# Patient Record
Sex: Male | Born: 1950 | Race: White | Hispanic: No | Marital: Married | Smoking: Current every day smoker
Health system: Southern US, Community
[De-identification: ages and names within clinical notes are randomized; demographics above are authoritative.]

## PROBLEM LIST (undated history)

## (undated) DIAGNOSIS — F32A Depression, unspecified: Secondary | ICD-10-CM

## (undated) DIAGNOSIS — C801 Malignant (primary) neoplasm, unspecified: Secondary | ICD-10-CM

## (undated) DIAGNOSIS — R0602 Shortness of breath: Secondary | ICD-10-CM

## (undated) DIAGNOSIS — D380 Neoplasm of uncertain behavior of larynx: Secondary | ICD-10-CM

## (undated) DIAGNOSIS — E119 Type 2 diabetes mellitus without complications: Secondary | ICD-10-CM

## (undated) DIAGNOSIS — R918 Other nonspecific abnormal finding of lung field: Secondary | ICD-10-CM

## (undated) DIAGNOSIS — T7840XA Allergy, unspecified, initial encounter: Secondary | ICD-10-CM

## (undated) DIAGNOSIS — I10 Essential (primary) hypertension: Secondary | ICD-10-CM

## (undated) DIAGNOSIS — I219 Acute myocardial infarction, unspecified: Secondary | ICD-10-CM

## (undated) DIAGNOSIS — K219 Gastro-esophageal reflux disease without esophagitis: Secondary | ICD-10-CM

## (undated) DIAGNOSIS — G4730 Sleep apnea, unspecified: Secondary | ICD-10-CM

## (undated) DIAGNOSIS — M1990 Unspecified osteoarthritis, unspecified site: Secondary | ICD-10-CM

## (undated) DIAGNOSIS — Z8719 Personal history of other diseases of the digestive system: Secondary | ICD-10-CM

## (undated) DIAGNOSIS — N189 Chronic kidney disease, unspecified: Secondary | ICD-10-CM

## (undated) DIAGNOSIS — G629 Polyneuropathy, unspecified: Secondary | ICD-10-CM

## (undated) DIAGNOSIS — R55 Syncope and collapse: Secondary | ICD-10-CM

## (undated) DIAGNOSIS — Z8489 Family history of other specified conditions: Secondary | ICD-10-CM

## (undated) DIAGNOSIS — R058 Other specified cough: Secondary | ICD-10-CM

## (undated) DIAGNOSIS — E215 Disorder of parathyroid gland, unspecified: Secondary | ICD-10-CM

## (undated) DIAGNOSIS — I209 Angina pectoris, unspecified: Secondary | ICD-10-CM

## (undated) DIAGNOSIS — E669 Obesity, unspecified: Secondary | ICD-10-CM

## (undated) DIAGNOSIS — M549 Dorsalgia, unspecified: Secondary | ICD-10-CM

## (undated) DIAGNOSIS — J40 Bronchitis, not specified as acute or chronic: Secondary | ICD-10-CM

## (undated) DIAGNOSIS — F329 Major depressive disorder, single episode, unspecified: Secondary | ICD-10-CM

## (undated) DIAGNOSIS — G4761 Periodic limb movement disorder: Secondary | ICD-10-CM

## (undated) DIAGNOSIS — D649 Anemia, unspecified: Secondary | ICD-10-CM

## (undated) DIAGNOSIS — J189 Pneumonia, unspecified organism: Secondary | ICD-10-CM

## (undated) DIAGNOSIS — J449 Chronic obstructive pulmonary disease, unspecified: Secondary | ICD-10-CM

## (undated) DIAGNOSIS — G4700 Insomnia, unspecified: Secondary | ICD-10-CM

## (undated) DIAGNOSIS — Z5189 Encounter for other specified aftercare: Secondary | ICD-10-CM

## (undated) DIAGNOSIS — E079 Disorder of thyroid, unspecified: Secondary | ICD-10-CM

## (undated) DIAGNOSIS — I2510 Atherosclerotic heart disease of native coronary artery without angina pectoris: Secondary | ICD-10-CM

## (undated) DIAGNOSIS — IMO0001 Reserved for inherently not codable concepts without codable children: Secondary | ICD-10-CM

## (undated) DIAGNOSIS — R05 Cough: Secondary | ICD-10-CM

## (undated) HISTORY — DX: Sleep apnea, unspecified: G47.30

## (undated) HISTORY — PX: ESOPHAGOGASTRODUODENOSCOPY: SHX1529

## (undated) HISTORY — DX: Anemia, unspecified: D64.9

## (undated) HISTORY — PX: CARPAL TUNNEL RELEASE: SHX101

## (undated) HISTORY — DX: Neoplasm of uncertain behavior of larynx: D38.0

## (undated) HISTORY — DX: Gastro-esophageal reflux disease without esophagitis: K21.9

## (undated) HISTORY — DX: Essential (primary) hypertension: I10

## (undated) HISTORY — DX: Malignant (primary) neoplasm, unspecified: C80.1

## (undated) HISTORY — PX: CORONARY ANGIOPLASTY WITH STENT PLACEMENT: SHX49

## (undated) HISTORY — DX: Major depressive disorder, single episode, unspecified: F32.9

## (undated) HISTORY — PX: ELBOW SURGERY: SHX618

## (undated) HISTORY — DX: Allergy, unspecified, initial encounter: T78.40XA

## (undated) HISTORY — DX: Periodic limb movement disorder: G47.61

## (undated) HISTORY — DX: Obesity, unspecified: E66.9

## (undated) HISTORY — PX: ROTATOR CUFF REPAIR: SHX139

## (undated) HISTORY — DX: Syncope and collapse: R55

## (undated) HISTORY — PX: EYE SURGERY: SHX253

## (undated) HISTORY — PX: INGUINAL HERNIA REPAIR: SUR1180

## (undated) HISTORY — DX: Depression, unspecified: F32.A

## (undated) HISTORY — PX: REFRACTIVE SURGERY: SHX103

## (undated) HISTORY — PX: COLONOSCOPY: SHX174

## (undated) HISTORY — DX: Chronic obstructive pulmonary disease, unspecified: J44.9

## (undated) HISTORY — PX: CARDIAC CATHETERIZATION: SHX172

---

## 2000-12-06 ENCOUNTER — Encounter: Admission: RE | Admit: 2000-12-06 | Discharge: 2000-12-06 | Payer: Self-pay | Admitting: Family Medicine

## 2000-12-06 ENCOUNTER — Encounter: Payer: Self-pay | Admitting: Family Medicine

## 2001-11-10 ENCOUNTER — Encounter: Payer: Self-pay | Admitting: Family Medicine

## 2001-11-10 ENCOUNTER — Encounter: Admission: RE | Admit: 2001-11-10 | Discharge: 2001-11-10 | Payer: Self-pay | Admitting: Family Medicine

## 2003-11-18 ENCOUNTER — Encounter: Admission: RE | Admit: 2003-11-18 | Discharge: 2003-11-18 | Payer: Self-pay | Admitting: Family Medicine

## 2004-05-30 ENCOUNTER — Ambulatory Visit (HOSPITAL_BASED_OUTPATIENT_CLINIC_OR_DEPARTMENT_OTHER): Admission: RE | Admit: 2004-05-30 | Discharge: 2004-05-30 | Payer: Self-pay | Admitting: Orthopedic Surgery

## 2004-05-30 ENCOUNTER — Ambulatory Visit (HOSPITAL_COMMUNITY): Admission: RE | Admit: 2004-05-30 | Discharge: 2004-05-30 | Payer: Self-pay | Admitting: Orthopedic Surgery

## 2004-08-02 ENCOUNTER — Encounter: Admission: RE | Admit: 2004-08-02 | Discharge: 2004-08-02 | Payer: Self-pay | Admitting: Family Medicine

## 2004-08-18 ENCOUNTER — Encounter: Admission: RE | Admit: 2004-08-18 | Discharge: 2004-08-18 | Payer: Self-pay | Admitting: Family Medicine

## 2004-08-21 ENCOUNTER — Ambulatory Visit (HOSPITAL_BASED_OUTPATIENT_CLINIC_OR_DEPARTMENT_OTHER): Admission: RE | Admit: 2004-08-21 | Discharge: 2004-08-21 | Payer: Self-pay | Admitting: Family Medicine

## 2004-09-05 ENCOUNTER — Observation Stay (HOSPITAL_COMMUNITY): Admission: EM | Admit: 2004-09-05 | Discharge: 2004-09-07 | Payer: Self-pay | Admitting: Emergency Medicine

## 2004-12-21 ENCOUNTER — Ambulatory Visit (HOSPITAL_COMMUNITY): Admission: RE | Admit: 2004-12-21 | Discharge: 2004-12-22 | Payer: Self-pay | Admitting: Interventional Cardiology

## 2005-06-14 ENCOUNTER — Encounter (INDEPENDENT_AMBULATORY_CARE_PROVIDER_SITE_OTHER): Payer: Self-pay | Admitting: Specialist

## 2005-06-14 ENCOUNTER — Ambulatory Visit (HOSPITAL_COMMUNITY): Admission: RE | Admit: 2005-06-14 | Discharge: 2005-06-14 | Payer: Self-pay | Admitting: Gastroenterology

## 2005-07-18 ENCOUNTER — Encounter: Admission: RE | Admit: 2005-07-18 | Discharge: 2005-07-18 | Payer: Self-pay | Admitting: Gastroenterology

## 2005-11-03 ENCOUNTER — Encounter: Admission: RE | Admit: 2005-11-03 | Discharge: 2005-11-03 | Payer: Self-pay | Admitting: Orthopedic Surgery

## 2007-04-11 ENCOUNTER — Ambulatory Visit: Payer: Self-pay | Admitting: Cardiology

## 2007-04-11 ENCOUNTER — Observation Stay (HOSPITAL_COMMUNITY): Admission: EM | Admit: 2007-04-11 | Discharge: 2007-04-11 | Payer: Self-pay | Admitting: Emergency Medicine

## 2007-06-16 ENCOUNTER — Emergency Department (HOSPITAL_COMMUNITY): Admission: EM | Admit: 2007-06-16 | Discharge: 2007-06-16 | Payer: Self-pay | Admitting: Emergency Medicine

## 2007-07-02 ENCOUNTER — Ambulatory Visit: Payer: Self-pay | Admitting: Internal Medicine

## 2007-07-02 ENCOUNTER — Encounter: Payer: Self-pay | Admitting: Critical Care Medicine

## 2007-07-11 ENCOUNTER — Encounter: Admission: RE | Admit: 2007-07-11 | Discharge: 2007-07-11 | Payer: Self-pay | Admitting: Gastroenterology

## 2007-07-22 ENCOUNTER — Ambulatory Visit (HOSPITAL_COMMUNITY): Admission: RE | Admit: 2007-07-22 | Discharge: 2007-07-22 | Payer: Self-pay | Admitting: Family Medicine

## 2007-07-31 ENCOUNTER — Ambulatory Visit: Payer: Self-pay | Admitting: Internal Medicine

## 2007-08-05 ENCOUNTER — Ambulatory Visit: Payer: Self-pay | Admitting: Cardiology

## 2007-08-12 ENCOUNTER — Ambulatory Visit: Payer: Self-pay | Admitting: Internal Medicine

## 2007-08-22 DIAGNOSIS — D360 Benign neoplasm of lymph nodes: Secondary | ICD-10-CM

## 2007-08-22 DIAGNOSIS — R0602 Shortness of breath: Secondary | ICD-10-CM | POA: Insufficient documentation

## 2007-08-22 DIAGNOSIS — J449 Chronic obstructive pulmonary disease, unspecified: Secondary | ICD-10-CM

## 2007-09-25 DIAGNOSIS — R42 Dizziness and giddiness: Secondary | ICD-10-CM

## 2007-09-25 DIAGNOSIS — F17200 Nicotine dependence, unspecified, uncomplicated: Secondary | ICD-10-CM

## 2007-11-13 ENCOUNTER — Inpatient Hospital Stay (HOSPITAL_COMMUNITY): Admission: EM | Admit: 2007-11-13 | Discharge: 2007-11-15 | Payer: Self-pay | Admitting: Emergency Medicine

## 2008-06-14 ENCOUNTER — Observation Stay (HOSPITAL_COMMUNITY): Admission: EM | Admit: 2008-06-14 | Discharge: 2008-06-15 | Payer: Self-pay | Admitting: Emergency Medicine

## 2008-06-14 ENCOUNTER — Ambulatory Visit: Payer: Self-pay | Admitting: Internal Medicine

## 2008-12-19 ENCOUNTER — Inpatient Hospital Stay (HOSPITAL_COMMUNITY): Admission: EM | Admit: 2008-12-19 | Discharge: 2008-12-20 | Payer: Self-pay | Admitting: Emergency Medicine

## 2008-12-19 ENCOUNTER — Ambulatory Visit: Payer: Self-pay | Admitting: Internal Medicine

## 2008-12-20 ENCOUNTER — Encounter (INDEPENDENT_AMBULATORY_CARE_PROVIDER_SITE_OTHER): Payer: Self-pay | Admitting: Interventional Cardiology

## 2009-01-05 ENCOUNTER — Inpatient Hospital Stay (HOSPITAL_COMMUNITY): Admission: EM | Admit: 2009-01-05 | Discharge: 2009-01-08 | Payer: Self-pay | Admitting: Emergency Medicine

## 2009-01-08 ENCOUNTER — Telehealth: Payer: Self-pay | Admitting: Internal Medicine

## 2009-01-11 ENCOUNTER — Telehealth (INDEPENDENT_AMBULATORY_CARE_PROVIDER_SITE_OTHER): Payer: Self-pay | Admitting: *Deleted

## 2009-02-01 ENCOUNTER — Emergency Department (HOSPITAL_COMMUNITY): Admission: EM | Admit: 2009-02-01 | Discharge: 2009-02-01 | Payer: Self-pay | Admitting: Emergency Medicine

## 2009-02-15 ENCOUNTER — Ambulatory Visit: Payer: Self-pay | Admitting: Internal Medicine

## 2009-02-15 DIAGNOSIS — J180 Bronchopneumonia, unspecified organism: Secondary | ICD-10-CM | POA: Insufficient documentation

## 2009-03-24 ENCOUNTER — Encounter (HOSPITAL_COMMUNITY): Admission: RE | Admit: 2009-03-24 | Discharge: 2009-06-22 | Payer: Self-pay | Admitting: Internal Medicine

## 2009-05-24 ENCOUNTER — Encounter: Admission: RE | Admit: 2009-05-24 | Discharge: 2009-05-24 | Payer: Self-pay | Admitting: Orthopedic Surgery

## 2009-06-23 ENCOUNTER — Encounter (HOSPITAL_COMMUNITY): Admission: RE | Admit: 2009-06-23 | Discharge: 2009-08-05 | Payer: Self-pay | Admitting: Internal Medicine

## 2009-07-19 ENCOUNTER — Ambulatory Visit: Payer: Self-pay | Admitting: Internal Medicine

## 2009-07-19 LAB — CONVERTED CEMR LAB
BUN: 14 mg/dL (ref 6–23)
Calcium: 9.1 mg/dL (ref 8.4–10.5)
Creatinine, Ser: 0.8 mg/dL (ref 0.4–1.5)
GFR calc non Af Amer: 105.04 mL/min (ref >60)
Glucose, Bld: 146 mg/dL — ABNORMAL HIGH (ref 70–99)

## 2009-07-26 ENCOUNTER — Ambulatory Visit: Payer: Self-pay | Admitting: Internal Medicine

## 2009-08-06 ENCOUNTER — Encounter (HOSPITAL_COMMUNITY): Admission: RE | Admit: 2009-08-06 | Discharge: 2009-11-04 | Payer: Self-pay | Admitting: Interventional Cardiology

## 2009-08-09 ENCOUNTER — Ambulatory Visit: Payer: Self-pay | Admitting: Internal Medicine

## 2009-08-17 ENCOUNTER — Telehealth (INDEPENDENT_AMBULATORY_CARE_PROVIDER_SITE_OTHER): Payer: Self-pay | Admitting: *Deleted

## 2009-11-29 ENCOUNTER — Ambulatory Visit: Payer: Self-pay | Admitting: Internal Medicine

## 2009-12-29 ENCOUNTER — Ambulatory Visit: Payer: Self-pay | Admitting: Internal Medicine

## 2010-02-10 ENCOUNTER — Ambulatory Visit: Payer: Self-pay | Admitting: Internal Medicine

## 2010-02-10 DIAGNOSIS — R1013 Epigastric pain: Secondary | ICD-10-CM | POA: Insufficient documentation

## 2010-03-03 ENCOUNTER — Ambulatory Visit: Payer: Self-pay | Admitting: Internal Medicine

## 2010-04-04 ENCOUNTER — Telehealth: Payer: Self-pay | Admitting: Internal Medicine

## 2010-05-15 ENCOUNTER — Ambulatory Visit: Payer: Self-pay | Admitting: Cardiology

## 2010-06-20 ENCOUNTER — Ambulatory Visit: Payer: Self-pay | Admitting: Internal Medicine

## 2010-06-26 ENCOUNTER — Encounter: Payer: Self-pay | Admitting: Internal Medicine

## 2010-07-07 ENCOUNTER — Encounter: Payer: Self-pay | Admitting: Internal Medicine

## 2010-11-16 ENCOUNTER — Observation Stay (HOSPITAL_COMMUNITY)
Admission: EM | Admit: 2010-11-16 | Discharge: 2010-11-19 | Payer: Self-pay | Source: Home / Self Care | Attending: Internal Medicine | Admitting: Internal Medicine

## 2010-11-20 LAB — POCT CARDIAC MARKERS
CKMB, poc: 1.8 ng/mL (ref 1.0–8.0)
CKMB, poc: 3.2 ng/mL (ref 1.0–8.0)
Myoglobin, poc: 230 ng/mL (ref 12–200)
Myoglobin, poc: 124 ng/mL (ref 12–200)
Troponin i, poc: <0.05 ng/mL (ref 0.00–0.09)
Troponin i, poc: <0.05 ng/mL (ref 0.00–0.09)

## 2010-11-20 LAB — COMPREHENSIVE METABOLIC PANEL
ALT: 21 U/L (ref 0–53)
AST: 29 U/L (ref 0–37)
Albumin: 3.6 g/dL (ref 3.5–5.2)
Alkaline Phosphatase: 64 U/L (ref 39–117)
BUN: 16 mg/dL (ref 6–23)
CO2: 20 mEq/L (ref 19–32)
Calcium: 8.6 mg/dL (ref 8.4–10.5)
Chloride: 100 mEq/L (ref 96–112)
Creatinine, Ser: 1.12 mg/dL (ref 0.4–1.5)
GFR calc Af Amer: >60 mL/min (ref >60)
GFR calc non Af Amer: >60 mL/min (ref >60)
Glucose, Bld: 299 mg/dL — ABNORMAL HIGH (ref 70–99)
Potassium: 4.4 mEq/L (ref 3.5–5.1)
Sodium: 137 mEq/L (ref 135–145)
Total Bilirubin: 0.4 mg/dL (ref 0.3–1.2)
Total Protein: 6.4 g/dL (ref 6.0–8.3)

## 2010-11-20 LAB — GLUCOSE, CAPILLARY
Glucose-Capillary: 231 mg/dL — ABNORMAL HIGH (ref 70–99)
Glucose-Capillary: 291 mg/dL — ABNORMAL HIGH (ref 70–99)
Glucose-Capillary: 324 mg/dL — ABNORMAL HIGH (ref 70–99)
Glucose-Capillary: 339 mg/dL — ABNORMAL HIGH (ref 70–99)
Glucose-Capillary: 359 mg/dL — ABNORMAL HIGH (ref 70–99)
Glucose-Capillary: 399 mg/dL — ABNORMAL HIGH (ref 70–99)
Glucose-Capillary: 423 mg/dL — ABNORMAL HIGH (ref 70–99)
Glucose-Capillary: 496 mg/dL — ABNORMAL HIGH (ref 70–99)
Glucose-Capillary: 189 mg/dL — ABNORMAL HIGH (ref 70–99)
Glucose-Capillary: 284 mg/dL — ABNORMAL HIGH (ref 70–99)
Glucose-Capillary: 334 mg/dL — ABNORMAL HIGH (ref 70–99)
Glucose-Capillary: 411 mg/dL — ABNORMAL HIGH (ref 70–99)
Glucose-Capillary: 486 mg/dL — ABNORMAL HIGH (ref 70–99)

## 2010-11-20 LAB — DIFFERENTIAL
Basophils Absolute: 0 10*3/uL (ref 0.0–0.1)
Basophils Absolute: 0 10*3/uL (ref 0.0–0.1)
Basophils Relative: 1 % (ref 0–1)
Basophils Relative: 0 % (ref 0–1)
Eosinophils Absolute: 0.3 10*3/uL (ref 0.0–0.7)
Eosinophils Absolute: 0 10*3/uL (ref 0.0–0.7)
Eosinophils Relative: 5 % (ref 0–5)
Eosinophils Relative: 0 % (ref 0–5)
Lymphocytes Relative: 23 % (ref 12–46)
Lymphocytes Relative: 9 % — ABNORMAL LOW (ref 12–46)
Lymphs Abs: 1.7 10*3/uL (ref 0.7–4.0)
Lymphs Abs: 0.6 10*3/uL — ABNORMAL LOW (ref 0.7–4.0)
Monocytes Absolute: 0.6 10*3/uL (ref 0.1–1.0)
Monocytes Absolute: 0.1 10*3/uL (ref 0.1–1.0)
Monocytes Relative: 8 % (ref 3–12)
Monocytes Relative: 1 % — ABNORMAL LOW (ref 3–12)
Neutro Abs: 4.6 10*3/uL (ref 1.7–7.7)
Neutro Abs: 5.9 10*3/uL (ref 1.7–7.7)
Neutrophils Relative %: 64 % (ref 43–77)
Neutrophils Relative %: 90 % — ABNORMAL HIGH (ref 43–77)

## 2010-11-20 LAB — CARDIAC PANEL(CRET KIN+CKTOT+MB+TROPI)
CK, MB: 3.9 ng/mL (ref 0.3–4.0)
CK, MB: 4.4 ng/mL — ABNORMAL HIGH (ref 0.3–4.0)
Relative Index: 2.3 (ref 0.0–2.5)
Relative Index: 2.3 (ref 0.0–2.5)
Total CK: 171 U/L (ref 7–232)
Total CK: 195 U/L (ref 7–232)
Troponin I: 0.01 ng/mL (ref 0.00–0.06)
Troponin I: 0.02 ng/mL (ref 0.00–0.06)

## 2010-11-20 LAB — BASIC METABOLIC PANEL
BUN: 14 mg/dL (ref 6–23)
BUN: 28 mg/dL — ABNORMAL HIGH (ref 6–23)
CO2: 22 mEq/L (ref 19–32)
CO2: 25 mEq/L (ref 19–32)
Calcium: 8.6 mg/dL (ref 8.4–10.5)
Calcium: 9.1 mg/dL (ref 8.4–10.5)
Chloride: 101 mEq/L (ref 96–112)
Chloride: 96 mEq/L (ref 96–112)
Creatinine, Ser: 0.92 mg/dL (ref 0.4–1.5)
Creatinine, Ser: 1.13 mg/dL (ref 0.4–1.5)
GFR calc Af Amer: >60 mL/min (ref >60)
GFR calc Af Amer: >60 mL/min (ref >60)
GFR calc non Af Amer: >60 mL/min (ref >60)
GFR calc non Af Amer: >60 mL/min (ref >60)
Glucose, Bld: 148 mg/dL — ABNORMAL HIGH (ref 70–99)
Glucose, Bld: 398 mg/dL — ABNORMAL HIGH (ref 70–99)
Potassium: 3.4 mEq/L — ABNORMAL LOW (ref 3.5–5.1)
Potassium: 4.9 mEq/L (ref 3.5–5.1)
Sodium: 136 mEq/L (ref 135–145)
Sodium: 131 mEq/L — ABNORMAL LOW (ref 135–145)

## 2010-11-20 LAB — CBC
HCT: 39.2 % (ref 39.0–52.0)
HCT: 38.1 % — ABNORMAL LOW (ref 39.0–52.0)
Hemoglobin: 13.6 g/dL (ref 13.0–17.0)
Hemoglobin: 13 g/dL (ref 13.0–17.0)
MCH: 30.8 pg (ref 26.0–34.0)
MCH: 30.3 pg (ref 26.0–34.0)
MCHC: 34.7 g/dL (ref 30.0–36.0)
MCHC: 34.1 g/dL (ref 30.0–36.0)
MCV: 88.7 fL (ref 78.0–100.0)
MCV: 88.8 fL (ref 78.0–100.0)
Platelets: 174 10*3/uL (ref 150–400)
Platelets: 182 10*3/uL (ref 150–400)
RBC: 4.42 MIL/uL (ref 4.22–5.81)
RBC: 4.29 MIL/uL (ref 4.22–5.81)
RDW: 13.4 % (ref 11.5–15.5)
RDW: 13.9 % (ref 11.5–15.5)
WBC: 7.2 10*3/uL (ref 4.0–10.5)
WBC: 6.5 10*3/uL (ref 4.0–10.5)

## 2010-11-20 LAB — HEPATIC FUNCTION PANEL
ALT: 21 U/L (ref 0–53)
AST: 23 U/L (ref 0–37)
Albumin: 3.7 g/dL (ref 3.5–5.2)
Alkaline Phosphatase: 66 U/L (ref 39–117)
Bilirubin, Direct: 0.1 mg/dL (ref 0.0–0.3)
Indirect Bilirubin: 0.1 mg/dL — ABNORMAL LOW (ref 0.3–0.9)
Total Bilirubin: 0.2 mg/dL — ABNORMAL LOW (ref 0.3–1.2)
Total Protein: 6.4 g/dL (ref 6.0–8.3)

## 2010-11-20 LAB — HEMOGLOBIN A1C
Hgb A1c MFr Bld: 9.9 % — ABNORMAL HIGH (ref <5.7)
Hgb A1c MFr Bld: 9.8 % — ABNORMAL HIGH (ref <5.7)
Mean Plasma Glucose: 237 mg/dL — ABNORMAL HIGH (ref <117)
Mean Plasma Glucose: 235 mg/dL — ABNORMAL HIGH (ref <117)

## 2010-11-20 LAB — URINALYSIS, ROUTINE W REFLEX MICROSCOPIC
Bilirubin Urine: NEGATIVE
Hgb urine dipstick: NEGATIVE
Ketones, ur: NEGATIVE mg/dL
Nitrite: NEGATIVE
Protein, ur: NEGATIVE mg/dL
Specific Gravity, Urine: 1.01 (ref 1.005–1.030)
Urine Glucose, Fasting: NEGATIVE mg/dL
Urobilinogen, UA: 0.2 mg/dL (ref 0.0–1.0)
pH: 6 (ref 5.0–8.0)

## 2010-11-20 LAB — CK TOTAL AND CKMB (NOT AT ARMC)
CK, MB: 4 ng/mL (ref 0.3–4.0)
Relative Index: 1.9 (ref 0.0–2.5)
Total CK: 206 U/L (ref 7–232)

## 2010-11-20 LAB — LIPID PANEL
Cholesterol: 151 mg/dL (ref 0–200)
HDL: 31 mg/dL — ABNORMAL LOW (ref >39)
LDL Cholesterol: 73 mg/dL (ref 0–99)
Total CHOL/HDL Ratio: 4.9 RATIO
Triglycerides: 234 mg/dL — ABNORMAL HIGH (ref <150)
VLDL: 47 mg/dL — ABNORMAL HIGH (ref 0–40)

## 2010-11-20 LAB — RAPID URINE DRUG SCREEN, HOSP PERFORMED
Amphetamines: NOT DETECTED
Barbiturates: NOT DETECTED
Benzodiazepines: NOT DETECTED
Cocaine: NOT DETECTED
Opiates: POSITIVE — AB
Tetrahydrocannabinol: NOT DETECTED

## 2010-11-20 LAB — ETHANOL: Alcohol, Ethyl (B): 241 mg/dL — ABNORMAL HIGH (ref 0–10)

## 2010-11-20 LAB — BRAIN NATRIURETIC PEPTIDE: Pro B Natriuretic peptide (BNP): 87 pg/mL (ref 0.0–100.0)

## 2010-11-20 LAB — TROPONIN I: Troponin I: <0.01 ng/mL (ref 0.00–0.06)

## 2010-12-07 NOTE — Assessment & Plan Note (Signed)
Summary: rov 6 wks///kp   Visit Type:  Follow-up Copy to:  [facility] Primary Provider/Referring Provider:  [facility]  CC:  Pt here for follow-up. Pt states wellbutrin kept him awake at night. He also staets nasal steroid helped nasal drnaiage and but he still has a cough. .  History of Present Illness: OV 02/10/2010: Followup tobacco abuse, copd, mediastinal nodes, RUL pulmonary nodule, obesity/osa on cpap. Main reason for visit is smoking counselling and cough followup. Still smoking. intolerant to Wellbutrin due to insomnia. Still couging despite change of lisinpril to benicar last visit in FEb 2011 and starting nasal inhaler samples. States that nasal drainage is cleared but still coughing. WE realize today that he has been on ATENOLOL for over 6 months due to tachycardia at re[facility]b. New symptoms is epigastric pain. Pain is present  randomly. Insidious onset 2 weeks ago. Progresive. Occurs at rest but typically wiht exertion and relieved by rest. Moderate in intensity. Denies radiation. No associatd fever, chills, edema  Of note, he is very confused about his medication list. We also realize he is on asmanex something he has not revealed to [place] in past.   Current Medications (verified): 1)  Ranitidine Hcl 300 Mg  Tabs (Ranitidine Hcl) .... At Bedtime 2)  Vytorin 10-80 Mg Tabs (Ezetimibe-Simvastatin) .... Take 1 Tablet By Mouth Once A Day 3)  Fluoxetine Hcl 20 Mg  Tabs (Fluoxetine Hcl) .... 3 Q Am Than 1 Q Pm 4)  Metformin Hcl 1000 Mg  Tabs (Metformin Hcl) .... Take 1 Tablet By Mouth Two Times A Day 5)  Foradil Aerolizer 12 Mcg  Caps (Formoterol Fumarate) .Marland Kitchen.. 1 Cap Two Times A Day 6)  Spiriva Handihaler 18 Mcg  Caps (Tiotropium Bromide Monohydrate) .Marland Kitchen.. 1 Cap Once Daily 7)  Albuterol Sulfate (2.5 Mg/41ml) 0.083% Nebu (Albuterol Sulfate) .... Two Times A Day 8)  Lantus 100 Unit/ml Soln (Insulin Glargine) .... 70 Units Sub Q Once Daily in Am 9)  Asmanex 120 Metered Doses 220 Mcg/inh  Aepb (Mometasone Furoate) .... Once Daily 10)  Atenolol 25 Mg Tabs (Atenolol) .... Take 1 Tablet By Mouth Once A Day 11)  Isosorbide Mononitrate Cr 60 Mg Xr24h-Tab (Isosorbide Mononitrate) .... Take 1 Tablet By Mouth Once A Day 12)  Benicar Hct 20-12.5 Mg Tabs (Olmesartan Medoxomil-Hctz) .... Take 1 Tablet By Mouth Once A Day  Allergies (verified): No Known Drug Allergies  Past History:  Risk Factors: Smoking Status: quit (02/15/2009)  Past Medical History: # COPD > Isolated low DLCO. 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 #. 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 TOBACCO ABUSE - was on  Chantix. 2008.  quit smoking 01/2009, relapsed Oct 2010. Intolerant to chantix - intolerant to Wellbutrin April 2011  # Chronic dyspnea, etiology is chronic obstructive pulmonary disease, obesity, and e deconditioning. #. Long-standing dizziness, neurologist evaluation pending. #. Stable mediastinal nodes Oct 2008 -> Sept 2010.  #. Obesity/OSA -> on cpap #CAD.Marland KitchenMarland KitchenMarland KitchenDr Garnette Scheuermann (updated 02/10/2010) > Started on atenolol fall 2010 or so for tachycardia -> changed to bystolic April 2011 due to cough >s/p co. stenting > per hx last stress test 2005 negative  Family History: Reviewed history and no changes required.  Social History: Reviewed history and no changes required. smokes  Review of Systems       The patient complains of shortness of breath with activity and productive cough.  The patient denies shortness of  breath at rest, non-productive cough, coughing up blood, chest pain, irregular heartbeats, acid heartburn, indigestion, loss of appetite, weight change, abdominal pain, difficulty swallowing, sore throat, tooth/dental problems, headaches, nasal congestion/difficulty breathing through nose, sneezing, itching, ear ache, anxiety, depression, hand/feet swelling, joint stiffness or pain, rash, change in color of mucus, and  fever.    Vital Signs:  Patient profile:   60 year old male Height:      71 inches Weight:      282.38 pounds O2 Sat:      95 % on Room air Temp:     97.3 degrees F oral Pulse rate:   82 / minute BP sitting:   124 / 52  (right arm) Cuff size:   regular  Vitals Entered By: Carron Curie CMA (February 10, 2010 9:06 AM)  O2 Flow:  Room air CC: Pt here for follow-up. Pt states wellbutrin kept him awake at night. He also staets nasal steroid helped nasal drnaiage, but he still has a cough.  Comments Medications reviewed with patient Carron Curie CMA  February 10, 2010 9:11 AM Daytime phone number verified with patient.   Physical Exam  General:  well developed, well nourished, in no acute distressobese.  tobacco smell + Head:  normocephalic and atraumatic Eyes:  PERRLA/EOM intact; conjunctiva and sclera clear Ears:  TMs intact and clear with normal canals Nose:  no deformity, discharge, inflammation, or lesions Mouth:  no deformity or lesions Neck:  no masses, thyromegaly, or abnormal cervical nodes Chest Wall:  no deformities noted Lungs:  decreased BS bilateral and prolonged exhilation.   Heart:  regular rate and rhythm, S1, S2 without murmurs, rubs, gallops, or clicks Abdomen:  bowel sounds positive; abdomen soft and non-tender without masses, or organomegaly  EPIGASTRIC TENDERNESS + Msk:  no deformity or scoliosis noted with normal posture Pulses:  pulses normal Extremities:  no clubbing, cyanosis, edema, or deformity noted Neurologic:  CN II-XII grossly intact with normal reflexes, coordination, muscle strength and tone Skin:  intact without lesions or rashes Cervical Nodes:  no significant adenopathy Axillary Nodes:  no significant adenopathy Psych:  alert and cooperative; normal mood and affect; normal attention span and concentration   Impression & Recommendations:  Problem # 1:  EPIGASTRIC PAIN (ICD-789.06) Assessment New  I am not sure if this is from GERD  or angina variant. HE  has epigastric tenderness but otherwise negative abdominal exam. He has known CAD and this pain is exertional  PLAN start PPI zegerid - will help with GERD cough as well refer to [facility] Cards today - has appt at 1.30am  Orders: Est. Patient Level V (40102)  Problem # 2:  PULMONARY NODULE (ICD-518.89) Assessment: Unchanged  New RUL 5mm nodule seen Sept 2010 CT chest  plan repeat ct chest 9 months in July 2011  Orders: Radiology Referral (Radiology) Est. Patient Level V [id number])  Problem # 3:  CIGARETTE SMOKER (ICD-305.1) Assessment: Unchanged  quit 01/2009 but relaposed end 07/2009. SMoking more in Jan 2011. STarted nicotine patch JAn 2011. This Dec 29, 2009 visit smoking only 5 cig/day. WEll butrin started. THis april visit intolerant to wellbutrin due to insonnia. He will quit on his own  plan quit smoking  counselling  Orders: Est. 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 Clinically well now.  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  > repeat ct chest 12/2008 some nodes are measured at 1.4cm. > Next ct scan in 01/2009 but he had pna at this time and size is similar.  >CT 07/2009 - nodes unchnaged comapred to 2008  plan no further followup  Problem # 6:  C O P D (ICD-496) Assessment: Unchanged stable  plan continue asmanex, spiriva and foradil visit tammy for med calendar  Problem # 7:  COUGH, CHRONIC (ICD-786.2) Assessment: Unchanged No improvment despite sinus drainage control and coming off ACE inhiitors  plan continue copd Rx continue benicar change atenolol to bystolic (samples given) start PPI for possible GERD ROV 4 weeks with NP for med calendar  Medications Added to Medication  List This Visit: 1)  Lantus 100 Unit/ml Soln (Insulin glargine) .... 70 units sub q once daily in am 2)  Asmanex 120 Metered Doses 220 Mcg/inh Aepb (Mometasone furoate) .... Once daily 3)  Atenolol 25 Mg Tabs (Atenolol) .... Take 1 tablet by mouth once a day 4)  Bystolic 5 Mg Tabs (Nebivolol hcl) .... One tablet daily 5)  Isosorbide Mononitrate Cr 60 Mg Xr24h-tab (Isosorbide mononitrate) .... Take 1 tablet by mouth once a day 6)  Benicar Hct 20-12.5 Mg Tabs (Olmesartan medoxomil-hctz) .... Take 1 tablet by mouth once a day 7)  Zegerid Otc 20-1100 Mg Caps (Omeprazole-sodium bicarbonate) .Marland Kitchen.. 1 capsule early morning before breakfast  Patient Instructions: 1)  stop atenolol 2)  take 45 day sample of 5mg  bystolic instead 3)  continue benicar as before 4)  contnue asmanex, foradil and spiriva 5)  try to quit smoking 6)  star zegerid OTC daily before breakfast daily for acidity 7)  see Dr. Garnette Scheuermann soon for chest pain/acid reflux 8)  see Tammy Parret in 3 weeks to report progress and med calendar 9)  you need CT chest in July 2011 - sorry forgot to tell you that Prescriptions: ZEGERID OTC 20-1100 MG CAPS (OMEPRAZOLE-SODIUM BICARBONATE) 1 capsule early morning before breakfast  #30 x 6   Entered and Authorized by:   Kalman Shan MD   Signed by:   Kalman Shan MD on 02/10/2010   Method used:   Electronically to        [facility] [phone]* (retail)       [address]       [place], [place]  [address]       Ph: [phone]       Fax: [phone]   RxID:   [id number] BYSTOLIC 5 MG  TABS (NEBIVOLOL HCL) One tablet daily  #30 x 1   Entered and Authorized by:   Kalman Shan MD   Signed by:   Kalman Shan MD on 02/10/2010   Method used:   Electronically to        [facility] [phone]* (retail)       [address]       [place], [place]  [address]       Ph: [phone]       Fax: [phone]   RxID:   [id number]

## 2010-12-07 NOTE — Medication Information (Signed)
Summary: [facility]  [facility]   Imported By: Lester [place] 07/14/2010 08:26:59  _____________________________________________________________________  External Attachment:    Type:   Image     Comment:   External Document

## 2010-12-07 NOTE — Assessment & Plan Note (Signed)
Summary: 1 month/apc   Visit Type:  Follow-up Copy to:  [facility] Primary Provider/Referring Provider:  [facility]  CC:  Pt here for follow-up. pt states breahting is ok and but c/o increased productive cough with white phlegm.  Pt c/o mouth dryness at night. .  History of Present Illness: OV 11/29/2009: Followup tobacco abuse, copd, mediastinal nodes, RUL pulmonary nodule, obesity/osa on cpap. Main focus today's visit is smoking. He relapsed in Oct 2010 and now is smoking more. Smokes 1 pack a day. Wans to quit. Feels he does not hae will power. Wife has quit. Did not tolerate chantix in past due to bad dreams. Has failed patch. Willing to try wellbutrin. IN terms of copd, symptoms  of cough and dyspnea are stable. There are no new complaints.   OV 12/29/2009: Followup tobacco abuse, copd, mediastinal nodes, RUL pulmonary nodule, obesity/osa on cpap. Main focus is about quit smoking effforts. BUt he is c/o increased cough with white sputum x 2 weeks. Cough present all day  and is associated with white mucus and ACE inhibitor intake and sinus drainage. No associated wheeze, dyspnea, sputum, fever, chills or increased inhaler use. No clear cut aggravaiting and relieving factors. Insidious worsening. Stable since onset. In terms of smoking, has cut down to 5-6cig/day but unable to wean further. Is on patch. Wants zyban.   Current Medications (verified): 1)  Ranitidine Hcl 300 Mg  Tabs (Ranitidine Hcl) .... At Bedtime 2)  Vytorin 10-80 Mg Tabs (Ezetimibe-Simvastatin) .... Take 1 Tablet By Mouth Once A Day 3)  Fluoxetine Hcl 20 Mg  Tabs (Fluoxetine Hcl) .... 3 Q Am Than 1 Q Pm 4)  Lisinopril 10 Mg  Tabs (Lisinopril) .... Take 1 Tablet By Mouth Once A Day 5)  Metformin Hcl 1000 Mg  Tabs (Metformin Hcl) .... Take 1 Tablet By Mouth Two Times A Day 6)  Foradil Aerolizer 12 Mcg  Caps (Formoterol Fumarate) .Marland Kitchen.. 1 Cap Two Times A Day 7)  Spiriva Handihaler 18 Mcg  Caps (Tiotropium Bromide  Monohydrate) .Marland Kitchen.. 1 Cap Once Daily 8)  Albuterol Sulfate (2.5 Mg/22ml) 0.083% Nebu (Albuterol Sulfate) .... Two Times A Day  Allergies (verified): No Known Drug Allergies  Past History:  Risk Factors: Smoking Status: quit (02/15/2009)  Past Medical History: Reviewed history from 11/29/2009 and no changes required. # COPD > Isolated low DLCO. 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 #. 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 TOBACCO ABUSE - was on  Chantix. 2008 -> quit smoking 01/2009, relapsed Oct 2010 # Chronic dyspnea, etiology is chronic obstructive pulmonary disease, obesity, and e deconditioning. #. Long-standing dizziness, neurologist evaluation pending. #. Stable mediastinal nodes Oct 2008 -> Sept 2010.  #. Obesity/OSA -> on cpap  Family History: Reviewed history and no changes required.  Social History: Reviewed history and no changes required.  Review of Systems       The patient complains of productive cough.  The patient denies shortness of breath with activity, shortness of breath at rest, non-productive cough, coughing up blood, chest pain, irregular heartbeats, acid heartburn, indigestion, loss of appetite, weight change, abdominal pain, difficulty swallowing, sore throat, tooth/dental problems, headaches, nasal congestion/difficulty breathing through nose, sneezing, itching, ear ache, anxiety, depression, hand/feet swelling, joint stiffness or pain, rash, change in color of mucus, and fever.         dry mouth  Vital Signs:  Patient profile:   60  year old male Height:      71 inches Weight:      295 pounds O2 Sat:      92 % on Room air Temp:     98.0 degrees F oral Pulse rate:   89 / minute BP sitting:   128 / 82  (right arm) Cuff size:   regular  Vitals Entered By: Carron Curie CMA (December 29, 2009 1:46 PM)  O2 Flow:  Room air CC: Pt here for follow-up. pt states breahting is  ok, but c/o increased productive cough with white phlegm.  Pt c/o mouth dryness at night.  Comments Medications reviewed with patient Carron Curie CMA  December 29, 2009 1:46 PM Daytime phone number verified with patient.    Physical Exam  General:  well developed, well nourished, in no acute distressobese.  does not smell of tobacco this time Head:  normocephalic and atraumatic Eyes:  PERRLA/EOM intact; conjunctiva and sclera clear Ears:  TMs intact and clear with normal canals Nose:  no deformity, discharge, inflammation, or lesions Mouth:  no deformity or lesions Neck:  no masses, thyromegaly, or abnormal cervical nodes Chest Wall:  no deformities noted Lungs:  clear bilaterally to auscultation and percussioncoarse BS throughout.   Heart:  regular rate and rhythm, S1, S2 without murmurs, rubs, gallops, or clicks Abdomen:  bowel sounds positive; abdomen soft and non-tender without masses, or organomegaly Msk:  no deformity or scoliosis noted with normal posture Pulses:  pulses normal Extremities:  no clubbing, cyanosis, edema, or deformity noted Neurologic:  CN II-XII grossly intact with normal reflexes, coordination, muscle strength and tone Skin:  intact without lesions or rashes Cervical Nodes:  no significant adenopathy Axillary Nodes:  no significant adenopathy Psych:  alert and cooperative; normal mood and affect; normal attention span and concentration   Impression & Recommendations:  Problem # 1:  COUGH, CHRONIC (ICD-786.2) Assessment Deteriorated  I think worsening cough is most likely due to aCE inhibor intake and sinus drainage. There are no other features of AECOPD  PLAN Change lisinopril to benicar ( 6week samples of benicar given) nasal steroid samples given for sinus drainage monitor  Orders: Est. Patient Level IV (16109)  Problem # 2:  CIGARETTE SMOKER (ICD-305.1) Assessment: Improved  quit 01/2009 but relaposed end 07/2009. SMoking more in Jan  2011. STarted nicotine patch JAn 2011. This Dec 29, 2009 visit smoking only 5 cig/day  plan quit smoking continue nicotine patch add wellbutrin monitir  5 minutes counselling face tof ace  Orders: Est. 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 Clinically well now.  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  > repeat ct chest 12/2008 some nodes are measured at 1.4cm. > Next ct scan in 01/2009 but he had pna at this time and size is similar.  >CT 07/2009 - nodes unchnaged comapred to 2008  plan no further followup  Problem # 5:  C O P D (ICD-496) Assessment: Unchanged stabl disese  plan continue mdi previously ordered  Medications Added to Medication List This Visit: 1)  Vytorin 10-80 Mg Tabs (Ezetimibe-simvastatin) .... Take 1 tablet by mouth once a day 2)  Benicar Hct 20-12.5 Mg Tabs (Olmesartan medoxomil-hctz) .... One tablet by mouth daily  3)  Albuterol Sulfate (2.5 Mg/35ml) 0.083% Nebu (Albuterol sulfate) .... Two times a day 4)  Nasacort Aq 55 Mcg/act Aers (Triamcinolone acetonide(nasal)) .... Two puffs each nostril daily 5)  Wellbutrin Xl 150 Mg Xr24h-tab (Bupropion hcl) .... Take 1 tablet daily for 5 days 6)  Wellbutrin Xl 300 Mg Xr24h-tab (Bupropion hcl) .... Take one tablet daily after finishing 5 days of the lower dose  Patient Instructions: 1)  please take nasal sterpoid samples (2)  2)  use above for 1 month 3)  if this is not helping in few weeks with sinus drainge, try otc claritin  or something similar 4)  you need to stop lisinopril - this causes cough 5)  continue nicotine pacth 6)  we will start wellbutrin for quitting smoking - take low dose 1 a day for 5  days and then higher dose one daily 7)  quit smoking 1-2 weeks after starting wellbutrin 8)  return in 6 weeks to report progress on cough/smoking cessation Prescriptions: BENICAR HCT 20-12.5 MG  TABS (OLMESARTAN MEDOXOMIL-HCTZ) One tablet by mouth daily  #30 x 1   Entered and Authorized by:   Kalman Shan MD   Signed by:   Kalman Shan MD on 12/29/2009   Method used:   Electronically to        [facility] [phone]* (retail)       [address]       [place], [place]  [address]       Ph: [phone]       Fax: [phone]   RxID:   [id number] WELLBUTRIN XL 300 MG XR24H-TAB (BUPROPION HCL) take one tablet daily after finishing 5 days of the lower dose  #30 x 1   Entered and Authorized by:   Kalman Shan MD   Signed by:   Kalman Shan MD on 12/29/2009   Method used:   Electronically to        [facility] [phone]* (retail)       [address]       [place], [place]  [address]       Ph: [phone]       Fax: [phone]   RxID:   [id number] WELLBUTRIN XL 150 MG XR24H-TAB (BUPROPION HCL) take 1 tablet daily for 5 days  #5 x 0   Entered and Authorized by:   Kalman Shan MD   Signed by:   Kalman Shan MD on 12/29/2009   Method used:   Electronically to        [facility] [phone]* (retail)       [address]       [place], [place]  [address]       Ph: [phone]       Fax: [phone]   RxID:   [id number] NASACORT AQ 55 MCG/ACT  AERS (TRIAMCINOLONE ACETONIDE(NASAL)) Two puffs each nostril daily  #1 x 6   Entered and Authorized by:   Kalman Shan MD   Signed by:   Kalman Shan MD on 12/29/2009   Method used:   Electronically to        [facility] [phone]* (retail)       [address]       [place], [place]  [address]       Ph: [phone]       Fax: [phone]   RxID:   [id number]

## 2010-12-07 NOTE — Assessment & Plan Note (Signed)
Summary: 3 months/apc   Visit Type:  Follow-up Copy to:  [facility] Primary Provider/Referring Provider:  [facility]  CC:  [name] here for follow-up. [name] states he stopped smoking but has recently strated back. [name] has completed pulmonary rehab. [name] states breathing is good but c/o occs productive cough with clear phlegm. Marland Kitchen  History of Present Illness: OV 11/29/2009: Followup tobacco abuse, copd, mediastinal nodes, RUL pulmonary nodule, obesity/osa on cpap. Main focus today's visit is smoking. He relapsed in Oct 2010 and now is smoking more. Smokes 1 pack a day. Wans to quit. Feels he does not hae will power. Wife has quit. Did not tolerate chantix in past due to bad dreams. Has failed patch. Willing to try wellbutrin. IN terms of copd, symptoms  of cough and dyspnea are stable. There are no new complaints.   Current Medications (verified): 1)  Ranitidine Hcl 300 Mg  Tabs (Ranitidine Hcl) .... At Bedtime 2)  Simvastatin 80 Mg  Tabs (Simvastatin) .... Take 1 Tablet By Mouth Once A Day 3)  Fluoxetine Hcl 20 Mg  Tabs (Fluoxetine Hcl) .... 3 Q Am Than 1 Q Pm 4)  Lisinopril 10 Mg  Tabs (Lisinopril) .... Take 1 Tablet By Mouth Once A Day 5)  Metformin Hcl 1000 Mg  Tabs (Metformin Hcl) .... Take 1 Tablet By Mouth Two Times A Day 6)  Ambien Cr 12.5 Mg  Tbcr (Zolpidem Tartrate) .... At Bedtime 7)  Foradil Aerolizer 12 Mcg  Caps (Formoterol Fumarate) .Marland Kitchen.. 1 Cap Two Times A Day 8)  Spiriva Handihaler 18 Mcg  Caps (Tiotropium Bromide Monohydrate) .Marland Kitchen.. 1 Cap Once Daily  Allergies (verified): No Known Drug Allergies  Past History:  Risk Factors: Smoking Status: quit (02/15/2009)  Past Medical History: # COPD > Isolated low DLCO. 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 #. 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 TOBACCO ABUSE - was on  Chantix. 2008 -> quit smoking 01/2009, relapsed Oct 2010 # Chronic dyspnea, etiology is  chronic obstructive pulmonary disease, obesity, and e deconditioning. #. Long-standing dizziness, neurologist evaluation pending. #. Stable mediastinal nodes Oct 2008 -> Sept 2010.  #. Obesity/OSA -> on cpap  Family History: Reviewed history and no changes required.  Social History: Reviewed history and no changes required.  Review of Systems       The patient complains of productive cough.  The patient denies shortness of breath with activity, shortness of breath at rest, non-productive cough, coughing up blood, chest pain, irregular heartbeats, acid heartburn, indigestion, loss of appetite, weight change, abdominal pain, difficulty swallowing, sore throat, tooth/dental problems, headaches, nasal congestion/difficulty breathing through nose, sneezing, itching, ear ache, anxiety, depression, hand/feet swelling, joint stiffness or pain, rash, change in color of mucus, and fever.    Vital Signs:  Patient profile:   60 year old male Height:      71 inches Weight:      297.38 pounds BMI:     41.63 O2 Sat:      98 % on Room air Temp:     97.7 degrees F oral Pulse rate:   76 / minute BP sitting:   118 / 75  (left arm) Cuff size:   large  Vitals Entered By: Carron Curie CMA (November 29, 2009 8:59 AM)  O2 Flow:  Room air CC: [name] here for follow-up. [name] states he stopped smoking but has recently strated back. [name] has completed pulmonary  rehab. [name] states breathing is good but c/o occs productive cough with clear phlegm.  Comments Medications reviewed with patient Carron Curie CMA  November 29, 2009 9:00 AM    Physical Exam  General:  well developed, well nourished, in no acute distressobese.  smells of tobacco Head:  normocephalic and atraumatic Eyes:  PERRLA/EOM intact; conjunctiva and sclera clear Ears:  TMs intact and clear with normal canals Nose:  no deformity, discharge, inflammation, or lesions Mouth:  no deformity or lesions Neck:  no masses, thyromegaly, or abnormal  cervical nodes Chest Wall:  no deformities noted Lungs:  clear bilaterally to auscultation and percussioncoarse BS throughout.   Heart:  regular rate and rhythm, S1, S2 without murmurs, rubs, gallops, or clicks Abdomen:  bowel sounds positive; abdomen soft and non-tender without masses, or organomegaly Msk:  no deformity or scoliosis noted with normal posture Pulses:  pulses normal Extremities:  no clubbing, cyanosis, edema, or deformity noted Neurologic:  CN II-XII grossly intact with normal reflexes, coordination, muscle strength and tone Skin:  intact without lesions or rashes Cervical Nodes:  no significant adenopathy Axillary Nodes:  no significant adenopathy Psych:  alert and cooperative; normal mood and affect; normal attention span and concentration   Impression & Recommendations:  Problem # 1:  PULMONARY NODULE (ICD-518.89) Assessment Unchanged  New RUL 5mm nodule seen Sept 2010 CT chest  plan repeat ct chest 9 months in July 2011  Orders: Est. Patient Level IV (42595) Radiology Referral (Radiology)  Problem # 2:  C O P D (ICD-496) Assessment: Unchanged stable disease. Has completed rehab.   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  > repeat ct chest 12/2008 some nodes are measured at 1.4cm. > Next ct scan in 01/2009 but he had pna at this time and size is similar.  >CT 07/2009 - nodes unchnaged comapred to 2008  plan no further followup  Problem # 4:  CIGARETTE SMOKER (ICD-305.1) Assessment: Deteriorated  quit 01/2009 but relaposed end 07/2009. SMoking more in Jan 2011. Options of Rx discussed extensively for 11 minutes.   plan Start wellbutrin rov 1 month to report progess side effects warned quit 10 days after starting smoking  Orders: Est. 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 Stable on CT 07/2009. No change from 2008  plan expectant folloowup  Medications Added to Medication List This Visit: 1)  Wellbutrin Xl 150 Mg Xr24h-tab (Bupropion hcl) .... Take 1 tablet a day for 3 days 2)  Wellbutrin Xl 300 Mg Xr24h-tab (Bupropion hcl) .... Take 1 tablet a day in the morning  Patient Instructions: 1)  start wellbutrin 150mg  per day in the morning x 3 days 2)  then increase to 300mg  per day in the morning x daily 3)  caution of side effects like nausea, headaches, palpitations, confusion, tremors etc., 4)  quit smoking 10 days after starting medicines 5)  return in 1 month to report progress 6)  continue your other inhalers Prescriptions: WELLBUTRIN XL 300 MG XR24H-TAB (BUPROPION HCL) take 1 tablet a day in the morning  #30 x 2   Entered and Authorized by:   Kalman Shan MD   Signed by:   Kalman Shan MD on 11/29/2009   Method used:   Print  then Give to Patient   RxID:   [id number] [facility] XL 150 MG XR24H-TAB (BUPROPION HCL) take 1 tablet a day for 3 days  #3 x 0   Entered and Authorized by:   Kalman Shan MD   Signed by:   Kalman Shan MD on 11/29/2009   Method used:   Print then Give to Patient   RxID:   [id number]    Immunization History:  Influenza Immunization History:    Influenza:  fluvax 3+ (06/06/2009)  Pneumovax Immunization History:    Pneumovax:  pneumovax (11/11/2008)

## 2010-12-07 NOTE — Assessment & Plan Note (Signed)
Summary: NP follow up - med calendar   Copy to:  [facility] Primary Provider/Referring Provider:  [facility]  CC:  [facility].  Pt has all meds today.  No complaints. .  History of Present Illness: OV 02/10/2010: Followup tobacco abuse, copd, mediastinal nodes, RUL pulmonary nodule, obesity/osa on cpap. Main reason for visit is smoking counselling and cough followup. Still smoking. intolerant to Wellbutrin due to insomnia. Still couging despite change of lisinpril to benicar last visit in FEb 2011 and starting nasal inhaler samples. States that nasal drainage is cleared but still coughing. WE realize today that he has been on ATENOLOL for over 6 months due to tachycardia at reahb. New symptoms is epigastric pain. Pain is present  randomly. Insidious onset 2 weeks ago. Progresive. Occurs at rest but typically wiht exertion and relieved by rest. Moderate in intensity. Denies radiation. No associatd fever, chills, edema  Of note, he is very confused about his medication list. We also realize he is on asmanex something he has not revealed to [place] in past.   March 03, 2010--Presents for follow up and med review. He is doing well. Breathing is back to baseline. Cough and wheezing are decreased. Epigastric pain stopped with PPI rx. We reviwed his meds today and organized them into a computerized med calendar. He has cut back on smoking. Denies chest pain, orthopnea, hemoptysis, fever, n/v/d, edema, headache.   Preventive Screening-Counseling & Management  Alcohol-Tobacco     Smoking Status: current  Medications Prior to Update: 1)  Ranitidine Hcl 300 Mg  Tabs (Ranitidine Hcl) .... At Bedtime 2)  Vytorin 10-80 Mg Tabs (Ezetimibe-Simvastatin) .... Take 1 Tablet By Mouth Once A Day 3)  Fluoxetine Hcl 20 Mg  Tabs (Fluoxetine Hcl) .... 3 Q Am Than 1 Q Pm 4)  Metformin Hcl 1000 Mg  Tabs (Metformin Hcl) .... Take 1 Tablet By Mouth Two Times A Day 5)  Foradil Aerolizer 12 Mcg  Caps  (Formoterol Fumarate) .Marland Kitchen.. 1 Cap Two Times A Day 6)  Spiriva Handihaler 18 Mcg  Caps (Tiotropium Bromide Monohydrate) .Marland Kitchen.. 1 Cap Once Daily 7)  Albuterol Sulfate (2.5 Mg/17ml) 0.083% Nebu (Albuterol Sulfate) .... Two Times A Day 8)  Lantus 100 Unit/ml Soln (Insulin Glargine) .... 70 Units Sub Q Once Daily in Am 9)  Asmanex 120 Metered Doses 220 Mcg/inh Aepb (Mometasone Furoate) .... Once Daily 10)  Bystolic 5 Mg  Tabs (Nebivolol Hcl) .... One Tablet Daily 11)  Isosorbide Mononitrate Cr 60 Mg Xr24h-Tab (Isosorbide Mononitrate) .... Take 1 Tablet By Mouth Once A Day 12)  Benicar Hct 20-12.5 Mg Tabs (Olmesartan Medoxomil-Hctz) .... Take 1 Tablet By Mouth Once A Day 13)  Zegerid Otc 20-1100 Mg Caps (Omeprazole-Sodium Bicarbonate) .Marland Kitchen.. 1 Capsule Early Morning Before Breakfast  Allergies (verified): No Known Drug Allergies  Past History:  Family History: Last updated: 03/03/2010 mother-emphysema, asthma Father-DM, heart disease  Social History: Last updated: 03/03/2010 smokes Patient is a current smoker. 1 ppd x 40 yrs no alcohol 3 children Married  Risk Factors: Smoking Status: current (03/03/2010)  Past Medical History: # COPD > Isolated low DLCO. 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 #. 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 TOBACCO ABUSE - was on  Chantix. 2008.  quit smoking 01/2009, relapsed Oct 2010. Intolerant to chantix - intolerant to Wellbutrin April 2011  # Chronic dyspnea,  etiology is chronic obstructive pulmonary disease, obesity, and e deconditioning. #. Long-standing dizziness, neurologist evaluation pending. #. Stable mediastinal nodes Oct 2008 -> Sept 2010.  #. Obesity/OSA -> on cpap #CAD.Marland KitchenMarland KitchenMarland KitchenDr Garnette Scheuermann (updated 02/10/2010) > Started on atenolol fall 2010 or so for tachycardia -> changed to bystolic April 2011 due to cough >s/p co.  stenting > per hx last stress test 2005 negative #COMPLEX MED REGIMEN >Meds reviewed with pt education and computerized med calendar completed March 03, 2010  Past Surgical History: hernia repair  left rotator cuff ulner nerve carpal tunnel surgery - right and left hand  Family History: mother-emphysema, asthma Father-DM, heart disease  Social History: smokes Patient is a current smoker. 1 ppd x 40 yrs no alcohol 3 children Married  Smoking Status:  current  Review of Systems      See HPI  Vital Signs:  Patient profile:   60 year old male Height:      71 inches Weight:      288 pounds BMI:     40.31 O2 Sat:      96 % on Room air Temp:     96.8 degrees F oral Pulse rate:   67 / minute BP sitting:   112 / 60  (right arm) Cuff size:   large  Vitals Entered By: Boone Master CNA (March 03, 2010 8:51 AM)  O2 Flow:  Room air CC: [facility].  Pt has all meds today.  No complaints.  Is Patient Diabetic? Yes Comments Medications reviewed with patient Daytime contact number verified with patient. Boone Master CNA  March 03, 2010 8:51 AM    Physical Exam  Additional Exam:  GEN: A/Ox3; pleasant , NAD HEENT:  [place]/AT, , EACs-clear, TMs-wnl, NOSE-clear, THROAT-clear NECK:  Supple w/ fair ROM; no JVD; normal carotid impulses w/o bruits; no thyromegaly or nodules palpated; no lymphadenopathy. RESP  CTA , diminshed in bases  CARD:  RRR, no m/r/g   GI:   Soft & nt; nml bowel sounds; no organomegaly or masses detected. [place]: Warm bil,  no calf tenderness edema, clubbing, pulses intact Neuro: intact w/ no focal deficit noted.    Impression & Recommendations:  Problem # 1:  C O P D (ICD-496) Improved control on current regimen.  Meds reviewed with pt education and computerized med calendar completed/adjusted.     Problem # 2:  EPIGASTRIC PAIN (ICD-789.06) resolved w/ PPI c/w GERD GERD diet  Problem # 3:  PULMONARY NODULE (ICD-518.89)  RUL nodule stable on CT  07/2009 follow up CT due in 05/2010, set up today w/ follow up w/ Dr. Marchelle Gearing  encouraged on smoking cesstation.  Orders: Est. Patient Level III (16109)  Complete Medication List: 1)  Ranitidine Hcl 300 Mg Tabs (Ranitidine hcl) .... At bedtime 2)  Vytorin 10-80 Mg Tabs (Ezetimibe-simvastatin) .... Take 1 tablet by mouth once a day 3)  Fluoxetine Hcl 20 Mg Tabs (Fluoxetine hcl) .... 3 q am than 1 q pm 4)  Metformin Hcl 1000 Mg Tabs (Metformin hcl) .... Take 1 tablet by mouth two times a day 5)  Foradil Aerolizer 12 Mcg Caps (Formoterol fumarate) .Marland Kitchen.. 1 cap two times a day 6)  Spiriva Handihaler 18 Mcg Caps (Tiotropium bromide monohydrate) .Marland Kitchen.. 1 cap once daily 7)  Albuterol Sulfate (2.5 Mg/82ml) 0.083% Nebu (Albuterol sulfate) .... Two times a day 8)  Lantus 100 Unit/ml Soln (Insulin glargine) .... 70 units sub q once daily in am 9)  Asmanex 120 Metered Doses 220 Mcg/inh  Aepb (Mometasone furoate) .... Once daily 10)  Bystolic 5 Mg Tabs (Nebivolol hcl) .... One tablet daily 11)  Isosorbide Mononitrate Cr 60 Mg Xr24h-tab (Isosorbide mononitrate) .... Take 1 tablet by mouth once a day 12)  Benicar Hct 20-12.5 Mg Tabs (Olmesartan medoxomil-hctz) .... Take 1 tablet by mouth once a day 13)  Zegerid Otc 20-1100 Mg Caps (Omeprazole-sodium bicarbonate) .Marland Kitchen.. 1 capsule early morning before breakfast  Patient Instructions: 1)   Hold fish oil for now 2)  Follow med calendar closely and bring to each visit.  3)  follow up Dr. Marchelle Gearing in July after CT scan. and as needed  4)  you need CT chest in July 2011 - sorry forgot to tell you that Prescriptions: BYSTOLIC 5 MG  TABS (NEBIVOLOL HCL) One tablet daily  #30 x 5   Entered and Authorized by:   Rubye Oaks NP   Signed by:   Rubye Oaks NP on 03/03/2010   Method used:   Electronically to        [facility] [phone]* (retail)       [address]       [place], [place]  [address]       Ph: [phone]       Fax: [phone]   RxID:    [id number]   Appended Document: med calendar update    Clinical Lists Changes  Medications: Added new medication of CENTRUM SILVER  TABS (MULTIPLE VITAMINS-MINERALS) Take 1 tablet by mouth once a day - Signed Changed medication from ZEGERID OTC 20-1100 MG CAPS (OMEPRAZOLE-SODIUM BICARBONATE) 1 capsule early morning before breakfast to OMEPRAZOLE 20 MG CPDR (OMEPRAZOLE) Take 1 capsule by mouth once a day 30 minutes before meal - Signed Added new medication of LANTUS 100 UNIT/ML SOLN (INSULIN GLARGINE) 70 units  every morning - Signed Removed medication of LANTUS 100 UNIT/ML SOLN (INSULIN GLARGINE) 70 units sub q once daily in am - Signed Added new medication of NOVOLOG 100 UNIT/ML SOLN (INSULIN ASPART) sliding scale with each meal - Signed Added new medication of ASPIRIN 81 MG TBEC (ASPIRIN) Take 1 tablet by mouth once a day Changed medication from FLUOXETINE HCL 20 MG  TABS (FLUOXETINE HCL) 3 q am than 1 q pm to FLUOXETINE HCL 40 MG CAPS (FLUOXETINE HCL) Take 1 capsule by mouth once a day Added new medication of TRAZODONE HCL 100 MG TABS (TRAZODONE HCL) Take 1 tab by mouth at bedtime Changed medication from VYTORIN 10-80 MG TABS (EZETIMIBE-SIMVASTATIN) Take 1 tablet by mouth once a day to VYTORIN 10-80 MG TABS (EZETIMIBE-SIMVASTATIN) Take 1 tab by mouth at bedtime Changed medication from [facility] 120 METERED DOSES 220 MCG/INH AEPB (MOMETASONE FUROATE) once daily to [facility] 120 METERED DOSES 220 MCG/INH AEPB (MOMETASONE FUROATE) inhale 2 puffs at bedtime Changed medication from FORADIL AEROLIZER 12 MCG  CAPS (FORMOTEROL FUMARATE) 1 CAP two times a day to FORADIL AEROLIZER 12 MCG  CAPS (FORMOTEROL FUMARATE) Inhale contents of 1 capsule twice a day Changed medication from SPIRIVA HANDIHALER 18 MCG  CAPS (TIOTROPIUM BROMIDE MONOHYDRATE) 1 cap once daily to SPIRIVA HANDIHALER 18 MCG  CAPS (TIOTROPIUM BROMIDE MONOHYDRATE) Inhale contents of 1 capsule once a day Added new medication of * OXYGEN  2L/MIN wear at bedtime Added new medication of * CPAP wear at bedtime Added new medication of NASACORT AQ 55 MCG/ACT AERS (TRIAMCINOLONE ACETONIDE(NASAL)) 2 puffs each nostril two times a day as needed Added new medication of IBUPROFEN 200 MG TABS (IBUPROFEN) per bottle as needed Removed medication  of RANITIDINE HCL 300 MG  TABS (RANITIDINE HCL) at bedtime Removed medication of ALBUTEROL SULFATE (2.5 MG/3ML) 0.083% NEBU (ALBUTEROL SULFATE) two times a day

## 2010-12-07 NOTE — Progress Notes (Signed)
Summary: prescript  Phone Note Call from Patient   Caller: Patient Call For: [name] Summary of Call: pt need 90 prescript for benicar20.12.5 and bystolic 5mg  . fax to  [employer] at [phone] ref #[id number] pt would like samples until rx come back. Initial call taken by: Rickard Patience,  Apr 04, 2010 9:26 AM  Follow-up for Phone Call        refill sent, samples at front to last until meds come in. pt aware. Carron Curie CMA  Apr 04, 2010 10:21 AM     New/Updated Medications: BYSTOLIC 5 MG TABS (NEBIVOLOL HCL) Take 1 tablet by mouth once a day Prescriptions: BYSTOLIC 5 MG TABS (NEBIVOLOL HCL) Take 1 tablet by mouth once a day  #90 x 0   Entered by:   Carron Curie CMA   Authorized by:   Kalman Shan MD   Signed by:   Carron Curie CMA on 04/04/2010   Method used:   Faxed to ...       [facility] (mail-order)             , [place]         Ph: [phone]       Fax: [phone]   RxID:   [id number] BENICAR HCT 20-12.5 MG TABS (OLMESARTAN MEDOXOMIL-HCTZ) Take 1 tablet by mouth once a day  #90 x 0   Entered by:   Carron Curie CMA   Authorized by:   Kalman Shan MD   Signed by:   Carron Curie CMA on 04/04/2010   Method used:   Faxed to ...       [facility] (mail-order)             , [place]         Ph: [phone]       Fax: [phone]   RxID:   [id number]

## 2010-12-07 NOTE — Assessment & Plan Note (Signed)
Summary: ROV/ CT RESULTS///kp   Visit Type:  Follow-up Copy to:  [facility]/Referring [place]:  [facility]  CC:  Pt here for follow-up. Pt state breathing has improved. 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 We changed his atenolol to bystolic. Then saw NP for med calendar; fish oil stopped that visit. Since then cough resolved. Dyspnea is resolved too. Compliant with meds. Does not have med calendar with him but states he is compliant and is taking all meds in our chart. Struggling to quit smoking but has cut down. Denies chest pain, orthopnea, hemoptysis, fever, n/v/d, edema, headache. Epigastric pain that was present in April 2011 resolved with PPI Rx.  Preventive Screening-Counseling & Management  Alcohol-Tobacco     Smoking Status: current     Smoking Cessation Counseling: yes     Smoke Cessation Stage: contemplative     Tobacco Counseling: to quit use of tobacco products  Current Medications (verified): 1)  Centrum Silver  Tabs (Multiple Vitamins-Minerals) .... Take 1 Tablet By Mouth Once A Day 2)  Omeprazole 20 Mg Cpdr (Omeprazole) .... Take 1 Capsule By Mouth Once A Day 30 Minutes Before Meal 3)  Metformin Hcl 1000 Mg  Tabs (Metformin Hcl) .... Take 1 Tablet By Mouth Two Times A Day 4)  Lantus 100 Unit/ml Soln (Insulin Glargine) .... 70 Units  Every Morning 5)  Novolog 100 Unit/ml Soln (Insulin Aspart) .... Sliding Scale With Each Meal 6)  Benicar Hct 20-12.5 Mg Tabs (Olmesartan Medoxomil-Hctz) .... Take 1 Tablet By Mouth Once A Day 7)  Isosorbide Mononitrate Cr 60 Mg Xr24h-Tab (Isosorbide Mononitrate) .... Take 1 Tablet By Mouth Once A Day 8)  Vytorin 10-80 Mg Tabs (Ezetimibe-Simvastatin) .... Take 1 Tab By  Mouth At Bedtime 9)  Aspirin 81 Mg Tbec (Aspirin) .... Take 1 Tablet By Mouth Once A Day 10)  Fluoxetine Hcl 40 Mg Caps (Fluoxetine Hcl) .... Take 1 Capsule By Mouth Once A Day 11)  Trazodone Hcl 100 Mg Tabs (Trazodone Hcl) .... Take 1 Tab By Mouth At Bedtime 12)  Asmanex 120 Metered Doses 220 Mcg/inh Aepb (Mometasone Furoate) .... Inhale 2 Puffs At Bedtime 13)  Foradil Aerolizer 12 Mcg  Caps (Formoterol Fumarate) .... Inhale Contents of 1 Capsule Twice A Day 14)  Spiriva Handihaler 18 Mcg  Caps (Tiotropium Bromide Monohydrate) .... Inhale Contents of 1 Capsule Once A Day 15)  Oxygen 2l/min .... Wear At Bedtime 16)  Cpap .... Wear At Bedtime 17)  Nasacort Aq 55 Mcg/act Aers (Triamcinolone Acetonide(Nasal)) .... 2 Puffs Each Nostril Two Times A Day As Needed 18)  Ibuprofen 200 Mg Tabs (Ibuprofen) .... Per Bottle As Needed 19)  Bystolic 5 Mg Tabs (Nebivolol Hcl) .... Take 1 Tablet By Mouth Once A Day  Allergies: No Known Drug Allergies  Past History:  Family History: Last updated: 03/03/2010 mother-emphysema, asthma Father-DM, heart disease  Social History: Last updated: 03/03/2010 smokes Patient is a current smoker. 1 ppd x 40 yrs no alcohol 3 children Married  Risk Factors: Smoking Status: current (06/20/2010)  Past Medical History: # COPD > Isolated low DLCO. 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 #. 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 TOBACCO ABUSE - was on  Chantix. 2008.  quit smoking 01/2009, relapsed Oct 2010. Intolerant to chantix - intolerant to Wellbutrin April 2011  # Chronic class 1-2 dyspnea, etiology is chronic obstructive pulmonary disease, obesity, and e deconditioning. #. Long-standing dizziness, neurologist evaluation pending. #. Stable mediastinal nodes Oct 2008 -> Sept 2010 -> July 2011. No further fu  #. Obesity/OSA -> on  cpap #CAD.Marland KitchenMarland KitchenMarland KitchenDr Garnette Scheuermann (updated 02/10/2010) > Started on atenolol fall 2010 or so for tachycardia -> changed to bystolic April 2011 due to cough >s/p co. stenting > per hx last stress test 2005 negative #COMPLEX MED REGIMEN >Meds reviewed with pt education and computerized med calendar completed March 03, 2010  Past Surgical History: Reviewed history from 03/03/2010 and no changes required. hernia repair  left rotator cuff ulner nerve carpal tunnel surgery - right and left hand  Family History: Reviewed history from 03/03/2010 and no changes required. mother-emphysema, asthma Father-DM, heart disease  Social History: Reviewed history from 03/03/2010 and no changes required. smokes Patient is a current smoker. 1 ppd x 40 yrs no alcohol 3 children Married  Review of Systems  The patient denies shortness of breath with activity, shortness of breath at rest, productive cough, non-productive cough, coughing up blood, chest pain, irregular heartbeats, acid heartburn, indigestion, loss of appetite, weight change, abdominal pain, difficulty swallowing, sore throat, tooth/dental problems, headaches, nasal congestion/difficulty breathing through nose, sneezing, itching, ear ache, anxiety, depression, hand/feet swelling, joint stiffness or pain, rash, change in color of mucus, and fever.    Vital Signs:  Patient profile:   60 year old male Height:      71 inches Weight:      299 pounds O2 Sat:      96 % on Room air Temp:     97.3 degrees F oral Pulse rate:   70 / minute BP sitting:   110 / 52  (right arm) Cuff size:   regular  Vitals Entered By: Carron Curie CMA (June 20, 2010 9:15 AM)  O2 Flow:  Room air CC: Pt here for follow-up. Pt state breathing has improved.  Comments Medications reviewed with patient Carron Curie CMA  June 20, 2010 9:15 AM Daytime phone number verified with patient.    Physical Exam  General:  well developed, well nourished, in no  acute distressobese.  tobacco smell + Head:  normocephalic and atraumatic Eyes:  PERRLA/EOM intact; conjunctiva and sclera clear Ears:  TMs intact and clear with normal canals Nose:  no deformity, discharge, inflammation, or lesions Mouth:  no deformity or lesions Neck:  no masses, thyromegaly, or abnormal cervical nodes Chest Wall:  no deformities noted Lungs:  decreased BS bilateral and prolonged exhilation.   Heart:  regular rate and rhythm, S1, S2 without murmurs, rubs, gallops, or clicks Abdomen:  bowel sounds positive; abdomen soft and non-tender without masses, or organomegaly   Msk:  no deformity or scoliosis noted with normal posture Pulses:  pulses normal Extremities:  no clubbing, cyanosis, edema, or deformity noted Neurologic:  CN II-XII grossly intact with normal reflexes, coordination, muscle strength and tone Skin:  intact without lesions or rashes Cervical Nodes:  no significant adenopathy Axillary Nodes:  no significant adenopathy Psych:  alert and cooperative; normal mood and affect; normal attention span and concentration   Impression & Recommendations:  Problem # 1:  CIGARETTE SMOKER (ICD-305.1) Assessment Improved  still not quit. has hx of intolerance to chantix  and wellbutrin. Encouraged to quit. He states he will.  Orders: Est. 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  > repeat ct chest 12/2008 some nodes are measured at 1.4cm. > Next ct scan in 01/2009 but he had pna at this time and size is similar.  >CT 07/2009 - nodes unchnaged comapred to 2008 > CT july 2011 - no change  plan no further followup  Orders: Est. Patient Level IV (60454)  Problem # 3:  COUGH, CHRONIC (ICD-786.2) Assessment: Improved resolved after change of atenolol to bystolic  plan monitor expectantly  Problem # 4:  PULMONARY NODULE (ICD-518.89) Assessment:  Unchanged  New RUL 5mm nodule seen Sept 2010 CT chest No change July 2011 cT chest (per DR Fredirick Lathe on this report she feels all nodules stable for > 2 years and does not commnet on new nodule in Sept 2010)  plan no furhter fu will check with Dr. Fredirick Lathe regarding veracity of her report  Orders: Est. Patient Level IV (09811)  Problem # 5:  C O P D (ICD-496) Assessment: Unchanged stable disease. s/p rehab  plan trial off asmanex and foradil continue spiriva alone check alpha 1 antitrypsin (sent out to [place]) flyu shot in fall advised quit smoking [facility]itnue [facility] spirometry at fu  Patient Instructions: 1)  #FOR YOUR COPD 2)  stop your asmanex and foradil 3)  take spiriva alone 1 puff daily 4)  if this is making your breathing worse, call us immediately 5)  otherwisse return in 6 months 6)  will do spirometry at followup 7)  have flu shot in fall either here or anywhere 8)  if my nurse has time today she will check alpha 1 antitrypsin 9)   - send out to [facility], this is for genetic cause of copd 10)  take all your medicines regularly 11)  bring your med list with you 12)  #FOR YOUR NODULE 13)  - I will double check with radiologist ifanother scan is needed 14)  #SMOKING 15)  - Please quit smoking asap 16)  #SLEEP APNEa 17)   - use your cpap regularly Prescriptions: SPIRIVA HANDIHALER 18 MCG  CAPS (TIOTROPIUM BROMIDE MONOHYDRATE) Inhale contents of 1 capsule once a day  #1 x 6   Entered and Authorized by:   Kalman Shan MD   Signed by:   Kalman Shan MD on 06/20/2010   Method used:   Faxed to ...       [facility] (mail-order)             , [place]         Ph: [phone]       Fax: [phone]   RxID:   [id number]

## 2010-12-15 ENCOUNTER — Ambulatory Visit: Payer: Self-pay | Admitting: Internal Medicine

## 2011-02-13 LAB — GLUCOSE, CAPILLARY: Glucose-Capillary: 280 mg/dL — ABNORMAL HIGH (ref 70–99)

## 2011-02-15 LAB — GLUCOSE, CAPILLARY
Glucose-Capillary: 157 mg/dL — ABNORMAL HIGH (ref 70–99)
Glucose-Capillary: 271 mg/dL — ABNORMAL HIGH (ref 70–99)
Glucose-Capillary: 273 mg/dL — ABNORMAL HIGH (ref 70–99)
Glucose-Capillary: 307 mg/dL — ABNORMAL HIGH (ref 70–99)
Glucose-Capillary: 310 mg/dL — ABNORMAL HIGH (ref 70–99)
Glucose-Capillary: 312 mg/dL — ABNORMAL HIGH (ref 70–99)
Glucose-Capillary: 436 mg/dL — ABNORMAL HIGH (ref 70–99)

## 2011-02-15 LAB — CBC
HCT: 38.6 % — ABNORMAL LOW (ref 39.0–52.0)
Hemoglobin: 13.4 g/dL (ref 13.0–17.0)
MCHC: 34.3 g/dL (ref 30.0–36.0)
MCHC: 34.8 g/dL (ref 30.0–36.0)
MCV: 92.4 fL (ref 78.0–100.0)
MCV: 92.8 fL (ref 78.0–100.0)
Platelets: 138 10*3/uL — ABNORMAL LOW (ref 150–400)
RBC: 4.08 MIL/uL — ABNORMAL LOW (ref 4.22–5.81)
RDW: 13.7 % (ref 11.5–15.5)
WBC: 5.1 10*3/uL (ref 4.0–10.5)
WBC: 8 10*3/uL (ref 4.0–10.5)

## 2011-02-15 LAB — POCT I-STAT, CHEM 8
Calcium, Ion: 1.04 mmol/L — ABNORMAL LOW (ref 1.12–1.32)
Chloride: 103 mEq/L (ref 96–112)
Glucose, Bld: 196 mg/dL — ABNORMAL HIGH (ref 70–99)
HCT: 40 % (ref 39.0–52.0)
TCO2: 23 mmol/L (ref 0–100)

## 2011-02-15 LAB — BASIC METABOLIC PANEL
CO2: 22 mEq/L (ref 19–32)
Calcium: 9.1 mg/dL (ref 8.4–10.5)
Chloride: 101 mEq/L (ref 96–112)
Creatinine, Ser: 1.01 mg/dL (ref 0.4–1.5)
GFR calc Af Amer: >60 mL/min (ref >60)
GFR calc non Af Amer: >60 mL/min (ref >60)
Glucose, Bld: 202 mg/dL — ABNORMAL HIGH (ref 70–99)
Potassium: 4.4 mEq/L (ref 3.5–5.1)
Sodium: 132 mEq/L — ABNORMAL LOW (ref 135–145)

## 2011-02-15 LAB — POCT I-STAT 3, ART BLOOD GAS (G3+)
Acid-base deficit: 2 mmol/L (ref 0.0–2.0)
Bicarbonate: 22.4 mEq/L (ref 20.0–24.0)
O2 Saturation: 86 %
pO2, Arterial: 51 mmHg — ABNORMAL LOW (ref 80.0–100.0)

## 2011-02-15 LAB — HEMOGLOBIN A1C: Hgb A1c MFr Bld: 7.3 % — ABNORMAL HIGH (ref 4.6–6.1)

## 2011-02-15 LAB — DIFFERENTIAL
Basophils Relative: 0 % (ref 0–1)
Monocytes Relative: 8 % (ref 3–12)
Neutro Abs: 5.9 10*3/uL (ref 1.7–7.7)
Neutrophils Relative %: 74 % (ref 43–77)

## 2011-02-15 LAB — COMPREHENSIVE METABOLIC PANEL
AST: 36 U/L (ref 0–37)
Albumin: 3.4 g/dL — ABNORMAL LOW (ref 3.5–5.2)
Calcium: 8.4 mg/dL (ref 8.4–10.5)
Creatinine, Ser: 0.97 mg/dL (ref 0.4–1.5)
GFR calc Af Amer: >60 mL/min (ref >60)

## 2011-02-15 LAB — URINALYSIS, ROUTINE W REFLEX MICROSCOPIC
Glucose, UA: 250 mg/dL — AB
Leukocytes, UA: NEGATIVE
Protein, ur: NEGATIVE mg/dL
Specific Gravity, Urine: 1.016 (ref 1.005–1.030)
Urobilinogen, UA: 1 mg/dL (ref 0.0–1.0)

## 2011-02-15 LAB — URINE MICROSCOPIC-ADD ON

## 2011-02-20 LAB — DIFFERENTIAL
Eosinophils Absolute: 0.2 10*3/uL (ref 0.0–0.7)
Lymphocytes Relative: 28 % (ref 12–46)
Lymphs Abs: 2.1 10*3/uL (ref 0.7–4.0)
Neutrophils Relative %: 61 % (ref 43–77)

## 2011-02-20 LAB — POCT I-STAT, CHEM 8
BUN: 14 mg/dL (ref 6–23)
HCT: 41 % (ref 39.0–52.0)
Hemoglobin: 13.9 g/dL (ref 13.0–17.0)
Sodium: 140 mEq/L (ref 135–145)
TCO2: 29 mmol/L (ref 0–100)

## 2011-02-20 LAB — CBC
Hemoglobin: 12.9 g/dL — ABNORMAL LOW (ref 13.0–17.0)
MCHC: 35.2 g/dL (ref 30.0–36.0)
MCV: 92.3 fL (ref 78.0–100.0)
RBC: 3.96 MIL/uL — ABNORMAL LOW (ref 4.22–5.81)

## 2011-02-20 LAB — GLUCOSE, CAPILLARY: Glucose-Capillary: 221 mg/dL — ABNORMAL HIGH (ref 70–99)

## 2011-02-20 LAB — COMPREHENSIVE METABOLIC PANEL
BUN: 11 mg/dL (ref 6–23)
CO2: 27 mEq/L (ref 19–32)
Calcium: 8.4 mg/dL (ref 8.4–10.5)
Creatinine, Ser: 1.1 mg/dL (ref 0.4–1.5)
GFR calc non Af Amer: >60 mL/min (ref >60)
Glucose, Bld: 216 mg/dL — ABNORMAL HIGH (ref 70–99)

## 2011-02-20 LAB — POCT CARDIAC MARKERS: Myoglobin, poc: 83.2 ng/mL (ref 12–200)

## 2011-02-20 LAB — HEMOGLOBIN A1C
Hgb A1c MFr Bld: 7.8 % — ABNORMAL HIGH (ref 4.6–6.1)
Mean Plasma Glucose: 177 mg/dL

## 2011-02-20 LAB — CARDIAC PANEL(CRET KIN+CKTOT+MB+TROPI)
Relative Index: INVALID (ref 0.0–2.5)
Total CK: 89 U/L (ref 7–232)

## 2011-02-20 LAB — PROTIME-INR
INR: 1 (ref 0.00–1.49)
Prothrombin Time: 13.1 seconds (ref 11.6–15.2)

## 2011-02-20 LAB — D-DIMER, QUANTITATIVE: D-Dimer, Quant: 1.33 ug/mL-FEU — ABNORMAL HIGH (ref 0.00–0.48)

## 2011-02-20 LAB — LIPID PANEL
LDL Cholesterol: 63 mg/dL (ref 0–99)
Total CHOL/HDL Ratio: 6.8 RATIO
VLDL: 47 mg/dL — ABNORMAL HIGH (ref 0–40)

## 2011-03-20 NOTE — Cardiovascular Report (Signed)
NAMEMATHEO, RATHBONE             ACCOUNT NO.:  [id number]   MEDICAL RECORD NO.:  [id number]          PATIENT TYPE:  INP   LOCATION:  3712                         FACILITY:  [facility]   PHYSICIAN:  Lyn Records, M.D.   DATE OF BIRTH:  06-05-1951   DATE OF PROCEDURE:  06/15/2008  DATE OF DISCHARGE:                            CARDIAC CATHETERIZATION   INDICATIONS FOR STUDY:  Prolonged chest pain.   DATE OF STUDY:  June 15, 2008.   BACKGROUND INFORMATION:  The patient is 60 years of age who has a  history of coronary atherosclerosis.  He is status post right coronary  stenting in 2006.  Continues to smoke cigarettes and is diabetic with  poor control.  He was admitted to the hospital with an episode of left  parasternal rather focal chest discomfort that radiated into the left  arm and was associated with diaphoresis.  The entire episode lasted 10-  15 minutes max.  He has been having intermittent chest pain for about a  month that occurs spontaneously in the same location and has been  noticed to spontaneously resolve.  There has been no exertional  component.   PROCEDURE PERFORMED:  1. Left heart catheterization.  2. Selective coronary angiography.  3. Left ventriculography.  4. Left anterior descending artery intravascular ultrasound.   DESCRIPTION:  After informed consent, the patient was brought to the  cath lab in the postabsorptive state.  A 6-French sheath was placed in  the right femoral artery using a modified Seldinger technique.  A 6-  [place] A2 multipurpose catheter was then used for hemodynamic  recordings, left ventriculography by hand injection, and selective left  and right coronary angiography.  We also used a 6-French Judkins right  catheter for right coronary angiography.  After reviewing the  angiograms, there was a suspicious region of haziness with slight  reduction in flow noted in the mid LAD after a large second diagonal  branch.  After multiple views  and with the appearance of continued  haziness, we felt that intravascular ultrasound will be appropriate to  more aggressively investigate this lesion.   We used bivalirudin bolus followed by an infusion.  A bolus of 0.75  mg/kg followed by 1.75 mg per hour.  ACT was documented to be 300.  Four  baby aspirin were given.  The intravascular ultrasound was performed  from the distal LAD to the proximal LAD.  After reviewing the IVUS  images, the case was terminated.  Intravascular ultrasound did not  reveal any area less than 4.2 mm cross-sectional area in the mid LAD.  Moderate calcium and positive remodeling was noted.  In the proximal  vessel, there was a region of circumferential calcium with a cross-  sectional area of around 6 mm square.   RESULTS:  1. Hemodynamic data:      a.     Left ventricular pressure 117/12.      b.     Aortic pressure 116/66.  2. Left ventriculography:  The left ventricle is moderate in size.      Contractility is low normal.  EF is  55%.  No regional abnormalities      noted.  No mitral regurgitation.  3. Coronary angiography.      a.     Left main coronary:  Widely patent and calcified.      b.     Left anterior descending coronary:  Widely patent with       regions of hypodensity and eccentric stenoses noted throughout the       midvessel between the large diagonal branch and the smaller second       diagonal branch.      c.     Circumflex artery:  Generalized segmental 50-60% narrowing       is noted in the proximal and mid circumflex.  No high-grade       obstructions are seen.      d.     Ramus branch:  This branch is large and widely patent.      e.     Right coronary:  Dominant.    Large PDA and 2 left       ventricular branches.  The distal stent in the right coronary is       widely patent.  The proximal vessel contains luminal       irregularities.  4. Intravascular ultrasound:  In the proximal LAD, there is a focal      region of  circumferential calcium with cross-sectional area      slightly greater than 6 mm square.  The tightest region in the mid      vessel where moderate positive remodeling is noted and regions of      calcium that are noncircumferential is around 4.2 mm square.   CONCLUSIONS:  1. Moderate circumflex and LAD disease.  No angiographic or      intravascular ultrasound evidence of hemodynamically significant      lesions.  2. Widely patent stent in the right coronary.  3. Normal left ventricular function.   PLAN:  Aggressive risk factor modification.  Encouraged to stop smoking.  Diabetes control.  Clinical followup in approximately 1 week to check  groin site.   ADDENDUM:  Angio-Seal was performed for hemostasis.  Good hemostasis was  achieved.  No complications were noted.  A sheathogram was performed to  document adequate anatomy for Angio-Seal use.      Lyn Records, M.D.  Electronically Signed     HWS/MEDQ  D:  06/15/2008  T:  06/16/2008  Job:  [id number]   cc:   Georgann Housekeeper, MD  Hal T. Stoneking, M.D.

## 2011-03-20 NOTE — H&P (Signed)
[name], EDDINS NO.:  [id number]   MEDICAL RECORD NO.:  [id number]          PATIENT TYPE:  INP   LOCATION:  2037                         FACILITY:  [facility]   PHYSICIAN:  Peter M. Swaziland, M.D.  DATE OF BIRTH:  23-Aug-1951   DATE OF ADMISSION:  11/13/2007  DATE OF DISCHARGE:                              HISTORY & PHYSICAL   HISTORY OF PRESENT ILLNESS:  Mr. Overbaugh is a 60 year old white male  with known history of coronary disease who presents with symptoms of  chest pain and a syncopal episode today.  The patient states he has had  increased chest pain over the past 2-3 days.  He describes his  substernal chest pain as a pressure radiating to his left arm.  He has  chronic shortness of breath which is unchanged.  He has not tried  nitroglycerin at home but did get prompt relief of his chest pain in the  emergency department with sublingual nitroglycerin.  The patient states  he had gotten up to go to the bathroom today when he felt very swimmy  headed and passed out.  He hit his right knee and forehead.  This was  unwitnessed.  He states he blacked out a couple of other times earlier  this month.  He did not seek medical attention.  The patient did undergo  stenting of the right coronary in 2006.  He had a subsequent cardiac  catheterization in July 2008 which showed nonobstructive disease.  He  has not had cardiac follow-up since then.   PAST MEDICAL HISTORY:  1. Diabetes mellitus type 2, insulin requiring.  2. Hypertension.  3. Morbid obesity.  4. COPD/emphysema.  5. Obstructive sleep apnea.  6. History of coronary disease status post stenting of the right      coronary in 2006.   MEDICATIONS:  The patient did not bring a list, and he is unfamiliar  with his medications and doses.  From what I can tell, he is taking  TriCor, aspirin, Foradil, Albuterol, Spiriva, omeprazole, metformin,  Zocor and possibly lisinopril.  He states he takes a long-acting  insulin 50 units in the morning, 10 units at noon time and 30 units at  night.  He is also on short-acting insulin on a sliding scale.   He has no known allergies.   SOCIAL HISTORY:  He is disabled.  He has a 35 pack-year history of  smoking.  He denies alcohol use.  He has no children.   FAMILY HISTORY:  Is noncontributory.   REVIEW OF SYSTEMS:  He has had no increased edema, orthopnea or PND.  He  has chronic shortness of breath and has been evaluated by pulmonary at  the [facility] office with a CT of the chest within the past year.  He  states some of his medicines he does not like to take such as his Zocor,  and he is in consistent with using CPAP therapy.  Other review of  systems are negative.   PHYSICAL EXAMINATION:  The patient is an obese white male in no  distress.  His blood pressure is  142/63, pulse is 92 in sinus rhythm.  He is afebrile.  His HEENT exam reveals a mild abrasion of his forehead and right knee.  His pupils are equal, round, and reactive to light accommodation.  Oropharynx is clear.  NECK:  Is supple without JVD, adenopathy, thyromegaly or bruits.  LUNGS:  Clear to auscultation and percussion.  CARDIAC EXAM:  Reveals a regular rate and rhythm without gallop, murmur,  rub or click.  ABDOMEN:  Soft and nontender.  It is morbidly obese.  His pulses were 2+ and symmetric.  He has no edema.  NEUROLOGIC EXAM:  Is nonfocal.   LABORATORY DATA:  ECG is normal.  Cranial CT showed no active no acute  change.  X-rays of his right knee showed no fracture.  White count was  12,400, hemoglobin 13.1, hematocrit 39.3, platelets 286,000.  Sodium is  135, potassium 4.5, chloride 105, CO2 24, BUN 19, creatinine 1.1,  glucose is 128.  Point of care cardiac enzymes are negative.   IMPRESSION:  1. Chest pain with recent onset, suspicious for angina.  However, the      patient has had chest pain in the past that was noncardiac.  2. Syncopal episode; etiology is unclear.  3.  Diabetes mellitus type 2, insulin requiring.  4. Hypertension.  5. Chronic obstructive pulmonary disease/emphysema.  6. Obstructive sleep apnea.  7. Tobacco abuse.  8. Hypertension.  9. Dyslipidemia.   PLAN:  Will admit to telemetry.  Will obtain serial cardiac enzymes and  ECG to rule out myocardial infarction.  Will check a lipid status and  A1c.  He will be treated with topical nitrates and subcu Lovenox  overnight.  Will need to identify and resume his home medications.  Further workup per Dr. Katrinka Blazing.           ______________________________  Peter M. Swaziland, M.D.     PMJ/MEDQ  D:  11/13/2007  T:  11/14/2007  Job:  [id number]   cc:   Lyn Records, M.D.  [facility], M.D.

## 2011-03-20 NOTE — Discharge Summary (Signed)
NAMEJACEYON, STROLE             ACCOUNT NO.:  [id number]   MEDICAL RECORD NO.:  [id number]          PATIENT TYPE:  INP   LOCATION:  3712                         FACILITY:  [facility]   PHYSICIAN:  Lyn Records, M.D.   DATE OF BIRTH:  [birth year]   DATE OF ADMISSION:  06/14/2008  DATE OF DISCHARGE:  06/15/2008                               DISCHARGE SUMMARY   DISCHARGE DIAGNOSES:  1. Chest pain, resolved.  2. Coronary artery disease, nonobstructive.  3. Diabetes mellitus, insulin dependent.  4. Hypertension.  5. Obstructive sleep apnea, on BiPAP.  6. Chronic obstructive pulmonary disease, on home oxygen.   HOSPITAL COURSE:  Mr. Nijjar is a 60 year old male patient with known  coronary artery disease.  He has had previously a drug-eluting stent  placed in his right coronary artery in 2006.  He presents to [facility]  Emergency Room with chest pain that he describes as a sharp stabbing  pain in the left chest wall, but he states this is somewhat similar to  previous chest pain in the past.  His cardiac markers were negative, but  because of his symptoms, we proceeded with cardiac catheterization.  The  catheterization shows the following:  Left main normal.  LAD with mid-  vessel calcifications, and a 50% lucent stenosis, circumflex segmental  50-60% mid stenosis, RCA is found with a patent distal stent.  Moderate  LAD and circumflex lesions, but these were IVUS'ed and no critical  lesion was noted.  The patient was discharged to home.   DISCHARGE LABS:  TSH 2.010, BNP less than 30, magnesium 2.0, sodium 138,  potassium 3.7, BUN 15, creatinine 1.07.  CK-MB and troponins were  negative.   Chest x-ray, no acute process.   DISCHARGE MEDICATIONS:  Are not changed from admission medications.  These include:  1. Fluoxetine 20 mg t.i.d.  2. Lorazepam 0.5 mg t.i.d.  3. Spiriva inhaler daily.  4. Gabapentin 300 mg p.r.n.  5. Trilipix 135 mg a day.  6. Metformin 1000 mg twice a day and this  is not to be restarted until      June 18, 2008.  7. Omeprazole 20 mg a day.  8. Lisinopril 20 mg a day.  9. Vytorin 10/40 mg a day.  10.ProAir inhaler daily.  11.Sublingual nitroglycerin p.r.n. chest pain.  12.NovoLog 50 units in the morning, 30 units in the evening.   Clean the cath site gently with soap and water, no scrubbing.  Remain on  a low-sodium and heart-healthy diabetic diet.  Increase activity slowly.  No lifting over 10 pounds for 1 week, no driving for 2 days.  Follow up  with Dr. Effie Shy, nurse practitioner in 2 weeks.      Guy Franco, P.A.      Lyn Records, M.D.  Electronically Signed    LB/MEDQ  D:  06/15/2008  T:  06/16/2008  Job:  [id number]   cc:   Lyn Records, M.D.  Dr. Manson Passey

## 2011-03-20 NOTE — H&P (Signed)
NAMEVENICE, [name] NO.:  [id number]   MEDICAL RECORD NO.:  [id number]          PATIENT TYPE:  EMS   LOCATION:  MAJO                         FACILITY:  [facility]   PHYSICIAN:  Wendi Snipes, MD DATE OF BIRTH:  10-Apr-1951   DATE OF ADMISSION:  06/14/2008  DATE OF DISCHARGE:                              HISTORY & PHYSICAL   PRIMARY CARDIOLOGIST:  Lyn Records, M.D.   PRIMARY CARE PHYSICIAN:  [facility]   CHIEF COMPLAINTS:  Chest pain.   HISTORY OF PRESENT ILLNESS:  This is a 60 year old white male with a  past medical history significant for coronary artery disease status post  drug-eluting stent to the RCA in 2006.  Here with chest pain described  as a sharp stabbing pain in the left chest wall.  The patient states his  symptoms are somewhat similar to previous symptoms that required further  evaluation.  She states that the pain lasted around 20 minutes,  associated with mild nausea.  The pain improved after sublingual  nitroglycerin and completely resolved prior to EMS arrival at his home.  Other symptoms, the patient describes this as months of exertional chest  discomfort that is mild in nature.  However he states that it is slowly  progressive and has been becoming more frequent and severe over time.  Otherwise the patient has severe COPD that appears to be stable and  denies any paroxysmal nocturnal dyspnea, increased lower extremity  edema.  However, he states that he has not gained weight over the past  few weeks.   PAST MEDICAL HISTORY:  1. Coronary disease status post drug-eluting stent to RCA in February      2006.  2. Insulin-dependent diabetes mellitus.  3. Hypertension.  4. COPD on 1.5 liters of home O2.  5. Obstructive sleep apnea on BiPAP at night.   MEDICATIONS ON ADMISSION:  1. Trazodone 200 mg at night.  2. Triplex 135 mg daily.  3. Neurontin 300 mg 3 times daily as needed for pain.  4. Fluoxetine 20 mg at night  and 40 mg in the morning.  5. NovoLog 50 units subcu in the morning and 30 units subcu at night.  6. Ativan 0.5 mg as needed.  7. Spiriva 18 mcg daily.  8. Omeprazole 20 mg daily.  9. Metformin 1 g twice daily.  10.Vytorin 10/40 mg daily.  11.Lisinopril 10 mg daily.  12.Albuterol nebulizers 3 times daily.   SOCIAL HISTORY:  The patient smokes 2-3 packs daily.  He is disabled.   REVIEW OF SYSTEMS:  All 14 systems were reviewed and were negative  except for those mentioned in the HPI.   FAMILY HISTORY:  Noncontributory and reviewed.   PHYSICAL EXAMINATION:  VITAL SIGNS:  Blood pressure is 136/74.  His  pulse is 77 beats per minute.  He is satting 90% on 2 liters nasal  cannula.  Respirations 16 per minute.  GENERAL:  He is a 60 year old white male appearing stated age, in no  acute distress.  HEENT:  Moist mucous membranes.  Pupils are equal, round and react to  light  accommodation.  Anicteric sclera.  NECK:  No jugular venous distention.  No thyromegaly.  CARDIOVASCULAR:  Regular rate and rhythm.  No murmurs, rubs or gallops.  LUNGS:  Coarse breath sounds bilaterally.  Good air movement.  ABDOMEN:  Nontender, nondistended.  Positive bowel sounds.  No masses.  EXTREMITIES:  Warm, no clubbing, cyanosis or edema.  Pulses, 2+ pulses  throughout.  SKIN:  Warm, dry and intact.  No rashes.  NEUROLOGIC:  Alert and oriented x3.  Cranial nerves 2-12 grossly intact.  No focal neurologic deficits.   RADIOLOGY REVIEW:  Significant for COPD, but no acute pulmonary process  in comparison to previous studies.  EKG shows normal sinus rhythm with  no ST or T-wave abnormalities, suggestive of recent or ongoing ischemia.  Normal with premature ventricular contractions.   LABORATORY REVIEW:  White count is 12.9, hematocrit is 37.8.  Platelets  269.  His potassium is 4.6, creatinine was 1.07.  His initial troponin  is less than 0.05.   ASSESSMENT AND PLAN:  A 60yo male with coronary artery  disease status  post percutaneous coronary intervention of right coronary artery, here  with atypical chest pain and symptoms concerning for chronic and  progressive angina.  Unstable Angina - Will rule out myocardial infarction with 3 sets of  cardiac enzymes and repeat electrocardiograms if the patient's symptoms  return.  However, his electrocardiogram currently is nonischemic.  Will  continue his current medical therapy including aspirin and consider  treating his current symptoms of angina medically with long-acting  nitrates.  The patient may not tolerate beta-blocker secondary to severe  chronic obstructive pulmonary disease.  I believe his stabbing chest  pain today does not represent acute coronary syndrome.  However, if  there is any changes suggesting any ongoing ischemia, will treat with  full-dose anticoagulation. Consider non-invasive study to evaluate for  progressive disease. His last cardiac catheterization was apparently in  June 2008, which showed patent stents and no hemodynamically significant  stenosis and a normal left ventricular function.  If the pain persists,  he may warrant outpatient cardiac catheterization for his symptoms.      Wendi Snipes, MD  Electronically Signed     BHH/MEDQ  D:  06/14/2008  T:  06/14/2008  Job:  [id number]

## 2011-03-20 NOTE — Assessment & Plan Note (Signed)
[name]                             PULMONARY OFFICE NOTE   NAME:[name], [name]                    MRN:          [id number]  DATE:07/02/2007                            DOB:          02-13-1951    CHIEF COMPLAINT:  Worsening cough, dyspnea for the last few months.   HISTORY OF PRESENT ILLNESS:  [name] is a pleasant 60 year old male  who has multiple medical problems.  He was started on Xolair six months  ago by his physician for shortness of breath, cough and wheezing.  He  says he was kind of surprised that he was started on Xolair because he  never had any allergy testing done.  Nevertheless, it did not make any  improvement. He was also running out of money so he quit the Xolair  three to four weeks ago.   The reason he is here is that for the last six to eight months, and  particularly in the last three to four weeks, he has had a worsening in  his cough.  His sputum, which used to be clear, became green and is  becoming clearer now. He brings about four to five teaspoons per day.  The cough is so bad that it makes him dizzy.  He is coughing day and  night and has resulted in a loss of sleep.   In addition, this is associated with dyspnea.  The dyspnea has also been  around for six to eight months and may be up to a year. It is worsened  with exertion.  Currently he can walk 100 feet or one flight of stairs  and gets short of breath with doing household work.  He denies any  dyspnea while taking a shower or changing clothes.   No fever, no chills, no wheeze, no orthopnea, no edema.   PAST MEDICAL HISTORY:  1. Coronary artery disease.  He is status post cardiac catheterization      in June 2008 that showed a patent right coronary artery stent and      normal left ventricular function.  2. Hypertension.  3. Diabetes.  4. High cholesterol.  5. Obstructive sleep apnea, diagnosed two years ago and he has been      using CPAP on a  regular basis.  6. Abdominal pain, not otherwise specified. He has had a CT scan of      the abdomen on June 16, 2007 which was negative.  Ultrasound on      June 16, 2007 just showed fatty liver.  He is due for upper GI      endoscopy.  7. Chronic bronchitis for the last five to six years. He says that at      baseline he has very little cough with occasional white sputum.      However, he has also been diagnosed with asthma for the last one      year at [facility] and he is unclear as to why he      carries these two diagnoses.   PAST SURGICAL HISTORY:  1. Status post  rotator cuff repair greater than 20 years ago.  2. Status post carpal tunnel surgery in both hands in the remote past.   ALLERGIES:  No known drug allergies.   MEDICATIONS:  Currently not on any nasal inhalers but is taking aspirin,  ranitidine, simvastatin, fluoxetine, lisinopril, insulin, albuterol  nebulizer, ipratropium nebulizer, montelukast (or Singulair), Ambien,  Foradil, Asmanex, CPAP and oxygen as required.   SOCIAL HISTORY:  He currently smokes one pack a day and has been smoking  for the past 30 years.  He quit drinking 3 years ago.  In 1988 he used  to use cocaine but quit.  He lives with his wife.  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   FAMILY HISTORY:  Emphysema, mother.  Allergies, mother.  Heart disease,  father.   REVIEW OF SYSTEMS:  This is documented in detail in the questionnaire.  Main points in review of systems are long time dizziness for which he  has not been evaluated by neurology, post-nasal drip that has been there  for a long time.  In addition, he has had dyspnea, cough, irregular  heart beats, headaches, sneezing, joint stiffness, anxiety.  The rest of  the review of systems was negative.   PHYSICAL EXAMINATION:  VITAL SIGNS:  Weight of 291 pounds.  Temperature  is 98.1.  Blood pressure 128/66. Pulse  100.  Oxygen saturation 98% on  room air.  GENERAL APPEARANCE:  Obese male, seated comfortably in the exam room.  Cheerful.  CENTRAL NERVOUS SYSTEM:  Alert and oriented x3.  Speech and ambulation  are normal.  HEENT:  Poor dentition.  Mallampati class 3.  Short neck.  No evident  JVP.  No neck nodes.  Supple neck.  CHEST:  Respiratory exam with air entry equal on both sides.  Normal  breath sounds.  No crackles.  No wheeze.  CARDIOVASCULAR:  Normal heart sounds.  Regular rate and rhythm.  No  gallops.  No murmurs.  ABDOMEN:  Obese, soft.  Mild tenderness present in the right upper  quadrant, but otherwise soft, no organomegaly.  Normal bowel sounds.  EXTREMITIES:  Show no pedal edema.  No cyanosis, no clubbing.  Skin is  intact.  BACK:  Exam shows no kyphosis.  Joints otherwise normal.   LABORATORY DATA:  Baseline creatinine is 1.1.  Spirometry done on  August 15, 2006 in the outside facility shows an FEV1 of 2.8L, 82%  predicted, FVC of 3.8L, 82% predicted and normal ratio of 81.   ASSESSMENT/PLAN:  [name] is a 60 year old heavy smoker who has a  clinical diagnosis of chronic obstructive pulmonary disease and asthma.  He has had worsening symptoms and respiratory symptoms in the last six  months and particularly in the last three to four weeks.  Current  symptoms are consistent with diagnosis of acute exacerbation of chronic  obstructive pulmonary disease.  I do not know what to make of the normal  spirometry from the outside facility, but clearly his history is  consistent with a diagnosis of chronic obstructive pulmonary disease.   PROBLEMS:  1. I believe problem #1 is chronic obstructive pulmonary disease.  I      will check an alpha 1 antitrypsin level given the family history of      chronic obstructive pulmonary disease.  I will treat today's      exacerbation with Z-pak and prednisone.  I will also  institute him      on maintenance Spiriva, one puff daily and Flonase  nasal inhaler      daily.  This should take care of postnasal drip and cough related      to that and also result in long term bronchodilation and symptom      relief.  I advised him Pneumovax but he has had this in 2007 and he      states he is regular with his annual flu shots.   1. Smoking.  He knows he has to quit.  I said will investigate about a      quit smoking program.  We will discuss this at his next visit.   1. Dizziness.  At the next visit we will refer him to a neurologist.      I suspect this could be cough-related vasovagal dizziness.   FOLLOWUP:  He will return to clinic in one month with full pulmonary  function tests at that time and a chest x-ray.  Hopefully, he is past  this exacerbation then and so will get a sense of his baseline.  At the  next visit I will also consider a referral to pulmonary rehabilitation.     Kalman Shan, MD  Electronically Signed    MR/MedQ  DD: 07/02/2007  DT: 07/03/2007  Job #: [id number]

## 2011-03-20 NOTE — Discharge Summary (Signed)
NAMEBAUDELIO, KARNES             ACCOUNT NO.:  [id number]   MEDICAL RECORD NO.:  [id number]          PATIENT TYPE:  INP   LOCATION:  2921                         FACILITY:  [facility]   PHYSICIAN:  Lyn Records, M.D.   DATE OF BIRTH:  1951-05-30   DATE OF ADMISSION:  12/19/2008  DATE OF DISCHARGE:  12/20/2008                               DISCHARGE SUMMARY   DISCHARGE DIAGNOSES:  1. Syncope.  2. Chest pain, resolved.  3. Known coronary artery disease, status post drug-eluting stent to      the right coronary artery in the past.  4. Diabetes mellitus.  5. Hypertension.  6. Chronic obstructive pulmonary disease on home oxygen.  7. Obstructive sleep apnea on BiPAP.  8. Long-term medication use.   HOSPITAL COURSE:  Mr. Rahal is a 60 year old male patient with known  coronary artery disease.  The patient states his family made him come to  the emergency room after having witnessed him passed out twice.  The  wife heard him fall in the other room twice and reported that he  regained consciousness very quickly after his syncopal episode.   The patient does complain of some chest pain that he has on a daily  basis.  It never goes away and remains about 4/12 in magnitude.  He also  states that in addition to his chest pain, he has had syncopal episodes  several times per week and this has been going on for months.  He  describes his syncope as turning off a light switch.  He denies any  palpitations prior to these episodes.   The patient was brought into the hospital.  Lab work was obtained.  TSH  was 3.981.  Hemoglobin A1c 7.8.  His cardiac enzymes were normal.  PT  13.1, INR 1.0, BNP 55, magnesium 1.4, repleted, total cholesterol 129,  triglycerides 236, HDL 19, LDL 63.  Sodium 138, potassium 3.9, BUN 11,  creatinine 1.0, hemoglobin 12.9, hematocrit 36.5, white count 7.6,  platelets 190, and D-dimer 1.33.   A CT angio of the chest showed no evidence of pulmonary embolism.  There  was borderline to mildly enlarged mediastinal lymph node.  A followup  chest CT in 6 months to evaluate for stability was recommended.  The  radiologist felt the overall appearance was benign.   As far as his syncope workup, we did seek the expertise of Dr. Sherryl Manges, his consult is not yet typed, but has been dictated, but in  general, he wanted [place] to check orthostatic blood pressures on the  patient.  The patient was not orthostatic.  If he had been orthostatic,  he recommended the use of midodrine, although this was not the case.  He  said to consider pressure-counter maneuvers such as support hose.  He  felt that it was a more neurocardiogenic source.  He felt that the main  thing was for the patient not to drive for 3 weeks.   The patient has a followup appointment in 1 week to see Dr. Katrinka Blazing,  December 27, 2008, at 3 p.m.   The patient is  to remain on low-sodium heart-healthy diabetic diet,  increase activity slowly.  He is not to drive for 3 weeks.   DISCHARGE MEDICATIONS:  1. Vytorin 10/40 one tablet daily.  2. Omeprazole 20 mg twice a day.  3. Fluoxetine 40 mg a day.  4. Lisinopril 10 mg a day.  5. Metformin 1000 mg twice a day.  6. Sublingual nitroglycerin p.r.n. chest pain.  7. Ativan 0.5 mg p.o. b.i.d. p.r.n.  8. Spiriva daily.  9. Albuterol p.r.n. wheezing.  10.Neurontin 300 mg 3 times a day.  11.Baby aspirin 81 mg a day.  12.Trazodone 100 mg at bedtime.   The patient is being discharged to home in stable, but improved  condition.  He is no longer having chest pain.  Again, he is not to  drive for 3 weeks.      Guy Franco, P.A.      Lyn Records, M.D.  Electronically Signed    LB/MEDQ  D:  12/20/2008  T:  12/21/2008  Job:  [id number]   cc:   Duke Salvia, MD, [facility]  Lyn Records, M.D.

## 2011-03-20 NOTE — Assessment & Plan Note (Signed)
[facility]                             PULMONARY OFFICE NOTE   NAME:PUCKETTCalyx, Hawker                    MRN:          [id number]  DATE:07/31/2007                            DOB:          1951-08-31    PROBLEM LIST:  1. Clinic visit June 30, 2007 for acute exacerbation chronic      obstructive pulmonary disease by clinical history.  2. Isolated low DLCO with normal lung fuction on July 31, 2007 in      stable state.  3. Trying to quit Smoking, currently on Chantix.  4. Chronic cough, sputum production - clinically consistent with      chronic bronchitis even though lung fuction is normal.  5. Chronic dyspnea on exertion, etiology multifactorial includes      possible copd, low DLCO and possible deconditioning.  6. Longstanding dizziness, Neurologist referral awaited.   HISTORY OF PRESENT ILLNESS:  Mr. Sawaya returns for followup, last  visit was on July 02, 2007.  At that time I started him on  antibiotics, steroids, Spiriva, Asmanex for what clinically sounded like  a COPD exacerbation.  Since then he says he feels 80% better.  Cough is  diminished. However, it is still present particularly at night and he  has some occasional dry spells of cough.  He also brings up 2 teaspoons  of white sputum, I am unclear if this is 2 teaspoons for the whole day  or at each bout of cough.  The color of the sputum is definitely  decreased since his last visit. In terms of dyspnea he says he is a  lot better.  Now he is able to walk normally to the mailbox and  clearly walk 500-600 feet without any dyspnea.   He has other symptoms, particularly dizziness for which he is going to  see a Neurologist pretty soon.   PAST MEDICAL HISTORY:  This is unchanged from July 02, 2007.   ALLERGIES:  UNCHANGED FROM July 02, 2007.   MEDICATIONS:  Unchanged from July 02, 2007 except for the fact he is  now on Spiriva 1 puff daily and also on Chantix once  daily.  He has  finished his antibiotic and prednisone.   SOCIAL HISTORY:  Unchanged from July 02, 2007.   FAMILY HISTORY:  No change since July 02, 2007.   REVIEW OF SYSTEMS:  Improved symptoms since 2008.   PHYSICAL EXAMINATION:  VITAL SIGNS:  Weight 289 pounds, temperature  97.8, blood pressure 110/58, pulse ox 95% on room air.  Physical exam is essentially unchanged from July 02, 2007.  GENERAL APPEARANCE:  Obese male seated comfortably in the exam room  chair.  CENTRAL NERVOUS SYSTEM:  Alert and oriented x3, speech and ambulation  normal.  HEENT:  Full dentition, Mallampati class 2, short neck, no evident JVP,  no neck nodes.  CHEST/RESPIRATORY:  Equal air entry on both sides, normal breath sounds.  CARDIOVASCULAR:  Normal heart sounds, regular rate and rhythm, no  gallops or murmurs.  ABDOMEN:  Obese, soft, no tenderness.  EXTREMITIES:  No pedal edema, no cyanosis, no clubbing.  BACK:  No kyphosis, no scoliosis.  JOINT:  Normal.   ASSESSMENT AND PLAN:  Mr. Orsak, heavy smoker, who is recently trying  to quit.  At last visit he had symptoms of chronic obstructive pulmonary  disease exacerbation.  He has had tremendous improvement with antibiotic  and prednisone bursts.  He feels 80% improved in terms of his cough and  dyspnea although he still has some residual baseline symptoms.  His  history and story is very consistent with chronic obstructive pulmonary  disease and recent chronic obstructive pulmonary disease exacerbation.  However  followup pulmonary function tests at stable health are  essentially normal except for an isolated low carbon monoxide diffusion  in the lung.  Possible etiologies include COPD presenting as isolated  low DLCO, and/or asthma component that has reversed wtih treatment or  other etiologies like fibrosis.   PLAN   1. Isolated low DLCO. I will get a high resolution CT scan of the      chest and if this is noncontributory we should get  a      cardiopulmonary exercise testing.  2. Cough and shortness of breath, although these are tremendously      improved these still present at some low residual levels.  I      suspect COPD as the etiology but will monitor this once we have the      results of the high-res CT and the CPET.  3. Smoking, I have applauded his efforts to quit with Chantix and I      have cautioned him about the different side effects of Chantix.  4. Dizziness, he is going to see a Neurologist.   He will return to clinic after his high resolution CT scan of the chest  scheduled for August 04, 2007.     Kalman Shan, MD  Electronically Signed    MR/MedQ  DD: 08/05/2007  DT: 08/05/2007  Job #: [id number]

## 2011-03-20 NOTE — Cardiovascular Report (Signed)
NAMEDAMION, KANT             ACCOUNT NO.:  [id number]   MEDICAL RECORD NO.:  [id number]          PATIENT TYPE:  INP   LOCATION:  3734                         FACILITY:  [facility]   PHYSICIAN:  Corky Crafts, MDDATE OF BIRTH:  December 28, 1950   DATE OF PROCEDURE:  04/11/2007  DATE OF DISCHARGE:                            CARDIAC CATHETERIZATION   PROCEDURES PERFORMED:  Left heart catheterization, left ventriculogram,  coronary angiogram, abdominal aortogram.   OPERATOR:  Dr. Eldridge Dace.   INDICATIONS:  Unstable angina.   PROCEDURE NARRATIVE:  The risks and benefits of cardiac catheterization  were explained to the patient and informed consent was obtained.  The  patient was brought to the cath lab.  He was prepped and draped in the  usual sterile fashion.  His right groin was infiltrated with 1%  lidocaine.  A 6-French arterial sheath was placed into his right femoral  artery using the modified Seldinger technique.  Left coronary artery  angiography was then performed using JL-4.0 catheter.  The catheter was  advanced to the vessel ostium under fluoroscopic guidance.  Digital  angiography was performed in multiple projections using hand injection  of contrast.  Right coronary artery angiography was then performed using  a JR-4.0 catheter.  The catheter was advanced to the vessel ostium under  fluoroscopic guidance.  Digital angiography was performed in multiple  projections using hand injection of contrast.  Pigtail catheter was  advanced to the ascending aorta and across the aortic valve.  Power  injection of contrast was performed in the RAO projection to image the  left ventricle.  The catheter was pulled back under continuous  hemodynamic pressure monitoring.  The catheter was then pulled back to  the level of the renal arteries and a power injection of contrast was  performed in the AP projection.  A StarClose was deployed for  hemostasis.   FINDINGS:  The left main was a  large short vessel which is widely  patent.  The left circumflex is a medium-sized vessel which was widely  patent with mild irregularities predominantly in the mid portion.  The  OM-1 was a medium-sized vessel with an early takeoff.  This was widely  patent.  The OM-2 was a medium-sized vessel and was widely patent.  The  left anterior descending was a medium- to large-sized vessel with mild  diffuse irregularities, especially in the midportion of the vessel.  There is a small D-1 and a medium-sized D-2.  The right coronary artery  was a large dominant vessel with mild irregularities.  There is a patent  distal RCA stent.  The left ventriculogram showed normal left  ventricular function with an estimated ejection fraction of 55-60%.   HEMODYNAMICS:  Left ventricular pressure of 110/16 with an LVEDP of 19  mmHg.  Aortic pressure of 110/68 with a mean aortic pressure of 86 mmHg.  The abdominal aortogram showed no abdominal aortic aneurysm.  There is a  single left renal artery which was widely patent.  There is dual  arterial supply to the right kidney.  Both arteries were widely patent.   IMPRESSION:  1.  Patent right coronary artery stent.  2. Moderate atherosclerosis.  There is no hemodynamically significant      stenosis.  3. Normal left ventricular function.  4. No renal artery stenosis.   RECOMMENDATIONS:  The patient will continue his aggressive medical  therapy, including beta blockade and lipid lowering therapy along with  aspirin.  He will be discharged later today.      Corky Crafts, MD  Electronically Signed     JSV/MEDQ  D:  04/11/2007  T:  04/11/2007  Job:  [id number]

## 2011-03-20 NOTE — Assessment & Plan Note (Signed)
[facility]                             PULMONARY OFFICE NOTE   NAME:PUCKETTWendal, Wilkie                    MRN:          [id number]  DATE:08/12/2007                            DOB:          February 04, 1951    CLINICAL PROBLEMS:  1. Isolated low DLCO. 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  2. 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 Chronic obstructive pulmonary disease exacerbation, August 2008.  4. Trying to quit smoking, currently on Chantix.  5. Chronic cough, sputum production, related to chronic obstructive      pulmonary disease.  6. Chronic dyspnea, etiology is chronic obstructive pulmonary disease      and possible deconditioning.  7. Long-standing dizziness, neurologist evaluation pending.  8. 1cm shotty pretracheal lymph node on CT chest Oct 2008   HISTORY OF PRESENT ILLNESS:  Mr. Cretella returns after having a CT scan  of the chest and to get the results.  He reports no symptoms or any  change since his prior visit on July 31, 2007.   PAST MEDICAL HISTORY:  Allergies, medications, social history, family  history, review of systems, all unchanged from prior visits.   PHYSICAL EXAMINATION:  This was not done today.   LABORATORY EVALUATION:  CT scan of the chest, October 2008.  I  personally reviewed the CT scan of the chest.  There is a presence of  bullous disease consistent with chronic obstructive pulmonary disease,  but there is also some suggestion of early pulmonary fibrosis in the  right lung (my personal opinion).  This is not there in the official  report.  The official report is consistent with chronic obstructive  pulmonary disease.   ASSESSMENT AND PLAN:  Symptoms of chronic cough and dyspnea in a smoker  and isolated low DLCO are c/w COPD.  Alternatively, associated pulmonary  fibrosis might result in normal lung fuction but  only with severe  reductions in DLCO. In this situation, reduction in dlco is not severe.  Moreover, treatment of copd exacerbation has singnificantly improved  symptoms and he is nearly asymptomatic now.   Plan is:  Manage for COPD. Atttend pulmonary rehab.  I will see him again after 6 months.  If dyspnea worsens, will be a good time to obtain future CT scans. Until  that time, no specific  intervention.  For 1 cm pretracheal node, will get repeat CT in 9-12 months     Kalman Shan, MD  Electronically Signed    MR/MedQ  DD: 08/12/2007  DT: 08/13/2007  Job #: [id number]

## 2011-03-20 NOTE — H&P (Signed)
[name], [name]             ACCOUNT NO.:  [id number]   MEDICAL RECORD NO.:  [id number]          PATIENT TYPE:  EMS   LOCATION:  MAJO                         FACILITY:  [facility]   PHYSICIAN:  Vernice Jefferson, MD          DATE OF BIRTH:  [birth year]   DATE OF ADMISSION:  04/10/2007  DATE OF DISCHARGE:                              HISTORY & PHYSICAL   PRIMARY CARDIOLOGIST:  Lyn Records, M.D.   PRIMARY CARE PHYSICIAN:  [facility].   CHIEF COMPLAINT:  Chest pain.   HISTORY OF PRESENT ILLNESS:  Patient is a 60 year old African-American  male with a history of coronary artery disease, diabetes, hypertension,  who comes in with chest pain that occurred today while sitting on the  couch.  Patient states that he does have stable angina, gets 3-4  episodes of this every week; however, this was a little different in  that it was associated with nausea, diaphoresis, and the intensity was  slightly increased.  The pain lasted for 30 minutes, went away after two  nitroglycerin.  The patient last saw a cardiologist approximately one  year ago.   PAST MEDICAL HISTORY:  1. Coronary artery disease, status post Taxus stent to his RCA in      February, 2006.  There was some other nonsignificant plaquing in      the circumflex and LAD territories.  2. Diabetes mellitus.  3. Hypertension.  4. Obstructive sleep apnea.  5. COPD, on home O2 at 1.5 liters.   MEDICATIONS:  1. Aspirin 81 mg daily.  2. Metformin 500 mg b.i.d.  3. Zocor 40 mg p.o. nightly.  4. Toprol 50 mg b.i.d.   Patient has stopped his Plavix approximately one year ago.   ALLERGIES:  No known drug allergies.   SOCIAL HISTORY:  Lives in [place].  He is a 30-pack-year smoker.  No  children.   FAMILY HISTORY:  Negative.  It is reviewed and is noncontributory to the  patient's current illness.   REVIEW OF SYSTEMS:  Negative 11-point review of systems except for  otherwise dictated in the above HPI.   PHYSICAL EXAMINATION:  VITAL SIGNS:  Blood pressure is 103/59, heart  rate 89, temperature afebrile.  Respirations are 12.  GENERAL:  A well-developed, well-nourished, obese male in no acute  distress.  HEENT:  Moist mucous membranes.  No scleral icterus.  No conjunctival  pallor.  NECK:  Supple with full range of motion.  No jugular venous distention.  No carotid bruits noted.  CHEST:  Clear to auscultation bilaterally with no rales, wheezes, or  rhonchi.  CARDIOVASCULAR:  Regular rate and rhythm.  No murmurs, rubs or gallops.  ABDOMEN:  Soft, nontender.  Obese with normoactive bowel sounds.  No  rebound or guarding.  EXTREMITIES:  Trace pretibial edema, otherwise pulses are 2+  bilaterally.  NEURO: Nonfocal.   Chest x-ray demonstrates some mild peribronchial thickening with no  acute changes.   EKG demonstrates a normal sinus rhythm with premature atrial  contractions with nonspecific T wave abnormality anteroseptally, which  is unchanged from his  prior EKG.   LABORATORY DATA:  White count 19.5, hemoglobin 13.2, platelets 239.  Metabolic panel significant for a BUN and creatinine of 24 and 1.2.  LFTs within normal limits.  The first set of cardiac enzymes are  negative.   IMPRESSION:  1. Acute coronary syndrome, unstable angina.  2. Diabetes mellitus.  3. Hypertension.  4. Chronic obstructive pulmonary disease.  5. Obstructive sleep apnea.   PLAN:  Will admit the patient to telemetry monitoring, rule out for  myocardial infarction by serial enzymes.  Aspirin and heparin tonight  for anticoagulation strategy.  Beta blocker and statin therapy for  further cardiovascular risk reduction.  Keep him n.p.o. after midnight  for a noninvasive versus left heart cath in the a.m. to be determined by  Dr. Katrinka Blazing.      Vernice Jefferson, MD  Electronically Signed     JT/MEDQ  D:  04/11/2007  T:  04/11/2007  Job:  [id number]

## 2011-03-20 NOTE — Discharge Summary (Signed)
[name], [name]             ACCOUNT NO.:  [id number]   MEDICAL RECORD NO.:  [id number]          PATIENT TYPE:  INP   LOCATION:  6715                         FACILITY:  [facility]   PHYSICIAN:  Eduard Clos, MDDATE OF BIRTH:  1950-12-22   DATE OF ADMISSION:  01/05/2009  DATE OF DISCHARGE:  01/08/2009                               DISCHARGE SUMMARY   COURSE IN THE HOSPITAL:  This is a 60 year old male with known history  of COPD, obstructive sleep apnea, CAD, status post stenting,  hypertension, and diabetes mellitus type 2, presenting with complaints  of shortness of breath and also was found to be afebrile.  On admission,  the patient had a chest x-ray, which showed features of atypical  pneumonia.  The patient was admitted and started on empiric antibiotics.  Subsequently, the patient had a CT chest, which showed upper lobe  predominant bilateral peribronchial vascular and peripheral ground-glass  opacity.  The patient's condition gradually improved on steroids and  antibiotics, and at this time as the patient's condition has  significantly improved, we will be discharging him home after  evaluation.  At the time of this dictation, the patient is  hemodynamically stable.   PROCEDURE:  1. Done during the stay.  Chest x-ray on January 05, 2009 showed a      interstitial prominences and indistinctness, that can be seen with      edema or viral atypical pneumonia.  2. CT of the chest on January 06, 2009, shows limited elevation of distal      pulmonary artery vessels due to respiratory motion.  No central      segmental pulmonary embolism, upper lobe predominant bilateral      peribronchial vascular and peripheral ground-glass opacity with      some areas of clear air space consolidation.  Favor acute viral or      atypical pneumonia associated with mild hilar mediastinal      lymphadenopathy is clinically reactive.   PERTINENT LABS:  On January 07, 2009, WBC was 8, hemoglobin  13.1,  creatinine was 1, and hemoglobin A1c was 7.3.   FINAL DIAGNOSES:  1. Atypical pneumonia.  2. Chronic obstructive pulmonary disease exacerbation.  3. Obstructive sleep apnea.  4. Diabetes mellitus type 2.  5. Hypertension.  6. Coronary artery disease, status post stenting.  7. Hyperlipidemia.   DISCHARGE MEDICATIONS:  1. Vytorin 10/400 mg p.o. daily.  2. Lisinopril 10 mg p.o. daily.  3. Metformin 1 g p.o. b.i.d.  4. Albuterol and Atrovent nebulizer q.6 h. p.r.n. for wheezing.  5. Neurontin 300 mg p.o. t.i.d.  6. Aspirin 81 mg p.o. daily.  7. Trazodone 100 mg p.o. at bedtime.  8. Prilosec 20 mg p.o. daily.  9. Prozac 40 mg p.o. in the a.m. and 20 at 8 p.m.  10.NPH 50 units subcu in a.m. and 30 units subcu 3 p.m.  11.Trilipix 135 mg p.o. daily.  12.Avelox 1 mg p.o. daily for 7 days and stop.  13.Prednisone tapering dose.  14.Nicotine patch.   PLAN:  The patient advised to follow up with his primary care physician  within a week's time to recheck CBC and BMET.  Follow up with Dr. Kalman Shan at [facility] in 1-2 weeks for further work up on his  abnormal CT chest showing a ground-glass opacities.  I did discuss with  Dr. Delford Field about this who is planing to schedule a followup with Dr.  Marchelle Gearing.  The patient is to be on cardiac healthy, carb modified diet,  to be on activity as tolerated with fall precautions.  He will be  discharged home after home O2 evaluation.      Eduard Clos, MD  Electronically Signed     ANK/MEDQ  D:  01/08/2009  T:  01/09/2009  Job:  [id number]

## 2011-03-20 NOTE — Consult Note (Signed)
Keith, Weaver             ACCOUNT NO.:  [id number]   MEDICAL RECORD NO.:  [id number]          PATIENT TYPE:  INP   LOCATION:  2037                         FACILITY:  [facility]   PHYSICIAN:  Gustavus Messing. Orlin Weaver, M.D.DATE OF BIRTH:  08-20-1951   DATE OF CONSULTATION:  11/14/2007  DATE OF DISCHARGE:                                 CONSULTATION   REASON FOR CONSULTATION:  Syncope versus seizure.   HISTORY OF PRESENT ILLNESS:  [name] is a 60 year old, right-handed,  white man who was admitted for a syncopal episode with some transient  left hemiparesis associated with it.  He also had chest pain.  He was  admitted to cardiology because of that.  He stated he had gotten up to  the bathroom, he felt swimmy-headed, passed out, hit his right knee and  forehead and then had some transient left-sided weakness which has since  recovered.  He actually has been followed by Dr. Anne Hahn for some  dizziness since September 2008.  Dr. Anne Hahn found that he had chronic  dizziness, daily headaches and tinnitus.  He ordered MRI of the brain  and MRA intracranially.  The MRI of the brain showed nonspecific white  matter disease, but nothing acute.  MRA showed no significant stenosis  of medium to large sized vessel, no aneurysms.  He did note that the  right pupil artery appeared smaller and felt that it could be  congenital, but there was not an MRA of the neck.  A brainstem auditory  evoked response was borderline.  Dr. Anne Hahn' notes indicate that he felt  that the patient had dizziness and vertigo that were likely of a central  origin based on the mild abnormality.  He thought he might have minimal  medullary ischemia leading to some of the symptoms.  He also had some  right occipital headache.  He was being treated with low doses of  benzodiazepines for his symptoms and was on one aspirin a day prior to  his being admitted with these events.  He does not have convulsive  activity, no  incontinence or tongue biting.  He does have some mild  slurred speech, but no diplopia or other visual symptoms associated with  it.   REVIEW OF SYSTEMS:  A 13-system review is positive for the dizziness,  chest pain, dysphagia which is old, this transient left-sided weakness  which is new, some dysuria and hematuria.  No fever, chills, nausea,  vomiting, diarrhea or constipation.   PAST MEDICAL HISTORY:  1. Type 2 diabetes on insulin.  2. Morbid obesity.  3. Hypertension.  4. COPD.  5. Obstructive sleep apnea.  6. Coronary artery disease, status post stent.  7. Hypercholesterolemia.  8. Diabetes.  9. Depression/anxiety.  10.Diabetic peripheral neuropathy.  11.Rotator cuff repair.  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   HOME MEDICATIONS:  Aspirin, Tricor, Spiriva, omeprazole, metformin,  Zocor, lisinopril, insulin, Ambien, Ativan.  CPAP for his sleep apnea.   ALLERGIES:  NO KNOWN DRUG ALLERGIES.   SOCIAL HISTORY:  He is married and lives in the [place] area with an  11th grade education.  He is on Disability since 2004.  He has two  daughters and one son.   FAMILY HISTORY:  Positive for heart disease, hypertension,  hypercholesterolemia, diabetes and cancer.   OBJECTIVE:  VITAL SIGNS:  He did have some orthostatic blood pressures  checked.  While lying down, blood pressure 116/69 with a pulse of 92;  sitting, there is a slight drop to 108/71 with a pulse going up to 101;  standing, however, it goes back up to 115/76 and pulse of 102.  Temperature is 96.8, respirations 18.  HEENT:  Head is normocephalic, atraumatic.  NECK:  Supple without bruits.  HEART:  Regular rate and rhythm.  LUNGS:  Clear to auscultation.  NEUROLOGIC:  Mental status:  Awake and alert, fully oriented with normal  language and cognition.  On cranial nerve exam, pupils are equal and  reactive.  Visual fields are full to confrontation.  Extraocular  movements are intact, although he has  somewhat saccadic fine movements,  but it is symmetric bilaterally.  There is no nystagmus associated with  it.  There is no facial asymmetry.  He does report some decreased  sensation in the left distribution which are chronic and rather  subjective.  Hearing is intact.  Palate symmetric and tongue is midline.  On motor exam, there is no drift or satelliting, normal rapid fine  movements, normal bulk, tone and strength throughout.  He has normal  station and gait.  Deep tendon reflexes are mildly diminished diffusely,  but absent in the right knee jerk.  He says that is also old.  He has  downgoing toes to plantar stimulation.  Coordination:  Finger-to-nose,  rapid alternating movements, heel-to-shin are normal.  Tandem gait is  wobbly, but intact.  Sensory exam is intact.   LABORATORY DATA AND X-RAY FINDINGS:  CT of the brain is essentially  normal.   IMPRESSION:  Syncope and near syncope followed by Dr. Anne Hahn, now with  transient left hemiparesis which is concerning for vertebrobasilar  insufficiency.   RECOMMENDATIONS:  Would recheck MRI scan of the brain, MRA of the head  without contrast and MRA of the neck with contrast to rule out  vertebrobasilar insufficiency.  I do not think this is a seizure.  EEG  is not likely to be helpful.  He is already on aspirin.  If there is  evidence of acute disease, we may need to consider Aggrenox or Plavix.      [name], M.D.  Electronically Signed     CAW/MEDQ  D:  11/14/2007  T:  11/14/2007  Job:  [id number]

## 2011-03-20 NOTE — Consult Note (Signed)
NAMESTARK, AGUINAGA             ACCOUNT NO.:  [id number]   MEDICAL RECORD NO.:  [id number]          PATIENT TYPE:  INP   LOCATION:  2921                         FACILITY:  [facility]   PHYSICIAN:  Duke Salvia, MD, FACCDATE OF BIRTH:  1951-11-02   DATE OF CONSULTATION:  12/20/2008  DATE OF DISCHARGE:  12/20/2008                                 CONSULTATION   Thank you very much for asking [place] to see [name] in  electrophysiological consultation for recurrent syncope.   [name] is a 60 year old gentleman disabled because of his back, he  has a history of coronary artery disease, status post DES stenting in  2006 with a catheterization in 2009 demonstrating moderate, but  nonobstructive coronary artery disease.  His ejection fraction was  normal.  He has no known myocardial infarction.   His other past medical issues include diabetes, hypertension,  obstructive sleep apnea, on BiPAP and home O2.   He was admitted with syncope.  The episode occurred after having  defecated, stood up, and he fell on the floor.  His wife helped him get  to the bed and then he fell off the bed.  His episode began with a  stereotypical prodrome characterized by flushing diaphoresis.  Many of  his episodes are accompanied by nausea, but not all.  Some of them are  accompanied by chest pain, but not all.  There is some residual  orthostatic intolerance.  He has never been told that he is pale.   He has separate orthostatic intolerance manifested upon standing from a  chair, standing from the car, and shower intolerance as well.   These symptoms go back to his teens.  He had recurrent syncope while  playing football and basketball; all of these episodes occurred  following exertion in the standing-resting phase.  They then remitted  for awhile, only it recurred in the last 10-15 years.  He says that he  has had probably 5-6 episodes over the last couple of months.   He is quite clear and none  of these episodes have occurred while sitting  or lying down.  This episode the other day where he fell off the bed is  clearly different from other spells.   PAST MEDICAL HISTORY:  Notable for polysubstance abuse.  About 20 years  ago, he was taking cocaine.  He was put on fluoxetine to help him wean  himself from that and he has not used cocaine since he is ill, so not  used alcohol from last 5 years.  He does continue to smoke.   His past medical history is notable primarily as above.   REVIEW OF SYSTEMS:  Notable for chronic chest pain and is otherwise  broadly negative across multiple organ systems as outlined on the intake  sheet.   MEDICATIONS AT HOME:  1. Vytorin 10/40.  2. Omeprazole 20 b.i.d.  3. Fluoxetine 40/20.  4. Lisinopril 10.  5. Metformin 1000 b.i.d.  6. Nitroglycerin 37.5 b.i.d.  7. Spiriva 18 nightly.  8. Albuterol.  9. Neurontin 300 t.i.d.  10.Aspirin.  11.Trazodone 100.   SOCIAL  HISTORY:  He is married.  He lives with his wife.  He continues  to smoke cigarettes 2 packs per day.  He is a disabled Naval architect.   PHYSICAL EXAMINATION:  GENERAL:  He is a middle-aged Caucasian male  appearing to be older than stated age of 60.  VITAL SIGNS:  His blood pressure is 134/84.  His pulse is 81.  Orthostatics demonstrated change in his heart rate from 79-95 while  standing at 0 minutes, prolonged standing was not measured.  NECK:  Veins were difficult for me to discern, but were appeared to be 6-  7 cm.  There are carotids were brisk and full.  There is no  lymphadenopathy.  HEENT:  Demonstrated no icterus or xanthomata.  BACK:  Without kyphosis or scoliosis or a couple of wheezes.  HEART:  Sounds were irregular without significant murmurs.  ABDOMEN:  Protuberant, but soft.  Femoral pulses were not palpable.  EXTREMITIES:  Distal pulses were intact.  There is no clubbing or  cyanosis.  NEUROLOGIC:  Grossly normal.  SKIN:  Warm and dry.   Chest CT done  yesterday demonstrated no pulmonary embolism.   Electrocardiogram dated this morning at 7:36 demonstrated sinus rhythm  at 82 with intervals of 0.16/0.09/0.36.  There were frequent  interpolated PVCs with a left bundle superior axis morphology.   IMPRESSION:  1. 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  2. Some orthostatic intolerance with objective evidence of heart rate      increasing with brief standing.  3. Coronary artery disease with:      a.     Normal left ventricular function.      b.     Prior right coronary artery - right coronary artery - drug-       eluting stent - 2006.      c.     No prior myocardial infarction.  4. Diabetes.  5. Obesity.  6. Premature ventricular contraction.  7. Obstructive sleep apnea/chronic obstructive pulmonary disease, on      oxygen.  8. Fluoxetine therapy.   DISCUSSION:  [name] history is consistent with neurocardiogenic  syncope.  Almost all the spells have occurred while standing and are  characterized with a stereotypical prodrome of flushing diaphoresis and  occasional nausea; there is some residual of orthostatic intolerance.  I  suspect that his diabetes contributes to this as does longstanding  hypertension and its effect on vascular compliance.  There are also  remote reports about the presyncopal effects of some of the SSRIs and I  will need to look up whether fluoxetine is in this list.   Given the longstanding nature of these over decades, I think further  invasive evaluation is not necessary; tilt-table testing would only help  to confirm but this is suspicion sufficiently high that a negative tilt-  table test would not swayed me from the aforementioned diagnosis.   Treatment options would include therapeutic maneuvers to counteract  orthostatic stress, specifically leg rocking, isometric exercise with  legs prior to standing.  I have also advised him to be aware of  provocative situations,  i.e. heat, swimming pools, etc. that can promote  venous pooling.   In the event that symptoms persist and if objective data can be obtained  demonstrating orthostatic tachycardia or hypotension, Midrin and/or  ProAmatine might be useful as medicinal therapies and support stockings  might be useful as a nonmedicinal therapy.   RECOMMENDATIONS:  Based on the above, therefore  I would:  1. Not pursue any further testing at this time.  2. Utilize leg antigravity maneuvers.  3. Repeat orthostatic vital signs with prolonged standing.  4. No driving x3 weeks given the recent episode of falling out of      bed and not being sure what that reflects for encouragement to      stop smoking.  5. We will need to review the issue of fluoxetine and presyncopal      effects.   Thanks for the consultation.      Duke Salvia, MD, [facility]  Electronically Signed     SCK/MEDQ  D:  12/20/2008  T:  12/21/2008  Job:  [id number]   cc:   Lyn Records, M.D.  [facility]

## 2011-03-20 NOTE — H&P (Signed)
[name], [name]             ACCOUNT NO.:  [id number]   MEDICAL RECORD NO.:  [id number]          PATIENT TYPE:  EMS   LOCATION:  MAJO                         FACILITY:  [facility]   PHYSICIAN:  Wendi Snipes, MD DATE OF BIRTH:  [birth year]   DATE OF ADMISSION:  12/19/2008  DATE OF DISCHARGE:                              HISTORY & PHYSICAL   TIME OF SERVICE:  8:30 p.m.   CARDIOLOGIST:  Lyn Records, M.D.   PRIMARY DOCTOR:  [facility]   CHIEF COMPLAINTS:  Chest pain and syncope.   HISTORY OF PRESENT ILLNESS:  This is a 60 year old white male with a  history of coronary artery disease status post drug-eluting stent to the  right coronary artery who presents with chronic chest pain and syncope.  The patient states that his family made him come to the ED after having  witnessed him pass out twice.  His wife endorses hearing him fall in the  other room twice and reporting that he regained consciousness very  quickly after his syncopal episode.  The patient does state that he has  constant chest pain on a daily basis.  He states that this chest pain  does never goes away and remains at about 4/10 in magnitude.  He also  states that he has syncopal episodes several times per week and this has  been going on for months.  He describes the syncope as turning off a  light switch and denies any prodrome or palpitations prior to these  episodes.  He is currently at his baseline shortness of breath and  denies any new symptoms such as shortness of breath, paroxysmal  nocturnal dyspnea, orthopnea or increased lower extremity edema. He also  states that these episodes have been worked up in the past with  outpatient Holter monitor and neurology outpatient consultation, though  he remains undiagnosed, and the symptoms currently persist.  The patient  is very apprehensive about his hospital admission and only will consent  for admission at the family's request.   PAST  MEDICAL HISTORY:  1. Coronary disease status post drug-eluting stent to the RCA in      February 2006.  Most recent cardiac catheterization in August 2009      showed moderate nonobstructive coronary disease on cath.  An IVUS      was done at this time and found no significant stenoses warranting      PCI.  2. Insulin-dependent diabetes.  3. Hypertension.  4. COPD on home O2.  5. Obstructive sleep apnea on BiPAP.  6. Hypertension.  7. COPD on home oxygen.  8. Obstructive sleep apnea on BiPAP.   ALLERGIES:  No known drug allergies.   MEDICATIONS ON ADMISSION:  1. Vytorin 10/40 mg q.h.s.  2. Omeprazole 20 mg twice daily.  3. Fluoxetine 40 mg daily.  4. Lisinopril 10 mg daily.  5. Metformin 1 gram twice daily.  6. Nitroglycerin 0.4 mg sublingual as needed.  7. Ativan 0.5 mg twice daily.  8. Spiriva 18 mcg daily.  9. Albuterol as needed.  10.Neurontin 300 mg three times daily.  11.Aspirin 81 mg daily.  12.Trazodone 100 mg q.h.s.   SOCIAL HISTORY:  Lives in [place].  He is currently disabled.  He is  married.  He smokes approximately two packs per day.   FAMILY HISTORY:  His father had an MI and diabetes and died in his 36's.   REVIEW OF SYSTEMS:  All 14 systems were reviewed and were negative  except as mentioned detail in the HPI.   PHYSICAL EXAM:  VITAL SIGNS:  His blood pressure is 113/79, his  respiratory rate is 18, his pulse is 74 and regular, his O2 sats 92% on  2 liters nasal cannula.  GENERAL: He is a 60 year old white male appearing chronically ill and  obese.  HEENT: Moist mucous membranes.  His pupils are equal, round, and  reactive to light and accommodation.  Anicteric sclerae.  He did have  erythema of the face.  NECK:  No jugular venous distention.  No thyromegaly.  CARDIOVASCULAR: Regular rate and rhythm.  No murmurs, rubs or gallops  with frequent PVCs and PACs on exam.  LUNGS: Scattered wheezes throughout.  ABDOMEN:  Nontender, nondistended.   Positive bowel sounds.  No masses.  EXTREMITIES: With 1-2+ pitting edema to the ankles bilaterally.  NEURO:  Alert and oriented x3.  Cranial nerves II-XII are grossly  intact.  No focal neurologic deficits.  SKIN:  Warm, dry, intact.  No rashes.  PSYCH:  Depressed mood and flat affect.   RADIOLOGY:  Chest x-ray showed no acute disease.  EKG showed normal  sinus rhythm with no ST abnormalities suggesting ischemia but with  frequent PVCs and multiple consecutive PACs and effusion complex.   LABS:  Pont of care testing shows hematocrit of 41, potassium 4.4,  creatinine 1.1.  Troponin is unremarkable.  D-dimer is 1.33.   ASSESSMENT/PLAN:  This is a 60 year old white male with coronary artery  disease here with multiple and longstanding episodes of syncope and  chest pain.  1. Chest pain. This chest pain is currently atypical and chronic.  We      will attempt to maximize medical therapy and begin beta blocker and      long-acting nitrates after his current workup.  Consider repeat      cardiac catheterization for his current symptoms.  However, PCI at      this time will unlikely be indicated, and risk benefit may be in      favor of conservative therapy.  2. Syncope.  This is concerning  for primary arrhythmia as an      etiology.  Will check an echocardiogram and TSH.  Consider an      electrophysiologic study to ascertain possible malignant      ventricular arrhythmias previously missed by outpatient monitoring.      He does state that he has had cardiac and neurologic workup.      However, his symptoms are persistant. The patient was informed that      he should not drive and may not be allowed to after      this admission. He became very agitated and apprehensive to stay.      Will continue to monitor him very closely on telemetry and look for      any other signs of arrhythmia as a source for his syncope.  Other      concerns are neurogenic syncope and may warrant another  neurology      workup.      Wendi Snipes, MD  Electronically Signed     BHH/MEDQ  D:  12/19/2008  T:  12/19/2008  Job:  [id number]

## 2011-03-20 NOTE — H&P (Signed)
NAMEHOGAN, HOOBLER             ACCOUNT NO.:  [id number]   MEDICAL RECORD NO.:  [id number]          PATIENT TYPE:  INP   LOCATION:  6715                         FACILITY:  [facility]   PHYSICIAN:  Renee Ramus, MD       DATE OF BIRTH:  1951/08/27   DATE OF ADMISSION:  01/05/2009  DATE OF DISCHARGE:                              HISTORY & PHYSICAL   PRIMARY CARE PHYSICIAN:  Cathlean Cower. Marcelle Overlie, MD   HISTORY OF PRESENT ILLNESS:  The patient is a 60 year old male with  increasing shortness of breath and dyspnea on exertion x2 days prior to  admission.  The patient also reports episodic fever and chills.  The  patient denies chest pain, PND, or orthopnea.  The patient denies  diarrhea or constipation.  The patient was recently admitted i.e.  approximately 2 weeks prior for syncope, had an extensive cardiac workup  which was negative.  The patient reports having these symptoms since  last Thanksgiving.  He has been on and off different courses of  antibiotics and has been treated as an outpatient.  However, his  symptomatology has not improved and so, now he is being admitted for  further evaluation and treatment.  The patient does report continuing to  smoke 2 packs a day, but has decreased down in the last 2-3 days prior  to admission.   PAST MEDICAL HISTORY:  1. Syncope.  2. Coronary artery disease status post drug-eluting stent placement in      the right coronary artery.  3. Diabetes mellitus type 1, uncontrolled.  4. Hypertension.  5. COPD on home O2.  6. Obstructive sleep apnea on CPAP.  7. Diabetic neuropathy.  8. Gastroesophageal reflux disease.  9. Depression.   SOCIAL HISTORY:  The patient reports smoking 2 packs per day.  Denies  alcohol use.  He is married, lives with his wife.   FAMILY HISTORY:  Not available.   REVIEW OF SYSTEMS:  All other comprehensive review of systems are  negative.   ALLERGIES:  The patient has no known drug allergies.   CURRENT  MEDICATIONS:  1. Vytorin 10/40 one p.o. nightly.  2. Lisinopril 10 mg p.o. daily.  3. Metformin 1 g p.o. b.i.d.  4. Albuterol 1-2 inhalations q.6 h. p.r.n. wheezes.  5. Neurontin 300 mg p.o. t.i.d.  6. Aspirin 81 mg p.o. daily.  7. Trazodone 100 mg p.o. nightly.  8. Prilosec 20 mg p.o. daily.  9. Prozac 40 mg p.o. q.a.m. and 20 mg p.o. q.p.m.  10.Ipratropium 1-2 inhalations q.6 h. p.r.n.  11.NPH 50 units subcu q.a.m. and 30 units subcu q.p.m.  12.Trilipix 135 mg p.o. daily.  13.Zolpidem tartrate 10 mg p.o. nightly p.r.n. insomnia.   PHYSICAL EXAMINATION:  GENERAL:  This is a well-developed, well-  nourished somewhat obese white male currently in no apparent distress.  VITAL SIGNS:  Blood pressure 123/67, heart rate 96, respiratory rate 20,  temperature 100.6.  HEENT:  No jugular venous distention or lymphadenopathy.  Oropharynx is  clear.  Mucous membranes pink and moist.  TMs clear bilaterally.  Pupils  equal and reactive to light  and accommodation.  Extraocular muscles are  intact.  CARDIOVASCULAR:  Regular rate and rhythm with no murmurs, rubs, or  gallops.  PULMONARY:  He has coarse breath sounds bilaterally, predominantly  wheezes on exhalation, but no focal areas of consolidation.  ABDOMEN:  Soft, somewhat obese, nontender, nondistended without  hepatosplenomegaly.  Bowel sounds are present.  He has no rebound or  guarding.  EXTREMITIES:  He has no clubbing, cyanosis, or edema.  He has good  peripheral pulses in dorsalis pedis and radial arteries.  He  is able to  move all extremities.  NEUROLOGIC:  Cranial nerves II-XII are grossly intact.  He has no focal  neurological deficits.   STUDIES:  1. Echocardiogram done on December 20, 2008, shows EF of 60% with no      significant wall motion abnormalities and good left ventricular      systolic function.  2. Chest x-ray shows atypical pneumonia with vascular prominence      consistent with infection and/or pulmonary  hypertension.   LABORATORY DATA:  ABG on room air shows pH 7.46, pCO2 of 34.9, and pO2  of 51.  White count 5.3, H&H 13.6 and 40, MCV 92, platelets 141.  Sodium  133, potassium 4.4, chloride 103, bicarb 22, BUN 24, creatinine 1.2,  glucose 196.   ASSESSMENT AND PLAN:  1. Atypical pneumonia.  We will treat with extended-spectrum macrolide      and third-generation cephalosporin initially and transition to p.o.      medications when the patient is stable.  2. Coronary artery disease.  Currently stable.  The patient has no      evidence of acute coronary syndrome.  3. Diabetes mellitus type 1.  We will discontinue metformin given the      amount of insulin the patient is requiring and place him on sliding      scale insulin.  Continue the NPH and monitor for adjustments.  4. Hypertension.  Currently well controlled.  5. Chronic obstructive pulmonary disease.  We will have to treat with      low-level steroids at least initially and compensate with his      insulin use for his diabetes.  We will also continue antibiotics      and neb treatments.  6. Obstructive sleep apnea.  Continue continuous positive airway      pressure at night.  Initial settings of 10/7 with respiratory      therapy to adjust for the patient's comfort.  7. Tobacco use.  The patient wishes a 21 mg nicotine patch.  8. Obesity.  We will check TSH and free T4.  9. Depression.  Continue selective serotonin reuptake inhibitor.  10.Hyperlipidemia.  Stable.  We will continue Vytorin and Trilipix.  11.Gastroesophageal reflux disease.  Continue proton pump inhibitor.  12.Disposition.  The patient is full code.   H&P was constructed by reviewing past medical history, conferring with  the emergency medical room physician, and reviewing the emergency  medical record.   TIME SPENT:  One hour.      Renee Ramus, MD  Electronically Signed     JF/MEDQ  D:  01/05/2009  T:  01/06/2009  Job:  [id number]   cc:   Wendi Snipes, MD

## 2011-03-23 NOTE — Op Note (Signed)
[name], MACCHIA             ACCOUNT NO.:  [id number]   MEDICAL RECORD NO.:  [id number]          PATIENT TYPE:  AMB   LOCATION:  ENDO                         FACILITY:  [facility]   PHYSICIAN:  Petra Kuba, M.D.    DATE OF BIRTH:  April 06, 1951   DATE OF PROCEDURE:  06/14/2005  DATE OF DISCHARGE:                                 OPERATIVE REPORT   PROCEDURE:  Colonoscopy.   INDICATIONS FOR PROCEDURE:  Patient with guaiac positivity in the past and  mid epigastric pain, due for colonic screening.  Consent was signed after  risks, benefits, methods, and options were thoroughly discussed by my  partner, Dr. Evette Cristal, in the past and with his wife multiple times by me.   MEDICATIONS USED:  Demerol 80, Versed 7.   PROCEDURE:  Rectal inspection was pertinent for small external hemorrhoids.  Digital exam was negative.  The video colonoscope was inserted and fairly  easily advanced around the colon to the cecum.  This did require some  abdominal pressure but no position changes.  No obvious abnormality was seen  on insertion.  The cecum was identified by the appendiceal orifice and the  ileocecal valve.  The scope was slowly withdrawn.  The prep was adequate,  there was some liquid stool that required washing and suctioning.  On slow  withdrawal through the colon, a few tiny probable hyperplastic appearing  polyps and some small non-worrisome polyps were seen and were hot biopsied,  these were in the transverse and in the descending which were put in the  first container, and some in the sigmoid and rectum put in the second  container.  Other than a rare left sided diverticula, no other abnormalities  were seen.  As a result, we withdrew back to the rectum.  Anorectal pull  through and retroflexion confirmed some small hemorrhoids.  The scope was  straightened and readvanced a short ways up the left side of the colon, air  was suctioned, the scope was removed.  The patient tolerated the  procedure  well.  There was no obvious complications.   ENDOSCOPIC DIAGNOSIS:  1.  Internal and external small hemorrhoids.  2.  Rare left sided diverticula.  3.  Multiple tiny to small probable hyperplastic appearing polyps hot      biopsied in the rectum, sigmoid, descending, and transverse.  4.  Otherwise, within normal limits to the cecum.   PLAN:  Await pathology to determine future colonic screening.  Continue  workup with an EGD.       MEM/MEDQ  D:  06/14/2005  T:  06/14/2005  Job:  [id number]   cc:   [facility]

## 2011-03-23 NOTE — Cardiovascular Report (Signed)
[name], [name]             ACCOUNT NO.:  [id number]   MEDICAL RECORD NO.:  [id number]          PATIENT TYPE:  OIB   LOCATION:  2899                         FACILITY:  [facility]   PHYSICIAN:  Lyn Records III, M.D.DATE OF BIRTH:  1951-07-04   DATE OF PROCEDURE:  12/21/2004  DATE OF DISCHARGE:                              CARDIAC CATHETERIZATION   INDICATIONS FOR PROCEDURE:  Abnormal Cardiolite, recent atypical chest pain  in a diabetic who smokes and has hypertension.   PROCEDURE PERFORMED:  1.  Left heart catheterization.  2.  Selective coronary angiogram.  3.  Left ventriculography.  4.  TAXUS stent implantation.  5.  AngioSeal arteriotomy closure.   DESCRIPTION:  After informed consent 6-French sheath was placed in the right  femoral artery using the modified Seldinger technique. A 6-French A2  multipurpose catheter was used for hemodynamic recordings, left  ventriculography by hand injection, and selective left coronary angiography.  A #4 6-French right Judkins catheter was used for right coronary  angiography. The patient tolerated the diagnostic procedure without  complications.   There was a significant distal right coronary stenosis noted. This would  correlate with the Cardiolite findings of inferior ischemia and is possibly  the cause of the patient's symptoms. After discussing percutaneous  intervention we proceeded to stent the right coronary using a 3.5 x 20 mm  long TAXUS stent inflated to a peak pressure of 14 atmospheres. Two balloon  inflations were performed.   The angioplasty procedure and stent was accomplished using a 0.014 BMW wire,  a 2.5 x 15 mm Maverick balloon with which we pre dilated x1 balloon  inflation, and a 6-French hockey stick guide catheter. The patient was given  a total of 600 mg of Plavix. He was given four chewable aspirin tablets. He  was given a double bolus followed by an infusion of Integrilin. He was given  weight-based  heparin. ACT was documented to be greater than 230 seconds  before wire insertion.   Post procedure AngioSeal arteriotomy closure was performed with good  hemostasis.   RESULTS:   I. HEMODYNAMIC DATA:  a. Aortic pressure 115/80.  b. Left ventricular pressure 118/13.   II. LEFT VENTRICULOGRAPHY:  The left ventricle was faintly opacified because  of high cardiac output. LV contractility is felt to be normal with an EF at  least 50%. No mitral regurgitation noted.   III. CORONARY ANGIOGRAPHY:  a. Left main coronary: Widely patent.  b. Left anterior descending coronary: The LAD after the septal perforator  first diagonal contains a somewhat segmental hazy 50-60% stenosis. In one  RAO view this stenosis may be as much as 70%. The distal vessel reaches the  left ventricular apex. No significant obstructive lesions are noted in the  branches of the LAD or in the proximal distal vessel.  c. Circumflex artery: The circumflex artery is large. There is proximal  eccentric 50% narrowing just after the first obtuse marginal. The first  marginal and the second marginals are large. There is 50% mid narrowing  between the first and second obtuse marginal branches.  d. Right coronary: The  right coronary contains a 7-80% distal stenosis  beyond proximal and mid tortuosity. The PDA and LV branch are moderate in  size.   IV. PERCUTANEOUS CORONARY INTERVENTION:  The 80% distal stenosis was reduced  to less than 5% following angioplasty and stenting with a TAXUS 3.5 x 20 mm  long drug-eluting stent. TIMI grade 3 flow was noted post procedure.   V:  AngioSeal arteriotomy closure was performed successfully.   CONCLUSIONS:  1.  Coronary atherosclerotic heart disease with 70-80% distal right coronary      artery stenosis with a distal vascular distribution that would explain      the ischemia noted on the patient's Cardiolite. The patient has 50-70%      narrowing in the mid left anterior descending and  50% proximal      circumflex narrowing. There is no additional area of ischemia to suggest      significant flow reduction in either left anterior descending or      circumflex based on the Cardiolite  2.  Normal left ventricular function.  3.  Successful right coronary artery stent with reduction in stenosis from      80% to less than 5% with a TAXUS drug-eluting stent.   PLAN:  1.  Integrilin x18 hours  2.  Aspirin and Plavix.  3.  Discharge December 22, 2004.      HWS/MEDQ  D:  12/21/2004  T:  12/21/2004  Job:  [id number]   cc:   Meade Maw, M.D.  [address]  [place]  [place] [address]  Fax: [phone]

## 2011-03-23 NOTE — H&P (Signed)
NAMEOBE, [name]             ACCOUNT NO.:  [id number]   MEDICAL RECORD NO.:  [id number]          PATIENT TYPE:  INP   LOCATION:  5714                         FACILITY:  [facility]   PHYSICIAN:  Gertha Calkin, M.D.DATE OF BIRTH:  02/14/1951   DATE OF ADMISSION:  09/05/2004  DATE OF DISCHARGE:                                HISTORY & PHYSICAL   PRIMARY CARE PHYSICIAN:  [facility]   CHIEF COMPLAINT:  Chronic alcohol intoxication, requesting inpatient  rehabilitation.   HISTORY OF PRESENT ILLNESS:  This is a 60 year old, pleasant white male who  presents earlier today to request Behavior Health admission for inpatient  rehab from alcohol abuse.  We were asked to admit the patient for medical  observation given his long history of withdrawal seizures from past  experiences when he stopped drinking.  The patient states that his most  recent withdrawal seizure occurred approximately a year ago but has since  stopped intermittently drinking and the seizures do not always correlate  with his alcohol cessation.  The patient denies any other issues at this  time.   CURRENT MEDICATIONS:  1.  Insulin 5 units q.h.s.  2.  Glyburide 5 mg p.o. b.i.d.  3.  Prozac 10 mg p.o. t.i.d.  4.  Toprol XL 25 mg p.o. q.a.m.  5.  Actos 30 mg p.o. q.a.m.   He states he is taking all these medications as prescribed by his  physicians.   ALLERGIES:  None.   PAST MEDICAL HISTORY:  1.  Depression.  2.  Hypertension.  3.  Insulin-dependent diabetes.  4.  Recently diagnosed obstructive sleep apnea from the pulmonary lab (he      does not know the results yet).   SURGERIES:  Right shoulder surgery done in 1985; carpal tunnel release x2  bilaterally, one in 1985 and one this year.  He has also had an ulnar nerve  entrapment that was transposed earlier this year.   SOCIAL HISTORY:  He lives in [place], is married but is not on speaking  terms with his wife. He is currently  unemployed.  Has 2 daughters ages 56  and 40, and a son age 2. None of his children have any medical histories to  his knowledge.  He has 3 brothers, one 64, 40, and the other 8, who are  healthy.  He has a history of approximately 18 years of greater than a pack  per day tobacco use, as high as 3 packs per day.  Currently he smokes about  5 or 6 cigarettes a day.  He has also abused alcohol for 36 years and when  he drinks heavily he can consume about 1 to 2 fifths of bourbon in a day.  He denies any other illicit drug use.   FAMILY HISTORY:  Positive for myocardial infarction and diabetes in his  father's side. He passed away at age 68.  His mother also passed away at age  37 from lung cancer/emphysema.   REVIEW OF SYSTEMS:  Negative for hematemesis, hemoptysis, has never had any  issues with melena or bloody stools.  Has  never had any DUI's, states that  he has never had problems with work but has gone to work intoxicated.  He  states that he has quit drinking on several occasions but due to  uncontrolled depression he feels he has fallen off the wagon several times  in the past.  Otherwise review of systems is positive for night sweats, no  weight loss, vision changes, difficulty swallowing, no problems with bowel  habits, urination, and has never had any history of kidney stones or chest  pain, palpitations. Denies further review of systems questions.   PHYSICAL EXAMINATION:  VITAL SIGNS:  Temperature is 97.1, pulse of 92,  respirations 20, blood pressure 102/70.  GENERAL:  This is an obese, pleasant white male lying in bed in no acute  distress.  HEENT:  Unremarkable.  NECK:  Without jugular venous distension, no mass is noted.  CHEST:  Shows prolonged expiration phase but not active wheezing, rhonchi or  rales.  CARDIAC:  Regular rate and rhythm, no murmurs, rubs, or gallops.  ABDOMEN:  Soft, nontender, nondistended.  Obese with positive bowel sounds.  No rebound or guarding  noted.  EXTREMITIES:  No clubbing, cyanosis or edema.  SKIN:  Without lesions or rashes.   LABORATORY DATA:  Alcohol level is 101.  Urine drug screen was negative.  An  I-STAT shows a sodium of 138, potassium 3.8, chloride of 105, bicarb of 19,  glucose of 263, a BUN of 16.  The I-STAT showed a hemoglobin of 13,9,  hematocrit of 41.   IMPRESSION:  1.  Chronic alcohol use, requesting admission and again needing medical      observation.  2.  Hypertension.  3.  Insulin-dependent diabetes.  4.  Depression.   PLAN:  Will admit for medical observation, to await transfer to Behavior  Health tomorrow or the day after.  I am admitting him as a full admit since  his last drink was just yesterday evening.  Will give him thiamin,  multivitamins and folate daily p.o. as well as resuming his medications  which include his diabetes medicines, his blood pressure pills and his  medication for depression.  Will also put him on a sliding scale Insulin and  will check his CBGs q. Shift.  I have asked nursing to watch out for  withdrawal symptoms and have placed an IV saline lock.  Drawing some labs  which include CMET, CBC with differential, coags, a hemoglobin A1C level.       JD/MEDQ  D:  09/05/2004  T:  09/05/2004  Job:  [id number]   cc:   [facility]

## 2011-03-23 NOTE — Op Note (Signed)
NAMEANTIONIO, NEGRON             ACCOUNT NO.:  [id number]   MEDICAL RECORD NO.:  [id number]          PATIENT TYPE:  AMB   LOCATION:  ENDO                         FACILITY:  [facility]   PHYSICIAN:  Petra Kuba, M.D.    DATE OF BIRTH:  07-21-1951   DATE OF PROCEDURE:  06/14/2005  DATE OF DISCHARGE:                                 OPERATIVE REPORT   PROCEDURE:  Esophagogastroduodenoscopy.   INDICATIONS FOR PROCEDURE:  Upper tract symptoms, abdominal pain, history of  GI bleed, nondiagnostic colonoscopy.  Consent was signed prior to premeds  given after risks, benefits, methods, and options were thoroughly discussed  in the office by my partner, Dr. Wandalee Ferdinand.   ADDITIONAL MEDICATIONS USED:  Demerol 20 mg, Versed 1 mg.   PROCEDURE:  The video endoscope was inserted by direct vision.  The  esophagus was normal.  He did have a tiny hiatal hernia.  The scope passed  into the stomach and advanced to the antrum pertinent for a minimal amount  of antritis, into the duodenal bulb pertinent for a minimal amount of  bulbitis, and around the C-loop to a normal second portion of the duodenum.  The scope was withdrawn back to the bulb and a good look there ruled out  ulcers in that location.  The scope was withdrawn back to the stomach and  retroflexed.  The angularis, cardia, fundus, lesser and greater curve were  normal on retroflex visualization.  Straight visualization of the stomach  did not reveal any additional findings.  The air was suctioned, the scope  was slowly withdrawn.  Again, a good look at the esophagus was normal.  The  scope was removed.  The patient tolerated the procedure well.  There was no  obvious complications.   ENDOSCOPIC DIAGNOSIS:  1.  Tiny hiatal hernia.  2.  Minimal antritis and bulbitis.  3.  Otherwise, normal EGD.   PLAN:  Continue pump inhibitors, follow p.r.n. or in two months to recheck  symptoms, guaiacs, probably CBC, and make sure no further workup plans  are  needed, if his abdominal pain continues, abdominal ultrasound to rule out  gallstones would probably be my next step.       MEM/MEDQ  D:  06/14/2005  T:  06/14/2005  Job:  [id number]   cc:   [facility]

## 2011-03-23 NOTE — Discharge Summary (Signed)
[name], [name]             ACCOUNT NO.:  [id number]   MEDICAL RECORD NO.:  [id number]          PATIENT TYPE:  INP   LOCATION:  5714                         FACILITY:  [facility]   PHYSICIAN:  Lonia Blood, M.D.      DATE OF BIRTH:  Jul 14, 1951   DATE OF ADMISSION:  09/05/2004  DATE OF DISCHARGE:  09/07/2004                                 DISCHARGE SUMMARY   PRIMARY CARE PHYSICIAN:  Bing Ree, [facility].   DISCHARGE DIAGNOSES:  1.  Alcohol detoxification status post medical detox.  2.  Depression.  3.  Diabetes, type 2, insulin requiring.  4.  Hypertension.   DISCHARGE MEDICATIONS:  1.  Diabetes 5 mg daily.  2.  Actos 30 mg daily.  3.  Thiamine 100 mg daily.  4.  Folic acid 1 mg daily.  5.  Lantus insulin 5 units q.h.s. subcutaneously.  6.  Ativan 2 mg b.i.d. p.r.n. three more days.  7.  Multivitamin one daily.  8.  Prozac 10 mg as previously prescribed _________ t.i.d.  9.  Toprol XL 25 mg daily.   DISPOSITION:  The patient is to follow up with his primary care physician at  [facility] as previously scheduled.  He was also given a number  to follow up with Alcoholics Anonymous programs in the area to help maintain  his detoxification.   PROCEDURES:  None.   CONSULTATIONS:  [facility] __________.   BRIEF HISTORY:  Please refer to the dictated History and Physical by Dr.  Gertha Calkin.  In short, this is a 60 year old white male who was  admitted from the ER secondary to patient request for detoxification.  The  patient was initially assessed by [facility] who was supposed  to have admitted the patient; however, they declined, and the patient was  seriously requesting detoxification.  He apparently has had problems with  withdrawal seizures in the past when he tried to do his detoxification on  his own.  The patient was an intermittent drinker, but had intersocial  problems that pushed him towards getting  intoxicated on the day of  admission.  His alcohol level then was found to be 101.  His urine drug  screen showed that he had no other substance in that.  He is also a diabetic  and came in with some hyperglycemia.   HOSPITAL COURSE:  #1 - ALCOHOL DETOXIFICATION.  The patient was placed on  telemetry, monitored closely for signs of DT, started on thiamine, folate,  multivitamin and continued to build this up.  On the second day of  admission, the patient was closely observed for signs of DT.  There was no  frank signs of DT, although, the patient was a little bit tremulous, but no  full blown DT.  DT protocol was activated at that point initially.  However,  the patient did not get completed.  I am, therefore, discharging him home  with some limited number of Antivert p.r.n. just in case he has some mild  agitation.   #2 - DIABETES.  The patient has well established  diabetes and has his home  regimen of medication that was maintained during this hospitalization.  His  CBG's were within acceptable range.   #3 - DEPRESSION.  The patient was taking Prozac prior to this admission.  He  was maintained on his Prozac also throughout.   #4 - HYPERTENSION.  His blood pressure was well controlled.  He was on  medicine of Toprol XL, and that was maintained.   #5 - TOBACCO ABUSE.  The patient has history of tobacco abuse.  However, he  has been trying to cut back on his own.  He was fully counseled during this  hospitalization.  Marland Kitchen       LG/MEDQ  D:  09/07/2004  T:  09/07/2004  Job:  [id number]   cc:   __________ [facility]   Gertha Calkin, M.D.  Fax: [phone]

## 2011-03-23 NOTE — Op Note (Signed)
NAME:  [name], [name]                       ACCOUNT NO.:  [id number]   MEDICAL RECORD NO.:  [id number]                   PATIENT TYPE:  AMB   LOCATION:  NESC                                 [facility]ACILITY:  [facility]   PHYSICIAN:  Feliberto Gottron. Turner Daniels, M.D.                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   POSTOPERATIVE DIAGNOSES:  1. 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  2. Recurrent left carpal tunnel syndrome.   PROCEDURE:  Left Guyon canal release and re-release of carpal tunnel.   SURGEON:  Feliberto Gottron. Turner Daniels, M.D.   FIRST ASSISTANT:  None.   ANESTHESIA:  IV regional.   ESTIMATED BLOOD LOSS:  Minimal.   FLUIDS REPLACED:  100 mL of crystalloid.   TOURNIQUET TIME:  32 minutes.   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  He  was treated by Dr. Stasia Cavalier in [facility] whose gone on sabbatical and  he presented to my office a couple of weeks ago for consideration of Guyon  canal release. The risks and benefits of surgery well understood by the  patient.   DESCRIPTION OF PROCEDURE:  The patient identified by arm band, taken to the  operating room at [facility], appropriate  __________ monitors were attached and IV regional anesthesia induced into  the left upper extremity which was then prepped and draped in the usual  sterile fashion from the fingertips to the tourniquet. We began the  procedure by repeating the carpal tunnel incision starting at the wrist  flexion crease and going just to the ulnar side of the thenar crease over a  distance of 4 cm distally. We were able to identify and enter  the carpal  tunnel and track this up to the superficial arch. Going proximally a large  amount of fibrous tissue was found on top of the median nerve and the carpal  tunnel and this was released as well because it was felt to be tight around  the median nerve.  Following the superficial arch back toward Guyon canal,  we carefully released the soft tissue all the way back to the wrist  completing the Guyon canal release and positively identifying the ulnar  nerve and artery.  Satisfied with release, the wound was then washed out  with normal saline solution and  the skin only was closed with running 5-0 nylon suture. A dressing of  Xeroform, 4 x 4 dress, sponges, Webril and a 2 inch Ace wrap applied. The  tourniquet was let down, the patient was awakened  and taken to the recovery  room without difficulty.                                               Feliberto Gottron. Turner Daniels, M.D.    Ovid Curd  D:  05/30/2004  T:  05/30/2004  Job:  [id number]

## 2011-03-23 NOTE — Procedures (Signed)
NAME:  [name], [name]             ACCOUNT NO.:  [id number]   MEDICAL RECORD NO.:  [id number]          PATIENT TYPE:  OUT   LOCATION:  SLEEP CENTER                 FACILITY:  [facility]   PHYSICIAN:  Clinton D. Maple Hudson, M.D. DATE OF BIRTH:  1951-09-26   DATE OF STUDY:  08/21/2004                              NOCTURNAL POLYSOMNOGRAM   STUDY DATE:  08/21/04   REFERRING PHYSICIAN:  Clydie Braun L. Hal Hope, M.D.   INDICATION FOR STUDY:  Hypersomnia with sleep apnea.  Epworth Sleepiness  score 11/24, BMI 36, weight 260 pounds.   SLEEP ARCHITECTURE:  Total sleep time 351 minutes with sleep efficiency of  80%.  Stage I was 15%, stage II 70% and stages III and IV were absent.  REM  was 16% of total sleep time.  Sleep latency was 21%.  REM latency was 362  minutes.  Awake after sleep onset 67 minutes.  Arousal index 82.  Most  arousals were related to respiratory events.   RESPIRATORY DATA:  Split study protocol.  RDI 120.9 per hour indicating very  severe obstructive sleep apnea/hypopnea syndrome before CPAP.  This included  152 obstructive apnea's and 127 hypopnea's before CPAP.  Events were not  positional.  REM RDI 11 per hour.  CPAP was titrated to 22 CWP, RDI 0 per  hour using a medium full face Res Med UltraMirage mask with heated  humidifier.   OXYGEN DATA:  Moderate to loud snoring with oxygen desaturation to a nadir  of 82% before CPAP.  After CPAP control, saturation held to 94% to 95% on  room air.   CARDIAC DATA:  Sinus rhythm with occasional PVC.   MOVEMENTS/PARASOMNIA:  Bathroom x2.  A total of 266 limb jerks were recorded  of which 34 were associated with arousal or awakening with an arousal index  of 6.7 per hour.  This may indicate a periodic limb movement syndrome but  will easier to evaluate clinically after sleep apnea has been treated.   IMPRESSION/RECOMMENDATION:  Severe obstructive sleep apnea/hypopnea  syndrome, RDI 120.9 per hour with desaturation to 82%.  Successful  CPAP  titration to 22 CWP, RDI 0 per hour using a medium Res Med UltraMirage full  face mask with heat humidifier.  Frequent leg jerks which may partly relate  to sleep apnea arousals, but may also reflect periodic limb movement  syndrome and can be reassessed clinically after sleep apnea is treated.                                                          Clinton D. Maple Hudson, M.D.  Diplomate, American Board  CDY/MEDQ  D:  08/27/2004 09:13:38  T:  08/27/2004 19:52:51  Job:  [id number]

## 2011-03-23 NOTE — Discharge Summary (Signed)
NAMETRAMPUS, MCQUERRY             ACCOUNT NO.:  [id number]   MEDICAL RECORD NO.:  [id number]          PATIENT TYPE:  INP   LOCATION:  3734                         FACILITY:  [facility]   PHYSICIAN:  Lyn Records, M.D.   DATE OF BIRTH:  11-26-1950   DATE OF ADMISSION:  04/11/2007  DATE OF DISCHARGE:  04/11/2007                               DISCHARGE SUMMARY   DISCHARGE DIAGNOSES:  1. Coronary artery disease, felt to be noncardiac in nature.  2. Known coronary artery disease.  3. Normal left ventricular function.  4. Diabetes mellitus, oral agent.  5. Hypertension, treated.  6. Obstructive sleep apnea, on BiPAP at home.  7. Chronic obstructive pulmonary disease.  8. Long-term medication use.   HISTORY:  Mr. Matuszak is a 60 year old patient who was admitted to [facility] on April 10, 2007 with recurrent angina.  He has a history of  coronary artery disease, and a underwent a Taxus stent placement to the  right coronary artery around February of this year.   LABORATORY DATA:  During this hospitalization included a white count of  12.8, hemoglobin 12.1, hematocrit 35.9, platelets 262.  Sodium 140,  potassium 4.1, BUN 24, creatinine 1.23.  LFTs normal.  Cardiac  isoenzymes negative x2.  EKG normal sinus rhythm, rate 94 with no acute  ST-T wave changes.   HOSPITAL COURSE:  Ultimately, the patient required cardiac  catheterization and was found to have normal LV function.  No renal  artery stenosis.  His previously placed RCA stent was patent.  There was  no significantly obstructive coronary disease otherwise noted.   The patient then discharged to home in stable but improved condition.   DISCHARGE INSTRUCTIONS:  Include:  A low-sodium, heart healthy diabetic  diet.  Clean cath site gently with soap and water, no scrubbing.  Increase activity slowly.  No driving for 2 days.  No lifting over 10  pounds for 1 week.  Follow up with Dr. Eldridge Dace on April 23, 2007, at  11:00 a.m.   MEDICATIONS:  1. Baby aspirin 81 mg a day.  2. Zocor one tablet daily, as prior to admission.  3. Metoprolol as prior to admission.  4. Continue BiPAP as instructed at home.  5. The patient is to restart his metformin on April 14, 2007.   CONDITION ON DISCHARGE:  Again, this patient is discharged to home in  stable and improved condition.  Call for any further questions or  concerns.      Guy Franco, P.A.      Lyn Records, M.D.  Electronically Signed    LB/MEDQ  D:  05/21/2007  T:  05/22/2007  Job:  [id number]   cc:   Corky Crafts, MD  Lyn Records, M.D.

## 2011-05-08 ENCOUNTER — Encounter: Payer: Self-pay | Admitting: Family Medicine

## 2011-05-08 DIAGNOSIS — T7840XA Allergy, unspecified, initial encounter: Secondary | ICD-10-CM | POA: Insufficient documentation

## 2011-05-08 DIAGNOSIS — F32A Depression, unspecified: Secondary | ICD-10-CM | POA: Insufficient documentation

## 2011-05-08 DIAGNOSIS — E119 Type 2 diabetes mellitus without complications: Secondary | ICD-10-CM | POA: Insufficient documentation

## 2011-05-08 DIAGNOSIS — D649 Anemia, unspecified: Secondary | ICD-10-CM | POA: Insufficient documentation

## 2011-05-08 DIAGNOSIS — G4730 Sleep apnea, unspecified: Secondary | ICD-10-CM | POA: Insufficient documentation

## 2011-05-08 DIAGNOSIS — K219 Gastro-esophageal reflux disease without esophagitis: Secondary | ICD-10-CM | POA: Insufficient documentation

## 2011-05-08 DIAGNOSIS — F329 Major depressive disorder, single episode, unspecified: Secondary | ICD-10-CM | POA: Insufficient documentation

## 2011-05-08 DIAGNOSIS — I10 Essential (primary) hypertension: Secondary | ICD-10-CM | POA: Insufficient documentation

## 2011-07-25 LAB — DIFFERENTIAL
Basophils Absolute: 0.1
Eosinophils Relative: 2
Lymphocytes Relative: 15
Monocytes Absolute: 0.9
Monocytes Relative: 7

## 2011-07-25 LAB — CBC
HCT: 37.2 — ABNORMAL LOW
HCT: 39.3
MCHC: 34.8
MCHC: 35.1
MCV: 87.1
MCV: 87.3
Platelets: 261
Platelets: 286
RDW: 13.4
RDW: 13.5

## 2011-07-25 LAB — POCT CARDIAC MARKERS: Myoglobin, poc: 151

## 2011-07-25 LAB — CARDIAC PANEL(CRET KIN+CKTOT+MB+TROPI)
Total CK: 101
Troponin I: 0.01

## 2011-07-25 LAB — POCT I-STAT CREATININE: Operator id: 146091

## 2011-07-25 LAB — I-STAT 8, (EC8 V) (CONVERTED LAB)
Acid-base deficit: 1
Chloride: 105
HCT: 43
Hemoglobin: 14.6
Potassium: 4.5
Sodium: 135
TCO2: 26
pH, Ven: 7.378 — ABNORMAL HIGH

## 2011-07-25 LAB — HEMOGLOBIN A1C: Hgb A1c MFr Bld: 8.3 — ABNORMAL HIGH

## 2011-07-25 LAB — LIPID PANEL
LDL Cholesterol: 113 — ABNORMAL HIGH
Total CHOL/HDL Ratio: 8.5
VLDL: 51 — ABNORMAL HIGH

## 2011-07-25 LAB — CK TOTAL AND CKMB (NOT AT ARMC)
CK, MB: 1.9
Relative Index: 1.8
Total CK: 108

## 2011-07-25 LAB — TROPONIN I: Troponin I: 0.01

## 2011-07-25 LAB — B-NATRIURETIC PEPTIDE (CONVERTED LAB): Pro B Natriuretic peptide (BNP): <30

## 2011-08-03 LAB — POCT CARDIAC MARKERS
CKMB, poc: 2
CKMB, poc: 3.7
Myoglobin, poc: 152
Myoglobin, poc: 215
Troponin i, poc: <0.05
Troponin i, poc: <0.05

## 2011-08-03 LAB — CBC
HCT: 37.8 — ABNORMAL LOW
MCV: 88.8
Platelets: 269
RBC: 4.25
WBC: 12.9 — ABNORMAL HIGH

## 2011-08-03 LAB — COMPREHENSIVE METABOLIC PANEL
Alkaline Phosphatase: 46
BUN: 15
CO2: 26
Chloride: 106
Creatinine, Ser: 1.07
GFR calc non Af Amer: >60
Glucose, Bld: 82
Potassium: 3.7
Total Bilirubin: 0.4

## 2011-08-03 LAB — BASIC METABOLIC PANEL
BUN: 17
Chloride: 106
Creatinine, Ser: 1.07
GFR calc Af Amer: >60
GFR calc non Af Amer: >60
Potassium: 4.6

## 2011-08-03 LAB — DIFFERENTIAL
Eosinophils Absolute: 0.2
Lymphocytes Relative: 11 — ABNORMAL LOW
Lymphs Abs: 1.5
Monocytes Relative: 6
Neutrophils Relative %: 81 — ABNORMAL HIGH

## 2011-08-03 LAB — MAGNESIUM: Magnesium: 2

## 2011-08-03 LAB — TSH: TSH: 2.01

## 2011-08-03 LAB — GLUCOSE, CAPILLARY
Glucose-Capillary: 147 — ABNORMAL HIGH
Glucose-Capillary: 184 — ABNORMAL HIGH

## 2011-08-03 LAB — PROTIME-INR
INR: 1
Prothrombin Time: 13.5

## 2011-08-20 LAB — DIFFERENTIAL
Basophils Relative: 0
Eosinophils Absolute: 0.4
Monocytes Absolute: 0.9 — ABNORMAL HIGH
Monocytes Relative: 6
Neutrophils Relative %: 75

## 2011-08-20 LAB — URINALYSIS, ROUTINE W REFLEX MICROSCOPIC
Glucose, UA: >1000 — AB
Hgb urine dipstick: NEGATIVE
Leukocytes, UA: NEGATIVE
Protein, ur: NEGATIVE
Specific Gravity, Urine: 1.031 — ABNORMAL HIGH
pH: 5.5

## 2011-08-20 LAB — CBC
Hemoglobin: 14.5
RBC: 4.78
RDW: 12.8
WBC: 15.3 — ABNORMAL HIGH

## 2011-08-20 LAB — COMPREHENSIVE METABOLIC PANEL
ALT: 16
Albumin: 3.9
Alkaline Phosphatase: 84
Chloride: 101
Glucose, Bld: 287 — ABNORMAL HIGH
Potassium: 4.5
Sodium: 135
Total Bilirubin: 0.5
Total Protein: 7.1

## 2011-08-20 LAB — URINE MICROSCOPIC-ADD ON

## 2011-08-23 LAB — POCT CARDIAC MARKERS
CKMB, poc: 1.5
CKMB, poc: 1.7
Myoglobin, poc: 180
Operator id: 277751
Troponin i, poc: <0.05

## 2011-08-23 LAB — I-STAT 8, (EC8 V) (CONVERTED LAB)
Acid-base deficit: 6 — ABNORMAL HIGH
Chloride: 112
pCO2, Ven: 31.5 — ABNORMAL LOW
pH, Ven: 7.362 — ABNORMAL HIGH

## 2011-08-23 LAB — CBC
HCT: 35.9 — ABNORMAL LOW
Hemoglobin: 12.1 — ABNORMAL LOW
MCHC: 33.6
MCHC: 33.9
MCV: 89.4
MCV: 89.9
Platelets: 299
RBC: 3.99 — ABNORMAL LOW

## 2011-08-23 LAB — DIFFERENTIAL
Eosinophils Relative: 1
Lymphocytes Relative: 8 — ABNORMAL LOW
Lymphs Abs: 1.6
Monocytes Absolute: 0.9 — ABNORMAL HIGH
Monocytes Relative: 5

## 2011-08-23 LAB — CARDIAC PANEL(CRET KIN+CKTOT+MB+TROPI)
Relative Index: 2
Total CK: 178

## 2011-08-23 LAB — COMPREHENSIVE METABOLIC PANEL
AST: 16
Albumin: 3.8
Calcium: 9.1
Creatinine, Ser: 1.23
GFR calc Af Amer: >60
Total Protein: 6.6

## 2011-08-23 LAB — APTT: aPTT: 33

## 2011-08-23 LAB — MAGNESIUM: Magnesium: 2

## 2011-08-23 LAB — POCT I-STAT CREATININE: Operator id: 277751

## 2011-09-19 ENCOUNTER — Other Ambulatory Visit: Payer: Self-pay | Admitting: Gastroenterology

## 2011-09-19 LAB — HM COLONOSCOPY

## 2011-09-20 ENCOUNTER — Emergency Department (HOSPITAL_COMMUNITY): Payer: Managed Care, Other (non HMO)

## 2011-09-20 ENCOUNTER — Encounter (HOSPITAL_COMMUNITY): Payer: Self-pay | Admitting: Emergency Medicine

## 2011-09-20 ENCOUNTER — Inpatient Hospital Stay (HOSPITAL_COMMUNITY)
Admission: EM | Admit: 2011-09-20 | Discharge: 2011-09-22 | DRG: 189 | Disposition: A | Discharge status: Home / Self Care | Payer: Managed Care, Other (non HMO) | Source: Ambulatory Visit | Attending: Internal Medicine | Admitting: Internal Medicine

## 2011-09-20 ENCOUNTER — Other Ambulatory Visit: Payer: Self-pay

## 2011-09-20 DIAGNOSIS — J189 Pneumonia, unspecified organism: Secondary | ICD-10-CM

## 2011-09-20 DIAGNOSIS — J9601 Acute respiratory failure with hypoxia: Secondary | ICD-10-CM | POA: Diagnosis present

## 2011-09-20 DIAGNOSIS — G4730 Sleep apnea, unspecified: Secondary | ICD-10-CM | POA: Diagnosis present

## 2011-09-20 DIAGNOSIS — E860 Dehydration: Secondary | ICD-10-CM | POA: Diagnosis present

## 2011-09-20 DIAGNOSIS — I959 Hypotension, unspecified: Secondary | ICD-10-CM | POA: Diagnosis present

## 2011-09-20 DIAGNOSIS — F17200 Nicotine dependence, unspecified, uncomplicated: Secondary | ICD-10-CM | POA: Diagnosis present

## 2011-09-20 DIAGNOSIS — E876 Hypokalemia: Secondary | ICD-10-CM | POA: Diagnosis present

## 2011-09-20 DIAGNOSIS — J449 Chronic obstructive pulmonary disease, unspecified: Secondary | ICD-10-CM

## 2011-09-20 DIAGNOSIS — J441 Chronic obstructive pulmonary disease with (acute) exacerbation: Secondary | ICD-10-CM | POA: Diagnosis present

## 2011-09-20 DIAGNOSIS — K219 Gastro-esophageal reflux disease without esophagitis: Secondary | ICD-10-CM | POA: Diagnosis present

## 2011-09-20 DIAGNOSIS — R0902 Hypoxemia: Secondary | ICD-10-CM

## 2011-09-20 DIAGNOSIS — E119 Type 2 diabetes mellitus without complications: Secondary | ICD-10-CM | POA: Diagnosis present

## 2011-09-20 DIAGNOSIS — I10 Essential (primary) hypertension: Secondary | ICD-10-CM | POA: Diagnosis present

## 2011-09-20 DIAGNOSIS — N179 Acute kidney failure, unspecified: Secondary | ICD-10-CM | POA: Diagnosis present

## 2011-09-20 DIAGNOSIS — Z79899 Other long term (current) drug therapy: Secondary | ICD-10-CM

## 2011-09-20 DIAGNOSIS — J9600 Acute respiratory failure, unspecified whether with hypoxia or hypercapnia: Principal | ICD-10-CM | POA: Diagnosis present

## 2011-09-20 DIAGNOSIS — R000 Tachycardia, unspecified: Secondary | ICD-10-CM | POA: Diagnosis present

## 2011-09-20 HISTORY — DX: Disorder of parathyroid gland, unspecified: E21.5

## 2011-09-20 HISTORY — DX: Shortness of breath: R06.02

## 2011-09-20 HISTORY — DX: Pneumonia, unspecified organism: J18.9

## 2011-09-20 HISTORY — DX: Atherosclerotic heart disease of native coronary artery without angina pectoris: I25.10

## 2011-09-20 HISTORY — DX: Disorder of thyroid, unspecified: E07.9

## 2011-09-20 LAB — CBC
HCT: 33.2 % — ABNORMAL LOW (ref 39.0–52.0)
Hemoglobin: 11.2 g/dL — ABNORMAL LOW (ref 13.0–17.0)
MCHC: 34.4 g/dL (ref 30.0–36.0)
MCHC: 33.7 g/dL (ref 30.0–36.0)
RDW: 13.8 % (ref 11.5–15.5)
RDW: 13.9 % (ref 11.5–15.5)
WBC: 9.3 10*3/uL (ref 4.0–10.5)

## 2011-09-20 LAB — CARDIAC PANEL(CRET KIN+CKTOT+MB+TROPI)
CK, MB: 7.3 ng/mL (ref 0.3–4.0)
CK, MB: 7.8 ng/mL (ref 0.3–4.0)
Troponin I: <0.3 ng/mL (ref <0.30)
Troponin I: <0.3 ng/mL (ref <0.30)

## 2011-09-20 LAB — GLUCOSE, RANDOM: Glucose, Bld: 451 mg/dL — ABNORMAL HIGH (ref 70–99)

## 2011-09-20 LAB — POCT I-STAT TROPONIN I: Troponin i, poc: 0.02 ng/mL (ref 0.00–0.08)

## 2011-09-20 LAB — BASIC METABOLIC PANEL
BUN: 35 mg/dL — ABNORMAL HIGH (ref 6–23)
Calcium: 7.5 mg/dL — ABNORMAL LOW (ref 8.4–10.5)
Creatinine, Ser: 1.56 mg/dL — ABNORMAL HIGH (ref 0.50–1.35)
GFR calc Af Amer: 54 mL/min — ABNORMAL LOW (ref >90)
GFR calc non Af Amer: 46 mL/min — ABNORMAL LOW (ref >90)

## 2011-09-20 LAB — CREATININE, SERUM
Creatinine, Ser: 1.69 mg/dL — ABNORMAL HIGH (ref 0.50–1.35)
GFR calc Af Amer: 49 mL/min — ABNORMAL LOW (ref >90)
GFR calc non Af Amer: 42 mL/min — ABNORMAL LOW (ref >90)

## 2011-09-20 MED ORDER — IPRATROPIUM BROMIDE 0.02 % IN SOLN
0.5000 mg | Freq: Four times a day (QID) | RESPIRATORY_TRACT | Status: DC
  Administered 2011-09-20 – 2011-09-22 (×7): 0.5 mg via RESPIRATORY_TRACT
  Filled 2011-09-20 (×7): qty 2.5

## 2011-09-20 MED ORDER — IPRATROPIUM BROMIDE 0.02 % IN SOLN
0.5000 mg | Freq: Once | RESPIRATORY_TRACT | Status: AC
  Administered 2011-09-20: 0.5 mg via RESPIRATORY_TRACT
  Filled 2011-09-20: qty 2.5

## 2011-09-20 MED ORDER — EZETIMIBE-SIMVASTATIN 10-80 MG PO TABS
1.0000 | ORAL_TABLET | Freq: Every day | ORAL | Status: DC
  Administered 2011-09-20 – 2011-09-21 (×2): 1 via ORAL
  Filled 2011-09-20 (×3): qty 1

## 2011-09-20 MED ORDER — ACETAMINOPHEN 325 MG PO TABS: 650.0000 mg | ORAL_TABLET | Freq: Four times a day (QID) | ORAL | Status: DC | PRN

## 2011-09-20 MED ORDER — INSULIN ASPART 100 UNIT/ML ~~LOC~~ SOLN
0.0000 [IU] | Freq: Every day | SUBCUTANEOUS | Status: DC
  Administered 2011-09-20: 5 [IU] via SUBCUTANEOUS
  Filled 2011-09-20: qty 3

## 2011-09-20 MED ORDER — GUAIFENESIN ER 600 MG PO TB12
600.0000 mg | ORAL_TABLET | Freq: Two times a day (BID) | ORAL | Status: DC
  Administered 2011-09-20 – 2011-09-22 (×4): 600 mg via ORAL
  Filled 2011-09-20 (×5): qty 1

## 2011-09-20 MED ORDER — INSULIN ASPART 100 UNIT/ML ~~LOC~~ SOLN
8.0000 [IU] | Freq: Once | SUBCUTANEOUS | Status: AC
  Administered 2011-09-20: 8 [IU] via SUBCUTANEOUS
  Filled 2011-09-20: qty 3

## 2011-09-20 MED ORDER — SODIUM CHLORIDE 0.9 % IV SOLN
INTRAVENOUS | Status: AC
  Administered 2011-09-20: 17:00:00 via INTRAVENOUS

## 2011-09-20 MED ORDER — ENOXAPARIN SODIUM 40 MG/0.4ML ~~LOC~~ SOLN
40.0000 mg | SUBCUTANEOUS | Status: DC
  Administered 2011-09-20 – 2011-09-21 (×2): 40 mg via SUBCUTANEOUS
  Filled 2011-09-20 (×3): qty 0.4

## 2011-09-20 MED ORDER — LEVOFLOXACIN IN D5W 500 MG/100ML IV SOLN
500.0000 mg | INTRAVENOUS | Status: DC
  Administered 2011-09-20: 500 mg via INTRAVENOUS
  Filled 2011-09-20 (×2): qty 100

## 2011-09-20 MED ORDER — INSULIN ASPART 100 UNIT/ML ~~LOC~~ SOLN
0.0000 [IU] | Freq: Three times a day (TID) | SUBCUTANEOUS | Status: DC
  Administered 2011-09-21 (×2): 20 [IU] via SUBCUTANEOUS
  Administered 2011-09-21: 11 [IU] via SUBCUTANEOUS
  Administered 2011-09-22: 4 [IU] via SUBCUTANEOUS
  Administered 2011-09-22: 7 [IU] via SUBCUTANEOUS

## 2011-09-20 MED ORDER — NICOTINE 14 MG/24HR TD PT24
14.0000 mg | MEDICATED_PATCH | TRANSDERMAL | Status: DC
  Administered 2011-09-20 – 2011-09-22 (×3): 14 mg via TRANSDERMAL
  Filled 2011-09-20 (×3): qty 1

## 2011-09-20 MED ORDER — PREDNISONE 5 MG PO TABS: 5.0000 mg | ORAL_TABLET | Freq: Every day | ORAL | Status: DC

## 2011-09-20 MED ORDER — LORAZEPAM 0.5 MG PO TABS
0.5000 mg | ORAL_TABLET | Freq: Every day | ORAL | Status: DC
  Administered 2011-09-20 – 2011-09-22 (×3): 0.5 mg via ORAL
  Filled 2011-09-20 (×3): qty 1

## 2011-09-20 MED ORDER — TIOTROPIUM BROMIDE MONOHYDRATE 18 MCG IN CAPS
18.0000 ug | ORAL_CAPSULE | Freq: Every day | RESPIRATORY_TRACT | Status: DC
  Administered 2011-09-21 – 2011-09-22 (×2): 18 ug via RESPIRATORY_TRACT
  Filled 2011-09-20: qty 5

## 2011-09-20 MED ORDER — CALCIUM CARBONATE 1250 (500 CA) MG PO TABS
1.0000 | ORAL_TABLET | Freq: Four times a day (QID) | ORAL | Status: DC
  Administered 2011-09-20 – 2011-09-22 (×6): 500 mg via ORAL
  Filled 2011-09-20 (×10): qty 1

## 2011-09-20 MED ORDER — METHYLPREDNISOLONE SODIUM SUCC 125 MG IJ SOLR
60.0000 mg | Freq: Four times a day (QID) | INTRAMUSCULAR | Status: AC
  Administered 2011-09-20 – 2011-09-21 (×3): 60 mg via INTRAVENOUS
  Filled 2011-09-20 (×2): qty 2
  Filled 2011-09-20: qty 0.96

## 2011-09-20 MED ORDER — NEBIVOLOL HCL 5 MG PO TABS
5.0000 mg | ORAL_TABLET | Freq: Every day | ORAL | Status: DC
  Administered 2011-09-20 – 2011-09-22 (×3): 5 mg via ORAL
  Filled 2011-09-20 (×3): qty 1

## 2011-09-20 MED ORDER — ACETAMINOPHEN 650 MG RE SUPP: 650.0000 mg | Freq: Four times a day (QID) | RECTAL | Status: DC | PRN

## 2011-09-20 MED ORDER — ASPIRIN 81 MG PO TABS: 81.0000 mg | ORAL_TABLET | Freq: Every day | ORAL | Status: DC

## 2011-09-20 MED ORDER — INSULIN GLARGINE 100 UNIT/ML ~~LOC~~ SOLN
90.0000 [IU] | Freq: Every day | SUBCUTANEOUS | Status: DC
  Administered 2011-09-20 – 2011-09-21 (×2): 90 [IU] via SUBCUTANEOUS
  Filled 2011-09-20: qty 3

## 2011-09-20 MED ORDER — DEXTROSE 5 % IV SOLN
1.0000 g | INTRAVENOUS | Status: DC
  Administered 2011-09-20: 1 g via INTRAVENOUS
  Filled 2011-09-20: qty 10

## 2011-09-20 MED ORDER — ALBUTEROL SULFATE (5 MG/ML) 0.5% IN NEBU
2.5000 mg | INHALATION_SOLUTION | Freq: Four times a day (QID) | RESPIRATORY_TRACT | Status: DC
  Administered 2011-09-20 – 2011-09-22 (×7): 2.5 mg via RESPIRATORY_TRACT
  Filled 2011-09-20 (×7): qty 0.5

## 2011-09-20 MED ORDER — INSULIN ASPART 100 UNIT/ML ~~LOC~~ SOLN
22.0000 [IU] | Freq: Three times a day (TID) | SUBCUTANEOUS | Status: DC
  Administered 2011-09-20 – 2011-09-22 (×6): 22 [IU] via SUBCUTANEOUS
  Filled 2011-09-20: qty 3

## 2011-09-20 MED ORDER — ALBUTEROL SULFATE (5 MG/ML) 0.5% IN NEBU
5.0000 mg | INHALATION_SOLUTION | RESPIRATORY_TRACT | Status: AC
  Administered 2011-09-20 (×3): 5 mg via RESPIRATORY_TRACT
  Filled 2011-09-20: qty 0.5
  Filled 2011-09-20 (×2): qty 1

## 2011-09-20 MED ORDER — METHYLPREDNISOLONE SODIUM SUCC 125 MG IJ SOLR
125.0000 mg | Freq: Once | INTRAMUSCULAR | Status: AC
  Administered 2011-09-20: 125 mg via INTRAVENOUS
  Filled 2011-09-20: qty 2

## 2011-09-20 MED ORDER — TRAZODONE HCL 100 MG PO TABS
100.0000 mg | ORAL_TABLET | Freq: Every day | ORAL | Status: DC
  Administered 2011-09-20 – 2011-09-21 (×2): 100 mg via ORAL
  Filled 2011-09-20 (×3): qty 1

## 2011-09-20 MED ORDER — ACETAMINOPHEN 325 MG PO TABS
650.0000 mg | ORAL_TABLET | Freq: Once | ORAL | Status: AC
  Administered 2011-09-20: 650 mg via ORAL
  Filled 2011-09-20: qty 2

## 2011-09-20 MED ORDER — INSULIN ASPART 100 UNIT/ML ~~LOC~~ SOLN: 0.0000 [IU] | Freq: Three times a day (TID) | SUBCUTANEOUS | Status: DC

## 2011-09-20 MED ORDER — FLUOXETINE HCL 20 MG PO CAPS
40.0000 mg | ORAL_CAPSULE | Freq: Every day | ORAL | Status: DC
  Administered 2011-09-20 – 2011-09-22 (×3): 40 mg via ORAL
  Filled 2011-09-20 (×3): qty 2

## 2011-09-20 MED ORDER — PREDNISONE 20 MG PO TABS: 20.0000 mg | ORAL_TABLET | Freq: Every day | ORAL | Status: DC

## 2011-09-20 MED ORDER — ROSUVASTATIN CALCIUM 40 MG PO TABS: 40.0000 mg | ORAL_TABLET | Freq: Every day | ORAL | Status: DC

## 2011-09-20 MED ORDER — ASPIRIN EC 81 MG PO TBEC
81.0000 mg | DELAYED_RELEASE_TABLET | Freq: Every day | ORAL | Status: DC
  Administered 2011-09-21 – 2011-09-22 (×2): 81 mg via ORAL
  Filled 2011-09-20 (×2): qty 1

## 2011-09-20 MED ORDER — OXYCODONE HCL 5 MG PO TABS
5.0000 mg | ORAL_TABLET | ORAL | Status: DC | PRN
  Administered 2011-09-20 – 2011-09-22 (×4): 5 mg via ORAL
  Filled 2011-09-20 (×4): qty 1

## 2011-09-20 MED ORDER — ENOXAPARIN SODIUM 30 MG/0.3ML ~~LOC~~ SOLN: 30.0000 mg | SUBCUTANEOUS | Status: DC

## 2011-09-20 MED ORDER — PREDNISONE 10 MG PO TABS: 10.0000 mg | ORAL_TABLET | Freq: Every day | ORAL | Status: DC

## 2011-09-20 MED ORDER — AZITHROMYCIN 250 MG PO TABS
500.0000 mg | ORAL_TABLET | Freq: Once | ORAL | Status: AC
  Administered 2011-09-20: 500 mg via ORAL
  Filled 2011-09-20: qty 2

## 2011-09-20 MED ORDER — PREDNISONE 20 MG PO TABS
40.0000 mg | ORAL_TABLET | Freq: Every day | ORAL | Status: DC
  Administered 2011-09-21 – 2011-09-22 (×2): 40 mg via ORAL
  Filled 2011-09-20 (×3): qty 2

## 2011-09-20 MED ORDER — ALBUTEROL SULFATE (5 MG/ML) 0.5% IN NEBU: 2.5000 mg | INHALATION_SOLUTION | RESPIRATORY_TRACT | Status: DC | PRN

## 2011-09-20 MED ORDER — SODIUM CHLORIDE 0.9 % IV BOLUS (SEPSIS): 250.0000 mL | Freq: Once | INTRAVENOUS | Status: DC

## 2011-09-20 MED ORDER — PANTOPRAZOLE SODIUM 40 MG PO TBEC
40.0000 mg | DELAYED_RELEASE_TABLET | Freq: Every day | ORAL | Status: DC
  Administered 2011-09-20 – 2011-09-22 (×3): 40 mg via ORAL
  Filled 2011-09-20 (×3): qty 1

## 2011-09-20 MED ORDER — FLUTICASONE-SALMETEROL 250-50 MCG/DOSE IN AEPB
1.0000 | INHALATION_SPRAY | Freq: Two times a day (BID) | RESPIRATORY_TRACT | Status: DC
  Administered 2011-09-20 – 2011-09-22 (×4): 1 via RESPIRATORY_TRACT
  Filled 2011-09-20: qty 14

## 2011-09-20 MED ORDER — POTASSIUM CHLORIDE CRYS ER 20 MEQ PO TBCR: 20.0000 meq | EXTENDED_RELEASE_TABLET | Freq: Once | ORAL | Status: DC

## 2011-09-20 NOTE — Progress Notes (Signed)
Critical Lab value received from lab for pt.  CK-MB 7.3, MD notified and no orders given at this time, will continue to monitor patient.__________________________________________________________________D. Manson Passey RN

## 2011-09-20 NOTE — ED Notes (Signed)
Pt undressed and placed on cardiac monitor, bp cuff, and pulse ox. EKG completed on arrival 

## 2011-09-20 NOTE — Progress Notes (Signed)
Patient's CBG was 451, MD notified and orders were received.  ______________________________D. Manson Passey RN

## 2011-09-20 NOTE — ED Notes (Signed)
New and old ekg handed to Dr.Jon Knapp at [phone]

## 2011-09-20 NOTE — H&P (Signed)
Patient's PCP: Leo Grosser, MD, MD Patient's GI: Dr. Ewing Schlein Patient's Pulmonologist: Dr. Marchelle Gearing  Chief Complaint: Shortness of breath.  History of Present Illness: Mr. Krider is a 60 year old Caucasian male, with a history of hypertension, COPD, type 2 diabetes, obstructive sleep apnea, GERD, thyroid disease, depression, coronary artery disease, and tobacco abuse who presents with the above complaints.  Patient reports that his symptoms started about 2 - 3 weeks ago, with shortness of breath.  He noted that during the same period time he has had increasing shortness of breath with cough productive of greenish sputum.  He had presented to his gastroenterologist yesterday for a regularly scheduled colonoscopy.  He was found to be hypoxic yesterday but a colonoscopy was still done because the patient had taken bowel prep.  He continued to be short of breath with cough productive sputum as a result presented to his primary care physician's office today.  He was given a neb treatment and was then transferred to the emergency department for further care and evaluation.  In the emergency department the patient was started on steroids and neb treatments which improved his breathing.  Given that he was still short of breath the hospitalist service was asked to admit the patient for further care and management.  Patient denies any fevers.  Denies any chills.  Feels diaphoretic at this time.  Feels like his shortness of breath is improved after neb treatment.  Does complain of chest pain but associates that with the cough.  Denies any abdominal pain but does complain of cramps.  Attributes the cramps to the colon prep that he had yesterday.  Denies any headaches or vision changes.  Meds: Scheduled Meds:   . acetaminophen  650 mg Oral Once  . albuterol  5 mg Nebulization Q1 Hr x 3  . azithromycin  500 mg Oral Once  . cefTRIAXone (ROCEPHIN) IV  1 g Intravenous Q24H  . ipratropium  0.5 mg Nebulization  Once  . methylPREDNISolone sodium succinate  125 mg Intravenous Once  . potassium chloride  20 mEq Oral Once   Continuous Infusions:  PRN Meds:. Allergies: Review of patient's allergies indicates no known allergies. Past Medical History  Diagnosis Date  . Hypertension   . COPD (chronic obstructive pulmonary disease)   . Diabetes mellitus   . Sleep apnea   . Anemia   . Allergy   . GERD (gastroesophageal reflux disease)   . Thyroid disease   . Pneumonia   . Depression    Past Surgical History  Procedure Date  . Carpal tunnel release   . Hernia repair   . Rotator cuff repair    Family History  Problem Relation Age of Onset  . COPD Mother   . Heart attack Father    History   Social History  . Marital Status: Married    Spouse Name: N/A    Number of Children: N/A  . Years of Education: N/A   Occupational History  . Not on file.   Social History Main Topics  . Smoking status: Smoker, Current Status Unknown -- 1.0 packs/day    Types: Cigarettes    Last Attempt to Quit: 09/13/2011  . Smokeless tobacco: Not on file  . Alcohol Use: Yes     states drank 2-3 pints daily until about 6 weeks ago.  . Drug Use:   . Sexually Active:    Other Topics Concern  . Not on file   Social History Narrative  . No narrative on file  Review of Systems: All systems reviewed with the patient and positive as per history of present illness, otherwise all other systems are negative.  Physical Exam: Blood pressure 91/58, pulse 91, temperature 98.9 F (37.2 C), temperature source Oral, resp. rate 24, SpO2 94.00%. Physical Exam: General: Awake, Oriented x3, in some respiratory distress but was able talking complete sentences. HEENT: EOMI, Moist mucous membranes Neck: Supple CV: S1 and S2 Lungs: Scattered expiratory wheezes bilaterally.  Moderate respiratory effort. Abdomen: Soft, Nontender, Nondistended, +bowel sounds. Ext: Good pulses. Trace edema. No clubbing or cyanosis  noted. Neuro: Cranial Nerves II-XII grossly intact. Has 5/5 motor strength in upper and lower extremities. Skin: Diaphoretic.  Lab results:  [facility] 09/20/11 0936  NA 135  K 3.3*  CL 95*  CO2 21  GLUCOSE 195*  BUN 35*  CREATININE 1.56*  CALCIUM 7.5*  MG --  PHOS --   No results found for this basename: AST:2,ALT:2,ALKPHOS:2,BILITOT:2,PROT:2,ALBUMIN:2 in the last 72 hours No results found for this basename: LIPASE:2,AMYLASE:2 in the last 72 hours  Basename 09/20/11 0936  WBC 10.7*  NEUTROABS --  HGB 11.6*  HCT 33.7*  MCV 90.6  PLT 211   No results found for this basename: CKTOTAL:3,CKMB:3,CKMBINDEX:3,TROPONINI:3 in the last 72 hours  Basename 09/20/11 0936  POCBNP 178.2*   No results found for this basename: DDIMER in the last 72 hours No results found for this basename: HGBA1C:2 in the last 72 hours No results found for this basename: CHOL:2,HDL:2,LDLCALC:2,TRIG:2,CHOLHDL:2,LDLDIRECT:2 in the last 72 hours No results found for this basename: TSH,T4TOTAL,FREET3,T3FREE,THYROIDAB in the last 72 hours No results found for this basename: VITAMINB12:2,FOLATE:2,FERRITIN:2,TIBC:2,IRON:2,RETICCTPCT:2 in the last 72 hours Imaging results:  Dg Chest 2 View  09/20/2011  *RADIOLOGY REPORT*  Clinical Data: Increasing shortness of breath, hypertension, COPD, diabetes  CHEST - 2 VIEW  Comparison: Chest x-ray of 11/16/2010  Findings: There is cardiomegaly present.  There are prominent markings throughout the lungs some which are chronic.  However pneumonia particularly in the right upper lobe abutting the fissure on the frontal view is a definite consideration.  Also mild pulmonary vascular congestion cannot be excluded.  There is some peribronchial thickening which may indicate bronchitis.  No acute bony abnormality is seen.  IMPRESSION:  1.  Mild cardiomegaly with question mild pulmonary vascular congestion.  Probable chronic interstitial change as well. 2.  Cannot exclude pneumonia  abutting the fissure anteriorly in the right upper lobe.  Original Report Authenticated By: Juline Patch, M.D.   Other results: EKG: normal EKG, normal sinus rhythm, unchanged from previous tracings.  Assessment & Plan by Problem: 1.  Acute respiratory failure with hypoxia.  Likely due to COPD exacerbation.  Patient has been started on steroids and neb treatment in the emergency department which will be continued.  Continue oxygen.  Given the patient is complaining about cough productive sputum will have the patient on antibiotic (levofloxacin).  Will do a walking desaturation in the morning.  Will determine if the patient needs to be 24 hour oxygen rather than at the nighttime use which is what the patient has at this time.  Trend troponins x3 given the patient is complaining about chest pain.  Suspect that the patient's chest pain is likely due to muscular skeletal-related from cough, patient acknowledges that his chest pain is worse with cough.  2. COPD exacerbation management as indicated above.  3. Tobacco use.  Encouraged smoking cessation.  Will give the patient nicotine patches during this hospitalization.  4. Hypertension, with now hypotension.  Suspect the patient has a mild dehydration which is causing hypertension.  5. DM type 2 (diabetes mellitus, type 2).  Continue home insulin regimen.  Will have the patient on sliding scale insulin.  Suspect that his blood sugars may be high due to steroids.  6. Sleep apnea.  Continue CPAP.  7. ARF (acute renal failure).  Suspect the patient is dehydrated.  Also reports that he had diarrhea from colon prep yesterday, colon prep should not cause acute renal failure.  Suspect that #1 is causing dehydration.  8. Tachycardia.  Likely due to neb treatment.  9. Hypokalemia.  Replace as needed.  10. Prophylaxis.  Lovenox for DVT prophylaxis.  11. Code status.  Full code.  Time spent on admission, talking to the patient, and coordinating care  was: 60 mins.  Shalin Vonbargen A, MD 09/20/2011, 2:09 PM

## 2011-09-20 NOTE — ED Provider Notes (Signed)
History     CSN: [id number] Arrival date & time: 09/20/2011  9:14 AM   First MD Initiated Contact with Patient 09/20/11 [phone]      Chief Complaint  Patient presents with  . Shortness of Breath    x 5 days    (Consider location/radiation/quality/duration/timing/severity/associated sxs/prior treatment) HPI Patient presents to the emergency room with shortness of breath this been ongoing for 5 days. Patient states he has a history of COPD. He continues to smoke but did stop about a week ago when he started to get sick.  Patient states she's been bringing up clear to yellow sputum. The shortness of breath gets worse when he tries to exert himself. However, it is married and when he is just at rest. He has not had any fevers, chills, chest pain, new swelling. He has been using his CPAP at night with oxygen as well as albuterol treatments at home without improvement. Patient went to his doctor's office today and was found to have sats in the low 70s. He was given an albuterol treatment and by the time EMS arrived his oxygen saturation was still in the 80s but increased to into the 90s during the transport to the ED. Past Medical History  Diagnosis Date  . Hypertension   . COPD (chronic obstructive pulmonary disease)   . Diabetes mellitus   . Depression   . Sleep apnea   . Anemia   . Allergy   . GERD (gastroesophageal reflux disease)   . Thyroid disease   . Pneumonia     Past Surgical History  Procedure Date  . Carpal tunnel release   . Hernia repair   . Rotator cuff repair     Family History  Problem Relation Age of Onset  . Heart failure Mother   . Lung disease Father     History  Substance Use Topics  . Smoking status: Former Smoker    Types: Cigarettes    Quit date: 09/13/2011  . Smokeless tobacco: Not on file  . Alcohol Use: Yes     states drank 2-3 pints daily until about 6 weeks ago      Review of Systems  All other systems reviewed and are  negative.    Allergies  Review of patient's allergies indicates no known allergies.  Home Medications   Current Outpatient Rx  Name Route Sig Dispense Refill  . ACETAMINOPHEN 500 MG PO TABS Oral Take 500 mg by mouth every 6 (six) hours as needed.      . ALBUTEROL SULFATE (2.5 MG/3ML) 0.083% IN NEBU Nebulization Take 2.5 mg by nebulization every 6 (six) hours as needed.      . ASPIRIN 81 MG PO TABS Oral Take 81 mg by mouth daily.      Marland Kitchen EZETIMIBE-SIMVASTATIN 10-80 MG PO TABS Oral Take 1 tablet by mouth at bedtime.      . FLUOXETINE HCL 40 MG PO CAPS Oral Take 40 mg by mouth daily.      . IBUPROFEN 100 MG PO TABS Oral Take 100 mg by mouth every 6 (six) hours as needed.      . INSULIN ASPART 100 UNIT/ML [place] SOLN Subcutaneous Inject 22 Units into the skin 3 (three) times daily before meals.      . INSULIN GLARGINE 100 UNIT/ML [place] SOLN Subcutaneous Inject 90 Units into the skin daily.      Marland Kitchen LORAZEPAM 0.5 MG PO TABS Oral Take 0.5 mg by mouth 3 (three) times daily.      Marland Kitchen  NEBIVOLOL HCL 5 MG PO TABS Oral Take 5 mg by mouth daily.      Marland Kitchen OMEPRAZOLE 20 MG PO CPDR Oral Take 20 mg by mouth daily.      Marland Kitchen ROSUVASTATIN CALCIUM 40 MG PO TABS Oral Take 40 mg by mouth daily.      Marland Kitchen TADALAFIL 10 MG PO TABS Oral Take 100 mg by mouth daily as needed.      . THIAMINE HCL 100 MG PO TABS Oral Take 100 mg by mouth daily.      Marland Kitchen TIOTROPIUM BROMIDE MONOHYDRATE 18 MCG IN CAPS Inhalation Place 18 mcg into inhaler and inhale daily.      . TRAZODONE HCL 100 MG PO TABS Oral Take 100 mg by mouth at bedtime.        BP 98/52  Pulse 102  Temp(Src) 98.9 F (37.2 C) (Oral)  Resp 22  SpO2 91%  Physical Exam  Nursing note and vitals reviewed. Constitutional: He appears well-developed and well-nourished.  HENT:  Head: Normocephalic and atraumatic.  Right Ear: External ear normal.  Left Ear: External ear normal.  Eyes: Conjunctivae are normal. Right eye exhibits no discharge. Left eye exhibits no discharge. No  scleral icterus.  Neck: Neck supple. No tracheal deviation present.  Cardiovascular: Normal rate, regular rhythm and intact distal pulses.   Pulmonary/Chest: Effort normal. No stridor. He has wheezes in the right upper field, the right middle field, the right lower field, the left upper field, the left middle field and the left lower field. He has no rales.  Abdominal: Soft. Bowel sounds are normal. He exhibits no distension. There is no tenderness. There is no rebound and no guarding.  Musculoskeletal: He exhibits no edema and no tenderness.  Neurological: He is alert. He has normal strength. No sensory deficit. Cranial nerve deficit:  no gross defecits noted. He exhibits normal muscle tone. He displays no seizure activity. Coordination normal.  Skin: Skin is warm and dry. No rash noted.  Psychiatric: He has a normal mood and affect.    ED Course  Procedures (including critical care time)  Date: 09/20/2011  Rate: 97  Rhythm: normal sinus rhythm  QRS Axis: normal  Intervals: normal  ST/T Wave abnormalities: normal  Conduction Disutrbances:none  Narrative Interpretation:   Old EKG Reviewed: unchanged  Medications  albuterol (PROVENTIL) (5 MG/ML) 0.5% nebulizer solution 5 mg (5 mg Nebulization Given 09/20/11 1134)  calcium carbonate (OS-CAL - DOSED IN MG OF ELEMENTAL CALCIUM) 1250 MG tablet (not administered)  acetaminophen (TYLENOL) tablet 650 mg (not administered)  azithromycin (ZITHROMAX) tablet 500 mg (not administered)  cefTRIAXone (ROCEPHIN) 1 g in dextrose 5 % 50 mL IVPB (not administered)  methylPREDNISolone sodium succinate (SOLU-MEDROL) 125 MG injection 125 mg (125 mg Intravenous Given 09/20/11 0947)  ipratropium (ATROVENT) nebulizer solution 0.5 mg (0.5 mg Nebulization Given 09/20/11 0947)     Labs Reviewed  PRO B NATRIURETIC PEPTIDE - Abnormal; Notable for the following:    BNP, POC 178.2 (*)    All other components within normal limits  BASIC METABOLIC PANEL -  Abnormal; Notable for the following:    Potassium 3.3 (*)    Chloride 95 (*)    Glucose, Bld 195 (*)    BUN 35 (*)    Creatinine, Ser 1.56 (*)    Calcium 7.5 (*)    GFR calc non Af Amer 46 (*)    GFR calc Af Amer 54 (*)    All other components within normal limits  CBC -  Abnormal; Notable for the following:    WBC 10.7 (*)    RBC 3.72 (*)    Hemoglobin 11.6 (*)    HCT 33.7 (*)    All other components within normal limits  POCT I-STAT TROPONIN I  I-STAT TROPONIN I   Dg Chest 2 View  09/20/2011  *RADIOLOGY REPORT*  Clinical Data: Increasing shortness of breath, hypertension, COPD, diabetes  CHEST - 2 VIEW  Comparison: Chest x-ray of 11/16/2010  Findings: There is cardiomegaly present.  There are prominent markings throughout the lungs some which are chronic.  However pneumonia particularly in the right upper lobe abutting the fissure on the frontal view is a definite consideration.  Also mild pulmonary vascular congestion cannot be excluded.  There is some peribronchial thickening which may indicate bronchitis.  No acute bony abnormality is seen.  IMPRESSION:  1.  Mild cardiomegaly with question mild pulmonary vascular congestion.  Probable chronic interstitial change as well. 2.  Cannot exclude pneumonia abutting the fissure anteriorly in the right upper lobe.  Original Report Authenticated By: Juline Patch, M.D.     1. COPD (chronic obstructive pulmonary disease)   2. Hypoxia   3. Community acquired pneumonia       MDM  10:07 AM Symptoms most suggestive of a COPD exacerbation. I will begin treatment with Solu-Medrol and albuterol Atrovent treatments. Chest x-ray and additional testing for cardiac etiology will be done.  11:33 AM patient with decreased wheezing but still having shortness of breath. His oxygen saturation is low 90s but he is on 4 L nasal cannula. At this time I suspect his primary etiology is a COPD exacerbation. As the x-ray suggests he could have a mild component  of pneumonia. Patient has not been in the hospital recently with start him  Antibiotics for a community-acquired pneumonia        Celene Kras, MD 09/20/11 1136

## 2011-09-20 NOTE — ED Notes (Signed)
Pt to ED via [facility] from [facility].  Seen there by appt for sob x 5 days.  Cough productive of green sputum.  IV established by MD's office (20g right fa).  Per EMS, pt's oxygen sat in 70's initially at MD's office.  Breathing tx given.  Sats in 80's on EMS arrival and have risen to 90's en route.

## 2011-09-20 NOTE — Progress Notes (Signed)
ANTIBIOTIC CONSULT NOTE - INITIAL  Pharmacy Consult for levaquin Indication: possible RUL PNA, COPD exacerbation  No Known Allergies  Patient Measurements: Height: 5\' 11"  (180.3 cm) Weight: 281 lb 15.5 oz (127.9 kg) IBW/kg (Calculated) : 75.3  Adjusted Body Weight: 91.1kg Vital Signs: Temp: 97.6 F (36.4 C) (11/15 1423) Temp src: Oral (11/15 1423) BP: 95/57 mmHg (11/15 1423) Pulse Rate: 93  (11/15 1423) Intake/Output from previous day:   Intake/Output from this shift:    Labs:  [facility] 09/20/11 0936  WBC 10.7*  HGB 11.6*  PLT 211  LABCREA --  CREATININE 1.56*   Estimated Creatinine Clearance: 67.7 ml/min (by C-G formula based on Cr of 1.56).    Microbiology: No results found for this or any previous visit (from the past 720 hour(s)).  Medical History: Past Medical History  Diagnosis Date  . Hypertension   . COPD (chronic obstructive pulmonary disease)   . Diabetes mellitus   . Sleep apnea   . Anemia   . Allergy   . GERD (gastroesophageal reflux disease)   . Thyroid disease   . Pneumonia   . Depression   . Shortness of breath   . Parathyroid disease   . Coronary artery disease     hx of cardiac stents  . Headache     Medications:  Scheduled:    . acetaminophen  650 mg Oral Once  . albuterol  2.5 mg Nebulization Q6H  . albuterol  5 mg Nebulization Q1 Hr x 3  . aspirin EC  81 mg Oral Daily  . azithromycin  500 mg Oral Once  . calcium carbonate  1 tablet Oral QID  . enoxaparin (LOVENOX) injection  40 mg Subcutaneous Q24H  . ezetimibe-simvastatin  1 tablet Oral QHS  . FLUoxetine  40 mg Oral Daily  . Fluticasone-Salmeterol  1 puff Inhalation BID  . guaiFENesin  600 mg Oral BID  . insulin aspart  0-15 Units Subcutaneous TID WC  . insulin aspart  22 Units Subcutaneous TID AC  . insulin glargine  90 Units Subcutaneous QHS  . ipratropium  0.5 mg Nebulization Once  . ipratropium  0.5 mg Nebulization Q6H  . levofloxacin (LEVAQUIN) IV  500 mg  Intravenous Q24H  . LORazepam  0.5 mg Oral Daily  . methylPREDNISolone sodium succinate  125 mg Intravenous Once  . methylPREDNISolone (SOLU-MEDROL) injection  60 mg Intravenous Q6H  . nebivolol  5 mg Oral Daily  . nicotine  14 mg Transdermal Q24H  . pantoprazole  40 mg Oral Q1200  . predniSONE  40 mg Oral QAC breakfast   Followed by  . predniSONE  20 mg Oral QAC breakfast   Followed by  . predniSONE  10 mg Oral QAC breakfast   Followed by  . predniSONE  5 mg Oral QAC breakfast  . tiotropium  18 mcg Inhalation Daily  . traZODone  100 mg Oral QHS  . DISCONTD: aspirin  81 mg Oral Daily  . DISCONTD: cefTRIAXone (ROCEPHIN) IV  1 g Intravenous Q24H  . DISCONTD: enoxaparin  30 mg Subcutaneous Q24H  . DISCONTD: potassium chloride  20 mEq Oral Once  . DISCONTD: rosuvastatin  40 mg Oral Daily  . DISCONTD: sodium chloride  250 mL Intravenous Once   Assessment: 60 yo man with COPD exacerbation, possible RUL PNA who has received 1gm Rocephin and 500mg  po azithromycin to start Levaquin. ARF 2nd dehydration. Creat cl > 50 ml/min  Plan:  levaquin 500 mg IV q24, f/u renal function and culture  results  Len Childs T 09/20/2011,3:26 PM Pager: [phone]

## 2011-09-21 LAB — BASIC METABOLIC PANEL
BUN: 38 mg/dL — ABNORMAL HIGH (ref 6–23)
Calcium: 8.5 mg/dL (ref 8.4–10.5)
GFR calc Af Amer: 78 mL/min — ABNORMAL LOW (ref >90)
GFR calc non Af Amer: 68 mL/min — ABNORMAL LOW (ref >90)
Glucose, Bld: 362 mg/dL — ABNORMAL HIGH (ref 70–99)
Potassium: 4.1 mEq/L (ref 3.5–5.1)
Sodium: 133 mEq/L — ABNORMAL LOW (ref 135–145)

## 2011-09-21 LAB — CARDIAC PANEL(CRET KIN+CKTOT+MB+TROPI)
CK, MB: 9.6 ng/mL (ref 0.3–4.0)
Total CK: 716 U/L — ABNORMAL HIGH (ref 7–232)
Troponin I: <0.3 ng/mL (ref <0.30)

## 2011-09-21 LAB — CBC
MCH: 30.8 pg (ref 26.0–34.0)
MCHC: 33.8 g/dL (ref 30.0–36.0)
Platelets: 223 10*3/uL (ref 150–400)
RBC: 3.67 MIL/uL — ABNORMAL LOW (ref 4.22–5.81)

## 2011-09-21 LAB — GLUCOSE, CAPILLARY: Glucose-Capillary: 183 mg/dL — ABNORMAL HIGH (ref 70–99)

## 2011-09-21 LAB — MAGNESIUM: Magnesium: 1.3 mg/dL — ABNORMAL LOW (ref 1.5–2.5)

## 2011-09-21 MED ORDER — LEVOFLOXACIN 500 MG PO TABS
500.0000 mg | ORAL_TABLET | Freq: Every day | ORAL | Status: DC
  Administered 2011-09-21 – 2011-09-22 (×2): 500 mg via ORAL
  Filled 2011-09-21 (×2): qty 1

## 2011-09-21 NOTE — Progress Notes (Signed)
Ambulating pulse oximetry sats: While pt in room with oxygen on 2L at rest O2 sats= 94%. Removed Oxygen at rest pt desats to 89%. While pt ambulating O2 sats dropped to 82%. Placed O2 back on at 2L ambulating pt recovered to 90%. Back in room at rest on 2L pt recovers to 94%.

## 2011-09-21 NOTE — Progress Notes (Signed)
09/21/2011 [name] Case Management Note [phone]       Utilization review completed.  

## 2011-09-21 NOTE — Progress Notes (Signed)
Subjective: Reports that his breathing is improved significantly.  Continues to have short of breath.  Objective: Vital signs in last 24 hours: Filed Vitals:   09/21/11 0217 09/21/11 0228 09/21/11 0547 09/21/11 0855  BP:  151/80 124/71   Pulse:  84 75   Temp:  97.9 F (36.6 C) 97.5 F (36.4 C)   TempSrc:      Resp:  20 20   Height:      Weight:  128.9 kg (284 lb 2.8 oz)    SpO2: 93% 92% 93% 93%   Weight change:   Intake/Output Summary (Last 24 hours) at 09/21/11 1239 Last data filed at 09/21/11 0902  Gross per 24 hour  Intake 1711.25 ml  Output   1875 ml  Net -163.75 ml    Physical Exam: General: Awake, Oriented, No acute distress. HEENT: EOMI. Neck: Supple CV: S1 and S2 Lungs: Scattered expiratory wheezing bilaterally.  Moderate air movement.  Improved from yesterday. Abdomen: Soft, Nontender, Nondistended, +bowel sounds. Ext: Good pulses. Trace edema.  Lab Results:  Basename 09/21/11 0505 09/20/11 1743 09/20/11 1617 09/20/11 0936  NA 133* -- -- 135  K 4.1 -- -- 3.3*  CL 96 -- -- 95*  CO2 23 -- -- 21  GLUCOSE 362* 451* -- --  BUN 38* -- -- 35*  CREATININE 1.14 -- 1.69* --  CALCIUM 8.5 -- -- 7.5*  MG 1.3* -- -- --  PHOS -- -- -- --   No results found for this basename: AST:2,ALT:2,ALKPHOS:2,BILITOT:2,PROT:2,ALBUMIN:2 in the last 72 hours No results found for this basename: LIPASE:2,AMYLASE:2 in the last 72 hours  Basename 09/21/11 0505 09/20/11 1617  WBC 7.5 9.3  NEUTROABS -- --  HGB 11.3* 11.2*  HCT 33.4* 33.2*  MCV 91.0 91.5  PLT 223 223    Basename 09/20/11 2339 09/20/11 1731 09/20/11 1617  CKTOTAL 716* 765* 769*  CKMB 9.6* 7.8* 7.3*  CKMBINDEX -- -- --  TROPONINI <0.30 <0.30 <0.30    Basename 09/20/11 0936  POCBNP 178.2*   No results found for this basename: DDIMER:2 in the last 72 hours No results found for this basename: HGBA1C:2 in the last 72 hours No results found for this basename: CHOL:2,HDL:2,LDLCALC:2,TRIG:2,CHOLHDL:2,LDLDIRECT:2  in the last 72 hours No results found for this basename: TSH,T4TOTAL,FREET3,T3FREE,THYROIDAB in the last 72 hours No results found for this basename: VITAMINB12:2,FOLATE:2,FERRITIN:2,TIBC:2,IRON:2,RETICCTPCT:2 in the last 72 hours  Micro Results: No results found for this or any previous visit (from the past 240 hour(s)).  Studies/Results: Dg Chest 2 View  09/20/2011  *RADIOLOGY REPORT*  Clinical Data: Increasing shortness of breath, hypertension, COPD, diabetes  CHEST - 2 VIEW  Comparison: Chest x-ray of 11/16/2010  Findings: There is cardiomegaly present.  There are prominent markings throughout the lungs some which are chronic.  However pneumonia particularly in the right upper lobe abutting the fissure on the frontal view is a definite consideration.  Also mild pulmonary vascular congestion cannot be excluded.  There is some peribronchial thickening which may indicate bronchitis.  No acute bony abnormality is seen.  IMPRESSION:  1.  Mild cardiomegaly with question mild pulmonary vascular congestion.  Probable chronic interstitial change as well. 2.  Cannot exclude pneumonia abutting the fissure anteriorly in the right upper lobe.  Original Report Authenticated By: Juline Patch, M.D.    Medications: I have reviewed the patient's current medications. Scheduled Meds:   . albuterol  2.5 mg Nebulization Q6H  . aspirin EC  81 mg Oral Daily  . calcium carbonate  1  tablet Oral QID  . enoxaparin (LOVENOX) injection  40 mg Subcutaneous Q24H  . ezetimibe-simvastatin  1 tablet Oral QHS  . FLUoxetine  40 mg Oral Daily  . Fluticasone-Salmeterol  1 puff Inhalation BID  . guaiFENesin  600 mg Oral BID  . insulin aspart  0-20 Units Subcutaneous TID WC  . insulin aspart  0-5 Units Subcutaneous QHS  . insulin aspart  22 Units Subcutaneous TID AC  . insulin aspart  8 Units Subcutaneous Once  . insulin glargine  90 Units Subcutaneous QHS  . ipratropium  0.5 mg Nebulization Q6H  . levofloxacin (LEVAQUIN)  IV  500 mg Intravenous Q24H  . LORazepam  0.5 mg Oral Daily  . methylPREDNISolone (SOLU-MEDROL) injection  60 mg Intravenous Q6H  . nebivolol  5 mg Oral Daily  . nicotine  14 mg Transdermal Q24H  . pantoprazole  40 mg Oral Q1200  . predniSONE  40 mg Oral QAC breakfast   Followed by  . predniSONE  20 mg Oral QAC breakfast   Followed by  . predniSONE  10 mg Oral QAC breakfast   Followed by  . predniSONE  5 mg Oral QAC breakfast  . tiotropium  18 mcg Inhalation Daily  . traZODone  100 mg Oral QHS  . DISCONTD: aspirin  81 mg Oral Daily  . DISCONTD: cefTRIAXone (ROCEPHIN) IV  1 g Intravenous Q24H  . DISCONTD: enoxaparin  30 mg Subcutaneous Q24H  . DISCONTD: insulin aspart  0-15 Units Subcutaneous TID WC  . DISCONTD: potassium chloride  20 mEq Oral Once  . DISCONTD: rosuvastatin  40 mg Oral Daily  . DISCONTD: sodium chloride  250 mL Intravenous Once   Continuous Infusions:   . sodium chloride 75 mL/hr at 09/20/11 1631   PRN Meds:.acetaminophen, acetaminophen, albuterol, oxyCODONE  Assessment/Plan: 1. Acute respiratory failure with hypoxia. Due to COPD exacerbation. Improved. Continue steroids and neb treatment. Continue Levofloxacin, Abx since 09/20/2011. Walking desaturation today indicates the patient is hypoxic and would qualify for home O2.  2. COPD exacerbation management as indicated above.   3. Tobacco use. Encouraged smoking cessation.  Indicated that he Will not smoke after discharge.  4. Hypertension, with now hypotension. Hypotension resolved, likely from dehydration.   5. DM type 2 (diabetes mellitus, type 2). Continue home insulin regimen. Uncontrolled due to steroids.   6. Sleep apnea. Continue CPAP.   7. ARF (acute renal failure). Improved likely from dehydration.   8. Tachycardia. Likely due to neb treatment, resolved.  9. Hypokalemia. Replaced.   10. Prophylaxis. Lovenox for DVT prophylaxis.   11. Code status. Full code.  12. Disposition. Consider  discharge tomorrow if breathing continues to improve.   LOS: 1 day  Rivers Hamrick A, MD 09/21/2011, 12:39 PM

## 2011-09-21 NOTE — Progress Notes (Signed)
Inpatient Diabetes Program Recommendations  AACE/ADA: New Consensus Statement on Inpatient Glycemic Control (2009)  Target Ranges:  Prepandial:   less than 140 mg/dL      Peak postprandial:   less than 180 mg/dL (1-2 hours)      Critically ill patients:  140 - 180 mg/dL   Reason for Visit:CBGs continue to be greater than 200 mg/d l Increase RESISTANT Novolog correction scale to every 4 hours.  Increase Lantus to 100 units daily.     Note:

## 2011-09-22 LAB — BASIC METABOLIC PANEL
Calcium: 8.5 mg/dL (ref 8.4–10.5)
Creatinine, Ser: 0.97 mg/dL (ref 0.50–1.35)
GFR calc Af Amer: >90 mL/min (ref >90)
GFR calc non Af Amer: 87 mL/min — ABNORMAL LOW (ref >90)
Sodium: 136 mEq/L (ref 135–145)

## 2011-09-22 MED ORDER — NICOTINE 14 MG/24HR TD PT24: 1.0000 | MEDICATED_PATCH | TRANSDERMAL | Status: AC

## 2011-09-22 MED ORDER — NICOTINE 14 MG/24HR TD PT24: 30.0000 | MEDICATED_PATCH | TRANSDERMAL | Status: DC

## 2011-09-22 MED ORDER — PREDNISONE 10 MG PO TABS: 10.0000 mg | ORAL_TABLET | Freq: Every day | ORAL | Status: AC

## 2011-09-22 MED ORDER — FLUTICASONE-SALMETEROL 250-50 MCG/DOSE IN AEPB: 1.0000 | INHALATION_SPRAY | Freq: Two times a day (BID) | RESPIRATORY_TRACT | Status: DC

## 2011-09-22 MED ORDER — ALBUTEROL SULFATE (5 MG/ML) 0.5% IN NEBU: 2.5000 mg | INHALATION_SOLUTION | RESPIRATORY_TRACT | Status: DC | PRN

## 2011-09-22 MED ORDER — INSULIN GLARGINE 100 UNIT/ML ~~LOC~~ SOLN: 90.0000 [IU] | Freq: Every day | SUBCUTANEOUS | Status: DC

## 2011-09-22 MED ORDER — IPRATROPIUM BROMIDE 0.02 % IN SOLN: 0.5000 mg | Freq: Four times a day (QID) | RESPIRATORY_TRACT | Status: DC

## 2011-09-22 MED ORDER — LEVOFLOXACIN 500 MG PO TABS: 500.0000 mg | ORAL_TABLET | Freq: Every day | ORAL | Status: AC

## 2011-09-22 NOTE — Progress Notes (Signed)
Patient's IV d/c, pt's tele d/c; patient and family verbalizes understanding of discharge and medication instructions. Pt sent with home oxygen _____________________________________________________________________D. Manson Passey RN

## 2011-09-22 NOTE — Progress Notes (Signed)
Subjective: Reports that his breathing is better. Wants to go home today.  Objective: Vital signs in last 24 hours: Filed Vitals:   09/21/11 2147 09/21/11 2311 09/22/11 0212 09/22/11 0348  BP:    145/77  Pulse:  78  59  Temp:    97.5 F (36.4 C)  TempSrc:      Resp:  18  20  Height:      Weight:    130 kg (286 lb 9.6 oz)  SpO2: 94% 95% 94% 92%   Weight change: 2.1 kg (4 lb 10.1 oz)  Intake/Output Summary (Last 24 hours) at 09/22/11 1021 Last data filed at 09/22/11 0900  Gross per 24 hour  Intake    480 ml  Output      0 ml  Net    480 ml    Physical Exam: General: Awake, Oriented, No acute distress. HEENT: EOMI. Neck: Supple CV: S1 and S2 Lungs: Scattered expiratory wheezing bilaterally.  Moderate air movement. Abdomen: Soft, Nontender, Nondistended, +bowel sounds. Ext: Good pulses. Trace edema.  Lab Results:  [facility] 09/22/11 0640 09/21/11 0505  NA 136 133*  K 4.1 4.1  CL 100 96  CO2 25 23  GLUCOSE 217* 362*  BUN 31* 38*  CREATININE 0.97 1.14  CALCIUM 8.5 8.5  MG -- 1.3*  PHOS -- --   No results found for this basename: AST:2,ALT:2,ALKPHOS:2,BILITOT:2,PROT:2,ALBUMIN:2 in the last 72 hours No results found for this basename: LIPASE:2,AMYLASE:2 in the last 72 hours  Basename 09/21/11 0505 09/20/11 1617  WBC 7.5 9.3  NEUTROABS -- --  HGB 11.3* 11.2*  HCT 33.4* 33.2*  MCV 91.0 91.5  PLT 223 223    Basename 09/20/11 2339 09/20/11 1731 09/20/11 1617  CKTOTAL 716* 765* 769*  CKMB 9.6* 7.8* 7.3*  CKMBINDEX -- -- --  TROPONINI <0.30 <0.30 <0.30    Basename 09/20/11 0936  POCBNP 178.2*   No results found for this basename: DDIMER:2 in the last 72 hours No results found for this basename: HGBA1C:2 in the last 72 hours No results found for this basename: CHOL:2,HDL:2,LDLCALC:2,TRIG:2,CHOLHDL:2,LDLDIRECT:2 in the last 72 hours No results found for this basename: TSH,T4TOTAL,FREET3,T3FREE,THYROIDAB in the last 72 hours No results found for this  basename: VITAMINB12:2,FOLATE:2,FERRITIN:2,TIBC:2,IRON:2,RETICCTPCT:2 in the last 72 hours  Micro Results: No results found for this or any previous visit (from the past 240 hour(s)).  Studies/Results: Dg Chest 2 View  09/20/2011  *RADIOLOGY REPORT*  Clinical Data: Increasing shortness of breath, hypertension, COPD, diabetes  CHEST - 2 VIEW  Comparison: Chest x-ray of 11/16/2010  Findings: There is cardiomegaly present.  There are prominent markings throughout the lungs some which are chronic.  However pneumonia particularly in the right upper lobe abutting the fissure on the frontal view is a definite consideration.  Also mild pulmonary vascular congestion cannot be excluded.  There is some peribronchial thickening which may indicate bronchitis.  No acute bony abnormality is seen.  IMPRESSION:  1.  Mild cardiomegaly with question mild pulmonary vascular congestion.  Probable chronic interstitial change as well. 2.  Cannot exclude pneumonia abutting the fissure anteriorly in the right upper lobe.  Original Report Authenticated By: Juline Patch, M.D.    Medications: I have reviewed the patient's current medications. Scheduled Meds:    . albuterol  2.5 mg Nebulization Q6H  . aspirin EC  81 mg Oral Daily  . calcium carbonate  1 tablet Oral QID  . enoxaparin (LOVENOX) injection  40 mg Subcutaneous Q24H  . ezetimibe-simvastatin  1 tablet  Oral QHS  . FLUoxetine  40 mg Oral Daily  . Fluticasone-Salmeterol  1 puff Inhalation BID  . guaiFENesin  600 mg Oral BID  . insulin aspart  0-20 Units Subcutaneous TID WC  . insulin aspart  0-5 Units Subcutaneous QHS  . insulin aspart  22 Units Subcutaneous TID AC  . insulin glargine  90 Units Subcutaneous QHS  . ipratropium  0.5 mg Nebulization Q6H  . levofloxacin  500 mg Oral Daily  . LORazepam  0.5 mg Oral Daily  . nebivolol  5 mg Oral Daily  . nicotine  14 mg Transdermal Q24H  . pantoprazole  40 mg Oral Q1200  . predniSONE  40 mg Oral QAC breakfast     Followed by  . predniSONE  20 mg Oral QAC breakfast   Followed by  . predniSONE  10 mg Oral QAC breakfast   Followed by  . predniSONE  5 mg Oral QAC breakfast  . tiotropium  18 mcg Inhalation Daily  . traZODone  100 mg Oral QHS  . DISCONTD: levofloxacin (LEVAQUIN) IV  500 mg Intravenous Q24H   Continuous Infusions:  PRN Meds:.acetaminophen, acetaminophen, albuterol, oxyCODONE  Assessment/Plan: 1. Acute respiratory failure with hypoxia. Due to COPD exacerbation. Improved. Continue steroids and neb treatment. Continue Levofloxacin, Abx since 09/20/2011. Walking desaturation yesterday indicates the patient is hypoxic and would qualify for home O2.  2. COPD exacerbation management as indicated above.   3. Tobacco use. Encouraged smoking cessation.  Indicated that he Will not smoke after discharge.  4. Hypertension, with now hypotension. Hypotension resolved, likely from dehydration.   5. DM type 2 (diabetes mellitus, type 2). Continue home insulin regimen. Uncontrolled due to steroids.   6. Sleep apnea. Continue CPAP.   7. ARF (acute renal failure). Improved likely from dehydration.   8. Tachycardia. Likely due to neb treatment, resolved.  9. Hypokalemia. Resolved.  10. Prophylaxis. Lovenox for DVT prophylaxis.   11. Code status. Full code.  12. Disposition. Discharge today.   LOS: 2 days  Marisue Canion A, MD 09/22/2011, 10:21 AM

## 2011-09-22 NOTE — Discharge Summary (Signed)
Discharge Summary  [name] MR#: [id number]  DOB:1951-10-11  Date of Admission: 09/20/2011 Date of Discharge: 09/22/2011  Patient's PCP: Keith Grosser, MD, MD  Attending Physician:[name] A  Consults: None  Discharge Diagnoses: Principal Problem:  *Acute respiratory failure with hypoxia Active Problems:  CIGARETTE SMOKER  Hypertension  DM type 2 (diabetes mellitus, type 2)  Sleep apnea  COPD exacerbation  ARF (acute renal failure)  Tachycardia  Hypokalemia  Hypotension  Brief Admitting History and Physical "[name] is a 60 year old Caucasian male, with a history of hypertension, COPD, type 2 diabetes, obstructive sleep apnea, GERD, thyroid disease, depression, coronary artery disease, and tobacco abuse who presents with the above complaints. Patient reports that his symptoms started about 2 - 3 weeks ago, with shortness of breath. He noted that during the same period time he has had increasing shortness of breath with cough productive of greenish sputum. He had presented to his gastroenterologist yesterday for a regularly scheduled colonoscopy. He was found to be hypoxic yesterday but a colonoscopy was still done because the patient had taken bowel prep. He continued to be short of breath with cough productive sputum as a result presented to his primary care physician's office today. He was given a neb treatment and was then transferred to the emergency department for further care and evaluation. In the emergency department the patient was started on steroids and neb treatments which improved his breathing. Given that he was still short of breath the hospitalist service was asked to admit the patient for further care and management. Patient denies any fevers. Denies any chills. Feels diaphoretic at this time. Feels like his shortness of breath is improved after neb treatment. Does complain of chest pain but associates that with the cough. Denies any abdominal pain  but does complain of cramps. Attributes the cramps to the colon prep that he had yesterday. Denies any headaches or vision changes."   Discharge Medications Current Discharge Medication List    START taking these medications   Details  albuterol (PROVENTIL) (5 MG/ML) 0.5% nebulizer solution Take 0.5 mLs (2.5 mg total) by nebulization every 2 (two) hours as needed for wheezing. Qty: 20 mL, Refills: 0    Fluticasone-Salmeterol (ADVAIR) 250-50 MCG/DOSE AEPB Inhale 1 puff into the lungs 2 (two) times daily. Qty: 60 each, Refills: 0    ipratropium (ATROVENT) 0.02 % nebulizer solution Take 2.5 mLs (0.5 mg total) by nebulization every 6 (six) hours. Qty: 75 mL, Refills: 0    levofloxacin (LEVAQUIN) 500 MG tablet Take 1 tablet (500 mg total) by mouth daily. Qty: 5 tablet, Refills: 0    nicotine (NICODERM CQ - DOSED IN MG/24 HOURS) 14 mg/24hr patch Place 1 patch onto the skin daily. Don't smoke while on the patch. Qty: 28 patch, Refills: 0    predniSONE (DELTASONE) 10 MG tablet Take 1 tablet (10 mg total) by mouth daily before breakfast. Take 40 mg po qday x1 day then, Take 20 mg po qday x2 days then, Take 10 mg po qday x2 days then, Take 5 mg po qday x2 days then discontinue. Qty: 11 tablet, Refills: 0      CONTINUE these medications which have CHANGED   Details  insulin glargine (LANTUS) 100 UNIT/ML injection Inject 90 Units into the skin daily. Please take lantus at 100 units until 09/26/2011. Then resume back to home dose. Qty: 10 mL      CONTINUE these medications which have NOT CHANGED   Details  acetaminophen (TYLENOL) 500 MG tablet  Take 500 mg by mouth every 6 (six) hours as needed.      albuterol (PROVENTIL) (2.5 MG/3ML) 0.083% nebulizer solution Take 2.5 mg by nebulization every 6 (six) hours as needed.      aspirin 81 MG tablet Take 81 mg by mouth daily.      calcium carbonate (OS-CAL - DOSED IN MG OF ELEMENTAL CALCIUM) 1250 MG tablet Take 1 tablet by mouth 4 (four)  times daily.      ezetimibe-simvastatin (VYTORIN) 10-80 MG per tablet Take 1 tablet by mouth at bedtime.      FLUoxetine (PROZAC) 40 MG capsule Take 40 mg by mouth daily.      ibuprofen (ADVIL,MOTRIN) 100 MG tablet Take 100 mg by mouth every 6 (six) hours as needed.      insulin aspart (NOVOLOG) 100 UNIT/ML injection Inject 22 Units into the skin 3 (three) times daily before meals.      LORazepam (ATIVAN) 0.5 MG tablet Take 0.5 mg by mouth daily.     nebivolol (BYSTOLIC) 5 MG tablet Take 5 mg by mouth daily.      omeprazole (PRILOSEC) 20 MG capsule Take 20 mg by mouth daily.      rosuvastatin (CRESTOR) 40 MG tablet Take 40 mg by mouth daily.      tadalafil (CIALIS) 10 MG tablet Take 100 mg by mouth daily as needed.      tiotropium (SPIRIVA) 18 MCG inhalation capsule Place 18 mcg into inhaler and inhale daily.      traZODone (DESYREL) 100 MG tablet Take 100 mg by mouth at bedtime.          Hospital Course: Acute respiratory failure with hypoxia Present on Admission:  .COPD exacerbation .Acute respiratory failure with hypoxia .Hypertension .DM type 2 (diabetes mellitus, type 2) .CIGARETTE SMOKER .Sleep apnea .ARF (acute renal failure) .Tachycardia .Hypokalemia .Hypotension   1. Acute respiratory failure with hypoxia. Due to COPD exacerbation. Improved during the course of the hospital stay. Patient was started on IV steroids which was transitioned to oral as his breathing improved. Continued the patient on neb treatment. Was started Levofloxacin due to cough productive sputum, Abx since 09/20/2011. Will be on antibiotics for 5 more days to complete 7 day course of antibiotics. Walking desaturation 09/21/2011 indicated the patient is hypoxic on room air and qualified for home O2.  2. COPD exacerbation management as indicated above.   3. Tobacco use. Encouraged smoking cessation.  Indicated that he Will not smoke after discharge.  4. Hypertension. Continued home meds.  5.  Hypotension. Inially hypotensive, resolved with IV fluids. Likely dehydration due to #1.  5. DM type 2 (diabetes mellitus, type 2). Continue home insulin regimen. Uncontrolled due to steroids. Instructed the patient increase his Lantus to 100 units for the next 4 days then resume home dose of 90 units. If any concerns then to call Dr. Daune Perch office his endocrinologist.  6. Sleep apnea. Continued CPAP.   7. ARF (acute renal failure). Improved likely from dehydration, initial creatinine on presentation was 1.56 which resolved to 0.97 with IV hydration..   8. Tachycardia. Likely due to neb treatment, resolved. Troponins negative x3. No events on tele.  9. Hypokalemia. Resolved with replacement.  Day of Discharge BP 145/77  Pulse 59  Temp(Src) 97.5 F (36.4 C) (Oral)  Resp 20  Ht 5\' 11"  (1.803 m)  Wt 130 kg (286 lb 9.6 oz)  BMI 39.97 kg/m2  SpO2 92%  Results for orders placed during the hospital encounter of 09/20/11 (  from the past 48 hour(s))  CBC     Status: Abnormal   Collection Time   09/20/11  4:17 PM      Component Value Range Comment   WBC 9.3  4.0 - 10.5 (K/uL)    RBC 3.63 (*) 4.22 - 5.81 (MIL/uL)    Hemoglobin 11.2 (*) 13.0 - 17.0 (g/dL)    HCT 16.1 (*) 09.6 - 52.0 (%)    MCV 91.5  78.0 - 100.0 (fL)    MCH 30.9  26.0 - 34.0 (pg)    MCHC 33.7  30.0 - 36.0 (g/dL)    RDW 04.5  40.9 - 81.1 (%)    Platelets 223  150 - 400 (K/uL)   CREATININE, SERUM     Status: Abnormal   Collection Time   09/20/11  4:17 PM      Component Value Range Comment   Creatinine, Ser 1.69 (*) 0.50 - 1.35 (mg/dL)    GFR calc non Af Amer 42 (*) >90 (mL/min)    GFR calc Af Amer 49 (*) >90 (mL/min)   CARDIAC PANEL(CRET KIN+CKTOT+MB+TROPI)     Status: Abnormal   Collection Time   09/20/11  4:17 PM      Component Value Range Comment   Total CK 769 (*) 7 - 232 (U/L)    CK, MB 7.3 (*) 0.3 - 4.0 (ng/mL)    Troponin I <0.30  <0.30 (ng/mL)    Relative Index 0.9  0.0 - 2.5    GLUCOSE, CAPILLARY      Status: Abnormal   Collection Time   09/20/11  4:22 PM      Component Value Range Comment   Glucose-Capillary 451 (*) 70 - 99 (mg/dL)   CARDIAC PANEL(CRET KIN+CKTOT+MB+TROPI)     Status: Abnormal   Collection Time   09/20/11  5:31 PM      Component Value Range Comment   Total CK 765 (*) 7 - 232 (U/L)    CK, MB 7.8 (*) 0.3 - 4.0 (ng/mL) CRITICAL VALUE NOTED.  VALUE IS CONSISTENT WITH PREVIOUSLY REPORTED AND CALLED VALUE.   Troponin I <0.30  <0.30 (ng/mL)    Relative Index 1.0  0.0 - 2.5    GLUCOSE, RANDOM     Status: Abnormal   Collection Time   09/20/11  5:43 PM      Component Value Range Comment   Glucose, Bld 451 (*) 70 - 99 (mg/dL)   GLUCOSE, CAPILLARY     Status: Abnormal   Collection Time   09/20/11  8:52 PM      Component Value Range Comment   Glucose-Capillary 374 (*) 70 - 99 (mg/dL)    Comment 1 Notify RN     CARDIAC PANEL(CRET KIN+CKTOT+MB+TROPI)     Status: Abnormal   Collection Time   09/20/11 11:39 PM      Component Value Range Comment   Total CK 716 (*) 7 - 232 (U/L)    CK, MB 9.6 (*) 0.3 - 4.0 (ng/mL) CRITICAL VALUE NOTED.  VALUE IS CONSISTENT WITH PREVIOUSLY REPORTED AND CALLED VALUE.   Troponin I <0.30  <0.30 (ng/mL)    Relative Index 1.3  0.0 - 2.5    BASIC METABOLIC PANEL     Status: Abnormal   Collection Time   09/21/11  5:05 AM      Component Value Range Comment   Sodium 133 (*) 135 - 145 (mEq/L)    Potassium 4.1  3.5 - 5.1 (mEq/L)    Chloride 96  96 -  112 (mEq/L)    CO2 23  19 - 32 (mEq/L)    Glucose, Bld 362 (*) 70 - 99 (mg/dL)    BUN 38 (*) 6 - 23 (mg/dL)    Creatinine, Ser 1.61  0.50 - 1.35 (mg/dL)    Calcium 8.5  8.4 - 10.5 (mg/dL)    GFR calc non Af Amer 68 (*) >90 (mL/min)    GFR calc Af Amer 78 (*) >90 (mL/min)   CBC     Status: Abnormal   Collection Time   09/21/11  5:05 AM      Component Value Range Comment   WBC 7.5  4.0 - 10.5 (K/uL)    RBC 3.67 (*) 4.22 - 5.81 (MIL/uL)    Hemoglobin 11.3 (*) 13.0 - 17.0 (g/dL)    HCT 09.6 (*) 04.5  - 52.0 (%)    MCV 91.0  78.0 - 100.0 (fL)    MCH 30.8  26.0 - 34.0 (pg)    MCHC 33.8  30.0 - 36.0 (g/dL)    RDW 40.9  81.1 - 91.4 (%)    Platelets 223  150 - 400 (K/uL)   MAGNESIUM     Status: Abnormal   Collection Time   09/21/11  5:05 AM      Component Value Range Comment   Magnesium 1.3 (*) 1.5 - 2.5 (mg/dL)   GLUCOSE, CAPILLARY     Status: Abnormal   Collection Time   09/21/11  5:48 AM      Component Value Range Comment   Glucose-Capillary 380 (*) 70 - 99 (mg/dL)    Comment 1 Notify RN     GLUCOSE, CAPILLARY     Status: Abnormal   Collection Time   09/21/11 11:16 AM      Component Value Range Comment   Glucose-Capillary 362 (*) 70 - 99 (mg/dL)    Comment 1 Notify RN      Comment 2 Documented in Chart     GLUCOSE, CAPILLARY     Status: Abnormal   Collection Time   09/21/11  4:20 PM      Component Value Range Comment   Glucose-Capillary 300 (*) 70 - 99 (mg/dL)    Comment 1 Notify RN      Comment 2 Documented in Chart     GLUCOSE, CAPILLARY     Status: Abnormal   Collection Time   09/21/11  9:13 PM      Component Value Range Comment   Glucose-Capillary 183 (*) 70 - 99 (mg/dL)    Comment 1 Notify RN     GLUCOSE, CAPILLARY     Status: Abnormal   Collection Time   09/22/11  6:19 AM      Component Value Range Comment   Glucose-Capillary 222 (*) 70 - 99 (mg/dL)   BASIC METABOLIC PANEL     Status: Abnormal   Collection Time   09/22/11  6:40 AM      Component Value Range Comment   Sodium 136  135 - 145 (mEq/L)    Potassium 4.1  3.5 - 5.1 (mEq/L)    Chloride 100  96 - 112 (mEq/L)    CO2 25  19 - 32 (mEq/L)    Glucose, Bld 217 (*) 70 - 99 (mg/dL)    BUN 31 (*) 6 - 23 (mg/dL)    Creatinine, Ser 7.82  0.50 - 1.35 (mg/dL)    Calcium 8.5  8.4 - 10.5 (mg/dL)    GFR calc non Af Amer 87 (*) >90 (mL/min)  GFR calc Af Amer >90  >90 (mL/min)     Dg Chest 2 View  09/20/2011  *RADIOLOGY REPORT*  Clinical Data: Increasing shortness of breath, hypertension, COPD, diabetes   CHEST - 2 VIEW  Comparison: Chest x-ray of 11/16/2010  Findings: There is cardiomegaly present.  There are prominent markings throughout the lungs some which are chronic.  However pneumonia particularly in the right upper lobe abutting the fissure on the frontal view is a definite consideration.  Also mild pulmonary vascular congestion cannot be excluded.  There is some peribronchial thickening which may indicate bronchitis.  No acute bony abnormality is seen.  IMPRESSION:  1.  Mild cardiomegaly with question mild pulmonary vascular congestion.  Probable chronic interstitial change as well. 2.  Cannot exclude pneumonia abutting the fissure anteriorly in the right upper lobe.  Original Report Authenticated By: Juline Patch, M.D.     Disposition: DC home with home O2.  Diet: Diabetic Diet.  Activity: Resume as tolerated.   Follow-up Appts: Discharge Orders    Future Orders Please Complete By Expires   Diet Carb Modified      Increase activity slowly      Discharge instructions      Comments:   Followup with [facility] TOM, MD (PCP) in 1 week. Followup with Dr. Sharl Ma (Endocrinology) as previously scheduled. Followup with Dr. Marchelle Gearing (Pulmonary) in 2-3 weeks.      Time spent on discharge, talking to the patient, and coordinating care: 35 mins.   Signed: Cristal Ford, MD 09/22/2011, 10:24 AM

## 2011-09-22 NOTE — Progress Notes (Signed)
Patient interviewed. States he has oxygen with [employer]. Currently pt needs a portable tank to go home from hospital. CM contacted [employer] for portable oxygen for home to be delivered to his room #[id number] (reference # [id number]).

## 2011-09-24 NOTE — Progress Notes (Signed)
09/24/2011 [facility] Case Management Note [phone]       Utilization review completed.  

## 2011-10-16 ENCOUNTER — Encounter: Payer: Self-pay | Admitting: Internal Medicine

## 2011-10-16 ENCOUNTER — Ambulatory Visit (INDEPENDENT_AMBULATORY_CARE_PROVIDER_SITE_OTHER): Payer: Managed Care, Other (non HMO) | Admitting: Internal Medicine

## 2011-10-16 DIAGNOSIS — F17200 Nicotine dependence, unspecified, uncomplicated: Secondary | ICD-10-CM

## 2011-10-16 DIAGNOSIS — J4489 Other specified chronic obstructive pulmonary disease: Secondary | ICD-10-CM

## 2011-10-16 DIAGNOSIS — J449 Chronic obstructive pulmonary disease, unspecified: Secondary | ICD-10-CM

## 2011-10-16 DIAGNOSIS — D360 Benign neoplasm of lymph nodes: Secondary | ICD-10-CM

## 2011-10-16 DIAGNOSIS — J180 Bronchopneumonia, unspecified organism: Secondary | ICD-10-CM

## 2011-10-16 DIAGNOSIS — J984 Other disorders of lung: Secondary | ICD-10-CM

## 2011-10-16 MED ORDER — PREDNISONE 10 MG PO TABS: 10.0000 mg | ORAL_TABLET | Freq: Every day | ORAL | Status: DC

## 2011-10-16 MED ORDER — DOXYCYCLINE HYCLATE 100 MG PO TABS: 100.0000 mg | ORAL_TABLET | Freq: Two times a day (BID) | ORAL | Status: AC

## 2011-10-16 NOTE — Progress Notes (Signed)
  Subjective:    Patient ID: [name], male    DOB: [birth year], 60 y.o.   MRN: [id number]  HPI Problem List 1. tobacco abuse (wellbutrin intolerance hx - insomnia in early 2011, chantix intolerance )  - quit Nov 2012 after admission for pna 2. 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 Mediastinal nodes Stable Oct 2009 - July 2011; no further fu 4.RUL pulmonary nodule  - 5mm new sept 2012  - unchanged july2011 5. Obesity/osa on cpap and cough  6. RLL pulmonary scarring  Stable CT 2008 -> 2011; no further fu 7. CAD - s/p stent  OV 10/16/2011 Not seen since August 2011. Admitted November 15., 2012 throgh 09/22/2011 for Rt sided pneumonia (independently reviewd cxr). Was hypoxemic and discharged on o2 and total 7 day levaquin and prednisone. Feels 70% back to baseline but still with some cough, green sputum and dyspnea. Feels antibiotic and pred course was not long enough otherwise would be better than today. No new complaints. Has quit smoking since hospitalization. Walked 185 feet x 3 laps: and lowest pulse ox was 91%. This is an improvement since dc  Past, social and family reviewed: no change         Review of Systems  Constitutional: Negative for fever and unexpected weight change.  HENT: Negative for ear pain, nosebleeds, congestion, sore throat, rhinorrhea, sneezing, trouble swallowing, dental problem, postnasal drip and sinus pressure.   Eyes: Negative for redness and itching.  Respiratory: Positive for cough. Negative for chest tightness, shortness of breath and wheezing.   Cardiovascular: Negative for palpitations and leg swelling.  Gastrointestinal: Negative for nausea and vomiting.  Genitourinary: Negative for dysuria.  Musculoskeletal: Negative for joint swelling.  Skin: Negative for rash.  Neurological: Positive for light-headedness and headaches.  Hematological: Does not bruise/bleed easily.  Psychiatric/Behavioral: Negative  for dysphoric mood. The patient is not nervous/anxious.        Objective:   Physical Exam  Physical Exam  General:  well developed, well nourished, in no acute distressobese.  tobacco smell + Head:  normocephalic and atraumatic Eyes:  PERRLA/EOM intact; conjunctiva and sclera clear Ears:  TMs intact and clear with normal canals Nose:  no deformity, discharge, inflammation, or lesions Mouth:  no deformity or lesions Neck:  no masses, thyromegaly, or abnormal cervical nodes Chest Wall:  no deformities noted Lungs:  decreased BS bilateral and prolonged exhilation.  - one or two scattered wheeze + Heart:  regular rate and rhythm, S1, S2 without murmurs, rubs, gallops, or clicks Abdomen:  bowel sounds positive; abdomen soft and non-tender without masses, or organomegaly   Msk:  no deformity or scoliosis noted with normal posture Pulses:  pulses normal Extremities:  no clubbing, cyanosis, edema, or deformity noted Neurologic:  CN II-XII grossly intact with normal reflexes, coordination, muscle strength and tone Skin:  intact without lesions or rashes Cervical Nodes:  no significant adenopathy Axillary Nodes:  no significant adenopathy Psych:  alert and cooperative; normal mood and affect; normal attention span and concentration      Assessment & Plan:

## 2011-10-16 NOTE — Assessment & Plan Note (Signed)
Quit smoking nov 2012 after admission. Encouraged to stay in remission

## 2011-10-16 NOTE — Assessment & Plan Note (Signed)
Though haas finisihed abx and pred following pna, appears to have residual mild aecopd. So will repeat pred burst and doxy

## 2011-10-16 NOTE — Patient Instructions (Addendum)
You have mild attack of copd called COPD exacerbation; this is after pneumonia Please take doxycycline 100mg  twice daily after meals x 5 days; avoid sunlight Please take prednisone 40mg  once daily x 3 days, then 20mg  once daily x 3 days, then 10mg  once daily x 3 days, then 5mg  once dailyx 3 days and stop Return in early feb 2013 with CT chest wihtout contrast for fu of pneumonia and lung nodule from 2011

## 2011-10-16 NOTE — Assessment & Plan Note (Signed)
Had RUL 5mm nodule in July 2011. Will repeat CT chest in feb 2013

## 2011-10-16 NOTE — Assessment & Plan Note (Signed)
Clinically improved. No longer desaturating.  Still with some cough and sputum  Plan Repeat imaging to document clearance in early feb 2013

## 2011-11-13 ENCOUNTER — Encounter (HOSPITAL_COMMUNITY): Payer: Self-pay

## 2011-11-13 ENCOUNTER — Emergency Department (HOSPITAL_COMMUNITY): Payer: Managed Care, Other (non HMO)

## 2011-11-13 ENCOUNTER — Emergency Department (HOSPITAL_COMMUNITY)
Admission: EM | Admit: 2011-11-13 | Discharge: 2011-11-13 | Disposition: A | Discharge status: Home / Self Care | Payer: Managed Care, Other (non HMO) | Attending: Emergency Medicine | Admitting: Emergency Medicine

## 2011-11-13 DIAGNOSIS — R05 Cough: Secondary | ICD-10-CM | POA: Insufficient documentation

## 2011-11-13 DIAGNOSIS — R109 Unspecified abdominal pain: Secondary | ICD-10-CM | POA: Insufficient documentation

## 2011-11-13 DIAGNOSIS — Z794 Long term (current) use of insulin: Secondary | ICD-10-CM | POA: Insufficient documentation

## 2011-11-13 DIAGNOSIS — F329 Major depressive disorder, single episode, unspecified: Secondary | ICD-10-CM | POA: Insufficient documentation

## 2011-11-13 DIAGNOSIS — F1010 Alcohol abuse, uncomplicated: Secondary | ICD-10-CM | POA: Insufficient documentation

## 2011-11-13 DIAGNOSIS — E119 Type 2 diabetes mellitus without complications: Secondary | ICD-10-CM | POA: Insufficient documentation

## 2011-11-13 DIAGNOSIS — K219 Gastro-esophageal reflux disease without esophagitis: Secondary | ICD-10-CM | POA: Insufficient documentation

## 2011-11-13 DIAGNOSIS — R0602 Shortness of breath: Secondary | ICD-10-CM | POA: Insufficient documentation

## 2011-11-13 DIAGNOSIS — E079 Disorder of thyroid, unspecified: Secondary | ICD-10-CM | POA: Insufficient documentation

## 2011-11-13 DIAGNOSIS — F10929 Alcohol use, unspecified with intoxication, unspecified: Secondary | ICD-10-CM

## 2011-11-13 DIAGNOSIS — I2510 Atherosclerotic heart disease of native coronary artery without angina pectoris: Secondary | ICD-10-CM | POA: Insufficient documentation

## 2011-11-13 DIAGNOSIS — R10816 Epigastric abdominal tenderness: Secondary | ICD-10-CM | POA: Insufficient documentation

## 2011-11-13 DIAGNOSIS — Z7982 Long term (current) use of aspirin: Secondary | ICD-10-CM | POA: Insufficient documentation

## 2011-11-13 DIAGNOSIS — I10 Essential (primary) hypertension: Secondary | ICD-10-CM | POA: Insufficient documentation

## 2011-11-13 DIAGNOSIS — I252 Old myocardial infarction: Secondary | ICD-10-CM | POA: Insufficient documentation

## 2011-11-13 DIAGNOSIS — F3289 Other specified depressive episodes: Secondary | ICD-10-CM | POA: Insufficient documentation

## 2011-11-13 DIAGNOSIS — Z9889 Other specified postprocedural states: Secondary | ICD-10-CM | POA: Insufficient documentation

## 2011-11-13 DIAGNOSIS — R059 Cough, unspecified: Secondary | ICD-10-CM | POA: Insufficient documentation

## 2011-11-13 DIAGNOSIS — R071 Chest pain on breathing: Secondary | ICD-10-CM | POA: Insufficient documentation

## 2011-11-13 DIAGNOSIS — Z79899 Other long term (current) drug therapy: Secondary | ICD-10-CM | POA: Insufficient documentation

## 2011-11-13 DIAGNOSIS — J441 Chronic obstructive pulmonary disease with (acute) exacerbation: Secondary | ICD-10-CM | POA: Insufficient documentation

## 2011-11-13 DIAGNOSIS — R000 Tachycardia, unspecified: Secondary | ICD-10-CM | POA: Insufficient documentation

## 2011-11-13 HISTORY — DX: Acute myocardial infarction, unspecified: I21.9

## 2011-11-13 LAB — COMPREHENSIVE METABOLIC PANEL
ALT: 20 U/L (ref 0–53)
Alkaline Phosphatase: 61 U/L (ref 39–117)
CO2: 22 mEq/L (ref 19–32)
GFR calc Af Amer: >90 mL/min (ref >90)
GFR calc non Af Amer: 87 mL/min — ABNORMAL LOW (ref >90)
Glucose, Bld: 302 mg/dL — ABNORMAL HIGH (ref 70–99)
Potassium: 3.5 mEq/L (ref 3.5–5.1)
Sodium: 137 mEq/L (ref 135–145)
Total Bilirubin: 0.2 mg/dL — ABNORMAL LOW (ref 0.3–1.2)

## 2011-11-13 LAB — POCT I-STAT TROPONIN I: Troponin i, poc: 0.01 ng/mL (ref 0.00–0.08)

## 2011-11-13 LAB — RAPID URINE DRUG SCREEN, HOSP PERFORMED
Amphetamines: NOT DETECTED
Opiates: NOT DETECTED
Tetrahydrocannabinol: NOT DETECTED

## 2011-11-13 LAB — URINALYSIS, ROUTINE W REFLEX MICROSCOPIC
Bilirubin Urine: NEGATIVE
Ketones, ur: 40 mg/dL — AB
Nitrite: NEGATIVE
Urobilinogen, UA: 0.2 mg/dL (ref 0.0–1.0)

## 2011-11-13 LAB — CBC
HCT: 36.3 % — ABNORMAL LOW (ref 39.0–52.0)
MCV: 87.7 fL (ref 78.0–100.0)
RDW: 13.5 % (ref 11.5–15.5)
WBC: 9.2 10*3/uL (ref 4.0–10.5)

## 2011-11-13 LAB — DIFFERENTIAL
Basophils Absolute: 0 10*3/uL (ref 0.0–0.1)
Eosinophils Relative: 2 % (ref 0–5)
Lymphocytes Relative: 26 % (ref 12–46)
Lymphs Abs: 2.4 10*3/uL (ref 0.7–4.0)
Monocytes Absolute: 0.5 10*3/uL (ref 0.1–1.0)

## 2011-11-13 MED ORDER — IOHEXOL 300 MG/ML  SOLN
100.0000 mL | Freq: Once | INTRAMUSCULAR | Status: AC | PRN
  Administered 2011-11-13: 100 mL via INTRAVENOUS

## 2011-11-13 MED ORDER — SODIUM CHLORIDE 0.9 % IV SOLN
Freq: Once | INTRAVENOUS | Status: AC
  Administered 2011-11-13: 19:00:00 via INTRAVENOUS

## 2011-11-13 MED ORDER — SODIUM CHLORIDE 0.9 % IV BOLUS (SEPSIS)
1000.0000 mL | Freq: Once | INTRAVENOUS | Status: AC
  Administered 2011-11-13: 1000 mL via INTRAVENOUS

## 2011-11-13 MED ORDER — FOLIC ACID 1 MG PO TABS
1.0000 mg | ORAL_TABLET | Freq: Once | ORAL | Status: AC
  Administered 2011-11-13: 1 mg via ORAL
  Filled 2011-11-13: qty 1

## 2011-11-13 MED ORDER — ALBUTEROL SULFATE (5 MG/ML) 0.5% IN NEBU
5.0000 mg | INHALATION_SOLUTION | Freq: Once | RESPIRATORY_TRACT | Status: AC
  Administered 2011-11-13: 5 mg via RESPIRATORY_TRACT
  Filled 2011-11-13: qty 1

## 2011-11-13 MED ORDER — SODIUM CHLORIDE 0.9 % IV SOLN
INTRAVENOUS | Status: DC
  Administered 2011-11-13: 15:00:00 via INTRAVENOUS

## 2011-11-13 MED ORDER — THIAMINE HCL 100 MG/ML IJ SOLN
100.0000 mg | Freq: Once | INTRAMUSCULAR | Status: AC
  Administered 2011-11-13: 100 mg via INTRAVENOUS
  Filled 2011-11-13: qty 2

## 2011-11-13 MED ORDER — FENTANYL CITRATE 0.05 MG/ML IJ SOLN
50.0000 ug | Freq: Once | INTRAMUSCULAR | Status: AC
  Administered 2011-11-13: 50 ug via INTRAVENOUS

## 2011-11-13 MED ORDER — LORAZEPAM 1 MG PO TABS: 2.0000 mg | ORAL_TABLET | Freq: Three times a day (TID) | ORAL | Status: AC | PRN

## 2011-11-13 MED ORDER — IPRATROPIUM BROMIDE 0.02 % IN SOLN
0.5000 mg | Freq: Once | RESPIRATORY_TRACT | Status: AC
  Administered 2011-11-13: 0.5 mg via RESPIRATORY_TRACT
  Filled 2011-11-13: qty 2.5

## 2011-11-13 MED ORDER — FENTANYL CITRATE 0.05 MG/ML IJ SOLN
INTRAMUSCULAR | Status: AC
  Filled 2011-11-13: qty 2

## 2011-11-13 MED ORDER — METHYLPREDNISOLONE SODIUM SUCC 125 MG IJ SOLR
125.0000 mg | Freq: Once | INTRAMUSCULAR | Status: AC
  Administered 2011-11-13: 125 mg via INTRAVENOUS
  Filled 2011-11-13: qty 2

## 2011-11-13 NOTE — ED Notes (Signed)
Pt states he drank 3 fifths of liquor today trying to "get rid of this pain."

## 2011-11-13 NOTE — ED Provider Notes (Signed)
Patient in CDU to complete IV fluids and normalization of EtOH levels.  Repeat labs reveals EtOH level less than 11 mg/dl.  Patient expressing desire to obtain help with alcohol addiction. Referral information provided.  Patient also given a prescription for lorazepam to assist with potential withdrawal symptoms.  Patient to follow-up with his PCP.  Jimmye Norman, NP 11/13/11 [id number]

## 2011-11-13 NOTE — ED Notes (Signed)
Ambulated Pt. Pt is able to ambulate normally and without assistance.

## 2011-11-13 NOTE — ED Notes (Signed)
Patient transported to CT 

## 2011-11-13 NOTE — ED Notes (Signed)
Patient returned from CT
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
## 2011-11-13 NOTE — ED Provider Notes (Signed)
Medical screening examination/treatment/procedure(s) were conducted as a shared visit with non-physician practitioner(s) and myself.  I personally evaluated the patient during the encounter   Yatzari Jonsson A. Patrica Duel, MD 11/13/11 [id number]

## 2011-11-13 NOTE — ED Notes (Signed)
ETOH level ordered as per Felicie Morn, NP.  Also ordered meal tray.
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
## 2011-12-12 ENCOUNTER — Telehealth: Payer: Self-pay | Admitting: Internal Medicine

## 2011-12-12 NOTE — Telephone Encounter (Signed)
Pt's wife states pt had a ct chest[facility] month he has one scheduled for 12/17/11 does he still need that ct?
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
## 2011-12-12 NOTE — Telephone Encounter (Signed)
Error no message needed.

## 2011-12-13 NOTE — Telephone Encounter (Signed)
Pt aware. Jennifer Castillo, CMA  

## 2011-12-17 ENCOUNTER — Ambulatory Visit (INDEPENDENT_AMBULATORY_CARE_PROVIDER_SITE_OTHER)
Admission: RE | Admit: 2011-12-17 | Discharge: 2011-12-17 | Disposition: A | Discharge status: Home / Self Care | Payer: Managed Care, Other (non HMO) | Source: Ambulatory Visit | Attending: Internal Medicine | Admitting: Internal Medicine

## 2011-12-17 DIAGNOSIS — J984 Other disorders of lung: Secondary | ICD-10-CM

## 2011-12-17 DIAGNOSIS — J180 Bronchopneumonia, unspecified organism: Secondary | ICD-10-CM

## 2011-12-18 ENCOUNTER — Telehealth: Payer: Self-pay | Admitting: Internal Medicine

## 2011-12-18 NOTE — Telephone Encounter (Signed)
CT report given.   Please give him appt to see me sooner than march 2013  Thanks  MR  Ct Chest Wo Contrast  12/17/2011  *RADIOLOGY REPORT*  Clinical Data: Lung nodule.  Reevaluate pneumonia.  CT CHEST WITHOUT CONTRAST   IMPRESSION:  1. Two right upper lobe nodules, one of which is cavitary, are new from prior exams and highly worrisome for primary bronchogenic carcinoma. 2. Scattered tiny pulmonary nodules, most of which are stable from prior exams.  A tiny left lower lobe nodule is not well seen on prior studies.  Continued attention on follow-up exams is warranted. 3.  Atherosclerotic calcification of the arterial vasculature, including extensive involvement of the coronary arteries.  Original Report Authenticated By: Reyes Ivan, M.D.

## 2011-12-20 NOTE — Telephone Encounter (Signed)
Pt scheduled to come see MR 12/21/11 at 1:45pm and March appointment canceled.

## 2011-12-21 ENCOUNTER — Encounter: Payer: Self-pay | Admitting: Internal Medicine

## 2011-12-21 ENCOUNTER — Ambulatory Visit (INDEPENDENT_AMBULATORY_CARE_PROVIDER_SITE_OTHER): Payer: Managed Care, Other (non HMO) | Admitting: Internal Medicine

## 2011-12-21 VITALS — BP 122/70 | HR 89 | Temp 97.8°F | Ht 71.0 in | Wt 304.0 lb

## 2011-12-21 DIAGNOSIS — J449 Chronic obstructive pulmonary disease, unspecified: Secondary | ICD-10-CM

## 2011-12-21 DIAGNOSIS — R911 Solitary pulmonary nodule: Secondary | ICD-10-CM

## 2011-12-21 DIAGNOSIS — R0789 Other chest pain: Secondary | ICD-10-CM

## 2011-12-21 DIAGNOSIS — F17200 Nicotine dependence, unspecified, uncomplicated: Secondary | ICD-10-CM

## 2011-12-21 DIAGNOSIS — J984 Other disorders of lung: Secondary | ICD-10-CM
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
## 2011-12-21 NOTE — Assessment & Plan Note (Signed)
Advised to quit. He is struggling. Frequent relapses
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
## 2011-12-21 NOTE — Assessment & Plan Note (Signed)
CT feb 2013 has new RUL cavitary nodule 1.4cm with satellite 9mm  Plan Pet scan and followup Discussed if high pretest prob and lung function good, will need surgical resection. He is agreeable

## 2011-12-21 NOTE — Assessment & Plan Note (Signed)
Stable disease. Might need lobectomy if lesion in RUL is high pretest for cancer. So will check pft. Did not desat with walking 185 feet x 3 laps
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
## 2012-01-02 ENCOUNTER — Ambulatory Visit (HOSPITAL_COMMUNITY)
Admission: RE | Admit: 2012-01-02 | Discharge: 2012-01-02 | Disposition: A | Discharge status: Home / Self Care | Payer: Managed Care, Other (non HMO) | Source: Ambulatory Visit | Attending: Internal Medicine | Admitting: Internal Medicine

## 2012-01-02 ENCOUNTER — Encounter (HOSPITAL_COMMUNITY)
Admission: RE | Admit: 2012-01-02 | Discharge: 2012-01-02 | Disposition: A | Discharge status: Home / Self Care | Payer: Managed Care, Other (non HMO) | Source: Ambulatory Visit | Attending: Internal Medicine | Admitting: Internal Medicine

## 2012-01-02 ENCOUNTER — Encounter (HOSPITAL_COMMUNITY): Payer: Self-pay

## 2012-01-02 DIAGNOSIS — J4489 Other specified chronic obstructive pulmonary disease: Secondary | ICD-10-CM | POA: Insufficient documentation

## 2012-01-02 DIAGNOSIS — R911 Solitary pulmonary nodule: Secondary | ICD-10-CM

## 2012-01-02 DIAGNOSIS — G4733 Obstructive sleep apnea (adult) (pediatric): Secondary | ICD-10-CM | POA: Insufficient documentation

## 2012-01-02 DIAGNOSIS — J449 Chronic obstructive pulmonary disease, unspecified: Secondary | ICD-10-CM

## 2012-01-02 DIAGNOSIS — R918 Other nonspecific abnormal finding of lung field: Secondary | ICD-10-CM | POA: Insufficient documentation

## 2012-01-02 LAB — PULMONARY FUNCTION TEST

## 2012-01-02 LAB — GLUCOSE, CAPILLARY: Glucose-Capillary: 276 mg/dL — ABNORMAL HIGH (ref 70–99)

## 2012-01-02 MED ORDER — ALBUTEROL SULFATE (5 MG/ML) 0.5% IN NEBU
2.5000 mg | INHALATION_SOLUTION | Freq: Once | RESPIRATORY_TRACT | Status: AC
  Administered 2012-01-02: 2.5 mg via RESPIRATORY_TRACT

## 2012-01-02 MED ORDER — FLUDEOXYGLUCOSE F - 18 (FDG) INJECTION
16.2000 | Freq: Once | INTRAVENOUS | Status: AC | PRN
  Administered 2012-01-02: 16.2 via INTRAVENOUS

## 2012-01-09 ENCOUNTER — Telehealth: Payer: Self-pay | Admitting: Internal Medicine

## 2012-01-09 NOTE — Telephone Encounter (Signed)
lmomtcb x 2. Result per MR are below.    Candise Bowens  Let him know that the PET scan activity of the spot in lung is not convincing that is cancer but cannot be ruled out either. Please have him do oncimmune blood test and return in 2-3 weeks after oncimmune to discuss. Come sooner if there are new problems

## 2012-01-11 NOTE — Telephone Encounter (Signed)
Called and spoke with pt and explained per MR--results of PET scan and that he will need to come in one morning next week for oncimmune blood test.  Pt is aware that jennifer will be here every morning next week and he will call before he comes.  Pt voiced his understanding and did not have any questions at this time.  He is aware that we will schedule him an appt with MR in 2-3 weeks to review blood results.

## 2012-01-11 NOTE — Telephone Encounter (Signed)
Pt is returning call to jennifer

## 2012-01-11 NOTE — Telephone Encounter (Signed)
Pt's wife is very upset that they have not gotten results back yet.  Wants a returned phone call from either a nurse or MR w/ the results before we go home for the weekend.  [name]

## 2012-01-14 ENCOUNTER — Other Ambulatory Visit: Payer: Managed Care, Other (non HMO)

## 2012-01-14 ENCOUNTER — Telehealth: Payer: Self-pay | Admitting: Internal Medicine

## 2012-01-14 NOTE — Telephone Encounter (Signed)
Error.[name] ° °

## 2012-01-15 ENCOUNTER — Telehealth: Payer: Self-pay | Admitting: Internal Medicine

## 2012-01-15 ENCOUNTER — Ambulatory Visit: Payer: Managed Care, Other (non HMO) | Admitting: Internal Medicine

## 2012-01-15 NOTE — Telephone Encounter (Signed)
Per Tammy Parrett, pt will need to go to the ER or be seen by PCP for this chest pain.    Called, spoke with pt's wife as pt was laying in bed with o2 on.  I informed her TP recs pt needs to go to the ER or be seen by PCP.  She states pt's PCP office closed at 5 pm, so she will try to get him to go to the ER.  Nothing further needed at this time.
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
## 2012-01-16 NOTE — Telephone Encounter (Signed)
[name] like he never went to ER. Pls find out what is going on?  [employer]

## 2012-01-18 ENCOUNTER — Telehealth: Payer: Self-pay | Admitting: Internal Medicine

## 2012-01-18 NOTE — Telephone Encounter (Signed)
LMTCBx1 to check on patient. Carron Curie, CMA

## 2012-01-18 NOTE — Telephone Encounter (Signed)
Spoke with pt's spouse. She states to let MR know that the reason that pt did not go to the ED was b/c he went to bed that night and since then the pain seems to be much improved, although it does flare up occ. She states that when pain comes back he just rests and this helps. I advised that pt keep planned followup with MR and call sooner or seek emergency care if needed. She verbalized understanding.

## 2012-01-18 NOTE — Telephone Encounter (Signed)
Spouse is coming with pt at f/u and will call if anything further is needed but requests "no call back at this time unless there is more to tell her".

## 2012-01-18 NOTE — Telephone Encounter (Signed)
Ok thanks 

## 2012-01-21 NOTE — Telephone Encounter (Signed)
See phone note from 01-18-12. Carron Curie, CMA

## 2012-01-22 ENCOUNTER — Telehealth: Payer: Self-pay | Admitting: Internal Medicine

## 2012-01-22 NOTE — Telephone Encounter (Signed)
Spoke to patient 01/18/12: Oncimmune 7 panel antigen test negative. GAve results. He will follow with me 01/31/12. Discussed nodule blood test study. He is agreeable. HE is expecting call from [employer]

## 2012-01-31 ENCOUNTER — Encounter: Payer: Self-pay | Admitting: Internal Medicine

## 2012-01-31 ENCOUNTER — Ambulatory Visit (INDEPENDENT_AMBULATORY_CARE_PROVIDER_SITE_OTHER): Payer: Managed Care, Other (non HMO) | Admitting: Internal Medicine

## 2012-01-31 VITALS — BP 128/72 | HR 93 | Temp 97.5°F | Ht 71.0 in | Wt 307.4 lb

## 2012-01-31 DIAGNOSIS — J441 Chronic obstructive pulmonary disease with (acute) exacerbation: Secondary | ICD-10-CM

## 2012-01-31 DIAGNOSIS — R911 Solitary pulmonary nodule: Secondary | ICD-10-CM

## 2012-01-31 DIAGNOSIS — R0789 Other chest pain: Secondary | ICD-10-CM

## 2012-01-31 DIAGNOSIS — J984 Other disorders of lung: Secondary | ICD-10-CM

## 2012-01-31 MED ORDER — PREDNISONE 10 MG PO TABS: ORAL_TABLET | ORAL | Status: DC

## 2012-01-31 MED ORDER — TRAMADOL HCL 50 MG PO TABS: 50.0000 mg | ORAL_TABLET | Freq: Four times a day (QID) | ORAL | Status: DC | PRN
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
## 2012-02-04 ENCOUNTER — Encounter: Payer: Self-pay | Admitting: Internal Medicine

## 2012-02-04 ENCOUNTER — Telehealth: Payer: Self-pay | Admitting: Internal Medicine

## 2012-02-04 NOTE — Assessment & Plan Note (Addendum)
RUL cavitary nodule and the29mm RUL nodule that is adajcent are still high intermediate prob for lung cancer. The PET scan and oncimmune antigen testing are inderminate. WE discussed options of serial followup  Vs ENB bx now v Surgical resection (PFTs currently not optimal). Each has its inherent limtiations. HE defers decision to me. Advised we repeat ct chest in 4-6 weeks and if nodule persistent undergo ENB Bx with the caveat that there is a chance for non-diagnostic result. HE and wife agree to plan  > 50% of this > 25 min visit spent in face to face counseling

## 2012-02-04 NOTE — Assessment & Plan Note (Addendum)
I cannot figure out this right sided atypical pain. Sounds very c/w Tietze. Will see if this resolves short course AECOPD Rx. Will also try tramadol

## 2012-02-04 NOTE — Telephone Encounter (Signed)
I spoke with spouse and she states it was her understanding that they could cancel pt's PFt scheduled for 02/05/12 since he just had one done in February. We cancelled apt and we rescheduled it to 02/14/12 at 3:30 just in case MR still wanted pt to have PFT done. Please advise MR, thanks

## 2012-02-04 NOTE — Assessment & Plan Note (Signed)
Possible  Plan Short course antibiotic and prednisone

## 2012-02-04 NOTE — Telephone Encounter (Signed)
The instruction I wrote in my last ov was not to get a new pft but to get hold of the old pft from the hospital. So, yes they are correct. He does not need another PFT. I now have his feb 2013 pft.

## 2012-02-05 NOTE — Telephone Encounter (Signed)
I spoke with spouse and she is aware of this. Nothing further was needed and rx has been sent

## 2012-02-28 ENCOUNTER — Encounter: Payer: Self-pay | Admitting: Internal Medicine

## 2012-02-28 ENCOUNTER — Ambulatory Visit (INDEPENDENT_AMBULATORY_CARE_PROVIDER_SITE_OTHER): Payer: Managed Care, Other (non HMO) | Admitting: Internal Medicine

## 2012-02-28 ENCOUNTER — Ambulatory Visit (INDEPENDENT_AMBULATORY_CARE_PROVIDER_SITE_OTHER)
Admission: RE | Admit: 2012-02-28 | Discharge: 2012-02-28 | Disposition: A | Discharge status: Home / Self Care | Payer: Managed Care, Other (non HMO) | Source: Ambulatory Visit | Attending: Internal Medicine | Admitting: Internal Medicine

## 2012-02-28 VITALS — BP 124/60 | HR 114 | Ht 71.0 in | Wt 298.8 lb

## 2012-02-28 DIAGNOSIS — F17200 Nicotine dependence, unspecified, uncomplicated: Secondary | ICD-10-CM

## 2012-02-28 DIAGNOSIS — R911 Solitary pulmonary nodule: Secondary | ICD-10-CM

## 2012-02-28 DIAGNOSIS — J449 Chronic obstructive pulmonary disease, unspecified: Secondary | ICD-10-CM

## 2012-02-28 DIAGNOSIS — J180 Bronchopneumonia, unspecified organism: Secondary | ICD-10-CM

## 2012-02-28 DIAGNOSIS — J984 Other disorders of lung: Secondary | ICD-10-CM

## 2012-02-28 NOTE — Assessment & Plan Note (Signed)
Scattered non specific lesions in RLL on CT 02/28/2012   Plan monitor

## 2012-02-28 NOTE — Progress Notes (Signed)
Subjective:    Patient ID: [name], male    DOB: 08-15-1951, 61 y.o.   MRN: [id number]  HPI HPIProblem List 1. tobacco abuse (wellbutrin intolerance hx - insomnia in early 2011, chantix intolerance )  - quit Nov 2012 after admission for pna - relapsed Jan/FEb 2013  2A. Copd nos  -bullous emphysema on CT and isolated low DLCO on PFT 2008; did not desaturate Jan and FEb 2013  -  PFT Feb 2013: 01/02/12: fvc 2.8L/57%, fev1 2.2L/59%, Ratio 78 (104%), 14% BD response, small airways 64%, TLC 95%, DLCO 18.7/55%  - spirometry March 2013: fev1 2.Marland Kitchen4L/63%, Ratio 93 - suggestive of restriction  2B. AECOPD - Nov 2012 admit for pneumonia  - Jan 2013 OPD Rx  - March 2013 - presumed opd Rx  3. Mediastinal nodes Stable Oct 2009 - July 2011; no further fu  4.RUL pulmonary nodule  - 5mm July 2011 -> sept 2012,  unchanged  - new 1.4 cm cavity and 9mm nodule RUL - Feb 2013, PEt Indeterminate and Oncimmune serum antigen 7 panel - negative  - March 2013 -   5. Obesity/osa on cpap and cough  Body mass index is 42.40 kg/(m^2). on 12/21/2011 Body mass index is 42.40 kg/(m^2). on 12/21/2011  6. RLL pulmonary scarring  Stable CT 2008 -> 2011; no further fu  7. CAD - s/p stent  OV 02/28/2012   Followup after repeat CT chest. No new symptoms. Stil dyspneic class 2-3. Denies AECOPD symptoms though he seemed to suggest that to the CMA during intake. Spirometrytoday fev1 2.4L/63%.  Super D Ct chest today shows increase in RUL 1.4 cm cavity to 1.5cm but no change in adjacent in the 9mm though I think this is denser. In addition, there are non-specific scattered ggo in the right lower lobe. In terms of smoking: not quitting. Struggling. Does not want help. In terms atypical right side chest pain: still present and unchanged  .Past, Family, Social reviewed: no change since last visit   Ct Super D Chest Wo Contrast  02/28/2012  *RADIOLOGY REPORT*  Clinical Data:  Right lung nodule.  Right chest pain  and increasing shortness of breath.  CT CHEST WITHOUT CONTRAST  Technique:  Multidetector CT imaging of the chest was performed using thin slice collimation for electromagnetic bronchoscopy planning purposes, without intravenous contrast.  Comparison:  PET CT 01/02/2012.  CT chest exams dating back to 08/05/2007.  Findings:  Mediastinal lymph nodes measure up to 12 mm adjacent to the left mainstem bronchus, stable. Although the hilar regions are difficult to definitively evaluate without IV contrast, a 10 mm right hilar node is suspected, stable.  No axillary adenopathy. Extensive coronary artery calcification.  Heart size normal.  No pericardial effusion.  Mild changes of paraseptal emphysema with possible mild superimposed subpleural fibrosis.  An irregular cavitary nodule in the posterior segment right upper lobe measures 1.6 x 1.8 cm (previously 1.4 x 1.3 cm).  The wall is slightly thicker and more irregular than on 12/17/2011.  The adjacent nodule measures 9 mm, stable.  The findings are new from 05/15/2010.  There are multiple scattered areas of peribronchovascular ground-glass bilaterally, new from 12/17/2011. Scattered tiny pulmonary nodules are unchanged from 12/17/2011.  No pleural fluid.  Airway is unremarkable with the exception of a small amount of debris dependently in the trachea.  Incidental imaging of the upper abdomen shows no acute findings. No worrisome lytic or sclerotic lesions. Old bilateral rib fractures. Mild anterior wedging of lower thoracic and  upper lumbar vertebral bodies, as before.  IMPRESSION:  1.  Enlarging irregular cavitary nodule in the right upper lobe is highly worrisome for primary bronchogenic carcinoma.  Adjacent nodule is stable. 2.  Borderline enlarged mediastinal and right hilar lymph nodes are stable from prior exams. 3.  New diffuse bilateral peribronchovascular ground-glass, indicative of a viral or atypical infectious process. 4.  Mild biapical paraseptal emphysema  with suspected superimposed subpleural fibrosis. 5.  Extensive coronary artery calcification.  Original Report Authenticated By: Reyes Ivan, M.D.     Review of Systems  Constitutional: Negative for fever and unexpected weight change.  HENT: Negative for ear pain, nosebleeds, congestion, sore throat, rhinorrhea, sneezing, trouble swallowing, dental problem, postnasal drip and sinus pressure.   Eyes: Negative for redness and itching.  Respiratory: Negative for cough, chest tightness, shortness of breath and wheezing.   Cardiovascular: Negative for palpitations and leg swelling.  Gastrointestinal: Negative for nausea and vomiting.  Genitourinary: Negative for dysuria.  Musculoskeletal: Negative for joint swelling.  Skin: Negative for rash.  Neurological: Negative for headaches.  Hematological: Does not bruise/bleed easily.  Psychiatric/Behavioral: Negative for dysphoric mood. The patient is not nervous/anxious.        Objective:   Physical Exam General:  well developed, well nourished, in no acute distressobese.  tobacco smell + Head:  normocephalic and atraumatic Eyes:  PERRLA/EOM intact; conjunctiva and sclera clear Ears:  TMs intact and clear with normal canals Nose:  no deformity, discharge, inflammation, or lesions Mouth:  no deformity or lesions. Mallamptatti class 3-4 Neck:  no masses, thyromegaly, or abnormal cervical nodes Chest Wall:  no deformities noted Lungs:  decreased BS bilateral and prolonged exhilation.   Msk:  no deformity or scoliosis noted with normal posture Pulses:  pulses normal Extremities:  no clubbing, cyanosis, edema, or deformity noted Neurologic:  CN II-XII grossly intact with normal reflexes, coordination, muscle strength and tone Skin:  intact without lesions or rashes Cervical Nodes:  no significant adenopathy Axillary Nodes:  no significant adenopathy Psych:  alert and cooperative; normal mood and affect; normal attention span and  concentration          Assessment & Plan:

## 2012-02-28 NOTE — Assessment & Plan Note (Signed)
Stable disease. Advised to continue existing medications

## 2012-02-28 NOTE — Assessment & Plan Note (Addendum)
IHigh intermediate prob 9mm nodule and 1.4cm cavity that has grown in 2 months (PET negative, onciummne panel negative). Difficult situation.  NExt option is ENB bx but false negative/non diagnostic results  in a growing lesion would  Mean resection. Discussed his preferences and he leans towards excision but understands consequences if lesion ends up benign. I will give him 1-3 days to think about this and then decide  Update 12:37 PM on 03/04/2012 D/w patient: ENB will be of benefit only if benign diagnosis is made which is of low prob. Will discuss at [facility] on 03/06/12 and get their opinion. If ENB, will have Dr Delton Coombes do it

## 2012-02-28 NOTE — Assessment & Plan Note (Signed)
Again counseled to quit 
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
## 2012-03-06 ENCOUNTER — Telehealth: Payer: Self-pay | Admitting: *Deleted

## 2012-03-06 ENCOUNTER — Institutional Professional Consult (permissible substitution) (INDEPENDENT_AMBULATORY_CARE_PROVIDER_SITE_OTHER): Payer: Managed Care, Other (non HMO) | Admitting: Surgery

## 2012-03-06 ENCOUNTER — Encounter: Payer: Self-pay | Admitting: Surgery

## 2012-03-06 ENCOUNTER — Telehealth: Payer: Self-pay | Admitting: Internal Medicine

## 2012-03-06 VITALS — BP 136/75 | HR 99 | Resp 18 | Ht 71.0 in | Wt 298.0 lb

## 2012-03-06 DIAGNOSIS — R918 Other nonspecific abnormal finding of lung field: Secondary | ICD-10-CM

## 2012-03-06 NOTE — Telephone Encounter (Signed)
Spoke with mr Gauna regarding appt today 03/06/12 at 3:30 with Dr. Laneta Simmers per request Dr. Marchelle Gearing.Pt verbalized understanding of time and location of appt.

## 2012-03-06 NOTE — Progress Notes (Signed)
[address]            [name] [address]          [phone]      PCP is Leo Grosser, MD, MD Referring Provider is Kalman Shan, MD  Chief Complaint  Patient presents with  . Lung Lesion    eval and treat    HPI:  The patient is a 61 year old obese smoker with COPD and sleep apnea who reports about a six-month history of exertional dyspnea. He has been followed by Dr. Marchelle Gearing. It sounds like he was having some right-sided chest wall pain and had a CT scan of the chest done in February which showed a 1.4 x 1.3 cm irregular cavitary nodule in the posterior segment of the right upper lobe. He had a PET scan performed which showed mild hypermetabolic activity within this nodule with a maximum SUV of 2.6. There was also a smaller nodule immediately adjacent to this cavitary lesion seen on CT scan but it was not possible to determine whether the smaller pulmonary nodule had hypermetabolic activity or not. There were no hypermetabolic lymph nodes seen. There were no other areas of hypermetabolic uptake within the abdomen or skeleton. He subsequently underwent a repeat CT scan of the chest on 02/28/2012 which showed an increase in the size of this cavitary nodule to 1.6 x 1.8 cm. The wall was slightly thicker and more irregular compared to the prior scan in February. The adjacent nodule measured 9 mm which was stable. There were multiple scattered areas of peri-bronchovascular groundglass opacity bilaterally that were new from his prior scan. He had an old chest CT from 2008 or 2009 which I reviewed and the lesion was not present at that time.  He said that he continues to smoke about one pack of cigarettes every 2 weeks. He does report exertional dyspnea but has had none at rest. He denies any chest pressure or pain with exertion.  Past Medical History  Diagnosis Date  . Hypertension   . COPD (chronic obstructive pulmonary disease)   . Diabetes  mellitus   . Sleep apnea   . Anemia   . Allergy   . GERD (gastroesophageal reflux disease)   . Thyroid disease   . Pneumonia   . Depression   . Shortness of breath   . Parathyroid disease   . Coronary artery disease     hx of cardiac stents  . Headache   . Myocardial infarct     Past Surgical History  Procedure Date  . Carpal tunnel release   . Hernia repair   . Rotator cuff repair     Family History  Problem Relation Age of Onset  . COPD Mother   . Heart attack Father     Social History History  Substance Use Topics  . Smoking status: Current Everyday Smoker -- 1.0 packs/day for 45 years    Types: Cigarettes  . Smokeless tobacco: Never Used   Comment: smoking cessation consult requested   . Alcohol Use: Yes     states drank 2-3 pints daily until about 6 weeks ago.    Current Outpatient Prescriptions  Medication Sig Dispense Refill  . albuterol (PROVENTIL) (2.5 MG/3ML) 0.083% nebulizer solution Take 2.5 mg by nebulization every 6 (six) hours as needed. For shortness of breath      . aspirin 81 MG tablet Take 81  mg by mouth daily.        . calcium carbonate (OS-CAL - DOSED IN MG OF ELEMENTAL CALCIUM) 1250 MG tablet Take 1 tablet by mouth 4 (four) times daily.        Marland Kitchen FLUoxetine (PROZAC) 40 MG capsule Take 40 mg by mouth daily.        . Fluticasone-Salmeterol (ADVAIR) 250-50 MCG/DOSE AEPB Inhale 1 puff into the lungs 2 (two) times daily.        Marland Kitchen gabapentin (NEURONTIN) 300 MG capsule Take 300 mg by mouth 3 (three) times daily.       Marland Kitchen ibuprofen (ADVIL,MOTRIN) 100 MG tablet Take 400 mg by mouth every 6 (six) hours as needed. For pain      . insulin aspart (NOVOLOG) 100 UNIT/ML injection Inject 30 Units into the skin 3 (three) times daily before meals.       . insulin glargine (LANTUS) 100 UNIT/ML injection Inject 100 Units into the skin daily.        Marland Kitchen ipratropium (ATROVENT) 0.02 % nebulizer solution Inhale 1 vial into the lungs as needed.      Marland Kitchen  losartan-hydrochlorothiazide (HYZAAR) 100-12.5 MG per tablet Take 1 tablet by mouth Daily.      . metoprolol succinate (TOPROL-XL) 25 MG 24 hr tablet Take 1 tablet by mouth Daily.      Marland Kitchen omeprazole (PRILOSEC) 20 MG capsule Take 20 mg by mouth daily.        . rosuvastatin (CRESTOR) 40 MG tablet Take 40 mg by mouth daily.        Marland Kitchen tiotropium (SPIRIVA) 18 MCG inhalation capsule Place 18 mcg into inhaler and inhale daily.        . traMADol (ULTRAM) 50 MG tablet Take 1 tablet (50 mg total) by mouth every 6 (six) hours as needed for pain.  20 tablet  0  . traZODone (DESYREL) 100 MG tablet Take 100 mg by mouth at bedtime.          No Known Allergies  Review of Systems  Constitutional: Positive for unexpected weight change. Negative for fever, chills, activity change, appetite change and fatigue.       Has gained weight  HENT: Negative.   Eyes: Negative.   Respiratory: Positive for shortness of breath. Negative for cough, chest tightness, wheezing and stridor.   Cardiovascular: Negative for chest pain, palpitations and leg swelling.  Gastrointestinal: Negative.   Genitourinary: Negative.   Musculoskeletal: Negative.   Neurological: Negative.   Hematological: Negative.   Psychiatric/Behavioral: Negative.     BP 136/75  Pulse 99  Resp 18  Ht 5\' 11"  (1.803 m)  Wt 298 lb (135.172 kg)  BMI 41.56 kg/m2  SpO2 94% Physical Exam  Constitutional: He is oriented to person, place, and time. He appears well-developed and well-nourished.       obese  HENT:  Head: Normocephalic and atraumatic.  Mouth/Throat: Oropharynx is clear and moist.  Eyes: Conjunctivae are normal. Pupils are equal, round, and reactive to light. No scleral icterus.  Neck: No JVD present. No tracheal deviation present. No thyromegaly present.  Cardiovascular: Normal rate, regular rhythm, normal heart sounds and intact distal pulses.  Exam reveals no gallop and no friction rub.   No murmur heard. Pulmonary/Chest: Effort normal  and breath sounds normal. No respiratory distress. He has no wheezes. He has no rales. He exhibits no tenderness.  Abdominal: Soft. Bowel sounds are normal. He exhibits no distension and no mass. There is no tenderness.  Musculoskeletal:  Mild bilateral ankle edema.  Lymphadenopathy:    He has no cervical adenopathy.  Neurological: He is alert and oriented to person, place, and time. He has normal strength. No cranial nerve deficit or sensory deficit.  Skin: Skin is warm and dry.  Psychiatric: He has a normal mood and affect.     Diagnostic Tests:  NUCLEAR MEDICINE PET CT INITIAL (PI) SKULL BASE TO THIGH   Technique:  16.2 mCi F-18 FDG was injected intravenously via the right forearm.  Full-ring PET imaging was performed from the skull base through the mid-thighs 60  minutes after injection.  CT data was obtained and used for attenuation correction and anatomic localization only.  (This was not acquired as a diagnostic CT examination.)   Fasting Blood Glucose:  276   Patient Weight:  304 pounds.   Comparison:  Chest CT on 12/17/2011 and abdomen pelvis CT on 11/13/11   Findings:   Chest:  The cavitary pulmonary nodule in the peripheral right upper lobe shows mild hypermetabolic activity, with SUV max of 2.6.   PET does not have a sufficient spatial resolution to determine whether the immediately adjacent smaller pulmonary nodule has hypermetabolic activity or not.   No hypermetabolic lymph nodes are identified within the thorax.   Neck:  No hypermetabolic masses or lymphadenopathy identified.   Abdomen:  No hypermetabolic masses or lymphadenopathy identified.   Pelvis:  No hypermetabolic masses or lymphadenopathy identified.   Skeletal:  No hypermetabolic bone lesions are identified.   IMPRESSION:   1. Right upper lobe cavitary pulmonary nodule and immediately adjacent smaller pulmonary nodule have low grade hypermetabolic activity, with SUV max of 2.6.   Bronchogenic carcinoma cannot be excluded, although differential diagnosis does include inflammatory etiologies. 2.  No hypermetabolic lymphadenopathy or distant metastatic disease.   Original Report Authenticated By: Danae Orleans, M.D. RADIOLOGY REPORT*   Clinical Data:  Right lung nodule.  Right chest pain and increasing shortness of breath.   CT CHEST WITHOUT CONTRAST   Technique:  Multidetector CT imaging of the chest was performed using thin slice collimation for electromagnetic bronchoscopy planning purposes, without intravenous contrast.   Comparison:  PET CT 01/02/2012.  CT chest exams dating back to 08/05/2007.   Findings:  Mediastinal lymph nodes measure up to 12 mm adjacent to the left mainstem bronchus, stable. Although the hilar regions are difficult to definitively evaluate without IV contrast, a 10 mm right hilar node is suspected, stable.  No axillary adenopathy. Extensive coronary artery calcification.  Heart size normal.  No pericardial effusion.   Mild changes of paraseptal emphysema with possible mild superimposed subpleural fibrosis.  An irregular cavitary nodule in the posterior segment right upper lobe measures 1.6 x 1.8 cm (previously 1.4 x 1.3 cm).  The wall is slightly thicker and more irregular than on 12/17/2011.  The adjacent nodule measures 9 mm, stable.  The findings are new from 05/15/2010.  There are multiple scattered areas of peribronchovascular ground-glass bilaterally, new from 12/17/2011. Scattered tiny pulmonary nodules are unchanged from 12/17/2011.  No pleural fluid.  Airway is unremarkable with the exception of a small amount of debris dependently in the trachea.   Incidental imaging of the upper abdomen shows no acute findings. No worrisome lytic or sclerotic lesions. Old bilateral rib fractures. Mild anterior wedging of lower thoracic and upper lumbar vertebral bodies, as before.   IMPRESSION:   1.  Enlarging irregular  cavitary nodule in the right upper lobe is highly worrisome for primary bronchogenic carcinoma.  Adjacent nodule is stable. 2.  Borderline enlarged mediastinal and right hilar lymph nodes are stable from prior exams. 3.  New diffuse bilateral peribronchovascular ground-glass, indicative of a viral or atypical infectious process. 4.  Mild biapical paraseptal emphysema with suspected superimposed subpleural fibrosis. 5.  Extensive coronary artery calcification.   Original Report Authenticated By: Reyes Ivan, M.D.     External Result Report     External Result Report     Imaging     Imaging Information     Signed by       Signed  Date/Time    Phone  Pager     Leanna Battles A  02/28/2012   9:17 AM EDT  [phone]  [phone]       Pulmonary function testing from February 2013 shows an FVC of 2.8 which was 57% of predicted. The FEV1 was 2.2 which was 59% predicted. The diffusion capacity was 18.7 which was 55% predicted. The patient was tested for desaturation and did not desaturate after 3 laps of 185 ft.  Impression:  The patient has a cavitary lesion in the right upper lobe of the lung that has slightly increased in size from February to April. The wall of the cavity appears thicker. There is a second nodule adjacent to it which appears stable.This cavitary lesion has slight hypermetabolic activity suggesting the possibility of adenocarcinoma. I think the size is small and ENB or CT-guided needle biopsy would have a higher false-negative rate. I think the options would be to proceed with surgical resection with right upper lobectomy or continue following this with CT scan. Given the information that we have I think the best option is to proceed with surgical resection. His pulmonary function is moderately depressed with an obstructive and restrictive defect and a moderate reduction in diffusion capacity. His operative risk is certainly increased due to ongoing smoking,  obesity, and heart disease. He had a stent placed in his right coronary artery in 2006 and a followup catheterization 2009 showed a widely patent stent with stable nonobstructive disease in the LAD and left circumflex. He said he saw Dr. Katrinka Blazing a few months ago and was told that everything seemed fine.The patient denies any exertional chest discomfort. With his diabetes it is hard to know if his exertional dyspnea is related to his heart or is related to his COPD and obesity. He would like to proceed with surgery. I think his pulmonary function would improve over the long term with smoking cessation and weight loss and he understands this.   Plan:  I asked him to stop smoking as of today. We will plan to proceed with right upper lobectomy on Monday May 13. I discussed the operative procedure with him in detail including alternatives, benefits, and risks including but not limited to bleeding, blood transfusion, infection, prolonged airleak, bronchial stump complications, pneumonia and respiratory failure, other organ dysfunction, and death. He understands all this and agrees to proceed.

## 2012-03-06 NOTE — Telephone Encounter (Signed)
D/w patieint about [facility] consensus  - no one is sure but everyone favors malignancy  See CVTS Dr Dorris Fetch today and if deemed operable go direct surgery. If inoperable, then ENB towards XRT  Patient agreed

## 2012-03-07 ENCOUNTER — Other Ambulatory Visit: Payer: Self-pay

## 2012-03-07 ENCOUNTER — Encounter (HOSPITAL_COMMUNITY): Payer: Self-pay | Admitting: Pharmacy Technician

## 2012-03-07 DIAGNOSIS — D381 Neoplasm of uncertain behavior of trachea, bronchus and lung: Secondary | ICD-10-CM

## 2012-03-13 ENCOUNTER — Encounter (HOSPITAL_COMMUNITY): Payer: Self-pay

## 2012-03-13 ENCOUNTER — Other Ambulatory Visit: Payer: Self-pay | Admitting: *Deleted

## 2012-03-13 ENCOUNTER — Encounter (HOSPITAL_COMMUNITY)
Admission: RE | Admit: 2012-03-13 | Discharge: 2012-03-13 | Disposition: A | Discharge status: Home / Self Care | Payer: Managed Care, Other (non HMO) | Source: Ambulatory Visit | Attending: Surgery | Admitting: Surgery

## 2012-03-13 VITALS — BP 131/72 | HR 71 | Temp 97.6°F | Resp 20 | Ht 71.0 in | Wt 299.9 lb

## 2012-03-13 DIAGNOSIS — E876 Hypokalemia: Secondary | ICD-10-CM

## 2012-03-13 DIAGNOSIS — D381 Neoplasm of uncertain behavior of trachea, bronchus and lung: Secondary | ICD-10-CM

## 2012-03-13 HISTORY — DX: Polyneuropathy, unspecified: G62.9

## 2012-03-13 HISTORY — DX: Personal history of other diseases of the digestive system: Z87.19

## 2012-03-13 HISTORY — DX: Insomnia, unspecified: G47.00

## 2012-03-13 HISTORY — DX: Cough: R05

## 2012-03-13 HISTORY — DX: Other specified cough: R05.8

## 2012-03-13 HISTORY — DX: Bronchitis, not specified as acute or chronic: J40

## 2012-03-13 HISTORY — DX: Encounter for other specified aftercare: Z51.89

## 2012-03-13 HISTORY — DX: Other nonspecific abnormal finding of lung field: R91.8

## 2012-03-13 HISTORY — DX: Reserved for inherently not codable concepts without codable children: IMO0001

## 2012-03-13 HISTORY — DX: Dorsalgia, unspecified: M54.9

## 2012-03-13 LAB — BLOOD GAS, ARTERIAL
Acid-Base Excess: 5.9 mmol/L — ABNORMAL HIGH (ref 0.0–2.0)
Drawn by: 206361
O2 Saturation: 90.3 %

## 2012-03-13 LAB — COMPREHENSIVE METABOLIC PANEL
AST: 13 U/L (ref 0–37)
CO2: 25 mEq/L (ref 19–32)
Calcium: 7.3 mg/dL — ABNORMAL LOW (ref 8.4–10.5)
Creatinine, Ser: 0.71 mg/dL (ref 0.50–1.35)
GFR calc non Af Amer: >90 mL/min (ref >90)

## 2012-03-13 LAB — URINALYSIS, ROUTINE W REFLEX MICROSCOPIC
Bilirubin Urine: NEGATIVE
Glucose, UA: NEGATIVE mg/dL
Hgb urine dipstick: NEGATIVE
Ketones, ur: NEGATIVE mg/dL
pH: 7.5 (ref 5.0–8.0)

## 2012-03-13 LAB — GLUCOSE, CAPILLARY: Glucose-Capillary: 50 mg/dL — ABNORMAL LOW (ref 70–99)

## 2012-03-13 LAB — CBC
MCH: 30.2 pg (ref 26.0–34.0)
MCV: 87.5 fL (ref 78.0–100.0)
Platelets: 276 10*3/uL (ref 150–400)
RBC: 4.17 MIL/uL — ABNORMAL LOW (ref 4.22–5.81)
RDW: 13.8 % (ref 11.5–15.5)

## 2012-03-13 LAB — APTT: aPTT: 39 seconds — ABNORMAL HIGH (ref 24–37)

## 2012-03-13 LAB — PROTIME-INR: INR: 1 (ref 0.00–1.49)

## 2012-03-13 LAB — ABO/RH: ABO/RH(D): O POS

## 2012-03-13 MED ORDER — POTASSIUM CHLORIDE CRYS ER 10 MEQ PO TBCR: 40.0000 meq | EXTENDED_RELEASE_TABLET | Freq: Once | ORAL | Status: DC

## 2012-03-13 NOTE — Pre-Procedure Instructions (Signed)
[name]  03/13/2012   Your procedure is scheduled on:  Mon, May 13 @ 7:30 AM  Report to [facility] at 5:30 AM.  Call this number if you have problems the morning of surgery: [phone]   Remember:   Do not eat food:After Midnight.  May have clear liquids: up to 4 Hours before arrival.(until 1:30 am)  Clear liquids include soda, tea, black coffee, apple or grape juice, broth,water  Take these medicines the morning of surgery with A SIP OF WATER: Prozac,Gabapentin,Spiriva,Prilosec,Eye Drops,and Albuterol<<Bring Your Inhaler With You>>   Do not wear jewelry  Do not wear lotions, powders, or perfumes.   Do not bring valuables to the hospital.  Contacts, dentures or bridgework may not be worn into surgery.  Leave suitcase in the car. After surgery it may be brought to your room.  For patients admitted to the hospital, checkout time is 11:00 AM the day of discharge.   Special Instructions: CHG Shower Use Special Wash: 1/2 bottle night before surgery and 1/2 bottle morning of surgery.   Please read over the following fact sheets that you were given: Pain Booklet, Coughing and Deep Breathing, Blood Transfusion Information, MRSA Information and Surgical Site Infection Prevention

## 2012-03-13 NOTE — Progress Notes (Signed)
Rechecked sugar and it was 87;per Shonna Chock PA ok to let pt go home

## 2012-03-13 NOTE — Telephone Encounter (Signed)
Rec'd a call from short stay to inform Dr. Laneta Simmers that [name]  had an episode of hypoglycemia while there for his pre-op visit...BS of 49...O.J. was given and blood sugar elevated and he was stable.  After he left, his K was noted to be 2.7 on his pre op labs and she called to notify us.  I informed Dr. Laneta Simmers  And he ordered a one time dose of K 40 MEQ. I called [name] and informed him of this and that I had called this med to his pharmacy.  He said that he understood.

## 2012-03-13 NOTE — Progress Notes (Signed)
Average fasting blood sugars run around 130

## 2012-03-13 NOTE — Progress Notes (Signed)
As I was walking pt across the hall to have his bloodwork drawn,he felt dizzy and had to lean up against the wall.Placed him in a wheelchair.Checked his blood sugar which was 50.Pt drank 2 cups of Orange Juice.Will recheck in 

## 2012-03-13 NOTE — Progress Notes (Signed)
Sleep study in epic from 2005

## 2012-03-13 NOTE — Progress Notes (Addendum)
Dr.Henry Katrinka Blazing is cardiologist-last visit about 6months ago-follows up on a yearly basis-requested last office visit  Echo in epic from 2010 Stress test in epic from 11/2010 Several heart cath reports in epic  Medical MD is with [employer]

## 2012-03-13 NOTE — Progress Notes (Signed)
Notified of Jolene with Dr.Bartle of pt's K+-2.7;will let Dr.Bartle know

## 2012-03-14 NOTE — Progress Notes (Signed)
All abnormal labs should be communicated to Dr. Laneta Simmers.  Otherwise, the pt appears ready for surgery.

## 2012-03-16 MED ORDER — DEXTROSE 5 % IV SOLN
1.5000 g | INTRAVENOUS | Status: AC
  Administered 2012-03-17: 1.5 g via INTRAVENOUS
  Filled 2012-03-16: qty 1.5

## 2012-03-16 NOTE — H&P (Signed)
                 [address]            [place] [address]          [phone]      PCP is PICKARD,WARREN TOM, MD, MD Referring Provider is Ramaswamy, Murali, MD  Chief Complaint  Patient presents with  . Lung Lesion    eval and treat    HPI:  The patient is a 60-year-old obese smoker with COPD and sleep apnea who reports about a six-month history of exertional dyspnea. He has been followed by Dr. Ramaswamy. It sounds like he was having some right-sided chest wall pain and had a CT scan of the chest done in February which showed a 1.4 x 1.3 cm irregular cavitary nodule in the posterior segment of the right upper lobe. He had a PET scan performed which showed mild hypermetabolic activity within this nodule with a maximum SUV of 2.6. There was also a smaller nodule immediately adjacent to this cavitary lesion seen on CT scan but it was not possible to determine whether the smaller pulmonary nodule had hypermetabolic activity or not. There were no hypermetabolic lymph nodes seen. There were no other areas of hypermetabolic uptake within the abdomen or skeleton. He subsequently underwent a repeat CT scan of the chest on 02/28/2012 which showed an increase in the size of this cavitary nodule to 1.6 x 1.8 cm. The wall was slightly thicker and more irregular compared to the prior scan in February. The adjacent nodule measured 9 mm which was stable. There were multiple scattered areas of peri-bronchovascular groundglass opacity bilaterally that were new from his prior scan. He had an old chest CT from 2008 or 2009 which I reviewed and the lesion was not present at that time.  He said that he continues to smoke about one pack of cigarettes every 2 weeks. He does report exertional dyspnea but has had none at rest. He denies any chest pressure or pain with exertion.  Past Medical History  Diagnosis Date  . Hypertension   . COPD (chronic obstructive pulmonary disease)   . Diabetes  mellitus   . Sleep apnea   . Anemia   . Allergy   . GERD (gastroesophageal reflux disease)   . Thyroid disease   . Pneumonia   . Depression   . Shortness of breath   . Parathyroid disease   . Coronary artery disease     hx of cardiac stents  . Headache   . Myocardial infarct     Past Surgical History  Procedure Date  . Carpal tunnel release   . Hernia repair   . Rotator cuff repair     Family History  Problem Relation Age of Onset  . COPD Mother   . Heart attack Father     Social History History  Substance Use Topics  . Smoking status: Current Everyday Smoker -- 1.0 packs/day for 45 years    Types: Cigarettes  . Smokeless tobacco: Never Used   Comment: smoking cessation consult requested   . Alcohol Use: Yes     states drank 2-3 pints daily until about 6 weeks ago.    Current Outpatient Prescriptions  Medication Sig Dispense Refill  . albuterol (PROVENTIL) (2.5 MG/3ML) 0.083% nebulizer solution Take 2.5 mg by nebulization every 6 (six) hours as needed. For shortness of breath      . aspirin 81 MG tablet Take 81   mg by mouth daily.        . calcium carbonate (OS-CAL - DOSED IN MG OF ELEMENTAL CALCIUM) 1250 MG tablet Take 1 tablet by mouth 4 (four) times daily.        . FLUoxetine (PROZAC) 40 MG capsule Take 40 mg by mouth daily.        . Fluticasone-Salmeterol (ADVAIR) 250-50 MCG/DOSE AEPB Inhale 1 puff into the lungs 2 (two) times daily.        . gabapentin (NEURONTIN) 300 MG capsule Take 300 mg by mouth 3 (three) times daily.       . ibuprofen (ADVIL,MOTRIN) 100 MG tablet Take 400 mg by mouth every 6 (six) hours as needed. For pain      . insulin aspart (NOVOLOG) 100 UNIT/ML injection Inject 30 Units into the skin 3 (three) times daily before meals.       . insulin glargine (LANTUS) 100 UNIT/ML injection Inject 100 Units into the skin daily.        . ipratropium (ATROVENT) 0.02 % nebulizer solution Inhale 1 vial into the lungs as needed.      .  losartan-hydrochlorothiazide (HYZAAR) 100-12.5 MG per tablet Take 1 tablet by mouth Daily.      . metoprolol succinate (TOPROL-XL) 25 MG 24 hr tablet Take 1 tablet by mouth Daily.      . omeprazole (PRILOSEC) 20 MG capsule Take 20 mg by mouth daily.        . rosuvastatin (CRESTOR) 40 MG tablet Take 40 mg by mouth daily.        . tiotropium (SPIRIVA) 18 MCG inhalation capsule Place 18 mcg into inhaler and inhale daily.        . traMADol (ULTRAM) 50 MG tablet Take 1 tablet (50 mg total) by mouth every 6 (six) hours as needed for pain.  20 tablet  0  . traZODone (DESYREL) 100 MG tablet Take 100 mg by mouth at bedtime.          No Known Allergies  Review of Systems  Constitutional: Positive for unexpected weight change. Negative for fever, chills, activity change, appetite change and fatigue.       Has gained weight  HENT: Negative.   Eyes: Negative.   Respiratory: Positive for shortness of breath. Negative for cough, chest tightness, wheezing and stridor.   Cardiovascular: Negative for chest pain, palpitations and leg swelling.  Gastrointestinal: Negative.   Genitourinary: Negative.   Musculoskeletal: Negative.   Neurological: Negative.   Hematological: Negative.   Psychiatric/Behavioral: Negative.     BP 136/75  Pulse 99  Resp 18  Ht 5' 11" (1.803 m)  Wt 298 lb (135.172 kg)  BMI 41.56 kg/m2  SpO2 94% Physical Exam  Constitutional: He is oriented to person, place, and time. He appears well-developed and well-nourished.       obese  HENT:  Head: Normocephalic and atraumatic.  Mouth/Throat: Oropharynx is clear and moist.  Eyes: Conjunctivae are normal. Pupils are equal, round, and reactive to light. No scleral icterus.  Neck: No JVD present. No tracheal deviation present. No thyromegaly present.  Cardiovascular: Normal rate, regular rhythm, normal heart sounds and intact distal pulses.  Exam reveals no gallop and no friction rub.   No murmur heard. Pulmonary/Chest: Effort normal  and breath sounds normal. No respiratory distress. He has no wheezes. He has no rales. He exhibits no tenderness.  Abdominal: Soft. Bowel sounds are normal. He exhibits no distension and no mass. There is no tenderness.  Musculoskeletal:         Mild bilateral ankle edema.  Lymphadenopathy:    He has no cervical adenopathy.  Neurological: He is alert and oriented to person, place, and time. He has normal strength. No cranial nerve deficit or sensory deficit.  Skin: Skin is warm and dry.  Psychiatric: He has a normal mood and affect.     Diagnostic Tests:  NUCLEAR MEDICINE PET CT INITIAL (PI) SKULL BASE TO THIGH   Technique:  16.2 mCi F-18 FDG was injected intravenously via the right forearm.  Full-ring PET imaging was performed from the skull base through the mid-thighs 60  minutes after injection.  CT data was obtained and used for attenuation correction and anatomic localization only.  (This was not acquired as a diagnostic CT examination.)   Fasting Blood Glucose:  276   Patient Weight:  304 pounds.   Comparison:  Chest CT on 12/17/2011 and abdomen pelvis CT on 11/13/11   Findings:   Chest:  The cavitary pulmonary nodule in the peripheral right upper lobe shows mild hypermetabolic activity, with SUV max of 2.6.   PET does not have a sufficient spatial resolution to determine whether the immediately adjacent smaller pulmonary nodule has hypermetabolic activity or not.   No hypermetabolic lymph nodes are identified within the thorax.   Neck:  No hypermetabolic masses or lymphadenopathy identified.   Abdomen:  No hypermetabolic masses or lymphadenopathy identified.   Pelvis:  No hypermetabolic masses or lymphadenopathy identified.   Skeletal:  No hypermetabolic bone lesions are identified.   IMPRESSION:   1. Right upper lobe cavitary pulmonary nodule and immediately adjacent smaller pulmonary nodule have low grade hypermetabolic activity, with SUV max of 2.6.   Bronchogenic carcinoma cannot be excluded, although differential diagnosis does include inflammatory etiologies. 2.  No hypermetabolic lymphadenopathy or distant metastatic disease.   Original Report Authenticated By: JOHN A. STAHL, M.D. RADIOLOGY REPORT*   Clinical Data:  Right lung nodule.  Right chest pain and increasing shortness of breath.   CT CHEST WITHOUT CONTRAST   Technique:  Multidetector CT imaging of the chest was performed using thin slice collimation for electromagnetic bronchoscopy planning purposes, without intravenous contrast.   Comparison:  PET CT 01/02/2012.  CT chest exams dating back to 08/05/2007.   Findings:  Mediastinal lymph nodes measure up to 12 mm adjacent to the left mainstem bronchus, stable. Although the hilar regions are difficult to definitively evaluate without IV contrast, a 10 mm right hilar node is suspected, stable.  No axillary adenopathy. Extensive coronary artery calcification.  Heart size normal.  No pericardial effusion.   Mild changes of paraseptal emphysema with possible mild superimposed subpleural fibrosis.  An irregular cavitary nodule in the posterior segment right upper lobe measures 1.6 x 1.8 cm (previously 1.4 x 1.3 cm).  The wall is slightly thicker and more irregular than on 12/17/2011.  The adjacent nodule measures 9 mm, stable.  The findings are new from 05/15/2010.  There are multiple scattered areas of peribronchovascular ground-glass bilaterally, new from 12/17/2011. Scattered tiny pulmonary nodules are unchanged from 12/17/2011.  No pleural fluid.  Airway is unremarkable with the exception of a small amount of debris dependently in the trachea.   Incidental imaging of the upper abdomen shows no acute findings. No worrisome lytic or sclerotic lesions. Old bilateral rib fractures. Mild anterior wedging of lower thoracic and upper lumbar vertebral bodies, as before.   IMPRESSION:   1.  Enlarging irregular  cavitary nodule in the right upper lobe is highly worrisome for primary bronchogenic carcinoma.    Adjacent nodule is stable. 2.  Borderline enlarged mediastinal and right hilar lymph nodes are stable from prior exams. 3.  New diffuse bilateral peribronchovascular ground-glass, indicative of a viral or atypical infectious process. 4.  Mild biapical paraseptal emphysema with suspected superimposed subpleural fibrosis. 5.  Extensive coronary artery calcification.   Original Report Authenticated By: MELINDA A. BLIETZ, M.D.     External Result Report     External Result Report     Imaging     Imaging Information     Signed by       Signed  Date/Time    Phone  Pager     BLIETZ, MELINDA A  02/28/2012   9:17 AM EDT  [phone]  [phone]       Pulmonary function testing from February 2013 shows an FVC of 2.8 which was 57% of predicted. The FEV1 was 2.2 which was 59% predicted. The diffusion capacity was 18.7 which was 55% predicted. The patient was tested for desaturation and did not desaturate after 3 laps of 185 ft.  Impression:  The patient has a cavitary lesion in the right upper lobe of the lung that has slightly increased in size from February to April. The wall of the cavity appears thicker. There is a second nodule adjacent to it which appears stable.This cavitary lesion has slight hypermetabolic activity suggesting the possibility of adenocarcinoma. I think the size is small and ENB or CT-guided needle biopsy would have a higher false-negative rate. I think the options would be to proceed with surgical resection with right upper lobectomy or continue following this with CT scan. Given the information that we have I think the best option is to proceed with surgical resection. His pulmonary function is moderately depressed with an obstructive and restrictive defect and a moderate reduction in diffusion capacity. His operative risk is certainly increased due to ongoing smoking,  obesity, and heart disease. He had a stent placed in his right coronary artery in 2006 and a followup catheterization 2009 showed a widely patent stent with stable nonobstructive disease in the LAD and left circumflex. He said he saw Dr. Smith a few months ago and was told that everything seemed fine.The patient denies any exertional chest discomfort. With his diabetes it is hard to know if his exertional dyspnea is related to his heart or is related to his COPD and obesity. He would like to proceed with surgery. I think his pulmonary function would improve over the long term with smoking cessation and weight loss and he understands this.   Plan:  I asked him to stop smoking as of today. We will plan to proceed with right upper lobectomy on Monday May 13. I discussed the operative procedure with him in detail including alternatives, benefits, and risks including but not limited to bleeding, blood transfusion, infection, prolonged airleak, bronchial stump complications, pneumonia and respiratory failure, other organ dysfunction, and death. He understands all this and agrees to proceed.   a stent placed in his right coronary artery in 2006 and a followup catheterization 2009 showed a widely patent stent with stable nonobstructive disease in the LAD and left circumflex. He said he saw Dr. Katrinka Blazing a few months ago and was told that everything seemed fine.The patient denies any exertional chest discomfort. With his diabetes it is hard to know if his exertional dyspnea is related to his heart or is related to his COPD and obesity. He would like to proceed with surgery. I think his pulmonary function would improve over the long term with smoking cessation and weight loss and he understands this.     Plan:   I asked him to stop smoking as of today. We will plan to proceed with right upper lobectomy on Monday May 13. I discussed the operative procedure with him in detail including alternatives, benefits, and risks including but not limited to bleeding, blood transfusion, infection, prolonged airleak, bronchial stump complications, pneumonia and respiratory failure, other organ dysfunction, and death. He understands all this and agrees to proceed.

## 2012-03-17 ENCOUNTER — Encounter (HOSPITAL_COMMUNITY)
Admission: RE | Disposition: A | Discharge status: Home / Self Care | Payer: Self-pay | Source: Ambulatory Visit | Attending: Surgery

## 2012-03-17 ENCOUNTER — Ambulatory Visit (HOSPITAL_COMMUNITY): Payer: Managed Care, Other (non HMO)

## 2012-03-17 ENCOUNTER — Ambulatory Visit (HOSPITAL_COMMUNITY): Payer: Managed Care, Other (non HMO) | Admitting: Anesthesiology

## 2012-03-17 ENCOUNTER — Inpatient Hospital Stay (HOSPITAL_COMMUNITY)
Admission: RE | Admit: 2012-03-17 | Discharge: 2012-04-04 | DRG: 163 | Disposition: A | Discharge status: Home / Self Care | Payer: Managed Care, Other (non HMO) | Source: Ambulatory Visit | Attending: Surgery | Admitting: Surgery

## 2012-03-17 ENCOUNTER — Encounter (HOSPITAL_COMMUNITY): Payer: Self-pay | Admitting: Vascular Surgery

## 2012-03-17 ENCOUNTER — Encounter (HOSPITAL_COMMUNITY): Payer: Self-pay | Admitting: *Deleted

## 2012-03-17 ENCOUNTER — Inpatient Hospital Stay (HOSPITAL_COMMUNITY): Payer: Managed Care, Other (non HMO)

## 2012-03-17 DIAGNOSIS — G4730 Sleep apnea, unspecified: Secondary | ICD-10-CM

## 2012-03-17 DIAGNOSIS — R0602 Shortness of breath: Secondary | ICD-10-CM

## 2012-03-17 DIAGNOSIS — J4489 Other specified chronic obstructive pulmonary disease: Secondary | ICD-10-CM | POA: Diagnosis present

## 2012-03-17 DIAGNOSIS — E1142 Type 2 diabetes mellitus with diabetic polyneuropathy: Secondary | ICD-10-CM | POA: Diagnosis present

## 2012-03-17 DIAGNOSIS — I2510 Atherosclerotic heart disease of native coronary artery without angina pectoris: Secondary | ICD-10-CM | POA: Diagnosis present

## 2012-03-17 DIAGNOSIS — D649 Anemia, unspecified: Secondary | ICD-10-CM | POA: Diagnosis present

## 2012-03-17 DIAGNOSIS — N170 Acute kidney failure with tubular necrosis: Secondary | ICD-10-CM | POA: Diagnosis not present

## 2012-03-17 DIAGNOSIS — R000 Tachycardia, unspecified: Secondary | ICD-10-CM | POA: Diagnosis present

## 2012-03-17 DIAGNOSIS — J189 Pneumonia, unspecified organism: Secondary | ICD-10-CM | POA: Diagnosis not present

## 2012-03-17 DIAGNOSIS — I252 Old myocardial infarction: Secondary | ICD-10-CM

## 2012-03-17 DIAGNOSIS — I10 Essential (primary) hypertension: Secondary | ICD-10-CM | POA: Diagnosis present

## 2012-03-17 DIAGNOSIS — C3411 Malignant neoplasm of upper lobe, right bronchus or lung: Secondary | ICD-10-CM | POA: Insufficient documentation

## 2012-03-17 DIAGNOSIS — J449 Chronic obstructive pulmonary disease, unspecified: Secondary | ICD-10-CM

## 2012-03-17 DIAGNOSIS — N179 Acute kidney failure, unspecified: Secondary | ICD-10-CM

## 2012-03-17 DIAGNOSIS — Z8249 Family history of ischemic heart disease and other diseases of the circulatory system: Secondary | ICD-10-CM

## 2012-03-17 DIAGNOSIS — E876 Hypokalemia: Secondary | ICD-10-CM | POA: Diagnosis not present

## 2012-03-17 DIAGNOSIS — Z9889 Other specified postprocedural states: Secondary | ICD-10-CM

## 2012-03-17 DIAGNOSIS — T450X5A Adverse effect of antiallergic and antiemetic drugs, initial encounter: Secondary | ICD-10-CM | POA: Diagnosis not present

## 2012-03-17 DIAGNOSIS — G4733 Obstructive sleep apnea (adult) (pediatric): Secondary | ICD-10-CM | POA: Diagnosis present

## 2012-03-17 DIAGNOSIS — E119 Type 2 diabetes mellitus without complications: Secondary | ICD-10-CM

## 2012-03-17 DIAGNOSIS — J180 Bronchopneumonia, unspecified organism: Secondary | ICD-10-CM

## 2012-03-17 DIAGNOSIS — D381 Neoplasm of uncertain behavior of trachea, bronchus and lung: Secondary | ICD-10-CM

## 2012-03-17 DIAGNOSIS — Z794 Long term (current) use of insulin: Secondary | ICD-10-CM

## 2012-03-17 DIAGNOSIS — F17200 Nicotine dependence, unspecified, uncomplicated: Secondary | ICD-10-CM | POA: Diagnosis present

## 2012-03-17 DIAGNOSIS — T3995XA Adverse effect of unspecified nonopioid analgesic, antipyretic and antirheumatic, initial encounter: Secondary | ICD-10-CM | POA: Diagnosis not present

## 2012-03-17 DIAGNOSIS — K219 Gastro-esophageal reflux disease without esophagitis: Secondary | ICD-10-CM | POA: Diagnosis present

## 2012-03-17 DIAGNOSIS — J9601 Acute respiratory failure with hypoxia: Secondary | ICD-10-CM

## 2012-03-17 DIAGNOSIS — E1149 Type 2 diabetes mellitus with other diabetic neurological complication: Secondary | ICD-10-CM | POA: Diagnosis present

## 2012-03-17 DIAGNOSIS — Z902 Acquired absence of lung [part of]: Secondary | ICD-10-CM

## 2012-03-17 DIAGNOSIS — T383X5A Adverse effect of insulin and oral hypoglycemic [antidiabetic] drugs, initial encounter: Secondary | ICD-10-CM | POA: Diagnosis not present

## 2012-03-17 DIAGNOSIS — F3289 Other specified depressive episodes: Secondary | ICD-10-CM | POA: Diagnosis present

## 2012-03-17 DIAGNOSIS — D360 Benign neoplasm of lymph nodes: Secondary | ICD-10-CM | POA: Diagnosis present

## 2012-03-17 DIAGNOSIS — E669 Obesity, unspecified: Secondary | ICD-10-CM | POA: Diagnosis present

## 2012-03-17 DIAGNOSIS — J9620 Acute and chronic respiratory failure, unspecified whether with hypoxia or hypercapnia: Secondary | ICD-10-CM | POA: Diagnosis present

## 2012-03-17 DIAGNOSIS — F329 Major depressive disorder, single episode, unspecified: Secondary | ICD-10-CM | POA: Diagnosis present

## 2012-03-17 DIAGNOSIS — J441 Chronic obstructive pulmonary disease with (acute) exacerbation: Secondary | ICD-10-CM

## 2012-03-17 DIAGNOSIS — J95811 Postprocedural pneumothorax: Secondary | ICD-10-CM | POA: Diagnosis not present

## 2012-03-17 DIAGNOSIS — Z9861 Coronary angioplasty status: Secondary | ICD-10-CM

## 2012-03-17 DIAGNOSIS — C3410 Malignant neoplasm of upper lobe, unspecified bronchus or lung: Principal | ICD-10-CM | POA: Diagnosis present

## 2012-03-17 DIAGNOSIS — G4700 Insomnia, unspecified: Secondary | ICD-10-CM | POA: Diagnosis present

## 2012-03-17 DIAGNOSIS — F10231 Alcohol dependence with withdrawal delirium: Secondary | ICD-10-CM | POA: Diagnosis not present

## 2012-03-17 DIAGNOSIS — Z79899 Other long term (current) drug therapy: Secondary | ICD-10-CM

## 2012-03-17 DIAGNOSIS — F1010 Alcohol abuse, uncomplicated: Secondary | ICD-10-CM | POA: Diagnosis present

## 2012-03-17 DIAGNOSIS — Z7982 Long term (current) use of aspirin: Secondary | ICD-10-CM

## 2012-03-17 DIAGNOSIS — F10931 Alcohol use, unspecified with withdrawal delirium: Secondary | ICD-10-CM | POA: Diagnosis not present

## 2012-03-17 HISTORY — PX: LOBECTOMY: SHX5089

## 2012-03-17 LAB — GLUCOSE, CAPILLARY
Glucose-Capillary: 167 mg/dL — ABNORMAL HIGH (ref 70–99)
Glucose-Capillary: 216 mg/dL — ABNORMAL HIGH (ref 70–99)
Glucose-Capillary: 173 mg/dL — ABNORMAL HIGH (ref 70–99)
Glucose-Capillary: 135 mg/dL — ABNORMAL HIGH (ref 70–99)

## 2012-03-17 LAB — POTASSIUM: Potassium: 3.1 mEq/L — ABNORMAL LOW (ref 3.5–5.1)

## 2012-03-17 LAB — POCT I-STAT 4, (NA,K, GLUC, HGB,HCT)
Glucose, Bld: 178 mg/dL — ABNORMAL HIGH (ref 70–99)
HCT: 33 % — ABNORMAL LOW (ref 39.0–52.0)
Potassium: 3 mEq/L — ABNORMAL LOW (ref 3.5–5.1)

## 2012-03-17 SURGERY — LOBECTOMY, LUNG, OPEN
Anesthesia: General | Site: Chest | Laterality: Right | Wound class: Clean Contaminated

## 2012-03-17 MED ORDER — POTASSIUM CHLORIDE 10 MEQ/50ML IV SOLN
10.0000 meq | INTRAVENOUS | Status: AC | PRN
  Administered 2012-03-17 (×3): 10 meq via INTRAVENOUS

## 2012-03-17 MED ORDER — DIPHENHYDRAMINE HCL 50 MG/ML IJ SOLN: 12.5000 mg | Freq: Four times a day (QID) | INTRAMUSCULAR | Status: DC | PRN

## 2012-03-17 MED ORDER — DIPHENHYDRAMINE HCL 12.5 MG/5ML PO ELIX
12.5000 mg | ORAL_SOLUTION | Freq: Four times a day (QID) | ORAL | Status: DC | PRN
  Filled 2012-03-17: qty 5

## 2012-03-17 MED ORDER — DEXTROSE 50 % IV SOLN
25.0000 mL | INTRAVENOUS | Status: DC | PRN
  Administered 2012-03-23 – 2012-03-30 (×3): 25 mL via INTRAVENOUS
  Filled 2012-03-17 (×3): qty 50

## 2012-03-17 MED ORDER — SUFENTANIL CITRATE 50 MCG/ML IV SOLN
INTRAVENOUS | Status: DC | PRN
  Administered 2012-03-17 (×3): 10 ug via INTRAVENOUS
  Administered 2012-03-17: 20 ug via INTRAVENOUS
  Administered 2012-03-17 (×5): 10 ug via INTRAVENOUS

## 2012-03-17 MED ORDER — BISACODYL 5 MG PO TBEC
10.0000 mg | DELAYED_RELEASE_TABLET | Freq: Every day | ORAL | Status: DC
  Administered 2012-03-18 – 2012-04-02 (×15): 10 mg via ORAL
  Filled 2012-03-17 (×5): qty 2
  Filled 2012-03-17: qty 1
  Filled 2012-03-17 (×7): qty 2

## 2012-03-17 MED ORDER — ALBUTEROL SULFATE HFA 108 (90 BASE) MCG/ACT IN AERS
4.0000 | INHALATION_SPRAY | Freq: Four times a day (QID) | RESPIRATORY_TRACT | Status: AC
  Administered 2012-03-17 – 2012-03-19 (×7): 4 via RESPIRATORY_TRACT
  Filled 2012-03-17: qty 6.7

## 2012-03-17 MED ORDER — NAPHAZOLINE-PHENIRAMINE 0.025-0.3 % OP SOLN
1.0000 [drp] | Freq: Every day | OPHTHALMIC | Status: DC | PRN
  Filled 2012-03-17: qty 15

## 2012-03-17 MED ORDER — KETOROLAC TROMETHAMINE 15 MG/ML IJ SOLN
15.0000 mg | Freq: Four times a day (QID) | INTRAMUSCULAR | Status: DC | PRN
  Administered 2012-03-18 – 2012-03-21 (×4): 15 mg via INTRAVENOUS
  Filled 2012-03-17 (×4): qty 1

## 2012-03-17 MED ORDER — POTASSIUM CHLORIDE 10 MEQ/50ML IV SOLN
10.0000 meq | Freq: Every day | INTRAVENOUS | Status: DC | PRN
  Administered 2012-03-19 – 2012-03-21 (×6): 10 meq via INTRAVENOUS
  Filled 2012-03-17 (×2): qty 150
  Filled 2012-03-17: qty 50
  Filled 2012-03-17: qty 100
  Filled 2012-03-17: qty 50

## 2012-03-17 MED ORDER — TRAZODONE HCL 100 MG PO TABS
100.0000 mg | ORAL_TABLET | Freq: Every day | ORAL | Status: DC
  Administered 2012-03-17 – 2012-04-03 (×18): 100 mg via ORAL
  Filled 2012-03-17 (×19): qty 1

## 2012-03-17 MED ORDER — HYDROMORPHONE 0.3 MG/ML IV SOLN
INTRAVENOUS | Status: DC
  Administered 2012-03-17: 2.7 mg via INTRAVENOUS
  Administered 2012-03-17 (×2): via INTRAVENOUS
  Administered 2012-03-17: 7.5 mg via INTRAVENOUS
  Administered 2012-03-17: 1.4 mg via INTRAVENOUS
  Administered 2012-03-18: 5.7 mg via INTRAVENOUS
  Administered 2012-03-18: 3.9 mg via INTRAVENOUS
  Administered 2012-03-18: 3.6 mg via INTRAVENOUS
  Administered 2012-03-18: 17:00:00 via INTRAVENOUS
  Administered 2012-03-18: 3.9 mg via INTRAVENOUS
  Administered 2012-03-18: 09:00:00 via INTRAVENOUS
  Administered 2012-03-19: 2.1 mg via INTRAVENOUS
  Administered 2012-03-19: 1.5 mg via INTRAVENOUS
  Administered 2012-03-19: 2.64 mg via INTRAVENOUS
  Administered 2012-03-19: via INTRAVENOUS
  Administered 2012-03-19: 2.1 mg via INTRAVENOUS
  Administered 2012-03-19: 2.4 mg via INTRAVENOUS
  Administered 2012-03-19: 2.1 mg via INTRAVENOUS
  Administered 2012-03-20: 0.3 mg via INTRAVENOUS
  Administered 2012-03-20: 0.277 mg via INTRAVENOUS
  Administered 2012-03-20: 2.7 mg via INTRAVENOUS
  Administered 2012-03-20: 2.4 mg via INTRAVENOUS
  Administered 2012-03-20: 0.6 mg via INTRAVENOUS
  Filled 2012-03-17 (×7): qty 25

## 2012-03-17 MED ORDER — LACTATED RINGERS IV SOLN
INTRAVENOUS | Status: DC | PRN
  Administered 2012-03-17: 07:00:00 via INTRAVENOUS

## 2012-03-17 MED ORDER — ROCURONIUM BROMIDE 100 MG/10ML IV SOLN
INTRAVENOUS | Status: DC | PRN
  Administered 2012-03-17: 50 mg via INTRAVENOUS

## 2012-03-17 MED ORDER — PANTOPRAZOLE SODIUM 40 MG PO TBEC
40.0000 mg | DELAYED_RELEASE_TABLET | Freq: Every day | ORAL | Status: DC
  Administered 2012-03-18 – 2012-04-04 (×18): 40 mg via ORAL
  Filled 2012-03-17 (×18): qty 1

## 2012-03-17 MED ORDER — PROPOFOL 10 MG/ML IV BOLUS
INTRAVENOUS | Status: DC | PRN
  Administered 2012-03-17: 200 mg via INTRAVENOUS

## 2012-03-17 MED ORDER — FLUOXETINE HCL 20 MG PO CAPS
40.0000 mg | ORAL_CAPSULE | Freq: Every day | ORAL | Status: DC
  Administered 2012-03-18 – 2012-04-04 (×18): 40 mg via ORAL
  Filled 2012-03-17 (×18): qty 2

## 2012-03-17 MED ORDER — FLUOXETINE HCL 40 MG PO CAPS: 40.0000 mg | ORAL_CAPSULE | Freq: Every day | ORAL | Status: DC

## 2012-03-17 MED ORDER — SUCCINYLCHOLINE CHLORIDE 20 MG/ML IJ SOLN
INTRAMUSCULAR | Status: DC | PRN
  Administered 2012-03-17: 100 mg via INTRAVENOUS

## 2012-03-17 MED ORDER — SODIUM CHLORIDE 0.9 % IV SOLN
INTRAVENOUS | Status: AC
  Administered 2012-03-17: 1.3 [IU]/h via INTRAVENOUS
  Filled 2012-03-17: qty 1

## 2012-03-17 MED ORDER — SODIUM CHLORIDE 0.9 % IJ SOLN
9.0000 mL | INTRAMUSCULAR | Status: DC | PRN
  Administered 2012-03-18 – 2012-03-20 (×2): via INTRAVENOUS

## 2012-03-17 MED ORDER — ONDANSETRON HCL 4 MG/2ML IJ SOLN: 4.0000 mg | Freq: Four times a day (QID) | INTRAMUSCULAR | Status: DC | PRN

## 2012-03-17 MED ORDER — NEOSTIGMINE METHYLSULFATE 1 MG/ML IJ SOLN
INTRAMUSCULAR | Status: DC | PRN
  Administered 2012-03-17: 5 mg via INTRAVENOUS

## 2012-03-17 MED ORDER — METOCLOPRAMIDE HCL 5 MG/ML IJ SOLN
10.0000 mg | Freq: Four times a day (QID) | INTRAMUSCULAR | Status: DC
  Administered 2012-03-17 – 2012-03-18 (×3): 10 mg via INTRAVENOUS
  Filled 2012-03-17 (×4): qty 2

## 2012-03-17 MED ORDER — TIOTROPIUM BROMIDE MONOHYDRATE 18 MCG IN CAPS
18.0000 ug | ORAL_CAPSULE | Freq: Every day | RESPIRATORY_TRACT | Status: DC
  Administered 2012-03-18 – 2012-03-21 (×4): 18 ug via RESPIRATORY_TRACT
  Filled 2012-03-17 (×2): qty 5

## 2012-03-17 MED ORDER — GLYCOPYRROLATE 0.2 MG/ML IJ SOLN
INTRAMUSCULAR | Status: DC | PRN
  Administered 2012-03-17: 0.6 mg via INTRAVENOUS

## 2012-03-17 MED ORDER — PHENYLEPHRINE HCL 10 MG/ML IJ SOLN
INTRAMUSCULAR | Status: DC | PRN
  Administered 2012-03-17 (×2): 80 ug via INTRAVENOUS

## 2012-03-17 MED ORDER — MIDAZOLAM HCL 5 MG/5ML IJ SOLN
INTRAMUSCULAR | Status: DC | PRN
  Administered 2012-03-17 (×2): 1 mg via INTRAVENOUS

## 2012-03-17 MED ORDER — ASPIRIN EC 81 MG PO TBEC
81.0000 mg | DELAYED_RELEASE_TABLET | Freq: Every day | ORAL | Status: DC
  Administered 2012-03-18 – 2012-04-04 (×18): 81 mg via ORAL
  Filled 2012-03-17 (×18): qty 1

## 2012-03-17 MED ORDER — 0.9 % SODIUM CHLORIDE (POUR BTL) OPTIME
TOPICAL | Status: DC | PRN
  Administered 2012-03-17: 2000 mL

## 2012-03-17 MED ORDER — DEXTROSE-NACL 5-0.45 % IV SOLN: INTRAVENOUS | Status: DC

## 2012-03-17 MED ORDER — SODIUM CHLORIDE 0.45 % IV SOLN: INTRAVENOUS | Status: DC

## 2012-03-17 MED ORDER — OXYCODONE-ACETAMINOPHEN 5-325 MG PO TABS
1.0000 | ORAL_TABLET | ORAL | Status: DC | PRN
  Administered 2012-03-18 – 2012-03-21 (×6): 2 via ORAL
  Administered 2012-03-21: 1 via ORAL
  Administered 2012-03-22 (×2): 2 via ORAL
  Administered 2012-03-22: 1 via ORAL
  Administered 2012-03-23 – 2012-04-04 (×46): 2 via ORAL
  Filled 2012-03-17 (×31): qty 2
  Filled 2012-03-17: qty 1
  Filled 2012-03-17 (×12): qty 2
  Filled 2012-03-17: qty 1
  Filled 2012-03-17 (×13): qty 2

## 2012-03-17 MED ORDER — BUPIVACAINE ON-Q PAIN PUMP (FOR ORDER SET NO CHG)
INJECTION | Status: AC
  Filled 2012-03-17: qty 1

## 2012-03-17 MED ORDER — GABAPENTIN 300 MG PO CAPS
300.0000 mg | ORAL_CAPSULE | Freq: Three times a day (TID) | ORAL | Status: DC
  Administered 2012-03-17 – 2012-04-01 (×44): 300 mg via ORAL
  Filled 2012-03-17 (×47): qty 1

## 2012-03-17 MED ORDER — VECURONIUM BROMIDE 10 MG IV SOLR
INTRAVENOUS | Status: DC | PRN
  Administered 2012-03-17 (×2): 3 mg via INTRAVENOUS
  Administered 2012-03-17: 1 mg via INTRAVENOUS
  Administered 2012-03-17: 2 mg via INTRAVENOUS

## 2012-03-17 MED ORDER — HYDROMORPHONE HCL PF 1 MG/ML IJ SOLN
0.2500 mg | INTRAMUSCULAR | Status: DC | PRN
  Administered 2012-03-17 (×2): 0.5 mg via INTRAVENOUS

## 2012-03-17 MED ORDER — KCL IN DEXTROSE-NACL 20-5-0.45 MEQ/L-%-% IV SOLN
INTRAVENOUS | Status: AC
  Filled 2012-03-17: qty 1000

## 2012-03-17 MED ORDER — SENNOSIDES-DOCUSATE SODIUM 8.6-50 MG PO TABS
1.0000 | ORAL_TABLET | Freq: Every evening | ORAL | Status: DC | PRN
  Filled 2012-03-17: qty 1

## 2012-03-17 MED ORDER — BUPIVACAINE 0.5 % ON-Q PUMP SINGLE CATH 400 ML
400.0000 mL | INJECTION | Status: DC
  Filled 2012-03-17: qty 400

## 2012-03-17 MED ORDER — SODIUM CHLORIDE 0.9 % IV SOLN
INTRAVENOUS | Status: AC
  Filled 2012-03-17: qty 1

## 2012-03-17 MED ORDER — TRAMADOL HCL 50 MG PO TABS
50.0000 mg | ORAL_TABLET | Freq: Four times a day (QID) | ORAL | Status: DC | PRN
  Administered 2012-03-22 – 2012-03-23 (×2): 50 mg via ORAL
  Administered 2012-03-26: 100 mg via ORAL
  Filled 2012-03-17: qty 1
  Filled 2012-03-17: qty 2
  Filled 2012-03-17: qty 1
  Filled 2012-03-17: qty 2

## 2012-03-17 MED ORDER — ONDANSETRON HCL 4 MG/2ML IJ SOLN
INTRAMUSCULAR | Status: DC | PRN
  Administered 2012-03-17: 4 mg via INTRAVENOUS

## 2012-03-17 MED ORDER — DROPERIDOL 2.5 MG/ML IJ SOLN: 0.6250 mg | INTRAMUSCULAR | Status: DC | PRN

## 2012-03-17 MED ORDER — ACETAMINOPHEN 10 MG/ML IV SOLN
1000.0000 mg | Freq: Four times a day (QID) | INTRAVENOUS | Status: AC
  Administered 2012-03-17 – 2012-03-18 (×4): 1000 mg via INTRAVENOUS
  Filled 2012-03-17 (×3): qty 100

## 2012-03-17 MED ORDER — LACTATED RINGERS IV SOLN
INTRAVENOUS | Status: DC | PRN
Start: 2012-03-17 — End: 2012-03-17
  Administered 2012-03-17 (×2): via INTRAVENOUS

## 2012-03-17 MED ORDER — OXYCODONE HCL 5 MG PO TABS: 5.0000 mg | ORAL_TABLET | ORAL | Status: AC | PRN

## 2012-03-17 MED ORDER — INSULIN GLARGINE 100 UNIT/ML ~~LOC~~ SOLN
40.0000 [IU] | Freq: Two times a day (BID) | SUBCUTANEOUS | Status: DC
  Administered 2012-03-17: 40 [IU] via SUBCUTANEOUS

## 2012-03-17 MED ORDER — INSULIN REGULAR BOLUS VIA INFUSION: 0.0000 [IU] | Freq: Three times a day (TID) | INTRAVENOUS | Status: DC

## 2012-03-17 MED ORDER — NALOXONE HCL 0.4 MG/ML IJ SOLN: 0.4000 mg | INTRAMUSCULAR | Status: DC | PRN

## 2012-03-17 MED ORDER — INSULIN ASPART 100 UNIT/ML ~~LOC~~ SOLN
0.0000 [IU] | SUBCUTANEOUS | Status: DC
  Administered 2012-03-18 (×3): 2 [IU] via SUBCUTANEOUS
  Administered 2012-03-19: 8 [IU] via SUBCUTANEOUS
  Administered 2012-03-19 (×2): 4 [IU] via SUBCUTANEOUS
  Administered 2012-03-19 – 2012-03-20 (×3): 2 [IU] via SUBCUTANEOUS
  Administered 2012-03-20: 8 [IU] via SUBCUTANEOUS
  Administered 2012-03-20: 4 [IU] via SUBCUTANEOUS
  Administered 2012-03-20: 2 [IU] via SUBCUTANEOUS
  Administered 2012-03-20: 8 [IU] via SUBCUTANEOUS
  Administered 2012-03-20: 4 [IU] via SUBCUTANEOUS
  Administered 2012-03-21: 2 [IU] via SUBCUTANEOUS
  Administered 2012-03-21 (×2): 8 [IU] via SUBCUTANEOUS
  Administered 2012-03-21 (×2): 4 [IU] via SUBCUTANEOUS
  Administered 2012-03-21: 8 [IU] via SUBCUTANEOUS
  Administered 2012-03-21: 4 [IU] via SUBCUTANEOUS
  Administered 2012-03-22 (×2): 2 [IU] via SUBCUTANEOUS
  Administered 2012-03-22: 4 [IU] via SUBCUTANEOUS
  Administered 2012-03-23 (×3): 2 [IU] via SUBCUTANEOUS
  Administered 2012-03-24: 4 [IU] via SUBCUTANEOUS
  Administered 2012-03-24: 2 [IU] via SUBCUTANEOUS
  Administered 2012-03-24: 15 [IU] via SUBCUTANEOUS
  Administered 2012-03-25: 4 [IU] via SUBCUTANEOUS
  Administered 2012-03-25: 2 [IU] via SUBCUTANEOUS
  Administered 2012-03-25: 8 [IU] via SUBCUTANEOUS
  Administered 2012-03-25 – 2012-03-27 (×5): 2 [IU] via SUBCUTANEOUS

## 2012-03-17 MED ORDER — PHENYLEPHRINE HCL 10 MG/ML IJ SOLN
10.0000 mg | INTRAVENOUS | Status: DC | PRN
  Administered 2012-03-17: 1 ug/min via INTRAVENOUS

## 2012-03-17 MED ORDER — KCL IN DEXTROSE-NACL 20-5-0.45 MEQ/L-%-% IV SOLN
INTRAVENOUS | Status: DC
  Administered 2012-03-17: 14:00:00 via INTRAVENOUS
  Administered 2012-03-18 (×2): 50 mL via INTRAVENOUS
  Administered 2012-03-19: 20 mL/h via INTRAVENOUS
  Administered 2012-03-21: 01:00:00 via INTRAVENOUS
  Filled 2012-03-17 (×8): qty 1000

## 2012-03-17 MED ORDER — DEXTROSE 5 % IV SOLN
1.5000 g | Freq: Two times a day (BID) | INTRAVENOUS | Status: AC
  Administered 2012-03-17 – 2012-03-18 (×2): 1.5 g via INTRAVENOUS
  Filled 2012-03-17 (×2): qty 1.5

## 2012-03-17 MED ORDER — BUPIVACAINE HCL 0.5 % IJ SOLN
INTRAMUSCULAR | Status: DC | PRN
  Administered 2012-03-17: 400 mL

## 2012-03-17 SURGICAL SUPPLY — 88 items
BLADE SURG 11 STRL SS (BLADE) IMPLANT
CANISTER SUCTION 2500CC (MISCELLANEOUS) ×9 IMPLANT
CATH KIT ON Q 5IN SLV (PAIN MANAGEMENT) ×2 IMPLANT
CATH THORACIC 28FR (CATHETERS) ×2 IMPLANT
CATH THORACIC 36FR (CATHETERS) IMPLANT
CATH THORACIC 36FR RT ANG (CATHETERS) IMPLANT
CLIP TI MEDIUM 24 (CLIP) ×5 IMPLANT
CLOTH BEACON ORANGE TIMEOUT ST (SAFETY) ×6 IMPLANT
CONN ST 1/4X3/8  BEN (MISCELLANEOUS) ×1
CONN ST 1/4X3/8 BEN (MISCELLANEOUS) ×1 IMPLANT
CONN Y 3/8X3/8X3/8  BEN (MISCELLANEOUS) ×1
CONN Y 3/8X3/8X3/8 BEN (MISCELLANEOUS) ×1 IMPLANT
CONT SPEC 4OZ CLIKSEAL STRL BL (MISCELLANEOUS) ×8 IMPLANT
COVER MAYO STAND STRL (DRAPES) ×2 IMPLANT
COVER SURGICAL LIGHT HANDLE (MISCELLANEOUS) ×12 IMPLANT
COVER TABLE BACK 60X90 (DRAPES) ×3 IMPLANT
DRAIN CHANNEL 32F RND 10.7 FF (WOUND CARE) ×2 IMPLANT
DRAPE CAMERA CLOSED 9X96 (DRAPES) IMPLANT
DRAPE INCISE IOBAN 66X45 STRL (DRAPES) ×2 IMPLANT
DRAPE LAPAROSCOPIC ABDOMINAL (DRAPES) ×3 IMPLANT
ELECT REM PT RETURN 9FT ADLT (ELECTROSURGICAL) ×3
ELECTRODE REM PT RTRN 9FT ADLT (ELECTROSURGICAL) ×2 IMPLANT
FORCEPS BIOP RJ4 1.8 (CUTTING FORCEPS) IMPLANT
GLOVE BIO SURGEON STRL SZ 6 (GLOVE) ×6 IMPLANT
GLOVE BIOGEL PI IND STRL 6 (GLOVE) ×2 IMPLANT
GLOVE BIOGEL PI IND STRL 7.0 (GLOVE) ×1 IMPLANT
GLOVE BIOGEL PI INDICATOR 6 (GLOVE) ×2
GLOVE BIOGEL PI INDICATOR 7.0 (GLOVE) ×1
GLOVE EUDERMIC 7 POWDERFREE (GLOVE) ×9 IMPLANT
GLOVE EXAM NITRILE MD LF STRL (GLOVE) ×2 IMPLANT
GOWN PREVENTION PLUS XLARGE (GOWN DISPOSABLE) ×3 IMPLANT
GOWN STRL NON-REIN LRG LVL3 (GOWN DISPOSABLE) ×12 IMPLANT
HANDLE STAPLE ENDO GIA SHORT (STAPLE) ×1
KIT BASIN OR (CUSTOM PROCEDURE TRAY) ×3 IMPLANT
KIT ROOM TURNOVER OR (KITS) ×4 IMPLANT
KIT SUCTION CATH 14FR (SUCTIONS) ×2 IMPLANT
MARKER SKIN DUAL TIP RULER LAB (MISCELLANEOUS) ×3 IMPLANT
NS IRRIG 1000ML POUR BTL (IV SOLUTION) ×7 IMPLANT
OIL SILICONE PENTAX (PARTS (SERVICE/REPAIRS)) ×1 IMPLANT
PACK CHEST (CUSTOM PROCEDURE TRAY) ×3 IMPLANT
PAD ARMBOARD 7.5X6 YLW CONV (MISCELLANEOUS) ×12 IMPLANT
RELOAD EGIA 60 MED/THCK PURPLE (STAPLE) ×9 IMPLANT
RELOAD EGIA TRIS TAN 45 CVD (STAPLE) ×9 IMPLANT
RELOAD STAPLE 45 TAN MED CVD (STAPLE) IMPLANT
RELOAD STAPLE 60 BLK XTHK ART (STAPLE) IMPLANT
RELOAD STAPLE 60 MED/THCK ART (STAPLE) IMPLANT
RELOAD TRI 2.0 60 XTHK VAS SUL (STAPLE) ×12 IMPLANT
SEALANT PROGEL (MISCELLANEOUS) ×2 IMPLANT
SEALANT SURG COSEAL 4ML (VASCULAR PRODUCTS) IMPLANT
SEALANT SURG COSEAL 8ML (VASCULAR PRODUCTS) IMPLANT
SOLUTION ANTI FOG 6CC (MISCELLANEOUS) ×1 IMPLANT
SPECIMEN JAR MEDIUM (MISCELLANEOUS) ×3 IMPLANT
SPONGE GAUZE 4X4 12PLY (GAUZE/BANDAGES/DRESSINGS) ×6 IMPLANT
SPONGE INTESTINAL PEANUT (DISPOSABLE) ×2 IMPLANT
STAPLER ENDO GIA 12 SHRT THIN (STAPLE) IMPLANT
STAPLER ENDO GIA 12MM SHORT (STAPLE) ×2 IMPLANT
STAPLER TA30 4.8 NON-ABS (STAPLE) ×2 IMPLANT
SUT PROLENE 3 0 SH DA (SUTURE) IMPLANT
SUT PROLENE 4 0 RB 1 (SUTURE)
SUT PROLENE 4-0 RB1 .5 CRCL 36 (SUTURE) IMPLANT
SUT SILK  1 MH (SUTURE) ×2
SUT SILK 1 MH (SUTURE) ×4 IMPLANT
SUT SILK 2 0 SH (SUTURE) ×1 IMPLANT
SUT SILK 2 0SH CR/8 30 (SUTURE) ×2 IMPLANT
SUT SILK 3 0 SH CR/8 (SUTURE) ×2 IMPLANT
SUT VIC AB 1 CTX 36 (SUTURE) ×9
SUT VIC AB 1 CTX36XBRD ANBCTR (SUTURE) ×5 IMPLANT
SUT VIC AB 2 TP1 27 (SUTURE) ×7 IMPLANT
SUT VIC AB 2-0 CT1 27 (SUTURE)
SUT VIC AB 2-0 CT1 TAPERPNT 27 (SUTURE) IMPLANT
SUT VIC AB 2-0 CTX 27 (SUTURE) ×2 IMPLANT
SUT VIC AB 2-0 CTX 36 (SUTURE) ×4 IMPLANT
SUT VIC AB 3-0 SH 27 (SUTURE) ×3
SUT VIC AB 3-0 SH 27X BRD (SUTURE) ×1 IMPLANT
SUT VIC AB 3-0 X1 27 (SUTURE) ×6 IMPLANT
SYR 20ML ECCENTRIC (SYRINGE) ×3 IMPLANT
SYR 5ML LUER SLIP (SYRINGE) ×3 IMPLANT
SYR CONTROL 10ML LL (SYRINGE) IMPLANT
SYSTEM SAHARA CHEST DRAIN ATS (WOUND CARE) ×3 IMPLANT
TAPE UMBILICAL 1/8 X36 TWILL (MISCELLANEOUS) ×2 IMPLANT
TIP APPLICATOR SPRAY EXTEND 16 (VASCULAR PRODUCTS) ×2 IMPLANT
TOWEL OR 17X24 6PK STRL BLUE (TOWEL DISPOSABLE) ×6 IMPLANT
TOWEL OR 17X26 10 PK STRL BLUE (TOWEL DISPOSABLE) ×3 IMPLANT
TRAP SPECIMEN MUCOUS 40CC (MISCELLANEOUS) ×3 IMPLANT
TRAY FOLEY CATH 14FRSI W/METER (CATHETERS) ×3 IMPLANT
TUBE CONNECTING 12X1/4 (SUCTIONS) ×5 IMPLANT
TUNNELER SHEATH ON-Q 11GX8 (MISCELLANEOUS) ×2 IMPLANT
WATER STERILE IRR 1000ML POUR (IV SOLUTION) ×9 IMPLANT

## 2012-03-17 NOTE — Brief Op Note (Addendum)
03/17/2012  12:13 PM  PATIENT:  [name]  61 y.o. male  PRE-OPERATIVE DIAGNOSIS:  RIGHT UPPER LOBE LUNG MASS  POST-OPERATIVE DIAGNOSIS:  RIGHT UPPER LOBE LUNG MASS  PROCEDURE:  Procedure(s):  RIGHT THORACOTOMY, WEDGE RESECTION OF RIGHT UPPER LOBE LESIONS WITH FROZEN SECTION,RIGHT UPPER LOBECTOMY, LYMPH NODE DISSECTION  SURGEON:  Surgeon(s): Alleen Borne, MD  ASSISTANT: Coral Ceo, PA-C  ANESTHESIA:   general  SPECIMEN:  Source of Specimen:  Right upper lobe  DISPOSITION OF SPECIMEN:  Pathology  DRAINS: 28 Fr, 32 Fr CTs  PATIENT CONDITION:  PACU - hemodynamically stable.    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

## 2012-03-17 NOTE — Anesthesia Postprocedure Evaluation (Signed)
  Anesthesia Post-op Note  Patient: [name]  Procedure(s) Performed: Procedure(s) (LRB): THORACOTOMY/LOBECTOMY (Right)  Patient Location: PACU  Anesthesia Type: General  Level of Consciousness: awake  Airway and Oxygen Therapy: Patient Spontanous Breathing  Post-op Pain: mild  Post-op Assessment: Post-op Vital signs reviewed, Patient's Cardiovascular Status Stable, Respiratory Function Stable, Patent Airway, No signs of Nausea or vomiting and Pain level controlled  Post-op Vital Signs: stable  Complications: No apparent anesthesia complications

## 2012-03-17 NOTE — Preoperative (Signed)
Beta Blockers   Reason not to administer Beta Blockers:Not Applicable 

## 2012-03-17 NOTE — Transfer of Care (Signed)
Immediate Anesthesia Transfer of Care Note  Patient: [name]  Procedure(s) Performed: Procedure(s) (LRB): THORACOTOMY/LOBECTOMY (Right)  Patient Location: PACU  Anesthesia Type: General  Level of Consciousness: awake  Airway & Oxygen Therapy: Patient Spontanous Breathing and Patient connected to face mask oxygen  Post-op Assessment: Report given to PACU RN and Post -op Vital signs reviewed and stable  Post vital signs: Reviewed and stable  Complications: No apparent anesthesia complications

## 2012-03-17 NOTE — Anesthesia Preprocedure Evaluation (Addendum)
Anesthesia Evaluation  Patient identified by MRN, date of birth, ID band Patient awake    Reviewed: Allergy & Precautions, H&P , NPO status , Patient's Chart, lab work & pertinent test results, reviewed documented beta blocker date and time   Airway Mallampati: II TM Distance: >3 FB Neck ROM: Full    Dental  (+) Edentulous Upper and Edentulous Lower   Pulmonary shortness of breath, with exertion and lying, asthma , sleep apnea and Continuous Positive Airway Pressure Ventilation , pneumonia , COPD COPD inhaler,    Pulmonary exam normal       Cardiovascular Exercise Tolerance: Poor hypertension, Pt. on medications and Pt. on home beta blockers + CAD, + Cardiac Stents and + DOE Rhythm:Regular Rate:Normal     Neuro/Psych  Headaches, Depression    GI/Hepatic hiatal hernia, GERD-  ,  Endo/Other  Diabetes mellitus-, Type 2, Insulin Dependent  Renal/GU      Musculoskeletal   Abdominal   Peds  Hematology   Anesthesia Other Findings   Reproductive/Obstetrics                          Anesthesia Physical Anesthesia Plan  ASA: III  Anesthesia Plan: General   Post-op Pain Management:    Induction: Intravenous  Airway Management Planned: Double Lumen EBT and Oral ETT  Additional Equipment: Arterial line and CVP  Intra-op Plan:   Post-operative Plan: Extubation in OR  Informed Consent: I have reviewed the patients History and Physical, chart, labs and discussed the procedure including the risks, benefits and alternatives for the proposed anesthesia with the patient or authorized representative who has indicated his/her understanding and acceptance.   Dental advisory given  Plan Discussed with: CRNA, Anesthesiologist and Surgeon  Anesthesia Plan Comments:         Anesthesia Quick Evaluation

## 2012-03-17 NOTE — Progress Notes (Signed)
Patient ID: MOSES ODOHERTY, male   DOB: [birth year], 61 y.o.   MRN: [id number]                   [address]            [name] [address]          [phone]     Day of Surgery Procedure(s) (LRB): THORACOTOMY/LOBECTOMY (Right)  Total Length of Stay:  LOS: 0 days  BP 107/53  Pulse 82  Temp(Src) 97.9 F (36.6 C) (Oral)  Resp 13  SpO2 94%     . sodium chloride    . bupivacaine ON-Q pain pump    . dextrose 5 % and 0.45 % NaCl with KCl 20 mEq/L 125 mL/hr at 03/17/12 1600  . dextrose 5 % and 0.45% NaCl 125 mL/hr at 03/17/12 1345  . insulin (NOVOLIN-R) infusion 6.5 mL/hr at 03/17/12 1820      Extubated No air leak   Delight Ovens MD  Beeper [phone] Office [phone] 03/17/2012 6:56 PM

## 2012-03-17 NOTE — Op Note (Signed)
NAMETRAYCEN, GOYER             ACCOUNT NO.:  [id number]  MEDICAL RECORD NO.:  [id number]  LOCATION:  2306                         FACILITY:  [facility]  PHYSICIAN:  Evelene Croon, M.D.     DATE OF BIRTH:  Jul 19, 1951  DATE OF PROCEDURE:  03/17/2012 DATE OF DISCHARGE:                              OPERATIVE REPORT   PREOPERATIVE DIAGNOSIS:  Right upper lobe lung nodule.  OPERATIVE PROCEDURE:  Right thoracotomy, wedge resection of right upper lobe lung nodules with frozen section, right upper lobectomy and mediastinal lymph node dissection.  ATTENDING SURGEON:  Evelene Croon, M.D.  ASSISTANT:  Coral Ceo, PA-C.  ANESTHESIA:  General endotracheal.  CLINICAL HISTORY:  This patient is a 61 year old, obese, smoker with COPD and sleep apnea, who reports about a 99-month history of exertional dyspnea.  He was followed by Dr. Marchelle Gearing.  He was having some right- sided chest wall pain and had a CT scan of the chest done in February that showed a 1.4 x 1.3 cm irregular cavitary nodule in the posterior segment of the right upper lobe.  PET scan was performed, which showed mild hypermetabolic activity within this nodule with a maximum SUV of 2.6.  There was also a smaller nodule immediately adjacent to this cavitary lesion that was seen on CT scan, but it was not possible to determine whether this smaller pulmonary nodule had hypermetabolic activity or not.  There were no hypermetabolic lymph nodes seen.  There were no other areas of hypermetabolic uptake within the abdomen or skeleton.  He underwent a repeat CT scan of the chest on February 28, 2012, showed an increase in the size of the cavitary nodule to 1.6 x 1.8 cm. The wall was slightly thicker and more irregular compared to the prior scan in February.  Adjacent nodule was about 9 mm and was stable in size.  There were multiple scattered areas of peribronchovascular ground- glass opacity bilaterally that were new from prior scan.  I did  review of his old scans from 2008 to 2009 showed that the lesion was not present at that time.  After review of the scans and examination of the patient, it was felt that the best option was to proceed with surgical resection.  I felt that we could probably perform a wedge resection with frozen section diagnosed in the operating room and proceed with lobectomy if indicated.  I discussed the option of doing a CT-guided needle biopsy with the patient, although I felt that it was unlikely to be useful given the size of the lesion and had very thick chest wall. He was in agreement with that and wanted to proceed ahead with surgery. Preoperative pulmonary function testing was performed and showed an FEV1 of 2.2, which was 59% predicted and FVC of 2.8, which was 57% predicted. Diffusion capacity was 55% predicted.  He was tested for desaturation and did not desaturate after 3 laps of 185 feet.  I felt the patient would tolerate a right upper lobectomy and he was in agreement with proceeding with surgery.  We discussed alternatives, benefits, and risks including, but not limited to bleeding, blood transfusion, infection, bronchial stump complications, prolonged air leak, respiratory failure, organ dysfunction,  and death.  He understood and had no further questions and wanted to proceed.  OPERATIVE PROCEDURE:  The patient was seen in the preoperative holding area.  The chart was reviewed, H and P dated, and consent signed.  CT scans were reviewed and the right side of the chest was signed by me. He was taken back to the operating room and placed on table in supine position.  I did not perform flexible fiberoptic bronchoscopy since this was a peripheral lesion and there were no signs of any airway compromise.  Then the patient was placed under general endotracheal anesthesia with a double-lumen tube by Anesthesiology.  The lower extremity sequential compression devices were used.  A Foley  catheter was placed in the bladder using sterile technique.  Then, the patient was positioned in the left lateral decubitus position with the right side up.  The right-sided chest was prepped with Betadine soap and solution, draped in usual sterile manner.  Time-out was taken.  Proper patient, proper operation, proper operative side were confirmed by nursing and anesthesia staff after reviewing the CT scan again in the operating room.  Then the right chest was entered through a posterolateral thoracotomy incision.  Given his thick chest wall, I did not feel it would be suitable for a muscle-sparing thoracotomy.  The pleural space was entered through the 5th intercostal space. Examination of the pleural space showed no lesions.  There were no effusion.  There were some adhesions present between the upper, middle, and lower lobes with the chest wall, and these were divided easily. Then the right upper lobe was palpated and the 2 lesions were palpated in the posterior segment of the upper lobe.  These were not immediately peripheral, but were suitable for wedge resection with what felt like grossly adequate margin.  Then a wedge resection was performed using a linear staplers.  This specimen was then sent to Pathology for frozen section.  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  Therefore, I felt it would be best to proceed ahead with a right upper lobectomy.  Then the lung was retracted laterally and the mediastinal pleura divided over the hilum of the lung.  The phrenic nerve was identified and carefully avoided.  The right upper lobe pulmonary artery branches were identified and encircled with tapes and divided using a vascular staplers.  There were 2 large branches to the right upper lobe immediately visible.  Then, the right superior pulmonary vein was identified.  The lower most portion of this vein went to the middle  lobe vein and was preserved.  The remainder of the vein was encircled with a tape and divided using a vascular stapler.  Then attention was turned to the major fissure.  This was opened and the interlobar portion of the pulmonary artery was identified.  This was traced proximally and that was a third branch to the right upper lobe, which was fairly short. This was tied with a 2-0 silk tie and suture ligated and divided.  Then the fissure between the upper and middle, upper and lower lobe were divided using a linear staplers.  The lung was then retracted anteriorly and the right upper lobe bronchus identified.  There were multiple lymph nodes around this and these were taken with the specimen.  The upper lobe bronchus was encircled with a tape and then encircled with a TL 30 linear stapler with 4.8 mm staples.  The stapler was closed.  The right lung was then inflated, the middle and lower lobes inflated without difficulty.  The stapler was fired and the right upper lobe bronchus divided distal to the stapler.  Staple line appeared grossly intact. The specimen was passed off table and sent to Pathology.  There was another moderate-sized hilar node which was sent as a separate specimen. There was also a large R10 lymph node, which was sent as a separate specimen.  I examined the right lower paratracheal region and I did not see any lymph nodes.  I also examined station 7 in the subcarinal region and did not see any lymph nodes to biopsy there.  Then, the chest was filled with warm saline solution and the bronchial stump tested at 30 cm of water pressure and there were no air leaks.  ProGel was used to coat the stapler lines.  The middle lobe was then packed to the lower lobe using two 3-0 Vicryl interrupted sutures.  The inferior pulmonary ligament was divided using electrocautery.  Then, ON-Q catheter was brought through a separate stab incision and positioned in a subpleural location  posterior to the thoracotomy incision.  This was connected to a pain ball with 0.25% Marcaine local anesthesia.  Two chest tubes were placed with a 28-French straight tube anteriorly and a 32-French soft Blake drain positioned posteriorly and at the base.  Then the ribs were reapproximated with number 2 Vicryl pericostal sutures.  The lung was inflated before tying the sutures.  Then, the muscles were closed in layer using continuous #1 Vicryl suture.  The subcutaneous tissue was closed with continuous 2-0 Vicryl and the skin with a 3-0 Vicryl subcuticular closure.  The sponge, needle, instrument counts were correct according to the scrub nurse.  Dry sterile dressing applied over the incision and around the chest tubes, which were hooked to Pleur-Evac suction. The patient remained hemodynamically stable and was then turned in supine position, extubated, and transported to the PostAnesthesia Care Unit in satisfactory stable condition.     Evelene Croon, M.D.     BB/MEDQ  D:  03/17/2012  T:  03/17/2012  Job:  [id number]

## 2012-03-18 ENCOUNTER — Encounter (HOSPITAL_COMMUNITY): Payer: Self-pay | Admitting: Certified Registered Nurse Anesthetist

## 2012-03-18 ENCOUNTER — Inpatient Hospital Stay (HOSPITAL_COMMUNITY): Payer: Managed Care, Other (non HMO)

## 2012-03-18 DIAGNOSIS — E1165 Type 2 diabetes mellitus with hyperglycemia: Secondary | ICD-10-CM

## 2012-03-18 LAB — GLUCOSE, CAPILLARY
Glucose-Capillary: 100 mg/dL — ABNORMAL HIGH (ref 70–99)
Glucose-Capillary: 112 mg/dL — ABNORMAL HIGH (ref 70–99)
Glucose-Capillary: 114 mg/dL — ABNORMAL HIGH (ref 70–99)
Glucose-Capillary: 95 mg/dL (ref 70–99)
Glucose-Capillary: 100 mg/dL — ABNORMAL HIGH (ref 70–99)
Glucose-Capillary: 106 mg/dL — ABNORMAL HIGH (ref 70–99)
Glucose-Capillary: 149 mg/dL — ABNORMAL HIGH (ref 70–99)

## 2012-03-18 LAB — TYPE AND SCREEN
ABO/RH(D): O POS
Unit division: 0

## 2012-03-18 LAB — POCT I-STAT 3, ART BLOOD GAS (G3+)
Acid-Base Excess: 2 mmol/L (ref 0.0–2.0)
Patient temperature: 98.6
pH, Arterial: 7.368 (ref 7.350–7.450)

## 2012-03-18 LAB — CBC
MCH: 31 pg (ref 26.0–34.0)
Platelets: 234 10*3/uL (ref 150–400)
RBC: 3.74 MIL/uL — ABNORMAL LOW (ref 4.22–5.81)
WBC: 14.9 10*3/uL — ABNORMAL HIGH (ref 4.0–10.5)

## 2012-03-18 LAB — BASIC METABOLIC PANEL
CO2: 27 mEq/L (ref 19–32)
Calcium: 5.9 mg/dL — CL (ref 8.4–10.5)
GFR calc Af Amer: >90 mL/min (ref >90)
Sodium: 138 mEq/L (ref 135–145)

## 2012-03-18 MED ORDER — ALBUTEROL SULFATE HFA 108 (90 BASE) MCG/ACT IN AERS
2.0000 | INHALATION_SPRAY | RESPIRATORY_TRACT | Status: DC | PRN
  Administered 2012-03-18: 2 via RESPIRATORY_TRACT
  Filled 2012-03-18: qty 6.7

## 2012-03-18 MED ORDER — CALCIUM CARBONATE 1250 (500 CA) MG PO TABS
1.0000 | ORAL_TABLET | Freq: Two times a day (BID) | ORAL | Status: DC
  Administered 2012-03-18 – 2012-04-04 (×36): 500 mg via ORAL
  Filled 2012-03-18 (×40): qty 1

## 2012-03-18 MED ORDER — INSULIN GLARGINE 100 UNIT/ML ~~LOC~~ SOLN
60.0000 [IU] | Freq: Two times a day (BID) | SUBCUTANEOUS | Status: DC
  Administered 2012-03-18 – 2012-03-20 (×6): 60 [IU] via SUBCUTANEOUS

## 2012-03-18 MED ORDER — POTASSIUM CHLORIDE CRYS ER 20 MEQ PO TBCR
40.0000 meq | EXTENDED_RELEASE_TABLET | Freq: Every day | ORAL | Status: DC
  Administered 2012-03-18: 40 meq via ORAL
  Filled 2012-03-18 (×2): qty 2

## 2012-03-18 MED ORDER — POTASSIUM CHLORIDE 10 MEQ/50ML IV SOLN
10.0000 meq | INTRAVENOUS | Status: AC | PRN
  Administered 2012-03-18 (×3): 10 meq via INTRAVENOUS
  Filled 2012-03-18 (×2): qty 50

## 2012-03-18 MED ORDER — CALCIUM CARBONATE 1250 (500 CA) MG PO TABS: 1.0000 | ORAL_TABLET | Freq: Every day | ORAL | Status: DC

## 2012-03-18 MED ORDER — INSULIN ASPART 100 UNIT/ML ~~LOC~~ SOLN
10.0000 [IU] | Freq: Three times a day (TID) | SUBCUTANEOUS | Status: DC
  Administered 2012-03-18 – 2012-03-20 (×8): 10 [IU] via SUBCUTANEOUS

## 2012-03-18 MED ORDER — METOCLOPRAMIDE HCL 10 MG PO TABS
10.0000 mg | ORAL_TABLET | Freq: Three times a day (TID) | ORAL | Status: DC
  Administered 2012-03-18 – 2012-04-01 (×42): 10 mg via ORAL
  Filled 2012-03-18 (×49): qty 1

## 2012-03-18 NOTE — Progress Notes (Addendum)
CRITICAL VALUE ALERT  Critical value received:  Calcium  Date of notification:  03/18/12  Time of notification:  0539  Critical value read back:yes  Nurse who received alert:  Wilhelmina Mcardle, RN  MD notified (1st page):  Dr. Tyrone Sage  Time of first page:  0540  MD notified (2nd page):  Time of second page:  Responding MD:  Dr. Tyrone Sage  Time MD responded:  [phone]

## 2012-03-18 NOTE — Progress Notes (Signed)
Patient ID: [name], male   DOB: [birth year], 61 y.o.   MRN: [id number] POD # 1 RUL Resting comfortably BP 124/74  Pulse 105  Temp(Src) 98.4 F (36.9 C) (Oral)  Resp 16  Wt 304 lb 3.8 oz (138 kg)  SpO2 92%  Intake/Output Summary (Last 24 hours) at 03/18/12 1924 Last data filed at 03/18/12 1900  Gross per 24 hour  Intake 3627.01 ml  Output   1000 ml  Net 2627.01 ml   Has not voided since foley d/c bladder scan- 200 cc Will give until midnight if no void by then replace foley catheter

## 2012-03-18 NOTE — Progress Notes (Signed)
1 Day Post-Op Procedure(s) (LRB): THORACOTOMY/LOBECTOMY (Right) Subjective: No complaints  Objective: Vital signs in last 24 hours: Temp:  [97.2 F (36.2 C)-98.6 F (37 C)] 98 F (36.7 C) (05/14 0753) Pulse Rate:  [76-109] 94  (05/14 0800) Cardiac Rhythm:  [-] Normal sinus rhythm (05/14 0800) Resp:  [12-20] 17  (05/14 0800) BP: (97-136)/(49-90) 102/49 mmHg (05/14 0800) SpO2:  [90 %-96 %] 93 % (05/14 0800) Arterial Line BP: (84-142)/(41-64) 99/48 mmHg (05/14 0800) Weight:  [138 kg (304 lb 3.8 oz)] 138 kg (304 lb 3.8 oz) (05/14 0600)  Hemodynamic parameters for last 24 hours:    Intake/Output from previous day: 05/13 0701 - 05/14 0700 In: 5275.7 [P.O.:630; I.V.:3989.7; IV Piggyback:656] Out: 1900 [Urine:1240; Blood:200; Chest Tube:460] Intake/Output this shift: Total I/O In: 371.3 [P.O.:150; I.V.:121.3; IV Piggyback:100] Out: 50 [Urine:20; Chest Tube:30]  General appearance: alert and cooperative Neurologic: intact Heart: regular rate and rhythm, S1, S2 normal, no murmur, click, rub or gallop Lungs: clear to auscultation bilaterally Extremities: extremities normal, atraumatic, no cyanosis or edema no air leak from chest tube  Lab Results:  Basename 03/18/12 0429 03/17/12 0658  WBC 14.9* --  HGB 11.6* 11.2*  HCT 33.5* 33.0*  PLT 234 --   BMET:  Basename 03/18/12 0429 03/17/12 1758 03/17/12 0658  NA 138 -- 142  K 3.2* 3.1* --  CL 98 -- --  CO2 27 -- --  GLUCOSE 110* -- 178*  BUN 12 -- --  CREATININE 0.96 -- --  CALCIUM 5.9* -- --    PT/INR: No results found for this basename: LABPROT,INR in the last 72 hours ABG    Component Value Date/Time   PHART 7.368 03/18/2012 0421   HCO3 27.4* 03/18/2012 0421   TCO2 29 03/18/2012 0421   ACIDBASEDEF 2.0 01/05/2009 1120   O2SAT 91.0 03/18/2012 0421   CBG (last 3)   Basename 03/18/12 0752 03/18/12 0421 03/18/12 0325  GLUCAP 100* 114* 112*    Assessment/Plan: S/P Procedure(s) (LRB): THORACOTOMY/LOBECTOMY  (Right) Mobilize Diabetes control:  Still on insulin drip.  Increase Lantus to 60 bid and add meal coverage Novolog.  He was on large amounts of insulin and glucophage preop. Hypokalemia: replace Hypocalcemia: replace Work on IS   LOS: 1 day    Dorena Dorfman K 03/18/2012

## 2012-03-18 NOTE — Progress Notes (Signed)
Dr Dorris Fetch aware of pt yet to void. Bladder was scanned with . Order received to give patient till 12am tonight to void.

## 2012-03-18 NOTE — Care Management Note (Signed)
    Page 1 of 2   04/04/2012     2:27:45 PM   CARE MANAGEMENT NOTE 04/04/2012  Patient:  [name]   Account Number:  [id number]  Date Initiated:  03/18/2012  Documentation initiated by:  Faye Strohman  Subjective/Objective Assessment:   PT S/P RT THORACOTOMY AND LOBECTOMY ON 03/17/12.  PTA, PT INDEPENDENT, LIVES WITH SPOUSE.  PT IS DISABLED, AND IS ON CHRONIC OXYGEN THERAPY AT HOME, PROVIDED BY [facility].     Action/Plan:   MET WITH PT AND WIFE TO DISCUSS HOME NEEDS.  PT HAS CANE, BIPAP, AND SCOOTER AT HOME.  WIFE WORKS, BUT WILL BE ABLE TO TAKE TIME OFF WHEN PT DISCHARGED.  WILL FOLLOW FOR HOME NEEDS AS PT PROGRESSES.   Anticipated DC Date:  04/04/2012   Anticipated DC Plan:  HOME W HOME HEALTH SERVICES      DC Planning Services  CM consult      Choice offered to / List presented to:             Status of service:  Completed, signed off Medicare Important Message given?   (If response is "NO", the following Medicare IM given date fields will be blank) Date Medicare IM given:   Date Additional Medicare IM given:    Discharge Disposition:  HOME/SELF CARE  Per UR Regulation:  Reviewed for med. necessity/level of care/duration of stay  If discussed at Long Length of Stay Meetings, dates discussed:   03/26/2012  04/02/2012    Comments:  04/04/12 Senia Even,RN,BSN 1420 POSSIBLE DISCHARGE LATER TODAY; PT STATES HE FEELS HE DOESN'T NEED ANYTHING AT HOME.  HE HAS RW, WHEELCHAIR AND HOME O2.  WIFE TO BRING PORTABLE O2 TANK WHEN SHE COMES TO PICK PT UP AT DISCHARGE.  04/02/12 Herrick Hartog,RN,BSN 1200 PT PROGRESSING WELL, DESPITE ACUTE RENAL FAILURE AND INCREASED OXYGEN REQUIREMENT.  WILL CONT TO FOLLOW FOR HOME NEEDS AS PT PROGRESSES.  PT STILL PLANS TO DC HOME WITH WIFE AND LIKELY HH AT DC.  03/28/12 1540 Henrietta Mayo RN MSN CCM Progressing well with ambulation, ambulated 300 feet, but continues to desat - 86% documented.  Recovering more quickly per  RN.  03/26/12 1400 Henrietta Mayo RN MSN CCM Received message from CARMEN RASHID @ [employer] : CONTACT FOR DCP ONLY.  # [phone]  03/25/12 1600 Henrietta Mayo RN MSN CCM Met @ bedside with pt and spouse.  Spouse has questions about ST-SNF for rehab, thinks pt may be too debilitated to d/c home.  Pt would prefer to d/c home with home health but understands that rehab may be needed first.  Janet Decesare,RN,BSN [phone]  carmen rashid at [facility] for dc planning needs only [phone]

## 2012-03-19 ENCOUNTER — Inpatient Hospital Stay (HOSPITAL_COMMUNITY): Payer: Managed Care, Other (non HMO)

## 2012-03-19 DIAGNOSIS — J9600 Acute respiratory failure, unspecified whether with hypoxia or hypercapnia: Secondary | ICD-10-CM

## 2012-03-19 DIAGNOSIS — J441 Chronic obstructive pulmonary disease with (acute) exacerbation: Secondary | ICD-10-CM

## 2012-03-19 DIAGNOSIS — G4730 Sleep apnea, unspecified: Secondary | ICD-10-CM

## 2012-03-19 DIAGNOSIS — D381 Neoplasm of uncertain behavior of trachea, bronchus and lung: Secondary | ICD-10-CM

## 2012-03-19 LAB — GLUCOSE, CAPILLARY
Glucose-Capillary: 137 mg/dL — ABNORMAL HIGH (ref 70–99)
Glucose-Capillary: 224 mg/dL — ABNORMAL HIGH (ref 70–99)
Glucose-Capillary: 183 mg/dL — ABNORMAL HIGH (ref 70–99)
Glucose-Capillary: 154 mg/dL — ABNORMAL HIGH (ref 70–99)

## 2012-03-19 LAB — CARDIAC PANEL(CRET KIN+CKTOT+MB+TROPI)
Relative Index: 0.2 (ref 0.0–2.5)
Troponin I: <0.3 ng/mL (ref <0.30)

## 2012-03-19 LAB — COMPREHENSIVE METABOLIC PANEL
ALT: 34 U/L (ref 0–53)
AST: 79 U/L — ABNORMAL HIGH (ref 0–37)
CO2: 26 mEq/L (ref 19–32)
Chloride: 97 mEq/L (ref 96–112)
GFR calc non Af Amer: 87 mL/min — ABNORMAL LOW (ref >90)
Sodium: 136 mEq/L (ref 135–145)
Total Bilirubin: 0.6 mg/dL (ref 0.3–1.2)

## 2012-03-19 LAB — CBC
Platelets: 254 10*3/uL (ref 150–400)
RBC: 3.84 MIL/uL — ABNORMAL LOW (ref 4.22–5.81)
WBC: 20 10*3/uL — ABNORMAL HIGH (ref 4.0–10.5)

## 2012-03-19 MED ORDER — VANCOMYCIN HCL IN DEXTROSE 1-5 GM/200ML-% IV SOLN
1000.0000 mg | Freq: Two times a day (BID) | INTRAVENOUS | Status: DC
  Administered 2012-03-19 – 2012-03-20 (×3): 1000 mg via INTRAVENOUS
  Filled 2012-03-19 (×5): qty 200

## 2012-03-19 MED ORDER — PIPERACILLIN-TAZOBACTAM 3.375 G IVPB
3.3750 g | Freq: Three times a day (TID) | INTRAVENOUS | Status: DC
  Administered 2012-03-19 – 2012-03-28 (×27): 3.375 g via INTRAVENOUS
  Filled 2012-03-19 (×28): qty 50

## 2012-03-19 MED ORDER — VANCOMYCIN HCL 1000 MG IV SOLR
2000.0000 mg | Freq: Once | INTRAVENOUS | Status: AC
  Administered 2012-03-19: 2000 mg via INTRAVENOUS
  Filled 2012-03-19: qty 2000

## 2012-03-19 MED ORDER — DILTIAZEM HCL 30 MG PO TABS
30.0000 mg | ORAL_TABLET | Freq: Four times a day (QID) | ORAL | Status: DC
  Administered 2012-03-19 – 2012-04-04 (×65): 30 mg via ORAL
  Filled 2012-03-19 (×69): qty 1

## 2012-03-19 MED ORDER — LEVALBUTEROL HCL 0.63 MG/3ML IN NEBU
0.6300 mg | INHALATION_SOLUTION | Freq: Four times a day (QID) | RESPIRATORY_TRACT | Status: DC
  Administered 2012-03-19 – 2012-03-23 (×19): 0.63 mg via RESPIRATORY_TRACT
  Filled 2012-03-19 (×23): qty 3

## 2012-03-19 MED ORDER — FUROSEMIDE 10 MG/ML IJ SOLN
80.0000 mg | Freq: Once | INTRAMUSCULAR | Status: AC
  Administered 2012-03-19: 80 mg via INTRAVENOUS
  Filled 2012-03-19: qty 8

## 2012-03-19 MED ORDER — ENOXAPARIN SODIUM 40 MG/0.4ML ~~LOC~~ SOLN
40.0000 mg | SUBCUTANEOUS | Status: DC
  Administered 2012-03-19 – 2012-03-23 (×5): 40 mg via SUBCUTANEOUS
  Filled 2012-03-19 (×6): qty 0.4

## 2012-03-19 MED ORDER — POTASSIUM CHLORIDE CRYS ER 20 MEQ PO TBCR
40.0000 meq | EXTENDED_RELEASE_TABLET | Freq: Two times a day (BID) | ORAL | Status: DC
  Administered 2012-03-19 (×2): 40 meq via ORAL
  Filled 2012-03-19 (×3): qty 2

## 2012-03-19 NOTE — Progress Notes (Signed)
R Anterior chest tube removed per order. Pt instructed to take a deep breath in and hold it during removal. Vaseline, occlusive dressing applied during and after removal. Pt tolerated well. Will continue to monitor.

## 2012-03-19 NOTE — Progress Notes (Signed)
2 Days Post-Op Procedure(s) (LRB): THORACOTOMY/LOBECTOMY (Right) Subjective: Breathing feels tight.  Pain well-controlled  Objective: Vital signs in last 24 hours: Temp:  [98.1 F (36.7 C)-101.1 F (38.4 C)] 98.6 F (37 C) (05/15 0742) Pulse Rate:  [93-133] 117  (05/15 0900) Cardiac Rhythm:  [-] Sinus tachycardia (05/15 0800) Resp:  [13-25] 19  (05/15 0900) BP: (100-139)/(58-76) 139/71 mmHg (05/15 0900) SpO2:  [90 %-96 %] 92 % (05/15 0902) Weight:  [138.3 kg (304 lb 14.3 oz)] 138.3 kg (304 lb 14.3 oz) (05/15 0400)  Hemodynamic parameters for last 24 hours:    Intake/Output from previous day: 05/14 0701 - 05/15 0700 In: 2065 [P.O.:660; I.V.:1255; IV Piggyback:150] Out: 985 [Urine:595; Chest Tube:390] Intake/Output this shift:    General appearance: alert and cooperative Heart: regular rate and rhythm, S1, S2 normal, no murmur, click, rub or gallop Lungs: wheezes bilaterally Extremities: edema mild in legs Wound: dressing dry No air leak from chest tube Lab Results:  Basename 03/19/12 0400 03/18/12 0429  WBC 20.0* 14.9*  HGB 11.4* 11.6*  HCT 34.1* 33.5*  PLT 254 234   BMET:  Basename 03/19/12 0400 03/18/12 0429  NA 136 138  K 3.7 3.2*  CL 97 98  CO2 26 27  GLUCOSE 136* 110*  BUN 16 12  CREATININE 0.96 0.96  CALCIUM 6.6* 5.9*    PT/INR: No results found for this basename: LABPROT,INR in the last 72 hours ABG    Component Value Date/Time   PHART 7.368 03/18/2012 0421   HCO3 27.4* 03/18/2012 0421   TCO2 29 03/18/2012 0421   ACIDBASEDEF 2.0 01/05/2009 1120   O2SAT 91.0 03/18/2012 0421   CBG (last 3)   Basename 03/19/12 0744 03/19/12 0341 03/18/12 2350  GLUCAP 171* 137* 132*    Assessment/Plan: S/P Procedure(s) (LRB): THORACOTOMY/LOBECTOMY (Right) He had fever to 101 last pm and has new bilateral infiltrates,particularly in LLL, new leukocytosis to 20K. He may be getting pneumonia.  Will start vanc and zosyn, send sputum culture.  He may have some volume  excess from surgery.  Will diurese this am and replace K+  DC anterior chest tube.  Tachycardia: I think this is secondary to pulmonary difficulty this am. He doesn't look like he is breathing as easily as yesterday. Will start some po cardizem for rate control.  Avoid BB and amio with lung disease.  Will check ECG and cardiac enzymes.   LOS: 2 days    Evelene Croon K 03/19/2012

## 2012-03-19 NOTE — Progress Notes (Signed)
Pt has home machine in room at this time. [facility] has been paged in order to check on machine.

## 2012-03-19 NOTE — Progress Notes (Signed)
Pt aware that a foley cath would need to be replaced, if he was unable to void by midnight.  He wasn't able, so while I was educating him on the procedure he told me " There is no GD way you are putting that thing back up my dick."  I instructed him on the why it should be replaced and then asked him if he was sure.  He said "I'm sure".  Bladder scanner volume was 287 ml.  I will continue to monitor.

## 2012-03-19 NOTE — Progress Notes (Signed)
Patient ID: [name], male   DOB: Mar 04, 1951, 61 y.o.   MRN: [id number]   Filed Vitals:   03/19/12 1800 03/19/12 1900 03/19/12 1941 03/19/12 2000  BP: 164/77 144/71  125/105  Pulse: 112 117  110  Temp:    98.2 F (36.8 C)  TempSrc:    Oral  Resp:  17  17  Height:      Weight:      SpO2: 91% 89% 90% 89%   Sleeping with his bipap Diuresed some today Ambulated. Treating for possible pneumonia.

## 2012-03-19 NOTE — Progress Notes (Signed)
ANTIBIOTIC CONSULT NOTE - INITIAL  Pharmacy Consult for vancomycin,zosyn Indication: rule out pneumonia  No Known Allergies  Patient Measurements: Weight: 304 lb 14.3 oz (138.3 kg) Adjusted Body Weight:   Vital Signs: Temp: 98.6 F (37 C) (05/15 0742) Temp src: Oral (05/15 0742) BP: 139/71 mmHg (05/15 0900) Pulse Rate: 117  (05/15 0900) Intake/Output from previous day: 05/14 0701 - 05/15 0700 In: 2065 [P.O.:660; I.V.:1255; IV Piggyback:150] Out: 985 [Urine:595; Chest Tube:390] Intake/Output from this shift:    Labs:  Basename 03/19/12 0400 03/18/12 0429 03/17/12 0658  WBC 20.0* 14.9* --  HGB 11.4* 11.6* 11.2*  PLT 254 234 --  LABCREA -- -- --  CREATININE 0.96 0.96 --   The CrCl is unknown because both a height and weight (above a minimum accepted value) are required for this calculation. No results found for this basename: VANCOTROUGH:2,VANCOPEAK:2,VANCORANDOM:2,GENTTROUGH:2,GENTPEAK:2,GENTRANDOM:2,TOBRATROUGH:2,TOBRAPEAK:2,TOBRARND:2,AMIKACINPEAK:2,AMIKACINTROU:2,AMIKACIN:2, in the last 72 hours   Microbiology: Recent Results (from the past 720 hour(s))  SURGICAL PCR SCREEN     Status: Normal   Collection Time   03/13/12 11:35 AM      Component Value Range Status Comment   MRSA, PCR NEGATIVE  NEGATIVE  Final    Staphylococcus aureus NEGATIVE  NEGATIVE  Final     Medical History: Past Medical History  Diagnosis Date  . Anemia   . Allergy   . Thyroid disease   . Parathyroid disease   . Headache   . Hypertension     takes  HYzaar daily  . Coronary artery disease     has 1 stent  . Myocardial infarct   . Asthma   . Emphysema     sees Dr.Ramaswami for this  . COPD (chronic obstructive pulmonary disease)     uses Albuterol and Spiriva daily  . Shortness of breath     with exertion   . Pneumonia     hx of last time in 2012  . Bronchitis   . Productive cough     white in color but no odor  . Sleep apnea     uses BiPaP  . Peripheral neuropathy   . Lung  mass     right upper lobe  . Back pain     4 deteriorating disc and receives an injection q3-84mon;has been doing this for about 62yrs  . H/O hiatal hernia   . GERD (gastroesophageal reflux disease)     takes Prilosec daily  . Blood transfusion     as a child  . Diabetes mellitus     takes Metformin bid and Novolog and Lantus daily  . Glaucoma     hx of  . Depression     takes Prozac daily  . Insomnia     takes Trazodone nightly    Medications:  Prescriptions prior to admission  Medication Sig Dispense Refill  . acetaminophen (TYLENOL) 500 MG tablet Take 1,500-2,000 mg by mouth 3 (three) times daily as needed. For pain      . albuterol (PROVENTIL) (2.5 MG/3ML) 0.083% nebulizer solution Take 2.5 mg by nebulization every 6 (six) hours as needed. For shortness of breath      . aspirin EC 81 MG tablet Take 81 mg by mouth daily.      Marland Kitchen FLUoxetine (PROZAC) 40 MG capsule Take 40 mg by mouth daily.        Marland Kitchen gabapentin (NEURONTIN) 300 MG capsule Take 300 mg by mouth 3 (three) times daily.       . insulin aspart (NOVOLOG) 100  UNIT/ML injection Inject 30 Units into the skin 3 (three) times daily before meals.       . insulin glargine (LANTUS) 100 UNIT/ML injection Inject 100 Units into the skin every morning.       Marland Kitchen losartan-hydrochlorothiazide (HYZAAR) 100-12.5 MG per tablet Take 1 tablet by mouth Daily.      . metFORMIN (GLUCOPHAGE) 500 MG tablet Take 500 mg by mouth 2 (two) times daily with a meal.      . naphazoline-pheniramine (NAPHCON-A) 0.025-0.3 % ophthalmic solution Place 1 drop into both eyes daily as needed. For dry eyes      . omeprazole (PRILOSEC) 20 MG capsule Take 20 mg by mouth daily.        . potassium chloride (K-DUR,KLOR-CON) 10 MEQ tablet Take 4 tablets (40 mEq total) by mouth once. ONE DOSE ONLY  4 tablet  0  . tiotropium (SPIRIVA) 18 MCG inhalation capsule Place 18 mcg into inhaler and inhale daily.        . traZODone (DESYREL) 100 MG tablet Take 100 mg by mouth at  bedtime.         Assessment: 61 yo man s/p thoracotomy/lobectomy to start broad spectrum antibiotics for possible PNA.  His UOP has been decreased so will monitor.  SrCr stable.  Goal of Therapy:  Vancomycin trough level 15-20 mcg/ml  Plan:  Zosyn 3.375 gm IV q8 hours,each dose infused over 4 hours. Vancomycin 2 gm load then 1 gm IV q12 hours. F/u cultures, renal function and clinical course.  Talbert Cage Poteet 03/19/2012,9:21 AM

## 2012-03-19 NOTE — Progress Notes (Signed)
Potassium this a.m. 3.7, KCL protocol initiated.  Also pt. voided at 0400.  I will continue to monitor.

## 2012-03-19 NOTE — Consult Note (Signed)
Name: [name] MRN: [id number] DOB: 1951/05/25    LOS: 2  [facility] / Critical Care Note   History of Present Illness: 61 y/o M,obese, smoker (2ppd) with extensive medical history admitted on 5/12 with 6 mo hx of exertional dyspnea (followed by Dr. Marchelle Gearing).  Was having R sided chest wall pain and CT scan demonstrated in February 1.4 x 1.3 cm irregular cavitary nodule in the posterior segment of the right upper lobe. PET scan performed which showed mild hypermetabolic activity within this nodule with a maximum SUV of 2.6. R.  On 02/28/2012 repeat CT which showed an increase in the size of this cavitary nodule to 1.6 x 1.8 cm. 5/13 underwent Right thoracotomy, wedge resection of right upper lobe lung nodules with frozen section, right upper lobectomy and mediastinal lymph node dissection.    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   Antibiotics: 5/14 Vanco (bil infiltrates)>>> 5/14 Zosyn (bil infiltrates)>>>  Tests / Events:    Past Medical History  Diagnosis Date  . Anemia   . Allergy   . Thyroid disease   . Parathyroid disease   . Headache   . Hypertension     takes  HYzaar daily  . Coronary artery disease     has 1 stent  . Myocardial infarct   . Asthma   . Emphysema     sees Dr.Ramaswami for this  . COPD (chronic obstructive pulmonary disease)     uses Albuterol and Spiriva daily  . Shortness of breath     with exertion   . Pneumonia     hx of last time in 2012  . Bronchitis   . Productive cough     white in color but no odor  . Sleep apnea     uses BiPaP  . Peripheral neuropathy   . Lung mass     right upper lobe  . Back pain     4 deteriorating disc and receives an injection q3-44mon;has been doing this for about 87yrs  .  H/O hiatal hernia   . GERD (gastroesophageal reflux disease)     takes Prilosec daily  . Blood transfusion     as a child  . Diabetes mellitus     takes Metformin bid and Novolog and Lantus daily  . Glaucoma     hx of  . Depression     takes Prozac daily  . Insomnia     takes Trazodone nightly    Past Surgical History  Procedure Date  . Carpal tunnel release     bilateral  . Rotator cuff repair   . Hernia repair 15+yrs ago    double inguinal  . Ulnar nerve surgery   . Cardiac catheterization 2006/2008/2009  . Coronary angioplasty     1 stent  . Colonoscopy   . Esophagogastroduodenoscopy   . Eye surgery     Prior to Admission medications   Medication Sig Start Date End Date Taking? Authorizing Provider  acetaminophen (TYLENOL) 500 MG tablet Take 1,500-2,000 mg by mouth 3 (three) times daily as needed. For pain   Yes Historical Provider, MD  albuterol (PROVENTIL) (2.5 MG/3ML) 0.083% nebulizer solution Take 2.5 mg by nebulization every 6 (six) hours as needed. For shortness of breath   Yes Historical Provider, MD  aspirin EC 81 MG tablet  Take 81 mg by mouth daily.   Yes Historical Provider, MD  FLUoxetine (PROZAC) 40 MG capsule Take 40 mg by mouth daily.     Yes Historical Provider, MD  gabapentin (NEURONTIN) 300 MG capsule Take 300 mg by mouth 3 (three) times daily.  10/06/11  Yes Historical Provider, MD  insulin aspart (NOVOLOG) 100 UNIT/ML injection Inject 30 Units into the skin 3 (three) times daily before meals.    Yes Historical Provider, MD  insulin glargine (LANTUS) 100 UNIT/ML injection Inject 100 Units into the skin every morning.  09/22/11  Yes Srikar Cherlynn Kaiser, MD  losartan-hydrochlorothiazide (HYZAAR) 100-12.5 MG per tablet Take 1 tablet by mouth Daily. 10/18/11  Yes Historical Provider, MD  metFORMIN (GLUCOPHAGE) 500 MG tablet Take 500 mg by mouth 2 (two) times daily with a meal.   Yes Historical Provider, MD  naphazoline-pheniramine (NAPHCON-A) 0.025-0.3 %  ophthalmic solution Place 1 drop into both eyes daily as needed. For dry eyes   Yes Historical Provider, MD  omeprazole (PRILOSEC) 20 MG capsule Take 20 mg by mouth daily.     Yes Historical Provider, MD  potassium chloride (K-DUR,KLOR-CON) 10 MEQ tablet Take 4 tablets (40 mEq total) by mouth once. ONE DOSE ONLY 03/13/12 03/13/13 Yes Alleen Borne, MD  tiotropium (SPIRIVA) 18 MCG inhalation capsule Place 18 mcg into inhaler and inhale daily.     Yes Historical Provider, MD  traZODone (DESYREL) 100 MG tablet Take 100 mg by mouth at bedtime.     Yes Historical Provider, MD    Allergies No Known Allergies  Family History Family History  Problem Relation Age of Onset  . COPD Mother   . Heart attack Father   . Anesthesia problems Neg Hx   . Hypotension Neg Hx   . Malignant hyperthermia Neg Hx   . Pseudochol deficiency Neg Hx     Social History  reports that he has been smoking Cigarettes.  He has a 45 pack-year smoking history. He has never used smokeless tobacco. He reports that he uses illicit drugs (Cocaine). He reports that he does not drink alcohol.  Review Of Systems:  Gen: Denies fever, chills, weight change, fatigue, night sweats HEENT: Denies blurred vision, double vision, hearing loss, tinnitus, sinus congestion, rhinorrhea, sore throat, neck stiffness, dysphagia PULM: Denies cough, sputum production, hemoptysis.  More short of breath than yesterday, tight, wheezing.  CV: Denies chest pain, edema, orthopnea, paroxysmal nocturnal dyspnea, palpitations GI: Denies abdominal pain, nausea, vomiting, diarrhea, hematochezia, melena, constipation, change in bowel habits GU: Denies dysuria, hematuria, polyuria, oliguria, urethral discharge Endocrine: Denies hot or cold intolerance, polyuria, polyphagia or appetite change Derm: Denies rash, dry skin, scaling or peeling skin change Heme: Denies easy bruising, bleeding, bleeding gums Neuro: Denies headache, numbness, weakness, slurred speech,  loss of memory or consciousness  Vital Signs: Temp:  [98.1 F (36.7 C)-101.1 F (38.4 C)] 98.6 F (37 C) (05/15 0742) Pulse Rate:  [93-133] 108  (05/15 1100) Resp:  [13-25] 19  (05/15 1100) BP: (105-139)/(58-76) 134/63 mmHg (05/15 1100) SpO2:  [89 %-96 %] 89 % (05/15 1100) Weight:  [304 lb 14.3 oz (138.3 kg)] 304 lb 14.3 oz (138.3 kg) (05/15 1122) I/O last 3 completed shifts: In: 4617.7 [P.O.:1290; I.V.:2723.7; IV Piggyback:604] Out: 1750 [Urine:1050; Chest Tube:700]  Physical Examination: General: chronically ill in NAD, pleasant Neuro: AAOx4, speech clear, MAD CV: s1s2 rrr, no m/r/g PULM: resp's even/non-labored, inspiratory wheeze bilaterally, worse upper airway GI: obese/soft, bsx4 active Extremities: warm/dry, no edema  Labs    CBC  Lab 03/19/12 0400 03/18/12 0429 03/17/12 0658 03/13/12 1136  HGB 11.4* 11.6* 11.2* --  HCT 34.1* 33.5* 33.0* --  WBC 20.0* 14.9* -- 12.6*  PLT 254 234 -- 276     BMET  Lab 03/19/12 0400 03/18/12 0429 03/17/12 0658 03/13/12 1136  NA 136 138 142 141  K 3.7 3.2* -- --  CL 97 98 -- 98  CO2 26 27 -- 25  GLUCOSE 136* 110* 178* 49*  BUN 16 12 -- 11  CREATININE 0.96 0.96 -- 0.71  CALCIUM 6.6* 5.9* -- 7.3*  MG -- -- -- --  PHOS -- -- -- --     Lab 03/13/12 1136  INR 1.00     Lab 03/18/12 0421 03/13/12 1136  PHART 7.368 7.504*  PCO2ART 47.6* 37.5  PO2ART 63.0* 52.3*  HCO3 27.4* 29.3*  TCO2 29 30.4  O2SAT 91.0 90.3     Radiology: 5/13 CXR>>>rotated, small R apical PTX, ct's in good position, bilateral edema pattern   Assessment and Plan:  Acute on Chronic Respiratory Failure in setting of COPD, heavy tobacco abuse, OSA, s/p wedge resection with 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  CXR 5/14 concerning for PNA vs. Edema.  Plan: -empiric abx as above -diuresis per TCTS -Oncology Consult per Primary SVC -Auto-set CPAP QHS -f/u cxr in am -sputum culture     Canary Brim,  NP-C [facility] Pgr: [phone]  03/19/2012, 11:27 AM     Attending Addendum:  I have seen the patient, discussed the issues, test results and plans with B. Veleta Miners, NP. I agree with the Assessment and Plans as ammended above.   Levy Pupa, MD, PhD 03/19/2012, 11:47 AM  Pulmonary and Critical Care [phone] or if no answer [phone]

## 2012-03-20 ENCOUNTER — Inpatient Hospital Stay (HOSPITAL_COMMUNITY): Payer: Managed Care, Other (non HMO)

## 2012-03-20 DIAGNOSIS — J180 Bronchopneumonia, unspecified organism: Secondary | ICD-10-CM

## 2012-03-20 DIAGNOSIS — J449 Chronic obstructive pulmonary disease, unspecified: Secondary | ICD-10-CM

## 2012-03-20 LAB — GLUCOSE, CAPILLARY
Glucose-Capillary: 152 mg/dL — ABNORMAL HIGH (ref 70–99)
Glucose-Capillary: 156 mg/dL — ABNORMAL HIGH (ref 70–99)
Glucose-Capillary: 163 mg/dL — ABNORMAL HIGH (ref 70–99)
Glucose-Capillary: 225 mg/dL — ABNORMAL HIGH (ref 70–99)

## 2012-03-20 LAB — BASIC METABOLIC PANEL
BUN: 13 mg/dL (ref 6–23)
Chloride: 97 mEq/L (ref 96–112)
GFR calc Af Amer: >90 mL/min (ref >90)
GFR calc non Af Amer: >90 mL/min (ref >90)
Potassium: 3.8 mEq/L (ref 3.5–5.1)
Sodium: 136 mEq/L (ref 135–145)

## 2012-03-20 LAB — CBC
HCT: 31.3 % — ABNORMAL LOW (ref 39.0–52.0)
MCHC: 33.5 g/dL (ref 30.0–36.0)
Platelets: 237 10*3/uL (ref 150–400)
RDW: 13.6 % (ref 11.5–15.5)
WBC: 17 10*3/uL — ABNORMAL HIGH (ref 4.0–10.5)

## 2012-03-20 MED ORDER — POTASSIUM CHLORIDE CRYS ER 20 MEQ PO TBCR
40.0000 meq | EXTENDED_RELEASE_TABLET | Freq: Three times a day (TID) | ORAL | Status: AC
  Administered 2012-03-20 (×3): 40 meq via ORAL
  Filled 2012-03-20: qty 2

## 2012-03-20 MED ORDER — LORAZEPAM 2 MG/ML IJ SOLN: 1.0000 mg | INTRAMUSCULAR | Status: DC | PRN

## 2012-03-20 MED ORDER — THIAMINE HCL 100 MG/ML IJ SOLN
100.0000 mg | Freq: Every day | INTRAMUSCULAR | Status: DC
  Filled 2012-03-20 (×3): qty 1

## 2012-03-20 MED ORDER — LORAZEPAM 2 MG/ML IJ SOLN
INTRAMUSCULAR | Status: AC
  Administered 2012-03-20: 1 mg
  Filled 2012-03-20: qty 1

## 2012-03-20 MED ORDER — VITAMIN B-1 100 MG PO TABS
100.0000 mg | ORAL_TABLET | Freq: Every day | ORAL | Status: DC
  Administered 2012-03-20 – 2012-04-04 (×16): 100 mg via ORAL
  Filled 2012-03-20 (×16): qty 1

## 2012-03-20 MED ORDER — NICOTINE 21 MG/24HR TD PT24
21.0000 mg | MEDICATED_PATCH | Freq: Every day | TRANSDERMAL | Status: DC
  Administered 2012-03-20 – 2012-03-21 (×2): 21 mg via TRANSDERMAL
  Filled 2012-03-20 (×3): qty 1

## 2012-03-20 MED ORDER — FUROSEMIDE 10 MG/ML IJ SOLN
80.0000 mg | Freq: Four times a day (QID) | INTRAMUSCULAR | Status: AC
  Administered 2012-03-20 (×3): 80 mg via INTRAVENOUS
  Filled 2012-03-20 (×3): qty 8

## 2012-03-20 MED ORDER — ADULT MULTIVITAMIN W/MINERALS CH
1.0000 | ORAL_TABLET | Freq: Every day | ORAL | Status: DC
  Administered 2012-03-20 – 2012-04-04 (×16): 1 via ORAL
  Filled 2012-03-20 (×16): qty 1

## 2012-03-20 MED ORDER — LORAZEPAM 1 MG PO TABS
1.0000 mg | ORAL_TABLET | Freq: Four times a day (QID) | ORAL | Status: DC | PRN
  Administered 2012-03-20: 1 mg via ORAL
  Filled 2012-03-20: qty 1

## 2012-03-20 MED ORDER — LORAZEPAM 2 MG/ML IJ SOLN: 1.0000 mg | Freq: Four times a day (QID) | INTRAMUSCULAR | Status: AC | PRN

## 2012-03-20 MED ORDER — LORAZEPAM 1 MG PO TABS
1.0000 mg | ORAL_TABLET | Freq: Four times a day (QID) | ORAL | Status: AC | PRN
  Administered 2012-03-21 – 2012-03-23 (×4): 1 mg via ORAL
  Filled 2012-03-20 (×4): qty 1

## 2012-03-20 MED ORDER — FOLIC ACID 1 MG PO TABS
1.0000 mg | ORAL_TABLET | Freq: Every day | ORAL | Status: DC
  Administered 2012-03-20 – 2012-04-04 (×16): 1 mg via ORAL
  Filled 2012-03-20 (×16): qty 1

## 2012-03-20 NOTE — Consult Note (Signed)
Name: [name] MRN: [id number] DOB: September 30, 1951    LOS: 3  [facility] / Critical Care Note   History of Present Illness: 61 y/o M,obese, smoker (2ppd) with extensive medical history admitted on 5/12 with 6 mo hx of exertional dyspnea (followed by Dr. Marchelle Gearing).  Was having R sided chest wall pain and CT scan demonstrated in February 1.4 x 1.3 cm irregular cavitary nodule in the posterior segment of the right upper lobe. PET scan performed which showed mild hypermetabolic activity within this nodule with a maximum SUV of 2.6. R.  On 02/28/2012 repeat CT which showed an increase in the size of this cavitary nodule to 1.6 x 1.8 cm. 5/13 underwent Right thoracotomy, wedge resection of right upper lobe lung nodules with frozen section, right upper lobectomy and mediastinal lymph node dissection.  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  Antibiotics: 5/14 Vanco (bil infiltrates)>>> 5/14 Zosyn (bil infiltrates)>>>  Tests / Events:  Subjective:  Some confusion last night Comfortable today Tolerated diuresis, on empiric abx   Vital Signs: Temp:  [98.1 F (36.7 C)-99.3 F (37.4 C)] 98.1 F (36.7 C) (05/16 0803) Pulse Rate:  [99-126] 126  (05/16 1000) Resp:  [15-26] 19  (05/16 1000) BP: (94-164)/(45-105) 140/60 mmHg (05/16 1000) SpO2:  [87 %-97 %] 87 % (05/16 1000) Weight:  [137.395 kg (302 lb 14.4 oz)-138.3 kg (304 lb 14.3 oz)] 137.395 kg (302 lb 14.4 oz) (05/16 0600) I/O last 3 completed shifts: In: 3180.7 [P.O.:1230; I.V.:942.7; IV Piggyback:1008] Out: 3505 [Urine:3125; Chest Tube:380]  Physical Examination: General: chronically ill in NAD, pleasant Neuro: AAOx4, speech clear, MAD CV: s1s2 rrr, no m/r/g PULM: resp's even/non-labored, inspiratory wheeze  bilaterally, worse upper airway GI: obese/soft, bsx4 active Extremities: warm/dry, no edema  Labs    CBC  Lab 03/20/12 0400 03/19/12 0400 03/18/12 0429  HGB 10.5* 11.4* 11.6*  HCT 31.3* 34.1* 33.5*  WBC 17.0* 20.0* 14.9*  PLT 237 254 234    BMET  Lab 03/20/12 0400 03/19/12 0400 03/18/12 0429 03/17/12 0658 03/13/12 1136  NA 136 136 138 142 141  K 3.8 3.7 -- -- --  CL 97 97 98 -- 98  CO2 26 26 27  -- 25  GLUCOSE 166* 136* 110* 178* 49*  BUN 13 16 12  -- 11  CREATININE 0.83 0.96 0.96 -- 0.71  CALCIUM 6.9* 6.6* 5.9* -- 7.3*  MG -- -- -- -- --  PHOS -- -- -- -- --    Lab 03/13/12 1136  INR 1.00     Lab 03/18/12 0421 03/13/12 1136  PHART 7.368 7.504*  PCO2ART 47.6* 37.5  PO2ART 63.0* 52.3*  HCO3 27.4* 29.3*  TCO2 29 30.4  O2SAT 91.0 90.3     Radiology: 5/16 CXR>>>rotated, ct's in good position, bilateral infiltrates L > R   Assessment and Plan:  Acute on Chronic Respiratory Failure in setting of COPD, heavy tobacco abuse, OSA, s/p wedge resection with 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  CXR 5/14 concerning for PNA vs. Edema.  Plan: -empiric abx as above, WBC improved 5/16 -would continue diuresis and follow CXR - pulmonary toilet and mobilize -Oncology Consult per Primary SVC -Auto-set CPAP QHS -sputum culture   Levy Pupa, MD, PhD 03/20/2012, 10:41 AM [facility] [phone] or if no answer [phone]

## 2012-03-20 NOTE — Progress Notes (Signed)
Status post lobectomy right upper lobe Postop delirium probable alcohol withdrawal Currently comfortable on Ativan protocol Resting on BiPAP with adequate O2 saturation Continue current care

## 2012-03-20 NOTE — Progress Notes (Signed)
3 Days Post-Op Procedure(s) (LRB): THORACOTOMY/LOBECTOMY (Right) Subjective: No complaints.  Slept. Reportedly had some agitation overnight and wanted to leave.  Objective: Vital signs in last 24 hours: Temp:  [98.2 F (36.8 C)-99.3 F (37.4 C)] 99.3 F (37.4 C) (05/16 0400) Pulse Rate:  [99-121] 106  (05/16 0725) Cardiac Rhythm:  [-] Sinus tachycardia (05/16 0400) Resp:  [15-26] 19  (05/16 0725) BP: (102-164)/(50-105) 161/67 mmHg (05/16 0725) SpO2:  [88 %-97 %] 94 % (05/16 0725) Weight:  [137.395 kg (302 lb 14.4 oz)-138.3 kg (304 lb 14.3 oz)] 137.395 kg (302 lb 14.4 oz) (05/16 0600)  Hemodynamic parameters for last 24 hours:    Intake/Output from previous day: 05/15 0701 - 05/16 0700 In: 2190.1 [P.O.:870; I.V.:362.1; IV Piggyback:958] Out: 2755 [Urine:2575; Chest Tube:180] Intake/Output this shift:    General appearance: alert and cooperative Neurologic: intact Heart: regular rate and rhythm, S1, S2 normal, no murmur, click, rub or gallop Lungs: rhonchi bilaterally Wound: dressing dry. no air leak from chest tube  Lab Results:  Basename 03/20/12 0400 03/19/12 0400  WBC 17.0* 20.0*  HGB 10.5* 11.4*  HCT 31.3* 34.1*  PLT 237 254   BMET:  Basename 03/20/12 0400 03/19/12 0400  NA 136 136  K 3.8 3.7  CL 97 97  CO2 26 26  GLUCOSE 166* 136*  BUN 13 16  CREATININE 0.83 0.96  CALCIUM 6.9* 6.6*    PT/INR: No results found for this basename: LABPROT,INR in the last 72 hours ABG    Component Value Date/Time   PHART 7.368 03/18/2012 0421   HCO3 27.4* 03/18/2012 0421   TCO2 29 03/18/2012 0421   ACIDBASEDEF 2.0 01/05/2009 1120   O2SAT 91.0 03/18/2012 0421   CBG (last 3)   Basename 03/20/12 0402 03/19/12 2348 03/19/12 2030  GLUCAP 167* 152* 154*   CXR:  Increased bilateral air space disease or edema  Assessment/Plan: S/P Procedure(s) (LRB): THORACOTOMY/LOBECTOMY (Right) Continue diuresis Possible postop pneumonia: wbc down slightly and no fever yesterday.   Continue antibiotics and pulmonary toilet. Pathology discussed with patient. CT to water seal   LOS: 3 days    Evelene Croon K 03/20/2012

## 2012-03-21 ENCOUNTER — Inpatient Hospital Stay (HOSPITAL_COMMUNITY): Payer: Managed Care, Other (non HMO)

## 2012-03-21 DIAGNOSIS — E119 Type 2 diabetes mellitus without complications: Secondary | ICD-10-CM

## 2012-03-21 DIAGNOSIS — I10 Essential (primary) hypertension: Secondary | ICD-10-CM

## 2012-03-21 LAB — BASIC METABOLIC PANEL
BUN: 17 mg/dL (ref 6–23)
CO2: 28 mEq/L (ref 19–32)
Calcium: 6.8 mg/dL — ABNORMAL LOW (ref 8.4–10.5)
GFR calc Af Amer: 75 mL/min — ABNORMAL LOW (ref >90)
GFR calc non Af Amer: 65 mL/min — ABNORMAL LOW (ref >90)
Glucose, Bld: 207 mg/dL — ABNORMAL HIGH (ref 70–99)
Potassium: 3.7 mEq/L (ref 3.5–5.1)
Sodium: 134 mEq/L — ABNORMAL LOW (ref 135–145)

## 2012-03-21 LAB — VANCOMYCIN, TROUGH: Vancomycin Tr: 11.9 ug/mL (ref 10.0–20.0)

## 2012-03-21 LAB — CBC
HCT: 28.4 % — ABNORMAL LOW (ref 39.0–52.0)
Hemoglobin: 9.6 g/dL — ABNORMAL LOW (ref 13.0–17.0)
MCH: 29.4 pg (ref 26.0–34.0)
RBC: 3.27 MIL/uL — ABNORMAL LOW (ref 4.22–5.81)

## 2012-03-21 LAB — GLUCOSE, CAPILLARY: Glucose-Capillary: 226 mg/dL — ABNORMAL HIGH (ref 70–99)

## 2012-03-21 MED ORDER — INSULIN ASPART 100 UNIT/ML ~~LOC~~ SOLN
15.0000 [IU] | Freq: Three times a day (TID) | SUBCUTANEOUS | Status: DC
  Administered 2012-03-21 – 2012-03-27 (×15): 15 [IU] via SUBCUTANEOUS

## 2012-03-21 MED ORDER — SODIUM CHLORIDE 0.9 % IV SOLN
1500.0000 mg | Freq: Two times a day (BID) | INTRAVENOUS | Status: DC
  Administered 2012-03-21 – 2012-03-22 (×3): 1500 mg via INTRAVENOUS
  Filled 2012-03-21 (×3): qty 1500

## 2012-03-21 MED ORDER — POTASSIUM CHLORIDE CRYS ER 20 MEQ PO TBCR
40.0000 meq | EXTENDED_RELEASE_TABLET | Freq: Once | ORAL | Status: AC
  Administered 2012-03-21: 40 meq via ORAL
  Filled 2012-03-21: qty 2

## 2012-03-21 MED ORDER — ALBUTEROL SULFATE HFA 108 (90 BASE) MCG/ACT IN AERS
2.0000 | INHALATION_SPRAY | RESPIRATORY_TRACT | Status: DC | PRN
  Filled 2012-03-21: qty 6.7

## 2012-03-21 MED ORDER — METFORMIN HCL 500 MG PO TABS
500.0000 mg | ORAL_TABLET | Freq: Two times a day (BID) | ORAL | Status: DC
  Administered 2012-03-21 (×2): 500 mg via ORAL
  Filled 2012-03-21 (×6): qty 1

## 2012-03-21 MED ORDER — POTASSIUM CHLORIDE IN NACL 20-0.9 MEQ/L-% IV SOLN
INTRAVENOUS | Status: DC
  Administered 2012-03-21: 07:00:00 via INTRAVENOUS
  Filled 2012-03-21: qty 1000

## 2012-03-21 MED ORDER — INSULIN GLARGINE 100 UNIT/ML ~~LOC~~ SOLN
80.0000 [IU] | Freq: Two times a day (BID) | SUBCUTANEOUS | Status: DC
  Administered 2012-03-21 – 2012-03-22 (×4): 80 [IU] via SUBCUTANEOUS

## 2012-03-21 NOTE — Progress Notes (Signed)
4 Days Post-Op Procedure(s) (LRB): THORACOTOMY/LOBECTOMY (Right) Subjective: No complaints  Objective: Vital signs in last 24 hours: Temp:  [97.8 F (36.6 C)-100.2 F (37.9 C)] 98.5 F (36.9 C) (05/17 0400) Pulse Rate:  [50-164] 99  (05/17 0700) Cardiac Rhythm:  [-] Sinus tachycardia (05/17 0000) Resp:  [15-40] 19  (05/17 0700) BP: (94-157)/(45-94) 130/68 mmHg (05/17 0700) SpO2:  [86 %-98 %] 97 % (05/17 0700) Weight:  [133.6 kg (294 lb 8.6 oz)] 133.6 kg (294 lb 8.6 oz) (05/17 0600)  Hemodynamic parameters for last 24 hours:    Intake/Output from previous day: 05/16 0701 - 05/17 0700 In: 1304 [P.O.:420; I.V.:448; IV Piggyback:436] Out: 5050 [Urine:4900; Chest Tube:150] Intake/Output this shift:    General appearance: alert and cooperative Neurologic: intact Heart: regular rate and rhythm, S1, S2 normal, no murmur, click, rub or gallop Lungs: slight wheeze bilaterally Extremities: extremities normal, atraumatic, no cyanosis or edema Wound: incision ok  Lab Results:  Basename 03/21/12 0415 03/20/12 0400  WBC 14.5* 17.0*  HGB 9.6* 10.5*  HCT 28.4* 31.3*  PLT 261 237   BMET:  Basename 03/21/12 0415 03/20/12 0400  NA 134* 136  K 3.7 3.8  CL 93* 97  CO2 28 26  GLUCOSE 207* 166*  BUN 17 13  CREATININE 1.17 0.83  CALCIUM 6.8* 6.9*    PT/INR: No results found for this basename: LABPROT,INR in the last 72 hours ABG    Component Value Date/Time   PHART 7.368 03/18/2012 0421   HCO3 27.4* 03/18/2012 0421   TCO2 29 03/18/2012 0421   ACIDBASEDEF 2.0 01/05/2009 1120   O2SAT 91.0 03/18/2012 0421   CBG (last 3)   Basename 03/21/12 0357 03/21/12 0002 03/20/12 2025  GLUCAP 199* 226* 225*    Assessment/Plan: S/P Procedure(s) (LRB): THORACOTOMY/LOBECTOMY (Right) Mobilize Diabetes control:  Resume glucophage, increase Lantus and Novolog Continue ABX therapy due to Post-op infection, possible pneumonia DC chest tube Delerium probably due to ETOH withdrawal.  Better  today.  Continue Ativan protocol  LOS: 4 days    Rayme Bui K 03/21/2012

## 2012-03-21 NOTE — Progress Notes (Signed)
ANTIBIOTIC CONSULT NOTE - INITIAL  Pharmacy Consult for vancomycin,zosyn Indication: rule out pneumonia   Assessment: 61 yo man s/p thoracotomy/lobectomy to on D#3 Vancomycin/zosyn for possible PNA. His scr increased slightly 1.17 << 0.83 but UOP is good (1.5 ml/kg/hr). Vancomycin trough after 4th dose is subtherapeutic (11.9). Pt. Is afebrile, wbc still mildly elevated, and trending down. Plan to continue vanc/zosyn per critical care note  Goal of Therapy:  Vancomycin trough level 15-20 mcg/ml  Plan:  Continue Zosyn 3.375 gm IV q8 hours,each dose infused over 4 hours. Increase vancomycin dose to 1500mg  IV Q 12hrs Closely monitor scr and UOP, consider recheck vancomycin trough after 3 more doses.  Bayard Hugger, PharmD, BCPS  Clinical Pharmacist  Pager: [phone]  03/21/2012,11:32 AM  No Known Allergies  Patient Measurements: Height: 5\' 11"  (180.3 cm) Weight: 294 lb 8.6 oz (133.6 kg) IBW/kg (Calculated) : 75.3  Adjusted Body Weight:   Vital Signs: Temp: 98.3 F (36.8 C) (05/17 0757) Temp src: Oral (05/17 0757) BP: 135/69 mmHg (05/17 1100) Pulse Rate: 100  (05/17 1100) Intake/Output from previous day: 05/16 0701 - 05/17 0700 In: 1304 [P.O.:420; I.V.:448; IV Piggyback:436] Out: 5050 [Urine:4900; Chest Tube:150] Intake/Output from this shift:    Labs:  Basename 03/21/12 0415 03/20/12 0400 03/19/12 0400  WBC 14.5* 17.0* 20.0*  HGB 9.6* 10.5* 11.4*  PLT 261 237 254  LABCREA -- -- --  CREATININE 1.17 0.83 0.96   Estimated Creatinine Clearance: 91.3 ml/min (by C-G formula based on Cr of 1.17).  Basename 03/21/12 0930  VANCOTROUGH 11.9  VANCOPEAK --  VANCORANDOM --  GENTTROUGH --  GENTPEAK --  GENTRANDOM --  TOBRATROUGH --  TOBRAPEAK --  TOBRARND --  AMIKACINPEAK --  AMIKACINTROU --  AMIKACIN --     Microbiology: Recent Results (from the past 720 hour(s))  SURGICAL PCR SCREEN     Status: Normal   Collection Time   03/13/12 11:35 AM      Component Value Range  Status Comment   MRSA, PCR NEGATIVE  NEGATIVE  Final    Staphylococcus aureus NEGATIVE  NEGATIVE  Final     Medical History: Past Medical History  Diagnosis Date  . Anemia   . Allergy   . Thyroid disease   . Parathyroid disease   . Headache   . Hypertension     takes  HYzaar daily  . Coronary artery disease     has 1 stent  . Myocardial infarct   . Asthma   . Emphysema     sees Dr.Ramaswami for this  . COPD (chronic obstructive pulmonary disease)     uses Albuterol and Spiriva daily  . Shortness of breath     with exertion   . Pneumonia     hx of last time in 2012  . Bronchitis   . Productive cough     white in color but no odor  . Sleep apnea     uses BiPaP  . Peripheral neuropathy   . Lung mass     right upper lobe  . Back pain     4 deteriorating disc and receives an injection q3-14mon;has been doing this for about 55yrs  . H/O hiatal hernia   . GERD (gastroesophageal reflux disease)     takes Prilosec daily  . Blood transfusion     as a child  . Diabetes mellitus     takes Metformin bid and Novolog and Lantus daily  . Glaucoma     hx of  .  Depression     takes Prozac daily  . Insomnia     takes Trazodone nightly    Medications:  Prescriptions prior to admission  Medication Sig Dispense Refill  . acetaminophen (TYLENOL) 500 MG tablet Take 1,500-2,000 mg by mouth 3 (three) times daily as needed. For pain      . albuterol (PROVENTIL) (2.5 MG/3ML) 0.083% nebulizer solution Take 2.5 mg by nebulization every 6 (six) hours as needed. For shortness of breath      . aspirin EC 81 MG tablet Take 81 mg by mouth daily.      Marland Kitchen FLUoxetine (PROZAC) 40 MG capsule Take 40 mg by mouth daily.        Marland Kitchen gabapentin (NEURONTIN) 300 MG capsule Take 300 mg by mouth 3 (three) times daily.       . insulin aspart (NOVOLOG) 100 UNIT/ML injection Inject 30 Units into the skin 3 (three) times daily before meals.       . insulin glargine (LANTUS) 100 UNIT/ML injection Inject 100 Units  into the skin every morning.       Marland Kitchen losartan-hydrochlorothiazide (HYZAAR) 100-12.5 MG per tablet Take 1 tablet by mouth Daily.      . metFORMIN (GLUCOPHAGE) 500 MG tablet Take 500 mg by mouth 2 (two) times daily with a meal.      . naphazoline-pheniramine (NAPHCON-A) 0.025-0.3 % ophthalmic solution Place 1 drop into both eyes daily as needed. For dry eyes      . omeprazole (PRILOSEC) 20 MG capsule Take 20 mg by mouth daily.        . potassium chloride (K-DUR,KLOR-CON) 10 MEQ tablet Take 4 tablets (40 mEq total) by mouth once. ONE DOSE ONLY  4 tablet  0  . tiotropium (SPIRIVA) 18 MCG inhalation capsule Place 18 mcg into inhaler and inhale daily.        . traZODone (DESYREL) 100 MG tablet Take 100 mg by mouth at bedtime.

## 2012-03-21 NOTE — Progress Notes (Signed)
Name: [name] MRN: [id number] DOB: [birth year]    LOS: 4  [facility] Critical [facility] Note   History of Present Illness: 61 y/o M,obese, smoker (2ppd) with extensive medical history admitted on 5/12 with 6 mo hx of exertional dyspnea (followed by Dr. Marchelle Gearing).  Was having R sided chest wall pain and CT scan demonstrated in February 1.4 x 1.3 cm irregular cavitary nodule in the posterior segment of the right upper lobe. PET scan performed which showed mild hypermetabolic activity within this nodule with a maximum SUV of 2.6. R.  On 02/28/2012 repeat CT which showed an increase in the size of this cavitary nodule to 1.6 x 1.8 cm. 5/13 underwent Right thoracotomy, wedge resection of right upper lobe lung nodules with frozen section, right upper lobectomy and mediastinal lymph node dissection.  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  Antibiotics: 5/14 Vanco (bil infiltrates)>>> 5/14 Zosyn (bil infiltrates)>>>  Tests / Events:  Subjective:  MS more clear Episode hypoglycemia 5/16, none since  Vital Signs: Temp:  [97.8 F (36.6 C)-100.2 F (37.9 C)] 98.3 F (36.8 C) (05/17 0757) Pulse Rate:  [50-164] 99  (05/17 0700) Resp:  [15-40] 19  (05/17 0700) BP: (105-157)/(56-94) 130/68 mmHg (05/17 0700) SpO2:  [86 %-98 %] 96 % (05/17 0828) Weight:  [133.6 kg (294 lb 8.6 oz)] 133.6 kg (294 lb 8.6 oz) (05/17 0600) I/O last 3 completed shifts: In: 1981.9 [P.O.:640; I.V.:655.9; IV Piggyback:686] Out: 5755 [Urine:5525; Chest Tube:230]  Physical Examination: General: chronically ill in NAD, pleasant Neuro: AAOx4, speech clear, MAD CV: s1s2 rrr, no m/r/g PULM: resp's even/non-labored, some inspiratory wheeze bilaterally, exp wheeze on  R GI: obese/soft, bsx4 active Extremities: warm/dry, no edema  Labs    CBC  Lab 03/21/12 0415 03/20/12 0400 03/19/12 0400  HGB 9.6* 10.5* 11.4*  HCT 28.4* 31.3* 34.1*  WBC 14.5* 17.0* 20.0*  PLT 261 237 254    BMET  Lab 03/21/12 0415 03/20/12 0400 03/19/12 0400 03/18/12 0429 03/17/12 0658  NA 134* 136 136 138 142  K 3.7 3.8 -- -- --  CL 93* 97 97 98 --  CO2 28 26 26 27  --  GLUCOSE 207* 166* 136* 110* 178*  BUN 17 13 16 12  --  CREATININE 1.17 0.83 0.96 0.96 --  CALCIUM 6.8* 6.9* 6.6* 5.9* --  MG -- -- -- -- --  PHOS -- -- -- -- --   No results found for this basename: INR:5 in the last 168 hours   Lab 03/18/12 0421  PHART 7.368  PCO2ART 47.6*  PO2ART 63.0*  HCO3 27.4*  TCO2 29  O2SAT 91.0     Radiology: 5/17 CXR>>>continued B infiltrates, L > R, ? 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  CXR 5/14 - 5/17 with Edema +/- PNA Plan: -empiric abx as above -would continue diuresis and follow CXR -pulmonary toilet and mobilize -Spiriva qd, albuterol prn -Oncology Consult per Primary SVC -Auto-set CPAP QHS -sputum culture never sent  Post-op delirium, ? Also component EtOH withdrawal -Ativan ordered with CIWA assessment   Levy Pupa, MD, PhD 03/21/2012, 9:13 AM [facility] [phone] or if no answer [phone]

## 2012-03-21 NOTE — Progress Notes (Signed)
TCTS BRIEF SICU PROGRESS NOTE  4 Days Post-Op  S/P Procedure(s) (LRB): THORACOTOMY/LOBECTOMY (Right)   Stable day.  No agitation CBG's still high  Plan: Increase lantus insulin and continue sliding scale  [name] H 03/21/2012 8:01 PM

## 2012-03-22 ENCOUNTER — Inpatient Hospital Stay (HOSPITAL_COMMUNITY): Payer: Managed Care, Other (non HMO)

## 2012-03-22 DIAGNOSIS — F17200 Nicotine dependence, unspecified, uncomplicated: Secondary | ICD-10-CM

## 2012-03-22 DIAGNOSIS — N179 Acute kidney failure, unspecified: Secondary | ICD-10-CM | POA: Diagnosis not present

## 2012-03-22 DIAGNOSIS — J930 Spontaneous tension pneumothorax: Secondary | ICD-10-CM

## 2012-03-22 LAB — CBC
HCT: 29 % — ABNORMAL LOW (ref 39.0–52.0)
MCH: 29.8 pg (ref 26.0–34.0)
MCV: 88.1 fL (ref 78.0–100.0)
RBC: 3.29 MIL/uL — ABNORMAL LOW (ref 4.22–5.81)
RDW: 14 % (ref 11.5–15.5)
WBC: 12.7 10*3/uL — ABNORMAL HIGH (ref 4.0–10.5)

## 2012-03-22 LAB — BASIC METABOLIC PANEL
BUN: 34 mg/dL — ABNORMAL HIGH (ref 6–23)
CO2: 24 mEq/L (ref 19–32)
Calcium: 6.8 mg/dL — ABNORMAL LOW (ref 8.4–10.5)
Chloride: 96 mEq/L (ref 96–112)
Creatinine, Ser: 3.16 mg/dL — ABNORMAL HIGH (ref 0.50–1.35)

## 2012-03-22 LAB — GLUCOSE, CAPILLARY
Glucose-Capillary: 116 mg/dL — ABNORMAL HIGH (ref 70–99)
Glucose-Capillary: 171 mg/dL — ABNORMAL HIGH (ref 70–99)
Glucose-Capillary: 124 mg/dL — ABNORMAL HIGH (ref 70–99)

## 2012-03-22 MED ORDER — LEVALBUTEROL HCL 0.63 MG/3ML IN NEBU
0.6300 mg | INHALATION_SOLUTION | RESPIRATORY_TRACT | Status: DC | PRN
  Administered 2012-03-23 (×2): 0.63 mg via RESPIRATORY_TRACT
  Filled 2012-03-22: qty 3

## 2012-03-22 MED ORDER — MIDAZOLAM HCL 2 MG/2ML IJ SOLN
INTRAMUSCULAR | Status: AC
  Filled 2012-03-22: qty 4

## 2012-03-22 MED ORDER — IPRATROPIUM BROMIDE 0.02 % IN SOLN
0.5000 mg | Freq: Four times a day (QID) | RESPIRATORY_TRACT | Status: DC
  Administered 2012-03-22 – 2012-03-26 (×17): 0.5 mg via RESPIRATORY_TRACT
  Filled 2012-03-22 (×16): qty 2.5

## 2012-03-22 MED ORDER — MIDAZOLAM HCL 2 MG/2ML IJ SOLN
INTRAMUSCULAR | Status: AC
  Administered 2012-03-22: 2 mg
  Filled 2012-03-22: qty 2

## 2012-03-22 MED ORDER — NICOTINE 14 MG/24HR TD PT24
14.0000 mg | MEDICATED_PATCH | Freq: Every day | TRANSDERMAL | Status: DC
  Administered 2012-03-22 – 2012-03-26 (×5): 14 mg via TRANSDERMAL
  Filled 2012-03-22 (×7): qty 1

## 2012-03-22 MED ORDER — IPRATROPIUM BROMIDE 0.02 % IN SOLN
RESPIRATORY_TRACT | Status: AC
  Filled 2012-03-22: qty 2.5

## 2012-03-22 MED ORDER — SODIUM CHLORIDE 0.9 % IV SOLN
INTRAVENOUS | Status: DC
  Administered 2012-03-22 – 2012-03-23 (×3): via INTRAVENOUS

## 2012-03-22 MED ORDER — MIDAZOLAM HCL 2 MG/2ML IJ SOLN: 2.0000 mg | Freq: Once | INTRAMUSCULAR | Status: AC

## 2012-03-22 MED ORDER — IPRATROPIUM BROMIDE 0.02 % IN SOLN
0.5000 mg | RESPIRATORY_TRACT | Status: DC | PRN
  Administered 2012-03-24: 0.5 mg via RESPIRATORY_TRACT
  Filled 2012-03-22: qty 2.5

## 2012-03-22 MED ORDER — BUDESONIDE 0.5 MG/2ML IN SUSP
0.5000 mg | Freq: Two times a day (BID) | RESPIRATORY_TRACT | Status: DC
  Administered 2012-03-22 – 2012-03-26 (×9): 0.5 mg via RESPIRATORY_TRACT
  Filled 2012-03-22 (×11): qty 2

## 2012-03-22 MED ORDER — VANCOMYCIN HCL 1000 MG IV SOLR
2000.0000 mg | INTRAVENOUS | Status: DC
  Administered 2012-03-24 – 2012-03-26 (×2): 2000 mg via INTRAVENOUS
  Filled 2012-03-22 (×3): qty 2000

## 2012-03-22 NOTE — Progress Notes (Signed)
ANTIBIOTIC CONSULT NOTE - FOLLOW UP  Pharmacy Consult for Vancomycin and Zosyn Indication: pneumonia  No Known Allergies  Patient Measurements: Height: 5\' 11"  (180.3 cm) Weight: 302 lb 14.6 oz (137.4 kg) IBW/kg (Calculated) : 75.3  Adjusted Body Weight: 100 kg  Vital Signs: Temp: 97.9 F (36.6 C) (05/18 1221) Temp src: Oral (05/18 1221) BP: 118/56 mmHg (05/18 1200) Pulse Rate: 97  (05/18 1200) Intake/Output from previous day: 05/17 0701 - 05/18 0700 In: 3020 [P.O.:1690; IV Piggyback:1330] Out: 575 [Urine:575] Intake/Output from this shift: Total I/O In: 890 [P.O.:290; IV Piggyback:600] Out: 455 [Urine:425; Chest Tube:30]  Labs:  Basename 03/22/12 0425 03/21/12 0415 03/20/12 0400  WBC 12.7* 14.5* 17.0*  HGB 9.8* 9.6* 10.5*  PLT 272 261 237  LABCREA -- -- --  CREATININE 3.16* 1.17 0.83   Estimated Creatinine Clearance: 34.3 ml/min (by C-G formula based on Cr of 3.16).  Basename 03/21/12 0930  VANCOTROUGH 11.9  VANCOPEAK --  VANCORANDOM --  GENTTROUGH --  GENTPEAK --  GENTRANDOM --  TOBRATROUGH --  TOBRAPEAK --  TOBRARND --  AMIKACINPEAK --  AMIKACINTROU --  AMIKACIN --     Microbiology: Recent Results (from the past 720 hour(s))  SURGICAL PCR SCREEN     Status: Normal   Collection Time   03/13/12 11:35 AM      Component Value Range Status Comment   MRSA, PCR NEGATIVE  NEGATIVE  Final    Staphylococcus aureus NEGATIVE  NEGATIVE  Final     Anti-infectives     Start     Dose/Rate Route Frequency Ordered Stop   03/21/12 1200   vancomycin (VANCOCIN) 1,500 mg in sodium chloride 0.9 % 500 mL IVPB        1,500 mg 250 mL/hr over 120 Minutes Intravenous Every 12 hours 03/21/12 1130     03/19/12 2200   vancomycin (VANCOCIN) IVPB 1000 mg/200 mL premix  Status:  Discontinued        1,000 mg 200 mL/hr over 60 Minutes Intravenous Every 12 hours 03/19/12 0927 03/21/12 1131   03/19/12 1000   vancomycin (VANCOCIN) 2,000 mg in sodium chloride 0.9 % 500 mL IVPB       2,000 mg 250 mL/hr over 120 Minutes Intravenous  Once 03/19/12 0927 03/19/12 1233   03/19/12 1000  piperacillin-tazobactam (ZOSYN) IVPB 3.375 g       3.375 g 12.5 mL/hr over 240 Minutes Intravenous Every 8 hours 03/19/12 0928     03/17/12 2000   cefUROXime (ZINACEF) 1.5 g in dextrose 5 % 50 mL IVPB        1.5 g 100 mL/hr over 30 Minutes Intravenous Every 12 hours 03/17/12 1518 03/18/12 0827   03/16/12 1506   cefUROXime (ZINACEF) 1.5 g in dextrose 5 % 50 mL IVPB        1.5 g 100 mL/hr over 30 Minutes Intravenous 60 min pre-op 03/16/12 1506 03/17/12 0829          Assessment: PNA:  On Day #4 of Vancomycin and Zosyn with significant worsening of renal function evidenced by a rise in SCr and drop in UOP.  His recently increased Vancomycin regimen is likely too high.  His clearance is acceptable to continue extended infusion Zosyn.  Goal of Therapy:  Vancomycin trough level 15-20 mcg/ml  Plan:  Decrease Vancomycin to 2000mg  IV q48h. Next dose 5/20. Continue Zosyn 3.375gm IV q8h extended infusion Monitor renal function and urine output closely as additional adjustments may be required.  Estella Husk, Pharm.D., BCPS  Clinical Pharmacist  Phone [phone] Pager [phone] 03/22/2012, 1:32 PM

## 2012-03-22 NOTE — Progress Notes (Signed)
   CARDIOTHORACIC SURGERY PROGRESS NOTE   R5 Days Post-Op Procedure(s) (LRB): THORACOTOMY/LOBECTOMY (Right)  Subjective: Some increased SOB this am.  No agitation.  Objective: Vital signs: BP Readings from Last 1 Encounters:  03/22/12 126/68   Pulse Readings from Last 1 Encounters:  03/22/12 110   Resp Readings from Last 1 Encounters:  03/22/12 20   Temp Readings from Last 1 Encounters:  03/22/12 98.6 F (37 C) Oral    Hemodynamics:    Physical Exam:  Rhythm:   sinus  Breath sounds: Diffuse wheezes, both lung fields.  Dramatic increase subQ emphysema  Heart sounds:  RRR  Incisions:  Copious serous drainage from right chest tube incisions  Abdomen:  soft  Extremities:  Swollen, adequately perfused   Intake/Output from previous day: 05/17 0701 - 05/18 0700 In: 3020 [P.O.:1690; IV Piggyback:1330] Out: 575 [Urine:575] Intake/Output this shift:    Lab Results:  Basename 03/22/12 0425 03/21/12 0415  WBC 12.7* 14.5*  HGB 9.8* 9.6*  HCT 29.0* 28.4*  PLT 272 261   BMET:  Basename 03/22/12 0425 03/21/12 0415  NA 136 134*  K 4.4 3.7  CL 96 93*  CO2 24 28  GLUCOSE 89 207*  BUN 34* 17  CREATININE 3.16* 1.17  CALCIUM 6.8* 6.8*    CBG (last 3)   Basename 03/22/12 0357 03/21/12 2328 03/21/12 2000  GLUCAP 96 167* 249*   ABG    Component Value Date/Time   PHART 7.368 03/18/2012 0421   HCO3 27.4* 03/18/2012 0421   TCO2 29 03/18/2012 0421   ACIDBASEDEF 2.0 01/05/2009 1120   O2SAT 91.0 03/18/2012 0421   CXR: CHEST - 2 VIEW  Comparison: Portable chest 03/21/2012.  Findings: The right-sided chest tube has been removed. A right  subclavian line is stable. A tiny right apical pneumothorax  persists. There is now extensive right-sided chest wall  subcutaneous emphysema. This is significantly increased from the  prior study. Subcutaneous air extends into the neck bilaterally.  Mediastinal air is present.  IMPRESSION:  1. Significant interval increase in diffuse  subcutaneous  emphysema, predominately along the right chest wall extending into  the neck bilaterally.  2. Pneumomediastinum is highly suspected.  3. Interval removal of a right-sided chest tube.  4. The right subclavian line is stable.  These results were called by telephone on 03/22/2012 at 07:15 a.m.  to Chrissie Noa, RN, [phone], who verbally acknowledged these results.  Original Report Authenticated By: Jamesetta Orleans. MATTERN, M.D.   Assessment/Plan: S/P Procedure(s) (LRB): THORACOTOMY/LOBECTOMY (Right)  Dramatic increase in subcutaneous emphysema R>L with increased wheezing on exam - I suspect there is right anterior pneumothorax with persistent small air leak, exacerbated by underlying acute on chronic respiratory failure, COPD  Will replace right chest tube  Management of COPD, resp failure per Dr Craige Cotta  Continue antibiotics  Acute renal failure of unclear etiology  Stop Toradol  Replace foley  Agree with plans per Dr Craige Cotta  Delerium appears stable/resolved  DMII with improved glycemic control but metformin being held - watch for now and continue lantus + SSI  Jeneane Pieczynski H 03/22/2012 8:02 AM

## 2012-03-22 NOTE — Op Note (Signed)
CARDIOTHORACIC SURGERY OPERATIVE NOTE  Date of Procedure:  03/22/2012  Preoperative Diagnosis: Right Pneumothorax  Postoperative Diagnosis: Same  Procedure:   Right chest tube placement  Surgeon:   Salvatore Decent. Cornelius Moras, MD  Anesthesia: 1% lidocaine local with intravenous sedation    DETAILS OF THE OPERATIVE PROCEDURE  Following full informed consent the patient was given midazolam 2 mg intravenously and continuously monitored for rhythm, BP and oxygen saturation. The right chest was prepared and draped in a sterile manner. 1% lidocaine was utilized to anesthetize the skin and subcutaneous tissues. An incision was made just anterior to the patient's thoracotomy incision and a 28 French straight chest tube was placed through the incision into the pleural space.  There was an immediate rush of air upon entry into the pleural space.  The tube was secured to the skin and connected to a closed suction collection device. The patient tolerated the procedure well. A portable CXR was ordered. There were no complications.    Salvatore Decent. Cornelius Moras, MD

## 2012-03-22 NOTE — Progress Notes (Signed)
Name: [name] MRN: [id number] DOB: 07-06-1951    LOS: 5  [facility] / Critical Care Note   History of Present Illness: 61 y/o M,obese, smoker (2ppd) with extensive medical history admitted on 5/12 with 6 mo hx of exertional dyspnea (followed by Dr. Marchelle Gearing).  Was having R sided chest wall pain and CT scan demonstrated in February 1.4 x 1.3 cm irregular cavitary nodule in the posterior segment of the right upper lobe. PET scan performed which showed mild hypermetabolic activity within this nodule with a maximum SUV of 2.6. R.  On 02/28/2012 repeat CT which showed an increase in the size of this cavitary nodule to 1.6 x 1.8 cm. 5/13 underwent Right thoracotomy, wedge resection of right upper lobe lung nodules with frozen section, right upper lobectomy and mediastinal lymph node dissection.      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  Antibiotics: 5/14 Vanco (bil infiltrates)>>> 5/14 Zosyn (bil infiltrates)>>>  Tests / Events: 5/18 Acute renal failure, Subcutaneous air  Subjective:  C/o wheeze.  Denies chest pain.  Still has cough.  Decreased urine outpt.  Subcutaneous air on CXR.  Vital Signs: Temp:  [97.6 F (36.4 C)-99 F (37.2 C)] 98.6 F (37 C) (05/18 0400) Pulse Rate:  [60-120] 110  (05/18 0700) Resp:  [14-23] 20  (05/18 0700) BP: (104-145)/(57-109) 126/68 mmHg (05/18 0700) SpO2:  [87 %-98 %] 93 % (05/18 0700) Weight:  [302 lb 14.6 oz (137.4 kg)] 302 lb 14.6 oz (137.4 kg) (05/18 0600) I/O last 3 completed shifts: In: 3390 [P.O.:1720; I.V.:220; IV Piggyback:1450] Out: 2725 [Urine:2575; Chest Tube:150]  Physical Examination: General - sitting in chair HEENT - no sinus tenderness Cardiac - s1s2 regular Chest -  prolonged exhalation, b/l expiratory wheeze, subcutaneous air on palpation Abd - soft, nontender Ext - ankle edema Neuro - normal strength Psych - normal mood, behavior  Labs    CBC  Lab 03/22/12 0425 03/21/12 0415 03/20/12 0400  HGB 9.8* 9.6* 10.5*  HCT 29.0* 28.4* 31.3*  WBC 12.7* 14.5* 17.0*  PLT 272 261 237    BMET  Lab 03/22/12 0425 03/21/12 0415 03/20/12 0400 03/19/12 0400 03/18/12 0429  NA 136 134* 136 136 138  K 4.4 3.7 -- -- --  CL 96 93* 97 97 98  CO2 24 28 26 26 27   GLUCOSE 89 207* 166* 136* 110*  BUN 34* 17 13 16 12   CREATININE 3.16* 1.17 0.83 0.96 0.96  CALCIUM 6.8* 6.8* 6.9* 6.6* 5.9*  MG -- -- -- -- --  PHOS -- -- -- -- --     Lab 03/18/12 0421  PHART 7.368  PCO2ART 47.6*  PO2ART 63.0*  HCO3 27.4*  TCO2 29  O2SAT 91.0   Dg Chest 2 View  03/22/2012  *RADIOLOGY REPORT*  Clinical Data: Status post right upper lobectomy.  CHEST - 2 VIEW  Comparison: Portable chest 03/21/2012.  Findings: The right-sided chest tube has been removed.  A right subclavian line is stable.  A tiny right apical pneumothorax persists.  There is now extensive right-sided chest wall subcutaneous emphysema. This is significantly increased from the prior study.  Subcutaneous air extends into the neck bilaterally. Mediastinal air is present.  IMPRESSION:  1.  Significant interval increase in diffuse subcutaneous emphysema, predominately along the right chest wall  extending into the neck bilaterally. 2.  Pneumomediastinum is highly suspected. 3.  Interval removal of a right-sided chest tube. 4.  The right subclavian line is stable.  These results were called by telephone on 03/22/2012  at  07:15 a.m. to  Chrissie Noa, RN, [phone], who verbally acknowledged these results.  Original Report Authenticated By: Jamesetta Orleans. MATTERN, M.D.   Dg Chest Port 1 View  03/21/2012  *RADIOLOGY REPORT*  Clinical Data: Postop thoracotomy.  PORTABLE CHEST - 1 VIEW  Comparison: 03/20/2012  Findings: Right chest tube  and right central line remain in place, unchanged.  Small right apical pneumothorax, new or better visualize since prior study.  Patchy bilateral airspace disease slightly improved in the right lung.  No real change on the left. Heart is borderline in size.  IMPRESSION: Small right apical pneumothorax, new since prior study.  Improved right lung airspace opacities.  Original Report Authenticated By: Cyndie Chime, M.D.      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  CXR 5/14 - 5/17 with Edema +/- PNA.  Subcutaneous air on CXR 05/18. Plan: -D5/x vancomycin, zosyn -defer evaluation of subcutaneous air to TCTS -pulmonary toilet and mobilize -change spiriva to atrovent for now -continue scheduled xopenex -Oncology Consult per Primary SVC -Auto-set CPAP QHS -add inhaled ICS>>if wheeze no better, then may need to add systemic steriods -wean nicotine patch as tolerated  Post-op delirium, ? Also component EtOH withdrawal>>reports last drink 5 months prior to admission -Ativan ordered with CIWA assessment  Acute renal failure -check CVP to guide fluid therapy -check bladder scan>>if increased urine volume then place foley -f/u renal function -pharmacy dose adjusting Abx -d/c toradol  DM -d/c metformin with elevated creatinine -continue SSI, lantus  Coralyn Helling, MD 03/22/2012, 7:44 AM Pager:  [phone]  Critical care time 35 minutes

## 2012-03-23 ENCOUNTER — Inpatient Hospital Stay (HOSPITAL_COMMUNITY): Payer: Managed Care, Other (non HMO)

## 2012-03-23 LAB — BASIC METABOLIC PANEL
BUN: 44 mg/dL — ABNORMAL HIGH (ref 6–23)
Calcium: 7.1 mg/dL — ABNORMAL LOW (ref 8.4–10.5)
GFR calc non Af Amer: 16 mL/min — ABNORMAL LOW (ref >90)
Glucose, Bld: 144 mg/dL — ABNORMAL HIGH (ref 70–99)

## 2012-03-23 LAB — CBC
MCH: 29.2 pg (ref 26.0–34.0)
MCHC: 33.2 g/dL (ref 30.0–36.0)
Platelets: 268 10*3/uL (ref 150–400)

## 2012-03-23 LAB — GLUCOSE, CAPILLARY
Glucose-Capillary: 127 mg/dL — ABNORMAL HIGH (ref 70–99)
Glucose-Capillary: 99 mg/dL (ref 70–99)
Glucose-Capillary: 98 mg/dL (ref 70–99)

## 2012-03-23 MED ORDER — ALBUTEROL SULFATE (5 MG/ML) 0.5% IN NEBU
2.5000 mg | INHALATION_SOLUTION | RESPIRATORY_TRACT | Status: DC | PRN
  Administered 2012-03-24: 2.5 mg via RESPIRATORY_TRACT
  Filled 2012-03-23: qty 0.5

## 2012-03-23 MED ORDER — INSULIN GLARGINE 100 UNIT/ML ~~LOC~~ SOLN
40.0000 [IU] | Freq: Two times a day (BID) | SUBCUTANEOUS | Status: DC
  Administered 2012-03-23 – 2012-03-25 (×5): 40 [IU] via SUBCUTANEOUS

## 2012-03-23 MED ORDER — ALBUTEROL SULFATE (5 MG/ML) 0.5% IN NEBU
2.5000 mg | INHALATION_SOLUTION | Freq: Four times a day (QID) | RESPIRATORY_TRACT | Status: DC
  Administered 2012-03-24 – 2012-03-26 (×10): 2.5 mg via RESPIRATORY_TRACT
  Filled 2012-03-23 (×10): qty 0.5

## 2012-03-23 MED ORDER — BISACODYL 10 MG RE SUPP
10.0000 mg | Freq: Once | RECTAL | Status: AC
  Administered 2012-03-23: 10 mg via RECTAL
  Filled 2012-03-23: qty 1

## 2012-03-23 NOTE — Progress Notes (Signed)
CBG: 46  Treatment: D50 IV 25 mL  Symptoms: None  Follow-up CBG: Time:0135 CBG Result:127  Possible Reasons for Event: inadequate meal intake along with medication   Comments/MD notified:    Akhilesh Sassone B

## 2012-03-23 NOTE — Progress Notes (Signed)
   CARDIOTHORACIC SURGERY PROGRESS NOTE  6 Days Post-Op  S/P Procedure(s) (LRB): THORACOTOMY/LOBECTOMY (Right)  Subjective: Feels a little better today.  Still dyspneic.  SubQ emphysema improved.  No bowel movement.  Eating fine but abdomen feels tight.  Objective: Vital signs in last 24 hours: Temp:  [97.5 F (36.4 C)-98.7 F (37.1 C)] 98.2 F (36.8 C) (05/19 0749) Pulse Rate:  [92-122] 109  (05/19 1000) Cardiac Rhythm:  [-] Sinus tachycardia (05/19 0800) Resp:  [13-25] 18  (05/19 1000) BP: (108-201)/(48-81) 115/48 mmHg (05/19 1000) SpO2:  [84 %-99 %] 93 % (05/19 1000)  Physical Exam:  Rhythm:   Sinus tach  Breath sounds: Scattered wheezes and rhonchi, subQ air decreased  Heart sounds:  RRR  Incisions:  Clean and dry  Abdomen:  Distended but soft, non tender  Extremities:  Warm adequately perfused  Chest tube(s):  + air leak   Intake/Output from previous day: 05/18 0701 - 05/19 0700 In: 2910 [P.O.:1130; I.V.:1030; IV Piggyback:750] Out: 1805 [Urine:1605; Chest Tube:200] Intake/Output this shift: Total I/O In: 325 [I.V.:300; IV Piggyback:25] Out: 425 [Urine:375; Chest Tube:50]  Lab Results:  [facility] 03/23/12 0453 03/22/12 0425  WBC 12.2* 12.7*  HGB 8.9* 9.8*  HCT 26.8* 29.0*  PLT 268 272   BMET:  Basename 03/23/12 0453 03/22/12 0425  NA 132* 136  K 4.3 4.4  CL 93* 96  CO2 24 24  GLUCOSE 144* 89  BUN 44* 34*  CREATININE 3.70* 3.16*  CALCIUM 7.1* 6.8*    CBG (last 3)   Basename 03/23/12 0747 03/23/12 0355 03/23/12 0134  GLUCAP 99 147* 127*    CXR:  Stable - subQ air decreased  Assessment/Plan: S/P Procedure(s) (LRB): THORACOTOMY/LOBECTOMY (Right)  Overall somewhat improved Acute on chronic resp failure with COPD, tobacco, OSA, s/p right thoracotomy for wedge resection lung CA Postop recurrent PTX and air leak requiring chest tube replacement yesterday Acute renal failure - etiology unclear - making good UOP, on gentle IV hydration Postop  delerium, resolved Type II diabetes mellitus, labile glycemic control, hypoglycemic overnight Possible post op ileus    Continue chest tube to suction  Mobilize, pulm toilet  Continue Vanc + Zosyn  Continue bronchodilators  Continue gentle IV hydration and watch renal function  Decrease lantus insulin  Check KUB r/o ileus  Try dulcolax supp  Kealan Buchan H 03/23/2012 10:15 AM

## 2012-03-23 NOTE — Progress Notes (Addendum)
TCTS BRIEF SICU PROGRESS NOTE  6 Days Post-Op  S/P Procedure(s) (LRB): THORACOTOMY/LOBECTOMY (Right)   Stable day Reports feeling better Gets very SOB with activity Abdomen tight but passing flatus, no discomfort, non tender KUB with possible mild ileus  Plan: Continue current plan.  Will keep diet on clear liquids for now.  Deshun Sedivy H 03/23/2012 6:14 PM

## 2012-03-23 NOTE — Progress Notes (Signed)
Name: [name] MRN: [id number] DOB: 1951/05/20    LOS: 6  [facility] / Critical Care Note   History of Present Illness: 61 y/o M,obese, smoker (2ppd) with extensive medical history admitted on 5/12 with 6 mo hx of exertional dyspnea (followed by Dr. Marchelle Gearing).  Was having R sided chest wall pain and CT scan demonstrated in February 1.4 x 1.3 cm irregular cavitary nodule in the posterior segment of the right upper lobe. PET scan performed which showed mild hypermetabolic activity within this nodule with a maximum SUV of 2.6. R.  On 02/28/2012 repeat CT which showed an increase in the size of this cavitary nodule to 1.6 x 1.8 cm. 5/13 underwent Right thoracotomy, wedge resection of right upper lobe lung nodules with frozen section, right upper lobectomy and mediastinal lymph node dissection.      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  Antibiotics: 5/14 Vanco (bil infiltrates)>>> 5/14 Zosyn (bil infiltrates)>>>  Tests / Events: 5/18 Acute renal failure, Subcutaneous air, Rt chest tube replaced.    Subjective:  Breathing better.  Still has cough, but decreased.  Chest pain improved.   Vital Signs: Temp:  [97.5 F (36.4 C)-98.7 F (37.1 C)] 98.2 F (36.8 C) (05/19 0749) Pulse Rate:  [92-122] 115  (05/19 0800) Resp:  [13-25] 18  (05/19 0800) BP: (108-201)/(45-81) 141/69 mmHg (05/19 0800) SpO2:  [84 %-99 %] 94 % (05/19 0901) I/O last 3 completed shifts: In: 3605 [P.O.:1190; I.V.:1030; IV Piggyback:1385] Out: 1805 [Urine:1605; Chest Tube:200]  CVP 11  Physical Examination: General - sitting up in bed HEENT - no sinus tenderness Cardiac - s1s2 regular Chest - prolonged exhalation, no wheeze, Rt chest  tube in place Abd - soft, nontender Ext - ankle edema Neuro - normal strength Psych - normal mood, behavior  Labs    CBC  Lab 03/23/12 0453 03/22/12 0425 03/21/12 0415  HGB 8.9* 9.8* 9.6*  HCT 26.8* 29.0* 28.4*  WBC 12.2* 12.7* 14.5*  PLT 268 272 261    BMET  Lab 03/23/12 0453 03/22/12 0425 03/21/12 0415 03/20/12 0400 03/19/12 0400  NA 132* 136 134* 136 136  K 4.3 4.4 -- -- --  CL 93* 96 93* 97 97  CO2 24 24 28 26 26   GLUCOSE 144* 89 207* 166* 136*  BUN 44* 34* 17 13 16   CREATININE 3.70* 3.16* 1.17 0.83 0.96  CALCIUM 7.1* 6.8* 6.8* 6.9* 6.6*  MG -- -- -- -- --  PHOS -- -- -- -- --     Dg Chest 2 View  03/22/2012  *RADIOLOGY REPORT*  Clinical Data: Status post right upper lobectomy.  CHEST - 2 VIEW  Comparison: Portable chest 03/21/2012.  Findings: The right-sided chest tube has been removed.  A right subclavian line is stable.  A tiny right apical pneumothorax persists.  There is now extensive right-sided chest wall subcutaneous emphysema. This is significantly increased from the prior study.  Subcutaneous air extends into the neck bilaterally. Mediastinal air is present.  IMPRESSION:  1.  Significant interval increase in diffuse subcutaneous emphysema, predominately along the right chest wall extending into the neck bilaterally. 2.  Pneumomediastinum is highly suspected. 3.  Interval removal of a right-sided chest tube. 4.  The right subclavian line is stable.  These results  were called by telephone on 03/22/2012  at  07:15 a.m. to  Chrissie Noa, RN, [phone], who verbally acknowledged these results.  Original Report Authenticated By: Jamesetta Orleans. MATTERN, M.D.   Dg Chest Port 1 View  03/22/2012  *RADIOLOGY REPORT*  Clinical Data: Right-sided chest tube.  Pneumothorax.  PORTABLE CHEST - 1 VIEW  Comparison: Two-view chest 03/22/2012.  Findings: A new right-sided chest tube is in place.  A small right apical pneumothorax is still suspected.  Extensive subcutaneous emphysema is similar  to the prior study.  Pneumomediastinum is still suspected as well.  Mild interstitial airspace disease is stable.  The heart is enlarged.  IMPRESSION:  1.  Interval placement of right-sided chest tube.  A small right apical pneumothorax is still suspected. 2.  Stable appearance of diffuse subcutaneous emphysema over the right chest and bilateral neck. 3.  Probable pneumomediastinum. 4.  Stable cardiomegaly and interstitial disease.  Original Report Authenticated By: Jamesetta Orleans. MATTERN, M.D.      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  CXR 5/14 - 5/17 with Edema +/- PNA.  Subcutaneous air on CXR 05/18. Plan: -D6/x vancomycin, zosyn -Rt chest tube per TCTS -pulmonary hygiene -continue scheduled xopenex, atrovent, pulmicort via nebulizer -Will need oncology evaluation  -Auto-set CPAP QHS -May need to add systemic steriods if bronchospasm persists -wean nicotine patch as tolerated  Post-op delirium, ? Also component EtOH withdrawal>>reports last drink 5 months prior to admission.  Normal mental status 5/19. -prn Ativan based on CIWA assessment.  Acute renal failure.  Foley placed 5/18.  Creatinine up slightly 5/19, but making urine. -check CVP to guide fluid therapy -f/u renal function -pharmacy dose adjusting Abx  DM -continue SSI, lantus  Coralyn Helling, MD 03/23/2012, 9:23 AM Pager:  [phone]

## 2012-03-24 ENCOUNTER — Inpatient Hospital Stay (HOSPITAL_COMMUNITY): Payer: Managed Care, Other (non HMO)

## 2012-03-24 LAB — CBC
HCT: 28.4 % — ABNORMAL LOW (ref 39.0–52.0)
Hemoglobin: 9.5 g/dL — ABNORMAL LOW (ref 13.0–17.0)
MCH: 29.5 pg (ref 26.0–34.0)
MCHC: 33.5 g/dL (ref 30.0–36.0)
RDW: 14 % (ref 11.5–15.5)

## 2012-03-24 LAB — GLUCOSE, CAPILLARY: Glucose-Capillary: 97 mg/dL (ref 70–99)

## 2012-03-24 LAB — BASIC METABOLIC PANEL
BUN: 48 mg/dL — ABNORMAL HIGH (ref 6–23)
Chloride: 97 mEq/L (ref 96–112)
Creatinine, Ser: 3.68 mg/dL — ABNORMAL HIGH (ref 0.50–1.35)
Glucose, Bld: 87 mg/dL (ref 70–99)
Potassium: 4.4 mEq/L (ref 3.5–5.1)

## 2012-03-24 MED ORDER — ENOXAPARIN SODIUM 30 MG/0.3ML ~~LOC~~ SOLN
30.0000 mg | SUBCUTANEOUS | Status: DC
  Administered 2012-03-24 – 2012-03-27 (×4): 30 mg via SUBCUTANEOUS
  Filled 2012-03-24 (×5): qty 0.3

## 2012-03-24 NOTE — Progress Notes (Signed)
7 Days Post-Op Procedure(s) (LRB): THORACOTOMY/LOBECTOMY (Right) Subjective: No complaints  Objective: Vital signs in last 24 hours: Temp:  [95.8 F (35.4 C)-99.5 F (37.5 C)] 97.7 F (36.5 C) (05/20 0743) Pulse Rate:  [89-139] 120  (05/20 0800) Cardiac Rhythm:  [-] Sinus tachycardia (05/20 0721) Resp:  [12-24] 12  (05/20 0800) BP: (115-175)/(48-127) 119/63 mmHg (05/20 0800) SpO2:  [86 %-96 %] 92 % (05/20 0800) Weight:  [140.8 kg (310 lb 6.5 oz)] 140.8 kg (310 lb 6.5 oz) (05/20 0400) Had a few small bowel movements yesterday Hemodynamic parameters for last 24 hours: CVP:  [11 mmHg-13 mmHg] 13 mmHg  Intake/Output from previous day: 05/19 0701 - 05/20 0700 In: 2550 [I.V.:2400; IV Piggyback:150] Out: 3510 [Urine:3360; Chest Tube:150] Intake/Output this shift: Total I/O In: 100 [I.V.:100] Out: 200 [Urine:200]  General appearance: alert and cooperative Neurologic: intact Heart: regular rate and rhythm, S1, S2 normal, no murmur, click, rub or gallop Lungs: wheezes bilaterally Abdomen: soft, non-tender; bowel sounds normal; no masses,  no organomegaly Extremities: extremities normal, atraumatic, no cyanosis or edema Wound: incision ok  Lab Results:  Basename 03/24/12 0400 03/23/12 0453  WBC 13.7* 12.2*  HGB 9.5* 8.9*  HCT 28.4* 26.8*  PLT 299 268   BMET:  Basename 03/24/12 0400 03/23/12 0453  NA 137 132*  K 4.4 4.3  CL 97 93*  CO2 24 24  GLUCOSE 87 144*  BUN 48* 44*  CREATININE 3.68* 3.70*  CALCIUM 8.0* 7.1*    PT/INR: No results found for this basename: LABPROT,INR in the last 72 hours ABG    Component Value Date/Time   PHART 7.368 03/18/2012 0421   HCO3 27.4* 03/18/2012 0421   TCO2 29 03/18/2012 0421   ACIDBASEDEF 2.0 01/05/2009 1120   O2SAT 91.0 03/18/2012 0421   CBG (last 3)   Basename 03/24/12 0741 03/24/12 0401 03/23/12 2330  GLUCAP 90 80 108*   CXR:  Aeration seems a little better.  SubQ air stable.  I don't see a ptx.  Assessment/Plan: S/P  Procedure(s) (LRB): THORACOTOMY/LOBECTOMY (Right) Postop pneumonia:  Continue broad spectrum antibiotics for now.  Sputum culture never sent. Postop pneumothorax with subcutaneous emphysema:  Continue chest tube.  Minimal air leak so will put to 10cm suction.  Postop acute renal failure:  Probably secondary to toradol and diuresis. Creat has plateaud and he has excellent urine output.  Will decrease IV to [facility] since taking po well  Advance diet as tolerated since bowels working.  Continue IS. Ambulation as tolerated. He desats with any activity.   LOS: 7 days    Teddrick Mallari K 03/24/2012

## 2012-03-24 NOTE — Progress Notes (Signed)
Patient ID: [name], male   DOB: May 02, 1951, 61 y.o.   MRN: [id number]                   [address]            [place] [address]          [phone]     7 Days Post-Op Procedure(s) (LRB): THORACOTOMY/LOBECTOMY (Right)  Total Length of Stay:  LOS: 7 days  BP 111/50  Pulse 92  Temp(Src) 98.9 F (37.2 C) (Oral)  Resp 13  Ht 5\' 11"  (1.803 m)  Wt 310 lb 6.5 oz (140.8 kg)  BMI 43.29 kg/m2  SpO2 90%     . sodium chloride 20 mL/hr (03/24/12 0828)     Lab Results  Component Value Date   WBC 13.7* 03/24/2012   HGB 9.5* 03/24/2012   HCT 28.4* 03/24/2012   PLT 299 03/24/2012   GLUCOSE 87 03/24/2012   CHOL  Value: 151        ATP III CLASSIFICATION:  <200     mg/dL   Desirable  213-086  mg/dL   Borderline High  >=578    mg/dL   High        4/69/6295   TRIG 234* 11/17/2010   HDL 31* 11/17/2010   LDLCALC  Value: 73        Total Cholesterol/HDL:CHD Risk Coronary Heart Disease Risk Table                     Men   Women  1/2 Average Risk   3.4   3.3  Average Risk       5.0   4.4  2 X Average Risk   9.6   7.1  3 X Average Risk  23.4   11.0        Use the calculated Patient Ratio above and the CHD Risk Table to determine the patient's CHD Risk.        ATP III CLASSIFICATION (LDL):  <100     mg/dL   Optimal  284-132  mg/dL   Near or Above                    Optimal  130-159  mg/dL   Borderline  440-102  mg/dL   High  >725     mg/dL   Very High 3/66/4403   ALT 34 03/19/2012   AST 79* 03/19/2012   NA 137 03/24/2012   K 4.4 03/24/2012   CL 97 03/24/2012   CREATININE 3.68* 03/24/2012   BUN 48* 03/24/2012   CO2 24 03/24/2012   TSH 1.499 *Test methodology is 3rd generation TSH 01/06/2009   INR 1.00 03/13/2012   HGBA1C  Value: 9.8 (NOTE)                                                                       According to the ADA Clinical Practice Recommendations for 2011, when HbA1c is used as a screening test:   >=6.5%   Diagnostic of Diabetes Mellitus           (if abnormal result  is  confirmed)  5.7-6.4%   Increased risk of developing Diabetes  [place]  References:Diagnosis and Classification of Diabetes Mellitus,Diabetes Care,2011,34(Suppl 1):S62-S69 and Standards of Medical Care in         Diabetes - 2011,Diabetes Care,2011,34  (Suppl 1):S11-S61.* 11/17/2010   CR 3.68 Small air leak  Delight Ovens MD  Beeper [phone] Office [phone] 03/24/2012 4:38 PM

## 2012-03-24 NOTE — Progress Notes (Signed)
Name: [name] MRN: [id number] DOB: 05-01-1951    LOS: 7  [facility] / Critical Care Note   History of Present Illness: 61 y/o M,obese, smoker (2ppd) with extensive medical history admitted on 5/12 with 6 mo hx of exertional dyspnea (followed by Dr. Marchelle Gearing).  Was having R sided chest wall pain and CT scan demonstrated in February 1.4 x 1.3 cm irregular cavitary nodule in the posterior segment of the right upper lobe. PET scan performed which showed mild hypermetabolic activity within this nodule with a maximum SUV of 2.6. R.  On 02/28/2012 repeat CT which showed an increase in the size of this cavitary nodule to 1.6 x 1.8 cm. 5/13 underwent Right thoracotomy, wedge resection of right upper lobe lung nodules with frozen section, right upper lobectomy and mediastinal lymph node dissection.      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  Antibiotics: 5/14 Vanco (bil infiltrates)>>> 5/14 Zosyn (bil infiltrates)>>>  Tests / Events: 5/18 Acute renal failure, Subcutaneous air, Rt chest tube replaced.    Subjective:  Feels better.  Chest pain improved.  Not as much cough.  Vital Signs: Temp:  [95.8 F (35.4 C)-99.5 F (37.5 C)] 97.7 F (36.5 C) (05/20 0743) Pulse Rate:  [89-139] 120  (05/20 0800) Resp:  [12-24] 12  (05/20 0800) BP: (115-175)/(48-127) 119/63 mmHg (05/20 0800) SpO2:  [86 %-96 %] 92 % (05/20 0800) Weight:  [310 lb 6.5 oz (140.8 kg)] 310 lb 6.5 oz (140.8 kg) (05/20 0400) I/O last 3 completed shifts: In: 3860 [P.O.:360; I.V.:3300; IV Piggyback:200] Out: 4380 [Urine:4140; Chest Tube:240]  CVP 13  Physical Examination: General - sitting up in bed HEENT - no sinus tenderness Cardiac - s1s2  regular Chest - prolonged exhalation, faint b/l wheeze, Rt chest tube in place Abd - soft, nontender Ext - ankle edema Neuro - normal strength Psych - normal mood, behavior  Labs    CBC  Lab 03/24/12 0400 03/23/12 0453 03/22/12 0425  HGB 9.5* 8.9* 9.8*  HCT 28.4* 26.8* 29.0*  WBC 13.7* 12.2* 12.7*  PLT 299 268 272    BMET  Lab 03/24/12 0400 03/23/12 0453 03/22/12 0425 03/21/12 0415 03/20/12 0400  NA 137 132* 136 134* 136  K 4.4 4.3 -- -- --  CL 97 93* 96 93* 97  CO2 24 24 24 28 26   GLUCOSE 87 144* 89 207* 166*  BUN 48* 44* 34* 17 13  CREATININE 3.68* 3.70* 3.16* 1.17 0.83  CALCIUM 8.0* 7.1* 6.8* 6.8* 6.9*  MG -- -- -- -- --  PHOS -- -- -- -- --     Dg Chest Port 1 View  03/24/2012  *RADIOLOGY REPORT*  Clinical Data: Evaluate pneumothorax  PORTABLE CHEST - 1 VIEW  Comparison: 03/23/2012; 03/22/2012; 03/21/2012  Findings: Grossly unchanged enlarged cardiac silhouette and mediastinal contours.  Stable positioning of support apparatus. Grossly unchanged tiny right apical pneumothorax. The amount of right lateral chest wall subcutaneous air emphysema has minimally decreased in the interval.  Overall improved aeration of the lungs, in particular the left mid lung, with persistent right basilar heterogeneous opacities.  No new focal airspace opacities. Pulmonary venous congestion without frank evidence of edema.  Small right-sided pleural effusion is suspected.  Grossly unchanged bones.  IMPRESSION: 1.  Stable positioning  of support apparatus.  Unchanged tiny right apical pneumothorax. 2.  Overall improved aeration of the lungs with persistent small right-sided pleural effusion and right basilar opacities, possibly atelectasis or contusion.  3. Pulmonary venous congestion without frank evidence of edema.  Original Report Authenticated By: Waynard Reeds, M.D.   Dg Chest Port 1 View  03/23/2012  *RADIOLOGY REPORT*  Clinical Data: Follow up pneumonia  PORTABLE CHEST - 1 VIEW  Comparison:  03/22/2012  Findings: Extensive subcutaneous gas is present in the right chest wall and bilateral neck.  This is similar.  Right chest tube is in place.  Small right apical pneumothorax is difficult to see.  The right subclavian central venous catheter tip in the right innominate vein is unchanged.  Bilateral airspace disease is present, with some progression on the right.  This may be pneumonia or edema.  IMPRESSION: Right apical pneumothorax is difficult to see on today's study.  Bilateral airspace disease shows mild progression  Original Report Authenticated By: Camelia Phenes, M.D.   Dg Chest Port 1 View  03/22/2012  *RADIOLOGY REPORT*  Clinical Data: Right-sided chest tube.  Pneumothorax.  PORTABLE CHEST - 1 VIEW  Comparison: Two-view chest 03/22/2012.  Findings: A new right-sided chest tube is in place.  A small right apical pneumothorax is still suspected.  Extensive subcutaneous emphysema is similar to the prior study.  Pneumomediastinum is still suspected as well.  Mild interstitial airspace disease is stable.  The heart is enlarged.  IMPRESSION:  1.  Interval placement of right-sided chest tube.  A small right apical pneumothorax is still suspected. 2.  Stable appearance of diffuse subcutaneous emphysema over the right chest and bilateral neck. 3.  Probable pneumomediastinum. 4.  Stable cardiomegaly and interstitial disease.  Original Report Authenticated By: Jamesetta Orleans. MATTERN, M.D.   Dg Abd Portable 2v  03/23/2012  *RADIOLOGY REPORT*  Clinical Data: Thoracotomy 6 days ago.  Distended abdomen.  PORTABLE ABDOMEN - 2 VIEW  Comparison: Chest x-ray 03/23/2012  Findings: Limited study.  This is a portable examination of a large patient  and there is significant motion degrading image quality.  Gaseous distention of large bowel suggesting ileus.  Negative for pneumoperitoneum on the decubitus view.  However, there is extensive subcutaneous gas in the right chest and right abdomen from pneumothorax  obscuring detection of small amounts of peritoneal air.  IMPRESSION: Limited study.  Probable ileus.  Original Report Authenticated By: Camelia Phenes, M.D.      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  CXR 5/14 - 5/17 with Edema +/- PNA.  Subcutaneous air on CXR 05/18. Plan: -D7/x vancomycin, zosyn -Rt chest tube per TCTS -pulmonary hygiene -continue scheduled xopenex, atrovent, pulmicort via nebulizer -Will need oncology evaluation  -Auto-set CPAP QHS -May need to add systemic steriods if bronchospasm persists>>hold off for now -wean nicotine patch as tolerated  Post-op delirium, ? Also component EtOH withdrawal>>reports last drink 5 months prior to admission.  Normal mental status 5/19. -prn Ativan based on CIWA assessment.  Acute renal failure.  Foley placed 5/18.  Creatinine up slightly 5/19, but making urine.  Creatinine plateau 5/20. -agree with decrease in IV fluid -monitor CVP -f/u renal function -pharmacy dose adjusting Abx  DM -continue SSI, lantus  Coralyn Helling, MD 03/24/2012, 9:08 AM Pager:  [phone]

## 2012-03-24 NOTE — Progress Notes (Signed)
UR Completed. Amanuel Sinkfield, RN, Nurse Case Manager [phone]     

## 2012-03-25 ENCOUNTER — Inpatient Hospital Stay (HOSPITAL_COMMUNITY): Payer: Managed Care, Other (non HMO)

## 2012-03-25 LAB — BASIC METABOLIC PANEL
CO2: 26 mEq/L (ref 19–32)
Calcium: 8.4 mg/dL (ref 8.4–10.5)
Creatinine, Ser: 3.45 mg/dL — ABNORMAL HIGH (ref 0.50–1.35)
GFR calc Af Amer: 20 mL/min — ABNORMAL LOW (ref >90)
GFR calc non Af Amer: 18 mL/min — ABNORMAL LOW (ref >90)

## 2012-03-25 LAB — GLUCOSE, CAPILLARY
Glucose-Capillary: 179 mg/dL — ABNORMAL HIGH (ref 70–99)
Glucose-Capillary: 141 mg/dL — ABNORMAL HIGH (ref 70–99)

## 2012-03-25 LAB — CBC
MCH: 29.2 pg (ref 26.0–34.0)
MCHC: 33.1 g/dL (ref 30.0–36.0)
MCV: 88.3 fL (ref 78.0–100.0)
Platelets: 282 10*3/uL (ref 150–400)
RDW: 14.1 % (ref 11.5–15.5)

## 2012-03-25 MED ORDER — ALBUTEROL SULFATE (5 MG/ML) 0.5% IN NEBU
2.5000 mg | INHALATION_SOLUTION | RESPIRATORY_TRACT | Status: DC | PRN
  Administered 2012-03-26 – 2012-03-28 (×6): 2.5 mg via RESPIRATORY_TRACT
  Filled 2012-03-25 (×6): qty 0.5

## 2012-03-25 NOTE — Progress Notes (Signed)
Name: [name] MRN: [id number] DOB: [birth year]    LOS: 8  [facility] / Critical Care Note   History of Present Illness: 61 y/o M,obese, smoker (2ppd) with extensive medical history admitted on 5/12 with 6 mo hx of exertional dyspnea (followed by Dr. Marchelle Gearing).  Was having R sided chest wall pain and CT scan demonstrated in February 1.4 x 1.3 cm irregular cavitary nodule in the posterior segment of the right upper lobe. PET scan performed which showed mild hypermetabolic activity within this nodule with a maximum SUV of 2.6. R.  On 02/28/2012 repeat CT which showed an increase in the size of this cavitary nodule to 1.6 x 1.8 cm. 5/13 underwent Right thoracotomy, wedge resection of right upper lobe lung nodules with frozen section, right upper lobectomy and mediastinal lymph node dissection.      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  Antibiotics: 5/14 Vanco (bil infiltrates)>>> 5/14 Zosyn (bil infiltrates)>>>  Tests / Events: 5/18 Acute renal failure, Subcutaneous air, Rt chest tube replaced.    Subjective:  Still has air leak.  Chest pain improved.  Breathing better.  Still has intermittent wheeze.  Vital Signs: Temp:  [97.3 F (36.3 C)-98.9 F (37.2 C)] 98 F (36.7 C) (05/21 0728) Pulse Rate:  [85-132] 132  (05/21 0700) Resp:  [12-96] 20  (05/21 0700) BP: (107-159)/(50-94) 149/66 mmHg (05/21 0700) SpO2:  [89 %-99 %] 91 % (05/21 0734) Weight:  [308 lb 10.3 oz (140 kg)] 308 lb 10.3 oz (140 kg) (05/21 0600) I/O last 3 completed shifts: In: 4333 [P.O.:2100; I.V.:1508; IV Piggyback:725] Out: 5830 [Urine:5425; Chest Tube:405]  CVP 6  Physical Examination: General - sitting up in chair HEENT - no  sinus tenderness Cardiac - s1s2 regular Chest - prolonged exhalation, faint wheeze Rt>Lt, Rt chest tube in place Abd - soft, nontender Ext - no edema Neuro - normal strength Psych - normal mood, behavior  Labs    CBC  Lab 03/25/12 0436 03/24/12 0400 03/23/12 0453  HGB 9.2* 9.5* 8.9*  HCT 27.8* 28.4* 26.8*  WBC 13.6* 13.7* 12.2*  PLT 282 299 268    BMET  Lab 03/25/12 0436 03/24/12 0400 03/23/12 0453 03/22/12 0425 03/21/12 0415  NA 137 137 132* 136 134*  K 5.0 4.4 -- -- --  CL 98 97 93* 96 93*  CO2 26 24 24 24 28   GLUCOSE 85 87 144* 89 207*  BUN 48* 48* 44* 34* 17  CREATININE 3.45* 3.68* 3.70* 3.16* 1.17  CALCIUM 8.4 8.0* 7.1* 6.8* 6.8*  MG -- -- -- -- --  PHOS -- -- -- -- --   CBG (last 3)   Basename 03/25/12 0726 03/25/12 0332 03/24/12 2330  GLUCAP 84 96 135*      Dg Chest Port 1 View  03/24/2012  *RADIOLOGY REPORT*  Clinical Data: Evaluate pneumothorax  PORTABLE CHEST - 1 VIEW  Comparison: 03/23/2012; 03/22/2012; 03/21/2012  Findings: Grossly unchanged enlarged cardiac silhouette and mediastinal contours.  Stable positioning of support apparatus. Grossly unchanged tiny right apical pneumothorax. The amount of right lateral chest wall subcutaneous air emphysema has minimally decreased in the interval.  Overall improved aeration of the lungs, in particular the left mid lung, with persistent right basilar heterogeneous opacities.  No new focal airspace opacities.  Pulmonary venous congestion without frank evidence of edema.  Small right-sided pleural effusion is suspected.  Grossly unchanged bones.  IMPRESSION: 1.  Stable positioning of support apparatus.  Unchanged tiny right apical pneumothorax. 2.  Overall improved aeration of the lungs with persistent small right-sided pleural effusion and right basilar opacities, possibly atelectasis or contusion.  3. Pulmonary venous congestion without frank evidence of edema.  Original Report Authenticated By: Waynard Reeds, M.D.   Dg  Abd Portable 2v  03/23/2012  *RADIOLOGY REPORT*  Clinical Data: Thoracotomy 6 days ago.  Distended abdomen.  PORTABLE ABDOMEN - 2 VIEW  Comparison: Chest x-ray 03/23/2012  Findings: Limited study.  This is a portable examination of a large patient  and there is significant motion degrading image quality.  Gaseous distention of large bowel suggesting ileus.  Negative for pneumoperitoneum on the decubitus view.  However, there is extensive subcutaneous gas in the right chest and right abdomen from pneumothorax obscuring detection of small amounts of peritoneal air.  IMPRESSION: Limited study.  Probable ileus.  Original Report Authenticated By: Camelia Phenes, M.D.      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  CXR 5/14 - 5/17 with Edema +/- PNA.  Subcutaneous air on CXR 05/18. Plan: -D7/x vancomycin, zosyn>>can likely d/c abx soon -Rt chest tube per TCTS -pulmonary hygiene -continue scheduled xopenex, atrovent, pulmicort via nebulizer -Will need oncology evaluation  -Auto-set CPAP QHS -May need to add systemic steriods if bronchospasm persists>>hold for now -wean nicotine patch as tolerated>>plan change to 7 mg 5/23  Post-op delirium, ? Also component EtOH withdrawal>>reports last drink 5 months prior to admission.  Normal mental status since 5/19. -prn Ativan  Acute renal failure.  Foley placed 5/18.  Creatinine trending down, and good urine outpt. -monitor CVP -f/u renal function -pharmacy dose adjusting Abx -keep foley in for now  DM -continue SSI, lantus  Coralyn Helling, MD 03/25/2012, 7:49 AM Pager:  [phone]

## 2012-03-25 NOTE — Significant Event (Signed)
Pt ambulated in the halls, walked 310 feet. Pt ambulated with 6L [place]. Pt not in distress during ambulation. O2 saturations without true waveforms as SpO2 sensor in a finger of left hand that is holding on to handlebar of wheelchair, showing high 70 to 88% monitor.   Pt did had one brief rest stop.  Pt returned to room, settled in chair, O2 saturation in the 90-94%. Will monitor. Cate Oravec, Charity fundraiser.

## 2012-03-25 NOTE — Progress Notes (Signed)
8 Days Post-Op Procedure(s) (LRB): THORACOTOMY/LOBECTOMY (Right) Subjective: No complaints.  Had BM this am.  Objective: Vital signs in last 24 hours: Temp:  [97.3 F (36.3 C)-98.9 F (37.2 C)] 98 F (36.7 C) (05/21 0728) Pulse Rate:  [85-132] 132  (05/21 0700) Cardiac Rhythm:  [-] Normal sinus rhythm (05/21 0301) Resp:  [12-96] 20  (05/21 0700) BP: (107-159)/(50-94) 149/66 mmHg (05/21 0700) SpO2:  [89 %-99 %] 91 % (05/21 0734) Weight:  [140 kg (308 lb 10.3 oz)] 140 kg (308 lb 10.3 oz) (05/21 0600)  Hemodynamic parameters for last 24 hours:    Intake/Output from previous day: 05/20 0701 - 05/21 0700 In: 3070.5 [P.O.:2100; I.V.:308; IV Piggyback:662.5] Out: 4105 [Urine:3800; Chest Tube:305] Intake/Output this shift:    General appearance: alert and cooperative Neurologic: intact Heart: regular rate and rhythm, S1, S2 normal, no murmur, click, rub or gallop Lungs: wheezes bilaterally Abdomen: soft, non-tender; bowel sounds normal; no masses,  no organomegaly Extremities: extremities normal, atraumatic, no cyanosis or edema Wound: incision healing well Small air leak from chest tube Lab Results:  Basename 03/25/12 0436 03/24/12 0400  WBC 13.6* 13.7*  HGB 9.2* 9.5*  HCT 27.8* 28.4*  PLT 282 299   BMET:  Basename 03/25/12 0436 03/24/12 0400  NA 137 137  K 5.0 4.4  CL 98 97  CO2 26 24  GLUCOSE 85 87  BUN 48* 48*  CREATININE 3.45* 3.68*  CALCIUM 8.4 8.0*    PT/INR: No results found for this basename: LABPROT,INR in the last 72 hours ABG    Component Value Date/Time   PHART 7.368 03/18/2012 0421   HCO3 27.4* 03/18/2012 0421   TCO2 29 03/18/2012 0421   ACIDBASEDEF 2.0 01/05/2009 1120   O2SAT 91.0 03/18/2012 0421   CBG (last 3)   Basename 03/25/12 0726 03/25/12 0332 03/24/12 2330  GLUCAP 84 96 135*   CXR:  Stable bilat air space disease  Assessment/Plan: S/P Procedure(s) (LRB): THORACOTOMY/LOBECTOMY (Right) Postop pneumonia:  Slowly improving.  Continue  antibiotics, bronchodilators,pulm toilet Acute postop renal failure:  Resolving with good urine output. Delerium: resolved DM:  Under good control Continue right chest tube to 10cm suction   LOS: 8 days    Warda Mcqueary K 03/25/2012

## 2012-03-25 NOTE — Progress Notes (Signed)
ANTIBIOTIC CONSULT NOTE - FOLLOW UP  Pharmacy Consult for Vancomycin and Zosyn Indication: pneumonia  No Known Allergies  Patient Measurements: Height: 5\' 11"  (180.3 cm) Weight: 308 lb 10.3 oz (140 kg) IBW/kg (Calculated) : 75.3  Adjusted Body Weight: 100 kg  Vital Signs: Temp: 98 F (36.7 C) (05/21 0728) Temp src: Oral (05/21 0728) BP: 149/66 mmHg (05/21 0700) Pulse Rate: 132  (05/21 0700) Intake/Output from previous day: 05/20 0701 - 05/21 0700 In: 3070.5 [P.O.:2100; I.V.:308; IV Piggyback:662.5] Out: 4105 [Urine:3800; Chest Tube:305] Intake/Output from this shift:    Labs:  Basename 03/25/12 0436 03/24/12 0400 03/23/12 0453  WBC 13.6* 13.7* 12.2*  HGB 9.2* 9.5* 8.9*  PLT 282 299 268  LABCREA -- -- --  CREATININE 3.45* 3.68* 3.70*   Estimated Creatinine Clearance: 31.8 ml/min (by C-G formula based on Cr of 3.45). No results found for this basename: VANCOTROUGH:2,VANCOPEAK:2,VANCORANDOM:2,GENTTROUGH:2,GENTPEAK:2,GENTRANDOM:2,TOBRATROUGH:2,TOBRAPEAK:2,TOBRARND:2,AMIKACINPEAK:2,AMIKACINTROU:2,AMIKACIN:2, in the last 72 hours   Microbiology: Recent Results (from the past 720 hour(s))  SURGICAL PCR SCREEN     Status: Normal   Collection Time   03/13/12 11:35 AM      Component Value Range Status Comment   MRSA, PCR NEGATIVE  NEGATIVE  Final    Staphylococcus aureus NEGATIVE  NEGATIVE  Final     Anti-infectives     Start     Dose/Rate Route Frequency Ordered Stop   03/24/12 1200   vancomycin (VANCOCIN) 2,000 mg in sodium chloride 0.9 % 500 mL IVPB        2,000 mg 250 mL/hr over 120 Minutes Intravenous Every 48 hours 03/22/12 1354     03/21/12 1200   vancomycin (VANCOCIN) 1,500 mg in sodium chloride 0.9 % 500 mL IVPB  Status:  Discontinued        1,500 mg 250 mL/hr over 120 Minutes Intravenous Every 12 hours 03/21/12 1130 03/22/12 1354   03/19/12 2200   vancomycin (VANCOCIN) IVPB 1000 mg/200 mL premix  Status:  Discontinued        1,000 mg 200 mL/hr over 60  Minutes Intravenous Every 12 hours 03/19/12 0927 03/21/12 1131   03/19/12 1000   vancomycin (VANCOCIN) 2,000 mg in sodium chloride 0.9 % 500 mL IVPB        2,000 mg 250 mL/hr over 120 Minutes Intravenous  Once 03/19/12 0927 03/19/12 1233   03/19/12 1000   piperacillin-tazobactam (ZOSYN) IVPB 3.375 g        3.375 g 12.5 mL/hr over 240 Minutes Intravenous Every 8 hours 03/19/12 0928     03/17/12 2000   cefUROXime (ZINACEF) 1.5 g in dextrose 5 % 50 mL IVPB        1.5 g 100 mL/hr over 30 Minutes Intravenous Every 12 hours 03/17/12 1518 03/18/12 0827   03/16/12 1506   cefUROXime (ZINACEF) 1.5 g in dextrose 5 % 50 mL IVPB        1.5 g 100 mL/hr over 30 Minutes Intravenous 60 min pre-op 03/16/12 1506 03/17/12 0829          Assessment: PNA:  On Day #7 of Vancomycin and Zosyn for PNA. No cultures available.  Pt is afebrile. WBC around 13k. Scr slowly trending down. UOP @ 1.80ml/kg/hr. Consider dc abx at 8 days for PNA.   Goal of Therapy:  Vancomycin trough level 15-20 mcg/ml  Plan:  Vancomycin to 2000mg  IV q48h Continue Zosyn 3.375gm IV q8h extended infusion Monitor renal function and urine output closely as additional adjustments may be required.

## 2012-03-25 NOTE — Significant Event (Signed)
Pt ambulated 310 feet in hall. Ambulated on 8L [place]. Returned to room and settled in chair. Will monitor. Chontel Warning, Charity fundraiser.

## 2012-03-25 NOTE — Progress Notes (Signed)
TCTS BRIEF SICU PROGRESS NOTE  8 Days Post-Op  S/P Procedure(s) (LRB): THORACOTOMY/LOBECTOMY (Right)   Stable day  Plan: Continue current plan  Haydn Cush H 03/25/2012 6:30 PM

## 2012-03-26 ENCOUNTER — Inpatient Hospital Stay (HOSPITAL_COMMUNITY): Payer: Managed Care, Other (non HMO)

## 2012-03-26 LAB — BASIC METABOLIC PANEL
BUN: 49 mg/dL — ABNORMAL HIGH (ref 6–23)
Calcium: 8.7 mg/dL (ref 8.4–10.5)
Creatinine, Ser: 3.16 mg/dL — ABNORMAL HIGH (ref 0.50–1.35)
GFR calc non Af Amer: 20 mL/min — ABNORMAL LOW (ref >90)
Glucose, Bld: 107 mg/dL — ABNORMAL HIGH (ref 70–99)

## 2012-03-26 LAB — GLUCOSE, CAPILLARY: Glucose-Capillary: 106 mg/dL — ABNORMAL HIGH (ref 70–99)

## 2012-03-26 LAB — CBC
Hemoglobin: 8.4 g/dL — ABNORMAL LOW (ref 13.0–17.0)
MCH: 29 pg (ref 26.0–34.0)
MCHC: 32.8 g/dL (ref 30.0–36.0)
Platelets: 305 10*3/uL (ref 150–400)
RDW: 14.1 % (ref 11.5–15.5)

## 2012-03-26 MED ORDER — INSULIN GLARGINE 100 UNIT/ML ~~LOC~~ SOLN
60.0000 [IU] | Freq: Every day | SUBCUTANEOUS | Status: DC
  Administered 2012-03-26 – 2012-04-04 (×10): 60 [IU] via SUBCUTANEOUS

## 2012-03-26 MED ORDER — TIOTROPIUM BROMIDE MONOHYDRATE 18 MCG IN CAPS
18.0000 ug | ORAL_CAPSULE | Freq: Every day | RESPIRATORY_TRACT | Status: DC
  Administered 2012-03-26 – 2012-04-03 (×9): 18 ug via RESPIRATORY_TRACT
  Filled 2012-03-26: qty 5

## 2012-03-26 MED ORDER — BUDESONIDE-FORMOTEROL FUMARATE 160-4.5 MCG/ACT IN AERO
2.0000 | INHALATION_SPRAY | Freq: Two times a day (BID) | RESPIRATORY_TRACT | Status: DC
  Administered 2012-03-26 – 2012-04-03 (×17): 2 via RESPIRATORY_TRACT
  Filled 2012-03-26: qty 6

## 2012-03-26 NOTE — Progress Notes (Signed)
CBG: 47 Treatment: D50 IV 25 mL  Symptoms: Pale and Sweaty  Follow-up CBG: Time:0105 CBG Result:99  Possible Reasons for Event: Inadequate meal intake and Medication regimen: insulin regimine changes  Comments/MD notified:n/a    Aleighya Mcanelly, Lollie Marrow

## 2012-03-26 NOTE — Progress Notes (Signed)
CBG:45  Treatment: 15 GM carbohydrate snack  Symptoms: Pale and Sweaty  Follow-up CBG: Time:0051 CBG Result:47  Possible Reasons for Event: Inadequate meal intake  Comments/MD notified:n/a    [name], Lollie Marrow

## 2012-03-26 NOTE — Progress Notes (Signed)
Name: [name] MRN: [id number] DOB: 04-06-1951    LOS: 9  [facility] Critical Care Note   History of Present Illness: 61 y/o M,obese, smoker (2ppd) with extensive medical history admitted on 5/12 with 6 mo hx of exertional dyspnea (followed by Dr. Marchelle Gearing).  Was having R sided chest wall pain and CT scan demonstrated in February 1.4 x 1.3 cm irregular cavitary nodule in the posterior segment of the right upper lobe. PET scan performed which showed mild hypermetabolic activity within this nodule with a maximum SUV of 2.6. R.  On 02/28/2012 repeat CT which showed an increase in the size of this cavitary nodule to 1.6 x 1.8 cm. 5/13 underwent Right thoracotomy, wedge resection of right upper lobe lung nodules with frozen section, right upper lobectomy and mediastinal lymph node dissection.      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  Antibiotics: 5/14 Vanco (bil infiltrates)>>> plan 5/24 5/14 Zosyn (bil infiltrates)>>> plan 5/24  Tests / Events: 5/18 Acute renal failure, Subcutaneous air, Rt chest tube replaced.    Subjective:  Still has air leak.  Wearing BiPAP reliably. Walked around the unit yesterday  Vital Signs: Temp:  [97 F (36.1 C)-98.1 F (36.7 C)] 97.1 F (36.2 C) (05/22 0810) Pulse Rate:  [85-138] 98  (05/22 0800) Resp:  [13-23] 19  (05/22 0800) BP: (104-160)/(45-101) 148/64 mmHg (05/22 0800) SpO2:  [86 %-96 %] 88 % (05/22 0800) Weight:  [139.6 kg (307 lb 12.2 oz)] 139.6 kg (307 lb 12.2 oz) (05/22 0600) I/O last 3 completed shifts: In: 1972.9 [P.O.:1680; I.V.:80; IV Piggyback:212.9] Out: 5970 [Urine:5800; Chest Tube:170]  Physical Examination: General - sitting up in chair HEENT - no  sinus tenderness Cardiac - s1s2 regular Chest - prolonged exhalation, faint wheeze Rt>Lt, Rt chest tube in place Abd - soft, nontender Ext - no edema Neuro - normal strength Psych - normal mood, behavior  Labs    CBC  Lab 03/26/12 0325 03/25/12 0436 03/24/12 0400  HGB 8.4* 9.2* 9.5*  HCT 25.6* 27.8* 28.4*  WBC 13.5* 13.6* 13.7*  PLT 305 282 299    BMET  Lab 03/26/12 0325 03/25/12 0436 03/24/12 0400 03/23/12 0453 03/22/12 0425  NA 137 137 137 132* 136  K 4.9 5.0 -- -- --  CL 100 98 97 93* 96  CO2 26 26 24 24 24   GLUCOSE 107* 85 87 144* 89  BUN 49* 48* 48* 44* 34*  CREATININE 3.16* 3.45* 3.68* 3.70* 3.16*  CALCIUM 8.7 8.4 8.0* 7.1* 6.8*  MG -- -- -- -- --  PHOS -- -- -- -- --   CBG (last 3)   Basename 03/26/12 0808 03/26/12 0324 03/26/12 0105  GLUCAP 90 106* 99      Dg Chest Port 1 View  03/25/2012  *RADIOLOGY REPORT*  Clinical Data: Postoperative pneumonia status post right upper lobectomy.  PORTABLE CHEST - 1 VIEW  Comparison: 03/24/2012 and 03/23/2012.  Findings: 0610 hours.  Right subclavian central venous catheter and right chest tube remain in place.  No definite residual pneumothorax is seen.  There is improving diffuse soft tissue emphysema.  Bilateral air space opacities are stable.  Heart size and mediastinal contours are unchanged.  IMPRESSION:  1.  Improved chest wall soft tissue emphysema.  No  definite residual pneumothorax. 2.  No significant change in bilateral air space opacities.  Original Report Authenticated By: Gerrianne Scale, M.D.      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  CXR 5/14 - 5/17 with Edema +/- PNA.  Subcutaneous air on CXR 05/18. Has had some post-op wheeze, most consistent with UA irritation.  Plan: -D8/10 vancomycin, zosyn for possible post-op PNA -Rt chest tube per  TCTS -pulmonary hygiene -change xopenex, atrovent, pulmicort via nebulizer to Spiriva + Symbicort -Will need oncology evaluation  -Auto-set CPAP QHS (or his home device if available) -wean nicotine patch as tolerated>>plan change to 7 mg 5/23  Post-op delirium, ? Also component EtOH withdrawal>>reports last drink 5 months prior to admission.  Normal mental status since 5/19. -prn Ativan  Acute renal failure.  Foley placed 5/18.  Creatinine trending down, and good urine outpt.  Lab 03/26/12 0325 03/25/12 0436 03/24/12 0400 03/23/12 0453 03/22/12 0425  CREATININE 3.16* 3.45* 3.68* 3.70* 3.16*  -monitor CVP and BMP -pharmacy dose adjusting Abx -keep foley in for now, goal d/c 5/23  DM -continue SSI, lantus -holding home metformin with renal failure  Neuropathy - on home neurontin  Depression - on home prozac + trazadone qhs  Levy Pupa, MD, PhD 03/26/2012, 10:43 AM [facility] [phone] or if no answer [phone]

## 2012-03-26 NOTE — Progress Notes (Addendum)
1430pm-ambulated in halls, about 175 feet; ambulated on 6L [place], saturations dropped to 82-88%, then stopped to rest X 2 during ambulation. Pt not in respiratory distress. Returned to room as patient needed to void. Lorie Cleckley, Charity fundraiser.

## 2012-03-26 NOTE — Progress Notes (Signed)
9 Days Post-Op Procedure(s) (LRB): THORACOTOMY/LOBECTOMY (Right) Subjective: No complaints Ambulated twice around ICU but desats into 80's  Objective: Vital signs in last 24 hours: Temp:  [97 F (36.1 C)-98.1 F (36.7 C)] 97.1 F (36.2 C) (05/22 0810) Pulse Rate:  [85-132] 132  (05/22 0900) Cardiac Rhythm:  [-] Normal sinus rhythm;Sinus tachycardia (05/22 0800) Resp:  [13-23] 22  (05/22 0900) BP: (104-160)/(45-101) 113/67 mmHg (05/22 0900) SpO2:  [86 %-96 %] 90 % (05/22 0900) Weight:  [139.6 kg (307 lb 12.2 oz)] 139.6 kg (307 lb 12.2 oz) (05/22 0600)  Hemodynamic parameters for last 24 hours:    Intake/Output from previous day: 05/21 0701 - 05/22 0700 In: 1650.4 [P.O.:1500; IV Piggyback:150.4] Out: 4340 [Urine:4250; Chest Tube:90] Intake/Output this shift: Total I/O In: -  Out: 350 [Urine:350]  General appearance: alert and cooperative Neurologic: intact Heart: regular rate and rhythm, S1, S2 normal, no murmur, click, rub or gallop Lungs: wheezes bilaterally but less so Abdomen: soft, non-tender; bowel sounds normal; no masses,  no organomegaly Extremities: extremities normal, atraumatic, no cyanosis or edema Wound: incision ok  Lab Results:  Basename 03/26/12 0325 03/25/12 0436  WBC 13.5* 13.6*  HGB 8.4* 9.2*  HCT 25.6* 27.8*  PLT 305 282   BMET:  Basename 03/26/12 0325 03/25/12 0436  NA 137 137  K 4.9 5.0  CL 100 98  CO2 26 26  GLUCOSE 107* 85  BUN 49* 48*  CREATININE 3.16* 3.45*  CALCIUM 8.7 8.4    PT/INR: No results found for this basename: LABPROT,INR in the last 72 hours ABG    Component Value Date/Time   PHART 7.368 03/18/2012 0421   HCO3 27.4* 03/18/2012 0421   TCO2 29 03/18/2012 0421   ACIDBASEDEF 2.0 01/05/2009 1120   O2SAT 91.0 03/18/2012 0421   CBG (last 3)   Basename 03/26/12 0808 03/26/12 0324 03/26/12 0105  GLUCAP 90 106* 99   CXR:  Stable bilat airspace disease.  No ptx. Subcutaneous emphysema resolving.  Assessment/Plan: S/P  Procedure(s) (LRB): THORACOTOMY/LOBECTOMY (Right) Postop pneumonia:  Continue antibiotics Acute renal failure:  Improving daily with spontaneous diuresis Delirium: resolved Diabetes:  Hypoglycemia early this am. May be related to Lantus last pm.  He was only taking Lantus in the morning at home.  Will increase am dose to 60 and dc pm dose.  LOS: 9 days    Iraida Cragin K 03/26/2012

## 2012-03-26 NOTE — Progress Notes (Signed)
Inpatient Diabetes Program Recommendations  AACE/ADA: New Consensus Statement on Inpatient Glycemic Control (2009)  Target Ranges:  Prepandial:   less than 140 mg/dL      Peak postprandial:   less than 180 mg/dL (1-2 hours)      Critically ill patients:  140 - 180 mg/dL   Reason for Visit: Results for [name], [name] (MRN [id number]) as of 03/26/2012 09:16  Ref. Range 03/26/2012 00:51 03/26/2012 01:05 03/26/2012 03:24 03/26/2012 08:08  Glucose-Capillary Latest Range: 70-99 mg/dL 47 (L) 99 147 (H) 90   Note low CBG last evening.  Inpatient Diabetes Program Recommendations Correction (SSI): Decrease Novolog correction to sensitive tid wc and HS (discontinue q 4 hour coverage).  Note: Will follow.

## 2012-03-27 ENCOUNTER — Inpatient Hospital Stay (HOSPITAL_COMMUNITY): Payer: Managed Care, Other (non HMO)

## 2012-03-27 DIAGNOSIS — R0602 Shortness of breath: Secondary | ICD-10-CM

## 2012-03-27 LAB — BASIC METABOLIC PANEL
BUN: 49 mg/dL — ABNORMAL HIGH (ref 6–23)
CO2: 26 mEq/L (ref 19–32)
Calcium: 9.1 mg/dL (ref 8.4–10.5)
Creatinine, Ser: 3.01 mg/dL — ABNORMAL HIGH (ref 0.50–1.35)
Glucose, Bld: 140 mg/dL — ABNORMAL HIGH (ref 70–99)

## 2012-03-27 LAB — GLUCOSE, CAPILLARY
Glucose-Capillary: 139 mg/dL — ABNORMAL HIGH (ref 70–99)
Glucose-Capillary: 93 mg/dL (ref 70–99)
Glucose-Capillary: 149 mg/dL — ABNORMAL HIGH (ref 70–99)
Glucose-Capillary: 74 mg/dL (ref 70–99)

## 2012-03-27 MED ORDER — NICOTINE 7 MG/24HR TD PT24
7.0000 mg | MEDICATED_PATCH | Freq: Every day | TRANSDERMAL | Status: DC
  Administered 2012-03-27 – 2012-04-04 (×9): 7 mg via TRANSDERMAL
  Filled 2012-03-27 (×10): qty 1

## 2012-03-27 MED ORDER — GLUCOSE-VITAMIN C 4-6 GM-MG PO CHEW
CHEWABLE_TABLET | ORAL | Status: AC
  Administered 2012-03-27: 1
  Filled 2012-03-27: qty 3

## 2012-03-27 MED ORDER — INSULIN ASPART 100 UNIT/ML ~~LOC~~ SOLN
0.0000 [IU] | Freq: Three times a day (TID) | SUBCUTANEOUS | Status: DC
  Administered 2012-03-28: 4 [IU] via SUBCUTANEOUS
  Administered 2012-03-28 – 2012-03-29 (×3): 2 [IU] via SUBCUTANEOUS
  Administered 2012-03-29: 8 [IU] via SUBCUTANEOUS
  Administered 2012-03-30 – 2012-03-31 (×2): 4 [IU] via SUBCUTANEOUS
  Administered 2012-04-01: 2 [IU] via SUBCUTANEOUS
  Administered 2012-04-02: 4 [IU] via SUBCUTANEOUS
  Administered 2012-04-03 – 2012-04-04 (×4): 2 [IU] via SUBCUTANEOUS

## 2012-03-27 NOTE — Progress Notes (Signed)
Pt refused ambulation this evening, advised me that he would walk later after his visitors have gone.

## 2012-03-27 NOTE — Progress Notes (Signed)
10 Days Post-Op Procedure(s) (LRB): THORACOTOMY/LOBECTOMY (Right) Subjective: No complaints   Objective: Vital signs in last 24 hours: Temp:  [97.6 F (36.4 C)-98.6 F (37 C)] 98.3 F (36.8 C) (05/23 1522) Pulse Rate:  [88-124] 88  (05/23 1700) Cardiac Rhythm:  [-] Normal sinus rhythm (05/23 0822) Resp:  [13-21] 14  (05/23 1300) BP: (123-147)/(52-82) 128/73 mmHg (05/23 1700) SpO2:  [87 %-96 %] 90 % (05/23 1700) Weight:  [139.3 kg (307 lb 1.6 oz)] 139.3 kg (307 lb 1.6 oz) (05/23 0400)  Hemodynamic parameters for last 24 hours:    Intake/Output from previous day: 05/22 0701 - 05/23 0700 In: 1907.5 [P.O.:1320; IV Piggyback:587.5] Out: 4710 [Urine:4600; Chest Tube:110] Intake/Output this shift: Total I/O In: 240 [P.O.:240] Out: 500 [Urine:450; Chest Tube:50]  General appearance: alert and cooperative  Lab Results:  Basename 03/26/12 0325 03/25/12 0436  WBC 13.5* 13.6*  HGB 8.4* 9.2*  HCT 25.6* 27.8*  PLT 305 282   BMET:  Basename 03/27/12 0400 03/26/12 0325  NA 138 137  K 4.8 4.9  CL 100 100  CO2 26 26  GLUCOSE 140* 107*  BUN 49* 49*  CREATININE 3.01* 3.16*  CALCIUM 9.1 8.7    PT/INR: No results found for this basename: LABPROT,INR in the last 72 hours ABG    Component Value Date/Time   PHART 7.368 03/18/2012 0421   HCO3 27.4* 03/18/2012 0421   TCO2 29 03/18/2012 0421   ACIDBASEDEF 2.0 01/05/2009 1120   O2SAT 91.0 03/18/2012 0421   CBG (last 3)   Basename 03/27/12 1521 03/27/12 1144 03/27/12 0754  GLUCAP 141* 149* 93   CXR:  Stable bilateral airspace disease  Assessment/Plan: S/P Procedure(s) (LRB): THORACOTOMY/LOBECTOMY (Right) COPD Postop pneumonia with respiratory failure:  Stable but desats into 70's with ambulation. Continuing antibiotics and bronchodilators. Postop renal failure:  Improving daily Diabetes:  Late evening hypoglycemia but glucose reasonably controlled otherwise.  He needs to eat nighttime snack.   LOS: 10 days    [name]  K 03/27/2012

## 2012-03-27 NOTE — Progress Notes (Signed)
Name: [name] MRN: [id number] DOB: Jan 31, 1951    LOS: 10  [facility] Critical Care Note   History of Present Illness: 61 y/o M,obese, smoker (2ppd) with extensive medical history admitted on 5/12 with 6 mo hx of exertional dyspnea (followed by Dr. Marchelle Gearing).  Was having R sided chest wall pain and CT scan demonstrated in February 1.4 x 1.3 cm irregular cavitary nodule in the posterior segment of the right upper lobe. PET scan performed which showed mild hypermetabolic activity within this nodule with a maximum SUV of 2.6. R.  On 02/28/2012 repeat CT which showed an increase in the size of this cavitary nodule to 1.6 x 1.8 cm. 5/13 underwent Right thoracotomy, wedge resection of right upper lobe lung nodules with frozen section, right upper lobectomy and mediastinal lymph node dissection.      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  Antibiotics: 5/14 Vanco (bil infiltrates)>>> plan 5/24 5/14 Zosyn (bil infiltrates)>>> plan 5/24  Tests / Events: 5/18 Acute renal failure, Subcutaneous air, Rt chest tube replaced.    Subjective:  Still has air leak.  Ambulated around the unit 5/22. Up to the chair currently  Vital Signs: Temp:  [97.6 F (36.4 C)-98.9 F (37.2 C)] 97.6 F (36.4 C) (05/23 0756) Pulse Rate:  [61-132] 118  (05/23 0800) Resp:  [13-26] 16  (05/23 0800) BP: (113-147)/(52-93) 142/68 mmHg (05/23 0800) SpO2:  [87 %-94 %] 91 % (05/23 0800) Weight:  [139.3 kg (307 lb 1.6 oz)] 139.3 kg (307 lb 1.6 oz) (05/23 0400) I/O last 3 completed shifts: In: 2847.5 [P.O.:2160; IV Piggyback:687.5] Out: 7020 [Urine:6840; Chest Tube:180]  Physical Examination: General - sitting up in chair HEENT - no sinus  tenderness Cardiac - s1s2 regular Chest - prolonged exhalation, faint wheeze Rt>Lt, Rt chest tube in place Abd - soft, nontender Ext - no edema Neuro - normal strength Psych - normal mood, behavior  Labs    CBC  Lab 03/26/12 0325 03/25/12 0436 03/24/12 0400  HGB 8.4* 9.2* 9.5*  HCT 25.6* 27.8* 28.4*  WBC 13.5* 13.6* 13.7*  PLT 305 282 299    BMET  Lab 03/27/12 0400 03/26/12 0325 03/25/12 0436 03/24/12 0400 03/23/12 0453  NA 138 137 137 137 132*  K 4.8 4.9 -- -- --  CL 100 100 98 97 93*  CO2 26 26 26 24 24   GLUCOSE 140* 107* 85 87 144*  BUN 49* 49* 48* 48* 44*  CREATININE 3.01* 3.16* 3.45* 3.68* 3.70*  CALCIUM 9.1 8.7 8.4 8.0* 7.1*  MG -- -- -- -- --  PHOS -- -- -- -- --   CBG (last 3)   Basename 03/27/12 0754 03/27/12 0356 03/27/12 0024  GLUCAP 93 139* 87      Dg Chest Port 1 View  03/26/2012  *RADIOLOGY REPORT*  Clinical Data: Follow-up for pneumothorax.  PORTABLE CHEST - 1 VIEW  Comparison: Chest x-ray 03/25/2012.  Findings: There is a right-sided subclavian central venous catheter with tip terminating in the proximal superior vena cava. Right- sided chest tube in place with tip and sideport projecting over the right hemithorax.  No definite right-sided pneumothorax identified. There continues to be a background of extensive patchy interstitial and airspace opacities throughout all aspects of the lungs bilaterally, with some relative  sparing of the right upper lobe. Appearance is nonspecific, but given their asymmetry the findings suggests multilobar pneumonia.  Postoperative changes of right upper lobectomy are again noted.  Bilateral pleural effusions are present (small).  Some cephalization of the pulmonary vasculature is evident.  Mild - moderate enlargement of the cardiopericardial silhouette. The patient is rotated to the left on today's exam, resulting in distortion of the mediastinal contours and reduced diagnostic sensitivity and specificity for mediastinal  pathology.  IMPRESSION: 1.  Support apparatus, as above. 2.  Status post right upper lobectomy.  No significant pneumothorax noted at this time. 3.  Persistent multifocal interstitial and airspace opacities throughout the lungs bilaterally.  Although there may be some component of underlying edema, the asymmetry of the findings raises concern for multilobar pneumonia. 4.  Mild - moderate cardiopericardial silhouette enlargement may suggest underlying cardiomegaly and/or the presence of a pericardial effusion.  Original Report Authenticated By: Florencia Reasons, M.D.      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  CXR 5/14 - 5/17 with Edema +/- PNA.  Subcutaneous air on CXR 05/18. Has had some post-op wheeze, most consistent with UA irritation + his COPD Plan: -D9/10 vancomycin, zosyn for possible post-op PNA -Rt chest tube per TCTS >> still has an air leak -pulmonary hygiene -changed xopenex, atrovent, pulmicort via nebulizer to Spiriva + Symbicort on 5/22 -Will need oncology evaluation  -Auto-set CPAP QHS (or his home device if available) -wean nicotine patch as tolerated>>plan change to 7 mg 5/23  Post-op delirium, ? Also component EtOH withdrawal>>reports last drink 5 months prior to admission.  Normal mental status since 5/19. -follow  Acute renal failure.  Foley placed 5/18.  Creatinine trending down, and good urine outpt.  Lab 03/27/12 0400 03/26/12 0325 03/25/12 0436 03/24/12 0400 03/23/12 0453  CREATININE 3.01* 3.16* 3.45* 3.68* 3.70*  -monitor CVP and BMP -pharmacy dose adjusting Abx -foley out  DM -continue SSI,  -lantus adjusted by Dr Laneta Simmers 5/23 -holding home metformin with renal failure  Neuropathy - on home neurontin  Depression - on home prozac + trazadone qhs  Levy Pupa, MD, PhD 03/27/2012, 8:36  AM [facility] [phone] or if no answer [phone]

## 2012-03-27 NOTE — Progress Notes (Signed)
CBG: 54 Treatment: 3 glucose tabs  Symptoms: None  Follow-up CBG: Time:2035 CBG Result:74  Possible Reasons for Event: Unknown  Comments/MD notified:na    Justice Britain, Roderick Pee

## 2012-03-27 NOTE — Progress Notes (Signed)
Inpatient Diabetes Program Recommendations  AACE/ADA: New Consensus Statement on Inpatient Glycemic Control (2009)  Target Ranges:  Prepandial:   less than 140 mg/dL      Peak postprandial:   less than 180 mg/dL (1-2 hours)      Critically ill patients:  140 - 180 mg/dL   Reason for Visit: Results for [name], [name] (MRN [id number]) as of 03/27/2012 13:29  Ref. Range 03/26/2012 23:43 03/27/2012 00:04 03/27/2012 00:24 03/27/2012 03:56  Glucose-Capillary Latest Range: 70-99 mg/dL 55 (L) 61 (L) 87 509 (H)  Note hypoglycemia last PM.  Inpatient Diabetes Program Recommendations Correction (SSI): Decrease Novolog correction to sensitive tid wc and HS (discontinue q 4 hour coverage). Insulin - Meal Coverage: May also consider decreasing Novolog meal coverage to 12 units tid with meals.

## 2012-03-28 ENCOUNTER — Inpatient Hospital Stay (HOSPITAL_COMMUNITY): Payer: Managed Care, Other (non HMO)

## 2012-03-28 LAB — BASIC METABOLIC PANEL
BUN: 47 mg/dL — ABNORMAL HIGH (ref 6–23)
Calcium: 9.3 mg/dL (ref 8.4–10.5)
GFR calc non Af Amer: 20 mL/min — ABNORMAL LOW (ref >90)
Glucose, Bld: 144 mg/dL — ABNORMAL HIGH (ref 70–99)
Potassium: 5.4 mEq/L — ABNORMAL HIGH (ref 3.5–5.1)

## 2012-03-28 LAB — GLUCOSE, CAPILLARY
Glucose-Capillary: 120 mg/dL — ABNORMAL HIGH (ref 70–99)
Glucose-Capillary: 190 mg/dL — ABNORMAL HIGH (ref 70–99)
Glucose-Capillary: 107 mg/dL — ABNORMAL HIGH (ref 70–99)

## 2012-03-28 MED ORDER — INSULIN ASPART 100 UNIT/ML ~~LOC~~ SOLN
10.0000 [IU] | Freq: Three times a day (TID) | SUBCUTANEOUS | Status: DC
  Administered 2012-03-28 (×2): 10 [IU] via SUBCUTANEOUS
  Administered 2012-03-29: 12 [IU] via SUBCUTANEOUS
  Administered 2012-03-29: 10 [IU] via SUBCUTANEOUS
  Administered 2012-03-29: 09:00:00 via SUBCUTANEOUS
  Administered 2012-03-30 – 2012-04-01 (×7): 10 [IU] via SUBCUTANEOUS

## 2012-03-28 MED ORDER — ENOXAPARIN SODIUM 40 MG/0.4ML ~~LOC~~ SOLN
40.0000 mg | SUBCUTANEOUS | Status: DC
  Administered 2012-03-28 – 2012-04-01 (×5): 40 mg via SUBCUTANEOUS
  Filled 2012-03-28 (×5): qty 0.4

## 2012-03-28 NOTE — Progress Notes (Signed)
Name: [name] MRN: [id number] DOB: 04-13-1951    LOS: 11  [facility] Critical Care Note   History of Present Illness: 61 y/o M,obese, smoker (2ppd) with extensive medical history admitted on 5/12 with 6 mo hx of exertional dyspnea (followed by Dr. Marchelle Gearing).  Was having R sided chest wall pain and CT scan demonstrated in February 1.4 x 1.3 cm irregular cavitary nodule in the posterior segment of the right upper lobe. PET scan performed which showed mild hypermetabolic activity within this nodule with a maximum SUV of 2.6. R.  On 02/28/2012 repeat CT which showed an increase in the size of this cavitary nodule to 1.6 x 1.8 cm. 5/13 underwent Right thoracotomy, wedge resection of right upper lobe lung nodules with frozen section, right upper lobectomy and mediastinal lymph node dissection.      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  Antibiotics: 5/14 Vanco (bil infiltrates)>>> plan 5/24 5/14 Zosyn (bil infiltrates)>>> plan 5/24  Tests / Events: 5/18 Acute renal failure, Subcutaneous air, Rt chest tube replaced.    Subjective:  Still has air leak.  Walked thru the unit, desat to 70's while walking Wearing his CPAP, up to chair now Has completed 10 days abx for ? HCAP  Vital Signs: Temp:  [97.6 F (36.4 C)-98.4 F (36.9 C)] 98.2 F (36.8 C) (05/24 0746) Pulse Rate:  [83-124] 89  (05/24 0700) Resp:  [12-21] 19  (05/24 0700) BP: (117-144)/(41-82) 139/79 mmHg (05/24 0700) SpO2:  [86 %-96 %] 92 % (05/24 0700) Weight:  [138 kg (304 lb 3.8 oz)] 138 kg (304 lb 3.8 oz) (05/24 0600) I/O last 3 completed shifts: In: 1025 [P.O.:900; IV Piggyback:125] Out: 4580 [Urine:4350; Chest Tube:230]  Physical  Examination: General - sitting up in chair HEENT - no sinus tenderness Cardiac - s1s2 regular Chest - prolonged exhalation, faint wheeze Rt>Lt, Rt chest tube in place Abd - soft, nontender Ext - no edema Neuro - normal strength Psych - normal mood, behavior  Labs    CBC  Lab 03/26/12 0325 03/25/12 0436 03/24/12 0400  HGB 8.4* 9.2* 9.5*  HCT 25.6* 27.8* 28.4*  WBC 13.5* 13.6* 13.7*  PLT 305 282 299    BMET  Lab 03/28/12 0415 03/27/12 0400 03/26/12 0325 03/25/12 0436 03/24/12 0400  NA 138 138 137 137 137  K 5.4* 4.8 -- -- --  CL 100 100 100 98 97  CO2 27 26 26 26 24   GLUCOSE 144* 140* 107* 85 87  BUN 47* 49* 49* 48* 48*  CREATININE 3.13* 3.01* 3.16* 3.45* 3.68*  CALCIUM 9.3 9.1 8.7 8.4 8.0*  MG -- -- -- -- --  PHOS -- -- -- -- --   CBG (last 3)   Basename 03/28/12 0748 03/27/12 2138 03/27/12 2035  GLUCAP 120* 117* 74      Dg Chest Port 1 View  03/27/2012  *RADIOLOGY REPORT*  Clinical Data: Right-sided VATS.  PORTABLE CHEST - 1 VIEW  Comparison: 03/26/2012.  Findings: Trachea is midline.  Heart is enlarged.  Right subclavian central line tip projects in the region of the brachiocephalic vein junction.  Right chest tube is in place.  Small right apical pneumothorax.  Diffuse bilateral air space disease persists.  No definite pleural fluid.  Subcutaneous emphysema is  seen in the right chest wall.  IMPRESSION:  1.  Small right pneumothorax with right chest tube in place. 2.  Persistent diffuse bilateral air space disease.  Original Report Authenticated By: Reyes Ivan, M.D.      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  CXR 5/14 - 5/17 with Edema +/- PNA.  Subcutaneous air on CXR 05/18. Has had some post-op wheeze, most consistent with UA irritation + his COPD Plan: -D10/10 vancomycin, zosyn for possible  post-op PNA >> d/c today -Rt chest tube per TCTS >> still has an air leak -pulmonary hygiene -changed xopenex, atrovent, pulmicort via nebulizer to Spiriva + Symbicort on 5/22 -Will need oncology evaluation  -Auto-set CPAP QHS (or his home device if available) -wean nicotine patch as tolerated>> changed to 7 mg 5/23  Post-op delirium, ? Also component EtOH withdrawal>>reports last drink 5 months prior to admission.  Normal mental status since 5/19. -follow  Acute renal failure.  Foley placed 5/18.  Creatinine trending down, and good urine outpt.  Lab 03/28/12 0415 03/27/12 0400 03/26/12 0325 03/25/12 0436 03/24/12 0400  CREATININE 3.13* 3.01* 3.16* 3.45* 3.68*  -monitor CVP and BMP -pharmacy dose adjusting Abx -foley out  DM -continue SSI,  -lantus adjusted by Dr Laneta Simmers 5/23 -holding home metformin with renal failure  Neuropathy - on home neurontin  Depression - on home prozac + trazadone qhs  Levy Pupa, MD, PhD 03/28/2012, 7:55 AM [facility] [phone] or if no answer [phone]

## 2012-03-28 NOTE — Plan of Care (Signed)
Problem: Phase II Progression Outcomes Goal: Tolerates progressive ambulation Outcome: Progressing Ambulated 300 ft this am. Desated to 70's but is recovering more quickly.

## 2012-03-28 NOTE — Progress Notes (Signed)
Inpatient Diabetes Program Recommendations  AACE/ADA: New Consensus Statement on Inpatient Glycemic Control (2009)  Target Ranges:  Prepandial:   less than 140 mg/dL      Peak postprandial:   less than 180 mg/dL (1-2 hours)      Critically ill patients:  140 - 180 mg/dL   Reason for Visit: Hypoglycemia after meals.  Please reduce Novolog meal coverage to 10 units tid with meals (Hold if patient eats less than 50%).

## 2012-03-28 NOTE — Progress Notes (Signed)
11 Days Post-Op Procedure(s) (LRB): THORACOTOMY/LOBECTOMY (Right)  Subjective: No complaints  Objective: Vital signs in last 24 hours: Temp:  [98.2 F (36.8 C)-98.4 F (36.9 C)] 98.2 F (36.8 C) (05/24 0746) Pulse Rate:  [83-124] 85  (05/24 0800) Cardiac Rhythm:  [-] Sinus tachycardia (05/24 0800) Resp:  [12-21] 17  (05/24 0800) BP: (117-144)/(41-82) 137/59 mmHg (05/24 0800) SpO2:  [86 %-96 %] 91 % (05/24 0838) Weight:  [138 kg (304 lb 3.8 oz)] 138 kg (304 lb 3.8 oz) (05/24 0600)  Hemodynamic parameters for last 24 hours:    Intake/Output from previous day: 05/23 0701 - 05/24 0700 In: 290 [P.O.:240; IV Piggyback:50] Out: 2400 [Urine:2250; Chest Tube:150] Intake/Output this shift: Total I/O In: -  Out: 215 [Urine:175; Chest Tube:40]  General appearance: alert and cooperative Neurologic: intact Heart: regular rate and rhythm, S1, S2 normal, no murmur, click, rub or gallop Lungs: wheezes bilaterally Abdomen: soft, non-tender; bowel sounds normal; no masses,  no organomegaly Extremities: extremities normal, atraumatic, no cyanosis or edema Wound: incision ok No air leak from chest tube Lab Results:  Basename 03/26/12 0325  WBC 13.5*  HGB 8.4*  HCT 25.6*  PLT 305   BMET:  Basename 03/28/12 0415 03/27/12 0400  NA 138 138  K 5.4* 4.8  CL 100 100  CO2 27 26  GLUCOSE 144* 140*  BUN 47* 49*  CREATININE 3.13* 3.01*  CALCIUM 9.3 9.1    PT/INR: No results found for this basename: LABPROT,INR in the last 72 hours ABG    Component Value Date/Time   PHART 7.368 03/18/2012 0421   HCO3 27.4* 03/18/2012 0421   TCO2 29 03/18/2012 0421   ACIDBASEDEF 2.0 01/05/2009 1120   O2SAT 91.0 03/18/2012 0421   CBG (last 3)   Basename 03/28/12 0748 03/27/12 2138 03/27/12 2035  GLUCAP 120* 117* 74    Assessment/Plan: S/P Procedure(s) (LRB): THORACOTOMY/LOBECTOMY (Right) Postop pneumonia:  Improving on vanc and zosyn.  Acute renal failure:  Creat has been stable at 3.0 but had  normal creat preop. Urine output good Postop delirium: resolved Diabetes:  Still had hypoglycemic episode last pm so will decrease Novolog meal coverage to 10. Continue mobilization and IS. I am hesitant to move him out of the ICU due to desaturation into the 70's with ambulation, renal failure with creat 3.     LOS: 11 days    Lavoy Bernards K 03/28/2012

## 2012-03-29 ENCOUNTER — Inpatient Hospital Stay (HOSPITAL_COMMUNITY): Payer: Managed Care, Other (non HMO)

## 2012-03-29 LAB — GLUCOSE, CAPILLARY
Glucose-Capillary: 201 mg/dL — ABNORMAL HIGH (ref 70–99)
Glucose-Capillary: 136 mg/dL — ABNORMAL HIGH (ref 70–99)
Glucose-Capillary: 131 mg/dL — ABNORMAL HIGH (ref 70–99)

## 2012-03-29 LAB — BASIC METABOLIC PANEL
BUN: 50 mg/dL — ABNORMAL HIGH (ref 6–23)
CO2: 29 mEq/L (ref 19–32)
Calcium: 10 mg/dL (ref 8.4–10.5)
Chloride: 98 mEq/L (ref 96–112)
Creatinine, Ser: 3.1 mg/dL — ABNORMAL HIGH (ref 0.50–1.35)
GFR calc Af Amer: 23 mL/min — ABNORMAL LOW (ref >90)
GFR calc non Af Amer: 20 mL/min — ABNORMAL LOW (ref >90)
Glucose, Bld: 61 mg/dL — ABNORMAL LOW (ref 70–99)
Potassium: 5.5 mEq/L — ABNORMAL HIGH (ref 3.5–5.1)
Sodium: 139 mEq/L (ref 135–145)

## 2012-03-29 LAB — CBC
HCT: 29.5 % — ABNORMAL LOW (ref 39.0–52.0)
Hemoglobin: 9.7 g/dL — ABNORMAL LOW (ref 13.0–17.0)
RDW: 13.9 % (ref 11.5–15.5)
WBC: 16 10*3/uL — ABNORMAL HIGH (ref 4.0–10.5)

## 2012-03-29 NOTE — Progress Notes (Signed)
Name: [name] MRN: [id number] DOB: 07-23-1951    LOS: 12   Pulmonary / Critical Care Note   History of Present Illness: 61 y/o M,obese, smoker (2ppd)   5/13 underwent Right thoracotomy, wedge resection of right upper lobe lung nodules with frozen section, right upper lobectomy and mediastinal lymph node dissection.      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  Antibiotics: 5/14 Vanco (bil infiltrates)>>> 5/24 5/14 Zosyn (bil infiltrates)>>> 5/24  Tests / Events: 5/18 Acute renal failure, Subcutaneous air, Rt chest tube replaced.    Subjective:  Somewhat improved. Non prod cough. IS to 1500cc   Vital Signs: Temp:  [97.6 F (36.4 C)-98.4 F (36.9 C)] 98.1 F (36.7 C) (05/25 0730) Pulse Rate:  [39-122] 88  (05/25 0900) Resp:  [13-22] 20  (05/25 0900) BP: (99-151)/(52-105) 127/58 mmHg (05/25 0900) SpO2:  [89 %-97 %] 94 % (05/25 0916) Weight:  [136.4 kg (300 lb 11.3 oz)] 136.4 kg (300 lb 11.3 oz) (05/25 0400) I/O last 3 completed shifts: In: 1610 [P.O.:1560; IV Piggyback:50] Out: 6510 [Urine:6200; Chest Tube:310]  Physical Examination: General - sitting up in chair HEENT - no sinus tenderness Cardiac - s1s2 regular Chest - prolonged exhalation, faint wheeze Rt>Lt, Rt chest tube in place Abd - soft, nontender Ext - no edema Neuro - normal strength Psych - normal mood, behavior  Labs    CBC  Lab 03/29/12 0425 03/26/12 0325 03/25/12 0436  HGB 9.7* 8.4* 9.2*  HCT 29.5* 25.6* 27.8*  WBC 16.0* 13.5* 13.6*  PLT 381 305 282    BMET  Lab 03/29/12 0425 03/28/12 0415 03/27/12 0400 03/26/12 0325 03/25/12 0436  NA 139 138 138 137 137  K 5.5* 5.4* -- -- --  CL 98 100 100 100 98  CO2 29 27 26  26 26   GLUCOSE 61* 144* 140* 107* 85  BUN 50* 47* 49* 49* 48*  CREATININE 3.10* 3.13* 3.01* 3.16* 3.45*  CALCIUM 10.0 9.3 9.1 8.7 8.4  MG -- -- -- -- --  PHOS -- -- -- -- --   CBG (last 3)   Basename 03/28/12 2140 03/28/12 1738 03/28/12 1142  GLUCAP 107* 143* 190*      Dg Chest Port 1 View  03/29/2012  *RADIOLOGY REPORT*  Clinical Data: Status post right upper lobectomy.  Postop pneumonia.  Right pneumothorax.  PORTABLE CHEST - 1 VIEW  Comparison: 03/28/2012  Findings: The patient has right-sided central line, tip to the superior vena cava.  Right chest tube remains in place.  Heart is enlarged.  There are patchy parenchymal infiltrates bilaterally, probably unchanged.  Small right pneumothorax persists, less than 5% of lung volume.  IMPRESSION:  1.  Little interval change in bilateral lung infiltrates. 2.  Small right pneumothorax, stable.  Original Report Authenticated By: Patterson Hammersmith, M.D.   Dg Chest Port 1 View  03/28/2012  *RADIOLOGY REPORT*  Clinical Data: Right upper lobectomy.  PORTABLE CHEST - 1 VIEW  Comparison: 03/27/2012  Findings: Stable right chest tube. Improved right apical pneumothorax.  Diffuse bilateral airspace disease is unchanged. Subcutaneous emphysema over the right chest wall stable. Cardiomegaly.  No left pneumothorax.  IMPRESSION: Improved right apical pneumothorax which is now less than 5%. Stable bilateral airspace disease.  Original Report Authenticated By:  Donavan Burnet, M.D.      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  CXR 5/14 - 5/17 with Edema +/- PNA.  Subcutaneous air on CXR 05/18. Has had some post-op wheeze, most consistent with UA irritation + his COPD Plan: -Rt chest tube per TCTS >> still has an air leak -pulmonary hygiene -cont Spiriva + Symbicort  -Will need oncology evaluation   -Auto-set CPAP QHS (or his home device if available)   Post-op delirium, ? Also component EtOH withdrawal Improved >reports last drink 5 months prior to admission.  Normal mental status since 5/19.   Acute renal failure.  Foley placed 5/18.  Creatinine trending down, and good urine outpt.  Lab 03/29/12 0425 03/28/12 0415 03/27/12 0400 03/26/12 0325 03/25/12 0436  CREATININE 3.10* 3.13* 3.01* 3.16* 3.45*  monitor bmet daily  DM -continue SSI,  -lantus adjusted by Dr Laneta Simmers 5/23 -holding home metformin with renal failure  Neuropathy - on home neurontin  Depression - on home prozac + trazadone qhs  Shan Levans  Beeper  [phone]  Cell  [phone]  If no response or cell goes to voicemail, call beeper [phone]  03/29/2012, 9:25 AM

## 2012-03-29 NOTE — Progress Notes (Signed)
12 Days Post-Op Procedure(s) (LRB): THORACOTOMY/LOBECTOMY (Right)                     [address]            [name] [address]          [phone]      SubjectivePOD # 12 R lobectomy pstop pneumonia, R PNTX req replacemnt of CT Postop ATN creat 3.1: Vital signs in last 24 hours: Temp:  [97.6 F (36.4 C)-98.4 F (36.9 C)] 98.1 F (36.7 C) (05/25 0730) Pulse Rate:  [39-122] 88  (05/25 0900) Cardiac Rhythm:  [-] Normal sinus rhythm (05/25 0900) Resp:  [13-22] 20  (05/25 0900) BP: (99-151)/(52-105) 127/58 mmHg (05/25 0900) SpO2:  [89 %-97 %] 94 % (05/25 0900) Weight:  [300 lb 11.3 oz (136.4 kg)] 300 lb 11.3 oz (136.4 kg) (05/25 0400)  Hemodynamic parameters for last 24 hours:  NSR, BP stable  Intake/Output from previous day: 05/24 0701 - 05/25 0700 In: 1560 [P.O.:1560] Out: 4610 [Urine:4400; Chest Tube:210] Intake/Output this shift: Total I/O In: 240 [P.O.:240] Out: 400 [Urine:400]  EXAM  no airleak  Lab Results:  Basename 03/29/12 0425  WBC 16.0*  HGB 9.7*  HCT 29.5*  PLT 381   BMET:  Basename 03/29/12 0425 03/28/12 0415  NA 139 138  K 5.5* 5.4*  CL 98 100  CO2 29 27  GLUCOSE 61* 144*  BUN 50* 47*  CREATININE 3.10* 3.13*  CALCIUM 10.0 9.3    PT/INR: No results found for this basename: LABPROT,INR in the last 72 hours ABG    Component Value Date/Time   PHART 7.368 03/18/2012 0421   HCO3 27.4* 03/18/2012 0421   TCO2 29 03/18/2012 0421   ACIDBASEDEF 2.0 01/05/2009 1120   O2SAT 91.0 03/18/2012 0421   CBG (last 3)   Basename 03/28/12 2140 03/28/12 1738 03/28/12 1142  GLUCAP 107* 143* 190*    Assessment/Plan: S/P Procedure(s) (LRB): THORACOTOMY/LOBECTOMY (Right) Continue ABX therapy due to Post-op infection Leave CT to water seal  LOS: 12 days    [name],[name] 03/29/2012

## 2012-03-29 NOTE — Progress Notes (Signed)
Patient examined and record reviewed.Hemodynamics stable,labs satisfactory.Patient had stable day.Continue current care. Chest tube to water seal [name],Brittanyann Wittner 03/29/2012

## 2012-03-30 ENCOUNTER — Inpatient Hospital Stay (HOSPITAL_COMMUNITY): Payer: Managed Care, Other (non HMO)

## 2012-03-30 LAB — BASIC METABOLIC PANEL
BUN: 51 mg/dL — ABNORMAL HIGH (ref 6–23)
CO2: 30 mEq/L (ref 19–32)
Calcium: 9.8 mg/dL (ref 8.4–10.5)
Chloride: 96 mEq/L (ref 96–112)
Creatinine, Ser: 3.03 mg/dL — ABNORMAL HIGH (ref 0.50–1.35)
GFR calc Af Amer: 24 mL/min — ABNORMAL LOW (ref >90)
GFR calc non Af Amer: 21 mL/min — ABNORMAL LOW (ref >90)
Glucose, Bld: 66 mg/dL — ABNORMAL LOW (ref 70–99)
Potassium: 5.5 mEq/L — ABNORMAL HIGH (ref 3.5–5.1)
Sodium: 135 mEq/L (ref 135–145)

## 2012-03-30 LAB — CBC
HCT: 27.4 % — ABNORMAL LOW (ref 39.0–52.0)
Hemoglobin: 9 g/dL — ABNORMAL LOW (ref 13.0–17.0)
MCH: 29.1 pg (ref 26.0–34.0)
MCHC: 32.8 g/dL (ref 30.0–36.0)
MCV: 88.7 fL (ref 78.0–100.0)
Platelets: 382 10*3/uL (ref 150–400)
RBC: 3.09 MIL/uL — ABNORMAL LOW (ref 4.22–5.81)
RDW: 14.1 % (ref 11.5–15.5)
WBC: 16.7 10*3/uL — ABNORMAL HIGH (ref 4.0–10.5)

## 2012-03-30 LAB — GLUCOSE, CAPILLARY
Glucose-Capillary: 118 mg/dL — ABNORMAL HIGH (ref 70–99)
Glucose-Capillary: 161 mg/dL — ABNORMAL HIGH (ref 70–99)

## 2012-03-30 NOTE — Progress Notes (Signed)
Patient examined and record reviewed.Hemodynamics stable,labs satisfactory.Patient had stable day.Continue current care.   R chest tube removed with good breath sound bilat. [name],[name] 03/30/2012

## 2012-03-30 NOTE — Progress Notes (Signed)
13 Days Post-Op Procedure(s) (LRB): THORACOTOMY/LOBECTOMY (Right) Subjective:                      [address]            [name] [address]          [phone]     R VATS, lobectomy Postop lung injury, pneumonia- improving O2 desat w/ ambulation-improving POST OP Acute renal failure, creat 3.0- stable No air leak or PNTX - DC remaining CT Objective: Vital signs in last 24 hours: Temp:  [97.5 F (36.4 C)-98.2 F (36.8 C)] 97.5 F (36.4 C) (05/26 0724) Pulse Rate:  [80-136] 95  (05/26 1000) Cardiac Rhythm:  [-] Sinus tachycardia (05/26 0800) Resp:  [12-22] 22  (05/26 1000) BP: (119-155)/(41-115) 120/54 mmHg (05/26 1000) SpO2:  [89 %-97 %] 90 % (05/26 1000) Weight:  [296 lb 15.4 oz (134.7 kg)] 296 lb 15.4 oz (134.7 kg) (05/26 0600)  Hemodynamic parameters for last 24 hours:  NSR  Intake/Output from previous day: 05/25 0701 - 05/26 0700 In: 1200 [P.O.:1200] Out: 4226 [Urine:4225; Stool:1] Intake/Output this shift: Total I/O In: 340 [P.O.:340] Out: 400 [Urine:400]  EXAM Breath sounds coarse bilat, incision clean  Lab Results:  Basename 03/30/12 0424 03/29/12 0425  WBC 16.7* 16.0*  HGB 9.0* 9.7*  HCT 27.4* 29.5*  PLT 382 381   BMET:  Basename 03/30/12 0424 03/29/12 0425  NA 135 139  K 5.5* 5.5*  CL 96 98  CO2 30 29  GLUCOSE 66* 61*  BUN 51* 50*  CREATININE 3.03* 3.10*  CALCIUM 9.8 10.0    PT/INR: No results found for this basename: LABPROT,INR in the last 72 hours ABG    Component Value Date/Time   PHART 7.368 03/18/2012 0421   HCO3 27.4* 03/18/2012 0421   TCO2 29 03/18/2012 0421   ACIDBASEDEF 2.0 01/05/2009 1120   O2SAT 91.0 03/18/2012 0421   CBG (last 3)   Basename 03/30/12 0719 03/30/12 0552 03/29/12 2124  GLUCAP 91 104* 131*    Assessment/Plan: S/P Procedure(s) (LRB): THORACOTOMY/LOBECTOMY (Right) Will DC CT, keep in ICU   LOS: 13 days    [name],Jeannemarie Sawaya 03/30/2012

## 2012-03-30 NOTE — Progress Notes (Signed)
CBG: 66 (on labs)  Treatment: D50 IV 25 mL  Symptoms: Sweaty  Follow-up CBG: [id number] CBG Result:104  Possible Reasons for Event: Inadequate meal intake  Comments/MD notified:per protocol-treated with D50 d/t Bipap and pt request    Carlean Jews

## 2012-03-31 ENCOUNTER — Inpatient Hospital Stay (HOSPITAL_COMMUNITY): Payer: Managed Care, Other (non HMO)

## 2012-03-31 LAB — GLUCOSE, CAPILLARY
Glucose-Capillary: 172 mg/dL — ABNORMAL HIGH (ref 70–99)
Glucose-Capillary: 118 mg/dL — ABNORMAL HIGH (ref 70–99)
Glucose-Capillary: 92 mg/dL (ref 70–99)
Glucose-Capillary: 126 mg/dL — ABNORMAL HIGH (ref 70–99)

## 2012-03-31 LAB — CBC
HCT: 29.3 % — ABNORMAL LOW (ref 39.0–52.0)
Hemoglobin: 9.4 g/dL — ABNORMAL LOW (ref 13.0–17.0)
MCH: 28.7 pg (ref 26.0–34.0)
MCHC: 32.1 g/dL (ref 30.0–36.0)
MCV: 89.3 fL (ref 78.0–100.0)
Platelets: 405 10*3/uL — ABNORMAL HIGH (ref 150–400)
RBC: 3.28 MIL/uL — ABNORMAL LOW (ref 4.22–5.81)
RDW: 13.8 % (ref 11.5–15.5)
WBC: 16.6 10*3/uL — ABNORMAL HIGH (ref 4.0–10.5)

## 2012-03-31 LAB — BASIC METABOLIC PANEL
BUN: 53 mg/dL — ABNORMAL HIGH (ref 6–23)
CO2: 32 mEq/L (ref 19–32)
Calcium: 10.2 mg/dL (ref 8.4–10.5)
Chloride: 96 mEq/L (ref 96–112)
Creatinine, Ser: 3.29 mg/dL — ABNORMAL HIGH (ref 0.50–1.35)
GFR calc Af Amer: 22 mL/min — ABNORMAL LOW (ref >90)
GFR calc non Af Amer: 19 mL/min — ABNORMAL LOW (ref >90)
Glucose, Bld: 110 mg/dL — ABNORMAL HIGH (ref 70–99)
Potassium: 5.7 mEq/L — ABNORMAL HIGH (ref 3.5–5.1)
Sodium: 138 mEq/L (ref 135–145)

## 2012-03-31 NOTE — Progress Notes (Signed)
Patient ID: [name], male   DOB: 1950/12/12, 61 y.o.   MRN: [id number] TCTS DAILY PROGRESS NOTE                   [address]            [place] [address]          [phone]      14 Days Post-Op Procedure(s) (LRB): THORACOTOMY/LOBECTOMY (Right)  Total Length of Stay:  LOS: 14 days   Subjective: Feels better, still needs o2 when walking Chest tube out yesterday  Objective: Vital signs in last 24 hours: Temp:  [97.5 F (36.4 C)-98.4 F (36.9 C)] 98.2 F (36.8 C) (05/27 0754) Pulse Rate:  [36-109] 84  (05/27 0700) Cardiac Rhythm:  [-] Normal sinus rhythm (05/26 2000) Resp:  [12-25] 17  (05/27 0700) BP: (109-151)/(46-88) 115/51 mmHg (05/27 0700) SpO2:  [90 %-99 %] 93 % (05/27 0727) Weight:  [294 lb 5 oz (133.5 kg)] 294 lb 5 oz (133.5 kg) (05/27 0500)  Filed Weights   03/29/12 0400 03/30/12 0600 03/31/12 0500  Weight: 300 lb 11.3 oz (136.4 kg) 296 lb 15.4 oz (134.7 kg) 294 lb 5 oz (133.5 kg)    Weight change: -2 lb 10.3 oz (-1.2 kg)   Hemodynamic parameters for last 24 hours:    Intake/Output from previous day: 05/26 0701 - 05/27 0700 In: 920 [P.O.:920] Out: 5200 [Urine:5200]  Intake/Output this shift: Total I/O In: -  Out: 600 [Urine:600]  Current Meds: Scheduled Meds:   . aspirin EC  81 mg Oral Daily  . bisacodyl  10 mg Oral Daily  . budesonide-formoterol  2 puff Inhalation BID  . calcium carbonate  1 tablet Oral BID WC  . diltiazem  30 mg Oral Q6H  . enoxaparin (LOVENOX) injection  40 mg Subcutaneous Q24H  . FLUoxetine  40 mg Oral Daily  . folic acid  1 mg Oral Daily  . gabapentin  300 mg Oral TID  . insulin aspart  0-24 Units Subcutaneous TID WC  . insulin aspart  10 Units Subcutaneous TID WC  . insulin glargine  60 Units Subcutaneous Daily  . metoCLOPramide  10 mg Oral TID AC  . mulitivitamin with minerals  1 tablet Oral Daily  . nicotine  7 mg Transdermal Daily  . pantoprazole  40 mg Oral Q1200  . thiamine  100 mg  Oral Daily  . tiotropium  18 mcg Inhalation Daily  . traZODone  100 mg Oral QHS   Continuous Infusions:  PRN Meds:.albuterol, dextrose, naphazoline-pheniramine, oxyCODONE-acetaminophen, potassium chloride, senna-docusate, traMADol  General appearance: alert, cooperative and no distress Neurologic: intact Heart: regular rate and rhythm, S1, S2 normal, no murmur, click, rub or gallop and normal apical impulse Lungs: wheezes RUL Abdomen: soft, non-tender; bowel sounds normal; no masses,  no organomegaly Extremities: extremities normal, atraumatic, no cyanosis or edema, Homans sign is negative, no sign of DVT and no edema, redness or tenderness in the calves or thighs  Lab Results: CBC: Basename 03/31/12 0355 03/30/12 0424  WBC 16.6* 16.7*  HGB 9.4* 9.0*  HCT 29.3* 27.4*  PLT 405* 382   BMET:  Basename 03/31/12 0355 03/30/12 0424  NA 138 135  K 5.7* 5.5*  CL 96 96  CO2 32 30  GLUCOSE 110* 66*  BUN 53* 51*  CREATININE 3.29* 3.03*  CALCIUM 10.2 9.8    PT/INR: No results found for this basename: LABPROT,INR in the last 72 hours Radiology: Dg Chest  2 View  03/31/2012  *RADIOLOGY REPORT*  Clinical Data: Shortness of breath.  CHEST - 2 VIEW  Comparison: 03/30/2012  Findings: Two views of the chest were obtained.  The right basilar chest tube has been removed.  Again seen is a pleural line with pleural thickening/fluid in the right upper hemithorax.  This is unchanged from the prior examination.  Persistent patchy parenchymal densities at the right lung base and throughout the left lung.  Stable volume loss in the right hemithorax.  IMPRESSION: Removal of right chest tube.  Again seen is a pleural line in the right lung apex which has not significantly changed.  Overall, there is no significant pneumothorax.  Minimal change in the bilateral airspace disease.  Original Report Authenticated By: Richarda Overlie, M.D.   Dg Chest Port 1 View  03/30/2012  *RADIOLOGY REPORT*  Clinical Data: VATS   PORTABLE CHEST - 1 VIEW  Comparison: Chest radiograph 03/29/2012 and multiple recent priors.  Findings: Right subclavian central venous catheter terminates in the proximal superior vena cava.  Right-sided chest tube terminates in the right perihilar region, stable.  Tiny right apical pneumothorax is without significant change.  Stable heart and mediastinal contours.  Diffuse perihilar bibasilar airspace opacities are similar to yesterday's chest radiograph. Persistent subcutaneous emphysema right chest wall laterally.  IMPRESSION:  1.  No significant change in bilateral airspace disease. 2.  Persistent tiny right apical pneumothorax chest tube in place and right chest wall subcutaneous emphysema.  Original Report Authenticated By: Britta Mccreedy, M.D.     Assessment/Plan: S/P Procedure(s) (LRB): THORACOTOMY/LOBECTOMY (Right) Mobilize Diabetes control Plan for transfer to step-down: see transfer orders To 3300 or circle     Delight Ovens MD  Beeper [phone] Office [phone] 03/31/2012 8:23 AM

## 2012-03-31 NOTE — Progress Notes (Signed)
Pt states he places self on home cpap unit

## 2012-03-31 NOTE — Progress Notes (Signed)
Pt ambulated x1 150 ft. assist with 02 at 6L, pt slightly short of breath at the end of ambulation, 02 sats dropped to 79 at lowest. But with rest in chair increased to 92. Pt with out complaints. Will monitor. Cristen Bredeson, Randall An RN

## 2012-04-01 LAB — BASIC METABOLIC PANEL
BUN: 54 mg/dL — ABNORMAL HIGH (ref 6–23)
CO2: 28 mEq/L (ref 19–32)
Calcium: 9.8 mg/dL (ref 8.4–10.5)
Chloride: 94 mEq/L — ABNORMAL LOW (ref 96–112)
Creatinine, Ser: 3.42 mg/dL — ABNORMAL HIGH (ref 0.50–1.35)
GFR calc Af Amer: 21 mL/min — ABNORMAL LOW (ref >90)
GFR calc non Af Amer: 18 mL/min — ABNORMAL LOW (ref >90)
Glucose, Bld: 141 mg/dL — ABNORMAL HIGH (ref 70–99)
Potassium: 6 mEq/L — ABNORMAL HIGH (ref 3.5–5.1)
Sodium: 134 mEq/L — ABNORMAL LOW (ref 135–145)

## 2012-04-01 LAB — CBC
HCT: 28.4 % — ABNORMAL LOW (ref 39.0–52.0)
Hemoglobin: 9.2 g/dL — ABNORMAL LOW (ref 13.0–17.0)
MCH: 28.8 pg (ref 26.0–34.0)
MCHC: 32.4 g/dL (ref 30.0–36.0)
MCV: 89 fL (ref 78.0–100.0)
Platelets: 429 10*3/uL — ABNORMAL HIGH (ref 150–400)
RBC: 3.19 MIL/uL — ABNORMAL LOW (ref 4.22–5.81)
RDW: 13.9 % (ref 11.5–15.5)
WBC: 16.6 10*3/uL — ABNORMAL HIGH (ref 4.0–10.5)

## 2012-04-01 LAB — GLUCOSE, CAPILLARY
Glucose-Capillary: 131 mg/dL — ABNORMAL HIGH (ref 70–99)
Glucose-Capillary: 102 mg/dL — ABNORMAL HIGH (ref 70–99)
Glucose-Capillary: 101 mg/dL — ABNORMAL HIGH (ref 70–99)

## 2012-04-01 MED ORDER — HEPARIN SODIUM (PORCINE) 5000 UNIT/ML IJ SOLN
5000.0000 [IU] | Freq: Three times a day (TID) | INTRAMUSCULAR | Status: DC
  Administered 2012-04-02 – 2012-04-04 (×8): 5000 [IU] via SUBCUTANEOUS
  Filled 2012-04-01 (×10): qty 1

## 2012-04-01 MED ORDER — GABAPENTIN 100 MG PO CAPS
200.0000 mg | ORAL_CAPSULE | Freq: Three times a day (TID) | ORAL | Status: DC
  Administered 2012-04-01 – 2012-04-02 (×2): 200 mg via ORAL
  Filled 2012-04-01 (×4): qty 2

## 2012-04-01 MED ORDER — SODIUM CHLORIDE 0.9 % IV SOLN
INTRAVENOUS | Status: AC
  Administered 2012-04-01 (×2): via INTRAVENOUS

## 2012-04-01 MED ORDER — SODIUM POLYSTYRENE SULFONATE 15 GM/60ML PO SUSP
15.0000 g | Freq: Once | ORAL | Status: AC
  Administered 2012-04-01: 15 g via ORAL
  Filled 2012-04-01: qty 60

## 2012-04-01 NOTE — Progress Notes (Signed)
Pt already setup on his home unit BiPAP. Tolerating well.

## 2012-04-01 NOTE — Progress Notes (Signed)
Name: [name] MRN: [id number] DOB: Sep 18, 1951    LOS: 15  [facility] / Critical Care Note   History of Present Illness: 61 y/o M,obese, smoker (2ppd)   5/13 underwent Right thoracotomy, wedge resection of right upper lobe lung nodules with frozen section, right upper lobectomy and mediastinal lymph node dissection.      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  Antibiotics: 5/14 Vanco (bil infiltrates)>>> 5/24 5/14 Zosyn (bil infiltrates)>>> 5/24  Tests / Events: 5/18 Acute renal failure, Subcutaneous air, Rt chest tube replaced.   5/28  - off chest tube, creat worsening but makng urine and getting stronger. Pain resolved. Delirium resolved. Off chest tube  Subjective:   5/28  - off chest tube, creat worsening but makng urine and getting stronger. Pain resolved. Delirium resolved. Off chest tube  Vital Signs: Temp:  [97 F (36.1 C)-98.7 F (37.1 C)] 97 F (36.1 C) (05/28 1354) Pulse Rate:  [82-99] 82  (05/28 1354) Resp:  [19-20] 20  (05/28 1354) BP: (100-124)/(60-83) 114/65 mmHg (05/28 1354) SpO2:  [94 %-99 %] 94 % (05/28 1354) Weight:  [131.7 kg (290 lb 5.5 oz)] 131.7 kg (290 lb 5.5 oz) (05/28 0418) I/O last 3 completed shifts: In: 1090 [P.O.:1090] Out: 4751 [Urine:4750; Stool:1]  Physical Examination: General - sitting up in chair. Looks better than he does when I see him in office HEENT - no sinus tenderness Cardiac - s1s2 regular Chest - prolonged exhalation,no wheeze, no chest tube. NOTE: R Subclavian line in place + Abd - soft, nontender Ext - no edema Neuro - normal strength Psych - normal mood, behavior  Labs    CBC  Lab 04/01/12 0610 03/31/12 0355 03/30/12 0424  HGB 9.2*  9.4* 9.0*  HCT 28.4* 29.3* 27.4*  WBC 16.6* 16.6* 16.7*  PLT 429* 405* 382    BMET  Lab 04/01/12 0610 03/31/12 0355 03/30/12 0424 03/29/12 0425 03/28/12 0415  NA 134* 138 135 139 138  K 6.0* 5.7* -- -- --  CL 94* 96 96 98 100  CO2 28 32 30 29 27   GLUCOSE 141* 110* 66* 61* 144*  BUN 54* 53* 51* 50* 47*  CREATININE 3.42* 3.29* 3.03* 3.10* 3.13*  CALCIUM 9.8 10.2 9.8 10.0 9.3  MG -- -- -- -- --  PHOS -- -- -- -- --   CBG (last 3)   Basename 04/01/12 1124 04/01/12 0611 03/31/12 2040  GLUCAP 102* 131* 126*      Dg Chest 2 View  03/31/2012  *RADIOLOGY REPORT*  Clinical Data: Shortness of breath.  CHEST - 2 VIEW  Comparison: 03/30/2012  Findings: Two views of the chest were obtained.  The right basilar chest tube has been removed.  Again seen is a pleural line with pleural thickening/fluid in the right upper hemithorax.  This is unchanged from the prior examination.  Persistent patchy parenchymal densities at the right lung base and throughout the left lung.  Stable volume loss in the right hemithorax.  IMPRESSION: Removal of right chest tube.  Again seen is a pleural line in the right lung apex which has not significantly changed.  Overall, there is no significant pneumothorax.  Minimal change in the bilateral airspace disease.  Original Report Authenticated By: Richarda Overlie, M.D.  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  CXR 5/14 - 5/17 with Edema +/- PNA.  Subcutaneous air on CXR 05/18. Has had some post-op wheeze, most consistent with UA irritation + his COPD   - on 5/28: resp status near baseline. Off chest tube  Plan: -pulmonary hygiene -cont Spiriva + Symbicort  -Auto-set CPAP QHS (or his home device if available) - likely does not need chemo   Post-op delirium, ? Also component EtOH withdrawal Improved  >reports last drink 5 months prior to admission.  Normal mental status since 5/19 and 5/28 PLAN  - monitor   Acute renal failure.  Foley placed 5/18.  Creatinine trending down, and good urine outpt as of 5/19 but worsenned again 5/28 but made 2L urine Intake/Output      05/27 0701 - 05/28 0700 05/28 0701 - 05/29 0700   P.O. 1090 360   Total Intake(mL/kg) 1090 (8.3) 360 (2.7)   Urine (mL/kg/hr) 2050 (0.6) 1600 (1.3)   Stool 1    Total Output 2051 1600   Net -961 -1240        Estimated Creatinine Clearance: 31 ml/min (by C-G formula based on Cr of 3.42).   Lab 04/01/12 0610 03/31/12 0355 03/30/12 0424 03/29/12 0425 03/28/12 0415  K 6.0* 5.7* 5.5* 5.5* 5.4*    Lab 04/01/12 0610 03/31/12 0355 03/30/12 0424 03/29/12 0425 03/28/12 0415  CREATININE 3.42* 3.29* 3.03* 3.10* 3.13*  monitor bmet daily Hold home metformin due to continue renal failure Pharmacy to dose adjust lovenox on 04/01/12 Reduce neurontin home dose from 300mg  tid to 200mg  tid on 04/01/12 based on GFR DC reglan (denies nausea) DC ultram (not needing it) Kayexalate (given by CVTS) If creat worsening, would consider renal consult    Neuropathy - on home neurontin but reduce dose 04/01/12 due to low renal function  - percocet prn for post op pain - dc tramadol (not needing it)  DM  - ssi   - hold off metformin due to renal function  Depression - on home prozac + trazadone qhs    Dr. Kalman Shan, M.D., [facility].C.P Pulmonary and Critical Care Medicine Staff Physician [facility] Pager: [phone], If no answer or between  15:00h - 7:00h: call [phone]  04/01/2012 4:34 PM

## 2012-04-01 NOTE — Progress Notes (Signed)
Pt smokes 1 ppd but says he quit on May  13th and has been tobacco free since. Congratulated the pt. Referred to 1-800 quit now for f/u and support. Discussed oral fixation substitutes, second hand smoke and in home smoking policy. Reviewed and gave pt Written education/contact information.

## 2012-04-01 NOTE — Progress Notes (Signed)
15 Days Post-Op Procedure(s) (LRB): THORACOTOMY/LOBECTOMY (Right) Subjective: No complaints. Ambulating without getting severely winded.  Objective: Vital signs in last 24 hours: Temp:  [98 F (36.7 C)-98.7 F (37.1 C)] 98 F (36.7 C) (05/28 0418) Pulse Rate:  [87-99] 99  (05/28 0418) Cardiac Rhythm:  [-] Normal sinus rhythm (05/28 0810) Resp:  [17-20] 19  (05/28 0418) BP: (100-144)/(60-89) 100/64 mmHg (05/28 0418) SpO2:  [95 %-97 %] 97 % (05/28 0418) Weight:  [131.7 kg (290 lb 5.5 oz)] 131.7 kg (290 lb 5.5 oz) (05/28 0418)  Hemodynamic parameters for last 24 hours:    Intake/Output from previous day: 05/27 0701 - 05/28 0700 In: 1090 [P.O.:1090] Out: 2051 [Urine:2050; Stool:1] Intake/Output this shift:    General appearance: alert and cooperative Heart: regular rate and rhythm, S1, S2 normal, no murmur, click, rub or gallop Lungs: slight rhonchi bilaterally Abdomen: soft, non-tender; bowel sounds normal; no masses,  no organomegaly Extremities: extremities normal, atraumatic, no cyanosis or edema Wound: incision ok  Lab Results:  Basename 04/01/12 0610 03/31/12 0355  WBC 16.6* 16.6*  HGB 9.2* 9.4*  HCT 28.4* 29.3*  PLT 429* 405*   BMET:  Basename 04/01/12 0610 03/31/12 0355  NA 134* 138  K 6.0* 5.7*  CL 94* 96  CO2 28 32  GLUCOSE 141* 110*  BUN 54* 53*  CREATININE 3.42* 3.29*  CALCIUM 9.8 10.2    PT/INR: No results found for this basename: LABPROT,INR in the last 72 hours ABG    Component Value Date/Time   PHART 7.368 03/18/2012 0421   HCO3 27.4* 03/18/2012 0421   TCO2 29 03/18/2012 0421   ACIDBASEDEF 2.0 01/05/2009 1120   O2SAT 91.0 03/18/2012 0421   CBG (last 3)   Basename 04/01/12 0611 03/31/12 2040 03/31/12 1640  GLUCAP 131* 126* 92   CXR:  No ptx.  Stable bilat airspace disease  Assessment/Plan: S/P Procedure(s) (LRB): THORACOTOMY/LOBECTOMY (Right) Postop pneumonia resolving. Off antibiotics. Acute renal failure: He is diuresing spontaneously  with I<O with -5L over the past two days although BUN, Creat, and K+ all up.  Will give him a dose of kayexalate and hydrate IV for the next 24 hrs. Diabetes:  Under good control Continue ambulation and IS He should be able to get home when renal function stable.   LOS: 15 days    Lexa Coronado K 04/01/2012

## 2012-04-02 ENCOUNTER — Inpatient Hospital Stay (HOSPITAL_COMMUNITY): Payer: Managed Care, Other (non HMO)

## 2012-04-02 LAB — URINALYSIS, ROUTINE W REFLEX MICROSCOPIC
Bilirubin Urine: NEGATIVE
Ketones, ur: NEGATIVE mg/dL
Nitrite: NEGATIVE
Protein, ur: NEGATIVE mg/dL
Urobilinogen, UA: 0.2 mg/dL (ref 0.0–1.0)
pH: 7 (ref 5.0–8.0)

## 2012-04-02 LAB — BASIC METABOLIC PANEL
BUN: 56 mg/dL — ABNORMAL HIGH (ref 6–23)
Calcium: 9.4 mg/dL (ref 8.4–10.5)
Creatinine, Ser: 3.49 mg/dL — ABNORMAL HIGH (ref 0.50–1.35)
GFR calc Af Amer: 20 mL/min — ABNORMAL LOW (ref >90)
GFR calc non Af Amer: 17 mL/min — ABNORMAL LOW (ref >90)
Glucose, Bld: 67 mg/dL — ABNORMAL LOW (ref 70–99)

## 2012-04-02 LAB — GLUCOSE, CAPILLARY
Glucose-Capillary: 116 mg/dL — ABNORMAL HIGH (ref 70–99)
Glucose-Capillary: 76 mg/dL (ref 70–99)
Glucose-Capillary: 186 mg/dL — ABNORMAL HIGH (ref 70–99)
Glucose-Capillary: 166 mg/dL — ABNORMAL HIGH (ref 70–99)

## 2012-04-02 LAB — URINE MICROSCOPIC-ADD ON

## 2012-04-02 MED ORDER — GABAPENTIN 100 MG PO CAPS
100.0000 mg | ORAL_CAPSULE | Freq: Three times a day (TID) | ORAL | Status: DC
  Administered 2012-04-02 – 2012-04-03 (×3): 100 mg via ORAL
  Filled 2012-04-02 (×5): qty 1

## 2012-04-02 NOTE — Progress Notes (Signed)
Pt ambulated 284ft, steady gait. Pt ambulated on 6L O2 with 86% sats upon return to room.  Will continue to monitor. Ninetta Lights

## 2012-04-02 NOTE — Progress Notes (Signed)
Name: [name] MRN: [id number] DOB: 1950-12-28    LOS: 16  [facility] Critical Care Note   History of Present Illness: 61 y/o M,obese, smoker (2ppd)   5/13 underwent Right thoracotomy, wedge resection of right upper lobe lung nodules with frozen section, right upper lobectomy and mediastinal lymph node dissection.      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  Antibiotics: 5/14 Vanco (bil infiltrates)>>> 5/24 5/14 Zosyn (bil infiltrates)>>> 5/24  Tests / Events: 5/18 Acute renal failure, Subcutaneous air, Rt chest tube replaced.   5/28  - off chest tube, creat worsening but makng urine and getting stronger. Pain resolved. Delirium resolved. Off chest tube 5/29 - walked 250 feet on 6L [place] -and lowest pulse ox 89%  Subjective:   5/28  - off chest tube, creat worsening but makng urine and getting stronger. Pain resolved. Delirium resolved. Off chest tube 5/29  - walked 250 feet on 6L [place] -and lowest pulse ox 89%  Vital Signs: Temp:  [97 F (36.1 C)-98.8 F (37.1 C)] 98.5 F (36.9 C) (05/29 0517) Pulse Rate:  [82-118] 118  (05/29 0517) Resp:  [19-20] 19  (05/29 0517) BP: (114-154)/(65-72) 130/71 mmHg (05/29 0517) SpO2:  [86 %-94 %] 91 % (05/29 0838) I/O last 3 completed shifts: In: 2170 [P.O.:2170] Out: 3350 [Urine:3350]  Physical Examination: General - sitting up in chair. Looks better than he does when I see him in office. Wants to go home HEENT - no sinus tenderness Cardiac - s1s2 regular Chest - prolonged exhalation,no wheeze, no chest tube buit has RLL > LLL rhonchii and possibly crackles. NOTE: R Subclavian line in place + Abd - soft, nontender Ext - no edema Neuro - normal  strength Psych - normal mood, behavior  Labs    CBC  Lab 04/01/12 0610 03/31/12 0355 03/30/12 0424  HGB 9.2* 9.4* 9.0*  HCT 28.4* 29.3* 27.4*  WBC 16.6* 16.6* 16.7*  PLT 429* 405* 382    BMET  Lab 04/02/12 0542 04/01/12 0610 03/31/12 0355 03/30/12 0424 03/29/12 0425  NA 141 134* 138 135 139  K 5.1 6.0* -- -- --  CL 98 94* 96 96 98  CO2 31 28 32 30 29  GLUCOSE 67* 141* 110* 66* 61*  BUN 56* 54* 53* 51* 50*  CREATININE 3.49* 3.42* 3.29* 3.03* 3.10*  CALCIUM 9.4 9.8 10.2 9.8 10.0  MG -- -- -- -- --  PHOS -- -- -- -- --   CBG (last 3)   Basename 04/02/12 0216 04/01/12 2257 04/01/12 2225  GLUCAP 116* 101* 74      No results found.    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  CXR 5/14 - 5/17 with Edema +/- PNA.  Subcutaneous air on CXR 05/18. Has had some post-op wheeze, most consistent with UA irritation + his COPD   - on 5/29: No resp disterss but still very hypoxemic needing 6L [place]. Off chest tube  Plan: - recheck cxr - check bnp -pulmonary hygiene -cont Spiriva + Symbicort  -Auto-set CPAP QHS (or his home device if available) - likely does not need chemo  Post-op delirium, ? Also component EtOH withdrawal Improved >reports last drink 5 months prior to admission.  Normal mental status since 5/19 and on 5/29 PLAN  - monitor   Acute renal failure.  Foley placed 5/18.  Creatinine trending down, and good urine outpt as of 5/19 but worsenned again   - 5/28 but made 2L urine - 5/29 - made 3L urine Intake/Output      05/28 0701 - 05/29 0700 05/29 0701 - 05/30 0700   P.O. 1810 360   Total Intake(mL/kg) 1810 (13.7) 360 (2.7)   Urine (mL/kg/hr) 3350 (1.1) 475 (0.8)   Stool     Total Output 3350 475   Net -1540 -115        Estimated Creatinine Clearance: 30.4 ml/min (by C-G formula based on Cr of  3.49).   Lab 04/02/12 0542 04/01/12 0610 03/31/12 0355 03/30/12 0424 03/29/12 0425  K 5.1 6.0* 5.7* 5.5* 5.5*    Lab 04/02/12 0542 04/01/12 0610 03/31/12 0355 03/30/12 0424 03/29/12 0425  CREATININE 3.49* 3.42* 3.29* 3.03* 3.10*  monitor bmet daily Appreciate renal consult 04/02/12 Hold home metformin due to continue renal failure Reduce neurontin home dose from 300mg  tid to 200mg  tid on 04/01/12 based on GFR and 100mg  tid on 5/29 DC reglan (denies nausea) - done 5/28 DC ultram (not needing it) - done 5/28     Neuropathy - on home neurontin but reduce dose 04/01/12 and 5/29 due to low renal function and gradually stop; he feels he does not need it  - percocet prn for post op pain - dc tramadol (not needing it)  DM  - ssi   - hold off metformin due to renal function  Depression - on home prozac + trazadone qhs    Dr. Kalman Shan, M.D., [facility].C.P Pulmonary and Critical Care Medicine Staff Physician [facility] Pager: [phone], If no answer or between  15:00h - 7:00h: call [phone]  04/02/2012 11:22 AM

## 2012-04-02 NOTE — Consult Note (Signed)
[facility] - CONSULT NOTE Resident Note    Please see below for attending addendum to resident note.   Date: 04/02/2012                  Patient Name:  [name]  MRN: [id number]  DOB: [birth year]  Age / Sex: 61 y.o., male         PCP: Leo Grosser, MD                 Referring Physician: Dr. Laneta Simmers                 Reason for Consult: Acute renal faillure            History of Present Illness: Patient is a 61 y.o. male with a PMHx of COPD, right lung mass, HTN, DM II, GERD, CAD s/p stent in 2006, alcohol abuse, who was admitted to [facility] on 03/17/2012 for right lobectomy.  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  Hospital was complicated by pneumonia post op as well as right anterior pneumothorax.  Right chest tube was placed on 5/18 and was removed on 5/26.  He was treated with 10 day course of Vancomycin and Zosyn.  He also had alcohol withdrawal and was placed on CIWA protocol.  On 5/9, his Cr was 0.71 but he does have a baseline Cr between 1-1.5.  On 5/17, his Cr started to trend up to 1.17-->3.16-->3.7-->4.35-->3.3-->3.03-->3.29-->3.42-->3.49.  He was given 4 doses of IV lasix 80mg  on 5/15 & 5/16 when his urine output was minimal.  Since 5/19, he has been auto-diuresing well between 3-4L daily without any Lasix.  He is net negative 18.8L since admission and his weight is down by 7 kg.  Of note, he was on Metformin, Toradol, Reglan which all have been discontinued since his Cr started to trend up.  His neurontin dosage has also been decreased from 300mg  TID to 200mg  TID.  He is also currently on Protonix for his GERD.  No urinalysis has been obtained.  CT of abdomen did not show any abnormalities in kidneys.    Medications: Outpatient medications: Prescriptions prior to admission  Medication Sig Dispense Refill  . acetaminophen (TYLENOL) 500 MG tablet Take 1,500-2,000 mg by mouth 3 (three) times daily  as needed. For pain      . albuterol (PROVENTIL) (2.5 MG/3ML) 0.083% nebulizer solution Take 2.5 mg by nebulization every 6 (six) hours as needed. For shortness of breath      . aspirin EC 81 MG tablet Take 81 mg by mouth daily.      Marland Kitchen FLUoxetine (PROZAC) 40 MG capsule Take 40 mg by mouth daily.        Marland Kitchen gabapentin (NEURONTIN) 300 MG capsule Take 300 mg by mouth 3 (three) times daily.       . insulin aspart (NOVOLOG) 100 UNIT/ML injection Inject 30 Units into the skin 3 (three) times daily before meals.       . insulin glargine (LANTUS) 100 UNIT/ML injection Inject 100 Units into the skin every morning.       Marland Kitchen losartan-hydrochlorothiazide (HYZAAR) 100-12.5 MG per tablet Take 1 tablet by mouth Daily.      . metFORMIN (GLUCOPHAGE) 500 MG tablet Take 500 mg by mouth 2 (two) times daily with a meal.      . naphazoline-pheniramine (NAPHCON-A) 0.025-0.3 % ophthalmic solution Place 1 drop into both eyes daily as needed. For  dry eyes      . omeprazole (PRILOSEC) 20 MG capsule Take 20 mg by mouth daily.        . potassium chloride (K-DUR,KLOR-CON) 10 MEQ tablet Take 4 tablets (40 mEq total) by mouth once. ONE DOSE ONLY  4 tablet  0  . tiotropium (SPIRIVA) 18 MCG inhalation capsule Place 18 mcg into inhaler and inhale daily.        . traZODone (DESYREL) 100 MG tablet Take 100 mg by mouth at bedtime.          Current medications: Current Facility-Administered Medications  Medication Dose Route Frequency Provider Last Rate Last Dose  . 0.9 %  sodium chloride infusion   Intravenous Continuous Alleen Borne, MD 75 mL/hr at 04/01/12 2150    . albuterol (PROVENTIL) (5 MG/ML) 0.5% nebulizer solution 2.5 mg  2.5 mg Nebulization Q2H PRN Coralyn Helling, MD   2.5 mg at 03/28/12 0834  . aspirin EC tablet 81 mg  81 mg Oral Daily Wilmon Pali, PA   81 mg at 04/02/12 1024  . bisacodyl (DULCOLAX) EC tablet 10 mg  10 mg Oral Daily Wilmon Pali, PA   10 mg at 04/02/12 1025  . budesonide-formoterol (SYMBICORT) 160-4.5  MCG/ACT inhaler 2 puff  2 puff Inhalation BID Leslye Peer, MD   2 puff at 04/02/12 0836  . calcium carbonate (OS-CAL - dosed in mg of elemental calcium) tablet 500 mg of elemental calcium  1 tablet Oral BID WC Alleen Borne, MD   500 mg of elemental calcium at 04/02/12 0618  . dextrose 50 % solution 25 mL  25 mL Intravenous PRN Wilmon Pali, PA   25 mL at 03/30/12 0533  . diltiazem (CARDIZEM) tablet 30 mg  30 mg Oral Q6H Alleen Borne, MD   30 mg at 04/02/12 0531  . FLUoxetine (PROZAC) capsule 40 mg  40 mg Oral Daily Alleen Borne, MD   40 mg at 04/02/12 1024  . folic acid (FOLVITE) tablet 1 mg  1 mg Oral Daily Alleen Borne, MD   1 mg at 04/02/12 1025  . gabapentin (NEURONTIN) capsule 200 mg  200 mg Oral TID Kalman Shan, MD   200 mg at 04/02/12 1025  . heparin injection 5,000 Units  5,000 Units Subcutaneous Q8H Kalman Shan, MD   5,000 Units at 04/02/12 0531  . insulin aspart (novoLOG) injection 0-24 Units  0-24 Units Subcutaneous TID WC Alleen Borne, MD   2 Units at 04/01/12 [phone]  . insulin aspart (novoLOG) injection 10 Units  10 Units Subcutaneous TID WC Alleen Borne, MD   10 Units at 04/01/12 1717  . insulin glargine (LANTUS) injection 60 Units  60 Units Subcutaneous Daily Alleen Borne, MD   60 Units at 04/02/12 1025  . mulitivitamin with minerals tablet 1 tablet  1 tablet Oral Daily Alleen Borne, MD   1 tablet at 04/02/12 1025  . naphazoline-pheniramine (NAPHCON-A) 0.025-0.3 % ophthalmic solution 1 drop  1 drop Both Eyes Daily PRN Wilmon Pali, PA      . nicotine (NICODERM CQ - dosed in mg/24 hr) patch 7 mg  7 mg Transdermal Daily Leslye Peer, MD   7 mg at 04/02/12 1026  . oxyCODONE-acetaminophen (PERCOCET) 5-325 MG per tablet 1-2 tablet  1-2 tablet Oral Q4H PRN Wilmon Pali, PA   2 tablet at 04/02/12 0743  . pantoprazole (PROTONIX) EC tablet 40 mg  40 mg Oral Q1200  Wilmon Pali, PA   40 mg at 04/01/12 1116  . senna-docusate (Senokot-S) tablet 1 tablet  1  tablet Oral QHS PRN Wilmon Pali, PA      . thiamine (VITAMIN B-1) tablet 100 mg  100 mg Oral Daily Alleen Borne, MD   100 mg at 04/02/12 1025  . tiotropium (SPIRIVA) inhalation capsule 18 mcg  18 mcg Inhalation Daily Leslye Peer, MD   18 mcg at 04/02/12 0836  . traZODone (DESYREL) tablet 100 mg  100 mg Oral QHS Wilmon Pali, PA   100 mg at 04/01/12 2150  . DISCONTD: enoxaparin (LOVENOX) injection 40 mg  40 mg Subcutaneous Q24H Alleen Borne, MD   40 mg at 04/01/12 1116  . DISCONTD: gabapentin (NEURONTIN) capsule 300 mg  300 mg Oral TID Wilmon Pali, PA   300 mg at 04/01/12 1008  . DISCONTD: metoCLOPramide (REGLAN) tablet 10 mg  10 mg Oral TID AC Alleen Borne, MD   10 mg at 04/01/12 1116  . DISCONTD: traMADol (ULTRAM) tablet 50-100 mg  50-100 mg Oral Q6H PRN Wilmon Pali, PA   100 mg at 03/26/12 1726    Allergies: No Known Allergies   Past Medical History: Past Medical History  Diagnosis Date  . Anemia   . Allergy   . Thyroid disease   . Parathyroid disease   . Headache   . Hypertension     takes  HYzaar daily  . Coronary artery disease     has 1 stent  . Myocardial infarct   . Asthma   . Emphysema     sees Dr.Ramaswami for this  . COPD (chronic obstructive pulmonary disease)     uses Albuterol and Spiriva daily  . Shortness of breath     with exertion   . Pneumonia     hx of last time in 2012  . Bronchitis   . Productive cough     white in color but no odor  . Sleep apnea     uses BiPaP  . Peripheral neuropathy   . Lung mass     right upper lobe  . Back pain     4 deteriorating disc and receives an injection q3-1mon;has been doing this for about 2yrs  . H/O hiatal hernia   . GERD (gastroesophageal reflux disease)     takes Prilosec daily  . Blood transfusion     as a child  . Diabetes mellitus     takes Metformin bid and Novolog and Lantus daily  . Glaucoma     hx of  . Depression     takes Prozac daily  . Insomnia     takes Trazodone nightly     Past Surgical History: Past Surgical History  Procedure Date  . Carpal tunnel release     bilateral  . Rotator cuff repair   . Hernia repair 15+yrs ago    double inguinal  . Ulnar nerve surgery   . Cardiac catheterization 2006/2008/2009  . Coronary angioplasty     1 stent  . Colonoscopy   . Esophagogastroduodenoscopy   . Eye surgery    Family History: Family History  Problem Relation Age of Onset  . COPD Mother   . Heart attack Father   . Anesthesia problems Neg Hx   . Hypotension Neg Hx   . Malignant hyperthermia Neg Hx   . Pseudochol deficiency Neg Hx    Social History:  reports that he has  been smoking Cigarettes.  He has a 45 pack-year smoking history. He has never used smokeless tobacco. He reports that he uses illicit drugs (Cocaine). He reports that he does not drink alcohol.  Review of Systems: As per HPI  Vital Signs: Blood pressure 130/71, pulse 118, temperature 98.5 F (36.9 C), temperature source Oral, resp. rate 19, height 5\' 11"  (1.803 m), weight 290 lb 5.5 oz (131.7 kg), SpO2 91.00%.  Weight trends: Filed Weights   03/30/12 0600 03/31/12 0500 04/01/12 0418  Weight: 296 lb 15.4 oz (134.7 kg) 294 lb 5 oz (133.5 kg) 290 lb 5.5 oz (131.7 kg)    Physical Exam: General: Vital signs reviewed and noted. Well-developed, well-nourished, in no acute distress; alert, appropriate and cooperative throughout examination.  Head: Normocephalic, atraumatic.  Eyes: PERRL, EOMI, No signs of anemia or jaundince.  Nose: Mucous membranes moist, not inflammed, nonerythematous.  Throat: Oropharynx nonerythematous, no exudate appreciated.   Neck: No deformities, masses, or tenderness noted.Supple, No carotid Bruits, no JVD.  Lungs:  Normal respiratory effort. Clear to auscultation on left lung field without crackles or wheezes.  Heart: RRR. S1 and S2 normal without gallop, murmur, or rubs.  Abdomen:  BS normoactive. Soft, slightly distended, non-tender.  Extremities: +2  pitting edema bilaterally up to knees  Neurologic: A&O X3, CN II - XII are grossly intact. Motor strength is 5/5 in the all 4 extremities, Sensations intact to light touch  Skin: No visible rashes   Lab results: Basic Metabolic Panel:  Lab 04/02/12 1610 04/01/12 0610 03/31/12 0355  NA 141 134* 138  K 5.1 6.0* 5.7*  CL 98 94* 96  CO2 31 28 32  GLUCOSE 67* 141* 110*  BUN 56* 54* 53*  CREATININE 3.49* 3.42* 3.29*  CALCIUM 9.4 9.8 10.2  MG -- -- --  PHOS -- -- --   CBC:  Lab 04/01/12 0610 03/31/12 0355 03/30/12 0424 03/29/12 0425  WBC 16.6* 16.6* 16.7* --  NEUTROABS -- -- -- --  HGB 9.2* 9.4* 9.0* --  HCT 28.4* 29.3* 27.4* --  MCV 89.0 89.3 88.7 89.7  PLT 429* 405* 382 --   CBG:  Lab 04/02/12 0216 04/01/12 2257 04/01/12 2225 04/01/12 2026 04/01/12 1635  GLUCAP 116* 101* 74 117* 95   Assessment & Plan: Pt is a 61 y.o. yo male with PMHx of COPD, right lung mass, HTN, DM II, GERD, CAD s/p stents in 2006, alcohol abuse, who was admitted to [facility] on 03/17/2012 for right lobectomy.  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  Hospital was complicated by pneumonia post op as well as right anterior pneumothorax.  Right chest tube was placed on 5/18 and was removed on 5/26.  Cr has been persistently elevated since 5/17 (baseline Cr 1-1.5).    1. Acute renal failure: likely ATN-medication induced including Toradol, reglan, metformin and hypotension as he did have a few episodes of soft blood pressure, complicated by his post op pneumonia and pneumothorax.   He also received ~10 days of Vancomycin but this has been d/c since 5/24.  Vancomycin trough level on 5/17 was 11.9.  He appears volume overloaded but has been autodiuresing well without any Lasix since 5/19 with net negative of 18.8L since admission and that he is able to get po fluid intake well; therefore, would not recommend giving extra IVF at this time.  CT abdomen/pelvis on  11/13/2011 did not show any abnormalities with kidneys.  We are hopeful that  his kidney function will improve with time.    -Continue to hold off nephrotoxic agents--may need to decrease Neurontin further maybe once daily instead of TID. -Will check urinalysis/microscopy today -Continue to trend BMP daily -Wife was updated at bedside and all questions were answered.  -We will continue to follow with you  2. Right Lobectomy: stable, manage per CTVS  3. COPD: PCCM is on board.  Continue current regimen  4. DM II: Currently on Lantus 60 units qd, meal coverage 10units TID, and SSI.  Blood sugar well controlled in past 24 hrs. Last HbA1c in 11/2010 was 9.8.  Would check another HbA1c.    DVT PPX - heparin SQ 5000units TID  Patient history and plan of care reviewed with attending, Dr. Hyman Hopes. Mathis Dad, MD  PGYII, Internal Medicine Resident 04/02/2012, 11:18 AM

## 2012-04-02 NOTE — Progress Notes (Addendum)
16 Days Post-Op Procedure(s) (LRB): THORACOTOMY/LOBECTOMY (Right)  Subjective: [name] has no new complaints this morning.  He asked if he was going to be discharged today.  I explained to him that he would not be due to his renal problems.   Objective: Vital signs in last 24 hours: Temp:  [97 F (36.1 C)-98.8 F (37.1 C)] 98.5 F (36.9 C) (05/29 0517) Pulse Rate:  [82-118] 118  (05/29 0517) Cardiac Rhythm:  [-] Normal sinus rhythm;Other (Comment) (05/28 1945) Resp:  [19-20] 19  (05/29 0517) BP: (114-154)/(65-72) 130/71 mmHg (05/29 0517) SpO2:  [86 %-99 %] 89 % (05/29 0517)  Intake/Output from previous day: 05/28 0701 - 05/29 0700 In: 1810 [P.O.:1810] Out: 3350 [Urine:3350] Intake/Output this shift: Total I/O In: 360 [P.O.:360] Out: 475 [Urine:475]  General appearance: alert, cooperative, no distress and morbidly obese Heart: regular rate and rhythm Lungs: wheezes bilaterally Abdomen: soft, non-tender; bowel sounds normal; no masses,  no organomegaly Extremities: edema 2-3+ Wound: clean and dry  Lab Results:  [facility] 04/01/12 0610 03/31/12 0355  WBC 16.6* 16.6*  HGB 9.2* 9.4*  HCT 28.4* 29.3*  PLT 429* 405*   BMET:  Basename 04/02/12 0542 04/01/12 0610  NA 141 134*  K 5.1 6.0*  CL 98 94*  CO2 31 28  GLUCOSE 67* 141*  BUN 56* 54*  CREATININE 3.49* 3.42*  CALCIUM 9.4 9.8    PT/INR: No results found for this basename: LABPROT,INR in the last 72 hours ABG    Component Value Date/Time   PHART 7.368 03/18/2012 0421   HCO3 27.4* 03/18/2012 0421   TCO2 29 03/18/2012 0421   ACIDBASEDEF 2.0 01/05/2009 1120   O2SAT 91.0 03/18/2012 0421   CBG (last 3)   Basename 04/02/12 0216 04/01/12 2257 04/01/12 2225  GLUCAP 116* 101* 74    Assessment/Plan: S/P Procedure(s) (LRB): THORACOTOMY/LOBECTOMY (Right)  2. Post Op Pneumonia- resolved 3. Severe COPD- wheezing throughout, on bipap nightly, spiriva and symbicort 4. Acute Renal Failure- good u/o 3L yesterday,  creatinine remains elevated,  5. Hyperkalemia- improving, down to 5.1, given Kayexalate yesterday and currently on IV Fluids, can likely d/c today 6. Dispo- patient with continued renal issues, CCM on board, will be appropriate for d/c once renal function begins to improve    LOS: 16 days    Raford Pitcher, ERIN 04/02/2012    Chart reviewed, patient examined, agree with above. Nephrology consulted. He will be able to go home once renal function stabilizes.

## 2012-04-02 NOTE — Progress Notes (Signed)
Pt ambulated 262ft on 6L O2, steady gait.  Upon return to room sats 85% on 2L O2 [place].  Will continue to monitor. Ninetta Lights

## 2012-04-02 NOTE — Consult Note (Signed)
I have seen and examined this patient and agree with the plan of care . Prolonged course of ATN .This could be due to toradol use of episodes of blood pressure variability. I doubt Acute interstitial nephritis or glomerulonephritis. I will check a urinalysis and follow. He does not appear dehydrated despite diuresis and down 17 lbs in weight  Noell Shular W 04/02/2012, 12:03 PM

## 2012-04-03 LAB — BASIC METABOLIC PANEL
Calcium: 9.4 mg/dL (ref 8.4–10.5)
GFR calc Af Amer: 23 mL/min — ABNORMAL LOW (ref >90)
GFR calc non Af Amer: 20 mL/min — ABNORMAL LOW (ref >90)
Glucose, Bld: 112 mg/dL — ABNORMAL HIGH (ref 70–99)
Potassium: 4.9 mEq/L (ref 3.5–5.1)
Sodium: 136 mEq/L (ref 135–145)

## 2012-04-03 LAB — PRO B NATRIURETIC PEPTIDE: Pro B Natriuretic peptide (BNP): 368.7 pg/mL — ABNORMAL HIGH (ref 0–125)

## 2012-04-03 LAB — HEMOGLOBIN A1C
Hgb A1c MFr Bld: 7.7 % — ABNORMAL HIGH (ref <5.7)
Mean Plasma Glucose: 174 mg/dL — ABNORMAL HIGH (ref <117)

## 2012-04-03 LAB — GLUCOSE, CAPILLARY
Glucose-Capillary: 128 mg/dL — ABNORMAL HIGH (ref 70–99)
Glucose-Capillary: 133 mg/dL — ABNORMAL HIGH (ref 70–99)

## 2012-04-03 NOTE — Progress Notes (Signed)
This unit is a home unit. PT tolerating well. RT will continue to monitor.

## 2012-04-03 NOTE — Progress Notes (Addendum)
[facility] - PROGRESS NOTE Resident Note   Please see below for attending addendum to resident note.  Subjective:   He reports doing well asking when he can go home.  Urine output 1.7L in past 24 hours without Lasix.  Anxious to go home. Urinalysis showed trace leukocytes otherwise, normal, no cast noted. Cr 3.49-->3.17.  K 4.9.  Weight fluctuates.  Objective:    Vital Signs:   Temp:  [97.4 F (36.3 C)-98.9 F (37.2 C)] 98 F (36.7 C) (05/30 0456) Pulse Rate:  [83-90] 83  (05/30 0456) Resp:  [16-20] 16  (05/30 0456) BP: (115-135)/(67-74) 118/67 mmHg (05/30 0456) SpO2:  [93 %-96 %] 96 % (05/30 0456) Weight:  [293 lb 14.4 oz (133.312 kg)] 293 lb 14.4 oz (133.312 kg) (05/30 0456) Last BM Date: 04/02/12  24-hour weight change: Weight change:   Weight trends: Filed Weights   03/31/12 0500 04/01/12 0418 04/03/12 0456  Weight: 294 lb 5 oz (133.5 kg) 290 lb 5.5 oz (131.7 kg) 293 lb 14.4 oz (133.312 kg)   Intake/Output:  05/29 0701 - 05/30 0700 In: 2160 [P.O.:2160] Out: 1700 [Urine:1700]  General:  Vital signs reviewed and noted. Well-developed, well-nourished, in no acute distress; alert, appropriate and cooperative throughout examination.  Neck: No deformities, masses, or tenderness noted.Supple, No carotid Bruits, no JVD.  Lungs: Normal respiratory effort. Clear to auscultation on left lung field without crackles or wheezes.  Heart: RRR. S1 and S2 normal without gallop, murmur, or rubs.  Abdomen: BS normoactive. Soft, slightly distended, non-tender.  Extremities: +2 pitting edema bilaterally up to knees-slight improvement  Neurologic: nonfocal   Labs: Basic Metabolic Panel:  Lab 04/03/12 0981 04/02/12 0542 04/01/12 0610 03/31/12 0355 03/30/12 0424  NA 136 141 134* 138 135  K 4.9 5.1 6.0* 5.7* 5.5*  CL 93* 98 94* 96 96  CO2 29 31 28  32 30  GLUCOSE 112* 67* 141* 110* 66*  BUN 54* 56* 54* 53* 51*  CREATININE 3.17* 3.49* 3.42* 3.29* 3.03*  CALCIUM 9.4  9.4 9.8 -- --  MG -- -- -- -- --  PHOS -- -- -- -- --   CBC:  Lab 04/01/12 0610 03/31/12 0355 03/30/12 0424 03/29/12 0425  WBC 16.6* 16.6* 16.7* 16.0*  NEUTROABS -- -- -- --  HGB 9.2* 9.4* 9.0* 9.7*  HCT 28.4* 29.3* 27.4* 29.5*  MCV 89.0 89.3 88.7 89.7  PLT 429* 405* 382 381   CBG:  Lab 04/03/12 0609 04/02/12 2123 04/02/12 1624 04/02/12 1150 04/02/12 0638  GLUCAP 128* 166* 186* 102* 76   Urinalysis:  Basename 04/02/12 1210  COLORURINE YELLOW  LABSPEC 1.010  PHURINE 7.0  GLUCOSEU NEGATIVE  HGBUR NEGATIVE  BILIRUBINUR NEGATIVE  KETONESUR NEGATIVE  PROTEINUR NEGATIVE  UROBILINOGEN 0.2  NITRITE NEGATIVE  LEUKOCYTESUR TRACE*    Imaging: Dg Chest Port 1 View  04/02/2012  *RADIOLOGY REPORT*  Clinical Data: Chest pain, shortness of breath  PORTABLE CHEST - 1 VIEW  Comparison: Mar 31, 2012  Findings: Right subclavian central line tip is stable at the SVC level.  No evidence of pneumothorax.  A small right pleural effusion persists.  Subcutaneous emphysema on the right has lessened.  Cardiomegaly and pulmonary edema persist.  IMPRESSION: No pneumothorax.  No significant change in appearance of pulmonary edema and right pleural effusion.  Original Report Authenticated By: Brandon Melnick, M.D.      Medications:    Infusions:    Scheduled Medications:    . aspirin EC  81 mg Oral Daily  .  bisacodyl  10 mg Oral Daily  . budesonide-formoterol  2 puff Inhalation BID  . calcium carbonate  1 tablet Oral BID WC  . diltiazem  30 mg Oral Q6H  . FLUoxetine  40 mg Oral Daily  . folic acid  1 mg Oral Daily  . gabapentin  100 mg Oral TID  . heparin subcutaneous  5,000 Units Subcutaneous Q8H  . insulin aspart  0-24 Units Subcutaneous TID WC  . insulin aspart  10 Units Subcutaneous TID WC  . insulin glargine  60 Units Subcutaneous Daily  . mulitivitamin with minerals  1 tablet Oral Daily  . nicotine  7 mg Transdermal Daily  . pantoprazole  40 mg Oral Q1200  . thiamine  100 mg Oral  Daily  . tiotropium  18 mcg Inhalation Daily  . traZODone  100 mg Oral QHS  . DISCONTD: gabapentin  200 mg Oral TID    PRN Medications: albuterol, dextrose, naphazoline-pheniramine, oxyCODONE-acetaminophen, senna-docusate   Assessment/ Plan:    Pt is a 61 y.o. yo male with PMHx of COPD, right lung mass, HTN, DM II, GERD, CAD s/p stents in 2006, alcohol abuse, who was admitted to [facility] on 03/17/2012 for right lobectomy. 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 Hospital was complicated by pneumonia post op as well as right anterior pneumothorax. Right chest tube was placed on 5/18 and was removed on 5/26. Cr has been persistently elevated since 5/17 (baseline Cr 1-1.5).   1. Acute renal failure: likely ATN-medication induced including Toradol, reglan, metformin and hypotension as he did have a few episodes of soft blood pressure, complicated by his post op pneumonia and pneumothorax. He also received ~10 days of Vancomycin but this has been d/c since 5/24. Vancomycin trough level on 5/17 was 11.9. He appears volume overloaded but has been autodiuresing well without any Lasix since 5/19 with net negative of 18.8L since admission and that he is able to get po fluid intake well; therefore, would not recommend giving extra IVF at this time. CT abdomen/pelvis on 11/13/2011 did not show any abnormalities with kidneys. We are hopeful that his kidney function will improve with time.  -Cr 3.4-->3.17, improving -Continue to hold off nephrotoxic agents--may need to decrease Neurontin further maybe once daily instead of TID.  -Urinalysis did not show any casts or hematuria  -Continue to trend BMP daily and if his Cr continues to trend down, may be able to discharge in the next few days.  He will need repeat BMP at [facility] once he is discharge and then schedule an appointment with Dr. Hyman Hopes in 3-4 weeks.     2. Right Lobectomy: stable, manage per CTVS     3. COPD: PCCM is on board. Continue current regimen   4. DM II: Currently on Lantus 60 units qd, meal coverage 10units TID, and SSI. Blood sugar well controlled in past 24 hrs. Last HbA1c in 11/2010 was 9.8.  -check HbA1c.  DVT PPX - heparin SQ 5000units TID   Length of Stay: 17 days  Patient history and plan of care reviewed with attending, Dr. Hyman Hopes.  Mathis Dad, MD  PGYII, Internal Medicine Resident 04/03/2012, 9:13 AM

## 2012-04-03 NOTE — Progress Notes (Signed)
I have seen and examined this patient and agree with the plan of care . Creatinine starting to improve.  [name] W 04/03/2012, 7:37 PM

## 2012-04-03 NOTE — Progress Notes (Signed)
Subclavian removed per protocol, Pt tolerated very well.  VSS 120/60 manual BP post removal and 5 mins firm pressure held.  Occlusive dressing secured.  Pt understands bedrest with BRP for one hour.

## 2012-04-03 NOTE — Progress Notes (Addendum)
17 Days Post-Op Procedure(s) (LRB): THORACOTOMY/LOBECTOMY (Right)  Subjective: [name] has no new complaints this morning.  He continues to have LE edema.  Objective: Vital signs in last 24 hours: Temp:  [97.4 F (36.3 C)-98.9 F (37.2 C)] 98 F (36.7 C) (05/30 0456) Pulse Rate:  [83-90] 83  (05/30 0456) Cardiac Rhythm:  [-] Sinus tachycardia;Other (Comment) (05/29 2020) Resp:  [16-20] 16  (05/30 0456) BP: (115-135)/(67-74) 118/67 mmHg (05/30 0456) SpO2:  [91 %-96 %] 96 % (05/30 0456) Weight:  [293 lb 14.4 oz (133.312 kg)] 293 lb 14.4 oz (133.312 kg) (05/30 0456) Intake/Output from previous day: 05/29 0701 - 05/30 0700 In: 1800 [P.O.:1800] Out: 1700 [Urine:1700]  General appearance: alert, cooperative and no distress Heart: regular rate and rhythm Lungs: wheezes bilaterally Abdomen: soft, non-tender; bowel sounds normal; no masses,  no organomegaly Extremities: edema 2-3+ Wound: clean and dry  Lab Results:  [facility] 04/01/12 0610  WBC 16.6*  HGB 9.2*  HCT 28.4*  PLT 429*   BMET:  Basename 04/03/12 0550 04/02/12 0542  NA 136 141  K 4.9 5.1  CL 93* 98  CO2 29 31  GLUCOSE 112* 67*  BUN 54* 56*  CREATININE 3.17* 3.49*  CALCIUM 9.4 9.4    PT/INR: No results found for this basename: LABPROT,INR in the last 72 hours ABG    Component Value Date/Time   PHART 7.368 03/18/2012 0421   HCO3 27.4* 03/18/2012 0421   TCO2 29 03/18/2012 0421   ACIDBASEDEF 2.0 01/05/2009 1120   O2SAT 91.0 03/18/2012 0421   CBG (last 3)   Basename 04/03/12 0609 04/02/12 2123 04/02/12 1624  GLUCAP 128* 166* 186*    Assessment/Plan: S/P Procedure(s) (LRB): THORACOTOMY/LOBECTOMY (Right)  2. Post op pneumonia-resolving 3. Severe COPD-wheezing throughout, on bipap nightly, spiriva and symbicort 4. Acute Renal Failure- creatinine improved today to 3.17, nephrology involved, appreciate recommendations 5. Hyperkalemia- improved, will continue to follow 6. Dispo- patient stable from lung  surgery standpoint, hopefully renal function continues to improve, will aim for d/c when nephrology feels appropriate from renal standpoint  LOS: 17 days    Lowella Dandy 04/03/2012   Chart reviewed, patient examined, agree with above.

## 2012-04-03 NOTE — Progress Notes (Signed)
Name: [name] MRN: [id number] DOB: [birth year]    LOS: 17  [facility] Note   History of Present Illness: 61 y/o M,obese, smoker (2ppd)   5/13 underwent Right thoracotomy, wedge resection of right upper lobe lung nodules with frozen section, right upper lobectomy and mediastinal lymph node dissection.      Lines / Drains: R Chest tube 5/13 >> 5/17 R Chest tube 5/18>>5/28  Cultures / Pathology: 5/13 lung resection bx>>>Lung, wedge biopsy/resection, right upper lobe>>SQUAMOUS CELL CARCINOMA-  Lung, resection (segmental or lobe), Right upper>>CARCINOID TUMORLET- FOUR LYMPH NODES, NEGATIVE FOR CARCINOMA (0/4).  Antibiotics: 5/14 Vanco (bil infiltrates)>>> 5/24 5/14 Zosyn (bil infiltrates)>>> 5/24  Tests / Events: 5/18 Acute renal failure, Subcutaneous air, Rt chest tube replaced.   5/28  - off chest tube, creat worsening but makng urine and getting stronger. Pain resolved. Delirium resolved. Off chest tube 5/29 - walked 250 feet on 6L [place] -and lowest pulse ox 89% 5/30 - on 5L O2, cr trending down  Subjective:  Patient denies complaints, hopeful to go home.    Vital Signs: Temp:  [97.4 F (36.3 C)-98.9 F (37.2 C)] 98 F (36.7 C) (05/30 0456) Pulse Rate:  [83-90] 83  (05/30 0456) Resp:  [16-20] 16  (05/30 0456) BP: (115-135)/(67-74) 118/67 mmHg (05/30 0456) SpO2:  [93 %-97 %] 97 % (05/30 0923) Weight:  [293 lb 14.4 oz (133.312 kg)] 293 lb 14.4 oz (133.312 kg) (05/30 0456) I/O last 3 completed shifts: In: 3610 [P.O.:3610] Out: 3450 [Urine:3450]  Physical Examination: General - Obese male in NAD HEENT - no sinus tenderness Cardiac - s1s2 regular Chest - prolonged exhalation, no wheeze Abd - soft, nontender Ext - no edema Neuro - normal strength Psych - normal mood, behavior  Labs    CBC  Lab 04/01/12 0610 03/31/12 0355 03/30/12 0424  HGB 9.2* 9.4* 9.0*  HCT 28.4* 29.3* 27.4*  WBC 16.6* 16.6* 16.7*  PLT 429* 405* 382     BMET  Lab 04/03/12 0550 04/02/12 0542 04/01/12 0610 03/31/12 0355 03/30/12 0424  NA 136 141 134* 138 135  K 4.9 5.1 -- -- --  CL 93* 98 94* 96 96  CO2 29 31 28  32 30  GLUCOSE 112* 67* 141* 110* 66*  BUN 54* 56* 54* 53* 51*  CREATININE 3.17* 3.49* 3.42* 3.29* 3.03*  CALCIUM 9.4 9.4 9.8 10.2 9.8  MG -- -- -- -- --  PHOS -- -- -- -- --   CBG (last 3)   Basename 04/03/12 1105 04/03/12 0609 04/02/12 2123  GLUCAP 133* 128* 166*    Dg Chest Port 1 View  04/02/2012  *RADIOLOGY REPORT*  Clinical Data: Chest pain, shortness of breath  PORTABLE CHEST - 1 VIEW  Comparison: Mar 31, 2012  Findings: Right subclavian central line tip is stable at the SVC level.  No evidence of pneumothorax.  A small right pleural effusion persists.  Subcutaneous emphysema on the right has lessened.  Cardiomegaly and pulmonary edema persist.  IMPRESSION: No pneumothorax.  No significant change in appearance of pulmonary edema and right pleural effusion.  Original Report Authenticated By: Brandon Melnick, M.D.    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  CXR 5/14 - 5/17 with Edema +/- PNA.  Subcutaneous air on CXR 05/18. Has had some post-op wheeze, most consistent with UA irritation +  his COPD  Plan: - pulmonary hygiene - cont Spiriva + Symbicort  - Auto-set CPAP QHS (or his home device if available) -O2 for home 5L continuous - DOES NOT NEED ONCOLOGY APPT OR FOLLOWUP FOR STAGE 1A NSCLC - DR Nasif Bos WILL DO FOLLOWUP - Follow up appt scheduled for outpatient    Post-op delirium, ? Also component EtOH withdrawal Improved >reports last drink 5 months prior to admission.  Normal mental status since 5/19 and on 5/29  Plan: - monitor   Acute renal failure.  Foley placed 5/18.  Creatinine with mild trending down  Intake/Output      05/29  0701 - 05/30 0700 05/30 0701 - 05/31 0700   P.O. 2160    Total Intake(mL/kg) 2160 (16.2)    Urine (mL/kg/hr) 1700 (0.5)    Total Output 1700    Net +460         Urine Occurrence 2 x    Estimated Creatinine Clearance: 33.7 ml/min (by C-G formula based on Cr of 3.17).   Lab 04/03/12 0550 04/02/12 0542 04/01/12 0610 03/31/12 0355 03/30/12 0424  K 4.9 5.1 6.0* 5.7* 5.5*    Lab 04/03/12 0550 04/02/12 0542 04/01/12 0610 03/31/12 0355 03/30/12 0424  CREATININE 3.17* 3.49* 3.42* 3.29* 3.03*   Plan: -monitor bmet daily -Appreciate renal consult 04/02/12 -Hold home metformin due to continue renal failure -Reduce neurontin home dose from 300mg  tid to 200mg  tid on 04/01/12 based on -GFR and 100mg  tid on 5/29   Neuropathy Plan: -on home neurontin but reduce dose 04/01/12 and 5/29 due to low renal function and gradually stop ON 5/30; he feels he does not need it - percocet prn for post op pain   DM - ssi  - hold off metformin due to renal function   Depression - on home prozac + trazadone qhs    Follow up made for 6/19 with Dr. Marchelle Gearing.  PCCM will sign off.  Please call for needs.   Canary Brim, NP-C [facility] Pgr: [phone] or [phone]   STAFF NOTE: I, Dr Lavinia Sharps have personally reviewed patient's available data, including medical history, events of note, physical examination and test results as part of my evaluation. I have discussed with resident/NP and other care providers such as pharmacist, RN and RRT.  In addition,  I personally evaluated patient and elicited key findings of STAGE 1 A NSCLC, COPD, RENAL FAILURE -  He is still hypoxemic but this is better. REnal failure better; no plans to diurese. PCCM will sign of. Will see him in office. No need for oncoogy foloowup for stage 1A NSCLC. I will do surveillance  Rest per NP/medical resident whose note is outlined above and that I agree with  Dr. Kalman Shan, M.D., [facility].C.P Pulmonary and  Critical Care Medicine Staff Physician [facility] Pager: [phone], If no answer or between  15:00h - 7:00h: call [phone]  04/03/2012 4:34 PM

## 2012-04-04 DIAGNOSIS — Z9889 Other specified postprocedural states: Secondary | ICD-10-CM

## 2012-04-04 DIAGNOSIS — Z902 Acquired absence of lung [part of]: Secondary | ICD-10-CM

## 2012-04-04 LAB — GLUCOSE, CAPILLARY: Glucose-Capillary: 96 mg/dL (ref 70–99)

## 2012-04-04 LAB — BASIC METABOLIC PANEL
BUN: 53 mg/dL — ABNORMAL HIGH (ref 6–23)
CO2: 31 mEq/L (ref 19–32)
Chloride: 97 mEq/L (ref 96–112)
Creatinine, Ser: 3.07 mg/dL — ABNORMAL HIGH (ref 0.50–1.35)
Glucose, Bld: 115 mg/dL — ABNORMAL HIGH (ref 70–99)
Potassium: 5.1 mEq/L (ref 3.5–5.1)

## 2012-04-04 LAB — CBC
HCT: 26.7 % — ABNORMAL LOW (ref 39.0–52.0)
Hemoglobin: 8.6 g/dL — ABNORMAL LOW (ref 13.0–17.0)
MCHC: 32.2 g/dL (ref 30.0–36.0)
MCV: 88.7 fL (ref 78.0–100.0)
RDW: 13.9 % (ref 11.5–15.5)

## 2012-04-04 MED ORDER — DILTIAZEM HCL 30 MG PO TABS: 30.0000 mg | ORAL_TABLET | Freq: Four times a day (QID) | ORAL | Status: DC

## 2012-04-04 MED ORDER — FOLIC ACID 1 MG PO TABS: 1.0000 mg | ORAL_TABLET | Freq: Every day | ORAL | Status: DC

## 2012-04-04 MED ORDER — OXYCODONE-ACETAMINOPHEN 5-325 MG PO TABS: 1.0000 | ORAL_TABLET | ORAL | Status: AC | PRN

## 2012-04-04 MED ORDER — BUDESONIDE-FORMOTEROL FUMARATE 160-4.5 MCG/ACT IN AERO: 2.0000 | INHALATION_SPRAY | Freq: Two times a day (BID) | RESPIRATORY_TRACT | Status: DC

## 2012-04-04 MED ORDER — THIAMINE HCL 100 MG PO TABS: 100.0000 mg | ORAL_TABLET | Freq: Every day | ORAL | Status: DC

## 2012-04-04 MED ORDER — ADULT MULTIVITAMIN W/MINERALS CH: 1.0000 | ORAL_TABLET | Freq: Every day | ORAL | Status: DC

## 2012-04-04 NOTE — Progress Notes (Signed)
[facility] ROUNDING NOTE   Subjective:   Interval History: none. Wants to go home  Objective:  Vital signs in last 24 hours:  Temp:  [97.7 F (36.5 C)-98.4 F (36.9 C)] 98.4 F (36.9 C) (05/31 0300) Pulse Rate:  [77-85] 77  (05/31 0300) Resp:  [18-20] 18  (05/31 0300) BP: (108-120)/(60-72) 119/62 mmHg (05/31 0300) SpO2:  [95 %-97 %] 97 % (05/31 0300) Weight:  [130.273 kg (287 lb 3.2 oz)] 130.273 kg (287 lb 3.2 oz) (05/31 0300)  Weight change: -3.039 kg (-6 lb 11.2 oz) Filed Weights   04/01/12 0418 04/03/12 0456 04/04/12 0300  Weight: 131.7 kg (290 lb 5.5 oz) 133.312 kg (293 lb 14.4 oz) 130.273 kg (287 lb 3.2 oz)    Intake/Output: I/O last 3 completed shifts: In: 2760 [P.O.:2760] Out: 2101 [Urine:2100; Stool:1]   Intake/Output this shift:     CVS- RRR RS- CTA ABD- BS present soft non-distended EXT- no edema   Basic Metabolic Panel:  Lab 04/04/12 4782 04/03/12 0550 04/02/12 0542 04/01/12 0610 03/31/12 0355  NA 139 136 141 134* 138  K 5.1 4.9 5.1 6.0* 5.7*  CL 97 93* 98 94* 96  CO2 31 29 31 28  32  GLUCOSE 115* 112* 67* 141* 110*  BUN 53* 54* 56* 54* 53*  CREATININE 3.07* 3.17* 3.49* 3.42* 3.29*  CALCIUM 9.7 9.4 9.4 -- --  MG -- -- -- -- --  PHOS -- -- -- -- --    Liver Function Tests: No results found for this basename: AST:5,ALT:5,ALKPHOS:5,BILITOT:5,PROT:5,ALBUMIN:5 in the last 168 hours No results found for this basename: LIPASE:5,AMYLASE:5 in the last 168 hours No results found for this basename: AMMONIA:3 in the last 168 hours  CBC:  Lab 04/04/12 0525 04/01/12 0610 03/31/12 0355 03/30/12 0424 03/29/12 0425  WBC 13.4* 16.6* 16.6* 16.7* 16.0*  NEUTROABS -- -- -- -- --  HGB 8.6* 9.2* 9.4* 9.0* 9.7*  HCT 26.7* 28.4* 29.3* 27.4* 29.5*  MCV 88.7 89.0 89.3 88.7 89.7  PLT 406* 429* 405* 382 381    Cardiac Enzymes: No results found for this basename: CKTOTAL:5,CKMB:5,CKMBINDEX:5,TROPONINI:5 in the last 168 hours  BNP: No components  found with this basename: POCBNP:5  CBG:  Lab 04/03/12 2014 04/03/12 1635 04/03/12 1105 04/03/12 0609 04/02/12 2123  GLUCAP 156* 129* 133* 128* 166*    Microbiology: Results for orders placed during the hospital encounter of 03/13/12  SURGICAL PCR SCREEN     Status: Normal   Collection Time   03/13/12 11:35 AM      Component Value Range Status Comment   MRSA, PCR NEGATIVE  NEGATIVE  Final    Staphylococcus aureus NEGATIVE  NEGATIVE  Final     Coagulation Studies: No results found for this basename: LABPROT:5,INR:5 in the last 72 hours  Urinalysis:  Basename 04/02/12 1210  COLORURINE YELLOW  LABSPEC 1.010  PHURINE 7.0  GLUCOSEU NEGATIVE  HGBUR NEGATIVE  BILIRUBINUR NEGATIVE  KETONESUR NEGATIVE  PROTEINUR NEGATIVE  UROBILINOGEN 0.2  NITRITE NEGATIVE  LEUKOCYTESUR TRACE*      Imaging: Dg Chest Port 1 View  04/02/2012  *RADIOLOGY REPORT*  Clinical Data: Chest pain, shortness of breath  PORTABLE CHEST - 1 VIEW  Comparison: Mar 31, 2012  Findings: Right subclavian central line tip is stable at the SVC level.  No evidence of pneumothorax.  A small right pleural effusion persists.  Subcutaneous emphysema on the right has lessened.  Cardiomegaly and pulmonary edema persist.  IMPRESSION: No pneumothorax.  No significant change in appearance  of pulmonary edema and right pleural effusion.  Original Report Authenticated By: Brandon Melnick, M.D.     Medications:        . aspirin EC  81 mg Oral Daily  . bisacodyl  10 mg Oral Daily  . budesonide-formoterol  2 puff Inhalation BID  . calcium carbonate  1 tablet Oral BID WC  . diltiazem  30 mg Oral Q6H  . FLUoxetine  40 mg Oral Daily  . folic acid  1 mg Oral Daily  . heparin subcutaneous  5,000 Units Subcutaneous Q8H  . insulin aspart  0-24 Units Subcutaneous TID WC  . insulin aspart  10 Units Subcutaneous TID WC  . insulin glargine  60 Units Subcutaneous Daily  . mulitivitamin with minerals  1 tablet Oral Daily  . nicotine  7  mg Transdermal Daily  . pantoprazole  40 mg Oral Q1200  . thiamine  100 mg Oral Daily  . tiotropium  18 mcg Inhalation Daily  . traZODone  100 mg Oral QHS  . DISCONTD: gabapentin  100 mg Oral TID   albuterol, dextrose, naphazoline-pheniramine, oxyCODONE-acetaminophen, senna-docusate  Assessment/ Plan:  Pt is a 61 y.o. yo male with PMHx of COPD, right lung mass, HTN, DM II, GERD, CAD s/p stents in 2006, alcohol abuse, who was admitted to [facility] on 03/17/2012 for right lobectomy. 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 Hospital was complicated by pneumonia post op as well as right anterior pneumothorax. Right chest tube was placed on 5/18 and was removed on 5/26. Cr has been persistently elevated since 5/17 (baseline Cr 1-1.5).   1. Acute renal failure: likely ATN-medication induced including Toradol, reglan, metformin and hypotension as he did have a few episodes of soft blood pressure, complicated by his post op pneumonia and pneumothorax. He also received ~10 days of Vancomycin but this has been d/c since 5/24. Vancomycin trough level on 5/17 was 11.9.   2. Right Lobectomy: stable, manage per CTVS  3. COPD: PCCM is on board. Continue current regimen  4. DM II: Currently on Lantus 60 units qd, meal coverage 10units TID, and SSI. Blood sugar well controlled in past 24 hrs. Last HbA1c in 11/2010 was 9.8.  -check HbA1c.  DVT PPX - heparin SQ 5000units TID   Appears to be doing better creatinine is going to slowly trend down over the following weeks. Advised to avoid all nephrotoxins  ACE inhibitors and ARB. I will obtain blood work Monday as an outpatient but feel he is stable to be discharged.   LOS: 18 [place] W @TODAY @7 :59 AM

## 2012-04-04 NOTE — Progress Notes (Signed)
Pt/family given discharge instructions, medication lists, follow up appointments, s/sx infection and when to call the doctor.  Pt and wife verbalized understanding of instructions. [name]

## 2012-04-04 NOTE — Progress Notes (Addendum)
18 Days Post-Op Procedure(s) (LRB): THORACOTOMY/LOBECTOMY (Right)  Subjective: [name] has no complaints this morning.  [name] is still anxious to go home.  Objective: Vital signs in last 24 hours: Temp:  [97.7 F (36.5 C)-98.4 F (36.9 C)] 98.4 F (36.9 C) (05/31 0300) Pulse Rate:  [77-85] 77  (05/31 0300) Cardiac Rhythm:  [-] Normal sinus rhythm;Other (Comment) (05/30 2040) Resp:  [18-20] 18  (05/31 0300) BP: (108-120)/(60-72) 119/62 mmHg (05/31 0300) SpO2:  [95 %-97 %] 97 % (05/31 0300) Weight:  [287 lb 3.2 oz (130.273 kg)] 287 lb 3.2 oz (130.273 kg) (05/31 0300) Intake/Output from previous day: 05/30 0701 - 05/31 0700 In: 1680 [P.O.:1680] Out: 1301 [Urine:1300; Stool:1]  General appearance: alert, cooperative and no distress Heart: regular rate and rhythm Lungs: clear to auscultation bilaterally Abdomen: soft, non-tender; bowel sounds normal; no masses,  no organomegaly Extremities: edema 2-3+ Wound: surgical incisions clean and dry, 2 small areas of erythema on right side of chest with minimal drainage  Lab Results:  [facility] 04/04/12 0525  WBC 13.4*  HGB 8.6*  HCT 26.7*  PLT 406*   BMET:  Basename 04/04/12 0525 04/03/12 0550  NA 139 136  K 5.1 4.9  CL 97 93*  CO2 31 29  GLUCOSE 115* 112*  BUN 53* 54*  CREATININE 3.07* 3.17*  CALCIUM 9.7 9.4    PT/INR: No results found for this basename: LABPROT,INR in the last 72 hours ABG    Component Value Date/Time   PHART 7.368 03/18/2012 0421   HCO3 27.4* 03/18/2012 0421   TCO2 29 03/18/2012 0421   ACIDBASEDEF 2.0 01/05/2009 1120   O2SAT 91.0 03/18/2012 0421   CBG (last 3)   Basename 04/03/12 2014 04/03/12 1635 04/03/12 1105  GLUCAP 156* 129* 133*    Assessment/Plan: S/P Procedure(s) (LRB): THORACOTOMY/LOBECTOMY (Right)  2. Severe COPD- wheezing throughout, on bipap nightly, continue spiriva and symbicort 3. Acute Renal Failure-creatinine continues to improve, down to 3.07, appreciate nephrology  recommendations 4. Hyperkalemia- 5.1, will monitor 5. Dispo- [name] stable, renal function improving, if creatinine decreases again tomorrow if okay with renal [name] could potentially be discharged home   LOS: 18 days    BARRETT, ERIN 04/04/2012   Chart reviewed, [name] examined, agree with above.  [name] looks good and creat is down slightly.  Will send him home this evening.

## 2012-04-04 NOTE — Discharge Instructions (Signed)
Thoracotomy Care After Refer to this sheet in the next few weeks. These instructions provide you with information on caring for yourself after your procedure. Your caregiver may also give you more specific instructions. Your treatment has been planned according to current medical practices, but problems sometimes occur. Call your caregiver if you have any problems or questions after your procedure. HOME CARE INSTRUCTIONS  Remove the bandage (dressing) over your chest tube site as directed by your caregiver.   It is normal to be sore for a couple weeks following surgery. See your caregiver if this seems to be getting worse rather than better.   Only take over-the-counter or prescription medicines for pain, discomfort, or fever as directed by your caregiver. It is very important to take pain medicine when you need it so that you will cough and breathe deeply enough to clear mucus (phlegm) and expand your lungs. This helps prevent a lung infection (pneumonia).   If it hurts to cough, hold a pillow against your chest when you cough. This may help with the discomfort. In spite of the discomfort, cough frequently.   Taking deep breaths keeps lungs inflated and protects against pneumonia. Most patients will go home with a device called an incentive spirometer that encourages deep breathing.   You may resume a normal diet and activities as directed.   Use showers for bathing until you see your caregiver, or as instructed.   Change dressings if necessary or as directed.   Avoid lifting or driving until you are instructed otherwise.   Make an appointment to see your caregiver for stitch (suture) or staple removal when instructed.   Do not travel by airplane for 2 weeks after the chest tube is removed.  SEEK MEDICAL CARE IF:  You are bleeding from your wounds.   Your heartbeat seems irregular.   You have redness, swelling, or increasing pain in the wounds.   There is pus coming from your  wounds.   There is a bad smell coming from the wound or dressing.  SEEK IMMEDIATE MEDICAL CARE IF:  You have a fever.   You develop a rash.   You have difficulty breathing.   You develop any reaction or side effects to medicines given.   You develop lightheadedness or feel faint.   You develop shortness of breath or chest pain.  MAKE SURE YOU:  Understand these instructions.   Will watch your condition.   Will get help right away if you are not doing well or get worse.  Document Released: 04/06/2011 Document Revised: 10/11/2011 Document Reviewed: 04/06/2011 [employer] Patient Information 2012 [employer]. 

## 2012-04-04 NOTE — Discharge Summary (Signed)
Physician Discharge Summary  Patient ID: [name] MRN: [id number] DOB/AGE: 62/03/1951 61 y.o.  Admit date: 03/17/2012 Discharge date: 04/04/2012  Admission Diagnoses:  Patient Active Problem List  Diagnoses  . Benign neoplasm of lymph nodes  . CIGARETTE SMOKER  . BRONCHOPNEUMONIA ORGANISM UNSPECIFIED  . C O P D  . PULMONARY NODULE  . DIZZINESS AND GIDDINESS  . DYSPNEA, CHRONIC  . EPIGASTRIC PAIN  . Hypertension  . DM type 2 (diabetes mellitus, type 2)  . Depression  . Sleep apnea  . Anemia  . Allergy  . GERD (gastroesophageal reflux disease)  . GERD (gastroesophageal reflux disease)  . COPD exacerbation  . Acute respiratory failure with hypoxia  . ARF (acute renal failure)  . Tachycardia  . Hypokalemia  . Hypotension  . Chest pain, atypical  . Acute renal failure   Discharge Diagnoses:   Patient Active Problem List  Diagnoses  . Benign neoplasm of lymph nodes  . CIGARETTE SMOKER  . BRONCHOPNEUMONIA ORGANISM UNSPECIFIED  . C O P D  . PULMONARY NODULE  . DIZZINESS AND GIDDINESS  . DYSPNEA, CHRONIC  . EPIGASTRIC PAIN  . Hypertension  . DM type 2 (diabetes mellitus, type 2)  . Depression  . Sleep apnea  . Anemia  . Allergy  . GERD (gastroesophageal reflux disease)  . GERD (gastroesophageal reflux disease)  . COPD exacerbation  . Acute respiratory failure with hypoxia  . ARF (acute renal failure)  . Tachycardia  . Hypokalemia  . Hypotension  . Chest pain, atypical  . Acute renal failure   S/P Thoracotomy   S/P Right Upper Lobectomy   Discharged Condition: good  History of Present Illness:   [name] is a 61 yo morbidly obese white male with known history of COPD, OSA, and significant smoking history.  He is routinely followed by Dr. Marchelle Gearing.  The patient developed a new onset of exertional dyspnea and right sided chest pain.  This had been occurring for approximately 6 months.  Workup began in February at which time the patient underwent  CT scan which showed a 1.4 x 1.3 cm irregular cavitary nodule in the posterior segment of the right upper lobe.  Patient underwent PET CT scan which showed some hypermetabolic activity within this nodule with a maximum SUV of 2.6.  There was no evidence of hypermetabolic lymph nodes identified.  The patient was monitored and underwent repeat CT scan on 02/28/2012.  This showed the existing cavitary nodule had grown in size to 1.6 x 1.8 vm.  The wall appeared to be slightly thicker and more irregular when compared to previous scan from February.  The patient was subsequently referred to [facility] for further evaluation.  He was evaluated by Dr. Laneta Simmers on 03/06/2012 at which time the patient admitted to continued cigarette use.  He continued to complain of exertional dyspnea, but denied any chest pain or pressure.  His imaging studies were reviewed and it was felt the patient should proceed with Right sided Thoracotomy with possible lung resection.  The benefits and risks of the procedure were explained to the patient and he was willing to proceed.  His surgery was tentatively scheduled for 03/17/2012.  Hospital Course:   [name] presented to [facility]17/[facility].  He was taken to the operating room and underwent a Right sided Thoracotomy with wedge resection of the right upper lobe nodule.  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  Due to the identification of malignancy Dr. Laneta Simmers proceeded to remove the remaining portion of the Right Upper Lobe.  The patient also underwent mediastinal lymph node resection.  The patient tolerated the procedure well.  He was extubated prior to leaving the operating room and was taken to the PACU in stable condition.  POD #0 the patient's chest tubes remained on suction with no evidence of air leak.  POD #1 patient was suffering from hyperglycemia and required insulin drip.  POD #2 the patient  became febrile with Leukocytosis.  Chest xray obtained revealed new bilateral infiltrates worse in the LLL.  He was placed on Vancomycin and Zosyn for HAP. The patients anterior chest tube was removed.  The patient was having issues with Tachycardia. He was started on Cardizem for rate control.  POD #3 patient developed agitation overnight.  He was placed on ativan for alcohol withdrawal.  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 His final chest tube was placed on water seal.  POD #4 the patients final chest tube was removed.  His agitation had improved, and he remained on ativan protocol.  POD #5 the patient developed dyspnea with wheezing.  Exam revealed increased subcutaneous emphysema R>L.  Chest xray confirmed right anterior pneumothorax, resulting in placement of right sided chest tube.  He also began to develop acute renal failure and was taken off all nephrotoxic agents.  POD #6 chest tube in place with improvement of subcutaneous emphysema.  He remained on antibiotics for HAP.  His creatinine continued to rise, patient with good urinary output. The patients abdomen was distended.  He denied nausea or vomiting.  KUB obtained showed possible mild Ileus, he was placed on clear liquid diet and was monitored.  POD #7 the patients chest tube with small air leak.  Continued improvement of emphysema, chest tube suction decreased to -10cm suction.  The patients continues to be in acute renal failure.  He has good urinary output.  POD #9 patient ambulating without difficulty.  He does desaturate, but uses oxygen at home regularly.  POD #10 patient having issues with AM hypoglycemia.  His PM dose of lantus was decreased.  POD #12 the patients chest tube was placed on water seal.  POD #13 no air leak appreciated in patients chest tube.  His chest tube was removed without difficulty.  POD #14 the patients chest xray did not show any evidence  of pneumothorax.  He was medically stable and transferred to the step down unit.  His HAP was resolved and he was taken off his antibiotics.  POD #15  The patient was hyperkalemia.  He was treated with Kayexalate and placed on IV hydration.  He continued to have increase in his creatinine level.  POD #16 the patient continued to have elevated creatinine. He did have good urinary output without diuresis.  Nephrology was consulted for further assistance.  They felt the patient was most likely suffering from ATN associated with nephrotoxic drugs post operatively.  POD #17 the patient's creatinine level began to trend down.  He was taken off his IV fluids.  POD #18 the patients creatinine continues to trend down, currently at 3.07.  The patient's is hyperkalemic again this morning with a potassium of 5.1. The patient is doing better.  The patient will resume his home oxygen settings and use bipap as previously ordered.  We will monitor him overnight.  If his creatinine continues to trend down  and his potassium remains stable he will be discharged home tomorrow 04/05/2012.  The patient will follow up with Dr. Laneta Simmers on 04/22/2012 at 3:00pm with a chest xray one hour prior to his appointment.  This can be performed at [facility] located on the first floor of [facility].  He will need to set up a follow up with Dr. Hyman Hopes at [facility] in 3-4 weeks.  He will need to follow up with Dr. Bonney Aid as scheduled  Discharge Instructions:  The patient will need to have BMET checked at [facility] on Monday 04/07/2012.  Patient may not drive for 2 weeks or while taking narcotic pain medications  Patient should avoid strenuous activity and heavy lifting for 2 weeks  Disposition: 01-Home or Self Care  Discharge Orders    Future Appointments: Provider: Department: Dept Phone: Center:   04/23/2012 9:00 AM Kalman Shan, MD [facility] [phone] None     Medication  List  As of 04/04/2012  8:51 AM   STOP taking these medications         gabapentin 300 MG capsule      losartan-hydrochlorothiazide 100-12.5 MG per tablet      metFORMIN 500 MG tablet      potassium chloride 10 MEQ tablet         TAKE these medications         acetaminophen 500 MG tablet   Commonly known as: TYLENOL   Take 1,500-2,000 mg by mouth 3 (three) times daily as needed. For pain      albuterol (2.5 MG/3ML) 0.083% nebulizer solution   Commonly known as: PROVENTIL   Take 2.5 mg by nebulization every 6 (six) hours as needed. For shortness of breath      aspirin EC 81 MG tablet   Take 81 mg by mouth daily.      budesonide-formoterol 160-4.5 MCG/ACT inhaler   Commonly known as: SYMBICORT   Inhale 2 puffs into the lungs 2 (two) times daily.      diltiazem 30 MG tablet   Commonly known as: CARDIZEM   Take 1 tablet (30 mg total) by mouth every 6 (six) hours.      FLUoxetine 40 MG capsule   Commonly known as: PROZAC   Take 40 mg by mouth daily.      folic acid 1 MG tablet   Commonly known as: FOLVITE   Take 1 tablet (1 mg total) by mouth daily.      insulin aspart 100 UNIT/ML injection   Commonly known as: novoLOG   Inject 30 Units into the skin 3 (three) times daily before meals.      insulin glargine 100 UNIT/ML injection   Commonly known as: LANTUS   Inject 100 Units into the skin every morning.      mulitivitamin with minerals Tabs   Take 1 tablet by mouth daily.      naphazoline-pheniramine 0.025-0.3 % ophthalmic solution   Commonly known as: NAPHCON-A   Place 1 drop into both eyes daily as needed. For dry eyes      omeprazole 20 MG capsule   Commonly known as: PRILOSEC   Take 20 mg by mouth daily.      oxyCODONE-acetaminophen 5-325 MG per tablet   Commonly known as: PERCOCET   Take 1-2 tablets by mouth every 4 (four) hours as needed.      thiamine 100 MG tablet   Take 1 tablet (100 mg total) by mouth daily.  tiotropium 18 MCG inhalation  capsule   Commonly known as: SPIRIVA   Place 18 mcg into inhaler and inhale daily.      traZODone 100 MG tablet   Commonly known as: DESYREL   Take 100 mg by mouth at bedtime.           Follow-up Information    Follow up with [facility], [place] on 04/23/2012. (Appt at 9:00 am)    Contact information:   [address] [place] [address] [phone]          Signed: Lowella Dandy 04/04/2012, 8:51 AM

## 2012-04-08 ENCOUNTER — Ambulatory Visit: Payer: Self-pay

## 2012-04-08 ENCOUNTER — Other Ambulatory Visit: Payer: Self-pay

## 2012-04-08 DIAGNOSIS — G8918 Other acute postprocedural pain: Secondary | ICD-10-CM

## 2012-04-08 MED ORDER — HYDROCODONE-ACETAMINOPHEN 5-500 MG PO CAPS: 1.0000 | ORAL_CAPSULE | ORAL | Status: AC | PRN

## 2012-04-08 NOTE — Telephone Encounter (Signed)
New RX for Lorcet 5/500mg  1-2 tabs po every 4-6 hours prn for pain called to [facility].

## 2012-04-17 ENCOUNTER — Other Ambulatory Visit: Payer: Self-pay | Admitting: Surgery

## 2012-04-17 DIAGNOSIS — D381 Neoplasm of uncertain behavior of trachea, bronchus and lung: Secondary | ICD-10-CM

## 2012-04-22 ENCOUNTER — Encounter: Payer: Self-pay | Admitting: Surgery

## 2012-04-22 ENCOUNTER — Ambulatory Visit (INDEPENDENT_AMBULATORY_CARE_PROVIDER_SITE_OTHER): Payer: Self-pay | Admitting: Surgery

## 2012-04-22 ENCOUNTER — Ambulatory Visit
Admission: RE | Admit: 2012-04-22 | Discharge: 2012-04-22 | Disposition: A | Discharge status: Home / Self Care | Payer: Managed Care, Other (non HMO) | Source: Ambulatory Visit | Attending: Surgery | Admitting: Surgery

## 2012-04-22 VITALS — BP 153/64 | HR 101 | Resp 21 | Ht 71.0 in | Wt 287.0 lb

## 2012-04-22 DIAGNOSIS — D381 Neoplasm of uncertain behavior of trachea, bronchus and lung: Secondary | ICD-10-CM

## 2012-04-22 DIAGNOSIS — J984 Other disorders of lung: Secondary | ICD-10-CM

## 2012-04-22 DIAGNOSIS — R911 Solitary pulmonary nodule: Secondary | ICD-10-CM

## 2012-04-23 ENCOUNTER — Ambulatory Visit (INDEPENDENT_AMBULATORY_CARE_PROVIDER_SITE_OTHER): Payer: Managed Care, Other (non HMO) | Admitting: Internal Medicine

## 2012-04-23 ENCOUNTER — Telehealth: Payer: Self-pay | Admitting: Internal Medicine

## 2012-04-23 ENCOUNTER — Inpatient Hospital Stay (HOSPITAL_COMMUNITY)
Admission: AD | Admit: 2012-04-23 | Discharge: 2012-04-25 | DRG: 641 | Disposition: A | Discharge status: Home / Self Care | Payer: Managed Care, Other (non HMO) | Source: Ambulatory Visit | Attending: Internal Medicine | Admitting: Internal Medicine

## 2012-04-23 ENCOUNTER — Other Ambulatory Visit (INDEPENDENT_AMBULATORY_CARE_PROVIDER_SITE_OTHER): Payer: Managed Care, Other (non HMO)

## 2012-04-23 ENCOUNTER — Encounter (HOSPITAL_COMMUNITY): Payer: Self-pay | Admitting: General Practice

## 2012-04-23 ENCOUNTER — Encounter: Payer: Self-pay | Admitting: Internal Medicine

## 2012-04-23 ENCOUNTER — Encounter: Payer: Self-pay | Admitting: Surgery

## 2012-04-23 VITALS — BP 120/64 | HR 102 | Temp 97.6°F | Ht 71.0 in | Wt 279.6 lb

## 2012-04-23 DIAGNOSIS — N19 Unspecified kidney failure: Secondary | ICD-10-CM

## 2012-04-23 DIAGNOSIS — Z79899 Other long term (current) drug therapy: Secondary | ICD-10-CM

## 2012-04-23 DIAGNOSIS — R5381 Other malaise: Secondary | ICD-10-CM

## 2012-04-23 DIAGNOSIS — G5793 Unspecified mononeuropathy of bilateral lower limbs: Secondary | ICD-10-CM | POA: Diagnosis present

## 2012-04-23 DIAGNOSIS — J4489 Other specified chronic obstructive pulmonary disease: Secondary | ICD-10-CM | POA: Diagnosis present

## 2012-04-23 DIAGNOSIS — G5790 Unspecified mononeuropathy of unspecified lower limb: Secondary | ICD-10-CM

## 2012-04-23 DIAGNOSIS — G4730 Sleep apnea, unspecified: Secondary | ICD-10-CM

## 2012-04-23 DIAGNOSIS — J449 Chronic obstructive pulmonary disease, unspecified: Secondary | ICD-10-CM | POA: Diagnosis present

## 2012-04-23 DIAGNOSIS — Z9889 Other specified postprocedural states: Secondary | ICD-10-CM

## 2012-04-23 DIAGNOSIS — Z7982 Long term (current) use of aspirin: Secondary | ICD-10-CM

## 2012-04-23 DIAGNOSIS — I252 Old myocardial infarction: Secondary | ICD-10-CM

## 2012-04-23 DIAGNOSIS — N179 Acute kidney failure, unspecified: Secondary | ICD-10-CM | POA: Diagnosis present

## 2012-04-23 DIAGNOSIS — T380X5A Adverse effect of glucocorticoids and synthetic analogues, initial encounter: Secondary | ICD-10-CM | POA: Diagnosis present

## 2012-04-23 DIAGNOSIS — D649 Anemia, unspecified: Secondary | ICD-10-CM | POA: Diagnosis present

## 2012-04-23 DIAGNOSIS — R5383 Other fatigue: Secondary | ICD-10-CM

## 2012-04-23 DIAGNOSIS — C3490 Malignant neoplasm of unspecified part of unspecified bronchus or lung: Secondary | ICD-10-CM

## 2012-04-23 DIAGNOSIS — F17200 Nicotine dependence, unspecified, uncomplicated: Secondary | ICD-10-CM | POA: Diagnosis present

## 2012-04-23 DIAGNOSIS — F3289 Other specified depressive episodes: Secondary | ICD-10-CM | POA: Diagnosis present

## 2012-04-23 DIAGNOSIS — G250 Essential tremor: Secondary | ICD-10-CM | POA: Diagnosis present

## 2012-04-23 DIAGNOSIS — E559 Vitamin D deficiency, unspecified: Secondary | ICD-10-CM

## 2012-04-23 DIAGNOSIS — K219 Gastro-esophageal reflux disease without esophagitis: Secondary | ICD-10-CM | POA: Diagnosis present

## 2012-04-23 DIAGNOSIS — G609 Hereditary and idiopathic neuropathy, unspecified: Secondary | ICD-10-CM | POA: Diagnosis present

## 2012-04-23 DIAGNOSIS — E876 Hypokalemia: Secondary | ICD-10-CM | POA: Diagnosis present

## 2012-04-23 DIAGNOSIS — G4733 Obstructive sleep apnea (adult) (pediatric): Secondary | ICD-10-CM | POA: Diagnosis present

## 2012-04-23 DIAGNOSIS — Z85118 Personal history of other malignant neoplasm of bronchus and lung: Secondary | ICD-10-CM

## 2012-04-23 DIAGNOSIS — I10 Essential (primary) hypertension: Secondary | ICD-10-CM | POA: Diagnosis present

## 2012-04-23 DIAGNOSIS — Z794 Long term (current) use of insulin: Secondary | ICD-10-CM

## 2012-04-23 DIAGNOSIS — Z87891 Personal history of nicotine dependence: Secondary | ICD-10-CM

## 2012-04-23 DIAGNOSIS — G4700 Insomnia, unspecified: Secondary | ICD-10-CM | POA: Diagnosis present

## 2012-04-23 DIAGNOSIS — C3411 Malignant neoplasm of upper lobe, right bronchus or lung: Secondary | ICD-10-CM | POA: Diagnosis present

## 2012-04-23 DIAGNOSIS — E119 Type 2 diabetes mellitus without complications: Secondary | ICD-10-CM | POA: Diagnosis present

## 2012-04-23 DIAGNOSIS — F329 Major depressive disorder, single episode, unspecified: Secondary | ICD-10-CM | POA: Diagnosis present

## 2012-04-23 DIAGNOSIS — Z902 Acquired absence of lung [part of]: Secondary | ICD-10-CM

## 2012-04-23 DIAGNOSIS — I2510 Atherosclerotic heart disease of native coronary artery without angina pectoris: Secondary | ICD-10-CM | POA: Diagnosis present

## 2012-04-23 DIAGNOSIS — E669 Obesity, unspecified: Secondary | ICD-10-CM | POA: Diagnosis present

## 2012-04-23 DIAGNOSIS — Z9861 Coronary angioplasty status: Secondary | ICD-10-CM

## 2012-04-23 HISTORY — DX: Type 2 diabetes mellitus without complications: E11.9

## 2012-04-23 HISTORY — DX: Angina pectoris, unspecified: I20.9

## 2012-04-23 HISTORY — DX: Unspecified osteoarthritis, unspecified site: M19.90

## 2012-04-23 HISTORY — DX: Chronic kidney disease, unspecified: N18.9

## 2012-04-23 LAB — BLOOD GAS, ARTERIAL
Acid-base deficit: 0.5 mmol/L (ref 0.0–2.0)
Drawn by: 324701
O2 Content: 3 L/min
O2 Saturation: 99.4 %
Patient temperature: 98.6
TCO2: 23.7 mmol/L (ref 0–100)

## 2012-04-23 LAB — BASIC METABOLIC PANEL
CO2: 25 mEq/L (ref 19–32)
Calcium: 5.8 mg/dL — ABNORMAL LOW (ref 8.4–10.5)
Chloride: 104 mEq/L (ref 96–112)
Creatinine, Ser: 1.2 mg/dL (ref 0.4–1.5)
Sodium: 140 mEq/L (ref 135–145)

## 2012-04-23 LAB — CARDIAC PANEL(CRET KIN+CKTOT+MB+TROPI)
CK, MB: 3.7 ng/mL (ref 0.3–4.0)
Relative Index: 1.2 (ref 0.0–2.5)
Total CK: 298 U/L — ABNORMAL HIGH (ref 7–232)
Total CK: 271 U/L — ABNORMAL HIGH (ref 7–232)

## 2012-04-23 LAB — PRO B NATRIURETIC PEPTIDE: Pro B Natriuretic peptide (BNP): 501.5 pg/mL — ABNORMAL HIGH (ref 0–125)

## 2012-04-23 LAB — PHOSPHORUS: Phosphorus: 3.7 mg/dL (ref 2.3–4.6)

## 2012-04-23 LAB — PROCALCITONIN: Procalcitonin: <0.1 ng/mL

## 2012-04-23 LAB — MAGNESIUM: Magnesium: 0.4 mg/dL — CL (ref 1.5–2.5)

## 2012-04-23 LAB — CBC
MCV: 87.8 fl (ref 78.0–100.0)
Platelets: 269 10*3/uL (ref 150.0–400.0)
RBC: 3.21 Mil/uL — ABNORMAL LOW (ref 4.22–5.81)

## 2012-04-23 LAB — CORTISOL: Cortisol, Plasma: 6.4 ug/dL

## 2012-04-23 LAB — LACTIC ACID, PLASMA: Lactic Acid, Venous: 1 mmol/L (ref 0.5–2.2)

## 2012-04-23 MED ORDER — SODIUM CHLORIDE 0.9 % IV SOLN
1.0000 g | Freq: Once | INTRAVENOUS | Status: AC
  Administered 2012-04-24: 1 g via INTRAVENOUS
  Filled 2012-04-23 (×3): qty 10

## 2012-04-23 MED ORDER — POTASSIUM CHLORIDE CRYS ER 20 MEQ PO TBCR
40.0000 meq | EXTENDED_RELEASE_TABLET | Freq: Once | ORAL | Status: AC
  Administered 2012-04-23: 40 meq via ORAL
  Filled 2012-04-23: qty 2

## 2012-04-23 MED ORDER — BECLOMETHASONE DIPROPIONATE 80 MCG/ACT IN AERS: 2.0000 | INHALATION_SPRAY | Freq: Two times a day (BID) | RESPIRATORY_TRACT | Status: DC

## 2012-04-23 MED ORDER — PANTOPRAZOLE SODIUM 40 MG PO TBEC
40.0000 mg | DELAYED_RELEASE_TABLET | Freq: Every day | ORAL | Status: DC
  Administered 2012-04-24 – 2012-04-25 (×2): 40 mg via ORAL
  Filled 2012-04-23 (×2): qty 1

## 2012-04-23 MED ORDER — SODIUM CHLORIDE 0.9 % IV SOLN: 250.0000 mL | INTRAVENOUS | Status: DC | PRN

## 2012-04-23 MED ORDER — ADULT MULTIVITAMIN W/MINERALS CH
1.0000 | ORAL_TABLET | Freq: Every day | ORAL | Status: DC
  Administered 2012-04-24 – 2012-04-25 (×2): 1 via ORAL
  Filled 2012-04-23 (×3): qty 1

## 2012-04-23 MED ORDER — FLUOXETINE HCL 20 MG PO CAPS
40.0000 mg | ORAL_CAPSULE | Freq: Every day | ORAL | Status: DC
  Administered 2012-04-24 – 2012-04-25 (×2): 40 mg via ORAL
  Filled 2012-04-23 (×3): qty 2

## 2012-04-23 MED ORDER — DILTIAZEM HCL 30 MG PO TABS
30.0000 mg | ORAL_TABLET | Freq: Four times a day (QID) | ORAL | Status: DC
  Administered 2012-04-23 – 2012-04-25 (×7): 30 mg via ORAL
  Filled 2012-04-23 (×12): qty 1

## 2012-04-23 MED ORDER — MAGNESIUM SULFATE 50 % IJ SOLN
4.0000 g | Freq: Once | INTRAMUSCULAR | Status: DC
  Filled 2012-04-23: qty 8

## 2012-04-23 MED ORDER — GABAPENTIN 300 MG PO CAPS
300.0000 mg | ORAL_CAPSULE | Freq: Three times a day (TID) | ORAL | Status: DC
  Administered 2012-04-23 – 2012-04-25 (×6): 300 mg via ORAL
  Filled 2012-04-23 (×8): qty 1

## 2012-04-23 MED ORDER — POTASSIUM CHLORIDE 10 MEQ/100ML IV SOLN
10.0000 meq | INTRAVENOUS | Status: AC
  Administered 2012-04-23 (×6): 10 meq via INTRAVENOUS
  Filled 2012-04-23 (×6): qty 100

## 2012-04-23 MED ORDER — ASPIRIN 81 MG PO CHEW
324.0000 mg | CHEWABLE_TABLET | ORAL | Status: AC
  Administered 2012-04-23: 324 mg via ORAL
  Filled 2012-04-23: qty 4

## 2012-04-23 MED ORDER — NAPHAZOLINE-PHENIRAMINE 0.025-0.3 % OP SOLN
1.0000 [drp] | Freq: Every day | OPHTHALMIC | Status: DC | PRN
  Filled 2012-04-23: qty 15

## 2012-04-23 MED ORDER — HEPARIN SODIUM (PORCINE) 5000 UNIT/ML IJ SOLN
5000.0000 [IU] | Freq: Three times a day (TID) | INTRAMUSCULAR | Status: DC
Start: 2012-04-23 — End: 2012-04-25
  Administered 2012-04-23 – 2012-04-25 (×6): 5000 [IU] via SUBCUTANEOUS
  Filled 2012-04-23 (×9): qty 1

## 2012-04-23 MED ORDER — TIOTROPIUM BROMIDE MONOHYDRATE 18 MCG IN CAPS
18.0000 ug | ORAL_CAPSULE | Freq: Every day | RESPIRATORY_TRACT | Status: DC
  Administered 2012-04-24 – 2012-04-25 (×2): 18 ug via RESPIRATORY_TRACT
  Filled 2012-04-23: qty 5

## 2012-04-23 MED ORDER — LEVALBUTEROL HCL 0.63 MG/3ML IN NEBU: INHALATION_SOLUTION | RESPIRATORY_TRACT | Status: DC

## 2012-04-23 MED ORDER — GABAPENTIN 600 MG PO TABS
300.0000 mg | ORAL_TABLET | Freq: Three times a day (TID) | ORAL | Status: DC
  Filled 2012-04-23 (×2): qty 0.5

## 2012-04-23 MED ORDER — BECLOMETHASONE DIPROPIONATE 80 MCG/ACT IN AERS
2.0000 | INHALATION_SPRAY | Freq: Two times a day (BID) | RESPIRATORY_TRACT | Status: DC
  Administered 2012-04-23 – 2012-04-25 (×4): 2 via RESPIRATORY_TRACT
  Filled 2012-04-23: qty 8.7

## 2012-04-23 MED ORDER — ASPIRIN EC 81 MG PO TBEC
81.0000 mg | DELAYED_RELEASE_TABLET | Freq: Every day | ORAL | Status: DC
  Administered 2012-04-24 – 2012-04-25 (×2): 81 mg via ORAL
  Filled 2012-04-23 (×2): qty 1

## 2012-04-23 MED ORDER — SODIUM CHLORIDE 0.9 % IV SOLN
1.0000 g | Freq: Once | INTRAVENOUS | Status: AC
  Administered 2012-04-23: 1 g via INTRAVENOUS
  Filled 2012-04-23: qty 10

## 2012-04-23 MED ORDER — LEVALBUTEROL HCL 0.63 MG/3ML IN NEBU
0.6300 mg | INHALATION_SOLUTION | Freq: Four times a day (QID) | RESPIRATORY_TRACT | Status: DC
  Administered 2012-04-23 – 2012-04-25 (×7): 0.63 mg via RESPIRATORY_TRACT
  Filled 2012-04-23 (×12): qty 3

## 2012-04-23 MED ORDER — INSULIN GLARGINE 100 UNIT/ML ~~LOC~~ SOLN: 100.0000 [IU] | Freq: Every morning | SUBCUTANEOUS | Status: DC

## 2012-04-23 MED ORDER — TRAZODONE HCL 100 MG PO TABS
100.0000 mg | ORAL_TABLET | Freq: Every day | ORAL | Status: DC
  Administered 2012-04-23 – 2012-04-24 (×2): 100 mg via ORAL
  Filled 2012-04-23 (×3): qty 1

## 2012-04-23 MED ORDER — FLUOXETINE HCL 40 MG PO CAPS: 40.0000 mg | ORAL_CAPSULE | Freq: Every day | ORAL | Status: DC

## 2012-04-23 MED ORDER — INSULIN ASPART 100 UNIT/ML ~~LOC~~ SOLN
0.0000 [IU] | Freq: Three times a day (TID) | SUBCUTANEOUS | Status: DC
  Administered 2012-04-25: 2 [IU] via SUBCUTANEOUS

## 2012-04-23 MED ORDER — MAGNESIUM SULFATE 40 MG/ML IJ SOLN
4.0000 g | Freq: Once | INTRAMUSCULAR | Status: AC
  Administered 2012-04-23: 4 g via INTRAVENOUS
  Filled 2012-04-23: qty 100

## 2012-04-23 MED ORDER — INSULIN ASPART 100 UNIT/ML ~~LOC~~ SOLN: 30.0000 [IU] | Freq: Three times a day (TID) | SUBCUTANEOUS | Status: DC

## 2012-04-23 MED ORDER — ASPIRIN 300 MG RE SUPP
300.0000 mg | RECTAL | Status: AC
  Filled 2012-04-23: qty 1

## 2012-04-23 NOTE — H&P (Addendum)
Patient name: [name] Medical record number: [id number] Date of birth: 07/27/1951 Age: 61 y.o. Gender: male PCP: Leo Grosser, MD Physicians:   - Dr Laneta Simmers CVTS  - Dr Marchelle Gearing - PCCM  - Dr Hyman Hopes - renal  LOS 0 days Date of admit 04/23/2012 12:34 PM   Date: 04/23/2012    Brief history: Post lobectomy May 2013. Complicated ICU course with leak and ATN. Seen on pulmonary clinic on 04/23/2012 With multilple complaints and found to have profound depletion in calcium, potassium and magnesium and admitted  Lines/tubes None  Culture data/sepsis markers None  Antibiotics None  Best practice protonix  heparin   Protocols/consults  Events/studies   BACKGROUND PROBLEMS   1. tobacco abuse (wellbutrin intolerance hx - insomnia in early 2011, chantix intolerance )  - quit Nov 2012 after admission for pna  - relapsed Jan/FEb 2013  - quit 03/17/12 at time of lobectomy  2A. Copd Gold stage 2 (prelobectomy)nos  - spirometry March 2013: fev1 2.Marland Kitchen4L/63%, Ratio 93 -  - continuous O2 since May 2013 lobectomy  2B. AECOPD  - Nov 2012 admit for pneumonia  - Jan 2013 OPD Rx  - March 2013 - presumed opd Rx   3. Mediastinal nodes  Stable Oct 2009 - July 2011; no further fu   4.RUL pulmonary nodule - STAGE 1A NSCLC with asssociated carcinod. S/p lobectomy RUL 03/17/12.  - course complicated by 2 week hospital course due to delirium, air leak and acute renal failure  5. Obesity/osa on cpap and cough  Body mass index is 42.40 kg/(m^2). on 12/21/2011  Body mass index is 42.40 kg/(m^2). on 12/21/2011   6. RLL pulmonary scarring  Stable CT 2008 -> 2011; no further fu    7. CAD - s/p stent    HPI  61 year old male. Dischargted from hospital > 2 weeks ago on 03/17/12 following RUL lobectomy for stage 1A NSCLC and prolonged 2 week hospital course (see above) complicated by ATN (never got cvvh), delirium and air  Leak. Returns 04/23/2012 for followup with wife. Smoking still in  remission.  Multiple complaints   - tremors new x 1 week following starting new symbicort   - return of lower extremity neuropathic pain following dc of his home neurontin at prior admission due to renal failure   - smoking still in remission; no longer angry man but more docile   - intense fatigue, that is of insidious onset and progressive and constant and associtaed with class 3 dyapnea   - denies ae-copd symptoms like increase in cough, wheeze, sputum, fever   - wife has lot of questions on medications - wants to dc thiamine and folic acid and wants him on simpler cardizem regimen  - they both say CXR yesterday at Dr Laneta Simmers office shows improvement and creatinine last week with Dr Hyman Hopes was down to 2mg %. So, they are puzzled about her symptoms   Post office visit labs showed normalization of creatinine but severe life threatening depletions in potassium, magnesium and calcium. So, directly admitted    Past Medical History  Diagnosis Date  . Anemia   . Allergy   . Thyroid disease   . Parathyroid disease   . Headache   . Hypertension     takes  HYzaar daily  . Coronary artery disease     has 1 stent  . Myocardial infarct   . Asthma   . Emphysema     sees Dr.Ramaswami for this  . COPD (chronic obstructive  pulmonary disease)     uses Albuterol and Spiriva daily  . Shortness of breath     with exertion   . Pneumonia     hx of last time in 2012  . Bronchitis   . Productive cough     white in color but no odor  . Sleep apnea     uses BiPaP  . Peripheral neuropathy   . Lung mass     right upper lobe  . Back pain     4 deteriorating disc and receives an injection q3-20mon;has been doing this for about 35yrs  . H/O hiatal hernia   . GERD (gastroesophageal reflux disease)     takes Prilosec daily  . Blood transfusion     as a child  . Diabetes mellitus     takes Metformin bid and Novolog and Lantus daily  . Glaucoma     hx of  . Depression     takes Prozac daily    . Insomnia     takes Trazodone nightly    Past Surgical History  Procedure Date  . Carpal tunnel release     bilateral  . Rotator cuff repair   . Hernia repair 15+yrs ago    double inguinal  . Ulnar nerve surgery   . Cardiac catheterization 2006/2008/2009  . Coronary angioplasty     1 stent  . Colonoscopy   . Esophagogastroduodenoscopy   . Eye surgery     Family History  Problem Relation Age of Onset  . COPD Mother   . Heart attack Father   . Anesthesia problems Neg Hx   . Hypotension Neg Hx   . Malignant hyperthermia Neg Hx   . Pseudochol deficiency Neg Hx     Social History:  reports that he has been smoking Cigarettes.  He has a 45 pack-year smoking history. He has never used smokeless tobacco. He reports that he uses illicit drugs (Cocaine). He reports that he does not drink alcohol.  Allergies: No Known Allergies  Medications:  Prior to Admission medications   Medication Sig Start Date End Date Taking? Authorizing Provider  acetaminophen (TYLENOL) 500 MG tablet Take 1,500-2,000 mg by mouth 3 (three) times daily as needed. For pain    Historical Provider, MD  aspirin EC 81 MG tablet Take 81 mg by mouth daily.    Historical Provider, MD  beclomethasone (QVAR) 80 MCG/ACT inhaler Inhale 2 puffs into the lungs 2 (two) times daily. 04/23/12 04/23/13  Kalman Shan, MD  diltiazem (CARDIZEM) 30 MG tablet Take 1 tablet (30 mg total) by mouth every 6 (six) hours. 04/04/12 04/04/13  Erin R Barrett, PA  FLUoxetine (PROZAC) 40 MG capsule Take 40 mg by mouth daily.      Historical Provider, MD  insulin aspart (NOVOLOG) 100 UNIT/ML injection Inject 30 Units into the skin 3 (three) times daily before meals.     Historical Provider, MD  insulin glargine (LANTUS) 100 UNIT/ML injection Inject 100 Units into the skin every morning.  09/22/11   Cristal Ford, MD  levalbuterol Pauline Aus) 0.63 MG/3ML nebulizer solution 1 vial 4 times a day dx 496 04/23/12 04/23/13  Kalman Shan, MD   Multiple Vitamin (MULITIVITAMIN WITH MINERALS) TABS Take 1 tablet by mouth daily. 04/04/12   Erin R Barrett, PA  naphazoline-pheniramine (NAPHCON-A) 0.025-0.3 % ophthalmic solution Place 1 drop into both eyes daily as needed. For dry eyes    Historical Provider, MD  omeprazole (PRILOSEC) 20 MG capsule Take 20 mg by  mouth daily.      Historical Provider, MD  tiotropium (SPIRIVA) 18 MCG inhalation capsule Place 18 mcg into inhaler and inhale daily.      Historical Provider, MD  traZODone (DESYREL) 100 MG tablet Take 100 mg by mouth at bedtime.      Historical Provider, MD    Pertinent items are noted in HPI. Otherwise 11 point ROS negative  Temp:  [97.6 F (36.4 C)] 97.6 F (36.4 C) (06/19 0902) Pulse Rate:  [101-102] 102  (06/19 0902) Resp:  [21] 21  (06/18 1438) BP: (120-153)/(64) 120/64 mmHg (06/19 0902) SpO2:  [94 %-97 %] 97 % (06/19 0902) Weight:  [126.826 kg (279 lb 9.6 oz)-130.182 kg (287 lb)] 126.826 kg (279 lb 9.6 oz) (06/19 0902)   No intake or output data in the 24 hours ending 04/23/12 1246 Physical exam  There were no vitals taken for this visit.  General Appearance:  Obese. Looks more deconditioned. On oxygen  Head:    Normocephalic, without obvious abnormality, atraumatic  Eyes:    PERRL, conjunctiva/corneas clear, EOM's intact, fundi    benign, both eyes       Ears:    Normal TM's and external ear canals, both ears  Nose:   Nares normal, septum midline, mucosa normal, no drainage   or sinus tenderness  Throat:   Lips, mucosa, and tongue normal; teeth and gums normal. CLass 3 Mallampatti class  Neck:   Supple, symmetrical, trachea midline, no adenopathy;       thyroid:  No enlargement/tenderness/nodules; no carotid   bruit or JVD  Back:     Symmetric, no curvature, ROM normal, no CVA tenderness  Lungs:      respirations unlabored but does have purse lip breathing +. Some wheeze esp in right upper lobe +  Chest wall:    No tenderness or deformity  Heart:    Regular  rate and rhythm, S1 and S2 normal, no murmur, rub   or gallop  Abdomen:     Soft, non-tender, bowel sounds active all four quadrants,    no masses, no organomegaly  Genitalia:    Normal male without lesion, discharge or tenderness  Rectal:    Normal tone, normal prostate, no masses or tenderness;   guaiac negative stool  Extremities:   Extremities normal, atraumatic, no cyanosis or edema. SIGNIFICANT TREMORS in HANDS +  Pulses:   2+ and symmetric all extremities  Skin:   Skin color, texture, turgor normal, no rashes or lesions  Lymph nodes:   Cervical, supraclavicular, and axillary nodes normal  Neurologic:   CNII-XII intact. Normal strength, sensation and reflexes      throughout    radiology   Dg Chest 2 View  04/22/2012  *RADIOLOGY REPORT*  Clinical Data: History of thoracotomy and right lobectomy, follow- up  CHEST - 2 VIEW  Comparison: Portable chest x-ray of 04/02/2012  Findings: Aeration of the lungs has improved somewhat.  Pleural and parenchymal opacities remain particularly at the right lung base and  in the periphery of the right upper hemithorax.  No effusion is seen.  Cardiomegaly is stable.  No acute bony abnormality is noted.  IMPRESSION: Improved aeration.  Persistent pleural and parenchymal opacities on the right postoperatively.  Original Report Authenticated By: Juline Patch, M.D.    LAB RESULT Lab Results  Component Value Date   CREATININE 1.2 04/23/2012   BUN 13 04/23/2012   NA 140 04/23/2012   K 2.9* 04/23/2012   CL 104  04/23/2012   CO2 25 04/23/2012   Lab Results  Component Value Date   WBC 10.3 04/23/2012   HGB 9.4* 04/23/2012   HCT 28.2* 04/23/2012   MCV 87.8 04/23/2012   PLT 269.0 04/23/2012   Lab Results  Component Value Date   ALT 34 03/19/2012   AST 79* 03/19/2012   ALKPHOS 66 03/19/2012   BILITOT 0.6 03/19/2012   Lab Results  Component Value Date   INR 1.00 03/13/2012   INR 0.91 11/13/2011   INR 1.0 12/20/2008    Lab 04/23/12 0938  NA 140  K 2.9*  CL  104  CO2 25  GLUCOSE 111*  BUN 13  CREATININE 1.2  CALCIUM 5.8*  MG 0.4*  PHOS 3.7   Patient Active Hospital Problem List: Hypomagnesemia (04/23/2012)   Assessment: Critically low   Plan: Replace with 4gm and rechecl  Hypokalemia (09/20/2011)   Assessment: Critically low and life threatening   Plan: replete viia IV and via po  Hypocalcemia (04/23/2012)   Assessment: Criticallylow   Plan: Replete and rechecl  C O P D  With chronic resp failure(08/22/2007)   Assessment: Appears stable. Though deconditioned   Plan: Baseline nebs, get pt invlved, check spirometry  Hypertension ()   Assessment: Under control but he does not like the cardizem 4 time sdaily   Plan: Change to bid or once day regimen  DM type 2 (diabetes mellitus, type 2) ()   Assessment: non acute   Plan: ssi  Depression ()   Assessment: ? More depressed since surgery   Plan: Will administer depression screen when metabolically better  Sleep apnea ()   Assessment: Baseline   Plan: use home cpap  Anemia)   Assessment: Anemia of critical illlness. Hgb 9s and slowly better   Plan: - PRBC for hgb </= 6.9gm%    - exceptions are   -  if ACS susepcted/confirmed then transfuse for hgb </= 8.0gm%,  or    -  If septic shock first 24h and scvo2 < 70% then transfuse for hgb </= 9.0gm%   - active bleeding with hemodynamic instability, then transfuse regardless of hemoglobin value   At at all times try to transfuse 1 unit prbc as possible with exception of active hemorrhage   NEUROPATHIC PAIN  Assessment: relapsed after stopping neurotnin in setting of renal failure severl weeks ago  PLAn: restart neurontin now that creat is normal  CIGARETTE SMOKER (09/25/2007)   Assessment: Quit May 2013 at surgery   Plan: continue remission  Non-small cell carcinoma of lung, stage 1 (03/17/2012)   Assessment: Nil acute   Plan: No chemo plans  Acute renal failure (03/22/2012)   Assessment: resolved on 04/23/2012    Plan: monitor  S/P lobectomy of lung (04/04/2012)   Assessment: Healing wound +. No pain   Plan: Inform Dr Laneta Simmers of admit via epic routing   The patient is critically ill with single organ failure of critically low multiple electrolytes. Requires high complexity decision making for assessment and support, frequent evaluation and titration of therapies, application of advanced monitoring technologies and extensive interpretation of multiple databases.   Critical Care Time devoted to patient care services described in this note is  45  Minutes.  Dr. Kalman Shan, M.D., [facility].C.P Pulmonary and Critical Care Medicine Staff Physician [facility] Pager: [phone], If no answer or between  15:00h - 7:00h: call [phone]  04/23/2012 1:25 PM

## 2012-04-23 NOTE — Patient Instructions (Addendum)
#  Tremors and COPD   - stop symbicort and albuterol  - take xopenex nebulizer 4 times daily and then as needed  - continue spiriva daily - start qvar 2 puff twice daily  #Smoking  - please stay quit; congratulations  #Lung cancer  - glad this is removed  #neuropathic pain  - depending on blood test results today, will call you with neurontin restart dose  #Fatigue  - have ordered home physical therapy  #Cardizem dose  - I will talk to Dr Katrinka Blazing and figure out how best to change this  #General health  -  Check vitamin d level  - stop folic acid and thiamine  - continue multivitamin one daily  #Followup  - 2-3 weeks with Rubye Oaks with medicine calendar

## 2012-04-23 NOTE — Telephone Encounter (Signed)
Per MR pt went home and then MR called him and sent him to be admitted.

## 2012-04-23 NOTE — Progress Notes (Signed)
[address]            [name] [address]          [phone]     HPI:  Patient returns for routine postoperative follow-up having undergone right thoracotomy with wedge resection of a right upper lobe lung nodule and right upper lobectomy with mediastinal lymph node dissection on 03/17/2012. The patient's early postoperative recovery while in the hospital was notable for development of postoperative delirium tremens as well as bilateral ARDS/pneumonia. He also developed acute renal failure with his creatinine rising from normal to almost 5 which was probably related to aggressive diuresis, Toradol, and some mild hypotension. He was seen in consultation by nephrology and after discontinuation of Toradol and diuresis his creatinine began to improve. 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 Since hospital discharge the patient reports that he is continuing to improve. He is watching his diet and has lost considerable weight. He was using nasal oxygen at 5 L per minute when he initially went home and is now down to 2 L per minute with oxygen saturations at rest in the mid 90s. He said that he still desaturates into the 80s with ambulation if he is not using oxygen. He denies any cough or sputum production. He has had no fever or chills.   No current facility-administered medications for this visit.   No current outpatient prescriptions on file.   Facility-Administered Medications Ordered in Other Visits  Medication Dose Route Frequency Provider Last Rate Last Dose  . 0.9 %  sodium chloride infusion  250 mL Intravenous PRN Kalman Shan, MD      . aspirin chewable tablet 324 mg  324 mg Oral NOW Kalman Shan, MD   324 mg at 04/23/12 1546   Or  . aspirin suppository 300 mg  300 mg Rectal NOW Kalman Shan, MD      . aspirin EC tablet 81 mg  81 mg Oral Daily Kalman Shan, MD      . beclomethasone  (QVAR) 80 MCG/ACT inhaler 2 puff  2 puff Inhalation BID Kalman Shan, MD      . calcium gluconate 1 g in sodium chloride 0.9 % 100 mL IVPB  1 g Intravenous Once Kalman Shan, MD   1 g at 04/23/12 1558  . diltiazem (CARDIZEM) tablet 30 mg  30 mg Oral Q6H Kalman Shan, MD   30 mg at 04/23/12 1800  . FLUoxetine (PROZAC) capsule 40 mg  40 mg Oral Daily Kalman Shan, MD      . gabapentin (NEURONTIN) capsule 300 mg  300 mg Oral TID Kalman Shan, MD   300 mg at 04/23/12 1547  . heparin injection 5,000 Units  5,000 Units Subcutaneous Q8H Kalman Shan, MD   5,000 Units at 04/23/12 1602  . insulin aspart (novoLOG) injection 0-15 Units  0-15 Units Subcutaneous TID WC Kalman Shan, MD      . insulin glargine (LANTUS) injection 100 Units  100 Units Subcutaneous q morning - 10a Kalman Shan, MD      . levalbuterol (XOPENEX) nebulizer solution 0.63 mg  0.63 mg Nebulization Q6H Kalman Shan, MD      . magnesium sulfate IVPB 4 g 100 mL  4 g Intravenous Once Kalman Shan, MD   4 g at 04/23/12 1703  . multivitamin with minerals tablet 1 tablet  1 tablet Oral Daily Kalman Shan, MD      . naphazoline-pheniramine (NAPHCON-A) 0.025-0.3 % ophthalmic solution 1 drop  1 drop Both Eyes Daily PRN Kalman Shan, MD      . pantoprazole (PROTONIX) EC tablet 40 mg  40 mg Oral Q1200 Kalman Shan, MD      . potassium chloride 10 mEq in 100 mL IVPB  10 mEq Intravenous Q1 Hr x 6 Kalman Shan, MD   10 mEq at 04/23/12 1801  . potassium chloride SA (K-DUR,KLOR-CON) CR tablet 40 mEq  40 mEq Oral Once Kalman Shan, MD   40 mEq at 04/23/12 1600  . potassium chloride SA (K-DUR,KLOR-CON) CR tablet 40 mEq  40 mEq Oral Once Kalman Shan, MD      . tiotropium (SPIRIVA) inhalation capsule 18 mcg  18 mcg Inhalation Daily Kalman Shan, MD      . traZODone (DESYREL) tablet 100 mg  100 mg Oral QHS Kalman Shan, MD      . DISCONTD: FLUoxetine (PROZAC) capsule 40 mg  40 mg Oral  Daily Kalman Shan, MD      . DISCONTD: gabapentin (NEURONTIN) tablet 300 mg  300 mg Oral TID Kalman Shan, MD      . DISCONTD: insulin aspart (novoLOG) injection 30 Units  30 Units Subcutaneous TID AC Kalman Shan, MD      . DISCONTD: magnesium sulfate 4 g in dextrose 5 % 250 mL  4 g Intravenous Once Kalman Shan, MD        Physical Exam: BP 153/64  Pulse 101  Resp 21  Ht 5\' 11"  (1.803 m)  Wt 287 lb (130.182 kg)  BMI 40.03 kg/m2  SpO2 94% He looks much improved from his condition at discharge. He is breathing comfortably. Lung exam reveals a few rales bilaterally. There is no wheezing. The right thoracotomy incision is healing well. 2 of the chest tube incisions are well-healed and the third has slight serous drainage. There is no sign of infection There is no peripheral edema.   Diagnostic Tests:  Final Pathology 1. Lung, wedge biopsy/resection, right upper lobe - SQUAMOUS CELL CARCINOMA, 1.9 CM IN DIMENSION. - MARGIN FREE OF CARCINOMA. - SEE COMMENT. 2. Lung, resection (segmental or lobe), Right upper - CARCINOID TUMORLET, 0.3 CM IN DIMENSION. - MARGIN FREE OF CARCINOMA. - FOUR LYMPH NODES, NEGATIVE FOR CARCINOMA (0/4). 3. Lymph node, biopsy, Hilar - ONE LYMPH NODE, NEGATIVE FOR CARCINOMA (0/1). 4. Lymph node, biopsy, 10R - ONE LYMPH NODE, NEGATIVE FOR CARCINOMA (0/1). Microscopic Comment 1. LUNG Specimen, including laterality: Right lung Procedure: Segmental/lobe resection Specimen integrity (intact/disrupted): Intact Tumor site: Upper lobe Tumor focality: Single focus Maximum tumor size (cm): 1.9 cm (gross measurement) Histologic type: Squamous cell carcinoma Grade: Moderately differentiated (G2) Margins: Uninvolved by carcinoma Visceral pleura invasion: Not identified Tumor extension: Tumor limited to pulmonary parenchyma Treatment effect (if treated with neoadjuvant therapy): N/A Lymph -Vascular invasion: Not identified Lymph nodes: Number  examined - 6; Number N1 nodes positive 0; Number N2 nodes positive N/A TNM code: pT1a, pN0 Ancillary studies: None performed 1 of 3  *RADIOLOGY REPORT*   Clinical Data: History of thoracotomy and right lobectomy, follow- up   CHEST - 2 VIEW   Comparison: Portable chest x-ray of 04/02/2012   Findings: Aeration of the lungs has improved somewhat.  Pleural and parenchymal opacities remain particularly at the right lung base and  in the periphery of the right upper hemithorax.  No effusion is seen.  Cardiomegaly is stable.  No  acute bony abnormality is noted.   IMPRESSION: Improved aeration.  Persistent pleural and parenchymal opacities on the right postoperatively.   Original Report Authenticated By: Juline Patch, M.D.  Impression:  Overall I think he is making good progress following his complicated postoperative recovery. He had significant COPD preoperatively and marginal lung function to tolerate lobectomy especially considering his obesity. I would expect him to continue improving and he should be able to wean off oxygen over the next few months. He said that his creatinine is continuing to decline and hopefully his renal function will return to his baseline. I encouraged him to continue walking as much as possible and use his incentive spirometer. His wife is checking his oxygen saturation at home with a pulse oximeter and he will wean his oxygen as tolerated to maintain oxygen saturation of greater than 90%  Plan:  I will see him back in 3 months and we'll repeat his chest x-ray at that time.

## 2012-04-24 ENCOUNTER — Inpatient Hospital Stay (HOSPITAL_COMMUNITY): Payer: Managed Care, Other (non HMO)

## 2012-04-24 DIAGNOSIS — G4730 Sleep apnea, unspecified: Secondary | ICD-10-CM

## 2012-04-24 DIAGNOSIS — E1149 Type 2 diabetes mellitus with other diabetic neurological complication: Secondary | ICD-10-CM

## 2012-04-24 DIAGNOSIS — J449 Chronic obstructive pulmonary disease, unspecified: Secondary | ICD-10-CM

## 2012-04-24 LAB — GLUCOSE, CAPILLARY
Glucose-Capillary: 91 mg/dL (ref 70–99)
Glucose-Capillary: 111 mg/dL — ABNORMAL HIGH (ref 70–99)
Glucose-Capillary: 137 mg/dL — ABNORMAL HIGH (ref 70–99)
Glucose-Capillary: 173 mg/dL — ABNORMAL HIGH (ref 70–99)

## 2012-04-24 LAB — CBC
MCHC: 33.5 g/dL (ref 30.0–36.0)
Platelets: 255 10*3/uL (ref 150–400)
RDW: 14.8 % (ref 11.5–15.5)
WBC: 10.7 10*3/uL — ABNORMAL HIGH (ref 4.0–10.5)

## 2012-04-24 LAB — BASIC METABOLIC PANEL
BUN: 11 mg/dL (ref 6–23)
Calcium: 6.5 mg/dL — ABNORMAL LOW (ref 8.4–10.5)
Chloride: 106 mEq/L (ref 96–112)
Creatinine, Ser: 1.02 mg/dL (ref 0.50–1.35)
GFR calc Af Amer: 89 mL/min — ABNORMAL LOW (ref >90)
GFR calc non Af Amer: 77 mL/min — ABNORMAL LOW (ref >90)

## 2012-04-24 LAB — PHOSPHORUS: Phosphorus: 3.6 mg/dL (ref 2.3–4.6)

## 2012-04-24 LAB — VITAMIN D 25 HYDROXY (VIT D DEFICIENCY, FRACTURES): Vit D, 25-Hydroxy: 36 ng/mL (ref 30–89)

## 2012-04-24 LAB — MAGNESIUM: Magnesium: 1.1 mg/dL — ABNORMAL LOW (ref 1.5–2.5)

## 2012-04-24 LAB — CARDIAC PANEL(CRET KIN+CKTOT+MB+TROPI)
Relative Index: 1.3 (ref 0.0–2.5)
Total CK: 224 U/L (ref 7–232)

## 2012-04-24 MED ORDER — POTASSIUM CHLORIDE CRYS ER 20 MEQ PO TBCR
40.0000 meq | EXTENDED_RELEASE_TABLET | Freq: Two times a day (BID) | ORAL | Status: AC
  Administered 2012-04-24 (×2): 40 meq via ORAL
  Filled 2012-04-24 (×2): qty 2

## 2012-04-24 MED ORDER — INSULIN GLARGINE 100 UNIT/ML ~~LOC~~ SOLN
50.0000 [IU] | Freq: Every morning | SUBCUTANEOUS | Status: DC
  Administered 2012-04-25: 50 [IU] via SUBCUTANEOUS

## 2012-04-24 MED ORDER — SODIUM CHLORIDE 0.9 % IV SOLN
1.0000 g | Freq: Once | INTRAVENOUS | Status: AC
  Administered 2012-04-24: 1 g via INTRAVENOUS
  Filled 2012-04-24: qty 10

## 2012-04-24 MED ORDER — CALCIUM CARBONATE 1250 (500 CA) MG PO TABS
1.0000 | ORAL_TABLET | Freq: Every day | ORAL | Status: DC
  Administered 2012-04-24 – 2012-04-25 (×2): 500 mg via ORAL
  Filled 2012-04-24 (×2): qty 1

## 2012-04-24 MED ORDER — MAGNESIUM SULFATE 40 MG/ML IJ SOLN
2.0000 g | Freq: Once | INTRAMUSCULAR | Status: AC
  Administered 2012-04-24: 2 g via INTRAVENOUS
  Filled 2012-04-24: qty 50

## 2012-04-24 NOTE — Care Management Note (Unsigned)
    Page 1 of 1   04/24/2012     9:44:26 AM   CARE MANAGEMENT NOTE 04/24/2012  Patient:  [name], [name]   Account Number:  [id number]  Date Initiated:  04/24/2012  Documentation initiated by:  GRAVES-BIGELOW,Declyn Delsol  Subjective/Objective Assessment:   Pt admitted with low calcium, k+ and mag. Repleated on admit. Pt is from home with wife.     Action/Plan:   CM will continue to monitor for disposition needs.   Anticipated DC Date:  04/26/2012   Anticipated DC Plan:  HOME/SELF CARE      DC Planning Services  CM consult      Choice offered to / List presented to:             Status of service:  In process, will continue to follow Medicare Important Message given?   (If response is "NO", the following Medicare IM given date fields will be blank) Date Medicare IM given:   Date Additional Medicare IM given:    Discharge Disposition:    Per UR Regulation:  Reviewed for med. necessity/level of care/duration of stay  If discussed at Long Length of Stay Meetings, dates discussed:    Comments:

## 2012-04-24 NOTE — Progress Notes (Signed)
Patient name: [name] Medical record number: [id number] Date of birth: 1951/08/23 Age: 61 y.o. Gender: male PCP: Leo Grosser, MD Physicians:   - Dr Laneta Simmers CVTS  - Dr Marchelle Gearing - PCCM  - Dr Hyman Hopes - renal  LOS 1 days Date of admit 04/23/2012 12:34 PM   Date: 04/24/2012    Brief history: Post lobectomy May 2013. Complicated ICU course with leak and ATN. Seen on pulmonary clinic on 04/23/2012 With multilple complaints and found to have profound depletion in calcium, potassium and magnesium and admitted  Lines/tubes None  Culture data/sepsis markers None  Antibiotics None  Best practice protonix  heparin   Protocols/consults  Events/studies   BACKGROUND PROBLEMS   1. tobacco abuse (wellbutrin intolerance hx - insomnia in early 2011, chantix intolerance )  - quit Nov 2012 after admission for pna  - relapsed Jan/FEb 2013  - quit 03/17/12 at time of lobectomy  2A. Copd Gold stage 2 (prelobectomy)nos  - spirometry March 2013: fev1 2.Marland Kitchen4L/63%, Ratio 93 -  - continuous O2 since May 2013 lobectomy  2B. AECOPD  - Nov 2012 admit for pneumonia  - Jan 2013 OPD Rx  - March 2013 - presumed opd Rx   3. Mediastinal nodes  Stable Oct 2009 - July 2011; no further fu   4.RUL pulmonary nodule - STAGE 1A NSCLC with asssociated carcinod. S/p lobectomy RUL 03/17/12.  - course complicated by 2 week hospital course due to delirium, air leak and acute renal failure  5. Obesity/osa on cpap and cough  Body mass index is 42.40 kg/(m^2). on 12/21/2011  Body mass index is 42.40 kg/(m^2). on 12/21/2011   6. RLL pulmonary scarring  Stable CT 2008 -> 2011; no further fu    7. CAD - s/p stent    HPI  61 year old male. Dischargted from hospital > 2 weeks ago on 03/17/12 following RUL lobectomy for stage 1A NSCLC and prolonged 2 week hospital course (see above) complicated by ATN (never got cvvh), delirium and air  Leak. Returns 04/23/2012 for followup with wife. Smoking still in  remission.  Multiple complaints    - tremors new x 1 week following starting new symbicort   - return of lower extremity neuropathic pain following dc of his home neurontin at prior admission due to renal failure   - smoking still in remission; no longer angry man but more docile   - intense fatigue, that is of insidious onset and progressive and constant and associtaed with class 3 dyapnea   - denies ae-copd symptoms like increase in cough, wheeze, sputum, fever   - wife has lot of questions on medications - wants to dc thiamine and folic acid and wants him on simpler cardizem regimen  - they both say CXR yesterday at Dr Laneta Simmers office shows improvement and creatinine last week with Dr Hyman Hopes was down to 2mg %. So, they are puzzled about her symptoms   Post office visit labs showed normalization of creatinine but severe life threatening depletions in potassium, magnesium and calcium. So, directly admitted  Interval events:  Received Magnesium, Potassium, Calcium Feels better, less tremor Tells me that he last took his lantus on Monday (skipped yesterday)   Temp:  [97.2 F (36.2 C)-98.2 F (36.8 C)] 98.2 F (36.8 C) (06/20 0500) Pulse Rate:  [83-89] 85  (06/20 0500) Resp:  [22] 22  (06/20 0500) BP: (111-134)/(66-74) 117/70 mmHg (06/20 0500) SpO2:  [93 %-100 %] 96 % (06/20 0500) FiO2 (%):  [100 %] 100 % (  06/19 1308) Weight:  [124.7 kg (274 lb 14.6 oz)] 124.7 kg (274 lb 14.6 oz) (06/19 1330)    Intake/Output Summary (Last 24 hours) at 04/24/12 1221 Last data filed at 04/24/12 0900  Gross per 24 hour  Intake    360 ml  Output    601 ml  Net   -241 ml   Physical exam  BP 117/70  Pulse 85  Temp 98.2 F (36.8 C) (Oral)  Resp 22  Ht 5\' 11"  (1.803 m)  Wt 124.7 kg (274 lb 14.6 oz)  BMI 38.34 kg/m2  SpO2 96%  General Appearance:  Obese. Looks more deconditioned. On oxygen  Head:    Normocephalic, without obvious abnormality, atraumatic  Eyes:    PERRL, conjunctiva/corneas  clear, EOM's intact, fundi    benign, both eyes       Ears:    Normal TM's and external ear canals, both ears  Nose:   Nares normal, septum midline, mucosa normal, no drainage   or sinus tenderness  Throat:   Lips, mucosa, and tongue normal; teeth and gums normal. CLass 3 Mallampatti class  Neck:   Supple, symmetrical, trachea midline, no adenopathy;       thyroid:  No enlargement/tenderness/nodules; no carotid   bruit or JVD  Back:     Symmetric, no curvature, ROM normal, no CVA tenderness  Lungs:      respirations unlabored but does have purse lip breathing +. Some wheeze esp in right upper lobe +  Chest wall:    No tenderness or deformity  Heart:    Regular rate and rhythm, S1 and S2 normal, no murmur, rub   or gallop  Abdomen:     Soft, non-tender, bowel sounds active all four quadrants,    no masses, no organomegaly  Genitalia:    Normal male without lesion, discharge or tenderness  Rectal:    Normal tone, normal prostate, no masses or tenderness;   guaiac negative stool  Extremities:   Extremities normal, atraumatic, no cyanosis or edema. SIGNIFICANT TREMORS in HANDS +  Pulses:   2+ and symmetric all extremities  Skin:   Skin color, texture, turgor normal, no rashes or lesions  Lymph nodes:   Cervical, supraclavicular, and axillary nodes normal  Neurologic:   CNII-XII intact. Normal strength, sensation and reflexes      throughout    radiology Vent Mode:  [-]  FiO2 (%):  [100 %] 100 % Dg Chest 2 View  04/22/2012  *RADIOLOGY REPORT*  Clinical Data: History of thoracotomy and right lobectomy, follow- up  CHEST - 2 VIEW  Comparison: Portable chest x-ray of 04/02/2012  Findings: Aeration of the lungs has improved somewhat.  Pleural and parenchymal opacities remain particularly at the right lung base and  in the periphery of the right upper hemithorax.  No effusion is seen.  Cardiomegaly is stable.  No acute bony abnormality is noted.  IMPRESSION: Improved aeration.  Persistent  pleural and parenchymal opacities on the right postoperatively.  Original Report Authenticated By: Juline Patch, M.D.    LAB RESULT Lab Results  Component Value Date   CREATININE 1.02 04/24/2012   BUN 11 04/24/2012   NA 141 04/24/2012   K 3.4* 04/24/2012   CL 106 04/24/2012   CO2 21 04/24/2012   Lab Results  Component Value Date   WBC 10.7* 04/24/2012   HGB 8.7* 04/24/2012   HCT 26.0* 04/24/2012   MCV 86.7 04/24/2012   PLT 255 04/24/2012   Lab Results  Component Value Date   ALT 34 03/19/2012   AST 79* 03/19/2012   ALKPHOS 66 03/19/2012   BILITOT 0.6 03/19/2012   Lab Results  Component Value Date   INR 1.00 03/13/2012   INR 0.91 11/13/2011   INR 1.0 12/20/2008    Lab 04/24/12 0515 04/23/12 0938  NA 141 140  K 3.4* 2.9*  CL 106 104  CO2 21 25  GLUCOSE 112* 111*  BUN 11 13  CREATININE 1.02 1.2  CALCIUM 6.5* 5.8*  MG 1.1* 0.4*  PHOS 3.6 3.7   Patient Active Hospital Problem List: Hypomagnesemia (04/23/2012)   Assessment: Improving   Plan: 2g today and repeat am  Hypokalemia (09/20/2011)   Assessment: Improving   Plan: replete again today  Hypocalcemia (04/23/2012)   Assessment: improving   Plan: Replete and recheck  C O P D  With chronic resp failure(08/22/2007)   Assessment: Appears stable. Though deconditioned   Plan: Baseline nebs, get pt invlved, check spirometry  Hypertension ()   Assessment: Under control but he does not like the cardizem 4 time sdaily   Plan: Change to bid or once day regimen  DM type 2 (diabetes mellitus, type 2) ()   Assessment: CBG's in low 100's and he did not take his lantus 6/19. ? Whether some of his tremor was due to relative hypoglycemia   Plan: SSI; will cut lantus in half to 50units, follow CBG's before increasing  Depression ()   Assessment: ? More depressed since surgery   Plan: Will administer depression screen when metabolically better  Sleep apnea ()   Assessment: Baseline   Plan: use home cpap  Anemia)   Assessment:  Anemia of critical illlness. Hgb 9s and slowly better   Plan: - PRBC for hgb </= 6.9gm%    - exceptions are   -  if ACS susepcted/confirmed then transfuse for hgb </= 8.0gm%,  or    -  If septic shock first 24h and scvo2 < 70% then transfuse for hgb </= 9.0gm%   - active bleeding with hemodynamic instability, then transfuse regardless of hemoglobin value   At at all times try to transfuse 1 unit prbc as possible with exception of active hemorrhage   NEUROPATHIC PAIN  Assessment: relapsed after stopping neurotnin in setting of renal failure severl weeks ago  PLAn: restart neurontin now that creat is normal  CIGARETTE SMOKER (09/25/2007)   Assessment: Quit May 2013 at surgery   Plan: continue remission  Non-small cell carcinoma of lung, stage 1 (03/17/2012)   Assessment: Nil acute   Plan: No chemo plans  Acute renal failure (03/22/2012)   Assessment: resolved on 04/23/2012   Plan: monitor  S/P lobectomy of lung (04/04/2012)   Assessment: Healing wound +. No pain   Plan: Inform Dr Laneta Simmers of admit via epic routing   Levy Pupa, MD, PhD 04/24/2012, 12:32 PM [facility] [phone] or if no answer [phone]

## 2012-04-24 NOTE — Progress Notes (Signed)
UR Completed Evann Koelzer Graves-Bigelow, RN,BSN [phone]  

## 2012-04-25 LAB — BASIC METABOLIC PANEL
BUN: 12 mg/dL (ref 6–23)
Calcium: 6.9 mg/dL — ABNORMAL LOW (ref 8.4–10.5)
Chloride: 105 mEq/L (ref 96–112)
Creatinine, Ser: 0.93 mg/dL (ref 0.50–1.35)
GFR calc Af Amer: >90 mL/min (ref >90)

## 2012-04-25 LAB — GLUCOSE, CAPILLARY: Glucose-Capillary: 136 mg/dL — ABNORMAL HIGH (ref 70–99)

## 2012-04-25 MED ORDER — DILTIAZEM HCL ER COATED BEADS 120 MG PO CP24
120.0000 mg | ORAL_CAPSULE | Freq: Every day | ORAL | Status: DC
  Administered 2012-04-25: 120 mg via ORAL
  Filled 2012-04-25: qty 1

## 2012-04-25 MED ORDER — CALCIUM CARBONATE 1250 (500 CA) MG PO TABS: 1.0000 | ORAL_TABLET | Freq: Every day | ORAL | Status: DC

## 2012-04-25 MED ORDER — BECLOMETHASONE DIPROPIONATE 80 MCG/ACT IN AERS: 2.0000 | INHALATION_SPRAY | Freq: Two times a day (BID) | RESPIRATORY_TRACT | Status: DC

## 2012-04-25 MED ORDER — LEVALBUTEROL HCL 0.63 MG/3ML IN NEBU: INHALATION_SOLUTION | RESPIRATORY_TRACT | Status: DC

## 2012-04-25 MED ORDER — DILTIAZEM HCL ER COATED BEADS 120 MG PO CP24: 120.0000 mg | ORAL_CAPSULE | Freq: Every day | ORAL | Status: DC

## 2012-04-25 MED ORDER — GABAPENTIN 300 MG PO CAPS: 300.0000 mg | ORAL_CAPSULE | Freq: Three times a day (TID) | ORAL | Status: DC

## 2012-04-25 NOTE — Progress Notes (Signed)
  Subjective:    Patient ID: [name], male    DOB: 06-02-1951, 61 y.o.   MRN: [id number]  HPIadmotted    Review of Systems     Objective:   Physical Exam        Assessment & Plan:

## 2012-04-25 NOTE — Discharge Summary (Signed)
Physician Discharge Summary  Patient ID: [name] MRN: [id number] DOB/AGE: Apr 10, 1951 61 y.o.  Admit date: 04/23/2012 Discharge date: 04/25/2012  Problem List Principal Problem:  *Hypomagnesemia Active Problems:  CIGARETTE SMOKER  C O P D  Non-small cell carcinoma of lung, stage 1  Hypertension  DM type 2 (diabetes mellitus, type 2)  Depression  Sleep apnea  Anemia  Hypokalemia  Acute renal failure  S/P lobectomy of lung  Hypocalcemia  Neuropathic pain of both legs  HPI: Post lobectomy May 2013. Complicated ICU course with leak and ATN. Seen on pulmonary clinic on 04/23/2012  With multilple complaints and found to have profound depletion in calcium, potassium and magnesium and admitted   61 year old male. Dischargted from hospital > 2 weeks ago on 03/17/12 following RUL lobectomy for stage 1A NSCLC and prolonged 2 week hospital course (see above) complicated by ATN (never got cvvh), delirium and air Leak. Returns 04/23/2012 for followup with wife. Smoking still in remission. Multiple complaints   Hospital Course: - tremors new x 1 week following starting new symbicort  - return of lower extremity neuropathic pain following dc of his home neurontin at prior admission due to renal failure  - smoking still in remission; no longer angry man but more docile  - intense fatigue, that is of insidious onset and progressive and constant and associtaed with class 3 dyapnea  - denies ae-copd symptoms like increase in cough, wheeze, sputum, fever  - wife has lot of questions on medications - wants to dc thiamine and folic acid and wants him on simpler cardizem regimen  - they both say CXR yesterday at Dr Laneta Simmers office shows improvement and creatinine last week with Dr Hyman Hopes was down to 2mg %. So, they are puzzled about her symptoms  Post office visit labs showed normalization of creatinine but severe life threatening depletions in potassium, magnesium and calcium. So, directly admitted    Interval events:  Received Magnesium, Potassium, Calcium  Feels better, less tremor  Tells me that he last took his lantus on Monday (skipped Tuesday)  Temp: [97.2 F (36.2 C)-98.2 F (36.8 C)] 98.2 F (36.8 C) (06/20 0500)  Pulse Rate: [83-89] 85 (06/20 0500)  Resp: [22] 22 (06/20 0500)  BP: (111-134)/(66-74) 117/70 mmHg (06/20 0500)  SpO2: [93 %-100 %] 96 % (06/20 0500)  FiO2 (%): [100 %] 100 % (06/19 1308)  Weight: [124.7 kg (274 lb 14.6 oz)] 124.7 kg (274 lb 14.6 oz) (06/19 1330)    Intake/Output Summary (Last 24 hours) at 04/24/12 1221 Last data filed at 04/24/12 0900   Gross per 24 hour   Intake  360 ml   Output  601 ml   Net  -241 ml    Physical exam  BP 117/70  Pulse 85  Temp 98.2 F (36.8 C) (Oral)  Resp 22  Ht 5\' 11"  (1.803 m)  Wt 124.7 kg (274 lb 14.6 oz)  BMI 38.34 kg/m2  SpO2 96%  General Appearance:  Obese. Looks more deconditioned. On oxygen   Head:  Normocephalic, without obvious abnormality, atraumatic   Eyes:  PERRL, conjunctiva/corneas clear, EOM's intact, fundi  benign, both eyes   Ears:  Normal TM's and external ear canals, both ears   Nose:  Nares normal, septum midline, mucosa normal, no drainage  or sinus tenderness   Throat:  Lips, mucosa, and tongue normal; teeth and gums normal. CLass 3 Mallampatti class   Neck:  Supple, symmetrical, trachea midline, no adenopathy;  thyroid: No enlargement/tenderness/nodules; no  carotid  bruit or JVD   Back:  Symmetric, no curvature, ROM normal, no CVA tenderness   Lungs:  respirations unlabored but does have purse lip breathing +. Some wheeze esp in right upper lobe +   Chest wall:  No tenderness or deformity   Heart:  Regular rate and rhythm, S1 and S2 normal, no murmur, rub  or gallop   Abdomen:  Soft, non-tender, bowel sounds active all four quadrants,  no masses, no organomegaly   Genitalia:  Normal male without lesion, discharge or tenderness   Rectal:  Normal tone, normal prostate, no masses or  tenderness;  guaiac negative stool   Extremities:  Extremities normal, atraumatic, no cyanosis or edema. SIGNIFICANT TREMORS in HANDS +   Pulses:  2+ and symmetric all extremities   Skin:  Skin color, texture, turgor normal, no rashes or lesions   Lymph nodes:  Cervical, supraclavicular, and axillary nodes normal   Neurologic:  CNII-XII intact. Normal strength, sensation and reflexes  throughout   radiology  Vent Mode: [-]  FiO2 (%): [100 %] 100 %  Dg Chest 2 View  04/22/2012 *RADIOLOGY REPORT* Clinical Data: History of thoracotomy and right lobectomy, follow- up CHEST - 2 VIEW Comparison: Portable chest x-ray of 04/02/2012 Findings: Aeration of the lungs has improved somewhat. Pleural and parenchymal opacities remain particularly at the right lung base and in the periphery of the right upper hemithorax. No effusion is seen. Cardiomegaly is stable. No acute bony abnormality is noted. IMPRESSION: Improved aeration. Persistent pleural and parenchymal opacities on the right postoperatively. Original Report Authenticated By: Juline Patch, M.D.   LAB RESULT  Lab Results   Component  Value  Date    CREATININE  1.02  04/24/2012    BUN  11  04/24/2012    NA  141  04/24/2012    K  3.4*  04/24/2012    CL  106  04/24/2012    CO2  21  04/24/2012    Lab Results   Component  Value  Date    WBC  10.7*  04/24/2012    HGB  8.7*  04/24/2012    HCT  26.0*  04/24/2012    MCV  86.7  04/24/2012    PLT  255  04/24/2012    Lab Results   Component  Value  Date    ALT  34  03/19/2012    AST  79*  03/19/2012    ALKPHOS  66  03/19/2012    BILITOT  0.6  03/19/2012    Lab Results   Component  Value  Date    INR  1.00  03/13/2012    INR  0.91  11/13/2011    INR  1.0  12/20/2008     Lab  04/24/12 0515  04/23/12 0938   NA  141  140   K  3.4*  2.9*   CL  106  104   CO2  21  25   GLUCOSE  112*  111*   BUN  11  13   CREATININE  1.02  1.2   CALCIUM  6.5*  5.8*   MG  1.1*  0.4*   PHOS  3.6  3.7    Patient Active  Hospital Problem List: Hypomagnesemia (04/23/2012) Assessment: Improving Plan: 2g today and repeat am  Hypokalemia (09/20/2011) Assessment: Improving Plan: replete again today  Hypocalcemia (04/23/2012) Assessment: improving Plan: Replete and recheck  C O P D With chronic resp failure(08/22/2007) Assessment: Appears stable.  Though deconditioned Plan: Baseline nebs, get pt involved, check spirometry  Hypertension () Assessment: Under control but he does not like the cardizem 4 time sdaily Plan: Change to bid or once day regimen  DM type 2 (diabetes mellitus, type 2) ()  Lab 04/25/12 0757 04/24/12 2102 04/24/12 1618 04/24/12 1136 04/24/12 0757  GLUCAP 136* 173* 137* 111* 91  Assessment: CBG's in low 100's and he did not take his lantus 6/19. ? Whether some of his tremor was due to relative hypoglycemia Plan: SSI; cut lantus in half to 50 units on 6/20, plan increase back to his usual dosing at discharge  Depression () Assessment: ? More depressed since surgery Plan: Will administer depression screen when metabolically better  Sleep apnea () Assessment: Baseline Plan: use home cpap  Anemia) Assessment: Anemia of critical illlness. Hgb 9s and slowly better Plan: - PRBC for hgb </= 6.9gm%  - exceptions are  - if ACS susepcted/confirmed then transfuse for hgb </= 8.0gm%, or  - If septic shock first 24h and scvo2 < 70% then transfuse for hgb </= 9.0gm%  - active bleeding with hemodynamic instability, then transfuse regardless of hemoglobin value  At at all times try to transfuse 1 unit prbc as possible with exception of active hemorrhage  NEUROPATHIC PAIN  Assessment: relapsed after stopping neurotnin in setting of renal failure severl weeks ago  PLAn: restart neurontin now that creat is normal   CIGARETTE SMOKER (09/25/2007) Assessment: Quit May 2013 at surgery Plan: continue remission  Non-small cell carcinoma of lung, stage 1 (03/17/2012) Assessment: Nil acute Plan: No  chemo plans  Acute renal failure (03/22/2012) Assessment: resolved on 04/23/2012  Plan: monitor  S/P lobectomy of lung (04/04/2012) Assessment: Healing wound +. No pain Plan: Inform Dr Laneta Simmers of admit via epic routing    Labs at discharge Lab Results  Component Value Date   CREATININE 0.93 04/25/2012   BUN 12 04/25/2012   NA 138 04/25/2012   K 3.9 04/25/2012   CL 105 04/25/2012   CO2 22 04/25/2012   Lab Results  Component Value Date   WBC 10.7* 04/24/2012   HGB 8.7* 04/24/2012   HCT 26.0* 04/24/2012   MCV 86.7 04/24/2012   PLT 255 04/24/2012   Lab Results  Component Value Date   ALT 34 03/19/2012   AST 79* 03/19/2012   ALKPHOS 66 03/19/2012   BILITOT 0.6 03/19/2012   Lab Results  Component Value Date   INR 1.00 03/13/2012   INR 0.91 11/13/2011   INR 1.0 12/20/2008    Current radiology studies No results found.  Disposition:  01-Home or Self Care  Discharge Orders    Future Appointments: Provider: Department: Dept Phone: Center:   05/07/2012 9:00 AM Julio Sicks, NP [facility] [phone] None   07/22/2012 12:00 PM Alleen Borne, MD [facility] [phone] [id number]     Medication List  As of 04/25/2012  9:53 AM   STOP taking these medications         acetaminophen 500 MG tablet      diltiazem 30 MG tablet         TAKE these medications         aspirin EC 81 MG tablet   Take 81 mg by mouth daily.      beclomethasone 80 MCG/ACT inhaler   Commonly known as: QVAR   Inhale 2 puffs into the lungs 2 (two) times daily.      calcium carbonate 1250 MG tablet   Commonly  known as: OS-CAL - dosed in mg of elemental calcium   Take 1 tablet (500 mg of elemental calcium total) by mouth daily.      diltiazem 120 MG 24 hr capsule   Commonly known as: CARDIZEM CD   Take 1 capsule (120 mg total) by mouth daily.      FLUoxetine 40 MG capsule   Commonly known as: PROZAC   Take 40 mg by mouth daily.      gabapentin 300 MG capsule   Commonly known as: NEURONTIN   Take 1  capsule (300 mg total) by mouth 3 (three) times daily.      insulin aspart 100 UNIT/ML injection   Commonly known as: novoLOG   Inject 30 Units into the skin 3 (three) times daily before meals.      insulin glargine 100 UNIT/ML injection   Commonly known as: LANTUS   Inject 100 Units into the skin every morning.      levalbuterol 0.63 MG/3ML nebulizer solution   Commonly known as: XOPENEX   1 vial 4 times a day dx 496      levalbuterol 0.63 MG/3ML nebulizer solution   Commonly known as: XOPENEX   Take 1 ampule by nebulization every 4 (four) hours as needed. For shortness of breath      multivitamin with minerals Tabs   Take 1 tablet by mouth daily.      naphazoline-pheniramine 0.025-0.3 % ophthalmic solution   Commonly known as: NAPHCON-A   Place 1 drop into both eyes daily as needed. For dry eyes      omeprazole 20 MG capsule   Commonly known as: PRILOSEC   Take 20 mg by mouth daily.      tiotropium 18 MCG inhalation capsule   Commonly known as: SPIRIVA   Place 18 mcg into inhaler and inhale daily.      traZODone 100 MG tablet   Commonly known as: DESYREL   Take 100 mg by mouth at bedtime.             Discharged Condition: good Vital signs at Discharge. Temp:  [97.8 F (36.6 C)-98.2 F (36.8 C)] 97.8 F (36.6 C) (06/21 0500) Pulse Rate:  [82-96] 91  (06/21 0500) Resp:  [20] 20  (06/21 0500) BP: (107-145)/(35-76) 107/35 mmHg (06/21 0500) SpO2:  [92 %-98 %] 97 % (06/21 0836) Weight:  [275 lb (124.739 kg)] 275 lb (124.739 kg) (06/21 0500) Office follow up Special Information or instructions. 1. Follow up with T. Parrett with a bmet. 2. Diltiazem changed to QD dosing. 3. Please note his lantus was temporarily decreased while admitted, but he was discharge back on regular dose of 100 units qd. Assess that he has tolerated the increase Signed: Brett Canales Minor ACNP [facility] Pager [phone] till 3 pm If no answer page [phone] 04/25/2012, 9:53 AM  Levy Pupa, MD, PhD 04/25/2012, 11:08 AM [facility] [phone] or if no answer [phone]

## 2012-04-25 NOTE — Progress Notes (Signed)
Pt provided with d/c instructions and education. Pt verbalized understanding and teach back education. Pt has no questions and is ready to go home. Pt demonstrated use of home o2 and is leaving wearing his home o2 tank. IV removed with tip intact. Heart monitor cleaned and returned to front. No needs or questions at this time. Ramond Craver, RN

## 2012-05-01 ENCOUNTER — Ambulatory Visit (INDEPENDENT_AMBULATORY_CARE_PROVIDER_SITE_OTHER): Payer: Managed Care, Other (non HMO) | Admitting: Adult Health

## 2012-05-01 ENCOUNTER — Encounter: Payer: Self-pay | Admitting: Adult Health

## 2012-05-01 ENCOUNTER — Other Ambulatory Visit: Payer: Self-pay | Admitting: Adult Health

## 2012-05-01 VITALS — BP 140/70 | HR 110 | Temp 97.1°F | Ht 71.0 in | Wt 278.2 lb

## 2012-05-01 DIAGNOSIS — J449 Chronic obstructive pulmonary disease, unspecified: Secondary | ICD-10-CM

## 2012-05-01 DIAGNOSIS — E876 Hypokalemia: Secondary | ICD-10-CM

## 2012-05-01 DIAGNOSIS — C3490 Malignant neoplasm of unspecified part of unspecified bronchus or lung: Secondary | ICD-10-CM

## 2012-05-01 DIAGNOSIS — N179 Acute kidney failure, unspecified: Secondary | ICD-10-CM

## 2012-05-01 LAB — BASIC METABOLIC PANEL
BUN: 15 mg/dL (ref 6–23)
CO2: 26 mEq/L (ref 19–32)
Calcium: 8.4 mg/dL (ref 8.4–10.5)
Chloride: 104 mEq/L (ref 96–112)
Creatinine, Ser: 1.2 mg/dL (ref 0.4–1.5)
Glucose, Bld: 113 mg/dL — ABNORMAL HIGH (ref 70–99)

## 2012-05-01 MED ORDER — POTASSIUM CHLORIDE CRYS ER 20 MEQ PO TBCR: 20.0000 meq | EXTENDED_RELEASE_TABLET | Freq: Every day | ORAL | Status: DC

## 2012-05-01 NOTE — Assessment & Plan Note (Signed)
Compensated on present regimen.  Encouraged on smoking cesstation

## 2012-05-01 NOTE — Patient Instructions (Addendum)
Continue on current regimen. Work on stopping smoking.  Follow up Dr. Marchelle Gearing in 4 weeks and As needed   Potassium was slightly low - eat more potassium rich foods.  Begin KDur 20eq daily  Follow your Potassium levels at your family doctor.  Please contact office for sooner follow up if symptoms do not improve or worsen or seek emergency care

## 2012-05-01 NOTE — Assessment & Plan Note (Signed)
S/p RUL lobectomy  follow up Dr. Marchelle Gearing in 4 weeks and As needed

## 2012-05-01 NOTE — Assessment & Plan Note (Signed)
Resolved Returned back to his chronic renal insufficiency baseline.

## 2012-05-01 NOTE — Progress Notes (Signed)
Subjective:    Patient ID: [name], male    DOB: 1950-12-24, 61 y.o.   MRN: [id number]  HPI HPIProblem List 1. tobacco abuse (wellbutrin intolerance hx - insomnia in early 2011, chantix intolerance )  - quit Nov 2012 after admission for pna - relapsed Jan/FEb 2013  2A. Copd nos  -bullous emphysema on CT and isolated low DLCO on PFT 2008; did not desaturate Jan and FEb 2013  -  PFT Feb 2013: 01/02/12: fvc 2.8L/57%, fev1 2.2L/59%, Ratio 78 (104%), 14% BD response, small airways 64%, TLC 95%, DLCO 18.7/55%  - spirometry March 2013: fev1 2.Marland Kitchen4L/63%, Ratio 93 - suggestive of restriction  2B. AECOPD - Nov 2012 admit for pneumonia  - Jan 2013 OPD Rx  - March 2013 - presumed opd Rx  3. Mediastinal nodes Stable Oct 2009 - July 2011; no further fu  4.RUL pulmonary nodule  - 5mm July 2011 -> sept 2012,  unchanged  - new 1.4 cm cavity and 9mm nodule RUL - Feb 2013, PEt Indeterminate and Oncimmune serum antigen 7 panel - negative  - March 2013 -   5. Obesity/osa on cpap and cough  Body mass index is 42.40 kg/(m^2). on 12/21/2011 Body mass index is 42.40 kg/(m^2). on 12/21/2011  6. RLL pulmonary scarring  Stable CT 2008 -> 2011; no further fu  7. CAD - s/p stent  OV 02/28/2012 Followup after repeat CT chest. No new symptoms. Stil dyspneic class 2-3. Denies AECOPD symptoms though he seemed to suggest that to the CMA during intake. Spirometrytoday fev1 2.4L/63%.  Super D Ct chest today shows increase in RUL 1.4 cm cavity to 1.5cm but no change in adjacent in the 9mm though I think this is denser. In addition, there are non-specific scattered ggo in the right lower lobe. In terms of smoking: not quitting. Struggling. Does not want help. 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  Unfortunately readmitted 6/19-/21 for weakness found to have  hypomagnesemia ,  hypokalemic  Complicated by ATN/worsening renal failure  Tx w/ electrolyte replacement.  Today labs show scr stable at 1.2  And K+ slightly low at 3.4.  KDur -added back  and instructions to follow up at PCP for further monitoring.  Last cxr with improved aeration on 04/22/12 at post op follow up with Dr. Laneta Simmers  He is feeling much better since discharge w/ decreased dypsnea.  Energy level is slowly returning.     Review of Systems Constitutional:   No  weight loss, night sweats,  Fevers, chills, ++fatigue, or  lassitude.  HEENT:   No headaches,  Difficulty swallowing,  Tooth/dental problems, or  Sore throat,                No sneezing, itching, ear ache, nasal congestion, post nasal drip,   CV:  No chest pain,  Orthopnea, PND, swelling in lower extremities, anasarca, dizziness, palpitations, syncope.   GI  No heartburn, indigestion, abdominal pain, nausea, vomiting, diarrhea, change in bowel habits, loss of appetite, bloody stools.   Resp:    No coughing up of blood.    No chest wall deformity  Skin: no rash or lesions.  GU: no dysuria, change in color of urine, no urgency or frequency.  No flank pain, no hematuria   MS:  No joint pain or swelling.  No decreased range of motion.  No back  pain.  Psych:  No change in mood or affect. No depression or anxiety.  No memory loss.         Objective:   Physical Exam GEN: A/Ox3; pleasant , NAD, obese   HEENT:  [place]/AT,  EACs-clear, TMs-wnl, NOSE-clear, THROAT-clear, no lesions, no postnasal drip or exudate noted.   NECK:  Supple w/ fair ROM; no JVD; normal carotid impulses w/o bruits; no thyromegaly or nodules palpated; no lymphadenopathy.  RESP  Coarse BS no accessory muscle use, no dullness to percussion Well healed surgical sites along right chest wall.   CARD:  RRR, no m/r/g  , no peripheral edema, pulses intact, no cyanosis or clubbing.  GI:   Soft & nt; nml bowel sounds; no organomegaly or masses detected.  Musco: Warm  bil, no deformities or joint swelling noted.   Neuro: alert, no focal deficits noted.    Skin: Warm, no lesions or rashes         Assessment & Plan:

## 2012-05-01 NOTE — Assessment & Plan Note (Signed)
Replace K+  follow up pcp for further monitoring.

## 2012-05-02 ENCOUNTER — Telehealth: Payer: Self-pay | Admitting: Internal Medicine

## 2012-05-02 DIAGNOSIS — R790 Abnormal level of blood mineral: Secondary | ICD-10-CM

## 2012-05-02 DIAGNOSIS — N19 Unspecified kidney failure: Secondary | ICD-10-CM

## 2012-05-02 LAB — MAGNESIUM: Magnesium: 0.6 mg/dL — CL (ref 1.5–2.5)

## 2012-05-02 MED ORDER — MAGNESIUM OXIDE 400 MG PO TABS: 400.0000 mg | ORAL_TABLET | Freq: Two times a day (BID) | ORAL | Status: DC

## 2012-05-02 NOTE — Telephone Encounter (Signed)
Called lab, spoke with [place].  She did receive this request.  States they will only be able to attach the labs to original order which will be sent to who ever ordered the original labs.  Therefore, because TP ordered the BMET, Archie Patten will call up here today when these results are available so that we can inform MR.  Will hold in triage to follow up on.

## 2012-05-02 NOTE — Telephone Encounter (Signed)
D/w Dr Hyman Hopes  Of renal  Magnesium oxide 400mg  twice daily  X 2 weeks, check bmet, phos, mag on 05/06/12   If feels extra weak during weekend, call us or go to ER  wE think this is still due to slowly healing kidney

## 2012-05-02 NOTE — Patient Instructions (Signed)
Started on mag oxide per Dr. Marchelle Gearing

## 2012-05-02 NOTE — Telephone Encounter (Signed)
Tammy Parrett saw this patient with labs yesgterday but no mag or phos was done. Can you call lab and add this please with results to me ? If not, let me know  Thanks  MR

## 2012-05-02 NOTE — Telephone Encounter (Signed)
Received Call report on Mag level from [facility] in the lab -- critical low at 0.58.   Phos = 3.5 Results in epic.    Have paged MR to address this via [place] [employer] and will send msg to his box.

## 2012-05-02 NOTE — Telephone Encounter (Signed)
Spoke with pt and notified of results/recs per MR. Pt verbalized understanding and denied any questions. States will come to have f/u labs on 05-06-12. Order was placed.

## 2012-05-02 NOTE — Telephone Encounter (Signed)
Request to Add/Credit Lab tests form completed and faxed to lab at [phone].  Will call back shortly to make sure they received this request.  Will hold in triage until this.

## 2012-05-02 NOTE — Assessment & Plan Note (Signed)
Replaced per Dr. Marchelle Gearing

## 2012-05-02 NOTE — Progress Notes (Signed)
Patient ID: IBRAHEEM VORIS, male   DOB: January 13, 1951, 61 y.o.   MRN: [id number] Lab results note

## 2012-05-07 ENCOUNTER — Encounter: Payer: Managed Care, Other (non HMO) | Admitting: Adult Health

## 2012-05-09 ENCOUNTER — Emergency Department (HOSPITAL_COMMUNITY): Payer: Managed Care, Other (non HMO)

## 2012-05-09 ENCOUNTER — Encounter (HOSPITAL_COMMUNITY): Payer: Self-pay

## 2012-05-09 ENCOUNTER — Emergency Department (HOSPITAL_COMMUNITY)
Admission: EM | Admit: 2012-05-09 | Discharge: 2012-05-09 | Disposition: A | Discharge status: Home / Self Care | Payer: Managed Care, Other (non HMO) | Attending: Emergency Medicine | Admitting: Emergency Medicine

## 2012-05-09 DIAGNOSIS — R5381 Other malaise: Secondary | ICD-10-CM | POA: Insufficient documentation

## 2012-05-09 DIAGNOSIS — E162 Hypoglycemia, unspecified: Secondary | ICD-10-CM

## 2012-05-09 DIAGNOSIS — R531 Weakness: Secondary | ICD-10-CM

## 2012-05-09 DIAGNOSIS — R55 Syncope and collapse: Secondary | ICD-10-CM | POA: Insufficient documentation

## 2012-05-09 DIAGNOSIS — J449 Chronic obstructive pulmonary disease, unspecified: Secondary | ICD-10-CM | POA: Insufficient documentation

## 2012-05-09 DIAGNOSIS — J4489 Other specified chronic obstructive pulmonary disease: Secondary | ICD-10-CM | POA: Insufficient documentation

## 2012-05-09 DIAGNOSIS — I2510 Atherosclerotic heart disease of native coronary artery without angina pectoris: Secondary | ICD-10-CM | POA: Insufficient documentation

## 2012-05-09 DIAGNOSIS — R5383 Other fatigue: Secondary | ICD-10-CM | POA: Insufficient documentation

## 2012-05-09 DIAGNOSIS — E1169 Type 2 diabetes mellitus with other specified complication: Secondary | ICD-10-CM | POA: Insufficient documentation

## 2012-05-09 DIAGNOSIS — Z79899 Other long term (current) drug therapy: Secondary | ICD-10-CM | POA: Insufficient documentation

## 2012-05-09 DIAGNOSIS — R0602 Shortness of breath: Secondary | ICD-10-CM | POA: Insufficient documentation

## 2012-05-09 DIAGNOSIS — E079 Disorder of thyroid, unspecified: Secondary | ICD-10-CM | POA: Insufficient documentation

## 2012-05-09 DIAGNOSIS — I10 Essential (primary) hypertension: Secondary | ICD-10-CM | POA: Insufficient documentation

## 2012-05-09 DIAGNOSIS — Z794 Long term (current) use of insulin: Secondary | ICD-10-CM | POA: Insufficient documentation

## 2012-05-09 DIAGNOSIS — I252 Old myocardial infarction: Secondary | ICD-10-CM | POA: Insufficient documentation

## 2012-05-09 DIAGNOSIS — E860 Dehydration: Secondary | ICD-10-CM

## 2012-05-09 DIAGNOSIS — Z7982 Long term (current) use of aspirin: Secondary | ICD-10-CM | POA: Insufficient documentation

## 2012-05-09 LAB — CBC
HCT: 32.4 % — ABNORMAL LOW (ref 39.0–52.0)
Hemoglobin: 10.6 g/dL — ABNORMAL LOW (ref 13.0–17.0)
MCH: 28.4 pg (ref 26.0–34.0)
MCHC: 32.7 g/dL (ref 30.0–36.0)
RBC: 3.73 MIL/uL — ABNORMAL LOW (ref 4.22–5.81)

## 2012-05-09 LAB — LIPASE, BLOOD: Lipase: 18 U/L (ref 11–59)

## 2012-05-09 LAB — POCT I-STAT TROPONIN I: Troponin i, poc: 0 ng/mL (ref 0.00–0.08)

## 2012-05-09 LAB — COMPREHENSIVE METABOLIC PANEL
ALT: 11 U/L (ref 0–53)
Alkaline Phosphatase: 74 U/L (ref 39–117)
BUN: 18 mg/dL (ref 6–23)
CO2: 25 mEq/L (ref 19–32)
Calcium: 10.6 mg/dL — ABNORMAL HIGH (ref 8.4–10.5)
GFR calc Af Amer: 89 mL/min — ABNORMAL LOW (ref >90)
GFR calc non Af Amer: 77 mL/min — ABNORMAL LOW (ref >90)
Glucose, Bld: 110 mg/dL — ABNORMAL HIGH (ref 70–99)
Sodium: 137 mEq/L (ref 135–145)

## 2012-05-09 LAB — URINALYSIS, ROUTINE W REFLEX MICROSCOPIC
Bilirubin Urine: NEGATIVE
Hgb urine dipstick: NEGATIVE
Nitrite: NEGATIVE
Protein, ur: NEGATIVE mg/dL
Urobilinogen, UA: 0.2 mg/dL (ref 0.0–1.0)

## 2012-05-09 LAB — GLUCOSE, CAPILLARY

## 2012-05-09 MED ORDER — IPRATROPIUM BROMIDE 0.02 % IN SOLN
0.5000 mg | Freq: Once | RESPIRATORY_TRACT | Status: AC
  Administered 2012-05-09: 0.5 mg via RESPIRATORY_TRACT
  Filled 2012-05-09: qty 2.5

## 2012-05-09 MED ORDER — ALBUTEROL SULFATE (5 MG/ML) 0.5% IN NEBU
5.0000 mg | INHALATION_SOLUTION | Freq: Once | RESPIRATORY_TRACT | Status: AC
  Administered 2012-05-09: 5 mg via RESPIRATORY_TRACT
  Filled 2012-05-09: qty 1

## 2012-05-09 MED ORDER — SODIUM CHLORIDE 0.9 % IV BOLUS (SEPSIS)
1000.0000 mL | Freq: Once | INTRAVENOUS | Status: AC
  Administered 2012-05-09: 1000 mL via INTRAVENOUS

## 2012-05-09 MED ORDER — SODIUM CHLORIDE 0.9 % IV BOLUS (SEPSIS): 500.0000 mL | Freq: Once | INTRAVENOUS | Status: DC

## 2012-05-09 NOTE — ED Provider Notes (Signed)
History     CSN: [id number]  Arrival date & time 05/09/12  1302   First MD Initiated Contact with Patient 05/09/12 1359      Chief Complaint  Patient presents with  . Loss of Consciousness  . Hypoglycemia    (Consider location/radiation/quality/duration/timing/severity/associated sxs/prior treatment) The history is provided by the patient.   a 61 year old male with a past medical history her artery disease, COPD, stage I lung cancer with right upper lobe resection a month and a half ago, insulin-dependent diabetes the emergency department with a chief complaint of generalized weakness and unsteadiness for the last week intermittently. Outside and began to feel weak, fell to the ground without head injury and was unable to get up for several minutes. After he got up, he had a second episode similar to the first several minutes later. He called EMS and was to have a blood sugar of 41. Oral glucose was given with good response. He reports that his blood sugar has been labile since his surgery in May. Week, he reports that his generalized weakness has resulted in his legs giving out and he is brought to the ground several times. Denies any focal weakness. Denies any headache, visual change, trouble speaking or difficulty moving the extremities. He feels his breathing is worse than his baseline but denies any chest pain, cough, fever. His symptoms are generally worse after exerting himself. No known alleviating factors.  Past Medical History  Diagnosis Date  . Anemia   . Allergy   . Thyroid disease   . Parathyroid disease   . Headache   . Hypertension     takes  HYzaar daily  . Coronary artery disease     has 1 stent  . Myocardial infarct   . Asthma   . Emphysema     sees Dr.Ramaswami for this  . COPD (chronic obstructive pulmonary disease)     uses Albuterol and Spiriva daily  . Shortness of breath     with exertion   . Bronchitis   . Productive cough     white in color but no odor   . Sleep apnea     uses BiPaP  . Peripheral neuropathy   . Lung mass     right upper lobe  . Back pain     4 deteriorating disc and receives an injection q3-77mon;has been doing this for about 69yrs  . H/O hiatal hernia   . GERD (gastroesophageal reflux disease)     takes Prilosec daily  . Blood transfusion     as a child  . Glaucoma     hx of  . Depression     takes Prozac daily  . Insomnia     takes Trazodone nightly  . Anginal pain   . Chronic kidney disease     acute kidney failure post surgery  . Pneumonia     hx of' 61 yo,rd' last time in 2012  . Type II diabetes mellitus     takes Metformin bid and Novolog and Lantus daily  . Arthritis     "hands"    Past Surgical History  Procedure Date  . Carpal tunnel release     bilateral  . Rotator cuff repair     left  . Colonoscopy   . Esophagogastroduodenoscopy   . Eye surgery   . Lobectomy 03/17/2012    upper right side with resection  . Elbow surgery     ulnar nerve; left  . Cardiac catheterization 2006/2008/2009  .  Coronary angioplasty with stent placement     1 stent  . Refractive surgery     bilaterally  . Inguinal hernia repair 1990's    double inguinal    Family History  Problem Relation Age of Onset  . COPD Mother   . Heart attack Father   . Anesthesia problems Neg Hx   . Hypotension Neg Hx   . Malignant hyperthermia Neg Hx   . Pseudochol deficiency Neg Hx     History  Substance Use Topics  . Smoking status: Current Some Day Smoker -- 1.0 packs/day for 45 years    Types: Cigarettes    Last Attempt to Quit: 03/26/2012  . Smokeless tobacco: Never Used   Comment: reports still smoking 3 cigs a day 05-01-12  . Alcohol Use: Yes     04/23/12 quit 4months ago and was drinking a gallon a day      Review of Systems 10 systems reviewed and are negative for acute change except as noted in the HPI.  Allergies  Review of patient's allergies indicates no known allergies.  Home Medications   Current  Outpatient Rx  Name Route Sig Dispense Refill  . ASPIRIN EC 81 MG PO TBEC Oral Take 81 mg by mouth daily.    . BECLOMETHASONE DIPROPIONATE 80 MCG/ACT IN AERS Inhalation Inhale 2 puffs into the lungs 2 (two) times daily. 1 Inhaler 6  . CALCIUM CARBONATE 1250 MG PO TABS Oral Take 1 tablet (500 mg of elemental calcium total) by mouth daily. 30 tablet 1  . DILTIAZEM HCL ER COATED BEADS 120 MG PO CP24 Oral Take 1 capsule (120 mg total) by mouth daily. 30 capsule 1  . FLUOXETINE HCL 40 MG PO CAPS Oral Take 40 mg by mouth daily.      Marland Kitchen FOLIC ACID 1 MG PO TABS Oral Take 1 mg by mouth Daily.    Marland Kitchen GABAPENTIN 300 MG PO CAPS Oral Take 1 capsule (300 mg total) by mouth 3 (three) times daily. 90 capsule 1  . INSULIN ASPART 100 UNIT/ML [place] SOLN Subcutaneous Inject 10-25 Units into the skin 3 (three) times daily before meals. Home sliding scale; >140 = 10 units up to 25 units    . INSULIN GLARGINE 100 UNIT/ML [place] SOLN Subcutaneous Inject 80 Units into the skin every morning.     Marland Kitchen LEVALBUTEROL HCL 0.63 MG/3ML IN NEBU Nebulization Take 1 ampule by nebulization every 4 (four) hours as needed. For shortness of breath    . MAGNESIUM OXIDE 400 MG PO TABS Oral Take 400 mg by mouth daily.    . ADULT MULTIVITAMIN W/MINERALS CH Oral Take 1 tablet by mouth daily.    Marland Kitchen NAPHAZOLINE-PHENIRAMINE 0.025-0.3 % OP SOLN Both Eyes Place 1 drop into both eyes daily. For dry eyes    . OMEPRAZOLE 20 MG PO CPDR Oral Take 20 mg by mouth daily.      Marland Kitchen POTASSIUM CHLORIDE CRYS ER 20 MEQ PO TBCR Oral Take 20 mEq by mouth daily.    Marland Kitchen TIOTROPIUM BROMIDE MONOHYDRATE 18 MCG IN CAPS Inhalation Place 18 mcg into inhaler and inhale daily.      . TRAZODONE HCL 100 MG PO TABS Oral Take 100 mg by mouth at bedtime.        BP 120/68  Pulse 110  Temp 97.8 F (36.6 C) (Oral)  Resp 15  Ht 5\' 11"  (1.803 m)  Wt 278 lb (126.1 kg)  BMI 38.77 kg/m2  SpO2 95%  Physical Exam  Nursing note reviewed. Constitutional: He is oriented to person, place, and  time. He appears well-developed and well-nourished.       VS are reviewed and are significant for tachycardia. Patient is resting on the stretcher in no acute distress.  HENT:  Head: Normocephalic and atraumatic.  Right Ear: External ear normal.  Left Ear: External ear normal.       MMM  Eyes: Conjunctivae and EOM are normal. Pupils are equal, round, and reactive to light.       Visual fields full bilaterally  Neck: Neck supple.  Cardiovascular: Normal heart sounds.        Tachycardia, regular rhythm. Bilateral radial and DP pulses are 2+  Pulmonary/Chest:       Slightly increased WOB with faint wheezes heard in all lung fields. Speaking in complete sentences with no accessory muscle use. Decreased BS in right upper lung field.  Abdominal: Soft.       Obese, soft, nml bowel sounds with mild TTP to epigastric region and RUQ.  Musculoskeletal: He exhibits no edema and no tenderness.       Calves supple and non-tender.  Neurological: He is alert and oriented to person, place, and time. He has normal strength. No cranial nerve deficit (3-12 intact) or sensory deficit (intact to light touch in all extremities). Coordination (F-N intact bilaterally) normal. GCS eye subscore is 4. GCS verbal subscore is 5. GCS motor subscore is 6.  Skin: Skin is warm and dry. He is not diaphoretic. No pallor.  Psychiatric: He has a normal mood and affect.    ED Course  Procedures (including critical care time)  Labs Reviewed  GLUCOSE, CAPILLARY - Abnormal; Notable for the following:    Glucose-Capillary 110 (*)     All other components within normal limits  CBC - Abnormal; Notable for the following:    WBC 15.4 (*)     RBC 3.73 (*)     Hemoglobin 10.6 (*)     HCT 32.4 (*)     All other components within normal limits  GLUCOSE, CAPILLARY - Abnormal; Notable for the following:    Glucose-Capillary 105 (*)     All other components within normal limits  POCT I-STAT TROPONIN I  COMPREHENSIVE METABOLIC  PANEL  LIPASE, BLOOD  URINALYSIS, ROUTINE W REFLEX MICROSCOPIC   Dg Chest 2 View  05/09/2012  *RADIOLOGY REPORT*  Clinical Data: Shortness of breath and weakness.  CHEST - 2 VIEW  Comparison: 04/22/2012  Findings: Stable volume loss in the right hemithorax with asymmetric elevation of the right hemidiaphragm.  Patchy pleural parenchymal density in the right lower hemithorax is stable as is the pleural thickening towards the upper right chest. The cardiopericardial silhouette is enlarged. Interstitial markings are diffusely coarsened with chronic features. Imaged bony structures of the thorax are intact.  IMPRESSION: No substantial change in the patchy pleural parenchymal densities in the right hemithorax with associated volume loss.  Original Report Authenticated By: ERIC A. MANSELL, M.D.    Date: 05/09/2012  Rate: 107  Rhythm: sinus tachycardia  QRS Axis: normal  Intervals: normal  ST/T Wave abnormalities: normal  Conduction Disutrbances:none  Narrative Interpretation: prolonged QT noted on prior tracing not seen today, rate increased today from 82  Old EKG Reviewed: changes noted    1. Hypoglycemia   2. Generalized weakness   3. Dehydration       MDM  As ordered due to the patient's complaint of generalized weakness for the last week. However, after completion  of initial workup his wife arrives and reports that he has been generally weak since his surgery in May. Has been no change in his symptoms in the last week. He has seen his primary Dr. for his labile blood sugars and adjustments have recently been made. She expresses concern that a large number of medication changes were made after surgery and this may be contributing to his symptoms. His labs today demonstrate leukocytosis and improved anemia (though I suspect hemoconcentration/dehydration given tachcyardia and presenting complaint as well as new slight hypercalcemia compared to prior check). PE is considered but doubtful given  lack of chest pain, improved tachycardia with small fluid bolus, improved SOB after neb tx. Electrolytes are otherwise normal. CXR with no evidence of infectious process. No UTI. Head CT with no acute findings. EKG with no concerning changes, troponin negative. BS stable in ED. A bolus of fluid is given in the ED. Neb breathing tx x 1 improved wheezing and suspected SOB. I feel the patient can safely be discharged at this time and have outpatient follow-up with both cardiology (for medication review) and PMD (for insulin adjustments) next week. Results are discussed at length with pt and spouse; he is advised to monitor his blood sugar more closely and reduce his sliding scale insulin dose until PMD re-eval. Return precautions are discussed.        Shaaron Adler, [place] 05/09/12 [phone]

## 2012-05-09 NOTE — ED Notes (Signed)
Patient was brought in by ambulance with complaint of syncopal episode twice today. EMS noted that the patient was hypoglycemic with CBG= 41. Pt was given orange juice and oral glucose and his CBG went up to 104. Pt stated that he had a bacon, egg and cheese sandwich and he took his 80 units of Lantus with 25 units prior to eating breakfast. Pt is A/A/Ox4, skin is warm and dry, respiration is even and unlabored.

## 2012-05-10 NOTE — ED Provider Notes (Signed)
Medical screening examination/treatment/procedure(s) were performed by non-physician practitioner and as supervising physician I was immediately available for consultation/collaboration.  Ethelda Chick, MD 05/10/12 1001

## 2012-06-02 ENCOUNTER — Ambulatory Visit: Payer: Managed Care, Other (non HMO) | Admitting: Adult Health

## 2012-06-05 ENCOUNTER — Ambulatory Visit (INDEPENDENT_AMBULATORY_CARE_PROVIDER_SITE_OTHER): Payer: Managed Care, Other (non HMO) | Admitting: Adult Health

## 2012-06-05 ENCOUNTER — Other Ambulatory Visit (INDEPENDENT_AMBULATORY_CARE_PROVIDER_SITE_OTHER): Payer: Managed Care, Other (non HMO)

## 2012-06-05 ENCOUNTER — Ambulatory Visit (INDEPENDENT_AMBULATORY_CARE_PROVIDER_SITE_OTHER)
Admission: RE | Admit: 2012-06-05 | Discharge: 2012-06-05 | Disposition: A | Discharge status: Home / Self Care | Payer: Managed Care, Other (non HMO) | Source: Ambulatory Visit | Attending: Adult Health | Admitting: Adult Health

## 2012-06-05 ENCOUNTER — Other Ambulatory Visit: Payer: Self-pay | Admitting: Adult Health

## 2012-06-05 ENCOUNTER — Encounter: Payer: Self-pay | Admitting: Adult Health

## 2012-06-05 VITALS — BP 110/60 | HR 106 | Temp 97.2°F | Ht 71.0 in | Wt 281.0 lb

## 2012-06-05 DIAGNOSIS — N19 Unspecified kidney failure: Secondary | ICD-10-CM

## 2012-06-05 DIAGNOSIS — J441 Chronic obstructive pulmonary disease with (acute) exacerbation: Secondary | ICD-10-CM

## 2012-06-05 DIAGNOSIS — R790 Abnormal level of blood mineral: Secondary | ICD-10-CM

## 2012-06-05 DIAGNOSIS — J449 Chronic obstructive pulmonary disease, unspecified: Secondary | ICD-10-CM

## 2012-06-05 LAB — PHOSPHORUS: Phosphorus: 2.7 mg/dL (ref 2.3–4.6)

## 2012-06-05 LAB — BASIC METABOLIC PANEL
BUN: 17 mg/dL (ref 6–23)
CO2: 30 mEq/L (ref 19–32)
Chloride: 101 mEq/L (ref 96–112)
Creatinine, Ser: 0.9 mg/dL (ref 0.4–1.5)
Glucose, Bld: 73 mg/dL (ref 70–99)

## 2012-06-05 NOTE — Patient Instructions (Addendum)
Continue on current regimen. Work on stopping smoking.  I will call with lab and xray results.  Follow up Dr. Ramaswamy in 4 -6 weeks and As needed    Please contact office for sooner follow up if symptoms do not improve or worsen or seek emergency care     

## 2012-06-05 NOTE — Progress Notes (Signed)
Subjective:    Patient ID: [name], male    DOB: [birth year], 61 y.o.   MRN: [id number]  HPI HPIProblem List 1. tobacco abuse (wellbutrin intolerance hx - insomnia in early 2011, chantix intolerance )  - quit Nov 2012 after admission for pna - relapsed Jan/FEb 2013  2A. Copd nos  -bullous emphysema on CT and isolated low DLCO on PFT 2008; did not desaturate Jan and FEb 2013  -  PFT Feb 2013: 01/02/12: fvc 2.8L/57%, fev1 2.2L/59%, Ratio 78 (104%), 14% BD response, small airways 64%, TLC 95%, DLCO 18.7/55%  - spirometry March 2013: fev1 2.Marland Kitchen4L/63%, Ratio 93 - suggestive of restriction  2B. AECOPD - Nov 2012 admit for pneumonia  - Jan 2013 OPD Rx  - March 2013 - presumed opd Rx  3. Mediastinal nodes Stable Oct 2009 - July 2011; no further fu  4.RUL pulmonary nodule  - 5mm July 2011 -> sept 2012,  unchanged  - new 1.4 cm cavity and 9mm nodule RUL - Feb 2013, PEt Indeterminate and Oncimmune serum antigen 7 panel - negative  - March 2013 -   5. Obesity/osa on cpap and cough  Body mass index is 42.40 kg/(m^2). on 12/21/2011 Body mass index is 42.40 kg/(m^2). on 12/21/2011  6. RLL pulmonary scarring  Stable CT 2008 -> 2011; no further fu  7. CAD - s/p stent  OV 02/28/2012 Followup after repeat CT chest. No new symptoms. Stil dyspneic class 2-3. Denies AECOPD symptoms though he seemed to suggest that to the CMA during intake. Spirometrytoday fev1 2.4L/63%.  Super D Ct chest today shows increase in RUL 1.4 cm cavity to 1.5cm but no change in adjacent in the 9mm though I think this is denser. In addition, there are non-specific scattered ggo in the right lower lobe. In terms of smoking: not quitting. Struggling. Does not want help. 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  Unfortunately readmitted 6/19-/21 for weakness found to have  hypomagnesemia ,  hypokalemic  Complicated by ATN/worsening renal failure  Tx w/ electrolyte replacement.  Today labs show scr stable at 1.2  And K+ slightly low at 3.4.  KDur -added back  and instructions to follow up at PCP for further monitoring.  Last cxr with improved aeration on 04/22/12 at post op follow up with Dr. Laneta Simmers  He is feeling much better since discharge w/ decreased dypsnea.  Energy level is slowly returning.  >labs   06/05/12 Follow up  Pt returns for follow up  Says he is feeling better , still feels weak.  Breathing is back to baseline . Remains on QVAR and Spiriva.  Magnesium level was found to remain low at last labs done 1 month ago, started on mag oxide.  No chest pain or edema.  Continues to smoke, discussed smoking cessation .     Review of Systems Constitutional:   No  weight loss, night sweats,  Fevers, chills, ++fatigue, or  lassitude.  HEENT:   No headaches,  Difficulty swallowing,  Tooth/dental problems, or  Sore throat,                No sneezing, itching, ear ache, nasal congestion, post nasal drip,   CV:  No chest pain,  Orthopnea, PND, swelling in lower extremities, anasarca, dizziness, palpitations, syncope.   GI  No heartburn, indigestion, abdominal pain, nausea, vomiting, diarrhea, change in  bowel habits, loss of appetite, bloody stools.   Resp:    No coughing up of blood.    No chest wall deformity  Skin: no rash or lesions.  GU: no dysuria, change in color of urine, no urgency or frequency.  No flank pain, no hematuria   MS:  No joint pain or swelling.  No decreased range of motion.  No back pain.  Psych:  No change in mood or affect. No depression or anxiety.  No memory loss.         Objective:   Physical Exam GEN: A/Ox3; pleasant , NAD, obese   HEENT:  [place]/AT,  EACs-clear, TMs-wnl, NOSE-clear, THROAT-clear, no lesions, no postnasal drip or exudate noted.   NECK:  Supple w/ fair ROM; no JVD; normal carotid impulses w/o bruits; no thyromegaly or  nodules palpated; no lymphadenopathy.  RESP  Coarse BS no accessory muscle use, no dullness to percussion    CARD:  RRR, no m/r/g  , no peripheral edema, pulses intact, no cyanosis or clubbing.  GI:   Soft & nt; nml bowel sounds; no organomegaly or masses detected.  Musco: Warm bil, no deformities or joint swelling noted.   Neuro: alert, no focal deficits noted.    Skin: Warm, no lesions or rashes         Assessment & Plan:

## 2012-06-09 ENCOUNTER — Telehealth: Payer: Self-pay | Admitting: Internal Medicine

## 2012-06-09 NOTE — Telephone Encounter (Signed)
Mag level 1.1 and very low on 06/05/12. Please find out from patient what dose of mag he was taking prior to blood draw and if he is aware of results from 06/05/12 and what dose he is currently taking ?

## 2012-06-10 NOTE — Assessment & Plan Note (Signed)
Continue on current regimen. Work on stopping smoking.  I will call with lab and xray results.  Follow up Dr. Marchelle Gearing in 4 -6 weeks and As needed    Please contact office for sooner follow up if symptoms do not improve or worsen or seek emergency care

## 2012-06-10 NOTE — Telephone Encounter (Signed)
Pt's wife returned call.  Advised of magnesium level and dosage request per MR.  Wife fetched the medication bottle and reported that pt is taking magnesium oxide 400mg  1 tab once daily.  Advised will let MR know and call back with any recs.  MR please advise, thanks!

## 2012-06-10 NOTE — Assessment & Plan Note (Signed)
Labs pending  Cont on mag oxide

## 2012-06-10 NOTE — Telephone Encounter (Signed)
Increase to  magnesium 400mg  bid, recheck bmet, phos, mag in 1 week

## 2012-06-10 NOTE — Telephone Encounter (Signed)
[name] x 1 

## 2012-06-11 NOTE — Telephone Encounter (Signed)
Called pt on cell number, advised to increase his magnesium 400mg  to twice daily and to return in 1 week for repeat labs.  Pt okay with these recs and verbalized his understanding.  Lab orders placed.  Med list updated.  Will sign and forward to MR to make him aware pt has been notified.

## 2012-06-11 NOTE — Telephone Encounter (Signed)
thannks and noted

## 2012-06-11 NOTE — Telephone Encounter (Signed)
lmomtcb x1 

## 2012-06-19 ENCOUNTER — Other Ambulatory Visit (INDEPENDENT_AMBULATORY_CARE_PROVIDER_SITE_OTHER): Payer: Managed Care, Other (non HMO)

## 2012-06-19 LAB — BASIC METABOLIC PANEL
BUN: 19 mg/dL (ref 6–23)
CO2: 28 mEq/L (ref 19–32)
Chloride: 101 mEq/L (ref 96–112)
Creatinine, Ser: 1.1 mg/dL (ref 0.4–1.5)
Glucose, Bld: 170 mg/dL — ABNORMAL HIGH (ref 70–99)
Potassium: 3.5 mEq/L (ref 3.5–5.1)

## 2012-06-22 ENCOUNTER — Telehealth: Payer: Self-pay | Admitting: Internal Medicine

## 2012-06-22 NOTE — Telephone Encounter (Signed)
  Lab 06/19/12 0808  NA 140  K 3.5  CL 101  CO2 28  GLUCOSE 170*  BUN 19  CREATININE 1.1  CALCIUM 9.3  MG 1.3*  PHOS 3.5   The mag level 1.3 is better after 400mg  bid but still very low. Please have him to increase it to 400mg  tid  Also, please ask him to seeo see Dr Elvis Coil (copied on epic) renal because of persistent low mag

## 2012-06-23 MED ORDER — MAGNESIUM OXIDE 400 MG PO TABS: 400.0000 mg | ORAL_TABLET | Freq: Three times a day (TID) | ORAL | Status: DC

## 2012-06-23 NOTE — Telephone Encounter (Signed)
[email or web address].Jennifer Castillo, CMA  

## 2012-06-23 NOTE — Telephone Encounter (Signed)
Spoke with pt's spouse and notified of recs per MR. She verbalized understanding and states will inform the pt. She states that the pt will need a new rx called in since we are increasing the dose. Rx was sent, and the pt to call Dr. Daphine Deutscher for f/u.

## 2012-07-18 ENCOUNTER — Other Ambulatory Visit: Payer: Self-pay | Admitting: Surgery

## 2012-07-18 DIAGNOSIS — J930 Spontaneous tension pneumothorax: Secondary | ICD-10-CM

## 2012-07-22 ENCOUNTER — Ambulatory Visit (INDEPENDENT_AMBULATORY_CARE_PROVIDER_SITE_OTHER): Payer: Managed Care, Other (non HMO) | Admitting: Surgery

## 2012-07-22 ENCOUNTER — Encounter: Payer: Self-pay | Admitting: Surgery

## 2012-07-22 ENCOUNTER — Ambulatory Visit
Admission: RE | Admit: 2012-07-22 | Discharge: 2012-07-22 | Disposition: A | Discharge status: Home / Self Care | Payer: Managed Care, Other (non HMO) | Source: Ambulatory Visit | Attending: Surgery | Admitting: Surgery

## 2012-07-22 VITALS — BP 142/72 | HR 100 | Resp 18 | Ht 71.0 in | Wt 290.0 lb

## 2012-07-22 DIAGNOSIS — C3490 Malignant neoplasm of unspecified part of unspecified bronchus or lung: Secondary | ICD-10-CM

## 2012-07-22 DIAGNOSIS — J930 Spontaneous tension pneumothorax: Secondary | ICD-10-CM

## 2012-07-22 NOTE — Progress Notes (Signed)
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 I last saw him in June at which time he was reporting new onset of a tremor and he was found to have a very low serum magnesium. He was admitted to the hospital by pulmonary medicine and his electrolytes were corrected. He said that he is still running a low magnesium and is on daily supplementation. He has continued to abstain from smoking but is using an electronic cigarette. He said that he has been eating too well and is gaining some weight. He is no longer using oxygen during the day but uses it at night due to sleep apnea. He has mild right chest wall discomfort but denies shortness of breath.   Current Outpatient Prescriptions  Medication Sig Dispense Refill  . aspirin EC 81 MG tablet Take 81 mg by mouth daily.      . beclomethasone (QVAR) 80 MCG/ACT inhaler Inhale 2 puffs into the lungs 2 (two) times daily.  1 Inhaler  6  . calcium carbonate (OS-CAL - DOSED IN MG OF ELEMENTAL CALCIUM) 1250 MG tablet Take 1 tablet (500 mg of elemental calcium total) by mouth daily.  30 tablet  1  . diltiazem (CARDIZEM CD) 120 MG 24 hr capsule Take 1 capsule (120 mg total) by mouth daily.  30 capsule  1  . FLUoxetine (PROZAC) 40 MG capsule Take 40 mg by mouth daily.        . folic acid (FOLVITE) 1 MG tablet Take 1 mg by mouth Daily.      Marland Kitchen gabapentin (NEURONTIN) 300 MG capsule Take 1 capsule (300 mg total) by mouth 3 (three) times daily.  90 capsule  1  . insulin aspart (NOVOLOG) 100 UNIT/ML injection Inject 10-25 Units into the skin 3 (three) times daily before meals. Home sliding scale; >140 = 10 units up to 25 units      . insulin glargine (LANTUS) 100 UNIT/ML injection Inject 80 Units into the  skin every morning.       Marland Kitchen KLOR-CON M20 20 MEQ tablet TAKE 1 TABLET (20 MEQ TOTAL) BY MOUTH DAILY.  30 tablet  0  . levalbuterol (XOPENEX) 0.63 MG/3ML nebulizer solution Take 1 ampule by nebulization every 4 (four) hours as needed. For shortness of breath      . magnesium oxide (MAG-OX) 400 MG tablet Take 1 tablet (400 mg total) by mouth 3 (three) times daily.  90 tablet  0  . Multiple Vitamin (MULTIVITAMIN WITH MINERALS) TABS Take 1 tablet by mouth daily.      . naphazoline-pheniramine (NAPHCON-A) 0.025-0.3 % ophthalmic solution Place 1 drop into both eyes daily. For dry eyes      . omeprazole (PRILOSEC) 20 MG capsule Take 20 mg by mouth daily.        . pantoprazole (PROTONIX) 40 MG tablet Take 40 mg by mouth daily.      . potassium chloride SA (K-DUR,KLOR-CON) 20 MEQ tablet Take 20 mEq by mouth daily.      Marland Kitchen thiamine (VITAMIN B-1) 100 MG tablet Take 100 mg  by mouth daily.      Marland Kitchen tiotropium (SPIRIVA) 18 MCG inhalation capsule Place 18 mcg into inhaler and inhale daily.        . traZODone (DESYREL) 100 MG tablet Take 100 mg by mouth at bedtime.           Physical Exam: BP 142/72  Pulse 100  Resp 18  Ht 5\' 11"  (1.803 m)  Wt 290 lb (131.543 kg)  BMI 40.45 kg/m2  SpO2 94% He looks well. There is no cervical or supraclavicular adenopathy. Cardiac exam shows a regular rate and rhythm with normal heart sounds. Lung exam is clear. The right thoracotomy incision is well-healed. There are no skin lesions. Abdominal exam shows him to be morbidly obese. There are no obvious palpable masses or organomegaly.   Diagnostic Tests:  Clinical Data: Status post right upper lobectomy for non-small cell lung carcinoma in 06/2012, follow-up   CHEST - 2 VIEW   Comparison: Chest x-ray of 06/05/2012   Findings: Aeration of the right lung has improved somewhat. Pleuroparenchymal scarring remains on the right.  The left lung is clear.  Cardiomegaly is stable.  Degenerative changes are  present throughout the thoracic spine.   IMPRESSION: Stable areas of scarring in the right hemithorax.  No significant change.  Stable cardiomegaly.     Original Report Authenticated By: Juline Patch, M.D.     Impression:  He is doing well 4 months status post resection of a stage IA squamous cell carcinoma of the lung.  Plan:  I will plan to see him back in 3 months and we'll do a CT scan the chest at that time.

## 2012-07-23 ENCOUNTER — Ambulatory Visit: Payer: Managed Care, Other (non HMO) | Admitting: Internal Medicine

## 2012-08-11 ENCOUNTER — Other Ambulatory Visit: Payer: Self-pay | Admitting: Internal Medicine

## 2012-08-14 ENCOUNTER — Telehealth: Payer: Self-pay | Admitting: Internal Medicine

## 2012-08-14 NOTE — Telephone Encounter (Signed)
Got a note from [employer] fax [phone] at Dr Michaell Cowing office for medical clearance for hiatal hernia repair. Please schedule OV for same; first available

## 2012-08-15 NOTE — Telephone Encounter (Signed)
appt made for 08-21-12. Carron Curie, CMA

## 2012-08-15 NOTE — Telephone Encounter (Signed)
LMTCBx1.Jennifer Castillo, CMA  

## 2012-08-21 ENCOUNTER — Ambulatory Visit (INDEPENDENT_AMBULATORY_CARE_PROVIDER_SITE_OTHER): Payer: Managed Care, Other (non HMO) | Admitting: Internal Medicine

## 2012-08-21 ENCOUNTER — Telehealth: Payer: Self-pay | Admitting: Internal Medicine

## 2012-08-21 ENCOUNTER — Encounter: Payer: Self-pay | Admitting: Internal Medicine

## 2012-08-21 VITALS — BP 140/70 | HR 105 | Temp 98.4°F | Ht 71.0 in | Wt 287.4 lb

## 2012-08-21 DIAGNOSIS — J449 Chronic obstructive pulmonary disease, unspecified: Secondary | ICD-10-CM

## 2012-08-21 DIAGNOSIS — Z01811 Encounter for preprocedural respiratory examination: Secondary | ICD-10-CM

## 2012-08-21 DIAGNOSIS — F17200 Nicotine dependence, unspecified, uncomplicated: Secondary | ICD-10-CM

## 2012-08-21 DIAGNOSIS — Z23 Encounter for immunization: Secondary | ICD-10-CM

## 2012-08-21 DIAGNOSIS — J4489 Other specified chronic obstructive pulmonary disease: Secondary | ICD-10-CM

## 2012-08-21 NOTE — Telephone Encounter (Signed)
I got a note for preop pulm clearance on this patient but he tells me that he never saw you and he does not know he has hiatal hernia. He only has RUQ ventral hernia for which he has not yet seen anypne.  Was this patient or someone else with same name ?

## 2012-08-21 NOTE — Patient Instructions (Addendum)
#  COPD - stable, continue current regimen  - have flu shot today 08/21/2012  #Lung cancer surveillance   - per Dr Laneta Simmers  #Low mag  - per Dr Hyman Hopes  #hernia  - I will check with Dr Michaell Cowing if there was some confusion about name and get back to you  #Followup  6 months or sooner for copd if desired

## 2012-08-21 NOTE — Progress Notes (Signed)
Subjective:    Patient ID: [name], male    DOB: May 10, 1951, 61 y.o.   MRN: [id number]  HPI 1. tobacco abuse (wellbutrin intolerance hx - insomnia in early 2011, chantix intolerance )  - quit Nov 2012 after admission for pna - relapsed Jan/FEb 2013. Quit May 2013 after lobectomy for cancer - e-cigs as of Aug and Oct 2013  2A. 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 AECOPD - Nov 2012 admit for pneumonia  - Jan 2013 OPD Rx  - March 2013 - presumed opd Rx  3. Mediastinal nodes Stable Oct 2009 - July 2011; no further fu  4.RUL pulmonary nodule  - 5mm July 2011 -> sept 2012,  unchanged  - new 1.4 cm cavity and 9mm nodule RUL - Feb 2013,  - May 2013  - 1.9cm T1a, N0, M0  Stage 1A NSCLC with 0.3cm carcinoid - s/p lobectomy   5. Obesity/osa on cpap and cough  Body mass index is 42.40 kg/(m^2). on 12/21/2011 Body mass index is 42.40 kg/(m^2). on 12/21/2011  6. RLL pulmonary scarring  Stable CT 2008 -> 2011; no further fu  7. CAD - s/p stent  8. Hx of renal failure and persistent hypomagnesmia  - onset May 2013 following lobectomy -> renal failure resolved  - Oct 2013 - persistent hypomag being followed by Dr Lajuana Carry      OV 08/21/2012   FU - COPD - baseline symptoms only. CAT score is 15 (see below). Feels well. Will have flu shot today 08/21/2012  - Smoker - has quit conventional cigs but doing e-cigs since dc from lung cancer surgery. Buys it at [employer]. Pack a week. Does not realize that is bad. He says he thought it was good for him   - Low mag - seeing Dr Daphine Deutscher and is on heavy replacement. Says levels holding. Reviewed Dr Marland Mcalpine recent note who concurst that cause of hypomag is  puzzling  -  Lung cancer- saw Dr Laneta Simmers last month. CXR ok. Has ct scan upcoming in few months   - new issue: - PReop pulmonary clearance - says he has no idea he has a hiatal hernia. NEver seen a Dr Michaell Cowing. He is puzzled he needs preop eval. However, he does have RUQ ventral hernia and is now intrigued about surgery for it becuaes he has pain in the RUQ area. Upon review of EMR, there is no record he has seen [facility] Dr Michaell Cowing but I did receive a note asking for surgical clearance but I suspect it was another patient of same name    CAT COPD Symptom & Quality of Life Score (GSK trademark) 0 is no burden. 5 is highest burden 08/21/2012   Never Cough -> Cough all the time 2  No phlegm in chest -> Chest is full of phlegm 1  No chest tightness -> Chest feels very tight 1  No dyspnea for 1 flight stairs/hill -> Very dyspneic for 1 flight of stairs 2  No limitations for ADL at home -> Very limited with ADL at home 2  Confident leaving home -> Not at all confident leaving home 2  Sleep soundly -> Do not sleep soundly because  of lung condition 3  Lots of Energy -> No energy at all 2  TOTAL Score (max 40)  15    Past, Family, Social reviewed: no change since last visit  Review of Systems  Constitutional: Negative for fever and unexpected weight change.  HENT: Negative for ear pain, nosebleeds, congestion, sore throat, rhinorrhea, sneezing, trouble swallowing, dental problem, postnasal drip and sinus pressure.   Eyes: Negative for redness and itching.  Respiratory: Negative for cough, chest tightness, shortness of breath and wheezing.   Cardiovascular: Negative for palpitations and leg swelling.  Gastrointestinal: Negative for nausea and vomiting.  Genitourinary: Negative for dysuria.  Musculoskeletal: Negative for joint swelling.  Skin: Negative for rash.  Neurological: Negative for headaches.  Hematological: Does not bruise/bleed easily.  Psychiatric/Behavioral: Negative for dysphoric mood.  The patient is not nervous/anxious.        Objective:   Physical Exam GEN: A/Ox3; pleasant , NAD, obese   HEENT:  [place]/AT,  EACs-clear, TMs-wnl, NOSE-clear, THROAT-clear, no lesions, no postnasal drip or exudate noted.   NECK:  Supple w/ fair ROM; no JVD; normal carotid impulses w/o bruits; no thyromegaly or nodules palpated; no lymphadenopathy.  RESP  Coarse BS no accessory muscle use, no dullness to percussion    CARD:  RRR, no m/r/g  , no peripheral edema, pulses intact, no cyanosis or clubbing.  GI:   Soft & nt; nml bowel sounds; no organomegaly or masses detected.  Musco: Warm bil, no deformities or joint swelling noted.   Neuro: alert, no focal deficits noted.    Skin: Warm, no lesions or rashes         Assessment & Plan:

## 2012-08-22 DIAGNOSIS — Z01811 Encounter for preprocedural respiratory examination: Secondary | ICD-10-CM | POA: Insufficient documentation

## 2012-08-22 NOTE — Assessment & Plan Note (Signed)
He denies having hiatal hernia or having seen Dr Michaell Cowing. I am suspecting Dr Michaell Cowing sent a note on a different patient of same name. I will check on this with Dr Gordy Savers.

## 2012-08-22 NOTE — Assessment & Plan Note (Signed)
Disease status is stable  Plan Continue meds Flu shot today 08/21/12 At fu will discuss repeat rehab and ensure his alpha 1 was checked and check spiro

## 2012-08-22 NOTE — Assessment & Plan Note (Signed)
He has no  Idea that e cigs are harmful. I cautioned him that while it is a good tool to help quit it is harmful in itself. He says he will think about it. He is not ready to quit right now  3 min counseling face to face

## 2012-08-25 NOTE — Telephone Encounter (Signed)
I cannot find any reason why I sent this patient to you

## 2012-09-10 NOTE — Telephone Encounter (Signed)
plese let patient know that it was not him (maybe another patient of same name) they asked for preop clearance. Anyways, now he is interesed in hernia repair but I would advise he hold off till hhis magnesium issues sorted out. Fu as per prior OV or 6 months

## 2012-09-12 NOTE — Telephone Encounter (Signed)
LMTCBx1.Marylen Zuk, CMA  

## 2012-09-15 NOTE — Telephone Encounter (Signed)
Spoke with pt spouse and advised. Carron Curie, CMA

## 2012-09-17 ENCOUNTER — Other Ambulatory Visit: Payer: Self-pay | Admitting: Surgery

## 2012-09-17 DIAGNOSIS — D381 Neoplasm of uncertain behavior of trachea, bronchus and lung: Secondary | ICD-10-CM

## 2012-09-21 ENCOUNTER — Emergency Department (HOSPITAL_COMMUNITY): Payer: Managed Care, Other (non HMO)

## 2012-09-21 ENCOUNTER — Encounter (HOSPITAL_COMMUNITY): Payer: Self-pay | Admitting: Physical Medicine and Rehabilitation

## 2012-09-21 ENCOUNTER — Inpatient Hospital Stay (HOSPITAL_COMMUNITY)
Admission: EM | Admit: 2012-09-21 | Discharge: 2012-09-24 | DRG: 189 | Disposition: A | Discharge status: Home / Self Care | Payer: Managed Care, Other (non HMO) | Attending: Internal Medicine | Admitting: Internal Medicine

## 2012-09-21 DIAGNOSIS — M19049 Primary osteoarthritis, unspecified hand: Secondary | ICD-10-CM | POA: Diagnosis present

## 2012-09-21 DIAGNOSIS — F329 Major depressive disorder, single episode, unspecified: Secondary | ICD-10-CM | POA: Diagnosis present

## 2012-09-21 DIAGNOSIS — Z888 Allergy status to other drugs, medicaments and biological substances status: Secondary | ICD-10-CM

## 2012-09-21 DIAGNOSIS — Z79899 Other long term (current) drug therapy: Secondary | ICD-10-CM

## 2012-09-21 DIAGNOSIS — K449 Diaphragmatic hernia without obstruction or gangrene: Secondary | ICD-10-CM | POA: Diagnosis present

## 2012-09-21 DIAGNOSIS — I252 Old myocardial infarction: Secondary | ICD-10-CM

## 2012-09-21 DIAGNOSIS — F17200 Nicotine dependence, unspecified, uncomplicated: Secondary | ICD-10-CM | POA: Diagnosis present

## 2012-09-21 DIAGNOSIS — J9600 Acute respiratory failure, unspecified whether with hypoxia or hypercapnia: Principal | ICD-10-CM | POA: Diagnosis present

## 2012-09-21 DIAGNOSIS — B9789 Other viral agents as the cause of diseases classified elsewhere: Secondary | ICD-10-CM | POA: Diagnosis present

## 2012-09-21 DIAGNOSIS — J441 Chronic obstructive pulmonary disease with (acute) exacerbation: Secondary | ICD-10-CM

## 2012-09-21 DIAGNOSIS — D72829 Elevated white blood cell count, unspecified: Secondary | ICD-10-CM | POA: Diagnosis present

## 2012-09-21 DIAGNOSIS — C3411 Malignant neoplasm of upper lobe, right bronchus or lung: Secondary | ICD-10-CM | POA: Diagnosis present

## 2012-09-21 DIAGNOSIS — Z7982 Long term (current) use of aspirin: Secondary | ICD-10-CM

## 2012-09-21 DIAGNOSIS — IMO0002 Reserved for concepts with insufficient information to code with codable children: Secondary | ICD-10-CM | POA: Diagnosis present

## 2012-09-21 DIAGNOSIS — Z794 Long term (current) use of insulin: Secondary | ICD-10-CM

## 2012-09-21 DIAGNOSIS — Z8249 Family history of ischemic heart disease and other diseases of the circulatory system: Secondary | ICD-10-CM

## 2012-09-21 DIAGNOSIS — E079 Disorder of thyroid, unspecified: Secondary | ICD-10-CM | POA: Diagnosis present

## 2012-09-21 DIAGNOSIS — J9601 Acute respiratory failure with hypoxia: Secondary | ICD-10-CM | POA: Diagnosis present

## 2012-09-21 DIAGNOSIS — Z902 Acquired absence of lung [part of]: Secondary | ICD-10-CM

## 2012-09-21 DIAGNOSIS — R0902 Hypoxemia: Secondary | ICD-10-CM

## 2012-09-21 DIAGNOSIS — F3289 Other specified depressive episodes: Secondary | ICD-10-CM | POA: Diagnosis present

## 2012-09-21 DIAGNOSIS — I498 Other specified cardiac arrhythmias: Secondary | ICD-10-CM | POA: Diagnosis present

## 2012-09-21 DIAGNOSIS — Z6841 Body Mass Index (BMI) 40.0 and over, adult: Secondary | ICD-10-CM

## 2012-09-21 DIAGNOSIS — E869 Volume depletion, unspecified: Secondary | ICD-10-CM | POA: Diagnosis present

## 2012-09-21 DIAGNOSIS — Z9861 Coronary angioplasty status: Secondary | ICD-10-CM

## 2012-09-21 DIAGNOSIS — C3490 Malignant neoplasm of unspecified part of unspecified bronchus or lung: Secondary | ICD-10-CM | POA: Diagnosis present

## 2012-09-21 DIAGNOSIS — E119 Type 2 diabetes mellitus without complications: Secondary | ICD-10-CM | POA: Diagnosis present

## 2012-09-21 DIAGNOSIS — J4489 Other specified chronic obstructive pulmonary disease: Secondary | ICD-10-CM | POA: Diagnosis present

## 2012-09-21 DIAGNOSIS — J449 Chronic obstructive pulmonary disease, unspecified: Secondary | ICD-10-CM | POA: Diagnosis present

## 2012-09-21 DIAGNOSIS — I2510 Atherosclerotic heart disease of native coronary artery without angina pectoris: Secondary | ICD-10-CM | POA: Diagnosis present

## 2012-09-21 DIAGNOSIS — N179 Acute kidney failure, unspecified: Secondary | ICD-10-CM

## 2012-09-21 DIAGNOSIS — G609 Hereditary and idiopathic neuropathy, unspecified: Secondary | ICD-10-CM | POA: Diagnosis present

## 2012-09-21 DIAGNOSIS — E669 Obesity, unspecified: Secondary | ICD-10-CM | POA: Diagnosis present

## 2012-09-21 DIAGNOSIS — N189 Chronic kidney disease, unspecified: Secondary | ICD-10-CM | POA: Diagnosis present

## 2012-09-21 DIAGNOSIS — I10 Essential (primary) hypertension: Secondary | ICD-10-CM | POA: Diagnosis present

## 2012-09-21 DIAGNOSIS — K219 Gastro-esophageal reflux disease without esophagitis: Secondary | ICD-10-CM | POA: Diagnosis present

## 2012-09-21 DIAGNOSIS — I959 Hypotension, unspecified: Secondary | ICD-10-CM | POA: Diagnosis present

## 2012-09-21 DIAGNOSIS — E876 Hypokalemia: Secondary | ICD-10-CM | POA: Diagnosis present

## 2012-09-21 DIAGNOSIS — Z85118 Personal history of other malignant neoplasm of bronchus and lung: Secondary | ICD-10-CM

## 2012-09-21 DIAGNOSIS — I129 Hypertensive chronic kidney disease with stage 1 through stage 4 chronic kidney disease, or unspecified chronic kidney disease: Secondary | ICD-10-CM | POA: Diagnosis present

## 2012-09-21 DIAGNOSIS — A088 Other specified intestinal infections: Secondary | ICD-10-CM | POA: Diagnosis present

## 2012-09-21 DIAGNOSIS — J069 Acute upper respiratory infection, unspecified: Secondary | ICD-10-CM | POA: Diagnosis present

## 2012-09-21 DIAGNOSIS — G4733 Obstructive sleep apnea (adult) (pediatric): Secondary | ICD-10-CM | POA: Diagnosis present

## 2012-09-21 DIAGNOSIS — G4730 Sleep apnea, unspecified: Secondary | ICD-10-CM | POA: Diagnosis present

## 2012-09-21 LAB — URINALYSIS, ROUTINE W REFLEX MICROSCOPIC
Glucose, UA: NEGATIVE mg/dL
Protein, ur: 100 mg/dL — AB
Urobilinogen, UA: 1 mg/dL (ref 0.0–1.0)

## 2012-09-21 LAB — POCT I-STAT 3, ART BLOOD GAS (G3+)
Bicarbonate: 26.9 mEq/L — ABNORMAL HIGH (ref 20.0–24.0)
TCO2: 28 mmol/L (ref 0–100)
pH, Arterial: 7.471 — ABNORMAL HIGH (ref 7.350–7.450)
pO2, Arterial: 57 mmHg — ABNORMAL LOW (ref 80.0–100.0)

## 2012-09-21 LAB — MAGNESIUM: Magnesium: 1.1 mg/dL — ABNORMAL LOW (ref 1.5–2.5)

## 2012-09-21 LAB — BASIC METABOLIC PANEL
BUN: 15 mg/dL (ref 6–23)
CO2: 31 mEq/L (ref 19–32)
Calcium: 8.6 mg/dL (ref 8.4–10.5)
GFR calc non Af Amer: 87 mL/min — ABNORMAL LOW (ref >90)
Glucose, Bld: 192 mg/dL — ABNORMAL HIGH (ref 70–99)

## 2012-09-21 LAB — CBC
HCT: 32.3 % — ABNORMAL LOW (ref 39.0–52.0)
HCT: 33.4 % — ABNORMAL LOW (ref 39.0–52.0)
Hemoglobin: 11 g/dL — ABNORMAL LOW (ref 13.0–17.0)
Hemoglobin: 11.1 g/dL — ABNORMAL LOW (ref 13.0–17.0)
MCH: 29.6 pg (ref 26.0–34.0)
MCH: 29 pg (ref 26.0–34.0)
MCHC: 34.1 g/dL (ref 30.0–36.0)
MCV: 86.8 fL (ref 78.0–100.0)
MCV: 87.2 fL (ref 78.0–100.0)
RBC: 3.72 MIL/uL — ABNORMAL LOW (ref 4.22–5.81)
RBC: 3.83 MIL/uL — ABNORMAL LOW (ref 4.22–5.81)

## 2012-09-21 LAB — MRSA PCR SCREENING: MRSA by PCR: NEGATIVE

## 2012-09-21 LAB — GLUCOSE, CAPILLARY
Glucose-Capillary: 291 mg/dL — ABNORMAL HIGH (ref 70–99)
Glucose-Capillary: 360 mg/dL — ABNORMAL HIGH (ref 70–99)
Glucose-Capillary: 334 mg/dL — ABNORMAL HIGH (ref 70–99)

## 2012-09-21 LAB — POCT I-STAT TROPONIN I: Troponin i, poc: 0.02 ng/mL (ref 0.00–0.08)

## 2012-09-21 LAB — URINE MICROSCOPIC-ADD ON

## 2012-09-21 MED ORDER — DOXYCYCLINE HYCLATE 100 MG IV SOLR
100.0000 mg | Freq: Once | INTRAVENOUS | Status: AC
  Administered 2012-09-21: 100 mg via INTRAVENOUS
  Filled 2012-09-21: qty 100

## 2012-09-21 MED ORDER — INSULIN ASPART 100 UNIT/ML ~~LOC~~ SOLN
0.0000 [IU] | Freq: Three times a day (TID) | SUBCUTANEOUS | Status: DC
  Administered 2012-09-21: 15 [IU] via SUBCUTANEOUS
  Administered 2012-09-22: 3 [IU] via SUBCUTANEOUS
  Administered 2012-09-22: 8 [IU] via SUBCUTANEOUS
  Administered 2012-09-22 – 2012-09-23 (×2): 2 [IU] via SUBCUTANEOUS

## 2012-09-21 MED ORDER — IPRATROPIUM BROMIDE 0.02 % IN SOLN: 0.5000 mg | Freq: Once | RESPIRATORY_TRACT | Status: DC

## 2012-09-21 MED ORDER — SODIUM CHLORIDE 0.9 % IV BOLUS (SEPSIS)
1000.0000 mL | Freq: Once | INTRAVENOUS | Status: AC
  Administered 2012-09-21: 1000 mL via INTRAVENOUS

## 2012-09-21 MED ORDER — INSULIN GLARGINE 100 UNIT/ML ~~LOC~~ SOLN
80.0000 [IU] | Freq: Every day | SUBCUTANEOUS | Status: DC
  Administered 2012-09-21 – 2012-09-22 (×2): 80 [IU] via SUBCUTANEOUS

## 2012-09-21 MED ORDER — ENOXAPARIN SODIUM 40 MG/0.4ML ~~LOC~~ SOLN: 40.0000 mg | SUBCUTANEOUS | Status: DC

## 2012-09-21 MED ORDER — ENOXAPARIN SODIUM 60 MG/0.6ML ~~LOC~~ SOLN: 60.0000 mg | SUBCUTANEOUS | Status: DC

## 2012-09-21 MED ORDER — LEVOFLOXACIN IN D5W 750 MG/150ML IV SOLN
750.0000 mg | INTRAVENOUS | Status: DC
  Administered 2012-09-21 – 2012-09-23 (×3): 750 mg via INTRAVENOUS
  Filled 2012-09-21 (×4): qty 150

## 2012-09-21 MED ORDER — GABAPENTIN 300 MG PO CAPS: 300.0000 mg | ORAL_CAPSULE | Freq: Three times a day (TID) | ORAL | Status: DC

## 2012-09-21 MED ORDER — FLUOXETINE HCL 20 MG PO TABS
40.0000 mg | ORAL_TABLET | Freq: Every day | ORAL | Status: DC
  Administered 2012-09-22 – 2012-09-24 (×3): 40 mg via ORAL
  Filled 2012-09-21 (×3): qty 2

## 2012-09-21 MED ORDER — DILTIAZEM HCL ER COATED BEADS 120 MG PO CP24
120.0000 mg | ORAL_CAPSULE | Freq: Every day | ORAL | Status: DC
  Administered 2012-09-22 – 2012-09-23 (×2): 120 mg via ORAL
  Filled 2012-09-21 (×2): qty 1

## 2012-09-21 MED ORDER — MAGNESIUM OXIDE 400 (241.3 MG) MG PO TABS
400.0000 mg | ORAL_TABLET | Freq: Three times a day (TID) | ORAL | Status: DC
  Administered 2012-09-21 – 2012-09-24 (×9): 400 mg via ORAL
  Filled 2012-09-21 (×12): qty 1

## 2012-09-21 MED ORDER — ALBUTEROL SULFATE (5 MG/ML) 0.5% IN NEBU
2.5000 mg | INHALATION_SOLUTION | RESPIRATORY_TRACT | Status: DC
  Administered 2012-09-21 – 2012-09-22 (×7): 2.5 mg via RESPIRATORY_TRACT
  Filled 2012-09-21 (×7): qty 0.5

## 2012-09-21 MED ORDER — ALBUTEROL (5 MG/ML) CONTINUOUS INHALATION SOLN
10.0000 mg/h | INHALATION_SOLUTION | RESPIRATORY_TRACT | Status: AC
  Administered 2012-09-21: 10 mg/h via RESPIRATORY_TRACT
  Filled 2012-09-21: qty 20

## 2012-09-21 MED ORDER — SODIUM CHLORIDE 0.9 % IV SOLN
INTRAVENOUS | Status: DC
  Administered 2012-09-21 – 2012-09-23 (×5): via INTRAVENOUS

## 2012-09-21 MED ORDER — SODIUM CHLORIDE 0.9 % IV SOLN
Freq: Once | INTRAVENOUS | Status: AC
  Administered 2012-09-21: 09:00:00 via INTRAVENOUS

## 2012-09-21 MED ORDER — FLUOXETINE HCL 20 MG PO TABS
40.0000 mg | ORAL_TABLET | Freq: Every day | ORAL | Status: DC
  Filled 2012-09-21: qty 2

## 2012-09-21 MED ORDER — INSULIN ASPART 100 UNIT/ML ~~LOC~~ SOLN
3.0000 [IU] | Freq: Three times a day (TID) | SUBCUTANEOUS | Status: DC
  Administered 2012-09-21 – 2012-09-24 (×9): 3 [IU] via SUBCUTANEOUS

## 2012-09-21 MED ORDER — VANCOMYCIN HCL 1000 MG IV SOLR
2000.0000 mg | Freq: Once | INTRAVENOUS | Status: AC
  Administered 2012-09-21: 2000 mg via INTRAVENOUS
  Filled 2012-09-21: qty 2000

## 2012-09-21 MED ORDER — ASPIRIN EC 81 MG PO TBEC
81.0000 mg | DELAYED_RELEASE_TABLET | Freq: Every day | ORAL | Status: DC
  Administered 2012-09-21 – 2012-09-24 (×4): 81 mg via ORAL
  Filled 2012-09-21 (×4): qty 1

## 2012-09-21 MED ORDER — GABAPENTIN 300 MG PO CAPS
300.0000 mg | ORAL_CAPSULE | Freq: Three times a day (TID) | ORAL | Status: DC
  Administered 2012-09-21 – 2012-09-24 (×9): 300 mg via ORAL
  Filled 2012-09-21 (×11): qty 1

## 2012-09-21 MED ORDER — IPRATROPIUM BROMIDE 0.02 % IN SOLN
0.5000 mg | Freq: Once | RESPIRATORY_TRACT | Status: AC
  Administered 2012-09-21: 0.5 mg via RESPIRATORY_TRACT
  Filled 2012-09-21: qty 2.5

## 2012-09-21 MED ORDER — MAGNESIUM 250 MG PO TABS: 500.0000 mg | ORAL_TABLET | Freq: Three times a day (TID) | ORAL | Status: DC

## 2012-09-21 MED ORDER — TIOTROPIUM BROMIDE MONOHYDRATE 18 MCG IN CAPS
18.0000 ug | ORAL_CAPSULE | Freq: Every day | RESPIRATORY_TRACT | Status: DC
  Administered 2012-09-22 – 2012-09-24 (×3): 18 ug via RESPIRATORY_TRACT
  Filled 2012-09-21: qty 5

## 2012-09-21 MED ORDER — ENOXAPARIN SODIUM 60 MG/0.6ML ~~LOC~~ SOLN
60.0000 mg | SUBCUTANEOUS | Status: DC
Start: 2012-09-21 — End: 2012-09-24
  Administered 2012-09-21 – 2012-09-23 (×3): 60 mg via SUBCUTANEOUS
  Filled 2012-09-21 (×4): qty 0.6

## 2012-09-21 MED ORDER — SODIUM CHLORIDE 0.9 % IV SOLN
1250.0000 mg | Freq: Two times a day (BID) | INTRAVENOUS | Status: DC
  Administered 2012-09-22 (×2): 1250 mg via INTRAVENOUS
  Filled 2012-09-21 (×3): qty 1250

## 2012-09-21 MED ORDER — PANTOPRAZOLE SODIUM 40 MG PO TBEC
40.0000 mg | DELAYED_RELEASE_TABLET | Freq: Every day | ORAL | Status: DC
  Administered 2012-09-21 – 2012-09-22 (×2): 40 mg via ORAL
  Filled 2012-09-21 (×2): qty 1

## 2012-09-21 MED ORDER — VITAMIN B-1 100 MG PO TABS
100.0000 mg | ORAL_TABLET | Freq: Every day | ORAL | Status: DC
  Administered 2012-09-21 – 2012-09-24 (×4): 100 mg via ORAL
  Filled 2012-09-21 (×4): qty 1

## 2012-09-21 MED ORDER — PREDNISONE 50 MG PO TABS
60.0000 mg | ORAL_TABLET | Freq: Once | ORAL | Status: AC
  Administered 2012-09-22: 60 mg via ORAL
  Filled 2012-09-21: qty 1

## 2012-09-21 MED ORDER — TRAZODONE HCL 100 MG PO TABS
100.0000 mg | ORAL_TABLET | Freq: Every day | ORAL | Status: DC
  Administered 2012-09-21 – 2012-09-23 (×3): 100 mg via ORAL
  Filled 2012-09-21 (×4): qty 1

## 2012-09-21 MED ORDER — CEFTRIAXONE SODIUM 1 G IJ SOLR
1.0000 g | INTRAMUSCULAR | Status: DC
  Administered 2012-09-21 – 2012-09-22 (×2): 1 g via INTRAVENOUS
  Filled 2012-09-21 (×2): qty 10

## 2012-09-21 MED ORDER — PREDNISONE 20 MG PO TABS
60.0000 mg | ORAL_TABLET | Freq: Once | ORAL | Status: AC
  Administered 2012-09-21: 60 mg via ORAL
  Filled 2012-09-21: qty 3

## 2012-09-21 NOTE — ED Notes (Addendum)
Pt presents to department for evaluation of SOB, chest pain, cough and flu like symptoms. Ongoing x3 days. States midsternal CP radiating down R arm. Pt reports green colored sputum. Expiratory wheezing noted upon arrival. Respirations unlabored. Speaking complete sentences. Pt is conscious alert and oriented x4. Received nebulizer treatment per EMS. 20g L hand. CBG 178.

## 2012-09-21 NOTE — ED Notes (Signed)
Attempted to call report to floor. Nurse at lunch, to call back.

## 2012-09-21 NOTE — Progress Notes (Signed)
Patient refused to wear cpap tonight. RT will continue to monitor. 

## 2012-09-21 NOTE — Progress Notes (Signed)
ANTIBIOTIC CONSULT NOTE - INITIAL  Pharmacy Consult for vancomycin, antibiotic adjustment based on renal function Indication: rule out pneumonia  Allergies  Allergen Reactions  . Demerol (Meperidine) Other (See Comments)    Reaction unknown    Vital Signs: Temp: 98.6 F (37 C) (11/17 1045) Temp src: Axillary (11/17 1045) BP: 114/38 mmHg (11/17 1231) Pulse Rate: 120  (11/17 1231) Intake/Output from previous day:   Intake/Output from this shift: Total I/O In: 1000 [I.V.:1000] Out: -   Labs:  Basename 09/21/12 0915  WBC 14.1*  HGB 11.0*  PLT 203  LABCREA --  CREATININE 0.95   The CrCl is unknown because both a height and weight (above a minimum accepted value) are required for this calculation. No results found for this basename: VANCOTROUGH:2,VANCOPEAK:2,VANCORANDOM:2,GENTTROUGH:2,GENTPEAK:2,GENTRANDOM:2,TOBRATROUGH:2,TOBRAPEAK:2,TOBRARND:2,AMIKACINPEAK:2,AMIKACINTROU:2,AMIKACIN:2, in the last 72 hours   Microbiology: No results found for this or any previous visit (from the past 720 hour(s)).  Medical History: Past Medical History  Diagnosis Date  . Anemia   . Allergy   . Thyroid disease   . Parathyroid disease   . Headache   . Hypertension     takes  HYzaar daily  . Coronary artery disease     has 1 stent  . Myocardial infarct   . Asthma   . Emphysema     sees Dr.Ramaswami for this  . COPD (chronic obstructive pulmonary disease)     uses Albuterol and Spiriva daily  . Shortness of breath     with exertion   . Bronchitis   . Productive cough     white in color but no odor  . Sleep apnea     uses BiPaP  . Peripheral neuropathy   . Lung mass     right upper lobe  . Back pain     4 deteriorating disc and receives an injection q3-55mon;has been doing this for about 50yrs  . H/O hiatal hernia   . GERD (gastroesophageal reflux disease)     takes Prilosec daily  . Blood transfusion     as a child  . Glaucoma(365)     hx of  . Depression     takes  Prozac daily  . Insomnia     takes Trazodone nightly  . Anginal pain   . Chronic kidney disease     acute kidney failure post surgery  . Pneumonia     hx of' 61 yo,rd' last time in 2012  . Type II diabetes mellitus     takes Metformin bid and Novolog and Lantus daily  . Arthritis     "hands"    Assessment: 61 yoM admitted 11/17 with SOB, CP, cough with green sputum, and flu-like symptoms x 3 days  Vanc per Rx for ?PNA. Pt also on levoflox and CTX per MD. CrCl > 100 mL/min, abx doses appropriate for renal fnx. Currently afebrile, WBC 14.1, lactic acid 1.54, PCT 0.19  Vanc 11/17>>11/23 (stop date in place) levoflox 11/17>> CTX 11/17 >>  11/17 UA: negative nitrites, trace leukocytes, rare bacteria, few squams 11/17 MRSA PCR: pend 11/17 sputum: pend 11/17 BCx x2: pend  Goal of Therapy:  Vancomycin trough level 15-20 mcg/ml  Plan:  - Vancomycin 2g IV x 1, then 1250mg  IV q12h - Vancomycin trough at SS if indicated - Continue levofloxacin 750mg  IV q24h - Continue CTX 1g q24h - F/u fever curve, clinical course, cultures, abx de-escalation, renal fnx  Thank you for the consult.  Tomi Bamberger, PharmD Clinical Pharmacist Pager: [phone] Pharmacy: [phone] 09/21/2012  3:31 PM

## 2012-09-21 NOTE — ED Provider Notes (Signed)
Medical screening examination/treatment/procedure(s) were conducted as a shared visit with non-physician practitioner(s) and myself.  I personally evaluated the patient during the encounter anticipate admission since the patient has had a few days of chills, cough with green sputum, shortness of breath, hypoxemia with room air pulse oximetry 85% upon arrival, no confusion, he does have diffuse wheezes on examination with moderate respiratory distress at rest, pulse oximetry improves to 90% with nasal cannula oxygen at 2 L, baseline room air pulse oximetry is in the 90s although the patient does have home oxygen if needed.  Hurman Horn, MD 09/24/12 2330

## 2012-09-21 NOTE — Consult Note (Signed)
[facility] History and Physical  [name] WUJ:[id number] DOB: 11-20-1950 DOA: 09/21/2012  Referring physician: Fonnie Jarvis PCP: Leo Grosser, MD  Specialists: CCM  Chief Complaint: Couhg x 3 days  HPI: [name] is a 61 y.o. male with an extensive PMH as detailed below who presents to [facility] ed with coughing for 3 days and a lot of green green sputum, on 11/15.  He states he was in the yard mowing and collecting leaves and thinks that this is what caused this current issue-didn't call PCP or pulm office. The diarrhea started first along with then a cough, and progressed to SOB which worsened this am. Normally does use O2 at home when he sleeps for OSA along with CPAP [doenslt know settings].  He states a headache and nasuea were assosc along with loose bowels about 2 days ago There stool is dark brown and has no blood in it-it is more foul than usual, and maybe greenish NO vomiting, but he also states his appetite is decreased, no abd pain Had a bunch of immunizations last week at Dr. Felisa Bonier office [shingles/whooping cough etc etc] Can usually walk without SOB.  Can maybe walk now 100 ft and then feels dyspnoies Patient's grandson was with him and his wife last weekend and was sick with a runny nose-Patient's wife also has runny diarrhoea and a cough  In the emergency room patient was noted to be tachypneic and his blood pressure dropped to the 90s over 30s. This responded to 1 L of saline with rebound and his blood pressure 111/53. A blood gas was done at my request which showed pH of 7.47 PO2 57 PCO2 of 36.9 His basic metabolic panel seems to be within normal limits Point-of-care troponin was 0.02 Lactic acid venous was 1.5 WBC 14.1, hemoglobin 11.2, hematocrit 32.3, platelets 203, Urinalysis showed specific gravity 1.046 with moderate bilirubin, ketones 15 trace leukocytes and large hemoglobin 2 view Chest x-ray in emergency room showed postsurgical changes to right  hemithorax   Review of Systems: The patient denies no crushing cp, no blurred or double vision, no falls to any one side of the body, SOB seems to have resolved.  Feels + congestion in ears and face with sinus pressure , no sz loc or dizzyness  Feels more comfy sitting up-cannot breath e on laying  No le sweling or no tightness of rings or clothes   Past Medical History  Diagnosis Date  . Anemia   . Allergy   . Thyroid disease   . Parathyroid disease   . Headache   . Hypertension     takes  HYzaar daily  . Coronary artery disease     has 1 stent  . Myocardial infarct   . Asthma   . Emphysema     sees Dr.Ramaswami for this  . COPD (chronic obstructive pulmonary disease)     uses Albuterol and Spiriva daily  . Shortness of breath     with exertion   . Bronchitis   . Productive cough     white in color but no odor  . Sleep apnea     uses BiPaP  . Peripheral neuropathy   . Lung mass     right upper lobe  . Back pain     4 deteriorating disc and receives an injection q3-61mon;has been doing this for about 51yrs  . H/O hiatal hernia   . GERD (gastroesophageal reflux disease)     takes Prilosec daily  . Blood  transfusion     as a child  . Glaucoma(365)     hx of  . Depression     takes Prozac daily  . Insomnia     takes Trazodone nightly  . Anginal pain   . Chronic kidney disease     acute kidney failure post surgery  . Pneumonia     hx of' 61 yo,rd' last time in 2012  . Type II diabetes mellitus     takes Metformin bid and Novolog and Lantus daily  . Arthritis     "hands"   Chart Review  Recent Office visit Dr. 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 - s/p lobectomy  Hx of renal  failure and persistent hypomagnesmia - onset May 2013 following lobectomy -> renal failure resolved - Oct 2013 - persistent hypomag being followed by Dr Lajuana Carry  status post right thoracotomy with right upper lobectomy and mediastinal lymph node dissection on 03/17/2012 for a T1a, 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 LAD with mid- vessel calcifications, and a 50% lucent stenosis, circumflex segmental 50-60% mid stenosis, RCA is found with a patent distal stent. Moderate LAD and circumflex lesions, but these were IVUS'ed and no critical lesion was noted  S/p  DES stent to RCA in 2006  Admission for ETOH intox 09/05/2004   Past Surgical History  Procedure Date  . Carpal tunnel release     bilateral  . Rotator cuff repair     left  . Colonoscopy   . Esophagogastroduodenoscopy   . Eye surgery   . Lobectomy 03/17/2012    upper right side with resection  . Elbow surgery     ulnar nerve; left  . Cardiac catheterization 2006/2008/2009  . Coronary angioplasty with stent placement     1 stent  . Refractive surgery     bilaterally  . Inguinal hernia repair 1990's    double inguinal   Social History:  reports that he has been smoking Cigarettes.  He has a 45 pack-year smoking history. He has never used smokeless tobacco. He reports that he drinks alcohol. He reports that he uses illicit drugs (Cocaine). History   Social History  . Marital Status: Married    Spouse Name: N/A    Number of Children: N/A  . Years of Education: N/A   Social History Main Topics  . Smoking status: Current Some Day Smoker -- 1.0 packs/day for 45 years    Types: Cigarettes    Last Attempt to Quit:  03/26/2012  . Smokeless tobacco: Never Used     Comment: reports still smoking 3 cigs a day 05-01-12  . Alcohol Use: Yes     Comment: drinking a gallon a day-smokes 2 electronic cigs a week  . Drug Use: Yes    Special: Cocaine     Comment: quit 1990's  . Sexually Active: Not Currently   Other Topics Concern  .  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 Demerol (Meperidine) Other (See Comments)    Reaction unknown    Family History  Problem Relation Age of Onset  . COPD Mother   . Heart attack Father   . Anesthesia problems Neg Hx   . Hypotension Neg Hx   . Malignant hyperthermia Neg Hx   . Pseudochol deficiency Neg Hx     Prior to Admission medications   Medication Sig Start Date End Date Taking? Authorizing Provider  acetaminophen (TYLENOL) 500 MG tablet Take 1,000 mg by mouth every 6 (six) hours as needed. For pain   Yes Historical Provider, MD  albuterol (PROVENTIL) (2.5 MG/3ML) 0.083% nebulizer solution Take 2.5 mg by nebulization every 4 (four) hours as needed. For shortness of breath   Yes Historical Provider, MD  aspirin EC 81 MG tablet Take 81 mg by mouth daily.   Yes Historical Provider, MD  atorvastatin (LIPITOR) 20 MG tablet Take 20 mg by mouth daily.   Yes Historical Provider, MD  beclomethasone (QVAR) 80 MCG/ACT inhaler Inhale 2 puffs into the lungs 2 (two) times daily as needed. For shortness of breath 04/25/12 04/25/13 Yes William S Minor, NP  calcium carbonate (OS-CAL - DOSED IN MG OF ELEMENTAL CALCIUM) 1250 MG tablet Take 1 tablet by mouth daily. 04/25/12 04/25/13 Yes William S Minor, NP  diltiazem (CARDIZEM CD) 120 MG 24 hr capsule Take 120 mg by mouth daily. 04/25/12 04/25/13 Yes William S Minor, NP  FLUoxetine (PROZAC) 40 MG capsule Take 40 mg by mouth daily.     Yes Historical Provider, MD    gabapentin (NEURONTIN) 300 MG capsule Take 300 mg by mouth 3 (three) times daily. 04/25/12 04/25/13 Yes William S Minor, NP  insulin aspart (NOVOLOG) 100 UNIT/ML injection Inject 10-25 Units into the skin 3 (three) times daily before meals. Home sliding scale; >140 = 10 units up to 25 units   Yes Historical Provider, MD  insulin glargine (LANTUS) 100 UNIT/ML injection Inject 80 Units into the skin every morning.  09/22/11  Yes Srikar Cherlynn Kaiser, MD  levalbuterol Pauline Aus) 0.63 MG/3ML nebulizer solution Take 1 ampule by nebulization every 4 (four) hours as needed. For shortness of breath 04/25/12 04/25/13 Yes Vilinda Blanks Minor, NP  Magnesium 250 MG TABS Take 500 mg by mouth 3 (three) times daily.   Yes Historical Provider, MD  Multiple Vitamin (MULTIVITAMIN WITH MINERALS) TABS Take 1 tablet by mouth daily.   Yes Historical Provider, MD  pantoprazole (PROTONIX) 40 MG tablet Take 40 mg by mouth daily.   Yes Historical Provider, MD  Phenyleph-CPM-DM-APAP (ALKA-SELTZER PLUS COLD & FLU PO) Take 1 packet by mouth daily as needed. For cold/flu symptoms (cough, congestion, etc.)   Yes Historical Provider, MD  thiamine (VITAMIN B-1) 100 MG tablet Take 100 mg by mouth daily.   Yes Historical Provider, MD  tiotropium (SPIRIVA) 18 MCG inhalation capsule Place 18 mcg into inhaler and inhale daily.     Yes Historical Provider, MD  traZODone (DESYREL) 100 MG tablet Take 100 mg by mouth at bedtime.     Yes Historical Provider, MD   Physical Exam: Filed Vitals:   09/21/12 0817 09/21/12 0903 09/21/12 1024 09/21/12 1045  BP: 105/50  92/37   Pulse: 97  114  Temp: 98.3 F (36.8 C)   98.6 F (37 C)  TempSrc: Oral   Axillary  Resp: 20  20   SpO2: 93% 91% 92%      General:  Alert pleasant obese Caucasian male in mild respiratory distress, classic blue bloater habitus  Eyes: No pallor or icterus  ENT: Soft supple cannot assess JVD  Neck: No thyromegaly  Cardiovascular: S1-S2 tachycardic, seems to be sinus tach,  displaced PMI  Respiratory: Coarse breath sounds bilaterally all over lung fields with no tactile vocal resonance or fremitus  Abdomen: Soft nontender  Skin: No lower extremity pitting edema  Musculoskeletal: Good range of motion bilaterally  Psychiatric: Euthymic  Neurologic: grossly normal with no obvious deficits  Labs on Admission:  Basic Metabolic Panel:  Lab 09/21/12 1610  NA 138  K 3.6  CL 95*  CO2 31  GLUCOSE 192*  BUN 15  CREATININE 0.95  CALCIUM 8.6  MG --  PHOS --   Liver Function Tests: No results found for this basename: AST:5,ALT:5,ALKPHOS:5,BILITOT:5,PROT:5,ALBUMIN:5 in the last 168 hours No results found for this basename: LIPASE:5,AMYLASE:5 in the last 168 hours No results found for this basename: AMMONIA:5 in the last 168 hours CBC:  Lab 09/21/12 0915  WBC 14.1*  NEUTROABS --  HGB 11.0*  HCT 32.3*  MCV 86.8  PLT 203   Cardiac Enzymes: No results found for this basename: CKTOTAL:5,CKMB:5,CKMBINDEX:5,TROPONINI:5 in the last 168 hours  BNP (last 3 results)  Basename 04/23/12 1315 04/03/12 0550  PROBNP 501.5* 368.7*   CBG: No results found for this basename: GLUCAP:5 in the last 168 hours  Radiological Exams on Admission: Dg Chest 2 View  09/21/2012  *RADIOLOGY REPORT*  Clinical Data: Shortness of breath, cough  CHEST - 2 VIEW  Comparison: 07/22/2012  Findings: Postsurgical changes in the right hemithorax with volume loss and tenting of the right hemidiaphragm.  Chronic interstitial markings/emphysematous changes.  No focal consolidation.  Stable borderline cardiomegaly.  Degenerative changes of the visualized thoracolumbar spine.  IMPRESSION: Postsurgical changes in the right hemithorax.  Chronic interstitial markings/emphysematous changes.   Original Report Authenticated By: Charline Bills, M.D.     EKG: Independently reviewed. Sinus tach  Assessment/Plan Principal Problem:  *Viral upper respiratory illness Active Problems:  C O P  D  Sleep apnea  Acute respiratory failure with hypoxia  Hypokalemia  S/P thoracotomy  S/P lobectomy of lung  Hypocalcemia   1. Likely viral upper respiratory illness-patient has recently had a flu shot therefore this may be less likely. Whether this is after ferrous person oral virus remains to be determined-his grandson was sick a week ago and his wife also has a similar illness. Given that the patient is coughing and has purulent sputum, this may be secondarily infected and his chest x-ray probably is just lagging behind his clinical symptoms. I will culture his sputum keep him on broad-spectrum antibiotics at present time including ceftriaxone, Levaquin and vancomycin for community-acquired pneumonia, ICU-I will get respiratory virus antigen panel as well 2. Potential pneumonia-see above  3. Acute respiratory failure (type 1) in a setting of multiple lung comorbidities-monitor on step down overnight. Patient was hypoxic to the 80s on admission. He will need 24-hour oxygen and we will attempt to ambulate him and see if he has an increased oxygen requirement in the morning 4. Known history T1 a, N0, M0 stage IA non-small cell lung cancer status post lobectomy 03/17/2012-improved surveillance recommended per his thoracic surgeon Dr. Laneta Simmers in December of this year. 5. History  of coronary artery disease, status post drug-eluting stent >>RCA 2006-stable at this time. EKG and troponins are negative. 6. Moderate to severe COPD-follows with Dr. Marchelle Gearing pulmonology. He has been given one dose of steroids 60 mg prednisone in the emergency room-he will require round-the-clock ipratropium nebs, albuterol nebs, and likely would benefit from continued steroids-prednisone 60 at present time. 7. H/o OSA-will ask RT to schedule CPAP at night 8. Sinus tachycardia-probably related to albuterol use.  Monitor 9. Diabetes mellitus-will place patient on 60 units of Lantus as opposed to his home dosage of 80. He has  not been hungry he states. Continue sliding scale insulin 10. Dyselectrolytemia-replace magnesium, check phosphorus-will need nephrology input as an outpatient--? Could this be some strange result of the lung cancer? 11. Diarrhea-likely viral induced-low suspicion at this time for C. difficile colitis versus other forms of diarrhea. Will not place on contact precautions secondary to this. If this persists, would consider workup. 12. DVT prophylaxis and PPI prophylaxis ordered he    Code Status: Full  Family Communication: NOne at bedside  Disposition Plan: SDu for 24 hours  KEEP ON TEAM 7   Time spent: 102  Mahala Menghini [facility] Pager [phone]  If 7PM-7AM, please contact night-coverage [email or web address] Password [email or web address] 09/21/2012, 11:15 AM

## 2012-09-21 NOTE — ED Provider Notes (Signed)
History     CSN: [id number]  Arrival date & time 09/21/12  0804   First MD Initiated Contact with Patient 09/21/12 [phone]      Chief Complaint  Patient presents with  . Cough  . Shortness of Breath  . Chest Pain    (Consider location/radiation/quality/duration/timing/severity/associated sxs/prior treatment) HPI Comments: 61 year old male with a history of CKD,  CAD (last cath in 09), COPD with bullous emphysema, NSCLC (lobectomy may 2013 complicated by ICU course for air leak and ATN), and type 2 diabetes presents emergency department complaining of cough, shortness of breath, congestion and chest pain.  Onset of symptoms began approximately 3 days ago.  Patient's cough is productive with change in sputum that is both copious and green.  Chest pain is located in the midsternal region and worsened with coughing, but non-positional, exertional, or reproducible.  Associated symptoms include shaking chills, but patient is on aware afebrile because he has not taken his temperature.  Patient reports that he has oxygen at home but only requires it at night but in the last day has needed 2 L nasal cannula in the day to keep his oxygen sat in the low 90s.  Last hospital stay was 6/19 through 6/21 for electrolyte imbalance (hypomagnesemia, hypokalemia, and hypocalcemia).    PCP- Dr. Tanya Nones Oncology- Dr. Sherie Don Renal- Dr. Hyman Hopes Cardiology- Dr. Katrinka Blazing Electrolyte Imbalance Dr. Daphine Deutscher  Patient is a 61 y.o. male presenting with cough, shortness of breath, and chest pain. The history is provided by the patient.  Cough Associated symptoms include chest pain, chills, shortness of breath and wheezing. Pertinent negatives include no headaches and no sore throat.  Shortness of Breath  Associated symptoms include chest pain, cough, shortness of breath and wheezing. Pertinent negatives include no sore throat and no stridor.  Chest Pain Primary symptoms include shortness of breath, cough and wheezing.  Pertinent negatives for primary symptoms include no palpitations, no abdominal pain, no nausea, no vomiting and no dizziness.  Pertinent negatives for associated symptoms include no numbness and no weakness.     Past Medical History  Diagnosis Date  . Anemia   . Allergy   . Thyroid disease   . Parathyroid disease   . Headache   . Hypertension     takes  HYzaar daily  . Coronary artery disease     has 1 stent  . Myocardial infarct   . Asthma   . Emphysema     sees Dr.Ramaswami for this  . COPD (chronic obstructive pulmonary disease)     uses Albuterol and Spiriva daily  . Shortness of breath     with exertion   . Bronchitis   . Productive cough     white in color but no odor  . Sleep apnea     uses BiPaP  . Peripheral neuropathy   . Lung mass     right upper lobe  . Back pain     4 deteriorating disc and receives an injection q3-5mon;has been doing this for about 3yrs  . H/O hiatal hernia   . GERD (gastroesophageal reflux disease)     takes Prilosec daily  . Blood transfusion     as a child  . Glaucoma(365)     hx of  . Depression     takes Prozac daily  . Insomnia     takes Trazodone nightly  . Anginal pain   . Chronic kidney disease     acute kidney failure post surgery  . Pneumonia  hx of' 61 yo,rd' last time in 2012  . Type II diabetes mellitus     takes Metformin bid and Novolog and Lantus daily  . Arthritis     "hands"    Past Surgical History  Procedure Date  . Carpal tunnel release     bilateral  . Rotator cuff repair     left  . Colonoscopy   . Esophagogastroduodenoscopy   . Eye surgery   . Lobectomy 03/17/2012    upper right side with resection  . Elbow surgery     ulnar nerve; left  . Cardiac catheterization 2006/2008/2009  . Coronary angioplasty with stent placement     1 stent  . Refractive surgery     bilaterally  . Inguinal hernia repair 1990's    double inguinal    Family History  Problem Relation Age of Onset  . COPD  Mother   . Heart attack Father   . Anesthesia problems Neg Hx   . Hypotension Neg Hx   . Malignant hyperthermia Neg Hx   . Pseudochol deficiency Neg Hx     History  Substance Use Topics  . Smoking status: Current Some Day Smoker -- 1.0 packs/day for 45 years    Types: Cigarettes    Last Attempt to Quit: 03/26/2012  . Smokeless tobacco: Never Used     Comment: reports still smoking 3 cigs a day 05-01-12  . Alcohol Use: Yes     Comment: 04/23/12 quit 4 months ago and was drinking a gallon a day      Review of Systems  Constitutional: Positive for chills and activity change. Negative for appetite change.  HENT: Negative for congestion, sore throat, trouble swallowing, neck pain, neck stiffness and voice change.   Eyes: Negative for visual disturbance.  Respiratory: Positive for cough, chest tightness, shortness of breath and wheezing. Negative for choking and stridor.   Cardiovascular: Positive for chest pain. Negative for palpitations and leg swelling.  Gastrointestinal: Negative for nausea, vomiting, abdominal pain and diarrhea.  Genitourinary: Negative for dysuria, urgency and frequency.  Skin: Negative for rash.  Neurological: Negative for dizziness, syncope, weakness, light-headedness, numbness and headaches.  Psychiatric/Behavioral: Negative for confusion.  All other systems reviewed and are negative.    Allergies  Demerol  Home Medications   Current Outpatient Rx  Name  Route  Sig  Dispense  Refill  . ACETAMINOPHEN 500 MG PO TABS   Oral   Take 1,000 mg by mouth every 6 (six) hours as needed. For pain         . ALBUTEROL SULFATE (2.5 MG/3ML) 0.083% IN NEBU   Nebulization   Take 2.5 mg by nebulization every 4 (four) hours as needed. For shortness of breath         . ASPIRIN EC 81 MG PO TBEC   Oral   Take 81 mg by mouth daily.         . ATORVASTATIN CALCIUM 20 MG PO TABS   Oral   Take 20 mg by mouth daily.         . BECLOMETHASONE DIPROPIONATE 80  MCG/ACT IN AERS   Inhalation   Inhale 2 puffs into the lungs 2 (two) times daily as needed. For shortness of breath         . CALCIUM CARBONATE 1250 MG PO TABS   Oral   Take 1 tablet by mouth daily.         Marland Kitchen DILTIAZEM HCL ER COATED BEADS 120 MG PO  CP24   Oral   Take 120 mg by mouth daily.         Marland Kitchen FLUOXETINE HCL 40 MG PO CAPS   Oral   Take 40 mg by mouth daily.           Marland Kitchen GABAPENTIN 300 MG PO CAPS   Oral   Take 300 mg by mouth 3 (three) times daily.         . INSULIN ASPART 100 UNIT/ML [place] SOLN   Subcutaneous   Inject 10-25 Units into the skin 3 (three) times daily before meals. Home sliding scale; >140 = 10 units up to 25 units         . INSULIN GLARGINE 100 UNIT/ML [place] SOLN   Subcutaneous   Inject 80 Units into the skin every morning.          Marland Kitchen LEVALBUTEROL HCL 0.63 MG/3ML IN NEBU   Nebulization   Take 1 ampule by nebulization every 4 (four) hours as needed. For shortness of breath         . MAGNESIUM 250 MG PO TABS   Oral   Take 500 mg by mouth 3 (three) times daily.         . ADULT MULTIVITAMIN W/MINERALS CH   Oral   Take 1 tablet by mouth daily.         Marland Kitchen PANTOPRAZOLE SODIUM 40 MG PO TBEC   Oral   Take 40 mg by mouth daily.         Marland Kitchen ALKA-SELTZER PLUS COLD & FLU PO   Oral   Take 1 packet by mouth daily as needed. For cold/flu symptoms (cough, congestion, etc.)         . VITAMIN B-1 100 MG PO TABS   Oral   Take 100 mg by mouth daily.         Marland Kitchen TIOTROPIUM BROMIDE MONOHYDRATE 18 MCG IN CAPS   Inhalation   Place 18 mcg into inhaler and inhale daily.           . TRAZODONE HCL 100 MG PO TABS   Oral   Take 100 mg by mouth at bedtime.             BP 92/37  Pulse 114  Temp 98.3 F (36.8 C) (Oral)  Resp 20  SpO2 92%  Physical Exam  Nursing note and vitals reviewed. Constitutional: He is oriented to person, place, and time. He appears well-developed and well-nourished.  HENT:  Head: Normocephalic and atraumatic.        Oropharynx clear and moist   Eyes: Conjunctivae normal and EOM are normal.  Neck: Normal range of motion. Neck supple.       No lymphadenopathy,  JVD or stridor  Cardiovascular:       RRR. No aberrancy on auscultation. Intact distal pulses. No edema  Pulmonary/Chest: Effort normal.       Respiratory distress w accessory muscle use, but no nasal flaring. Pt able to speak full sentences. Rhonchi and wheezing heard throughout. Hypoxic w O2 sat 88 on RA  Abdominal:       Obese abdomen, non tender  Musculoskeletal: Normal range of motion.  Neurological: He is alert and oriented to person, place, and time.  Skin: Skin is warm and dry. No rash noted. He is not diaphoretic.  Psychiatric: He has a normal mood and affect. His behavior is normal.    ED Course  Procedures (including critical care time)  Labs Reviewed  CBC -  Abnormal; Notable for the following:    WBC 14.1 (*)     RBC 3.72 (*)     Hemoglobin 11.0 (*)     HCT 32.3 (*)     All other components within normal limits  BASIC METABOLIC PANEL - Abnormal; Notable for the following:    Chloride 95 (*)     Glucose, Bld 192 (*)     GFR calc non Af Amer 87 (*)     All other components within normal limits  POCT I-STAT TROPONIN I  URINALYSIS, ROUTINE W REFLEX MICROSCOPIC   Dg Chest 2 View  09/21/2012  *RADIOLOGY REPORT*  Clinical Data: Shortness of breath, cough  CHEST - 2 VIEW  Comparison: 07/22/2012  Findings: Postsurgical changes in the right hemithorax with volume loss and tenting of the right hemidiaphragm.  Chronic interstitial markings/emphysematous changes.  No focal consolidation.  Stable borderline cardiomegaly.  Degenerative changes of the visualized thoracolumbar spine.  IMPRESSION: Postsurgical changes in the right hemithorax.  Chronic interstitial markings/emphysematous changes.   Original Report Authenticated By: Charline Bills, M.D.      No diagnosis found.   Date: 09/21/2012  Rate: 97  Rhythm: normal sinus  rhythm  QRS Axis: normal  Intervals: normal  ST/T Wave abnormalities: normal  Conduction Disutrbances:none  Narrative Interpretation: atrial premature complexes  Old EKG Reviewed: unchanged  10:38 AM Pt tachycardic w drop in his pressure. 2 L bolus & lactic acid ordered. Pt reevaluated and lung auscultation positive for inspiratory wheezes adn expiratory rhonchi throughout. No significant improvement after hour long nebulizer tx. Nurse to do Rectal temp. Normal mentation.   MDM  COPD exacerbation & hypoxia  61 year old male with a history of diabetes, COPD, CAD, and NSCLC (lobectomy in May 2013) presented to the emergency department with a chief complaint of cough, shortness of breath, and chest tightness.  Symptoms have been gradually worsening over the last 3 days.  No acute infiltrate seen on x-ray.  Patient hypoxic in the emergency department with oxygen saturation at 88 on room air. Labs & imaging reviewed. NO acute infiltrate. IV doxy started. The patient appears reasonably stabilized for admission considering the current resources, flow, and capabilities available in the ED at this time, and I doubt any other [facility] requiring further screening and/or treatment in the ED prior to admission.        Jaci Carrel, [place] 09/21/12 1334

## 2012-09-21 NOTE — ED Notes (Signed)
Continuous nebulizer treatment complete. Pt resting quietly at the time. Vital signs stable. Family remains at bedside. No signs of acute distress noted at present.

## 2012-09-22 ENCOUNTER — Inpatient Hospital Stay (HOSPITAL_COMMUNITY): Payer: Managed Care, Other (non HMO)

## 2012-09-22 ENCOUNTER — Telehealth: Payer: Self-pay | Admitting: Internal Medicine

## 2012-09-22 DIAGNOSIS — N179 Acute kidney failure, unspecified: Secondary | ICD-10-CM

## 2012-09-22 DIAGNOSIS — D72829 Elevated white blood cell count, unspecified: Secondary | ICD-10-CM | POA: Diagnosis present

## 2012-09-22 DIAGNOSIS — J9600 Acute respiratory failure, unspecified whether with hypoxia or hypercapnia: Principal | ICD-10-CM

## 2012-09-22 DIAGNOSIS — C3490 Malignant neoplasm of unspecified part of unspecified bronchus or lung: Secondary | ICD-10-CM | POA: Diagnosis present

## 2012-09-22 DIAGNOSIS — E869 Volume depletion, unspecified: Secondary | ICD-10-CM

## 2012-09-22 LAB — CBC WITH DIFFERENTIAL/PLATELET
Eosinophils Absolute: 0 10*3/uL (ref 0.0–0.7)
Eosinophils Relative: 0 % (ref 0–5)
HCT: 30.5 % — ABNORMAL LOW (ref 39.0–52.0)
Lymphs Abs: 1.7 10*3/uL (ref 0.7–4.0)
MCH: 29.3 pg (ref 26.0–34.0)
MCV: 85.9 fL (ref 78.0–100.0)
Monocytes Absolute: 1 10*3/uL (ref 0.1–1.0)
Platelets: 231 10*3/uL (ref 150–400)
RBC: 3.55 MIL/uL — ABNORMAL LOW (ref 4.22–5.81)
RDW: 14.3 % (ref 11.5–15.5)

## 2012-09-22 LAB — COMPREHENSIVE METABOLIC PANEL
AST: 24 U/L (ref 0–37)
Albumin: 2.8 g/dL — ABNORMAL LOW (ref 3.5–5.2)
Alkaline Phosphatase: 107 U/L (ref 39–117)
Chloride: 97 mEq/L (ref 96–112)
Potassium: 3.2 mEq/L — ABNORMAL LOW (ref 3.5–5.1)
Total Bilirubin: 0.2 mg/dL — ABNORMAL LOW (ref 0.3–1.2)
Total Protein: 6.8 g/dL (ref 6.0–8.3)

## 2012-09-22 LAB — GLUCOSE, CAPILLARY
Glucose-Capillary: 184 mg/dL — ABNORMAL HIGH (ref 70–99)
Glucose-Capillary: 255 mg/dL — ABNORMAL HIGH (ref 70–99)

## 2012-09-22 LAB — RESPIRATORY VIRUS PANEL
Influenza B: NOT DETECTED
Metapneumovirus: NOT DETECTED
Parainfluenza 1: NOT DETECTED
Parainfluenza 2: NOT DETECTED
Parainfluenza 3: NOT DETECTED

## 2012-09-22 LAB — LEGIONELLA ANTIGEN, URINE: Legionella Antigen, Urine: NEGATIVE

## 2012-09-22 LAB — INFLUENZA PANEL BY PCR (TYPE A & B): H1N1 flu by pcr: NOT DETECTED

## 2012-09-22 MED ORDER — ACETAMINOPHEN 325 MG PO TABS: 650.0000 mg | ORAL_TABLET | Freq: Four times a day (QID) | ORAL | Status: DC | PRN

## 2012-09-22 MED ORDER — ALBUTEROL SULFATE (5 MG/ML) 0.5% IN NEBU
2.5000 mg | INHALATION_SOLUTION | Freq: Four times a day (QID) | RESPIRATORY_TRACT | Status: DC
  Administered 2012-09-22 – 2012-09-24 (×6): 2.5 mg via RESPIRATORY_TRACT
  Filled 2012-09-22 (×7): qty 0.5

## 2012-09-22 MED ORDER — FAMOTIDINE 40 MG PO TABS
40.0000 mg | ORAL_TABLET | Freq: Every day | ORAL | Status: DC
  Administered 2012-09-22 – 2012-09-24 (×3): 40 mg via ORAL
  Filled 2012-09-22 (×3): qty 1

## 2012-09-22 MED ORDER — ALBUTEROL SULFATE (5 MG/ML) 0.5% IN NEBU: 2.5000 mg | INHALATION_SOLUTION | RESPIRATORY_TRACT | Status: DC | PRN

## 2012-09-22 MED ORDER — OXYCODONE HCL 5 MG PO TABS: 5.0000 mg | ORAL_TABLET | ORAL | Status: DC | PRN

## 2012-09-22 MED ORDER — POTASSIUM CHLORIDE CRYS ER 20 MEQ PO TBCR
40.0000 meq | EXTENDED_RELEASE_TABLET | Freq: Two times a day (BID) | ORAL | Status: AC
  Administered 2012-09-22 – 2012-09-23 (×3): 40 meq via ORAL
  Filled 2012-09-22 (×3): qty 2

## 2012-09-22 NOTE — Telephone Encounter (Signed)
Please let wife know that dr Mahala Menghini the hostpialist called me last night and we discussed. The magnesium level continues to be low and is puzzling; I have told Dr Mahala Menghini to address that. I am not in hospital rotation this week but will be in elink and will keep track

## 2012-09-22 NOTE — Telephone Encounter (Signed)
I spoke with spouse and she was calling to let MR know that pt is over at [facility] ICU in isolation. She stated he was admitted yesterday. She called 911 bc he was very congested and SOB. They are waiting for cultures to come back bc they are thinking it is viral. Wife just wanted to make MR aware.

## 2012-09-22 NOTE — Progress Notes (Signed)
Pt txing to 6N, report called to RN and pt moved in [facility], he notified his family - Melina [employer]

## 2012-09-22 NOTE — Evaluation (Signed)
Occupational Therapy Evaluation Patient Details Name: [name] MRN: [id number] DOB: Jun 02, 1951 Today's Date: 09/22/2012 Time: 4098-1191 OT Time Calculation (min): 15 min  OT Assessment / Plan / Recommendation Clinical Impression  Pt admitted with DOE, anorexia, diarrhea felt to be possibly related to viral upper respiratory illness. Pt does have h/o COPD as well as prior lobectomy for non-small cell carcinoma in May 2013. Pt presents as generally weak and continues to experience DOE requiring 02 for any activity. Pt will benefit from skilled OT in the acute setting to maximize I with ADL and ADL mobility prior to d/c. Recommend HHOT for d/c plan pending pt's progress    OT Assessment  Patient needs continued OT Services    Follow Up Recommendations  Home health OT;Supervision - Intermittent    Barriers to Discharge      Equipment Recommendations   (TBD)    Recommendations for Other Services    Frequency  Min 2X/week    Precautions / Restrictions Precautions Precautions: Fall (02) Restrictions Weight Bearing Restrictions: No   Pertinent Vitals/Pain Pt denies any pain but does exhibit dyspnea at rest.    ADL  Grooming: Min guard;Wash/dry hands;Wash/dry face Where Assessed - Grooming: Unsupported standing Lower Body Bathing: Min guard Where Assessed - Lower Body Bathing: Unsupported sit to stand Upper Body Dressing: Set up Where Assessed - Upper Body Dressing: Supported sitting Lower Body Dressing: Minimal assistance Where Assessed - Lower Body Dressing: Unsupported sit to stand Toilet Transfer: Min Pension scheme manager Method: Sit to Barista: Materials engineer and Hygiene: Min guard Where Assessed - Engineer, mining and Hygiene: Standing Equipment Used: Gait belt Transfers/Ambulation Related to ADLs: Min HHA with ambulation; pt with dyspnea at rest which worsens with activity  ADL Comments:  Educated pt on energy conservation techniques- no handout provided as none available at time of eval. Pt with dyspnea at rest.     OT Diagnosis: Generalized weakness  OT Problem List: Decreased strength;Decreased activity tolerance;Impaired balance (sitting and/or standing);Decreased knowledge of use of DME or AE;Cardiopulmonary status limiting activity OT Treatment Interventions: Self-care/ADL training;DME and/or AE instruction;Energy conservation;Therapeutic activities;Patient/family education;Balance training   OT Goals Acute Rehab OT Goals OT Goal Formulation: With patient Time For Goal Achievement: 09/29/12 Potential to Achieve Goals: Good ADL Goals Pt Will Perform Grooming: Independently;Standing at sink;Sitting at sink ADL Goal: Grooming - Progress: Goal set today Pt Will Perform Upper Body Dressing: Independently;Sitting, chair;Sitting, bed ADL Goal: Upper Body Dressing - Progress: Goal set today Pt Will Perform Lower Body Dressing: Independently;Sitting, chair;Sitting, bed ADL Goal: Lower Body Dressing - Progress: Goal set today Pt Will Transfer to Toilet: with modified independence;Ambulation;with DME ADL Goal: Toilet Transfer - Progress: Goal set today Pt Will Perform Toileting - Clothing Manipulation: Independently;Standing ADL Goal: Toileting - Clothing Manipulation - Progress: Goal set today Pt Will Perform Tub/Shower Transfer: Shower transfer;Ambulation;with DME ADL Goal: Tub/Shower Transfer - Progress: Goal set today Additional ADL Goal #1: Pt will verbalize/generalize 3/3 energy conservation techniques for use during functional tasks. ADL Goal: Additional Goal #1 - Progress: Goal set today  Visit Information  Last OT Received On: 09/22/12 Assistance Needed: +1    Subjective Data  Subjective: I'm doing OK, just can't breathe well Patient Stated Goal: Return home   Prior Functioning     Home Living Lives With: Spouse Available Help at Discharge: Available  PRN/intermittently;Family Type of Home: House Bathroom Shower/Tub: Walk-in shower;Tub/shower Building services engineer  with back Prior Function Level of Independence: Independent Driving: Yes Vocation: Retired Musician: No difficulties Dominant Hand: Right         Vision/Perception     Cognition  Overall Cognitive Status: Appears within functional limits for tasks assessed/performed Arousal/Alertness: Awake/alert Orientation Level: Appears intact for tasks assessed Behavior During Session: [facility] for tasks performed    Extremity/Trunk Assessment Right Upper Extremity Assessment RUE ROM/Strength/Tone: WFL for tasks assessed RUE Sensation: WFL - Light Touch RUE Coordination: WFL - gross/fine motor Left Upper Extremity Assessment LUE ROM/Strength/Tone: WFL for tasks assessed LUE Sensation: WFL - Light Touch LUE Coordination: WFL - gross/fine motor                           End of Session OT - End of Session Equipment Utilized During Treatment: Gait belt Activity Tolerance:  (limited by SOB) Patient left: in chair ((w/c going to new floor assignment)) Nurse Communication: Mobility status  GO     Jaleal Schliep 09/22/2012, 3:42 PM

## 2012-09-22 NOTE — Progress Notes (Signed)
RT Note: Placed pt on Auto CPAP with 3L 02 bleed in and full face mask. Pt tolerating well. RT and RN will continue to monitor.

## 2012-09-22 NOTE — Telephone Encounter (Signed)
i spoke with spouse and is aware of MR response. She was very happy to know this. Nothing further was needed

## 2012-09-22 NOTE — Progress Notes (Addendum)
[facility] Progress Note [place] TEAM 1 - Stepdown/ICU TEAM   [name] JXB:[id number] DOB: August 25, 1951 DOA: 09/21/2012 PCP: Leo Grosser, MD  Brief narrative: 61 year old male patient with known history of COPD as well as prior lobectomy for non-small cell carcinoma in May 2013. He presented to the emergency part with complaints of coughing x3 days with productive green sputum. Symptoms initially began after he had been working outside and was exposed to freshly mowed grass and old leaves. Later he developed diarrhea. Eventually he progressed to experiencing shortness of breath that increased to the point he felt he needed to seek medical attention. He normally has nocturnal oxygen to use with his CPAP for sleep apnea. In addition he was having anorexia. Most limiting symptom was new dyspnea on exertion. He had been exposed to his grandson who had what sounds like a viral illness. Upon evaluation in the emergency department he was tachypneic and had relative hypotension that responded to IV fluid administration. An ABG had been performed it demonstrated possible hypoxemia with a PO2 of 57 but review of room air ABGs from 2013 revealed a baseline PO2 between 50 and 60. He did have mild leukocytosis. Lactic acid was normal at 1.5. It was felt the patient had a viral respiratory illness and was subsequently admitted to the step down unit for further monitoring and treatment.  Assessment/Plan:   *Acute respiratory failure with hypoxia due to suspected  Viral upper respiratory illness *Remain stable on nasal cannula oxygen *Viral panels including influenza pending *Continue droplet isolation *Continue antibiotics to treat for possible tracheobronchitis  Leukocytosis *Does not appear to have acute bacterial infection based on chest x-ray findings *Leukocytosis primarily appears to be related to volume depletion  i.e. hemoconcentration   Non-small cell carcinoma of lung, stage  1 S/P lobectomy of lung *Review of plain x-rays show no evidence of recurrence of lung mass *Patient has followup CT scan pending in December   Hypertension *Blood pressure currently well-controlled *Continue diltiazem   DM type 2 (diabetes mellitus, type 2) *CBGs initially were poorly controlled and greater than 300 but are improving now *Continue sliding scale insulin *Persistent elevations in glucose can contribute to low magnesium level   Hypokalemia *Oral replete and follow electrolyte panel   C O P D/Sleep apnea *Not clinically exacerbated on exam *Continue nebulizer treatments and usual home and inhalers   GERD (gastroesophageal reflux disease) *Continue home Protonix   Hypomagnesemia-chronic *Magnesium level very low at 1.1 which is chronic *Continue oral repletion *d/c PPI (a common cause of hypo-Mg) and follow  DVT prophylaxis: Lovenox Code Status: Full Family Communication: Spoke with the patient Disposition Plan: Transfer to floor  Consultants: None  Procedures: None  Antibiotics: Rocephin 11/18 >>> Levaquin 11/18 >>> Vancomycin 11/18 >>>  HPI/Subjective: Patient alert and sitting up in chair without any specific complaints.   Objective: Blood pressure 101/74, pulse 94, temperature 98.1 F (36.7 C), temperature source Oral, resp. rate 29, height 5' 10.87" (1.8 m), weight 130.4 kg (287 lb 7.7 oz), SpO2 95.00%.  Intake/Output Summary (Last 24 hours) at 09/22/12 1413 Last data filed at 09/22/12 1211  Gross per 24 hour  Intake   3045 ml  Output   4000 ml  Net   -955 ml     Exam: General: No acute respiratory distress Lungs: Clear to auscultation bilaterally without wheezes or crackles, nasal cannula oxygen Cardiovascular: Regular rate and rhythm without murmur gallop or rub normal S1 and S2 Abdomen: Nontender, nondistended, soft, bowel  sounds positive, no rebound, no ascites, no appreciable mass Extremities: No significant cyanosis, clubbing,  or edema bilateral lower extremities  Data Reviewed: Basic Metabolic Panel:  Lab 09/22/12 1610 09/21/12 1453 09/21/12 1030 09/21/12 0915  NA 138 -- -- 138  K 3.2* -- -- 3.6  CL 97 -- -- 95*  CO2 26 -- -- 31  GLUCOSE 190* -- -- 192*  BUN 15 -- -- 15  CREATININE 0.88 0.88 -- 0.95  CALCIUM 8.2* -- -- 8.6  MG -- -- 1.1* --  PHOS -- 3.3 -- --   Liver Function Tests:  Lab 09/22/12 0410  AST 24  ALT 21  ALKPHOS 107  BILITOT 0.2*  PROT 6.8  ALBUMIN 2.8*   CBC:  Lab 09/22/12 0410 09/21/12 1453 09/21/12 0915  WBC 14.0* 14.4* 14.1*  NEUTROABS 11.2* -- --  HGB 10.4* 11.1* 11.0*  HCT 30.5* 33.4* 32.3*  MCV 85.9 87.2 86.8  PLT 231 241 203    CBG:  Lab 09/22/12 1209 09/22/12 0759 09/21/12 2145 09/21/12 1706 09/21/12 1414  GLUCAP 184* 143* 334* 360* 291*    Recent Results (from the past 240 hour(s))  MRSA PCR SCREENING     Status: Normal   Collection Time   09/21/12  2:34 PM      Component Value Range Status Comment   MRSA by PCR NEGATIVE  NEGATIVE Final   CULTURE, BLOOD (ROUTINE X 2)     Status: Normal (Preliminary result)   Collection Time   09/21/12  3:01 PM      Component Value Range Status Comment   Specimen Description BLOOD RIGHT ARM   Final    Special Requests BOTTLES DRAWN AEROBIC AND ANAEROBIC 10CC   Final    Culture  Setup Time 09/21/2012 23:40   Final    Culture     Final    Value:        BLOOD CULTURE RECEIVED NO GROWTH TO DATE CULTURE WILL BE HELD FOR 5 DAYS BEFORE ISSUING A FINAL NEGATIVE REPORT   Report Status PENDING   Incomplete   CULTURE, BLOOD (ROUTINE X 2)     Status: Normal (Preliminary result)   Collection Time   09/21/12  3:06 PM      Component Value Range Status Comment   Specimen Description BLOOD RIGHT HAND   Final    Special Requests BOTTLES DRAWN AEROBIC ONLY 10CC   Final    Culture  Setup Time 09/21/2012 23:40   Final    Culture     Final    Value:        BLOOD CULTURE RECEIVED NO GROWTH TO DATE CULTURE WILL BE HELD FOR 5 DAYS BEFORE  ISSUING A FINAL NEGATIVE REPORT   Report Status PENDING   Incomplete   CULTURE, EXPECTORATED SPUTUM-ASSESSMENT     Status: Normal   Collection Time   09/21/12  4:38 PM      Component Value Range Status Comment   Specimen Description SPUTUM   Final    Special Requests NONE   Final    Sputum evaluation     Final    Value: THIS SPECIMEN IS ACCEPTABLE. RESPIRATORY CULTURE REPORT TO FOLLOW.   Report Status 09/21/2012 FINAL   Final   CULTURE, RESPIRATORY     Status: Normal (Preliminary result)   Collection Time   09/21/12  4:38 PM      Component Value Range Status Comment   Specimen Description SPUTUM   Final    Special Requests NONE  Final    Gram Stain PENDING   Incomplete    Culture Culture reincubated for better growth   Final    Report Status PENDING   Incomplete      Studies:  Recent x-ray studies have been reviewed in detail by the Attending Physician  Scheduled Meds:  Reviewed in detail by the Attending Physician   Junious Silk, ANP [facility] Office  [phone] Pager [phone]  On-Call/Text Page:      [email or web address]      password [email or web address]  If 7PM-7AM, please contact night-coverage [email or web address] Password [email or web address] 09/22/2012, 2:13 PM   LOS: 1 day   I have personally examined this patient and reviewed the entire database. I have reviewed the above note, made any necessary editorial changes, and agree with its content.  Lonia Blood, MD [facility]

## 2012-09-22 NOTE — Progress Notes (Signed)
Patient transferred from 3300 by wheelchair.  Oriented to room.  Cardiac monitoring as ordered.  Vital signs stable, no complaints of pain.  O2 2L on.

## 2012-09-22 NOTE — Progress Notes (Signed)
24 hour urine collection started at 1700

## 2012-09-22 NOTE — Evaluation (Signed)
Physical Therapy Evaluation Patient Details Name: [name] MRN: [id number] DOB: Dec 30, 1950 Today's Date: 09/22/2012 Time: 1203-1229 PT Time Calculation (min): 26 min  PT Assessment / Plan / Recommendation Clinical Impression  Patient is a 61 yo male admitted with SOB, URI.  Patient's mobility limited by cardiopulmonary issues.  Patient will benefit from acute PT to maximize independence prior to discharge.  Do not anticipate any f/u PT needs.    PT Assessment  Patient needs continued PT services    Follow Up Recommendations  No PT follow up;Supervision - Intermittent    Does the patient have the potential to tolerate intense rehabilitation      Barriers to Discharge Decreased caregiver support      Equipment Recommendations   (TBD)    Recommendations for Other Services     Frequency Min 3X/week    Precautions / Restrictions Precautions Precautions: Fall (02) Precaution Comments: On O2 at home Restrictions Weight Bearing Restrictions: No   Pertinent Vitals/Pain HR increased to 124 with gait.      Mobility  Bed Mobility Bed Mobility: Not assessed Transfers Transfers: Sit to Stand;Stand to Sit Sit to Stand: 4: Min guard;With upper extremity assist;From bed Stand to Sit: 4: Min guard;With upper extremity assist;With armrests;To chair/3-in-1 Details for Transfer Assistance: Verbal cues for hand placement.  Steady with transfers Ambulation/Gait Ambulation/Gait Assistance: 4: Min guard Ambulation Distance (Feet): 180 Feet Assistive device: Other (Comment) (Pushing wheelchair) Ambulation/Gait Assistance Details: Cues to move at slower pace to minimize dyspnea and increase in HR.  Gait pattern good. Gait Pattern: Step-through pattern;Trunk flexed Gait velocity: slow gait speed           PT Diagnosis: Difficulty walking;Abnormality of gait  PT Problem List: Decreased strength;Decreased activity tolerance;Decreased balance;Decreased mobility;Cardiopulmonary  status limiting activity;Obesity PT Treatment Interventions: DME instruction;Gait training;Stair training;Functional mobility training;Patient/family education   PT Goals Acute Rehab PT Goals PT Goal Formulation: With patient Time For Goal Achievement: 09/29/12 Potential to Achieve Goals: Good Pt will go Supine/Side to Sit: Independently;with HOB 0 degrees PT Goal: Supine/Side to Sit - Progress: Goal set today Pt will go Sit to Supine/Side: Independently;with HOB 0 degrees PT Goal: Sit to Supine/Side - Progress: Goal set today Pt will go Sit to Stand: with modified independence;with upper extremity assist PT Goal: Sit to Stand - Progress: Goal set today Pt will Ambulate: >150 feet;with modified independence;with least restrictive assistive device PT Goal: Ambulate - Progress: Goal set today Pt will Go Up / Down Stairs: 1-2 stairs;with supervision;with rail(s) PT Goal: Up/Down Stairs - Progress: Goal set today  Visit Information  Last PT Received On: 09/22/12 Assistance Needed: +1    Subjective Data  Subjective: "I just go short of breath and had to come to the hospital." Patient Stated Goal: To return home   Prior Functioning  Home Living Lives With: Spouse Available Help at Discharge: Available PRN/intermittently;Family Type of Home: House Home Access: Stairs to enter [employer] of Steps: 2 Entrance Stairs-Rails: Right Home Layout: One level Bathroom Shower/Tub: Walk-in shower;Tub/shower unit Charity fundraiser with back Prior Function Level of Independence: Independent Able to Take Stairs?: Yes Driving: Yes Vocation: Retired Musician: No difficulties Dominant Hand: Right    Cognition  Overall Cognitive Status: Appears within functional limits for tasks assessed/performed Arousal/Alertness: Awake/alert Orientation Level: Appears intact for tasks assessed Behavior During Session: [facility] for  tasks performed    Extremity/Trunk Assessment Right Upper Extremity Assessment RUE ROM/Strength/Tone: [facility] for tasks assessed RUE  Sensation: WFL - Light Touch RUE Coordination: WFL - gross/fine motor Left Upper Extremity Assessment LUE ROM/Strength/Tone: WFL for tasks assessed LUE Sensation: WFL - Light Touch LUE Coordination: WFL - gross/fine motor Right Lower Extremity Assessment RLE ROM/Strength/Tone: WFL for tasks assessed RLE Sensation: History of peripheral neuropathy Left Lower Extremity Assessment LLE ROM/Strength/Tone: WFL for tasks assessed LLE Sensation: History of peripheral neuropathy   Balance Balance Balance Assessed: Yes Static Sitting Balance Static Sitting - Balance Support: No upper extremity supported;Feet supported Static Sitting - Level of Assistance: 7: Independent Static Sitting - Comment/# of Minutes: Patient with good balance and upright posture in sitting on EOB.  End of Session PT - End of Session Equipment Utilized During Treatment: Gait belt;Oxygen Activity Tolerance: Patient limited by fatigue;Treatment limited secondary to medical complications (Comment) (Limited by increase in HR) Patient left: in chair;with call bell/phone within reach Nurse Communication: Mobility status  GP     Vena Austria 09/22/2012, 4:19 PM Durenda Hurt. Renaldo Fiddler, [facility] Pager [phone]

## 2012-09-22 NOTE — Progress Notes (Signed)
Inpatient Diabetes Program Recommendations  AACE/ADA: New Consensus Statement on Inpatient Glycemic Control (2013)  Target Ranges:  Prepandial:   less than 140 mg/dL      Peak postprandial:   less than 180 mg/dL (1-2 hours)      Critically ill patients:  140 - 180 mg/dL   Results for VICENTE, WEIDLER (MRN [id number]) as of 09/22/2012 09:41  Ref. Range 09/21/2012 14:14 09/21/2012 17:06 09/21/2012 21:45 09/22/2012 07:59  Glucose-Capillary Latest Range: 70-99 mg/dL 409 (H) 811 (H) 914 (H) 143 (H)    Inpatient Diabetes Program Recommendations Correction (SSI): Please consider adding HS coverage.  CBG at bedtime on 11/17 was 334 mg/dl.   Note: Patient takes Lantus 80 units QAM at home.  According to med rec the last dose of Lantus was given at home on 11/16.  Patient received Lantus 80 units on 11/17 around 17:00.  Please consider adding HS coverage since CBG at bedtime was 334 mg/dl.  Will continue to monitor.  Thanks, Orlando Penner, RN, BSN, CCRN Diabetes Coordinator Inpatient Diabetes Program [phone]

## 2012-09-22 NOTE — Progress Notes (Signed)
Utilization review completed.  

## 2012-09-23 ENCOUNTER — Inpatient Hospital Stay (HOSPITAL_COMMUNITY): Payer: Managed Care, Other (non HMO)

## 2012-09-23 ENCOUNTER — Other Ambulatory Visit: Payer: Self-pay | Admitting: Surgery

## 2012-09-23 DIAGNOSIS — D381 Neoplasm of uncertain behavior of trachea, bronchus and lung: Secondary | ICD-10-CM

## 2012-09-23 LAB — BASIC METABOLIC PANEL
BUN: 12 mg/dL (ref 6–23)
CO2: 24 mEq/L (ref 19–32)
Calcium: 8.8 mg/dL (ref 8.4–10.5)
Creatinine, Ser: 0.8 mg/dL (ref 0.50–1.35)
GFR calc non Af Amer: >90 mL/min (ref >90)
Glucose, Bld: 156 mg/dL — ABNORMAL HIGH (ref 70–99)
Sodium: 140 mEq/L (ref 135–145)

## 2012-09-23 LAB — CBC
MCH: 28.4 pg (ref 26.0–34.0)
MCHC: 33 g/dL (ref 30.0–36.0)
MCV: 86.1 fL (ref 78.0–100.0)
Platelets: 263 10*3/uL (ref 150–400)
RDW: 14.4 % (ref 11.5–15.5)

## 2012-09-23 LAB — CALCIUM, URINE, RANDOM: Calcium, Ur: <1 mg/dL

## 2012-09-23 MED ORDER — DILTIAZEM HCL ER COATED BEADS 180 MG PO CP24
180.0000 mg | ORAL_CAPSULE | Freq: Every day | ORAL | Status: DC
  Administered 2012-09-24: 180 mg via ORAL
  Filled 2012-09-23: qty 1

## 2012-09-23 MED ORDER — INSULIN ASPART 100 UNIT/ML ~~LOC~~ SOLN
0.0000 [IU] | Freq: Three times a day (TID) | SUBCUTANEOUS | Status: DC
  Administered 2012-09-23: 3 [IU] via SUBCUTANEOUS
  Administered 2012-09-23 – 2012-09-24 (×3): 8 [IU] via SUBCUTANEOUS

## 2012-09-23 MED ORDER — MAGNESIUM CHLORIDE 64 MG PO TBEC: 1.0000 | DELAYED_RELEASE_TABLET | Freq: Two times a day (BID) | ORAL | Status: DC

## 2012-09-23 MED ORDER — INSULIN GLARGINE 100 UNIT/ML ~~LOC~~ SOLN
95.0000 [IU] | Freq: Every day | SUBCUTANEOUS | Status: DC
  Administered 2012-09-23: 95 [IU] via SUBCUTANEOUS

## 2012-09-23 MED ORDER — METHYLPREDNISOLONE SODIUM SUCC 40 MG IJ SOLR
40.0000 mg | Freq: Two times a day (BID) | INTRAMUSCULAR | Status: DC
  Administered 2012-09-23 – 2012-09-24 (×3): 40 mg via INTRAVENOUS
  Filled 2012-09-23 (×4): qty 1

## 2012-09-23 NOTE — Progress Notes (Signed)
ANTIBIOTIC CONSULT NOTE - Pharmacy Consult  antibiotic adjustment based on renal function Indication: rule out pneumonia  Allergies  Allergen Reactions  . Demerol (Meperidine) Other (See Comments)    Reaction unknown    Vital Signs: Temp: 98.2 F (36.8 C) (11/19 0524) Temp src: Oral (11/19 0524) BP: 129/63 mmHg (11/19 0524) Pulse Rate: 72  (11/19 0524) Intake/Output from previous day: 11/18 0701 - 11/19 0700 In: 1938.8 [P.O.:720; I.V.:1218.8] Out: 4300 [Urine:4300] Intake/Output from this shift: Total I/O In: -  Out: 300 [Urine:300]  Labs:  [facility] 09/23/12 0540 09/22/12 0410 09/21/12 1453  WBC 11.8* 14.0* 14.4*  HGB 10.2* 10.4* 11.1*  PLT 263 231 241  LABCREA -- -- --  CREATININE 0.80 0.88 0.88   Estimated Creatinine Clearance: 131.6 ml/min (by C-G formula based on Cr of 0.8). No results found for this basename: VANCOTROUGH:2,VANCOPEAK:2,VANCORANDOM:2,GENTTROUGH:2,GENTPEAK:2,GENTRANDOM:2,TOBRATROUGH:2,TOBRAPEAK:2,TOBRARND:2,AMIKACINPEAK:2,AMIKACINTROU:2,AMIKACIN:2, in the last 72 hours   Microbiology: Recent Results (from the past 720 hour(s))  MRSA PCR SCREENING     Status: Normal   Collection Time   09/21/12  2:34 PM      Component Value Range Status Comment   MRSA by PCR NEGATIVE  NEGATIVE Final   CULTURE, BLOOD (ROUTINE X 2)     Status: Normal (Preliminary result)   Collection Time   09/21/12  3:01 PM      Component Value Range Status Comment   Specimen Description BLOOD RIGHT ARM   Final    Special Requests BOTTLES DRAWN AEROBIC AND ANAEROBIC 10CC   Final    Culture  Setup Time 09/21/2012 23:40   Final    Culture     Final    Value:        BLOOD CULTURE RECEIVED NO GROWTH TO DATE CULTURE WILL BE HELD FOR 5 DAYS BEFORE ISSUING A FINAL NEGATIVE REPORT   Report Status PENDING   Incomplete   CULTURE, BLOOD (ROUTINE X 2)     Status: Normal (Preliminary result)   Collection Time   09/21/12  3:06 PM      Component Value Range Status Comment   Specimen  Description BLOOD RIGHT HAND   Final    Special Requests BOTTLES DRAWN AEROBIC ONLY 10CC   Final    Culture  Setup Time 09/21/2012 23:40   Final    Culture     Final    Value:        BLOOD CULTURE RECEIVED NO GROWTH TO DATE CULTURE WILL BE HELD FOR 5 DAYS BEFORE ISSUING A FINAL NEGATIVE REPORT   Report Status PENDING   Incomplete   CULTURE, EXPECTORATED SPUTUM-ASSESSMENT     Status: Normal   Collection Time   09/21/12  4:38 PM      Component Value Range Status Comment   Specimen Description SPUTUM   Final    Special Requests NONE   Final    Sputum evaluation     Final    Value: THIS SPECIMEN IS ACCEPTABLE. RESPIRATORY CULTURE REPORT TO FOLLOW.   Report Status 09/21/2012 FINAL   Final   CULTURE, RESPIRATORY     Status: Normal (Preliminary result)   Collection Time   09/21/12  4:38 PM      Component Value Range Status Comment   Specimen Description SPUTUM   Final    Special Requests NONE   Final    Gram Stain     Final    Value: ABUNDANT WBC PRESENT, PREDOMINANTLY PMN     RARE SQUAMOUS EPITHELIAL CELLS PRESENT  MODERATE GRAM NEGATIVE RODS     MODERATE GRAM POSITIVE COCCI IN PAIRS AND CHAINS     RARE GRAM POSITIVE RODS   Culture Culture reincubated for better growth   Final    Report Status PENDING   Incomplete   RESPIRATORY VIRUS PANEL     Status: Abnormal   Collection Time   09/22/12  5:22 AM      Component Value Range Status Comment   Source - [facility] NASAL SWAB   Corrected CORRECTED ON 11/18 AT 1951: PREVIOUSLY REPORTED AS NASAL SWAB   Respiratory Syncytial Virus A NOT DETECTED   Final    Respiratory Syncytial Virus B NOT DETECTED   Final    Influenza A NOT DETECTED   Final    Influenza B NOT DETECTED   Final    Parainfluenza 1 NOT DETECTED   Final    Parainfluenza 2 NOT DETECTED   Final    Parainfluenza 3 NOT DETECTED   Final    Metapneumovirus NOT DETECTED   Final    Rhinovirus DETECTED (*)  Final    Adenovirus NOT DETECTED   Final    Influenza A H1 NOT DETECTED    Final    Influenza A H3 NOT DETECTED   Final     Medical History: Past Medical History  Diagnosis Date  . Anemia   . Allergy   . Thyroid disease   . Parathyroid disease   . Headache   . Hypertension     takes  HYzaar daily  . Coronary artery disease     has 1 stent  . Myocardial infarct   . Asthma   . Emphysema     sees Dr.Ramaswami for this  . COPD (chronic obstructive pulmonary disease)     uses Albuterol and Spiriva daily  . Shortness of breath     with exertion   . Bronchitis   . Productive cough     white in color but no odor  . Sleep apnea     uses BiPaP  . Peripheral neuropathy   . Lung mass     right upper lobe  . Back pain     4 deteriorating disc and receives an injection q3-87mon;has been doing this for about 20yrs  . H/O hiatal hernia   . GERD (gastroesophageal reflux disease)     takes Prilosec daily  . Blood transfusion     as a child  . Glaucoma(365)     hx of  . Depression     takes Prozac daily  . Insomnia     takes Trazodone nightly  . Anginal pain   . Chronic kidney disease     acute kidney failure post surgery  . Pneumonia     hx of' 61 yo,rd' last time in 2012  . Type II diabetes mellitus     takes Metformin bid and Novolog and Lantus daily  . Arthritis     "hands"    Assessment: 61 yoM admitted 11/17 with SOB, CP, cough with green sputum, and flu-like symptoms x 3 days  Now only on levaquin.  Renal function stable.   Goal of Therapy:  Vancomycin trough level 15-20 mcg/ml  Plan:  - Pharmacy will sign off.  Please advise if we can be of further assiatance Talbert Cage, PharmD Pharmacy: [phone] 09/23/2012 1:59 PM

## 2012-09-23 NOTE — Progress Notes (Signed)
[facility] PROGRESS NOTE  CHRIST KIRN ONG:[id number] DOB: [birth year] DOA: 09/21/2012 PCP: Leo Grosser, MD  Assessment/Plan: Principal Problem:  *Acute respiratory failure with hypoxia Active Problems:  C O P D  Non-small cell carcinoma of lung, stage 1  Hypertension  DM type 2 (diabetes mellitus, type 2)  Sleep apnea  GERD (gastroesophageal reflux disease)  Hypokalemia  S/P lobectomy of lung  Hypomagnesemia-chronic  Viral upper respiratory illness  Leukocytosis  Fluid volume depletion     Assessment/Plan:  *Acute respiratory failure with hypoxia due to suspected Viral upper respiratory illness  *Remain stable on nasal cannula oxygen  *Viral panels including influenza pending  *Continue droplet isolation  *Continue antibiotics to treat for possible tracheobronchitis  Add iv steroids as still wheezing  Leukocytosis  *Does not appear to have acute bacterial infection based on chest x-ray findings  *Leukocytosis primarily appears to be related to volume depletion i.e. hemoconcentration   Non-small cell carcinoma of lung, stage 1 S/P lobectomy of lung  *Review of plain x-rays show no evidence of recurrence of lung mass  *Patient has followup CT scan pending in December   Hypertension  *Blood pressure currently well-controlled  *Continue diltiazem  2 d echo given PVC's on tele, monitor QTC   DM type 2 (diabetes mellitus, type 2)  *CBGs initially were poorly controlled and greater than 300 but are improving now  *Continue sliding scale insulin  *Persistent elevations in glucose can contribute to low magnesium level   Hypokalemia  *Oral replete and follow electrolyte panel   C O P D/Sleep apnea  *Not clinically exacerbated on exam  *Continue nebulizer treatments and usual home and inhalers   GERD (gastroesophageal reflux disease)  *Continue home Protonix  Hypomagnesemia-chronic  *Magnesium level very low at 1.1 which is chronic  *Continue oral  repletion  *d/c PPI (a common cause of hypo-Mg) and follow    DVT prophylaxis: Lovenox  Code Status: Full  Family Communication: Spoke with the patient  Disposition Plan: likely home in am    Consultants:  None  Procedures:  None  Antibiotics:  Rocephin 11/18 >>>  Levaquin 11/18 >>>  Vancomycin 11/18 >>>   HPI/Subjective:  Patient alert and sitting up in chair without any specific complaints.       Brief narrative: Brief narrative:  61 year old male patient with known history of COPD as well as prior lobectomy for non-small cell carcinoma in May 2013. He presented to the emergency part with complaints of coughing x3 days with productive green sputum. Symptoms initially began after he had been working outside and was exposed to freshly mowed grass and old leaves. Later he developed diarrhea. Eventually he progressed to experiencing shortness of breath that increased to the point he felt he needed to seek medical attention. He normally has nocturnal oxygen to use with his CPAP for sleep apnea. In addition he was having anorexia. Most limiting symptom was new dyspnea on exertion. He had been exposed to his grandson who had what sounds like a viral illness. Upon evaluation in the emergency department he was tachypneic and had relative hypotension that responded to IV fluid administration. An ABG had been performed it demonstrated possible hypoxemia with a PO2 of 57 but review of room air ABGs from 2013 revealed a baseline PO2 between 50 and 60. He did have mild leukocytosis. Lactic acid was normal at 1.5. It was felt the patient had a viral respiratory illness and was subsequently admitted to the step down unit for further monitoring  and treatment.      Consultants:    Objective: Filed Vitals:   09/22/12 2121 09/22/12 2200 09/23/12 0524 09/23/12 0846  BP:  118/66 129/63   Pulse: 78 98 72   Temp:  97.7 F (36.5 C) 98.2 F (36.8 C)   TempSrc:  Oral Oral   Resp: 20 20 24      Height:      Weight:      SpO2:  98% 95% 94%    Intake/Output Summary (Last 24 hours) at 09/23/12 1203 Last data filed at 09/23/12 1100  Gross per 24 hour  Intake 1698.75 ml  Output   4100 ml  Net -2401.25 ml    Exam:  General: No acute respiratory distress  Lungs: Clear to auscultation bilaterally without wheezes or crackles, nasal cannula oxygen  Cardiovascular: Regular rate and rhythm without murmur gallop or rub normal S1 and S2  Abdomen: Nontender, nondistended, soft, bowel sounds positive, no rebound, no ascites, no appreciable mass  Extremities: No significant cyanosis, clubbing, or edema bilateral lower extremities    Data Reviewed: Basic Metabolic Panel:  Lab 09/23/12 1610 09/22/12 0410 09/21/12 1453 09/21/12 1030 09/21/12 0915  NA 140 138 -- -- 138  K 3.6 3.2* -- -- 3.6  CL 103 97 -- -- 95*  CO2 24 26 -- -- 31  GLUCOSE 156* 190* -- -- 192*  BUN 12 15 -- -- 15  CREATININE 0.80 0.88 0.88 -- 0.95  CALCIUM 8.8 8.2* -- -- 8.6  MG -- -- -- 1.1* --  PHOS -- -- 3.3 -- --    Liver Function Tests:  Lab 09/22/12 0410  AST 24  ALT 21  ALKPHOS 107  BILITOT 0.2*  PROT 6.8  ALBUMIN 2.8*   No results found for this basename: LIPASE:5,AMYLASE:5 in the last 168 hours No results found for this basename: AMMONIA:5 in the last 168 hours  CBC:  Lab 09/23/12 0540 09/22/12 0410 09/21/12 1453 09/21/12 0915  WBC 11.8* 14.0* 14.4* 14.1*  NEUTROABS -- 11.2* -- --  HGB 10.2* 10.4* 11.1* 11.0*  HCT 30.9* 30.5* 33.4* 32.3*  MCV 86.1 85.9 87.2 86.8  PLT 263 231 241 203    Cardiac Enzymes: No results found for this basename: CKTOTAL:5,CKMB:5,CKMBINDEX:5,TROPONINI:5 in the last 168 hours BNP (last 3 results)  Basename 04/23/12 1315 04/03/12 0550  PROBNP 501.5* 368.7*     CBG:  Lab 09/23/12 1149 09/22/12 2206 09/22/12 1712 09/22/12 1209 09/22/12 0759  GLUCAP 169* 255* 298* 184* 143*    Recent Results (from the past 240 hour(s))  MRSA PCR SCREENING     Status:  Normal   Collection Time   09/21/12  2:34 PM      Component Value Range Status Comment   MRSA by PCR NEGATIVE  NEGATIVE Final   CULTURE, BLOOD (ROUTINE X 2)     Status: Normal (Preliminary result)   Collection Time   09/21/12  3:01 PM      Component Value Range Status Comment   Specimen Description BLOOD RIGHT ARM   Final    Special Requests BOTTLES DRAWN AEROBIC AND ANAEROBIC 10CC   Final    Culture  Setup Time 09/21/2012 23:40   Final    Culture     Final    Value:        BLOOD CULTURE RECEIVED NO GROWTH TO DATE CULTURE WILL BE HELD FOR 5 DAYS BEFORE ISSUING A FINAL NEGATIVE REPORT   Report Status PENDING   Incomplete   CULTURE, BLOOD (  ROUTINE X 2)     Status: Normal (Preliminary result)   Collection Time   09/21/12  3:06 PM      Component Value Range Status Comment   Specimen Description BLOOD RIGHT HAND   Final    Special Requests BOTTLES DRAWN AEROBIC ONLY 10CC   Final    Culture  Setup Time 09/21/2012 23:40   Final    Culture     Final    Value:        BLOOD CULTURE RECEIVED NO GROWTH TO DATE CULTURE WILL BE HELD FOR 5 DAYS BEFORE ISSUING A FINAL NEGATIVE REPORT   Report Status PENDING   Incomplete   CULTURE, EXPECTORATED SPUTUM-ASSESSMENT     Status: Normal   Collection Time   09/21/12  4:38 PM      Component Value Range Status Comment   Specimen Description SPUTUM   Final    Special Requests NONE   Final    Sputum evaluation     Final    Value: THIS SPECIMEN IS ACCEPTABLE. RESPIRATORY CULTURE REPORT TO FOLLOW.   Report Status 09/21/2012 FINAL   Final   CULTURE, RESPIRATORY     Status: Normal (Preliminary result)   Collection Time   09/21/12  4:38 PM      Component Value Range Status Comment   Specimen Description SPUTUM   Final    Special Requests NONE   Final    Gram Stain     Final    Value: ABUNDANT WBC PRESENT, PREDOMINANTLY PMN     RARE SQUAMOUS EPITHELIAL CELLS PRESENT     MODERATE GRAM NEGATIVE RODS     MODERATE GRAM POSITIVE COCCI IN PAIRS AND CHAINS      RARE GRAM POSITIVE RODS   Culture Culture reincubated for better growth   Final    Report Status PENDING   Incomplete   RESPIRATORY VIRUS PANEL     Status: Abnormal   Collection Time   09/22/12  5:22 AM      Component Value Range Status Comment   Source - [facility] NASAL SWAB   Corrected CORRECTED ON 11/18 AT 1951: PREVIOUSLY REPORTED AS NASAL SWAB   Respiratory Syncytial Virus A NOT DETECTED   Final    Respiratory Syncytial Virus B NOT DETECTED   Final    Influenza A NOT DETECTED   Final    Influenza B NOT DETECTED   Final    Parainfluenza 1 NOT DETECTED   Final    Parainfluenza 2 NOT DETECTED   Final    Parainfluenza 3 NOT DETECTED   Final    Metapneumovirus NOT DETECTED   Final    Rhinovirus DETECTED (*)  Final    Adenovirus NOT DETECTED   Final    Influenza A H1 NOT DETECTED   Final    Influenza A H3 NOT DETECTED   Final      Studies: Dg Chest 2 View  09/22/2012  *RADIOLOGY REPORT*  Clinical Data: Shortness of breath, possible pneumonia, diabetes  CHEST - 2 VIEW  Comparison: Chest x-ray of 09/21/2012  Findings: Postsurgical changes in the right hemithorax are stable with volume loss and scarring.  There is cardiomegaly present and there may be mild pulmonary vascular congestion present.  No effusion is seen.  There are degenerative changes noted in the lower thoracic spine.  IMPRESSION:  1.  Cardiomegaly.  Question mild pulmonary vascular congestion. 2.  Stable postoperative changes in the right hemithorax.   Original Report Authenticated By: Renae Fickle  Gery Pray, M.D.    Dg Chest 2 View  09/21/2012  *RADIOLOGY REPORT*  Clinical Data: Shortness of breath, cough  CHEST - 2 VIEW  Comparison: 07/22/2012  Findings: Postsurgical changes in the right hemithorax with volume loss and tenting of the right hemidiaphragm.  Chronic interstitial markings/emphysematous changes.  No focal consolidation.  Stable borderline cardiomegaly.  Degenerative changes of the visualized thoracolumbar spine.  IMPRESSION:  Postsurgical changes in the right hemithorax.  Chronic interstitial markings/emphysematous changes.   Original Report Authenticated By: Charline Bills, M.D.    Dg Chest Port 1 View  09/23/2012  *RADIOLOGY REPORT*  Clinical Data: Cough, shortness of breath, follow up of infiltrate  PORTABLE CHEST - 1 VIEW  Comparison: Chest x-ray of 09/22/2012  Findings: The lungs are not quite as well aerated.  There is slight opacity at the right lung base most consistent with atelectasis. Heart size is stable.  IMPRESSION: Diminished aeration.  Slight increase in opacity at the right lung base.   Original Report Authenticated By: Dwyane Dee, M.D.     Scheduled Meds:   . albuterol  2.5 mg Nebulization QID  . aspirin EC  81 mg Oral Daily  . diltiazem  120 mg Oral Daily  . enoxaparin  60 mg Subcutaneous Q24H  . famotidine  40 mg Oral Daily  . FLUoxetine  40 mg Oral Daily  . gabapentin  300 mg Oral TID  . insulin aspart  0-15 Units Subcutaneous TID WC  . insulin aspart  3 Units Subcutaneous TID WC  . insulin glargine  80 Units Subcutaneous Daily  . levofloxacin (LEVAQUIN) IV  750 mg Intravenous Q24H  . magnesium oxide  400 mg Oral TID  . potassium chloride  40 mEq Oral BID  . thiamine  100 mg Oral Daily  . tiotropium  18 mcg Inhalation Daily  . traZODone  100 mg Oral QHS  . [DISCONTINUED] albuterol  2.5 mg Nebulization Q4H  . [DISCONTINUED] cefTRIAXone (ROCEPHIN)  IV  1 g Intravenous Q24H  . [DISCONTINUED] insulin aspart  0-15 Units Subcutaneous TID WC  . [DISCONTINUED] ipratropium  0.5 mg Nebulization Once  . [DISCONTINUED] pantoprazole  40 mg Oral Daily  . [DISCONTINUED] vancomycin  1,250 mg Intravenous Q12H   Continuous Infusions:   . sodium chloride 75 mL/hr at 09/23/12 1610    Principal Problem:  *Acute respiratory failure with hypoxia Active Problems:  C O P D  Non-small cell carcinoma of lung, stage 1  Hypertension  DM type 2 (diabetes mellitus, type 2)  Sleep apnea  GERD  (gastroesophageal reflux disease)  Hypokalemia  S/P lobectomy of lung  Hypomagnesemia-chronic  Viral upper respiratory illness  Leukocytosis  Fluid volume depletion    Time spent: 40 minutes   [facility]  [facility] Pager [phone]. If 8PM-8AM, please contact night-coverage at [email or web address], password [facility] 09/23/2012, 12:03 PM  LOS: 2 days

## 2012-09-24 LAB — BASIC METABOLIC PANEL
BUN: 14 mg/dL (ref 6–23)
CO2: 26 mEq/L (ref 19–32)
Calcium: 9.7 mg/dL (ref 8.4–10.5)
GFR calc non Af Amer: >90 mL/min (ref >90)
Glucose, Bld: 292 mg/dL — ABNORMAL HIGH (ref 70–99)
Potassium: 4.4 mEq/L (ref 3.5–5.1)
Sodium: 136 mEq/L (ref 135–145)

## 2012-09-24 LAB — CULTURE, RESPIRATORY W GRAM STAIN

## 2012-09-24 LAB — GLUCOSE, CAPILLARY: Glucose-Capillary: 335 mg/dL — ABNORMAL HIGH (ref 70–99)

## 2012-09-24 LAB — CBC
HCT: 32.5 % — ABNORMAL LOW (ref 39.0–52.0)
Hemoglobin: 11.1 g/dL — ABNORMAL LOW (ref 13.0–17.0)
MCH: 28.9 pg (ref 26.0–34.0)
MCHC: 34.2 g/dL (ref 30.0–36.0)
RBC: 3.84 MIL/uL — ABNORMAL LOW (ref 4.22–5.81)

## 2012-09-24 MED ORDER — MAGNESIUM 250 MG PO TABS: 500.0000 mg | ORAL_TABLET | Freq: Four times a day (QID) | ORAL | Status: DC

## 2012-09-24 MED ORDER — ALBUTEROL SULFATE (2.5 MG/3ML) 0.083% IN NEBU: 2.5000 mg | INHALATION_SOLUTION | RESPIRATORY_TRACT | Status: DC | PRN

## 2012-09-24 MED ORDER — PREDNISONE 5 MG PO TABS: 5.0000 mg | ORAL_TABLET | Freq: Every day | ORAL | Status: DC

## 2012-09-24 MED ORDER — OXYCODONE HCL 5 MG PO TABS: 5.0000 mg | ORAL_TABLET | Freq: Three times a day (TID) | ORAL | Status: DC | PRN

## 2012-09-24 MED ORDER — LEVOFLOXACIN 500 MG PO TABS: 500.0000 mg | ORAL_TABLET | Freq: Every day | ORAL | Status: DC

## 2012-09-24 NOTE — Progress Notes (Signed)
Patient discharged to home in care of spouse and children. Medications and instructions reviewed with patient and family with no questions. IV d/c'd with cath intact. Assessment unchanged from this am.

## 2012-09-24 NOTE — Discharge Summary (Signed)
Physician Discharge Summary  [name] MRN: [id number] DOB/AGE: 1951/04/29 61 y.o.  PCP: Leo Grosser, MD   Admit date: 09/21/2012 Discharge date: 09/24/2012  Discharge Diagnoses:     *Acute respiratory failure with hypoxia Active Problems:  C O P D  Non-small cell carcinoma of lung, stage 1  Hypertension  DM type 2 (diabetes mellitus, type 2)  Sleep apnea  GERD (gastroesophageal reflux disease)  Hypokalemia  S/P lobectomy of lung  Hypomagnesemia-chronic  Viral upper respiratory illness  Leukocytosis  Fluid volume depletion     Medication List     As of 09/24/2012 10:04 AM    TAKE these medications         acetaminophen 500 MG tablet   Commonly known as: TYLENOL   Take 1,000 mg by mouth every 6 (six) hours as needed. For pain      albuterol (2.5 MG/3ML) 0.083% nebulizer solution   Commonly known as: PROVENTIL   Take 3 mLs (2.5 mg total) by nebulization every 4 (four) hours as needed. For shortness of breath      ALKA-SELTZER PLUS COLD & FLU PO   Take 1 packet by mouth daily as needed. For cold/flu symptoms (cough, congestion, etc.)      aspirin EC 81 MG tablet   Take 81 mg by mouth daily.      atorvastatin 20 MG tablet   Commonly known as: LIPITOR   Take 20 mg by mouth daily.      beclomethasone 80 MCG/ACT inhaler   Commonly known as: QVAR   Inhale 2 puffs into the lungs 2 (two) times daily as needed. For shortness of breath      calcium carbonate 1250 MG tablet   Commonly known as: OS-CAL - dosed in mg of elemental calcium   Take 1 tablet by mouth daily.      diltiazem 120 MG 24 hr capsule   Commonly known as: CARDIZEM CD   Take 120 mg by mouth daily.      FLUoxetine 40 MG capsule   Commonly known as: PROZAC   Take 40 mg by mouth daily.      gabapentin 300 MG capsule   Commonly known as: NEURONTIN   Take 300 mg by mouth 3 (three) times daily.      insulin aspart 100 UNIT/ML injection   Commonly known as: novoLOG   Inject  10-25 Units into the skin 3 (three) times daily before meals. Home sliding scale; >140 = 10 units up to 25 units      insulin glargine 100 UNIT/ML injection   Commonly known as: LANTUS   Inject 80 Units into the skin every morning.      levalbuterol 0.63 MG/3ML nebulizer solution   Commonly known as: XOPENEX   Take 1 ampule by nebulization every 4 (four) hours as needed. For shortness of breath      levofloxacin 500 MG tablet   Commonly known as: LEVAQUIN   Take 1 tablet (500 mg total) by mouth daily.      Magnesium 250 MG Tabs   Take 2 tablets (500 mg total) by mouth 4 (four) times daily.      multivitamin with minerals Tabs   Take 1 tablet by mouth daily.      oxyCODONE 5 MG immediate release tablet   Commonly known as: Oxy IR/ROXICODONE   Take 1 tablet (5 mg total) by mouth every 8 (eight) hours as needed.      pantoprazole 40 MG  tablet   Commonly known as: PROTONIX   Take 40 mg by mouth daily.      predniSONE 5 MG tablet   Commonly known as: DELTASONE   Take 1 tablet (5 mg total) by mouth daily.      tiotropium 18 MCG inhalation capsule   Commonly known as: SPIRIVA   Place 18 mcg into inhaler and inhale daily.      traZODone 100 MG tablet   Commonly known as: DESYREL   Take 100 mg by mouth at bedtime.      ASK your doctor about these medications         thiamine 100 MG tablet   Commonly known as: VITAMIN B-1   Take 100 mg by mouth daily.        Discharge Condition: Stable  Disposition: 01-Home or Self Care   Consults:    Significant Diagnostic Studies: Dg Chest 2 View  09/22/2012  *RADIOLOGY REPORT*  Clinical Data: Shortness of breath, possible pneumonia, diabetes  CHEST - 2 VIEW  Comparison: Chest x-ray of 09/21/2012  Findings: Postsurgical changes in the right hemithorax are stable with volume loss and scarring.  There is cardiomegaly present and there may be mild pulmonary vascular congestion present.  No effusion is seen.  There are degenerative  changes noted in the lower thoracic spine.  IMPRESSION:  1.  Cardiomegaly.  Question mild pulmonary vascular congestion. 2.  Stable postoperative changes in the right hemithorax.   Original Report Authenticated By: Dwyane Dee, M.D.    Dg Chest 2 View  09/21/2012  *RADIOLOGY REPORT*  Clinical Data: Shortness of breath, cough  CHEST - 2 VIEW  Comparison: 07/22/2012  Findings: Postsurgical changes in the right hemithorax with volume loss and tenting of the right hemidiaphragm.  Chronic interstitial markings/emphysematous changes.  No focal consolidation.  Stable borderline cardiomegaly.  Degenerative changes of the visualized thoracolumbar spine.  IMPRESSION: Postsurgical changes in the right hemithorax.  Chronic interstitial markings/emphysematous changes.   Original Report Authenticated By: Charline Bills, M.D.    Dg Chest Port 1 View  09/23/2012  *RADIOLOGY REPORT*  Clinical Data: Cough, shortness of breath, follow up of infiltrate  PORTABLE CHEST - 1 VIEW  Comparison: Chest x-ray of 09/22/2012  Findings: The lungs are not quite as well aerated.  There is slight opacity at the right lung base most consistent with atelectasis. Heart size is stable.  IMPRESSION: Diminished aeration.  Slight increase in opacity at the right lung base.   Original Report Authenticated By: Dwyane Dee, M.D.     2-D echo Pending   Microbiology: Recent Results (from the past 240 hour(s))  MRSA PCR SCREENING     Status: Normal   Collection Time   09/21/12  2:34 PM      Component Value Range Status Comment   MRSA by PCR NEGATIVE  NEGATIVE Final   CULTURE, BLOOD (ROUTINE X 2)     Status: Normal (Preliminary result)   Collection Time   09/21/12  3:01 PM      Component Value Range Status Comment   Specimen Description BLOOD RIGHT ARM   Final    Special Requests BOTTLES DRAWN AEROBIC AND ANAEROBIC 10CC   Final    Culture  Setup Time 09/21/2012 23:40   Final    Culture     Final    Value:        BLOOD CULTURE  RECEIVED NO GROWTH TO DATE CULTURE WILL BE HELD FOR 5 DAYS BEFORE ISSUING A FINAL NEGATIVE  REPORT   Report Status PENDING   Incomplete   CULTURE, BLOOD (ROUTINE X 2)     Status: Normal (Preliminary result)   Collection Time   09/21/12  3:06 PM      Component Value Range Status Comment   Specimen Description BLOOD RIGHT HAND   Final    Special Requests BOTTLES DRAWN AEROBIC ONLY 10CC   Final    Culture  Setup Time 09/21/2012 23:40   Final    Culture     Final    Value:        BLOOD CULTURE RECEIVED NO GROWTH TO DATE CULTURE WILL BE HELD FOR 5 DAYS BEFORE ISSUING A FINAL NEGATIVE REPORT   Report Status PENDING   Incomplete   CULTURE, EXPECTORATED SPUTUM-ASSESSMENT     Status: Normal   Collection Time   09/21/12  4:38 PM      Component Value Range Status Comment   Specimen Description SPUTUM   Final    Special Requests NONE   Final    Sputum evaluation     Final    Value: THIS SPECIMEN IS ACCEPTABLE. RESPIRATORY CULTURE REPORT TO FOLLOW.   Report Status 09/21/2012 FINAL   Final   CULTURE, RESPIRATORY     Status: Normal   Collection Time   09/21/12  4:38 PM      Component Value Range Status Comment   Specimen Description SPUTUM   Final    Special Requests NONE   Final    Gram Stain     Final    Value: ABUNDANT WBC PRESENT, PREDOMINANTLY PMN     RARE SQUAMOUS EPITHELIAL CELLS PRESENT     MODERATE GRAM NEGATIVE RODS     MODERATE GRAM POSITIVE COCCI IN PAIRS AND CHAINS     RARE GRAM POSITIVE RODS   Culture     Final    Value: ABUNDANT HAEMOPHILUS INFLUENZAE     Note: BETA LACTAMASE POSITIVE   Report Status 09/24/2012 FINAL   Final   RESPIRATORY VIRUS PANEL     Status: Abnormal   Collection Time   09/22/12  5:22 AM      Component Value Range Status Comment   Source - [facility] NASAL SWAB   Corrected CORRECTED ON 11/18 AT 1951: PREVIOUSLY REPORTED AS NASAL SWAB   Respiratory Syncytial Virus A NOT DETECTED   Final    Respiratory Syncytial Virus B NOT DETECTED   Final    Influenza A NOT  DETECTED   Final    Influenza B NOT DETECTED   Final    Parainfluenza 1 NOT DETECTED   Final    Parainfluenza 2 NOT DETECTED   Final    Parainfluenza 3 NOT DETECTED   Final    Metapneumovirus NOT DETECTED   Final    Rhinovirus DETECTED (*)  Final    Adenovirus NOT DETECTED   Final    Influenza A H1 NOT DETECTED   Final    Influenza A H3 NOT DETECTED   Final      Labs: Results for orders placed during the hospital encounter of 09/21/12 (from the past 48 hour(s))  GLUCOSE, CAPILLARY     Status: Abnormal   Collection Time   09/22/12 12:09 PM      Component Value Range Comment   Glucose-Capillary 184 (*) 70 - 99 mg/dL    Comment 1 Documented in Chart      Comment 2 Notify RN     INFLUENZA PANEL BY PCR  Status: Normal   Collection Time   09/22/12  2:07 PM      Component Value Range Comment   Influenza A By PCR NEGATIVE  NEGATIVE    Influenza B By PCR NEGATIVE  NEGATIVE    H1N1 flu by pcr NOT DETECTED  NOT DETECTED   CALCIUM, URINE, RANDOM     Status: Normal   Collection Time   09/22/12  3:00 PM      Component Value Range Comment   Calcium, Ur <1   Result repeated and verified.  GLUCOSE, CAPILLARY     Status: Abnormal   Collection Time   09/22/12  5:12 PM      Component Value Range Comment   Glucose-Capillary 298 (*) 70 - 99 mg/dL    Comment 1 Notify RN     GLUCOSE, CAPILLARY     Status: Abnormal   Collection Time   09/22/12 10:06 PM      Component Value Range Comment   Glucose-Capillary 255 (*) 70 - 99 mg/dL   BASIC METABOLIC PANEL     Status: Abnormal   Collection Time   09/23/12  5:40 AM      Component Value Range Comment   Sodium 140  135 - 145 mEq/L    Potassium 3.6  3.5 - 5.1 mEq/L    Chloride 103  96 - 112 mEq/L    CO2 24  19 - 32 mEq/L    Glucose, Bld 156 (*) 70 - 99 mg/dL    BUN 12  6 - 23 mg/dL    Creatinine, Ser 4.09  0.50 - 1.35 mg/dL    Calcium 8.8  8.4 - 81.1 mg/dL    GFR calc non Af Amer >90  >90 mL/min    GFR calc Af Amer >90  >90 mL/min   CBC      Status: Abnormal   Collection Time   09/23/12  5:40 AM      Component Value Range Comment   WBC 11.8 (*) 4.0 - 10.5 K/uL    RBC 3.59 (*) 4.22 - 5.81 MIL/uL    Hemoglobin 10.2 (*) 13.0 - 17.0 g/dL    HCT 91.4 (*) 78.2 - 52.0 %    MCV 86.1  78.0 - 100.0 fL    MCH 28.4  26.0 - 34.0 pg    MCHC 33.0  30.0 - 36.0 g/dL    RDW 95.6  21.3 - 08.6 %    Platelets 263  150 - 400 K/uL   GLUCOSE, CAPILLARY     Status: Abnormal   Collection Time   09/23/12  8:19 AM      Component Value Range Comment   Glucose-Capillary 126 (*) 70 - 99 mg/dL   GLUCOSE, CAPILLARY     Status: Abnormal   Collection Time   09/23/12 11:49 AM      Component Value Range Comment   Glucose-Capillary 169 (*) 70 - 99 mg/dL   MAGNESIUM     Status: Abnormal   Collection Time   09/23/12  1:56 PM      Component Value Range Comment   Magnesium 1.2 (*) 1.5 - 2.5 mg/dL   GLUCOSE, CAPILLARY     Status: Abnormal   Collection Time   09/23/12  4:23 PM      Component Value Range Comment   Glucose-Capillary 265 (*) 70 - 99 mg/dL   GLUCOSE, CAPILLARY     Status: Abnormal   Collection Time   09/23/12 10:10 PM  Component Value Range Comment   Glucose-Capillary 335 (*) 70 - 99 mg/dL    Comment 1 Notify RN      Comment 2 Documented in Chart     CBC     Status: Abnormal   Collection Time   09/24/12  6:10 AM      Component Value Range Comment   WBC 10.9 (*) 4.0 - 10.5 K/uL    RBC 3.84 (*) 4.22 - 5.81 MIL/uL    Hemoglobin 11.1 (*) 13.0 - 17.0 g/dL    HCT 45.4 (*) 09.8 - 52.0 %    MCV 84.6  78.0 - 100.0 fL    MCH 28.9  26.0 - 34.0 pg    MCHC 34.2  30.0 - 36.0 g/dL    RDW 11.9  14.7 - 82.9 %    Platelets 324  150 - 400 K/uL   BASIC METABOLIC PANEL     Status: Abnormal   Collection Time   09/24/12  6:10 AM      Component Value Range Comment   Sodium 136  135 - 145 mEq/L    Potassium 4.4  3.5 - 5.1 mEq/L    Chloride 99  96 - 112 mEq/L    CO2 26  19 - 32 mEq/L    Glucose, Bld 292 (*) 70 - 99 mg/dL    BUN 14  6 - 23  mg/dL    Creatinine, Ser 5.62  0.50 - 1.35 mg/dL    Calcium 9.7  8.4 - 13.0 mg/dL    GFR calc non Af Amer >90  >90 mL/min    GFR calc Af Amer >90  >90 mL/min   GLUCOSE, CAPILLARY     Status: Abnormal   Collection Time   09/24/12  7:58 AM      Component Value Range Comment   Glucose-Capillary 276 (*) 70 - 99 mg/dL    Comment 1 Notify RN        HPI : 61 year old male patient with known history of COPD as well as prior lobectomy for non-small cell carcinoma in May 2013. He presented to the emergency part with complaints of coughing x3 days with productive green sputum. Symptoms initially began after he had been working outside and was exposed to freshly mowed grass and old leaves. Later he developed diarrhea. Eventually he progressed to experiencing shortness of breath that increased to the point he felt he needed to seek medical attention. He normally has nocturnal oxygen to use with his CPAP for sleep apnea. In addition he was having anorexia. Most limiting symptom was new dyspnea on exertion. He had been exposed to his grandson who had what sounds like a viral illness. Upon evaluation in the emergency department he was tachypneic and had relative hypotension that responded to IV fluid administration. An ABG had been performed it demonstrated possible hypoxemia with a PO2 of 57 but review of room air ABGs from 2013 revealed a baseline PO2 between 50 and 60. He did have mild leukocytosis. Lactic acid was normal at 1.5. It was felt the patient had a viral respiratory illness and was subsequently admitted to the step down unit for further monitoring and treatment.   HOSPITAL COURSE: #1 tracheobronchitis Continue levofloxacin for another 7 days Influenza panel negative Continue nebulizer treatments Continue steroid taper Will assess home oxygen needs of less than 90% home oxygen will be set up  #2 history of non-small cell lung cancer status post lobectomy Patient has a followup CAT scan in  December,  chest x-ray stable  #3 hypertension Blood pressure well controlled continue current regimen 2-D echo pending   #4 diabetes type 2 Poorly controlled Continue Lantus and sliding scale insulin  #5 hypomagnesemia ranging from 1.1-1.2 despite repletion Oral magnesium has been increased to 500 mg by mouth 4 times a day The need to check in about a week  #6 COPD/sleep apnea continue nebulizer treatments, CPAP at home    Discharge Exam:  Blood pressure 134/79, pulse 61, temperature 97.7 F (36.5 C), temperature source Oral, resp. rate 19, height 5' 10.87" (1.8 m), weight 130.4 kg (287 lb 7.7 oz), SpO2 93.00%.  General: No acute respiratory distress  Lungs: Clear to auscultation bilaterally without wheezes or crackles, nasal cannula oxygen  Cardiovascular: Regular rate and rhythm without murmur gallop or rub normal S1 and S2  Abdomen: Nontender, nondistended, soft, bowel sounds positive, no rebound, no ascites, no appreciable mass  Extremities: No significant cyanosis, clubbing, or edema bilateral lower extremities        Discharge Orders    Future Appointments: Provider: Department: Dept Phone: Center:   10/07/2012 12:30 PM Alleen Borne, MD [facility] [phone] [id number]        Signed: Richarda Overlie 09/24/2012, 10:04 AM

## 2012-09-24 NOTE — Progress Notes (Signed)
  Echocardiogram 2D Echocardiogram has been performed.  Cathie Beams 09/24/2012, 12:07 PM

## 2012-09-26 ENCOUNTER — Encounter (HOSPITAL_COMMUNITY): Payer: Self-pay | Admitting: *Deleted

## 2012-09-26 ENCOUNTER — Inpatient Hospital Stay (HOSPITAL_COMMUNITY)
Admission: EM | Admit: 2012-09-26 | Discharge: 2012-09-29 | DRG: 392 | Disposition: A | Discharge status: Home / Self Care | Payer: Managed Care, Other (non HMO) | Attending: Internal Medicine | Admitting: Internal Medicine

## 2012-09-26 ENCOUNTER — Inpatient Hospital Stay (HOSPITAL_COMMUNITY): Payer: Managed Care, Other (non HMO)

## 2012-09-26 DIAGNOSIS — Z9889 Other specified postprocedural states: Secondary | ICD-10-CM

## 2012-09-26 DIAGNOSIS — N189 Chronic kidney disease, unspecified: Secondary | ICD-10-CM | POA: Diagnosis present

## 2012-09-26 DIAGNOSIS — A09 Infectious gastroenteritis and colitis, unspecified: Principal | ICD-10-CM | POA: Diagnosis present

## 2012-09-26 DIAGNOSIS — J449 Chronic obstructive pulmonary disease, unspecified: Secondary | ICD-10-CM | POA: Diagnosis present

## 2012-09-26 DIAGNOSIS — Z9981 Dependence on supplemental oxygen: Secondary | ICD-10-CM

## 2012-09-26 DIAGNOSIS — Z01811 Encounter for preprocedural respiratory examination: Secondary | ICD-10-CM

## 2012-09-26 DIAGNOSIS — J9600 Acute respiratory failure, unspecified whether with hypoxia or hypercapnia: Secondary | ICD-10-CM

## 2012-09-26 DIAGNOSIS — E079 Disorder of thyroid, unspecified: Secondary | ICD-10-CM | POA: Diagnosis present

## 2012-09-26 DIAGNOSIS — R109 Unspecified abdominal pain: Secondary | ICD-10-CM | POA: Diagnosis present

## 2012-09-26 DIAGNOSIS — E876 Hypokalemia: Secondary | ICD-10-CM

## 2012-09-26 DIAGNOSIS — F329 Major depressive disorder, single episode, unspecified: Secondary | ICD-10-CM

## 2012-09-26 DIAGNOSIS — I2510 Atherosclerotic heart disease of native coronary artery without angina pectoris: Secondary | ICD-10-CM | POA: Diagnosis present

## 2012-09-26 DIAGNOSIS — K529 Noninfective gastroenteritis and colitis, unspecified: Secondary | ICD-10-CM

## 2012-09-26 DIAGNOSIS — E669 Obesity, unspecified: Secondary | ICD-10-CM | POA: Diagnosis present

## 2012-09-26 DIAGNOSIS — Z885 Allergy status to narcotic agent status: Secondary | ICD-10-CM

## 2012-09-26 DIAGNOSIS — R000 Tachycardia, unspecified: Secondary | ICD-10-CM

## 2012-09-26 DIAGNOSIS — IMO0001 Reserved for inherently not codable concepts without codable children: Secondary | ICD-10-CM | POA: Diagnosis present

## 2012-09-26 DIAGNOSIS — E785 Hyperlipidemia, unspecified: Secondary | ICD-10-CM | POA: Diagnosis present

## 2012-09-26 DIAGNOSIS — R0789 Other chest pain: Secondary | ICD-10-CM

## 2012-09-26 DIAGNOSIS — Z9861 Coronary angioplasty status: Secondary | ICD-10-CM

## 2012-09-26 DIAGNOSIS — Z7982 Long term (current) use of aspirin: Secondary | ICD-10-CM

## 2012-09-26 DIAGNOSIS — Z902 Acquired absence of lung [part of]: Secondary | ICD-10-CM

## 2012-09-26 DIAGNOSIS — T7840XA Allergy, unspecified, initial encounter: Secondary | ICD-10-CM

## 2012-09-26 DIAGNOSIS — Z79899 Other long term (current) drug therapy: Secondary | ICD-10-CM

## 2012-09-26 DIAGNOSIS — J180 Bronchopneumonia, unspecified organism: Secondary | ICD-10-CM

## 2012-09-26 DIAGNOSIS — R197 Diarrhea, unspecified: Secondary | ICD-10-CM | POA: Diagnosis present

## 2012-09-26 DIAGNOSIS — G609 Hereditary and idiopathic neuropathy, unspecified: Secondary | ICD-10-CM | POA: Diagnosis present

## 2012-09-26 DIAGNOSIS — R6889 Other general symptoms and signs: Secondary | ICD-10-CM

## 2012-09-26 DIAGNOSIS — D360 Benign neoplasm of lymph nodes: Secondary | ICD-10-CM

## 2012-09-26 DIAGNOSIS — G5793 Unspecified mononeuropathy of bilateral lower limbs: Secondary | ICD-10-CM

## 2012-09-26 DIAGNOSIS — K219 Gastro-esophageal reflux disease without esophagitis: Secondary | ICD-10-CM | POA: Diagnosis present

## 2012-09-26 DIAGNOSIS — R42 Dizziness and giddiness: Secondary | ICD-10-CM

## 2012-09-26 DIAGNOSIS — N186 End stage renal disease: Secondary | ICD-10-CM

## 2012-09-26 DIAGNOSIS — E869 Volume depletion, unspecified: Secondary | ICD-10-CM

## 2012-09-26 DIAGNOSIS — E119 Type 2 diabetes mellitus without complications: Secondary | ICD-10-CM

## 2012-09-26 DIAGNOSIS — G4733 Obstructive sleep apnea (adult) (pediatric): Secondary | ICD-10-CM | POA: Diagnosis present

## 2012-09-26 DIAGNOSIS — C3490 Malignant neoplasm of unspecified part of unspecified bronchus or lung: Secondary | ICD-10-CM

## 2012-09-26 DIAGNOSIS — Z794 Long term (current) use of insulin: Secondary | ICD-10-CM

## 2012-09-26 DIAGNOSIS — Z8249 Family history of ischemic heart disease and other diseases of the circulatory system: Secondary | ICD-10-CM

## 2012-09-26 DIAGNOSIS — F3289 Other specified depressive episodes: Secondary | ICD-10-CM | POA: Diagnosis present

## 2012-09-26 DIAGNOSIS — I252 Old myocardial infarction: Secondary | ICD-10-CM

## 2012-09-26 DIAGNOSIS — E860 Dehydration: Secondary | ICD-10-CM | POA: Diagnosis present

## 2012-09-26 DIAGNOSIS — J9601 Acute respiratory failure with hypoxia: Secondary | ICD-10-CM

## 2012-09-26 DIAGNOSIS — Y92009 Unspecified place in unspecified non-institutional (private) residence as the place of occurrence of the external cause: Secondary | ICD-10-CM

## 2012-09-26 DIAGNOSIS — K5792 Diverticulitis of intestine, part unspecified, without perforation or abscess without bleeding: Secondary | ICD-10-CM

## 2012-09-26 DIAGNOSIS — Z9989 Dependence on other enabling machines and devices: Secondary | ICD-10-CM | POA: Diagnosis present

## 2012-09-26 DIAGNOSIS — Z85118 Personal history of other malignant neoplasm of bronchus and lung: Secondary | ICD-10-CM

## 2012-09-26 DIAGNOSIS — N179 Acute kidney failure, unspecified: Secondary | ICD-10-CM | POA: Diagnosis present

## 2012-09-26 DIAGNOSIS — R0602 Shortness of breath: Secondary | ICD-10-CM

## 2012-09-26 DIAGNOSIS — J441 Chronic obstructive pulmonary disease with (acute) exacerbation: Secondary | ICD-10-CM | POA: Diagnosis present

## 2012-09-26 DIAGNOSIS — M19049 Primary osteoarthritis, unspecified hand: Secondary | ICD-10-CM | POA: Diagnosis present

## 2012-09-26 DIAGNOSIS — R112 Nausea with vomiting, unspecified: Secondary | ICD-10-CM | POA: Diagnosis present

## 2012-09-26 DIAGNOSIS — Z6838 Body mass index (BMI) 38.0-38.9, adult: Secondary | ICD-10-CM

## 2012-09-26 DIAGNOSIS — K449 Diaphragmatic hernia without obstruction or gangrene: Secondary | ICD-10-CM | POA: Diagnosis present

## 2012-09-26 DIAGNOSIS — F17200 Nicotine dependence, unspecified, uncomplicated: Secondary | ICD-10-CM | POA: Diagnosis present

## 2012-09-26 DIAGNOSIS — K921 Melena: Secondary | ICD-10-CM

## 2012-09-26 DIAGNOSIS — R1013 Epigastric pain: Secondary | ICD-10-CM

## 2012-09-26 DIAGNOSIS — I10 Essential (primary) hypertension: Secondary | ICD-10-CM

## 2012-09-26 DIAGNOSIS — J069 Acute upper respiratory infection, unspecified: Secondary | ICD-10-CM

## 2012-09-26 DIAGNOSIS — D72829 Elevated white blood cell count, unspecified: Secondary | ICD-10-CM | POA: Diagnosis present

## 2012-09-26 DIAGNOSIS — G4730 Sleep apnea, unspecified: Secondary | ICD-10-CM

## 2012-09-26 DIAGNOSIS — R739 Hyperglycemia, unspecified: Secondary | ICD-10-CM

## 2012-09-26 DIAGNOSIS — D649 Anemia, unspecified: Secondary | ICD-10-CM | POA: Diagnosis present

## 2012-09-26 DIAGNOSIS — T380X5A Adverse effect of glucocorticoids and synthetic analogues, initial encounter: Secondary | ICD-10-CM | POA: Diagnosis present

## 2012-09-26 DIAGNOSIS — I129 Hypertensive chronic kidney disease with stage 1 through stage 4 chronic kidney disease, or unspecified chronic kidney disease: Secondary | ICD-10-CM | POA: Diagnosis present

## 2012-09-26 LAB — COMPREHENSIVE METABOLIC PANEL
AST: 10 U/L (ref 0–37)
Albumin: 2.8 g/dL — ABNORMAL LOW (ref 3.5–5.2)
BUN: 42 mg/dL — ABNORMAL HIGH (ref 6–23)
BUN: 35 mg/dL — ABNORMAL HIGH (ref 6–23)
CO2: 23 mEq/L (ref 19–32)
Calcium: 9.1 mg/dL (ref 8.4–10.5)
Chloride: 95 mEq/L — ABNORMAL LOW (ref 96–112)
Creatinine, Ser: 1.36 mg/dL — ABNORMAL HIGH (ref 0.50–1.35)
Creatinine, Ser: 1.11 mg/dL (ref 0.50–1.35)
GFR calc non Af Amer: 54 mL/min — ABNORMAL LOW (ref >90)
Glucose, Bld: 305 mg/dL — ABNORMAL HIGH (ref 70–99)
Potassium: 4.2 mEq/L (ref 3.5–5.1)
Total Bilirubin: 0.3 mg/dL (ref 0.3–1.2)
Total Protein: 6.3 g/dL (ref 6.0–8.3)

## 2012-09-26 LAB — CLOSTRIDIUM DIFFICILE BY PCR: Toxigenic C. Difficile by PCR: NEGATIVE

## 2012-09-26 LAB — CBC WITH DIFFERENTIAL/PLATELET
Basophils Absolute: 0 10*3/uL (ref 0.0–0.1)
HCT: 39.3 % (ref 39.0–52.0)
Hemoglobin: 13.5 g/dL (ref 13.0–17.0)
Lymphocytes Relative: 7 % — ABNORMAL LOW (ref 12–46)
Lymphs Abs: 1.5 10*3/uL (ref 0.7–4.0)
Monocytes Absolute: 0.8 10*3/uL (ref 0.1–1.0)
Monocytes Relative: 4 % (ref 3–12)
Neutro Abs: 19.4 10*3/uL — ABNORMAL HIGH (ref 1.7–7.7)
RBC: 4.63 MIL/uL (ref 4.22–5.81)
WBC: 21.7 10*3/uL — ABNORMAL HIGH (ref 4.0–10.5)

## 2012-09-26 LAB — GLUCOSE, CAPILLARY
Glucose-Capillary: 151 mg/dL — ABNORMAL HIGH (ref 70–99)
Glucose-Capillary: 195 mg/dL — ABNORMAL HIGH (ref 70–99)
Glucose-Capillary: 196 mg/dL — ABNORMAL HIGH (ref 70–99)

## 2012-09-26 LAB — LACTIC ACID, PLASMA: Lactic Acid, Venous: 1.3 mmol/L (ref 0.5–2.2)

## 2012-09-26 LAB — TYPE AND SCREEN: Antibody Screen: NEGATIVE

## 2012-09-26 MED ORDER — SODIUM CHLORIDE 0.9 % IV SOLN
INTRAVENOUS | Status: AC
  Administered 2012-09-26: 11:00:00 via INTRAVENOUS

## 2012-09-26 MED ORDER — SODIUM CHLORIDE 0.9 % IJ SOLN
3.0000 mL | Freq: Two times a day (BID) | INTRAMUSCULAR | Status: DC
  Administered 2012-09-26 – 2012-09-29 (×5): 3 mL via INTRAVENOUS

## 2012-09-26 MED ORDER — CIPROFLOXACIN IN D5W 400 MG/200ML IV SOLN
400.0000 mg | Freq: Two times a day (BID) | INTRAVENOUS | Status: DC
  Filled 2012-09-26: qty 200

## 2012-09-26 MED ORDER — INSULIN GLARGINE 100 UNIT/ML ~~LOC~~ SOLN
80.0000 [IU] | Freq: Every morning | SUBCUTANEOUS | Status: DC
Start: 2012-09-26 — End: 2012-09-27
  Administered 2012-09-26: 80 [IU] via SUBCUTANEOUS

## 2012-09-26 MED ORDER — ALBUTEROL SULFATE (5 MG/ML) 0.5% IN NEBU
2.5000 mg | INHALATION_SOLUTION | RESPIRATORY_TRACT | Status: DC
  Administered 2012-09-26: 2.5 mg via RESPIRATORY_TRACT
  Filled 2012-09-26 (×2): qty 0.5

## 2012-09-26 MED ORDER — DILTIAZEM HCL ER COATED BEADS 120 MG PO CP24
120.0000 mg | ORAL_CAPSULE | Freq: Every day | ORAL | Status: DC
  Administered 2012-09-26 – 2012-09-29 (×4): 120 mg via ORAL
  Filled 2012-09-26 (×4): qty 1

## 2012-09-26 MED ORDER — ACETAMINOPHEN 650 MG RE SUPP: 650.0000 mg | Freq: Four times a day (QID) | RECTAL | Status: DC | PRN

## 2012-09-26 MED ORDER — IOHEXOL 350 MG/ML SOLN
100.0000 mL | Freq: Once | INTRAVENOUS | Status: AC | PRN
  Administered 2012-09-26: 100 mL via INTRAVENOUS

## 2012-09-26 MED ORDER — INSULIN ASPART 100 UNIT/ML ~~LOC~~ SOLN
5.0000 [IU] | Freq: Once | SUBCUTANEOUS | Status: AC
  Administered 2012-09-26: 5 [IU] via SUBCUTANEOUS
  Filled 2012-09-26: qty 1

## 2012-09-26 MED ORDER — ONDANSETRON HCL 4 MG/2ML IJ SOLN
4.0000 mg | Freq: Once | INTRAMUSCULAR | Status: AC
  Administered 2012-09-26: 4 mg via INTRAVENOUS
  Filled 2012-09-26: qty 2

## 2012-09-26 MED ORDER — ONDANSETRON HCL 4 MG/2ML IJ SOLN: 4.0000 mg | Freq: Four times a day (QID) | INTRAMUSCULAR | Status: DC | PRN

## 2012-09-26 MED ORDER — LEVALBUTEROL HCL 0.63 MG/3ML IN NEBU
0.6300 mg | INHALATION_SOLUTION | Freq: Four times a day (QID) | RESPIRATORY_TRACT | Status: DC | PRN
  Filled 2012-09-26: qty 3

## 2012-09-26 MED ORDER — BUDESONIDE 0.5 MG/2ML IN SUSP
0.5000 mg | Freq: Two times a day (BID) | RESPIRATORY_TRACT | Status: DC
  Administered 2012-09-26 – 2012-09-29 (×7): 0.5 mg via RESPIRATORY_TRACT
  Filled 2012-09-26 (×9): qty 2

## 2012-09-26 MED ORDER — MAGNESIUM GLUCONATE 500 MG PO TABS
500.0000 mg | ORAL_TABLET | Freq: Three times a day (TID) | ORAL | Status: DC
  Administered 2012-09-26 (×3): 500 mg via ORAL
  Administered 2012-09-27 (×2): via ORAL
  Administered 2012-09-27 (×2): 500 mg via ORAL
  Administered 2012-09-28: 12:00:00 via ORAL
  Administered 2012-09-28: 500 mg via ORAL
  Administered 2012-09-28: 09:00:00 via ORAL
  Administered 2012-09-28 – 2012-09-29 (×3): 500 mg via ORAL
  Filled 2012-09-26 (×18): qty 1

## 2012-09-26 MED ORDER — VITAMIN B-1 100 MG PO TABS
100.0000 mg | ORAL_TABLET | Freq: Every day | ORAL | Status: DC
  Administered 2012-09-26 – 2012-09-29 (×4): 100 mg via ORAL
  Filled 2012-09-26 (×4): qty 1

## 2012-09-26 MED ORDER — IPRATROPIUM BROMIDE 0.02 % IN SOLN
0.5000 mg | RESPIRATORY_TRACT | Status: DC
  Administered 2012-09-26 – 2012-09-27 (×3): 0.5 mg via RESPIRATORY_TRACT
  Filled 2012-09-26 (×3): qty 2.5

## 2012-09-26 MED ORDER — MORPHINE SULFATE 2 MG/ML IJ SOLN
2.0000 mg | INTRAMUSCULAR | Status: DC | PRN
  Administered 2012-09-26 – 2012-09-28 (×5): 2 mg via INTRAVENOUS
  Filled 2012-09-26 (×6): qty 1

## 2012-09-26 MED ORDER — INSULIN ASPART 100 UNIT/ML ~~LOC~~ SOLN
0.0000 [IU] | SUBCUTANEOUS | Status: DC
Start: 2012-09-26 — End: 2012-09-29
  Administered 2012-09-26: 3 [IU] via SUBCUTANEOUS
  Administered 2012-09-26 (×2): 2 [IU] via SUBCUTANEOUS
  Administered 2012-09-27: 13:00:00 via SUBCUTANEOUS
  Administered 2012-09-27: 5 [IU] via SUBCUTANEOUS
  Administered 2012-09-27: 2 [IU] via SUBCUTANEOUS
  Administered 2012-09-27: 5 [IU] via SUBCUTANEOUS
  Administered 2012-09-28: 2 [IU] via SUBCUTANEOUS
  Administered 2012-09-28: 9 [IU] via SUBCUTANEOUS
  Administered 2012-09-28: 2 [IU] via SUBCUTANEOUS
  Administered 2012-09-28: 3 [IU] via SUBCUTANEOUS
  Administered 2012-09-28: 1 [IU] via SUBCUTANEOUS
  Administered 2012-09-28: 9 [IU] via SUBCUTANEOUS
  Administered 2012-09-29: 2 [IU] via SUBCUTANEOUS
  Administered 2012-09-29: 9 [IU] via SUBCUTANEOUS
  Administered 2012-09-29: 1 [IU] via SUBCUTANEOUS

## 2012-09-26 MED ORDER — ALBUTEROL SULFATE (5 MG/ML) 0.5% IN NEBU: 2.5000 mg | INHALATION_SOLUTION | RESPIRATORY_TRACT | Status: DC | PRN

## 2012-09-26 MED ORDER — PANTOPRAZOLE SODIUM 40 MG IV SOLR
40.0000 mg | Freq: Every day | INTRAVENOUS | Status: DC
  Administered 2012-09-26 – 2012-09-27 (×2): 40 mg via INTRAVENOUS
  Filled 2012-09-26 (×3): qty 40

## 2012-09-26 MED ORDER — FAMOTIDINE IN NACL 20-0.9 MG/50ML-% IV SOLN
20.0000 mg | Freq: Once | INTRAVENOUS | Status: AC
  Administered 2012-09-26: 20 mg via INTRAVENOUS
  Filled 2012-09-26: qty 50

## 2012-09-26 MED ORDER — IPRATROPIUM BROMIDE 0.02 % IN SOLN
0.5000 mg | RESPIRATORY_TRACT | Status: DC
  Administered 2012-09-26: 0.5 mg via RESPIRATORY_TRACT
  Filled 2012-09-26 (×2): qty 2.5

## 2012-09-26 MED ORDER — ONDANSETRON HCL 4 MG PO TABS: 4.0000 mg | ORAL_TABLET | Freq: Four times a day (QID) | ORAL | Status: DC | PRN

## 2012-09-26 MED ORDER — TRAZODONE HCL 100 MG PO TABS
100.0000 mg | ORAL_TABLET | Freq: Every day | ORAL | Status: DC
  Administered 2012-09-26 – 2012-09-28 (×3): 100 mg via ORAL
  Filled 2012-09-26 (×4): qty 1

## 2012-09-26 MED ORDER — METRONIDAZOLE IN NACL 5-0.79 MG/ML-% IV SOLN
500.0000 mg | Freq: Once | INTRAVENOUS | Status: AC
  Administered 2012-09-26: 500 mg via INTRAVENOUS
  Filled 2012-09-26: qty 100

## 2012-09-26 MED ORDER — SODIUM CHLORIDE 0.9 % IV BOLUS (SEPSIS)
1000.0000 mL | Freq: Once | INTRAVENOUS | Status: AC
  Administered 2012-09-26: 1000 mL via INTRAVENOUS

## 2012-09-26 MED ORDER — MORPHINE SULFATE 2 MG/ML IJ SOLN
1.0000 mg | INTRAMUSCULAR | Status: DC | PRN
  Administered 2012-09-26: 1 mg via INTRAVENOUS
  Filled 2012-09-26: qty 1

## 2012-09-26 MED ORDER — MORPHINE SULFATE 4 MG/ML IJ SOLN
4.0000 mg | Freq: Once | INTRAMUSCULAR | Status: AC
  Administered 2012-09-26: 4 mg via INTRAVENOUS
  Filled 2012-09-26: qty 1

## 2012-09-26 MED ORDER — INSULIN ASPART 100 UNIT/ML ~~LOC~~ SOLN: 0.0000 [IU] | Freq: Three times a day (TID) | SUBCUTANEOUS | Status: DC

## 2012-09-26 MED ORDER — CIPROFLOXACIN IN D5W 400 MG/200ML IV SOLN
400.0000 mg | Freq: Two times a day (BID) | INTRAVENOUS | Status: DC
  Administered 2012-09-26 – 2012-09-29 (×6): 400 mg via INTRAVENOUS
  Filled 2012-09-26 (×10): qty 200

## 2012-09-26 MED ORDER — CIPROFLOXACIN IN D5W 200 MG/100ML IV SOLN
200.0000 mg | Freq: Once | INTRAVENOUS | Status: AC
  Administered 2012-09-26: 200 mg via INTRAVENOUS
  Filled 2012-09-26: qty 100

## 2012-09-26 MED ORDER — LEVALBUTEROL HCL 0.63 MG/3ML IN NEBU
0.6300 mg | INHALATION_SOLUTION | Freq: Four times a day (QID) | RESPIRATORY_TRACT | Status: DC
  Administered 2012-09-26 – 2012-09-28 (×7): 0.63 mg via RESPIRATORY_TRACT
  Filled 2012-09-26 (×11): qty 3

## 2012-09-26 MED ORDER — ACETAMINOPHEN 325 MG PO TABS: 650.0000 mg | ORAL_TABLET | Freq: Four times a day (QID) | ORAL | Status: DC | PRN

## 2012-09-26 MED ORDER — MAGNESIUM 250 MG PO TABS: 500.0000 mg | ORAL_TABLET | Freq: Four times a day (QID) | ORAL | Status: DC

## 2012-09-26 MED ORDER — METRONIDAZOLE IN NACL 5-0.79 MG/ML-% IV SOLN
500.0000 mg | Freq: Three times a day (TID) | INTRAVENOUS | Status: DC
  Administered 2012-09-26 – 2012-09-29 (×10): 500 mg via INTRAVENOUS
  Filled 2012-09-26 (×16): qty 100

## 2012-09-26 MED ORDER — METHYLPREDNISOLONE SODIUM SUCC 40 MG IJ SOLR
20.0000 mg | Freq: Every day | INTRAMUSCULAR | Status: DC
  Administered 2012-09-26 – 2012-09-27 (×2): 20 mg via INTRAVENOUS
  Filled 2012-09-26 (×2): qty 0.5

## 2012-09-26 NOTE — H&P (Signed)
[name] is an 61 y.o. male.   Patient was seen and examined on September 26, 2012. Pulmonologist - Dr. Kalman Shan. Chief Complaint: Abdominal pain. HPI: 61 year-old male with history of COPD and home oxygen, OSA on CPAP, CAD status post stenting, history of lung cancer status post lobectomy who was just recently discharged from hospital 2 days ago presents with complaint of persistent abdominal pain. Patient's abdominal pain is mostly suprapubic and left lower quadrant. It is colicky in nature. It is associated nausea vomiting and diarrhea. Patient's diarrhea sometimes bloody. Denies any fever chills. Patient's recent admission which was later antibiotics for bronchitis. In the ER patient was found to have significant leukocytosis but his hemoglobin was stable. Patient was afebrile. On exam patient had tenderness in the left lower quadrant and suprapubic area. At this time patient has been admitted for possible diverticulitis versus colitis. CAT scan abdomen and pelvis has been ordered and has been pending. Patient is not in any respiratory distress and denies chest pain. Patient denies using any NSAIDs.  Past Medical History  Diagnosis Date  . Anemia   . Allergy   . Thyroid disease   . Parathyroid disease   . Headache   . Hypertension     takes  HYzaar daily  . Coronary artery disease     has 1 stent  . Myocardial infarct   . Asthma   . Emphysema     sees Dr.Ramaswami for this  . COPD (chronic obstructive pulmonary disease)     uses Albuterol and Spiriva daily  . Shortness of breath     with exertion   . Bronchitis   . Productive cough     white in color but no odor  . Sleep apnea     uses BiPaP  . Peripheral neuropathy   . Lung mass     right upper lobe  . Back pain     4 deteriorating disc and receives an injection q3-75mon;has been doing this for about 93yrs  . H/O hiatal hernia   . GERD (gastroesophageal reflux disease)     takes Prilosec daily  . Blood  transfusion     as a child  . Glaucoma(365)     hx of  . Depression     takes Prozac daily  . Insomnia     takes Trazodone nightly  . Anginal pain   . Chronic kidney disease     acute kidney failure post surgery  . Pneumonia     hx of' 61 yo,rd' last time in 2012  . Type II diabetes mellitus     takes Metformin bid and Novolog and Lantus daily  . Arthritis     "hands"    Past Surgical History  Procedure Date  . Carpal tunnel release     bilateral  . Rotator cuff repair     left  . Colonoscopy   . Esophagogastroduodenoscopy   . Eye surgery   . Lobectomy 03/17/2012    upper right side with resection  . Elbow surgery     ulnar nerve; left  . Cardiac catheterization 2006/2008/2009  . Coronary angioplasty with stent placement     1 stent  . Refractive surgery     bilaterally  . Inguinal hernia repair 1990's    double inguinal    Family History  Problem Relation Age of Onset  . COPD Mother   . Heart attack Father   . Anesthesia problems Neg Hx   .  Hypotension Neg Hx   . Malignant hyperthermia Neg Hx   . Pseudochol deficiency Neg Hx    Social History:  reports that he has been smoking Cigarettes.  He has a 45 pack-year smoking history. He has never used smokeless tobacco. He reports that he drinks alcohol. He reports that he uses illicit drugs (Cocaine).  Allergies:  Allergies  Allergen Reactions  . Dilaudid (Hydromorphone Hcl)     Made him crazy     (Not in a hospital admission)  Results for orders placed during the hospital encounter of 09/26/12 (from the past 48 hour(s))  CBC WITH DIFFERENTIAL     Status: Abnormal   Collection Time   09/26/12  2:15 AM      Component Value Range Comment   WBC 21.7 (*) 4.0 - 10.5 K/uL    RBC 4.63  4.22 - 5.81 MIL/uL    Hemoglobin 13.5  13.0 - 17.0 g/dL    HCT 40.9  81.1 - 91.4 %    MCV 84.9  78.0 - 100.0 fL    MCH 29.2  26.0 - 34.0 pg    MCHC 34.4  30.0 - 36.0 g/dL    RDW 78.2  95.6 - 21.3 %    Platelets 378  150 -  400 K/uL    Neutrophils Relative 90 (*) 43 - 77 %    Neutro Abs 19.4 (*) 1.7 - 7.7 K/uL    Lymphocytes Relative 7 (*) 12 - 46 %    Lymphs Abs 1.5  0.7 - 4.0 K/uL    Monocytes Relative 4  3 - 12 %    Monocytes Absolute 0.8  0.1 - 1.0 K/uL    Eosinophils Relative 0  0 - 5 %    Eosinophils Absolute 0.0  0.0 - 0.7 K/uL    Basophils Relative 0  0 - 1 %    Basophils Absolute 0.0  0.0 - 0.1 K/uL   COMPREHENSIVE METABOLIC PANEL     Status: Abnormal   Collection Time   09/26/12  2:15 AM      Component Value Range Comment   Sodium 131 (*) 135 - 145 mEq/L    Potassium 4.3  3.5 - 5.1 mEq/L    Chloride 95 (*) 96 - 112 mEq/L    CO2 23  19 - 32 mEq/L    Glucose, Bld 305 (*) 70 - 99 mg/dL    BUN 42 (*) 6 - 23 mg/dL DELTA CHECK NOTED   Creatinine, Ser 1.36 (*) 0.50 - 1.35 mg/dL DELTA CHECK NOTED   Calcium 9.2  8.4 - 10.5 mg/dL    Total Protein 6.6  6.0 - 8.3 g/dL    Albumin 2.9 (*) 3.5 - 5.2 g/dL    AST 10  0 - 37 U/L    ALT 32  0 - 53 U/L    Alkaline Phosphatase 69  39 - 117 U/L    Total Bilirubin 0.3  0.3 - 1.2 mg/dL    GFR calc non Af Amer 54 (*) >90 mL/min    GFR calc Af Amer 63 (*) >90 mL/min   TYPE AND SCREEN     Status: Normal   Collection Time   09/26/12  2:20 AM      Component Value Range Comment   ABO/RH(D) O POS      Antibody Screen NEG      Sample Expiration 09/29/2012      No results found.  Review of Systems  Constitutional: Negative.  HENT: Negative.   Eyes: Negative.   Respiratory: Negative.   Cardiovascular: Negative.   Gastrointestinal: Positive for nausea, vomiting, abdominal pain and diarrhea.  Genitourinary: Negative.   Musculoskeletal: Negative.   Skin: Negative.   Neurological: Negative.   Endo/Heme/Allergies: Negative.   Psychiatric/Behavioral: Negative.     Blood pressure 140/75, pulse 67, temperature 97.8 F (36.6 C), temperature source Oral, resp. rate 20, SpO2 98.00%. Physical Exam  Constitutional: He is oriented to person, place, and time. He  appears well-developed and well-nourished. No distress.  HENT:  Head: Normocephalic and atraumatic.  Right Ear: External ear normal.  Left Ear: External ear normal.  Nose: Nose normal.  Mouth/Throat: Oropharynx is clear and moist. No oropharyngeal exudate.  Eyes: Conjunctivae normal are normal. Pupils are equal, round, and reactive to light. Right eye exhibits no discharge. Left eye exhibits no discharge. No scleral icterus.  Neck: Normal range of motion. Neck supple.  Cardiovascular: Normal rate and regular rhythm.   Respiratory: Effort normal and breath sounds normal. No respiratory distress. He has no wheezes. He has no rales.  GI: Soft. Bowel sounds are normal. He exhibits no distension. There is no tenderness. There is no rebound and no guarding.  Musculoskeletal: He exhibits no edema and no tenderness.  Neurological: He is alert and oriented to person, place, and time.       Moves all extremities.  Skin: Skin is warm and dry. He is not diaphoretic.     Assessment/Plan #1. Abdominal pain nausea vomiting diarrhea at this time concerning for possible diverticulitis versus colitis - given that patient has bloody diarrhea we will check lactic acid levels and also CT abdomen and pelvis is pending. Until the CT results are available patient will be kept n.p.o. Continue with IV hydration. Patient has been started on Cipro and Flagyl which will be continued. #2. Acute renal failure - probably from dehydration. Recheck metabolic panel to see there is no worsening of his creatinine after hydration. Check UA. #3. COPD on home oxygen with recent treatment for bronchitis - patient is not in respiratory distress. Continue nebulizers. Patient was placed on prednisone by mouth for which I have ordered small dose of Solu-Medrol IV. #4. CAD status post stenting - denies any chest pain. #5. Diabetes mellitus2 uncontrolled - probably uncontrolled secondary to him being on prednisone. Continue his home dose  of Lantus. Since patient is n.p.o. I have ordered CBG every 4 hours. Closely follow CBGs. #6. Hypertension - continue home medications. #7. Hyperlipidemia - continue home medications. #8. History of lung cancer status post lobectomy. #9. OSA on CPAP - continue CPAP her respiratory.  I have ordered repeat metabolic panel and CBC. Closely follow labs to see if there is any worsening of his creatinine and also since patient is complaining bloody diarrhea closely follow hemoglobin. Check lactic acid levels. CT abdomen and pelvis is pending.  Patient at this time does not look septic.  CODE STATUS - full code.  KAKRAKANDY,ARSHAD N. 09/26/2012, 6:59 AM

## 2012-09-26 NOTE — ED Notes (Signed)
Pt presented to ED with PR bleed from yesterday afternoon.He was discharged from hospital yesterday.He also complains of abdominal pain.

## 2012-09-26 NOTE — ED Notes (Signed)
Wife no.[phone]

## 2012-09-26 NOTE — ED Provider Notes (Signed)
History     CSN: [id number]  Arrival date & time 09/26/12  0151   First MD Initiated Contact with Patient 09/26/12 0249      Chief Complaint  Patient presents with  . Rectal Bleeding  . Abdominal Pain  . Emesis    (Consider location/radiation/quality/duration/timing/severity/associated sxs/prior treatment) HPI  Please note that this is a late entry. This patient was seen and examined by me shortly after his arrival to the emergency department.  The patient is a pleasant 62 year old man with oxygen dependent diabetes, history of stage I non-small cell lung cancer, hypertension, coronary artery disease, type 2 diabetes, GERD.  [facility] just over 24 hours prior to this ED admission with a discharge diagnosis of acute respiratory failure with hypoxia and COPD. He was discharged on a prednisone taper from 30 mg and Levaquin 500 mg by mouth. Patient was feeling fine when he left the hospital yesterday, he says.  He presents today with complaints of left lower quadrant abdominal pain, nausea, vomiting, inability to keep down any by mouth intake including medications. He also notes grossly bloody stools times approximately 4-5 episodes. He is unable to quantify the amount of blood which he may have passed in his stool. He describes the blood as dark red. He has no history of GI bleeding. He has not appreciated a fever at home. Her has his wife. The patient's wife says that he had similar left lower quadrant abdominal pain with an episode of diverticulitis approximately 18 months ago. The patient has no history of abdominal surgeries.  The patient describes his pain as aching, cramping, 9/10 in severity at its most severe. He denies exacerbating or relieving factors. He denies genitourinary symptoms. His emesis has been nonbloody and nonbilious. His pain is nonradiating.    Past Medical History  Diagnosis Date  . Anemia   . Allergy   .  Thyroid disease   . Parathyroid disease   . Headache   . Hypertension     takes  HYzaar daily  . Coronary artery disease     has 1 stent  . Myocardial infarct   . Asthma   . Emphysema     sees Dr.Ramaswami for this  . COPD (chronic obstructive pulmonary disease)     uses Albuterol and Spiriva daily  . Shortness of breath     with exertion   . Bronchitis   . Productive cough     white in color but no odor  . Sleep apnea     uses BiPaP  . Peripheral neuropathy   . Lung mass     right upper lobe  . Back pain     4 deteriorating disc and receives an injection q3-47mon;has been doing this for about 49yrs  . H/O hiatal hernia   . GERD (gastroesophageal reflux disease)     takes Prilosec daily  . Blood transfusion     as a child  . Glaucoma(365)     hx of  . Depression     takes Prozac daily  . Insomnia     takes Trazodone nightly  . Anginal pain   . Chronic kidney disease     acute kidney failure post surgery  . Pneumonia     hx of' 61 yo,rd' last time in 2012  . Type II diabetes mellitus     takes Metformin bid and Novolog and Lantus daily  . Arthritis     "hands"  Past Surgical History  Procedure Date  . Carpal tunnel release     bilateral  . Rotator cuff repair     left  . Colonoscopy   . Esophagogastroduodenoscopy   . Eye surgery   . Lobectomy 03/17/2012    upper right side with resection  . Elbow surgery     ulnar nerve; left  . Cardiac catheterization 2006/2008/2009  . Coronary angioplasty with stent placement     1 stent  . Refractive surgery     bilaterally  . Inguinal hernia repair 1990's    double inguinal    Family History  Problem Relation Age of Onset  . COPD Mother   . Heart attack Father   . Anesthesia problems Neg Hx   . Hypotension Neg Hx   . Malignant hyperthermia Neg Hx   . Pseudochol deficiency Neg Hx     History  Substance Use Topics  . Smoking status: Current Some Day Smoker -- 1.0 packs/day for 45 years    Types:  Cigarettes    Last Attempt to Quit: 03/26/2012  . Smokeless tobacco: Never Used     Comment: reports still smoking 3 cigs a day 05-01-12  . Alcohol Use: Yes     Comment: drinking a gallon a day-smokes 2 electronic cigs a week      Review of Systems Gen: General malaise, no weight loss, fevers, chills, night sweats Eyes: no discharge or drainage, no occular pain or visual changes Nose: no epistaxis or rhinorrhea Mouth: no dental pain, no sore throat Neck: no neck pain Lungs: no SOB, cough, wheezing, respiratory status at baseline, per the patient. CV: no chest pain, palpitations, dependent edema or orthopnea Abd: As per history of present illness, otherwise negative GU: no dysuria or gross hematuria MSK: no myalgias or arthralgias Neuro: no headache, no focal neurologic deficits Skin: no rash Psyche: negative.  Allergies  Dilaudid  Home Medications   Current Outpatient Rx  Name  Route  Sig  Dispense  Refill  . ACETAMINOPHEN 500 MG PO TABS   Oral   Take 1,000 mg by mouth every 6 (six) hours as needed. For pain         . ALBUTEROL SULFATE (2.5 MG/3ML) 0.083% IN NEBU   Nebulization   Take 3 mLs (2.5 mg total) by nebulization every 4 (four) hours as needed. For shortness of breath   75 mL   10   . ASPIRIN EC 81 MG PO TBEC   Oral   Take 81 mg by mouth daily.         . ATORVASTATIN CALCIUM 20 MG PO TABS   Oral   Take 20 mg by mouth daily.         . BECLOMETHASONE DIPROPIONATE 80 MCG/ACT IN AERS   Inhalation   Inhale 2 puffs into the lungs 2 (two) times daily as needed. For shortness of breath         . CALCIUM CARBONATE 1250 MG PO TABS   Oral   Take 1 tablet by mouth daily.         Marland Kitchen DILTIAZEM HCL ER COATED BEADS 120 MG PO CP24   Oral   Take 120 mg by mouth daily.         Marland Kitchen FLUOXETINE HCL 40 MG PO CAPS   Oral   Take 40 mg by mouth daily.           Marland Kitchen GABAPENTIN 300 MG PO CAPS   Oral   Take  300 mg by mouth 3 (three) times daily.         .  INSULIN ASPART 100 UNIT/ML Bertie SOLN   Subcutaneous   Inject 10-25 Units into the skin 3 (three) times daily before meals. Home sliding scale; >140 = 10 units up to 25 units         . INSULIN GLARGINE 100 UNIT/ML [place] SOLN   Subcutaneous   Inject 80 Units into the skin every morning.          Marland Kitchen LEVALBUTEROL HCL 0.63 MG/3ML IN NEBU   Nebulization   Take 1 ampule by nebulization every 4 (four) hours as needed. For shortness of breath         . LEVOFLOXACIN 500 MG PO TABS   Oral   Take 500 mg by mouth daily.         Marland Kitchen MAGNESIUM 250 MG PO TABS   Oral   Take 500 mg by mouth 4 (four) times daily.         . ADULT MULTIVITAMIN W/MINERALS CH   Oral   Take 1 tablet by mouth daily.         . OXYCODONE HCL 5 MG PO TABS   Oral   Take 5 mg by mouth every 8 (eight) hours as needed. For pain         . PANTOPRAZOLE SODIUM 40 MG PO TBEC   Oral   Take 40 mg by mouth daily.         Marland Kitchen ALKA-SELTZER PLUS COLD & FLU PO   Oral   Take 1 packet by mouth daily as needed. For cold/flu symptoms (cough, congestion, etc.)         . PREDNISONE 5 MG PO TABS   Oral   Take 5-30 mg by mouth daily. Take 6 tabs for 4 days, 5 tabs for 4 days, 4 tabs for 4 days, 3 tabs for 4 days, 2 tabs for 4 days, and then 1 tab for 4 days and stop         . VITAMIN B-1 100 MG PO TABS   Oral   Take 100 mg by mouth daily.         Marland Kitchen TIOTROPIUM BROMIDE MONOHYDRATE 18 MCG IN CAPS   Inhalation   Place 18 mcg into inhaler and inhale daily.           . TRAZODONE HCL 100 MG PO TABS   Oral   Take 100 mg by mouth at bedtime.             BP 119/54  Pulse 78  Temp 97.8 F (36.6 C) (Oral)  Resp 18  SpO2 95%  Physical Exam Gen: well developed and well nourished appearing, uncomfortable appearing Head: NCAT Eyes: PERL, EOMI conjunctiva mildly injected bilaterally. Nose: no epistaixis or rhinorrhea Mouth/throat: mucosa is moist and pink Neck: supple, no stridor Lungs: CTA B, no wheezing, rhonchi or  rales, BS diminished both bases CV: RRR, pulse approx 100 bpm, no  Abd: obese but soft, normal BS, ttp over the LLQ without peritoneal signs, no hernias appreciated.  GU: wnl Back: no ttp, no cva ttp Skin: no rashese, wnl Neuro: CN ii-xii grossly intact, no focal deficits Psyche; flat affect,  calm and cooperative.   ED Course  Procedures (including critical care time)  Results for orders placed during the hospital encounter of 09/26/12 (from the past 24 hour(s))  CBC WITH DIFFERENTIAL     Status: Abnormal   Collection  Time   09/26/12  2:15 AM      Component Value Range   WBC 21.7 (*) 4.0 - 10.5 K/uL   RBC 4.63  4.22 - 5.81 MIL/uL   Hemoglobin 13.5  13.0 - 17.0 g/dL   HCT 96.0  45.4 - 09.8 %   MCV 84.9  78.0 - 100.0 fL   MCH 29.2  26.0 - 34.0 pg   MCHC 34.4  30.0 - 36.0 g/dL   RDW 11.9  14.7 - 82.9 %   Platelets 378  150 - 400 K/uL   Neutrophils Relative 90 (*) 43 - 77 %   Neutro Abs 19.4 (*) 1.7 - 7.7 K/uL   Lymphocytes Relative 7 (*) 12 - 46 %   Lymphs Abs 1.5  0.7 - 4.0 K/uL   Monocytes Relative 4  3 - 12 %   Monocytes Absolute 0.8  0.1 - 1.0 K/uL   Eosinophils Relative 0  0 - 5 %   Eosinophils Absolute 0.0  0.0 - 0.7 K/uL   Basophils Relative 0  0 - 1 %   Basophils Absolute 0.0  0.0 - 0.1 K/uL  COMPREHENSIVE METABOLIC PANEL     Status: Abnormal   Collection Time   09/26/12  2:15 AM      Component Value Range   Sodium 131 (*) 135 - 145 mEq/L   Potassium 4.3  3.5 - 5.1 mEq/L   Chloride 95 (*) 96 - 112 mEq/L   CO2 23  19 - 32 mEq/L   Glucose, Bld 305 (*) 70 - 99 mg/dL   BUN 42 (*) 6 - 23 mg/dL   Creatinine, Ser 5.62 (*) 0.50 - 1.35 mg/dL   Calcium 9.2  8.4 - 13.0 mg/dL   Total Protein 6.6  6.0 - 8.3 g/dL   Albumin 2.9 (*) 3.5 - 5.2 g/dL   AST 10  0 - 37 U/L   ALT 32  0 - 53 U/L   Alkaline Phosphatase 69  39 - 117 U/L   Total Bilirubin 0.3  0.3 - 1.2 mg/dL   GFR calc non Af Amer 54 (*) >90 mL/min   GFR calc Af Amer 63 (*) >90 mL/min  TYPE AND SCREEN     Status:  Normal   Collection Time   09/26/12  2:20 AM      Component Value Range   ABO/RH(D) O POS     Antibody Screen NEG     Sample Expiration 09/29/2012      EKG: nsr, no acute ischemic changes, normal intervals, normal axis, normal qrs complex, unchanged from EKG earlier this month.   1. Abdominal pain   2. Diverticulitis   3. Acute kidney failure   4. Hyperglycemia   5. Hematochezia       MDM  Patient with acute diverticulitis based on clinical exam and findings. Also noted to be volume depleted and dehydrated with acute kidney injury. We are resuscitating with IVF and treating symptomatically with MS, Pepcid, Zofran.  Treating empirically for diverticulitis with Cipro and Flagyl.  CT abd/pelvis with oral contrast is pending.  Case discussed with Dr. Eilene Ghazi who has accepted the patient for admission.   CRITICAL CARE Performed by: Brandt Loosen   Total critical care time: 40  Critical care time was exclusive of separately billable procedures and treating other patients.  Critical care was necessary to treat or prevent imminent or life-threatening deterioration.  Critical care was time spent personally by me on the following activities: development of  treatment plan with patient and/or surrogate as well as nursing, discussions with consultants, evaluation of patient's response to treatment, examination of patient, obtaining history from patient or surrogate, ordering and performing treatments and interventions, ordering and review of laboratory studies, ordering and review of radiographic studies, pulse oximetry and re-evaluation of patient's condition.         Brandt Loosen, MD 09/26/12 [phone]

## 2012-09-26 NOTE — Progress Notes (Signed)
Placed pt. On cpap per order. Pt. Is tolerating auto titrate mode well at this time. RT to monitor.

## 2012-09-26 NOTE — Progress Notes (Signed)
VASCULAR SURGERY FOLLOW UP  CT angiogram shows no evidence of acute mesenteric ischemia. The celiac axis, superior mesenteric artery, inferior mesenteric artery, and both hypogastric arteries are patent. The etiology of his abdominal pain is not clear but it does not appear to be acute mesenteric ischemia. Nor did he give any history consistent with chronic mesenteric ischemia.  Cari Caraway Beeper [phone] 09/26/2012

## 2012-09-26 NOTE — ED Notes (Signed)
Pt was discharged from [facility] afternoon for resp failure r/t acute bronchitis.  Wed night he began having abd pain.  Today he began noticing blood "running like water" when he had a bowel movement.  Clear emesis today.

## 2012-09-26 NOTE — Consult Note (Signed)
[facility]  Patient name: [name] MRN: [id number] DOB: 02-Nov-1951 Sex: male  REASON FOR CONSULT: possible ischemic intestine  HPI: [name] is a 61 y.o. male who was in the hospital from 09/21/2012 until 09/24/2012 with acute respiratory failure. He was discharged on Wednesday. On Wednesday night he states that he developed acute onset of lower abdominal pain. The pain was constant and severe. He has also had some bloody stool. He has had nausea and vomiting. The pain has been persistent. He was admitted today with abdominal pain. CT scan was obtained which showed evidence of possible ischemic bowel and vascular surgery was consulted.  He denies any history of postprandial abdominal pain. He denies any history of weight loss. He states that he has had no previous abdominal surgery. He has no history of atrial fibrillation or arrhythmias at that he is aware of. His pain is persistent and located in the lower abdomen. There are really no aggravating or alleviating factors.  Past Medical History  Diagnosis Date  . Anemia   . Allergy   . Thyroid disease   . Parathyroid disease   . Headache   . Hypertension     takes  HYzaar daily  . Coronary artery disease     has 1 stent  . Myocardial infarct   . Asthma   . Emphysema     sees Dr.Ramaswami for this  . COPD (chronic obstructive pulmonary disease)     uses Albuterol and Spiriva daily  . Shortness of breath     with exertion   . Bronchitis   . Productive cough     white in color but no odor  . Sleep apnea     uses BiPaP  . Peripheral neuropathy   . Lung mass     right upper lobe  . Back pain     4 deteriorating disc and receives an injection q3-42mon;has been doing this for about 19yrs  . H/O hiatal hernia   . GERD (gastroesophageal reflux disease)     takes Prilosec daily  . Blood transfusion     as a child  . Glaucoma(365)     hx of  . Depression     takes Prozac daily  .  Insomnia     takes Trazodone nightly  . Anginal pain   . Chronic kidney disease     acute kidney failure post surgery  . Pneumonia     hx of' 61 yo,rd' last time in 2012  . Type II diabetes mellitus     takes Metformin bid and Novolog and Lantus daily  . Arthritis     "hands"    Family History  Problem Relation Age of Onset  . COPD Mother   . Heart attack Father   . Anesthesia problems Neg Hx   . Hypotension Neg Hx   . Malignant hyperthermia Neg Hx   . Pseudochol deficiency Neg Hx     SOCIAL HISTORY: History  Substance Use Topics  . Smoking status: Current Some Day Smoker -- 1.0 packs/day for 45 years    Types: Cigarettes    Last Attempt to Quit: 03/26/2012  . Smokeless tobacco: Never Used     Comment: reports still smoking 3 cigs a day 05-01-12  . Alcohol Use: Yes     Comment: drinking a gallon a day-smokes 2 electronic cigs a week    Allergies  Allergen Reactions  . Dilaudid (Hydromorphone Hcl)  Made him crazy    Current Facility-Administered Medications  Medication Dose Route Frequency Provider Last Rate Last Dose  . 0.9 %  sodium chloride infusion   Intravenous Continuous Eduard Clos, MD 100 mL/hr at 09/26/12 1119    . acetaminophen (TYLENOL) tablet 650 mg  650 mg Oral Q6H PRN Eduard Clos, MD       Or  . acetaminophen (TYLENOL) suppository 650 mg  650 mg Rectal Q6H PRN Eduard Clos, MD      . ipratropium (ATROVENT) nebulizer solution 0.5 mg  0.5 mg Nebulization Q4H Renae Fickle, MD       And  . albuterol (PROVENTIL) (5 MG/ML) 0.5% nebulizer solution 2.5 mg  2.5 mg Nebulization Q4H Renae Fickle, MD      . albuterol (PROVENTIL) (5 MG/ML) 0.5% nebulizer solution 2.5 mg  2.5 mg Nebulization Q2H PRN Renae Fickle, MD      . budesonide (PULMICORT) nebulizer solution 0.5 mg  0.5 mg Nebulization BID Renae Fickle, MD      . [address] ciprofloxacin (CIPRO) IVPB 200 mg  200 mg Intravenous Once Brandt Loosen, MD   200 mg at 09/26/12 0547    . ciprofloxacin (CIPRO) IVPB 400 mg  400 mg Intravenous Q12H Hilario Quarry Amend, PHARMD      . diltiazem (CARDIZEM CD) 24 hr capsule 120 mg  120 mg Oral Daily Eduard Clos, MD   120 mg at 09/26/12 1253  . [COMPLETED] famotidine (PEPCID) IVPB 20 mg  20 mg Intravenous Once Brandt Loosen, MD   20 mg at 09/26/12 0522  . insulin aspart (novoLOG) injection 0-9 Units  0-9 Units Subcutaneous Q4H Renae Fickle, MD   2 Units at 09/26/12 1254  . [COMPLETED] insulin aspart (novoLOG) injection 5 Units  5 Units Subcutaneous Once Brandt Loosen, MD   5 Units at 09/26/12 0418  . insulin glargine (LANTUS) injection 80 Units  80 Units Subcutaneous q morning - 10a Eduard Clos, MD   80 Units at 09/26/12 1254  . magnesium gluconate (MAGONATE) tablet 500 mg  500 mg Oral TID PC & HS Renae Fickle, MD   500 mg at 09/26/12 1336  . methylPREDNISolone sodium succinate (SOLU-MEDROL) 40 mg/mL injection 20 mg  20 mg Intravenous Daily Eduard Clos, MD   20 mg at 09/26/12 1253  . [COMPLETED] metroNIDAZOLE (FLAGYL) IVPB 500 mg  500 mg Intravenous Once Brandt Loosen, MD   500 mg at 09/26/12 0427  . metroNIDAZOLE (FLAGYL) IVPB 500 mg  500 mg Intravenous Q8H Eduard Clos, MD   500 mg at 09/26/12 1321  . morphine 2 MG/ML injection 1 mg  1 mg Intravenous Q4H PRN Eduard Clos, MD   1 mg at 09/26/12 1146  . [COMPLETED] morphine 4 MG/ML injection 4 mg  4 mg Intravenous Once Brandt Loosen, MD   4 mg at 09/26/12 0423  . [COMPLETED] ondansetron (ZOFRAN) injection 4 mg  4 mg Intravenous Once Brandt Loosen, MD   4 mg at 09/26/12 0421  . ondansetron (ZOFRAN) tablet 4 mg  4 mg Oral Q6H PRN Eduard Clos, MD       Or  . ondansetron [facility]) injection 4 mg  4 mg Intravenous Q6H PRN Eduard Clos, MD      . pantoprazole (PROTONIX) injection 40 mg  40 mg Intravenous Q1200 Eduard Clos, MD   40 mg at 09/26/12 1253  . [COMPLETED] sodium chloride 0.9 % bolus 1,000 mL  1,000 mL Intravenous Once Raynelle Fanning  Lavella Lemons, MD   1,000 mL at 09/26/12 0420  . sodium chloride 0.9 % injection 3 mL  3 mL Intravenous Q12H Eduard Clos, MD   3 mL at 09/26/12 1122  . thiamine (VITAMIN B-1) tablet 100 mg  100 mg Oral Daily Eduard Clos, MD   100 mg at 09/26/12 1253  . traZODone (DESYREL) tablet 100 mg  100 mg Oral QHS Eduard Clos, MD      . [DISCONTINUED] ciprofloxacin (CIPRO) IVPB 400 mg  400 mg Intravenous Q12H [facility] [place]      . [DISCONTINUED] insulin aspart (novoLOG) injection 0-9 Units  0-9 Units Subcutaneous TID WC Eduard Clos, MD      . [DISCONTINUED] Magnesium TABS 500 mg  500 mg Oral QID Eduard Clos, MD        REVIEW OF SYSTEMS: Arly.Keller ] denotes positive finding; [  ] denotes negative finding CARDIOVASCULAR:  [ ]  chest pain   [ ]  chest pressure   [ ]  palpitations   [ ]  orthopnea   Arly.Keller ] dyspnea on exertion   [ ]  claudication   [ ]  rest pain   [ ]  DVT   [ ]  phlebitis PULMONARY:   [ ]  productive cough   [ ]  asthma   [ ]  wheezing NEUROLOGIC:   [ ]  weakness  [ ]  paresthesias  [ ]  aphasia  [ ]  amaurosis  [ ]  dizziness HEMATOLOGIC:   [ ]  bleeding problems   [ ]  clotting disorders MUSCULOSKELETAL:  [ ]  joint pain   [ ]  joint swelling [ ]  leg swelling GASTROINTESTINAL: Arly.Keller ]  blood in stool  [ ]   hematemesis GENITOURINARY:  [ ]   dysuria  [ ]   hematuria PSYCHIATRIC:  [ ]  history of major depression INTEGUMENTARY:  [ ]  rashes  [ ]  ulcers CONSTITUTIONAL:  [ ]  fever   [ ]  chills  PHYSICAL EXAM: Filed Vitals:   09/26/12 0815 09/26/12 0830 09/26/12 0900 09/26/12 1030  BP:  132/65  151/77  Pulse: 74 75  78  Temp:    97.9 F (36.6 C)  TempSrc:      Resp: 19 20  20   Height:   5\' 10"  (1.778 m)   Weight:   287 lb 7.7 oz (130.401 kg)   SpO2: 97% 97%  96%   Body mass index is 41.25 kg/(m^2).  He is morbidly obese. GENERAL: The patient is a well-nourished male, in no acute distress. The vital signs are documented above. CARDIOVASCULAR: There is a regular rate  and rhythm. I do not detect carotid bruits. He has palpable femoral and pedal pulses bilaterally. He has no significant lower extremity swelling. PULMONARY: There is good air exchange bilaterally without wheezing or rales. ABDOMEN: His abdomen is soft and mildly tender to deep palpation. He does have bowel sounds which are normal pitched.  MUSCULOSKELETAL: There are no major deformities or cyanosis. NEUROLOGIC: No focal weakness or paresthesias are detected. SKIN: There are no ulcers or rashes noted. PSYCHIATRIC: The patient has a normal affect.  DATA:  Lab Results  Component Value Date   WBC 21.7* 09/26/2012   HGB 13.5 09/26/2012   HCT 39.3 09/26/2012   MCV 84.9 09/26/2012   PLT 378 09/26/2012   Lab Results  Component Value Date   NA 137 09/26/2012   K 4.2 09/26/2012   CL 99 09/26/2012   CO2 29 09/26/2012   Lab Results  Component Value Date   CREATININE 1.11 09/26/2012  Lab Results  Component Value Date   INR 1.00 03/13/2012   INR 0.91 11/13/2011   INR 1.0 12/20/2008   Lab Results  Component Value Date   HGBA1C 7.7* 04/03/2012   CBG (last 3)   Basename 09/26/12 1116 09/24/12 1307 09/24/12 0758  GLUCAP 195* 280* 276*   CT OF ABDOMEN: Shows small bowel loops in the right lower quadrant with circumferential bowel wall thickening and edema of the adjacent mesentery. This could be consistent with infection or inflammatory enteritis or possible ischemia. The CT scan was done without IV contrast.  MEDICAL ISSUES: This patient presents with the acute onset of abdominal pain which occurred 2 nights ago. His pain has persisted. He denies previous abdominal surgery. Given the sudden onset of abdominal pain with pain out of proportion to the exam certainly he can have acute mesenteric ischemia. He has no history of atrial fibrillation or arrhythmias. He did not give any history of postprandial abdominal pain. However certainly the history could be consistent with acute mesenteric  ischemia. I have ordered a CT scan of the abdomen and pelvis with IV contrast to further evaluate for mesenteric ischemia. In addition I have consult of the general surgeons. We will make further recommendations pending the results of his general surgery evaluation and CT scan of the abdomen and pelvis with IV contrast. Certainly he will be at increased risk for surgery if she requires surgery given his pulmonary status and obesity. I discussed the situation with the patient and his wife.  DICKSON,CHRISTOPHER S [facility] [place] Beeper: [phone]

## 2012-09-26 NOTE — Progress Notes (Signed)
Utilization review completed.  

## 2012-09-26 NOTE — Progress Notes (Addendum)
CT scan demonstrated small bowel edema and inflammation versus ischemia (no contrast used due to AKI).  Spoke with patient who states he is followed by Dr. Ewing Schlein at [place] for colonoscopy.    -  Awaiting stool culture and C. Diff -  Continue IV antibiotics -  GI consultation  -  Vascular surgery consultation -  NPO  -  Continue IVF -  Lactic acid still not drawn  SOB and diminished BS -  Duonebs and albuterol prn -  Budesonide  -  Patient on low dose IV steroid.  Consider increasing if no improvement with nebs

## 2012-09-26 NOTE — Consult Note (Signed)
[name] 1951-01-08  [id number].   Primary Care MD: Dr. Malachi Bonds of Internal Medicine Requesting MD: Dr. Edilia Bo of Vascular Surgery  Chief Complaint/Reason for Consult: Abdominal pain  HPI: 61yo M discharged from the hospital on 09/24/12 after admission for respiratory issues treated at home with prednisone , albuterol, and levofloxacin. Abdominal pain started that same night and is constant, sharp, and ache in nature. No alleviating or aggrevating factors. Associated w/ n/v w/ food. Poor po due to n/v since Wed. Multiple loose daily BM during this time. One syncopal episode yesterday after episode of diarrhea and standing up quickly from the toilet. Denies fever, HA, rash, CP, palpitations. SOB still worse than pts baseline as he has not fully recovered from his previous respiratory illness.  Abdominal history is significant for diverticulitis about 25 years ago and bilateral inguinal hernia repair several years ago.   ROS: As per the HPI. All other systems negative.   Family History  Problem Relation Age of Onset  . COPD Mother   . Heart attack Father   . Anesthesia problems Neg Hx   . Hypotension Neg Hx   . Malignant hyperthermia Neg Hx   . Pseudochol deficiency Neg Hx     Past Medical History  Diagnosis Date  . Anemia   . Allergy   . Thyroid disease   . Parathyroid disease   . Headache   . Hypertension     takes  HYzaar daily  . Coronary artery disease     has 1 stent  . Myocardial infarct   . Asthma   . Emphysema     sees Dr.Ramaswami for this  . COPD (chronic obstructive pulmonary disease)     uses Albuterol and Spiriva daily  . Shortness of breath     with exertion   . Bronchitis   . Productive cough     white in color but no odor  . Sleep apnea     uses BiPaP  . Peripheral neuropathy   . Lung mass     right upper lobe  . Back pain     4 deteriorating disc and receives an injection q3-15mon;has been doing this for about 61yrs  . H/O hiatal hernia   .  GERD (gastroesophageal reflux disease)     takes Prilosec daily  . Blood transfusion     as a child  . Glaucoma(365)     hx of  . Depression     takes Prozac daily  . Insomnia     takes Trazodone nightly  . Anginal pain   . Chronic kidney disease     acute kidney failure post surgery  . Pneumonia     hx of' 61 yo,rd' last time in 2012  . Type II diabetes mellitus     takes Metformin bid and Novolog and Lantus daily  . Arthritis     "hands"    Past Surgical History  Procedure Date  . Carpal tunnel release     bilateral  . Rotator cuff repair     left  . Colonoscopy   . Esophagogastroduodenoscopy   . Eye surgery   . Lobectomy 03/17/2012    upper right side with resection  . Elbow surgery     ulnar nerve; left  . Cardiac catheterization 2006/2008/2009  . Coronary angioplasty with stent placement     1 stent  . Refractive surgery     bilaterally  . Inguinal hernia repair 1990's    double inguinal  Social History:  reports that he has been smoking Cigarettes.  He has a 45 pack-year smoking history. He has never used smokeless tobacco. He reports that he drinks alcohol. He reports that he uses illicit drugs (Cocaine).  Allergies:  Allergies  Allergen Reactions  . Dilaudid (Hydromorphone Hcl)     Made him crazy    Medications Prior to Admission  Medication Sig Dispense Refill  . acetaminophen (TYLENOL) 500 MG tablet Take 1,000 mg by mouth every 6 (six) hours as needed. For pain      . albuterol (PROVENTIL) (2.5 MG/3ML) 0.083% nebulizer solution Take 3 mLs (2.5 mg total) by nebulization every 4 (four) hours as needed. For shortness of breath  75 mL  10  . aspirin EC 81 MG tablet Take 81 mg by mouth daily.      Marland Kitchen atorvastatin (LIPITOR) 20 MG tablet Take 20 mg by mouth daily.      . beclomethasone (QVAR) 80 MCG/ACT inhaler Inhale 2 puffs into the lungs 2 (two) times daily as needed. For shortness of breath      . calcium carbonate (OS-CAL - DOSED IN MG OF ELEMENTAL  CALCIUM) 1250 MG tablet Take 1 tablet by mouth daily.      Marland Kitchen diltiazem (CARDIZEM CD) 120 MG 24 hr capsule Take 120 mg by mouth daily.      Marland Kitchen FLUoxetine (PROZAC) 40 MG capsule Take 40 mg by mouth daily.        Marland Kitchen gabapentin (NEURONTIN) 300 MG capsule Take 300 mg by mouth 3 (three) times daily.      . insulin aspart (NOVOLOG) 100 UNIT/ML injection Inject 10-25 Units into the skin 3 (three) times daily before meals. Home sliding scale; >140 = 10 units up to 25 units      . insulin glargine (LANTUS) 100 UNIT/ML injection Inject 80 Units into the skin every morning.       . levalbuterol (XOPENEX) 0.63 MG/3ML nebulizer solution Take 1 ampule by nebulization every 4 (four) hours as needed. For shortness of breath      . levofloxacin (LEVAQUIN) 500 MG tablet Take 500 mg by mouth daily.      . Magnesium 250 MG TABS Take 500 mg by mouth 4 (four) times daily.      . Multiple Vitamin (MULTIVITAMIN WITH MINERALS) TABS Take 1 tablet by mouth daily.      Marland Kitchen oxyCODONE (OXY IR/ROXICODONE) 5 MG immediate release tablet Take 5 mg by mouth every 8 (eight) hours as needed. For pain      . pantoprazole (PROTONIX) 40 MG tablet Take 40 mg by mouth daily.      Marland Kitchen Phenyleph-CPM-DM-APAP (ALKA-SELTZER PLUS COLD & FLU PO) Take 1 packet by mouth daily as needed. For cold/flu symptoms (cough, congestion, etc.)      . predniSONE (DELTASONE) 5 MG tablet Take 5-30 mg by mouth daily. Take 6 tabs for 4 days, 5 tabs for 4 days, 4 tabs for 4 days, 3 tabs for 4 days, 2 tabs for 4 days, and then 1 tab for 4 days and stop      . thiamine (VITAMIN B-1) 100 MG tablet Take 100 mg by mouth daily.      Marland Kitchen tiotropium (SPIRIVA) 18 MCG inhalation capsule Place 18 mcg into inhaler and inhale daily.        . traZODone (DESYREL) 100 MG tablet Take 100 mg by mouth at bedtime.          Blood pressure 151/77, pulse 78, temperature  97.9 F (36.6 C), temperature source Oral, resp. rate 20, height 5\' 10"  (1.778 m), weight 287 lb 7.7 oz (130.401 kg), SpO2  96.00%. Physical Exam:  Gen: mild distress, obese,  HEENT: MMM, EOMI, No nuchal rigidity CV, RRR, no m/r/g, normal S1/S2 Res: diffuse wheezing bilat. On 2L [place]. Mild increased WOB Abd: abdominal tenderness to palpation primarily in the umbilical region, negative Murphy's sign, no rebound, soft, non-distended, BS present LE: ne edema Musc: normal ROM, no effusions Skin: No rash, warm and intact, no masses Neuro: CN grossly intact, cerebellar function intact Psych: Affect normal, pleasant     Results for orders placed during the hospital encounter of 09/26/12 (from the past 48 hour(s))  CBC WITH DIFFERENTIAL     Status: Abnormal   Collection Time   09/26/12  2:15 AM      Component Value Range Comment   WBC 21.7 (*) 4.0 - 10.5 K/uL    RBC 4.63  4.22 - 5.81 MIL/uL    Hemoglobin 13.5  13.0 - 17.0 g/dL    HCT 47.8  29.5 - 62.1 %    MCV 84.9  78.0 - 100.0 fL    MCH 29.2  26.0 - 34.0 pg    MCHC 34.4  30.0 - 36.0 g/dL    RDW 30.8  65.7 - 84.6 %    Platelets 378  150 - 400 K/uL    Neutrophils Relative 90 (*) 43 - 77 %    Neutro Abs 19.4 (*) 1.7 - 7.7 K/uL    Lymphocytes Relative 7 (*) 12 - 46 %    Lymphs Abs 1.5  0.7 - 4.0 K/uL    Monocytes Relative 4  3 - 12 %    Monocytes Absolute 0.8  0.1 - 1.0 K/uL    Eosinophils Relative 0  0 - 5 %    Eosinophils Absolute 0.0  0.0 - 0.7 K/uL    Basophils Relative 0  0 - 1 %    Basophils Absolute 0.0  0.0 - 0.1 K/uL   COMPREHENSIVE METABOLIC PANEL     Status: Abnormal   Collection Time   09/26/12  2:15 AM      Component Value Range Comment   Sodium 131 (*) 135 - 145 mEq/L    Potassium 4.3  3.5 - 5.1 mEq/L    Chloride 95 (*) 96 - 112 mEq/L    CO2 23  19 - 32 mEq/L    Glucose, Bld 305 (*) 70 - 99 mg/dL    BUN 42 (*) 6 - 23 mg/dL DELTA CHECK NOTED   Creatinine, Ser 1.36 (*) 0.50 - 1.35 mg/dL DELTA CHECK NOTED   Calcium 9.2  8.4 - 10.5 mg/dL    Total Protein 6.6  6.0 - 8.3 g/dL    Albumin 2.9 (*) 3.5 - 5.2 g/dL    AST 10  0 - 37 U/L    ALT  32  0 - 53 U/L    Alkaline Phosphatase 69  39 - 117 U/L    Total Bilirubin 0.3  0.3 - 1.2 mg/dL    GFR calc non Af Amer 54 (*) >90 mL/min    GFR calc Af Amer 63 (*) >90 mL/min   TYPE AND SCREEN     Status: Normal   Collection Time   09/26/12  2:20 AM      Component Value Range Comment   ABO/RH(D) O POS      Antibody Screen NEG      Sample Expiration  09/29/2012     GLUCOSE, CAPILLARY     Status: Abnormal   Collection Time   09/26/12 11:16 AM      Component Value Range Comment   Glucose-Capillary 195 (*) 70 - 99 mg/dL   COMPREHENSIVE METABOLIC PANEL     Status: Abnormal   Collection Time   09/26/12 12:05 PM      Component Value Range Comment   Sodium 137  135 - 145 mEq/L    Potassium 4.2  3.5 - 5.1 mEq/L    Chloride 99  96 - 112 mEq/L    CO2 29  19 - 32 mEq/L    Glucose, Bld 183 (*) 70 - 99 mg/dL    BUN 35 (*) 6 - 23 mg/dL    Creatinine, Ser 1.61  0.50 - 1.35 mg/dL    Calcium 9.1  8.4 - 09.6 mg/dL    Total Protein 6.3  6.0 - 8.3 g/dL    Albumin 2.8 (*) 3.5 - 5.2 g/dL    AST 12  0 - 37 U/L    ALT 27  0 - 53 U/L    Alkaline Phosphatase 73  39 - 117 U/L    Total Bilirubin 0.2 (*) 0.3 - 1.2 mg/dL    GFR calc non Af Amer 69 (*) >90 mL/min    GFR calc Af Amer 80 (*) >90 mL/min   LACTIC ACID, PLASMA     Status: Normal   Collection Time   09/26/12 12:11 PM      Component Value Range Comment   Lactic Acid, Venous 1.3  0.5 - 2.2 mmol/L    Ct Abdomen Pelvis Wo Contrast  09/26/2012  *RADIOLOGY REPORT*  Clinical Data: Left lower quadrant abdominal pain with nausea vomiting.  Bloody stools.  CT ABDOMEN AND PELVIS WITHOUT CONTRAST  Technique:  Multidetector CT imaging of the abdomen and pelvis was performed following the standard protocol without intravenous contrast.  Comparison: 11/13/2011  Findings: Patchy airspace disease and both lower lobes has a nodular component.  Imaging features is suggestive of bibasilar bronchopneumonia.  No focal abnormalities seen in the liver or spleen on  this study performed without intravenous contrast material.  Stomach, duodenum, pancreas, and adrenal glands are unremarkable.  There may be some minimal layering sludge in the lumen of the gallbladder.  No abdominal aortic aneurysm.  Water density lesions are identified in both kidneys, compatible with cysts.  There is a small amount of free fluid around the liver and spleen.  No abdominal lymphadenopathy.  No bowel obstruction.  Imaging through the pelvis shows a small to moderate volume of free fluid.  No pelvic sidewall lymphadenopathy.  There are some small bowel loops in the right lower quadrant which show circumferential wall thickening and ill definition.  The mesentery assist with these bowel loops is congested and edematous.  No evidence for pneumatosis.  No features to suggest an overt bowel obstruction at this time.  No evidence for colonic diverticulitis.  The terminal ileum is normal.  The appendix is normal.  Bone windows reveal no worrisome lytic or sclerotic osseous lesions.  IMPRESSION: Small bowel loops in the right lower quadrant demonstrates circumferential bowel wall thickening with vascular congestion and edema within the adjacent mesentery.  Imaging features could be compatible with focal infectious or inflammatory enteritis. Ischemia would also be a consideration but without intravenous contrast, vascular patency and bowel wall enhancement cannot be assessed.   Original Report Authenticated By: Kennith Center, M.D.  Assessment/Plan 61yo M w/ h/o COPD, OSA, DM, HTN, Depression, Anemia, Diverticulitis presenting w/ 2 day h/o abdominal pain, n/v. Syncopal episode from yesterday likely vasovagal.   Abd pain: ischemic colitis vs infectious colitis vs diverticulitis;  unlikely cholecystitis,  Appendicitis, pancreatitis.  - Agree w/ contrast CT to better determine ischemia - NPO - Continue w/ current ABX therapy given Abd pain and elevated WBC - IVF per primary team  Other medical  conditions per internal medicine  MERRELL, DAVID Resident PGY-2 09/26/2012, 3:11 PM Pager: [phone]         ROS Physical Exam

## 2012-09-26 NOTE — Consult Note (Signed)
Reason for Consult: Abdominal pain abnormal CAT scan Referring Physician: Hospital team  [name] is an 61 y.o. male.  HPI: Patient familiar to me from a routine colonoscopy last year who was recently admitted with pneumonia and on the day of discharge began having abdominal pain which increased over the next day and he did have some blood in his bowels all bright red and some nausea without vomiting and presented back to the emergency room and had increased white count and increased BUN and creatinine and a noncontrast CT imply some small bowel edema and I was called by the hospital team is suggested over the phone that they get a surgical consultation or proceed with an angiogram and the vascular surgeon saw the patient and they've elected to proceed with a CT with contrast. He has not had any GI symptoms or signs of bleeding prior to the above and has not had any blood clots before and no GI problems run in his family and his only new medicines were low dose prednisone and antibiotics Past Medical History  Diagnosis Date  . Anemia   . Allergy   . Thyroid disease   . Parathyroid disease   . Headache   . Hypertension     takes  HYzaar daily  . Coronary artery disease     has 1 stent  . Myocardial infarct   . Asthma   . Emphysema     sees Dr.Ramaswami for this  . COPD (chronic obstructive pulmonary disease)     uses Albuterol and Spiriva daily  . Shortness of breath     with exertion   . Bronchitis   . Productive cough     white in color but no odor  . Sleep apnea     uses BiPaP  . Peripheral neuropathy   . Lung mass     right upper lobe  . Back pain     4 deteriorating disc and receives an injection q3-79mon;has been doing this for about 28yrs  . H/O hiatal hernia   . GERD (gastroesophageal reflux disease)     takes Prilosec daily  . Blood transfusion     as a child  . Glaucoma(365)     hx of  . Depression     takes Prozac daily  . Insomnia     takes Trazodone  nightly  . Anginal pain   . Chronic kidney disease     acute kidney failure post surgery  . Pneumonia     hx of' 61 yo,rd' last time in 2012  . Type II diabetes mellitus     takes Metformin bid and Novolog and Lantus daily  . Arthritis     "hands"    Past Surgical History  Procedure Date  . Carpal tunnel release     bilateral  . Rotator cuff repair     left  . Colonoscopy   . Esophagogastroduodenoscopy   . Eye surgery   . Lobectomy 03/17/2012    upper right side with resection  . Elbow surgery     ulnar nerve; left  . Cardiac catheterization 2006/2008/2009  . Coronary angioplasty with stent placement     1 stent  . Refractive surgery     bilaterally  . Inguinal hernia repair 1990's    double inguinal    Family History  Problem Relation Age of Onset  . COPD Mother   . Heart attack Father   . Anesthesia problems Neg Hx   .  Hypotension Neg Hx   . Malignant hyperthermia Neg Hx   . Pseudochol deficiency Neg Hx     Social History:  reports that he has been smoking Cigarettes.  He has a 45 pack-year smoking history. He has never used smokeless tobacco. He reports that he drinks alcohol. He reports that he uses illicit drugs (Cocaine).  Allergies:  Allergies  Allergen Reactions  . Dilaudid (Hydromorphone Hcl)     Made him crazy    Medications: I have reviewed the patient's current medications.  Results for orders placed during the hospital encounter of 09/26/12 (from the past 48 hour(s))  CBC WITH DIFFERENTIAL     Status: Abnormal   Collection Time   09/26/12  2:15 AM      Component Value Range Comment   WBC 21.7 (*) 4.0 - 10.5 K/uL    RBC 4.63  4.22 - 5.81 MIL/uL    Hemoglobin 13.5  13.0 - 17.0 g/dL    HCT 30.8  65.7 - 84.6 %    MCV 84.9  78.0 - 100.0 fL    MCH 29.2  26.0 - 34.0 pg    MCHC 34.4  30.0 - 36.0 g/dL    RDW 96.2  95.2 - 84.1 %    Platelets 378  150 - 400 K/uL    Neutrophils Relative 90 (*) 43 - 77 %    Neutro Abs 19.4 (*) 1.7 - 7.7 K/uL     Lymphocytes Relative 7 (*) 12 - 46 %    Lymphs Abs 1.5  0.7 - 4.0 K/uL    Monocytes Relative 4  3 - 12 %    Monocytes Absolute 0.8  0.1 - 1.0 K/uL    Eosinophils Relative 0  0 - 5 %    Eosinophils Absolute 0.0  0.0 - 0.7 K/uL    Basophils Relative 0  0 - 1 %    Basophils Absolute 0.0  0.0 - 0.1 K/uL   COMPREHENSIVE METABOLIC PANEL     Status: Abnormal   Collection Time   09/26/12  2:15 AM      Component Value Range Comment   Sodium 131 (*) 135 - 145 mEq/L    Potassium 4.3  3.5 - 5.1 mEq/L    Chloride 95 (*) 96 - 112 mEq/L    CO2 23  19 - 32 mEq/L    Glucose, Bld 305 (*) 70 - 99 mg/dL    BUN 42 (*) 6 - 23 mg/dL DELTA CHECK NOTED   Creatinine, Ser 1.36 (*) 0.50 - 1.35 mg/dL DELTA CHECK NOTED   Calcium 9.2  8.4 - 10.5 mg/dL    Total Protein 6.6  6.0 - 8.3 g/dL    Albumin 2.9 (*) 3.5 - 5.2 g/dL    AST 10  0 - 37 U/L    ALT 32  0 - 53 U/L    Alkaline Phosphatase 69  39 - 117 U/L    Total Bilirubin 0.3  0.3 - 1.2 mg/dL    GFR calc non Af Amer 54 (*) >90 mL/min    GFR calc Af Amer 63 (*) >90 mL/min   TYPE AND SCREEN     Status: Normal   Collection Time   09/26/12  2:20 AM      Component Value Range Comment   ABO/RH(D) O POS      Antibody Screen NEG      Sample Expiration 09/29/2012     GLUCOSE, CAPILLARY     Status: Abnormal  Collection Time   09/26/12 11:16 AM      Component Value Range Comment   Glucose-Capillary 195 (*) 70 - 99 mg/dL   COMPREHENSIVE METABOLIC PANEL     Status: Abnormal   Collection Time   09/26/12 12:05 PM      Component Value Range Comment   Sodium 137  135 - 145 mEq/L    Potassium 4.2  3.5 - 5.1 mEq/L    Chloride 99  96 - 112 mEq/L    CO2 29  19 - 32 mEq/L    Glucose, Bld 183 (*) 70 - 99 mg/dL    BUN 35 (*) 6 - 23 mg/dL    Creatinine, Ser 8.65  0.50 - 1.35 mg/dL    Calcium 9.1  8.4 - 78.4 mg/dL    Total Protein 6.3  6.0 - 8.3 g/dL    Albumin 2.8 (*) 3.5 - 5.2 g/dL    AST 12  0 - 37 U/L    ALT 27  0 - 53 U/L    Alkaline Phosphatase 73  39 - 117  U/L    Total Bilirubin 0.2 (*) 0.3 - 1.2 mg/dL    GFR calc non Af Amer 69 (*) >90 mL/min    GFR calc Af Amer 80 (*) >90 mL/min   LACTIC ACID, PLASMA     Status: Normal   Collection Time   09/26/12 12:11 PM      Component Value Range Comment   Lactic Acid, Venous 1.3  0.5 - 2.2 mmol/L     Ct Abdomen Pelvis Wo Contrast  09/26/2012  *RADIOLOGY REPORT*  Clinical Data: Left lower quadrant abdominal pain with nausea vomiting.  Bloody stools.  CT ABDOMEN AND PELVIS WITHOUT CONTRAST  Technique:  Multidetector CT imaging of the abdomen and pelvis was performed following the standard protocol without intravenous contrast.  Comparison: 11/13/2011  Findings: Patchy airspace disease and both lower lobes has a nodular component.  Imaging features is suggestive of bibasilar bronchopneumonia.  No focal abnormalities seen in the liver or spleen on this study performed without intravenous contrast material.  Stomach, duodenum, pancreas, and adrenal glands are unremarkable.  There may be some minimal layering sludge in the lumen of the gallbladder.  No abdominal aortic aneurysm.  Water density lesions are identified in both kidneys, compatible with cysts.  There is a small amount of free fluid around the liver and spleen.  No abdominal lymphadenopathy.  No bowel obstruction.  Imaging through the pelvis shows a small to moderate volume of free fluid.  No pelvic sidewall lymphadenopathy.  There are some small bowel loops in the right lower quadrant which show circumferential wall thickening and ill definition.  The mesentery assist with these bowel loops is congested and edematous.  No evidence for pneumatosis.  No features to suggest an overt bowel obstruction at this time.  No evidence for colonic diverticulitis.  The terminal ileum is normal.  The appendix is normal.  Bone windows reveal no worrisome lytic or sclerotic osseous lesions.  IMPRESSION: Small bowel loops in the right lower quadrant demonstrates  circumferential bowel wall thickening with vascular congestion and edema within the adjacent mesentery.  Imaging features could be compatible with focal infectious or inflammatory enteritis. Ischemia would also be a consideration but without intravenous contrast, vascular patency and bowel wall enhancement cannot be assessed.   Original Report Authenticated By: Kennith Center, M.D.     ROS negative except above he is breathing better Blood pressure 151/77, pulse 78, temperature  97.9 F (36.6 C), temperature source Oral, resp. rate 20, height 5\' 10"  (1.778 m), weight 130.401 kg (287 lb 7.7 oz), SpO2 96.00%. Physical Exam patient sitting up in bed no acute distress looks more comfortable than I thought abdomen is a little firm without guarding or rebound with rare bowel sound and minimal tenderness throughout labs and CT reviewed and case discussed with vascular surgery and the primary team and the patient and his wife  Assessment/Plan: Questionable intestinal ischemia Plan: Await CT with contrast and possible general surgery consult as well otherwise if CT okay can try clear liquids and will ask my partner to check on him tomorrow and await followup labs  [facility] 09/26/2012, 2:55 PM

## 2012-09-26 NOTE — Progress Notes (Signed)
ANTIBIOTIC CONSULT NOTE - INITIAL  Pharmacy Consult for Cipro Indication: diverticulitis versus colitis  Allergies  Allergen Reactions  . Dilaudid (Hydromorphone Hcl)     Made him crazy    Vital Signs: Temp: 97.8 F (36.6 C) (11/22 0217) Temp src: Oral (11/22 0217) BP: 132/65 mmHg (11/22 0830) Pulse Rate: 75  (11/22 0830) Intake/Output from previous day: 11/21 0701 - 11/22 0700 In: -  Out: 350 [Urine:350] Intake/Output from this shift:    Labs:  [facility] 09/26/12 0215 09/24/12 0610  WBC 21.7* 10.9*  HGB 13.5 11.1*  PLT 378 324  LABCREA -- --  CREATININE 1.36* 0.83   The CrCl is unknown because both a height and weight (above a minimum accepted value) are required for this calculation. No results found for this basename: VANCOTROUGH:2,VANCOPEAK:2,VANCORANDOM:2,GENTTROUGH:2,GENTPEAK:2,GENTRANDOM:2,TOBRATROUGH:2,TOBRAPEAK:2,TOBRARND:2,AMIKACINPEAK:2,AMIKACINTROU:2,AMIKACIN:2, in the last 72 hours   Microbiology: Recent Results (from the past 720 hour(s))  MRSA PCR SCREENING     Status: Normal   Collection Time   09/21/12  2:34 PM      Component Value Range Status Comment   MRSA by PCR NEGATIVE  NEGATIVE Final   CULTURE, BLOOD (ROUTINE X 2)     Status: Normal (Preliminary result)   Collection Time   09/21/12  3:01 PM      Component Value Range Status Comment   Specimen Description BLOOD RIGHT ARM   Final    Special Requests BOTTLES DRAWN AEROBIC AND ANAEROBIC 10CC   Final    Culture  Setup Time 09/21/2012 23:40   Final    Culture     Final    Value:        BLOOD CULTURE RECEIVED NO GROWTH TO DATE CULTURE WILL BE HELD FOR 5 DAYS BEFORE ISSUING A FINAL NEGATIVE REPORT   Report Status PENDING   Incomplete   CULTURE, BLOOD (ROUTINE X 2)     Status: Normal (Preliminary result)   Collection Time   09/21/12  3:06 PM      Component Value Range Status Comment   Specimen Description BLOOD RIGHT HAND   Final    Special Requests BOTTLES DRAWN AEROBIC ONLY 10CC   Final    Culture  Setup Time 09/21/2012 23:40   Final    Culture     Final    Value:        BLOOD CULTURE RECEIVED NO GROWTH TO DATE CULTURE WILL BE HELD FOR 5 DAYS BEFORE ISSUING A FINAL NEGATIVE REPORT   Report Status PENDING   Incomplete   CULTURE, EXPECTORATED SPUTUM-ASSESSMENT     Status: Normal   Collection Time   09/21/12  4:38 PM      Component Value Range Status Comment   Specimen Description SPUTUM   Final    Special Requests NONE   Final    Sputum evaluation     Final    Value: THIS SPECIMEN IS ACCEPTABLE. RESPIRATORY CULTURE REPORT TO FOLLOW.   Report Status 09/21/2012 FINAL   Final   CULTURE, RESPIRATORY     Status: Normal   Collection Time   09/21/12  4:38 PM      Component Value Range Status Comment   Specimen Description SPUTUM   Final    Special Requests NONE   Final    Gram Stain     Final    Value: ABUNDANT WBC PRESENT, PREDOMINANTLY PMN     RARE SQUAMOUS EPITHELIAL CELLS PRESENT     MODERATE GRAM NEGATIVE RODS     MODERATE GRAM POSITIVE COCCI IN  PAIRS AND CHAINS     RARE GRAM POSITIVE RODS   Culture     Final    Value: ABUNDANT HAEMOPHILUS INFLUENZAE     Note: BETA LACTAMASE POSITIVE   Report Status 09/24/2012 FINAL   Final   RESPIRATORY VIRUS PANEL     Status: Abnormal   Collection Time   09/22/12  5:22 AM      Component Value Range Status Comment   Source - [facility] NASAL SWAB   Corrected CORRECTED ON 11/18 AT 1951: PREVIOUSLY REPORTED AS NASAL SWAB   Respiratory Syncytial Virus A NOT DETECTED   Final    Respiratory Syncytial Virus B NOT DETECTED   Final    Influenza A NOT DETECTED   Final    Influenza B NOT DETECTED   Final    Parainfluenza 1 NOT DETECTED   Final    Parainfluenza 2 NOT DETECTED   Final    Parainfluenza 3 NOT DETECTED   Final    Metapneumovirus NOT DETECTED   Final    Rhinovirus DETECTED (*)  Final    Adenovirus NOT DETECTED   Final    Influenza A H1 NOT DETECTED   Final    Influenza A H3 NOT DETECTED   Final     Medical History: Past  Medical History  Diagnosis Date  . Anemia   . Allergy   . Thyroid disease   . Parathyroid disease   . Headache   . Hypertension     takes  HYzaar daily  . Coronary artery disease     has 1 stent  . Myocardial infarct   . Asthma   . Emphysema     sees Dr.Ramaswami for this  . COPD (chronic obstructive pulmonary disease)     uses Albuterol and Spiriva daily  . Shortness of breath     with exertion   . Bronchitis   . Productive cough     white in color but no odor  . Sleep apnea     uses BiPaP  . Peripheral neuropathy   . Lung mass     right upper lobe  . Back pain     4 deteriorating disc and receives an injection q3-70mon;has been doing this for about 40yrs  . H/O hiatal hernia   . GERD (gastroesophageal reflux disease)     takes Prilosec daily  . Blood transfusion     as a child  . Glaucoma(365)     hx of  . Depression     takes Prozac daily  . Insomnia     takes Trazodone nightly  . Anginal pain   . Chronic kidney disease     acute kidney failure post surgery  . Pneumonia     hx of' 61 yo,rd' last time in 2012  . Type II diabetes mellitus     takes Metformin bid and Novolog and Lantus daily  . Arthritis     "hands"    Medications:  See PTA medication list  Assessment: 61 y/o male discharged 11/20 after being treated for suspected pneumonia who presents to the [facility] ED with abdominal pain, nausea, vomiting, and bloody diarrhea. He has hx of NSC lung cancer s/p lobectomy in 03/2012. Presently, he is afebrile but WBC are elevated. SCr is elevated above baseline. Pharmacy consulted to begin Cipro for diverticulitis vs colitis.  Goal of Therapy:  Resolution of infection  Plan:  -Cipro 400 mg IV q12h -Follow-up renal function  [facility], [address].D., BCPS Clinical  Pharmacist Pager: [phone] 09/26/2012 9:08 AM

## 2012-09-27 DIAGNOSIS — E119 Type 2 diabetes mellitus without complications: Secondary | ICD-10-CM

## 2012-09-27 DIAGNOSIS — N179 Acute kidney failure, unspecified: Secondary | ICD-10-CM

## 2012-09-27 DIAGNOSIS — R109 Unspecified abdominal pain: Secondary | ICD-10-CM

## 2012-09-27 DIAGNOSIS — K529 Noninfective gastroenteritis and colitis, unspecified: Secondary | ICD-10-CM

## 2012-09-27 DIAGNOSIS — J441 Chronic obstructive pulmonary disease with (acute) exacerbation: Secondary | ICD-10-CM

## 2012-09-27 DIAGNOSIS — K921 Melena: Secondary | ICD-10-CM

## 2012-09-27 LAB — BASIC METABOLIC PANEL
CO2: 28 mEq/L (ref 19–32)
GFR calc non Af Amer: 72 mL/min — ABNORMAL LOW (ref >90)
Glucose, Bld: 78 mg/dL (ref 70–99)
Potassium: 4 mEq/L (ref 3.5–5.1)
Sodium: 139 mEq/L (ref 135–145)

## 2012-09-27 LAB — CBC WITH DIFFERENTIAL/PLATELET
Basophils Absolute: 0 10*3/uL (ref 0.0–0.1)
Lymphocytes Relative: 10 % — ABNORMAL LOW (ref 12–46)
Lymphs Abs: 2 10*3/uL (ref 0.7–4.0)
Neutrophils Relative %: 83 % — ABNORMAL HIGH (ref 43–77)
Platelets: 335 10*3/uL (ref 150–400)
RBC: 4 MIL/uL — ABNORMAL LOW (ref 4.22–5.81)
RDW: 14 % (ref 11.5–15.5)
WBC: 20 10*3/uL — ABNORMAL HIGH (ref 4.0–10.5)

## 2012-09-27 LAB — GLUCOSE, CAPILLARY
Glucose-Capillary: 106 mg/dL — ABNORMAL HIGH (ref 70–99)
Glucose-Capillary: 67 mg/dL — ABNORMAL LOW (ref 70–99)
Glucose-Capillary: 151 mg/dL — ABNORMAL HIGH (ref 70–99)
Glucose-Capillary: 275 mg/dL — ABNORMAL HIGH (ref 70–99)
Glucose-Capillary: 295 mg/dL — ABNORMAL HIGH (ref 70–99)

## 2012-09-27 LAB — CULTURE, BLOOD (ROUTINE X 2): Culture: NO GROWTH

## 2012-09-27 MED ORDER — PREDNISONE 20 MG PO TABS
40.0000 mg | ORAL_TABLET | Freq: Every day | ORAL | Status: DC
  Administered 2012-09-28 – 2012-09-29 (×2): 40 mg via ORAL
  Filled 2012-09-27 (×3): qty 2

## 2012-09-27 MED ORDER — BIOTENE DRY MOUTH MT LIQD
15.0000 mL | Freq: Two times a day (BID) | OROMUCOSAL | Status: DC
  Administered 2012-09-27 – 2012-09-29 (×5): 15 mL via OROMUCOSAL

## 2012-09-27 MED ORDER — INSULIN GLARGINE 100 UNIT/ML ~~LOC~~ SOLN
70.0000 [IU] | Freq: Every morning | SUBCUTANEOUS | Status: DC
  Administered 2012-09-29: 70 [IU] via SUBCUTANEOUS

## 2012-09-27 MED ORDER — ASPIRIN EC 81 MG PO TBEC
81.0000 mg | DELAYED_RELEASE_TABLET | Freq: Every day | ORAL | Status: DC
  Administered 2012-09-27 – 2012-09-29 (×3): 81 mg via ORAL
  Filled 2012-09-27 (×3): qty 1

## 2012-09-27 MED ORDER — ADULT MULTIVITAMIN W/MINERALS CH
1.0000 | ORAL_TABLET | Freq: Every day | ORAL | Status: DC
  Administered 2012-09-27 – 2012-09-29 (×3): 1 via ORAL
  Filled 2012-09-27 (×3): qty 1

## 2012-09-27 MED ORDER — FLUOXETINE HCL 40 MG PO CAPS
40.0000 mg | ORAL_CAPSULE | Freq: Every day | ORAL | Status: DC
  Administered 2012-09-27 – 2012-09-29 (×3): 40 mg via ORAL
  Filled 2012-09-27 (×4): qty 1

## 2012-09-27 MED ORDER — IPRATROPIUM BROMIDE 0.02 % IN SOLN
0.5000 mg | Freq: Four times a day (QID) | RESPIRATORY_TRACT | Status: DC
  Administered 2012-09-27 – 2012-09-28 (×5): 0.5 mg via RESPIRATORY_TRACT
  Filled 2012-09-27 (×6): qty 2.5

## 2012-09-27 MED ORDER — DEXTROSE 50 % IV SOLN
INTRAVENOUS | Status: AC
  Administered 2012-09-27: 50 mL
  Filled 2012-09-27: qty 50

## 2012-09-27 MED ORDER — GABAPENTIN 300 MG PO CAPS
300.0000 mg | ORAL_CAPSULE | Freq: Three times a day (TID) | ORAL | Status: DC
  Administered 2012-09-27 – 2012-09-29 (×7): 300 mg via ORAL
  Filled 2012-09-27 (×9): qty 1

## 2012-09-27 NOTE — Progress Notes (Signed)
Pt. Refused cpap for tonight. Pt. States he just wants to wear his oxygen tonight.

## 2012-09-27 NOTE — Progress Notes (Addendum)
Subjective: Pt with better abd pain today.  BM this AM with dark blood c/w prev BMs.  Objective: Vital signs in last 24 hours: Temp:  [97.6 F (36.4 C)-97.9 F (36.6 C)] 97.9 F (36.6 C) (11/23 0444) Pulse Rate:  [66-78] 66  (11/23 0444) Resp:  [17-20] 17  (11/23 0444) BP: (126-155)/(77-80) 126/80 mmHg (11/23 0444) SpO2:  [91 %-99 %] 91 % (11/23 0444) Weight:  [270 lb 9.6 oz (122.743 kg)-287 lb 7.7 oz (130.401 kg)] 270 lb 9.6 oz (122.743 kg) (11/23 0444) Last BM Date: 09/26/12  Intake/Output from previous day: 11/22 0701 - 11/23 0700 In: 1700 [I.V.:1200; IV Piggyback:500] Out: 301 [Urine:300; Stool:1] Intake/Output this shift:    General appearance: alert and cooperative GI: soft, non-tender; bowel sounds normal; no masses,  no organomegaly  Lab Results:   [facility] 09/27/12 0626 09/26/12 0215  WBC 20.0* 21.7*  HGB 11.7* 13.5  HCT 34.4* 39.3  PLT 335 378   BMET  Basename 09/27/12 0626 09/26/12 1205  NA 139 137  K 4.0 4.2  CL 104 99  CO2 28 29  GLUCOSE 78 183*  BUN 27* 35*  CREATININE 1.07 1.11  CALCIUM 8.7 9.1   PT/INR No results found for this basename: LABPROT:2,INR:2 in the last 72 hours ABG No results found for this basename: PHART:2,PCO2:2,PO2:2,HCO3:2 in the last 72 hours  Studies/Results: Ct Abdomen Pelvis Wo Contrast  09/26/2012  *RADIOLOGY REPORT*  Clinical Data: Left lower quadrant abdominal pain with nausea vomiting.  Bloody stools.  CT ABDOMEN AND PELVIS WITHOUT CONTRAST  Technique:  Multidetector CT imaging of the abdomen and pelvis was performed following the standard protocol without intravenous contrast.  Comparison: 11/13/2011  Findings: Patchy airspace disease and both lower lobes has a nodular component.  Imaging features is suggestive of bibasilar bronchopneumonia.  No focal abnormalities seen in the liver or spleen on this study performed without intravenous contrast material.  Stomach, duodenum, pancreas, and adrenal glands are  unremarkable.  There may be some minimal layering sludge in the lumen of the gallbladder.  No abdominal aortic aneurysm.  Water density lesions are identified in both kidneys, compatible with cysts.  There is a small amount of free fluid around the liver and spleen.  No abdominal lymphadenopathy.  No bowel obstruction.  Imaging through the pelvis shows a small to moderate volume of free fluid.  No pelvic sidewall lymphadenopathy.  There are some small bowel loops in the right lower quadrant which show circumferential wall thickening and ill definition.  The mesentery assist with these bowel loops is congested and edematous.  No evidence for pneumatosis.  No features to suggest an overt bowel obstruction at this time.  No evidence for colonic diverticulitis.  The terminal ileum is normal.  The appendix is normal.  Bone windows reveal no worrisome lytic or sclerotic osseous lesions.  IMPRESSION: Small bowel loops in the right lower quadrant demonstrates circumferential bowel wall thickening with vascular congestion and edema within the adjacent mesentery.  Imaging features could be compatible with focal infectious or inflammatory enteritis. Ischemia would also be a consideration but without intravenous contrast, vascular patency and bowel wall enhancement cannot be assessed.   Original Report Authenticated By: Kennith Center, M.D.    Ct Angio Abd/pel W/ And/or W/o  09/26/2012  *RADIOLOGY REPORT*  Clinical Data:  Abdominal pain and abnormal thickening of small bowel in the right lower quadrant by unenhanced CT.  CT ANGIOGRAPHY ABDOMEN AND PELVIS  Technique:  Multidetector CT imaging of the abdomen and pelvis  was performed using the standard protocol during bolus administration of intravenous contrast.  Multiplanar reconstructed images including MIPs were obtained and reviewed to evaluate the vascular anatomy.  Contrast: OMNIPAQUE IOHEXOL 350 MG/ML SOLN  Comparison:  Unenhanced CT earlier today.  Findings:  The  abdominal aorta is of normal caliber and shows no evidence of aneurysmal disease or stenosis.  Scattered atherosclerotic plaque is present in the mid to distal aorta without evidence of dissection or penetrating ulcer disease.  The celiac axis and its branches are widely patent.  The superior mesenteric artery shows minimal plaque without evidence of stenosis.  Mild plaque is seen at the origins of bilateral single renal arteries without evidence of stenosis.  The inferior mesenteric artery is small caliber and open.  Iliac arteries and common femoral arteries show normal patency and scattered calcified plaque.  Thickened distal small bowel loops of associated free fluid in the pelvis again noted.  There is no evidence of bowel perforation.  No focal abscess is identified.  There are no soft tissue masses or enlarged lymph nodes.  No hernias are appreciated.  Bilateral renal cysts present which have a benign appearance.   Review of the MIP images confirms the above findings.  IMPRESSION: No evidence of significant mesenteric arterial occlusive disease.   Original Report Authenticated By: Irish Lack, M.D.     Anti-infectives: Anti-infectives     Start     Dose/Rate Route Frequency Ordered Stop   09/26/12 2000   ciprofloxacin (CIPRO) IVPB 400 mg        400 mg 200 mL/hr over 60 Minutes Intravenous Every 12 hours 09/26/12 1140     09/26/12 1200   metroNIDAZOLE (FLAGYL) IVPB 500 mg        500 mg 100 mL/hr over 60 Minutes Intravenous Every 8 hours 09/26/12 1037     09/26/12 1000   ciprofloxacin (CIPRO) IVPB 400 mg  Status:  Discontinued        400 mg 200 mL/hr over 60 Minutes Intravenous Every 12 hours 09/26/12 0912 09/26/12 1140   09/26/12 0400   metroNIDAZOLE (FLAGYL) IVPB 500 mg        500 mg 100 mL/hr over 60 Minutes Intravenous  Once 09/26/12 0355 09/26/12 0522   09/26/12 0400   ciprofloxacin (CIPRO) IVPB 200 mg        200 mg 100 mL/hr over 60 Minutes Intravenous  Once 09/26/12 0355  09/26/12 0648          Assessment/Plan: s/p * No surgery found * -abx per primary team -would adv PO slowly until abd pain resolved -no surgical plans at this time.  LOS: 1 day    Marigene Ehlers., [facility] 09/27/2012

## 2012-09-27 NOTE — Progress Notes (Addendum)
Patient ID: [name], male   DOB: [birth year], 61 y.o.   MRN: [id number] [facility]astro[facility]ology Progress Note  [name] 61 y.o. [birth year]   Subjective: Green loose stools. Tolerating clears that were started today. Lower abdominal pain better. No N/V. Wife at bedside  Objective: Vital signs: Filed Vitals:   09/27/12 0444  BP: 126/80  Pulse: 66  Temp: 97.9 F (36.6 C)  Resp: 17    Physical Exam: Gen: alert, no acute distress  Abd: suprapubic and LLQ tenderness with guarding, soft, nondistended, +BS  Lab Results:  [facility] 09/27/12 0626 09/26/12 1205  NA 139 137  K 4.0 4.2  CL 104 99  CO2 28 29  GLUCOSE 78 183*  BUN 27* 35*  CREATININE 1.07 1.11  CALCIUM 8.7 9.1  MG -- --  PHOS -- --    Basename 09/26/12 1205 09/26/12 0215  AST 12 10  ALT 27 32  ALKPHOS 73 69  BILITOT 0.2* 0.3  PROT 6.3 6.6  ALBUMIN 2.8* 2.9*    Basename 09/27/12 0626 09/26/12 0215  WBC 20.0* 21.7*  NEUTROABS 16.5* 19.4*  HGB 11.7* 13.5  HCT 34.4* 39.3  MCV 86.0 84.9  PLT 335 378      Assessment/Plan: Inflammatory enteritis: supportive care, continue clears today and consider advancing tomorrow, IVFs, Abx. Will follow.  Atoya Andrew C. 09/27/2012, 1:02 PM

## 2012-09-27 NOTE — Progress Notes (Signed)
[facility] PROGRESS NOTE  REBECCA MOTTA ZOX:[id number] DOB: May 22, 1951 DOA: 09/26/2012 PCP: Leo Grosser, MD  Assessment/Plan:  #1. Enteritis, likely infectious etiology.  No evidence of ischemia on CTa A/P.   -  Appreciate vascular and gen surg and GI recommendations -  Continue cipro/flagyl, day 2 -  Continue IVF -  Advance to clears  -  C diff neg -  Stool cx   Leukocytosis, stable from yesterday, likely due to gut inflammation -  Continue antibiotics as above  Acute renal failure - probably from dehydration and resolving with hydration.  Monitor for CIN. -  Trend electrolytes  Acute on chronic COPD exacerbation with SOB and diminished BS, resolving - Duonebs scheduled and albuterol prn  - Budesonide  - transition to oral steroid tomorrow - continue home oxygen  CAD status post stenting - denies any chest pain.  Diabetes mellitus2 uncontrolled -  -  Decrease lantus from 80 to 70 units due to hypoglycemia this morning.   -  Transition to ACHS insulin when tolerating diet  Hypertension - BP improved with pain control.  continue home medications.  Hyperlipidemia - stable.  continue home medications when tolerating PO  History of lung cancer status post lobectomy.  OSA on CPAP - continue CPAP per respiratory.  DIET:  Clear liquid ACCESS:  PIV IVF:  NS at 177ml/h PROPH:  SCDs TELE:  OFF   Code Status: Full code Family Communication: spoke with patient and wife who was at bedside Disposition Plan: pending    Consultants:  GI  VAscular surgery  General surgery  Procedures:  CT abd/pelvis  CT abd/pelvis with contrast  Antibiotics: cipro 11/22 >> Flagyl 11/22 >>   HPI/Subjective:  Had 3 watery with chunks black stools since yesterday.  Abdominal pain now a 6/10 from 10/10 yesterday.  Denies fevers, chills, nausea, vomiting, chest pain, shortness of breath, lower extremity edema.    Objective: Filed Vitals:   09/26/12 2339 09/26/12  2341 09/27/12 0444 09/27/12 0928  BP:   126/80   Pulse:  78 66   Temp:   97.9 F (36.6 C)   TempSrc:   Oral   Resp:  18 17   Height:      Weight:   122.743 kg (270 lb 9.6 oz)   SpO2: 98% 98% 91% 96%    Intake/Output Summary (Last 24 hours) at 09/27/12 1059 Last data filed at 09/27/12 8119  Gross per 24 hour  Intake   1700 ml  Output    301 ml  Net   1399 ml   Filed Weights   09/26/12 0900 09/27/12 0444  Weight: 130.401 kg (287 lb 7.7 oz) 122.743 kg (270 lb 9.6 oz)    Exam:   General:  Obese male, no acute distress, sitting at bedside  HEENT:  MMM  Cardiovascular: RRR, no mrg  Respiratory: Diminished bilaterally with expiratory wheeze, improved from yesterday.  No rales  Abdomen: Hyperactive BS, soft, mildly distended, tender to palpation diffusely particularly right quadrants without rebound or guarding.    Data Reviewed: Basic Metabolic Panel:  Lab 09/27/12 1478 09/26/12 1205 09/26/12 0215 09/24/12 0610 09/23/12 1356 09/23/12 0540 09/21/12 1453 09/21/12 1030  NA 139 137 131* 136 -- 140 -- --  K 4.0 4.2 4.3 4.4 -- 3.6 -- --  CL 104 99 95* 99 -- 103 -- --  CO2 28 29 23 26  -- 24 -- --  GLUCOSE 78 183* 305* 292* -- 156* -- --  BUN 27* 35* 42*  14 -- 12 -- --  CREATININE 1.07 1.11 1.36* 0.83 -- 0.80 -- --  CALCIUM 8.7 9.1 9.2 9.7 -- 8.8 -- --  MG -- -- -- 1.6 1.2* -- -- 1.1*  PHOS -- -- -- -- -- -- 3.3 --   Liver Function Tests:  Lab 09/26/12 1205 09/26/12 0215 09/22/12 0410  AST 12 10 24   ALT 27 32 21  ALKPHOS 73 69 107  BILITOT 0.2* 0.3 0.2*  PROT 6.3 6.6 6.8  ALBUMIN 2.8* 2.9* 2.8*   No results found for this basename: LIPASE:5,AMYLASE:5 in the last 168 hours No results found for this basename: AMMONIA:5 in the last 168 hours CBC:  Lab 09/27/12 0626 09/26/12 0215 09/24/12 0610 09/23/12 0540 09/22/12 0410  WBC 20.0* 21.7* 10.9* 11.8* 14.0*  NEUTROABS 16.5* 19.4* -- -- 11.2*  HGB 11.7* 13.5 11.1* 10.2* 10.4*  HCT 34.4* 39.3 32.5* 30.9* 30.5*  MCV  86.0 84.9 84.6 86.1 85.9  PLT 335 378 324 263 231   Cardiac Enzymes: No results found for this basename: CKTOTAL:5,CKMB:5,CKMBINDEX:5,TROPONINI:5 in the last 168 hours BNP (last 3 results)  Basename 04/23/12 1315 04/03/12 0550  PROBNP 501.5* 368.7*   CBG:  Lab 09/27/12 0946 09/27/12 0847 09/27/12 0403 09/26/12 2359 09/26/12 2006  GLUCAP 159* 67* 106* 151* 206*    Recent Results (from the past 240 hour(s))  MRSA PCR SCREENING     Status: Normal   Collection Time   09/21/12  2:34 PM      Component Value Range Status Comment   MRSA by PCR NEGATIVE  NEGATIVE Final   CULTURE, BLOOD (ROUTINE X 2)     Status: Normal (Preliminary result)   Collection Time   09/21/12  3:01 PM      Component Value Range Status Comment   Specimen Description BLOOD RIGHT ARM   Final    Special Requests BOTTLES DRAWN AEROBIC AND ANAEROBIC 10CC   Final    Culture  Setup Time 09/21/2012 23:40   Final    Culture     Final    Value:        BLOOD CULTURE RECEIVED NO GROWTH TO DATE CULTURE WILL BE HELD FOR 5 DAYS BEFORE ISSUING A FINAL NEGATIVE REPORT   Report Status PENDING   Incomplete   CULTURE, BLOOD (ROUTINE X 2)     Status: Normal (Preliminary result)   Collection Time   09/21/12  3:06 PM      Component Value Range Status Comment   Specimen Description BLOOD RIGHT HAND   Final    Special Requests BOTTLES DRAWN AEROBIC ONLY 10CC   Final    Culture  Setup Time 09/21/2012 23:40   Final    Culture     Final    Value:        BLOOD CULTURE RECEIVED NO GROWTH TO DATE CULTURE WILL BE HELD FOR 5 DAYS BEFORE ISSUING A FINAL NEGATIVE REPORT   Report Status PENDING   Incomplete   CULTURE, EXPECTORATED SPUTUM-ASSESSMENT     Status: Normal   Collection Time   09/21/12  4:38 PM      Component Value Range Status Comment   Specimen Description SPUTUM   Final    Special Requests NONE   Final    Sputum evaluation     Final    Value: THIS SPECIMEN IS ACCEPTABLE. RESPIRATORY CULTURE REPORT TO FOLLOW.   Report Status  09/21/2012 FINAL   Final   CULTURE, RESPIRATORY     Status: Normal  Collection Time   09/21/12  4:38 PM      Component Value Range Status Comment   Specimen Description SPUTUM   Final    Special Requests NONE   Final    Gram Stain     Final    Value: ABUNDANT WBC PRESENT, PREDOMINANTLY PMN     RARE SQUAMOUS EPITHELIAL CELLS PRESENT     MODERATE GRAM NEGATIVE RODS     MODERATE GRAM POSITIVE COCCI IN PAIRS AND CHAINS     RARE GRAM POSITIVE RODS   Culture     Final    Value: ABUNDANT HAEMOPHILUS INFLUENZAE     Note: BETA LACTAMASE POSITIVE   Report Status 09/24/2012 FINAL   Final   RESPIRATORY VIRUS PANEL     Status: Abnormal   Collection Time   09/22/12  5:22 AM      Component Value Range Status Comment   Source - RVPAN NASAL SWAB   Corrected CORRECTED ON 11/18 AT 1951: PREVIOUSLY REPORTED AS NASAL SWAB   Respiratory Syncytial Virus A NOT DETECTED   Final    Respiratory Syncytial Virus B NOT DETECTED   Final    Influenza A NOT DETECTED   Final    Influenza B NOT DETECTED   Final    Parainfluenza 1 NOT DETECTED   Final    Parainfluenza 2 NOT DETECTED   Final    Parainfluenza 3 NOT DETECTED   Final    Metapneumovirus NOT DETECTED   Final    Rhinovirus DETECTED (*)  Final    Adenovirus NOT DETECTED   Final    Influenza A H1 NOT DETECTED   Final    Influenza A H3 NOT DETECTED   Final   CLOSTRIDIUM DIFFICILE BY PCR     Status: Normal   Collection Time   09/26/12  3:34 PM      Component Value Range Status Comment   C difficile by pcr NEGATIVE  NEGATIVE Final      Studies: Ct Abdomen Pelvis Wo Contrast  09/26/2012  *RADIOLOGY REPORT*  Clinical Data: Left lower quadrant abdominal pain with nausea vomiting.  Bloody stools.  CT ABDOMEN AND PELVIS WITHOUT CONTRAST  Technique:  Multidetector CT imaging of the abdomen and pelvis was performed following the standard protocol without intravenous contrast.  Comparison: 11/13/2011  Findings: Patchy airspace disease and both lower lobes has  a nodular component.  Imaging features is suggestive of bibasilar bronchopneumonia.  No focal abnormalities seen in the liver or spleen on this study performed without intravenous contrast material.  Stomach, duodenum, pancreas, and adrenal glands are unremarkable.  There may be some minimal layering sludge in the lumen of the gallbladder.  No abdominal aortic aneurysm.  Water density lesions are identified in both kidneys, compatible with cysts.  There is a small amount of free fluid around the liver and spleen.  No abdominal lymphadenopathy.  No bowel obstruction.  Imaging through the pelvis shows a small to moderate volume of free fluid.  No pelvic sidewall lymphadenopathy.  There are some small bowel loops in the right lower quadrant which show circumferential wall thickening and ill definition.  The mesentery assist with these bowel loops is congested and edematous.  No evidence for pneumatosis.  No features to suggest an overt bowel obstruction at this time.  No evidence for colonic diverticulitis.  The terminal ileum is normal.  The appendix is normal.  Bone windows reveal no worrisome lytic or sclerotic osseous lesions.  IMPRESSION: Small bowel loops  in the right lower quadrant demonstrates circumferential bowel wall thickening with vascular congestion and edema within the adjacent mesentery.  Imaging features could be compatible with focal infectious or inflammatory enteritis. Ischemia would also be a consideration but without intravenous contrast, vascular patency and bowel wall enhancement cannot be assessed.   Original Report Authenticated By: Kennith Center, M.D.    Ct Angio Abd/pel W/ And/or W/o  09/26/2012  *RADIOLOGY REPORT*  Clinical Data:  Abdominal pain and abnormal thickening of small bowel in the right lower quadrant by unenhanced CT.  CT ANGIOGRAPHY ABDOMEN AND PELVIS  Technique:  Multidetector CT imaging of the abdomen and pelvis was performed using the standard protocol during bolus  administration of intravenous contrast.  Multiplanar reconstructed images including MIPs were obtained and reviewed to evaluate the vascular anatomy.  Contrast: OMNIPAQUE IOHEXOL 350 MG/ML SOLN  Comparison:  Unenhanced CT earlier today.  Findings:  The abdominal aorta is of normal caliber and shows no evidence of aneurysmal disease or stenosis.  Scattered atherosclerotic plaque is present in the mid to distal aorta without evidence of dissection or penetrating ulcer disease.  The celiac axis and its branches are widely patent.  The superior mesenteric artery shows minimal plaque without evidence of stenosis.  Mild plaque is seen at the origins of bilateral single renal arteries without evidence of stenosis.  The inferior mesenteric artery is small caliber and open.  Iliac arteries and common femoral arteries show normal patency and scattered calcified plaque.  Thickened distal small bowel loops of associated free fluid in the pelvis again noted.  There is no evidence of bowel perforation.  No focal abscess is identified.  There are no soft tissue masses or enlarged lymph nodes.  No hernias are appreciated.  Bilateral renal cysts present which have a benign appearance.   Review of the MIP images confirms the above findings.  IMPRESSION: No evidence of significant mesenteric arterial occlusive disease.   Original Report Authenticated By: Irish Lack, M.D.     Scheduled Meds:   . antiseptic oral rinse  15 mL Mouth Rinse BID  . budesonide  0.5 mg Nebulization BID  . ciprofloxacin  400 mg Intravenous Q12H  . [COMPLETED] dextrose      . diltiazem  120 mg Oral Daily  . insulin aspart  0-9 Units Subcutaneous Q4H  . insulin glargine  80 Units Subcutaneous q morning - 10a  . ipratropium  0.5 mg Nebulization Q4H  . levalbuterol  0.63 mg Nebulization Q6H  . magnesium gluconate  500 mg Oral TID PC & HS  . methylPREDNISolone (SOLU-MEDROL) injection  20 mg Intravenous Daily  . metronidazole  500 mg  Intravenous Q8H  . pantoprazole (PROTONIX) IV  40 mg Intravenous Q1200  . sodium chloride  3 mL Intravenous Q12H  . thiamine  100 mg Oral Daily  . traZODone  100 mg Oral QHS  . [DISCONTINUED] albuterol  2.5 mg Nebulization Q4H  . [DISCONTINUED] ciprofloxacin  400 mg Intravenous Q12H  . [DISCONTINUED] insulin aspart  0-9 Units Subcutaneous TID WC  . [DISCONTINUED] ipratropium  0.5 mg Nebulization Q4H   Continuous Infusions:   . 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 If 8PM-8AM, please contact night-coverage at [email or web address], password [facility] 09/27/2012, 10:59 AM  LOS:  1 day

## 2012-09-27 NOTE — Consult Note (Signed)
CTA does not support intestinal ischemia.  No embolic event.  Possible enteritis, non-ischemic  Joel Mericle O. Gae Bon, MD, FACS [phone] [facility]

## 2012-09-27 NOTE — Progress Notes (Signed)
VASCULAR PROGRESS NOTE  SUBJECTIVE: Abdominal pain much improved. No nausea and vomiting.   PHYSICAL EXAM: Filed Vitals:   09/26/12 2114 09/26/12 2339 09/26/12 2341 09/27/12 0444  BP:    126/80  Pulse:   78 66  Temp:    97.9 F (36.6 C)  TempSrc:    Oral  Resp:   18 17  Height:      Weight:    270 lb 9.6 oz (122.743 kg)  SpO2: 98% 98% 98% 91%   Abdomen is soft and non tender.   LABS: Lab Results  Component Value Date   WBC 21.7* 09/26/2012   HGB 13.5 09/26/2012   HCT 39.3 09/26/2012   MCV 84.9 09/26/2012   PLT 378 09/26/2012   Lab Results  Component Value Date   CREATININE 1.11 09/26/2012   Lab Results  Component Value Date   INR 1.00 03/13/2012   CBG (last 3)   Basename 09/27/12 0403 09/26/12 2359 09/26/12 2006  GLUCAP 106* 151* 206*   ASSESSMENT/PLAN: 1. CT scan with contrast shows no evidence of mesenteric arterial occlusive disease.  2. GI and General Surgery following.   Cari Caraway Beeper: [phone] 09/27/2012

## 2012-09-27 NOTE — Progress Notes (Signed)
Pt tolerating clear liquid diet well, no c/o n/v or increase in pain.

## 2012-09-27 NOTE — Progress Notes (Signed)
Pt cbg 67, pt npo so gave amp D50, md aware, will continue to monitor.  Follow up cbg 151 @ 0900

## 2012-09-28 ENCOUNTER — Encounter (HOSPITAL_COMMUNITY): Payer: Self-pay | Admitting: *Deleted

## 2012-09-28 DIAGNOSIS — K5289 Other specified noninfective gastroenteritis and colitis: Secondary | ICD-10-CM

## 2012-09-28 LAB — GLUCOSE, CAPILLARY
Glucose-Capillary: 175 mg/dL — ABNORMAL HIGH (ref 70–99)
Glucose-Capillary: 381 mg/dL — ABNORMAL HIGH (ref 70–99)
Glucose-Capillary: 409 mg/dL — ABNORMAL HIGH (ref 70–99)

## 2012-09-28 LAB — CBC WITH DIFFERENTIAL/PLATELET
Eosinophils Absolute: 0.2 10*3/uL (ref 0.0–0.7)
Hemoglobin: 11.4 g/dL — ABNORMAL LOW (ref 13.0–17.0)
Lymphocytes Relative: 12 % (ref 12–46)
Lymphs Abs: 2 10*3/uL (ref 0.7–4.0)
MCH: 28.6 pg (ref 26.0–34.0)
Monocytes Relative: 6 % (ref 3–12)
Neutrophils Relative %: 80 % — ABNORMAL HIGH (ref 43–77)
RBC: 3.99 MIL/uL — ABNORMAL LOW (ref 4.22–5.81)
WBC: 16.6 10*3/uL — ABNORMAL HIGH (ref 4.0–10.5)

## 2012-09-28 MED ORDER — PANTOPRAZOLE SODIUM 40 MG PO TBEC
40.0000 mg | DELAYED_RELEASE_TABLET | ORAL | Status: DC
  Administered 2012-09-28 – 2012-09-29 (×2): 40 mg via ORAL
  Filled 2012-09-28 (×2): qty 1

## 2012-09-28 MED ORDER — LEVALBUTEROL HCL 0.63 MG/3ML IN NEBU
0.6300 mg | INHALATION_SOLUTION | Freq: Two times a day (BID) | RESPIRATORY_TRACT | Status: DC
  Administered 2012-09-29: 0.63 mg via RESPIRATORY_TRACT
  Filled 2012-09-28 (×3): qty 3

## 2012-09-28 MED ORDER — TRAMADOL HCL 50 MG PO TABS
50.0000 mg | ORAL_TABLET | Freq: Once | ORAL | Status: AC
  Administered 2012-09-28: 50 mg via ORAL
  Filled 2012-09-28: qty 1

## 2012-09-28 MED ORDER — IPRATROPIUM BROMIDE 0.02 % IN SOLN
0.5000 mg | Freq: Two times a day (BID) | RESPIRATORY_TRACT | Status: DC
  Administered 2012-09-29: 0.5 mg via RESPIRATORY_TRACT
  Filled 2012-09-28: qty 2.5

## 2012-09-28 NOTE — Progress Notes (Signed)
PT has refused CPAP for the night. PT states he does not like the way our masks fit. PT currently on RA with no distress. RT will continue to monitor.

## 2012-09-28 NOTE — Progress Notes (Signed)
[facility] PROGRESS NOTE  [name] ZOX:[id number] DOB: Mar 08, 1951 DOA: 09/26/2012 PCP: Leo Grosser, MD  Assessment/Plan:  #1. Enteritis, likely infectious etiology.  No evidence of ischemia on CTa A/P.   -  Appreciate vascular and gen surg and GI recommendations -  Continue cipro/flagyl, day 3 -  Continue IVF -  Advance to FLD -  C diff neg -  Stool cx ordered but not collected  Leukocytosis, trending down, likely due to enteritis -  Continue antibiotics as above  Acute renal failure - probably from dehydration and resolving with hydration.  Monitor for CIN. -  Trend electrolytes  Acute on chronic COPD exacerbation with SOB and diminished BS, resolving - Duonebs scheduled and albuterol prn  - Budesonide  - day 1 of pred 40mg  - continue home oxygen  CAD status post stenting - denies any chest pain.  Diabetes mellitus2 uncontrolled - near hypoglycemia this morning, but first dose of reduced insulin given this AM and diet advancing -  Continue lantus from 70 units -  Transition to ACHS insulin when tolerating diet.    Hypertension - BP improved with pain control.  continue home medications.  Hyperlipidemia - stable.  continue home medications when tolerating PO  History of lung cancer status post lobectomy.  OSA on CPAP - continue CPAP per respiratory.  DIET:  Clear liquid ACCESS:  PIV IVF:  NS at 180ml/h PROPH:  SCDs TELE:  OFF   Code Status: Full code Family Communication: spoke with patient and wife who was at bedside Disposition Plan: pending    Consultants:  GI  VAscular surgery  General surgery  Procedures:  CT abd/pelvis  CT abd/pelvis with contrast  Antibiotics: cipro 11/22 >> Flagyl 11/22 >>   HPI/Subjective:  Had 3 watery dark stools.  Stools are still frequent, but less volume.  Denies blood in stools.  Abdominal pain now a 3/10.  Denies fevers, chills, nausea, vomiting, chest pain, shortness of breath, lower  extremity edema.    Objective: Filed Vitals:   09/27/12 2100 09/28/12 0500 09/28/12 0824 09/28/12 1400  BP: 131/64 108/60  137/61  Pulse: 71 73  77  Temp: 99.1 F (37.3 C) 97.5 F (36.4 C)  98 F (36.7 C)  TempSrc:    Oral  Resp: 18 18    Height:      Weight:      SpO2: 100% 96% 98% 97%    Intake/Output Summary (Last 24 hours) at 09/28/12 1422 Last data filed at 09/28/12 1300  Gross per 24 hour  Intake    360 ml  Output      0 ml  Net    360 ml   Filed Weights   09/26/12 0900 09/27/12 0444  Weight: 130.401 kg (287 lb 7.7 oz) 122.743 kg (270 lb 9.6 oz)    Exam:   General:  Obese male, no acute distress, sitting at bedside  HEENT:  MMM  Cardiovascular: RRR, no mrg  Respiratory: Diminished bilaterally to mid-back with expiratory wheeze, improved from yesterday.  No rales Abdomen: Hyperactive BS, soft, mildly distended, nontender   Data Reviewed: Basic Metabolic Panel:  Lab 09/27/12 8119 09/26/12 1205 09/26/12 0215 09/24/12 0610 09/23/12 1356 09/23/12 0540 09/21/12 1453  NA 139 137 131* 136 -- 140 --  K 4.0 4.2 4.3 4.4 -- 3.6 --  CL 104 99 95* 99 -- 103 --  CO2 28 29 23 26  -- 24 --  GLUCOSE 78 183* 305* 292* -- 156* --  BUN 27*  35* 42* 14 -- 12 --  CREATININE 1.07 1.11 1.36* 0.83 -- 0.80 --  CALCIUM 8.7 9.1 9.2 9.7 -- 8.8 --  MG -- -- -- 1.6 1.2* -- --  PHOS -- -- -- -- -- -- 3.3   Liver Function Tests:  Lab 09/26/12 1205 09/26/12 0215 09/22/12 0410  AST 12 10 24   ALT 27 32 21  ALKPHOS 73 69 107  BILITOT 0.2* 0.3 0.2*  PROT 6.3 6.6 6.8  ALBUMIN 2.8* 2.9* 2.8*   No results found for this basename: LIPASE:5,AMYLASE:5 in the last 168 hours No results found for this basename: AMMONIA:5 in the last 168 hours CBC:  Lab 09/28/12 0950 09/27/12 0626 09/26/12 0215 09/24/12 0610 09/23/12 0540 09/22/12 0410  WBC 16.6* 20.0* 21.7* 10.9* 11.8* --  NEUTROABS 13.4* 16.5* 19.4* -- -- 11.2*  HGB 11.4* 11.7* 13.5 11.1* 10.2* --  HCT 34.0* 34.4* 39.3 32.5* 30.9* --   MCV 85.2 86.0 84.9 84.6 86.1 --  PLT 311 335 378 324 263 --   Cardiac Enzymes: No results found for this basename: CKTOTAL:5,CKMB:5,CKMBINDEX:5,TROPONINI:5 in the last 168 hours BNP (last 3 results)  Basename 04/23/12 1315 04/03/12 0550  PROBNP 501.5* 368.7*   CBG:  Lab 09/28/12 1141 09/28/12 0832 09/28/12 0406 09/28/12 0013 09/27/12 1952  GLUCAP 156* 175* 128* 227* 295*    Recent Results (from the past 240 hour(s))  MRSA PCR SCREENING     Status: Normal   Collection Time   09/21/12  2:34 PM      Component Value Range Status Comment   MRSA by PCR NEGATIVE  NEGATIVE Final   CULTURE, BLOOD (ROUTINE X 2)     Status: Normal   Collection Time   09/21/12  3:01 PM      Component Value Range Status Comment   Specimen Description BLOOD RIGHT ARM   Final    Special Requests BOTTLES DRAWN AEROBIC AND ANAEROBIC 10CC   Final    Culture  Setup Time 09/21/2012 23:40   Final    Culture NO GROWTH 5 DAYS   Final    Report Status 09/27/2012 FINAL   Final   CULTURE, BLOOD (ROUTINE X 2)     Status: Normal   Collection Time   09/21/12  3:06 PM      Component Value Range Status Comment   Specimen Description BLOOD RIGHT HAND   Final    Special Requests BOTTLES DRAWN AEROBIC ONLY 10CC   Final    Culture  Setup Time 09/21/2012 23:40   Final    Culture NO GROWTH 5 DAYS   Final    Report Status 09/27/2012 FINAL   Final   CULTURE, EXPECTORATED SPUTUM-ASSESSMENT     Status: Normal   Collection Time   09/21/12  4:38 PM      Component Value Range Status Comment   Specimen Description SPUTUM   Final    Special Requests NONE   Final    Sputum evaluation     Final    Value: THIS SPECIMEN IS ACCEPTABLE. RESPIRATORY CULTURE REPORT TO FOLLOW.   Report Status 09/21/2012 FINAL   Final   CULTURE, RESPIRATORY     Status: Normal   Collection Time   09/21/12  4:38 PM      Component Value Range Status Comment   Specimen Description SPUTUM   Final    Special Requests NONE   Final    Gram Stain     Final     Value: ABUNDANT WBC PRESENT,  PREDOMINANTLY PMN     RARE SQUAMOUS EPITHELIAL CELLS PRESENT     MODERATE GRAM NEGATIVE RODS     MODERATE GRAM POSITIVE COCCI IN PAIRS AND CHAINS     RARE GRAM POSITIVE RODS   Culture     Final    Value: ABUNDANT HAEMOPHILUS INFLUENZAE     Note: BETA LACTAMASE POSITIVE   Report Status 09/24/2012 FINAL   Final   RESPIRATORY VIRUS PANEL     Status: Abnormal   Collection Time   09/22/12  5:22 AM      Component Value Range Status Comment   Source - RVPAN NASAL SWAB   Corrected CORRECTED ON 11/18 AT 1951: PREVIOUSLY REPORTED AS NASAL SWAB   Respiratory Syncytial Virus A NOT DETECTED   Final    Respiratory Syncytial Virus B NOT DETECTED   Final    Influenza A NOT DETECTED   Final    Influenza B NOT DETECTED   Final    Parainfluenza 1 NOT DETECTED   Final    Parainfluenza 2 NOT DETECTED   Final    Parainfluenza 3 NOT DETECTED   Final    Metapneumovirus NOT DETECTED   Final    Rhinovirus DETECTED (*)  Final    Adenovirus NOT DETECTED   Final    Influenza A H1 NOT DETECTED   Final    Influenza A H3 NOT DETECTED   Final   CLOSTRIDIUM DIFFICILE BY PCR     Status: Normal   Collection Time   09/26/12  3:34 PM      Component Value Range Status Comment   C difficile by pcr NEGATIVE  NEGATIVE Final      Studies: Ct Angio Abd/pel W/ And/or W/o  09/26/2012  *RADIOLOGY REPORT*  Clinical Data:  Abdominal pain and abnormal thickening of small bowel in the right lower quadrant by unenhanced CT.  CT ANGIOGRAPHY ABDOMEN AND PELVIS  Technique:  Multidetector CT imaging of the abdomen and pelvis was performed using the standard protocol during bolus administration of intravenous contrast.  Multiplanar reconstructed images including MIPs were obtained and reviewed to evaluate the vascular anatomy.  Contrast: OMNIPAQUE IOHEXOL 350 MG/ML SOLN  Comparison:  Unenhanced CT earlier today.  Findings:  The abdominal aorta is of normal caliber and shows no evidence of aneurysmal  disease or stenosis.  Scattered atherosclerotic plaque is present in the mid to distal aorta without evidence of dissection or penetrating ulcer disease.  The celiac axis and its branches are widely patent.  The superior mesenteric artery shows minimal plaque without evidence of stenosis.  Mild plaque is seen at the origins of bilateral single renal arteries without evidence of stenosis.  The inferior mesenteric artery is small caliber and open.  Iliac arteries and common femoral arteries show normal patency and scattered calcified plaque.  Thickened distal small bowel loops of associated free fluid in the pelvis again noted.  There is no evidence of bowel perforation.  No focal abscess is identified.  There are no soft tissue masses or enlarged lymph nodes.  No hernias are appreciated.  Bilateral renal cysts present which have a benign appearance.   Review of the MIP images confirms the above findings.  IMPRESSION: No evidence of significant mesenteric arterial occlusive disease.   Original Report Authenticated By: Irish Lack, M.D.     Scheduled Meds:    . antiseptic oral rinse  15 mL Mouth Rinse BID  . aspirin EC  81 mg Oral Daily  . budesonide  0.5 mg Nebulization BID  . ciprofloxacin  400 mg Intravenous Q12H  . diltiazem  120 mg Oral Daily  . FLUoxetine  40 mg Oral Daily  . gabapentin  300 mg Oral TID  . insulin aspart  0-9 Units Subcutaneous Q4H  . insulin glargine  70 Units Subcutaneous q morning - 10a  . ipratropium  0.5 mg Nebulization Q6H  . levalbuterol  0.63 mg Nebulization Q6H  . magnesium gluconate  500 mg Oral TID PC & HS  . metronidazole  500 mg Intravenous Q8H  . multivitamin with minerals  1 tablet Oral Daily  . pantoprazole  40 mg Oral Q24H  . predniSONE  40 mg Oral Q breakfast  . sodium chloride  3 mL Intravenous Q12H  . thiamine  100 mg Oral Daily  . [COMPLETED] traMADol  50 mg Oral Once  . traZODone  100 mg Oral QHS  . [DISCONTINUED] pantoprazole (PROTONIX) IV  40  mg Intravenous Q1200   Continuous Infusions:   Principal Problem:  *Enteritis Active Problems:  C O P D  Anemia  COPD exacerbation  Leukocytosis  Abdominal pain  Nausea vomiting and diarrhea  OSA on CPAP    Time spent: 30    Stavroula Rohde, [facility]  [facility] Pager [phone]. If 8PM-8AM, please contact night-coverage at [email or web address], password [facility] 09/28/2012, 2:22 PM  LOS: 2 days

## 2012-09-28 NOTE — Progress Notes (Addendum)
Patient had an episode of trigeminy and bigeminy PVC's @ 6:33 AM. Patient was asymptomatic and resting. Will continue to monitor patient.

## 2012-09-28 NOTE — Progress Notes (Signed)
Order received to discontinue telemetry. I went to d/c the order but it appears it was already D/C'd. I confirmed with Dr. Malachi Bonds and she stated it was ok to d/c

## 2012-09-28 NOTE — Progress Notes (Signed)
Patient ID: [name], male   DOB: 01-20-1951, 61 y.o.   MRN: [id number] [facility] Gastroenterology Progress Note  [name] 61 y.o. 04/04/1951   Subjective: Tolerating clears. Resting. No complaints. Dark brown to black stools.  Objective: Vital signs in last 24 hours: Filed Vitals:   09/28/12 0500  BP: 108/60  Pulse: 73  Temp: 97.5 F (36.4 C)  Resp: 18    Physical Exam: Gen: alert, no acute distress, obese Abd: nontender, soft, nondistended, +BS  Lab Results:  [facility] 09/27/12 0626 09/26/12 1205  NA 139 137  K 4.0 4.2  CL 104 99  CO2 28 29  GLUCOSE 78 183*  BUN 27* 35*  CREATININE 1.07 1.11  CALCIUM 8.7 9.1  MG -- --  PHOS -- --    Basename 09/26/12 1205 09/26/12 0215  AST 12 10  ALT 27 32  ALKPHOS 73 69  BILITOT 0.2* 0.3  PROT 6.3 6.6  ALBUMIN 2.8* 2.9*    Basename 09/28/12 0950 09/27/12 0626  WBC 16.6* 20.0*  NEUTROABS 13.4* 16.5*  HGB 11.4* 11.7*  HCT 34.0* 34.4*  MCV 85.2 86.0  PLT 311 335   No results found for this basename: LABPROT:2,INR:2 in the last 72 hours    Assessment/Plan: 61 yo with resolving inflammatory enteritis. Black stools likely due to this enteritis. Hgb stable. Will manage conservatively. Agree with advancing to full liquids per [facility]. Dr. Kris Hartmann will see this week.   Sunset Joshi C. 09/28/2012, 11:04 AM

## 2012-09-28 NOTE — Progress Notes (Signed)
Report given to Robin on 5500. Pt transported to 5500 in wheelchair in stable condition. Assessment unchanged from this am

## 2012-09-28 NOTE — Progress Notes (Signed)
Subjective: No complaints. Feeling much better.   Objective: Vital signs in last 24 hours: Temp:  [97.5 F (36.4 C)-99.1 F (37.3 C)] 97.5 F (36.4 C) (11/24 0500) Pulse Rate:  [71-81] 73  (11/24 0500) Resp:  [16-18] 18  (11/24 0500) BP: (108-138)/(60-77) 108/60 mmHg (11/24 0500) SpO2:  [93 %-100 %] 98 % (11/24 0824) Last BM Date: 09/28/12  Intake/Output from previous day: 11/23 0701 - 11/24 0700 In: 480 [P.O.:480] Out: -  Intake/Output this shift:    GI: soft, nontender. pt reports flatus  Lab Results:   Basename 09/27/12 0626 09/26/12 0215  WBC 20.0* 21.7*  HGB 11.7* 13.5  HCT 34.4* 39.3  PLT 335 378   BMET  Basename 09/27/12 0626 09/26/12 1205  NA 139 137  K 4.0 4.2  CL 104 99  CO2 28 29  GLUCOSE 78 183*  BUN 27* 35*  CREATININE 1.07 1.11  CALCIUM 8.7 9.1   PT/INR No results found for this basename: LABPROT:2,INR:2 in the last 72 hours ABG No results found for this basename: PHART:2,PCO2:2,PO2:2,HCO3:2 in the last 72 hours  Studies/Results: Ct Angio Abd/pel W/ And/or W/o  09/26/2012  *RADIOLOGY REPORT*  Clinical Data:  Abdominal pain and abnormal thickening of small bowel in the right lower quadrant by unenhanced CT.  CT ANGIOGRAPHY ABDOMEN AND PELVIS  Technique:  Multidetector CT imaging of the abdomen and pelvis was performed using the standard protocol during bolus administration of intravenous contrast.  Multiplanar reconstructed images including MIPs were obtained and reviewed to evaluate the vascular anatomy.  Contrast: OMNIPAQUE IOHEXOL 350 MG/ML SOLN  Comparison:  Unenhanced CT earlier today.  Findings:  The abdominal aorta is of normal caliber and shows no evidence of aneurysmal disease or stenosis.  Scattered atherosclerotic plaque is present in the mid to distal aorta without evidence of dissection or penetrating ulcer disease.  The celiac axis and its branches are widely patent.  The superior mesenteric artery shows minimal plaque without  evidence of stenosis.  Mild plaque is seen at the origins of bilateral single renal arteries without evidence of stenosis.  The inferior mesenteric artery is small caliber and open.  Iliac arteries and common femoral arteries show normal patency and scattered calcified plaque.  Thickened distal small bowel loops of associated free fluid in the pelvis again noted.  There is no evidence of bowel perforation.  No focal abscess is identified.  There are no soft tissue masses or enlarged lymph nodes.  No hernias are appreciated.  Bilateral renal cysts present which have a benign appearance.   Review of the MIP images confirms the above findings.  IMPRESSION: No evidence of significant mesenteric arterial occlusive disease.   Original Report Authenticated By: Irish Lack, M.D.     Anti-infectives: Anti-infectives     Start     Dose/Rate Route Frequency Ordered Stop   09/26/12 2000   ciprofloxacin (CIPRO) IVPB 400 mg        400 mg 200 mL/hr over 60 Minutes Intravenous Every 12 hours 09/26/12 1140     09/26/12 1200   metroNIDAZOLE (FLAGYL) IVPB 500 mg        500 mg 100 mL/hr over 60 Minutes Intravenous Every 8 hours 09/26/12 1037     09/26/12 1000   ciprofloxacin (CIPRO) IVPB 400 mg  Status:  Discontinued        400 mg 200 mL/hr over 60 Minutes Intravenous Every 12 hours 09/26/12 0912 09/26/12 1140   09/26/12 0400   metroNIDAZOLE (FLAGYL) IVPB 500  mg        500 mg 100 mL/hr over 60 Minutes Intravenous  Once 09/26/12 0355 09/26/12 0522   09/26/12 0400   ciprofloxacin (CIPRO) IVPB 200 mg        200 mg 100 mL/hr over 60 Minutes Intravenous  Once 09/26/12 0355 09/26/12 0648          Assessment/Plan: s/p * No surgery found * Advance diet to full liquids Continue abx Check wbc  LOS: 2 days    TOTH III,PAUL S 09/28/2012

## 2012-09-28 NOTE — Progress Notes (Signed)
VASCULAR PROGRESS NOTE  SUBJECTIVE: comfortable. Abdominal pain has improved significantly. He does state that he had black stool this morning.  PHYSICAL EXAM: Filed Vitals:   09/27/12 1608 09/27/12 2031 09/27/12 2100 09/28/12 0500  BP:   131/64 108/60  Pulse:   71 73  Temp:   99.1 F (37.3 C) 97.5 F (36.4 C)  TempSrc:      Resp:   18 18  Height:      Weight:      SpO2: 93% 100% 100% 96%   Abdomen soft with no focal tenderness.  LABS: Lab Results  Component Value Date   WBC 20.0* 09/27/2012   HGB 11.7* 09/27/2012   HCT 34.4* 09/27/2012   MCV 86.0 09/27/2012   PLT 335 09/27/2012   ASSESSMENT/PLAN: 1. Abdominal pain is resolving. CT scan shows no evidence of acute mesenteric ischemia.  2. Patient does note melena. GI is following. 3. Vascular surgery will be available if needed.  Cari Caraway Beeper: [phone] 09/28/2012

## 2012-09-29 LAB — GLUCOSE, CAPILLARY: Glucose-Capillary: 178 mg/dL — ABNORMAL HIGH (ref 70–99)

## 2012-09-29 MED ORDER — CIPROFLOXACIN HCL 500 MG PO TABS: 500.0000 mg | ORAL_TABLET | Freq: Two times a day (BID) | ORAL | Status: DC

## 2012-09-29 MED ORDER — INSULIN ASPART 100 UNIT/ML ~~LOC~~ SOLN: 2.0000 [IU] | Freq: Three times a day (TID) | SUBCUTANEOUS | Status: DC

## 2012-09-29 MED ORDER — INSULIN ASPART 100 UNIT/ML ~~LOC~~ SOLN: 6.0000 [IU] | Freq: Once | SUBCUTANEOUS | Status: DC

## 2012-09-29 MED ORDER — INSULIN ASPART 100 UNIT/ML ~~LOC~~ SOLN: 0.0000 [IU] | Freq: Three times a day (TID) | SUBCUTANEOUS | Status: DC

## 2012-09-29 MED ORDER — INSULIN ASPART 100 UNIT/ML ~~LOC~~ SOLN: 10.0000 [IU] | Freq: Three times a day (TID) | SUBCUTANEOUS | Status: DC

## 2012-09-29 MED ORDER — INSULIN ASPART 100 UNIT/ML ~~LOC~~ SOLN: 0.0000 [IU] | Freq: Every day | SUBCUTANEOUS | Status: DC

## 2012-09-29 MED ORDER — PREDNISONE 5 MG PO TABS: 5.0000 mg | ORAL_TABLET | Freq: Every day | ORAL | Status: AC

## 2012-09-29 MED ORDER — BECLOMETHASONE DIPROPIONATE 80 MCG/ACT IN AERS: 2.0000 | INHALATION_SPRAY | Freq: Two times a day (BID) | RESPIRATORY_TRACT | Status: DC

## 2012-09-29 MED ORDER — PREDNISONE 20 MG PO TABS
20.0000 mg | ORAL_TABLET | Freq: Every day | ORAL | Status: DC
  Filled 2012-09-29: qty 1

## 2012-09-29 MED ORDER — METRONIDAZOLE 500 MG PO TABS: 500.0000 mg | ORAL_TABLET | Freq: Three times a day (TID) | ORAL | Status: DC

## 2012-09-29 MED ORDER — INSULIN ASPART 100 UNIT/ML ~~LOC~~ SOLN
10.0000 [IU] | Freq: Once | SUBCUTANEOUS | Status: AC
  Administered 2012-09-29: 10 [IU] via SUBCUTANEOUS

## 2012-09-29 NOTE — Care Management Note (Signed)
    Page 1 of 1   09/29/2012     7:14:31 PM   CARE MANAGEMENT NOTE 09/29/2012  Patient:  [name]   Account Number:  [id number]  Date Initiated:  09/29/2012  Documentation initiated by:  Letha Cape  Subjective/Objective Assessment:   dx enteritis  admit- lives with spouse.     Action/Plan:   Anticipated DC Date:  09/29/2012   Anticipated DC Plan:  HOME/SELF CARE      DC Planning Services  CM consult      Choice offered to / List presented to:             Status of service:  Completed, signed off Medicare Important Message given?   (If response is "NO", the following Medicare IM given date fields will be blank) Date Medicare IM given:   Date Additional Medicare IM given:    Discharge Disposition:  HOME/SELF CARE  Per UR Regulation:  Reviewed for med. necessity/level of care/duration of stay  If discussed at Long Length of Stay Meetings, dates discussed:    Comments:  09/29/12 19:12 Letha Cape RN, BSN [phone] patient lives with spouse, pta independent, no needs anticipated.

## 2012-09-29 NOTE — Discharge Summary (Signed)
Physician Discharge Summary  BLAKELEE FULCO ZOX:[id number] DOB: [birth year] DOA: 09/26/2012  PCP: Leo Grosser, MD  Admit date: 09/26/2012 Discharge date: 09/29/2012  Recommendations for Outpatient Follow-up:  *Home with antibiotics for 10 more days to complete a 14-day course. *Prednisone taper:  Take 4 tabs for 2 days, 3 tabs for 3 days, 2 tabs for 3 days, and then 1 tab for 4 days and stop * PCP to follow up on results of stool culture which is pending at the time of discharge. * Meal time insulin:  CBG 70 - 120: 0 units CBG 121 - 150: 5 units CBG 151 - 200: 10 units CBG 201 - 250: 15 units CBG 251 - 300: 20 units CBG 301 - 350: 25 units CBG 351 - 400: 30 units *  PCP to check CBC and BMP at follow up appointment in 1 week   Discharge Diagnoses:  Principal Problem:  *Enteritis Active Problems:  C O P D  Anemia  COPD exacerbation  Leukocytosis  Abdominal pain  Nausea vomiting and diarrhea  OSA on CPAP   Discharge Condition: stable improved  Diet recommendation: healthy heart  Wt Readings from Last 3 Encounters:  09/29/12 125.1 kg (275 lb 12.7 oz)  09/21/12 130.4 kg (287 lb 7.7 oz)  08/21/12 130.364 kg (287 lb 6.4 oz)    History of present illness:   61 year-old male with history of COPD and home oxygen, OSA on CPAP, CAD status post stenting, history of lung cancer status post lobectomy who was just recently discharged from hospital 2 days ago presents with complaint of persistent abdominal pain. Patient's abdominal pain is mostly suprapubic and left lower quadrant. It is colicky in nature. It is associated nausea vomiting and diarrhea. Patient's diarrhea sometimes bloody. Denies any fever chills. Patient's recent admission which was later antibiotics for bronchitis. In the ER patient was found to have significant leukocytosis but his hemoglobin was stable. Patient was afebrile. On exam patient had tenderness in the left lower quadrant and suprapubic area. At  this time patient has been admitted for possible diverticulitis versus colitis. CAT scan abdomen and pelvis has been ordered and has been pending. Patient is not in any respiratory distress and denies chest pain. Patient denies using any NSAIDs.   Hospital Course:   Enteritis, likely infectious etiology.  Initial CT was done without contrast and was concerning for possible mesenteric ischemia.  GI, vascular surgery, and general surgery were consulted and ordered CTa A/P which was negative for ischemia.  He was started on a clear liquid diet and was tolerating a carbohydrate modified diet at the time of discharge .  He should take ciprofloxacin and flagyl to complete a 14 day course, 10 more days left.  Stool culture is pending at the time of discharge, and patient may have his PCP follow up on the results when he arrives in clinic in 1 week.  Leukocytosis, trending down, likely due to enteritis and steroid use.  Continue antibiotics as above and recheck CBC in 1 week.    Acute renal failure - probably from dehydration and resolving with hydration. Creatinine currently 1.  Repeat in 1 week by PCP.  Acute on chronic COPD exacerbation with SOB and diminished BS, resolving.  Continue steroid taper at home with albuterol every 4-6 hours.  Continue home oxygen  CAD status post stenting - denies any chest pain.  Diabetes mellitus2 uncontrolled:  Continue lantus at home dose with SSI with meals.  PCP may need to  adjust SSI as steroids are tapered. Hypertension - BP improved with pain control. continue home medications.  Hyperlipidemia - stable. continue home medications when tolerating PO  History of lung cancer status post lobectomy.  OSA on CPAP - continue CPAP per respiratory.  Consultants:  GI  VAscular surgery  General surgery Procedures:  CT abd/pelvis  CT abd/pelvis with contrast Antibiotics:  cipro 11/22 >>  Flagyl 11/22 >>   Discharge Exam: Filed Vitals:   09/29/12 1401  BP: 156/82   Pulse: 77  Temp: 97.9 F (36.6 C)  Resp: 18   Filed Vitals:   09/29/12 0431 09/29/12 0740 09/29/12 1028 09/29/12 1401  BP: 130/67  129/55 156/82  Pulse: 68  66 77  Temp: 97.4 F (36.3 C)   97.9 F (36.6 C)  TempSrc: Oral   Oral  Resp: 16   18  Height:      Weight: 125.1 kg (275 lb 12.7 oz)     SpO2: 94% 95%  94%    General: Obese male, no acute distress, sitting at bedside  HEENT: MMM  Cardiovascular: RRR, no mrg  Respiratory: Diminished bilaterally at bases with expiratory wheeze, improved from yesterday. No rales Abdomen: Hyperactive BS, soft, mildly distended, nontender  Discharge Instructions      Discharge Orders    Future Appointments: Provider: Department: Dept Phone: Center:   10/07/2012 10:40 AM [facility] [place] [facility] [phone] [facility]   10/07/2012 12:30 PM Alleen Borne, MD [facility] [phone] [facility]     Future Orders Please Complete By Expires   Diet - low sodium heart healthy      Comments:   Lactose free   Diet Carb Modified      Increase activity slowly      Discharge instructions      Comments:   You were hospitalized with abdominal pain.  You were found to have inflammation of your small bowel/intestine which improved with antibiotics.  You should complete a total of 14 days of treatment with antibiotics for this infection.   For your breathing, please continue a steroid taper:  *Prednisone taper:  Take 4 tabs for 2 days starting tomorrow, then 3 tabs for 3 days, 2 tabs for 3 days, and then 1 tab for 4 days and stop.   Please stop your levofloxacin. Please eat a low-lactose diet for the next 3-4 weeks because milk products can make diarrhea worse after this type of illness. Regarding your diabetes:  Please continue your lantus.  For your mealtime fingersticks, use the following: CBG 70 - 120: 0 units CBG 121 - 150: 5 units CBG 151 - 200: 10 units CBG 201 - 250: 15  units CBG 251 - 300: 20 units CBG 301 - 350: 25 units CBG 351 - 400: 30 units   Call MD for:  temperature >100.4      Call MD for:  persistant nausea and vomiting      Call MD for:  severe uncontrolled pain      Call MD for:  difficulty breathing, headache or visual disturbances      Call MD for:  hives      Call MD for:  persistant dizziness or light-headedness      Call MD for:  extreme fatigue          Medication List     As of 09/29/2012  4:04 PM    STOP taking these medications  levofloxacin 500 MG tablet   Commonly known as: LEVAQUIN      TAKE these medications         acetaminophen 500 MG tablet   Commonly known as: TYLENOL   Take 1,000 mg by mouth every 6 (six) hours as needed. For pain      albuterol (2.5 MG/3ML) 0.083% nebulizer solution   Commonly known as: PROVENTIL   Take 3 mLs (2.5 mg total) by nebulization every 4 (four) hours as needed. For shortness of breath      ALKA-SELTZER PLUS COLD & FLU PO   Take 1 packet by mouth daily as needed. For cold/flu symptoms (cough, congestion, etc.)      aspirin EC 81 MG tablet   Take 81 mg by mouth daily.      atorvastatin 20 MG tablet   Commonly known as: LIPITOR   Take 20 mg by mouth daily.      beclomethasone 80 MCG/ACT inhaler   Commonly known as: QVAR   Inhale 2 puffs into the lungs 2 (two) times daily. For shortness of breath      calcium carbonate 1250 MG tablet   Commonly known as: OS-CAL - dosed in mg of elemental calcium   Take 1 tablet by mouth daily.      ciprofloxacin 500 MG tablet   Commonly known as: CIPRO   Take 1 tablet (500 mg total) by mouth 2 (two) times daily.      diltiazem 120 MG 24 hr capsule   Commonly known as: CARDIZEM CD   Take 120 mg by mouth daily.      FLUoxetine 40 MG capsule   Commonly known as: PROZAC   Take 40 mg by mouth daily.      gabapentin 300 MG capsule   Commonly known as: NEURONTIN   Take 300 mg by mouth 3 (three) times daily.      insulin aspart  100 UNIT/ML injection   Commonly known as: novoLOG   Inject 10-25 Units into the skin 3 (three) times daily before meals. CBG < 70: implement hypoglycemia protocol  CBG 70 - 120: 0 units  CBG 121 - 150: 5 units  CBG 151 - 200: 10 units  CBG 201 - 250: 15 units  CBG 251 - 300: 20 units  CBG 301 - 350: 25 units  CBG 351 - 400: 30 units      insulin glargine 100 UNIT/ML injection   Commonly known as: LANTUS   Inject 80 Units into the skin every morning.      levalbuterol 0.63 MG/3ML nebulizer solution   Commonly known as: XOPENEX   Take 1 ampule by nebulization every 4 (four) hours as needed. For shortness of breath      Magnesium 250 MG Tabs   Take 500 mg by mouth 4 (four) times daily.      metroNIDAZOLE 500 MG tablet   Commonly known as: FLAGYL   Take 1 tablet (500 mg total) by mouth 3 (three) times daily.      multivitamin with minerals Tabs   Take 1 tablet by mouth daily.      oxyCODONE 5 MG immediate release tablet   Commonly known as: Oxy IR/ROXICODONE   Take 5 mg by mouth every 8 (eight) hours as needed. For pain      pantoprazole 40 MG tablet   Commonly known as: PROTONIX   Take 40 mg by mouth daily.      predniSONE 5 MG tablet  Commonly known as: DELTASONE   Take 1-6 tablets (5-30 mg total) by mouth daily. Take 4 tabs for 2 days, 3 tabs for 3 days, 2 tabs for 3 days, and then 1 tab for 4 days and stop      thiamine 100 MG tablet   Commonly known as: VITAMIN B-1   Take 100 mg by mouth daily.      tiotropium 18 MCG inhalation capsule   Commonly known as: SPIRIVA   Place 18 mcg into inhaler and inhale daily.      traZODone 100 MG tablet   Commonly known as: DESYREL   Take 100 mg by mouth at bedtime.         Follow-up Information    Follow up with [facility] TOM, MD. In 2 weeks. (fingerstick and resp. exam)    Contact information:   [address] [place] [address] [phone]       Follow up with SCHOOLER,VINCENT C., MD.  Schedule an appointment as soon as possible for a visit in 3 weeks.   Contact information:   [address], [address] AND [address]nie [address] [place] [address] [phone]           The results of significant diagnostics from this hospitalization (including imaging, microbiology, ancillary and laboratory) are listed below for reference.    Significant Diagnostic Studies: Ct Abdomen Pelvis Wo Contrast  09/26/2012  *RADIOLOGY REPORT*  Clinical Data: Left lower quadrant abdominal pain with nausea vomiting.  Bloody stools.  CT ABDOMEN AND PELVIS WITHOUT CONTRAST  Technique:  Multidetector CT imaging of the abdomen and pelvis was performed following the standard protocol without intravenous contrast.  Comparison: 11/13/2011  Findings: Patchy airspace disease and both lower lobes has a nodular component.  Imaging features is suggestive of bibasilar bronchopneumonia.  No focal abnormalities seen in the liver or spleen on this study performed without intravenous contrast material.  Stomach, duodenum, pancreas, and adrenal glands are unremarkable.  There may be some minimal layering sludge in the lumen of the gallbladder.  No abdominal aortic aneurysm.  Water density lesions are identified in both kidneys, compatible with cysts.  There is a small amount of free fluid around the liver and spleen.  No abdominal lymphadenopathy.  No bowel obstruction.  Imaging through the pelvis shows a small to moderate volume of free fluid.  No pelvic sidewall lymphadenopathy.  There are some small bowel loops in the right lower quadrant which show circumferential wall thickening and ill definition.  The mesentery assist with these bowel loops is congested and edematous.  No evidence for pneumatosis.  No features to suggest an overt bowel obstruction at this time.  No evidence for colonic diverticulitis.  The terminal ileum is normal.  The appendix is normal.  Bone windows reveal no worrisome lytic or  sclerotic osseous lesions.  IMPRESSION: Small bowel loops in the right lower quadrant demonstrates circumferential bowel wall thickening with vascular congestion and edema within the adjacent mesentery.  Imaging features could be compatible with focal infectious or inflammatory enteritis. Ischemia would also be a consideration but without intravenous contrast, vascular patency and bowel wall enhancement cannot be assessed.   Original Report Authenticated By: Kennith Center, M.D.    Dg Chest 2 View  09/22/2012  *RADIOLOGY REPORT*  Clinical Data: Shortness of breath, possible pneumonia, diabetes  CHEST - 2 VIEW  Comparison: Chest x-ray of 09/21/2012  Findings: Postsurgical changes in the right hemithorax are stable with volume loss  and scarring.  There is cardiomegaly present and there may be mild pulmonary vascular congestion present.  No effusion is seen.  There are degenerative changes noted in the lower thoracic spine.  IMPRESSION:  1.  Cardiomegaly.  Question mild pulmonary vascular congestion. 2.  Stable postoperative changes in the right hemithorax.   Original Report Authenticated By: Dwyane Dee, M.D.    Dg Chest 2 View  09/21/2012  *RADIOLOGY REPORT*  Clinical Data: Shortness of breath, cough  CHEST - 2 VIEW  Comparison: 07/22/2012  Findings: Postsurgical changes in the right hemithorax with volume loss and tenting of the right hemidiaphragm.  Chronic interstitial markings/emphysematous changes.  No focal consolidation.  Stable borderline cardiomegaly.  Degenerative changes of the visualized thoracolumbar spine.  IMPRESSION: Postsurgical changes in the right hemithorax.  Chronic interstitial markings/emphysematous changes.   Original Report Authenticated By: Charline Bills, M.D.    Dg Chest Port 1 View  09/23/2012  *RADIOLOGY REPORT*  Clinical Data: Cough, shortness of breath, follow up of infiltrate  PORTABLE CHEST - 1 VIEW  Comparison: Chest x-ray of 09/22/2012  Findings: The lungs are not quite  as well aerated.  There is slight opacity at the right lung base most consistent with atelectasis. Heart size is stable.  IMPRESSION: Diminished aeration.  Slight increase in opacity at the right lung base.   Original Report Authenticated By: Dwyane Dee, M.D.    Ct Angio Abd/pel W/ And/or W/o  09/26/2012  *RADIOLOGY REPORT*  Clinical Data:  Abdominal pain and abnormal thickening of small bowel in the right lower quadrant by unenhanced CT.  CT ANGIOGRAPHY ABDOMEN AND PELVIS  Technique:  Multidetector CT imaging of the abdomen and pelvis was performed using the standard protocol during bolus administration of intravenous contrast.  Multiplanar reconstructed images including MIPs were obtained and reviewed to evaluate the vascular anatomy.  Contrast: OMNIPAQUE IOHEXOL 350 MG/ML SOLN  Comparison:  Unenhanced CT earlier today.  Findings:  The abdominal aorta is of normal caliber and shows no evidence of aneurysmal disease or stenosis.  Scattered atherosclerotic plaque is present in the mid to distal aorta without evidence of dissection or penetrating ulcer disease.  The celiac axis and its branches are widely patent.  The superior mesenteric artery shows minimal plaque without evidence of stenosis.  Mild plaque is seen at the origins of bilateral single renal arteries without evidence of stenosis.  The inferior mesenteric artery is small caliber and open.  Iliac arteries and common femoral arteries show normal patency and scattered calcified plaque.  Thickened distal small bowel loops of associated free fluid in the pelvis again noted.  There is no evidence of bowel perforation.  No focal abscess is identified.  There are no soft tissue masses or enlarged lymph nodes.  No hernias are appreciated.  Bilateral renal cysts present which have a benign appearance.   Review of the MIP images confirms the above findings.  IMPRESSION: No evidence of significant mesenteric arterial occlusive disease.   Original Report  Authenticated By: Irish Lack, M.D.     Microbiology: Recent Results (from the past 240 hour(s))  MRSA PCR SCREENING     Status: Normal   Collection Time   09/21/12  2:34 PM      Component Value Range Status Comment   MRSA by PCR NEGATIVE  NEGATIVE Final   CULTURE, BLOOD (ROUTINE X 2)     Status: Normal   Collection Time   09/21/12  3:01 PM      Component Value Range  Status Comment   Specimen Description BLOOD RIGHT ARM   Final    Special Requests BOTTLES DRAWN AEROBIC AND ANAEROBIC 10CC   Final    Culture  Setup Time 09/21/2012 23:40   Final    Culture NO GROWTH 5 DAYS   Final    Report Status 09/27/2012 FINAL   Final   CULTURE, BLOOD (ROUTINE X 2)     Status: Normal   Collection Time   09/21/12  3:06 PM      Component Value Range Status Comment   Specimen Description BLOOD RIGHT HAND   Final    Special Requests BOTTLES DRAWN AEROBIC ONLY 10CC   Final    Culture  Setup Time 09/21/2012 23:40   Final    Culture NO GROWTH 5 DAYS   Final    Report Status 09/27/2012 FINAL   Final   CULTURE, EXPECTORATED SPUTUM-ASSESSMENT     Status: Normal   Collection Time   09/21/12  4:38 PM      Component Value Range Status Comment   Specimen Description SPUTUM   Final    Special Requests NONE   Final    Sputum evaluation     Final    Value: THIS SPECIMEN IS ACCEPTABLE. RESPIRATORY CULTURE REPORT TO FOLLOW.   Report Status 09/21/2012 FINAL   Final   CULTURE, RESPIRATORY     Status: Normal   Collection Time   09/21/12  4:38 PM      Component Value Range Status Comment   Specimen Description SPUTUM   Final    Special Requests NONE   Final    Gram Stain     Final    Value: ABUNDANT WBC PRESENT, PREDOMINANTLY PMN     RARE SQUAMOUS EPITHELIAL CELLS PRESENT     MODERATE GRAM NEGATIVE RODS     MODERATE GRAM POSITIVE COCCI IN PAIRS AND CHAINS     RARE GRAM POSITIVE RODS   Culture     Final    Value: ABUNDANT HAEMOPHILUS INFLUENZAE     Note: BETA LACTAMASE POSITIVE   Report Status  09/24/2012 FINAL   Final   RESPIRATORY VIRUS PANEL     Status: Abnormal   Collection Time   09/22/12  5:22 AM      Component Value Range Status Comment   Source - [facility] NASAL SWAB   Corrected CORRECTED ON 11/18 AT 1951: PREVIOUSLY REPORTED AS NASAL SWAB   Respiratory Syncytial Virus A NOT DETECTED   Final    Respiratory Syncytial Virus B NOT DETECTED   Final    Influenza A NOT DETECTED   Final    Influenza B NOT DETECTED   Final    Parainfluenza 1 NOT DETECTED   Final    Parainfluenza 2 NOT DETECTED   Final    Parainfluenza 3 NOT DETECTED   Final    Metapneumovirus NOT DETECTED   Final    Rhinovirus DETECTED (*)  Final    Adenovirus NOT DETECTED   Final    Influenza A H1 NOT DETECTED   Final    Influenza A H3 NOT DETECTED   Final   CLOSTRIDIUM DIFFICILE BY PCR     Status: Normal   Collection Time   09/26/12  3:34 PM      Component Value Range Status Comment   C difficile by pcr NEGATIVE  NEGATIVE Final      Labs: Basic Metabolic Panel:  Lab 09/27/12 9562 09/26/12 1205 09/26/12 0215 09/24/12 0610 09/23/12 1356 09/23/12  0540  NA 139 137 131* 136 -- 140  K 4.0 4.2 4.3 4.4 -- 3.6  CL 104 99 95* 99 -- 103  CO2 28 29 23 26  -- 24  GLUCOSE 78 183* 305* 292* -- 156*  BUN 27* 35* 42* 14 -- 12  CREATININE 1.07 1.11 1.36* 0.83 -- 0.80  CALCIUM 8.7 9.1 9.2 9.7 -- 8.8  MG -- -- -- 1.6 1.2* --  PHOS -- -- -- -- -- --   Liver Function Tests:  Lab 09/26/12 1205 09/26/12 0215  AST 12 10  ALT 27 32  ALKPHOS 73 69  BILITOT 0.2* 0.3  PROT 6.3 6.6  ALBUMIN 2.8* 2.9*   No results found for this basename: LIPASE:5,AMYLASE:5 in the last 168 hours No results found for this basename: AMMONIA:5 in the last 168 hours CBC:  Lab 09/28/12 0950 09/27/12 0626 09/26/12 0215 09/24/12 0610 09/23/12 0540  WBC 16.6* 20.0* 21.7* 10.9* 11.8*  NEUTROABS 13.4* 16.5* 19.4* -- --  HGB 11.4* 11.7* 13.5 11.1* 10.2*  HCT 34.0* 34.4* 39.3 32.5* 30.9*  MCV 85.2 86.0 84.9 84.6 86.1  PLT 311 335 378 324 263     Cardiac Enzymes: No results found for this basename: CKTOTAL:5,CKMB:5,CKMBINDEX:5,TROPONINI:5 in the last 168 hours BNP: BNP (last 3 results)  Basename 04/23/12 1315 04/03/12 0550  PROBNP 501.5* 368.7*   CBG:  Lab 09/29/12 1154 09/29/12 0803 09/29/12 0429 09/29/12 0008 09/28/12 2007  GLUCAP 178* 99 143* 430* 409*    Time coordinating discharge: 45 minutes  Signed:  Carlous Olivares  [facility] 09/29/2012, 4:04 PM

## 2012-09-29 NOTE — Progress Notes (Signed)
ANTIBIOTIC CONSULT NOTE - FOLLOW UP  Pharmacy Consult for Cipro Indication: Enteritis  Allergies  Allergen Reactions  . Dilaudid (Hydromorphone Hcl)     Made him crazy    Patient Measurements: Height: 5\' 11"  (180.3 cm) Weight: 275 lb 12.7 oz (125.1 kg) IBW/kg (Calculated) : 75.3  Adjusted Body Weight:   Vital Signs: Temp: 97.4 F (36.3 C) (11/25 0431) Temp src: Oral (11/25 0431) BP: 129/55 mmHg (11/25 1028) Pulse Rate: 66  (11/25 1028) Intake/Output from previous day: 11/24 0701 - 11/25 0700 In: 1360 [P.O.:360; IV Piggyback:1000] Out: -  Intake/Output from this shift:    Labs:  [facility] 09/28/12 0950 09/27/12 0626  WBC 16.6* 20.0*  HGB 11.4* 11.7*  PLT 311 335  LABCREA -- --  CREATININE -- 1.07   Estimated Creatinine Clearance: 96.4 ml/min (by C-G formula based on Cr of 1.07). No results found for this basename: VANCOTROUGH:2,VANCOPEAK:2,VANCORANDOM:2,GENTTROUGH:2,GENTPEAK:2,GENTRANDOM:2,TOBRATROUGH:2,TOBRAPEAK:2,TOBRARND:2,AMIKACINPEAK:2,AMIKACINTROU:2,AMIKACIN:2, in the last 72 hours   Microbiology: Recent Results (from the past 720 hour(s))  MRSA PCR SCREENING     Status: Normal   Collection Time   09/21/12  2:34 PM      Component Value Range Status Comment   MRSA by PCR NEGATIVE  NEGATIVE Final   CULTURE, BLOOD (ROUTINE X 2)     Status: Normal   Collection Time   09/21/12  3:01 PM      Component Value Range Status Comment   Specimen Description BLOOD RIGHT ARM   Final    Special Requests BOTTLES DRAWN AEROBIC AND ANAEROBIC 10CC   Final    Culture  Setup Time 09/21/2012 23:40   Final    Culture NO GROWTH 5 DAYS   Final    Report Status 09/27/2012 FINAL   Final   CULTURE, BLOOD (ROUTINE X 2)     Status: Normal   Collection Time   09/21/12  3:06 PM      Component Value Range Status Comment   Specimen Description BLOOD RIGHT HAND   Final    Special Requests BOTTLES DRAWN AEROBIC ONLY 10CC   Final    Culture  Setup Time 09/21/2012 23:40   Final    Culture NO GROWTH 5 DAYS   Final    Report Status 09/27/2012 FINAL   Final   CULTURE, EXPECTORATED SPUTUM-ASSESSMENT     Status: Normal   Collection Time   09/21/12  4:38 PM      Component Value Range Status Comment   Specimen Description SPUTUM   Final    Special Requests NONE   Final    Sputum evaluation     Final    Value: THIS SPECIMEN IS ACCEPTABLE. RESPIRATORY CULTURE REPORT TO FOLLOW.   Report Status 09/21/2012 FINAL   Final   CULTURE, RESPIRATORY     Status: Normal   Collection Time   09/21/12  4:38 PM      Component Value Range Status Comment   Specimen Description SPUTUM   Final    Special Requests NONE   Final    Gram Stain     Final    Value: ABUNDANT WBC PRESENT, PREDOMINANTLY PMN     RARE SQUAMOUS EPITHELIAL CELLS PRESENT     MODERATE GRAM NEGATIVE RODS     MODERATE GRAM POSITIVE COCCI IN PAIRS AND CHAINS     RARE GRAM POSITIVE RODS   Culture     Final    Value: ABUNDANT HAEMOPHILUS INFLUENZAE     Note: BETA LACTAMASE POSITIVE   Report Status 09/24/2012 FINAL  Final   RESPIRATORY VIRUS PANEL     Status: Abnormal   Collection Time   09/22/12  5:22 AM      Component Value Range Status Comment   Source - [facility] NASAL SWAB   Corrected CORRECTED ON 11/18 AT 1951: PREVIOUSLY REPORTED AS NASAL SWAB   Respiratory Syncytial Virus A NOT DETECTED   Final    Respiratory Syncytial Virus B NOT DETECTED   Final    Influenza A NOT DETECTED   Final    Influenza B NOT DETECTED   Final    Parainfluenza 1 NOT DETECTED   Final    Parainfluenza 2 NOT DETECTED   Final    Parainfluenza 3 NOT DETECTED   Final    Metapneumovirus NOT DETECTED   Final    Rhinovirus DETECTED (*)  Final    Adenovirus NOT DETECTED   Final    Influenza A H1 NOT DETECTED   Final    Influenza A H3 NOT DETECTED   Final   CLOSTRIDIUM DIFFICILE BY PCR     Status: Normal   Collection Time   09/26/12  3:34 PM      Component Value Range Status Comment   C difficile by pcr NEGATIVE  NEGATIVE Final      Anti-infectives     Start     Dose/Rate Route Frequency Ordered Stop   09/26/12 2000   ciprofloxacin (CIPRO) IVPB 400 mg        400 mg 200 mL/hr over 60 Minutes Intravenous Every 12 hours 09/26/12 1140     09/26/12 1200   metroNIDAZOLE (FLAGYL) IVPB 500 mg        500 mg 100 mL/hr over 60 Minutes Intravenous Every 8 hours 09/26/12 1037     09/26/12 1000   ciprofloxacin (CIPRO) IVPB 400 mg  Status:  Discontinued        400 mg 200 mL/hr over 60 Minutes Intravenous Every 12 hours 09/26/12 0912 09/26/12 1140   09/26/12 0400   metroNIDAZOLE (FLAGYL) IVPB 500 mg        500 mg 100 mL/hr over 60 Minutes Intravenous  Once 09/26/12 0355 09/26/12 0522   09/26/12 0400   ciprofloxacin (CIPRO) IVPB 200 mg        200 mg 100 mL/hr over 60 Minutes Intravenous  Once 09/26/12 0355 09/26/12 0648          Assessment: 61 YOM admitted 11/22 with Abd pain (discharged 11/20 after being treated for suspected pneumonia)  Anticoagulation: none PTA, Has refused SCDs.  Infectious Disease: Cipro/Flagyl for ?diverticulits vs colitis. Pt with bloody diarrhea. CT abd/pelvis shows enteritis. WBC 16.6, afebrile. Doses ok for renal function.   Plan:  Continue same Cipro and Flagyl doses. Pharmacy will sign off. Please reconsult for further dosing assistance.  Merilynn Finland, [employer] 09/29/2012,12:57 PM

## 2012-09-29 NOTE — Progress Notes (Signed)
[facility] Progress Note  Subjective: Feels much better, tolerating diet. Denies abdominal pain or diarrhea.  Objective: Vital signs in last 24 hours: Temp:  [97.4 F (36.3 C)-98 F (36.7 C)] 97.4 F (36.3 C) (11/25 0431) Pulse Rate:  [66-77] 66  (11/25 1028) Resp:  [16-20] 16  (11/25 0431) BP: (129-145)/(55-79) 129/55 mmHg (11/25 1028) SpO2:  [93 %-97 %] 95 % (11/25 0740) Weight:  [125.1 kg (275 lb 12.7 oz)-125.8 kg (277 lb 5.4 oz)] 125.1 kg (275 lb 12.7 oz) (11/25 0431) Weight change:    PE: Abdomen soft  Lab Results: Results for orders placed during the hospital encounter of 09/26/12 (from the past 24 hour(s))  GLUCOSE, CAPILLARY     Status: Abnormal   Collection Time   09/28/12  5:25 PM      Component Value Range   Glucose-Capillary 381 (*) 70 - 99 mg/dL   Comment 1 Notify RN    GLUCOSE, CAPILLARY     Status: Abnormal   Collection Time   09/28/12  8:07 PM      Component Value Range   Glucose-Capillary 409 (*) 70 - 99 mg/dL   Comment 1 Notify RN    GLUCOSE, CAPILLARY     Status: Abnormal   Collection Time   09/29/12 12:08 AM      Component Value Range   Glucose-Capillary 430 (*) 70 - 99 mg/dL   Comment 1 Notify RN    GLUCOSE, CAPILLARY     Status: Abnormal   Collection Time   09/29/12  4:29 AM      Component Value Range   Glucose-Capillary 143 (*) 70 - 99 mg/dL   Comment 1 Notify RN    GLUCOSE, CAPILLARY     Status: Normal   Collection Time   09/29/12  8:03 AM      Component Value Range   Glucose-Capillary 99  70 - 99 mg/dL  GLUCOSE, CAPILLARY     Status: Abnormal   Collection Time   09/29/12 11:54 AM      Component Value Range   Glucose-Capillary 178 (*) 70 - 99 mg/dL    Studies/Results: No results found.    Assessment: Presumed acute infectious enteritis, CT angiogram negative for ischemic phenomena  Plan: Continue Cipro and expected management. Will check CBC tomorrow since this was not done today. Probably can tomorrow with  continued outpatient antibiotics    Aalliyah Kilker C 09/29/2012, 1:10 PM

## 2012-09-29 NOTE — Progress Notes (Signed)
Pt reports BM. No n/v/abd pain. Tolerated diet abd soft.   Process appears resolved. If recurs, will need addl workup  Mary Sella. Andrey Campanile, MD, FACS General, Bariatric, & Minimally Invasive Surgery [facility], [place]

## 2012-09-29 NOTE — Progress Notes (Signed)
Subjective: Patient feels great today.  Pt denies any abdominal pain, N/V, fever/chills.  Pt is up ambulating OOB and urinating normally. Pt is passing gas, but no BM yet.  Pt is tolerating his diet well and very hungry and would like regular food.  Objective: Vital signs in last 24 hours: Temp:  [97.4 F (36.3 C)-98 F (36.7 C)] 97.4 F (36.3 C) (11/25 0431) Pulse Rate:  [68-77] 68  (11/25 0431) Resp:  [16-20] 16  (11/25 0431) BP: (130-145)/(61-79) 130/67 mmHg (11/25 0431) SpO2:  [93 %-97 %] 95 % (11/25 0740) Weight:  [275 lb 12.7 oz (125.1 kg)-277 lb 5.4 oz (125.8 kg)] 275 lb 12.7 oz (125.1 kg) (11/25 0431) Last BM Date: 09/28/12  Intake/Output from previous day: 11/24 0701 - 11/25 0700 In: 1360 [P.O.:360; IV Piggyback:1000] Out: -  Intake/Output this shift:    PE: Gen:  Alert, NAD, pleasant Card:  RRR, no M/G/R heard Pulm:  CTA, no W/R/R Abd: Soft, NT/ND, +BS, no HSM   Lab Results:   [facility] 09/28/12 0950 09/27/12 0626  WBC 16.6* 20.0*  HGB 11.4* 11.7*  HCT 34.0* 34.4*  PLT 311 335   BMET  Basename 09/27/12 0626 09/26/12 1205  NA 139 137  K 4.0 4.2  CL 104 99  CO2 28 29  GLUCOSE 78 183*  BUN 27* 35*  CREATININE 1.07 1.11  CALCIUM 8.7 9.1   PT/INR No results found for this basename: LABPROT:2,INR:2 in the last 72 hours CMP     Component Value Date/Time   NA 139 09/27/2012 0626   K 4.0 09/27/2012 0626   CL 104 09/27/2012 0626   CO2 28 09/27/2012 0626   GLUCOSE 78 09/27/2012 0626   BUN 27* 09/27/2012 0626   CREATININE 1.07 09/27/2012 0626   CALCIUM 8.7 09/27/2012 0626   PROT 6.3 09/26/2012 1205   ALBUMIN 2.8* 09/26/2012 1205   AST 12 09/26/2012 1205   ALT 27 09/26/2012 1205   ALKPHOS 73 09/26/2012 1205   BILITOT 0.2* 09/26/2012 1205   GFRNONAA 72* 09/27/2012 0626   GFRAA 84* 09/27/2012 0626   Lipase     Component Value Date/Time   LIPASE 18 05/09/2012 1428       Studies/Results: No results found.  Anti-infectives: Anti-infectives      Start     Dose/Rate Route Frequency Ordered Stop   09/26/12 2000   ciprofloxacin (CIPRO) IVPB 400 mg        400 mg 200 mL/hr over 60 Minutes Intravenous Every 12 hours 09/26/12 1140     09/26/12 1200   metroNIDAZOLE (FLAGYL) IVPB 500 mg        500 mg 100 mL/hr over 60 Minutes Intravenous Every 8 hours 09/26/12 1037     09/26/12 1000   ciprofloxacin (CIPRO) IVPB 400 mg  Status:  Discontinued        400 mg 200 mL/hr over 60 Minutes Intravenous Every 12 hours 09/26/12 0912 09/26/12 1140   09/26/12 0400   metroNIDAZOLE (FLAGYL) IVPB 500 mg        500 mg 100 mL/hr over 60 Minutes Intravenous  Once 09/26/12 0355 09/26/12 0522   09/26/12 0400   ciprofloxacin (CIPRO) IVPB 200 mg        200 mg 100 mL/hr over 60 Minutes Intravenous  Once 09/26/12 0355 09/26/12 0648           Assessment/Plan  61 y/o male with Enteritis, leukocytosis, and multiple medical conditions (ARF, COPD, CAD, HTN, HLD, OSA, h/o lung cancer) 1.  Patients pain is resolved and tolerating diet well, will progress to carb mod diet 2.  Cont ambulation and IS 3.  No surgery is indicated in the patient so we will sign off and can be contacted with questions, the patient does not need a follow up appt with general surgery unless new consult becomes indicated    LOS: 3 days    DORT, [facility] 09/29/2012, 9:39 AM Pager: [phone]

## 2012-09-29 NOTE — Progress Notes (Signed)
[name] to be D/C'd Home per MD order.  Discharge instructions reviewed and discussed with the patient, all questions and concerns answered. Copy of instructions and scripts given to patient. Patient Demonstrated with Teach back method.   [name]  Home Medication Instructions AVW:[id number]   Printed on:09/29/12 1920  Medication Information                    tiotropium (SPIRIVA) 18 MCG inhalation capsule Place 18 mcg into inhaler and inhale daily.             FLUoxetine (PROZAC) 40 MG capsule Take 40 mg by mouth daily.             traZODone (DESYREL) 100 MG tablet Take 100 mg by mouth at bedtime.             insulin glargine (LANTUS) 100 UNIT/ML injection Inject 80 Units into the skin every morning.            aspirin EC 81 MG tablet Take 81 mg by mouth daily.           Multiple Vitamin (MULTIVITAMIN WITH MINERALS) TABS Take 1 tablet by mouth daily.           levalbuterol (XOPENEX) 0.63 MG/3ML nebulizer solution Take 1 ampule by nebulization every 4 (four) hours as needed. For shortness of breath           thiamine (VITAMIN B-1) 100 MG tablet Take 100 mg by mouth daily.           pantoprazole (PROTONIX) 40 MG tablet Take 40 mg by mouth daily.           atorvastatin (LIPITOR) 20 MG tablet Take 20 mg by mouth daily.           acetaminophen (TYLENOL) 500 MG tablet Take 1,000 mg by mouth every 6 (six) hours as needed. For pain           Phenyleph-CPM-DM-APAP (ALKA-SELTZER PLUS COLD & FLU PO) Take 1 packet by mouth daily as needed. For cold/flu symptoms (cough, congestion, etc.)           calcium carbonate (OS-CAL - DOSED IN MG OF ELEMENTAL CALCIUM) 1250 MG tablet Take 1 tablet by mouth daily.           diltiazem (CARDIZEM CD) 120 MG 24 hr capsule Take 120 mg by mouth daily.           gabapentin (NEURONTIN) 300 MG capsule Take 300 mg by mouth 3 (three) times daily.           albuterol (PROVENTIL) (2.5 MG/3ML) 0.083% nebulizer solution Take 3 mLs  (2.5 mg total) by nebulization every 4 (four) hours as needed. For shortness of breath           oxyCODONE (OXY IR/ROXICODONE) 5 MG immediate release tablet Take 5 mg by mouth every 8 (eight) hours as needed. For pain           Magnesium 250 MG TABS Take 500 mg by mouth 4 (four) times daily.           insulin aspart (NOVOLOG) 100 UNIT/ML injection Inject 10-25 Units into the skin 3 (three) times daily before meals. CBG < 70: implement hypoglycemia protocol CBG 70 - 120: 0 units CBG 121 - 150: 5 units CBG 151 - 200: 10 units CBG 201 - 250: 15 units CBG 251 - 300: 20 units CBG 301 - 350: 25 units CBG 351 -  400: 30 units           predniSONE (DELTASONE) 5 MG tablet Take 1-6 tablets (5-30 mg total) by mouth daily. Take 4 tabs for 2 days, 3 tabs for 3 days, 2 tabs for 3 days, and then 1 tab for 4 days and stop           ciprofloxacin (CIPRO) 500 MG tablet Take 1 tablet (500 mg total) by mouth 2 (two) times daily.           metroNIDAZOLE (FLAGYL) 500 MG tablet Take 1 tablet (500 mg total) by mouth 3 (three) times daily.           beclomethasone (QVAR) 80 MCG/ACT inhaler Inhale 2 puffs into the lungs 2 (two) times daily. For shortness of breath             IV site discontinued and catheter remains intact. Site without signs and symptoms of complications. Dressing and pressure applied.  Patient escorted to car by NT in a wheelchair,  no distress noted upon discharge.  [name] 09/29/2012 7:20 PM

## 2012-10-03 LAB — STOOL CULTURE

## 2012-10-07 ENCOUNTER — Encounter: Payer: Self-pay | Admitting: Surgery

## 2012-10-07 ENCOUNTER — Ambulatory Visit (INDEPENDENT_AMBULATORY_CARE_PROVIDER_SITE_OTHER): Payer: Managed Care, Other (non HMO) | Admitting: Surgery

## 2012-10-07 ENCOUNTER — Ambulatory Visit
Admission: RE | Admit: 2012-10-07 | Discharge: 2012-10-07 | Disposition: A | Discharge status: Home / Self Care | Payer: Managed Care, Other (non HMO) | Source: Ambulatory Visit | Attending: Surgery | Admitting: Surgery

## 2012-10-07 VITALS — BP 149/69 | HR 100 | Resp 20 | Ht 71.0 in | Wt 290.0 lb

## 2012-10-07 DIAGNOSIS — C3490 Malignant neoplasm of unspecified part of unspecified bronchus or lung: Secondary | ICD-10-CM

## 2012-10-07 DIAGNOSIS — Z09 Encounter for follow-up examination after completed treatment for conditions other than malignant neoplasm: Secondary | ICD-10-CM

## 2012-10-07 NOTE — Progress Notes (Signed)
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 According to the patient and his medical record he was admitted to hospital for a few days last month for hypoxemia and acute respiratory failure felt to be secondary to tracheobronchitis of viral origin. He said he recovered and went home but a few days later began having blood per rectum and was readmitted to the hospital for 3 or 4 days. It was initially felt that he might have ischemic colitis but this was apparently ruled out and he was diagnosed with colitis possibly of viral origin. Since his most recent discharge he said that his abdominal pain and blood per rectum have resolved. His breathing is back to baseline. His main complaint is that he has developed swelling in his lower legs and feet which he has never had before.   Current Outpatient Prescriptions  Medication Sig Dispense Refill  . acetaminophen (TYLENOL) 500 MG tablet Take 1,000 mg by mouth every 6 (six) hours as needed. For pain      . albuterol (PROVENTIL) (2.5 MG/3ML) 0.083% nebulizer solution Take 3 mLs (2.5 mg total) by nebulization every 4 (four) hours as needed. For shortness of breath  75 mL  10  . aspirin EC 81 MG tablet Take 81 mg by mouth daily.      Marland Kitchen atorvastatin (LIPITOR) 20 MG tablet Take 20 mg by mouth daily.      . beclomethasone (QVAR) 80 MCG/ACT inhaler Inhale 2 puffs into the lungs 2 (two) times daily. For shortness of breath  1 Inhaler    . calcium carbonate (OS-CAL - DOSED IN MG OF ELEMENTAL CALCIUM) 1250 MG tablet Take 1 tablet by mouth daily.      . ciprofloxacin (CIPRO) 500 MG tablet Take 1 tablet (500 mg total) by mouth 2 (two) times daily.  20 tablet  0  . diltiazem (CARDIZEM CD) 120 MG 24 hr  capsule Take 120 mg by mouth daily.      Marland Kitchen FLUoxetine (PROZAC) 40 MG capsule Take 40 mg by mouth daily.        Marland Kitchen gabapentin (NEURONTIN) 300 MG capsule Take 300 mg by mouth 3 (three) times daily.      . insulin aspart (NOVOLOG) 100 UNIT/ML injection Inject 10-25 Units into the skin 3 (three) times daily before meals. CBG < 70: implement hypoglycemia protocol CBG 70 - 120: 0 units CBG 121 - 150: 5 units CBG 151 - 200: 10 units CBG 201 - 250: 15 units CBG 251 - 300: 20 units CBG 301 - 350: 25 units CBG 351 - 400: 30 units  1 vial    . insulin glargine (LANTUS) 100 UNIT/ML injection Inject 80 Units into the skin every morning.       . levalbuterol (XOPENEX) 0.63 MG/3ML nebulizer solution Take 1 ampule by nebulization every 4 (four) hours as needed. For shortness of breath      . Magnesium 250 MG TABS Take 500 mg by mouth 4 (four) times daily.      . metroNIDAZOLE (FLAGYL) 500 MG tablet Take 1  tablet (500 mg total) by mouth 3 (three) times daily.  30 tablet  0  . Multiple Vitamin (MULTIVITAMIN WITH MINERALS) TABS Take 1 tablet by mouth daily.      Marland Kitchen oxyCODONE (OXY IR/ROXICODONE) 5 MG immediate release tablet Take 5 mg by mouth every 8 (eight) hours as needed. For pain      . pantoprazole (PROTONIX) 40 MG tablet Take 40 mg by mouth daily.      Marland Kitchen Phenyleph-CPM-DM-APAP (ALKA-SELTZER PLUS COLD & FLU PO) Take 1 packet by mouth daily as needed. For cold/flu symptoms (cough, congestion, etc.)      . predniSONE (DELTASONE) 5 MG tablet Take 1-6 tablets (5-30 mg total) by mouth daily. Take 4 tabs for 2 days, 3 tabs for 3 days, 2 tabs for 3 days, and then 1 tab for 4 days and stop      . thiamine (VITAMIN B-1) 100 MG tablet Take 100 mg by mouth daily.      Marland Kitchen tiotropium (SPIRIVA) 18 MCG inhalation capsule Place 18 mcg into inhaler and inhale daily.        . traZODone (DESYREL) 100 MG tablet Take 100 mg by mouth at bedtime.           Physical Exam: BP 149/69  Pulse 100  Resp 20  Ht 5\' 11"  (1.803 m)  Wt  290 lb (131.543 kg)  BMI 40.45 kg/m2  SpO2 93% He looks well. There is no cervical or supraclavicular adenopathy. Lung exam reveals slight wheeze over the right upper lobe. The lungs are otherwise clear. The right thoracotomy incision is well-healed and there are no skin lesions. Cardiac exam shows regular rate and rhythm with normal heart sounds. There is moderate bilateral lower leg and ankle edema.  Diagnostic Tests:  *RADIOLOGY REPORT*   Clinical Data: Right upper lobectomy for resection of right upper lobe squamous cell carcinoma, follow-up   CT CHEST WITHOUT CONTRAST   Technique:  Multidetector CT imaging of the chest was performed following the standard protocol without IV contrast.   Comparison: Chest x-ray of 09/23/2012 and CT chest of 02/27/2037   Findings: The patient has undergone right upper lobectomy for resection of the cavitary thick-walled lesion in the right upper lobe.  Postoperative changes in the right hilum are noted. However, there is scattered ground-glass opacities throughout the lungs particularly in the right lower lobe superior segment.  These ground-glass opacities are most consistent with a viral or atypical infectious process.  This appearance has progressed since the prior CT scan of 02/28/2012.  No new lung nodule is seen and no effusion is noted.  There is no evidence of pleural effusion.  The airway is patent.   On soft tissue window images, a few of the mediastinal lymph nodes appear more prominent.  A node in the right paratracheal region now has a short-axis diameter of 13 mm compared to 11 mm previously.  A left mediastinal node at the level of the carina has a short-axis diameter of 15 mm compared to 12 mm previously.  This change could be due to either an infectious process or possibly metastatic involvement.  Coronary artery calcifications are noted.  No abnormality of the upper abdomen is seen on this unenhanced exam. There are  diffuse degenerative changes throughout the thoracic spine.   IMPRESSION:   1.  Interval resection of right upper lobe lesion with right upper lobectomy and postoperative change. 2.  Scattered patchy ground-glass opacities throughout the lungs most consistent with viral or an  atypical infectious process which has progressed since the prior CT.  No lung metastases are evident. 3.  There are more prominent mediastinal lymph nodes worrisome for metastatic involvement.     Original Report Authenticated By: Dwyane Dee, M.D.    Impression:  There is no evidence of recurrent disease in the lungs. The groundglass opacities seen bilaterally are most likely of viral origin and related to his recent pulmonary condition a few weeks ago. The prominent mediastinal lymphadenopathy may also be related to respiratory infection.  Plan:  I will plan to see him back in 6 months and we'll repeat his CT scan of the chest. He said he does have a followup appointment with his primary physician in about a week concerning his lower extremity edema.

## 2012-12-08 LAB — HM DIABETES FOOT EXAM

## 2012-12-12 ENCOUNTER — Inpatient Hospital Stay (HOSPITAL_COMMUNITY)
Admission: EM | Admit: 2012-12-12 | Discharge: 2012-12-16 | DRG: 189 | Disposition: A | Discharge status: Home / Self Care | Payer: Managed Care, Other (non HMO) | Attending: Family Medicine | Admitting: Family Medicine

## 2012-12-12 ENCOUNTER — Encounter (HOSPITAL_COMMUNITY): Payer: Self-pay | Admitting: *Deleted

## 2012-12-12 ENCOUNTER — Emergency Department (HOSPITAL_COMMUNITY): Payer: Managed Care, Other (non HMO)

## 2012-12-12 DIAGNOSIS — G4730 Sleep apnea, unspecified: Secondary | ICD-10-CM

## 2012-12-12 DIAGNOSIS — Z902 Acquired absence of lung [part of]: Secondary | ICD-10-CM

## 2012-12-12 DIAGNOSIS — R42 Dizziness and giddiness: Secondary | ICD-10-CM

## 2012-12-12 DIAGNOSIS — G609 Hereditary and idiopathic neuropathy, unspecified: Secondary | ICD-10-CM | POA: Diagnosis present

## 2012-12-12 DIAGNOSIS — I2510 Atherosclerotic heart disease of native coronary artery without angina pectoris: Secondary | ICD-10-CM | POA: Diagnosis present

## 2012-12-12 DIAGNOSIS — Z01811 Encounter for preprocedural respiratory examination: Secondary | ICD-10-CM

## 2012-12-12 DIAGNOSIS — R0789 Other chest pain: Secondary | ICD-10-CM

## 2012-12-12 DIAGNOSIS — D649 Anemia, unspecified: Secondary | ICD-10-CM

## 2012-12-12 DIAGNOSIS — R0602 Shortness of breath: Secondary | ICD-10-CM

## 2012-12-12 DIAGNOSIS — N179 Acute kidney failure, unspecified: Secondary | ICD-10-CM

## 2012-12-12 DIAGNOSIS — G8929 Other chronic pain: Secondary | ICD-10-CM | POA: Diagnosis present

## 2012-12-12 DIAGNOSIS — K529 Noninfective gastroenteritis and colitis, unspecified: Secondary | ICD-10-CM

## 2012-12-12 DIAGNOSIS — J180 Bronchopneumonia, unspecified organism: Secondary | ICD-10-CM

## 2012-12-12 DIAGNOSIS — G4733 Obstructive sleep apnea (adult) (pediatric): Secondary | ICD-10-CM

## 2012-12-12 DIAGNOSIS — J9601 Acute respiratory failure with hypoxia: Secondary | ICD-10-CM | POA: Diagnosis present

## 2012-12-12 DIAGNOSIS — K219 Gastro-esophageal reflux disease without esophagitis: Secondary | ICD-10-CM

## 2012-12-12 DIAGNOSIS — C3490 Malignant neoplasm of unspecified part of unspecified bronchus or lung: Secondary | ICD-10-CM

## 2012-12-12 DIAGNOSIS — J441 Chronic obstructive pulmonary disease with (acute) exacerbation: Secondary | ICD-10-CM

## 2012-12-12 DIAGNOSIS — F32A Depression, unspecified: Secondary | ICD-10-CM

## 2012-12-12 DIAGNOSIS — Z79899 Other long term (current) drug therapy: Secondary | ICD-10-CM

## 2012-12-12 DIAGNOSIS — T7840XA Allergy, unspecified, initial encounter: Secondary | ICD-10-CM

## 2012-12-12 DIAGNOSIS — R000 Tachycardia, unspecified: Secondary | ICD-10-CM

## 2012-12-12 DIAGNOSIS — C3411 Malignant neoplasm of upper lobe, right bronchus or lung: Secondary | ICD-10-CM | POA: Diagnosis present

## 2012-12-12 DIAGNOSIS — M19049 Primary osteoarthritis, unspecified hand: Secondary | ICD-10-CM | POA: Diagnosis present

## 2012-12-12 DIAGNOSIS — H409 Unspecified glaucoma: Secondary | ICD-10-CM | POA: Diagnosis present

## 2012-12-12 DIAGNOSIS — F3289 Other specified depressive episodes: Secondary | ICD-10-CM | POA: Diagnosis present

## 2012-12-12 DIAGNOSIS — R1013 Epigastric pain: Secondary | ICD-10-CM

## 2012-12-12 DIAGNOSIS — K449 Diaphragmatic hernia without obstruction or gangrene: Secondary | ICD-10-CM | POA: Diagnosis present

## 2012-12-12 DIAGNOSIS — IMO0001 Reserved for inherently not codable concepts without codable children: Secondary | ICD-10-CM | POA: Diagnosis present

## 2012-12-12 DIAGNOSIS — G4700 Insomnia, unspecified: Secondary | ICD-10-CM | POA: Diagnosis present

## 2012-12-12 DIAGNOSIS — J9600 Acute respiratory failure, unspecified whether with hypoxia or hypercapnia: Principal | ICD-10-CM | POA: Diagnosis present

## 2012-12-12 DIAGNOSIS — R109 Unspecified abdominal pain: Secondary | ICD-10-CM

## 2012-12-12 DIAGNOSIS — IMO0002 Reserved for concepts with insufficient information to code with codable children: Secondary | ICD-10-CM

## 2012-12-12 DIAGNOSIS — G5793 Unspecified mononeuropathy of bilateral lower limbs: Secondary | ICD-10-CM

## 2012-12-12 DIAGNOSIS — I209 Angina pectoris, unspecified: Secondary | ICD-10-CM

## 2012-12-12 DIAGNOSIS — D360 Benign neoplasm of lymph nodes: Secondary | ICD-10-CM

## 2012-12-12 DIAGNOSIS — I129 Hypertensive chronic kidney disease with stage 1 through stage 4 chronic kidney disease, or unspecified chronic kidney disease: Secondary | ICD-10-CM | POA: Diagnosis present

## 2012-12-12 DIAGNOSIS — R112 Nausea with vomiting, unspecified: Secondary | ICD-10-CM

## 2012-12-12 DIAGNOSIS — F17200 Nicotine dependence, unspecified, uncomplicated: Secondary | ICD-10-CM

## 2012-12-12 DIAGNOSIS — I10 Essential (primary) hypertension: Secondary | ICD-10-CM

## 2012-12-12 DIAGNOSIS — J189 Pneumonia, unspecified organism: Secondary | ICD-10-CM

## 2012-12-12 DIAGNOSIS — J069 Acute upper respiratory infection, unspecified: Secondary | ICD-10-CM

## 2012-12-12 DIAGNOSIS — D72829 Elevated white blood cell count, unspecified: Secondary | ICD-10-CM

## 2012-12-12 DIAGNOSIS — K5730 Diverticulosis of large intestine without perforation or abscess without bleeding: Secondary | ICD-10-CM | POA: Diagnosis present

## 2012-12-12 DIAGNOSIS — J4489 Other specified chronic obstructive pulmonary disease: Secondary | ICD-10-CM

## 2012-12-12 DIAGNOSIS — R197 Diarrhea, unspecified: Secondary | ICD-10-CM

## 2012-12-12 DIAGNOSIS — F329 Major depressive disorder, single episode, unspecified: Secondary | ICD-10-CM

## 2012-12-12 DIAGNOSIS — E869 Volume depletion, unspecified: Secondary | ICD-10-CM

## 2012-12-12 DIAGNOSIS — J449 Chronic obstructive pulmonary disease, unspecified: Secondary | ICD-10-CM

## 2012-12-12 DIAGNOSIS — Z85118 Personal history of other malignant neoplasm of bronchus and lung: Secondary | ICD-10-CM

## 2012-12-12 DIAGNOSIS — Z9889 Other specified postprocedural states: Secondary | ICD-10-CM

## 2012-12-12 DIAGNOSIS — N189 Chronic kidney disease, unspecified: Secondary | ICD-10-CM

## 2012-12-12 DIAGNOSIS — Z9989 Dependence on other enabling machines and devices: Secondary | ICD-10-CM | POA: Diagnosis present

## 2012-12-12 DIAGNOSIS — Z794 Long term (current) use of insulin: Secondary | ICD-10-CM

## 2012-12-12 DIAGNOSIS — E876 Hypokalemia: Secondary | ICD-10-CM

## 2012-12-12 DIAGNOSIS — I252 Old myocardial infarction: Secondary | ICD-10-CM

## 2012-12-12 DIAGNOSIS — E119 Type 2 diabetes mellitus without complications: Secondary | ICD-10-CM | POA: Diagnosis present

## 2012-12-12 LAB — CBC WITH DIFFERENTIAL/PLATELET
Basophils Absolute: 0 10*3/uL (ref 0.0–0.1)
Eosinophils Absolute: 0 10*3/uL (ref 0.0–0.7)
Eosinophils Relative: 0 % (ref 0–5)
HCT: 33.2 % — ABNORMAL LOW (ref 39.0–52.0)
Lymphocytes Relative: 3 % — ABNORMAL LOW (ref 12–46)
MCH: 30.5 pg (ref 26.0–34.0)
MCHC: 35.5 g/dL (ref 30.0–36.0)
MCV: 85.8 fL (ref 78.0–100.0)
Monocytes Absolute: 0.6 10*3/uL (ref 0.1–1.0)
Platelets: 334 10*3/uL (ref 150–400)
RDW: 14.8 % (ref 11.5–15.5)

## 2012-12-12 LAB — CREATININE, SERUM
Creatinine, Ser: 1.41 mg/dL — ABNORMAL HIGH (ref 0.50–1.35)
GFR calc Af Amer: 60 mL/min — ABNORMAL LOW (ref >90)
GFR calc non Af Amer: 52 mL/min — ABNORMAL LOW (ref >90)

## 2012-12-12 LAB — CBC
MCV: 87 fL (ref 78.0–100.0)
Platelets: 328 10*3/uL (ref 150–400)
RBC: 3.85 MIL/uL — ABNORMAL LOW (ref 4.22–5.81)
WBC: 23.3 10*3/uL — ABNORMAL HIGH (ref 4.0–10.5)

## 2012-12-12 LAB — COMPREHENSIVE METABOLIC PANEL
ALT: 17 U/L (ref 0–53)
AST: 21 U/L (ref 0–37)
CO2: 23 mEq/L (ref 19–32)
Calcium: 6 mg/dL — CL (ref 8.4–10.5)
Creatinine, Ser: 1.32 mg/dL (ref 0.50–1.35)
GFR calc Af Amer: 65 mL/min — ABNORMAL LOW (ref >90)
GFR calc non Af Amer: 56 mL/min — ABNORMAL LOW (ref >90)
Sodium: 136 mEq/L (ref 135–145)
Total Protein: 6.8 g/dL (ref 6.0–8.3)

## 2012-12-12 LAB — PROTIME-INR
INR: 1.23 (ref 0.00–1.49)
Prothrombin Time: 15.3 seconds — ABNORMAL HIGH (ref 11.6–15.2)

## 2012-12-12 LAB — PRO B NATRIURETIC PEPTIDE: Pro B Natriuretic peptide (BNP): 787.2 pg/mL — ABNORMAL HIGH (ref 0–125)

## 2012-12-12 MED ORDER — ALBUTEROL SULFATE (5 MG/ML) 0.5% IN NEBU
5.0000 mg | INHALATION_SOLUTION | RESPIRATORY_TRACT | Status: DC
  Administered 2012-12-12 – 2012-12-13 (×4): 5 mg via RESPIRATORY_TRACT
  Filled 2012-12-12: qty 0.5
  Filled 2012-12-12 (×3): qty 1

## 2012-12-12 MED ORDER — SODIUM CHLORIDE 0.9 % IV SOLN
INTRAVENOUS | Status: DC
  Administered 2012-12-12: 22:00:00 via INTRAVENOUS

## 2012-12-12 MED ORDER — VANCOMYCIN HCL 10 G IV SOLR
1250.0000 mg | Freq: Two times a day (BID) | INTRAVENOUS | Status: DC
  Administered 2012-12-13 – 2012-12-14 (×3): 1250 mg via INTRAVENOUS
  Filled 2012-12-12 (×4): qty 1250

## 2012-12-12 MED ORDER — OXYCODONE HCL 5 MG PO TABS
5.0000 mg | ORAL_TABLET | Freq: Three times a day (TID) | ORAL | Status: DC | PRN
  Administered 2012-12-13 – 2012-12-14 (×3): 5 mg via ORAL
  Filled 2012-12-12 (×3): qty 1

## 2012-12-12 MED ORDER — HEPARIN SODIUM (PORCINE) 5000 UNIT/ML IJ SOLN
5000.0000 [IU] | Freq: Three times a day (TID) | INTRAMUSCULAR | Status: DC
  Administered 2012-12-12 – 2012-12-16 (×11): 5000 [IU] via SUBCUTANEOUS
  Filled 2012-12-12 (×14): qty 1

## 2012-12-12 MED ORDER — DILTIAZEM HCL ER COATED BEADS 120 MG PO CP24
120.0000 mg | ORAL_CAPSULE | Freq: Every day | ORAL | Status: DC
  Administered 2012-12-13 – 2012-12-16 (×4): 120 mg via ORAL
  Filled 2012-12-12 (×4): qty 1

## 2012-12-12 MED ORDER — LEVOFLOXACIN IN D5W 750 MG/150ML IV SOLN
750.0000 mg | INTRAVENOUS | Status: DC
  Administered 2012-12-13 (×2): 750 mg via INTRAVENOUS
  Filled 2012-12-12 (×3): qty 150

## 2012-12-12 MED ORDER — DEXTROSE 5 % IV SOLN
1.0000 g | Freq: Three times a day (TID) | INTRAVENOUS | Status: DC
  Administered 2012-12-12 – 2012-12-14 (×5): 1 g via INTRAVENOUS
  Filled 2012-12-12 (×7): qty 1

## 2012-12-12 MED ORDER — POTASSIUM CHLORIDE CRYS ER 20 MEQ PO TBCR
40.0000 meq | EXTENDED_RELEASE_TABLET | Freq: Two times a day (BID) | ORAL | Status: DC
  Administered 2012-12-12 – 2012-12-13 (×2): 40 meq via ORAL
  Filled 2012-12-12 (×3): qty 2

## 2012-12-12 MED ORDER — NICOTINE 21 MG/24HR TD PT24
21.0000 mg | MEDICATED_PATCH | Freq: Every day | TRANSDERMAL | Status: DC
  Administered 2012-12-12 – 2012-12-16 (×5): 21 mg via TRANSDERMAL
  Filled 2012-12-12 (×5): qty 1

## 2012-12-12 MED ORDER — INSULIN GLARGINE 100 UNIT/ML ~~LOC~~ SOLN
60.0000 [IU] | Freq: Every morning | SUBCUTANEOUS | Status: DC
  Administered 2012-12-13: 60 [IU] via SUBCUTANEOUS

## 2012-12-12 MED ORDER — GABAPENTIN 300 MG PO CAPS
300.0000 mg | ORAL_CAPSULE | Freq: Three times a day (TID) | ORAL | Status: DC
  Administered 2012-12-12 – 2012-12-16 (×11): 300 mg via ORAL
  Filled 2012-12-12 (×14): qty 1

## 2012-12-12 MED ORDER — FLUOXETINE HCL 40 MG PO CAPS: 40.0000 mg | ORAL_CAPSULE | Freq: Every day | ORAL | Status: DC

## 2012-12-12 MED ORDER — VANCOMYCIN HCL 10 G IV SOLR
2500.0000 mg | Freq: Once | INTRAVENOUS | Status: AC
  Administered 2012-12-13: 2500 mg via INTRAVENOUS
  Filled 2012-12-12: qty 2500

## 2012-12-12 MED ORDER — TIOTROPIUM BROMIDE MONOHYDRATE 18 MCG IN CAPS
18.0000 ug | ORAL_CAPSULE | Freq: Every day | RESPIRATORY_TRACT | Status: DC
  Administered 2012-12-13 – 2012-12-16 (×4): 18 ug via RESPIRATORY_TRACT
  Filled 2012-12-12: qty 5

## 2012-12-12 MED ORDER — FLUTICASONE PROPIONATE HFA 44 MCG/ACT IN AERO
3.0000 | INHALATION_SPRAY | Freq: Two times a day (BID) | RESPIRATORY_TRACT | Status: DC
  Administered 2012-12-13 – 2012-12-16 (×7): 3 via RESPIRATORY_TRACT
  Filled 2012-12-12: qty 10.6

## 2012-12-12 MED ORDER — ALBUTEROL SULFATE (5 MG/ML) 0.5% IN NEBU
5.0000 mg | INHALATION_SOLUTION | Freq: Once | RESPIRATORY_TRACT | Status: AC
  Administered 2012-12-12: 5 mg via RESPIRATORY_TRACT
  Filled 2012-12-12: qty 1

## 2012-12-12 MED ORDER — BIOTENE DRY MOUTH MT LIQD
15.0000 mL | Freq: Two times a day (BID) | OROMUCOSAL | Status: DC
  Administered 2012-12-12 – 2012-12-16 (×8): 15 mL via OROMUCOSAL

## 2012-12-12 MED ORDER — POTASSIUM CHLORIDE 10 MEQ/100ML IV SOLN
10.0000 meq | INTRAVENOUS | Status: AC
  Administered 2012-12-12 (×4): 10 meq via INTRAVENOUS
  Filled 2012-12-12 (×5): qty 100

## 2012-12-12 MED ORDER — MAGNESIUM OXIDE 400 (241.3 MG) MG PO TABS
200.0000 mg | ORAL_TABLET | Freq: Four times a day (QID) | ORAL | Status: DC
  Administered 2012-12-12 – 2012-12-16 (×14): 200 mg via ORAL
  Filled 2012-12-12 (×19): qty 0.5

## 2012-12-12 MED ORDER — PANTOPRAZOLE SODIUM 40 MG PO TBEC
40.0000 mg | DELAYED_RELEASE_TABLET | Freq: Every day | ORAL | Status: DC
Start: 2012-12-13 — End: 2012-12-16
  Administered 2012-12-13 – 2012-12-15 (×3): 40 mg via ORAL
  Filled 2012-12-12 (×2): qty 1

## 2012-12-12 MED ORDER — METHYLPREDNISOLONE SODIUM SUCC 125 MG IJ SOLR
125.0000 mg | Freq: Four times a day (QID) | INTRAMUSCULAR | Status: DC
  Administered 2012-12-12 – 2012-12-13 (×3): 125 mg via INTRAVENOUS
  Filled 2012-12-12 (×6): qty 2

## 2012-12-12 MED ORDER — ADULT MULTIVITAMIN W/MINERALS CH
1.0000 | ORAL_TABLET | Freq: Every day | ORAL | Status: DC
  Administered 2012-12-13 – 2012-12-16 (×4): 1 via ORAL
  Filled 2012-12-12 (×4): qty 1

## 2012-12-12 MED ORDER — TRAZODONE HCL 100 MG PO TABS
100.0000 mg | ORAL_TABLET | Freq: Every day | ORAL | Status: DC
  Administered 2012-12-12 – 2012-12-15 (×4): 100 mg via ORAL
  Filled 2012-12-12 (×6): qty 1

## 2012-12-12 MED ORDER — METHYLPREDNISOLONE SODIUM SUCC 125 MG IJ SOLR
125.0000 mg | INTRAMUSCULAR | Status: AC
  Administered 2012-12-12: 125 mg via INTRAVENOUS
  Filled 2012-12-12: qty 2

## 2012-12-12 MED ORDER — ASPIRIN EC 81 MG PO TBEC
81.0000 mg | DELAYED_RELEASE_TABLET | Freq: Every day | ORAL | Status: DC
Start: 2012-12-13 — End: 2012-12-16
  Administered 2012-12-13 – 2012-12-16 (×4): 81 mg via ORAL
  Filled 2012-12-12 (×4): qty 1

## 2012-12-12 MED ORDER — FLUOXETINE HCL 20 MG PO CAPS
40.0000 mg | ORAL_CAPSULE | Freq: Every day | ORAL | Status: DC
  Administered 2012-12-13 – 2012-12-16 (×4): 40 mg via ORAL
  Filled 2012-12-12 (×4): qty 2

## 2012-12-12 MED ORDER — MAGNESIUM 250 MG PO TABS: 500.0000 mg | ORAL_TABLET | Freq: Four times a day (QID) | ORAL | Status: DC

## 2012-12-12 NOTE — ED Notes (Signed)
Pt her per [facility] complaining of dizziness.  Pt advises he has been dizzy x 2 days and getting worse.  Advises worse when he first stands.  Pt also, complains of respiratory distress more so than his normal COPD and Chest Pain 6/10.  [facility] attempted IV without success, 12 completed, and ASA 324 mg administered.

## 2012-12-12 NOTE — Progress Notes (Signed)
Admitted pt to rm [id number] from ED, alert and oriented, call bell placed within reach, oriented to room, orders carried out. Admission assessment done.  Filed Vitals:   12/12/12 2023  BP: 156/69  Pulse: 101  Temp: 99 F (37.2 C)  Resp: 22   [name], [address]

## 2012-12-12 NOTE — Progress Notes (Signed)
ANTIBIOTIC CONSULT NOTE - INITIAL  Pharmacy Consult for vancomycin, cefepime, levaquin Indication: HCAP  Allergies  Allergen Reactions  . Dilaudid (Hydromorphone Hcl)     Made him crazy    Patient Measurements: Height: 5\' 11"  (180.3 cm) Weight: 280 lb (127.007 kg) IBW/kg (Calculated) : 75.3    Vital Signs: Temp: 98.6 F (37 C) (02/07 1520) Temp src: Oral (02/07 1520) BP: 128/63 mmHg (02/07 1842) Pulse Rate: 99  (02/07 1842) Intake/Output from previous day:   Intake/Output from this shift:    Labs:  Basename 12/12/12 1538  WBC 23.0*  HGB 11.8*  PLT 334  LABCREA --  CREATININE 1.32   Estimated Creatinine Clearance: 78.8 ml/min (by C-G formula based on Cr of 1.32). No results found for this basename: VANCOTROUGH:2,VANCOPEAK:2,VANCORANDOM:2,GENTTROUGH:2,GENTPEAK:2,GENTRANDOM:2,TOBRATROUGH:2,TOBRAPEAK:2,TOBRARND:2,AMIKACINPEAK:2,AMIKACINTROU:2,AMIKACIN:2, in the last 72 hours   Microbiology: No results found for this or any previous visit (from the past 720 hour(s)).  Medical History: Past Medical History  Diagnosis Date  . Anemia   . Allergy   . Thyroid disease   . Parathyroid disease   . Headache   . Hypertension     takes  HYzaar daily  . Coronary artery disease     has 1 stent  . Myocardial infarct   . Asthma   . Emphysema     sees Dr.Ramaswami for this  . COPD (chronic obstructive pulmonary disease)     uses Albuterol and Spiriva daily  . Shortness of breath     with exertion   . Bronchitis   . Productive cough     white in color but no odor  . Sleep apnea     uses BiPaP  . Peripheral neuropathy   . Lung mass     right upper lobe  . Back pain     4 deteriorating disc and receives an injection q3-11mon;has been doing this for about 34yrs  . H/O hiatal hernia   . GERD (gastroesophageal reflux disease)     takes Prilosec daily  . Blood transfusion     as a child  . Glaucoma(365)     hx of  . Depression     takes Prozac daily  . Insomnia     takes Trazodone nightly  . Anginal pain   . Chronic kidney disease     acute kidney failure post surgery  . Pneumonia     hx of' 62 yo,rd' last time in 2012  . Type II diabetes mellitus     takes Metformin bid and Novolog and Lantus daily  . Arthritis     "hands"   Assessment: 62 year old male presents to [facility] with multiple med issues including dizziness that started a couple of days ago. History of Stage 1A NSCLC s/p lobectomy. No fevers noted in ED, WBC elevated at 23, patient has been on prednisone taper prior to admit. Orders to start empiric abx for HCAP coverage with vancomycin, levaquin, and cefepime. Renal function within normal limits.  Goal of Therapy:  Vancomycin trough level 15-20 mcg/ml Eradication of infection  Plan:  Measure antibiotic drug levels at steady state Follow up culture results Vancomycin 2500mg  IV x 1 now then 1250mg  IV q 12hours - will check trough over weekend Continue cefepime 1g q8 hours Continue levaquin 750mg  q24 hours  Severiano Gilbert 12/12/2012,7:06 PM

## 2012-12-12 NOTE — ED Notes (Signed)
Pt also complains of headache (10/10)

## 2012-12-12 NOTE — ED Provider Notes (Signed)
History     CSN: [id number]  Arrival date & time 12/12/12  1509   First MD Initiated Contact with Patient 12/12/12 1516      Chief Complaint  Patient presents with  . Dizziness    HPI  The patient presents with lightheadedness, dyspnea, chest pain.  Her symptoms began approximately 3 days ago.  After symptoms worsened for one day he had approximately 24 hours of associated nausea, vomiting, diarrhea, diaphoresis.  These gastrointestinal symptoms have resolved aside from mild persistent nausea, and he denies vomiting or diarrhea in the past 24 hours.  He continues to describe persistent lightheadedness/near syncope, dyspnea, anterior chest tightness.  Symptoms are worse with activity.  There is no pleuritic chest pain.  No clear alleviating factors. No fever, no chills. The patient was inconsistently compliant with medication prior to the last several days. He was started on prednisone 5 mg daily 3 days ago.  Past Medical History  Diagnosis Date  . Anemia   . Allergy   . Thyroid disease   . Parathyroid disease   . Headache   . Hypertension     takes  HYzaar daily  . Coronary artery disease     has 1 stent  . Myocardial infarct   . Asthma   . Emphysema     sees Dr.Ramaswami for this  . COPD (chronic obstructive pulmonary disease)     uses Albuterol and Spiriva daily  . Shortness of breath     with exertion   . Bronchitis   . Productive cough     white in color but no odor  . Sleep apnea     uses BiPaP  . Peripheral neuropathy   . Lung mass     right upper lobe  . Back pain     4 deteriorating disc and receives an injection q3-70mon;has been doing this for about 54yrs  . H/O hiatal hernia   . GERD (gastroesophageal reflux disease)     takes Prilosec daily  . Blood transfusion     as a child  . Glaucoma(365)     hx of  . Depression     takes Prozac daily  . Insomnia     takes Trazodone nightly  . Anginal pain   . Chronic kidney disease     acute kidney failure  post surgery  . Pneumonia     hx of' 62 yo,rd' last time in 2012  . Type II diabetes mellitus     takes Metformin bid and Novolog and Lantus daily  . Arthritis     "hands"    Past Surgical History  Procedure Date  . Carpal tunnel release     bilateral  . Rotator cuff repair     left  . Colonoscopy   . Esophagogastroduodenoscopy   . Eye surgery   . Lobectomy 03/17/2012    upper right side with resection  . Elbow surgery     ulnar nerve; left  . Cardiac catheterization 2006/2008/2009  . Coronary angioplasty with stent placement     1 stent  . Refractive surgery     bilaterally  . Inguinal hernia repair 1990's    double inguinal    Family History  Problem Relation Age of Onset  . COPD Mother   . Heart attack Father   . Anesthesia problems Neg Hx   . Hypotension Neg Hx   . Malignant hyperthermia Neg Hx   . Pseudochol deficiency Neg Hx     History  Substance Use Topics  . Smoking status: Current Some Day Smoker -- 1.0 packs/day for 45 years    Types: Cigarettes    Last Attempt to Quit: 03/26/2012  . Smokeless tobacco: Never Used     Comment: reports still smoking 3 cigs a day 05-01-12  . Alcohol Use: Yes     Comment: gallon per day liquor      Review of Systems  Constitutional:       Per HPI, otherwise negative  HENT:       Per HPI, otherwise negative  Eyes: Negative.   Respiratory:       Per HPI, otherwise negative  Cardiovascular:       Per HPI, otherwise negative  Gastrointestinal: Positive for nausea, vomiting and diarrhea. Negative for abdominal pain, blood in stool and anal bleeding.  Genitourinary: Negative.   Musculoskeletal:       Per HPI, otherwise negative  Skin: Negative for wound.  Neurological: Positive for light-headedness. Negative for dizziness, syncope, weakness and headaches.    Allergies  Dilaudid  Home Medications   Current Outpatient Rx  Name  Route  Sig  Dispense  Refill  . ACETAMINOPHEN 500 MG PO TABS   Oral   Take 1,000  mg by mouth every 6 (six) hours as needed. For pain         . ALBUTEROL SULFATE (2.5 MG/3ML) 0.083% IN NEBU   Nebulization   Take 3 mLs (2.5 mg total) by nebulization every 4 (four) hours as needed. For shortness of breath   75 mL   10   . ASPIRIN EC 81 MG PO TBEC   Oral   Take 81 mg by mouth daily.         . ATORVASTATIN CALCIUM 20 MG PO TABS   Oral   Take 20 mg by mouth daily.         . BECLOMETHASONE DIPROPIONATE 80 MCG/ACT IN AERS   Inhalation   Inhale 2 puffs into the lungs 2 (two) times daily. For shortness of breath   1 Inhaler      . CIPROFLOXACIN HCL 500 MG PO TABS   Oral   Take 1 tablet (500 mg total) by mouth 2 (two) times daily.   20 tablet   0   . DILTIAZEM HCL ER COATED BEADS 120 MG PO CP24   Oral   Take 120 mg by mouth daily.         Marland Kitchen FLUOXETINE HCL 40 MG PO CAPS   Oral   Take 40 mg by mouth daily.           Marland Kitchen GABAPENTIN 300 MG PO CAPS   Oral   Take 300 mg by mouth 3 (three) times daily.         . INSULIN ASPART 100 UNIT/ML [place] SOLN   Subcutaneous   Inject 10-25 Units into the skin 3 (three) times daily before meals. CBG < 70: implement hypoglycemia protocol CBG 70 - 120: 0 units CBG 121 - 150: 5 units CBG 151 - 200: 10 units CBG 201 - 250: 15 units CBG 251 - 300: 20 units CBG 301 - 350: 25 units CBG 351 - 400: 30 units   1 vial      . INSULIN GLARGINE 100 UNIT/ML [place] SOLN   Subcutaneous   Inject 80 Units into the skin every morning.          Marland Kitchen LEVALBUTEROL HCL 0.63 MG/3ML IN NEBU   Nebulization   Take  1 ampule by nebulization every 4 (four) hours as needed. For shortness of breath         . MAGNESIUM 250 MG PO TABS   Oral   Take 500 mg by mouth 4 (four) times daily.         Marland Kitchen METRONIDAZOLE 500 MG PO TABS   Oral   Take 1 tablet (500 mg total) by mouth 3 (three) times daily.   30 tablet   0   . ADULT MULTIVITAMIN W/MINERALS CH   Oral   Take 1 tablet by mouth daily.         . OXYCODONE HCL 5 MG PO TABS    Oral   Take 5 mg by mouth every 8 (eight) hours as needed. For pain         . PANTOPRAZOLE SODIUM 40 MG PO TBEC   Oral   Take 40 mg by mouth daily.         Marland Kitchen ALKA-SELTZER PLUS COLD & FLU PO   Oral   Take 1 packet by mouth daily as needed. For cold/flu symptoms (cough, congestion, etc.)         . VITAMIN B-1 100 MG PO TABS   Oral   Take 100 mg by mouth daily.         Marland Kitchen TIOTROPIUM BROMIDE MONOHYDRATE 18 MCG IN CAPS   Inhalation   Place 18 mcg into inhaler and inhale daily.           . TRAZODONE HCL 100 MG PO TABS   Oral   Take 100 mg by mouth at bedtime.             BP 139/71  Temp 98.6 F (37 C) (Oral)  Resp 18  Ht 5\' 11"  (1.803 m)  Wt 280 lb (127.007 kg)  BMI 39.05 kg/m2  SpO2 97%  Physical Exam  Nursing note and vitals reviewed. Constitutional: He is oriented to person, place, and time. He appears well-developed.       Obese, uncomfortable-appearing male  HENT:  Head: Normocephalic and atraumatic.  Eyes: Conjunctivae normal and EOM are normal.  Cardiovascular: Regular rhythm.  Tachycardia present.   Pulmonary/Chest: Effort normal. No stridor. No respiratory distress. He has decreased breath sounds.  Abdominal: He exhibits no distension.  Musculoskeletal: He exhibits no edema.  Neurological: He is alert and oriented to person, place, and time.  Skin: Skin is warm. He is diaphoretic.  Psychiatric: He has a normal mood and affect.    ED Course  Procedures (including critical care time)   Labs Reviewed  CBC WITH DIFFERENTIAL  COMPREHENSIVE METABOLIC PANEL  TROPONIN I  PROTIME-INR  PRO B NATRIURETIC PEPTIDE   No results found.   No diagnosis found.  Cardiac; 95 sinus, normal  O2- 99% [place], abnormal   Date: 12/12/2012  Rate: 90  Rhythm: normal sinus rhythm  QRS Axis: normal  Intervals: normal  ST/T Wave abnormalities: nonspecific T wave changes  Conduction Disutrbances:none  Narrative Interpretation:   Old EKG Reviewed: none  available ABNORMAL - significant artefact  5:23 PM Patient remained tachycardic, with a heart rate of greater than 100.  Initial labs are notable for hypokalemia, hypocalcemia, elevated BNP.  Patient's lung sounds are improved, though there is trace wheezing.  Additional albuterol order to, admission will be requested. MDM  The patient presented with ongoing dyspnea, associated other complaints.  On exam the patient is diaphoretic, but in no distress.  Given his multiple medical problems, and hypocalcemia, hypokalemia  identified on labs, he was admitted for further evaluation and management with presumed COPD exacerbation.  Gerhard Munch, MD 12/12/12 [phone]

## 2012-12-12 NOTE — H&P (Signed)
. [facility] History and Physical  [name] UJW:[id number] DOB: [birth year] DOA: 12/12/2012  Referring physician: Jeraldine Weaver EDP PCP: Leo Grosser, MD  Specialists: none  Chief Complaint: Dizzy  HPI: [name] is a 62 y.o. male with multiple med issues who presented to the ED 2.7.14-he states that this started a couple of days ago. Wheezing started 2.4.14. He felt as if he was coughing more and couldn't sleep-he states he is producing more sputum which is white and developed a fever-Not measured or recorded.   He states he has had some n/v and diarrhea as well-Diarrhea he had maybe 3-4 episodes-seemed losse and watery and greenish which stopped by itself 2.7.14 am-he states that he vomited multiple times on 2.5.13, which was post-tussive-he has had increasing wheezing and was started on prednisone from last hospital issues-was taking 5 tabs the first day in an attempt to help taper this which seemd not to help much. HE developed increasing wob and gasping with minimal activity.  Noted his pulse ox was 82% at home and low R of 31.  Usually uses O2 prn but wife forced him to put it on. Wife went into work and in the afternoon he fell, felt weaker and is unsure as to whether he had LOC at the time  He has had some CP on the L side of the chest-lasted for just a little while-states there is some radiation into arm-none into neck-states he had this 3-4 times prior to coming-the longest it lasted was about 5 min  In the ED, K=2.5, Bun/Creat=24/1.32 ProBNP was 787 Wbc-23, Hb 11.8, PLt 334 CXR=Cardiomegaly and chronic interstitial thickening. Favored to be  the sequelae of chronic bronchitis/smoking. Mild pulmonary venous  congestion is difficult to exclude.  Review of Systems: The patient denies dysuria, no blood in stool-vomit was post-tussive cont food.-he got dizzy this am and fell in living room this am and came to immediately. , no Periph edema/no abd swelling  Was  recently d/c off of Potassium and lasix by PCP about 1 mo ago. No wekaness on any one sid eof the body, no slurred speech   Past Medical History  Diagnosis Date  . Anemia   . Allergy   . Thyroid disease   . Parathyroid disease   . Headache   . Hypertension     takes  HYzaar daily  . Coronary artery disease     has 1 stent  . Myocardial infarct   . Asthma   . Emphysema     sees Dr.Ramaswami for this  . COPD (chronic obstructive pulmonary disease)     uses Albuterol and Spiriva daily  . Shortness of breath     with exertion   . Bronchitis   . Productive cough     white in color but no odor  . Sleep apnea     uses BiPaP  . Peripheral neuropathy   . Lung mass     right upper lobe  . Back pain     4 deteriorating disc and receives an injection q3-50mon;has been doing this for about 8yrs  . H/O hiatal hernia   . GERD (gastroesophageal reflux disease)     takes Prilosec daily  . Blood transfusion     as a child  . Glaucoma(365)     hx of  . Depression     takes Prozac daily  . Insomnia     takes Trazodone nightly  . Anginal pain   . Chronic kidney  disease     acute kidney failure post surgery  . Pneumonia     hx of' 62 yo,rd' last time in 2012  . Type II diabetes mellitus     takes Metformin bid and Novolog and Lantus daily  . Arthritis     "hands"    Admission 11.22.13-Diverticulitis/colitis-Rx cipro/flagyll Recent Office visit Dr. 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 - s/p lobectomy Hx of renal failure and persistent hypomagnesmia - onset May 2013 following lobectomy -> renal failure resolved - Oct 2013 - persistent hypomag being followed by Dr Lajuana Carry  status post right thoracotomy  with right upper lobectomy and mediastinal lymph node dissection on 03/17/2012 for a T1a, 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 LAD with mid- vessel calcifications, and a 50% lucent stenosis, circumflex segmental 50-60% mid stenosis, RCA is found with a patent distal stent. Moderate LAD and circumflex lesions, but these were IVUS'ed and no critical lesion was noted S/p DES stent to RCA in 2006  Admission for ETOH intox 09/05/2004  Past Surgical History  Procedure Date  . Carpal tunnel release     bilateral  . Rotator cuff repair     left  . Colonoscopy   . Esophagogastroduodenoscopy   . Eye surgery   . Lobectomy 03/17/2012    upper right side with resection  . Elbow surgery     ulnar nerve; left  . Cardiac catheterization 2006/2008/2009  . Coronary angioplasty with stent placement     1 stent  . Refractive surgery     bilaterally  . Inguinal hernia repair 1990's    double inguinal   Social History:  reports that he has been smoking Cigarettes.  He has a 45 pack-year smoking history. He has never used smokeless tobacco. He reports that he drinks alcohol. He reports that he uses illicit drugs (Cocaine). Lives at home with wife  Allergies  Allergen Reactions  . Dilaudid (Hydromorphone Hcl)     Made him crazy    Family History  Problem Relation Age of Onset  . COPD Mother   . Heart attack Father   . Anesthesia problems Neg Hx   . Hypotension Neg Hx   . Malignant hyperthermia Neg Hx   . Pseudochol deficiency Neg Hx     Prior to Admission medications   Medication Sig Start Date End Date Taking? Authorizing Provider  albuterol (PROVENTIL) (2.5 MG/3ML) 0.083% nebulizer solution  Take 3 mLs (2.5 mg total) by nebulization every 4 (four) hours as needed. For shortness of breath 09/24/12  Yes Richarda Overlie, MD  aspirin EC 81 MG tablet Take 81 mg by mouth daily.   Yes Historical Provider, MD  atorvastatin (LIPITOR) 20 MG tablet Take 20 mg by mouth daily.   Yes Historical Provider, MD  beclomethasone (QVAR) 80 MCG/ACT inhaler Inhale 2 puffs into the lungs 2 (two) times daily. For shortness of breath 09/29/12 09/29/13  Yes Renae Fickle, MD  Calcium Carbonate-Vitamin D (CALCIUM 600/VITAMIN D PO) Take 1 tablet by mouth daily.   Yes Historical Provider, MD  diltiazem (CARDIZEM CD) 120 MG 24 hr capsule Take 120 mg by mouth daily. 04/25/12 04/25/13 Yes William S Minor, NP  FLUoxetine (PROZAC) 40 MG capsule Take 40 mg by mouth daily.     Yes Historical Provider, MD  gabapentin (NEURONTIN) 300 MG capsule Take 300 mg by mouth 3 (three) times daily. 04/25/12 04/25/13 Yes William S Minor, NP  insulin glargine (LANTUS) 100 UNIT/ML injection Inject 60 Units into the skin every morning.  09/22/11  Yes Srikar Cherlynn Kaiser, MD  levalbuterol Pauline Aus) 0.63 MG/3ML nebulizer solution Take 1 ampule by nebulization every 4 (four) hours as needed. For shortness of breath 04/25/12 04/25/13 Yes Vilinda Blanks Minor, NP  Magnesium 250 MG TABS Take 500 mg by mouth 4 (four) times daily.   Yes Historical Provider, MD  Multiple Vitamin (MULTIVITAMIN WITH MINERALS) TABS Take 1 tablet by mouth daily.   Yes Historical Provider, MD  pantoprazole (PROTONIX) 40 MG tablet Take 40 mg by mouth daily.   Yes Historical Provider, MD  prednisoLONE 5 MG TABS Take 5-25 mg by mouth See admin instructions. Take 25 mg for 4 days, 20 mg for 4 days, 15 mg for 4 days, 10 mg for 4 days and 5 mg for 4 days Currently taking 15 mg for 4 days.   Yes Historical Provider, MD  thiamine (VITAMIN B-1) 100 MG tablet Take 100 mg by mouth daily.   Yes Historical Provider, MD  tiotropium (SPIRIVA) 18 MCG inhalation capsule Place 18 mcg into inhaler and  inhale daily.     Yes Historical Provider, MD  traZODone (DESYREL) 100 MG tablet Take 100 mg by mouth at bedtime.     Yes Historical Provider, MD  acetaminophen (TYLENOL) 500 MG tablet Take 1,000 mg by mouth every 6 (six) hours as needed. For pain    Historical Provider, MD  insulin aspart (NOVOLOG) 100 UNIT/ML injection Inject 10-25 Units into the skin 3 (three) times daily before meals. CBG < 70: implement hypoglycemia protocol CBG 70 - 120: 0 units CBG 121 - 150: 5 units CBG 151 - 200: 10 units CBG 201 - 250: 15 units CBG 251 - 300: 20 units CBG 301 - 350: 25 units CBG 351 - 400: 30 units 09/29/12   Renae Fickle, MD  oxyCODONE (OXY IR/ROXICODONE) 5 MG immediate release tablet Take 5 mg by mouth every 8 (eight) hours as needed. For pain    Historical Provider, MD   Physical Exam: Filed Vitals:   12/12/12 1520 12/12/12 1659 12/12/12 1717  BP: 139/71 135/61 140/76  Pulse:  106 99  Temp: 98.6 F (37 C)    TempSrc: Oral    Resp: 18 22 28   Height: 5\' 11"  (1.803 m)    Weight: 127.007 kg (280 lb)    SpO2: 97% 94% 93%     General:  Alert pleasant oriented pleasant Caucasian male no apparent distress-breathing slightly labored on nasal oxygen  Eyes: No pallor no icterus extraocular movements intact  ENT: Poor dentition upper mouth  Neck: No JVD no bruit  Cardiovascular: S1-S2 no murmur or gallop slightly tachycardic  Respiratory: Coarse breath sounds posterior basal lobe  Abdomen: Soft nontender nondistended  Skin: No edema rash  Musculoskeletal: Grossly intact movement of joints  Psychiatric: Thymic  Neurologic: All 4 limbs move equally 515 power sensation grossly intact smile symmetrical uvula midline vision but that  confrontation is normal reflexes are equivocal  Labs on Admission:  Basic Metabolic Panel:  Lab 12/12/12 1610  NA 136  K 2.5*  CL 93*  CO2 23  GLUCOSE 194*  BUN 24*  CREATININE 1.32  CALCIUM 6.0*  MG --  PHOS --   Liver Function  Tests:  Lab 12/12/12 1538  AST 21  ALT 17  ALKPHOS 85  BILITOT 0.4  PROT 6.8  ALBUMIN 2.6*   No results found for this basename: LIPASE:5,AMYLASE:5 in the last 168 hours No results found for this basename: AMMONIA:5 in the last 168 hours CBC:  Lab 12/12/12 1538  WBC 23.0*  NEUTROABS 21.7*  HGB 11.8*  HCT 33.2*  MCV 85.8  PLT 334   Cardiac Enzymes:  Lab 12/12/12 1543  CKTOTAL --  CKMB --  CKMBINDEX --  TROPONINI <0.30    BNP (last 3 results)  Basename 12/12/12 1543 04/23/12 1315 04/03/12 0550  PROBNP 787.2* 501.5* 368.7*   CBG: No results found for this basename: GLUCAP:5 in the last 168 hours  Radiological Exams on Admission: Dg Chest 2 View  12/12/2012  *RADIOLOGY REPORT*  Clinical Data: Chest pain and shortness of breath.  CHEST - 2 VIEW  Comparison: Plain film of 09/23/2012 and CT of 10/07/2012.  Findings: Midline trachea.  Mild cardiomegaly.  Right upper lobe scarring, likely postoperative. No pleural effusion or pneumothorax.  No lobar consolidation.  Diffuse interstitial thickening.  Slightly greater on the left than right.  Overall similar to 09/22/2012.  IMPRESSION: Cardiomegaly and chronic interstitial thickening.  Favored to be the sequelae of chronic bronchitis/smoking.  Mild pulmonary venous congestion is difficult to exclude.  Right-sided surgical changes.   Original Report Authenticated By: Jeronimo Greaves, M.D.     EKG: Independently reviewed. Sinus rhythm artifact PR interval 0.082 0.12 QRS axis is 50 no ST-T wave elevations  Assessment/Plan Principal Problem:  *Acute respiratory failure with hypoxia Active Problems:  C O P D  Non-small cell carcinoma of lung, stage 1  Hypomagnesemia-chronic  OSA on CPAP  Chronic kidney disease  Pneumonia  Type II diabetes mellitus  Anginal pain   1. Acute respiratory failure with hypoxia-patient's sats were noted to be in 80s at home. Patient will be monitored given nebulizations kept on home O2. 2. Elevated  BNP-unclear significance. His issues are more respiratory and he does not appear volume overloaded. 3. Likely viral bronchitis versus pneumonia, complicating chest x-ray unreliable the patient has postop changes. History clinically sounds as if he had diarrheal illness with coughing raising suspicion more of a viral infection. I will get an adenovirus PCR, we'll get Legionella as well as strep pneumo urinary antigens. Expectorated sputum placed on healthcare associated pneumonia coverage at least overnight. He has been taking steroids at home from prior hospital visit and this may account for his leukocytosis without elevation of temperature 4. AECOPD-continue Spiriva 18 mcg daily, albuterol 5 mg every 4 hourly, Flovent 3 puffs twice a day, we will start stress dose  Solu-Medrol 125 mg q6 hourly and then slow extended taper to oral prednisone 5. 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 Laneta Simmers 10/07/2012 there was no recurrence of disease in lungs. 6. CAD-history stenting in 2006-patient to continue chronic meds including aspirin 81 mg-ideally should be on an ACE inhibitor-P. blocker relatively contraindicated given significant pulmonary disease 7. Continued smoking habit-patient will get nicotine patch 21 mcg he was  strongly encouraged to desist-he realizes that this is life-threatening he continues 8. Hypertension-continue Cardizem 120 mg daily-moderately controlled present 9. Chronic pain-continue oxycodone immediate release 5 mg daily when necessary, gabapentin 300 3 times a day 10. Depression-continue Prozac 40 mg daily 11. History of diverticulitis 09/2012-monitor closely. No indication at present for imaging given diarrhea has stopped. 12. History of chronic hypomagnesemia-is followed in the outpatient setting by Dr. Clinton Sawyer continue replacement 500 mg 4 times a day. This may explain why he was  hypokalemic. Potassium will be replaced orally as well as       none  Code Status: She confirms DO NOT RESUSCITATE status at bedside Family Communication: -Wife informed and updated fully as to course of care and plan Disposition Plan: Inpatient telemetry   Time spent: 18  Mahala Menghini [facility] Pager [phone]  If 7PM-7AM, please contact night-coverage [email or web address] Password [facility] 12/12/2012, 5:40 PM

## 2012-12-13 LAB — CBC WITH DIFFERENTIAL/PLATELET
Basophils Absolute: 0 10*3/uL (ref 0.0–0.1)
Basophils Relative: 0 % (ref 0–1)
Eosinophils Absolute: 0 10*3/uL (ref 0.0–0.7)
Hemoglobin: 10.8 g/dL — ABNORMAL LOW (ref 13.0–17.0)
MCHC: 34.4 g/dL (ref 30.0–36.0)
Monocytes Relative: 2 % — ABNORMAL LOW (ref 3–12)
Neutro Abs: 20.7 10*3/uL — ABNORMAL HIGH (ref 1.7–7.7)
Neutrophils Relative %: 97 % — ABNORMAL HIGH (ref 43–77)
Platelets: 297 10*3/uL (ref 150–400)
RDW: 14.7 % (ref 11.5–15.5)

## 2012-12-13 LAB — EXPECTORATED SPUTUM ASSESSMENT W GRAM STAIN, RFLX TO RESP C: Special Requests: NORMAL

## 2012-12-13 LAB — COMPREHENSIVE METABOLIC PANEL
ALT: 17 U/L (ref 0–53)
AST: 16 U/L (ref 0–37)
Albumin: 2.7 g/dL — ABNORMAL LOW (ref 3.5–5.2)
Alkaline Phosphatase: 91 U/L (ref 39–117)
Chloride: 92 mEq/L — ABNORMAL LOW (ref 96–112)
Potassium: 3.2 mEq/L — ABNORMAL LOW (ref 3.5–5.1)
Sodium: 133 mEq/L — ABNORMAL LOW (ref 135–145)
Total Bilirubin: 0.4 mg/dL (ref 0.3–1.2)
Total Protein: 6.9 g/dL (ref 6.0–8.3)

## 2012-12-13 LAB — GLUCOSE, CAPILLARY
Glucose-Capillary: 397 mg/dL — ABNORMAL HIGH (ref 70–99)
Glucose-Capillary: 546 mg/dL — ABNORMAL HIGH (ref 70–99)
Glucose-Capillary: 459 mg/dL — ABNORMAL HIGH (ref 70–99)
Glucose-Capillary: 321 mg/dL — ABNORMAL HIGH (ref 70–99)

## 2012-12-13 LAB — LEGIONELLA ANTIGEN, URINE: Legionella Antigen, Urine: NEGATIVE

## 2012-12-13 LAB — STREP PNEUMONIAE URINARY ANTIGEN: Strep Pneumo Urinary Antigen: NEGATIVE

## 2012-12-13 MED ORDER — INSULIN ASPART 100 UNIT/ML ~~LOC~~ SOLN
10.0000 [IU] | Freq: Once | SUBCUTANEOUS | Status: AC
  Administered 2012-12-13: 10 [IU] via SUBCUTANEOUS

## 2012-12-13 MED ORDER — METHYLPREDNISOLONE SODIUM SUCC 125 MG IJ SOLR
125.0000 mg | Freq: Three times a day (TID) | INTRAMUSCULAR | Status: DC
  Filled 2012-12-13 (×2): qty 2

## 2012-12-13 MED ORDER — INSULIN GLARGINE 100 UNIT/ML ~~LOC~~ SOLN
70.0000 [IU] | Freq: Every morning | SUBCUTANEOUS | Status: DC
  Administered 2012-12-14: 70 [IU] via SUBCUTANEOUS

## 2012-12-13 MED ORDER — IBUPROFEN 600 MG PO TABS
600.0000 mg | ORAL_TABLET | Freq: Three times a day (TID) | ORAL | Status: DC
  Administered 2012-12-13 – 2012-12-15 (×7): 600 mg via ORAL
  Filled 2012-12-13 (×11): qty 1

## 2012-12-13 MED ORDER — CALCIUM CARBONATE ANTACID 500 MG PO CHEW
400.0000 mg | CHEWABLE_TABLET | Freq: Three times a day (TID) | ORAL | Status: DC
  Administered 2012-12-13 – 2012-12-16 (×9): 400 mg via ORAL
  Filled 2012-12-13 (×11): qty 2

## 2012-12-13 MED ORDER — METHYLPREDNISOLONE SODIUM SUCC 125 MG IJ SOLR
80.0000 mg | Freq: Two times a day (BID) | INTRAMUSCULAR | Status: DC
  Administered 2012-12-13 – 2012-12-15 (×4): 80 mg via INTRAVENOUS
  Filled 2012-12-13 (×6): qty 1.28

## 2012-12-13 MED ORDER — OSELTAMIVIR PHOSPHATE 75 MG PO CAPS
75.0000 mg | ORAL_CAPSULE | Freq: Every day | ORAL | Status: DC
  Administered 2012-12-13 – 2012-12-14 (×2): 75 mg via ORAL
  Filled 2012-12-13 (×2): qty 1

## 2012-12-13 MED ORDER — INSULIN ASPART 100 UNIT/ML ~~LOC~~ SOLN
0.0000 [IU] | Freq: Three times a day (TID) | SUBCUTANEOUS | Status: DC
  Administered 2012-12-13 – 2012-12-14 (×4): 15 [IU] via SUBCUTANEOUS
  Administered 2012-12-15: 11 [IU] via SUBCUTANEOUS
  Administered 2012-12-15: 15 [IU] via SUBCUTANEOUS

## 2012-12-13 MED ORDER — ALBUTEROL SULFATE (5 MG/ML) 0.5% IN NEBU
5.0000 mg | INHALATION_SOLUTION | Freq: Three times a day (TID) | RESPIRATORY_TRACT | Status: DC
  Administered 2012-12-13 – 2012-12-16 (×10): 5 mg via RESPIRATORY_TRACT
  Filled 2012-12-13: qty 1
  Filled 2012-12-13: qty 0.5
  Filled 2012-12-13: qty 1
  Filled 2012-12-13 (×2): qty 0.5
  Filled 2012-12-13: qty 1
  Filled 2012-12-13 (×3): qty 0.5
  Filled 2012-12-13: qty 1
  Filled 2012-12-13 (×3): qty 0.5

## 2012-12-13 NOTE — Progress Notes (Signed)
PROGRESS NOTE  [name] ZOX:[id number] DOB: 05-Dec-1950 DOA: 12/12/2012 PCP: Leo Grosser, MD  Brief narrative: [name] is a 62 y.o. male with multiple med issues who presented to the ED 2.7.14-he states that this started a couple of days ago. Wheezing started 2.4.14. He felt as if he was coughing more and couldn't sleep-he states he is producing more sputum which is white and developed a fever-Not measured or recorded.  He states he has had some n/v and diarrhea as well-Diarrhea he had maybe 3-4 episodes-seemed losse and watery and greenish which stopped by itself 2.7.14 am-he states that he vomited multiple times on 2.5.13, which was post-tussive-he has had increasing wheezing and was started on prednisone from last hospital issues-was taking 5 tabs the first day in an attempt to help taper this which seemd not to help much.  HE developed increasing wob and gasping with minimal activity. Noted his pulse ox was 82% at home and low R of 31. Usually uses O2 prn but wife forced him to put it on. Wife went into work and in the afternoon he fell, felt weaker and is unsure as to whether he had LOC at the time  He has had some CP on the L side of the chest-lasted for just a little while-states there is some radiation into arm-none into neck-states he had this 3-4 times prior to coming-the longest it lasted was about 5 min   Past medical history-As per Problem list Chart reviewed as below- See original HPI dated 2.7.14  Consultants:  none  Procedures:  CXR 2.7=Cardiomegaly and chronic interstitial thickening. Favored to be the sequelae of chronic bronchitis/smoking. Mild pulmonary venous congestion is difficult to exclude.  Antibiotics:  Cefepime 12/12/12>> 12/13/12  Levaquin>> stop date 12/17/12  Vancomycin 12/12/12>> 12/13/38   Subjective  Clear. Had a headache yesterday. Shortness breath seems somewhat improved. Still producing sputum No chest pain no further diarrhea no  fever no chills No nausea no vomiting at present   Objective    Interim History: none  Telemetry: Sinus rhythm  Objective: Filed Vitals:   12/13/12 0415 12/13/12 0504 12/13/12 0827 12/13/12 1045  BP:  140/68  116/69  Pulse:  92    Temp:  97.6 F (36.4 C)    TempSrc:  Oral    Resp:  18    Height:      Weight:  122.29 kg (269 lb 9.6 oz)    SpO2: 93% 94% 98%     Intake/Output Summary (Last 24 hours) at 12/13/12 1203 Last data filed at 12/13/12 0910  Gross per 24 hour  Intake   1650 ml  Output   1710 ml  Net    -60 ml    Exam:  General: Alert pleasant Cardiovascular: S1-S2 no murmur rub or gallop Respiratory: Wheezes throughout with some crackles Abdomen: soft nontender nondistended Skin mild lower extremity edema Neuro grossly intact  Data Reviewed: Basic Metabolic Panel:  Recent Labs Lab 12/12/12 1538 12/12/12 2106 12/13/12 0500  NA 136  --  133*  K 2.5*  --  3.2*  CL 93*  --  92*  CO2 23  --  21  GLUCOSE 194*  --  424*  BUN 24*  --  28*  CREATININE 1.32 1.41* 1.48*  CALCIUM 6.0*  --  5.9*   Liver Function Tests:  Recent Labs Lab 12/12/12 1538 12/13/12 0500  AST 21 16  ALT 17 17  ALKPHOS 85 91  BILITOT 0.4 0.4  PROT 6.8  6.9  ALBUMIN 2.6* 2.7*   No results found for this basename: LIPASE, AMYLASE,  in the last 168 hours No results found for this basename: AMMONIA,  in the last 168 hours CBC:  Recent Labs Lab 12/12/12 1538 12/12/12 2106 12/13/12 0500  WBC 23.0* 23.3* 21.4*  NEUTROABS 21.7*  --  20.7*  HGB 11.8* 11.5* 10.8*  HCT 33.2* 33.5* 31.4*  MCV 85.8 87.0 84.4  PLT 334 328 297   Cardiac Enzymes:  Recent Labs Lab 12/12/12 1543  TROPONINI <0.30   BNP: No components found with this basename: POCBNP,  CBG:  Recent Labs Lab 12/13/12 0501 12/13/12 0705  GLUCAP 397* 381*    Recent Results (from the past 240 hour(s))  CULTURE, EXPECTORATED SPUTUM-ASSESSMENT     Status: None   Collection Time    12/12/12  9:41 PM       Result Value Range Status   Specimen Description SPUTUM   Final   Special Requests Normal   Final   Sputum evaluation     Final   Value: THIS SPECIMEN IS ACCEPTABLE. RESPIRATORY CULTURE REPORT TO FOLLOW.   Report Status 12/13/2012 FINAL   Final  CULTURE, RESPIRATORY (NON-EXPECTORATED)     Status: None   Collection Time    12/12/12  9:41 PM      Result Value Range Status   Specimen Description SPUTUM   Final   Special Requests NONE   Final   Gram Stain     Final   Value: MODERATE WBC PRESENT,BOTH PMN AND MONONUCLEAR     FEW SQUAMOUS EPITHELIAL CELLS PRESENT     FEW GRAM POSITIVE COCCI IN CLUSTERS     IN PAIRS IN CHAINS RARE GRAM POSITIVE RODS   Culture PENDING   Incomplete   Report Status PENDING   Incomplete     Studies:              All Imaging reviewed and is as per above notation   Scheduled Meds: . albuterol  5 mg Nebulization TID  . antiseptic oral rinse  15 mL Mouth Rinse BID  . aspirin EC  81 mg Oral Daily  . ceFEPime (MAXIPIME) IV  1 g Intravenous Q8H  . diltiazem  120 mg Oral Daily  . FLUoxetine  40 mg Oral Daily  . fluticasone  3 puff Inhalation BID  . gabapentin  300 mg Oral TID  . heparin  5,000 Units Subcutaneous Q8H  . insulin glargine  60 Units Subcutaneous q morning - 10a  . levofloxacin (LEVAQUIN) IV  750 mg Intravenous Q24H  . magnesium oxide  200 mg Oral QID  . methylPREDNISolone (SOLU-MEDROL) injection  125 mg Intravenous Q6H  . multivitamin with minerals  1 tablet Oral Daily  . nicotine  21 mg Transdermal Daily  . pantoprazole  40 mg Oral Daily  . potassium chloride  40 mEq Oral BID  . tiotropium  18 mcg Inhalation Daily  . traZODone  100 mg Oral QHS  . vancomycin  1,250 mg Intravenous Q12H   Continuous Infusions: . sodium chloride 10 mL/hr at 12/12/12 2136     Assessment/Plan: 1. Acute respiratory failure with hypoxia-patient's sats getting better. Monitor. 2. Elevated BNP-unclear significance. His issues are more respiratory and he does  not appear volume overloaded-hold Lasix for now 3. Likely viral bronchitis versus pneumonia, complicating chest x-ray unreliable the patient has postop changes. Await adenovirus PCR,  Legionella as well as strep pneumo urinary antigens-flu PCR still pending Expectorated sputum--start Tamiflu which  will be discontinued if PCR negative 4. AECOPD-continue Spiriva 18 mcg daily, albuterol 5 mg every 4 hourly, Flovent 3 puffs twice a day, we will start stress dose Solu-Medrol 125 mg q6 hourly -potential transitioned to oral steroids in one to 2 days 5. Uncontrolled diabetes mellitus-add  Resistant SSI insulin + mealtime coverage/no QHS  to Lantus which was increased from 60-70 units 12/13/12 6. 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 Laneta Simmers 10/07/2012 there was no recurrence of disease in lungs. 7. CAD-history stenting in 2006-patient to continue chronic meds including aspirin 81 mg-ideally should be on an ACE inhibitor-Beta blocker relatively contraindicated given significant pulmonary disease 8. Continued smoking habit-patient will get nicotine patch 21 mcg he was strongly encouraged to desist-he realizes that this is life-threatening he continues 9. Hypertension-continue Cardizem 120 mg daily-moderately controlled present 10. Chronic pain-continue oxycodone immediate release 5 mg daily when necessary, gabapentin 300 3 times a day 11. Depression-continue Prozac 40 mg daily 12. History of diverticulitis 09/2012-monitor closely. No indication at present for imaging given diarrhea has stopped. 13. History of chronic hypomagnesemia + hypocalcemia and hypokalemia-we will replace.   Code Status: DO NOT RESUSCITATE Family Communication: Updated wife at bedside Disposition Plan: Inpatient   Pleas Koch, MD  [facility] Pager [phone] 12/13/2012, 12:03 PM    LOS: 1 day

## 2012-12-13 NOTE — Progress Notes (Signed)
Patient's Influenza PCR was negative; patient taken off of Droplet Precautions.  Lorretta Harp RN

## 2012-12-13 NOTE — Progress Notes (Signed)
Brief Nutrition Note:  RD pulled to pt for malnutrition screening tool score of 2, pt indicated unintentional weight loss PTA. Pt comment indicates weight loss is "due to my fluid pills".   Wt Readings from Last 5 Encounters:  12/13/12 269 lb 9.6 oz (122.29 kg)  10/07/12 290 lb (131.543 kg)  09/29/12 275 lb 12.7 oz (125.1 kg)  09/21/12 287 lb 7.7 oz (130.4 kg)  08/21/12 287 lb 6.4 oz (130.364 kg)   Body mass index is 37.62 kg/(m^2). Obesity class 2  Diet: Heart Healthy PO intake: none documented at this time.   Chart reviewed, no nutrition interventions warranted at this time. Please consult as needed.   Clarene Duke RD, LDN Pager [phone] After Hours pager [phone]

## 2012-12-13 NOTE — Progress Notes (Signed)
Patient's CBG is 567, MD notified and orders given; will continue to monitor patient. Lorretta Harp RN

## 2012-12-14 DIAGNOSIS — J9600 Acute respiratory failure, unspecified whether with hypoxia or hypercapnia: Principal | ICD-10-CM

## 2012-12-14 DIAGNOSIS — N179 Acute kidney failure, unspecified: Secondary | ICD-10-CM

## 2012-12-14 DIAGNOSIS — D649 Anemia, unspecified: Secondary | ICD-10-CM

## 2012-12-14 LAB — BASIC METABOLIC PANEL
BUN: 35 mg/dL — ABNORMAL HIGH (ref 6–23)
Chloride: 99 mEq/L (ref 96–112)
Creatinine, Ser: 1.57 mg/dL — ABNORMAL HIGH (ref 0.50–1.35)
GFR calc Af Amer: 53 mL/min — ABNORMAL LOW (ref >90)
Glucose, Bld: 348 mg/dL — ABNORMAL HIGH (ref 70–99)
Potassium: 3.5 mEq/L (ref 3.5–5.1)

## 2012-12-14 LAB — GLUCOSE, CAPILLARY
Glucose-Capillary: 366 mg/dL — ABNORMAL HIGH (ref 70–99)
Glucose-Capillary: 389 mg/dL — ABNORMAL HIGH (ref 70–99)

## 2012-12-14 MED ORDER — LEVOFLOXACIN 500 MG PO TABS
500.0000 mg | ORAL_TABLET | Freq: Every day | ORAL | Status: DC
  Administered 2012-12-14 – 2012-12-16 (×3): 500 mg via ORAL
  Filled 2012-12-14 (×3): qty 1

## 2012-12-14 MED ORDER — ZOLPIDEM TARTRATE 5 MG PO TABS
5.0000 mg | ORAL_TABLET | Freq: Once | ORAL | Status: AC
  Administered 2012-12-14: 5 mg via ORAL
  Filled 2012-12-14: qty 1

## 2012-12-14 MED ORDER — INSULIN ASPART 100 UNIT/ML ~~LOC~~ SOLN
15.0000 [IU] | Freq: Once | SUBCUTANEOUS | Status: AC
  Administered 2012-12-14: 15 [IU] via SUBCUTANEOUS

## 2012-12-14 MED ORDER — INSULIN GLARGINE 100 UNIT/ML ~~LOC~~ SOLN
75.0000 [IU] | Freq: Every morning | SUBCUTANEOUS | Status: DC
  Administered 2012-12-15 – 2012-12-16 (×2): 75 [IU] via SUBCUTANEOUS

## 2012-12-14 MED ORDER — FUROSEMIDE 40 MG PO TABS
40.0000 mg | ORAL_TABLET | Freq: Every day | ORAL | Status: DC
  Administered 2012-12-14: 40 mg via ORAL
  Filled 2012-12-14 (×2): qty 1

## 2012-12-14 NOTE — Progress Notes (Signed)
1658 placed acall to DR. Samtani re==CBG 422   To give 15 units of novolog 15 units and do CBG after 30 mins and call result

## 2012-12-14 NOTE — Progress Notes (Addendum)
PROGRESS NOTE  [name] NFA:[id number] DOB: 1951/10/29 DOA: 12/12/2012 PCP: Leo Grosser, MD  Brief narrative: [name] is a 62 y.o. male with multiple med issues who presented to the ED 2.7.14-he states that this started a couple of days ago. Wheezing started 2.4.14. He felt as if he was coughing more and couldn't sleep-he states he is producing more sputum which is white and developed a fever-Not measured or recorded.  He states he has had some n/v and diarrhea as well-Diarrhea he had maybe 3-4 episodes-seemed losse and watery and greenish which stopped by itself 2.7.14 am-he states that he vomited multiple times on 2.5.13, which was post-tussive-he has had increasing wheezing and was started on prednisone from last hospital issues-was taking 5 tabs the first day in an attempt to help taper this which seemd not to help much.  HE developed increasing wob and gasping with minimal activity. Noted his pulse ox was 82% at home and low R of 31. Usually uses O2 prn but wife forced him to put it on. Wife went into work and in the afternoon he fell, felt weaker and is unsure as to whether he had LOC at the time  Patient was admitted in active dose IV steroids as well as around-the-clock nebulizations and started to improve slowly    Past medical history-As per Problem list Chart reviewed as below- See original HPI dated 2.7.14  Consultants:  none  Procedures:  CXR 2.7=Cardiomegaly and chronic interstitial thickening. Favored to be the sequelae of chronic bronchitis/smoking. Mild pulmonary venous congestion is difficult to exclude.  Antibiotics:  Cefepime 12/12/12>> 12/13/12  Levaquin>> stop date 12/17/12  Vancomycin 12/12/12>> 12/13/38   Subjective  Still has headache. Cut his head while shaving it. Still short of breath and feels but seems he did yesterday no worse or better. Sputum still present Feels as if he has swollen little bit with all the fluid he got from  antibiotics and other medications  Tolerating by mouth   Objective    Interim History: none  Telemetry: Sinus rhythm  Objective: Filed Vitals:   12/13/12 2110 12/14/12 0521 12/14/12 0809 12/14/12 1002  BP: 111/54 130/74  105/63  Pulse: 90 84  85  Temp: 97.3 F (36.3 C) 97.3 F (36.3 C)    TempSrc: Oral Oral    Resp: 18 20    Height:      Weight:  124.648 kg (274 lb 12.8 oz)    SpO2: 96% 96% 94%     Intake/Output Summary (Last 24 hours) at 12/14/12 1254 Last data filed at 12/14/12 1231  Gross per 24 hour  Intake   2340 ml  Output   2850 ml  Net   -510 ml    Exam:  General: Alert pleasant Cardiovascular: S1-S2 no murmur rub or gallop Respiratory: Wheezes throughout  Abdomen: soft nontender nondistended Skin mild lower extremity edema Neuro grossly intact  Data Reviewed: Basic Metabolic Panel:  Recent Labs Lab 12/12/12 1538 12/12/12 2106 12/13/12 0500 12/14/12 0515  NA 136  --  133* 136  K 2.5*  --  3.2* 3.5  CL 93*  --  92* 99  CO2 23  --  21 21  GLUCOSE 194*  --  424* 348*  BUN 24*  --  28* 35*  CREATININE 1.32 1.41* 1.48* 1.57*  CALCIUM 6.0*  --  5.9* 6.2*   Liver Function Tests:  Recent Labs Lab 12/12/12 1538 12/13/12 0500  AST 21 16  ALT 17 17  ALKPHOS 85 91  BILITOT 0.4 0.4  PROT 6.8 6.9  ALBUMIN 2.6* 2.7*   No results found for this basename: LIPASE, AMYLASE,  in the last 168 hours No results found for this basename: AMMONIA,  in the last 168 hours CBC:  Recent Labs Lab 12/12/12 1538 12/12/12 2106 12/13/12 0500  WBC 23.0* 23.3* 21.4*  NEUTROABS 21.7*  --  20.7*  HGB 11.8* 11.5* 10.8*  HCT 33.2* 33.5* 31.4*  MCV 85.8 87.0 84.4  PLT 334 328 297   Cardiac Enzymes:  Recent Labs Lab 12/12/12 1543  TROPONINI <0.30   BNP: No components found with this basename: POCBNP,  CBG:  Recent Labs Lab 12/13/12 1352 12/13/12 1655 12/13/12 1851 12/13/12 2123 12/14/12 0554  GLUCAP 546* 567* 459* 321* 366*    Recent  Results (from the past 240 hour(s))  CULTURE, BLOOD (ROUTINE X 2)     Status: None   Collection Time    12/12/12  9:07 PM      Result Value Range Status   Specimen Description BLOOD RIGHT HAND   Final   Special Requests BOTTLES DRAWN AEROBIC AND ANAEROBIC 10CC   Final   Culture  Setup Time 12/13/2012 02:13   Final   Culture     Final   Value:        BLOOD CULTURE RECEIVED NO GROWTH TO DATE CULTURE WILL BE HELD FOR 5 DAYS BEFORE ISSUING A FINAL NEGATIVE REPORT   Report Status PENDING   Incomplete  CULTURE, BLOOD (ROUTINE X 2)     Status: None   Collection Time    12/12/12  9:07 PM      Result Value Range Status   Specimen Description BLOOD RIGHT ARM   Final   Special Requests BOTTLES DRAWN AEROBIC AND ANAEROBIC 10CC   Final   Culture  Setup Time 12/13/2012 02:14   Final   Culture     Final   Value:        BLOOD CULTURE RECEIVED NO GROWTH TO DATE CULTURE WILL BE HELD FOR 5 DAYS BEFORE ISSUING A FINAL NEGATIVE REPORT   Report Status PENDING   Incomplete  CULTURE, EXPECTORATED SPUTUM-ASSESSMENT     Status: None   Collection Time    12/12/12  9:41 PM      Result Value Range Status   Specimen Description SPUTUM   Final   Special Requests Normal   Final   Sputum evaluation     Final   Value: THIS SPECIMEN IS ACCEPTABLE. RESPIRATORY CULTURE REPORT TO FOLLOW.   Report Status 12/13/2012 FINAL   Final  CULTURE, RESPIRATORY (NON-EXPECTORATED)     Status: None   Collection Time    12/12/12  9:41 PM      Result Value Range Status   Specimen Description SPUTUM   Final   Special Requests NONE   Final   Gram Stain     Final   Value: MODERATE WBC PRESENT,BOTH PMN AND MONONUCLEAR     FEW SQUAMOUS EPITHELIAL CELLS PRESENT     FEW GRAM POSITIVE COCCI IN CLUSTERS     IN PAIRS IN CHAINS RARE GRAM POSITIVE RODS   Culture NORMAL OROPHARYNGEAL FLORA   Final   Report Status PENDING   Incomplete     Studies:              All Imaging reviewed and is as per above notation   Scheduled Meds: .  albuterol  5 mg Nebulization TID  . antiseptic oral rinse  15 mL Mouth Rinse BID  . aspirin EC  81 mg Oral Daily  . calcium carbonate  400 mg of elemental calcium Oral TID  . diltiazem  120 mg Oral Daily  . FLUoxetine  40 mg Oral Daily  . fluticasone  3 puff Inhalation BID  . furosemide  40 mg Oral Daily  . gabapentin  300 mg Oral TID  . heparin  5,000 Units Subcutaneous Q8H  . ibuprofen  600 mg Oral TID WC & HS  . insulin aspart  0-15 Units Subcutaneous TID WC  . insulin glargine  70 Units Subcutaneous q morning - 10a  . levofloxacin  500 mg Oral Daily  . magnesium oxide  200 mg Oral QID  . methylPREDNISolone (SOLU-MEDROL) injection  80 mg Intravenous Q12H  . multivitamin with minerals  1 tablet Oral Daily  . nicotine  21 mg Transdermal Daily  . pantoprazole  40 mg Oral Daily  . tiotropium  18 mcg Inhalation Daily  . traZODone  100 mg Oral QHS   Continuous Infusions: . sodium chloride 10 mL/hr at 12/12/12 2136     Assessment/Plan: 1. Acute respiratory failure with hypoxia-patient's sats getting better. Monitor. 2. Elevated BNP-unclear significance. His issues are more respiratory and he does not appear volume overloaded-given he feels somewhat swollen will give one dose 40 mg Lasix now-monitor weight in am 3. Likely viral bronchitis versus pneumonia, complicating chest x-ray unreliable the patient has postop changes. 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 Uncontrolled diabetes mellitus-add  Resistant SSI insulin + mealtime coverage/no QHS  to Lantus blood sugar still elevated in the 300 range. Increase Lantus to 75 units 2/9 6. Mild renal insufficiency-monitor  carefully get basic metabolic panel more as giving 1 dose of Lasix today. 7. 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 Laneta Simmers 10/07/2012 there was no recurrence of disease in lungs. 8. CAD-history stenting in 2006-patient to continue chronic meds including aspirin 81 mg-ideally should be on an ACE inhibitor-Beta blocker relatively contraindicated given significant pulmonary disease 9. Continued smoking habit-patient will get nicotine patch 21 mcg he was strongly encouraged to desist-he realizes that this is life-threatening he continues 10. Hypertension-continue Cardizem 120 mg daily-moderately controlled present 11. Chronic pain-continue oxycodone immediate release 5 mg daily when necessary, gabapentin 300 3 times a day 12. Depression-continue Prozac 40 mg daily 13. History of diverticulitis 09/2012-monitor closely. No indication at present for imaging given diarrhea has stopped. 14. History of chronic hypomagnesemia + hypocalcemia and hypokalemia-we will replace.   Code Status: DO NOT RESUSCITATE Family Communication: Updated wife at bedside Disposition Plan: Inpatient-likely needs one to 2 more days of IV steroids   Pleas Koch, MD  [facility] Pager [phone] 12/14/2012, 12:54 PM    LOS: 2 days

## 2012-12-15 LAB — CULTURE, RESPIRATORY W GRAM STAIN

## 2012-12-15 LAB — GLUCOSE, CAPILLARY
Glucose-Capillary: 380 mg/dL — ABNORMAL HIGH (ref 70–99)
Glucose-Capillary: 303 mg/dL — ABNORMAL HIGH (ref 70–99)
Glucose-Capillary: 378 mg/dL — ABNORMAL HIGH (ref 70–99)
Glucose-Capillary: 308 mg/dL — ABNORMAL HIGH (ref 70–99)
Glucose-Capillary: 240 mg/dL — ABNORMAL HIGH (ref 70–99)

## 2012-12-15 LAB — BASIC METABOLIC PANEL
BUN: 34 mg/dL — ABNORMAL HIGH (ref 6–23)
Calcium: 6.9 mg/dL — ABNORMAL LOW (ref 8.4–10.5)
Creatinine, Ser: 1.37 mg/dL — ABNORMAL HIGH (ref 0.50–1.35)
GFR calc Af Amer: 62 mL/min — ABNORMAL LOW (ref >90)

## 2012-12-15 MED ORDER — PREDNISONE 10 MG PO TABS
60.0000 mg | ORAL_TABLET | Freq: Every day | ORAL | Status: DC
  Administered 2012-12-15 – 2012-12-16 (×2): 60 mg via ORAL
  Filled 2012-12-15 (×4): qty 1

## 2012-12-15 MED ORDER — INSULIN ASPART 100 UNIT/ML ~~LOC~~ SOLN
0.0000 [IU] | Freq: Three times a day (TID) | SUBCUTANEOUS | Status: DC
  Administered 2012-12-15: 15 [IU] via SUBCUTANEOUS
  Administered 2012-12-16: 3 [IU] via SUBCUTANEOUS

## 2012-12-15 MED ORDER — INSULIN ASPART 100 UNIT/ML ~~LOC~~ SOLN
4.0000 [IU] | Freq: Three times a day (TID) | SUBCUTANEOUS | Status: DC
  Administered 2012-12-15 – 2012-12-16 (×2): 4 [IU] via SUBCUTANEOUS

## 2012-12-15 NOTE — Progress Notes (Addendum)
ANTIBIOTIC CONSULT NOTE - Follow-up  Pharmacy Consult for levaquin Indication: ?PNA  Allergies  Allergen Reactions  . Dilaudid (Hydromorphone Hcl)     Made him crazy    Patient Measurements: Height: 5\' 11"  (180.3 cm) Weight: 277 lb 1.9 oz (125.7 kg) (B SCALE) IBW/kg (Calculated) : 75.3  Vital Signs: Temp: 97.3 F (36.3 C) (02/10 0550) Temp src: Oral (02/10 0550) BP: 125/67 mmHg (02/10 0550) Pulse Rate: 88 (02/10 0550) Intake/Output from previous day: 02/09 0701 - 02/10 0700 In: 1980 [P.O.:1980] Out: 2800 [Urine:2800] Intake/Output from this shift: Total I/O In: 240 [P.O.:240] Out: 375 [Urine:375]  Labs:  Recent Labs  12/12/12 1538 12/12/12 2106 12/13/12 0500 12/14/12 0515  WBC 23.0* 23.3* 21.4*  --   HGB 11.8* 11.5* 10.8*  --   PLT 334 328 297  --   CREATININE 1.32 1.41* 1.48* 1.57*   Estimated Creatinine Clearance: 65.9 ml/min (by C-G formula based on Cr of 1.57). No results found for this basename: VANCOTROUGH, Leodis Binet, VANCORANDOM, GENTTROUGH, GENTPEAK, GENTRANDOM, TOBRATROUGH, TOBRAPEAK, TOBRARND, AMIKACINPEAK, AMIKACINTROU, AMIKACIN,  in the last 72 hours   Microbiology: Recent Results (from the past 720 hour(s))  CULTURE, BLOOD (ROUTINE X 2)     Status: None   Collection Time    12/12/12  9:07 PM      Result Value Range Status   Specimen Description BLOOD RIGHT HAND   Final   Special Requests BOTTLES DRAWN AEROBIC AND ANAEROBIC 10CC   Final   Culture  Setup Time 12/13/2012 02:13   Final   Culture     Final   Value:        BLOOD CULTURE RECEIVED NO GROWTH TO DATE CULTURE WILL BE HELD FOR 5 DAYS BEFORE ISSUING A FINAL NEGATIVE REPORT   Report Status PENDING   Incomplete  CULTURE, BLOOD (ROUTINE X 2)     Status: None   Collection Time    12/12/12  9:07 PM      Result Value Range Status   Specimen Description BLOOD RIGHT ARM   Final   Special Requests BOTTLES DRAWN AEROBIC AND ANAEROBIC 10CC   Final   Culture  Setup Time 12/13/2012 02:14   Final   Culture     Final   Value:        BLOOD CULTURE RECEIVED NO GROWTH TO DATE CULTURE WILL BE HELD FOR 5 DAYS BEFORE ISSUING A FINAL NEGATIVE REPORT   Report Status PENDING   Incomplete  CULTURE, EXPECTORATED SPUTUM-ASSESSMENT     Status: None   Collection Time    12/12/12  9:41 PM      Result Value Range Status   Specimen Description SPUTUM   Final   Special Requests Normal   Final   Sputum evaluation     Final   Value: THIS SPECIMEN IS ACCEPTABLE. RESPIRATORY CULTURE REPORT TO FOLLOW.   Report Status 12/13/2012 FINAL   Final  CULTURE, RESPIRATORY (NON-EXPECTORATED)     Status: None   Collection Time    12/12/12  9:41 PM      Result Value Range Status   Specimen Description SPUTUM   Final   Special Requests NONE   Final   Gram Stain     Final   Value: MODERATE WBC PRESENT,BOTH PMN AND MONONUCLEAR     FEW SQUAMOUS EPITHELIAL CELLS PRESENT     FEW GRAM POSITIVE COCCI IN CLUSTERS     IN PAIRS IN CHAINS RARE GRAM POSITIVE RODS   Culture NORMAL OROPHARYNGEAL FLORA  Final   Report Status 12/15/2012 FINAL   Final   Assessment: 62 year old male presented to [facility] with multiple med issues including dizziness that started a couple of days PTA. History of Stage 1A NSCLC s/p lobectomy. Previously on vanc + cefepime + levaquin but this has been narrowed to levaquin monotherapy. Intended stop date of 2/12 per MD note. Pt is afebrile and WBC is elevated at 21.4 as of 2/8. Of note pt is also on steroids. Pts renal fxn has been slightly worsening but dose remains appropriate. Cultures are NGTD.   Plan:  1. Continue levaquin 500mg  PO daily  2. F/u renal fxn, C&S, clinical status and stop date 3. F/u glucose control - does he need an insulin drip?? MD please address  Lysle Pearl, PharmD, BCPS Pager # [phone] 12/15/2012 10:33 AM

## 2012-12-15 NOTE — Progress Notes (Signed)
PROGRESS NOTE  [name] ZOX:[id number] DOB: [birth year] DOA: 12/12/2012 PCP: Leo Grosser, MD  Brief narrative: [name] is a 62 y.o. male with multiple med issues who presented to the ED 2.7.14-he states that this started a couple of days ago. Wheezing started 2.4.14. He felt as if he was coughing more and couldn't sleep-he states he is producing more sputum which is white and developed a fever-Not measured or recorded.  He states he has had some n/v and diarrhea as well-Diarrhea he had maybe 3-4 episodes-seemed losse and watery and greenish which stopped by itself 2.7.14 am-he states that he vomited multiple times on 2.5.13, which was post-tussive-he has had increasing wheezing and was started on prednisone from last hospital issues-was taking 5 tabs the first day in an attempt to help taper this which seemd not to help much.  He developed increasing wob and gasping with minimal activity. Noted his pulse ox was 82% at home and low R of 31. Usually uses O2 prn but wife forced him to put it on. Wife went into work and in the afternoon he fell, felt weaker and is unsure as to whether he had LOC at the time  Patient was admitted in active dose IV steroids as well as around-the-clock nebulizations and started to improve slowly  Past medical history-As per Problem list Chart reviewed as below- See original HPI dated 2.7.14  Consultants:  none  Procedures:  CXR 2.7=Cardiomegaly and chronic interstitial thickening. Favored to be the sequelae of chronic bronchitis/smoking. Mild pulmonary venous congestion is difficult to exclude.  Antibiotics:  Cefepime 12/12/12>> 12/13/12  Levaquin>> stop date 12/17/12  Vancomycin 12/12/12>> 12/13/38   Subjective  Feels better. SOB better.  No other issues at present. Tol po well.  Ambulant.   Objective    Interim History: none  Telemetry: Sinus rhythm  Objective: Filed Vitals:   12/15/12 0637 12/15/12 0829 12/15/12 1153 12/15/12  1438  BP:   136/71 147/75  Pulse:   77 83  Temp:    97.7 F (36.5 C)  TempSrc:    Oral  Resp:    20  Height:      Weight: 125.7 kg (277 lb 1.9 oz)     SpO2:  94%  98%    Intake/Output Summary (Last 24 hours) at 12/15/12 1543 Last data filed at 12/15/12 1307  Gross per 24 hour  Intake   1500 ml  Output   2875 ml  Net  -1375 ml    Exam:  General: Alert pleasant Cardiovascular: S1-S2 no murmur rub or gallop Respiratory: Wheezes improved Abdomen: soft nontender nondistended Skin mild lower extremity edema Neuro grossly intact  Data Reviewed: Basic Metabolic Panel:  Recent Labs Lab 12/12/12 1538 12/12/12 2106 12/13/12 0500 12/14/12 0515  NA 136  --  133* 136  K 2.5*  --  3.2* 3.5  CL 93*  --  92* 99  CO2 23  --  21 21  GLUCOSE 194*  --  424* 348*  BUN 24*  --  28* 35*  CREATININE 1.32 1.41* 1.48* 1.57*  CALCIUM 6.0*  --  5.9* 6.2*   Liver Function Tests:  Recent Labs Lab 12/12/12 1538 12/13/12 0500  AST 21 16  ALT 17 17  ALKPHOS 85 91  BILITOT 0.4 0.4  PROT 6.8 6.9  ALBUMIN 2.6* 2.7*   No results found for this basename: LIPASE, AMYLASE,  in the last 168 hours No results found for this basename: AMMONIA,  in the last  168 hours CBC:  Recent Labs Lab 12/12/12 1538 12/12/12 2106 12/13/12 0500  WBC 23.0* 23.3* 21.4*  NEUTROABS 21.7*  --  20.7*  HGB 11.8* 11.5* 10.8*  HCT 33.2* 33.5* 31.4*  MCV 85.8 87.0 84.4  PLT 334 328 297   Cardiac Enzymes:  Recent Labs Lab 12/12/12 1543  TROPONINI <0.30   BNP: No components found with this basename: POCBNP,  CBG:  Recent Labs Lab 12/14/12 1621 12/14/12 1828 12/14/12 2138 12/15/12 0551 12/15/12 1128  GLUCAP 422* 466* 380* 303* 378*    Recent Results (from the past 240 hour(s))  CULTURE, BLOOD (ROUTINE X 2)     Status: None   Collection Time    12/12/12  9:07 PM      Result Value Range Status   Specimen Description BLOOD RIGHT HAND   Final   Special Requests BOTTLES DRAWN AEROBIC AND  ANAEROBIC 10CC   Final   Culture  Setup Time 12/13/2012 02:13   Final   Culture     Final   Value:        BLOOD CULTURE RECEIVED NO GROWTH TO DATE CULTURE WILL BE HELD FOR 5 DAYS BEFORE ISSUING A FINAL NEGATIVE REPORT   Report Status PENDING   Incomplete  CULTURE, BLOOD (ROUTINE X 2)     Status: None   Collection Time    12/12/12  9:07 PM      Result Value Range Status   Specimen Description BLOOD RIGHT ARM   Final   Special Requests BOTTLES DRAWN AEROBIC AND ANAEROBIC 10CC   Final   Culture  Setup Time 12/13/2012 02:14   Final   Culture     Final   Value:        BLOOD CULTURE RECEIVED NO GROWTH TO DATE CULTURE WILL BE HELD FOR 5 DAYS BEFORE ISSUING A FINAL NEGATIVE REPORT   Report Status PENDING   Incomplete  CULTURE, EXPECTORATED SPUTUM-ASSESSMENT     Status: None   Collection Time    12/12/12  9:41 PM      Result Value Range Status   Specimen Description SPUTUM   Final   Special Requests Normal   Final   Sputum evaluation     Final   Value: THIS SPECIMEN IS ACCEPTABLE. RESPIRATORY CULTURE REPORT TO FOLLOW.   Report Status 12/13/2012 FINAL   Final  CULTURE, RESPIRATORY (NON-EXPECTORATED)     Status: None   Collection Time    12/12/12  9:41 PM      Result Value Range Status   Specimen Description SPUTUM   Final   Special Requests NONE   Final   Gram Stain     Final   Value: MODERATE WBC PRESENT,BOTH PMN AND MONONUCLEAR     FEW SQUAMOUS EPITHELIAL CELLS PRESENT     FEW GRAM POSITIVE COCCI IN CLUSTERS     IN PAIRS IN CHAINS RARE GRAM POSITIVE RODS   Culture NORMAL OROPHARYNGEAL FLORA   Final   Report Status 12/15/2012 FINAL   Final     Studies:              All Imaging reviewed and is as per above notation   Scheduled Meds: . albuterol  5 mg Nebulization TID  . antiseptic oral rinse  15 mL Mouth Rinse BID  . aspirin EC  81 mg Oral Daily  . calcium carbonate  400 mg of elemental calcium Oral TID  . diltiazem  120 mg Oral Daily  . FLUoxetine  40 mg Oral  Daily  .  fluticasone  3 puff Inhalation BID  . gabapentin  300 mg Oral TID  . heparin  5,000 Units Subcutaneous Q8H  . insulin aspart  0-15 Units Subcutaneous TID WC  . insulin glargine  75 Units Subcutaneous q morning - 10a  . levofloxacin  500 mg Oral Daily  . magnesium oxide  200 mg Oral QID  . methylPREDNISolone (SOLU-MEDROL) injection  80 mg Intravenous Q12H  . multivitamin with minerals  1 tablet Oral Daily  . nicotine  21 mg Transdermal Daily  . pantoprazole  40 mg Oral Daily  . tiotropium  18 mcg Inhalation Daily  . traZODone  100 mg Oral QHS   Continuous Infusions: . sodium chloride 10 mL/hr at 12/12/12 2136     Assessment/Plan: 1. Acute respiratory failure with hypoxia-patient's sats getting better. Monitor. 2. Elevated BNP-unclear significance. His issues are more respiratory and he does not appear volume overloaded-given he feels somewhat swollen will give one dose 40 mg Lasix now-monitor weight in am 3. Likely viral bronchitis versus pneumonia, complicating chest x-ray unreliable the patient has postop changes. 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 Uncontrolled diabetes mellitus-add  Resistant SSI insulin + mealtime coverage + no QHS  to Lantus blood sugar still elevated in the 300 range. Increase Lantus to 75 units 2/9 6. Mild renal insufficiency-monitor carefully get basic metabolic panel more as giving 1 dose of Lasix today-this was d/c given renal insuff worsening 2.10 7. 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  Laneta Simmers 10/07/2012 there was no recurrence of disease in lungs. 8. CAD-history stenting in 2006-patient to continue chronic meds including aspirin 81 mg-ideally should be on an ACE inhibitor-Beta blocker relatively contraindicated given significant pulmonary disease 9. Continued smoking habit-patient will get nicotine patch 21 mcg he was strongly encouraged to desist-he realizes that this is life-threatening. 10. Hypertension-continue Cardizem 120 mg daily-moderately controlled present 11. Chronic pain-continue oxycodone immediate release 5 mg daily when necessary, gabapentin 300 3 times a day 12. Depression-continue Prozac 40 mg daily 13. History of diverticulitis 09/2012-monitor closely. No indication at present for imaging given diarrhea has stopped. 14. History of chronic hypomagnesemia + hypocalcemia and hypokalemia-recheck am   Code Status: DO NOT RESUSCITATE Family Communication: Updated wife at bedside Disposition Plan: Inpatient-likely needs one to 2 more days of IV steroids   Pleas Koch, MD  [facility] Pager [phone] 12/15/2012, 3:43 PM    LOS: 3 days

## 2012-12-15 NOTE — Progress Notes (Signed)
Utilization review completed.  

## 2012-12-16 LAB — ADENOVIRUS ANTIBODIES: Adenovirus Antibody: <1:8 {titer}

## 2012-12-16 LAB — MAGNESIUM: Magnesium: 1.3 mg/dL — ABNORMAL LOW (ref 1.5–2.5)

## 2012-12-16 MED ORDER — INSULIN GLARGINE 100 UNIT/ML ~~LOC~~ SOLN: 75.0000 [IU] | Freq: Every morning | SUBCUTANEOUS | Status: DC

## 2012-12-16 MED ORDER — LEVOFLOXACIN 500 MG PO TABS: 500.0000 mg | ORAL_TABLET | Freq: Every day | ORAL | Status: DC

## 2012-12-16 MED ORDER — PREDNISONE 20 MG PO TABS: 60.0000 mg | ORAL_TABLET | Freq: Every day | ORAL | Status: DC

## 2012-12-16 NOTE — Discharge Summary (Signed)
Physician Discharge Summary  [name] ZOX:[id number] DOB: May 19, 1951 DOA: 12/12/2012  PCP: Leo Grosser, MD  Admit date: 12/12/2012 Discharge date: 12/16/2012  Time spent: 40 minutes  Recommendations for Outpatient Follow-up:  1. Continue slow steroid taper as outpatient 2. Increased home dosages of Lantus on d/c 3. Continue chronic O2 use 4. Outpatient surveillance with oncology recommended as well as with thoracic surgery 5. Please get basic metabolic panel plus magnesium 2 phosphorus plus CBC in about a week  Discharge Diagnoses:  Principal Problem:   Acute respiratory failure with hypoxia Active Problems:   C O P D   Non-small cell carcinoma of lung, stage 1   Hypomagnesemia-chronic   OSA on CPAP   Chronic kidney disease   Pneumonia   Type II diabetes mellitus   Anginal pain   Discharge Condition: Good  Diet recommendation: Heart healthy diabetic  Filed Weights   12/15/12 0550 12/15/12 0637 12/16/12 0523  Weight: 126.5 kg (278 lb 14.1 oz) 125.7 kg (277 lb 1.9 oz) 125.057 kg (275 lb 11.2 oz)    History of present illness:  62 y.o. male with multiple med issues who presented to the ED 2.7.14-he states that this started a couple of days ago. Wheezing started 2.4.14. He felt as if he was coughing more and couldn't sleep-he states he is producing more sputum which is white and developed a fever-Not measured or recorded. In the ED, K=2.5, Bun/Creat=24/1.32  ProBNP was 787  Wbc-23, Hb 11.8, PLt 334  CXR=Cardiomegaly and chronic interstitial thickening. Favored to be  the sequelae of chronic bronchitis/smoking. Mild pulmonary venous  congestion is difficult to exclude. No further management   Hospital Course:   1. Acute respiratory failure with hypoxia-patient's sats getting better. Monitor. 2. Elevated BNP-unclear significance. His issues are more respiratory and he does not appear volume overloaded-given he feels somewhat swollen will give one dose 40 mg  Lasix now-monitor weight in am 3. Likely viral bronchitis versus pneumonia, complicating chest x-ray unreliable the patient has postop changes. Pneumonia workup negative so far-await results of Expectorated sputum culture--Tamiflu d/c given Flu PCR neg-narrowed antibiotics to Levaquin by mouth 4. AECOPD-continue Spiriva 18 mcg daily, albuterol 5 mg every 4 hourly, Flovent 3 puffs twice a day, we will start stress dose Solu-Medrol 125 mg q6 hourly -transitioned to PO steroids 2.10.14- d/c home with slow taper per Pulmonary-needs followup with primary care physician or pulmonologist in about 5-7 days 5. Uncontrolled diabetes mellitus-add Resistant SSI insulin + mealtime coverage + no QHS to Lantus blood sugar still elevated in the 300 range. Increase Lantus to 75 units 2/9 6. Mild renal insufficiency-monitor carefully get basic metabolic panel more as giving 1 dose of Lasix today-this was d/c given renal insuff worsening 2.10 7. 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 Laneta Simmers 10/07/2012 there was no recurrence of disease in lungs. 8. CAD-history stenting in 2006-patient to continue chronic meds including aspirin 81 mg-ideally should be on an ACE inhibitor-Beta blocker relatively contraindicated given significant pulmonary disease  9. Continued smoking habit-patient will get nicotine patch 21 mcg he was strongly encouraged to desist-he realizes that this is life-threatening. Patient will think about this in the outpatient setting and needs further encouragement management 10. Hypertension-continue Cardizem 120 mg daily-moderately controlled present 11. Chronic pain-continue oxycodone immediate release 5 mg daily when necessary, gabapentin 300 3 times a day 12. Depression-continue Prozac 40 mg daily 13.  History of diverticulitis 09/2012-monitor closely. No indication at present for imaging given diarrhea has  stopped. 14. History of chronic hypomagnesemia + hypocalcemia and hypokalemia-slightly decreased. Phosphorus normal. Continue outpatient   Past medical history-As per Problem list  Chart reviewed as below-  See original HPI dated 2.7.14  Consultants:  none Procedures:  CXR 2.7=Cardiomegaly and chronic interstitial thickening. Favored to be the sequelae of chronic bronchitis/smoking. Mild pulmonary venous congestion is difficult to exclude. Antibiotics:  Cefepime 12/12/12>> 12/13/12  Levaquin>> stop date 12/17/12  Vancomycin 12/12/12>> 12/13/12   Discharge Exam: Filed Vitals:   12/15/12 1438 12/15/12 2014 12/15/12 2141 12/16/12 0523  BP: 147/75 152/86  177/81  Pulse: 83 92  90  Temp: 97.7 F (36.5 C) 97.9 F (36.6 C)  98.2 F (36.8 C)  TempSrc: Oral Oral  Oral  Resp: 20 20  20   Height:      Weight:    125.057 kg (275 lb 11.2 oz)  SpO2: 98% 96% 96% 95%   Feels well.  SOB at baseline, no n/v/cp  General: Alert pleasant oriented wearing oxygen with nasal cannula Cardiovascular: S1-S2 no murmur rub or gallop Respiratory: Clinically clear wheezes decreased  Discharge Instructions  Discharge Orders   Future Orders Complete By Expires     Call MD for:  difficulty breathing, headache or visual disturbances  As directed     Call MD for:  hives  As directed     Call MD for:  persistant dizziness or light-headedness  As directed     Call MD for:  persistant nausea and vomiting  As directed     Call MD for:  temperature >100.4  As directed     Diet - low sodium heart healthy  As directed     Increase activity slowly  As directed         Medication List    STOP taking these medications       prednisoLONE 5 MG Tabs      TAKE these medications       acetaminophen 500 MG tablet  Commonly known as:  TYLENOL  Take 1,000 mg by mouth every 6 (six) hours as needed. For pain     albuterol (2.5 MG/3ML) 0.083% nebulizer solution  Commonly known as:  PROVENTIL  Take 3 mLs (2.5 mg  total) by nebulization every 4 (four) hours as needed. For shortness of breath     aspirin EC 81 MG tablet  Take 81 mg by mouth daily.     atorvastatin 20 MG tablet  Commonly known as:  LIPITOR  Take 20 mg by mouth daily.     beclomethasone 80 MCG/ACT inhaler  Commonly known as:  QVAR  Inhale 2 puffs into the lungs 2 (two) times daily. For shortness of breath     CALCIUM 600/VITAMIN D PO  Take 1 tablet by mouth daily.     diltiazem 120 MG 24 hr capsule  Commonly known as:  CARDIZEM CD  Take 120 mg by mouth daily.     FLUoxetine 40 MG capsule  Commonly known as:  PROZAC  Take 40 mg by mouth daily.     gabapentin 300 MG capsule  Commonly known as:  NEURONTIN  Take 300 mg by mouth 3 (three) times daily.     insulin aspart 100 UNIT/ML injection  Commonly known as:  novoLOG  Inject 10-25 Units into the skin 3 (three) times daily before meals. CBG < 70: implement hypoglycemia protocol  CBG 70 - 120: 0  units  CBG 121 - 150: 5 units  CBG 151 - 200: 10 units  CBG 201 - 250: 15 units  CBG 251 - 300: 20 units  CBG 301 - 350: 25 units  CBG 351 - 400: 30 units     insulin glargine 100 UNIT/ML injection  Commonly known as:  LANTUS  Inject 75 Units into the skin every morning.     levalbuterol 0.63 MG/3ML nebulizer solution  Commonly known as:  XOPENEX  Take 1 ampule by nebulization every 4 (four) hours as needed. For shortness of breath     levofloxacin 500 MG tablet  Commonly known as:  LEVAQUIN  Take 1 tablet (500 mg total) by mouth daily.     Magnesium 250 MG Tabs  Take 500 mg by mouth 4 (four) times daily.     multivitamin with minerals Tabs  Take 1 tablet by mouth daily.     oxyCODONE 5 MG immediate release tablet  Commonly known as:  Oxy IR/ROXICODONE  Take 5 mg by mouth every 8 (eight) hours as needed. For pain     pantoprazole 40 MG tablet  Commonly known as:  PROTONIX  Take 40 mg by mouth daily.     predniSONE 20 MG tablet  Commonly known as:   DELTASONE  Take 3 tablets (60 mg total) by mouth daily before breakfast.     thiamine 100 MG tablet  Commonly known as:  VITAMIN B-1  Take 100 mg by mouth daily.     tiotropium 18 MCG inhalation capsule  Commonly known as:  SPIRIVA  Place 18 mcg into inhaler and inhale daily.     traZODone 100 MG tablet  Commonly known as:  DESYREL  Take 100 mg by mouth at bedtime.          The results of significant diagnostics from this hospitalization (including imaging, microbiology, ancillary and laboratory) are listed below for reference.    Significant Diagnostic Studies: Dg Chest 2 View  12/12/2012  *RADIOLOGY REPORT*  Clinical Data: Chest pain and shortness of breath.  CHEST - 2 VIEW  Comparison: Plain film of 09/23/2012 and CT of 10/07/2012.  Findings: Midline trachea.  Mild cardiomegaly.  Right upper lobe scarring, likely postoperative. No pleural effusion or pneumothorax.  No lobar consolidation.  Diffuse interstitial thickening.  Slightly greater on the left than right.  Overall similar to 09/22/2012.  IMPRESSION: Cardiomegaly and chronic interstitial thickening.  Favored to be the sequelae of chronic bronchitis/smoking.  Mild pulmonary venous congestion is difficult to exclude.  Right-sided surgical changes.   Original Report Authenticated By: Jeronimo Greaves, M.D.     Microbiology: Recent Results (from the past 240 hour(s))  CULTURE, BLOOD (ROUTINE X 2)     Status: None   Collection Time    12/12/12  9:07 PM      Result Value Range Status   Specimen Description BLOOD RIGHT HAND   Final   Special Requests BOTTLES DRAWN AEROBIC AND ANAEROBIC 10CC   Final   Culture  Setup Time 12/13/2012 02:13   Final   Culture     Final   Value:        BLOOD CULTURE RECEIVED NO GROWTH TO DATE CULTURE WILL BE HELD FOR 5 DAYS BEFORE ISSUING A FINAL NEGATIVE REPORT   Report Status PENDING   Incomplete  CULTURE, BLOOD (ROUTINE X 2)     Status: None   Collection Time    12/12/12  9:07 PM      Result  Value  Range Status   Specimen Description BLOOD RIGHT ARM   Final   Special Requests BOTTLES DRAWN AEROBIC AND ANAEROBIC 10CC   Final   Culture  Setup Time 12/13/2012 02:14   Final   Culture     Final   Value:        BLOOD CULTURE RECEIVED NO GROWTH TO DATE CULTURE WILL BE HELD FOR 5 DAYS BEFORE ISSUING A FINAL NEGATIVE REPORT   Report Status PENDING   Incomplete  CULTURE, EXPECTORATED SPUTUM-ASSESSMENT     Status: None   Collection Time    12/12/12  9:41 PM      Result Value Range Status   Specimen Description SPUTUM   Final   Special Requests Normal   Final   Sputum evaluation     Final   Value: THIS SPECIMEN IS ACCEPTABLE. RESPIRATORY CULTURE REPORT TO FOLLOW.   Report Status 12/13/2012 FINAL   Final  CULTURE, RESPIRATORY (NON-EXPECTORATED)     Status: None   Collection Time    12/12/12  9:41 PM      Result Value Range Status   Specimen Description SPUTUM   Final   Special Requests NONE   Final   Gram Stain     Final   Value: MODERATE WBC PRESENT,BOTH PMN AND MONONUCLEAR     FEW SQUAMOUS EPITHELIAL CELLS PRESENT     FEW GRAM POSITIVE COCCI IN CLUSTERS     IN PAIRS IN CHAINS RARE GRAM POSITIVE RODS   Culture NORMAL OROPHARYNGEAL FLORA   Final   Report Status 12/15/2012 FINAL   Final     Labs: Basic Metabolic Panel:  Recent Labs Lab 12/12/12 1538 12/12/12 2106 12/13/12 0500 12/14/12 0515 12/15/12 1655 12/16/12 0625  NA 136  --  133* 136 136  --   K 2.5*  --  3.2* 3.5 3.9  --   CL 93*  --  92* 99 95*  --   CO2 23  --  21 21 23   --   GLUCOSE 194*  --  424* 348* 307*  --   BUN 24*  --  28* 35* 34*  --   CREATININE 1.32 1.41* 1.48* 1.57* 1.37*  --   CALCIUM 6.0*  --  5.9* 6.2* 6.9*  --   MG  --   --   --   --   --  1.3*  PHOS  --   --   --   --   --  3.2   Liver Function Tests:  Recent Labs Lab 12/12/12 1538 12/13/12 0500  AST 21 16  ALT 17 17  ALKPHOS 85 91  BILITOT 0.4 0.4  PROT 6.8 6.9  ALBUMIN 2.6* 2.7*   No results found for this basename: LIPASE,  AMYLASE,  in the last 168 hours No results found for this basename: AMMONIA,  in the last 168 hours CBC:  Recent Labs Lab 12/12/12 1538 12/12/12 2106 12/13/12 0500  WBC 23.0* 23.3* 21.4*  NEUTROABS 21.7*  --  20.7*  HGB 11.8* 11.5* 10.8*  HCT 33.2* 33.5* 31.4*  MCV 85.8 87.0 84.4  PLT 334 328 297   Cardiac Enzymes:  Recent Labs Lab 12/12/12 1543  TROPONINI <0.30   BNP: BNP (last 3 results)  Recent Labs  04/03/12 0550 04/23/12 1315 12/12/12 1543  PROBNP 368.7* 501.5* 787.2*   CBG:  Recent Labs Lab 12/15/12 0551 12/15/12 1128 12/15/12 1615 12/15/12 2149 12/16/12 0614  GLUCAP 303* 378* 308* 240* 127*  SignedRhetta Mura  [facility] 12/16/2012, 8:32 AM

## 2012-12-16 NOTE — Progress Notes (Signed)
Dr Mahala Menghini o2 sat on 4 l85 w/o apparent  distress  T order o2 4l [place]/m pt and spouise aware

## 2012-12-16 NOTE — Progress Notes (Signed)
1100 discharge instructions and prescriptions given to pt and spouse . Both verbaliized understading understanding . Went ihome  spouse via wheelchair  whheled by Agricultural consultant to lobby

## 2012-12-19 LAB — CULTURE, BLOOD (ROUTINE X 2): Culture: NO GROWTH

## 2012-12-26 ENCOUNTER — Telehealth: Payer: Self-pay | Admitting: *Deleted

## 2012-12-26 ENCOUNTER — Encounter: Payer: Self-pay | Admitting: Adult Health

## 2012-12-26 ENCOUNTER — Other Ambulatory Visit (INDEPENDENT_AMBULATORY_CARE_PROVIDER_SITE_OTHER): Payer: Managed Care, Other (non HMO)

## 2012-12-26 ENCOUNTER — Ambulatory Visit (INDEPENDENT_AMBULATORY_CARE_PROVIDER_SITE_OTHER)
Admission: RE | Admit: 2012-12-26 | Discharge: 2012-12-26 | Disposition: A | Discharge status: Home / Self Care | Payer: Managed Care, Other (non HMO) | Source: Ambulatory Visit | Attending: Adult Health | Admitting: Adult Health

## 2012-12-26 ENCOUNTER — Ambulatory Visit (INDEPENDENT_AMBULATORY_CARE_PROVIDER_SITE_OTHER): Payer: Managed Care, Other (non HMO) | Admitting: Adult Health

## 2012-12-26 VITALS — BP 140/80 | HR 88 | Temp 98.8°F | Ht 71.0 in | Wt 287.6 lb

## 2012-12-26 DIAGNOSIS — J441 Chronic obstructive pulmonary disease with (acute) exacerbation: Secondary | ICD-10-CM

## 2012-12-26 DIAGNOSIS — D72829 Elevated white blood cell count, unspecified: Secondary | ICD-10-CM | POA: Insufficient documentation

## 2012-12-26 LAB — BASIC METABOLIC PANEL
CO2: 31 mEq/L (ref 19–32)
Calcium: 8.5 mg/dL (ref 8.4–10.5)
Creatinine, Ser: 1.6 mg/dL — ABNORMAL HIGH (ref 0.4–1.5)
GFR: 47.35 mL/min — ABNORMAL LOW (ref >60.00)
Glucose, Bld: 99 mg/dL (ref 70–99)

## 2012-12-26 LAB — BRAIN NATRIURETIC PEPTIDE: Pro B Natriuretic peptide (BNP): 209 pg/mL — ABNORMAL HIGH (ref 0.0–100.0)

## 2012-12-26 LAB — CBC
MCHC: 32.9 g/dL (ref 30.0–36.0)
MCV: 88.4 fl (ref 78.0–100.0)
RDW: 16.1 % — ABNORMAL HIGH (ref 11.5–14.6)

## 2012-12-26 MED ORDER — BUDESONIDE-FORMOTEROL FUMARATE 160-4.5 MCG/ACT IN AERO: 2.0000 | INHALATION_SPRAY | Freq: Two times a day (BID) | RESPIRATORY_TRACT | Status: DC

## 2012-12-26 MED ORDER — BECLOMETHASONE DIPROPIONATE 80 MCG/ACT IN AERS: 2.0000 | INHALATION_SPRAY | Freq: Two times a day (BID) | RESPIRATORY_TRACT | Status: DC

## 2012-12-26 MED ORDER — FUROSEMIDE 20 MG PO TABS: 40.0000 mg | ORAL_TABLET | Freq: Every day | ORAL | Status: DC

## 2012-12-26 MED ORDER — PREDNISONE 10 MG PO TABS: ORAL_TABLET | ORAL | Status: DC

## 2012-12-26 NOTE — Patient Instructions (Addendum)
Begin Lasix 20mg  2 tabs daily x 2 days .  Low salt diet  Keep legs elevated.  Stop QVAR  Begin Symbicort 160/4.54mcg 2 puffs Twice daily   Stop smoking .  Continue on Oxygen and BIPAP as directed.  I will call with xray and labs  Decrease Prednisone by 10mg  daily every 3 days  50mg  x 3 days , 40mg  x 3d , 30mg  x 3 d , 20mg  x 3 d , 10mg  x 3 d, 5mg  x 3 d , then stop.  follow up Dr. Marchelle Gearing in 3 weeks and As needed

## 2012-12-26 NOTE — Assessment & Plan Note (Addendum)
Recent COPD exacerbation discharged on high dose steroids  Will need to get off steroids if possible  His BS are out of control .  Hx of low magnesium - will need repeat labs.  Appears to have some volume overload today w/ wt up 12lbs and LE edema - suspect secondary to high dose steroids   Plan  Begin Lasix 20mg  2 tabs daily x 2 days .  Low salt diet  Keep legs elevated.  Stop QVAR  Begin Symbicort 160/4.11mcg 2 puffs Twice daily   Stop smoking .  Continue on Oxygen and BIPAP as directed.  I will call with xray and labs  Decrease Prednisone by 10mg  daily every 3 days  50mg  x 3 days , 40mg  x 3d , 30mg  x 3 d , 20mg  x 3 d , 10mg  x 3 d, 5mg  x 3 d , then stop.  follow up Dr. Marchelle Gearing in 3 weeks and As needed

## 2012-12-26 NOTE — Addendum Note (Signed)
Addended by: Nita Sells on: 12/26/2012 11:14 AM   Modules accepted: Orders

## 2012-12-26 NOTE — Telephone Encounter (Signed)
Jennifer---see Pts labs for 12/26/12...and notes per TP.  Pt to have repeat CBC upon return to see MR 01/14/13.  Message to be sent to [facility] as Lorain Childes.

## 2012-12-26 NOTE — Progress Notes (Signed)
Subjective:    Patient ID: [name], male    DOB: May 10, 1951, 62 y.o.   MRN: [id number]  HPI 1. tobacco abuse (wellbutrin intolerance hx - insomnia in early 2011, chantix intolerance )  - quit Nov 2012 after admission for pna - relapsed Jan/FEb 2013. Quit May 2013 after lobectomy for cancer - e-cigs as of Aug and Oct 2013  2A. 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 AECOPD - Nov 2012 admit for pneumonia  - Jan 2013 OPD Rx  - March 2013 - presumed opd Rx  3. Mediastinal nodes Stable Oct 2009 - July 2011; no further fu  4.RUL pulmonary nodule  - 5mm July 2011 -> sept 2012,  unchanged  - new 1.4 cm cavity and 9mm nodule RUL - Feb 2013,  - May 2013  - 1.9cm T1a, N0, M0  Stage 1A NSCLC with 0.3cm carcinoid - s/p lobectomy   5. Obesity/osa on cpap and cough  Body mass index is 42.40 kg/(m^2). on 12/21/2011 Body mass index is 42.40 kg/(m^2). on 12/21/2011  6. RLL pulmonary scarring  Stable CT 2008 -> 2011; no further fu  7. CAD - s/p stent  8. Hx of renal failure and persistent hypomagnesmia  - onset May 2013 following lobectomy -> renal failure resolved  - Oct 2013 - persistent hypomag being followed by Dr Lajuana Carry      OV 08/21/2012   FU - COPD - baseline symptoms only. CAT score is 15 (see below). Feels well. Will have flu shot today 08/21/2012  - Smoker - has quit conventional cigs but doing e-cigs since dc from lung cancer surgery. Buys it at [employer]. Pack a week. Does not realize that is bad. He says he thought it was good for him   - Low mag - seeing Dr Daphine Deutscher and is on heavy replacement. Says levels holding. Reviewed Dr Marland Mcalpine recent note who concurst that cause of hypomag is  puzzling  -  Lung cancer- saw Dr Laneta Simmers last month. CXR ok. Has ct scan upcoming in few months   - new issue: - PReop pulmonary clearance - says he has no idea he has a hiatal hernia. NEver seen a Dr Michaell Cowing. He is puzzled he needs preop eval. However, he does have RUQ ventral hernia and is now intrigued about surgery for it becuaes he has pain in the RUQ area. Upon review of EMR, there is no record he has seen [facility] Dr Michaell Cowing but I did receive a note asking for surgical clearance but I suspect it was another patient of same name    CAT COPD Symptom & Quality of Life Score (GSK trademark) 0 is no burden. 5 is highest burden 08/21/2012   Never Cough -> Cough all the time 2  No phlegm in chest -> Chest is full of phlegm 1  No chest tightness -> Chest feels very tight 1  No dyspnea for 1 flight stairs/hill -> Very dyspneic for 1 flight of stairs 2  No limitations for ADL at home -> Very limited with ADL at home 2  Confident leaving home -> Not at all confident leaving home 2  Sleep soundly -> Do not sleep soundly because  of lung condition 3  Lots of Energy -> No energy at all 2  TOTAL Score (max 40)  15   12/26/2012 Post Hospital follow up  Admitted 2/7-11 for acute resp failure w/ COPD exacerbation . tx w/ abx and steroids . Sputum cx neg.  Discharged on prednisone 60mg  daily .  BS have been very high.  Neg flu panel .  Still smoking  Since discharge  Leg swelling  Weight is up 12 lbs.   CT chest 10/2012 no evidence of recurrence.  No hemoptysis , chest pain or orthopnea      Review of Systems  Constitutional:   No  weight loss, night sweats,  Fevers, chills, +fatigue, or  lassitude.  HEENT:   No headaches,  Difficulty swallowing,  Tooth/dental problems, or  Sore throat,                No sneezing, itching, ear ache,  +nasal congestion, post nasal drip,   CV:  No chest pain,  Orthopnea, PND, swelling in lower extremities, anasarca, dizziness, palpitations, syncope.   GI  No  heartburn, indigestion, abdominal pain, nausea, vomiting, diarrhea, change in bowel habits, loss of appetite, bloody stools.   Resp:   No coughing up of blood.  No change in color of mucus.   No chest wall deformity  Skin: no rash or lesions.  GU: no dysuria, change in color of urine, no urgency or frequency.  No flank pain, no hematuria   MS:  No joint pain or swelling.  No decreased range of motion.  No back pain.  Psych:  No change in mood or affect. No depression or anxiety.  No memory loss.          Objective:   Physical Exam GEN: A/Ox3; pleasant , NAD, obese   HEENT:  /AT,  EACs-clear, TMs-wnl, NOSE-clear, THROAT-clear, no lesions, no postnasal drip or exudate noted.   NECK:  Supple w/ fair ROM; no JVD; normal carotid impulses w/o bruits; no thyromegaly or nodules palpated; no lymphadenopathy.  RESP  Few rhonchi , no dullness to percussion    CARD:  RRR, no m/r/g  , 1+peripheral edema, pulses intact, no cyanosis or clubbing.  GI:   Soft & nt; nml bowel sounds; no organomegaly or masses detected.  Musco: Warm bil, no deformities or joint swelling noted.   Neuro: alert, no focal deficits noted.    Skin: Warm, no lesions or rashes         Assessment & Plan:

## 2012-12-29 ENCOUNTER — Telehealth: Payer: Self-pay | Admitting: Internal Medicine

## 2012-12-29 NOTE — Telephone Encounter (Signed)
Pt was seen by TP:  Notes Recorded by Julio Sicks, NP on 12/26/2012 at 4:28 PM Xray is improved  Will need follow up on return ov to document clearance of PNA  Please contact office for sooner follow up if symptoms do not improve or worsen or seek emergency care    Notes Recorded by Julio Sicks, NP on 12/26/2012 at 4:37 PM Chart review shows persistent leukocytosis  WBC elevated for ~1year.? Etiology - has this been worked up.  Send labs to PCP  Pt will need repeat CBC w/ diff on return , if remains elevated may need referral to hematology if not workup   Pt aware of results; aware of next OV with MR on 01/14/13; can repeat needed labs and xray then. Pt has never been worked up to see etiology of persistent leukocytosis. Boone Master is aware and will speak to TP about this.

## 2012-12-29 NOTE — Progress Notes (Signed)
Quick Note:  Pt aware per 2.24.14 phone note. ______

## 2012-12-29 NOTE — Telephone Encounter (Signed)
Pt returned call. [phone]. [name]

## 2012-12-29 NOTE — Progress Notes (Signed)
Quick Note:  Pt aware per 2.24.14 phone note. ______ 

## 2012-12-30 NOTE — Telephone Encounter (Signed)
Noted, see phone note from 12-29-12. Carron Curie, CMA

## 2013-01-14 ENCOUNTER — Ambulatory Visit (INDEPENDENT_AMBULATORY_CARE_PROVIDER_SITE_OTHER)
Admission: RE | Admit: 2013-01-14 | Discharge: 2013-01-14 | Disposition: A | Discharge status: Home / Self Care | Payer: Managed Care, Other (non HMO) | Source: Ambulatory Visit | Attending: Internal Medicine | Admitting: Internal Medicine

## 2013-01-14 ENCOUNTER — Other Ambulatory Visit (INDEPENDENT_AMBULATORY_CARE_PROVIDER_SITE_OTHER): Payer: Managed Care, Other (non HMO)

## 2013-01-14 ENCOUNTER — Ambulatory Visit (INDEPENDENT_AMBULATORY_CARE_PROVIDER_SITE_OTHER): Payer: Managed Care, Other (non HMO) | Admitting: Internal Medicine

## 2013-01-14 ENCOUNTER — Encounter: Payer: Self-pay | Admitting: Internal Medicine

## 2013-01-14 DIAGNOSIS — F17200 Nicotine dependence, unspecified, uncomplicated: Secondary | ICD-10-CM

## 2013-01-14 DIAGNOSIS — C3490 Malignant neoplasm of unspecified part of unspecified bronchus or lung: Secondary | ICD-10-CM

## 2013-01-14 DIAGNOSIS — C3491 Malignant neoplasm of unspecified part of right bronchus or lung: Secondary | ICD-10-CM

## 2013-01-14 DIAGNOSIS — J189 Pneumonia, unspecified organism: Secondary | ICD-10-CM

## 2013-01-14 DIAGNOSIS — D72829 Elevated white blood cell count, unspecified: Secondary | ICD-10-CM

## 2013-01-14 DIAGNOSIS — J449 Chronic obstructive pulmonary disease, unspecified: Secondary | ICD-10-CM

## 2013-01-14 LAB — CBC
HCT: 31.3 % — ABNORMAL LOW (ref 39.0–52.0)
MCV: 89.6 fl (ref 78.0–100.0)
RDW: 16.6 % — ABNORMAL HIGH (ref 11.5–14.6)

## 2013-01-14 NOTE — Assessment & Plan Note (Signed)
I gently reminded him about the need to quit. He verbalized understanding but is struggling with his addiction

## 2013-01-14 NOTE — Assessment & Plan Note (Signed)
I will recheck low magnesium today. I've offered him the opportunity to talk to Dr. Hyman Hopes about a second opinion because we are still puzzled as to why his magnesium continues to be persistently low

## 2013-01-14 NOTE — Assessment & Plan Note (Signed)
Recheck CBC. 

## 2013-01-14 NOTE — Assessment & Plan Note (Signed)
Status post lobectomy May 2013. No evidence of recurrence on CT scan of the chest December 2013 he is going to followup with Dr. Lavinia Sharps in May 2014 and will have a one-year surveillance scan at that time with Dr. Lavinia Sharps

## 2013-01-14 NOTE — Assessment & Plan Note (Signed)
Clinically stable with a COPD cat score of 22.  Plan Continue medications and oxygen I've advised N. acetylcysteine to prevent COPD exacerbations

## 2013-01-14 NOTE — Patient Instructions (Addendum)
#  COPD - This appears stable - Continue your oxygen and nebulizers/inhalers as before - Start N. acetylcysteine 600 mg twice a day  - [place] study published in Lancet [place] Journal in 2014 shows it helps   - this is not to make you feel better but to to prevent recurrent bronchitis episodes  - You are more likely to get this drug at [facility] or a vitamin store then at [employer] or [employer] or [employer]  - You can also get this at website Constitution[employer].co.uk  - It functions as an anti-oxidant and there are minimal/none side effects   # recent pneumonia - Please have chest x-ray to ensure that pneumonia from a month ago is getting up  #Tingling in hands and abnormal electrolytes - Check blood test today for urine electrolytes  #Followup - I will call you with results - 3 months or sooner as needed

## 2013-01-14 NOTE — Progress Notes (Signed)
Subjective:    Patient ID: [name], male    DOB: [birth year], 62 y.o.   MRN: [id number]  HPI HPI 1. tobacco abuse (wellbutrin intolerance hx - insomnia in early 2011, chantix intolerance )  - quit Nov 2012 after admission for pna - relapsed Jan/FEb 2013. Quit May 2013 after lobectomy for cancer - e-cigs as of Aug and Oct 2013  2A. 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 AECOPD - Nov 2012 admit for pneumonia  - Jan 2013 OPD Rx  - March 2013 - presumed opd Rx - February 2014-admission for COPD exacerbation  3. 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 Obesity/osa on cpap and cough  Body mass index is 42.40 kg/(m^2). on 12/21/2011 Body mass index is 39.35 kg/(m^2). on 01/14/2013    6. RLL pulmonary scarring  Stable CT 2008 -> 2011; no further fu  7. CAD - s/p stent  8. Hx of renal failure and persistent hypomagnesmia  - onset May 2013 following lobectomy -> renal failure resolved  - Oct 2013 - persistent hypomag being followed by Dr Lajuana Carry    9. persistent unexplained leukocytosis   - 09/27/2012: White count 20,000  - 12/12/2012: White count 22,000   - 12/26/2012: White count 19,500   12/26/2012 Post Hospital follow up  Admitted 2/7-11 for acute resp failure w/ COPD exacerbation . tx w/ abx and steroids . Sputum cx neg.  Discharged on prednisone 60mg  daily .  BS have been  very high.  Neg flu panel .  Still smoking  Since discharge  Leg swelling  Weight is up 12 lbs.   CT chest 10/2012 no evidence of recurrence.  No hemoptysis , chest pain or orthopnea     Begin Lasix 20mg  2 tabs daily x 2 days .  Low salt diet  Keep legs elevated.  Stop QVAR  Begin Symbicort 160/4.6mcg 2 puffs Twice daily  Stop smoking .  Continue on Oxygen and BIPAP as directed.  I will call with xray and labs  Decrease Prednisone by 10mg  daily every 3 days  50mg  x 3 days , 40mg  x 3d , 30mg  x 3 d , 20mg  x 3 d , 10mg  x 3 d, 5mg  x 3 d , then stop.  follow up Dr. Marchelle Gearing in 3 weeks and As needed   Office visit 01/14/2013:  Followup for above issues  - Recent acute exacerbation COPD: He says that after seeing nurse practitioner Rikki Spearing he's actually better. Currently denies COPD exacerbation symptoms.   - Baseline COPD: His COPD cat score is 22 and although this is worse than October 2013 he feels this is an improvement compared to his recent admission and even compared to followup several weeks ago with nurse practitioner. He continues with oxygen and BiPAP and his  inhalers.  - In terms of smoking: He smells of tobacco but he says he only smokes an occasional cigarette. He feels guilty because of the smoking I suspect  - Abnormal electrolytes: His possible and to place but is persistent low magnesium . He reports paresthesias in his hands but has not had a calcium check   - Leukocytosis: He wants to know if his leukocytosis this is still persistent  - Lung cancer: He has an upcoming CT scan of the chest in May 2014 which will Mark 1 year anniversary since his lung cancer surgery. But on chest x-ray February 2014 there is no recurrence   CAT COPD Symptom & Quality of Life Score (GSK trademark) 0 is no burden. 5 is highest burden 08/21/2012  01/14/2013   Never Cough -> Cough all the time 2 3  No phlegm in chest -> Chest is full of phlegm 1 3  No chest tightness ->  Chest feels very tight 1 3  No dyspnea for 1 flight stairs/hill -> Very dyspneic for 1 flight of stairs 2 4  No limitations for ADL at home -> Very limited with ADL at home 2 2  Confident leaving home -> Not at all confident leaving home 2 3  Sleep soundly -> Do not sleep soundly because of lung condition 3 5  Lots of Energy -> No energy at all 2 2  TOTAL Score (max 40)  15 22        Review of Systems  Constitutional: Negative for fever and unexpected weight change.  HENT: Negative for ear pain, nosebleeds, congestion, sore throat, rhinorrhea, sneezing, trouble swallowing, dental problem, postnasal drip and sinus pressure.   Eyes: Negative for redness and itching.  Respiratory: Negative for cough, chest tightness, shortness of breath and wheezing.   Cardiovascular: Negative for palpitations and leg swelling.  Gastrointestinal: Negative for nausea and vomiting.  Genitourinary: Negative for dysuria.  Musculoskeletal: Negative for joint swelling.  Skin: Negative for rash.  Neurological: Negative for headaches.  Hematological: Does not bruise/bleed easily.  Psychiatric/Behavioral: Negative for dysphoric mood. The patient is not nervous/anxious.        Objective:   Physical Exam GEN: A/Ox3; pleasant , NAD, obese   HEENT:  [place]/AT,  EACs-clear, TMs-wnl, NOSE-clear, THROAT-clear, no lesions, no postnasal drip or exudate noted.   NECK:  Supple w/ fair ROM; no JVD; normal carotid impulses w/o bruits; no thyromegaly or nodules palpated; no lymphadenopathy.  RESP  Few rhonchi , no dullness to percussion    CARD:  RRR, no m/r/g  , 1+peripheral edema, pulses intact, no cyanosis or clubbing.  GI:   Soft & nt; nml bowel sounds; no organomegaly or masses detected.  Musco: Warm bil, no deformities or joint swelling noted.   Neuro: alert, no focal deficits noted.    Skin: Warm, no lesions or rashes          Assessment & Plan:

## 2013-01-15 LAB — BASIC METABOLIC PANEL
Calcium: 7.8 mg/dL — ABNORMAL LOW (ref 8.4–10.5)
GFR: 47.34 mL/min — ABNORMAL LOW (ref >60.00)
Glucose, Bld: 202 mg/dL — ABNORMAL HIGH (ref 70–99)
Sodium: 142 mEq/L (ref 135–145)

## 2013-01-15 LAB — CALCIUM: Calcium: 7.8 mg/dL — ABNORMAL LOW (ref 8.4–10.5)

## 2013-01-16 ENCOUNTER — Encounter: Payer: Self-pay | Admitting: Family Medicine

## 2013-01-16 ENCOUNTER — Telehealth: Payer: Self-pay | Admitting: Internal Medicine

## 2013-01-16 NOTE — Telephone Encounter (Signed)
Spoke with pt's spouse She states calling for lab results from 01/14/13  Also wants to let MR know that the pt is taking 250 mg magnesium bid Please advise on labs, thanks

## 2013-01-16 NOTE — Telephone Encounter (Signed)
LM for Mrs. Fenster to call back.

## 2013-01-16 NOTE — Telephone Encounter (Signed)
  Recent Labs Lab 01/14/13 1707  NA 142  K 4.2  CL 103  CO2 28  GLUCOSE 202*  BUN 19  CREATININE 1.6*  CALCIUM 7.8*  7.8*  MG 1.1*    Recent Labs Lab 01/14/13 1707  HGB 10.4*  HCT 31.3*  WBC 12.4*  PLT 192.0    Low MAG And slighly low calcium WC improving Mild stable anemia    He needs to double up his magnesium oxide from 250mg  bid to 500mg  bid  He needs to call Dr Hyman Hopes on Monday 01/19/13 an dmake  Quick apt to sort out mag and calcium issues  Also, I need to know how many BMs he has in a day?   Dr. Kalman Shan, M.D., [facility].C.P Pulmonary and Critical Care Medicine Staff Physician [facility] Pager: [phone], If no answer or between  15:00h - 7:00h: call [phone]  01/16/2013 5:07 PM

## 2013-01-16 NOTE — Telephone Encounter (Signed)
See my response below. You are sending me this message after I sent this message to triage

## 2013-01-19 NOTE — Telephone Encounter (Signed)
Pt's spouse called back stating she is calling Crystal back to answer a few questions.  [phone]. Leanora Ivanoff

## 2013-01-19 NOTE — Telephone Encounter (Signed)
Pt is aware of MR recommendation.

## 2013-01-19 NOTE — Telephone Encounter (Signed)
wc tredning down, I think we can watch it. Do another CBC in a few weeks either with [place] or with Dr Hyman Hopes  If they can see Dr Hyman Hopes earlier would be great but if he is feeling ok and they cannot get him in earlier, wait till 02/05/13

## 2013-01-19 NOTE — Telephone Encounter (Signed)
Pt's spouse returned call.  She can be reached @ [phone]. [name]

## 2013-01-19 NOTE — Telephone Encounter (Signed)
Called, spoke with pt's wife.  Informed her of below lab results and recs per MR.  She will have pt increased mag oxide to 500 mg bid.  She is unsure how may BMs pt is having in a day but will call to find this out and will let us know.  States pt does already have an OV with Dr. Hyman Hopes on April 3.  She will call his office this am to see if pt can get in sooner.  If not, she would like to know if April 3rd appt will be to far out.  Also, she stated that it was mentioned to her that pt may need to see a hematology/oncology dr d/t his WC levels and anemia.  She would like to know if MR would be willing to order this or if he prefers Dr. Hyman Hopes do this.  If MR ok with it, she is requesting Dr. Myna Hidalgo as she has seen him in the past and really liked him.  MR, pls advise.  Thank you.

## 2013-01-23 ENCOUNTER — Ambulatory Visit (INDEPENDENT_AMBULATORY_CARE_PROVIDER_SITE_OTHER): Payer: Managed Care, Other (non HMO) | Admitting: Family Medicine

## 2013-01-23 ENCOUNTER — Encounter: Payer: Self-pay | Admitting: Family Medicine

## 2013-01-23 VITALS — BP 140/68 | HR 100 | Temp 98.7°F | Resp 20 | Wt 281.0 lb

## 2013-01-23 DIAGNOSIS — R55 Syncope and collapse: Secondary | ICD-10-CM

## 2013-01-23 LAB — BASIC METABOLIC PANEL
BUN: 17 mg/dL (ref 6–23)
Calcium: 7.2 mg/dL — ABNORMAL LOW (ref 8.4–10.5)
Creat: 1.43 mg/dL — ABNORMAL HIGH (ref 0.50–1.35)
Glucose, Bld: 172 mg/dL — ABNORMAL HIGH (ref 70–99)
Potassium: 3.8 mEq/L (ref 3.5–5.3)

## 2013-01-23 LAB — CBC WITH DIFFERENTIAL/PLATELET
Basophils Absolute: 0 10*3/uL (ref 0.0–0.1)
Basophils Relative: 0 % (ref 0–1)
Eosinophils Absolute: 0.1 10*3/uL (ref 0.0–0.7)
HCT: 31.8 % — ABNORMAL LOW (ref 39.0–52.0)
MCH: 29.2 pg (ref 26.0–34.0)
MCHC: 33.6 g/dL (ref 30.0–36.0)
Monocytes Absolute: 0.5 10*3/uL (ref 0.1–1.0)
Neutro Abs: 7.1 10*3/uL (ref 1.7–7.7)
Neutrophils Relative %: 81 % — ABNORMAL HIGH (ref 43–77)
RDW: 16 % — ABNORMAL HIGH (ref 11.5–15.5)

## 2013-01-23 LAB — HEPATIC FUNCTION PANEL
AST: 10 U/L (ref 0–37)
Albumin: 3.5 g/dL (ref 3.5–5.2)
Bilirubin, Direct: 0.1 mg/dL (ref 0.0–0.3)
Total Bilirubin: 0.4 mg/dL (ref 0.3–1.2)

## 2013-01-23 LAB — T4, FREE: Free T4: 1 ng/dL (ref 0.80–1.80)

## 2013-01-23 LAB — TSH: TSH: 1.777 u[IU]/mL (ref 0.350–4.500)

## 2013-01-23 LAB — T3, FREE: T3, Free: 2.8 pg/mL (ref 2.3–4.2)

## 2013-01-23 MED ORDER — FLUOXETINE HCL 40 MG PO CAPS: 40.0000 mg | ORAL_CAPSULE | Freq: Every day | ORAL | Status: DC

## 2013-01-23 NOTE — Progress Notes (Signed)
Subjective:     Patient ID: [name], male   DOB: 1951-08-01, 62 y.o.   MRN: [id number]  HPI Patient is a 62 year old white male with severe COPD who was admitted in February due to a COPD exacerbation.  He was treated with IV corticosteroids, nebulizer therapy, antibiotics.  Discharge from the hospital on a prolonged steroid taper the concluded after 3 weeks.  States his breathing is back to normal.  He is currently using a single core twice a day, cerebral 1 today, and albuterol nebs twice a day.   However I am concerned because he is reporting syncopal episodes.  States he feels palpitations in his chest.  He then has a sudden drop attack.  He denies any shortness of breath beyond his baseline.  He does have some chest pain which seemed more muscular in origin as it is sore to the touch and is worsened by taking deep breaths and coughing.  He's been seen by Dr. Verdis Prime in the past cardiology for the syncope.  Last office visit was 3 months ago with Dr. Katrinka Blazing.  However he states that these drop attacks become more frequent since being discharge in the hospital. Past Medical History  Diagnosis Date  . Anemia   . Allergy   . Thyroid disease   . Parathyroid disease   . Headache   . Hypertension     takes  HYzaar daily  . Coronary artery disease     has 1 stent  . Myocardial infarct   . Asthma   . Emphysema     sees Dr.Ramaswami for this  . COPD (chronic obstructive pulmonary disease)     uses Albuterol and Spiriva daily  . Shortness of breath     with exertion   . Bronchitis   . Productive cough     white in color but no odor  . Sleep apnea     uses BiPaP  . Peripheral neuropathy   . Lung mass     right upper lobe  . Back pain     4 deteriorating disc and receives an injection q3-78mon;has been doing this for about 37yrs  . H/O hiatal hernia   . GERD (gastroesophageal reflux disease)     takes Prilosec daily  . Blood transfusion     as a child  . Glaucoma(365)     hx  of  . Depression     takes Prozac daily  . Insomnia     takes Trazodone nightly  . Anginal pain   . Chronic kidney disease     acute kidney failure post surgery  . Pneumonia     hx of' 62 yo,rd' last time in 2012  . Type II diabetes mellitus     takes Metformin bid and Novolog and Lantus daily  . Arthritis     "hands"   Current Outpatient Prescriptions on File Prior to Visit  Medication Sig Dispense Refill  . acetaminophen (TYLENOL) 500 MG tablet Take 1,000 mg by mouth every 6 (six) hours as needed. For pain      . albuterol (PROVENTIL) (2.5 MG/3ML) 0.083% nebulizer solution Take 3 mLs (2.5 mg total) by nebulization every 4 (four) hours as needed. For shortness of breath  75 mL  10  . aspirin EC 81 MG tablet Take 81 mg by mouth daily.      Marland Kitchen atorvastatin (LIPITOR) 20 MG tablet Take 20 mg by mouth daily.      . budesonide-formoterol (SYMBICORT) 160-4.5  MCG/ACT inhaler Inhale 2 puffs into the lungs 2 (two) times daily.  1 Inhaler  12  . Calcium Carbonate-Vitamin D (CALCIUM 600/VITAMIN D PO) Take 1 tablet by mouth daily.      Marland Kitchen diltiazem (CARDIZEM CD) 120 MG 24 hr capsule Take 120 mg by mouth daily.      Marland Kitchen FLUoxetine (PROZAC) 40 MG capsule Take 40 mg by mouth daily.        Marland Kitchen gabapentin (NEURONTIN) 300 MG capsule Take 300 mg by mouth daily.       . insulin aspart (NOVOLOG) 100 UNIT/ML injection Inject 10-25 Units into the skin 3 (three) times daily before meals. CBG < 70: implement hypoglycemia protocol CBG 70 - 120: 0 units CBG 121 - 150: 5 units CBG 151 - 200: 10 units CBG 201 - 250: 15 units CBG 251 - 300: 20 units CBG 301 - 350: 25 units CBG 351 - 400: 30 units  1 vial    . Magnesium 250 MG TABS Take 500 mg by mouth 2 (two) times daily.       . Multiple Vitamin (MULTIVITAMIN WITH MINERALS) TABS Take 1 tablet by mouth daily.      . pantoprazole (PROTONIX) 40 MG tablet Take 40 mg by mouth daily.      Marland Kitchen thiamine (VITAMIN B-1) 100 MG tablet Take 100 mg by mouth daily.      Marland Kitchen  tiotropium (SPIRIVA) 18 MCG inhalation capsule Place 18 mcg into inhaler and inhale daily.        . traZODone (DESYREL) 100 MG tablet Take 100 mg by mouth at bedtime.         No current facility-administered medications on file prior to visit.     Review of Systems  Constitutional: Negative.   HENT: Negative.   Eyes: Negative.   Respiratory: Positive for cough, chest tightness, shortness of breath and wheezing.   Cardiovascular: Positive for palpitations. Negative for chest pain.  Gastrointestinal: Negative.   Genitourinary: Negative.   Neurological: Positive for syncope. Negative for seizures.       Objective:   Physical Exam  Constitutional: He appears well-developed and well-nourished.  HENT:  Head: Normocephalic and atraumatic.  Right Ear: External ear normal.  Left Ear: External ear normal.  Eyes: Conjunctivae and EOM are normal. Pupils are equal, round, and reactive to light.  Neck: Normal range of motion. Neck supple. No thyromegaly present.  Cardiovascular: S1 normal and S2 normal.  An irregular rhythm present. Tachycardia present.   No murmur heard. Pulmonary/Chest: No accessory muscle usage. No respiratory distress. He has decreased breath sounds in the right upper field, the right middle field, the right lower field, the left upper field, the left middle field and the left lower field. He has no wheezes. He has no rhonchi. He has no rales.  Abdominal: Soft. Normal appearance and bowel sounds are normal.       Assessment:     COPD exacerbation Palpitation Syncope     Plan:     I feel is COPD is back to baseline,  to continue using his Symbicort twice a day, spiriva daily, and albuterol when necessary.  He is also to liberalize the use of his oxygen which he is currently just wearing at night.  An EKG was obtained in the office today shows sinus tachycardia 101 beats per minute and a left axis deviation.  However has a normal interval with no sign of long Q-T,  Brugada, or Wolff-Parkinson-White.  I believe what  I am hearing on exam her frequent PVCs and PACs.  Given all adrenergic agents he is taking for his COPD I am concerned about possible arrhythmias causing his drop attacks. I will schedule the patient for a one-month event monitor. See if I can capture some sort of cardiac arrhythmia that warrants intervention.  I will also check a CBC a CMP and a TSH today.

## 2013-01-29 ENCOUNTER — Other Ambulatory Visit: Payer: Self-pay | Admitting: Family Medicine

## 2013-01-29 ENCOUNTER — Encounter (INDEPENDENT_AMBULATORY_CARE_PROVIDER_SITE_OTHER): Payer: Managed Care, Other (non HMO) | Admitting: Family Medicine

## 2013-01-29 DIAGNOSIS — R55 Syncope and collapse: Secondary | ICD-10-CM

## 2013-01-29 DIAGNOSIS — R002 Palpitations: Secondary | ICD-10-CM

## 2013-01-29 NOTE — Progress Notes (Signed)
  This encounter was created in error - please disregard. Pt came for 24 hr heart monitor and 30 day monitor was what provider wanted. Will be scheduled at cardiology provider.

## 2013-02-06 ENCOUNTER — Other Ambulatory Visit: Payer: Self-pay | Admitting: Family Medicine

## 2013-02-23 ENCOUNTER — Encounter: Payer: Self-pay | Admitting: Family Medicine

## 2013-02-23 ENCOUNTER — Ambulatory Visit (INDEPENDENT_AMBULATORY_CARE_PROVIDER_SITE_OTHER): Payer: Managed Care, Other (non HMO) | Admitting: Family Medicine

## 2013-02-23 DIAGNOSIS — R002 Palpitations: Secondary | ICD-10-CM

## 2013-02-23 DIAGNOSIS — H1045 Other chronic allergic conjunctivitis: Secondary | ICD-10-CM

## 2013-02-23 DIAGNOSIS — H1013 Acute atopic conjunctivitis, bilateral: Secondary | ICD-10-CM

## 2013-02-23 LAB — BASIC METABOLIC PANEL
CO2: 27 mEq/L (ref 19–32)
Chloride: 106 mEq/L (ref 96–112)
Potassium: 4.5 mEq/L (ref 3.5–5.3)
Sodium: 143 mEq/L (ref 135–145)

## 2013-02-23 LAB — MAGNESIUM: Magnesium: 1.1 mg/dL — ABNORMAL LOW (ref 1.5–2.5)

## 2013-02-23 MED ORDER — OLOPATADINE HCL 0.1 % OP SOLN: 1.0000 [drp] | Freq: Two times a day (BID) | OPHTHALMIC | Status: DC

## 2013-02-23 NOTE — Progress Notes (Signed)
Subjective:     Patient ID: [name], male   DOB: [birth year], 62 y.o.   MRN: [id number]  HPI  Patient is a 62 year old white male with severe COPD who was admitted in February due to a COPD exacerbation.  He was treated with IV corticosteroids, nebulizer therapy, antibiotics.  Discharge from the hospital on a prolonged steroid taper the concluded after 3 weeks.  States his breathing is back to normal.  He is currently using a single core twice a day, cerebral 1 today, and albuterol nebs twice a day.   However I am concerned because he is reporting syncopal episodes.  States he feels palpitations in his chest.  He then has a sudden drop attack.  He denies any shortness of breath beyond his baseline.  He does have some chest pain which seemed more muscular in origin as it is sore to the touch and is worsened by taking deep breaths and coughing.  He's been seen by Dr. Verdis Prime in the past cardiology for the syncope.  Last office visit was 3 months ago with Dr. Katrinka Blazing.  However he states that these drop attacks become more frequent since being discharge in the hospital.  02/23/13 Therefore after last office visit, and then monitor was ordered. The patient is currently wearing the monitor. He has not had anymore syncopal events.  However his magnesium and calcium continued to remain low even after sufficient supplementation.  He continues to have palpitations. My concern is the palpitations are PVCs it could be due to his multiple emphysema inhalers as well as his low calcium and low magnesium. His TSH was normal..  his medications, the patient is taking protonic 40 mg by mouth daily for GERD.  He states he is taking this for years. He currently denies any symptoms of GERD.  He does report itchy red watery eyes. Past Medical History  Diagnosis Date  . Anemia   . Allergy   . Thyroid disease   . Parathyroid disease   . Headache   . Hypertension     takes  HYzaar daily  . Coronary artery disease      has 1 stent  . Myocardial infarct   . Asthma   . Emphysema     sees Dr.Ramaswami for this  . COPD (chronic obstructive pulmonary disease)     uses Albuterol and Spiriva daily  . Shortness of breath     with exertion   . Bronchitis   . Productive cough     white in color but no odor  . Sleep apnea     uses BiPaP  . Peripheral neuropathy   . Lung mass     right upper lobe  . Back pain     4 deteriorating disc and receives an injection q3-35mon;has been doing this for about 13yrs  . H/O hiatal hernia   . GERD (gastroesophageal reflux disease)     takes Prilosec daily  . Blood transfusion     as a child  . Glaucoma(365)     hx of  . Depression     takes Prozac daily  . Insomnia     takes Trazodone nightly  . Anginal pain   . Chronic kidney disease     acute kidney failure post surgery  . Pneumonia     hx of' 62 yo,rd' last time in 2012  . Type II diabetes mellitus     takes Metformin bid and Novolog and Lantus daily  . Arthritis     "  hands"   Current Outpatient Prescriptions on File Prior to Visit  Medication Sig Dispense Refill  . acetaminophen (TYLENOL) 500 MG tablet Take 1,000 mg by mouth every 6 (six) hours as needed. For pain      . albuterol (PROVENTIL) (2.5 MG/3ML) 0.083% nebulizer solution Take 3 mLs (2.5 mg total) by nebulization every 4 (four) hours as needed. For shortness of breath  75 mL  10  . aspirin EC 81 MG tablet Take 81 mg by mouth daily.      Marland Kitchen atorvastatin (LIPITOR) 20 MG tablet Take 20 mg by mouth daily.      . budesonide-formoterol (SYMBICORT) 160-4.5 MCG/ACT inhaler Inhale 2 puffs into the lungs 2 (two) times daily.  1 Inhaler  12  . Calcium Carbonate-Vitamin D (CALCIUM 600/VITAMIN D PO) Take 1 tablet by mouth daily.      Marland Kitchen diltiazem (CARDIZEM CD) 120 MG 24 hr capsule Take 120 mg by mouth daily.      Marland Kitchen FLUoxetine (PROZAC) 40 MG capsule Take 1 capsule (40 mg total) by mouth daily.  90 capsule  3  . gabapentin (NEURONTIN) 300 MG capsule Take 300 mg  by mouth daily.       . insulin aspart (NOVOLOG) 100 UNIT/ML injection Inject 10-25 Units into the skin 3 (three) times daily before meals. CBG < 70: implement hypoglycemia protocol CBG 70 - 120: 0 units CBG 121 - 150: 5 units CBG 151 - 200: 10 units CBG 201 - 250: 15 units CBG 251 - 300: 20 units CBG 301 - 350: 25 units CBG 351 - 400: 30 units  1 vial    . insulin glargine (LANTUS) 100 UNIT/ML injection Inject 60 Units into the skin every morning.      . Magnesium 250 MG TABS Take 500 mg by mouth 2 (two) times daily.       . Multiple Vitamin (MULTIVITAMIN WITH MINERALS) TABS Take 1 tablet by mouth daily.      . pantoprazole (PROTONIX) 40 MG tablet Take 40 mg by mouth daily.      Marland Kitchen thiamine (VITAMIN B-1) 100 MG tablet Take 100 mg by mouth daily.      Marland Kitchen tiotropium (SPIRIVA) 18 MCG inhalation capsule Place 18 mcg into inhaler and inhale daily.        . traZODone (DESYREL) 100 MG tablet TAKE ONE TABLET BY MOUTH EVERY DAY AT BEDTIME  90 tablet  0   No current facility-administered medications on file prior to visit.     Review of Systems  Constitutional: Negative.   HENT: Negative.   Eyes: Negative.   Respiratory: Positive for cough, chest tightness, shortness of breath and wheezing.   Cardiovascular: Positive for palpitations. Negative for chest pain.  Gastrointestinal: Negative.   Genitourinary: Negative.   Neurological: Positive for syncope. Negative for seizures.       Objective:   Physical Exam  Constitutional: He appears well-developed and well-nourished.  HENT:  Head: Normocephalic and atraumatic.  Right Ear: External ear normal.  Left Ear: External ear normal.  Eyes: EOM are normal. Pupils are equal, round, and reactive to light. Right conjunctiva is injected. Left conjunctiva is injected.  Neck: Normal range of motion. Neck supple. No thyromegaly present.  Cardiovascular: S1 normal and S2 normal.  An irregular rhythm present. Tachycardia present.   No murmur  heard. Pulmonary/Chest: No accessory muscle usage. No respiratory distress. He has decreased breath sounds in the right upper field, the right middle field, the right lower field, the  left upper field, the left middle field and the left lower field. He has no wheezes. He has no rhonchi. He has no rales.  Abdominal: Soft. Normal appearance and bowel sounds are normal.       Assessment:     Hypomagnesemia - Plan: Magnesium  Hypocalcemia - Plan: Magnesium, Basic Metabolic Panel, Vitamin D, 25-hydroxy  Allergic conjunctivitis, bilateral  Palpitations       Plan:     1. Hypomagnesemia Recheck magnesium level today. Asked the patient to discontinue protonic 40 mg by mouth daily. This is likely interfere with his body's ability to observe magnesium. We'll recheck a magnesium level in one month off protonix. - Magnesium  2. Hypocalcemia Check vitamin D level.  The calcium is likely related to his low magnesium.  See plan for problem #1. - Magnesium - Basic Metabolic Panel - Vitamin D, 25-hydroxy  3. Allergic conjunctivitis, bilateral Patanol one drop bilaterally twice a day.  4. Palpitations Await the event monitor results.  His TSH is normal. Meanwhile addressed low magnesium low calcium.

## 2013-02-25 ENCOUNTER — Emergency Department (HOSPITAL_COMMUNITY)
Admission: EM | Admit: 2013-02-25 | Discharge: 2013-02-25 | Disposition: A | Discharge status: Home / Self Care | Payer: Managed Care, Other (non HMO) | Attending: Emergency Medicine | Admitting: Emergency Medicine

## 2013-02-25 ENCOUNTER — Encounter (HOSPITAL_COMMUNITY): Payer: Self-pay | Admitting: Emergency Medicine

## 2013-02-25 DIAGNOSIS — Z8669 Personal history of other diseases of the nervous system and sense organs: Secondary | ICD-10-CM | POA: Insufficient documentation

## 2013-02-25 DIAGNOSIS — F10929 Alcohol use, unspecified with intoxication, unspecified: Secondary | ICD-10-CM

## 2013-02-25 DIAGNOSIS — J4489 Other specified chronic obstructive pulmonary disease: Secondary | ICD-10-CM | POA: Insufficient documentation

## 2013-02-25 DIAGNOSIS — I2510 Atherosclerotic heart disease of native coronary artery without angina pectoris: Secondary | ICD-10-CM | POA: Insufficient documentation

## 2013-02-25 DIAGNOSIS — E1129 Type 2 diabetes mellitus with other diabetic kidney complication: Secondary | ICD-10-CM | POA: Insufficient documentation

## 2013-02-25 DIAGNOSIS — J45909 Unspecified asthma, uncomplicated: Secondary | ICD-10-CM | POA: Insufficient documentation

## 2013-02-25 DIAGNOSIS — Z8719 Personal history of other diseases of the digestive system: Secondary | ICD-10-CM | POA: Insufficient documentation

## 2013-02-25 DIAGNOSIS — F3289 Other specified depressive episodes: Secondary | ICD-10-CM | POA: Insufficient documentation

## 2013-02-25 DIAGNOSIS — Z794 Long term (current) use of insulin: Secondary | ICD-10-CM | POA: Insufficient documentation

## 2013-02-25 DIAGNOSIS — F1010 Alcohol abuse, uncomplicated: Secondary | ICD-10-CM | POA: Insufficient documentation

## 2013-02-25 DIAGNOSIS — G909 Disorder of the autonomic nervous system, unspecified: Secondary | ICD-10-CM | POA: Insufficient documentation

## 2013-02-25 DIAGNOSIS — Z8679 Personal history of other diseases of the circulatory system: Secondary | ICD-10-CM | POA: Insufficient documentation

## 2013-02-25 DIAGNOSIS — IMO0002 Reserved for concepts with insufficient information to code with codable children: Secondary | ICD-10-CM | POA: Insufficient documentation

## 2013-02-25 DIAGNOSIS — Z7982 Long term (current) use of aspirin: Secondary | ICD-10-CM | POA: Insufficient documentation

## 2013-02-25 DIAGNOSIS — F603 Borderline personality disorder: Secondary | ICD-10-CM | POA: Insufficient documentation

## 2013-02-25 DIAGNOSIS — E079 Disorder of thyroid, unspecified: Secondary | ICD-10-CM | POA: Insufficient documentation

## 2013-02-25 DIAGNOSIS — J438 Other emphysema: Secondary | ICD-10-CM | POA: Insufficient documentation

## 2013-02-25 DIAGNOSIS — Z79899 Other long term (current) drug therapy: Secondary | ICD-10-CM | POA: Insufficient documentation

## 2013-02-25 DIAGNOSIS — E1149 Type 2 diabetes mellitus with other diabetic neurological complication: Secondary | ICD-10-CM | POA: Insufficient documentation

## 2013-02-25 DIAGNOSIS — Z8709 Personal history of other diseases of the respiratory system: Secondary | ICD-10-CM | POA: Insufficient documentation

## 2013-02-25 DIAGNOSIS — Z8739 Personal history of other diseases of the musculoskeletal system and connective tissue: Secondary | ICD-10-CM | POA: Insufficient documentation

## 2013-02-25 DIAGNOSIS — N189 Chronic kidney disease, unspecified: Secondary | ICD-10-CM | POA: Insufficient documentation

## 2013-02-25 DIAGNOSIS — J449 Chronic obstructive pulmonary disease, unspecified: Secondary | ICD-10-CM | POA: Insufficient documentation

## 2013-02-25 DIAGNOSIS — Z8701 Personal history of pneumonia (recurrent): Secondary | ICD-10-CM | POA: Insufficient documentation

## 2013-02-25 DIAGNOSIS — K219 Gastro-esophageal reflux disease without esophagitis: Secondary | ICD-10-CM | POA: Insufficient documentation

## 2013-02-25 DIAGNOSIS — I252 Old myocardial infarction: Secondary | ICD-10-CM | POA: Insufficient documentation

## 2013-02-25 DIAGNOSIS — F329 Major depressive disorder, single episode, unspecified: Secondary | ICD-10-CM | POA: Insufficient documentation

## 2013-02-25 DIAGNOSIS — F17200 Nicotine dependence, unspecified, uncomplicated: Secondary | ICD-10-CM | POA: Insufficient documentation

## 2013-02-25 DIAGNOSIS — I129 Hypertensive chronic kidney disease with stage 1 through stage 4 chronic kidney disease, or unspecified chronic kidney disease: Secondary | ICD-10-CM | POA: Insufficient documentation

## 2013-02-25 DIAGNOSIS — Z862 Personal history of diseases of the blood and blood-forming organs and certain disorders involving the immune mechanism: Secondary | ICD-10-CM | POA: Insufficient documentation

## 2013-02-25 DIAGNOSIS — G4700 Insomnia, unspecified: Secondary | ICD-10-CM | POA: Insufficient documentation

## 2013-02-25 LAB — BASIC METABOLIC PANEL
BUN: 21 mg/dL (ref 6–23)
CO2: 28 mEq/L (ref 19–32)
Calcium: 9.6 mg/dL (ref 8.4–10.5)
Chloride: 99 mEq/L (ref 96–112)
Creatinine, Ser: 1.41 mg/dL — ABNORMAL HIGH (ref 0.50–1.35)
GFR calc Af Amer: 60 mL/min — ABNORMAL LOW (ref >90)
GFR calc non Af Amer: 52 mL/min — ABNORMAL LOW (ref >90)
Glucose, Bld: 103 mg/dL — ABNORMAL HIGH (ref 70–99)
Potassium: 4 mEq/L (ref 3.5–5.1)
Sodium: 140 mEq/L (ref 135–145)

## 2013-02-25 LAB — CBC
HCT: 32.5 % — ABNORMAL LOW (ref 39.0–52.0)
Hemoglobin: 11.4 g/dL — ABNORMAL LOW (ref 13.0–17.0)
MCH: 29.9 pg (ref 26.0–34.0)
MCHC: 35.1 g/dL (ref 30.0–36.0)
MCV: 85.3 fL (ref 78.0–100.0)
Platelets: 256 10*3/uL (ref 150–400)
RBC: 3.81 MIL/uL — ABNORMAL LOW (ref 4.22–5.81)
RDW: 15 % (ref 11.5–15.5)
WBC: 11.7 10*3/uL — ABNORMAL HIGH (ref 4.0–10.5)

## 2013-02-25 LAB — ETHANOL: Alcohol, Ethyl (B): 203 mg/dL — ABNORMAL HIGH (ref 0–11)

## 2013-02-25 LAB — POCT I-STAT TROPONIN I
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
## 2013-02-26 NOTE — ED Provider Notes (Signed)
Medical screening examination/treatment/procedure(s) were conducted as a shared visit with non-physician practitioner(s) and myself.  I personally evaluated the patient during the encounter   Loren Racer, MD 02/26/13 2145

## 2013-04-08 ENCOUNTER — Other Ambulatory Visit: Payer: Self-pay

## 2013-04-08 DIAGNOSIS — D381 Neoplasm of uncertain behavior of trachea, bronchus and lung: Secondary | ICD-10-CM

## 2013-04-30 ENCOUNTER — Ambulatory Visit (INDEPENDENT_AMBULATORY_CARE_PROVIDER_SITE_OTHER): Payer: Managed Care, Other (non HMO) | Admitting: Internal Medicine

## 2013-04-30 ENCOUNTER — Other Ambulatory Visit (INDEPENDENT_AMBULATORY_CARE_PROVIDER_SITE_OTHER): Payer: Managed Care, Other (non HMO)

## 2013-04-30 ENCOUNTER — Encounter: Payer: Self-pay | Admitting: Internal Medicine

## 2013-04-30 VITALS — BP 118/78 | HR 89 | Temp 98.0°F | Ht 71.0 in | Wt 274.8 lb

## 2013-04-30 DIAGNOSIS — J441 Chronic obstructive pulmonary disease with (acute) exacerbation: Secondary | ICD-10-CM

## 2013-04-30 DIAGNOSIS — D72829 Elevated white blood cell count, unspecified: Secondary | ICD-10-CM

## 2013-04-30 DIAGNOSIS — Z87448 Personal history of other diseases of urinary system: Secondary | ICD-10-CM

## 2013-04-30 DIAGNOSIS — R569 Unspecified convulsions: Secondary | ICD-10-CM

## 2013-04-30 DIAGNOSIS — N189 Chronic kidney disease, unspecified: Secondary | ICD-10-CM

## 2013-04-30 DIAGNOSIS — J449 Chronic obstructive pulmonary disease, unspecified: Secondary | ICD-10-CM

## 2013-04-30 DIAGNOSIS — J4489 Other specified chronic obstructive pulmonary disease: Secondary | ICD-10-CM

## 2013-04-30 DIAGNOSIS — C3490 Malignant neoplasm of unspecified part of unspecified bronchus or lung: Secondary | ICD-10-CM

## 2013-04-30 LAB — CBC
Hemoglobin: 9.8 g/dL — ABNORMAL LOW (ref 13.0–17.0)
Platelets: 288 10*3/uL (ref 150.0–400.0)
RBC: 3.19 Mil/uL — ABNORMAL LOW (ref 4.22–5.81)
WBC: 11.8 10*3/uL — ABNORMAL HIGH (ref 4.5–10.5)

## 2013-04-30 LAB — PHOSPHORUS: Phosphorus: 4.9 mg/dL — ABNORMAL HIGH (ref 2.3–4.6)

## 2013-04-30 LAB — BASIC METABOLIC PANEL
BUN: 25 mg/dL — ABNORMAL HIGH (ref 6–23)
CO2: 19 mEq/L (ref 19–32)
Chloride: 108 mEq/L (ref 96–112)
Creatinine, Ser: 1.8 mg/dL — ABNORMAL HIGH (ref 0.4–1.5)
Glucose, Bld: 127 mg/dL — ABNORMAL HIGH (ref 70–99)

## 2013-04-30 MED ORDER — TIOTROPIUM BROMIDE MONOHYDRATE 18 MCG IN CAPS: 18.0000 ug | ORAL_CAPSULE | Freq: Every day | RESPIRATORY_TRACT | Status: DC

## 2013-04-30 MED ORDER — PREDNISONE 10 MG PO TABS: ORAL_TABLET | ORAL | Status: DC

## 2013-04-30 MED ORDER — LEVOFLOXACIN 500 MG PO TABS: 500.0000 mg | ORAL_TABLET | Freq: Every day | ORAL | Status: DC

## 2013-04-30 NOTE — Patient Instructions (Addendum)
#  COPD - This appears to be in a flare up - Continue your oxygen and spiriva as before  - Change symbicort to BREO once daily; learn technique -  take levaquin 500mg  once daily  X 6 days - Take prednisone 40 mg daily x 2 days, then 20mg  daily x 2 days, then 10mg  daily x 2 days, then 5mg  daily x 2 days and stop - Start N. acetylcysteine 600 mg twice a day  - [place] study published in Lancet [employer] Journal in 2014 shows it helps   - this is not to make you feel better but to to prevent recurrent bronchitis episodes  - You are more likely to get this drug at [facility] or a vitamin store then at [employer] or [employer] or [employer]  - You can also get this at website [employer]uk  - It functions as an anti-oxidant and there are minimal/none side effects  #Hypomagnesemmia - Check blood test today ; let us see if this problem resolved with stopping protonix  #Leukocytosis  - check cbc today  #Lung cancer  - CT chest with Dr Laneta Simmers next week  #Seizure - referred to DR Anne Hahn neurology  #Smoking  - please quit;  I understand is very hard for you   #Followup - I will call you with results - 6 weeks or  sooner as needed - CAT score at followupo -

## 2013-04-30 NOTE — Progress Notes (Signed)
Subjective:    Patient ID: [name], male    DOB: 1951-06-15, 62 y.o.   MRN: [id number]  HPI 1. tobacco abuse (wellbutrin intolerance hx - insomnia in early 2011, chantix intolerance )  - quit Nov 2012 after admission for pna - relapsed Jan/FEb 2013. Quit May 2013 after lobectomy for cancer - e-cigs as of Aug and Oct 2013 - back to regular cigs - June 2013  2A. 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 AECOPD - Nov 2012 admit for pneumonia  - Jan 2013 OPD Rx  - March 2013 - presumed opd Rx - February 2014-admission for COPD exacerbation  3. Mediastinal nodes Stable Oct 2009 - July 2011; no further fu  4.RUL pulmonary nodule with stage IA non-small cell lung cancer - May 2013  - 5mm July 2011 -> sept 2012,  unchanged  - February 2013: new 1.4 cm cavity and 9mm nodule RUL - -> - May 2013  - 1.9cm T1a, N0, M0  Stage 1A NSCLC with 0.3cm carcinoid - s/p lobectomy - December 2013 CT chest: No recurrence   5. Obesity/osa on cpap and cough  Body mass index is 42.40 kg/(m^2). on 12/21/2011 Body mass index is 39.35 kg/(m^2). on 01/14/2013 Body mass index is 38.34 kg/(m^2). on 04/30/2013      6. RLL pulmonary scarring  Stable CT 2008 -> 2011; no further fu  7. CAD - s/p stent  8. Hx of renal failure and persistent hypomagnesmia  - onset May 2013 following lobectomy -> renal failure resolved  - Oct 2013 - persistent hypomag being followed by Dr Lajuana Carry - April 2014: mag stil lpow -> PCP stopped protonix - June 2014: Normal mag   9. persistent unexplained leukocytosis   - 09/27/2012: White count 20,000  - 12/12/2012: White count 22,000   - 12/26/2012: White count 19,500  - 4/23?14 - White cough: 11,  700   OV 04/30/2013  [place] with wife. Last visit March 2014.  - COPD:  Since then no AECOPD and no admissions but last  3 weeks increased resp symptoims of dyspnea, chest tighness, wheeze, increased sputum volume but no change in sputum color. COPD CAT score worse to 31. Interested in trying BREO  - Smoking; now smoking regular cigs. Cannot give up  - lung cancer: due for fu CT next week; now has completed 1 year anniversary since surgery  - lowMag: has stopped prpotonix based on PCP [name], MD advice. He did not fu for this  - high wbc: improving as of April 2014         CAT COPD Symptom & Quality of Life Score ([employer] trademark) 0 is no burden. 5 is highest burden 08/21/2012  01/14/2013  04/30/2013 aecopd x 3 weekk  Never Cough -> Cough all the time 2 3 4   No phlegm in chest -> Chest is full of phlegm 1 3 4   No chest tightness -> Chest feels very tight 1 3 4   No dyspnea for 1 flight stairs/hill -> Very dyspneic for 1 flight of stairs 2 4 5   No limitations for ADL at home -> Very limited with ADL at home 2 2 3   Confident leaving home ->  Not at all confident leaving home 2 3 3   Sleep soundly -> Do not sleep soundly because of lung condition 3 5 4   Lots of Energy -> No energy at all 2 2 4   TOTAL Score (max 40)  15 22 31    Past, Family, Social reviewed: no change since last visit other than possible seizure episodes at home. THey want neuro refrrral back to Dr Anne Hahn   Review of Systems  Constitutional: Negative for fever and unexpected weight change.  HENT: Negative for ear pain, nosebleeds, congestion, sore throat, rhinorrhea, sneezing, trouble swallowing, dental problem, postnasal drip and sinus pressure.   Eyes: Negative for redness and itching.  Respiratory: Negative for cough, chest tightness, shortness of breath and wheezing.   Cardiovascular: Negative for palpitations and leg swelling.  Gastrointestinal: Negative for nausea and vomiting.  Genitourinary:  Negative for dysuria.  Musculoskeletal: Negative for joint swelling.  Skin: Negative for rash.  Neurological: Negative for headaches.  Hematological: Does not bruise/bleed easily.  Psychiatric/Behavioral: Negative for dysphoric mood. The patient is not nervous/anxious.        Objective:   Physical Exam GEN: A/Ox3; pleasant , NAD, obese   HEENT:  [place]/AT,  EACs-clear, TMs-wnl, NOSE-clear, THROAT-clear, no lesions, no postnasal drip or exudate noted.   NECK:  Supple w/ fair ROM; no JVD; normal carotid impulses w/o bruits; no thyromegaly or nodules palpated; no lymphadenopathy.  RESP  Few rhonchi , no dullness to percussion    CARD:  RRR, no m/r/g  , 1+peripheral edema, pulses intact, no cyanosis or clubbing.  GI:   Soft & nt; nml bowel sounds; no organomegaly or masses detected.  Musco: Warm bil, no deformities or joint swelling noted.   Neuro: alert, no focal deficits noted.    Skin: Warm, no lesions or rashes         Assessment & Plan:

## 2013-05-03 DIAGNOSIS — R569 Unspecified convulsions: Secondary | ICD-10-CM | POA: Insufficient documentation

## 2013-05-03 NOTE — Assessment & Plan Note (Signed)
supposedl had seizures at home. Refer to Dr Anne Hahn neurology

## 2013-05-03 NOTE — Assessment & Plan Note (Signed)
Status post lobectomy May 2013. Has FU ct coming up next week with Dr Laneta Simmers

## 2013-05-03 NOTE — Assessment & Plan Note (Signed)
Developed atn may 2013 following lobectomy. Will recheck today

## 2013-05-03 NOTE — Assessment & Plan Note (Signed)
He is in mild aecopd. Will rx as opd. See copd section

## 2013-05-03 NOTE — Assessment & Plan Note (Addendum)
#  COPD - This appears to be in a flare up - Continue your oxygen and spiriva as before  - Change symbicort to BREO once daily; learn technique -  take levaquin 500mg  once daily  X 6 days - Take prednisone 40 mg daily x 2 days, then 20mg  daily x 2 days, then 10mg  daily x 2 days, then 5mg  daily x 2 days and stop - Start N. acetylcysteine 600 mg twice a day  - [place] study published in Lancet British Journal in 2014 shows it helps   - this is not to make you feel better but to to prevent recurrent bronchitis episodes  - You are more likely to get this drug at [facility] or a vitamin store then at [employer] or [employer] or [employer]  - You can also get this at website [employer]uk  - It functions as an anti-oxidant and there are minimal/none side effects  #Followup - I will call you with results - 6 weeks or  sooner as needed - CAT score at followupo

## 2013-05-03 NOTE — Assessment & Plan Note (Signed)
PMD PICKARD,WARREN TOM, MD has stopped prptonoix but he has not had fu MAg levels. I will recheck mag

## 2013-05-03 NOTE — Assessment & Plan Note (Signed)
He has had high wBC for 1 year but stlowly improving. Recheck today

## 2013-05-05 ENCOUNTER — Other Ambulatory Visit: Payer: Self-pay | Admitting: Family Medicine

## 2013-05-05 MED ORDER — RANITIDINE HCL 150 MG PO CAPS: 150.0000 mg | ORAL_CAPSULE | Freq: Two times a day (BID) | ORAL | Status: DC

## 2013-05-05 NOTE — Telephone Encounter (Signed)
Med sent to pharmacy.

## 2013-05-06 ENCOUNTER — Ambulatory Visit (INDEPENDENT_AMBULATORY_CARE_PROVIDER_SITE_OTHER): Payer: Managed Care, Other (non HMO) | Admitting: Surgery

## 2013-05-06 ENCOUNTER — Ambulatory Visit
Admission: RE | Admit: 2013-05-06 | Discharge: 2013-05-06 | Disposition: A | Discharge status: Home / Self Care | Payer: Medicare Other | Source: Ambulatory Visit | Attending: Surgery | Admitting: Surgery

## 2013-05-06 ENCOUNTER — Other Ambulatory Visit: Payer: Self-pay

## 2013-05-06 ENCOUNTER — Other Ambulatory Visit: Payer: Self-pay | Admitting: *Deleted

## 2013-05-06 ENCOUNTER — Encounter: Payer: Self-pay | Admitting: Surgery

## 2013-05-06 VITALS — BP 112/67 | HR 83 | Resp 20 | Ht 71.0 in | Wt 274.0 lb

## 2013-05-06 DIAGNOSIS — C3490 Malignant neoplasm of unspecified part of unspecified bronchus or lung: Secondary | ICD-10-CM

## 2013-05-06 DIAGNOSIS — D381 Neoplasm of uncertain behavior of trachea, bronchus and lung: Secondary | ICD-10-CM

## 2013-05-06 DIAGNOSIS — Z09 Encounter for follow-up examination after completed treatment for conditions other than malignant neoplasm: Secondary | ICD-10-CM

## 2013-05-06 NOTE — Progress Notes (Signed)
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 He has been feeling well overall. He was recently treated for some bronchitis with a pulse of prednisone but is now back to his baseline. He denies any cough or sputum production. He denies any chest pain. He reports having some episodes of dizziness and blacking out with some seizure-like shaking and said that he has been referred to neurology for evaluation. He has continued to abstain from smoking but is using an E. Cigarette.   Current Outpatient Prescriptions  Medication Sig Dispense Refill  . albuterol (PROVENTIL) (2.5 MG/3ML) 0.083% nebulizer solution Take 3 mLs (2.5 mg total) by nebulization every 4 (four) hours as needed. For shortness of breath  75 mL  10  . aspirin EC 81 MG tablet Take 81 mg by mouth daily.      Marland Kitchen atorvastatin (LIPITOR) 20 MG tablet Take 20 mg by mouth daily.      . Calcium Carbonate-Vitamin D (CALCIUM 600/VITAMIN D PO) Take 1 tablet by mouth daily.      Marland Kitchen diltiazem (CARDIZEM CD) 120 MG 24 hr capsule Take 120 mg by mouth daily.      Marland Kitchen FLUoxetine (PROZAC) 40 MG capsule Take 1 capsule (40 mg total) by mouth daily.  90 capsule  3  . insulin aspart (NOVOLOG) 100 UNIT/ML injection Inject 10-25 Units into the skin 3 (three) times daily before meals. CBG < 70: implement hypoglycemia protocol CBG 70 - 120: 0 units CBG 121 - 150: 5 units CBG 151 - 200: 10 units CBG 201 - 250: 15 units CBG 251 - 300: 20 units CBG 301 - 350: 25 units CBG 351 - 400: 30 units  1 vial    . insulin glargine (LANTUS) 100 UNIT/ML injection Inject 60 Units into the skin every morning.      . Magnesium 250 MG TABS Take 500 mg by mouth 2 (two) times daily.       . Multiple Vitamin (MULTIVITAMIN WITH  MINERALS) TABS Take 1 tablet by mouth daily.      Marland Kitchen olopatadine (PATANOL) 0.1 % ophthalmic solution Place 1 drop into both eyes 2 (two) times daily.  5 mL  12  . ranitidine (ZANTAC) 150 MG capsule Take 1 capsule (150 mg total) by mouth 2 (two) times daily.  60 capsule  11  . thiamine (VITAMIN B-1) 100 MG tablet Take 100 mg by mouth daily.      Marland Kitchen tiotropium (SPIRIVA) 18 MCG inhalation capsule Place 1 capsule (18 mcg total) into inhaler and inhale daily.  90 capsule  3  . traZODone (DESYREL) 100 MG tablet TAKE ONE TABLET BY MOUTH EVERY DAY AT BEDTIME  90 tablet  0   No current facility-administered medications for this visit.     Physical Exam:  He looks well. There is no cervical or supraclavicular adenopathy. Lung exam is clear. The right thoracotomy scar looks good. There are no skin lesions. Cardiac exam shows a regular rate and rhythm with normal heart sounds.   Diagnostic Tests:   *RADIOLOGY REPORT*   Clinical Data: Lung nodule.  History of right lung cancer.   CT CHEST  LOW DOSE PILOT WITHOUT CONTRAST   Technique: Multidetector CT imaging of the chest using the standard low-dose protocol without administration of intravenous contrast.   Comparison: 10/07/2012.   Findings: Mediastinal lymph nodes measure up to 9 mm in the low right paratracheal station.  Hilar regions are difficult to definitively evaluate without IV contrast.  No axillary adenopathy. Mild bilateral gynecomastia.  Coronary artery calcification.  Heart size normal.  No pericardial effusion.   There are postoperative changes of right upper lobectomy.  A 3 mm nodule in the right middle lobe (series 302, image 33) is likely unchanged from baseline postoperative exam of 10/07/2012.  Interval clearing of previously seen peribronchovascular ground-glass.  No pleural fluid.  Airway is unremarkable.   Incidental imaging of the upper abdomen shows no acute findings. No worrisome lytic or sclerotic lesions.   Degenerative changes are seen in the spine.  Thoracotomy changes on the right.   IMPRESSION:   1.  Postoperative changes of right upper lobectomy, stable. 2.  Tiny right middle lobe nodule, unchanged from baseline postoperative exam of 10/07/2012. 3.  Interval clearing of previously seen diffuse patchy bilateral peribronchovascular ground-glass. 4.  Coronary artery calcification.     Original Report Authenticated By: Leanna Battles, M.D.    Impression:  He is doing well a little over a year following lung cancer resection. There is no evidence of recurrence on CT scan.  Plan:  I'll plan to see him back in 6 months with a chest x-ray.

## 2013-05-08 ENCOUNTER — Other Ambulatory Visit: Payer: Self-pay | Admitting: Family Medicine

## 2013-05-13 ENCOUNTER — Other Ambulatory Visit: Payer: Managed Care, Other (non HMO)

## 2013-05-13 DIAGNOSIS — D649 Anemia, unspecified: Secondary | ICD-10-CM

## 2013-05-14 LAB — FECAL OCCULT BLOOD, IMMUNOCHEMICAL: Fecal Occult Blood: NEGATIVE

## 2013-05-21 ENCOUNTER — Ambulatory Visit (INDEPENDENT_AMBULATORY_CARE_PROVIDER_SITE_OTHER): Payer: Managed Care, Other (non HMO) | Admitting: Family Medicine

## 2013-05-21 ENCOUNTER — Encounter: Payer: Self-pay | Admitting: Family Medicine

## 2013-05-21 VITALS — BP 100/48 | HR 100 | Temp 98.0°F | Resp 24 | Wt 273.0 lb

## 2013-05-21 DIAGNOSIS — D649 Anemia, unspecified: Secondary | ICD-10-CM

## 2013-05-21 LAB — VITAMIN B12: Vitamin B-12: 511 pg/mL (ref 211–911)

## 2013-05-21 LAB — CBC WITH DIFFERENTIAL/PLATELET
Basophils Absolute: 0 10*3/uL (ref 0.0–0.1)
HCT: 29.5 % — ABNORMAL LOW (ref 39.0–52.0)
Hemoglobin: 10 g/dL — ABNORMAL LOW (ref 13.0–17.0)
Lymphocytes Relative: 9 % — ABNORMAL LOW (ref 12–46)
Monocytes Absolute: 0.6 10*3/uL (ref 0.1–1.0)
Neutro Abs: 10.2 10*3/uL — ABNORMAL HIGH (ref 1.7–7.7)
RBC: 3.41 MIL/uL — ABNORMAL LOW (ref 4.22–5.81)
RDW: 14.4 % (ref 11.5–15.5)
WBC: 12 10*3/uL — ABNORMAL HIGH (ref 4.0–10.5)

## 2013-05-21 LAB — FOLATE: Folate: >20 ng/mL

## 2013-05-21 LAB — LACTATE DEHYDROGENASE: LDH: 124 U/L (ref 94–250)

## 2013-05-21 LAB — IRON: Iron: 92 ug/dL (ref 42–165)

## 2013-05-21 MED ORDER — ALPRAZOLAM 1 MG PO TABS: ORAL_TABLET | ORAL | Status: DC

## 2013-05-21 NOTE — Progress Notes (Signed)
Subjective:    Patient ID: [name], male    DOB: [birth year], 62 y.o.   MRN: [id number]  HPI Patient was found to have a hemoglobin of 9.8 and a creatinine of 1.8 on recent lab work his pulmonologist. His previous hemoglobin was greater than 11.  He denies abdominal pain, hematochezia, or melena. Performed three separate stool cards all of which were guaiac negative.  He is here today to recheck his hemoglobin. I also want to continue the workup for his anemia  He is also reporting insomnia. His daughter-in-law recently committed suicide, and he found her in the bathroom. He is having a difficult time going to sleep at night. Trazodone 100 mg by mouth each bedtime is not working.  Past Medical History  Diagnosis Date  . Anemia   . Allergy   . Thyroid disease   . Parathyroid disease   . Headache(784.0)   . Hypertension     takes  HYzaar daily  . Coronary artery disease     has 1 stent  . Myocardial infarct   . Asthma   . Emphysema     sees Dr.Ramaswami for this  . COPD (chronic obstructive pulmonary disease)     uses Albuterol and Spiriva daily  . Shortness of breath     with exertion   . Bronchitis   . Productive cough     white in color but no odor  . Sleep apnea     uses BiPaP  . Peripheral neuropathy   . Lung mass     right upper lobe  . Back pain     4 deteriorating disc and receives an injection q3-61mon;has been doing this for about 27yrs  . H/O hiatal hernia   . GERD (gastroesophageal reflux disease)     takes Prilosec daily  . Blood transfusion     as a child  . Glaucoma     hx of  . Depression     takes Prozac daily  . Insomnia     takes Trazodone nightly  . Anginal pain   . Chronic kidney disease     acute kidney failure post surgery  . Pneumonia     hx of' 62 yo,rd' last time in 2012  . Type II diabetes mellitus     takes Metformin bid and Novolog and Lantus daily  . Arthritis     "hands"   Current Outpatient Prescriptions on File Prior to  Visit  Medication Sig Dispense Refill  . albuterol (PROVENTIL) (2.5 MG/3ML) 0.083% nebulizer solution Take 3 mLs (2.5 mg total) by nebulization every 4 (four) hours as needed. For shortness of breath  75 mL  10  . aspirin EC 81 MG tablet Take 81 mg by mouth daily.      Marland Kitchen atorvastatin (LIPITOR) 20 MG tablet Take 20 mg by mouth daily.      . Calcium Carbonate-Vitamin D (CALCIUM 600/VITAMIN D PO) Take 1 tablet by mouth daily.      Marland Kitchen diltiazem (CARDIZEM CD) 120 MG 24 hr capsule Take 120 mg by mouth daily.      Marland Kitchen FLUoxetine (PROZAC) 40 MG capsule Take 1 capsule (40 mg total) by mouth daily.  90 capsule  3  . insulin aspart (NOVOLOG) 100 UNIT/ML injection Inject 10-25 Units into the skin 3 (three) times daily before meals. CBG < 70: implement hypoglycemia protocol CBG 70 - 120: 0 units CBG 121 - 150: 5 units CBG 151 - 200: 10 units CBG 201 -  250: 15 units CBG 251 - 300: 20 units CBG 301 - 350: 25 units CBG 351 - 400: 30 units  1 vial    . insulin glargine (LANTUS) 100 UNIT/ML injection Inject 60 Units into the skin every morning.      . Magnesium 250 MG TABS Take 500 mg by mouth 2 (two) times daily.       . Multiple Vitamin (MULTIVITAMIN WITH MINERALS) TABS Take 1 tablet by mouth daily.      Marland Kitchen olopatadine (PATANOL) 0.1 % ophthalmic solution Place 1 drop into both eyes 2 (two) times daily.  5 mL  12  . ranitidine (ZANTAC) 150 MG capsule Take 1 capsule (150 mg total) by mouth 2 (two) times daily.  60 capsule  11  . thiamine (VITAMIN B-1) 100 MG tablet Take 100 mg by mouth daily.      Marland Kitchen tiotropium (SPIRIVA) 18 MCG inhalation capsule Place 1 capsule (18 mcg total) into inhaler and inhale daily.  90 capsule  3  . traZODone (DESYREL) 100 MG tablet TAKE ONE TABLET BY MOUTH EVERY DAY AT BEDTIME  90 tablet  0   No current facility-administered medications on file prior to visit.   Allergies  Allergen Reactions  . Actos (Pioglitazone) Other (See Comments)    edema  . Avandia (Rosiglitazone) Other (See  Comments)    edema  . Dilaudid (Hydromorphone Hcl)     Made him crazy   History   Social History  . Marital Status: Married    Spouse Name: N/A    Number of Children: N/A  . Years of Education: N/A   Occupational History  . Not on file.   Social History Main Topics  . Smoking status: Current Every Day Smoker -- 1.00 packs/day for 45 years    Types: Cigarettes    Last Attempt to Quit: 03/26/2012  . Smokeless tobacco: Never Used  . Alcohol Use: Yes     Comment: gallon per week liquor "Haven't drink in 6 weeks"  . Drug Use: Yes    Special: Cocaine     Comment: quit 1990's  . Sexually Active: Not Currently   Other Topics Concern  . 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       Objective:   Physical Exam  Vitals reviewed. Cardiovascular: Normal rate, regular rhythm and normal heart sounds.   Pulmonary/Chest: Effort normal and breath sounds normal.  Abdominal: Soft. Bowel sounds are normal. He exhibits no distension. There is no tenderness. There is no rebound and no guarding.  Skin: Skin is warm and dry. No rash noted. No erythema. No pallor.  Psychiatric: He has a normal mood and affect. His behavior is normal. Judgment and thought content normal.   he has a right carotid bruit.        Assessment & Plan:  1. Anemia Recheck CBC. The patient has been on iron now for one month.  I will also check other labs to rule out other causes of anemia. If the anemia persists it could be due to his chronic kidney disease. He may benefit from erythropoietin. We may also consider an oncology referral if no better.  Given the fact that 3 separate stool cards were negative, I do not think the patient requires a GI workup at the present time. - CBC with  Differential - TSH - Vitamin B12 - Iron - Haptoglobin - Lactate dehydrogenase - Folate #2 insomnia Again the patient a one-time prescription for Xanax 1 mg tablets. He is to take one half to one tablet at night as needed for insomnia. Gave him 30 tablets. Hopefully over time we can go back to the trazodone.

## 2013-05-26 ENCOUNTER — Encounter: Payer: Self-pay | Admitting: Neurology

## 2013-05-26 ENCOUNTER — Ambulatory Visit (INDEPENDENT_AMBULATORY_CARE_PROVIDER_SITE_OTHER): Payer: Managed Care, Other (non HMO) | Admitting: Neurology

## 2013-05-26 VITALS — BP 123/68 | HR 81 | Ht 71.0 in | Wt 276.0 lb

## 2013-05-26 DIAGNOSIS — R42 Dizziness and giddiness: Secondary | ICD-10-CM

## 2013-05-26 DIAGNOSIS — R55 Syncope and collapse: Secondary | ICD-10-CM

## 2013-05-26 HISTORY — DX: Syncope and collapse: R55

## 2013-05-26 NOTE — Progress Notes (Signed)
Reason for visit: Syncope  [name] is a 62 y.o. male  History of present illness:  [name] is a 62 year old right-handed white male with a history of obesity, diabetes, tobacco abuse, and lung cancer. The patient was seen through this office in 2009 for episodes of dizziness. The patient indicates that these episodes have continued, now associated with episodes of syncope. The patient initially was having episodes of dizziness once every 2 or 3 months, and he now has episodes almost daily. The patient describes events of lightheaded sensations, and visual disturbance. The patient may have a bright area in his vision with a black hole in the center. The patient will then begin to have myoclonic jerks involving arms and/or legs. The patient will then lose consciousness for a few moments. The patient occasionally has an urge to urinate prior to the onset of the syncope. The patient is able to sit down or lie down, and he can prevent the episodes of loss of consciousness. The patient has a sensation of being "weak all over". The patient reports no focal numbness, but he has developed some problems with numbness in the ulnar distribution of the hands, right greater than left. The patient has a history of a peripheral neuropathy. The patient has undergone a prolonged cardiac monitoring evaluation for 30 days that did not show any cardiac rhythm abnormalities. The patient did have events of syncope during this monitoring period. The patient has not had a 2-D echocardiogram he claims in 5 or 6 years. He has been set up for a carotid Doppler study, but this has not yet been done. The patient is sent to this office for an evaluation.   Past Medical History  Diagnosis Date  . Anemia   . Allergy   . Thyroid disease   . Parathyroid disease   . Headache(784.0)   . Hypertension     takes  HYzaar daily  . Coronary artery disease     has 1 stent  . Myocardial infarct   . Asthma   . Emphysema      sees Dr.Ramaswami for this  . COPD (chronic obstructive pulmonary disease)     uses Albuterol and Spiriva daily  . Shortness of breath     with exertion   . Bronchitis   . Productive cough     white in color but no odor  . Sleep apnea     uses BiPaP  . Peripheral neuropathy   . Lung mass     right upper lobe  . Back pain     4 deteriorating disc and receives an injection q3-35mon;has been doing this for about 42yrs  . H/O hiatal hernia   . GERD (gastroesophageal reflux disease)     takes Prilosec daily  . Blood transfusion     as a child  . Glaucoma     hx of  . Depression     takes Prozac daily  . Insomnia     takes Trazodone nightly  . Anginal pain   . Chronic kidney disease     acute kidney failure post surgery  . Pneumonia     hx of' 62 yo,rd' last time in 2012  . Type II diabetes mellitus     takes Metformin bid and Novolog and Lantus daily  . Arthritis     "hands"  . Syncope and collapse 05/26/2013  . Obesity   . Periodic limb movements of sleep     Past Surgical History  Procedure Laterality Date  . Carpal tunnel release      bilateral  . Rotator cuff repair      left  . Colonoscopy    . Esophagogastroduodenoscopy    . Eye surgery    . Lobectomy  03/17/2012    upper right side with resection  . Elbow surgery      ulnar nerve; left  . Cardiac catheterization  2006/2008/2009  . Coronary angioplasty with stent placement      1 stent  . Refractive surgery      bilaterally  . Inguinal hernia repair  1990's    double inguinal    Family History  Problem Relation Age of Onset  . COPD Mother   . Diabetes Mother   . Hypertension Mother   . Heart attack Father   . Diabetes Father   . Anesthesia problems Neg Hx   . Hypotension Neg Hx   . Malignant hyperthermia Neg Hx   . Pseudochol deficiency Neg Hx     Social history:  reports that he has been smoking Cigarettes.  He has a 45 pack-year smoking history. He has never used smokeless tobacco. He  reports that  drinks alcohol. He reports that he uses illicit drugs (Cocaine).  Medications:  Current Outpatient Prescriptions on File Prior to Visit  Medication Sig Dispense Refill  . albuterol (PROVENTIL) (2.5 MG/3ML) 0.083% nebulizer solution Take 3 mLs (2.5 mg total) by nebulization every 4 (four) hours as needed. For shortness of breath  75 mL  10  . ALPRAZolam (XANAX) 1 MG tablet 1/2 to 1 pill at night for sleep as needed.  30 tablet  0  . aspirin EC 81 MG tablet Take 81 mg by mouth daily.      Marland Kitchen atorvastatin (LIPITOR) 20 MG tablet Take 20 mg by mouth daily.      . Calcium Carbonate-Vitamin D (CALCIUM 600/VITAMIN D PO) Take 1 tablet by mouth daily.      Marland Kitchen diltiazem (CARDIZEM CD) 120 MG 24 hr capsule Take 120 mg by mouth daily.      . ferrous sulfate 325 (65 FE) MG tablet Take by mouth daily with breakfast.      . FLUoxetine (PROZAC) 40 MG capsule Take 1 capsule (40 mg total) by mouth daily.  90 capsule  3  . insulin aspart (NOVOLOG) 100 UNIT/ML injection Inject 10-25 Units into the skin 3 (three) times daily before meals. CBG < 70: implement hypoglycemia protocol CBG 70 - 120: 0 units CBG 121 - 150: 5 units CBG 151 - 200: 10 units CBG 201 - 250: 15 units CBG 251 - 300: 20 units CBG 301 - 350: 25 units CBG 351 - 400: 30 units  1 vial    . insulin glargine (LANTUS) 100 UNIT/ML injection Inject 60 Units into the skin every morning.      . Magnesium 250 MG TABS Take 500 mg by mouth 2 (two) times daily.       . Multiple Vitamin (MULTIVITAMIN WITH MINERALS) TABS Take 1 tablet by mouth daily.      Marland Kitchen olopatadine (PATANOL) 0.1 % ophthalmic solution Place 1 drop into both eyes 2 (two) times daily.  5 mL  12  . ranitidine (ZANTAC) 150 MG capsule Take 1 capsule (150 mg total) by mouth 2 (two) times daily.  60 capsule  11  . thiamine (VITAMIN B-1) 100 MG tablet Take 100 mg by mouth daily.      Marland Kitchen tiotropium (SPIRIVA) 18 MCG inhalation  capsule Place 1 capsule (18 mcg total) into inhaler and inhale  daily.  90 capsule  3  . traZODone (DESYREL) 100 MG tablet TAKE ONE TABLET BY MOUTH EVERY DAY AT BEDTIME  90 tablet  0   No current facility-administered medications on file prior to visit.    Allergies:  Allergies  Allergen Reactions  . Actos (Pioglitazone) Other (See Comments)    edema  . Avandia (Rosiglitazone) Other (See Comments)    edema  . Dilaudid (Hydromorphone Hcl)     Made him crazy    ROS:  Out of a complete 14 system review of symptoms, the patient complains only of the following symptoms, and all other reviewed systems are negative.   weight gain, fatigue   swelling in the legs Ringing in the ears, dizziness Blurred vision Cough Urination problems, impotence Anemia, easy bruising, easy bleeding Feeling cold, increased thirst Achy muscles Skin sensitivity, frequent infections Memory loss, confusion, numbness, dizziness, syncope, tremor Depression, insomnia, disinterest in activities, suicidal thoughts, racing thoughts Restless legs  Blood pressure 123/68, pulse 81, height 5\' 11"  (1.803 m), weight 276 lb (125.193 kg).  Blood pressure standing, right arm, is 152/70. Blood pressure sitting, right arm is 152/80.  Physical Exam  General: The patient is alert and cooperative at the time of the examination. The patient is moderately to markedly obese   Head: Pupils are equal, round, and reactive to light. Discs are flat bilaterally.  Neck: The neck is supple, no carotid bruits are noted.  Respiratory: The respiratory examination is clear.  Cardiovascular: The cardiovascular examination reveals a regular rate and rhythm, no obvious murmurs or rubs are noted.  Skin: Extremities are without significant edema.  Neurologic Exam  Mental status:  Cranial nerves: Facial symmetry is present. There is good sensation of the face to pinprick and soft touch bilaterally. The strength of the facial muscles and the muscles to head turning and shoulder shrug are normal  bilaterally. Speech is well enunciated, no aphasia or dysarthria is noted. Extraocular movements are full. Visual fields are full.  Motor: The motor testing reveals 5 over 5 strength of all 4 extremities. Good symmetric motor tone is noted throughout.  Sensory: Sensory testing is intact to pinprick, soft touch, vibration sensation, and position sense on all 4 extremities, with the exception that there is a decrease in pinprick sensation on the right forearm as compared to left. No evidence of a stocking pattern pinprick sensory deficit in the legs is noted . No evidence of extinction is noted.  Coordination: Cerebellar testing reveals good finger-nose-finger and heel-to-shin bilaterally.  Gait and station: Gait is normal. Tandem gait is slightly unsteady. Romberg is negative. No drift is seen.  Reflexes: Deep tendon reflexes are symmetric bilaterally. The ankle jerk reflexes are depressed bilaterally.  Toes are downgoing bilaterally.   Assessment/Plan:  One. Episodic dizziness and syncope  2. Obesity  3. Diabetes  4. Tobacco abuse  The patient has had episodes of dizziness that dates back 5 or 6 years. The episodes become more frequent, now associated with brief episodes of syncope. The episodes are most consistent with an event that lowered the blood pressure, but cardiac rhythm monitoring has not shown any etiology for the events. The patient will be undergoing a carotid Doppler study, and the patient will have MRI evaluation the brain, MRA of the head, and an EEG study. The patient does not appear to have orthostatic hypotension on examination today. The patient followup in 4 months. The patient has  never had an event while sitting or lying down.   Marlan Palau MD 05/26/2013 7:49 PM  [facility] [address] [place], [place] [address]  Phone [phone] Fax [phone]

## 2013-05-27 ENCOUNTER — Ambulatory Visit (INDEPENDENT_AMBULATORY_CARE_PROVIDER_SITE_OTHER): Payer: Managed Care, Other (non HMO) | Admitting: Radiology

## 2013-05-27 ENCOUNTER — Telehealth: Payer: Self-pay | Admitting: Neurology

## 2013-05-27 DIAGNOSIS — R55 Syncope and collapse: Secondary | ICD-10-CM

## 2013-05-27 DIAGNOSIS — R42 Dizziness and giddiness: Secondary | ICD-10-CM

## 2013-05-27 NOTE — Procedures (Signed)
  History:  Rakesh Dutko is a 62 year old gentleman with a history of obesity, diabetes, tobacco abuse, and lung cancer. The patient has had frequent episodes of dizziness associated with visual disturbance, myoclonic jerks, and loss of consciousness. The patient is being evaluated for these events.  This is a routine EEG. No skull defects are noted. Medications include albuterol, Xanax, aspirin, Lipitor, diltiazem, iron supplementation, Prozac, insulin, magnesium, ranitidine, Symbicort, thiamine, Spiriva, and trazodone.  EEG classification: Normal awake and drowsy  Description of the recording: The background rhythms of this recording consists of a fairly well modulated medium amplitude alpha rhythm of 9 Hz that is reactive to eye opening and closure. As the record progresses, the patient appears to remain in the waking state throughout the recording. Photic stimulation was performed, resulting in a bilateral and symmetric photic driving response. Hyperventilation was not performed. Toward the end of the recording, the patient enters the drowsy state with slight symmetric slowing seen. The patient never enters stage II sleep. At no time during the recording does there appear to be evidence of spike or spike wave discharges or evidence of focal slowing. EKG monitor shows no evidence of cardiac rhythm abnormalities with a heart rate of 84.  Impression: This is a normal EEG recording in the waking and drowsy state. No evidence of ictal or interictal discharges are seen.

## 2013-05-27 NOTE — Telephone Encounter (Signed)
I called patient. The EEG study was unremarkable. 

## 2013-06-08 ENCOUNTER — Other Ambulatory Visit: Payer: Self-pay | Admitting: Family Medicine

## 2013-06-08 NOTE — Telephone Encounter (Signed)
Medication refilled per protocol. 

## 2013-06-11 DIAGNOSIS — R42 Dizziness and giddiness: Secondary | ICD-10-CM

## 2013-06-12 ENCOUNTER — Other Ambulatory Visit: Payer: Self-pay | Admitting: Neurology

## 2013-06-12 ENCOUNTER — Telehealth: Payer: Self-pay | Admitting: Neurology

## 2013-06-12 DIAGNOSIS — R42 Dizziness and giddiness: Secondary | ICD-10-CM

## 2013-06-12 DIAGNOSIS — R55 Syncope and collapse: Secondary | ICD-10-CM

## 2013-06-12 MED ORDER — TOPIRAMATE 25 MG PO TABS: ORAL_TABLET | ORAL | Status: DC

## 2013-06-12 NOTE — Telephone Encounter (Signed)
I called patient. 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 The patient has significant risk factors for this. EEG was unremarkable. The patient has had a carotid Doppler study, the results are unknown. This was done by Dr. Garnette Scheuermann. I'll give an empiric trial on Topamax to see if this helps. If this is not effective, we will discontinue the medication.

## 2013-06-15 ENCOUNTER — Ambulatory Visit (INDEPENDENT_AMBULATORY_CARE_PROVIDER_SITE_OTHER): Payer: Managed Care, Other (non HMO) | Admitting: Internal Medicine

## 2013-06-15 ENCOUNTER — Encounter: Payer: Self-pay | Admitting: Internal Medicine

## 2013-06-15 VITALS — BP 120/70 | HR 94 | Temp 98.4°F | Ht 71.0 in | Wt 277.0 lb

## 2013-06-15 DIAGNOSIS — F17200 Nicotine dependence, unspecified, uncomplicated: Secondary | ICD-10-CM

## 2013-06-15 DIAGNOSIS — J449 Chronic obstructive pulmonary disease, unspecified: Secondary | ICD-10-CM

## 2013-06-15 DIAGNOSIS — C3490 Malignant neoplasm of unspecified part of unspecified bronchus or lung: Secondary | ICD-10-CM

## 2013-06-15 NOTE — Progress Notes (Signed)
Subjective:    Patient ID: [name], male    DOB: [birth year], 62 y.o.   MRN: [id number]  HPI 1. tobacco abuse (wellbutrin intolerance hx - insomnia in early 2011, chantix intolerance )  - quit Nov 2012 after admission for pna - relapsed Jan/FEb 2013. Quit May 2013 after lobectomy for cancer - e-cigs as of Aug and Oct 2013 - back to regular cigs - June 2013  2A. 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 AECOPD - Nov 2012 admit for pneumonia  - Jan 2013 OPD Rx  - March 2013 - presumed opd Rx - February 2014-admission for COPD exacerbation  3. Mediastinal nodes Stable Oct 2009 - July 2011; no further fu  4.RUL pulmonary nodule with stage IA non-small cell lung cancer - May 2013  - 5mm July 2011 -> sept 2012,  unchanged  - February 2013: new 1.4 cm cavity and 9mm nodule RUL - -> - May 2013  - 1.9cm T1a, N0, M0  Stage 1A NSCLC with 0.3cm carcinoid - s/p lobectomy - December 2013 CT chest: No recurrence - July 2014 CT chest - no recurrence. RML nodule stabvle since Dec 2013   5. Obesity/osa on cpap and cough  Body mass index is 42.40 kg/(m^2). on 12/21/2011 Body mass index is 39.35 kg/(m^2). on 01/14/2013 Body mass index is 38.34 kg/(m^2). on 04/30/2013 Body mass index is 38.65 kg/(m^2). pn 06/15/2013        6. RLL pulmonary scarring  Stable CT 2008 -> 2011; no further fu  7. CAD - s/p stent  8. Hx of renal failure and persistent hypomagnesmia  - onset May 2013 following lobectomy -> renal failure resolved  - Oct 2013 - persistent hypomag being followed by Dr Lajuana Carry - April 2014: mag stil lpow -> PCP stopped protonix - June 2014: Normal mag   9. persistent unexplained leukocytosis   - 09/27/2012:  White count 20,000  - 12/12/2012: White count 22,000   - 12/26/2012: White count 19,500  - 4/23?14 - White cough: 11, 700   OV 06/15/2013  FU for above. Last visit June 2014  COPD = stable diseae. BREO made no difference and was expenseive. Feeling well with spiriva and symbicport; CAT score is 12  Lung cancer - had one year fu CT in July 2014 and no recurrence. Plans to see Dr Laneta Simmers in 6 months  Low Mag: resolved after dc protonic  Smoking: still heavily smoking. unable to quit      CAT COPD Symptom & Quality of Life Score (GSK trademark) 0 is no burden. 5 is highest burden 08/21/2012  01/14/2013  04/30/2013 aecopd x 3 weekk 06/15/2013 Spiriva, symbicort  Never Cough -> Cough all the time 2 3 4 2   No phlegm in chest -> Chest is full of phlegm 1 3 4 1   No chest tightness -> Chest feels very tight 1 3 4 1   No dyspnea for 1 flight stairs/hill -> Very dyspneic for 1 flight of stairs 2 4 5 1   No limitations for ADL at home -> Very limited with ADL at home 2 2 3 1   Confident leaving home -> Not at all confident leaving home 2 3 3  1  Sleep soundly -> Do not sleep soundly because of lung condition 3 5 4 1   Lots of Energy -> No energy at all 2 2 4 4   TOTAL Score (max 40)  15 22 31 12     Past, Family, Social reviewed: no change since last visit    Review of Systems  Constitutional: Negative for fever and unexpected weight change.  HENT: Negative for ear pain, nosebleeds, congestion, sore throat, rhinorrhea, sneezing, trouble swallowing, dental problem, postnasal drip and sinus pressure.   Eyes: Negative for redness and itching.  Respiratory: Negative for cough, chest tightness, shortness of breath and wheezing.   Cardiovascular: Negative for palpitations and leg swelling.  Gastrointestinal: Negative for nausea and vomiting.  Genitourinary: Negative for dysuria.  Musculoskeletal: Negative for joint swelling.  Skin: Negative for rash.  Neurological: Negative for headaches.   Hematological: Does not bruise/bleed easily.  Psychiatric/Behavioral: Negative for dysphoric mood. The patient is not nervous/anxious.    Current outpatient prescriptions:albuterol (PROVENTIL) (2.5 MG/3ML) 0.083% nebulizer solution, Take 3 mLs (2.5 mg total) by nebulization every 4 (four) hours as needed. For shortness of breath, Disp: 75 mL, Rfl: 10;  ALPRAZolam (XANAX) 1 MG tablet, 1/2 to 1 pill at night for sleep as needed., Disp: 30 tablet, Rfl: 0;  aspirin EC 81 MG tablet, Take 81 mg by mouth daily., Disp: , Rfl:  atorvastatin (LIPITOR) 20 MG tablet, Take 20 mg by mouth daily., Disp: , Rfl: ;  Calcium Carbonate-Vitamin D (CALCIUM 600/VITAMIN D PO), Take 1 tablet by mouth daily., Disp: , Rfl: ;  diltiazem (CARDIZEM CD) 120 MG 24 hr capsule, TAKE ONE CAPSULE BY MOUTH EVERY DAY, Disp: 90 capsule, Rfl: 1;  ferrous sulfate 325 (65 FE) MG tablet, Take by mouth daily with breakfast., Disp: , Rfl:  FLUoxetine (PROZAC) 40 MG capsule, Take 1 capsule (40 mg total) by mouth daily., Disp: 90 capsule, Rfl: 3;  insulin aspart (NOVOLOG) 100 UNIT/ML injection, Inject 10-25 Units into the skin 3 (three) times daily before meals. CBG < 70: implement hypoglycemia protocol CBG 70 - 120: 0 units CBG 121 - 150: 5 units CBG 151 - 200: 10 units CBG 201 - 250: 15 units CBG 251 - 300: 20 units CBG 301 - 350: 25 units CBG 351 - 400: 30 units, Disp: 1 vial, Rfl:  insulin glargine (LANTUS) 100 UNIT/ML injection, Inject 60 Units into the skin every morning., Disp: , Rfl: ;  Magnesium 250 MG TABS, Take 500 mg by mouth 2 (two) times daily. , Disp: , Rfl: ;  Multiple Vitamin (MULTIVITAMIN WITH MINERALS) TABS, Take 1 tablet by mouth daily., Disp: , Rfl: ;  NITROSTAT 0.4 MG SL tablet, Take 0.4 mg by mouth as needed., Disp: , Rfl:  olopatadine (PATANOL) 0.1 % ophthalmic solution, Place 1 drop into both eyes 2 (two) times daily., Disp: 5 mL, Rfl: 12;  ranitidine (ZANTAC) 150 MG capsule, Take 1 capsule (150 mg total) by mouth 2 (two) times  daily., Disp: 60 capsule, Rfl: 11;  SYMBICORT 160-4.5 MCG/ACT inhaler, Inhale 160 puffs into the lungs daily., Disp: , Rfl: ;  thiamine (VITAMIN B-1) 100 MG tablet, Take 100 mg by mouth daily., Disp: , Rfl:  tiotropium (SPIRIVA) 18 MCG inhalation capsule, Place 1 capsule (18 mcg total) into inhaler and inhale daily., Disp: 90 capsule, Rfl: 3;  topiramate (TOPAMAX) 25 MG tablet, 1 tablet at night for a week, then take 1 tablet twice daily, Disp: 60 tablet, Rfl: 3;  traZODone (DESYREL) 100 MG tablet, TAKE  ONE TABLET BY MOUTH EVERY DAY AT BEDTIME, Disp: 90 tablet, Rfl: 0      Objective:   Physical Exam  Filed Vitals:   06/15/13 1528  Height: 5\' 11"  (1.803 m)  Weight: 277 lb (125.646 kg)   Filed Vitals:   06/15/13 1528  BP: 120/70  Pulse: 94  Temp: 98.4 F (36.9 C)  TempSrc: Oral  Height: 5\' 11"  (1.803 m)  Weight: 277 lb (125.646 kg)  SpO2: 97%     GEN: A/Ox3; pleasant , NAD, obese   HEENT:  [place]/AT,  EACs-clear, TMs-wnl, NOSE-clear, THROAT-clear, no lesions, no postnasal drip or exudate noted.   NECK:  Supple w/ fair ROM; no JVD; normal carotid impulses w/o bruits; no thyromegaly or nodules palpated; no lymphadenopathy.  RESP  Few rhonchi , no dullness to percussion    CARD:  RRR, no m/r/g  , 1+peripheral edema, pulses intact, no cyanosis or clubbing.  GI:   Soft & nt; nml bowel sounds; no organomegaly or masses detected.  Musco: Warm bil, no deformities or joint swelling noted.   Neuro: alert, no focal deficits noted.    Skin: Warm, no lesions or rashes         Assessment & Plan:

## 2013-06-15 NOTE — Patient Instructions (Addendum)
#  COPD - Stable - Continue your oxygen and spiriva and symbicort as before  - Start N. acetylcysteine 600 mg twice a day   #Lung cancer  - CT chest July 2014 no recurrence - FU with Dr Laneta Simmers  #Smoking  - please quit;  I understand is very hard for you   #Followup - 6months  or  sooner as needed - CAT score at followupo -

## 2013-06-21 NOTE — Assessment & Plan Note (Signed)
Fu with Dr Laneta Simmers

## 2013-06-21 NOTE — Assessment & Plan Note (Signed)
#  COPD - Stable - Continue your oxygen and spiriva and symbicort as before  - Start N. acetylcysteine 600 mg twice a day   #Followup - 6months  or  sooner as needed - CAT score at followupo

## 2013-06-21 NOTE — Assessment & Plan Note (Signed)
advksed to quit

## 2013-08-06 ENCOUNTER — Other Ambulatory Visit: Payer: Self-pay | Admitting: Family Medicine

## 2013-08-07 NOTE — Telephone Encounter (Signed)
Ok x 5 rf

## 2013-08-07 NOTE — Telephone Encounter (Signed)
?   OK to Refill  

## 2013-08-11 ENCOUNTER — Other Ambulatory Visit: Payer: Self-pay

## 2013-08-11 MED ORDER — TOPIRAMATE 25 MG PO TABS: ORAL_TABLET | ORAL | Status: DC

## 2013-08-30 ENCOUNTER — Inpatient Hospital Stay (HOSPITAL_COMMUNITY)
Admission: EM | Admit: 2013-08-30 | Discharge: 2013-09-02 | DRG: 189 | Disposition: A | Discharge status: Home / Self Care | Payer: Managed Care, Other (non HMO) | Attending: Internal Medicine | Admitting: Internal Medicine

## 2013-08-30 ENCOUNTER — Emergency Department (HOSPITAL_COMMUNITY): Payer: Managed Care, Other (non HMO)

## 2013-08-30 ENCOUNTER — Encounter (HOSPITAL_COMMUNITY): Payer: Self-pay | Admitting: Emergency Medicine

## 2013-08-30 DIAGNOSIS — G4733 Obstructive sleep apnea (adult) (pediatric): Secondary | ICD-10-CM

## 2013-08-30 DIAGNOSIS — R569 Unspecified convulsions: Secondary | ICD-10-CM

## 2013-08-30 DIAGNOSIS — N182 Chronic kidney disease, stage 2 (mild): Secondary | ICD-10-CM | POA: Diagnosis present

## 2013-08-30 DIAGNOSIS — J9601 Acute respiratory failure with hypoxia: Secondary | ICD-10-CM

## 2013-08-30 DIAGNOSIS — R109 Unspecified abdominal pain: Secondary | ICD-10-CM

## 2013-08-30 DIAGNOSIS — J4489 Other specified chronic obstructive pulmonary disease: Secondary | ICD-10-CM

## 2013-08-30 DIAGNOSIS — N189 Chronic kidney disease, unspecified: Secondary | ICD-10-CM

## 2013-08-30 DIAGNOSIS — F32A Depression, unspecified: Secondary | ICD-10-CM

## 2013-08-30 DIAGNOSIS — G5793 Unspecified mononeuropathy of bilateral lower limbs: Secondary | ICD-10-CM

## 2013-08-30 DIAGNOSIS — J40 Bronchitis, not specified as acute or chronic: Secondary | ICD-10-CM

## 2013-08-30 DIAGNOSIS — I2510 Atherosclerotic heart disease of native coronary artery without angina pectoris: Secondary | ICD-10-CM | POA: Diagnosis present

## 2013-08-30 DIAGNOSIS — E876 Hypokalemia: Secondary | ICD-10-CM

## 2013-08-30 DIAGNOSIS — K529 Noninfective gastroenteritis and colitis, unspecified: Secondary | ICD-10-CM

## 2013-08-30 DIAGNOSIS — I252 Old myocardial infarction: Secondary | ICD-10-CM

## 2013-08-30 DIAGNOSIS — J449 Chronic obstructive pulmonary disease, unspecified: Secondary | ICD-10-CM

## 2013-08-30 DIAGNOSIS — F3289 Other specified depressive episodes: Secondary | ICD-10-CM | POA: Diagnosis present

## 2013-08-30 DIAGNOSIS — R1013 Epigastric pain: Secondary | ICD-10-CM

## 2013-08-30 DIAGNOSIS — D360 Benign neoplasm of lymph nodes: Secondary | ICD-10-CM

## 2013-08-30 DIAGNOSIS — J9600 Acute respiratory failure, unspecified whether with hypoxia or hypercapnia: Principal | ICD-10-CM | POA: Diagnosis present

## 2013-08-30 DIAGNOSIS — C3411 Malignant neoplasm of upper lobe, right bronchus or lung: Secondary | ICD-10-CM | POA: Diagnosis present

## 2013-08-30 DIAGNOSIS — J441 Chronic obstructive pulmonary disease with (acute) exacerbation: Secondary | ICD-10-CM

## 2013-08-30 DIAGNOSIS — C3490 Malignant neoplasm of unspecified part of unspecified bronchus or lung: Secondary | ICD-10-CM

## 2013-08-30 DIAGNOSIS — Z902 Acquired absence of lung [part of]: Secondary | ICD-10-CM

## 2013-08-30 DIAGNOSIS — R55 Syncope and collapse: Secondary | ICD-10-CM

## 2013-08-30 DIAGNOSIS — F329 Major depressive disorder, single episode, unspecified: Secondary | ICD-10-CM

## 2013-08-30 DIAGNOSIS — F17200 Nicotine dependence, unspecified, uncomplicated: Secondary | ICD-10-CM

## 2013-08-30 DIAGNOSIS — R739 Hyperglycemia, unspecified: Secondary | ICD-10-CM

## 2013-08-30 DIAGNOSIS — G4730 Sleep apnea, unspecified: Secondary | ICD-10-CM

## 2013-08-30 DIAGNOSIS — G4700 Insomnia, unspecified: Secondary | ICD-10-CM | POA: Diagnosis present

## 2013-08-30 DIAGNOSIS — I129 Hypertensive chronic kidney disease with stage 1 through stage 4 chronic kidney disease, or unspecified chronic kidney disease: Secondary | ICD-10-CM | POA: Diagnosis present

## 2013-08-30 DIAGNOSIS — G40909 Epilepsy, unspecified, not intractable, without status epilepticus: Secondary | ICD-10-CM | POA: Diagnosis present

## 2013-08-30 DIAGNOSIS — Z7982 Long term (current) use of aspirin: Secondary | ICD-10-CM

## 2013-08-30 DIAGNOSIS — E871 Hypo-osmolality and hyponatremia: Secondary | ICD-10-CM | POA: Diagnosis present

## 2013-08-30 DIAGNOSIS — R000 Tachycardia, unspecified: Secondary | ICD-10-CM

## 2013-08-30 DIAGNOSIS — K219 Gastro-esophageal reflux disease without esophagitis: Secondary | ICD-10-CM

## 2013-08-30 DIAGNOSIS — I10 Essential (primary) hypertension: Secondary | ICD-10-CM

## 2013-08-30 DIAGNOSIS — R42 Dizziness and giddiness: Secondary | ICD-10-CM

## 2013-08-30 DIAGNOSIS — Z794 Long term (current) use of insulin: Secondary | ICD-10-CM

## 2013-08-30 DIAGNOSIS — T7840XS Allergy, unspecified, sequela: Secondary | ICD-10-CM

## 2013-08-30 DIAGNOSIS — J069 Acute upper respiratory infection, unspecified: Secondary | ICD-10-CM

## 2013-08-30 DIAGNOSIS — E119 Type 2 diabetes mellitus without complications: Secondary | ICD-10-CM

## 2013-08-30 DIAGNOSIS — R0602 Shortness of breath: Secondary | ICD-10-CM

## 2013-08-30 DIAGNOSIS — Z9889 Other specified postprocedural states: Secondary | ICD-10-CM

## 2013-08-30 DIAGNOSIS — D72829 Elevated white blood cell count, unspecified: Secondary | ICD-10-CM

## 2013-08-30 DIAGNOSIS — I209 Angina pectoris, unspecified: Secondary | ICD-10-CM

## 2013-08-30 DIAGNOSIS — N179 Acute kidney failure, unspecified: Secondary | ICD-10-CM

## 2013-08-30 DIAGNOSIS — E662 Morbid (severe) obesity with alveolar hypoventilation: Secondary | ICD-10-CM | POA: Diagnosis present

## 2013-08-30 DIAGNOSIS — J189 Pneumonia, unspecified organism: Secondary | ICD-10-CM

## 2013-08-30 DIAGNOSIS — J479 Bronchiectasis, uncomplicated: Secondary | ICD-10-CM | POA: Diagnosis present

## 2013-08-30 DIAGNOSIS — Z01811 Encounter for preprocedural respiratory examination: Secondary | ICD-10-CM

## 2013-08-30 DIAGNOSIS — J180 Bronchopneumonia, unspecified organism: Secondary | ICD-10-CM

## 2013-08-30 DIAGNOSIS — E869 Volume depletion, unspecified: Secondary | ICD-10-CM

## 2013-08-30 DIAGNOSIS — R0789 Other chest pain: Secondary | ICD-10-CM

## 2013-08-30 DIAGNOSIS — D649 Anemia, unspecified: Secondary | ICD-10-CM

## 2013-08-30 DIAGNOSIS — R112 Nausea with vomiting, unspecified: Secondary | ICD-10-CM

## 2013-08-30 LAB — CBC
HCT: 27.5 % — ABNORMAL LOW (ref 39.0–52.0)
Hemoglobin: 9.7 g/dL — ABNORMAL LOW (ref 13.0–17.0)
MCH: 31 pg (ref 26.0–34.0)
MCHC: 35.3 g/dL (ref 30.0–36.0)
MCV: 87.9 fL (ref 78.0–100.0)
Platelets: 186 10*3/uL (ref 150–400)
RBC: 3.13 MIL/uL — ABNORMAL LOW (ref 4.22–5.81)
WBC: 14.9 10*3/uL — ABNORMAL HIGH (ref 4.0–10.5)

## 2013-08-30 LAB — BASIC METABOLIC PANEL
BUN: 36 mg/dL — ABNORMAL HIGH (ref 6–23)
CO2: 21 mEq/L (ref 19–32)
Calcium: 8.8 mg/dL (ref 8.4–10.5)
Chloride: 99 mEq/L (ref 96–112)
Creatinine, Ser: 2.44 mg/dL — ABNORMAL HIGH (ref 0.50–1.35)
GFR calc non Af Amer: 27 mL/min — ABNORMAL LOW (ref >90)
Glucose, Bld: 317 mg/dL — ABNORMAL HIGH (ref 70–99)
Sodium: 135 mEq/L (ref 135–145)

## 2013-08-30 LAB — GLUCOSE, CAPILLARY: Glucose-Capillary: >600 mg/dL (ref 70–99)

## 2013-08-30 LAB — POCT I-STAT TROPONIN I

## 2013-08-30 LAB — PRO B NATRIURETIC PEPTIDE: Pro B Natriuretic peptide (BNP): 236.3 pg/mL — ABNORMAL HIGH (ref 0–125)

## 2013-08-30 MED ORDER — DILTIAZEM HCL ER COATED BEADS 120 MG PO CP24
120.0000 mg | ORAL_CAPSULE | Freq: Every day | ORAL | Status: DC
  Administered 2013-08-31 – 2013-09-02 (×3): 120 mg via ORAL
  Filled 2013-08-30 (×3): qty 1

## 2013-08-30 MED ORDER — GABAPENTIN 400 MG PO CAPS
400.0000 mg | ORAL_CAPSULE | Freq: Every day | ORAL | Status: DC
  Administered 2013-08-30 – 2013-09-01 (×3): 400 mg via ORAL
  Filled 2013-08-30 (×4): qty 1

## 2013-08-30 MED ORDER — NICOTINE 21 MG/24HR TD PT24
21.0000 mg | MEDICATED_PATCH | Freq: Every day | TRANSDERMAL | Status: DC
  Administered 2013-08-30 – 2013-09-02 (×4): 21 mg via TRANSDERMAL
  Filled 2013-08-30 (×5): qty 1

## 2013-08-30 MED ORDER — INSULIN GLARGINE 100 UNIT/ML ~~LOC~~ SOLN
60.0000 [IU] | Freq: Every morning | SUBCUTANEOUS | Status: DC
  Administered 2013-08-31: 60 [IU] via SUBCUTANEOUS
  Filled 2013-08-30: qty 0.6

## 2013-08-30 MED ORDER — ASPIRIN EC 81 MG PO TBEC
81.0000 mg | DELAYED_RELEASE_TABLET | Freq: Every day | ORAL | Status: DC
  Administered 2013-08-31 – 2013-09-02 (×3): 81 mg via ORAL
  Filled 2013-08-30 (×3): qty 1

## 2013-08-30 MED ORDER — IPRATROPIUM BROMIDE 0.02 % IN SOLN
0.5000 mg | RESPIRATORY_TRACT | Status: DC
  Administered 2013-08-30 – 2013-08-31 (×5): 0.5 mg via RESPIRATORY_TRACT
  Filled 2013-08-30 (×6): qty 2.5

## 2013-08-30 MED ORDER — ACETAMINOPHEN 325 MG PO TABS
650.0000 mg | ORAL_TABLET | Freq: Four times a day (QID) | ORAL | Status: DC | PRN
  Administered 2013-08-30: 650 mg via ORAL
  Filled 2013-08-30: qty 2

## 2013-08-30 MED ORDER — INSULIN ASPART 100 UNIT/ML ~~LOC~~ SOLN
20.0000 [IU] | Freq: Once | SUBCUTANEOUS | Status: AC
  Administered 2013-08-30: 20 [IU] via SUBCUTANEOUS

## 2013-08-30 MED ORDER — PANTOPRAZOLE SODIUM 40 MG PO TBEC
40.0000 mg | DELAYED_RELEASE_TABLET | Freq: Every day | ORAL | Status: DC
  Administered 2013-08-31: 40 mg via ORAL
  Filled 2013-08-30: qty 1

## 2013-08-30 MED ORDER — INSULIN ASPART 100 UNIT/ML ~~LOC~~ SOLN: 0.0000 [IU] | Freq: Every day | SUBCUTANEOUS | Status: DC

## 2013-08-30 MED ORDER — ONDANSETRON HCL 4 MG PO TABS: 4.0000 mg | ORAL_TABLET | Freq: Four times a day (QID) | ORAL | Status: DC | PRN

## 2013-08-30 MED ORDER — ATORVASTATIN CALCIUM 20 MG PO TABS
20.0000 mg | ORAL_TABLET | Freq: Every day | ORAL | Status: DC
Start: 2013-08-30 — End: 2013-09-02
  Administered 2013-08-31 – 2013-09-02 (×3): 20 mg via ORAL
  Filled 2013-08-30 (×4): qty 1

## 2013-08-30 MED ORDER — DEXTROSE 5 % IV SOLN
1.0000 g | Freq: Once | INTRAVENOUS | Status: AC
  Administered 2013-08-30: 1 g via INTRAVENOUS
  Filled 2013-08-30: qty 10

## 2013-08-30 MED ORDER — ALBUTEROL SULFATE (5 MG/ML) 0.5% IN NEBU: 5.0000 mg | INHALATION_SOLUTION | RESPIRATORY_TRACT | Status: DC | PRN

## 2013-08-30 MED ORDER — IPRATROPIUM BROMIDE 0.02 % IN SOLN
0.5000 mg | Freq: Once | RESPIRATORY_TRACT | Status: AC
  Administered 2013-08-30: 0.5 mg via RESPIRATORY_TRACT
  Filled 2013-08-30: qty 2.5

## 2013-08-30 MED ORDER — FLUOXETINE HCL 20 MG PO CAPS
40.0000 mg | ORAL_CAPSULE | Freq: Every day | ORAL | Status: DC
  Administered 2013-08-31 – 2013-09-02 (×3): 40 mg via ORAL
  Filled 2013-08-30 (×4): qty 2

## 2013-08-30 MED ORDER — ONDANSETRON HCL 4 MG/2ML IJ SOLN: 4.0000 mg | Freq: Four times a day (QID) | INTRAMUSCULAR | Status: DC | PRN

## 2013-08-30 MED ORDER — OLOPATADINE HCL 0.1 % OP SOLN
1.0000 [drp] | Freq: Two times a day (BID) | OPHTHALMIC | Status: DC
  Administered 2013-08-30 – 2013-09-02 (×6): 1 [drp] via OPHTHALMIC
  Filled 2013-08-30: qty 5

## 2013-08-30 MED ORDER — SODIUM CHLORIDE 0.9 % IV BOLUS (SEPSIS)
700.0000 mL | Freq: Once | INTRAVENOUS | Status: AC
  Administered 2013-08-30: 700 mL via INTRAVENOUS

## 2013-08-30 MED ORDER — DEXTROSE 5 % IV SOLN
500.0000 mg | Freq: Once | INTRAVENOUS | Status: AC
  Administered 2013-08-30: 500 mg via INTRAVENOUS

## 2013-08-30 MED ORDER — INSULIN ASPART 100 UNIT/ML ~~LOC~~ SOLN: 0.0000 [IU] | Freq: Three times a day (TID) | SUBCUTANEOUS | Status: DC

## 2013-08-30 MED ORDER — DEXTROSE 5 % IV SOLN
500.0000 mg | INTRAVENOUS | Status: DC
  Administered 2013-08-31 – 2013-09-01 (×2): 500 mg via INTRAVENOUS
  Filled 2013-08-30 (×3): qty 500

## 2013-08-30 MED ORDER — METHYLPREDNISOLONE SODIUM SUCC 125 MG IJ SOLR
125.0000 mg | Freq: Once | INTRAMUSCULAR | Status: AC
  Administered 2013-08-30: 125 mg via INTRAVENOUS
  Filled 2013-08-30: qty 2

## 2013-08-30 MED ORDER — SODIUM CHLORIDE 0.9 % IV SOLN
INTRAVENOUS | Status: DC
  Administered 2013-08-31: 02:00:00 via INTRAVENOUS
  Administered 2013-08-31: 13:00:00 50 mL/h via INTRAVENOUS
  Administered 2013-09-01 – 2013-09-02 (×3): via INTRAVENOUS

## 2013-08-30 MED ORDER — TRAZODONE HCL 100 MG PO TABS
100.0000 mg | ORAL_TABLET | Freq: Every day | ORAL | Status: DC
  Administered 2013-08-30 – 2013-09-01 (×3): 100 mg via ORAL
  Filled 2013-08-30 (×4): qty 1

## 2013-08-30 MED ORDER — NITROGLYCERIN 0.4 MG SL SUBL: 0.4000 mg | SUBLINGUAL_TABLET | SUBLINGUAL | Status: DC | PRN

## 2013-08-30 MED ORDER — ALBUTEROL SULFATE (5 MG/ML) 0.5% IN NEBU
5.0000 mg | INHALATION_SOLUTION | Freq: Once | RESPIRATORY_TRACT | Status: AC
  Administered 2013-08-30: 5 mg via RESPIRATORY_TRACT
  Filled 2013-08-30: qty 1

## 2013-08-30 MED ORDER — SODIUM CHLORIDE 0.9 % IJ SOLN
3.0000 mL | Freq: Two times a day (BID) | INTRAMUSCULAR | Status: DC
  Administered 2013-09-01 – 2013-09-02 (×3): 3 mL via INTRAVENOUS

## 2013-08-30 MED ORDER — ACETAMINOPHEN 650 MG RE SUPP: 650.0000 mg | Freq: Four times a day (QID) | RECTAL | Status: DC | PRN

## 2013-08-30 MED ORDER — METHYLPREDNISOLONE SODIUM SUCC 125 MG IJ SOLR
60.0000 mg | Freq: Three times a day (TID) | INTRAMUSCULAR | Status: DC
  Administered 2013-08-30: 18:00:00 via INTRAVENOUS
  Administered 2013-08-31 – 2013-09-01 (×6): 60 mg via INTRAVENOUS
  Filled 2013-08-30 (×10): qty 0.96

## 2013-08-30 MED ORDER — DEXTROSE 5 % IV SOLN
1.0000 g | INTRAVENOUS | Status: DC
  Administered 2013-08-31 – 2013-09-01 (×2): 1 g via INTRAVENOUS
  Filled 2013-08-30 (×3): qty 10

## 2013-08-30 MED ORDER — ALBUTEROL (5 MG/ML) CONTINUOUS INHALATION SOLN
15.0000 mg/h | INHALATION_SOLUTION | Freq: Once | RESPIRATORY_TRACT | Status: AC
  Administered 2013-08-30: 15 mg/h via RESPIRATORY_TRACT
  Filled 2013-08-30: qty 20

## 2013-08-30 MED ORDER — ALUM & MAG HYDROXIDE-SIMETH 200-200-20 MG/5ML PO SUSP: 30.0000 mL | Freq: Four times a day (QID) | ORAL | Status: DC | PRN

## 2013-08-30 MED ORDER — SODIUM CHLORIDE 0.9 % IV SOLN
INTRAVENOUS | Status: DC
  Administered 2013-08-30: 12:00:00 via INTRAVENOUS

## 2013-08-30 MED ORDER — OXYCODONE HCL 5 MG PO TABS
5.0000 mg | ORAL_TABLET | ORAL | Status: DC | PRN
  Administered 2013-08-31 (×2): 5 mg via ORAL
  Filled 2013-08-30 (×2): qty 1

## 2013-08-30 MED ORDER — SORBITOL 70 % SOLN
30.0000 mL | Freq: Every day | Status: DC | PRN
  Filled 2013-08-30: qty 30

## 2013-08-30 MED ORDER — TOPIRAMATE 25 MG PO TABS
25.0000 mg | ORAL_TABLET | Freq: Two times a day (BID) | ORAL | Status: DC
  Administered 2013-08-30 – 2013-09-02 (×6): 25 mg via ORAL
  Filled 2013-08-30 (×7): qty 1

## 2013-08-30 MED ORDER — ENOXAPARIN SODIUM 40 MG/0.4ML ~~LOC~~ SOLN
40.0000 mg | SUBCUTANEOUS | Status: DC
  Administered 2013-08-30 – 2013-09-01 (×3): 40 mg via SUBCUTANEOUS
  Filled 2013-08-30 (×4): qty 0.4

## 2013-08-30 MED ORDER — ALBUTEROL SULFATE (5 MG/ML) 0.5% IN NEBU
2.5000 mg | INHALATION_SOLUTION | RESPIRATORY_TRACT | Status: DC
  Administered 2013-08-30 – 2013-08-31 (×4): 2.5 mg via RESPIRATORY_TRACT
  Filled 2013-08-30 (×4): qty 0.5

## 2013-08-30 NOTE — H&P (Signed)
[facility] History and Physical  CHRISTROPHER GINTZ ION:[id number] DOB: 1951/09/25 DOA: 08/30/2013  Referring physician PCP: Leo Grosser, MD  Specialists:   Chief Complaint: Cough/shortness of breath  HPI: [name] is a 62 y.o. male with multiple comorbidities including type 2 diabetes mellitus, obstructive sleep apnea, coronary artery disease who presents emergency department today with complaints of cough, wheezing, shortness of breath. He states having productive cough with green sputum production, not exertional dyspnea, subjective fevers chills, malaise, fatigue, as well as wheezing, which have become progressively worse over the past week. He also complains of subjective fevers and chills as well as chest pain precipitated by cough and deep inspiration. Patient has not been on recent antibiotic therapy. He does not utilize home oxygen although wears BiPAP at nighttime for obstructive sleep apnea. States using nebulizers at home which helped for a short period of time. Unfortunately he continues to smoke about a pack of cigarettes a day. In the emergency room he was administered Solu-Medrol 125 mg IV x1 along with ceftriaxone and azithromycin.                                                   Review of Systems: The patient denies anorexia, weight loss,, vision loss, decreased hearing, hoarseness, syncope, dyspnea on exertion, peripheral edema, balance deficits, hemoptysis, abdominal pain, melena, hematochezia, severe indigestion/heartburn, hematuria, incontinence, genital sores, muscle weakness, suspicious skin lesions, transient blindness, difficulty walking, depression, unusual weight change, abnormal bleeding, enlarged lymph nodes, angioedema, and breast masses.    Past Medical History  Diagnosis Date  . Anemia   . Allergy   . Thyroid disease   . Parathyroid disease   . Headache(784.0)   . Hypertension     takes  HYzaar daily  . Coronary artery disease     has 1  stent  . Myocardial infarct   . Asthma   . Emphysema     sees Dr.Ramaswami for this  . COPD (chronic obstructive pulmonary disease)     uses Albuterol and Spiriva daily  . Shortness of breath     with exertion   . Bronchitis   . Productive cough     white in color but no odor  . Sleep apnea     uses BiPaP  . Peripheral neuropathy   . Lung mass     right upper lobe  . Back pain     4 deteriorating disc and receives an injection q3-85mon;has been doing this for about 61yrs  . H/O hiatal hernia   . GERD (gastroesophageal reflux disease)     takes Prilosec daily  . Blood transfusion     as a child  . Glaucoma     hx of  . Depression     takes Prozac daily  . Insomnia     takes Trazodone nightly  . Anginal pain   . Chronic kidney disease     acute kidney failure post surgery  . Pneumonia     hx of' 62 yo,rd' last time in 2012  . Type II diabetes mellitus     takes Metformin bid and Novolog and Lantus daily  . Arthritis     "hands"  . Syncope and collapse 05/26/2013  . Obesity   . Periodic limb movements of sleep    Past Surgical History  Procedure Laterality  Date  . Carpal tunnel release      bilateral  . Rotator cuff repair      left  . Colonoscopy    . Esophagogastroduodenoscopy    . Eye surgery    . Lobectomy  03/17/2012    upper right side with resection  . Elbow surgery      ulnar nerve; left  . Cardiac catheterization  2006/2008/2009  . Coronary angioplasty with stent placement      1 stent  . Refractive surgery      bilaterally  . Inguinal hernia repair  1990's    double inguinal   Social History:  reports that he has been smoking Cigarettes.  He has a 45 pack-year smoking history. He has never used smokeless tobacco. He reports that he drinks alcohol. He reports that he uses illicit drugs (Cocaine).   Allergies  Allergen Reactions  . Actos [Pioglitazone] Other (See Comments)    edema  . Avandia [Rosiglitazone] Other (See Comments)    edema  .  Dilaudid [Hydromorphone Hcl]     Made him crazy    Family History  Problem Relation Age of Onset  . COPD Mother   . Diabetes Mother   . Hypertension Mother   . Heart attack Father   . Diabetes Father   . Anesthesia problems Neg Hx   . Hypotension Neg Hx   . Malignant hyperthermia Neg Hx   . Pseudochol deficiency Neg Hx     Prior to Admission medications   Medication Sig Start Date End Date Taking? Authorizing Provider  albuterol (PROVENTIL) (2.5 MG/3ML) 0.083% nebulizer solution Take 3 mLs (2.5 mg total) by nebulization every 4 (four) hours as needed. For shortness of breath 09/24/12   Richarda Overlie, MD  ALPRAZolam Prudy Feeler) 1 MG tablet 1/2 to 1 pill at night for sleep as needed. 05/21/13   Donita Brooks, MD  aspirin EC 81 MG tablet Take 81 mg by mouth daily.    Historical Provider, MD  atorvastatin (LIPITOR) 20 MG tablet Take 20 mg by mouth daily.    Historical Provider, MD  Calcium Carbonate-Vitamin D (CALCIUM 600/VITAMIN D PO) Take 1 tablet by mouth daily.    Historical Provider, MD  diltiazem (CARDIZEM CD) 120 MG 24 hr capsule TAKE ONE CAPSULE BY MOUTH EVERY DAY 06/08/13   Donita Brooks, MD  ferrous sulfate 325 (65 FE) MG tablet Take by mouth daily with breakfast.    Historical Provider, MD  FLUoxetine (PROZAC) 40 MG capsule Take 1 capsule (40 mg total) by mouth daily. 01/23/13   Donita Brooks, MD  insulin aspart (NOVOLOG) 100 UNIT/ML injection Inject 10-25 Units into the skin 3 (three) times daily before meals. CBG < 70: implement hypoglycemia protocol CBG 70 - 120: 0 units CBG 121 - 150: 5 units CBG 151 - 200: 10 units CBG 201 - 250: 15 units CBG 251 - 300: 20 units CBG 301 - 350: 25 units CBG 351 - 400: 30 units 09/29/12   Renae Fickle, MD  insulin glargine (LANTUS) 100 UNIT/ML injection Inject 60 Units into the skin every morning. 12/16/12   Rhetta Mura, MD  Magnesium 250 MG TABS Take 500 mg by mouth 2 (two) times daily.     Historical Provider, MD  Multiple  Vitamin (MULTIVITAMIN WITH MINERALS) TABS Take 1 tablet by mouth daily.    Historical Provider, MD  NITROSTAT 0.4 MG SL tablet Take 0.4 mg by mouth as needed. 05/19/13   Historical Provider, MD  olopatadine (PATANOL) 0.1 % ophthalmic solution Place 1 drop into both eyes 2 (two) times daily. 02/23/13   Donita Brooks, MD  ranitidine (ZANTAC) 150 MG capsule Take 1 capsule (150 mg total) by mouth 2 (two) times daily. 05/05/13   Donita Brooks, MD  SYMBICORT 160-4.5 MCG/ACT inhaler Inhale 160 puffs into the lungs daily. 05/17/13   Historical Provider, MD  thiamine (VITAMIN B-1) 100 MG tablet Take 100 mg by mouth daily.    Historical Provider, MD  tiotropium (SPIRIVA) 18 MCG inhalation capsule Place 1 capsule (18 mcg total) into inhaler and inhale daily. 04/30/13   Kalman Shan, MD  topiramate (TOPAMAX) 25 MG tablet 1 tablet at night for a week, then take 1 tablet twice daily 08/11/13   York Spaniel, MD  traZODone (DESYREL) 100 MG tablet TAKE ONE TABLET BY MOUTH EVERY NIGHT AT BEDTIME 08/06/13   Donita Brooks, MD   Physical Exam: Filed Vitals:   08/30/13 1515  BP: 111/39  Pulse: 105  Temp:   Resp:      General:  Patient appears ill, though in no acute distress, appears to be breathing comfortably in no respiratory distress  Eyes: Pupils are equal round and reactive to light extraocular movement is intact  Neck: Neck is supple symmetrical no jugular venous distention  Cardiovascular: Regular rate and rhythm normal S1-S2 no murmurs rubs or gallops  Respiratory: He has diminished breath sounds bilaterally with bilateral expiratory wheezes present. On supplemental oxygen does not appear to be in acute respiratory distress.  Abdomen: Obese soft nontender nondistended positive bowel sounds  Skin: No rashes or lesions noted  Musculoskeletal: Present range of motion of all extremities, no edema present  Psychiatric: Patient is awake alert oriented x3  Neurologic: Cranial nerves 2-12  grossly intact no alteration to sensation global 5 out of 5 muscle strength  Labs on Admission:  Basic Metabolic Panel:  Recent Labs Lab 08/30/13 1129  NA 135  K 4.6  CL 99  CO2 21  GLUCOSE 317*  BUN 36*  CREATININE 2.44*  CALCIUM 8.8   Liver Function Tests: No results found for this basename: AST, ALT, ALKPHOS, BILITOT, PROT, ALBUMIN,  in the last 168 hours No results found for this basename: LIPASE, AMYLASE,  in the last 168 hours No results found for this basename: AMMONIA,  in the last 168 hours CBC:  Recent Labs Lab 08/30/13 1129  WBC 14.9*  HGB 9.7*  HCT 27.5*  MCV 87.9  PLT 186   Cardiac Enzymes: No results found for this basename: CKTOTAL, CKMB, CKMBINDEX, TROPONINI,  in the last 168 hours  BNP (last 3 results)  Recent Labs  12/12/12 1543 12/26/12 1133 08/30/13 1129  PROBNP 787.2* 209.0* 236.3*   CBG: No results found for this basename: GLUCAP,  in the last 168 hours  Radiological Exams on Admission: Dg Chest 2 View (if Patient Has Fever And/or Copd)  08/30/2013   CLINICAL DATA:  Shortness of breath, cough, COPD  EXAM: CHEST  2 VIEW  COMPARISON:  01/14/2013  FINDINGS: Mild cardiac enlargement stable. No pleural effusion or consolidation. There are increased bilateral perihilar markings. This is also similar when compared to the prior study.  IMPRESSION: Chronic interstitial change. No acute findings.   Electronically Signed   By: Esperanza Heir M.D.   On: 08/30/2013 13:05      Assessment/Plan Active Problems:   Non-small cell carcinoma of lung, stage 1   Hypertension   GERD (gastroesophageal reflux disease)  Pneumonia   Type II diabetes mellitus   COPD with exacerbation   Acute on chronic renal failure   OSA (obstructive sleep apnea)   1. Chronic obstructive pulmonary disease exacerbation. I suspect may have been precipitated by underlying infectious process. Will continue IV steroids with Solu-Medrol 60 mg IV q. 8 hours, DuoNeb to be  scheduled every 4 hours, empiric IV antibiotic therapy with Rocephin 1 g IV every 24 hours and azithromycin 500 mg IV every 24 hours. 2. Community acquire pneumonia. A shunt presenting with clinical signs and symptoms suggestive of community-acquired pneumonia, although initial chest x-ray did not show infiltrate. Will obtain a set of blood cultures, start empiric IV antibiotic therapy with ceftriaxone and azithromycin. 3. Acute on chronic renal failure. Patient with a history of stage II chronic kidney disease, appearing to have a baseline creatinine between 1.4 and 1.6. Lab work showing a creatinine of 2.44. I suspect may be related to prerenal azotemia. Will provide IV fluids, followup with a.m. BMP. 4. Gastroesophageal reflux disease. Proton pump inhibitor therapy 5. Coronary artery disease. Will place patient on continuous cardiac monitoring. Patient's complaints of chest pain appeared atypical and likely pleuritic in nature as he reports deep inspiration and cough precipitating chest pain. Continue antiplatelet therapy and statin 6. Insulin-dependent diabetes mellitus. We'll continue Lantus 60 units subcutaneous daily with a sliding scale coverage, Accu-Cheks Q. a.c. each bedtime. Will monitor closely for hyperglycemia as patient will be started on steroids. 7. Hypertension. Continue diltiazem 120 mg by mouth daily 8. History of seizure disorder. Will continue Topamax 25 mg by mouth daily 9. DVT prophylaxis. Lovenox 40 mg subcutaneous daily   Code Status: Full code Family Communication: Plan discussed with patient's wife present at bedside Disposition Plan: We'll admit patient to telemetry, I anticipate he'll require greater than 2 night hospitalization  Time spent: 70 minutes  Jeralyn Bennett [facility] Pager [phone]  If 7PM-7AM, please contact night-coverage [email or web address] Password [facility] 08/30/2013, 4:39 PM

## 2013-08-30 NOTE — ED Notes (Signed)
Patient returned from X-ray 

## 2013-08-30 NOTE — Progress Notes (Signed)
Patient set up on FFM BiPAP for QHS per patient's home regimen.  2L 02 bleed in.  Patient is unsure of exact settings, 12/6 used.  Patient is familiar with equipment and procedure.

## 2013-08-30 NOTE — ED Provider Notes (Addendum)
CSN: [id number]     Arrival date & time 08/30/13  1010 History   First MD Initiated Contact with Patient 08/30/13 1106     Chief Complaint  Patient presents with  . Shortness of Breath  . COPD   (Consider location/radiation/quality/duration/timing/severity/associated sxs/prior Treatment) HPI Patient has a history of COPD. He reports for the past week he's been having worsening shortness of breath but getting progressively worse. He states he is  short of breath and has been having wheezing. He has a cough with green sputum production but he denies fever. He states he had chills  last night all night long. He has clear rhinorrhea and states his throat hurts when he coughs. He also states he has some left anterior chest pain that is constant and dull and gets worse when he coughs. He denies vomiting or diarrhea. Patient states that when he uses his nebulizer is not helping for very long. Patient reports normally when he has his flareups he has to be admitted to the hospital. He states that he normally gets steroids and he has to monitor  his glucose closely. Pt also states he has had a 10 pound weight gain in the past week.   PCP Dr Tanya Nones at [facility] Dr Bonney Aid  Past Medical History  Diagnosis Date  . Anemia   . Allergy   . Thyroid disease   . Parathyroid disease   . Headache(784.0)   . Hypertension     takes  HYzaar daily  . Coronary artery disease     has 1 stent  . Myocardial infarct   . Asthma   . Emphysema     sees Dr.Ramaswami for this  . COPD (chronic obstructive pulmonary disease)     uses Albuterol and Spiriva daily  . Shortness of breath     with exertion   . Bronchitis   . Productive cough     white in color but no odor  . Sleep apnea     uses BiPaP  . Peripheral neuropathy   . Lung mass     right upper lobe  . Back pain     4 deteriorating disc and receives an injection q3-68mon;has been doing this for about 39yrs  . H/O hiatal hernia   . GERD  (gastroesophageal reflux disease)     takes Prilosec daily  . Blood transfusion     as a child  . Glaucoma     hx of  . Depression     takes Prozac daily  . Insomnia     takes Trazodone nightly  . Anginal pain   . Chronic kidney disease     acute kidney failure post surgery  . Pneumonia     hx of' 62 yo,rd' last time in 2012  . Type II diabetes mellitus     takes Metformin bid and Novolog and Lantus daily  . Arthritis     "hands"  . Syncope and collapse 05/26/2013  . Obesity   . Periodic limb movements of sleep    Past Surgical History  Procedure Laterality Date  . Carpal tunnel release      bilateral  . Rotator cuff repair      left  . Colonoscopy    . Esophagogastroduodenoscopy    . Eye surgery    . Lobectomy  03/17/2012    upper right side with resection  . Elbow surgery      ulnar nerve; left  . Cardiac catheterization  2006/2008/2009  .  Coronary angioplasty with stent placement      1 stent  . Refractive surgery      bilaterally  . Inguinal hernia repair  1990's    double inguinal   Family History  Problem Relation Age of Onset  . COPD Mother   . Diabetes Mother   . Hypertension Mother   . Heart attack Father   . Diabetes Father   . Anesthesia problems Neg Hx   . Hypotension Neg Hx   . Malignant hyperthermia Neg Hx   . Pseudochol deficiency Neg Hx    History  Substance Use Topics  . Smoking status: Current Every Day Smoker -- 1.00 packs/day for 45 years    Types: Cigarettes    Last Attempt to Quit: 03/26/2012  . Smokeless tobacco: Never Used  . Alcohol Use: Yes     Comment: gallon per week liquor "Haven't drink in 6 weeks"  pt smokes 1 ppd Uses oxygen at night 2 lpm [place] Bipap at night Disability for diabetes Quit drinking 90 days ago  Review of Systems  All other systems reviewed and are negative.    Allergies  Actos; Avandia; and Dilaudid  Home Medications   Current Outpatient Rx  Name  Route  Sig  Dispense  Refill  . albuterol  (PROVENTIL) (2.5 MG/3ML) 0.083% nebulizer solution   Nebulization   Take 3 mLs (2.5 mg total) by nebulization every 4 (four) hours as needed. For shortness of breath   75 mL   10   . ALPRAZolam (XANAX) 1 MG tablet      1/2 to 1 pill at night for sleep as needed.   30 tablet   0   . aspirin EC 81 MG tablet   Oral   Take 81 mg by mouth daily.         Marland Kitchen atorvastatin (LIPITOR) 20 MG tablet   Oral   Take 20 mg by mouth daily.         . Calcium Carbonate-Vitamin D (CALCIUM 600/VITAMIN D PO)   Oral   Take 1 tablet by mouth daily.         Marland Kitchen diltiazem (CARDIZEM CD) 120 MG 24 hr capsule      TAKE ONE CAPSULE BY MOUTH EVERY DAY   90 capsule   1   . ferrous sulfate 325 (65 FE) MG tablet   Oral   Take by mouth daily with breakfast.         . FLUoxetine (PROZAC) 40 MG capsule   Oral   Take 1 capsule (40 mg total) by mouth daily.   90 capsule   3   . insulin aspart (NOVOLOG) 100 UNIT/ML injection   Subcutaneous   Inject 10-25 Units into the skin 3 (three) times daily before meals. CBG < 70: implement hypoglycemia protocol CBG 70 - 120: 0 units CBG 121 - 150: 5 units CBG 151 - 200: 10 units CBG 201 - 250: 15 units CBG 251 - 300: 20 units CBG 301 - 350: 25 units CBG 351 - 400: 30 units   1 vial      . insulin glargine (LANTUS) 100 UNIT/ML injection   Subcutaneous   Inject 60 Units into the skin every morning.         . Magnesium 250 MG TABS   Oral   Take 500 mg by mouth 2 (two) times daily.          . Multiple Vitamin (MULTIVITAMIN WITH MINERALS) TABS   Oral  Take 1 tablet by mouth daily.         Marland Kitchen NITROSTAT 0.4 MG SL tablet   Oral   Take 0.4 mg by mouth as needed.         Marland Kitchen olopatadine (PATANOL) 0.1 % ophthalmic solution   Both Eyes   Place 1 drop into both eyes 2 (two) times daily.   5 mL   12   . ranitidine (ZANTAC) 150 MG capsule   Oral   Take 1 capsule (150 mg total) by mouth 2 (two) times daily.   60 capsule   11   . SYMBICORT  160-4.5 MCG/ACT inhaler   Inhalation   Inhale 160 puffs into the lungs daily.         Marland Kitchen thiamine (VITAMIN B-1) 100 MG tablet   Oral   Take 100 mg by mouth daily.         Marland Kitchen tiotropium (SPIRIVA) 18 MCG inhalation capsule   Inhalation   Place 1 capsule (18 mcg total) into inhaler and inhale daily.   90 capsule   3   . topiramate (TOPAMAX) 25 MG tablet      1 tablet at night for a week, then take 1 tablet twice daily   180 tablet   0   . traZODone (DESYREL) 100 MG tablet      TAKE ONE TABLET BY MOUTH EVERY NIGHT AT BEDTIME   90 tablet   1    BP 123/50  Pulse 97  Temp(Src) 99.2 F (37.3 C) (Oral)  Resp 16  SpO2 100%  Vital signs normal except for low-grade temp and tachypnea  Physical Exam  Nursing note and vitals reviewed. Constitutional: He is oriented to person, place, and time. He appears well-developed and well-nourished.  Non-toxic appearance. He does not appear ill. No distress.  Pt examined after his first nebulizer  HENT:  Head: Normocephalic and atraumatic.  Right Ear: External ear normal.  Left Ear: External ear normal.  Nose: Nose normal. No mucosal edema or rhinorrhea.  Mouth/Throat: Oropharynx is clear and moist and mucous membranes are normal. No dental abscesses or uvula swelling.  Eyes: Conjunctivae and EOM are normal. Pupils are equal, round, and reactive to light.  Neck: Normal range of motion and full passive range of motion without pain. Neck supple.  Cardiovascular: Normal rate, regular rhythm and normal heart sounds.  Exam reveals no gallop and no friction rub.   No murmur heard. Pulmonary/Chest: Accessory muscle usage present. Tachypnea noted. He is in respiratory distress. He has decreased breath sounds. He has wheezes. He has no rhonchi. He has rales. He exhibits no tenderness and no crepitus.    Area of pain noted  Abdominal: Soft. Normal appearance and bowel sounds are normal. He exhibits no distension. There is no tenderness. There is  no rebound and no guarding.  Musculoskeletal: Normal range of motion. He exhibits no edema and no tenderness.  Moves all extremities well.   Neurological: He is alert and oriented to person, place, and time. He has normal strength. No cranial nerve deficit.  Skin: Skin is warm, dry and intact. No rash noted. No erythema. No pallor.  Hyperemia of palms, spider angiomas on her back  Psychiatric: He has a normal mood and affect. His speech is normal and behavior is normal. His mood appears not anxious.    ED Course  Procedures (including critical care time)   Medications  0.9 %  sodium chloride infusion ( Intravenous New Bag/Given 08/30/13 1145)  cefTRIAXone (ROCEPHIN) 1 g in dextrose 5 % 50 mL IVPB (not administered)  azithromycin (ZITHROMAX) 500 mg in dextrose 5 % 250 mL IVPB (not administered)  albuterol (PROVENTIL) (5 MG/ML) 0.5% nebulizer solution 5 mg (5 mg Nebulization Given 08/30/13 1032)  albuterol (PROVENTIL,VENTOLIN) solution continuous neb (15 mg/hr Nebulization Given 08/30/13 1140)  ipratropium (ATROVENT) nebulizer solution 0.5 mg (0.5 mg Nebulization Given 08/30/13 1139)  methylPREDNISolone sodium succinate (SOLU-MEDROL) 125 mg/2 mL injection 125 mg (125 mg Intravenous Given 08/30/13 1145)  sodium chloride 0.9 % bolus 700 mL (700 mLs Intravenous New Bag/Given 08/30/13 1305)    Patient started on a continuous nebulizer to try to improve his bronchospasm. After review of his labs he also was given IV bolus of fluid for his worsening of his baseline renal insufficiency.  Recheck at 13:30 just finishing his continuous nebulizer, has improved air movement, still has diffuse wheezing and rhonchi and still appears to have some mild tachypnea, he is improved but not improved enough for discharge.  Patient started on antibiotics for underlying bronchitis and she is coughing up green mucus and bronchitis is most likely the reason for his exacerbation of his COPD.  13:52 Dr Vanessa Barbara admit  to tele, team 10  Labs Review Results for orders placed during the hospital encounter of 08/30/13  CBC      Result Value Range   WBC 14.9 (*) 4.0 - 10.5 K/uL   RBC 3.13 (*) 4.22 - 5.81 MIL/uL   Hemoglobin 9.7 (*) 13.0 - 17.0 g/dL   HCT 04.5 (*) 40.9 - 81.1 %   MCV 87.9  78.0 - 100.0 fL   MCH 31.0  26.0 - 34.0 pg   MCHC 35.3  30.0 - 36.0 g/dL   RDW 91.4  78.2 - 95.6 %   Platelets 186  150 - 400 K/uL  BASIC METABOLIC PANEL      Result Value Range   Sodium 135  135 - 145 mEq/L   Potassium 4.6  3.5 - 5.1 mEq/L   Chloride 99  96 - 112 mEq/L   CO2 21  19 - 32 mEq/L   Glucose, Bld 317 (*) 70 - 99 mg/dL   BUN 36 (*) 6 - 23 mg/dL   Creatinine, Ser 2.13 (*) 0.50 - 1.35 mg/dL   Calcium 8.8  8.4 - 08.6 mg/dL   GFR calc non Af Amer 27 (*) >90 mL/min   GFR calc Af Amer 31 (*) >90 mL/min  PRO B NATRIURETIC PEPTIDE      Result Value Range   Pro B Natriuretic peptide (BNP) 236.3 (*) 0 - 125 pg/mL  POCT I-STAT TROPONIN I      Result Value Range   Troponin i, poc 0.01  0.00 - 0.08 ng/mL   Comment 3            Laboratory interpretation all normal except leukocytosis, hyperglycemia,  increase in his underlying renal insufficiency, nonsignificant elevation of BNP    Imaging Review Dg Chest 2 View (if Patient Has Fever And/or Copd)  08/30/2013   CLINICAL DATA:  Shortness of breath, cough, COPD  EXAM: CHEST  2 VIEW  COMPARISON:  01/14/2013  FINDINGS: Mild cardiac enlargement stable. No pleural effusion or consolidation. There are increased bilateral perihilar markings. This is also similar when compared to the prior study.  IMPRESSION: Chronic interstitial change. No acute findings.   Electronically Signed   By: Esperanza Heir M.D.   On: 08/30/2013 13:05    EKG Interpretation  Ventricular Rate:  103 PR Interval:  164 QRS Duration: 68 QT Interval:  336 QTC Calculation: 440 R Axis:   32 Text Interpretation:  Sinus tachycardia Nonspecific T wave abnormality No significant change since  last tracing            MDM   1. COPD with exacerbation   2. Bronchitis   3. Hyperglycemia   4. Acute on chronic renal insufficiency    Plan admission  Devoria Albe, MD, FACEP  CRITICAL CARE Performed by: Devoria Albe L Total critical care time: 31 min Critical care time was exclusive of separately billable procedures and treating other patients. Critical care was necessary to treat or prevent imminent or life-threatening deterioration. Critical care was time spent personally by me on the following activities: development of treatment plan with patient and/or surrogate as well as nursing, discussions with consultants, evaluation of patient's response to treatment, examination of patient, obtaining history from patient or surrogate, ordering and performing treatments and interventions, ordering and review of laboratory studies, ordering and review of radiographic studies, pulse oximetry and re-evaluation of patient's condition.   Ward Givens, MD 08/30/13 1404  Ward Givens, MD 08/30/13 1418

## 2013-08-30 NOTE — ED Notes (Signed)
Per pt sts for the past few days, SOB, wheezing. sts productive cough. sts has been using inhalers at home without relief. Pt uses home o2 at night and when needed. sats 92% at triage.

## 2013-08-30 NOTE — ED Notes (Signed)
Patient transported to X-ray 

## 2013-08-31 DIAGNOSIS — N179 Acute kidney failure, unspecified: Secondary | ICD-10-CM

## 2013-08-31 DIAGNOSIS — J9600 Acute respiratory failure, unspecified whether with hypoxia or hypercapnia: Principal | ICD-10-CM

## 2013-08-31 DIAGNOSIS — R569 Unspecified convulsions: Secondary | ICD-10-CM

## 2013-08-31 DIAGNOSIS — R1013 Epigastric pain: Secondary | ICD-10-CM

## 2013-08-31 DIAGNOSIS — N189 Chronic kidney disease, unspecified: Secondary | ICD-10-CM

## 2013-08-31 LAB — BASIC METABOLIC PANEL
CO2: 21 mEq/L (ref 19–32)
Calcium: 9.4 mg/dL (ref 8.4–10.5)
Chloride: 102 mEq/L (ref 96–112)
Creatinine, Ser: 2.12 mg/dL — ABNORMAL HIGH (ref 0.50–1.35)
GFR calc non Af Amer: 31 mL/min — ABNORMAL LOW (ref >90)
Glucose, Bld: 433 mg/dL — ABNORMAL HIGH (ref 70–99)

## 2013-08-31 LAB — GLUCOSE, CAPILLARY
Glucose-Capillary: 381 mg/dL — ABNORMAL HIGH (ref 70–99)
Glucose-Capillary: 405 mg/dL — ABNORMAL HIGH (ref 70–99)
Glucose-Capillary: 423 mg/dL — ABNORMAL HIGH (ref 70–99)
Glucose-Capillary: 457 mg/dL — ABNORMAL HIGH (ref 70–99)
Glucose-Capillary: 507 mg/dL — ABNORMAL HIGH (ref 70–99)

## 2013-08-31 LAB — HEMOGLOBIN A1C
Hgb A1c MFr Bld: 7.8 % — ABNORMAL HIGH (ref <5.7)
Mean Plasma Glucose: 177 mg/dL — ABNORMAL HIGH (ref <117)

## 2013-08-31 LAB — CBC
HCT: 30.6 % — ABNORMAL LOW (ref 39.0–52.0)
MCV: 89 fL (ref 78.0–100.0)
Platelets: 229 10*3/uL (ref 150–400)
RDW: 13.5 % (ref 11.5–15.5)
WBC: 18.5 10*3/uL — ABNORMAL HIGH (ref 4.0–10.5)

## 2013-08-31 MED ORDER — INSULIN ASPART 100 UNIT/ML ~~LOC~~ SOLN
0.0000 [IU] | Freq: Three times a day (TID) | SUBCUTANEOUS | Status: DC
  Administered 2013-08-31 – 2013-09-01 (×6): 20 [IU] via SUBCUTANEOUS
  Administered 2013-09-02: 07:00:00 11 [IU] via SUBCUTANEOUS
  Administered 2013-09-02: 20 [IU] via SUBCUTANEOUS

## 2013-08-31 MED ORDER — BUDESONIDE 0.25 MG/2ML IN SUSP
0.2500 mg | Freq: Two times a day (BID) | RESPIRATORY_TRACT | Status: DC
  Administered 2013-08-31 – 2013-09-02 (×4): 0.25 mg via RESPIRATORY_TRACT
  Filled 2013-08-31 (×8): qty 2

## 2013-08-31 MED ORDER — INSULIN ASPART 100 UNIT/ML ~~LOC~~ SOLN
20.0000 [IU] | Freq: Once | SUBCUTANEOUS | Status: AC
  Administered 2013-08-31: 03:00:00 20 [IU] via SUBCUTANEOUS

## 2013-08-31 MED ORDER — ALBUTEROL SULFATE (5 MG/ML) 0.5% IN NEBU
2.5000 mg | INHALATION_SOLUTION | Freq: Four times a day (QID) | RESPIRATORY_TRACT | Status: DC
  Administered 2013-08-31 – 2013-09-02 (×6): 2.5 mg via RESPIRATORY_TRACT
  Filled 2013-08-31 (×6): qty 0.5

## 2013-08-31 MED ORDER — INSULIN ASPART 100 UNIT/ML ~~LOC~~ SOLN: 0.0000 [IU] | Freq: Every day | SUBCUTANEOUS | Status: DC

## 2013-08-31 MED ORDER — PANTOPRAZOLE SODIUM 40 MG PO TBEC
40.0000 mg | DELAYED_RELEASE_TABLET | Freq: Two times a day (BID) | ORAL | Status: DC
  Administered 2013-08-31 – 2013-09-02 (×4): 40 mg via ORAL
  Filled 2013-08-31 (×4): qty 1

## 2013-08-31 MED ORDER — GUAIFENESIN ER 600 MG PO TB12
600.0000 mg | ORAL_TABLET | Freq: Two times a day (BID) | ORAL | Status: DC
  Administered 2013-08-31 – 2013-09-02 (×5): 600 mg via ORAL
  Filled 2013-08-31 (×6): qty 1

## 2013-08-31 MED ORDER — IPRATROPIUM BROMIDE 0.02 % IN SOLN
0.5000 mg | Freq: Four times a day (QID) | RESPIRATORY_TRACT | Status: DC
  Administered 2013-09-01 – 2013-09-02 (×5): 0.5 mg via RESPIRATORY_TRACT
  Filled 2013-08-31 (×4): qty 2.5

## 2013-08-31 MED ORDER — INSULIN GLARGINE 100 UNIT/ML ~~LOC~~ SOLN
40.0000 [IU] | Freq: Two times a day (BID) | SUBCUTANEOUS | Status: DC
  Administered 2013-08-31 – 2013-09-02 (×4): 40 [IU] via SUBCUTANEOUS
  Filled 2013-08-31 (×5): qty 0.4

## 2013-08-31 NOTE — Progress Notes (Signed)
UR completed. Nimco Bivens RN CCM Case Mgmt 

## 2013-08-31 NOTE — Progress Notes (Signed)
Inpatient Diabetes Program Recommendations  AACE/ADA: New Consensus Statement on Inpatient Glycemic Control (2013)  Target Ranges:  Prepandial:   less than 140 mg/dL      Peak postprandial:   less than 180 mg/dL (1-2 hours)      Critically ill patients:  140 - 180 mg/dL   Reason for Assessment: Hyperglycemia  Results for DOM, [place] (MRN [id number]) as of 08/31/2013 15:02  Ref. Range 08/30/2013 21:01 08/31/2013 00:01 08/31/2013 01:52 08/31/2013 06:02 08/31/2013 11:22  Glucose-Capillary Latest Range: 70-99 mg/dL >782 (HH) 956 (H) 213 (H) 405 (H) 355 (H)   Note:  Lantus has been increased to 40 units BID.  May also benefit from meal coverage tid in addition to correction-- perhaps 5 units tid with titration as indicated.  Thank you.  Hansini Clodfelter S. Elsie Lincoln, RN, CNS, CDE Inpatient Diabetes Program, team pager [phone]

## 2013-08-31 NOTE — Plan of Care (Signed)
Problem: Limited Adherence to Nutrition-Related Recommendations (NB-1.6) Goal: Nutrition education Formal process to instruct or train a patient/client in a skill or to impart knowledge to help patients/clients voluntarily manage or modify food choices and eating behavior to maintain or improve health. Outcome: Completed/Met Date Met:  08/31/13  RD consulted for nutrition education regarding diabetes.     Lab Results  Component Value Date    HGBA1C 7.7* 04/03/2012    RD provided "Carbohydrate Counting for People with Diabetes" & "Diabetes Label Reading Tips" handouts from the Academy of Nutrition and Dietetics.  Per diet recall, patient usually drinks diet soda but also consumes regular orange & apple juice -- RD recommended diet juice (Ocean Spray brand) or Crystal Light.  Patient also admits to loving sweets and snacking on chips.  He does bake his meats.  RD emphasized carbohydrate portion control at meals and limitation of sweets.  Also encouraged physical activity, however, patient has limited options given his medical history (shortness of breath).  Teach back method used.  Expect fair compliance.  Body mass index is 39.25 kg/(m^2). Pt meets criteria for Obesity Class II based on current BMI.  Current diet order is Carbohydrate Modified Medium Calorie, patient is consuming approximately 100% of meals at this time.  Labs and medications reviewed.  No further nutrition interventions warranted at this time.  If additional nutrition issues arise, please re-consult RD.  Maureen Chatters, RD, LDN Pager #: [phone] After-Hours Pager #: [phone]

## 2013-08-31 NOTE — Progress Notes (Signed)
[facility] PROGRESS NOTE  [name] ZOX:[id number] DOB: 21-Aug-1951 DOA: 08/30/2013 PCP: Leo Grosser, MD  Assessment/Plan:  Acute respiratory failure with hypoxia due to chronic obstructive pulmonary disease exacerbation: Most likely secondary to bronchitis/bronchiectasis. -Will continue dual nebs -Patient will be started on Pulmicort and Mucinex -Continue Solu Medrol and antibiotics   Acute on chronic renal failure: Patient with a history of stage II chronic kidney disease, appearing to have a baseline creatinine between 1.4 and 1.6. Lab work showing a creatinine of 2.44. I suspect may be related to prerenal azotemia. -Continue IV fluids -Creatinine today (08/31/13) is improved to 2.1 -Will follow creatinine trend   Gastroesophageal reflux disease.: Continue PPI. Medication has been adjusted to twice a day for better control.   History of Coronary artery disease: No abnormalities has been seen on telemetry. We'll discontinue telemetry tomorrow morning if no further changes. -Will continue aspirin and statin -Patient advised to quit smoking. -Continue good control of his diabetes.   Insulin-dependent diabetes mellitus: Poorly controlled. -Will check hemoglobin A1c -Continue sliding scale insulin for assistance (4 obvious patient and on steroids treatment) -Will increase Lantus to 40 units twice a day -Follow CBGs and continue adjusting insulin therapy as needed.   Hypertension. Continue diltiazem 120 mg by mouth daily. Blood pressure currently stable   History of seizure disorder and migraines. Will continue Topamax 25 mg by mouth daily. No seizure activity has been appreciated and the patient denies any headaches.  DVT prophylaxis. Lovenox 40 mg subcutaneous daily  Code Status: full Family Communication: no family at bedside Disposition Plan: Home when medically stable  Consultants:  None  Procedures:  See below for x-ray  reports  Antibiotics:  Rocephin and Zithromax  HPI/Subjective: Afebrile, feeling slightly better; but still with poor air movement, wheezing and unable to speak in full sentences  Objective: Filed Vitals:   08/31/13 1043  BP: 120/60  Pulse:   Temp:   Resp:     Intake/Output Summary (Last 24 hours) at 08/31/13 1329 Last data filed at 08/31/13 1133  Gross per 24 hour  Intake    600 ml  Output   3125 ml  Net  -2525 ml   Filed Weights   08/30/13 1515 08/31/13 0539  Weight: 128.7 kg (283 lb 11.7 oz) 127.6 kg (281 lb 4.9 oz)    Exam:   General:  NAD, still unable to speak in full sentences and with SOB with minimal exertion  Cardiovascular: S1 and S2, no rubs or gallops  Respiratory: poor air movement, exp wheezing, no crackles  Abdomen: obese, soft, NT, ND, positive BS  Musculoskeletal: bilateral rib cage pain due to coughing spells  Data Reviewed: Basic Metabolic Panel:  Recent Labs Lab 08/30/13 1129 08/31/13 0600  NA 135 138  K 4.6 4.5  CL 99 102  CO2 21 21  GLUCOSE 317* 433*  BUN 36* 43*  CREATININE 2.44* 2.12*  CALCIUM 8.8 9.4   CBC:  Recent Labs Lab 08/30/13 1129 08/31/13 0600  WBC 14.9* 18.5*  HGB 9.7* 10.4*  HCT 27.5* 30.6*  MCV 87.9 89.0  PLT 186 229   BNP (last 3 results)  Recent Labs  12/12/12 1543 12/26/12 1133 08/30/13 1129  PROBNP 787.2* 209.0* 236.3*   CBG:  Recent Labs Lab 08/30/13 2101 08/31/13 0001 08/31/13 0152 08/31/13 0602 08/31/13 1122  GLUCAP >600* 507* 457* 405* 355*    No results found for this or any previous visit (from the past 240 hour(s)).   Studies: Dg Chest  2 View (if Patient Has Fever And/or Copd)  08/30/2013   CLINICAL DATA:  Shortness of breath, cough, COPD  EXAM: CHEST  2 VIEW  COMPARISON:  01/14/2013  FINDINGS: Mild cardiac enlargement stable. No pleural effusion or consolidation. There are increased bilateral perihilar markings. This is also similar when compared to the prior study.   IMPRESSION: Chronic interstitial change. No acute findings.   Electronically Signed   By: Esperanza Heir M.D.   On: 08/30/2013 13:05    Scheduled Meds: . ipratropium  0.5 mg Nebulization Q4H   And  . albuterol  2.5 mg Nebulization Q4H  . aspirin EC  81 mg Oral Daily  . atorvastatin  20 mg Oral Daily  . azithromycin  500 mg Intravenous Q24H  . budesonide (PULMICORT) nebulizer solution  0.25 mg Nebulization BID  . cefTRIAXone (ROCEPHIN)  IV  1 g Intravenous Q24H  . diltiazem  120 mg Oral Daily  . enoxaparin (LOVENOX) injection  40 mg Subcutaneous Q24H  . FLUoxetine  40 mg Oral Daily  . gabapentin  400 mg Oral QHS  . insulin aspart  0-20 Units Subcutaneous TID WC  . insulin glargine  40 Units Subcutaneous BID  . methylPREDNISolone (SOLU-MEDROL) injection  60 mg Intravenous Q8H  . nicotine  21 mg Transdermal Daily  . olopatadine  1 drop Both Eyes BID  . pantoprazole  40 mg Oral BID  . sodium chloride  3 mL Intravenous Q12H  . topiramate  25 mg Oral BID  . traZODone  100 mg Oral QHS   Continuous Infusions: . sodium chloride 50 mL/hr (08/31/13 1038)    Active Problems:   Non-small cell carcinoma of lung, stage 1   Hypertension   GERD (gastroesophageal reflux disease)   Pneumonia   Type II diabetes mellitus   COPD with exacerbation   Acute on chronic renal failure   OSA (obstructive sleep apnea)   Time spent: >30 minutes   Seng Fouts  [facility] Pager [phone]. If 7PM-7AM, please contact night-coverage at [email or web address], password [facility] 08/31/2013, 1:29 PM  LOS: 1 day

## 2013-09-01 DIAGNOSIS — J180 Bronchopneumonia, unspecified organism: Secondary | ICD-10-CM

## 2013-09-01 DIAGNOSIS — I10 Essential (primary) hypertension: Secondary | ICD-10-CM

## 2013-09-01 DIAGNOSIS — G4733 Obstructive sleep apnea (adult) (pediatric): Secondary | ICD-10-CM

## 2013-09-01 LAB — LIPID PANEL
Cholesterol: 164 mg/dL (ref 0–200)
HDL: 30 mg/dL — ABNORMAL LOW (ref >39)
LDL Cholesterol: 81 mg/dL (ref 0–99)
Triglycerides: 263 mg/dL — ABNORMAL HIGH (ref <150)
VLDL: 53 mg/dL — ABNORMAL HIGH (ref 0–40)

## 2013-09-01 LAB — BASIC METABOLIC PANEL
BUN: 43 mg/dL — ABNORMAL HIGH (ref 6–23)
CO2: 19 mEq/L (ref 19–32)
Calcium: 9.2 mg/dL (ref 8.4–10.5)
Creatinine, Ser: 1.68 mg/dL — ABNORMAL HIGH (ref 0.50–1.35)
GFR calc non Af Amer: 42 mL/min — ABNORMAL LOW (ref >90)
Glucose, Bld: 450 mg/dL — ABNORMAL HIGH (ref 70–99)
Potassium: 4.5 mEq/L (ref 3.5–5.1)
Sodium: 129 mEq/L — ABNORMAL LOW (ref 135–145)

## 2013-09-01 LAB — GLUCOSE, CAPILLARY
Glucose-Capillary: 408 mg/dL — ABNORMAL HIGH (ref 70–99)
Glucose-Capillary: 396 mg/dL — ABNORMAL HIGH (ref 70–99)
Glucose-Capillary: 371 mg/dL — ABNORMAL HIGH (ref 70–99)

## 2013-09-01 MED ORDER — INSULIN ASPART 100 UNIT/ML ~~LOC~~ SOLN
20.0000 [IU] | Freq: Three times a day (TID) | SUBCUTANEOUS | Status: DC
  Administered 2013-09-02 (×2): 20 [IU] via SUBCUTANEOUS

## 2013-09-01 MED ORDER — METHYLPREDNISOLONE SODIUM SUCC 125 MG IJ SOLR
60.0000 mg | Freq: Two times a day (BID) | INTRAMUSCULAR | Status: DC
  Administered 2013-09-02: 60 mg via INTRAVENOUS
  Filled 2013-09-01 (×3): qty 0.96

## 2013-09-01 NOTE — Progress Notes (Signed)
Inpatient Diabetes Program Recommendations  AACE/ADA: New Consensus Statement on Inpatient Glycemic Control (2013)  Target Ranges:  Prepandial:   less than 140 mg/dL      Peak postprandial:   less than 180 mg/dL (1-2 hours)      Critically ill patients:  140 - 180 mg/dL   Reason for Assessment: Severe Hyperglycemia-- probably intensified by solu-medrol  Results for [name], [name] (MRN [id number]) as of 09/01/2013 11:24  Ref. Range 08/31/2013 06:02 08/31/2013 11:22 08/31/2013 15:59 08/31/2013 20:32 09/01/2013 06:05 09/01/2013 10:47  Glucose-Capillary Latest Range: 70-99 mg/dL 409 (H) 811 (H) 914 (H) 381 (H) 414 (H) 408 (H)   Note:  Lantus dosage increased from 60 units daily to 40 units bid.  Received 60 units yesterday at 10 am and began 40 unit dose at HS-- so actually received a total of 100 units of Lantus yesterday.  Despite extra Lantus, CBG before breakfast was 414 mg/dl.    Patient routinely receiving 20 units of correction tid at mealtimes without desired effect.  Suggest MD consider ordering Novolog 20 units tid with meals as meal coverage in addition to correction scale.  The solu-medrol is making the patient more resistant to insulin and more intolerant of CHO.  Insulin regimen will need continued evaluation daily with titration based on CBG's.    Thank you. Bobbijo Holst S. Elsie Lincoln, RN, CNS, CDE Inpatient Diabetes Program, team pager [phone]

## 2013-09-01 NOTE — Progress Notes (Signed)
Patient placed himself on bipap via full face mask with 2lpm 02 bleed in. Patient is tolerating bipap well at this time.

## 2013-09-01 NOTE — Progress Notes (Addendum)
[facility] PROGRESS NOTE  HAMPTON WIXOM ONG:[id number] DOB: 09/09/1951 DOA: 08/30/2013 PCP: Leo Grosser, MD  Assessment/Plan:  Acute respiratory failure with hypoxia due to chronic obstructive pulmonary disease exacerbation: Most likely secondary to bronchitis/bronchiectasis. -Will continue dual nebs -Patient started on Pulmicort and Mucinex -Continue Solu Medrol (adjust dose to BID) and antibiotics -OSA/OHS contributing.   Acute on chronic renal failure: Patient with a history of stage II chronic kidney disease, appearing to have a baseline creatinine between 1.4 and 1.6. Lab work showing a creatinine of 2.44. I suspect may be related to prerenal azotemia. -Cr 1.68; will adjust IV fluids rate -Will follow creatinine trend   Gastroesophageal reflux disease.: Continue PPI. Medication has been adjusted to twice a day for better control.   History of Coronary artery disease: No abnormalities has been seen on telemetry. Will discontinue telemetry tomorrow morning if no further changes. -Will continue aspirin and statin -Patient advised to quit smoking. -Continue good control of his diabetes.   Insulin-dependent diabetes mellitus: Poorly controlled. -Continue sliding scale insulin for assistance (4 obvious patient and on steroids treatment) -Will continue Lantus to 40 units twice a day -add meal coverage 20units TID. -Follow CBGs and continue adjusting insulin therapy as needed. -A1C 7.8 -start to titrate steroids   Hypertension. Continue diltiazem 120 mg by mouth daily. Blood pressure currently stable.   History of seizure disorder and migraines. Will continue Topamax 25 mg by mouth daily. No seizure activity has been appreciated and the patient denies any headaches.   OSA: continue QHS CPAP   Hyponatremia: pseudo-hyponatremia from hyperglycemia. Corrected sodium 134. -will correct sugar and follow BMET in am  DVT prophylaxis. Lovenox 40 mg subcutaneous  daily  Code Status: full Family Communication: no family at bedside Disposition Plan: Home when medically stable  Consultants:  None  Procedures:  See below for x-ray reports  Antibiotics:  Rocephin and Zithromax  HPI/Subjective: Afebrile, feeling better; breathing improved and moving more air; still having some difficulty speaking in full sentences.  Objective: Filed Vitals:   09/01/13 1336  BP: 130/50  Pulse: 88  Temp: 97.1 F (36.2 C)  Resp: 20    Intake/Output Summary (Last 24 hours) at 09/01/13 1825 Last data filed at 09/01/13 1732  Gross per 24 hour  Intake   1613 ml  Output   1450 ml  Net    163 ml   Filed Weights   08/30/13 1515 08/31/13 0539 09/01/13 0514  Weight: 128.7 kg (283 lb 11.7 oz) 127.6 kg (281 lb 4.9 oz) 127.234 kg (280 lb 8 oz)    Exam:   General:  NAD, still unable to speak in full sentences and with SOB with minimal exertion  Cardiovascular: S1 and S2, no rubs or gallops  Respiratory: improved air movement, mild exp wheezing, no crackles  Abdomen: obese, soft, NT, ND, positive BS  Musculoskeletal: bilateral rib cage pain due to coughing spells  Data Reviewed: Basic Metabolic Panel:  Recent Labs Lab 08/30/13 1129 08/31/13 0600 09/01/13 0521  NA 135 138 129*  K 4.6 4.5 4.5  CL 99 102 99  CO2 21 21 19   GLUCOSE 317* 433* 450*  BUN 36* 43* 43*  CREATININE 2.44* 2.12* 1.68*  CALCIUM 8.8 9.4 9.2   CBC:  Recent Labs Lab 08/30/13 1129 08/31/13 0600  WBC 14.9* 18.5*  HGB 9.7* 10.4*  HCT 27.5* 30.6*  MCV 87.9 89.0  PLT 186 229   BNP (last 3 results)  Recent Labs  12/12/12 1543 12/26/12 1133  08/30/13 1129  PROBNP 787.2* 209.0* 236.3*   CBG:  Recent Labs Lab 08/31/13 1559 08/31/13 2032 09/01/13 0605 09/01/13 1047 09/01/13 1620  GLUCAP 423* 381* 414* 408* 396*    Recent Results (from the past 240 hour(s))  CULTURE, BLOOD (ROUTINE X 2)     Status: None   Collection Time    08/30/13  6:00 PM       Result Value Range Status   Specimen Description BLOOD LEFT ARM   Final   Special Requests     Final   Value: BOTTLES DRAWN AEROBIC AND ANAEROBIC 10CC BLUE, 5CC RED   Culture  Setup Time     Final   Value: 08/31/2013 00:28     Performed at [facility]   Culture     Final   Value:        BLOOD CULTURE RECEIVED NO GROWTH TO DATE CULTURE WILL BE HELD FOR 5 DAYS BEFORE ISSUING A FINAL NEGATIVE REPORT     Performed at [facility]   Report Status PENDING   Incomplete  CULTURE, BLOOD (ROUTINE X 2)     Status: None   Collection Time    08/30/13  6:24 PM      Result Value Range Status   Specimen Description BLOOD LEFT HAND   Final   Special Requests BOTTLES DRAWN AEROBIC ONLY 10CC   Final   Culture  Setup Time     Final   Value: 08/31/2013 00:28     Performed at [facility]   Culture     Final   Value:        BLOOD CULTURE RECEIVED NO GROWTH TO DATE CULTURE WILL BE HELD FOR 5 DAYS BEFORE ISSUING A FINAL NEGATIVE REPORT     Performed at [facility]   Report Status PENDING   Incomplete     Studies: No results found.  Scheduled Meds: . albuterol  2.5 mg Nebulization QID  . aspirin EC  81 mg Oral Daily  . atorvastatin  20 mg Oral Daily  . azithromycin  500 mg Intravenous Q24H  . budesonide (PULMICORT) nebulizer solution  0.25 mg Nebulization BID  . cefTRIAXone (ROCEPHIN)  IV  1 g Intravenous Q24H  . diltiazem  120 mg Oral Daily  . enoxaparin (LOVENOX) injection  40 mg Subcutaneous Q24H  . FLUoxetine  40 mg Oral Daily  . gabapentin  400 mg Oral QHS  . guaiFENesin  600 mg Oral BID  . insulin aspart  0-20 Units Subcutaneous TID WC  . [START ON 09/02/2013] insulin aspart  20 Units Subcutaneous TID WC  . insulin glargine  40 Units Subcutaneous BID  . ipratropium  0.5 mg Nebulization QID  . [START ON 09/02/2013] methylPREDNISolone (SOLU-MEDROL) injection  60 mg Intravenous Q12H  . nicotine  21 mg Transdermal Daily  . olopatadine  1 drop Both Eyes BID   . pantoprazole  40 mg Oral BID  . sodium chloride  3 mL Intravenous Q12H  . topiramate  25 mg Oral BID  . traZODone  100 mg Oral QHS   Continuous Infusions: . sodium chloride 50 mL/hr at 09/01/13 3086    Active Problems:   Non-small cell carcinoma of lung, stage 1   Hypertension   GERD (gastroesophageal reflux disease)   Pneumonia   Type II diabetes mellitus   COPD with exacerbation   Acute on chronic renal failure   OSA (obstructive sleep apnea)   Time spent: >30 minutes  [facility]  [facility] Pager [phone]. If 7PM-7AM, please contact night-coverage at [email or web address], password [facility] 09/01/2013, 6:25 PM  LOS: 2 days

## 2013-09-02 ENCOUNTER — Encounter (HOSPITAL_COMMUNITY): Payer: Self-pay | Admitting: Student

## 2013-09-02 LAB — BASIC METABOLIC PANEL
BUN: 46 mg/dL — ABNORMAL HIGH (ref 6–23)
Chloride: 103 mEq/L (ref 96–112)
Creatinine, Ser: 1.75 mg/dL — ABNORMAL HIGH (ref 0.50–1.35)
Glucose, Bld: 328 mg/dL — ABNORMAL HIGH (ref 70–99)
Potassium: 4.8 mEq/L (ref 3.5–5.1)

## 2013-09-02 LAB — GLUCOSE, CAPILLARY: Glucose-Capillary: 400 mg/dL — ABNORMAL HIGH (ref 70–99)

## 2013-09-02 MED ORDER — INSULIN GLARGINE 100 UNIT/ML ~~LOC~~ SOLN: 80.0000 [IU] | Freq: Every morning | SUBCUTANEOUS | Status: DC

## 2013-09-02 MED ORDER — ALBUTEROL SULFATE (5 MG/ML) 0.5% IN NEBU: 2.5000 mg | INHALATION_SOLUTION | RESPIRATORY_TRACT | Status: DC | PRN

## 2013-09-02 MED ORDER — INSULIN GLARGINE 100 UNIT/ML ~~LOC~~ SOLN
20.0000 [IU] | Freq: Once | SUBCUTANEOUS | Status: DC
  Filled 2013-09-02: qty 0.2

## 2013-09-02 MED ORDER — ALBUTEROL SULFATE (2.5 MG/3ML) 0.083% IN NEBU: 2.5000 mg | INHALATION_SOLUTION | RESPIRATORY_TRACT | Status: DC | PRN

## 2013-09-02 MED ORDER — INSULIN GLARGINE 100 UNIT/ML ~~LOC~~ SOLN: 60.0000 [IU] | Freq: Two times a day (BID) | SUBCUTANEOUS | Status: DC

## 2013-09-02 MED ORDER — ALBUTEROL SULFATE (5 MG/ML) 0.5% IN NEBU
2.5000 mg | INHALATION_SOLUTION | Freq: Three times a day (TID) | RESPIRATORY_TRACT | Status: DC
  Administered 2013-09-02: 15:00:00 2.5 mg via RESPIRATORY_TRACT
  Filled 2013-09-02: qty 0.5

## 2013-09-02 MED ORDER — MOXIFLOXACIN HCL 400 MG PO TABS: 400.0000 mg | ORAL_TABLET | Freq: Every day | ORAL | Status: DC

## 2013-09-02 MED ORDER — PREDNISONE 10 MG PO TABS: ORAL_TABLET | ORAL | Status: DC

## 2013-09-02 MED ORDER — IPRATROPIUM BROMIDE 0.02 % IN SOLN
0.5000 mg | Freq: Three times a day (TID) | RESPIRATORY_TRACT | Status: DC
  Administered 2013-09-02: 0.5 mg via RESPIRATORY_TRACT
  Filled 2013-09-02: qty 2.5

## 2013-09-02 NOTE — Discharge Summary (Signed)
Physician Discharge Summary  [name]:[id number] DOB: [birth year] DOA: 08/30/2013  PCP: Leo Grosser, MD  Admit date: 08/30/2013 Discharge date: 09/02/2013  Time spent: 35 minutes  Recommendations for Outpatient Follow-up:  1. Follow up with PCP Evaluate CBG's and titrate meds as tolerate it. ] Discharge Diagnoses:  Active Problems:   Non-small cell carcinoma of lung, stage 1   Hypertension   GERD (gastroesophageal reflux disease)   Pneumonia   Type II diabetes mellitus   COPD with exacerbation   Acute on chronic renal failure   OSA (obstructive sleep apnea)   Discharge Condition: stable  Diet recommendation: heart healthy  Filed Weights   08/31/13 0539 09/01/13 0514 09/02/13 0516  Weight: 127.6 kg (281 lb 4.9 oz) 127.234 kg (280 lb 8 oz) 127.5 kg (281 lb 1.4 oz)    History of present illness:  62 y.o. male with multiple comorbidities including type 2 diabetes mellitus, obstructive sleep apnea, coronary artery disease who presents emergency department today with complaints of cough, wheezing, shortness of breath. He states having productive cough with green sputum production, not exertional dyspnea, subjective fevers chills, malaise, fatigue, as well as wheezing, which have become progressively worse over the past week. He also complains of subjective fevers and chills as well as chest pain precipitated by cough and deep inspiration. Patient has not been on recent antibiotic therapy. He does not utilize home oxygen although wears BiPAP at nighttime for obstructive sleep apnea. States using nebulizers at home which helped for a short period of time. Unfortunately he continues to smoke about a pack of cigarettes a day. In the emergency room he was administered Solu-Medrol 125 mg IV x1 along with ceftriaxone and azithromycin.      Hospital Course:  Acute respiratory failure with hypoxia due to chronic obstructive pulmonary disease exacerbation:  - Most likely  secondary to bronchitis/bronchiectasis. - Will continue dual nebs  - Patient started on Pulmicort and Mucinex  - Changed to prednisone tapered. and oral antibiotics  - OSA/OHS contributing.   Acute on chronic renal failure:  - Patient with a history of stage II chronic kidney disease, appearing to have a baseline creatinine between 1.4 and 1.6.  - Lab work on admission showing a creatinine of 2.44. -Cr 1.68;   Gastroesophageal reflux disease:  - Continue PPI. Medication has been adjusted to twice a day for better control.  History of Coronary artery disease:  - No abnormalities has been seen on telemetry. Will discontinue telemetry tomorrow morning if no further changes. -Will continue aspirin and statin  -Patient advised to quit smoking.  -Continue good control of his diabetes.   Insulin-dependent diabetes mellitus:  -Poorly controlled. -Continue sliding scale insulin for assistance (4 obvious patient and on steroids treatment)  - Increase to lantus 60 unit BID. -Follow CBGs and continue adjusting insulin therapy as needed.  -A1C 7.8    Procedures:  CXR  Consultations:  none  Discharge Exam: Filed Vitals:   09/02/13 0900  BP: 126/55  Pulse:   Temp: 97.8 F (36.6 C)  Resp:     General: A&O x3 Cardiovascular: RRR Respiratory: good air movement CTA B/L  Discharge Instructions  Discharge Orders   Future Appointments Provider Department Dept Phone   11/11/2013 10:30 AM Alleen Borne, MD [facility] [place] [phone]   12/14/2013 10:00 AM York Spaniel, MD [facility] [phone]   Future Orders Complete By Expires   Diet - low sodium heart healthy  As directed  Increase activity slowly  As directed        Medication List         albuterol (2.5 MG/3ML) 0.083% nebulizer solution  Commonly known as:  PROVENTIL  Take 3 mLs (2.5 mg total) by nebulization every 4 (four) hours as needed. For shortness  of breath     aspirin EC 81 MG tablet  Take 81 mg by mouth daily.     atorvastatin 40 MG tablet  Commonly known as:  LIPITOR  Take 40 mg by mouth daily.     CALCIUM 600/VITAMIN D PO  Take 1 tablet by mouth daily.     diltiazem 120 MG 24 hr capsule  Commonly known as:  CARDIZEM CD  TAKE ONE CAPSULE BY MOUTH EVERY DAY     FLUoxetine 40 MG capsule  Commonly known as:  PROZAC  Take 1 capsule (40 mg total) by mouth daily.     gabapentin 300 MG capsule  Commonly known as:  NEURONTIN  Take 300 mg by mouth 4 (four) times daily.     insulin aspart 100 UNIT/ML injection  Commonly known as:  novoLOG  - Inject 10-25 Units into the skin 3 (three) times daily before meals. CBG < 70: implement hypoglycemia protocol  - CBG 70 - 120: 0 units  - CBG 121 - 150: 5 units  - CBG 151 - 200: 10 units  - CBG 201 - 250: 15 units  - CBG 251 - 300: 20 units  - CBG 301 - 350: 25 units  - CBG 351 - 400: 30 units     insulin glargine 100 UNIT/ML injection  Commonly known as:  LANTUS  Inject 0.8 mLs (80 Units total) into the skin every morning.     losartan 25 MG tablet  Commonly known as:  COZAAR  Take 25 mg by mouth daily.     Magnesium 250 MG Tabs  Take 500 mg by mouth 2 (two) times daily.     moxifloxacin 400 MG tablet  Commonly known as:  AVELOX  Take 1 tablet (400 mg total) by mouth daily.     multivitamin with minerals Tabs tablet  Take 1 tablet by mouth daily.     nitroGLYCERIN 0.4 MG SL tablet  Commonly known as:  NITROSTAT  Place 0.4 mg under the tongue every 5 (five) minutes as needed for chest pain.     olopatadine 0.1 % ophthalmic solution  Commonly known as:  PATANOL  Place 1 drop into both eyes 2 (two) times daily.     predniSONE 10 MG tablet  Commonly known as:  DELTASONE  Takes 6 tablets for 1 days, then 5 tablets for 1 days, then 4 tablets for 1 days, then 3 tablets for 1 days, then 2 tabs for 1 days, then 1 tab for 1 days, and then stop.     ranitidine 150  MG capsule  Commonly known as:  ZANTAC  Take 1 capsule (150 mg total) by mouth 2 (two) times daily.     SYMBICORT 160-4.5 MCG/ACT inhaler  Generic drug:  budesonide-formoterol  Inhale 160 puffs into the lungs daily.     tiotropium 18 MCG inhalation capsule  Commonly known as:  SPIRIVA  Place 1 capsule (18 mcg total) into inhaler and inhale daily.     topiramate 25 MG tablet  Commonly known as:  TOPAMAX  Take 25 mg by mouth 2 (two) times daily.     traZODone 100 MG tablet  Commonly known as:  DESYREL  TAKE ONE TABLET BY MOUTH EVERY NIGHT AT BEDTIME       Allergies  Allergen Reactions  . Actos [Pioglitazone] Other (See Comments)    edema  . Avandia [Rosiglitazone] Other (See Comments)    edema  . Dilaudid [Hydromorphone Hcl]     Made him crazy       Follow-up Information   Follow up with [facility]: Lambert Rhonda W TOM, MD In 2 weeks. (hospital follow up)    Specialty:  Family Medicine   Contact information:   [address] [place] [address] [phone]        The results of significant diagnostics from this hospitalization (including imaging, microbiology, ancillary and laboratory) are listed below for reference.    Significant Diagnostic Studies: Dg Chest 2 View (if Patient Has Fever And/or Copd)  08/30/2013   CLINICAL DATA:  Shortness of breath, cough, COPD  EXAM: CHEST  2 VIEW  COMPARISON:  01/14/2013  FINDINGS: Mild cardiac enlargement stable. No pleural effusion or consolidation. There are increased bilateral perihilar markings. This is also similar when compared to the prior study.  IMPRESSION: Chronic interstitial change. No acute findings.   Electronically Signed   By: Esperanza Heir M.D.   On: 08/30/2013 13:05    Microbiology: Recent Results (from the past 240 hour(s))  CULTURE, BLOOD (ROUTINE X 2)     Status: None   Collection Time    08/30/13  6:00 PM      Result Value Range Status   Specimen Description BLOOD LEFT ARM   Final   Special Requests      Final   Value: BOTTLES DRAWN AEROBIC AND ANAEROBIC 10CC BLUE, 5CC RED   Culture  Setup Time     Final   Value: 08/31/2013 00:28     Performed at [facility]   Culture     Final   Value:        BLOOD CULTURE RECEIVED NO GROWTH TO DATE CULTURE WILL BE HELD FOR 5 DAYS BEFORE ISSUING A FINAL NEGATIVE REPORT     Performed at [facility]   Report Status PENDING   Incomplete  CULTURE, BLOOD (ROUTINE X 2)     Status: None   Collection Time    08/30/13  6:24 PM      Result Value Range Status   Specimen Description BLOOD LEFT HAND   Final   Special Requests BOTTLES DRAWN AEROBIC ONLY 10CC   Final   Culture  Setup Time     Final   Value: 08/31/2013 00:28     Performed at [facility]   Culture     Final   Value:        BLOOD CULTURE RECEIVED NO GROWTH TO DATE CULTURE WILL BE HELD FOR 5 DAYS BEFORE ISSUING A FINAL NEGATIVE REPORT     Performed at [facility]   Report Status PENDING   Incomplete     Labs: Basic Metabolic Panel:  Recent Labs Lab 08/30/13 1129 08/31/13 0600 09/01/13 0521 09/02/13 0538  NA 135 138 129* 134*  K 4.6 4.5 4.5 4.8  CL 99 102 99 103  CO2 21 21 19 19   GLUCOSE 317* 433* 450* 328*  BUN 36* 43* 43* 46*  CREATININE 2.44* 2.12* 1.68* 1.75*  CALCIUM 8.8 9.4 9.2 9.1   Liver Function Tests: No results found for this basename: AST, ALT, ALKPHOS, BILITOT, PROT, ALBUMIN,  in the last 168 hours No results found for this  basename: LIPASE, AMYLASE,  in the last 168 hours No results found for this basename: AMMONIA,  in the last 168 hours CBC:  Recent Labs Lab 08/30/13 1129 08/31/13 0600  WBC 14.9* 18.5*  HGB 9.7* 10.4*  HCT 27.5* 30.6*  MCV 87.9 89.0  PLT 186 229   Cardiac Enzymes: No results found for this basename: CKTOTAL, CKMB, CKMBINDEX, TROPONINI,  in the last 168 hours BNP: BNP (last 3 results)  Recent Labs  12/12/12 1543 12/26/12 1133 08/30/13 1129  PROBNP 787.2* 209.0* 236.3*   CBG:  Recent Labs Lab  09/01/13 1047 09/01/13 1620 09/01/13 2049 09/02/13 0629 09/02/13 1105  GLUCAP 408* 396* 371* 285* 400*       Signed:  David Stall, Sophiarose Eades  [facility] 09/02/2013, 12:15 PM

## 2013-09-02 NOTE — Evaluation (Signed)
Physical Therapy Evaluation Patient Details Name: Keith EWBANK MRN: [id number] DOB: [birth year] Today's Date: 09/02/2013 Time: 1115-1130 PT Time Calculation (min): 15 min  PT Assessment / Plan / Recommendation History of Present Illness  [name] is a 62 y.o. male with multiple comorbidities including type 2 diabetes mellitus, obstructive sleep apnea, coronary artery disease who presents emergency department today with complaints of cough, wheezing, shortness of breath. He states having productive cough with green sputum production, not exertional dyspnea, subjective fevers chills, malaise, fatigue, as well as wheezing, which have become progressively worse over the past week. He also complains of subjective fevers and chills as well as chest pain precipitated by cough and deep inspiration. Patient has not been on recent antibiotic therapy. He does not utilize home oxygen although wears BiPAP at nighttime for obstructive sleep apnea. States using nebulizers at home which helped for a short period of time. Unfortunately he continues to smoke about a pack of cigarettes a day. In the emergency room he was administered Solu-Medrol 125 mg IV x1 along with ceftriaxone and azithromycin.  Clinical Impression  Pt is currently mod I with all mobility due to need for frequent rest breaks.  Able to perform gait on room air with spO2 95%.  Pt does not need further PT services at this time.    PT Assessment  Patent does not need any further PT services    Follow Up Recommendations  No PT follow up    Does the patient have the potential to tolerate intense rehabilitation      Barriers to Discharge        Equipment Recommendations  None recommended by PT    Recommendations for Other Services     Frequency      Precautions / Restrictions Restrictions Weight Bearing Restrictions: No   Pertinent Vitals/Pain No c/o pain.      Mobility  Bed Mobility Bed Mobility: Supine to Sit Supine  to Sit: 7: Independent Transfers Transfers: Editor, commissioning Transfers: 7: Independent Ambulation/Gait Ambulation/Gait Assistance: 6: Modified independent (Device/Increase time) Ambulation Distance (Feet): 200 Feet Assistive device: None Ambulation/Gait Assistance Details: required 3 standing rest breaks due to SOB, spO2 95% on room air throughout session    Exercises     PT Diagnosis:    PT Problem List:   PT Treatment Interventions:       PT Goals(Current goals can be found in the care plan section)    Visit Information  Last PT Received On: 09/02/13 Assistance Needed: +1 History of Present Illness: [name] is a 62 y.o. male with multiple comorbidities including type 2 diabetes mellitus, obstructive sleep apnea, coronary artery disease who presents emergency department today with complaints of cough, wheezing, shortness of breath. He states having productive cough with green sputum production, not exertional dyspnea, subjective fevers chills, malaise, fatigue, as well as wheezing, which have become progressively worse over the past week. He also complains of subjective fevers and chills as well as chest pain precipitated by cough and deep inspiration. Patient has not been on recent antibiotic therapy. He does not utilize home oxygen although wears BiPAP at nighttime for obstructive sleep apnea. States using nebulizers at home which helped for a short period of time. Unfortunately he continues to smoke about a pack of cigarettes a day. In the emergency room he was administered Solu-Medrol 125 mg IV x1 along with ceftriaxone and azithromycin.       Prior Functioning  Home Living Family/patient expects  to be discharged to:: Private residence Living Arrangements: Spouse/significant other Available Help at Discharge: Family;Available PRN/intermittently Type of Home: House Home Access: Stairs to enter [employer] of Steps: 2 Entrance Stairs-Rails:  Right Home Layout: One level Prior Function Level of Independence: Independent Communication Communication: No difficulties    Cognition  Cognition Arousal/Alertness: Awake/alert Behavior During Therapy: WFL for tasks assessed/performed Overall Cognitive Status: Within Functional Limits for tasks assessed    Extremity/Trunk Assessment Lower Extremity Assessment Lower Extremity Assessment: Overall WFL for tasks assessed Cervical / Trunk Assessment Cervical / Trunk Assessment: Normal   Balance Dynamic Standing Balance Dynamic Standing - Balance Support: No upper extremity supported Dynamic Standing - Level of Assistance: 6: Modified independent (Device/Increase time)  End of Session PT - End of Session Equipment Utilized During Treatment: Gait belt Activity Tolerance: Patient tolerated treatment well Patient left: in bed;with call bell/phone within reach Nurse Communication: Mobility status  GP     Delcia Spitzley 09/02/2013, 11:36 AM

## 2013-09-02 NOTE — Progress Notes (Signed)
The patient did not have any acute changes or complaints overnight.  His blood sugar was better controlled at 285 this morning.

## 2013-09-02 NOTE — Progress Notes (Signed)
Patient's IV and telemetry discontinued, CMT notified. Patient verbalizes understanding of discharge instructions. Lorretta Harp RN

## 2013-09-02 NOTE — Progress Notes (Signed)
SATURATION QUALIFICATIONS: (This note is used to comply with regulatory documentation for home oxygen)  Patient Saturations on Room Air at Rest = 94%  Patient Saturations on Room Air while Ambulating = 95%  Patient Saturations on n/a Liters of oxygen while Ambulating = n/a%  Please briefly explain why patient needs home oxygen: Patient does not qualify for oxygen

## 2013-09-06 LAB — CULTURE, BLOOD (ROUTINE X 2)
Culture: NO GROWTH
Culture: NO GROWTH

## 2013-09-16 IMAGING — CR DG CHEST 2V
2 series · 2 of 2 positions shown · non-contrast
Comparison: 11/13/2011

CLINICAL DATA: Preop for bronchoscopy and right upper lobectomy for
lung lesion.

CHEST - 2 VIEW

[view not recorded (1 of 2)]
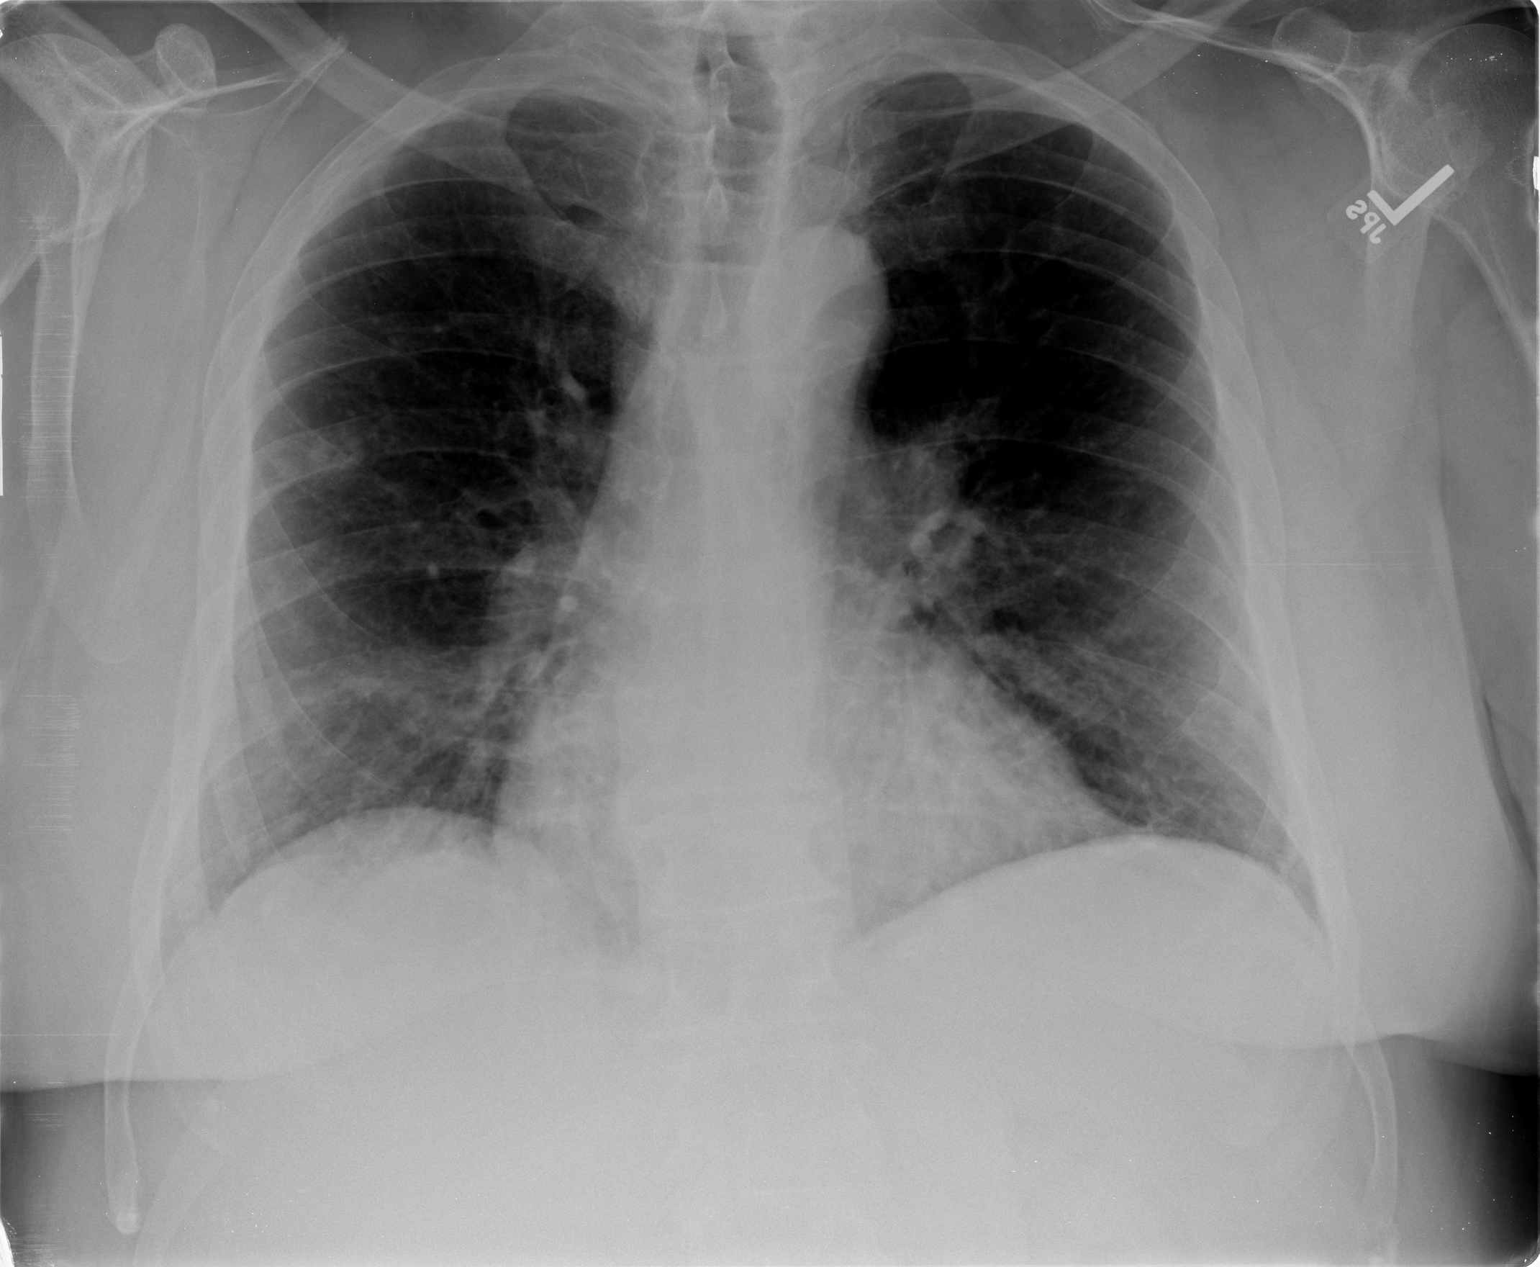

[view not recorded (2 of 2)]
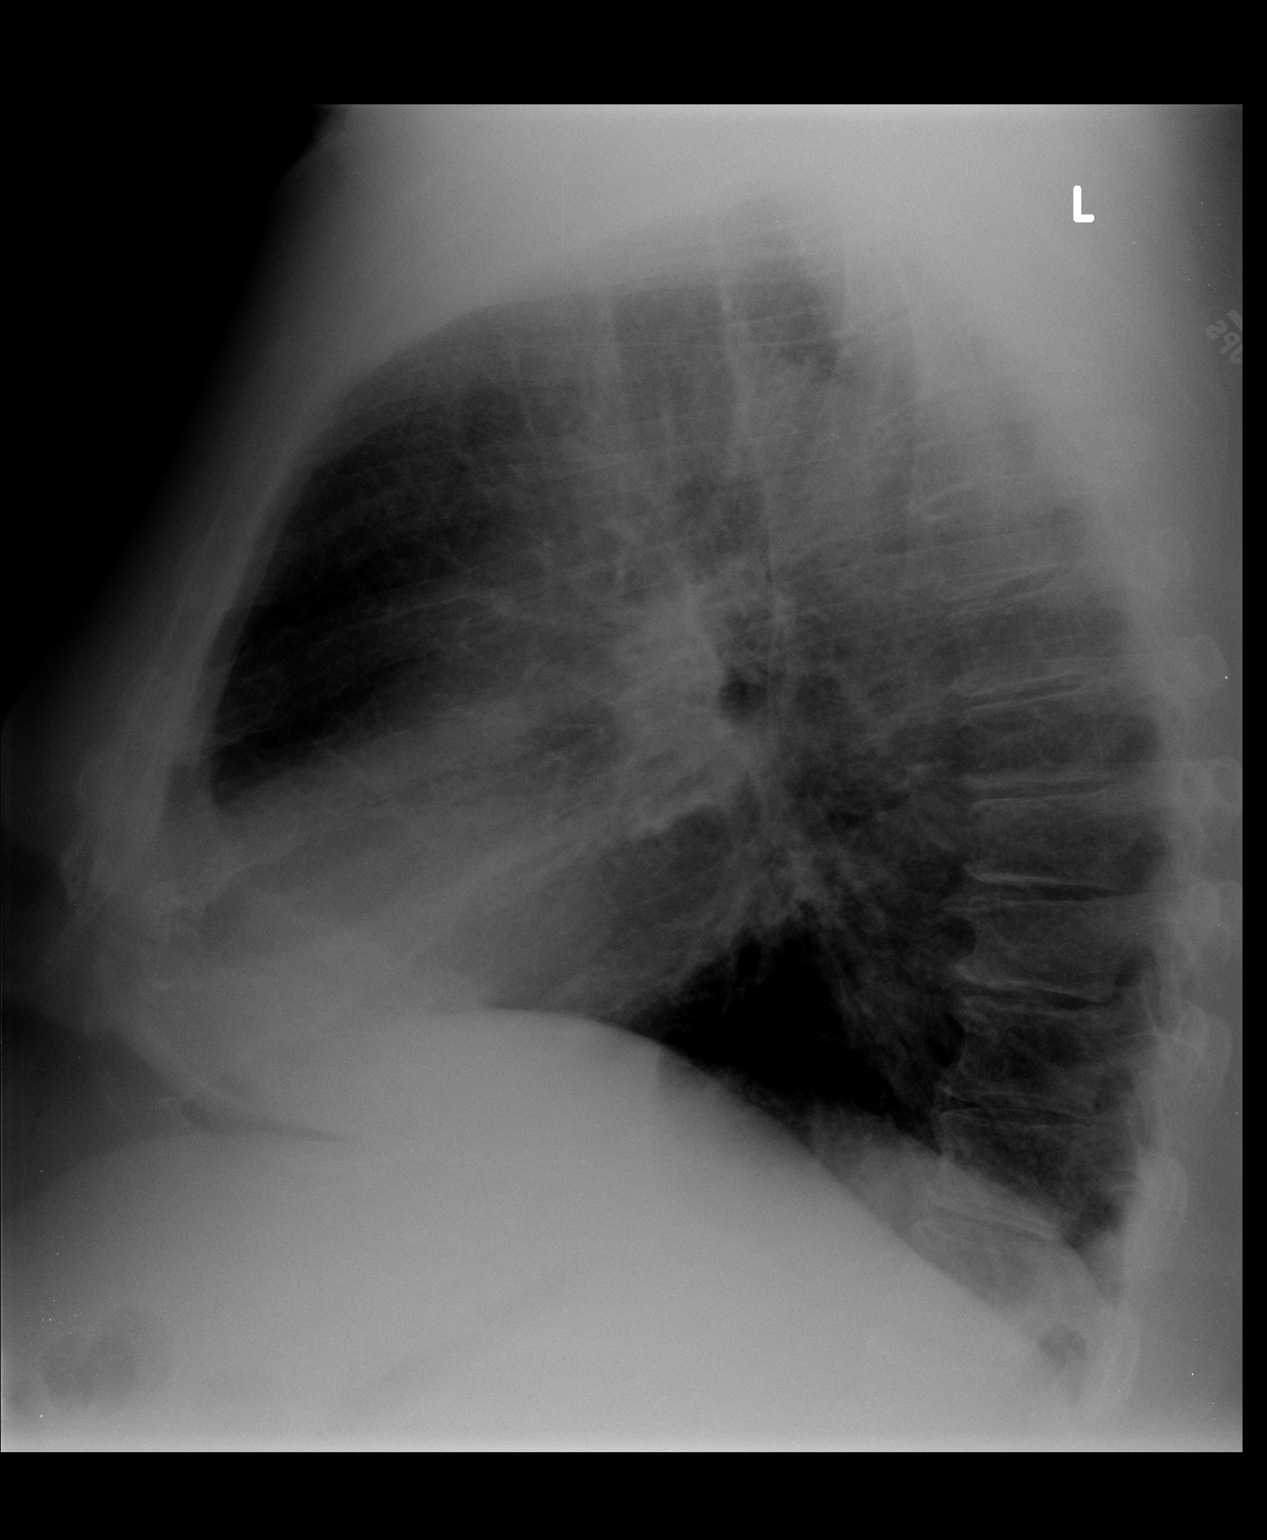

[2 of 2 positions shown; findings below may reference images not displayed]

FINDINGS: 13 mm nodule in the right midlung is again demonstrated.
This is not as well visualized as on the previous study, probably
due to differences in exposure.  Borderline heart size with normal
pulmonary vascularity.  No focal airspace consolidation in the
lungs.  Emphysematous changes in the upper lungs.  No blunting of
costophrenic angles.  Degenerative changes in the thoracic spine.
No significant change since previous study.
IMPRESSION: 13 mm nodule again demonstrated in the right mid lung.
Emphysematous changes in the lungs.  No focal airspace
consolidation.

## 2013-09-22 ENCOUNTER — Encounter: Payer: Self-pay | Admitting: Family Medicine

## 2013-09-22 ENCOUNTER — Ambulatory Visit (INDEPENDENT_AMBULATORY_CARE_PROVIDER_SITE_OTHER): Payer: Managed Care, Other (non HMO) | Admitting: Family Medicine

## 2013-09-22 VITALS — BP 120/60 | HR 86 | Temp 97.9°F | Resp 22 | Ht 71.0 in | Wt 283.0 lb

## 2013-09-22 DIAGNOSIS — Z23 Encounter for immunization: Secondary | ICD-10-CM

## 2013-09-22 DIAGNOSIS — Z09 Encounter for follow-up examination after completed treatment for conditions other than malignant neoplasm: Secondary | ICD-10-CM

## 2013-09-22 DIAGNOSIS — J441 Chronic obstructive pulmonary disease with (acute) exacerbation: Secondary | ICD-10-CM

## 2013-09-22 DIAGNOSIS — R197 Diarrhea, unspecified: Secondary | ICD-10-CM

## 2013-09-22 LAB — CBC WITH DIFFERENTIAL/PLATELET
Basophils Absolute: 0 10*3/uL (ref 0.0–0.1)
Basophils Relative: 0 % (ref 0–1)
Eosinophils Absolute: 0.3 10*3/uL (ref 0.0–0.7)
HCT: 30.7 % — ABNORMAL LOW (ref 39.0–52.0)
Hemoglobin: 10.3 g/dL — ABNORMAL LOW (ref 13.0–17.0)
MCH: 30.3 pg (ref 26.0–34.0)
MCHC: 33.6 g/dL (ref 30.0–36.0)
Monocytes Relative: 5 % (ref 3–12)
Neutro Abs: 10.5 10*3/uL — ABNORMAL HIGH (ref 1.7–7.7)
Neutrophils Relative %: 81 % — ABNORMAL HIGH (ref 43–77)
Platelets: 265 10*3/uL (ref 150–400)
RDW: 14.7 % (ref 11.5–15.5)

## 2013-09-22 LAB — COMPLETE METABOLIC PANEL WITH GFR
ALT: 13 U/L (ref 0–53)
Albumin: 3.9 g/dL (ref 3.5–5.2)
Alkaline Phosphatase: 70 U/L (ref 39–117)
CO2: 22 mEq/L (ref 19–32)
Calcium: 8.8 mg/dL (ref 8.4–10.5)
Chloride: 106 mEq/L (ref 96–112)
GFR, Est African American: 51 mL/min — ABNORMAL LOW
GFR, Est Non African American: 44 mL/min — ABNORMAL LOW
Glucose, Bld: 127 mg/dL — ABNORMAL HIGH (ref 70–99)
Potassium: 4.1 mEq/L (ref 3.5–5.3)
Sodium: 137 mEq/L (ref 135–145)
Total Bilirubin: 0.2 mg/dL — ABNORMAL LOW (ref 0.3–1.2)
Total Protein: 6.1 g/dL (ref 6.0–8.3)

## 2013-09-22 MED ORDER — HYDROCODONE-ACETAMINOPHEN 5-325 MG PO TABS: 1.0000 | ORAL_TABLET | Freq: Four times a day (QID) | ORAL | Status: DC | PRN

## 2013-09-22 NOTE — Progress Notes (Signed)
Subjective:    Patient ID: [name], male    DOB: January 08, 1951, 62 y.o.   MRN: [id number]  HPI Patient was admitted 10/26-10/29 for hypoxia and respiratory distress.  I copied relevant portions of the discharge summary and included them below: Hospital Course:  Acute respiratory failure with hypoxia due to chronic obstructive pulmonary disease exacerbation:  - Most likely secondary to bronchitis/bronchiectasis.  - Will continue dual nebs  - Patient started on Pulmicort and Mucinex  - Changed to prednisone tapered. and oral antibiotics  - OSA/OHS contributing.  Acute on chronic renal failure:  - Patient with a history of stage II chronic kidney disease, appearing to have a baseline creatinine between 1.4 and 1.6.  - Lab work on admission showing a creatinine of 2.44.  -Cr 1.68;  Gastroesophageal reflux disease:  - Continue PPI. Medication has been adjusted to twice a day for better control.  History of Coronary artery disease:  - No abnormalities has been seen on telemetry. Will discontinue telemetry tomorrow morning if no further changes.  -Will continue aspirin and statin  -Patient advised to quit smoking.  -Continue good control of his diabetes.  Insulin-dependent diabetes mellitus:  -Poorly controlled.  -Continue sliding scale insulin for assistance (4 obvious patient and on steroids treatment)  - Increase to lantus 60 unit BID.  -Follow CBGs and continue adjusting insulin therapy as needed.  -A1C 7.8   Patient is here today for follow up.  His breathing is slowly improving. He states he is approximately 2% better. He has not smoked since leaving the hospital. He is using e cigarettes.  He continues to require oxygen with activity. However he is satting 97% on room air at rest. He is currently taking Symbicort and Spiriva. He is using albuterol nebulizers 2-4 times a day.  He is off prednisone. He is due for Prevnar 13. His are having flu shot. Since leaving the hospital he  is having diarrhea on a daily basis as well as crampy lower abdominal pain. His blood sugars are followed by Dr. Sharl Ma.  His fasting blood sugar less than 130. His two-hour postprandial sugars are less than 165. He is currently only taking Lantus 60 units once a day. After he discontinued prednisone he was having hypoglycemia and that he decrease his Lantus to 60 once a day. He continues to use sliding scale insulin. Past Medical History  Diagnosis Date  . Anemia   . Allergy   . Thyroid disease   . Parathyroid disease   . Headache(784.0)   . Hypertension     takes  HYzaar daily  . Coronary artery disease     has 1 stent  . Myocardial infarct   . Asthma   . Emphysema     sees Dr.Ramaswami for this  . COPD (chronic obstructive pulmonary disease)     uses Albuterol and Spiriva daily  . Shortness of breath     with exertion   . Bronchitis   . Productive cough     white in color but no odor  . Sleep apnea     uses BiPaP  . Peripheral neuropathy   . Lung mass     right upper lobe  . Back pain     4 deteriorating disc and receives an injection q3-80mon;has been doing this for about 45yrs  . H/O hiatal hernia   . GERD (gastroesophageal reflux disease)     takes Prilosec daily  . Blood transfusion     as a  child  . Glaucoma     hx of  . Depression     takes Prozac daily  . Insomnia     takes Trazodone nightly  . Anginal pain   . Chronic kidney disease     acute kidney failure post surgery  . Pneumonia     hx of' 62 yo,rd' last time in 2012  . Type II diabetes mellitus     takes Metformin bid and Novolog and Lantus daily  . Arthritis     "hands"  . Syncope and collapse 05/26/2013  . Obesity   . Periodic limb movements of sleep    Current Outpatient Prescriptions on File Prior to Visit  Medication Sig Dispense Refill  . albuterol (PROVENTIL) (2.5 MG/3ML) 0.083% nebulizer solution Take 3 mLs (2.5 mg total) by nebulization every 4 (four) hours as needed. For shortness of breath   75 mL  10  . aspirin EC 81 MG tablet Take 81 mg by mouth daily.      Marland Kitchen atorvastatin (LIPITOR) 40 MG tablet Take 40 mg by mouth daily.      . Calcium Carbonate-Vitamin D (CALCIUM 600/VITAMIN D PO) Take 1 tablet by mouth daily.      Marland Kitchen diltiazem (CARDIZEM CD) 120 MG 24 hr capsule TAKE ONE CAPSULE BY MOUTH EVERY DAY  90 capsule  1  . FLUoxetine (PROZAC) 40 MG capsule Take 1 capsule (40 mg total) by mouth daily.  90 capsule  3  . gabapentin (NEURONTIN) 300 MG capsule Take 300 mg by mouth 4 (four) times daily.      . insulin aspart (NOVOLOG) 100 UNIT/ML injection Inject 10-25 Units into the skin 3 (three) times daily before meals. CBG < 70: implement hypoglycemia protocol CBG 70 - 120: 0 units CBG 121 - 150: 5 units CBG 151 - 200: 10 units CBG 201 - 250: 15 units CBG 251 - 300: 20 units CBG 301 - 350: 25 units CBG 351 - 400: 30 units  1 vial    . insulin glargine (LANTUS) 100 UNIT/ML injection Inject 0.8 mLs (80 Units total) into the skin every morning.  10 mL  12  . losartan (COZAAR) 25 MG tablet Take 25 mg by mouth daily.      . Magnesium 250 MG TABS Take 500 mg by mouth 2 (two) times daily.       . Multiple Vitamin (MULTIVITAMIN WITH MINERALS) TABS Take 1 tablet by mouth daily.      . nitroGLYCERIN (NITROSTAT) 0.4 MG SL tablet Place 0.4 mg under the tongue every 5 (five) minutes as needed for chest pain.      Marland Kitchen olopatadine (PATANOL) 0.1 % ophthalmic solution Place 1 drop into both eyes 2 (two) times daily.  5 mL  12  . ranitidine (ZANTAC) 150 MG capsule Take 1 capsule (150 mg total) by mouth 2 (two) times daily.  60 capsule  11  . SYMBICORT 160-4.5 MCG/ACT inhaler Inhale 160 puffs into the lungs daily.      Marland Kitchen tiotropium (SPIRIVA) 18 MCG inhalation capsule Place 1 capsule (18 mcg total) into inhaler and inhale daily.  90 capsule  3  . topiramate (TOPAMAX) 25 MG tablet Take 25 mg by mouth 2 (two) times daily.      . traZODone (DESYREL) 100 MG tablet TAKE ONE TABLET BY MOUTH EVERY NIGHT AT BEDTIME   90 tablet  1   No current facility-administered medications on file prior to visit.   Allergies  Allergen Reactions  .  Actos [Pioglitazone] Other (See Comments)    edema  . Avandia [Rosiglitazone] Other (See Comments)    edema  . Dilaudid [Hydromorphone Hcl]     Made him crazy   History   Social History  . Marital Status: Married    Spouse Name: N/A    Number of Children: N/A  . Years of Education: N/A   Occupational History  . Not on file.   Social History Main Topics  . Smoking status: Current Every Day Smoker -- 1.00 packs/day for 45 years    Types: Cigarettes    Last Attempt to Quit: 03/26/2012  . Smokeless tobacco: Never Used  . Alcohol Use: Yes     Comment: gallon per week liquor "Haven't drink in 6 weeks"  . Drug Use: Yes    Special: Cocaine     Comment: quit 1990's  . Sexual Activity: Not Currently   Other Topics Concern  . 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       Objective:   Physical Exam  Vitals reviewed. HENT:  Mouth/Throat: Oropharynx is clear and moist.  Neck: Neck supple. No JVD present.  Cardiovascular: Normal rate and regular rhythm.   Pulmonary/Chest: Effort normal. No accessory muscle usage. Not tachypneic. He has decreased breath sounds. He has wheezes. He has no rhonchi. He has no rales.  Abdominal: Soft. Bowel sounds are normal. He exhibits no distension. There is no tenderness. There is no rebound.  Lymphadenopathy:    He has no cervical adenopathy.          Assessment & Plan:  1. Hospital discharge follow-up I will check a CBC and a CMP given his acute renal failure. I will make sure his creatinine is not again rising due to the diarrhea. Congratulated the patient on smoking cessation. I will give  the patient Prevnar 13. He continues to complain of low back pain. He's currently seeing Dr. Ethelene Hal for epidural steroid injections due to degenerative this disease. I agreed to give the patient Norco 5/325 one by mouth every 6 hours #60 tablets per month. Blood sugars appear well controlled. I will defer this to his endocrinologist is no acute changes are necessary. He sees his endocrinologist in one month. - COMPLETE METABOLIC PANEL WITH GFR - CBC with Differential - Clostridium Difficile by PCR  2. COPD exacerbation Continue Symbicort and Spiriva.  Congratulated the patient on smoking cessation. We'll give the patient Prevnar 13.  3. Diarrhea Given his recent hospitalization, anabolic use, and steroid use, I will check the patient for C. difficile colitis. If positive also the patient on Flagyl immediately. If negative will start the patient probiotic. - Clostridium Difficile by PCR

## 2013-09-24 ENCOUNTER — Other Ambulatory Visit: Payer: Managed Care, Other (non HMO)

## 2013-09-27 ENCOUNTER — Encounter: Payer: Self-pay | Admitting: Family Medicine

## 2013-09-28 LAB — CLOSTRIDIUM DIFFICILE BY PCR: Toxigenic C. Difficile by PCR: NOT DETECTED

## 2013-10-02 NOTE — Addendum Note (Signed)
Addended by: Legrand Rams B on: 10/02/2013 03:40 PM   Modules accepted: Orders

## 2013-11-10 ENCOUNTER — Ambulatory Visit: Payer: Medicare Other | Admitting: Surgery

## 2013-11-11 ENCOUNTER — Ambulatory Visit
Admission: RE | Admit: 2013-11-11 | Discharge: 2013-11-11 | Disposition: A | Discharge status: Home / Self Care | Payer: Managed Care, Other (non HMO) | Source: Ambulatory Visit | Attending: Surgery | Admitting: Surgery

## 2013-11-11 ENCOUNTER — Ambulatory Visit (INDEPENDENT_AMBULATORY_CARE_PROVIDER_SITE_OTHER): Payer: Managed Care, Other (non HMO) | Admitting: Surgery

## 2013-11-11 ENCOUNTER — Encounter: Payer: Self-pay | Admitting: Surgery

## 2013-11-11 ENCOUNTER — Other Ambulatory Visit: Payer: Self-pay | Admitting: *Deleted

## 2013-11-11 VITALS — BP 128/69 | HR 100 | Resp 20 | Ht 71.0 in | Wt 283.0 lb

## 2013-11-11 DIAGNOSIS — Z85118 Personal history of other malignant neoplasm of bronchus and lung: Secondary | ICD-10-CM

## 2013-11-11 NOTE — Progress Notes (Signed)
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 He has been feeling well overall. Since I last saw him in July 2014 he was admitted in late October for respiratory failure and hypoxia secondary to COPD flare from bronchitis/bronchiectasis. He continues to abstain from smoking but does use an Engineer, manufacturing. He has a chronic cough but no sputum or hemoptysis. He denies headaches or visual changes. His appetite has been good and weight has been stable.   Current Outpatient Prescriptions  Medication Sig Dispense Refill  . albuterol (PROVENTIL) (2.5 MG/3ML) 0.083% nebulizer solution Take 3 mLs (2.5 mg total) by nebulization every 4 (four) hours as needed. For shortness of breath  75 mL  10  . aspirin EC 81 MG tablet Take 81 mg by mouth daily.      Marland Kitchen atorvastatin (LIPITOR) 40 MG tablet Take 40 mg by mouth daily.      . Calcium Carbonate-Vitamin D (CALCIUM 600/VITAMIN D PO) Take 1 tablet by mouth daily.      Marland Kitchen diltiazem (CARDIZEM CD) 120 MG 24 hr capsule TAKE ONE CAPSULE BY MOUTH EVERY DAY  90 capsule  1  . FLUoxetine (PROZAC) 40 MG capsule Take 1 capsule (40 mg total) by mouth daily.  90 capsule  3  . gabapentin (NEURONTIN) 300 MG capsule Take 300 mg by mouth 4 (four) times daily.      Marland Kitchen HYDROcodone-acetaminophen (NORCO) 5-325 MG per tablet Take 1 tablet by mouth every 6 (six) hours as needed for moderate pain.  60 tablet  0  . insulin aspart (NOVOLOG) 100 UNIT/ML injection Inject 10-25 Units into the skin 3 (three) times daily before meals. CBG < 70: implement hypoglycemia protocol CBG 70 - 120: 0 units CBG 121 - 150: 5 units CBG 151 - 200: 10 units CBG 201 - 250: 15 units CBG 251 - 300: 20 units CBG 301 - 350: 25 units CBG 351 - 400: 30 units  1 vial    . insulin glargine (LANTUS) 100 UNIT/ML injection Inject 0.8 mLs (80 Units total)  into the skin every morning.  10 mL  12  . losartan (COZAAR) 25 MG tablet Take 25 mg by mouth daily.      . Magnesium 250 MG TABS Take 500 mg by mouth 2 (two) times daily.       . Multiple Vitamin (MULTIVITAMIN WITH MINERALS) TABS Take 1 tablet by mouth daily.      . nitroGLYCERIN (NITROSTAT) 0.4 MG SL tablet Place 0.4 mg under the tongue every 5 (five) minutes as needed for chest pain.      Marland Kitchen olopatadine (PATANOL) 0.1 % ophthalmic solution Place 1 drop into both eyes 2 (two) times daily.  5 mL  12  . ranitidine (ZANTAC) 150 MG capsule Take 1 capsule (150 mg total) by mouth 2 (two) times daily.  60 capsule  11  . SYMBICORT 160-4.5 MCG/ACT inhaler Inhale 160 puffs into the lungs daily.      Marland Kitchen tiotropium (SPIRIVA) 18 MCG inhalation capsule Place 1 capsule (18 mcg total) into inhaler and inhale daily.  90 capsule  3  . topiramate (TOPAMAX) 25 MG tablet Take 25 mg by mouth 2 (two) times daily.      . traZODone (DESYREL) 100 MG tablet TAKE ONE TABLET BY MOUTH EVERY NIGHT AT BEDTIME  90 tablet  1  No current facility-administered medications for this visit.     Physical Exam:  He looks well.  There is no cervical or supraclavicular adenopathy.  Lung exam is clear.  The right thoracotomy scar looks good. There are no skin lesions.  Cardiac exam shows a regular rate and rhythm with normal heart sounds.    Diagnostic Tests:  CLINICAL DATA: Chest pain. Former smoker.  EXAM:  CHEST 2 VIEW  COMPARISON: CT chest 05/06/2013 and PA and lateral chest  08/30/2013.  FINDINGS:  There is volume loss in the right chest and surgical clips in the  right hilum. The lungs are clear. No pneumothorax or pleural  effusion. Heart size is normal.  IMPRESSION:  Postoperative change right chest. No acute abnormality.  Electronically Signed  By: Inge Rise M.D.  On: 11/11/2013 09:39   Impression:  He is doing well 1 1/2 years following lung cancer resection. There is no evidence of recurrence on  CXR.   Plan:  I'll plan to see him back in 6 months with a chest CT.

## 2013-12-04 ENCOUNTER — Other Ambulatory Visit: Payer: Self-pay | Admitting: Family Medicine

## 2013-12-14 ENCOUNTER — Encounter: Payer: Self-pay | Admitting: Neurology

## 2013-12-14 ENCOUNTER — Ambulatory Visit (INDEPENDENT_AMBULATORY_CARE_PROVIDER_SITE_OTHER): Payer: Managed Care, Other (non HMO) | Admitting: Neurology

## 2013-12-14 ENCOUNTER — Encounter (INDEPENDENT_AMBULATORY_CARE_PROVIDER_SITE_OTHER): Payer: Self-pay

## 2013-12-14 VITALS — BP 122/68 | HR 102 | Wt 284.0 lb

## 2013-12-14 DIAGNOSIS — R55 Syncope and collapse: Secondary | ICD-10-CM

## 2013-12-14 MED ORDER — TOPIRAMATE 50 MG PO TABS: 50.0000 mg | ORAL_TABLET | Freq: Two times a day (BID) | ORAL | Status: DC

## 2013-12-14 NOTE — Progress Notes (Signed)
Reason for visit: Syncope  [name] is an 63 y.o. male  History of present illness:  [name] is a 63 year old right-handed white male with a history of obesity, diabetes, hypertension, and syncope. The patient has a history of lung cancer as well, and he continues to smoke. The patient had syncopal events that always occur while standing, and usually are associated with some visual alteration prior to the event. The episodes tend to occur after he has been sitting for greater than 40 minutes. The patient has never had an event while sitting or lying down. The patient will have a blackout without warning at times. The patient may have some jerking prior to the onset of syncope, and the patient occasionally will have urinary incontinence. The patient usually wakes up with urgency of the bladder, and he has to get to the bathroom quickly. The patient indicates that when he was placed on Topamax at 25 mg twice daily, he went from having almost daily events to one event every month or 2. The patient has undergone a prolonged heart monitor that was reportedly unremarkable, and carotid Doppler studies were unremarkable. The patient had MRI brain evaluation that showed mild small vessel disease. An EEG study was normal. The patient returns for an evaluation.  Past Medical History  Diagnosis Date  . Anemia   . Allergy   . Thyroid disease   . Parathyroid disease   . Headache(784.0)   . Hypertension     takes  HYzaar daily  . Coronary artery disease     has 1 stent  . Myocardial infarct   . Asthma   . Emphysema     sees Dr.Ramaswami for this  . COPD (chronic obstructive pulmonary disease)     uses Albuterol and Spiriva daily  . Shortness of breath     with exertion   . Bronchitis   . Productive cough     white in color but no odor  . Sleep apnea     uses BiPaP  . Peripheral neuropathy   . Lung mass     right upper lobe  . Back pain     4 deteriorating disc and receives an  injection q3-70mon;has been doing this for about 22yrs  . H/O hiatal hernia   . GERD (gastroesophageal reflux disease)     takes Prilosec daily  . Blood transfusion     as a child  . Glaucoma     hx of  . Depression     takes Prozac daily  . Insomnia     takes Trazodone nightly  . Anginal pain   . Chronic kidney disease     acute kidney failure post surgery  . Pneumonia     hx of' 63 yo,rd' last time in 2012  . Type II diabetes mellitus     takes Metformin bid and Novolog and Lantus daily  . Arthritis     "hands"  . Syncope and collapse 05/26/2013  . Obesity   . Periodic limb movements of sleep   . Cancer     Non-small cell lung cancer    Past Surgical History  Procedure Laterality Date  . Carpal tunnel release      bilateral  . Rotator cuff repair      left  . Colonoscopy    . Esophagogastroduodenoscopy    . Eye surgery    . Lobectomy  03/17/2012    upper right side with resection  . Elbow  surgery      ulnar nerve; left  . Cardiac catheterization  2006/2008/2009  . Coronary angioplasty with stent placement      1 stent  . Refractive surgery      bilaterally  . Inguinal hernia repair  48's    double inguinal    Family History  Problem Relation Age of Onset  . COPD Mother   . Diabetes Mother   . Hypertension Mother   . Heart attack Father   . Diabetes Father   . Anesthesia problems Neg Hx   . Hypotension Neg Hx   . Malignant hyperthermia Neg Hx   . Pseudochol deficiency Neg Hx     Social history:  reports that he has been smoking Cigarettes.  He has a 45 pack-year smoking history. He has never used smokeless tobacco. He reports that he drinks alcohol. He reports that he uses illicit drugs (Cocaine).    Allergies  Allergen Reactions  . Actos [Pioglitazone] Other (See Comments)    edema  . Avandia [Rosiglitazone] Other (See Comments)    edema  . Dilaudid [Hydromorphone Hcl]     Made him crazy    Medications:  Current Outpatient Prescriptions on  File Prior to Visit  Medication Sig Dispense Refill  . albuterol (PROVENTIL) (2.5 MG/3ML) 0.083% nebulizer solution Take 3 mLs (2.5 mg total) by nebulization every 4 (four) hours as needed. For shortness of breath  75 mL  10  . aspirin EC 81 MG tablet Take 81 mg by mouth daily.      Marland Kitchen atorvastatin (LIPITOR) 40 MG tablet Take 40 mg by mouth daily.      . Calcium Carbonate-Vitamin D (CALCIUM 600/VITAMIN D PO) Take 1 tablet by mouth daily.      Marland Kitchen diltiazem (CARDIZEM CD) 120 MG 24 hr capsule TAKE ONE CAPSULE BY MOUTH EVERY DAY  90 capsule  1  . FLUoxetine (PROZAC) 40 MG capsule Take 1 capsule (40 mg total) by mouth daily.  90 capsule  3  . gabapentin (NEURONTIN) 300 MG capsule Take 300 mg by mouth 4 (four) times daily.      Marland Kitchen HYDROcodone-acetaminophen (NORCO) 5-325 MG per tablet Take 1 tablet by mouth every 6 (six) hours as needed for moderate pain.  60 tablet  0  . insulin aspart (NOVOLOG) 100 UNIT/ML injection Inject 10-25 Units into the skin 3 (three) times daily before meals. CBG < 70: implement hypoglycemia protocol CBG 70 - 120: 0 units CBG 121 - 150: 5 units CBG 151 - 200: 10 units CBG 201 - 250: 15 units CBG 251 - 300: 20 units CBG 301 - 350: 25 units CBG 351 - 400: 30 units  1 vial    . insulin glargine (LANTUS) 100 UNIT/ML injection Inject 0.8 mLs (80 Units total) into the skin every morning.  10 mL  12  . losartan (COZAAR) 25 MG tablet Take 25 mg by mouth daily.      . Magnesium 250 MG TABS Take 500 mg by mouth 2 (two) times daily.       . Multiple Vitamin (MULTIVITAMIN WITH MINERALS) TABS Take 1 tablet by mouth daily.      . nitroGLYCERIN (NITROSTAT) 0.4 MG SL tablet Place 0.4 mg under the tongue every 5 (five) minutes as needed for chest pain.      Marland Kitchen olopatadine (PATANOL) 0.1 % ophthalmic solution Place 1 drop into both eyes 2 (two) times daily.  5 mL  12  . ranitidine (ZANTAC) 150 MG capsule  Take 1 capsule (150 mg total) by mouth 2 (two) times daily.  60 capsule  11  . SYMBICORT  160-4.5 MCG/ACT inhaler Inhale 160 puffs into the lungs daily.      Marland Kitchen tiotropium (SPIRIVA) 18 MCG inhalation capsule Place 1 capsule (18 mcg total) into inhaler and inhale daily.  90 capsule  3  . traZODone (DESYREL) 100 MG tablet TAKE ONE TABLET BY MOUTH EVERY NIGHT AT BEDTIME  90 tablet  1   No current facility-administered medications on file prior to visit.    ROS:  Out of a complete 14 system review of symptoms, the patient complains only of the following symptoms, and all other reviewed systems are negative.  Chills, fatigue Eye discharge Cough, wheezing Sleep apnea Frequency of urination Joint pain, back pain, muscle cramps Numbness, blackout episodes, weakness  Blood pressure 122/68, pulse 102, weight 284 lb (128.822 kg).  Blood pressure, right arm, standing is 140/68. Blood pressure, sitting, right arm is 138/74.  Physical Exam  General: The patient is alert and cooperative at the time of the examination. The patient is markedly obese.  Skin: 1+ edema at the ankles is noted bilaterally.   Neurologic Exam  Mental status: The patient is oriented x 3.  Cranial nerves: Facial symmetry is present. Speech is normal, no aphasia or dysarthria is noted. Extraocular movements are full. Visual fields are full.  Motor: The patient has good strength in all 4 extremities.  Sensory examination: Soft touch sensation on the face, arms, and legs is symmetric.  Coordination: The patient has good finger-nose-finger and heel-to-shin bilaterally.  Gait and station: The patient has a normal gait. Tandem gait is normal. Romberg is negative. No drift is seen.  Reflexes: Deep tendon reflexes are symmetric.   Assessment/Plan:  1. Syncope  2. Diabetes  3. Obesity  4. Sleep apnea  5. History of lung cancer  The patient has apparently gained significant benefit with the Topamax. The patient will go up on the dose taking 50 mg twice daily. The patient will followup through this  office in 4-5 months. The patient does operate a motor vehicle. He has never had a blackout episode while sitting.  Jill Alexanders MD 12/14/2013 7:31 PM  [facility] [address] [place], [place] [address]  Phone [phone] Fax [phone]

## 2013-12-14 NOTE — Patient Instructions (Addendum)
With the Topamax, begin taking one tablet in the morning and two in the evening until the bottle is gone. Then we will start with the 50 mg tablets, take one tablet twice a day.Syncope Syncope is a fainting spell. This means the person loses consciousness and drops to the ground. The person is generally unconscious for less than 5 minutes. The person may have some muscle twitches for up to 15 seconds before waking up and returning to normal. Syncope occurs more often in elderly people, but it can happen to anyone. While most causes of syncope are not dangerous, syncope can be a sign of a serious medical problem. It is important to seek medical care.  CAUSES  Syncope is caused by a sudden decrease in blood flow to the brain. The specific cause is often not determined. Factors that can trigger syncope include:  Taking medicines that lower blood pressure.  Sudden changes in posture, such as standing up suddenly.  Taking more medicine than prescribed.  Standing in one place for too long.  Seizure disorders.  Dehydration and excessive exposure to heat.  Low blood sugar (hypoglycemia).  Straining to have a bowel movement.  Heart disease, irregular heartbeat, or other circulatory problems.  Fear, emotional distress, seeing blood, or severe pain. SYMPTOMS  Right before fainting, you may:  Feel dizzy or lightheaded.  Feel nauseous.  See all white or all black in your field of vision.  Have cold, clammy skin. DIAGNOSIS  Your caregiver will ask about your symptoms, perform a physical exam, and perform electrocardiography (ECG) to record the electrical activity of your heart. Your caregiver may also perform other heart or blood tests to determine the cause of your syncope. TREATMENT  In most cases, no treatment is needed. Depending on the cause of your syncope, your caregiver may recommend changing or stopping some of your medicines. HOME CARE INSTRUCTIONS  Have someone stay with you  until you feel stable.  Do not drive, operate machinery, or play sports until your caregiver says it is okay.  Keep all follow-up appointments as directed by your caregiver.  Lie down right away if you start feeling like you might faint. Breathe deeply and steadily. Wait until all the symptoms have passed.  Drink enough fluids to keep your urine clear or pale yellow.  If you are taking blood pressure or heart medicine, get up slowly, taking several minutes to sit and then stand. This can reduce dizziness. SEEK IMMEDIATE MEDICAL CARE IF:   You have a severe headache.  You have unusual pain in the chest, abdomen, or back.  You are bleeding from the mouth or rectum, or you have black or tarry stool.  You have an irregular or very fast heartbeat.  You have pain with breathing.  You have repeated fainting or seizure-like jerking during an episode.  You faint when sitting or lying down.  You have confusion.  You have difficulty walking.  You have severe weakness.  You have vision problems. If you fainted, call your local emergency services (911 in [place] Do not drive yourself to the hospital.  MAKE SURE YOU:  Understand these instructions.  Will watch your condition.  Will get help right away if you are not doing well or get worse. Document Released: 10/22/2005 Document Revised: 04/22/2012 Document Reviewed: 12/21/2011 [facility] Patient Information 2014 [employer] [place].

## 2014-01-16 ENCOUNTER — Other Ambulatory Visit: Payer: Self-pay | Admitting: Family Medicine

## 2014-01-18 NOTE — Telephone Encounter (Signed)
?   OK to Refill  

## 2014-01-19 NOTE — Telephone Encounter (Signed)
[place] with 6 refills

## 2014-01-19 NOTE — Telephone Encounter (Signed)
Medication refilled per protocol. 

## 2014-01-22 ENCOUNTER — Encounter: Payer: Self-pay | Admitting: Family Medicine

## 2014-01-22 ENCOUNTER — Ambulatory Visit (INDEPENDENT_AMBULATORY_CARE_PROVIDER_SITE_OTHER): Payer: Managed Care, Other (non HMO) | Admitting: Family Medicine

## 2014-01-22 VITALS — BP 130/64 | HR 98 | Temp 97.6°F | Resp 20 | Ht 71.0 in | Wt 281.0 lb

## 2014-01-22 DIAGNOSIS — J441 Chronic obstructive pulmonary disease with (acute) exacerbation: Secondary | ICD-10-CM

## 2014-01-22 DIAGNOSIS — R252 Cramp and spasm: Secondary | ICD-10-CM

## 2014-01-22 LAB — COMPLETE METABOLIC PANEL WITH GFR
ALBUMIN: 4 g/dL (ref 3.5–5.2)
ALT: 15 U/L (ref 0–53)
AST: 10 U/L (ref 0–37)
Alkaline Phosphatase: 77 U/L (ref 39–117)
BUN: 25 mg/dL — AB (ref 6–23)
CALCIUM: 8.8 mg/dL (ref 8.4–10.5)
CHLORIDE: 106 meq/L (ref 96–112)
CO2: 20 mEq/L (ref 19–32)
Creat: 1.59 mg/dL — ABNORMAL HIGH (ref 0.50–1.35)
GFR, EST NON AFRICAN AMERICAN: 46 mL/min — AB
GFR, Est African American: 53 mL/min — ABNORMAL LOW
GLUCOSE: 245 mg/dL — AB (ref 70–99)
POTASSIUM: 4.5 meq/L (ref 3.5–5.3)
Sodium: 137 mEq/L (ref 135–145)
Total Bilirubin: 0.3 mg/dL (ref 0.2–1.2)
Total Protein: 6.4 g/dL (ref 6.0–8.3)

## 2014-01-22 LAB — MAGNESIUM: Magnesium: 1.8 mg/dL (ref 1.5–2.5)

## 2014-01-22 MED ORDER — AZITHROMYCIN 250 MG PO TABS: ORAL_TABLET | ORAL | Status: DC

## 2014-01-22 NOTE — Progress Notes (Signed)
Subjective:    Patient ID: [name], male    DOB: April 09, 1951, 63 y.o.   MRN: [id number]  HPI Patient is a very pleasant 63 year old white male who presents today complaining of cramps in both legs at night. He denies any claudication. He denies any neuropathy. The patient states that whenever he stretches his legs at night they will cramping his calves and feet. He has a past medical history of hypomagnesemia due to chronic PPI use, electrolyte disturbances due to acute tubular necrosis of the kidney and chronic kidney disease, and insulin-dependent diabetes mellitus. He also reports that he's been increasingly short of breath for the last few weeks. He's been requiring rescue nebulizers 3-4 times a day. His cough is productive of white sputum. He denies any fevers or chills. Today on examination he is not wheezing at all. He states that his breathing is gradually improving since his increase the frequency of his rescue medication. He denies any chest pain or hemoptysis. He also has recently been started on Topamax for seizures. Past Medical History  Diagnosis Date  . Anemia   . Allergy   . Thyroid disease   . Parathyroid disease   . Headache(784.0)   . Hypertension     takes  HYzaar daily  . Coronary artery disease     has 1 stent  . Myocardial infarct   . Asthma   . Emphysema     sees Dr.Ramaswami for this  . COPD (chronic obstructive pulmonary disease)     uses Albuterol and Spiriva daily  . Shortness of breath     with exertion   . Bronchitis   . Productive cough     white in color but no odor  . Sleep apnea     uses BiPaP  . Peripheral neuropathy   . Lung mass     right upper lobe  . Back pain     4 deteriorating disc and receives an injection q3-39mon;has been doing this for about 31yrs  . H/O hiatal hernia   . GERD (gastroesophageal reflux disease)     takes Prilosec daily  . Blood transfusion     as a child  . Glaucoma     hx of  . Depression     takes  Prozac daily  . Insomnia     takes Trazodone nightly  . Anginal pain   . Chronic kidney disease     acute kidney failure post surgery  . Pneumonia     hx of' 63 yo,rd' last time in 2012  . Type II diabetes mellitus     takes Metformin bid and Novolog and Lantus daily  . Arthritis     "hands"  . Syncope and collapse 05/26/2013  . Obesity   . Periodic limb movements of sleep   . Cancer     Non-small cell lung cancer   Current Outpatient Prescriptions on File Prior to Visit  Medication Sig Dispense Refill  . albuterol (PROVENTIL) (2.5 MG/3ML) 0.083% nebulizer solution Take 3 mLs (2.5 mg total) by nebulization every 4 (four) hours as needed. For shortness of breath  75 mL  10  . aspirin EC 81 MG tablet Take 81 mg by mouth daily.      Marland Kitchen atorvastatin (LIPITOR) 40 MG tablet Take 40 mg by mouth daily.      . Calcium Carbonate-Vitamin D (CALCIUM 600/VITAMIN D PO) Take 1 tablet by mouth daily.      Marland Kitchen diltiazem (CARDIZEM CD) 120  MG 24 hr capsule TAKE ONE CAPSULE BY MOUTH EVERY DAY  90 capsule  1  . FLUoxetine (PROZAC) 40 MG capsule Take 1 capsule (40 mg total) by mouth daily.  90 capsule  3  . gabapentin (NEURONTIN) 300 MG capsule Take 300 mg by mouth 4 (four) times daily.      Marland Kitchen HYDROcodone-acetaminophen (NORCO) 5-325 MG per tablet Take 1 tablet by mouth every 6 (six) hours as needed for moderate pain.  60 tablet  0  . insulin aspart (NOVOLOG) 100 UNIT/ML injection Inject 10-25 Units into the skin 3 (three) times daily before meals. CBG < 70: implement hypoglycemia protocol CBG 70 - 120: 0 units CBG 121 - 150: 5 units CBG 151 - 200: 10 units CBG 201 - 250: 15 units CBG 251 - 300: 20 units CBG 301 - 350: 25 units CBG 351 - 400: 30 units  1 vial    . insulin glargine (LANTUS) 100 UNIT/ML injection Inject 0.8 mLs (80 Units total) into the skin every morning.  10 mL  12  . losartan (COZAAR) 25 MG tablet Take 25 mg by mouth daily.      . Magnesium 250 MG TABS Take 500 mg by mouth 2 (two) times  daily.       . Multiple Vitamin (MULTIVITAMIN WITH MINERALS) TABS Take 1 tablet by mouth daily.      . nitroGLYCERIN (NITROSTAT) 0.4 MG SL tablet Place 0.4 mg under the tongue every 5 (five) minutes as needed for chest pain.      Marland Kitchen olopatadine (PATANOL) 0.1 % ophthalmic solution Place 1 drop into both eyes 2 (two) times daily.  5 mL  12  . ranitidine (ZANTAC) 150 MG capsule Take 1 capsule (150 mg total) by mouth 2 (two) times daily.  60 capsule  11  . SYMBICORT 160-4.5 MCG/ACT inhaler Inhale 160 puffs into the lungs daily.      Marland Kitchen tiotropium (SPIRIVA) 18 MCG inhalation capsule Place 1 capsule (18 mcg total) into inhaler and inhale daily.  90 capsule  3  . topiramate (TOPAMAX) 50 MG tablet Take 1 tablet (50 mg total) by mouth 2 (two) times daily.  60 tablet  5  . traZODone (DESYREL) 100 MG tablet TAKE ONE TABLET BY MOUTH EVERY NIGHT AT BEDTIME  90 tablet  1   No current facility-administered medications on file prior to visit.   Allergies  Allergen Reactions  . Actos [Pioglitazone] Other (See Comments)    edema  . Avandia [Rosiglitazone] Other (See Comments)    edema  . Dilaudid [Hydromorphone Hcl]     Made him crazy   History   Social History  . Marital Status: Married    Spouse Name: N/A    Number of Children: 3  . Years of Education: trade    Occupational History  . truck driver     retired   Social History Main Topics  . Smoking status: Current Every Day Smoker -- 1.00 packs/day for 45 years    Types: Cigarettes    Last Attempt to Quit: 03/26/2012  . Smokeless tobacco: Never Used  . Alcohol Use: Yes     Comment: gallon per week liquor "Haven't drink in 6 weeks"  . Drug Use: Yes    Special: Cocaine     Comment: quit 1990's  . Sexual Activity: Not Currently   Other Topics Concern  . 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       Objective:   Physical Exam  Vitals reviewed. Neck: Neck supple. No thyromegaly present.  Cardiovascular: Normal rate, regular rhythm, normal heart sounds and intact distal pulses.  Exam reveals no gallop and no friction rub.   No murmur heard. Pulmonary/Chest: Effort normal. No respiratory distress. He has decreased breath sounds. He has no wheezes. He has no rales.  Musculoskeletal: He exhibits edema.  Lymphadenopathy:    He has no cervical adenopathy.   patient has trace bipedal edema        Assessment & Plan:  1. COPD exacerbation I believe the patient has a COPD exacerbation. His breathing does not seem to require prednisone at the present time particular given his history of insulin-dependent diabetes. I will begin the patient on a Z-Pak. If his symptoms worsen I will start him on oral corticosteroids. - azithromycin (ZITHROMAX) 250 MG tablet; 2 tabs poqday1, 1 tab poqday 2-5  Dispense: 6 tablet; Refill: 0  2. Cramps of left lower extremity Patient has a history of electrolyte disturbances, ATN, chronic kidney disease, low magnesium, low calcium. Patient started blood work to rule out electrolyte abnormalities as a cause of his cramps. His labs are within normal limits I will start the patient on chloroquine for nocturnal cramps. - COMPLETE METABOLIC PANEL WITH GFR - Magnesium

## 2014-01-26 ENCOUNTER — Other Ambulatory Visit: Payer: Self-pay | Admitting: Family Medicine

## 2014-01-26 ENCOUNTER — Telehealth: Payer: Self-pay | Admitting: Family Medicine

## 2014-01-26 MED ORDER — CHLOROQUINE PHOSPHATE 250 MG PO TABS: ORAL_TABLET | ORAL | Status: DC

## 2014-01-26 NOTE — Telephone Encounter (Signed)
Message copied by Olena Mater on Tue Jan 26, 2014 10:22 AM ------      Message from: Jenna Luo      Created: Tue Jan 26, 2014  7:33 AM       Labs showed no electrolyte disturbances which would explain his cramps. If he is interested I would begin chloroquine 250 mg by mouth daily for 2 weeks, then once a week thereafter to try  to stop his cramps ------

## 2014-02-01 NOTE — Telephone Encounter (Signed)
Pt had already spoke to one of the other nurse.  Has been on new med x 1 week

## 2014-02-02 ENCOUNTER — Other Ambulatory Visit: Payer: Self-pay

## 2014-02-02 MED ORDER — TOPIRAMATE 50 MG PO TABS: 50.0000 mg | ORAL_TABLET | Freq: Two times a day (BID) | ORAL | Status: DC

## 2014-02-02 NOTE — Telephone Encounter (Signed)
Pharmacy requests 90 days

## 2014-02-17 ENCOUNTER — Other Ambulatory Visit: Payer: Self-pay | Admitting: Family Medicine

## 2014-02-17 NOTE — Telephone Encounter (Signed)
?   Ok to refill, last refil l03/21/14, lov 01/22/14

## 2014-02-17 NOTE — Telephone Encounter (Signed)
Medication refilled per protocol. 

## 2014-03-23 ENCOUNTER — Encounter: Payer: Self-pay | Admitting: Family Medicine

## 2014-03-23 ENCOUNTER — Ambulatory Visit (INDEPENDENT_AMBULATORY_CARE_PROVIDER_SITE_OTHER): Payer: Managed Care, Other (non HMO) | Admitting: Family Medicine

## 2014-03-23 VITALS — BP 142/78 | HR 90 | Temp 97.7°F | Resp 20 | Ht 69.0 in | Wt 274.0 lb

## 2014-03-23 DIAGNOSIS — J441 Chronic obstructive pulmonary disease with (acute) exacerbation: Secondary | ICD-10-CM

## 2014-03-23 MED ORDER — MOXIFLOXACIN HCL 400 MG PO TABS: 400.0000 mg | ORAL_TABLET | Freq: Every day | ORAL | Status: DC

## 2014-03-23 MED ORDER — METHYLPREDNISOLONE ACETATE 40 MG/ML IJ SUSP
40.0000 mg | Freq: Once | INTRAMUSCULAR | Status: AC
  Administered 2014-03-23: 40 mg via INTRAMUSCULAR

## 2014-03-23 MED ORDER — PREDNISONE 10 MG PO TABS: ORAL_TABLET | ORAL | Status: DC

## 2014-03-23 MED ORDER — HYDROCODONE-ACETAMINOPHEN 5-325 MG PO TABS: 1.0000 | ORAL_TABLET | Freq: Four times a day (QID) | ORAL | Status: DC | PRN

## 2014-03-23 NOTE — Assessment & Plan Note (Signed)
Pt appears to decompensate quickly, given neb in office, Depo Medrol Start prednisone taper, Levaquin  F/U Friday for recheck on Breathing before weekend, continue oxygen therapy

## 2014-03-23 NOTE — Progress Notes (Signed)
Patient ID: [name], male   DOB: Feb 21, 1951, 63 y.o.   MRN: [id number]   Subjective:    Patient ID: [name], male    DOB: 12-Dec-1950, 63 y.o.   MRN: [id number]  Patient presents for SOB and Congestion   patient has a history of severe COPD with oxygen therapy. He's had history of multiple hospitalizations secondary to COPD as well as bronchopneumonia. He continues to smoke but is using the paper cigarette. For the past 2 months he's had increasing shortness of breath with productive cough and sputum. He's been turned his oxygen up to 3 L most days. He has been using his nebulizer twice a day as well as his bereavement Symbicort. He's not had any fever or chest pain.    Review Of Systems:  GEN- denies fatigue, fever, weight loss,weakness, recent illness HEENT- denies eye drainage, change in vision, nasal discharge, CVS- denies chest pain, palpitations RESP- + SOB,+ cough, +wheeze ABD- denies N/V, change in stools, abd pain Neuro- denies headache, dizziness, syncope, seizure activity       Objective:    BP 142/78  Pulse 90  Temp(Src) 97.7 F (36.5 C) (Oral)  Resp 20  Ht 5\' 9"  (1.753 m)  Wt 274 lb (124.286 kg)  BMI 40.44 kg/m2  SpO2 96% GEN- NAD, alert and oriented x3 HEENT- PERRL, EOMI, non injected sclera, pink conjunctiva, MMM, oropharynx clear Neck- Supple, no LAD CVS- RRR, no murmur RESP- bilateral wheeze/bronchospasm, decreased air movement at bases rhonchi L >R, sat 95% RA, increased WOB with exertion ABD-NABS,soft,NT,ND EXT- No edema Pulses- Radial 2+  S/P DUONEB,  --  Improved air movement, bilateral wheeze, and ronchi        Assessment & Plan:      Problem List Items Addressed This Visit   COPD exacerbation - Primary     Pt appears to decompensate quickly, given neb in office, Depo Medrol Start prednisone taper, Levaquin  F/U Friday for recheck on Breathing before weekend, continue oxygen therapy     Relevant Medications   predniSONE (DELTASONE) tablet      methylPREDNISolone acetate (DEPO-MEDROL) injection 40 mg (Completed)      Note: This dictation was prepared with Dragon dictation along with smaller phrase technology. Any transcriptional errors that result from this process are unintentional.

## 2014-03-23 NOTE — Patient Instructions (Signed)
Stop your Chloroquine medication while on antibiotics for breathing Start prednisone tablets tomorrow Use Mucinex DM  Recheck on Friday

## 2014-03-26 ENCOUNTER — Ambulatory Visit (INDEPENDENT_AMBULATORY_CARE_PROVIDER_SITE_OTHER): Payer: Managed Care, Other (non HMO) | Admitting: Family Medicine

## 2014-03-26 VITALS — BP 140/78 | HR 87 | Temp 98.0°F | Resp 20 | Ht 70.0 in | Wt 274.0 lb

## 2014-03-26 DIAGNOSIS — J441 Chronic obstructive pulmonary disease with (acute) exacerbation: Secondary | ICD-10-CM

## 2014-03-26 MED ORDER — BUDESONIDE-FORMOTEROL FUMARATE 160-4.5 MCG/ACT IN AERO: 2.0000 | INHALATION_SPRAY | Freq: Every day | RESPIRATORY_TRACT | Status: DC

## 2014-03-26 MED ORDER — ALBUTEROL SULFATE (2.5 MG/3ML) 0.083% IN NEBU: 2.5000 mg | INHALATION_SOLUTION | RESPIRATORY_TRACT | Status: DC | PRN

## 2014-03-26 NOTE — Patient Instructions (Signed)
Continue current medications  F/U as previous Dr. Dennard Schaumann

## 2014-03-26 NOTE — Assessment & Plan Note (Signed)
His exacerbation is improving. He is maintaining his oxygen sats 97% was actually on room air. I will have him complete the medications no further intervention needed at this time. He understands when to go to the emergency room.

## 2014-03-26 NOTE — Progress Notes (Signed)
Patient ID: [name], male   DOB: [birth year], 63 y.o.   MRN: [id number]   Subjective:    Patient ID: [name], male    DOB: Jul 07, 1951, 63 y.o.   MRN: [id number]  Patient presents for F/U COPD Exacerbation  patient here to followup COPD exacerbation. He was seen on Tuesday with exacerbation. He was given Depo-Medrol he also start a prednisone taper and antibiotics. He states that his breathing is much improved she still using 2-3 L when needed. He is using his nebulizer 3 times a day with improvement. Not had any fever. Denies any chest pain.    Review Of Systems:  GEN- denies fatigue, fever, weight loss,weakness, recent illness HEENT- denies eye drainage, change in vision, nasal discharge, CVS- denies chest pain, palpitations RESP- +SOB, +cough,+ wheeze Neuro- denies headache, dizziness, syncope, seizure activity       Objective:    BP 140/78  Pulse 87  Temp(Src) 98 F (36.7 C) (Oral)  Resp 20  Ht 5\' 10"  (1.778 m)  Wt 274 lb (124.286 kg)  BMI 39.32 kg/m2  SpO2 97% GEN- NAD, alert and oriented x3 HEENT- PERRL, EOMI, non injected sclera, pink conjunctiva, MMM, oropharynx clear Neck- Supple, no LAD CVS- RRR, no murmur RESP- bilateral wheeze/bronchospasm, good  air movement at bases rhonchi L >R, sat  EXT- No edema Pulses- Radial 2+       Assessment & Plan:      Problem List Items Addressed This Visit   None      Note: This dictation was prepared with Dragon dictation along with smaller phrase technology. Any transcriptional errors that result from this process are unintentional.

## 2014-04-13 ENCOUNTER — Encounter: Payer: Self-pay | Admitting: Nurse Practitioner

## 2014-04-13 ENCOUNTER — Ambulatory Visit (INDEPENDENT_AMBULATORY_CARE_PROVIDER_SITE_OTHER): Payer: Managed Care, Other (non HMO) | Admitting: Nurse Practitioner

## 2014-04-13 VITALS — BP 144/69 | HR 106 | Wt 275.0 lb

## 2014-04-13 DIAGNOSIS — R55 Syncope and collapse: Secondary | ICD-10-CM

## 2014-04-13 MED ORDER — TOPIRAMATE 50 MG PO TABS: 50.0000 mg | ORAL_TABLET | Freq: Two times a day (BID) | ORAL | Status: DC

## 2014-04-13 NOTE — Patient Instructions (Signed)
Keep Topirimate dose the same as you are currently taking for your passing-out spells.   Follow up in 6 months, sooner as needed.

## 2014-04-13 NOTE — Progress Notes (Signed)
I have read the note, and I agree with the clinical assessment and plan.  Charles K Willis   

## 2014-04-13 NOTE — Progress Notes (Signed)
PATIENT: [name] DOB: December 13, 1950  REASON FOR VISIT: routine follow up for syncope HISTORY FROM: patient  HISTORY OF PRESENT ILLNESS: [name] is a 63 year old right-handed white male with a history of obesity, diabetes, hypertension, and syncope. The patient has a history of lung cancer as well, and he continues to smoke. The patient had syncopal events that always occur while standing, and usually are associated with some visual alteration prior to the event. The episodes tend to occur after he has been sitting for greater than 40 minutes. The patient has never had an event while sitting or lying down. The patient will have a blackout without warning at times. The patient may have some jerking prior to the onset of syncope, and the patient occasionally will have urinary incontinence. The patient usually wakes up with urgency of the bladder, and he has to get to the bathroom quickly. The patient indicates that when he was placed on Topamax at 25 mg twice daily, he went from having almost daily events to one event every month or 2. The patient has undergone a prolonged heart monitor that was reportedly unremarkable, and carotid Doppler studies were unremarkable. The patient had MRI brain evaluation that showed mild small vessel disease. An EEG study was normal.   UPDATE 04/13/14 (LL):  Since last visit patient has had only 1-2 episodes of dizziness since being on the increased dose of topamax, 50 mg bid.  He has not lost consciousness.  He reports no side effects of the medication.  He had a flare up of his COPD recently, treated with antibiotics.    REVIEW OF SYSTEMS: Full 14 system review of systems performed and notable only for:  Leg cramps   ALLERGIES: Allergies  Allergen Reactions  . Actos [Pioglitazone] Other (See Comments)    edema  . Avandia [Rosiglitazone] Other (See Comments)    edema  . Dilaudid [Hydromorphone Hcl]     Made him crazy    HOME  MEDICATIONS: Outpatient Prescriptions Prior to Visit  Medication Sig Dispense Refill  . albuterol (PROVENTIL) (2.5 MG/3ML) 0.083% nebulizer solution Take 3 mLs (2.5 mg total) by nebulization every 4 (four) hours as needed. For shortness of breath  75 mL  10  . aspirin EC 81 MG tablet Take 81 mg by mouth daily.      Marland Kitchen atorvastatin (LIPITOR) 40 MG tablet Take 40 mg by mouth daily.      . budesonide-formoterol (SYMBICORT) 160-4.5 MCG/ACT inhaler Inhale 2 puffs into the lungs daily.  1 Inhaler  6  . Calcium Carbonate-Vitamin D (CALCIUM 600/VITAMIN D PO) Take 1 tablet by mouth daily.      . chloroquine (ARALEN) 250 MG tablet 1 tab po qd x 2 weeks then 1 tab po q week  30 tablet  1  . diltiazem (CARDIZEM CD) 120 MG 24 hr capsule TAKE ONE CAPSULE BY MOUTH EVERY DAY  90 capsule  1  . FLUoxetine (PROZAC) 40 MG capsule TAKE ONE CAPSULE BY MOUTH EVERY DAY  90 capsule  1  . gabapentin (NEURONTIN) 300 MG capsule Take 300 mg by mouth 4 (four) times daily.      Marland Kitchen HYDROcodone-acetaminophen (NORCO) 5-325 MG per tablet Take 1 tablet by mouth every 6 (six) hours as needed for moderate pain.  60 tablet  0  . insulin aspart (NOVOLOG) 100 UNIT/ML injection Inject 10-25 Units into the skin 3 (three) times daily before meals. CBG < 70: implement hypoglycemia protocol CBG 70 - 120: 0  units CBG 121 - 150: 5 units CBG 151 - 200: 10 units CBG 201 - 250: 15 units CBG 251 - 300: 20 units CBG 301 - 350: 25 units CBG 351 - 400: 30 units  1 vial    . insulin glargine (LANTUS) 100 UNIT/ML injection Inject 0.8 mLs (80 Units total) into the skin every morning.  10 mL  12  . losartan (COZAAR) 25 MG tablet Take 25 mg by mouth daily.      . Magnesium 250 MG TABS Take 500 mg by mouth 2 (two) times daily.       Marland Kitchen moxifloxacin (AVELOX) 400 MG tablet Take 1 tablet (400 mg total) by mouth daily.  5 tablet  0  . Multiple Vitamin (MULTIVITAMIN WITH MINERALS) TABS Take 1 tablet by mouth daily.      . nitroGLYCERIN (NITROSTAT) 0.4 MG SL  tablet Place 0.4 mg under the tongue every 5 (five) minutes as needed for chest pain.      Marland Kitchen olopatadine (PATANOL) 0.1 % ophthalmic solution Place 1 drop into both eyes 2 (two) times daily.  5 mL  12  . predniSONE (DELTASONE) 10 MG tablet Take 40mg  x 3 days,20mg  x 3 days, 10mg  x 3 days  21 tablet  0  . ranitidine (ZANTAC) 150 MG capsule Take 1 capsule (150 mg total) by mouth 2 (two) times daily.  60 capsule  11  . tiotropium (SPIRIVA) 18 MCG inhalation capsule Place 1 capsule (18 mcg total) into inhaler and inhale daily.  90 capsule  3  . traZODone (DESYREL) 100 MG tablet TAKE ONE TABLET BY MOUTH EVERY NIGHT AT BEDTIME  90 tablet  1  . topiramate (TOPAMAX) 50 MG tablet Take 1 tablet (50 mg total) by mouth 2 (two) times daily.  180 tablet  1   No facility-administered medications prior to visit.   PHYSICAL EXAM  Filed Vitals:   04/13/14 1120  BP: 144/69  Pulse: 106  Weight: 275 lb (124.739 kg)   Body mass index is 39.46 kg/(m^2). No exam data present  Physical Exam  General: The patient is alert and cooperative at the time of the examination. The patient is markedly obese.  Skin: 1+ edema at the ankles is noted bilaterally.   Neurologic Exam  Mental status: The patient is oriented x 3.  Cranial nerves: Facial symmetry is present. Speech is normal, no aphasia or dysarthria is noted. Extraocular movements are full. Visual fields are full.  Motor: The patient has good strength in all 4 extremities.  Sensory examination: Soft touch sensation on the face, arms, and legs is symmetric.  Coordination: The patient has good finger-nose-finger and heel-to-shin bilaterally.  Gait and station: The patient has a normal gait. Tandem gait is unsteady.  Romberg is negative. No drift is seen.  Reflexes: Deep tendon reflexes are symmetric.    ASSESSMENT AND PLAN 63 y.o. year old male  has a past medical history of Anemia; Allergy; Thyroid disease; Parathyroid disease; Headache(784.0); Hypertension;  Coronary artery disease; Myocardial infarct; Asthma; Emphysema; COPD (chronic obstructive pulmonary disease); Shortness of breath; Bronchitis; Productive cough; Sleep apnea; Peripheral neuropathy; Lung mass; Back pain; H/O hiatal hernia; GERD (gastroesophageal reflux disease); Blood transfusion; Glaucoma; Depression; Insomnia; Anginal pain; Chronic kidney disease; Pneumonia; Type II diabetes mellitus; Arthritis; Syncope and collapse (05/26/2013); Obesity; Periodic limb movements of sleep; and Cancer here to follow up for Syncope.  The patient has apparently gained significant benefit with the Topamax. Continue taking 50 mg twice daily. Followup through this  office in 6 months, sooner as needed.  Meds ordered this encounter  Medications  . topiramate (TOPAMAX) 50 MG tablet    Sig: Take 1 tablet (50 mg total) by mouth 2 (two) times daily.    Dispense:  180 tablet    Refill:  2    Order Specific Question:  Supervising Provider    Answer:  Penni Bombard [[id number]]   Return in about 6 months (around 10/13/2014).  Philmore Pali, MSN, NP-C 04/13/2014, 12:13 PM [facility] [address], [place], [place] [address] [phone]  Note: This document was prepared with digital dictation and possible smart phrase technology. Any transcriptional errors that result from this process are unintentional.

## 2014-04-16 ENCOUNTER — Other Ambulatory Visit: Payer: Self-pay | Admitting: *Deleted

## 2014-04-16 DIAGNOSIS — C3490 Malignant neoplasm of unspecified part of unspecified bronchus or lung: Secondary | ICD-10-CM

## 2014-05-21 ENCOUNTER — Other Ambulatory Visit: Payer: Self-pay | Admitting: Family Medicine

## 2014-05-21 NOTE — Telephone Encounter (Signed)
Refill appropriate and filled per protocol. 

## 2014-05-25 ENCOUNTER — Emergency Department (HOSPITAL_COMMUNITY)
Admission: EM | Admit: 2014-05-25 | Discharge: 2014-05-25 | Disposition: A | Discharge status: Home / Self Care | Payer: Managed Care, Other (non HMO) | Attending: Emergency Medicine | Admitting: Emergency Medicine

## 2014-05-25 ENCOUNTER — Encounter (HOSPITAL_COMMUNITY): Payer: Self-pay | Admitting: Emergency Medicine

## 2014-05-25 ENCOUNTER — Emergency Department (HOSPITAL_COMMUNITY): Payer: Managed Care, Other (non HMO)

## 2014-05-25 DIAGNOSIS — F17200 Nicotine dependence, unspecified, uncomplicated: Secondary | ICD-10-CM | POA: Insufficient documentation

## 2014-05-25 DIAGNOSIS — Z792 Long term (current) use of antibiotics: Secondary | ICD-10-CM | POA: Insufficient documentation

## 2014-05-25 DIAGNOSIS — F329 Major depressive disorder, single episode, unspecified: Secondary | ICD-10-CM | POA: Insufficient documentation

## 2014-05-25 DIAGNOSIS — N189 Chronic kidney disease, unspecified: Secondary | ICD-10-CM | POA: Insufficient documentation

## 2014-05-25 DIAGNOSIS — Z9861 Coronary angioplasty status: Secondary | ICD-10-CM | POA: Insufficient documentation

## 2014-05-25 DIAGNOSIS — I129 Hypertensive chronic kidney disease with stage 1 through stage 4 chronic kidney disease, or unspecified chronic kidney disease: Secondary | ICD-10-CM | POA: Insufficient documentation

## 2014-05-25 DIAGNOSIS — I252 Old myocardial infarction: Secondary | ICD-10-CM | POA: Insufficient documentation

## 2014-05-25 DIAGNOSIS — E669 Obesity, unspecified: Secondary | ICD-10-CM | POA: Insufficient documentation

## 2014-05-25 DIAGNOSIS — M129 Arthropathy, unspecified: Secondary | ICD-10-CM | POA: Insufficient documentation

## 2014-05-25 DIAGNOSIS — Z862 Personal history of diseases of the blood and blood-forming organs and certain disorders involving the immune mechanism: Secondary | ICD-10-CM | POA: Insufficient documentation

## 2014-05-25 DIAGNOSIS — Z8701 Personal history of pneumonia (recurrent): Secondary | ICD-10-CM | POA: Insufficient documentation

## 2014-05-25 DIAGNOSIS — Z8669 Personal history of other diseases of the nervous system and sense organs: Secondary | ICD-10-CM | POA: Insufficient documentation

## 2014-05-25 DIAGNOSIS — Z79899 Other long term (current) drug therapy: Secondary | ICD-10-CM | POA: Insufficient documentation

## 2014-05-25 DIAGNOSIS — Z85118 Personal history of other malignant neoplasm of bronchus and lung: Secondary | ICD-10-CM | POA: Insufficient documentation

## 2014-05-25 DIAGNOSIS — R739 Hyperglycemia, unspecified: Secondary | ICD-10-CM

## 2014-05-25 DIAGNOSIS — Z7982 Long term (current) use of aspirin: Secondary | ICD-10-CM | POA: Insufficient documentation

## 2014-05-25 DIAGNOSIS — K219 Gastro-esophageal reflux disease without esophagitis: Secondary | ICD-10-CM | POA: Insufficient documentation

## 2014-05-25 DIAGNOSIS — J441 Chronic obstructive pulmonary disease with (acute) exacerbation: Secondary | ICD-10-CM

## 2014-05-25 DIAGNOSIS — E119 Type 2 diabetes mellitus without complications: Secondary | ICD-10-CM | POA: Insufficient documentation

## 2014-05-25 DIAGNOSIS — J438 Other emphysema: Secondary | ICD-10-CM | POA: Insufficient documentation

## 2014-05-25 DIAGNOSIS — I2510 Atherosclerotic heart disease of native coronary artery without angina pectoris: Secondary | ICD-10-CM | POA: Insufficient documentation

## 2014-05-25 DIAGNOSIS — Z794 Long term (current) use of insulin: Secondary | ICD-10-CM | POA: Insufficient documentation

## 2014-05-25 DIAGNOSIS — Z9981 Dependence on supplemental oxygen: Secondary | ICD-10-CM | POA: Insufficient documentation

## 2014-05-25 DIAGNOSIS — F3289 Other specified depressive episodes: Secondary | ICD-10-CM | POA: Insufficient documentation

## 2014-05-25 DIAGNOSIS — Z9889 Other specified postprocedural states: Secondary | ICD-10-CM | POA: Insufficient documentation

## 2014-05-25 LAB — PRO B NATRIURETIC PEPTIDE: Pro B Natriuretic peptide (BNP): 201 pg/mL — ABNORMAL HIGH (ref 0–125)

## 2014-05-25 LAB — BASIC METABOLIC PANEL
Anion gap: 12 (ref 5–15)
BUN: 26 mg/dL — AB (ref 6–23)
CO2: 21 mEq/L (ref 19–32)
Calcium: 8.8 mg/dL (ref 8.4–10.5)
Chloride: 105 mEq/L (ref 96–112)
Creatinine, Ser: 1.46 mg/dL — ABNORMAL HIGH (ref 0.50–1.35)
GFR calc Af Amer: 57 mL/min — ABNORMAL LOW (ref >90)
GFR calc non Af Amer: 49 mL/min — ABNORMAL LOW (ref >90)
Glucose, Bld: 320 mg/dL — ABNORMAL HIGH (ref 70–99)
POTASSIUM: 4.6 meq/L (ref 3.7–5.3)
SODIUM: 138 meq/L (ref 137–147)

## 2014-05-25 LAB — CBC
HEMATOCRIT: 34.6 % — AB (ref 39.0–52.0)
Hemoglobin: 11.6 g/dL — ABNORMAL LOW (ref 13.0–17.0)
MCH: 30.4 pg (ref 26.0–34.0)
MCHC: 33.5 g/dL (ref 30.0–36.0)
MCV: 90.6 fL (ref 78.0–100.0)
Platelets: 230 10*3/uL (ref 150–400)
RBC: 3.82 MIL/uL — ABNORMAL LOW (ref 4.22–5.81)
RDW: 13.4 % (ref 11.5–15.5)
WBC: 14.2 10*3/uL — ABNORMAL HIGH (ref 4.0–10.5)

## 2014-05-25 LAB — I-STAT TROPONIN, ED: TROPONIN I, POC: 0 ng/mL (ref 0.00–0.08)

## 2014-05-25 LAB — CBG MONITORING, ED: Glucose-Capillary: 230 mg/dL — ABNORMAL HIGH (ref 70–99)

## 2014-05-25 MED ORDER — PREDNISONE 20 MG PO TABS: ORAL_TABLET | ORAL | Status: DC

## 2014-05-25 MED ORDER — IPRATROPIUM-ALBUTEROL 0.5-2.5 (3) MG/3ML IN SOLN
3.0000 mL | Freq: Once | RESPIRATORY_TRACT | Status: AC
  Administered 2014-05-25: 3 mL via RESPIRATORY_TRACT
  Filled 2014-05-25: qty 3

## 2014-05-25 MED ORDER — PREDNISONE 20 MG PO TABS
60.0000 mg | ORAL_TABLET | Freq: Once | ORAL | Status: AC
  Administered 2014-05-25: 60 mg via ORAL
  Filled 2014-05-25: qty 3

## 2014-05-25 NOTE — ED Provider Notes (Signed)
CSN: [id number]     Arrival date & time 05/25/14  1717 History   First MD Initiated Contact with Patient 05/25/14 1903     Chief Complaint  Patient presents with  . Shortness of Breath     (Consider location/radiation/quality/duration/timing/severity/associated sxs/prior Treatment) The history is provided by the patient.  AUBURN HERT is a 63 y.o. male hx of HTN, MI, COPD on home O2 here with shortness of breath. He was riding on his lawn mower today and got short of breath. He is constantly on home oxygen. He has been short of breath with minimal exertion over the last month. Finished a course of steroids and antibiotics several weeks ago. Denies worsening leg swelling. Denies chest pain.    Past Medical History  Diagnosis Date  . Anemia   . Allergy   . Thyroid disease   . Parathyroid disease   . Headache(784.0)   . Hypertension     takes  HYzaar daily  . Coronary artery disease     has 1 stent  . Myocardial infarct   . Asthma   . Emphysema     sees Dr.Ramaswami for this  . COPD (chronic obstructive pulmonary disease)     uses Albuterol and Spiriva daily  . Shortness of breath     with exertion   . Bronchitis   . Productive cough     white in color but no odor  . Sleep apnea     uses BiPaP  . Peripheral neuropathy   . Lung mass     right upper lobe  . Back pain     4 deteriorating disc and receives an injection q3-19mon;has been doing this for about 25yrs  . H/O hiatal hernia   . GERD (gastroesophageal reflux disease)     takes Prilosec daily  . Blood transfusion     as a child  . Glaucoma     hx of  . Depression     takes Prozac daily  . Insomnia     takes Trazodone nightly  . Anginal pain   . Chronic kidney disease     acute kidney failure post surgery  . Pneumonia     hx of' 63 yo,rd' last time in 2012  . Type II diabetes mellitus     takes Metformin bid and Novolog and Lantus daily  . Arthritis     "hands"  . Syncope and collapse 05/26/2013  .  Obesity   . Periodic limb movements of sleep   . Cancer     Non-small cell lung cancer   Past Surgical History  Procedure Laterality Date  . Carpal tunnel release      bilateral  . Rotator cuff repair      left  . Colonoscopy    . Esophagogastroduodenoscopy    . Eye surgery    . Lobectomy  03/17/2012    upper right side with resection  . Elbow surgery      ulnar nerve; left  . Cardiac catheterization  2006/2008/2009  . Coronary angioplasty with stent placement      1 stent  . Refractive surgery      bilaterally  . Inguinal hernia repair  14's    double inguinal   Family History  Problem Relation Age of Onset  . COPD Mother   . Diabetes Mother   . Hypertension Mother   . Heart attack Father   . Diabetes Father   . Anesthesia problems Neg Hx   .  Hypotension Neg Hx   . Malignant hyperthermia Neg Hx   . Pseudochol deficiency Neg Hx    History  Substance Use Topics  . Smoking status: Current Every Day Smoker -- 1.00 packs/day for 45 years    Types: Cigarettes    Last Attempt to Quit: 03/26/2012  . Smokeless tobacco: Never Used  . Alcohol Use: Yes     Comment: gallon per week liquor "Haven't drink in 6 weeks"    Review of Systems  Respiratory: Positive for shortness of breath.   All other systems reviewed and are negative.     Allergies  Actos; Avandia; and Dilaudid  Home Medications   Prior to Admission medications   Medication Sig Start Date End Date Taking? Authorizing Provider  albuterol (PROVENTIL) (2.5 MG/3ML) 0.083% nebulizer solution Take 3 mLs (2.5 mg total) by nebulization every 4 (four) hours as needed. For shortness of breath 03/26/14   Alycia Rossetti, MD  aspirin EC 81 MG tablet Take 81 mg by mouth daily.    Historical Provider, MD  atorvastatin (LIPITOR) 40 MG tablet Take 40 mg by mouth daily.    Historical Provider, MD  budesonide-formoterol (SYMBICORT) 160-4.5 MCG/ACT inhaler Inhale 2 puffs into the lungs daily. 03/26/14   Alycia Rossetti,  MD  Calcium Carbonate-Vitamin D (CALCIUM 600/VITAMIN D PO) Take 1 tablet by mouth daily.    Historical Provider, MD  chloroquine (ARALEN) 250 MG tablet 1 tab po qd x 2 weeks then 1 tab po q week 01/26/14   [name], MD  diltiazem (CARDIZEM CD) 120 MG 24 hr capsule TAKE ONE CAPSULE BY MOUTH EVERY DAY 12/04/13   [name], MD  FLUoxetine (PROZAC) 40 MG capsule TAKE ONE CAPSULE BY MOUTH EVERY DAY 02/17/14   [name], MD  gabapentin (NEURONTIN) 300 MG capsule TAKE 1 CAPSULE BY MOUTH THREE TIMES DAILY 05/21/14   Alycia Rossetti, MD  HYDROcodone-acetaminophen (NORCO) 5-325 MG per tablet Take 1 tablet by mouth every 6 (six) hours as needed for moderate pain. 03/23/14   Alycia Rossetti, MD  insulin aspart (NOVOLOG) 100 UNIT/ML injection Inject 10-25 Units into the skin 3 (three) times daily before meals. CBG < 70: implement hypoglycemia protocol CBG 70 - 120: 0 units CBG 121 - 150: 5 units CBG 151 - 200: 10 units CBG 201 - 250: 15 units CBG 251 - 300: 20 units CBG 301 - 350: 25 units CBG 351 - 400: 30 units 09/29/12   Janece Canterbury, MD  insulin glargine (LANTUS) 100 UNIT/ML injection Inject 0.8 mLs (80 Units total) into the skin every morning. 09/02/13   Charlynne Cousins, MD  losartan (COZAAR) 25 MG tablet Take 25 mg by mouth daily.    Historical Provider, MD  Magnesium 250 MG TABS Take 500 mg by mouth 2 (two) times daily.     Historical Provider, MD  moxifloxacin (AVELOX) 400 MG tablet Take 1 tablet (400 mg total) by mouth daily. 03/23/14   Alycia Rossetti, MD  Multiple Vitamin (MULTIVITAMIN WITH MINERALS) TABS Take 1 tablet by mouth daily.    Historical Provider, MD  nitroGLYCERIN (NITROSTAT) 0.4 MG SL tablet Place 0.4 mg under the tongue every 5 (five) minutes as needed for chest pain.    Historical Provider, MD  olopatadine (PATANOL) 0.1 % ophthalmic solution Place 1 drop into both eyes 2 (two) times daily. 02/23/13   [name], MD  predniSONE (DELTASONE) 10 MG tablet  Take 40mg  x 3 days,20mg  x  3 days, 10mg  x 3 days 03/23/14   Alycia Rossetti, MD  ranitidine (ZANTAC) 150 MG capsule Take 1 capsule (150 mg total) by mouth 2 (two) times daily. 05/05/13   [name], MD  tiotropium (SPIRIVA) 18 MCG inhalation capsule Place 1 capsule (18 mcg total) into inhaler and inhale daily. 04/30/13   Brand Males, MD  topiramate (TOPAMAX) 50 MG tablet Take 1 tablet (50 mg total) by mouth 2 (two) times daily. 04/13/14   Philmore Pali, NP  traZODone (DESYREL) 100 MG tablet TAKE ONE TABLET BY MOUTH EVERY NIGHT AT BEDTIME    [name], MD   BP 113/50  Pulse 79  Temp(Src) 97 F (36.1 C) (Oral)  Resp 21  Ht 5\' 11"  (1.803 m)  Wt 275 lb (124.739 kg)  BMI 38.37 kg/m2  SpO2 95% Physical Exam  Nursing note and vitals reviewed. Constitutional: He is oriented to person, place, and time.  Chronically ill, slightly tachypneic   HENT:  Head: Normocephalic.  Mouth/Throat: Oropharynx is clear and moist.  Eyes: Conjunctivae are normal. Pupils are equal, round, and reactive to light.  Neck: Normal range of motion. Neck supple.  Cardiovascular: Normal rate, regular rhythm and normal heart sounds.   Pulmonary/Chest:  Slightly tachypneic, + diffuse wheezing. No retractions.   Abdominal: Soft. Bowel sounds are normal. He exhibits no distension. There is no tenderness. There is no rebound and no guarding.  Musculoskeletal: Normal range of motion.  Neurological: He is alert and oriented to person, place, and time. No cranial nerve deficit. Coordination normal.  Skin: Skin is warm and dry.  Psychiatric: He has a normal mood and affect. Judgment and thought content normal.    ED Course  Procedures (including critical care time) Labs Review Labs Reviewed  CBC - Abnormal; Notable for the following:    WBC 14.2 (*)    RBC 3.82 (*)    Hemoglobin 11.6 (*)    HCT 34.6 (*)    All other components within normal limits  BASIC METABOLIC PANEL - Abnormal; Notable for the following:     Glucose, Bld 320 (*)    BUN 26 (*)    Creatinine, Ser 1.46 (*)    GFR calc non Af Amer 49 (*)    GFR calc Af Amer 57 (*)    All other components within normal limits  PRO B NATRIURETIC PEPTIDE - Abnormal; Notable for the following:    Pro B Natriuretic peptide (BNP) 201.0 (*)    All other components within normal limits  CBG MONITORING, ED - Abnormal; Notable for the following:    Glucose-Capillary 230 (*)    All other components within normal limits  I-STAT TROPOININ, ED    Imaging Review Dg Chest 2 View  05/25/2014   CLINICAL DATA:  Shortness of breath. History of lung cancer, diabetes and COPD.  EXAM: CHEST  2 VIEW  COMPARISON:  Radiographs 11/11/2013 and 08/30/2013.  CT 05/06/2013.  FINDINGS: There is stable postoperative distortion of the right hilum status post thoracotomy and partial right lung resection. Right lung scarring is stable. The left lung is clear. The heart size and mediastinal contours are stable. No acute osseous findings are evident. Degenerative changes throughout the spine are stable. There are postsurgical changes of the distal right clavicle.  IMPRESSION: Stable postoperative chest. No evidence of metastatic disease or acute process.   Electronically Signed   By: Camie Patience M.D.   On: 05/25/2014 19:26     EKG Interpretation  None      MDM   Final diagnoses:  None    KIRIN PASTORINO is a 63 y.o. male here with SOB. Likely COPD exacerbation. He is on home oxygen and is not using accessory muscles. His labs are baseline and CBG slightly elevated but not in DKA. Will give albuterol, prednisone.   8:41 PM Minimal wheezing after nebs, steroid. Patient wants to go home. CBG on d/c was 230. Will d/c home on steroids. I told him to check sugar often and give himself appropriate dose of insulin sliding scale.      Wandra Arthurs, MD 05/25/14 2041

## 2014-05-25 NOTE — Discharge Instructions (Signed)
Take prednisone as prescribed.   Use albuterol every 4 hrs for 2 days then as needed.   Wear oxygen at all times.   Follow up with your doctor.   Return to ER if you have trouble breathing, wheezing, chest pain.

## 2014-05-25 NOTE — ED Notes (Signed)
Pt discharged home with all belongings, pt alert, oriented, and ambulatory upon discharge. 1 new RX prescribed, pt verbalizes understanding of discharge instructions. Pt escorted to exit via wheel chair by [employer]

## 2014-05-25 NOTE — ED Notes (Signed)
Pt reports sob since 2 pm, sts everytime he walks he becomes SOB  For over a month now. Pt is on home oxygen also reports chest pain.

## 2014-05-26 ENCOUNTER — Encounter: Payer: Self-pay | Admitting: Surgery

## 2014-05-26 ENCOUNTER — Ambulatory Visit
Admission: RE | Admit: 2014-05-26 | Discharge: 2014-05-26 | Disposition: A | Discharge status: Home / Self Care | Payer: Managed Care, Other (non HMO) | Source: Ambulatory Visit | Attending: Surgery | Admitting: Surgery

## 2014-05-26 ENCOUNTER — Ambulatory Visit (INDEPENDENT_AMBULATORY_CARE_PROVIDER_SITE_OTHER): Payer: Managed Care, Other (non HMO) | Admitting: Surgery

## 2014-05-26 VITALS — BP 106/58 | HR 94 | Resp 20 | Ht 71.0 in | Wt 275.0 lb

## 2014-05-26 DIAGNOSIS — C3490 Malignant neoplasm of unspecified part of unspecified bronchus or lung: Secondary | ICD-10-CM

## 2014-05-26 DIAGNOSIS — Z09 Encounter for follow-up examination after completed treatment for conditions other than malignant neoplasm: Secondary | ICD-10-CM

## 2014-05-27 ENCOUNTER — Other Ambulatory Visit: Payer: Self-pay | Admitting: Family Medicine

## 2014-05-28 ENCOUNTER — Encounter: Payer: Self-pay | Admitting: Surgery

## 2014-05-28 NOTE — Progress Notes (Signed)
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 Since I last saw him in January 2015 he has had recurrent bouts of COPD exacerbation and is now on prednisone after a recent episode. He continues to abstain from smoking but does use an Engineer, manufacturing. He has a chronic cough but no sputum or hemoptysis. He denies headaches or visual changes. His appetite has been good and weight has been increasing.    Current Outpatient Prescriptions  Medication Sig Dispense Refill  . albuterol (PROVENTIL) (2.5 MG/3ML) 0.083% nebulizer solution Take 3 mLs (2.5 mg total) by nebulization every 4 (four) hours as needed. For shortness of breath  75 mL  10  . aspirin EC 81 MG tablet Take 81 mg by mouth daily.      Marland Kitchen atorvastatin (LIPITOR) 40 MG tablet Take 40 mg by mouth daily.      . budesonide-formoterol (SYMBICORT) 160-4.5 MCG/ACT inhaler Inhale 2 puffs into the lungs daily.  1 Inhaler  6  . Calcium Carbonate-Vitamin D (CALCIUM 600/VITAMIN D PO) Take 1 tablet by mouth daily.      . chloroquine (ARALEN) 250 MG tablet 1 tab po qd x 2 weeks then 1 tab po q week  30 tablet  1  . FLUoxetine (PROZAC) 40 MG capsule TAKE ONE CAPSULE BY MOUTH EVERY DAY  90 capsule  1  . gabapentin (NEURONTIN) 300 MG capsule TAKE 1 CAPSULE BY MOUTH THREE TIMES DAILY  270 capsule  3  . HYDROcodone-acetaminophen (NORCO) 5-325 MG per tablet Take 1 tablet by mouth every 6 (six) hours as needed for moderate pain.  60 tablet  0  . insulin aspart (NOVOLOG) 100 UNIT/ML injection Inject 10-25 Units into the skin 3 (three) times daily before meals. CBG < 70: implement hypoglycemia protocol CBG 70 - 120: 0 units CBG 121 - 150: 5 units CBG 151 - 200: 10 units CBG 201 - 250: 15 units CBG 251 - 300: 20 units CBG 301 - 350: 25 units CBG 351 - 400: 30 units  1 vial    . insulin glargine  (LANTUS) 100 UNIT/ML injection Inject 0.8 mLs (80 Units total) into the skin every morning.  10 mL  12  . losartan (COZAAR) 25 MG tablet Take 25 mg by mouth daily.      . Magnesium 250 MG TABS Take 500 mg by mouth 2 (two) times daily.       . Multiple Vitamin (MULTIVITAMIN WITH MINERALS) TABS Take 1 tablet by mouth daily.      . nitroGLYCERIN (NITROSTAT) 0.4 MG SL tablet Place 0.4 mg under the tongue every 5 (five) minutes as needed for chest pain.      Marland Kitchen olopatadine (PATANOL) 0.1 % ophthalmic solution Place 1 drop into both eyes 2 (two) times daily.  5 mL  12  . predniSONE (DELTASONE) 20 MG tablet Take 60 mg daily x 2 days then 40 mg daily x 2 days then 20 mg daily x 2 days  12 tablet  0  . ranitidine (ZANTAC) 150 MG capsule Take 1 capsule (150 mg total) by mouth 2 (two) times daily.  60 capsule  11  . tiotropium (SPIRIVA) 18 MCG inhalation capsule Place 1 capsule (18 mcg total) into inhaler and inhale daily.  90 capsule  3  . topiramate (TOPAMAX) 50 MG tablet Take 1 tablet (50  mg total) by mouth 2 (two) times daily.  180 tablet  2  . traZODone (DESYREL) 100 MG tablet TAKE ONE TABLET BY MOUTH EVERY NIGHT AT BEDTIME  90 tablet  1  . diltiazem (CARDIZEM CD) 120 MG 24 hr capsule TAKE ONE CAPSULE BY MOUTH EVERY DAY  90 capsule  1   No current facility-administered medications for this visit.     Physical Exam: BP 106/58  Pulse 94  Resp 20  Ht 5\' 11"  (1.803 m)  Wt 275 lb (124.739 kg)  BMI 38.37 kg/m2  SpO2 96% He is in no distress on oxygen.  There is no cervical or supraclavicular adenopathy.  Lung exam is clear.  The right thoracotomy scar looks good. There are no skin lesions.  Cardiac exam shows a regular rate and rhythm with normal heart sounds.    Diagnostic Tests:  CLINICAL DATA: History of right upper lobectomy for resection of  right upper lobe lung carcinoma, followup, cough, shortness of  breath  EXAM:  CT CHEST WITHOUT CONTRAST  TECHNIQUE:  Multidetector CT imaging of  the chest was performed following the  standard protocol without IV contrast.  COMPARISON: Chest x-ray of 05/25/2014 and CT chest of 05/06/2013  FINDINGS:  Changes of right upper lobectomy again are noted. Surgical clips  overlie the right suprahilar region. The tiny right middle lobe  nodule described previously is stable. However, there has been some  enlargement of a nodule within the right upper lung field which on  series 4, image number 14 measures 5 mm in diameter. This nodule was  almost not detectable previously measuring no more than 2 mm. A  second nodule is noted in the superior segment of the left lower  lobe on image number 31 measuring 7 mm, not definitely seen  previously. These lesions are worrisome for lung metastases. No  additional lung nodule is seen. No pleural effusion is noted. An old  ununited fracture the right lateral sixth rib is noted.  On soft tissue window images, the thyroid gland is unremarkable. On  this unenhanced study, only small mediastinal nodes are present. A  precarinal node to the right of midline measures 9 mm which is  unchanged. No definite enlarging adenopathy is seen. Coronary artery  calcifications are present primarily in the distribution of the left  anterior descending artery and cardiomegaly is stable. There are  degenerative changes throughout the thoracic spine. No compression  deformity is seen. No lytic or blastic lesion is noted. The upper  abdomen that is visualized is unremarkable.  IMPRESSION:  1. Two lung nodules, 1 in the right upper lung field and a second in  the superior segment of the left lower lobe worrisome for lung  metastases.  2. Stable changes of right upper lobectomy. No definite mediastinal  or hilar adenopathy.  Electronically Signed  By: Ivar Drape M.D.  On: 05/26/2014 12:25   Impression:  He has slight enlargement of the small nodule in the right upper lung field and a second 47mm nodule seen in the  superior segment of the left lower lobe that was not seen previously. Since these lesions are very small and too small to biopsy I would continue close observation in this gentleman with severe COPD. He had a stage I tumor so the risk of recurrence is low. I reviewed the CT scan with him and discussed the reasons for continued close follow up. He is in agreement.  Plan:  I will see him in 6 months  with a CT of the chest.

## 2014-06-14 ENCOUNTER — Telehealth: Payer: Self-pay | Admitting: Family Medicine

## 2014-06-14 NOTE — Telephone Encounter (Signed)
Flexeril 10 q 8 hrs prn cramps.  Is he taking magnesium?

## 2014-06-14 NOTE — Telephone Encounter (Signed)
Patient is calling to let us know that his medication for the cramps is on back order, would like [place] to call in something else if possible please call him back at [phone] [facility]

## 2014-06-14 NOTE — Telephone Encounter (Signed)
LMTRC

## 2014-06-17 MED ORDER — CYCLOBENZAPRINE HCL 10 MG PO TABS: 10.0000 mg | ORAL_TABLET | Freq: Three times a day (TID) | ORAL | Status: DC | PRN

## 2014-06-17 NOTE — Telephone Encounter (Signed)
Pt wife called back and aware of rx flexeril and and she does not know if he taking magnesium  But will find out from him once she talks to him. Rx called out to [facility].

## 2014-07-06 ENCOUNTER — Other Ambulatory Visit: Payer: Self-pay | Admitting: Family Medicine

## 2014-07-06 NOTE — Telephone Encounter (Signed)
Refill appropriate and filled per protocol. 

## 2014-07-22 ENCOUNTER — Other Ambulatory Visit: Payer: Self-pay | Admitting: Family Medicine

## 2014-07-23 NOTE — Telephone Encounter (Signed)
?   OK to Refill  

## 2014-07-23 NOTE — Telephone Encounter (Signed)
ok 

## 2014-08-15 ENCOUNTER — Other Ambulatory Visit: Payer: Self-pay | Admitting: Family Medicine

## 2014-08-29 ENCOUNTER — Other Ambulatory Visit: Payer: Self-pay | Admitting: Family Medicine

## 2014-08-30 NOTE — Telephone Encounter (Signed)
Prescription sent to pharmacy.

## 2014-08-30 NOTE — Telephone Encounter (Signed)
ok 

## 2014-08-30 NOTE — Telephone Encounter (Signed)
?   OK to Refill  

## 2014-09-16 ENCOUNTER — Telehealth: Payer: Self-pay | Admitting: Neurology

## 2014-09-16 NOTE — Telephone Encounter (Signed)
Left message for patient regarding rescheduling 10/13/14 appointment per Jeani Hawking leaving, rescheduled to Dr. Jannifer Franklin first available on 01/14/15.

## 2014-10-13 ENCOUNTER — Ambulatory Visit: Payer: Managed Care, Other (non HMO) | Admitting: Nurse Practitioner

## 2014-10-15 ENCOUNTER — Other Ambulatory Visit: Payer: Self-pay | Admitting: *Deleted

## 2014-10-15 DIAGNOSIS — C3492 Malignant neoplasm of unspecified part of left bronchus or lung: Secondary | ICD-10-CM

## 2014-10-18 ENCOUNTER — Encounter: Payer: Self-pay | Admitting: Family Medicine

## 2014-10-18 ENCOUNTER — Ambulatory Visit (INDEPENDENT_AMBULATORY_CARE_PROVIDER_SITE_OTHER): Payer: Managed Care, Other (non HMO) | Admitting: Family Medicine

## 2014-10-18 ENCOUNTER — Telehealth: Payer: Self-pay | Admitting: Family Medicine

## 2014-10-18 VITALS — BP 138/70 | HR 100 | Temp 99.2°F | Resp 22 | Ht 71.0 in | Wt 268.0 lb

## 2014-10-18 DIAGNOSIS — Z720 Tobacco use: Secondary | ICD-10-CM

## 2014-10-18 DIAGNOSIS — J441 Chronic obstructive pulmonary disease with (acute) exacerbation: Secondary | ICD-10-CM

## 2014-10-18 DIAGNOSIS — C3490 Malignant neoplasm of unspecified part of unspecified bronchus or lung: Secondary | ICD-10-CM

## 2014-10-18 DIAGNOSIS — F17200 Nicotine dependence, unspecified, uncomplicated: Secondary | ICD-10-CM

## 2014-10-18 MED ORDER — HYDROCODONE-ACETAMINOPHEN 5-325 MG PO TABS: 1.0000 | ORAL_TABLET | Freq: Four times a day (QID) | ORAL | Status: DC | PRN

## 2014-10-18 MED ORDER — METHYLPREDNISOLONE ACETATE 40 MG/ML IJ SUSP
40.0000 mg | Freq: Once | INTRAMUSCULAR | Status: AC
  Administered 2014-10-18: 40 mg via INTRAMUSCULAR

## 2014-10-18 MED ORDER — LEVOFLOXACIN 500 MG PO TABS: 500.0000 mg | ORAL_TABLET | Freq: Every day | ORAL | Status: DC

## 2014-10-18 MED ORDER — PREDNISONE 10 MG PO TABS: ORAL_TABLET | ORAL | Status: DC

## 2014-10-18 NOTE — Assessment & Plan Note (Signed)
With his history of lung cancer as well as a recurrent nodules blood-tinged sputum, treat him for exacerbation but will also cover him for pneumonia which is high risk for. His breathing is pretty much at baseline. He will have a scan done in a couple weeks to look at the nodules again hopefully the acute respiratory illnesses clear by that time. I've given him Depo-Medrol in the office he will start with the prednisone taper as well. He will continue his Mucinex. I did refill his pain medication. I did discuss with him importance of coming on a regular basis understanding that he sees a few specialists. He also continues to smoke which is not helping his lung as well

## 2014-10-18 NOTE — Progress Notes (Signed)
Patient ID: [name], male   DOB: October 10, 1951, 63 y.o.   MRN: [id number]   Subjective:    Patient ID: [name], male    DOB: 1951/05/03, 63 y.o.   MRN: [id number]  Patient presents for Congestion 63 year old male with history of COPD as well as history of right-sided lung cancer now with recurrent nodules in both lungs. He is currently on 2 L of oxygen. For the past couple months he's had increased cough with wheezing the past couple weeks he has had greenish yellow mucus which is increased as well as some blood-tinged sputum but that has now resolved. He's been using Mucinex as well as nebulizers and other medications as prescribed. He does continue to smoke every now and then also uses an electronic cigarette. He is scheduled to have a CT of his chest in a couple weeks by his lung doctor. He denies any chest pain abdominal pain. His shortness of breath this at baseline    Review Of Systems:  GEN- denies fatigue, fever, weight loss,weakness, recent illness HEENT- denies eye drainage, change in vision, nasal discharge, CVS- denies chest pain, palpitations RESP- denies SOB, +cough, +wheeze ABD- denies N/V, change in stools, abd pain GU- denies dysuria, hematuria, dribbling, incontinence MSK- + joint pain, muscle aches, injury Neuro- denies headache, dizziness, syncope, seizure activity       Objective:    BP 138/70 mmHg  Pulse 100  Temp(Src) 99.2 F (37.3 C) (Oral)  Resp 22  Ht 5\' 11"  (1.803 m)  Wt 268 lb (121.564 kg)  BMI 37.39 kg/m2  SpO2 96% GEN- NAD, alert and oriented x3 HEENT- PERRL, EOMI, non injected sclera, pink conjunctiva, MMM, oropharynx clear Neck- Supple, no LAD CVS- RRR, no murmur RESP- bilateral wheeze/bronchospasm, good  air movement at bases rhonchi L >R, sat  EXT- No edema Pulses- Radial 2+        Assessment & Plan:      Problem List Items Addressed This Visit      Unprioritized   Non-small cell carcinoma of lung, stage 1   Relevant Medications      predniSONE (DELTASONE) tablet      HYDROcodone-acetaminophen (NORCO) 5-325 MG per tablet      methylPREDNISolone acetate (DEPO-MEDROL) injection 40 mg (Completed)   COPD exacerbation - Primary   Relevant Medications      predniSONE (DELTASONE) tablet      methylPREDNISolone acetate (DEPO-MEDROL) injection 40 mg (Completed)   CIGARETTE SMOKER      Note: This dictation was prepared with Dragon dictation along with smaller phrase technology. Any transcriptional errors that result from this process are unintentional.

## 2014-10-18 NOTE — Telephone Encounter (Signed)
OK to do-

## 2014-10-18 NOTE — Telephone Encounter (Signed)
ok 

## 2014-10-18 NOTE — Telephone Encounter (Signed)
[phone]  Pt is needing a letter on why he is disabled for jury duty

## 2014-10-18 NOTE — Patient Instructions (Signed)
Take antibiotics as prescribed/prednisone start tomorrow Continue mucinex Continue nebs Recheck in 1 week

## 2014-10-25 ENCOUNTER — Encounter: Payer: Self-pay | Admitting: Family Medicine

## 2014-10-25 ENCOUNTER — Ambulatory Visit (INDEPENDENT_AMBULATORY_CARE_PROVIDER_SITE_OTHER): Payer: Managed Care, Other (non HMO) | Admitting: Family Medicine

## 2014-10-25 VITALS — BP 132/78 | HR 100 | Temp 98.2°F | Resp 20 | Ht 71.0 in | Wt 267.0 lb

## 2014-10-25 DIAGNOSIS — J441 Chronic obstructive pulmonary disease with (acute) exacerbation: Secondary | ICD-10-CM

## 2014-10-25 DIAGNOSIS — Z23 Encounter for immunization: Secondary | ICD-10-CM

## 2014-10-25 NOTE — Patient Instructions (Signed)
Continue your current medications F/U as needed or in 4 months- Dr. Dennard Schaumann

## 2014-10-25 NOTE — Progress Notes (Signed)
Patient ID: [name], male   DOB: 1951-04-05, 63 y.o.   MRN: [id number]   Subjective:    Patient ID: [name], male    DOB: 21-Dec-1950, 63 y.o.   MRN: [id number]  Patient presents for F/U  Pt seen on 12/14 due to COPD excerbation  ( here for recheck) at that time given Levaquin, Medrol 40mg  injection and prednisone taper. He continues to smoke, he is also being followed by pulmonary and there is concern for re- current lung cancer.  His cough is now back at his baseline there is only white or clear production. He has not had any fever. He is not. Of breath. He has not been wearing his oxygen 24 hours a day because he states he can breathe better. Due for flu shot   Review Of Systems:  GEN- denies fatigue, fever, weight loss,weakness, recent illness HEENT- denies eye drainage, change in vision, nasal discharge, CVS- denies chest pain, palpitations RESP- denies SOB, +cough, +wheeze       Objective:    BP 132/78 mmHg  Pulse 100  Temp(Src) 98.2 F (36.8 C) (Oral)  Resp 20  Ht 5\' 11"  (1.803 m)  Wt 267 lb (121.11 kg)  BMI 37.26 kg/m2  SpO2 95% GEN- NAD, alert and oriented x3 HEENT-  MMM, oropharynx clear CVS- RRR, no murmur RESP- scattered bilateral wheeze  good  air movement at bases  Mild ronchi, no rales sat 96% on RA EXT- No edema Pulses- Radial 2+       Assessment & Plan:      Problem List Items Addressed This Visit      Unprioritized   COPD exacerbation - Primary    His exacerbation is now resolved he is back at his baseline. I've given him a flu shot. Continue to encourage tobacco cessation. He will continue his regular maintenance nebulizer and inhalers.       Note: This dictation was prepared with Dragon dictation along with smaller phrase technology. Any transcriptional errors that result from this process are unintentional.

## 2014-10-25 NOTE — Addendum Note (Signed)
Addended by: Sheral Flow on: 10/25/2014 11:27 AM   Modules accepted: Orders

## 2014-10-25 NOTE — Assessment & Plan Note (Signed)
His exacerbation is now resolved he is back at his baseline. I've given him a flu shot. Continue to encourage tobacco cessation. He will continue his regular maintenance nebulizer and inhalers.

## 2014-11-03 NOTE — Telephone Encounter (Signed)
Wife called saying that they have never received letter please call her at [phone]

## 2014-11-09 ENCOUNTER — Encounter: Payer: Self-pay | Admitting: Family Medicine

## 2014-11-09 ENCOUNTER — Telehealth: Payer: Self-pay | Admitting: Family Medicine

## 2014-11-09 NOTE — Telephone Encounter (Signed)
[phone] PT wife is wanting to speak to [place] about a letter for excusing her husband from [employer] she states that she was told that someone would have it ready for him and it has been awhile and they have not heard back from anyone. I have checked and there is nothing up front for the pt.

## 2014-11-10 ENCOUNTER — Encounter: Payer: Self-pay | Admitting: Family Medicine

## 2014-11-10 NOTE — Telephone Encounter (Signed)
Wife called back with [employer] duty information.  Jury note written for provider signature.  Wife to pick up tomorrow

## 2014-11-12 NOTE — Telephone Encounter (Signed)
Letter was written for patient and I contacted wife.  She came and picked up letter

## 2014-11-18 ENCOUNTER — Other Ambulatory Visit: Payer: Self-pay | Admitting: Family Medicine

## 2014-11-18 NOTE — Telephone Encounter (Signed)
Medication refilled per protocol. 

## 2014-11-22 ENCOUNTER — Other Ambulatory Visit: Payer: Self-pay | Admitting: Family Medicine

## 2014-11-22 NOTE — Telephone Encounter (Signed)
Refill appropriate and filled per protocol. 

## 2014-12-01 ENCOUNTER — Ambulatory Visit
Admission: RE | Admit: 2014-12-01 | Discharge: 2014-12-01 | Disposition: A | Discharge status: Home / Self Care | Payer: Managed Care, Other (non HMO) | Source: Ambulatory Visit | Attending: Surgery | Admitting: Surgery

## 2014-12-01 ENCOUNTER — Ambulatory Visit (INDEPENDENT_AMBULATORY_CARE_PROVIDER_SITE_OTHER): Payer: Managed Care, Other (non HMO) | Admitting: Surgery

## 2014-12-01 ENCOUNTER — Encounter: Payer: Self-pay | Admitting: Surgery

## 2014-12-01 VITALS — BP 112/62 | HR 59 | Resp 20 | Ht 71.0 in | Wt 268.0 lb

## 2014-12-01 DIAGNOSIS — Z85118 Personal history of other malignant neoplasm of bronchus and lung: Secondary | ICD-10-CM

## 2014-12-01 DIAGNOSIS — C3492 Malignant neoplasm of unspecified part of left bronchus or lung: Secondary | ICD-10-CM

## 2014-12-03 ENCOUNTER — Encounter: Payer: Self-pay | Admitting: Surgery

## 2014-12-03 NOTE — Progress Notes (Signed)
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 When I last saw him on 05/28/2014 his CT scan showed slight enlargement of two small nodules, one in the RUL that measured 5 mm compared to 2 mm on his prior scan and the other in the superior segment of the LLL measuring 7 mm which had not been seen on the prior scan. He has returned to smoking 1 ppd after quitting for a while and using an Engineer, manufacturing. He continues to use oxygen at home.  Current Outpatient Prescriptions  Medication Sig Dispense Refill  . albuterol (PROVENTIL) (2.5 MG/3ML) 0.083% nebulizer solution Take 3 mLs (2.5 mg total) by nebulization every 4 (four) hours as needed. For shortness of breath 75 mL 10  . aspirin EC 81 MG tablet Take 81 mg by mouth daily.    Marland Kitchen atorvastatin (LIPITOR) 40 MG tablet Take 40 mg by mouth daily.    . budesonide-formoterol (SYMBICORT) 160-4.5 MCG/ACT inhaler Inhale 2 puffs into the lungs daily. 1 Inhaler 6  . Calcium Carbonate-Vitamin D (CALCIUM 600/VITAMIN D PO) Take 1 tablet by mouth daily.    . chloroquine (ARALEN) 250 MG tablet 1 tab po qd x 2 weeks then 1 tab po q week 30 tablet 1  . cyclobenzaprine (FLEXERIL) 10 MG tablet TAKE 1 TABLET (10 MG TOTAL) BY MOUTH 3 (THREE) TIMES DAILY AS NEEDED FOR MUSCLE SPASMS. 90 tablet 0  . diltiazem (CARDIZEM CD) 120 MG 24 hr capsule TAKE ONE CAPSULE BY MOUTH EVERY DAY 90 capsule 1  . FLUoxetine (PROZAC) 40 MG capsule TAKE ONE CAPSULE BY MOUTH EVERY DAY 90 capsule 1  . gabapentin (NEURONTIN) 300 MG capsule TAKE 1 CAPSULE BY MOUTH THREE TIMES DAILY 270 capsule 3  . HYDROcodone-acetaminophen (NORCO) 5-325 MG per tablet Take 1 tablet by mouth every 6 (six) hours as needed for moderate pain. 60 tablet 0  . insulin aspart (NOVOLOG) 100 UNIT/ML injection Inject 10-25 Units into the skin 3 (three) times daily  before meals. CBG < 70: implement hypoglycemia protocol CBG 70 - 120: 0 units CBG 121 - 150: 5 units CBG 151 - 200: 10 units CBG 201 - 250: 15 units CBG 251 - 300: 20 units CBG 301 - 350: 25 units CBG 351 - 400: 30 units 1 vial   . insulin glargine (LANTUS) 100 UNIT/ML injection Inject 0.8 mLs (80 Units total) into the skin every morning. 10 mL 12  . losartan (COZAAR) 25 MG tablet Take 25 mg by mouth daily.    . Magnesium 250 MG TABS Take 500 mg by mouth 2 (two) times daily.     . Multiple Vitamin (MULTIVITAMIN WITH MINERALS) TABS Take 1 tablet by mouth daily.    . nitroGLYCERIN (NITROSTAT) 0.4 MG SL tablet Place 0.4 mg under the tongue every 5 (five) minutes as needed for chest pain.    . ranitidine (ZANTAC) 150 MG capsule Take 1 capsule (150 mg total) by mouth 2 (two) times daily. 60 capsule 11  . tiotropium (SPIRIVA) 18 MCG inhalation capsule Place 1 capsule (18 mcg total) into inhaler and inhale daily. 90 capsule 3  . topiramate (TOPAMAX) 50 MG tablet Take 1 tablet (50 mg total) by mouth 2 (two) times daily. 180 tablet 2  . traZODone (DESYREL) 100 MG tablet TAKE ONE TABLET BY MOUTH EVERY NIGHT AT BEDTIME 90 tablet 1  No current facility-administered medications for this visit.     Physical Exam: BP 112/62 mmHg  Pulse 59  Resp 20  Ht 5\' 11"  (1.803 m)  Wt 268 lb (121.564 kg)  BMI 37.39 kg/m2  SpO2 96% He is in no distress on oxygen.  There is no cervical or supraclavicular adenopathy.  Lung exam is clear.  The right thoracotomy scar looks good. There are no skin lesions.  Cardiac exam shows a regular rate and rhythm with normal heart sounds.   Diagnostic Tests:  CLINICAL DATA: Lung cancer. Chest pain, cough and shortness of breath. Left-sided chest and axillary pain.  EXAM: CT CHEST WITHOUT CONTRAST  TECHNIQUE: Multidetector CT imaging of the chest was performed following the standard protocol without IV contrast.  COMPARISON: 05/26/2014, 05/06/2013 and  10/07/2012.  FINDINGS: Mediastinum/Nodes: Mediastinal lymph nodes measure up to 1.3 cm in the AP window, stable. Hilar regions are difficult to definitively evaluate without IV contrast. No axillary adenopathy. Gynecomastia. Atherosclerotic calcification of the arterial vasculature, including extensive three-vessel involvement of the coronary arteries. Heart size normal. No pericardial effusion.  Lungs/Pleura: Postoperative changes of right upper lobectomy with associated scarring and volume loss. An 8 x 9 mm nodule in the superior segment left lower lobe (series 4, image 27) is slightly increased in size from 05/26/2014, at which time it measured 5 x 7 mm. Lesion is new from 05/06/2013. Additional scattered tiny pulmonary nodules measure 4 mm or less in size and are unchanged from 05/06/2013. No pleural fluid. Airway is otherwise unremarkable.  Upper abdomen: Visualized portions of the liver, gallbladder, adrenal glands and right kidney are grossly unremarkable. Probable renal vascular calcification on the left. Visualized portions of the spleen, pancreas, stomach and bowel are grossly unremarkable.  Musculoskeletal: No worrisome lytic or sclerotic lesions. Degenerative changes are seen in the spine. Thoracotomy changes on the right.  IMPRESSION: 1. Postoperative changes of right upper lobectomy with an enlarging nodule in the left lower lobe, worrisome for recurrent primary bronchogenic carcinoma. Lesion is below the size threshold for PET resolution. 2. No acute findings to explain the patient's additional symptoms. 3. Extensive 3 vessel coronary artery calcification.   Electronically Signed  By: Lorin Picket M.D.  On: 12/01/2014 13:00  Impression:  The lesion in the LLL superior segment has continued to enlarge to 8 x 9 mm. The RUL nodule has not changed. Additional scattered tiny pulmonary nodules measuring 4 mm or less in size are unchanged. This lesion in  the LLL is slightly larger and could be a malignancy. It is too small to characterize by PET and he is not a candidate for surgery. I think it is too small to do a needle biopsy in this obese gentleman with severe COPD. The lesion has not changed much over the past 6 months so I think it is reasonable to wait another 6 months before repeating the scan.   Plan:  He will return in 6 months with a repeat CT.

## 2014-12-24 ENCOUNTER — Other Ambulatory Visit: Payer: Self-pay | Admitting: Family Medicine

## 2014-12-24 NOTE — Telephone Encounter (Signed)
Medication refilled per protocol. 

## 2014-12-31 ENCOUNTER — Other Ambulatory Visit: Payer: Self-pay | Admitting: Nurse Practitioner

## 2015-01-12 ENCOUNTER — Encounter: Payer: Self-pay | Admitting: Family Medicine

## 2015-01-13 ENCOUNTER — Telehealth: Payer: Self-pay | Admitting: Internal Medicine

## 2015-01-13 NOTE — Telephone Encounter (Signed)
Pt has not been seen since 2014. Attempted to call the number that was left and it is not for [employer].

## 2015-01-14 ENCOUNTER — Encounter: Payer: Self-pay | Admitting: Neurology

## 2015-01-14 ENCOUNTER — Ambulatory Visit (INDEPENDENT_AMBULATORY_CARE_PROVIDER_SITE_OTHER): Payer: Managed Care, Other (non HMO) | Admitting: Neurology

## 2015-01-14 VITALS — BP 135/68 | HR 95 | Ht 71.0 in | Wt 266.0 lb

## 2015-01-14 DIAGNOSIS — R42 Dizziness and giddiness: Secondary | ICD-10-CM

## 2015-01-14 DIAGNOSIS — R55 Syncope and collapse: Secondary | ICD-10-CM | POA: Diagnosis not present

## 2015-01-14 DIAGNOSIS — Z720 Tobacco use: Secondary | ICD-10-CM | POA: Diagnosis not present

## 2015-01-14 DIAGNOSIS — F17200 Nicotine dependence, unspecified, uncomplicated: Secondary | ICD-10-CM

## 2015-01-14 MED ORDER — TOPIRAMATE 50 MG PO TABS: ORAL_TABLET | ORAL | Status: DC

## 2015-01-14 NOTE — Progress Notes (Signed)
Reason for visit: Dizziness, near syncope  [name] is an 64 y.o. male  History of present illness:  [name] is a 64 year old right-handed white male with a history of episodes of dizziness. He has been placed on Topamax currently on 50 mg twice daily, with some improvement in his symptoms. He was having essentially daily episodes, but now he is having on average one episode week. The patient indicates that he can bring on the dizziness by looking up, if he looks back down quickly, he can avoid a severe episode. The patient denies any other new symptoms of numbness, weakness of the extremities. He continues to smoke, he currently is smoking one half pack of cigarettes daily. The patient has a history of lung cancer. He indicates that he has been able lose some weight, up to 30 pounds while taking the Topamax. He remains significantly obese, however. He returns for an evaluation. He is tolerating the Topamax well.  Past Medical History  Diagnosis Date  . Anemia   . Allergy   . Thyroid disease   . Parathyroid disease   . Headache(784.0)   . Hypertension     takes  HYzaar daily  . Coronary artery disease     has 1 stent  . Myocardial infarct   . Asthma   . Emphysema     sees Dr.Ramaswami for this  . COPD (chronic obstructive pulmonary disease)     uses Albuterol and Spiriva daily  . Shortness of breath     with exertion   . Bronchitis   . Productive cough     white in color but no odor  . Sleep apnea     uses BiPaP  . Peripheral neuropathy   . Lung mass     right upper lobe  . Back pain     4 deteriorating disc and receives an injection q3-82mon;has been doing this for about 72yrs  . H/O hiatal hernia   . GERD (gastroesophageal reflux disease)     takes Prilosec daily  . Blood transfusion     as a child  . Glaucoma     hx of  . Depression     takes Prozac daily  . Insomnia     takes Trazodone nightly  . Anginal pain   . Chronic kidney disease     acute  kidney failure post surgery  . Pneumonia     hx of' 64 yo,rd' last time in 2012  . Type II diabetes mellitus     takes Metformin bid and Novolog and Lantus daily  . Arthritis     "hands"  . Syncope and collapse 05/26/2013  . Obesity   . Periodic limb movements of sleep   . Cancer     Non-small cell lung cancer    Past Surgical History  Procedure Laterality Date  . Carpal tunnel release      bilateral  . Rotator cuff repair      left  . Colonoscopy    . Esophagogastroduodenoscopy    . Eye surgery    . Lobectomy  03/17/2012    upper right side with resection  . Elbow surgery      ulnar nerve; left  . Cardiac catheterization  2006/2008/2009  . Coronary angioplasty with stent placement      1 stent  . Refractive surgery      bilaterally  . Inguinal hernia repair  60's    double inguinal    Family  History  Problem Relation Age of Onset  . COPD Mother   . Diabetes Mother   . Hypertension Mother   . Heart attack Father   . Diabetes Father   . Anesthesia problems Neg Hx   . Hypotension Neg Hx   . Malignant hyperthermia Neg Hx   . Pseudochol deficiency Neg Hx     Social history:  reports that he has been smoking Cigarettes.  He has a 45 pack-year smoking history. He has never used smokeless tobacco. He reports that he drinks alcohol. He reports that he uses illicit drugs (Cocaine).    Allergies  Allergen Reactions  . Actos [Pioglitazone] Other (See Comments)    edema  . Avandia [Rosiglitazone] Other (See Comments)    edema  . Dilaudid [Hydromorphone Hcl]     Made him crazy    Medications:  Prior to Admission medications   Medication Sig Start Date End Date Taking? Authorizing Provider  albuterol (PROVENTIL) (2.5 MG/3ML) 0.083% nebulizer solution Take 3 mLs (2.5 mg total) by nebulization every 4 (four) hours as needed. For shortness of breath 03/26/14  Yes Alycia Rossetti, MD  aspirin EC 81 MG tablet Take 81 mg by mouth daily.   Yes Historical Provider, MD    atorvastatin (LIPITOR) 40 MG tablet Take 40 mg by mouth daily.   Yes Historical Provider, MD  budesonide-formoterol (SYMBICORT) 160-4.5 MCG/ACT inhaler Inhale 2 puffs into the lungs daily. 03/26/14  Yes Alycia Rossetti, MD  chloroquine (ARALEN) 250 MG tablet 1 tab po qd x 2 weeks then 1 tab po q week 01/26/14  Yes [name], MD  cyclobenzaprine (FLEXERIL) 10 MG tablet TAKE 1 TABLET (10 MG TOTAL) BY MOUTH 3 (THREE) TIMES DAILY AS NEEDED FOR MUSCLE SPASMS. 11/22/14  Yes [name], MD  diltiazem (CARDIZEM CD) 120 MG 24 hr capsule TAKE ONE CAPSULE BY MOUTH EVERY DAY 11/18/14  Yes [name], MD  FLUoxetine (PROZAC) 40 MG capsule TAKE ONE CAPSULE BY MOUTH EVERY DAY 08/16/14  Yes [name], MD  gabapentin (NEURONTIN) 300 MG capsule TAKE 1 CAPSULE BY MOUTH THREE TIMES DAILY 05/21/14  Yes Alycia Rossetti, MD  HYDROcodone-acetaminophen (NORCO) 5-325 MG per tablet Take 1 tablet by mouth every 6 (six) hours as needed for moderate pain. 10/18/14  Yes Alycia Rossetti, MD  insulin aspart (NOVOLOG) 100 UNIT/ML injection Inject 10-25 Units into the skin 3 (three) times daily before meals. CBG < 70: implement hypoglycemia protocol CBG 70 - 120: 0 units CBG 121 - 150: 5 units CBG 151 - 200: 10 units CBG 201 - 250: 15 units CBG 251 - 300: 20 units CBG 301 - 350: 25 units CBG 351 - 400: 30 units 09/29/12  Yes Janece Canterbury, MD  insulin glargine (LANTUS) 100 UNIT/ML injection Inject 0.8 mLs (80 Units total) into the skin every morning. 09/02/13  Yes Charlynne Cousins, MD  losartan (COZAAR) 25 MG tablet Take 25 mg by mouth daily.   Yes Historical Provider, MD  Magnesium 250 MG TABS Take 500 mg by mouth 2 (two) times daily.    Yes Historical Provider, MD  Multiple Vitamin (MULTIVITAMIN WITH MINERALS) TABS Take 1 tablet by mouth daily.   Yes Historical Provider, MD  nitroGLYCERIN (NITROSTAT) 0.4 MG SL tablet Place 0.4 mg under the tongue every 5 (five) minutes as needed for chest pain.    Yes Historical Provider, MD  ranitidine (ZANTAC) 150 MG capsule Take 1 capsule (150 mg total) by mouth  2 (two) times daily. 05/05/13  Yes [name], MD  tiotropium (SPIRIVA) 18 MCG inhalation capsule Place 1 capsule (18 mcg total) into inhaler and inhale daily. 04/30/13  Yes Brand Males, MD  topiramate (TOPAMAX) 50 MG tablet TAKE 1 TABLET (50 MG TOTAL) BY MOUTH 2 (TWO) TIMES DAILY. 12/31/14  Yes Kathrynn Ducking, MD  traZODone (DESYREL) 100 MG tablet TAKE ONE TABLET BY MOUTH EVERY NIGHT AT BEDTIME 12/24/14  Yes [name], MD    ROS:  Out of a complete 14 system review of symptoms, the patient complains only of the following symptoms, and all other reviewed systems are negative.  Chills, fatigue, decreased weight Ringing in the ears, runny nose, difficulty swallowing Cough, wheezing, shortness of breath, chest tightness Chest pain Cold intolerance, excessive thirst Frequency of urination Restless legs, sleep apnea Joint pain, muscle cramps, neck stiffness Skin wounds, itching Bruising easily Memory loss, dizziness, numbness, tremors, passing out Confusion, depression, anxiety, hallucinations  Blood pressure 135/68, pulse 95, height 5\' 11"  (1.803 m), weight 266 lb (120.657 kg).  Physical Exam  General: The patient is alert and cooperative at the time of the examination. The patient is markedly obese.  Skin: No significant peripheral edema is noted.   Neurologic Exam  Mental status: The patient is alert and oriented x 3 at the time of the examination. The patient has apparent normal recent and remote memory, with an apparently normal attention span and concentration ability.   Cranial nerves: Facial symmetry is present. Speech is normal, no aphasia or dysarthria is noted. Extraocular movements are full. Visual fields are full.  Motor: The patient has good strength in all 4 extremities.  Sensory examination: Soft touch sensation is symmetric on the face, arms, and  legs.  Coordination: The patient has good finger-nose-finger and heel-to-shin bilaterally.  Gait and station: The patient has a normal gait. Tandem gait is normal. Romberg is negative. No drift is seen.  Reflexes: Deep tendon reflexes are symmetric.   Assessment/Plan:  1. Episodic dizziness  2. Tobacco abuse  3. Obesity  The patient is doing some better with the dizziness, we will go up on the Topamax some. The patient appears to have some positional component to the dizziness, if the Topamax increase is not helpful, we may consider vestibular rehabilitation. He otherwise will follow-up in 6 months. The patient will begin taking 50 mg of Topamax in the morning, 100 mg in the evening.  Jill Alexanders MD 01/14/2015 4:21 PM  [facility] [address] [place],  [address]  Phone [phone] Fax [phone]

## 2015-01-14 NOTE — Patient Instructions (Signed)

## 2015-01-21 ENCOUNTER — Telehealth: Payer: Self-pay | Admitting: Internal Medicine

## 2015-01-21 NOTE — Telephone Encounter (Signed)
Called [facility] and they stated that the  Pt is wanting a refill of the nebulizer medications.  Pt has not been seen by MR since 2014.  MR please advise about the refill.  [facility] is now closed so we will have to call back on Monday for the refill.  Thanks  Allergies  Allergen Reactions  . Actos [Pioglitazone] Other (See Comments)    edema  . Avandia [Rosiglitazone] Other (See Comments)    edema  . Dilaudid [Hydromorphone Hcl]     Made him crazy    Current Outpatient Prescriptions on File Prior to Visit  Medication Sig Dispense Refill  . albuterol (PROVENTIL) (2.5 MG/3ML) 0.083% nebulizer solution Take 3 mLs (2.5 mg total) by nebulization every 4 (four) hours as needed. For shortness of breath 75 mL 10  . aspirin EC 81 MG tablet Take 81 mg by mouth daily.    Marland Kitchen atorvastatin (LIPITOR) 40 MG tablet Take 40 mg by mouth daily.    . budesonide-formoterol (SYMBICORT) 160-4.5 MCG/ACT inhaler Inhale 2 puffs into the lungs daily. 1 Inhaler 6  . chloroquine (ARALEN) 250 MG tablet 1 tab po qd x 2 weeks then 1 tab po q week 30 tablet 1  . cyclobenzaprine (FLEXERIL) 10 MG tablet TAKE 1 TABLET (10 MG TOTAL) BY MOUTH 3 (THREE) TIMES DAILY AS NEEDED FOR MUSCLE SPASMS. 90 tablet 0  . diltiazem (CARDIZEM CD) 120 MG 24 hr capsule TAKE ONE CAPSULE BY MOUTH EVERY DAY 90 capsule 1  . FLUoxetine (PROZAC) 40 MG capsule TAKE ONE CAPSULE BY MOUTH EVERY DAY 90 capsule 1  . gabapentin (NEURONTIN) 300 MG capsule TAKE 1 CAPSULE BY MOUTH THREE TIMES DAILY 270 capsule 3  . HYDROcodone-acetaminophen (NORCO) 5-325 MG per tablet Take 1 tablet by mouth every 6 (six) hours as needed for moderate pain. 60 tablet 0  . insulin aspart (NOVOLOG) 100 UNIT/ML injection Inject 10-25 Units into the skin 3 (three) times daily before meals. CBG < 70: implement hypoglycemia protocol CBG 70 - 120: 0 units CBG 121 - 150: 5 units CBG 151 - 200: 10 units CBG 201 - 250: 15 units CBG 251 - 300: 20 units CBG 301 - 350: 25 units CBG 351 - 400: 30  units 1 vial   . insulin glargine (LANTUS) 100 UNIT/ML injection Inject 0.8 mLs (80 Units total) into the skin every morning. 10 mL 12  . losartan (COZAAR) 25 MG tablet Take 25 mg by mouth daily.    . Magnesium 250 MG TABS Take 500 mg by mouth 2 (two) times daily.     . Multiple Vitamin (MULTIVITAMIN WITH MINERALS) TABS Take 1 tablet by mouth daily.    . nitroGLYCERIN (NITROSTAT) 0.4 MG SL tablet Place 0.4 mg under the tongue every 5 (five) minutes as needed for chest pain.    . ranitidine (ZANTAC) 150 MG capsule Take 1 capsule (150 mg total) by mouth 2 (two) times daily. 60 capsule 11  . tiotropium (SPIRIVA) 18 MCG inhalation capsule Place 1 capsule (18 mcg total) into inhaler and inhale daily. 90 capsule 3  . topiramate (TOPAMAX) 50 MG tablet One tablet in the morning, two tablets in the evening 270 tablet 1  . traZODone (DESYREL) 100 MG tablet TAKE ONE TABLET BY MOUTH EVERY NIGHT AT BEDTIME 90 tablet 1   No current facility-administered medications on file prior to visit.

## 2015-01-24 NOTE — Telephone Encounter (Signed)
Refills ok to do

## 2015-01-24 NOTE — Telephone Encounter (Signed)
[facility] now closed. Is it just albuterol they are requesting? WCB in AM

## 2015-01-25 NOTE — Telephone Encounter (Signed)
Attempted to call [employer]. No answer. Will try back.

## 2015-01-26 MED ORDER — ALBUTEROL SULFATE (2.5 MG/3ML) 0.083% IN NEBU: 2.5000 mg | INHALATION_SOLUTION | RESPIRATORY_TRACT | Status: DC | PRN

## 2015-01-26 NOTE — Telephone Encounter (Signed)
Called and spoke to [employer]. Rx called in. Pt aware. Nothing further needed.

## 2015-01-28 ENCOUNTER — Other Ambulatory Visit: Payer: Self-pay | Admitting: Family Medicine

## 2015-01-31 NOTE — Telephone Encounter (Signed)
?   OK to Refill - lov 10/25/14

## 2015-01-31 NOTE — Telephone Encounter (Signed)
ok 

## 2015-01-31 NOTE — Telephone Encounter (Signed)
Medication refilled per protocol. 

## 2015-01-31 NOTE — Telephone Encounter (Signed)
This encounter was created in error - please disregard.

## 2015-02-14 ENCOUNTER — Encounter: Payer: Self-pay | Admitting: Internal Medicine

## 2015-02-14 ENCOUNTER — Ambulatory Visit (INDEPENDENT_AMBULATORY_CARE_PROVIDER_SITE_OTHER): Payer: Managed Care, Other (non HMO) | Admitting: Internal Medicine

## 2015-02-14 ENCOUNTER — Encounter (INDEPENDENT_AMBULATORY_CARE_PROVIDER_SITE_OTHER): Payer: Self-pay

## 2015-02-14 VITALS — BP 132/70 | HR 111 | Ht 71.0 in | Wt 266.0 lb

## 2015-02-14 DIAGNOSIS — Z720 Tobacco use: Secondary | ICD-10-CM

## 2015-02-14 DIAGNOSIS — J431 Panlobular emphysema: Secondary | ICD-10-CM | POA: Diagnosis not present

## 2015-02-14 DIAGNOSIS — G4733 Obstructive sleep apnea (adult) (pediatric): Secondary | ICD-10-CM | POA: Diagnosis not present

## 2015-02-14 DIAGNOSIS — F17200 Nicotine dependence, unspecified, uncomplicated: Secondary | ICD-10-CM

## 2015-02-14 MED ORDER — TIOTROPIUM BROMIDE MONOHYDRATE 18 MCG IN CAPS: 18.0000 ug | ORAL_CAPSULE | Freq: Every day | RESPIRATORY_TRACT | Status: DC

## 2015-02-14 MED ORDER — BUDESONIDE-FORMOTEROL FUMARATE 160-4.5 MCG/ACT IN AERO: 2.0000 | INHALATION_SPRAY | Freq: Two times a day (BID) | RESPIRATORY_TRACT | Status: DC

## 2015-02-14 NOTE — Progress Notes (Signed)
Subjective:    Patient ID: [name], male    DOB: 13-Oct-1951, 64 y.o.   MRN: [id number]  HPI  1. tobacco abuse (wellbutrin intolerance hx - insomnia in early 2011, chantix intolerance )  - quit Nov 2012 after admission for pna - relapsed Jan/FEb 2013. Quit May 2013 after lobectomy for cancer - e-cigs as of Aug and Oct 2013 - back to regular cigs - June 2013  2A. 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 AECOPD - Nov 2012 admit for pneumonia  - Jan 2013 OPD Rx  - March 2013 - presumed opd Rx - February 2014-admission for COPD exacerbation  3. Mediastinal nodes Stable Oct 2009 - July 2011; no further fu  4.RUL pulmonary nodule with stage IA non-small cell lung cancer - May 2013  - - May 2013  - 1.9cm T1a, N0, M0  Stage 1A NSCLC with 0.3cm carcinoid - s/p lobectomy - December 2013 CT chest: No recurrence - July 2014 CT chest - no recurrence. RML nodule stabvle since Dec 2013 - JAn 2016 - no recurrence  4. LLL LB6 nodule - July 2015 - new LLL LB6 nodule - Jan 2016 - no recurrence but has LLL nodule - worrisome for cancer   5. Obesity/osa on cpap and cough  Body mass index is 42.40 kg/(m^2). on 12/21/2011 Body mass index is 39.35 kg/(m^2). on 01/14/2013 Body mass index is 38.34 kg/(m^2). on 04/30/2013 Body mass index is 38.65 kg/(m^2). pn 06/15/2013 Body mass index is 37.12 kg/(m^2). 02/14/2015    OV 02/14/2015  Chief Complaint  Patient presents with  . Follow-up    Pt last seen on 06/15/2013 by MR. Pt here for f/u and to be requalified for O2. Pt states his breathing has worsened since last OV. Pt c/o DOE, prod cough with white mucus and chest pain when active.    Follow-up  - COPD: I'm not seen  him in one and half years. COPD stable. No interim exacerbations. COPD cat score shows significant symptom burden but this is baseline. It is documented below. He is compliant with his inhalers. He is here for oxygen recertification. He does not feel he needs oxygen. In fact when we walked him 185 feet 3 laps on room air at rest he did not desaturate. He is wondering if he needs nocturnal oxygen as well.  Sleep apnea: He uses CPAP at night. He also has a BiPAP machine. Unclear which one he uses. In addition uses oxygen at night. His insurance is asking to recertify for his sleep apnea but he does not want to because of cost. He does not want to see a sleep Dr. Because of cost. He says he will be content using his CPAP at night and just returned as BiPAP. His only question is whether he would need oxygen or not and he is open to using it if he desaturates  Smoking: He continues to smoke a few cigarettes here and there:     CAT COPD Symptom & Quality of Life Score ([place]) 0 is no burden. 5 is highest burden 02/14/2015   Never Cough -> Cough all the time 3  No phlegm in chest -> Chest  is full of phlegm 4  No chest tightness -> Chest feels very tight 1  No dyspnea for 1 flight stairs/hill -> Very dyspneic for 1 flight of stairs 5  No limitations for ADL at home -> Very limited with ADL at home 2  Confident leaving home -> Not at all confident leaving home 1  Sleep soundly -> Do not sleep soundly because of lung condition 3  Lots of Energy -> No energy at all 5  TOTAL Score (max 40)  24     Past medical history Obesity: He continues to lose weight intentionally with diet remains  Lung nodule and lung cancer this is being followed by Dr. [name]. He's had emergence of a new nodule in the contralateral left lower lobe in summer 2015 and it has grown in January 2016. He is apparently having a follow-up CT scan of the chest in summer 2016    has a past medical history of Anemia; Allergy;  Thyroid disease; Parathyroid disease; Headache(784.0); Hypertension; Coronary artery disease; Myocardial infarct; Asthma; Emphysema; COPD (chronic obstructive pulmonary disease); Shortness of breath; Bronchitis; Productive cough; Sleep apnea; Peripheral neuropathy; Lung mass; Back pain; H/O hiatal hernia; GERD (gastroesophageal reflux disease); Blood transfusion; Glaucoma; Depression; Insomnia; Anginal pain; Chronic kidney disease; Pneumonia; Type II diabetes mellitus; Arthritis; Syncope and collapse (05/26/2013); Obesity; Periodic limb movements of sleep; and Cancer.   reports that he has been smoking Cigarettes.  He has a 45 pack-year smoking history. He has never used smokeless tobacco.  Past Surgical History  Procedure Laterality Date  . Carpal tunnel release      bilateral  . Rotator cuff repair      left  . Colonoscopy    . Esophagogastroduodenoscopy    . Eye surgery    . Lobectomy  03/17/2012    upper right side with resection  . Elbow surgery      ulnar nerve; left  . Cardiac catheterization  2006/2008/2009  . Coronary angioplasty with stent placement      1 stent  . Refractive surgery      bilaterally  . Inguinal hernia repair  1990's    double inguinal    Allergies  Allergen Reactions  . Actos [Pioglitazone] Other (See Comments)    edema  . Avandia [Rosiglitazone] Other (See Comments)    edema  . Dilaudid [Hydromorphone Hcl]     Made him crazy    Immunization History  Administered Date(s) Administered  . Influenza Split 08/21/2012, 08/25/2013  . Influenza Whole 06/06/2009, 07/07/2011  . Influenza,inj,Quad PF,36+ Mos 10/25/2014  . Influenza-Unspecified 08/09/2010  . Pneumococcal Conjugate-13 10/02/2013  . Pneumococcal Polysaccharide-23 11/11/2008  . Tdap 09/09/2012  . Zoster 09/09/2012    Family History  Problem Relation Age of Onset  . COPD Mother   . Diabetes Mother   . Hypertension Mother   . Heart attack Father   . Diabetes Father   . Anesthesia  problems Neg Hx   . Hypotension Neg Hx   . Malignant hyperthermia Neg Hx   . Pseudochol deficiency Neg Hx      Current outpatient prescriptions:  .  albuterol (PROVENTIL) (2.5 MG/3ML) 0.083% nebulizer solution, Take 3 mLs (2.5 mg total) by nebulization every 4 (four) hours as needed. For shortness of breath, Disp: 360 mL, Rfl: 4 .  aspirin EC 81 MG tablet, Take 81 mg by mouth daily., Disp: , Rfl:  .  atorvastatin (LIPITOR) 40 MG tablet, Take 40 mg by mouth daily., Disp: , Rfl:  .  cyclobenzaprine (FLEXERIL) 10 MG tablet, TAKE 1 TABLET (10 MG TOTAL) BY MOUTH 3 (THREE) TIMES DAILY AS NEEDED FOR MUSCLE SPASMS., Disp: 90 tablet, Rfl: 0 .  diltiazem (CARDIZEM CD) 120 MG 24 hr capsule, TAKE ONE CAPSULE BY MOUTH EVERY DAY, Disp: 90 capsule, Rfl: 1 .  FLUoxetine (PROZAC) 40 MG capsule, TAKE ONE CAPSULE BY MOUTH EVERY DAY, Disp: 90 capsule, Rfl: 1 .  gabapentin (NEURONTIN) 300 MG capsule, TAKE 1 CAPSULE BY MOUTH THREE TIMES DAILY, Disp: 270 capsule, Rfl: 3 .  HYDROcodone-acetaminophen (NORCO) 5-325 MG per tablet, Take 1 tablet by mouth every 6 (six) hours as needed for moderate pain., Disp: 60 tablet, Rfl: 0 .  insulin aspart (NOVOLOG) 100 UNIT/ML injection, Inject 10-25 Units into the skin 3 (three) times daily before meals. CBG < 70: implement hypoglycemia protocol CBG 70 - 120: 0 units CBG 121 - 150: 5 units CBG 151 - 200: 10 units CBG 201 - 250: 15 units CBG 251 - 300: 20 units CBG 301 - 350: 25 units CBG 351 - 400: 30 units, Disp: 1 vial, Rfl:  .  insulin glargine (LANTUS) 100 UNIT/ML injection, Inject 0.8 mLs (80 Units total) into the skin every morning., Disp: 10 mL, Rfl: 12 .  losartan (COZAAR) 25 MG tablet, Take 25 mg by mouth daily., Disp: , Rfl:  .  Multiple Vitamin (MULTIVITAMIN WITH MINERALS) TABS, Take 1 tablet by mouth daily., Disp: , Rfl:  .  nitroGLYCERIN (NITROSTAT) 0.4 MG SL tablet, Place 0.4 mg under the tongue every 5 (five) minutes as needed for chest pain., Disp: , Rfl:  .   ranitidine (ZANTAC) 150 MG capsule, Take 1 capsule (150 mg total) by mouth 2 (two) times daily., Disp: 60 capsule, Rfl: 11 .  topiramate (TOPAMAX) 50 MG tablet, One tablet in the morning, two tablets in the evening, Disp: 270 tablet, Rfl: 1 .  traZODone (DESYREL) 100 MG tablet, TAKE ONE TABLET BY MOUTH EVERY NIGHT AT BEDTIME, Disp: 90 tablet, Rfl: 1 .  budesonide-formoterol (SYMBICORT) 160-4.5 MCG/ACT inhaler, Inhale 2 puffs into the lungs daily. (Patient not taking: Reported on 02/14/2015), Disp: 1 Inhaler, Rfl: 6     Review of Systems  Constitutional: Negative for fever and unexpected weight change.  HENT: Negative for congestion, dental problem, ear pain, nosebleeds, postnasal drip, rhinorrhea, sinus pressure, sneezing, sore throat and trouble swallowing.   Eyes: Negative for redness and itching.  Respiratory: Positive for cough and shortness of breath. Negative for chest tightness and wheezing.   Cardiovascular: Positive for chest pain. Negative for palpitations and leg swelling.  Gastrointestinal: Negative for nausea and vomiting.  Genitourinary: Negative for dysuria.  Musculoskeletal: Negative for joint swelling.  Skin: Negative for rash.  Neurological: Negative for headaches.  Hematological: Does not bruise/bleed easily.  Psychiatric/Behavioral: Negative for dysphoric mood. The patient is not nervous/anxious.        Objective:   Physical Exam  Constitutional: He is oriented to person, place, and time. He appears well-developed and well-nourished. No distress.  obese  HENT:  Head: Normocephalic and atraumatic.  Right Ear: External ear normal.  Left Ear: External ear normal.  Mouth/Throat: Oropharynx is clear and moist. No oropharyngeal exudate.  Eyes: Conjunctivae and EOM are normal. Pupils are equal, round, and reactive to light. Right eye exhibits no discharge. Left eye exhibits no discharge. No scleral icterus.  Neck: Normal range of motion. Neck supple. No JVD present. No  tracheal deviation present. No thyromegaly present.  Cardiovascular: Normal rate, regular rhythm and intact  distal pulses.  Exam reveals no gallop and no friction rub.   No murmur heard. Pulmonary/Chest: Effort normal and breath sounds normal. No respiratory distress. He has no wheezes. He has no rales. He exhibits no tenderness.  Rt shoulder stoop  Abdominal: Soft. Bowel sounds are normal. He exhibits no distension and no mass. There is no tenderness. There is no rebound and no guarding.  Musculoskeletal: Normal range of motion. He exhibits no edema or tenderness.  Lymphadenopathy:    He has no cervical adenopathy.  Neurological: He is alert and oriented to person, place, and time. He has normal reflexes. No cranial nerve deficit. Coordination normal.  Skin: Skin is warm and dry. No rash noted. He is not diaphoretic. No erythema. No pallor.  Psychiatric: He has a normal mood and affect. His behavior is normal. Judgment and thought content normal.  Nursing note and vitals reviewed.    Filed Vitals:   02/14/15 1516  BP: 132/70  Pulse: 111  Height: 5\' 11"  (1.803 m)  Weight: 266 lb (120.657 kg)  SpO2: 95%        Assessment & Plan:     ICD-9-CM ICD-10-CM   1. Panlobular emphysema 492.8 J43.1 Pulse oximetry, overnight  2. Smoking 305.1 Z72.0   3. OSA (obstructive sleep apnea) 327.23 G47.33    #Emphysema - Stable - Continue inhalers as before - you do not need day time oxygen - test your ono on room air at sleep - we can decide if you need night o2 based on those results  #Smoking  - please quit;  I understand is very hard for you  #Sleep apnea  - given cost concerns of cost, avoid sleep study - return bipap - just use your own cpap qhs - at some point you need sleep study referral   #Followup - 60months  or  sooner as needed - CAT score at followupo  Dr. Brand Males, M.D., [facility]C.P Pulmonary and Critical Care Medicine Staff Physician [place]  Pulmonary and Critical Care Pager: [phone], If no answer or between  15:00h - 7:00h: call [phone]  02/14/2015 4:05 PM

## 2015-02-14 NOTE — Patient Instructions (Addendum)
#  Emphysema - Stable - Continue inhalers as before - you do not need day time oxygen - test your ono on room air at sleep - we can decide if you need night o2  #Smoking  - please quit;  I understand is very hard for you  #Sleep apnea  - given cost concerns of cost, avoid sleep study - return bipap - just use your own cpap qhs - at some point you need sleep study referral   #Followup - 51months  or  sooner as needed - CAT score at followupo

## 2015-02-18 ENCOUNTER — Telehealth: Payer: Self-pay | Admitting: Internal Medicine

## 2015-02-18 NOTE — Telephone Encounter (Signed)
Spoke with Arbie Cookey at [place]. Pt is having ONO done and the order did not specify whether or not to do on RA or with O2. Per MR's note >> pt to do ONO on RA. Arbie Cookey is aware. Nothing further was needed.

## 2015-02-20 ENCOUNTER — Other Ambulatory Visit: Payer: Self-pay | Admitting: Family Medicine

## 2015-02-26 DIAGNOSIS — F17200 Nicotine dependence, unspecified, uncomplicated: Secondary | ICD-10-CM | POA: Insufficient documentation

## 2015-02-26 DIAGNOSIS — J431 Panlobular emphysema: Secondary | ICD-10-CM | POA: Insufficient documentation

## 2015-03-02 ENCOUNTER — Telehealth: Payer: Self-pay | Admitting: Internal Medicine

## 2015-03-02 DIAGNOSIS — J449 Chronic obstructive pulmonary disease, unspecified: Secondary | ICD-10-CM

## 2015-03-02 NOTE — Telephone Encounter (Signed)
ONO 02/21/15 < 88% 1.1 minute - he does not need o2 at night

## 2015-03-04 NOTE — Telephone Encounter (Signed)
lmtcb for pt.  

## 2015-03-07 ENCOUNTER — Telehealth: Payer: Self-pay | Admitting: Internal Medicine

## 2015-03-07 DIAGNOSIS — G4733 Obstructive sleep apnea (adult) (pediatric): Secondary | ICD-10-CM

## 2015-03-07 DIAGNOSIS — Z9989 Dependence on other enabling machines and devices: Principal | ICD-10-CM

## 2015-03-07 NOTE — Telephone Encounter (Signed)
Pt's wife is returning call - please call her at work [phone]. She will be there until 3:15. Will be at lunch from 11:15-12:00 so will not be at her desk then.

## 2015-03-07 NOTE — Telephone Encounter (Signed)
Spoke with pt's wife. Relayed the same message to her as I did to the pt. See previous telephone encounter.

## 2015-03-07 NOTE — Telephone Encounter (Signed)
Spoke with spouse. She wants to know does this mean we can send an order to [facility] to d/c both the BIPAP and O2 then? Please advise thanks

## 2015-03-07 NOTE — Telephone Encounter (Signed)
Can dc o2 but for bipap needs to see sleep doc unless he does not want bipap either then he can self dc

## 2015-03-07 NOTE — Telephone Encounter (Signed)
Pt is aware of MR's response. Order has been sent to have [facility] d/c oxygen. Nothing further was needed.

## 2015-03-27 ENCOUNTER — Other Ambulatory Visit: Payer: Self-pay | Admitting: Family Medicine

## 2015-03-28 NOTE — Telephone Encounter (Signed)
?   OK to Refill  

## 2015-03-28 NOTE — Telephone Encounter (Signed)
ok 

## 2015-04-06 ENCOUNTER — Encounter: Payer: Self-pay | Admitting: Internal Medicine

## 2015-05-02 ENCOUNTER — Telehealth: Payer: Self-pay | Admitting: Family Medicine

## 2015-05-02 NOTE — Telephone Encounter (Signed)
Called and spoke to pt's wife and his insurance will cover Zanaflex tabs at a tier 1 level - pt wanted an appt for his cough and would like to wait until ov to discuss this medication to make sure it is safe for him to take with all his other medications. Appt made and pt aware.

## 2015-05-02 NOTE — Telephone Encounter (Signed)
Patients wife [name] calling to say that with his cyclobenzaprine, now that he is on medicare, this is a tier 2 drug, would like to know if we can call her and get something changed according to the medicare formulary please call her at [phone]

## 2015-05-04 ENCOUNTER — Encounter: Payer: Self-pay | Admitting: Physician Assistant

## 2015-05-04 ENCOUNTER — Ambulatory Visit (INDEPENDENT_AMBULATORY_CARE_PROVIDER_SITE_OTHER): Payer: PPO | Admitting: Physician Assistant

## 2015-05-04 VITALS — BP 120/86 | HR 98 | Temp 97.5°F | Resp 18 | Wt 258.0 lb

## 2015-05-04 DIAGNOSIS — J449 Chronic obstructive pulmonary disease, unspecified: Secondary | ICD-10-CM

## 2015-05-04 DIAGNOSIS — B9689 Other specified bacterial agents as the cause of diseases classified elsewhere: Principal | ICD-10-CM

## 2015-05-04 DIAGNOSIS — J988 Other specified respiratory disorders: Secondary | ICD-10-CM | POA: Diagnosis not present

## 2015-05-04 MED ORDER — AZITHROMYCIN 250 MG PO TABS: ORAL_TABLET | ORAL | Status: DC

## 2015-05-04 NOTE — Progress Notes (Signed)
Patient ID: [name] MRN: [id number], DOB: 01/17/1951, 64 y.o. Date of Encounter: 05/04/2015, 12:32 PM    Chief Complaint:  Chief Complaint  Patient presents with  . Cough    x 2 week no better, coughing up "junk"     HPI: 64 y.o. year old male presents with above symptoms. Says that for the past 2 weeks he has had a lot of congested cough productive of thick dark phlegm. Visit he is also getting thick dark mucus from his nose. No significant sore throat or earache and no fevers or chills.     Home Meds:   Outpatient Prescriptions Prior to Visit  Medication Sig Dispense Refill  . albuterol (PROVENTIL) (2.5 MG/3ML) 0.083% nebulizer solution Take 3 mLs (2.5 mg total) by nebulization every 4 (four) hours as needed. For shortness of breath 360 mL 4  . aspirin EC 81 MG tablet Take 81 mg by mouth daily.    Marland Kitchen atorvastatin (LIPITOR) 40 MG tablet Take 40 mg by mouth daily.    . budesonide-formoterol (SYMBICORT) 160-4.5 MCG/ACT inhaler Inhale 2 puffs into the lungs 2 (two) times daily. 1 Inhaler 11  . cyclobenzaprine (FLEXERIL) 10 MG tablet TAKE 1 TABLET (10 MG TOTAL) BY MOUTH 3 (THREE) TIMES DAILY AS NEEDED FOR MUSCLE SPASMS. 90 tablet 2  . diltiazem (CARDIZEM CD) 120 MG 24 hr capsule TAKE ONE CAPSULE BY MOUTH EVERY DAY 90 capsule 1  . FLUoxetine (PROZAC) 40 MG capsule TAKE ONE CAPSULE BY MOUTH EVERY DAY 90 capsule 1  . gabapentin (NEURONTIN) 300 MG capsule TAKE 1 CAPSULE BY MOUTH THREE TIMES DAILY 270 capsule 3  . HYDROcodone-acetaminophen (NORCO) 5-325 MG per tablet Take 1 tablet by mouth every 6 (six) hours as needed for moderate pain. 60 tablet 0  . insulin aspart (NOVOLOG) 100 UNIT/ML injection Inject 10-25 Units into the skin 3 (three) times daily before meals. CBG < 70: implement hypoglycemia protocol CBG 70 - 120: 0 units CBG 121 - 150: 5 units CBG 151 - 200: 10 units CBG 201 - 250: 15 units CBG 251 - 300: 20 units CBG 301 - 350: 25 units CBG 351 - 400: 30 units 1 vial    . insulin glargine (LANTUS) 100 UNIT/ML injection Inject 0.8 mLs (80 Units total) into the skin every morning. 10 mL 12  . losartan (COZAAR) 25 MG tablet Take 25 mg by mouth daily.    . Multiple Vitamin (MULTIVITAMIN WITH MINERALS) TABS Take 1 tablet by mouth daily.    . nitroGLYCERIN (NITROSTAT) 0.4 MG SL tablet Place 0.4 mg under the tongue every 5 (five) minutes as needed for chest pain.    . ranitidine (ZANTAC) 150 MG capsule Take 1 capsule (150 mg total) by mouth 2 (two) times daily. 60 capsule 11  . tiotropium (SPIRIVA HANDIHALER) 18 MCG inhalation capsule Place 1 capsule (18 mcg total) into inhaler and inhale daily. 30 capsule 11  . topiramate (TOPAMAX) 50 MG tablet One tablet in the morning, two tablets in the evening 270 tablet 1  . traZODone (DESYREL) 100 MG tablet TAKE ONE TABLET BY MOUTH EVERY NIGHT AT BEDTIME 90 tablet 1   No facility-administered medications prior to visit.    Allergies:  Allergies  Allergen Reactions  . Actos [Pioglitazone] Other (See Comments)    edema  . Avandia [Rosiglitazone] Other (See Comments)    edema  . Dilaudid [Hydromorphone Hcl]     Made him crazy      Review of Systems: See  HPI for pertinent ROS. All other ROS negative.    Physical Exam: Blood pressure 120/86, pulse 98, temperature 97.5 F (36.4 C), temperature source Oral, resp. rate 18, weight 258 lb (117.028 kg)., Body mass index is 36 kg/(m^2). General:  Overweight white male . Appears in no acute distress. HEENT: Normocephalic, atraumatic, eyes without discharge, sclera non-icteric, nares are without discharge. Bilateral auditory canals clear, TM's are without perforation, pearly grey and translucent with reflective cone of light bilaterally. Oral cavity moist, posterior pharynx without exudate, erythema, peritonsillar abscess. No tenderness with percussion of frontal or maxillary sinuses bilaterally.  Neck: Supple. No thyromegaly. No lymphadenopathy. Lungs: Breath sounds are  slightly decreased and distant. However there really are no wheezes. Heart: Regular rhythm. No murmurs, rubs, or gallops. Msk:  Strength and tone normal for age. Extremities/Skin: Warm and dry. Neuro: Alert and oriented X 3. Moves all extremities spontaneously. Gait is normal. CNII-XII grossly in tact. Psych:  Responds to questions appropriately with a normal affect.     ASSESSMENT AND PLAN:  64 y.o. year old male with  1. Bacterial respiratory infection - azithromycin (ZITHROMAX) 250 MG tablet; Day 1: Take 2 daily.  Days 2-5: Take 1 daily.  Dispense: 6 tablet; Refill: 0  taken to biotic as directed and complete all of it. Also recommend using it Mucinex DM expectorate. Follow-up if symptoms do not resolve within 1 week after completion of antibody. 2. Chronic obstructive pulmonary disease, unspecified COPD, unspecified chronic bronchitis type   Signed, [address] [place], [place], [facility] 05/04/2015 12:32 PM

## 2015-05-13 ENCOUNTER — Other Ambulatory Visit: Payer: Self-pay | Admitting: Family Medicine

## 2015-05-16 ENCOUNTER — Other Ambulatory Visit: Payer: Self-pay | Admitting: *Deleted

## 2015-05-16 DIAGNOSIS — R918 Other nonspecific abnormal finding of lung field: Secondary | ICD-10-CM

## 2015-05-24 ENCOUNTER — Telehealth: Payer: Self-pay | Admitting: *Deleted

## 2015-05-24 NOTE — Telephone Encounter (Signed)
Received fax from [facility] care mgmt with authorization to [place] with authorization [id number]  Requesting provider: Nat Math  Treating provider: [facility] [address]  Place of service: Imaging center  PCP: Jenna Luo MD  Type of service: CT  Procedures: 71250-CT thorax w/o dye  Number of visits:1  Start date: 05/24/15  End date:11/19/14  Dx:R91.8-other nonspecific abnormal finding of lung field

## 2015-06-08 ENCOUNTER — Ambulatory Visit: Payer: PPO | Admitting: Surgery

## 2015-06-08 ENCOUNTER — Ambulatory Visit
Admission: RE | Admit: 2015-06-08 | Discharge: 2015-06-08 | Disposition: A | Discharge status: Home / Self Care | Payer: PPO | Source: Ambulatory Visit | Attending: Surgery | Admitting: Surgery

## 2015-06-08 DIAGNOSIS — R918 Other nonspecific abnormal finding of lung field: Secondary | ICD-10-CM

## 2015-06-10 ENCOUNTER — Encounter: Payer: Self-pay | Admitting: Surgery

## 2015-06-10 ENCOUNTER — Ambulatory Visit (INDEPENDENT_AMBULATORY_CARE_PROVIDER_SITE_OTHER): Payer: PPO | Admitting: Surgery

## 2015-06-10 VITALS — BP 124/72 | HR 94 | Resp 20 | Ht 71.0 in | Wt 258.0 lb

## 2015-06-10 DIAGNOSIS — R918 Other nonspecific abnormal finding of lung field: Secondary | ICD-10-CM | POA: Diagnosis not present

## 2015-06-10 DIAGNOSIS — Z85118 Personal history of other malignant neoplasm of bronchus and lung: Secondary | ICD-10-CM | POA: Diagnosis not present

## 2015-06-13 ENCOUNTER — Encounter: Payer: Self-pay | Admitting: Surgery

## 2015-06-13 NOTE — Progress Notes (Signed)
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 He is currently smoking 1- 1 1/2 packs of cigarettes per day. He has been watching his diet and has lost over 50 lbs. He says that he feels well, is off oxygen and has no shortness of breath.  Current Outpatient Prescriptions  Medication Sig Dispense Refill  . albuterol (PROVENTIL) (2.5 MG/3ML) 0.083% nebulizer solution Take 3 mLs (2.5 mg total) by nebulization every 4 (four) hours as needed. For shortness of breath 360 mL 4  . aspirin EC 81 MG tablet Take 81 mg by mouth daily.    Marland Kitchen atorvastatin (LIPITOR) 40 MG tablet Take 40 mg by mouth daily.    . budesonide-formoterol (SYMBICORT) 160-4.5 MCG/ACT inhaler Inhale 2 puffs into the lungs 2 (two) times daily. 1 Inhaler 11  . diltiazem (CARDIZEM CD) 120 MG 24 hr capsule TAKE ONE CAPSULE BY MOUTH EVERY DAY 90 capsule 1  . FLUoxetine (PROZAC) 40 MG capsule TAKE ONE CAPSULE BY MOUTH EVERY DAY 90 capsule 1  . gabapentin (NEURONTIN) 300 MG capsule TAKE 1 CAPSULE BY MOUTH THREE TIMES DAILY 270 capsule 3  . HYDROcodone-acetaminophen (NORCO) 5-325 MG per tablet Take 1 tablet by mouth every 6 (six) hours as needed for moderate pain. 60 tablet 0  . insulin aspart (NOVOLOG) 100 UNIT/ML injection Inject 10-25 Units into the skin 3 (three) times daily before meals. CBG < 70: implement hypoglycemia protocol CBG 70 - 120: 0 units CBG 121 - 150: 5 units CBG 151 - 200: 10 units CBG 201 - 250: 15 units CBG 251 - 300: 20 units CBG 301 - 350: 25 units CBG 351 - 400: 30 units 1 vial   . insulin glargine (LANTUS) 100 UNIT/ML injection Inject 0.8 mLs (80 Units total) into the skin every morning. 10 mL 12  . losartan (COZAAR) 25 MG tablet Take 25 mg by mouth daily.    . Multiple Vitamin (MULTIVITAMIN WITH MINERALS) TABS Take 1 tablet by mouth daily.    .  nitroGLYCERIN (NITROSTAT) 0.4 MG SL tablet Place 0.4 mg under the tongue every 5 (five) minutes as needed for chest pain.    . ranitidine (ZANTAC) 150 MG capsule Take 1 capsule (150 mg total) by mouth 2 (two) times daily. 60 capsule 11  . tiotropium (SPIRIVA HANDIHALER) 18 MCG inhalation capsule Place 1 capsule (18 mcg total) into inhaler and inhale daily. 30 capsule 11  . topiramate (TOPAMAX) 50 MG tablet One tablet in the morning, two tablets in the evening 270 tablet 1  . traZODone (DESYREL) 100 MG tablet TAKE ONE TABLET BY MOUTH EVERY NIGHT AT BEDTIME 90 tablet 1   No current facility-administered medications for this visit.     Physical Exam: BP 124/72 mmHg  Pulse 94  Resp 20  Ht '5\' 11"'$  (1.803 m)  Wt 258 lb (117.028 kg)  BMI 36.00 kg/m2  SpO2 94% He looks good There is no cervical or supraclavicular adenopathy.  Lung exam is clear.  The right thoracotomy scar looks good. There are no skin lesions.  Cardiac exam shows a regular rate and rhythm with normal heart sounds.    Diagnostic Tests:  CLINICAL DATA: Followup lung nodules. History of right upper lobe resection. History of right lung cancer 2013.  EXAM: CT CHEST WITHOUT CONTRAST  TECHNIQUE: Multidetector CT imaging of the chest was performed  following the standard protocol without IV contrast.  COMPARISON: 12/01/2014  FINDINGS: Postoperative changes from right upper lobectomy. Previously seen enlarging nodule in the superior segment of the left lower lobe is stable, measuring 9 x 7 mm on image 31. Additional scattered pulmonary nodules 4 mm or less in size in the left lower lobe, right upper lobe and right middle lobe are all stable. No pleural effusions or confluent airspace opacities.  Heart is normal size. Aorta is normal caliber. Coronary artery calcifications in all 3 major coronary vessels. Aorta is tortuous and calcified, non aneurysmal.  AP window lymph node again measures up to 13 mm.  Right paratracheal lymph node measures up to 10 mm. These are short axis measurements. These are stable. No enlarging mediastinal lymph nodes. No axillary adenopathy.  Chest wall soft tissues are unremarkable. Imaging into the upper abdomen shows no acute findings.  No acute bony abnormality or focal bone lesion.  IMPRESSION: Previously suspicious nodule in the superior segment of the left lower lobe remains stable over the short interval, measuring 9 mm in short axis diameter. Stable scattered 4 mm or smaller nodules  Extensive 3 vessel coronary artery disease.  Stable borderline and mildly prominent mediastinal lymph nodes.   Electronically Signed  By: Rolm Baptise M.D.  On: 06/08/2015 11:36   Impression:  The nodule in the left lower lobe superior segment is 8 x 9 mm and essentially unchanged. There are no other significant changes.  Plan:  I will see him back in 6 months with a repeat CT of the chest to follow up on the LLL nodule.   [name], MD [facility] [phone]

## 2015-06-16 ENCOUNTER — Telehealth: Payer: Self-pay | Admitting: Family Medicine

## 2015-06-16 MED ORDER — TIZANIDINE HCL 4 MG PO TABS: 4.0000 mg | ORAL_TABLET | Freq: Three times a day (TID) | ORAL | Status: DC

## 2015-06-16 NOTE — Telephone Encounter (Signed)
ok 

## 2015-06-16 NOTE — Telephone Encounter (Signed)
Pt's insurance will no longer cover Flexeril and would like to switch to Zanaflex as it is tier 1  - OK to switch

## 2015-06-16 NOTE — Telephone Encounter (Signed)
[name] wife calling regarding his cyclobenzaprine and some questions she has

## 2015-06-16 NOTE — Telephone Encounter (Signed)
Med sent to pharm and pt aware 

## 2015-06-22 ENCOUNTER — Other Ambulatory Visit: Payer: Self-pay | Admitting: Family Medicine

## 2015-07-04 ENCOUNTER — Ambulatory Visit (INDEPENDENT_AMBULATORY_CARE_PROVIDER_SITE_OTHER): Payer: PPO | Admitting: Family Medicine

## 2015-07-04 ENCOUNTER — Encounter: Payer: Self-pay | Admitting: Family Medicine

## 2015-07-04 VITALS — BP 140/64 | HR 94 | Temp 98.7°F | Resp 18 | Ht 71.0 in | Wt 242.0 lb

## 2015-07-04 DIAGNOSIS — J441 Chronic obstructive pulmonary disease with (acute) exacerbation: Secondary | ICD-10-CM

## 2015-07-04 MED ORDER — PREDNISONE 20 MG PO TABS: ORAL_TABLET | ORAL | Status: DC

## 2015-07-04 MED ORDER — LEVOFLOXACIN 500 MG PO TABS
500.0000 mg | ORAL_TABLET | Freq: Every day | ORAL | Status: DC
Start: 2015-07-04 — End: 2015-08-17

## 2015-07-04 NOTE — Progress Notes (Signed)
Subjective:    Patient ID: [name], male    DOB: [birth year], 64 y.o.   MRN: [id number]  HPI   patient reports a two-week history of worsening cough productive of green and brown sputum, increasing chest congestion, worsening shortness of breath. He has a history of end-stage COPD. He continues to smoke. He denies any fevers or chills. On exam today he has markedly diminished breath sounds bilaterally with expiratory wheezes and rhonchorous breath sounds. He's been using albuterol once a day. Past Medical History  Diagnosis Date  . Anemia   . Allergy   . Thyroid disease   . Parathyroid disease   . Headache(784.0)   . Hypertension     takes  HYzaar daily  . Coronary artery disease     has 1 stent  . Myocardial infarct   . Asthma   . Emphysema     sees Dr.Ramaswami for this  . COPD (chronic obstructive pulmonary disease)     uses Albuterol and Spiriva daily  . Shortness of breath     with exertion   . Bronchitis   . Productive cough     white in color but no odor  . Sleep apnea     uses BiPaP  . Peripheral neuropathy   . Lung mass     right upper lobe  . Back pain     4 deteriorating disc and receives an injection q3-25mo;has been doing this for about 437yr . H/O hiatal hernia   . GERD (gastroesophageal reflux disease)     takes Prilosec daily  . Blood transfusion     as a child  . Glaucoma     hx of  . Depression     takes Prozac daily  . Insomnia     takes Trazodone nightly  . Anginal pain   . Chronic kidney disease     acute kidney failure post surgery  . Pneumonia     hx of' 64 yo,rd' last time in 2012  . Type II diabetes mellitus     takes Metformin bid and Novolog and Lantus daily  . Arthritis     "hands"  . Syncope and collapse 05/26/2013  . Obesity   . Periodic limb movements of sleep   . Cancer     Non-small cell lung cancer   Past Surgical History  Procedure Laterality Date  . Carpal tunnel release      bilateral  . Rotator cuff  repair      left  . Colonoscopy    . Esophagogastroduodenoscopy    . Eye surgery    . Lobectomy  03/17/2012    upper right side with resection  . Elbow surgery      ulnar nerve; left  . Cardiac catheterization  2006/2008/2009  . Coronary angioplasty with stent placement      1 stent  . Refractive surgery      bilaterally  . Inguinal hernia repair  1990's    double inguinal   Current Outpatient Prescriptions on File Prior to Visit  Medication Sig Dispense Refill  . albuterol (PROVENTIL) (2.5 MG/3ML) 0.083% nebulizer solution Take 3 mLs (2.5 mg total) by nebulization every 4 (four) hours as needed. For shortness of breath 360 mL 4  . aspirin EC 81 MG tablet Take 81 mg by mouth daily.    . Marland Kitchentorvastatin (LIPITOR) 40 MG tablet Take 40 mg by mouth daily.    . Marland Kitcheniltiazem (CARDIZEM CD) 120 MG 24 hr  capsule TAKE ONE CAPSULE BY MOUTH EVERY DAY 90 capsule 1  . FLUoxetine (PROZAC) 40 MG capsule TAKE ONE CAPSULE BY MOUTH EVERY DAY 90 capsule 1  . gabapentin (NEURONTIN) 300 MG capsule TAKE 1 CAPSULE BY MOUTH THREE TIMES DAILY 270 capsule 3  . HYDROcodone-acetaminophen (NORCO) 5-325 MG per tablet Take 1 tablet by mouth every 6 (six) hours as needed for moderate pain. 60 tablet 0  . insulin aspart (NOVOLOG) 100 UNIT/ML injection Inject 10-25 Units into the skin 3 (three) times daily before meals. CBG < 70: implement hypoglycemia protocol CBG 70 - 120: 0 units CBG 121 - 150: 5 units CBG 151 - 200: 10 units CBG 201 - 250: 15 units CBG 251 - 300: 20 units CBG 301 - 350: 25 units CBG 351 - 400: 30 units 1 vial   . insulin glargine (LANTUS) 100 UNIT/ML injection Inject 0.8 mLs (80 Units total) into the skin every morning. 10 mL 12  . losartan (COZAAR) 25 MG tablet Take 25 mg by mouth daily.    . Multiple Vitamin (MULTIVITAMIN WITH MINERALS) TABS Take 1 tablet by mouth daily.    . nitroGLYCERIN (NITROSTAT) 0.4 MG SL tablet Place 0.4 mg under the tongue every 5 (five) minutes as needed for chest pain.      . ranitidine (ZANTAC) 150 MG capsule Take 1 capsule (150 mg total) by mouth 2 (two) times daily. 60 capsule 11  . tiZANidine (ZANAFLEX) 4 MG tablet Take 1 tablet (4 mg total) by mouth 3 (three) times daily. 90 tablet 2  . topiramate (TOPAMAX) 50 MG tablet One tablet in the morning, two tablets in the evening 270 tablet 1  . traZODone (DESYREL) 100 MG tablet TAKE ONE TABLET BY MOUTH EVERY NIGHT AT BEDTIME 90 tablet 1  . budesonide-formoterol (SYMBICORT) 160-4.5 MCG/ACT inhaler Inhale 2 puffs into the lungs 2 (two) times daily. (Patient not taking: Reported on 07/04/2015) 1 Inhaler 11  . tiotropium (SPIRIVA HANDIHALER) 18 MCG inhalation capsule Place 1 capsule (18 mcg total) into inhaler and inhale daily. (Patient not taking: Reported on 07/04/2015) 30 capsule 11   No current facility-administered medications on file prior to visit.   Allergies  Allergen Reactions  . Actos [Pioglitazone] Other (See Comments)    edema  . Avandia [Rosiglitazone] Other (See Comments)    edema  . Dilaudid [Hydromorphone Hcl]     Made him crazy   Social History   Social History  . Marital Status: Married    Spouse Name: mary lou  . Number of Children: 3  . Years of Education: trade    Occupational History  . truck driver     retired   Social History Main Topics  . Smoking status: Current Every Day Smoker -- 1.00 packs/day for 45 years    Types: Cigarettes    Last Attempt to Quit: 03/26/2012  . Smokeless tobacco: Never Used  . Alcohol Use: Yes     Comment: gallon per week liquor "Haven't drink in 6 weeks"  . Drug Use: Yes    Special: Cocaine     Comment: quit 1990's  . Sexual Activity: Not Currently   Other Topics Concern  . 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       Objective:   Physical Exam  Constitutional: He appears well-developed.  Cardiovascular: Normal rate and regular rhythm.   Pulmonary/Chest: Effort normal. He has decreased breath sounds. He has wheezes. He has rhonchi.  Abdominal: Soft. Bowel sounds are normal.  Musculoskeletal: He exhibits no edema.  Vitals reviewed.         Assessment & Plan:  COPD exacerbation - Plan: levofloxacin (LEVAQUIN) 500 MG tablet, predniSONE (DELTASONE) 20 MG tablet   Patient is having a COPD exacerbation. I recommended smoking cessation. Begin a prednisone taper pack in addition to Levaquin 500 mg by mouth daily for 7 days. Also use albuterol 2 puffs inhaled every 6 hours until his breathing improves. Recheck in 48 hours if no better or sooner if worse

## 2015-07-18 ENCOUNTER — Ambulatory Visit: Payer: Managed Care, Other (non HMO) | Admitting: Nurse Practitioner

## 2015-07-20 ENCOUNTER — Ambulatory Visit (INDEPENDENT_AMBULATORY_CARE_PROVIDER_SITE_OTHER): Payer: PPO | Admitting: Nurse Practitioner

## 2015-07-20 ENCOUNTER — Encounter: Payer: Self-pay | Admitting: Nurse Practitioner

## 2015-07-20 VITALS — BP 117/66 | HR 88 | Ht 70.5 in | Wt 245.4 lb

## 2015-07-20 DIAGNOSIS — R569 Unspecified convulsions: Secondary | ICD-10-CM

## 2015-07-20 DIAGNOSIS — I951 Orthostatic hypotension: Secondary | ICD-10-CM | POA: Diagnosis not present

## 2015-07-20 DIAGNOSIS — R55 Syncope and collapse: Secondary | ICD-10-CM

## 2015-07-20 MED ORDER — TOPIRAMATE 50 MG PO TABS: 100.0000 mg | ORAL_TABLET | Freq: Two times a day (BID) | ORAL | Status: DC

## 2015-07-20 NOTE — Progress Notes (Signed)
[facility]  PATIENT: [name] DOB: 01-Feb-1951   REASON FOR VISIT: Follow-up for episodes of dizziness, orthostatic hypotension HISTORY FROM: Patient and wife    HISTORY OF PRESENT ILLNESS: Mr. Cress, 64 year old male returns for follow-up. He has a history of episodes of dizziness that just last several seconds. He is currently on Topamax with improvement in his symptoms. He is continuing to lose weight and additional 20 pounds since last seen and he is no longer using his CPAP. His blood pressure lying seated and standing indicate some orthostasis. His water intake is not good. According to the wife he has had 2-3 episodes since July where he has almost fallen due to his dizziness episodes. He denies any numbness, weakness of the extremities. He returns for reevaluation   HISTORY KWMr. Earnest is a 64 year old right-handed white male with a history of episodes of dizziness. He has been placed on Topamax currently on 50 mg twice daily, with some improvement in his symptoms. He was having essentially daily episodes, but now he is having on average one episode week. The patient indicates that he can bring on the dizziness by looking up, if he looks back down quickly, he can avoid a severe episode. The patient denies any other new symptoms of numbness, weakness of the extremities. He continues to smoke, he currently is smoking one half pack of cigarettes daily. The patient has a history of lung cancer. He indicates that he has been able lose some weight, up to 30 pounds while taking the Topamax. He remains significantly obese, however. He returns for an evaluation. He is tolerating the Topamax well.   REVIEW OF SYSTEMS: Full 14 system review of systems performed and notable only for those listed, all others are neg:  Constitutional: neg  Cardiovascular: neg Ear/Nose/Throat: Runny nose Skin: neg Eyes: neg Respiratory: Wheezing Gastroitestinal: Urinary  frequency Hematology/Lymphatic: neg  Endocrine: Intolerance to cold Musculoskeletal: Back pain Allergy/Immunology: neg Neurological: Seizure, dizziness Psychiatric: Depression Sleep : Restless leg   ALLERGIES: Allergies  Allergen Reactions  . Actos [Pioglitazone] Other (See Comments)    edema  . Avandia [Rosiglitazone] Other (See Comments)    edema  . Dilaudid [Hydromorphone Hcl]     Made him crazy    HOME MEDICATIONS: Outpatient Prescriptions Prior to Visit  Medication Sig Dispense Refill  . albuterol (PROVENTIL) (2.5 MG/3ML) 0.083% nebulizer solution Take 3 mLs (2.5 mg total) by nebulization every 4 (four) hours as needed. For shortness of breath 360 mL 4  . aspirin EC 81 MG tablet Take 81 mg by mouth daily.    Marland Kitchen atorvastatin (LIPITOR) 40 MG tablet Take 40 mg by mouth daily.    Marland Kitchen diltiazem (CARDIZEM CD) 120 MG 24 hr capsule TAKE ONE CAPSULE BY MOUTH EVERY DAY 90 capsule 1  . FLUoxetine (PROZAC) 40 MG capsule TAKE ONE CAPSULE BY MOUTH EVERY DAY 90 capsule 1  . gabapentin (NEURONTIN) 300 MG capsule TAKE 1 CAPSULE BY MOUTH THREE TIMES DAILY 270 capsule 3  . HYDROcodone-acetaminophen (NORCO) 5-325 MG per tablet Take 1 tablet by mouth every 6 (six) hours as needed for moderate pain. 60 tablet 0  . insulin aspart (NOVOLOG) 100 UNIT/ML injection Inject 10-25 Units into the skin 3 (three) times daily before meals. CBG < 70: implement hypoglycemia protocol CBG 70 - 120: 0 units CBG 121 - 150: 5 units CBG 151 - 200: 10 units CBG 201 - 250: 15 units CBG 251 - 300: 20 units CBG 301 - 350: 25  units CBG 351 - 400: 30 units 1 vial   . insulin glargine (LANTUS) 100 UNIT/ML injection Inject 0.8 mLs (80 Units total) into the skin every morning. 10 mL 12  . levofloxacin (LEVAQUIN) 500 MG tablet Take 1 tablet (500 mg total) by mouth daily. 7 tablet 0  . losartan (COZAAR) 25 MG tablet Take 25 mg by mouth daily.    . Multiple Vitamin (MULTIVITAMIN WITH MINERALS) TABS Take 1 tablet by mouth  daily.    . nitroGLYCERIN (NITROSTAT) 0.4 MG SL tablet Place 0.4 mg under the tongue every 5 (five) minutes as needed for chest pain.    . ranitidine (ZANTAC) 150 MG capsule Take 1 capsule (150 mg total) by mouth 2 (two) times daily. 60 capsule 11  . tiZANidine (ZANAFLEX) 4 MG tablet Take 1 tablet (4 mg total) by mouth 3 (three) times daily. 90 tablet 2  . topiramate (TOPAMAX) 50 MG tablet One tablet in the morning, two tablets in the evening 270 tablet 1  . traZODone (DESYREL) 100 MG tablet TAKE ONE TABLET BY MOUTH EVERY NIGHT AT BEDTIME 90 tablet 1  . budesonide-formoterol (SYMBICORT) 160-4.5 MCG/ACT inhaler Inhale 2 puffs into the lungs 2 (two) times daily. (Patient not taking: Reported on 07/20/2015) 1 Inhaler 11  . predniSONE (DELTASONE) 20 MG tablet 3 tabs poqday 1-2, 2 tabs poqday 3-4, 1 tab poqday 5-6 (Patient not taking: Reported on 07/20/2015) 12 tablet 0  . tiotropium (SPIRIVA HANDIHALER) 18 MCG inhalation capsule Place 1 capsule (18 mcg total) into inhaler and inhale daily. (Patient not taking: Reported on 07/20/2015) 30 capsule 11   No facility-administered medications prior to visit.    PAST MEDICAL HISTORY: Past Medical History  Diagnosis Date  . Anemia   . Allergy   . Thyroid disease   . Parathyroid disease   . Headache(784.0)   . Hypertension     takes  HYzaar daily  . Coronary artery disease     has 1 stent  . Myocardial infarct   . Asthma   . Emphysema     sees Dr.Ramaswami for this  . COPD (chronic obstructive pulmonary disease)     uses Albuterol and Spiriva daily  . Shortness of breath     with exertion   . Bronchitis   . Productive cough     white in color but no odor  . Sleep apnea     uses BiPaP  . Peripheral neuropathy   . Lung mass     right upper lobe  . Back pain     4 deteriorating disc and receives an injection q3-39mo;has been doing this for about 424yr . H/O hiatal hernia   . GERD (gastroesophageal reflux disease)     takes Prilosec daily  .  Blood transfusion     as a child  . Glaucoma     hx of  . Depression     takes Prozac daily  . Insomnia     takes Trazodone nightly  . Anginal pain   . Chronic kidney disease     acute kidney failure post surgery  . Pneumonia     hx of' 64 yo,rd' last time in 2012  . Type II diabetes mellitus     takes Metformin bid and Novolog and Lantus daily  . Arthritis     "hands"  . Syncope and collapse 05/26/2013  . Obesity   . Periodic limb movements of sleep   . Cancer     Non-small cell  lung cancer    PAST SURGICAL HISTORY: Past Surgical History  Procedure Laterality Date  . Carpal tunnel release      bilateral  . Rotator cuff repair      left  . Colonoscopy    . Esophagogastroduodenoscopy    . Eye surgery    . Lobectomy  03/17/2012    upper right side with resection  . Elbow surgery      ulnar nerve; left  . Cardiac catheterization  2006/2008/2009  . Coronary angioplasty with stent placement      1 stent  . Refractive surgery      bilaterally  . Inguinal hernia repair  88's    double inguinal    FAMILY HISTORY: Family History  Problem Relation Age of Onset  . COPD Mother   . Diabetes Mother   . Hypertension Mother   . Heart attack Father   . Diabetes Father   . Anesthesia problems Neg Hx   . Hypotension Neg Hx   . Malignant hyperthermia Neg Hx   . Pseudochol deficiency Neg Hx     SOCIAL HISTORY: Social History   Social History  . Marital Status: Married    Spouse Name: mary lou  . Number of Children: 3  . Years of Education: trade    Occupational History  . truck driver     retired   Social History Main Topics  . Smoking status: Current Every Day Smoker -- 1.00 packs/day for 45 years    Types: Cigarettes    Last Attempt to Quit: 03/26/2012  . Smokeless tobacco: Never Used  . Alcohol Use: Yes     Comment: gallon per week liquor "Haven't drink in 6 weeks"  . Drug Use: Yes    Special: Cocaine     Comment: quit 1990's  . Sexual Activity: Not  Currently   Other Topics Concern  . 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 General: The patient is alert and cooperative at the time of the examination. The patient is markedly obese. Skin: No significant peripheral edema is noted, mild bruising of the forearms. Neurologic Exam Mental status: The patient is alert and oriented x 3 at the time of the examination. The patient has apparent normal recent and remote memory, with an apparently normal attention span and concentration ability. Cranial nerves: Facial symmetry is present. Speech is normal, no aphasia or dysarthria is noted. Extraocular movements are full. Visual fields are full. Motor: The patient has good strength in all 4 extremities. No focal weakness Sensory examination: Soft touch sensation is symmetric on the face, arms, and legs. Coordination: The patient has good finger-nose-finger and heel-to-shin bilaterally. Gait and station: The patient has a normal gait. Tandem gait is normal. Romberg is negative. No drift is seen. Reflexes: Deep tendon reflexes are symmetric.   DIAGNOSTIC DATA (LABS, IMAGING, TESTING) -   EEG in the past has been normal in the waking and drowsy state.   ASSESSMENT AND PLAN  64 y.o. year  old male  has a past medical history of  episodic dizziness, orthostatic hypotension, and obesity. His water intake is poor.B/P lying 130/70 seated 120/70 standing 116/60   Increase fluid intake to 6-8 glasses of water daily Increase Topamax 100 mg twice daily new RX to pharmacy Follow-up in 6 months Keep a record of her episodes of  dizziness, [name], [facility], [facility], [facility] [address], [place], [place] [address] [phone]

## 2015-07-20 NOTE — Patient Instructions (Signed)
B/P lying 130/70 seated 120/70 standing 116/60 Increased fluid intake to 6-8 glasses of water daily Increase Topamax 100 mg twice daily new RX to pharmacy Follow-up in 6 months

## 2015-07-20 NOTE — Progress Notes (Signed)
I have read the note, and I agree with the clinical assessment and plan.  Ledell Codrington KEITH   

## 2015-08-17 ENCOUNTER — Encounter: Payer: Self-pay | Admitting: Internal Medicine

## 2015-08-17 ENCOUNTER — Ambulatory Visit (INDEPENDENT_AMBULATORY_CARE_PROVIDER_SITE_OTHER): Payer: PPO | Admitting: Internal Medicine

## 2015-08-17 VITALS — BP 120/60 | HR 89 | Ht 70.0 in | Wt 240.8 lb

## 2015-08-17 DIAGNOSIS — F17200 Nicotine dependence, unspecified, uncomplicated: Secondary | ICD-10-CM

## 2015-08-17 DIAGNOSIS — Z720 Tobacco use: Secondary | ICD-10-CM | POA: Diagnosis not present

## 2015-08-17 DIAGNOSIS — J441 Chronic obstructive pulmonary disease with (acute) exacerbation: Secondary | ICD-10-CM

## 2015-08-17 MED ORDER — IPRATROPIUM-ALBUTEROL 0.5-2.5 (3) MG/3ML IN SOLN
3.0000 mL | Freq: Four times a day (QID) | RESPIRATORY_TRACT | Status: DC
Start: 2015-08-17 — End: 2016-09-05

## 2015-08-17 MED ORDER — PREDNISONE 10 MG PO TABS
ORAL_TABLET | ORAL | Status: DC
Start: 2015-08-17 — End: 2015-09-19

## 2015-08-17 MED ORDER — FLUTICASONE PROPIONATE HFA 110 MCG/ACT IN AERO: 2.0000 | INHALATION_SPRAY | Freq: Two times a day (BID) | RESPIRATORY_TRACT | Status: DC

## 2015-08-17 MED ORDER — CEPHALEXIN 500 MG PO CAPS: 500.0000 mg | ORAL_CAPSULE | Freq: Three times a day (TID) | ORAL | Status: DC

## 2015-08-17 NOTE — Progress Notes (Signed)
Subjective:    Patient ID: [name], male    DOB: 1951-05-13, 64 y.o.   MRN: [id number]  HPI    1. tobacco abuse (wellbutrin intolerance hx - insomnia in early 2011, chantix intolerance )  - quit Nov 2012 after admission for pna - relapsed Jan/FEb 2013. Quit May 2013 after lobectomy for cancer - e-cigs as of Aug and Oct 2013 - back to regular cigs - June 2013  2A. 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 AECOPD - Nov 2012 admit for pneumonia  - Jan 2013 OPD Rx  - March 2013 - presumed opd Rx - February 2014-admission for COPD exacerbation  3. Mediastinal nodes Stable Oct 2009 - July 2011; no further fu  4.RUL pulmonary nodule with stage IA non-small cell lung cancer - May 2013  - - May 2013  - 1.9cm T1a, N0, M0  Stage 1A NSCLC with 0.3cm carcinoid - s/p lobectomy - December 2013 CT chest: No recurrence - July 2014 CT chest - no recurrence. RML nodule stabvle since Dec 2013 - JAn 2016 - no recurrence  4. LLL LB6 nodule - July 2015 - new LLL LB6 nodule - Jan 2016 - no recurrence but has LLL nodule - worrisome for cancer   5. Obesity/osa on cpap and cough  Body mass index is 42.40 kg/(m^2). on 12/21/2011 Body mass index is 39.35 kg/(m^2). on 01/14/2013 Body mass index is 38.34 kg/(m^2). on 04/30/2013 Body mass index is 38.65 kg/(m^2). pn 06/15/2013 Body mass index is 37.12 kg/(m^2). 02/14/2015 Body mass index is 34.55 kg/(m^2). 08/17/2015    OV 02/14/2015  Chief Complaint  Patient presents with  . Follow-up    Pt last seen on 06/15/2013 by MR. Pt here for f/u and to be requalified for O2. Pt states his breathing has worsened since last OV. Pt c/o DOE, prod cough with white mucus and chest pain  when active.    Follow-up  - COPD: I'm not seen him in one and half years. COPD stable. No interim exacerbations. COPD cat score shows significant symptom burden but this is baseline. It is documented below. He is compliant with his inhalers. He is here for oxygen recertification. He does not feel he needs oxygen. In fact when we walked him 185 feet 3 laps on room air at rest he did not desaturate. He is wondering if he needs nocturnal oxygen as well.  Sleep apnea: He uses CPAP at night. He also has a BiPAP machine. Unclear which one he uses. In addition uses oxygen at night. His insurance is asking to recertify for his sleep apnea but he does not want to because of cost. He does not want to see a sleep Dr. Because of cost. He says he will be content using his CPAP at night and just returned as BiPAP. His only question is whether he would need oxygen or not and he is open to using it if he desaturates  Smoking: He continues to smoke a few cigarettes here and there:   Past medical history Obesity: He continues to lose weight intentionally with diet remains  Lung nodule and lung cancer this is being followed by Dr. [name]. He's had emergence  of a new nodule in the contralateral left lower lobe in summer 2015 and it has grown in January 2016. He is apparently having a follow-up CT scan of the chest in summer 2016  OV 08/17/2015  Chief Complaint  Patient presents with  . Follow-up    pt following for panlobular  emphysema / COPD. pt states he is doing welll, breathing is good. pt states he was taken off of O2 as long ashe doesnt go too far he is doing ok with out it. pt c/o alot of chest pain and cngestion. pt states he has a prod cough with a lot of mucous mostly white some green and at times with a little bit of blood.      Follow-up COPDGold stage II pre-lobectomy. Post lobectomy severity unknown. Last seen April 2016. He presents with his wife. Both give a history. He continues to smoke. He  acknowledges that he is "hardheaded" and will never quit. In addition he is run out of his inhalers. He says he's had several exacerbations which are due to running out of inhalers. Reports that inhalers expensive. Never has had a discussion with me about cutting cost. Says that if I don't give him a flu shot today he might never have it because he is very stubborn to go to her drugstore. On the other hand he is reporting worsening symptoms for the last few weeks despite being treated with antibiotics and prednisone recently several weeks ago. Current worsening is due to lack of inhaler therapy. This worsening cough, congestion, wheezing, chest tightness. Symptoms are moderate intensity.  He is also reporting a small boil in the right side of the thigh  Off note, 06/08/2015 he had CT scan of the chest which I personally visualized image. There is no recurrence of cancer. He has a small nodule that is stable  Allergies  Allergen Reactions  . Actos [Pioglitazone] Other (See Comments)    edema  . Avandia [Rosiglitazone] Other (See Comments)    edema  . Dilaudid [Hydromorphone Hcl]     Made him crazy     CAT COPD Symptom & Quality of Life Score ([place]) 0 is no burden. 5 is highest burden 02/14/2015   Never Cough -> Cough all the time 3  No phlegm in chest -> Chest is full of phlegm 4  No chest tightness -> Chest feels very tight 1  No dyspnea for 1 flight stairs/hill -> Very dyspneic for 1 flight of stairs 5  No limitations for ADL at home -> Very limited with ADL at home 2  Confident leaving home -> Not at all confident leaving home 1  Sleep soundly -> Do not sleep soundly because of lung condition 3  Lots of Energy -> No energy at all 5  TOTAL Score (max 40)  24        Immunization History  Administered Date(s) Administered  . Influenza Split 08/21/2012, 08/25/2013  . Influenza Whole 06/06/2009, 07/07/2011  . Influenza,inj,Quad PF,36+ Mos 10/25/2014  .  Influenza-Unspecified 08/09/2010  . Pneumococcal Conjugate-13 10/02/2013  . Pneumococcal Polysaccharide-23 11/11/2008  . Tdap 09/09/2012  . Zoster 09/09/2012     Current outpatient prescriptions:  .  albuterol (PROVENTIL) (2.5 MG/3ML) 0.083% nebulizer solution, Take 3 mLs (2.5 mg total) by nebulization every 4 (four) hours as needed. For shortness of breath, Disp: 360 mL, Rfl: 4 .  aspirin EC 81 MG tablet, Take 81 mg by mouth daily., Disp: , Rfl:  .  atorvastatin (LIPITOR) 40  MG tablet, Take 40 mg by mouth daily., Disp: , Rfl:  .  diltiazem (CARDIZEM CD) 120 MG 24 hr capsule, TAKE ONE CAPSULE BY MOUTH EVERY DAY, Disp: 90 capsule, Rfl: 1 .  FLUoxetine (PROZAC) 40 MG capsule, TAKE ONE CAPSULE BY MOUTH EVERY DAY, Disp: 90 capsule, Rfl: 1 .  gabapentin (NEURONTIN) 300 MG capsule, TAKE 1 CAPSULE BY MOUTH THREE TIMES DAILY, Disp: 270 capsule, Rfl: 3 .  HYDROcodone-acetaminophen (NORCO) 5-325 MG per tablet, Take 1 tablet by mouth every 6 (six) hours as needed for moderate pain., Disp: 60 tablet, Rfl: 0 .  insulin aspart (NOVOLOG) 100 UNIT/ML injection, Inject 10-25 Units into the skin 3 (three) times daily before meals. CBG < 70: implement hypoglycemia protocol CBG 70 - 120: 0 units CBG 121 - 150: 5 units CBG 151 - 200: 10 units CBG 201 - 250: 15 units CBG 251 - 300: 20 units CBG 301 - 350: 25 units CBG 351 - 400: 30 units, Disp: 1 vial, Rfl:  .  insulin glargine (LANTUS) 100 UNIT/ML injection, Inject 0.8 mLs (80 Units total) into the skin every morning. (Patient taking differently: Inject 35 Units into the skin every morning. ), Disp: 10 mL, Rfl: 12 .  losartan (COZAAR) 25 MG tablet, Take 25 mg by mouth daily., Disp: , Rfl:  .  Multiple Vitamin (MULTIVITAMIN WITH MINERALS) TABS, Take 1 tablet by mouth daily., Disp: , Rfl:  .  nitroGLYCERIN (NITROSTAT) 0.4 MG SL tablet, Place 0.4 mg under the tongue every 5 (five) minutes as needed for chest pain., Disp: , Rfl:  .  ranitidine (ZANTAC) 150 MG capsule,  Take 1 capsule (150 mg total) by mouth 2 (two) times daily., Disp: 60 capsule, Rfl: 11 .  tiZANidine (ZANAFLEX) 4 MG tablet, Take 1 tablet (4 mg total) by mouth 3 (three) times daily. (Patient taking differently: Take 4 mg by mouth 4 (four) times daily. ), Disp: 90 tablet, Rfl: 2 .  topiramate (TOPAMAX) 50 MG tablet, Take 2 tablets (100 mg total) by mouth 2 (two) times daily., Disp: 360 tablet, Rfl: 1 .  traZODone (DESYREL) 100 MG tablet, TAKE ONE TABLET BY MOUTH EVERY NIGHT AT BEDTIME, Disp: 90 tablet, Rfl: 1    Review of Systems According to the history of present illness    Objective:   Physical Exam  Constitutional: He is oriented to person, place, and time. He appears well-developed and well-nourished. No distress.  Body mass index is 34.55 kg/(m^2). Smells of tobacco  HENT:  Head: Normocephalic and atraumatic.  Right Ear: External ear normal.  Left Ear: External ear normal.  Mouth/Throat: Oropharynx is clear and moist. No oropharyngeal exudate.  Eyes: Conjunctivae and EOM are normal. Pupils are equal, round, and reactive to light. Right eye exhibits no discharge. Left eye exhibits no discharge. No scleral icterus.  Neck: Normal range of motion. Neck supple. No JVD present. No tracheal deviation present. No thyromegaly present.  Cardiovascular: Normal rate, regular rhythm and intact distal pulses.  Exam reveals no gallop and no friction rub.   No murmur heard. Pulmonary/Chest: Effort normal. No respiratory distress. He has wheezes. He has no rales. He exhibits no tenderness.  Scattered occasional wheezing  Abdominal: Soft. Bowel sounds are normal. He exhibits no distension and no mass. There is no tenderness. There is no rebound and no guarding.  Musculoskeletal: Normal range of motion. He exhibits no edema or tenderness.  Lymphadenopathy:    He has no cervical adenopathy.  Neurological: He is alert and oriented  to person, place, and time. He has normal reflexes. No cranial nerve  deficit. Coordination normal.  Skin: Skin is warm and dry. No rash noted. He is not diaphoretic. No erythema. No pallor.  Small boil in the posterior aspect of the right thigh self-contained for the last few days  Psychiatric: He has a normal mood and affect. His behavior is normal. Judgment and thought content normal.  Nursing note and vitals reviewed.   Filed Vitals:   08/17/15 1621  BP: 120/60  Pulse: 89  Height: '5\' 10"'$  (1.778 m)  Weight: 240 lb 12.8 oz (109.226 kg)  SpO2: 96%         Assessment & Plan:     ICD-9-CM ICD-10-CM   1. COPD exacerbation ([place]) 491.21 J44.1 Ambulatory Referral for DME     ipratropium-albuterol (DUONEB) 0.5-2.5 (3) MG/3ML SOLN     cephALEXin (KEFLEX) 500 MG capsule     predniSONE (DELTASONE) 10 MG tablet  2. Smoking 305.1 Z72.0    Due to weather change and running out of inhalers due to cost issues   Plan - TO cut costs  - start duoneb 4times daily via neb (via [place]  - start Flovent 164mg 2puff twice daily   - Rx copd flare up  - Cephalexin '500mg'$  three times daily x 5 days (monitor your boil in right thigh)  - Take prednisone 40 mg daily x 2 days, then '20mg'$  daily x 2 days, then '10mg'$  daily x 2 days, then '5mg'$  daily x 2 days and stop   - STop smoking - flu shopt in 1 week - 2 weeks when better   Followup  6 months or sooner   Dr. MBrand Males M.D., FCape Coral HospitalC.P Pulmonary and Critical Care Medicine Staff Physician CYellow MedicinePulmonary and Critical Care Pager: [phone] If no answer or between  15:00h - 7:00h: call [phone]  08/17/2015 6:06 PM

## 2015-08-17 NOTE — Patient Instructions (Addendum)
ICD-9-CM ICD-10-CM   1. COPD exacerbation ([place]) 491.21 J44.1 Ambulatory Referral for DME     ipratropium-albuterol (DUONEB) 0.5-2.5 (3) MG/3ML SOLN     cephALEXin (KEFLEX) 500 MG capsule     predniSONE (DELTASONE) 10 MG tablet  2. Smoking 305.1 Z72.0    Due to weather change and running out of inhalers due to cost issues   Plan - TO cut costs  - start duoneb 4times daily via neb (via [place]  - start Flovent 161mg 2puff twice daily   - Rx copd flare up  - Cephalexin '500mg'$  three times daily x 5 days (monitor your boil in right thigh)  - Take prednisone 40 mg daily x 2 days, then '20mg'$  daily x 2 days, then '10mg'$  daily x 2 days, then '5mg'$  daily x 2 days and stop   - STop smoking - flu shopt in 1 week - 2 weeks when better   Followup  6 months or sooner

## 2015-08-18 ENCOUNTER — Other Ambulatory Visit: Payer: Self-pay | Admitting: Family Medicine

## 2015-08-18 NOTE — Telephone Encounter (Signed)
Refill appropriate and filled per protocol. 

## 2015-09-18 DIAGNOSIS — I2510 Atherosclerotic heart disease of native coronary artery without angina pectoris: Secondary | ICD-10-CM | POA: Insufficient documentation

## 2015-09-19 ENCOUNTER — Ambulatory Visit (INDEPENDENT_AMBULATORY_CARE_PROVIDER_SITE_OTHER): Payer: PPO | Admitting: Interventional Cardiology

## 2015-09-19 ENCOUNTER — Encounter: Payer: Self-pay | Admitting: Interventional Cardiology

## 2015-09-19 VITALS — BP 136/70 | HR 83 | Ht 70.5 in | Wt 235.4 lb

## 2015-09-19 DIAGNOSIS — E1159 Type 2 diabetes mellitus with other circulatory complications: Secondary | ICD-10-CM | POA: Diagnosis not present

## 2015-09-19 DIAGNOSIS — I2510 Atherosclerotic heart disease of native coronary artery without angina pectoris: Secondary | ICD-10-CM | POA: Diagnosis not present

## 2015-09-19 DIAGNOSIS — R0789 Other chest pain: Secondary | ICD-10-CM

## 2015-09-19 DIAGNOSIS — I10 Essential (primary) hypertension: Secondary | ICD-10-CM | POA: Diagnosis not present

## 2015-09-19 DIAGNOSIS — R55 Syncope and collapse: Secondary | ICD-10-CM

## 2015-09-19 DIAGNOSIS — J449 Chronic obstructive pulmonary disease, unspecified: Secondary | ICD-10-CM

## 2015-09-19 DIAGNOSIS — C3491 Malignant neoplasm of unspecified part of right bronchus or lung: Secondary | ICD-10-CM

## 2015-09-19 MED ORDER — DILTIAZEM HCL ER COATED BEADS 120 MG PO CP24: 120.0000 mg | ORAL_CAPSULE | Freq: Every day | ORAL | Status: DC

## 2015-09-19 NOTE — Patient Instructions (Signed)

## 2015-09-19 NOTE — Progress Notes (Signed)
Cardiology Office Note   Date:  09/19/2015   ID:  [name], DOB Feb 06, 1951, MRN [id number]  PCP:  Odette Fraction, MD  Cardiologist:  Sinclair Grooms, MD   Chief Complaint  Patient presents with  . Coronary Artery Disease      History of Present Illness: [name] is a 64 y.o. male who presents for CAD with RCA DES (remote), lung cancer status post partial right lung resection, hypertension, tobacco abuse, COPD, hyperlipidemia, and essential hypertension.  [name] is doing okay. He has not had angina. He continues to smoke heavily despite a diagnosis of non-small cell lung cancer with prior partial right lung resection. He now has new disease in the left lung is being followed by Dr. [name].  He denies hemoptysis.  He has had weight loss but appetite is good.  He denies claudication, syncope, and orthopnea.  The patient has not been seen by cardiology and over reamed half years. Extensive chart review was required to come up to date on the patient's current medical issues.  Past Medical History  Diagnosis Date  . Anemia   . Allergy   . Thyroid disease   . 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 Headache(784.0)   . Hypertension     takes  HYzaar daily  . Coronary artery disease     has 1 stent  . Myocardial infarct ([place])   . Asthma   . Emphysema     sees Dr.Ramaswami for this  . COPD (chronic obstructive pulmonary disease) (HCC)     uses Albuterol and Spiriva daily  . Shortness of breath     with exertion   . Bronchitis   . Productive cough     white in color but no odor  . Sleep apnea     uses BiPaP  . Peripheral neuropathy ([place])   . Lung mass     right upper lobe  . Back pain     4 deteriorating disc and receives an injection q3-29mo;has been doing this for about 473yr . H/O hiatal hernia   . GERD (gastroesophageal reflux disease)     takes Prilosec daily  . Blood transfusion     as a child  . Glaucoma     hx of  . Depression    takes Prozac daily  . Insomnia     takes Trazodone nightly  . Anginal pain ([place]  . Chronic kidney disease     acute kidney failure post surgery  . Pneumonia     hx of' 64 yo,rd' last time in 2012  . Type II diabetes mellitus (HCC)     takes Metformin bid and Novolog and Lantus daily  . Arthritis     "hands"  . Syncope and collapse 05/26/2013  . Obesity   . Periodic limb movements of sleep   . Cancer ([place]    Non-small cell lung cancer    Past Surgical History  Procedure Laterality Date  . Carpal tunnel release      bilateral  . Rotator cuff repair      left  . Colonoscopy    . Esophagogastroduodenoscopy    . Eye surgery    . Lobectomy  03/17/2012    upper right side with resection  . Elbow surgery      ulnar nerve; left  . Cardiac catheterization  2006/2008/2009  . Coronary angioplasty with stent placement      1 stent  . Refractive surgery  bilaterally  . Inguinal hernia repair  1990's    double inguinal     Current Outpatient Prescriptions  Medication Sig Dispense Refill  . albuterol (PROVENTIL) (2.5 MG/3ML) 0.083% nebulizer solution Take 3 mLs (2.5 mg total) by nebulization every 4 (four) hours as needed. For shortness of breath 360 mL 4  . aspirin EC 81 MG tablet Take 81 mg by mouth daily.    Marland Kitchen atorvastatin (LIPITOR) 40 MG tablet Take 40 mg by mouth daily.    Marland Kitchen FLUoxetine (PROZAC) 40 MG capsule TAKE ONE CAPSULE BY MOUTH EVERY DAY 90 capsule 1  . gabapentin (NEURONTIN) 300 MG capsule TAKE 1 CAPSULE BY MOUTH THREE TIMES DAILY 270 capsule 3  . HYDROcodone-acetaminophen (NORCO) 5-325 MG per tablet Take 1 tablet by mouth every 6 (six) hours as needed for moderate pain. 60 tablet 0  . insulin aspart (NOVOLOG) 100 UNIT/ML injection Check blood glucose three (3) times daily before meals. CBG < 70: implement hypoglycemia protocol CBG 70 -120: 0 UNITS CBG 121 - 150: 5 UNITS CBG 151 - 200: 10 UNITS CBG 201 - 250: 15 UNITS CBG 251 -300: 20 UNITS CBG 301 - 350: 25  UNITS CBG 351 - 400: 30 UNITS    . ipratropium-albuterol (DUONEB) 0.5-2.5 (3) MG/3ML SOLN Take 3 mLs by nebulization 4 (four) times daily. 360 mL 11  . LANTUS 100 UNIT/ML injection Inject 30 Units into the skin every morning.  3  . losartan (COZAAR) 25 MG tablet Take 25 mg by mouth daily.    . Multiple Vitamin (MULTIVITAMIN WITH MINERALS) TABS Take 1 tablet by mouth daily.    . nitroGLYCERIN (NITROSTAT) 0.4 MG SL tablet Place 0.4 mg under the tongue every 5 (five) minutes as needed for chest pain.    . ranitidine (ZANTAC) 150 MG capsule Take 1 capsule (150 mg total) by mouth 2 (two) times daily. 60 capsule 11  . tiZANidine (ZANAFLEX) 4 MG tablet Take 8 mg by mouth 2 (two) times daily.    Marland Kitchen topiramate (TOPAMAX) 50 MG tablet Take 2 tablets (100 mg total) by mouth 2 (two) times daily. 360 tablet 1  . traZODone (DESYREL) 100 MG tablet TAKE ONE TABLET BY MOUTH EVERY NIGHT AT BEDTIME 90 tablet 1   No current facility-administered medications for this visit.    Allergies:   Actos; Avandia; and Dilaudid    Social History:  The patient  reports that he has been smoking Cigarettes.  He has a 45 pack-year smoking history. He has never used smokeless tobacco. He reports that he drinks alcohol. He reports that he uses illicit drugs (Cocaine).   Family History:  The patient's family history includes COPD in his mother; Diabetes in his father and mother; Heart attack in his father; Hypertension in his mother. There is no history of Anesthesia problems, Hypotension, Malignant hyperthermia, or Pseudochol deficiency.    ROS:  Please see the history of present illness.   Otherwise, review of systems are positive for change in appetite, cough, abdominal pain, depression, diarrhea, back discomfort, dizziness, easy bruising, snoring, wheezing, and difficulty with balance..   All other systems are reviewed and negative.    PHYSICAL EXAM: VS:  BP 136/70 mmHg  Pulse 83  Ht 5' 10.5" (1.791 m)  Wt 106.777 kg (235  lb 6.4 oz)  BMI 33.29 kg/m2 , BMI Body mass index is 33.29 kg/(m^2). GEN: Well nourished, well developed, in no acute distress. Smells heavily of cigarette smoke. HEENT: normal Neck: no JVD, carotid bruits, or  masses Cardiac: RRR.  There is no murmur, rub, or gallop. There is no edema. Respiratory:  clear to auscultation bilaterally, normal work of breathing. GI: soft, nontender, nondistended, + BS MS: no deformity or atrophy Skin: warm and dry, no rash Neuro:  Strength and sensation are intact Psych: euthymic mood, full affect   EKG:  EKG is ordered today. The ekg reveals normal sinus rhythm with nonspecific T-wave flattening   Recent Labs: No results found for requested labs within last 365 days.    Lipid Panel    Component Value Date/Time   CHOL 164 09/01/2013 0521   TRIG 263* 09/01/2013 0521   HDL 30* 09/01/2013 0521   CHOLHDL 5.5 09/01/2013 0521   VLDL 53* 09/01/2013 0521   LDLCALC 81 09/01/2013 0521      Wt Readings from Last 3 Encounters:  09/19/15 106.777 kg (235 lb 6.4 oz)  08/17/15 109.226 kg (240 lb 12.8 oz)  07/20/15 111.313 kg (245 lb 6.4 oz)      Other studies Reviewed: Additional studies/ records that were reviewed today include: Thoracic surgical notes. The findings include review of recent notes from thoracic surgery. New lung cancer is being followed in the left lung.  Extensive chart review of data over the past 3-1/2 years was necessary.  ASSESSMENT AND PLAN:  1. Coronary artery disease involving native coronary artery of native heart without angina pectoris Asymptomatic - EKG 12-Lead  2. Type 2 diabetes mellitus with other circulatory complication (HCC) Followed by primary care  3. Chest pain, atypical Nonischemic in nature  4. Essential hypertension Excellent control  5. Chronic obstructive pulmonary disease, unspecified COPD type ([place]) Not evaluated  6. Non-small cell carcinoma of lung, stage 1, right ([place]) Urged to discontinue  cigarettes smoking  7. Neurocardiogenic syncope No recurrences    Current medicines are reviewed at length with the patient today.  The patient has the following concerns regarding medicines: There is concern about the tear of diltiazem..  The following changes/actions have been instituted:    Will switch to a tier 1 diltiazem alternative. We will need to speak with the pharmacy to determine which prescription into T is required  Labs/ tests ordered today include:  No orders of the defined types were placed in this encounter.     Disposition:   FU with HS in 1 year  Signed, Sinclair Grooms, MD  09/19/2015 9:07 AM    [address], [place], [place]  [address] Phone: [phone]; Fax: [phone]

## 2015-09-26 ENCOUNTER — Encounter: Payer: Self-pay | Admitting: Interventional Cardiology

## 2015-10-18 ENCOUNTER — Other Ambulatory Visit: Payer: Self-pay | Admitting: Family Medicine

## 2015-10-18 NOTE — Telephone Encounter (Signed)
Medication refilled per protocol. 

## 2015-11-29 ENCOUNTER — Other Ambulatory Visit: Payer: Self-pay | Admitting: Surgery

## 2015-11-29 DIAGNOSIS — C3490 Malignant neoplasm of unspecified part of unspecified bronchus or lung: Secondary | ICD-10-CM

## 2015-12-02 DIAGNOSIS — Z794 Long term (current) use of insulin: Secondary | ICD-10-CM | POA: Diagnosis not present

## 2015-12-02 DIAGNOSIS — E1142 Type 2 diabetes mellitus with diabetic polyneuropathy: Secondary | ICD-10-CM | POA: Diagnosis not present

## 2015-12-07 DIAGNOSIS — Z794 Long term (current) use of insulin: Secondary | ICD-10-CM | POA: Diagnosis not present

## 2015-12-07 DIAGNOSIS — E1142 Type 2 diabetes mellitus with diabetic polyneuropathy: Secondary | ICD-10-CM | POA: Diagnosis not present

## 2015-12-07 DIAGNOSIS — F329 Major depressive disorder, single episode, unspecified: Secondary | ICD-10-CM | POA: Diagnosis not present

## 2015-12-07 DIAGNOSIS — E1165 Type 2 diabetes mellitus with hyperglycemia: Secondary | ICD-10-CM | POA: Diagnosis not present

## 2015-12-16 ENCOUNTER — Other Ambulatory Visit: Payer: Self-pay | Admitting: Family Medicine

## 2015-12-19 ENCOUNTER — Encounter: Payer: Self-pay | Admitting: Podiatry

## 2015-12-19 ENCOUNTER — Ambulatory Visit (INDEPENDENT_AMBULATORY_CARE_PROVIDER_SITE_OTHER): Payer: PPO | Admitting: Podiatry

## 2015-12-19 VITALS — BP 109/60 | HR 86 | Resp 12

## 2015-12-19 DIAGNOSIS — E1151 Type 2 diabetes mellitus with diabetic peripheral angiopathy without gangrene: Secondary | ICD-10-CM | POA: Diagnosis not present

## 2015-12-19 DIAGNOSIS — B351 Tinea unguium: Secondary | ICD-10-CM | POA: Diagnosis not present

## 2015-12-19 DIAGNOSIS — L03032 Cellulitis of left toe: Secondary | ICD-10-CM | POA: Diagnosis not present

## 2015-12-19 DIAGNOSIS — M79606 Pain in leg, unspecified: Secondary | ICD-10-CM | POA: Diagnosis not present

## 2015-12-19 DIAGNOSIS — L02612 Cutaneous abscess of left foot: Secondary | ICD-10-CM

## 2015-12-19 MED ORDER — CEPHALEXIN 500 MG PO CAPS: 500.0000 mg | ORAL_CAPSULE | Freq: Two times a day (BID) | ORAL | Status: DC

## 2015-12-19 NOTE — Progress Notes (Signed)
   Subjective:    Patient ID: [name], male    DOB: [birth year], 65 y.o.   MRN: [id number]  HPI PT STATED PULLED GREAT TOENAIL AND STILL HAVE DRAINAGE, REDNESS, AND SWELLING FOR 2 MONTHS. TOE IS LOOKING WORSE WHEN PUTTING PRESSURE ON IT. TRIED PEROXIDE-NO RELIEF.   Review of Systems  Constitutional: Positive for unexpected weight change.  Respiratory: Positive for cough.   Musculoskeletal: Positive for myalgias.  Neurological: Positive for dizziness.  Hematological: Bruises/bleeds easily.       Objective:   Physical Exam        Assessment & Plan:

## 2015-12-19 NOTE — Progress Notes (Signed)
Subjective:     Patient ID: [name], male   DOB: 01/07/1951, 65 y.o.   MRN: [id number]  HPI patient states he lost his big toenail left and he knows he should've been here a lot earlier as he is having long-term diabetes thick nails that he cannot cut himself. Also has a history of diabetic shoes and states he needs new diabetic shoes as they're several years old   Review of Systems  All other systems reviewed and are negative.      Objective:   Physical Exam  Constitutional: He is oriented to person, place, and time.  Musculoskeletal: Normal range of motion.  Neurological: He is oriented to person, place, and time.  Skin: Skin is warm and dry.  Nursing note and vitals reviewed.  neurovascular status found to be diminished both sharp [employer] vibratory and PT and DP pulses. Patient is noted to have slight reduction range of motion first MPJ bilateral and is noted to have nail disease 1 through 5 right 2 through 5 left with the hallux nail bed exposed with redness on the dorsal surface but no proximal edema erythema or drainage noted     Assessment:      at risk diabetic with history of ulceration and loss of toenail left    Plan:      H&P and diabetic education rendered to patient. Spent a great of time reviewing the nailbed left and explained soaks and antibiotic treatment and I abraded the nails 1 through 5 right and 2 through 5 left. Also scheduled for diabetic shoes due to his at risk factors and history of ulceration and other issues. Patient will get approval and will be scheduled to have diabetic shoe measurement made

## 2015-12-23 DIAGNOSIS — H40033 Anatomical narrow angle, bilateral: Secondary | ICD-10-CM | POA: Diagnosis not present

## 2015-12-23 DIAGNOSIS — H524 Presbyopia: Secondary | ICD-10-CM | POA: Diagnosis not present

## 2015-12-23 DIAGNOSIS — E119 Type 2 diabetes mellitus without complications: Secondary | ICD-10-CM | POA: Diagnosis not present

## 2015-12-23 DIAGNOSIS — H2513 Age-related nuclear cataract, bilateral: Secondary | ICD-10-CM | POA: Diagnosis not present

## 2015-12-28 ENCOUNTER — Ambulatory Visit
Admission: RE | Admit: 2015-12-28 | Discharge: 2015-12-28 | Disposition: A | Discharge status: Home / Self Care | Payer: PPO | Source: Ambulatory Visit | Attending: Surgery | Admitting: Surgery

## 2015-12-28 ENCOUNTER — Encounter: Payer: Self-pay | Admitting: Surgery

## 2015-12-28 ENCOUNTER — Ambulatory Visit (INDEPENDENT_AMBULATORY_CARE_PROVIDER_SITE_OTHER): Payer: PPO | Admitting: Surgery

## 2015-12-28 VITALS — BP 115/64 | HR 88 | Resp 16 | Ht 70.5 in | Wt 225.0 lb

## 2015-12-28 DIAGNOSIS — Z85118 Personal history of other malignant neoplasm of bronchus and lung: Secondary | ICD-10-CM | POA: Diagnosis not present

## 2015-12-28 DIAGNOSIS — R911 Solitary pulmonary nodule: Secondary | ICD-10-CM | POA: Diagnosis not present

## 2015-12-28 DIAGNOSIS — C3411 Malignant neoplasm of upper lobe, right bronchus or lung: Secondary | ICD-10-CM | POA: Diagnosis not present

## 2015-12-28 DIAGNOSIS — Z902 Acquired absence of lung [part of]: Secondary | ICD-10-CM | POA: Diagnosis not present

## 2015-12-28 DIAGNOSIS — R918 Other nonspecific abnormal finding of lung field: Secondary | ICD-10-CM

## 2015-12-28 DIAGNOSIS — C3490 Malignant neoplasm of unspecified part of unspecified bronchus or lung: Secondary | ICD-10-CM

## 2015-12-28 NOTE — Progress Notes (Signed)
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 He is currently smoking 2 packs of cigarettes per day. He has been watching his diet and has lost over 70 lbs since surgery in 2013. He says that he feels well overall but has a lot of mucous to cough up in the mornings.  Current Outpatient Prescriptions  Medication Sig Dispense Refill  . albuterol (PROVENTIL) (2.5 MG/3ML) 0.083% nebulizer solution Take 3 mLs (2.5 mg total) by nebulization every 4 (four) hours as needed. For shortness of breath 360 mL 4  . aspirin EC 81 MG tablet Take 81 mg by mouth daily.    Marland Kitchen atorvastatin (LIPITOR) 40 MG tablet Take 40 mg by mouth daily.    . cephALEXin (KEFLEX) 500 MG capsule Take 1 capsule (500 mg total) by mouth 2 (two) times daily. 20 capsule 1  . diltiazem (CARDIZEM CD) 120 MG 24 hr capsule Take 1 capsule (120 mg total) by mouth daily. 90 capsule 3  . FLUoxetine (PROZAC) 40 MG capsule TAKE ONE CAPSULE BY MOUTH EVERY DAY 90 capsule 1  . gabapentin (NEURONTIN) 300 MG capsule TAKE 1 CAPSULE BY MOUTH THREE TIMES DAILY 270 capsule 1  . HYDROcodone-acetaminophen (NORCO) 5-325 MG per tablet Take 1 tablet by mouth every 6 (six) hours as needed for moderate pain. 60 tablet 0  . insulin aspart (NOVOLOG) 100 UNIT/ML injection Check blood glucose three (3) times daily before meals. CBG < 70: implement hypoglycemia protocol CBG 70 -120: 0 UNITS CBG 121 - 150: 5 UNITS CBG 151 - 200: 10 UNITS CBG 201 - 250: 15 UNITS CBG 251 -300: 20 UNITS CBG 301 - 350: 25 UNITS CBG 351 - 400: 30 UNITS    . ipratropium-albuterol (DUONEB) 0.5-2.5 (3) MG/3ML SOLN Take 3 mLs by nebulization 4 (four) times daily. 360 mL 11  . LANTUS 100 UNIT/ML injection Inject 30 Units into the skin every morning.  3  . losartan (COZAAR) 25 MG tablet Take 25 mg by mouth daily.    . Multiple  Vitamin (MULTIVITAMIN WITH MINERALS) TABS Take 1 tablet by mouth daily.    . nitroGLYCERIN (NITROSTAT) 0.4 MG SL tablet Place 0.4 mg under the tongue every 5 (five) minutes as needed for chest pain.    . ranitidine (ZANTAC) 150 MG capsule Take 1 capsule (150 mg total) by mouth 2 (two) times daily. 60 capsule 11  . tiZANidine (ZANAFLEX) 4 MG tablet Take 8 mg by mouth 2 (two) times daily.    Marland Kitchen topiramate (TOPAMAX) 50 MG tablet Take 2 tablets (100 mg total) by mouth 2 (two) times daily. 360 tablet 1  . traZODone (DESYREL) 100 MG tablet TAKE ONE TABLET BY MOUTH EVERY NIGHT AT BEDTIME 90 tablet 1   No current facility-administered medications for this visit.     Physical Exam:  BP 115/64 mmHg  Pulse 88  Resp 16  Ht 5' 10.5" (1.791 m)  Wt 225 lb (102.059 kg)  BMI 31.82 kg/m2  SpO2 93% He looks well There is no cervical or supraclavicular adenopathy.  Lung exam is clear.  The right thoracotomy scar looks good. There are no skin lesions.  Cardiac exam shows a regular rate and rhythm with normal heart sounds.   Diagnostic Tests:  CLINICAL DATA: Right upper lobe lung cancer status post resection in 2013  EXAM: CT CHEST WITHOUT  CONTRAST  TECHNIQUE: Multidetector CT imaging of the chest was performed following the standard protocol without IV contrast.  COMPARISON: 06/08/2015  FINDINGS: Mediastinum/Nodes: Heart is normal size. No pericardial effusion.  Three vessel coronary atherosclerosis.  Atherosclerotic calcifications aortic arch.  Small mediastinal lymph nodes, including a 10 mm short axis right paratracheal node (series 3/ image 22), unchanged.  Visualized thyroid is unremarkable.  Lungs/Pleura: Postsurgical changes related to right upper lobe wedge resection.  7 x 8 mm left lower lobe pulmonary nodule (series 4/image 30), previously 7 x 9 mm, grossly unchanged.  Additional 5 x 4 mm nodule in the anterior right upper lobe (series 4/ image 13),  unchanged. 3 mm nodule in the right middle lobe (series 4/image 35).  No focal consolidation. Mild scarring/ atelectasis at the left lung base (series 4/ image 54).  No pleural effusion or pneumothorax.  Upper abdomen: Visualized upper abdomen is notable for vascular calcifications.  Musculoskeletal: Degenerative changes of the visualized thoracolumbar spine.  IMPRESSION: Status post right upper lobe wedge resection.  Stable 7 x 8 mm left lower lobe pulmonary nodule and 5 x 4 mm right upper lobe pulmonary nodule.  No findings specific for recurrent or metastatic disease.   Electronically Signed  By: Julian Hy M.D.  On: 12/28/2015 11:40   Impression:  The nodule in the left lower lobe superior segment is 7 x 8 mm and essentially unchanged. There are no other significant changes. I talked to him again about the importance of smoking cessation and he says that he knows that he has to quit but wants to do it in his own time. He is not interested in any smoking cessation programs.  Plan:  I will see him back in 6 months with a repeat CT of the chest to follow up on the LLL nodule.  [name], MD [facility] [phone]

## 2016-01-16 ENCOUNTER — Ambulatory Visit (INDEPENDENT_AMBULATORY_CARE_PROVIDER_SITE_OTHER): Payer: PPO | Admitting: Physician Assistant

## 2016-01-16 ENCOUNTER — Encounter: Payer: Self-pay | Admitting: Physician Assistant

## 2016-01-16 VITALS — BP 128/68 | HR 84 | Temp 98.4°F | Resp 20 | Wt 216.0 lb

## 2016-01-16 DIAGNOSIS — J449 Chronic obstructive pulmonary disease, unspecified: Secondary | ICD-10-CM

## 2016-01-16 DIAGNOSIS — J441 Chronic obstructive pulmonary disease with (acute) exacerbation: Secondary | ICD-10-CM

## 2016-01-16 DIAGNOSIS — B9689 Other specified bacterial agents as the cause of diseases classified elsewhere: Secondary | ICD-10-CM

## 2016-01-16 DIAGNOSIS — J988 Other specified respiratory disorders: Secondary | ICD-10-CM | POA: Diagnosis not present

## 2016-01-16 MED ORDER — AZITHROMYCIN 250 MG PO TABS: ORAL_TABLET | ORAL | Status: DC

## 2016-01-16 MED ORDER — PREDNISONE 20 MG PO TABS: 20.0000 mg | ORAL_TABLET | Freq: Every day | ORAL | Status: DC

## 2016-01-16 NOTE — Progress Notes (Signed)
Patient ID: [name] MRN: [id number], DOB: 12/15/1950, 65 y.o. Date of Encounter: 01/16/2016, 3:43 PM    Chief Complaint:  Chief Complaint  Patient presents with  . sick x 1 month    cold, cough, congestion  affecting COPD,  has used 2 nebs in last 2 hrs     HPI: 65 y.o. year old white male presents with above.   He reports that above history is accurate. Also reports that for the past 1-2 weeks he has had to use his albuterol 4-5 times per day. Says for the past month he has had to increase the use.  Reviewed that the last COPD exacerbation I see in epic was 08/17/15 and he says that that was the last COPD exacerbation. Says he hasn't been having any problems with it lately until this episode.     Home Meds:   Outpatient Prescriptions Prior to Visit  Medication Sig Dispense Refill  . albuterol (PROVENTIL) (2.5 MG/3ML) 0.083% nebulizer solution Take 3 mLs (2.5 mg total) by nebulization every 4 (four) hours as needed. For shortness of breath 360 mL 4  . aspirin EC 81 MG tablet Take 81 mg by mouth daily.    Marland Kitchen atorvastatin (LIPITOR) 40 MG tablet Take 40 mg by mouth daily.    Marland Kitchen diltiazem (CARDIZEM CD) 120 MG 24 hr capsule Take 1 capsule (120 mg total) by mouth daily. 90 capsule 3  . FLUoxetine (PROZAC) 40 MG capsule TAKE ONE CAPSULE BY MOUTH EVERY DAY 90 capsule 1  . gabapentin (NEURONTIN) 300 MG capsule TAKE 1 CAPSULE BY MOUTH THREE TIMES DAILY 270 capsule 1  . insulin aspart (NOVOLOG) 100 UNIT/ML injection Check blood glucose three (3) times daily before meals. CBG < 70: implement hypoglycemia protocol CBG 70 -120: 0 UNITS CBG 121 - 150: 5 UNITS CBG 151 - 200: 10 UNITS CBG 201 - 250: 15 UNITS CBG 251 -300: 20 UNITS CBG 301 - 350: 25 UNITS CBG 351 - 400: 30 UNITS    . ipratropium-albuterol (DUONEB) 0.5-2.5 (3) MG/3ML SOLN Take 3 mLs by nebulization 4 (four) times daily. 360 mL 11  . LANTUS 100 UNIT/ML injection Inject 30 Units into the skin every morning.  3  .  losartan (COZAAR) 25 MG tablet Take 25 mg by mouth daily.    . Multiple Vitamin (MULTIVITAMIN WITH MINERALS) TABS Take 1 tablet by mouth daily.    . nitroGLYCERIN (NITROSTAT) 0.4 MG SL tablet Place 0.4 mg under the tongue every 5 (five) minutes as needed for chest pain.    . ranitidine (ZANTAC) 150 MG capsule Take 1 capsule (150 mg total) by mouth 2 (two) times daily. 60 capsule 11  . tiZANidine (ZANAFLEX) 4 MG tablet Take 8 mg by mouth 2 (two) times daily.    Marland Kitchen topiramate (TOPAMAX) 50 MG tablet Take 2 tablets (100 mg total) by mouth 2 (two) times daily. 360 tablet 1  . traZODone (DESYREL) 100 MG tablet TAKE ONE TABLET BY MOUTH EVERY NIGHT AT BEDTIME 90 tablet 1  . HYDROcodone-acetaminophen (NORCO) 5-325 MG per tablet Take 1 tablet by mouth every 6 (six) hours as needed for moderate pain. (Patient not taking: Reported on 01/16/2016) 60 tablet 0  . cephALEXin (KEFLEX) 500 MG capsule Take 1 capsule (500 mg total) by mouth 2 (two) times daily. 20 capsule 1   No facility-administered medications prior to visit.    Allergies:  Allergies  Allergen Reactions  . Actos [Pioglitazone] Other (See Comments)    edema  .  Avandia [Rosiglitazone] Other (See Comments)    edema  . Dilaudid [Hydromorphone Hcl]     Made him crazy      Review of Systems: See HPI for pertinent ROS. All other ROS negative.    Physical Exam: Blood pressure 128/68, pulse 84, temperature 98.4 F (36.9 C), temperature source Oral, resp. rate 20, weight 216 lb (97.977 kg), SpO2 94 %., Body mass index is 30.54 kg/(m^2). General:  WM. Appears in no acute distress. HEENT: Normocephalic, atraumatic, eyes without discharge, sclera non-icteric, nares are without discharge. Bilateral auditory canals clear, TM's are without perforation, pearly grey and translucent with reflective cone of light bilaterally. Oral cavity moist, posterior pharynx without exudate, erythema, peritonsillar abscess. No tenderness with percussion to frontal or  maxillary sinuses bilaterally.  Neck: Supple. No thyromegaly. No lymphadenopathy. Lungs: Has slight wheezes scattered throughout bilaterally. However there is good air movement and breath sounds. Oxygen saturation 94% on room air. Heart: Regular rhythm. No murmurs, rubs, or gallops. Msk:  Strength and tone normal for age. Extremities/Skin: Warm and dry.  Neuro: Alert and oriented X 3. Moves all extremities spontaneously. Gait is normal. CNII-XII grossly in tact. Psych:  Responds to questions appropriately with a normal affect.     ASSESSMENT AND PLAN:  65 y.o. year old male with  1. Bacterial respiratory infection - azithromycin (ZITHROMAX) 250 MG tablet; Day 1: Take 2 daily. Days 2-5: Take 1 daily.  Dispense: 6 tablet; Refill: 0  2. COPD exacerbation (HCC) - predniSONE (DELTASONE) 20 MG tablet; Take 1 tablet (20 mg total) by mouth daily with breakfast.  Dispense: 5 tablet; Refill: 0  3. Chronic obstructive pulmonary disease, unspecified COPD type ([place])  He is to take the azithromycin as directed. Take the prednisone as directed with just 1 pill daily for 5 days. Continue albuterol nebulizer and DuoNeb as directed. Follow up if symptoms worsen or do not return to normal baseline upon completion of these medications.   [address] [place], [place], [facility] 01/16/2016 3:43 PM

## 2016-01-17 ENCOUNTER — Ambulatory Visit: Payer: PPO | Admitting: Nurse Practitioner

## 2016-01-18 ENCOUNTER — Encounter: Payer: Self-pay | Admitting: Nurse Practitioner

## 2016-02-08 DIAGNOSIS — Z86010 Personal history of colonic polyps: Secondary | ICD-10-CM | POA: Diagnosis not present

## 2016-02-08 DIAGNOSIS — K921 Melena: Secondary | ICD-10-CM | POA: Diagnosis not present

## 2016-02-08 DIAGNOSIS — R1084 Generalized abdominal pain: Secondary | ICD-10-CM | POA: Diagnosis not present

## 2016-02-17 ENCOUNTER — Other Ambulatory Visit: Payer: Self-pay | Admitting: Family Medicine

## 2016-02-17 NOTE — Telephone Encounter (Signed)
Refill appropriate and filled per protocol. 

## 2016-02-20 ENCOUNTER — Other Ambulatory Visit: Payer: Self-pay | Admitting: Gastroenterology

## 2016-02-20 DIAGNOSIS — Z1211 Encounter for screening for malignant neoplasm of colon: Secondary | ICD-10-CM | POA: Diagnosis not present

## 2016-02-20 DIAGNOSIS — Z86010 Personal history of colonic polyps: Secondary | ICD-10-CM | POA: Diagnosis not present

## 2016-02-20 DIAGNOSIS — K5730 Diverticulosis of large intestine without perforation or abscess without bleeding: Secondary | ICD-10-CM | POA: Diagnosis not present

## 2016-02-20 DIAGNOSIS — D125 Benign neoplasm of sigmoid colon: Secondary | ICD-10-CM | POA: Diagnosis not present

## 2016-02-20 DIAGNOSIS — D123 Benign neoplasm of transverse colon: Secondary | ICD-10-CM | POA: Diagnosis not present

## 2016-02-20 DIAGNOSIS — D124 Benign neoplasm of descending colon: Secondary | ICD-10-CM | POA: Diagnosis not present

## 2016-02-20 DIAGNOSIS — D126 Benign neoplasm of colon, unspecified: Secondary | ICD-10-CM | POA: Diagnosis not present

## 2016-02-23 ENCOUNTER — Ambulatory Visit (INDEPENDENT_AMBULATORY_CARE_PROVIDER_SITE_OTHER): Payer: PPO

## 2016-02-23 ENCOUNTER — Ambulatory Visit (INDEPENDENT_AMBULATORY_CARE_PROVIDER_SITE_OTHER): Payer: PPO | Admitting: Podiatry

## 2016-02-23 ENCOUNTER — Encounter: Payer: Self-pay | Admitting: Podiatry

## 2016-02-23 VITALS — BP 139/62 | HR 85 | Temp 96.5°F | Resp 16

## 2016-02-23 DIAGNOSIS — M79675 Pain in left toe(s): Secondary | ICD-10-CM | POA: Diagnosis not present

## 2016-02-23 DIAGNOSIS — L03032 Cellulitis of left toe: Secondary | ICD-10-CM | POA: Diagnosis not present

## 2016-02-23 DIAGNOSIS — L02612 Cutaneous abscess of left foot: Secondary | ICD-10-CM

## 2016-02-23 DIAGNOSIS — G8929 Other chronic pain: Secondary | ICD-10-CM

## 2016-02-23 MED ORDER — CEPHALEXIN 500 MG PO CAPS
500.0000 mg | ORAL_CAPSULE | Freq: Three times a day (TID) | ORAL | Status: DC
Start: 2016-02-23 — End: 2016-07-04

## 2016-02-23 NOTE — Progress Notes (Signed)
Subjective:     Patient ID: [name], male   DOB: 10-14-1951, 65 y.o.   MRN: [id number]  HPI patient presents stating the day before yesterday I started to get drainage of my left big toe and it has been sore and it's swollen and I do of diabetes and have not been under good control and also I do smoke at least half pack per day   Review of Systems     Objective:   Physical Exam Vascular status is diminished currently with diminished hair growth and patient is noted to have a traumatized left hallux distal portion where there is no nail and there is drainage noted with no bone exposure noted currently. I did not note any proximal edema erythema or drainage at the current time and he has no systemic signs of infection    Assessment:     Localized infection left big toe that may involve bone and soft tissue    Plan:     H&P and x-ray reviewed with patient. I went ahead today and I cleaned the area out I did culture it and I placed him in a surgical shoe as there is no pressure and I'm sending him for vascular analysis. I placed him on cephalexin 500 mg 3 times a day and I did discuss there is a very good chance she is can the loose his big toe is a did note what appears to be changes on the distal portion of the left hallux bone structure  X-ray report indicates there is some absorption of the distal portion of the hallux which we'll need to be watched and may ultimately force [place] to do amputation

## 2016-02-26 LAB — WOUND CULTURE
Gram Stain: NONE SEEN
Gram Stain: NONE SEEN
Gram Stain: NONE SEEN

## 2016-02-27 ENCOUNTER — Other Ambulatory Visit: Payer: Self-pay | Admitting: Podiatry

## 2016-02-27 DIAGNOSIS — L03032 Cellulitis of left toe: Secondary | ICD-10-CM

## 2016-02-27 DIAGNOSIS — G8929 Other chronic pain: Secondary | ICD-10-CM

## 2016-02-27 DIAGNOSIS — L02612 Cutaneous abscess of left foot: Secondary | ICD-10-CM

## 2016-02-27 DIAGNOSIS — M79675 Pain in left toe(s): Principal | ICD-10-CM

## 2016-03-05 ENCOUNTER — Telehealth: Payer: Self-pay | Admitting: *Deleted

## 2016-03-05 NOTE — Telephone Encounter (Signed)
Pt's wife, Stasia Cavalier states pt's toe is still red, swollen but the pus has decreased and is not as bloody, pt is scheduled for dopplers tomorrow, and has an appt with Dr. Paulla Dolly 03/08/2016.  Does Dr. Paulla Dolly want pt to refill the antibiotic?  I spoke with [facility] and told her to refill the antibiotic.

## 2016-03-06 ENCOUNTER — Ambulatory Visit (HOSPITAL_COMMUNITY)
Admission: RE | Admit: 2016-03-06 | Discharge: 2016-03-06 | Disposition: A | Discharge status: Home / Self Care | Payer: PPO | Source: Ambulatory Visit | Attending: Podiatry | Admitting: Podiatry

## 2016-03-06 DIAGNOSIS — E785 Hyperlipidemia, unspecified: Secondary | ICD-10-CM | POA: Diagnosis not present

## 2016-03-06 DIAGNOSIS — I2510 Atherosclerotic heart disease of native coronary artery without angina pectoris: Secondary | ICD-10-CM | POA: Diagnosis not present

## 2016-03-06 DIAGNOSIS — I70203 Unspecified atherosclerosis of native arteries of extremities, bilateral legs: Secondary | ICD-10-CM | POA: Insufficient documentation

## 2016-03-06 DIAGNOSIS — L03032 Cellulitis of left toe: Secondary | ICD-10-CM | POA: Diagnosis not present

## 2016-03-06 DIAGNOSIS — R202 Paresthesia of skin: Secondary | ICD-10-CM | POA: Diagnosis not present

## 2016-03-06 DIAGNOSIS — M79675 Pain in left toe(s): Secondary | ICD-10-CM | POA: Diagnosis not present

## 2016-03-06 DIAGNOSIS — I10 Essential (primary) hypertension: Secondary | ICD-10-CM | POA: Insufficient documentation

## 2016-03-06 DIAGNOSIS — L02612 Cutaneous abscess of left foot: Secondary | ICD-10-CM | POA: Insufficient documentation

## 2016-03-06 DIAGNOSIS — E119 Type 2 diabetes mellitus without complications: Secondary | ICD-10-CM | POA: Diagnosis not present

## 2016-03-06 DIAGNOSIS — R0989 Other specified symptoms and signs involving the circulatory and respiratory systems: Secondary | ICD-10-CM

## 2016-03-06 DIAGNOSIS — G8929 Other chronic pain: Secondary | ICD-10-CM

## 2016-03-06 DIAGNOSIS — J449 Chronic obstructive pulmonary disease, unspecified: Secondary | ICD-10-CM | POA: Insufficient documentation

## 2016-03-08 ENCOUNTER — Encounter: Payer: Self-pay | Admitting: Podiatry

## 2016-03-08 ENCOUNTER — Ambulatory Visit (INDEPENDENT_AMBULATORY_CARE_PROVIDER_SITE_OTHER): Payer: PPO

## 2016-03-08 ENCOUNTER — Ambulatory Visit (INDEPENDENT_AMBULATORY_CARE_PROVIDER_SITE_OTHER): Payer: PPO | Admitting: Podiatry

## 2016-03-08 DIAGNOSIS — L03032 Cellulitis of left toe: Secondary | ICD-10-CM

## 2016-03-08 DIAGNOSIS — L02612 Cutaneous abscess of left foot: Secondary | ICD-10-CM

## 2016-03-08 NOTE — Progress Notes (Signed)
Subjective:     Patient ID: [name], male   DOB: 05-25-1951, 65 y.o.   MRN: [id number]  HPI patient presents with left big toe that he states feels much better at the current time and did have his vascular studies done   Review of Systems     Objective:   Physical Exam Vascular status unchanged with improving left hallux with no current drainage and minimal discomfort when I palpated the left big toe versus intense discomfort of several weeks ago no proximal edema erythema or drainage noted    Assessment:     Poor circulation to the left big toe left but so far appears to be healing adequately    Plan:     Discussed that he may eventually require amputation but at this time we will continue localized soaks and bandage usage. We will do 1 more refill of antibiotics and I gave him strict instructions of any changes were to occur or if any increased pain redness drainage or other issues were to occur he is to come in immediately

## 2016-03-12 DIAGNOSIS — Z720 Tobacco use: Secondary | ICD-10-CM | POA: Diagnosis not present

## 2016-03-12 DIAGNOSIS — E1142 Type 2 diabetes mellitus with diabetic polyneuropathy: Secondary | ICD-10-CM | POA: Diagnosis not present

## 2016-03-12 DIAGNOSIS — E1165 Type 2 diabetes mellitus with hyperglycemia: Secondary | ICD-10-CM | POA: Diagnosis not present

## 2016-03-12 DIAGNOSIS — Z794 Long term (current) use of insulin: Secondary | ICD-10-CM | POA: Diagnosis not present

## 2016-03-23 ENCOUNTER — Encounter: Payer: Self-pay | Admitting: Podiatry

## 2016-03-23 ENCOUNTER — Ambulatory Visit (INDEPENDENT_AMBULATORY_CARE_PROVIDER_SITE_OTHER): Payer: PPO | Admitting: Podiatry

## 2016-03-23 DIAGNOSIS — B351 Tinea unguium: Secondary | ICD-10-CM

## 2016-03-23 DIAGNOSIS — E1151 Type 2 diabetes mellitus with diabetic peripheral angiopathy without gangrene: Secondary | ICD-10-CM

## 2016-03-23 DIAGNOSIS — M79676 Pain in unspecified toe(s): Secondary | ICD-10-CM | POA: Diagnosis not present

## 2016-03-23 DIAGNOSIS — L02612 Cutaneous abscess of left foot: Secondary | ICD-10-CM

## 2016-03-23 DIAGNOSIS — L03032 Cellulitis of left toe: Secondary | ICD-10-CM

## 2016-03-23 NOTE — Progress Notes (Addendum)
Subjective:     Patient ID: [name], male   DOB: Apr 15, 1951, 65 y.o.   MRN: [id number]  HPI this patient presents to the office with chief complaint of long thick painful nails. He has been under treatment for a cellulitis of his left hallux which is been treated with doxycycline. He says that his toe is doing much better, less pain, but there is a little drainage noted at the site of the left great toe. He says the drainage is clear. He also has thick disfigured discolored nails, which are painful walking and wearing his shoes. Finally, he presents the office to be measured for diabetic shoes per Dr. Paulla Dolly   Review of Systems     Objective:   Physical Exam GENERAL APPEARANCE: Alert, conversant. Appropriately groomed. No acute distress.  VASCULAR: Pedal pulses are diminished but  palpable at  [facility] and PT bilateral.  Capillary refill time is immediate to all digits,  Normal temperature gradient.  Digital hair growth is present bilateral  NEUROLOGIC: sensation is normal to 5.07 monofilament at 5/5 sites bilateral.  Light touch is intact bilateral, Muscle strength normal.  MUSCULOSKELETAL: acceptable muscle strength, tone and stability bilateral.  Intrinsic muscluature intact bilateral.  Rectus appearance of foot and digits noted bilateral.   DERMATOLOGIC: skin color, texture, and turgor are within normal limits.  No preulcerative lesions or ulcers  are seen, no interdigital maceration noted.  No open lesions present.   No drainage noted.  NAILS  Thick disfigured discolored nails both feet.  His left hallux toenail has resolving infection with minimal drainage at the distal aspect of nail.      Assessment:     Onychomycosis  Paronychia left hallux.  Diabetic neuropathy.      Plan:     Debride nails B/L  Patient is to be casted for diabetic shoes per Dr. Paulla Dolly.  RTC 3 months for nail care.   Gardiner Barefoot DPM

## 2016-04-12 ENCOUNTER — Other Ambulatory Visit: Payer: Self-pay | Admitting: Nurse Practitioner

## 2016-04-13 DIAGNOSIS — Z794 Long term (current) use of insulin: Secondary | ICD-10-CM | POA: Diagnosis not present

## 2016-04-13 DIAGNOSIS — Z720 Tobacco use: Secondary | ICD-10-CM | POA: Diagnosis not present

## 2016-04-13 DIAGNOSIS — E1142 Type 2 diabetes mellitus with diabetic polyneuropathy: Secondary | ICD-10-CM | POA: Diagnosis not present

## 2016-04-13 DIAGNOSIS — E1165 Type 2 diabetes mellitus with hyperglycemia: Secondary | ICD-10-CM | POA: Diagnosis not present

## 2016-05-01 ENCOUNTER — Ambulatory Visit: Payer: PPO

## 2016-05-02 ENCOUNTER — Ambulatory Visit (INDEPENDENT_AMBULATORY_CARE_PROVIDER_SITE_OTHER): Payer: PPO | Admitting: Podiatry

## 2016-05-02 DIAGNOSIS — L03032 Cellulitis of left toe: Secondary | ICD-10-CM

## 2016-05-02 DIAGNOSIS — L84 Corns and callosities: Secondary | ICD-10-CM

## 2016-05-02 DIAGNOSIS — E1151 Type 2 diabetes mellitus with diabetic peripheral angiopathy without gangrene: Secondary | ICD-10-CM

## 2016-05-02 DIAGNOSIS — L02612 Cutaneous abscess of left foot: Secondary | ICD-10-CM | POA: Diagnosis not present

## 2016-05-02 DIAGNOSIS — E1149 Type 2 diabetes mellitus with other diabetic neurological complication: Secondary | ICD-10-CM

## 2016-05-02 NOTE — Progress Notes (Signed)
Patient ID: [name], male   DOB: 1951/11/03, 65 y.o.   MRN: [id number] Patient presents for diabetic shoe pick up, shoes are tried on for good fit.  Patient received 1 Pair Apex A2100 Classic boat shoe brown in men's 11.5 extra wide and 3 pairs custom molded diabetic inserts.  Verbal and written break in and wear instructions given.  Patient will follow up for scheduled routine care.

## 2016-05-02 NOTE — Patient Instructions (Signed)

## 2016-05-18 ENCOUNTER — Other Ambulatory Visit: Payer: Self-pay | Admitting: Surgery

## 2016-05-18 DIAGNOSIS — C3490 Malignant neoplasm of unspecified part of unspecified bronchus or lung: Secondary | ICD-10-CM

## 2016-06-11 ENCOUNTER — Other Ambulatory Visit: Payer: Self-pay | Admitting: Family Medicine

## 2016-06-22 ENCOUNTER — Ambulatory Visit: Payer: PPO | Admitting: Podiatry

## 2016-07-04 ENCOUNTER — Ambulatory Visit (INDEPENDENT_AMBULATORY_CARE_PROVIDER_SITE_OTHER): Payer: PPO | Admitting: Surgery

## 2016-07-04 ENCOUNTER — Other Ambulatory Visit: Payer: Self-pay | Admitting: *Deleted

## 2016-07-04 ENCOUNTER — Ambulatory Visit
Admission: RE | Admit: 2016-07-04 | Discharge: 2016-07-04 | Disposition: A | Discharge status: Home / Self Care | Payer: PPO | Source: Ambulatory Visit | Attending: Surgery | Admitting: Surgery

## 2016-07-04 ENCOUNTER — Encounter: Payer: Self-pay | Admitting: Surgery

## 2016-07-04 VITALS — BP 119/68 | HR 78 | Resp 20 | Ht 70.5 in | Wt 204.0 lb

## 2016-07-04 DIAGNOSIS — R918 Other nonspecific abnormal finding of lung field: Secondary | ICD-10-CM | POA: Diagnosis not present

## 2016-07-04 DIAGNOSIS — Z902 Acquired absence of lung [part of]: Secondary | ICD-10-CM | POA: Diagnosis not present

## 2016-07-04 DIAGNOSIS — C3490 Malignant neoplasm of unspecified part of unspecified bronchus or lung: Secondary | ICD-10-CM

## 2016-07-04 DIAGNOSIS — R911 Solitary pulmonary nodule: Secondary | ICD-10-CM | POA: Diagnosis not present

## 2016-07-04 DIAGNOSIS — Z85118 Personal history of other malignant neoplasm of bronchus and lung: Secondary | ICD-10-CM

## 2016-07-04 DIAGNOSIS — C3491 Malignant neoplasm of unspecified part of right bronchus or lung: Secondary | ICD-10-CM | POA: Diagnosis not present

## 2016-07-04 NOTE — Progress Notes (Unsigned)
p 

## 2016-07-06 ENCOUNTER — Encounter: Payer: Self-pay | Admitting: Surgery

## 2016-07-06 NOTE — Progress Notes (Signed)
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 He continues to smoke 2 packs of cigarettes per day. He has a chronic cough that is sometimes productive. No hemoptysis. He denies headaches or visual changes, muscle or bone pain. He is eating well and maintaining his weight.  Current Outpatient Prescriptions  Medication Sig Dispense Refill  . aspirin EC 81 MG tablet Take 81 mg by mouth daily.    Marland Kitchen atorvastatin (LIPITOR) 40 MG tablet Take 40 mg by mouth daily.    Marland Kitchen diltiazem (CARDIZEM CD) 120 MG 24 hr capsule Take 1 capsule (120 mg total) by mouth daily. 90 capsule 3  . FLUoxetine (PROZAC) 40 MG capsule TAKE ONE CAPSULE BY MOUTH EVERY DAY 90 capsule 1  . gabapentin (NEURONTIN) 300 MG capsule TAKE 1 CAPSULE BY MOUTH THREE TIMES DAILY 270 capsule 1  . insulin aspart (NOVOLOG) 100 UNIT/ML injection Check blood glucose three (3) times daily before meals. CBG < 70: implement hypoglycemia protocol CBG 70 -120: 0 UNITS CBG 121 - 150: 5 UNITS CBG 151 - 200: 10 UNITS CBG 201 - 250: 15 UNITS CBG 251 -300: 20 UNITS CBG 301 - 350: 25 UNITS CBG 351 - 400: 30 UNITS    . ipratropium-albuterol (DUONEB) 0.5-2.5 (3) MG/3ML SOLN Take 3 mLs by nebulization 4 (four) times daily. 360 mL 11  . LANTUS 100 UNIT/ML injection Inject 30 Units into the skin every morning.  3  . losartan (COZAAR) 25 MG tablet Take 25 mg by mouth daily.    . Multiple Vitamin (MULTIVITAMIN WITH MINERALS) TABS Take 1 tablet by mouth daily.    . nitroGLYCERIN (NITROSTAT) 0.4 MG SL tablet Place 0.4 mg under the tongue every 5 (five) minutes as needed for chest pain.    . ranitidine (ZANTAC) 150 MG capsule Take 1 capsule (150 mg total) by mouth 2 (two) times daily. 60 capsule 11  . tiZANidine (ZANAFLEX) 4 MG tablet Take 8 mg by mouth 2 (two) times daily.    Marland Kitchen topiramate (TOPAMAX) 50  MG tablet Take 1 tablet (50 mg total) by mouth 2 (two) times daily. 360 tablet 0  . traZODone (DESYREL) 100 MG tablet TAKE ONE TABLET BY MOUTH EVERY NIGHT AT BEDTIME 90 tablet 1   No current facility-administered medications for this visit.      Physical Exam:  BP 119/68 (BP Location: Left Arm, Patient Position: Sitting, Cuff Size: Normal)   Pulse 78   Resp 20   Ht 5' 10.5" (1.791 m)   Wt 204 lb (92.5 kg)   SpO2 96% Comment: RA  BMI 28.86 kg/m  He looks well There is no cervical or supraclavicular adenopathy.  Lung exam is clear.  The right thoracotomy scar looks good. There are no skin lesions.  Cardiac exam shows a regular rate and rhythm with normal heart sounds.    Diagnostic Tests:  CLINICAL DATA:  Right lung cancer, right upper lobectomy in 2013. Diabetes. Current smoker. Cough and shortness of breath.  EXAM: CT CHEST WITHOUT CONTRAST  TECHNIQUE: Multidetector CT imaging of the chest was performed following the standard protocol without IV contrast.  COMPARISON:  12/28/2015  FINDINGS: Cardiovascular: Coronary, aortic arch, and branch vessel atherosclerotic vascular disease. Coronary artery stents noted.  Mediastinum/Nodes: Right lower paratracheal lymph node 1.0 cm in short axis on image 42/3, stable. AP window lymph node  0.9 cm in short axis on image 41/3, stable. Lymph node lateral to the left mainstem bronchus 1.2 cm in short axis on image 48/3, stable.  Lungs/Pleura: Right upper lobe wedge resection. 5 mm in long axis right upper lobe nodule on image 24/4, no change from 10/07/2012. Scarring posteriorly in the right upper lobe, likewise unchanged 2013. Several tiny peripheral nodules in the right upper lobe are likewise unchanged.  In the superior segment left lower lobe, a 1.3 by 0.8 cm solid nodule is present on image 63/4. This lesion is not readily apparent on the 2014 exam and measured 7 by 5 mm on 05/26/2014 and 7 by 8 mm on  12/28/2015. The increasing size of this nodule is compatible with malignancy.  Peripheral scarring in the posterior basal segments of both lower lobes. Mosaic attenuation in the lung bases, right greater than left the. Peripheral emphysema.  Upper Abdomen: Mild fullness of the adrenal glands without overt mass. Hypodense lesion in the left kidney upper pole, nonspecific.  Musculoskeletal: Thoracic spondylosis.  IMPRESSION: 1. Enlarging superior segment left lower lobe nodule, currently measuring 1.3 by 0.8 cm. The appearance favors malignancy. 2. Stable borderline mediastinal adenopathy. 3. Coronary, aortic arch, and branch vessel atherosclerotic vascular disease. 4. Mild mosaic attenuation in the lungs, possibly from air trapping. Emphysema.   Electronically Signed   By: Van Clines M.D.   On: 07/04/2016 10:41   Impression:  I have personally reviewed and interpreted his CT of the chest. The nodule in the superior segment of the left lower lobe is larger than on his previous scan and is now 1.3 x 0.8 cm compared to 7 x 9 mm in February. This lesion had been stable in size. It is suspicious for a bronchogenic carcinoma. I think a PET scan is needed to further evaluate this and guide biopsy. He had severe COPD and is not a candidate for any surgery. I reviewed the CT scan with the patient and his wife and my recommendation. All of their questions have been answered. I again counseled him about smoking cessation but he is not interested.  Plan:  He will have a PET scan and then return to see me to review results and make further plans.   I spent 20 minutes evaluating this patient and  > 50% of this time was spent face to face counseling and coordinating the care of this patient's enlarging lung nodule.   [name], MD [facility] [phone]

## 2016-07-10 DIAGNOSIS — Z794 Long term (current) use of insulin: Secondary | ICD-10-CM | POA: Diagnosis not present

## 2016-07-10 DIAGNOSIS — E1142 Type 2 diabetes mellitus with diabetic polyneuropathy: Secondary | ICD-10-CM | POA: Diagnosis not present

## 2016-07-10 DIAGNOSIS — Z720 Tobacco use: Secondary | ICD-10-CM | POA: Diagnosis not present

## 2016-07-10 DIAGNOSIS — R109 Unspecified abdominal pain: Secondary | ICD-10-CM | POA: Diagnosis not present

## 2016-07-10 DIAGNOSIS — E1165 Type 2 diabetes mellitus with hyperglycemia: Secondary | ICD-10-CM | POA: Diagnosis not present

## 2016-07-11 ENCOUNTER — Ambulatory Visit: Payer: PPO | Admitting: Surgery

## 2016-07-16 ENCOUNTER — Encounter (HOSPITAL_COMMUNITY): Payer: PPO

## 2016-07-18 ENCOUNTER — Ambulatory Visit (HOSPITAL_COMMUNITY)
Admission: RE | Admit: 2016-07-18 | Discharge: 2016-07-18 | Disposition: A | Discharge status: Home / Self Care | Payer: PPO | Source: Ambulatory Visit | Attending: Surgery | Admitting: Surgery

## 2016-07-18 ENCOUNTER — Ambulatory Visit: Payer: PPO | Admitting: Surgery

## 2016-07-18 DIAGNOSIS — R918 Other nonspecific abnormal finding of lung field: Secondary | ICD-10-CM | POA: Insufficient documentation

## 2016-07-18 DIAGNOSIS — J449 Chronic obstructive pulmonary disease, unspecified: Secondary | ICD-10-CM | POA: Diagnosis not present

## 2016-07-18 DIAGNOSIS — R590 Localized enlarged lymph nodes: Secondary | ICD-10-CM | POA: Diagnosis not present

## 2016-07-18 DIAGNOSIS — C3490 Malignant neoplasm of unspecified part of unspecified bronchus or lung: Secondary | ICD-10-CM

## 2016-07-18 DIAGNOSIS — C3411 Malignant neoplasm of upper lobe, right bronchus or lung: Secondary | ICD-10-CM | POA: Diagnosis not present

## 2016-07-18 LAB — GLUCOSE, CAPILLARY: GLUCOSE-CAPILLARY: 119 mg/dL — AB (ref 65–99)

## 2016-07-18 MED ORDER — FLUDEOXYGLUCOSE F - 18 (FDG) INJECTION: 9.3000 | Freq: Once | INTRAVENOUS | Status: DC | PRN

## 2016-07-19 ENCOUNTER — Ambulatory Visit (INDEPENDENT_AMBULATORY_CARE_PROVIDER_SITE_OTHER): Payer: PPO | Admitting: Surgery

## 2016-07-19 ENCOUNTER — Encounter: Payer: Self-pay | Admitting: Surgery

## 2016-07-19 ENCOUNTER — Other Ambulatory Visit: Payer: Self-pay | Admitting: *Deleted

## 2016-07-19 ENCOUNTER — Other Ambulatory Visit (HOSPITAL_COMMUNITY): Payer: PPO

## 2016-07-19 VITALS — BP 106/66 | HR 80 | Resp 20 | Ht 70.5 in | Wt 204.0 lb

## 2016-07-19 DIAGNOSIS — Z85118 Personal history of other malignant neoplasm of bronchus and lung: Secondary | ICD-10-CM

## 2016-07-19 DIAGNOSIS — R918 Other nonspecific abnormal finding of lung field: Secondary | ICD-10-CM | POA: Diagnosis not present

## 2016-07-19 DIAGNOSIS — R911 Solitary pulmonary nodule: Secondary | ICD-10-CM

## 2016-07-19 NOTE — Progress Notes (Signed)
HPI:  The patient returns today to discuss the results of his recent PET scan and to make further plans for treating an enlarging left lower lobe nodule. 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 He continues to smoke 2 packs of cigarettes per day. He has some chronic shortness of breath and cough that are stable.   Current Outpatient Prescriptions  Medication Sig Dispense Refill  . aspirin EC 81 MG tablet Take 81 mg by mouth daily.    Marland Kitchen atorvastatin (LIPITOR) 40 MG tablet Take 40 mg by mouth daily.    Marland Kitchen diltiazem (CARDIZEM CD) 120 MG 24 hr capsule Take 1 capsule (120 mg total) by mouth daily. 90 capsule 3  . FLUoxetine (PROZAC) 40 MG capsule TAKE ONE CAPSULE BY MOUTH EVERY DAY 90 capsule 1  . gabapentin (NEURONTIN) 300 MG capsule TAKE 1 CAPSULE BY MOUTH THREE TIMES DAILY 270 capsule 1  . insulin aspart (NOVOLOG) 100 UNIT/ML injection Check blood glucose three (3) times daily before meals. CBG < 70: implement hypoglycemia protocol CBG 70 -120: 0 UNITS CBG 121 - 150: 5 UNITS CBG 151 - 200: 10 UNITS CBG 201 - 250: 15 UNITS CBG 251 -300: 20 UNITS CBG 301 - 350: 25 UNITS CBG 351 - 400: 30 UNITS    . ipratropium-albuterol (DUONEB) 0.5-2.5 (3) MG/3ML SOLN Take 3 mLs by nebulization 4 (four) times daily. 360 mL 11  . LANTUS 100 UNIT/ML injection Inject 30 Units into the skin every morning.  3  . losartan (COZAAR) 25 MG tablet Take 25 mg by mouth daily.    . Multiple Vitamin (MULTIVITAMIN WITH MINERALS) TABS Take 1 tablet by mouth daily.    . nitroGLYCERIN (NITROSTAT) 0.4 MG SL tablet Place 0.4 mg under the tongue every 5 (five) minutes as needed for chest pain.    . ranitidine (ZANTAC) 150 MG capsule Take 1 capsule (150 mg total) by mouth 2 (two) times daily. 60 capsule 11  . tiZANidine (ZANAFLEX) 4 MG tablet Take 8 mg by mouth 2 (two) times daily.    Marland Kitchen topiramate  (TOPAMAX) 50 MG tablet Take 1 tablet (50 mg total) by mouth 2 (two) times daily. 360 tablet 0  . traZODone (DESYREL) 100 MG tablet TAKE ONE TABLET BY MOUTH EVERY NIGHT AT BEDTIME 90 tablet 1   No current facility-administered medications for this visit.    Facility-Administered Medications Ordered in Other Visits  Medication Dose Route Frequency Provider Last Rate Last Dose  . fludeoxyglucose F - 18 (FDG) injection 9.3 millicurie  9.3 millicurie Intravenous Once PRN Marin Olp, MD         Physical Exam: BP 106/66 (BP Location: Right Arm, Patient Position: Sitting, Cuff Size: Normal)   Pulse 80   Resp 20   Ht 5' 10.5" (1.791 m)   Wt 204 lb (92.5 kg)   SpO2 96% Comment: RA  BMI 28.86 kg/m  He looks well There is no cervical or supraclavicular adenopathy.  Lung exam is clear.  The right thoracotomy scar looks good. There are no skin lesions.  Cardiac exam shows a regular rate and rhythm with normal heart sounds.    Diagnostic Tests:  CLINICAL DATA:  Subsequent treatment strategy for lung carcinoma. Patient status post RIGHT thoracotomy with RIGHT upper lobe lobectomy and mediastinal nodal dissection for T1a N0 squamous cell carcinoma. Enlarging LEFT lower lobe pulmonary nodule.  EXAM: NUCLEAR MEDICINE PET SKULL BASE TO THIGH  TECHNIQUE: 9.3 mCi F-18 FDG was injected intravenously. Full-ring PET imaging was performed from the skull base to thigh after the radiotracer. CT data was obtained and used for attenuation correction and anatomic localization.  FASTING BLOOD GLUCOSE:  Value: 119 mg/dl  COMPARISON:  PET-CT 01/02/2012, CT thorax 07/04/2016, 12/28/2015  FINDINGS: NECK  No hypermetabolic lymph nodes in the neck.  CHEST  Within the superior segment of the LEFT lower lobe, nodule of concern measures 13 mm (image 81, series 4) and has intense metabolic activity for size (SUV max equal 9.9).  RIGHT lower paratracheal lymph node measures 10 mm short  axis SUV max equal 4.6. LEFT lower paratracheal lymph node measures 9 mm with SUV max equal 3.6. These lymph nodes are stable in size over time.  Several smaller nodules within the LEFT lung RIGHT lung are stable. These do not have associated metabolic activity but are below the size limit for accurate PET characterization.  Example nodule measuring 4 mm in the RIGHT upper lobe on image 57, series 4.  RIGHT middle lobe nodule measuring 4 mm on image 84, series 4.  Nodule in the RIGHT lower lobe measuring 4 mm on image 85, series 4.  ABDOMEN/PELVIS  No abnormal hypermetabolic activity within the liver, pancreas, adrenal glands, or spleen. No hypermetabolic lymph nodes in the abdomen or pelvis.  Dense atherosclerotic calcification abdominal aorta.  SKELETON  No focal hypermetabolic activity to suggest skeletal metastasis.  IMPRESSION: 1. Hypermetabolic LEFT lower lobe pulmonary nodule, enlarged over time, is consistent with BRONCHOGENIC CARCINOMA. 2. Stable mildly enlarged mediastinal lymph nodes with moderate metabolic activity are favored reactive adenopathy from COPD. 3. Stable small sub 5 mm nodules in the RIGHT lung.   Electronically Signed   By: Suzy Bouchard M.D.   On: 07/18/2016 15:25   Impression:  I have personally reviewed and interpreted his CT scan and PET scan. The 13 mm left lower lobe nodule is hypermetabolic and most consistent with bronchogenic carcinoma. There are some enlarged mediastinal lymph nodes in the right and left lower paratracheal regions that have been there for years and stable in size. They are moderately hypermetabolic but given their stability in size for years I think that they are probably reactive from chronic smoking. There are no other areas of hypermetabolic activity. I don't think he is a good candidate for surgery given his COPD with active 2 ppd smoking, prior right upper lobectomy that he almost died from with ARDS  and renal failure, history of coronary artery disease, and chronic kidney disease. He is not interested in stopping smoking. I think SBRT would be a better treatment for him. He will need a biopsy for diagnosis. The lesion is peripheral and 13 mm so it may be amenable to CT-guided needle biopsy. If IR does not feel that this feasible then we may consider ENB. A wedge resection for diagnosis and treatment may be possible but the risk would be high in this patient.  Plan:  Will consult IR for a CT guided needle biopsy of the LLL lesion and if that can be done I will follow up afterwards to discuss the results with him and make further plans.   [name], MD [facility] [phone]

## 2016-07-20 ENCOUNTER — Ambulatory Visit (INDEPENDENT_AMBULATORY_CARE_PROVIDER_SITE_OTHER): Payer: PPO | Admitting: Podiatry

## 2016-07-20 ENCOUNTER — Encounter: Payer: Self-pay | Admitting: Podiatry

## 2016-07-20 VITALS — Ht 70.5 in | Wt 204.0 lb

## 2016-07-20 DIAGNOSIS — M79676 Pain in unspecified toe(s): Secondary | ICD-10-CM | POA: Diagnosis not present

## 2016-07-20 DIAGNOSIS — B351 Tinea unguium: Secondary | ICD-10-CM | POA: Diagnosis not present

## 2016-07-20 DIAGNOSIS — E1151 Type 2 diabetes mellitus with diabetic peripheral angiopathy without gangrene: Secondary | ICD-10-CM | POA: Diagnosis not present

## 2016-07-20 NOTE — Progress Notes (Signed)
Subjective:     Patient ID: [name], male   DOB: 10-13-1951, 65 y.o.   MRN: [id number]  HPI this patient presents to the office with chief complaint of long thick painful nails. The nails have become thick and long and are painful walking and wearing his shoe.  He is diabetic.  He presents for preventive footcare services.   Review of Systems     Objective:   Physical Exam GENERAL APPEARANCE: Alert, conversant. Appropriately groomed. No acute distress.  VASCULAR: Pedal pulses are diminished but  palpable at  [facility] and PT bilateral.  Capillary refill time is immediate to all digits,  Normal temperature gradient.  Digital hair growth is present bilateral  NEUROLOGIC: sensation is normal to 5.07 monofilament at 5/5 sites bilateral.  Light touch is intact bilateral, Muscle strength normal.  MUSCULOSKELETAL: acceptable muscle strength, tone and stability bilateral.  Intrinsic muscluature intact bilateral.  Rectus appearance of foot and digits noted bilateral.   DERMATOLOGIC: skin color, texture, and turgor are within normal limits.  No preulcerative lesions or ulcers  are seen, no interdigital maceration noted.  No open lesions present.   No drainage noted.  NAILS  Thick disfigured discolored nails both feet.        Assessment:     Onychomycosis  .  Diabetic neuropathy.      Plan:     Debride nails B/L   RTC 3 months for nail care.    Gardiner Barefoot DPM   Gardiner Barefoot DPM

## 2016-07-23 ENCOUNTER — Telehealth: Payer: Self-pay | Admitting: Family Medicine

## 2016-07-23 NOTE — Telephone Encounter (Signed)
Received fax from [place] back Auth# [id number] Treating Provider Dr. Gilford Raid # of Visits 1 Start Date 06/28/2016 End Date 12/25/2016 CPT 71250 DX C34.90

## 2016-07-27 ENCOUNTER — Other Ambulatory Visit: Payer: Self-pay | Admitting: Radiology

## 2016-07-30 ENCOUNTER — Encounter (HOSPITAL_COMMUNITY): Payer: Self-pay

## 2016-07-30 ENCOUNTER — Ambulatory Visit (HOSPITAL_COMMUNITY)
Admission: RE | Admit: 2016-07-30 | Discharge: 2016-07-30 | Disposition: A | Discharge status: Home / Self Care | Payer: PPO | Source: Ambulatory Visit | Attending: Interventional Radiology | Admitting: Interventional Radiology

## 2016-07-30 ENCOUNTER — Ambulatory Visit (HOSPITAL_COMMUNITY)
Admission: RE | Admit: 2016-07-30 | Discharge: 2016-07-30 | Disposition: A | Discharge status: Home / Self Care | Payer: PPO | Source: Ambulatory Visit | Attending: Surgery | Admitting: Surgery

## 2016-07-30 DIAGNOSIS — N189 Chronic kidney disease, unspecified: Secondary | ICD-10-CM | POA: Diagnosis not present

## 2016-07-30 DIAGNOSIS — C3432 Malignant neoplasm of lower lobe, left bronchus or lung: Secondary | ICD-10-CM | POA: Diagnosis not present

## 2016-07-30 DIAGNOSIS — R911 Solitary pulmonary nodule: Secondary | ICD-10-CM

## 2016-07-30 DIAGNOSIS — Z79899 Other long term (current) drug therapy: Secondary | ICD-10-CM | POA: Diagnosis not present

## 2016-07-30 DIAGNOSIS — E1122 Type 2 diabetes mellitus with diabetic chronic kidney disease: Secondary | ICD-10-CM | POA: Diagnosis not present

## 2016-07-30 DIAGNOSIS — Z9889 Other specified postprocedural states: Secondary | ICD-10-CM

## 2016-07-30 DIAGNOSIS — F17210 Nicotine dependence, cigarettes, uncomplicated: Secondary | ICD-10-CM | POA: Diagnosis not present

## 2016-07-30 DIAGNOSIS — Z794 Long term (current) use of insulin: Secondary | ICD-10-CM | POA: Diagnosis not present

## 2016-07-30 DIAGNOSIS — I129 Hypertensive chronic kidney disease with stage 1 through stage 4 chronic kidney disease, or unspecified chronic kidney disease: Secondary | ICD-10-CM | POA: Insufficient documentation

## 2016-07-30 DIAGNOSIS — Z7982 Long term (current) use of aspirin: Secondary | ICD-10-CM | POA: Insufficient documentation

## 2016-07-30 DIAGNOSIS — J984 Other disorders of lung: Secondary | ICD-10-CM | POA: Diagnosis not present

## 2016-07-30 DIAGNOSIS — E1142 Type 2 diabetes mellitus with diabetic polyneuropathy: Secondary | ICD-10-CM | POA: Insufficient documentation

## 2016-07-30 LAB — APTT: APTT: 39 s — AB (ref 24–36)

## 2016-07-30 LAB — PROTIME-INR
INR: 0.97
Prothrombin Time: 12.8 seconds (ref 11.4–15.2)

## 2016-07-30 LAB — CBC
HEMATOCRIT: 38.6 % — AB (ref 39.0–52.0)
Hemoglobin: 13 g/dL (ref 13.0–17.0)
MCH: 31 pg (ref 26.0–34.0)
MCHC: 33.7 g/dL (ref 30.0–36.0)
MCV: 91.9 fL (ref 78.0–100.0)
PLATELETS: 203 10*3/uL (ref 150–400)
RBC: 4.2 MIL/uL — ABNORMAL LOW (ref 4.22–5.81)
RDW: 13.8 % (ref 11.5–15.5)
WBC: 8.8 10*3/uL (ref 4.0–10.5)

## 2016-07-30 LAB — GLUCOSE, CAPILLARY: Glucose-Capillary: 151 mg/dL — ABNORMAL HIGH (ref 65–99)

## 2016-07-30 MED ORDER — MIDAZOLAM HCL 2 MG/2ML IJ SOLN
INTRAMUSCULAR | Status: AC
  Filled 2016-07-30: qty 4

## 2016-07-30 MED ORDER — SODIUM CHLORIDE 0.9 % IV SOLN
INTRAVENOUS | Status: DC | PRN
  Administered 2016-07-30: 30 mL/h via INTRAVENOUS

## 2016-07-30 MED ORDER — FENTANYL CITRATE (PF) 100 MCG/2ML IJ SOLN
INTRAMUSCULAR | Status: DC | PRN
  Administered 2016-07-30: 50 ug via INTRAVENOUS

## 2016-07-30 MED ORDER — LIDOCAINE HCL 1 % IJ SOLN
INTRAMUSCULAR | Status: AC
  Filled 2016-07-30: qty 20

## 2016-07-30 MED ORDER — FENTANYL CITRATE (PF) 100 MCG/2ML IJ SOLN
INTRAMUSCULAR | Status: AC
  Filled 2016-07-30: qty 4

## 2016-07-30 MED ORDER — SODIUM CHLORIDE 0.9 % IV SOLN: INTRAVENOUS | Status: DC

## 2016-07-30 MED ORDER — MIDAZOLAM HCL 2 MG/2ML IJ SOLN
INTRAMUSCULAR | Status: DC | PRN
  Administered 2016-07-30: 1 mg via INTRAVENOUS

## 2016-07-30 NOTE — Sedation Documentation (Signed)
o2 d/c'd 

## 2016-07-30 NOTE — Sedation Documentation (Signed)
Patient is resting comfortably. 

## 2016-07-30 NOTE — Discharge Instructions (Signed)

## 2016-07-30 NOTE — H&P (Signed)
Chief Complaint: Patient was seen in consultation today for left lung mass biopsy at the request of [name]  Referring Physician(s): [name]  Supervising Physician: [name]  Patient Status: Outpatient  History of Present Illness: [name] is a 65 y.o. male   Pt with Hx Squamous cell cancer 2013 RU lobectomy ++ smoker Left lower lobe mass; followed and enlarging per Dr [name] +PET 07/18/16: IMPRESSION: 1. Hypermetabolic LEFT lower lobe pulmonary nodule, enlarged over time, is consistent with BRONCHOGENIC CARCINOMA. 2. Stable mildly enlarged mediastinal lymph nodes with moderate metabolic activity are favored reactive adenopathy from COPD. 3. Stable small sub 5 mm nodules in the RIGHT lung.  Request for biopsy Approved per Dr [name]    Past Medical History:  Diagnosis Date  . Allergy   . Anemia   . Anginal pain ([place])   . Arthritis    "hands"  . Asthma   . Back pain    4 deteriorating disc and receives an injection q3-82mo;has been doing this for about 464yr . Blood transfusion    as a child  . Bronchitis   . [place]   Non-small cell lung cancer  . Chronic kidney disease    acute kidney failure post surgery  . COPD (chronic obstructive pulmonary disease) (HCC)    uses Albuterol and Spiriva daily  . Coronary artery disease    has 1 stent  . Depression    takes Prozac daily  . Emphysema    sees Dr.Ramaswami for this  . GERD (gastroesophageal reflux disease)    takes Prilosec daily  . Glaucoma    hx of  . H/O hiatal hernia   . Headache(784.0)   . Hypertension    takes  HYzaar daily  . Insomnia    takes Trazodone nightly  . Lung mass    right upper lobe  . [place] infarct ([facility]  . Obesity   . [place]roid disease ([place]  . Periodic limb movements of sleep   . Peripheral neuropathy ([facility] [place]  . Pneumonia    hx of' 65 yo,rd' last time in 2012  . Productive cough    white in color but no odor  . Shortness of  breath    with exertion   . Sleep apnea    uses BiPaP  . Syncope and collapse 05/26/2013  . Thyroid disease   . Type II diabetes mellitus (HCC)    takes Metformin bid and Novolog and Lantus daily    Past Surgical History:  Procedure Laterality Date  . CARDIAC CATHETERIZATION  2006/2008/2009  . CARPAL TUNNEL RELEASE     bilateral  . COLONOSCOPY    . CORONARY ANGIOPLASTY WITH STENT PLACEMENT     1 stent  . ELBOW SURGERY     ulnar nerve; left  . ESOPHAGOGASTRODUODENOSCOPY    . EYE SURGERY    . INGUINAL HERNIA REPAIR  1990's   double inguinal  . LOBECTOMY  03/17/2012   upper right side with resection  . REFRACTIVE SURGERY     bilaterally  . ROTATOR CUFF REPAIR     left    Allergies: Actos [pioglitazone]; Avandia [rosiglitazone]; and Dilaudid [hydromorphone hcl]  Medications: Prior to Admission medications   Medication Sig Start Date End Date Taking? Authorizing Provider  aspirin EC 81 MG tablet Take 81 mg by mouth daily.   Yes Historical Provider, MD  atorvastatin (LIPITOR) 40 MG tablet Take 40 mg by mouth daily.   Yes  Historical Provider, MD  diltiazem (CARDIZEM CD) 120 MG 24 hr capsule Take 1 capsule (120 mg total) by mouth daily. 09/19/15  Yes Belva Crome, MD  FLUoxetine (PROZAC) 40 MG capsule TAKE ONE CAPSULE BY MOUTH EVERY DAY 02/17/16  Yes [name], MD  gabapentin (NEURONTIN) 300 MG capsule TAKE 1 CAPSULE BY MOUTH THREE TIMES DAILY 10/18/15  Yes [name], MD  insulin aspart (NOVOLOG) 100 UNIT/ML injection Check blood glucose three (3) times daily before meals. CBG < 70: implement hypoglycemia protocol CBG 70 -120: 0 UNITS CBG 121 - 150: 5 UNITS CBG 151 - 200: 10 UNITS CBG 201 - 250: 15 UNITS CBG 251 -300: 20 UNITS CBG 301 - 350: 25 UNITS CBG 351 - 400: 30 UNITS   Yes Historical Provider, MD  ipratropium-albuterol (DUONEB) 0.5-2.5 (3) MG/3ML SOLN Take 3 mLs by nebulization 4 (four) times daily. Patient taking differently: Take 3 mLs by nebulization  every 6 (six) hours as needed.  08/17/15  Yes Brand Males, MD  LANTUS 100 UNIT/ML injection Inject 22 Units into the skin every morning.  08/26/15  Yes Historical Provider, MD  losartan (COZAAR) 25 MG tablet Take 25 mg by mouth daily.   Yes Historical Provider, MD  Multiple Vitamin (MULTIVITAMIN WITH MINERALS) TABS Take 1 tablet by mouth daily.   Yes Historical Provider, MD  ranitidine (ZANTAC) 150 MG capsule Take 1 capsule (150 mg total) by mouth 2 (two) times daily. 05/05/13  Yes [name], MD  tiZANidine (ZANAFLEX) 4 MG tablet Take 8 mg by mouth 2 (two) times daily.   Yes Historical Provider, MD  topiramate (TOPAMAX) 50 MG tablet Take 1 tablet (50 mg total) by mouth 2 (two) times daily. 04/12/16  Yes [name], NP  traZODone (DESYREL) 100 MG tablet TAKE ONE TABLET BY MOUTH EVERY NIGHT AT BEDTIME 06/11/16  Yes [name], MD  nitroGLYCERIN (NITROSTAT) 0.4 MG SL tablet Place 0.4 mg under the tongue every 5 (five) minutes as needed for chest pain.    Historical Provider, MD     Family History  Problem Relation Age of Onset  . COPD Mother   . Diabetes Mother   . Hypertension Mother   . Heart attack Father   . Diabetes Father   . Anesthesia problems Neg Hx   . Hypotension Neg Hx   . Malignant hyperthermia Neg Hx   . Pseudochol deficiency Neg Hx     Social History   Social History  . Marital status: Married    Spouse name: mary lou  . Number of children: 3  . Years of education: trade    Occupational History  . truck driver     retired   Social History Main Topics  . Smoking status: Current Every Day Smoker    Packs/day: 1.00    Years: 45.00    Types: Cigarettes    Last attempt to quit: 03/26/2012  . Smokeless tobacco: Never Used  . Alcohol use Yes     Comment: gallon per week liquor "Haven't drink in 6 weeks"  . Drug use:     Types: Cocaine     Comment: quit 1990's  . Sexual activity: Not Currently   Other Topics Concern  . 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  All other systems are negative.  Review of Systems  Constitutional: Positive for fatigue and fever. Negative for activity change and appetite change.  Respiratory: Positive for cough, shortness of breath and wheezing.   Cardiovascular: Negative for chest pain.  Gastrointestinal: Negative for abdominal pain.  Neurological: Positive for weakness.  Psychiatric/Behavioral: Negative for behavioral problems and confusion.    Vital Signs: BP 118/61   Pulse 65   Temp 97.7 F (36.5 C) (Oral)   Resp 18   Ht '5\' 11"'$  (1.803 m)   Wt 204 lb (92.5 kg)   SpO2 98%   BMI 28.45 kg/m   Physical Exam  Constitutional: He is oriented to person, place, and time.  Cardiovascular: Normal rate and regular rhythm.   Pulmonary/Chest: Effort normal. He has wheezes. He has rales.  Abdominal: Soft. Bowel sounds are normal. There is no tenderness.  Musculoskeletal: Normal range of motion.  Neurological: He is alert and oriented to person, place, and time.  Skin: Skin is warm and dry.  Psychiatric: He has a normal mood and affect. His behavior is normal. Judgment and thought content normal.  Nursing note and vitals reviewed.   Mallampati Score:  MD Evaluation Airway: WNL Heart: WNL Abdomen: WNL Chest/ Lungs: WNL ASA  Classification: 3 Mallampati/Airway Score: One  Imaging: Ct Chest Wo Contrast  Result Date: 07/04/2016 CLINICAL DATA:  Right lung cancer, right upper lobectomy in 2013. Diabetes. Current smoker. Cough and shortness of breath. EXAM: CT CHEST WITHOUT CONTRAST TECHNIQUE: Multidetector CT imaging of the chest was performed following the standard protocol without IV contrast. COMPARISON:  12/28/2015 FINDINGS:  Cardiovascular: Coronary, aortic arch, and branch vessel atherosclerotic vascular disease. Coronary artery stents noted. Mediastinum/Nodes: Right lower paratracheal lymph node 1.0 cm in short axis on image 42/3, stable. AP window lymph node 0.9 cm in short axis on image 41/3, stable. Lymph node lateral to the left mainstem bronchus 1.2 cm in short axis on image 48/3, stable. Lungs/Pleura: Right upper lobe wedge resection. 5 mm in long axis right upper lobe nodule on image 24/4, no change from 10/07/2012. Scarring posteriorly in the right upper lobe, likewise unchanged 2013. Several tiny peripheral nodules in the right upper lobe are likewise unchanged. In the superior segment left lower lobe, a 1.3 by 0.8 cm solid nodule is present on image 63/4. This lesion is not readily apparent on the 2014 exam and measured 7 by 5 mm on 05/26/2014 and 7 by 8 mm on 12/28/2015. The increasing size of this nodule is compatible with malignancy. Peripheral scarring in the posterior basal segments of both lower lobes. Mosaic attenuation in the lung bases, right greater than left the. Peripheral emphysema. Upper Abdomen: Mild fullness of the adrenal glands without overt mass. Hypodense lesion in the left kidney upper pole, nonspecific. Musculoskeletal: Thoracic spondylosis. IMPRESSION: 1. Enlarging superior segment left lower lobe nodule, currently measuring 1.3 by 0.8 cm. The appearance favors malignancy. 2. Stable borderline mediastinal adenopathy. 3. Coronary, aortic arch, and branch vessel atherosclerotic vascular disease. 4. Mild mosaic attenuation in the lungs, possibly from air trapping. Emphysema. Electronically Signed   By: Van Clines M.D.   On: 07/04/2016 10:41   Nm Pet Image Initial (pi) Skull Base To Thigh  Result Date: 07/18/2016 CLINICAL DATA:  Subsequent treatment strategy for lung carcinoma. Patient status post RIGHT thoracotomy with RIGHT upper lobe lobectomy and mediastinal nodal  dissection for T1a N0  squamous cell carcinoma. Enlarging LEFT lower lobe pulmonary nodule. EXAM: NUCLEAR MEDICINE PET SKULL BASE TO THIGH TECHNIQUE: 9.3 mCi F-18 FDG was injected intravenously. Full-ring PET imaging was performed from the skull base to thigh after the radiotracer. CT data was obtained and used for attenuation correction and anatomic localization. FASTING BLOOD GLUCOSE:  Value: 119 mg/dl COMPARISON:  PET-CT 01/02/2012, CT thorax 07/04/2016, 12/28/2015 FINDINGS: NECK No hypermetabolic lymph nodes in the neck. CHEST Within the superior segment of the LEFT lower lobe, nodule of concern measures 13 mm (image 81, series 4) and has intense metabolic activity for size (SUV max equal 9.9). RIGHT lower paratracheal lymph node measures 10 mm short axis SUV max equal 4.6. LEFT lower paratracheal lymph node measures 9 mm with SUV max equal 3.6. These lymph nodes are stable in size over time. Several smaller nodules within the LEFT lung RIGHT lung are stable. These do not have associated metabolic activity but are below the size limit for accurate PET characterization. Example nodule measuring 4 mm in the RIGHT upper lobe on image 57, series 4. RIGHT middle lobe nodule measuring 4 mm on image 84, series 4. Nodule in the RIGHT lower lobe measuring 4 mm on image 85, series 4. ABDOMEN/PELVIS No abnormal hypermetabolic activity within the liver, pancreas, adrenal glands, or spleen. No hypermetabolic lymph nodes in the abdomen or pelvis. Dense atherosclerotic calcification abdominal aorta. SKELETON No focal hypermetabolic activity to suggest skeletal metastasis. IMPRESSION: 1. Hypermetabolic LEFT lower lobe pulmonary nodule, enlarged over time, is consistent with BRONCHOGENIC CARCINOMA. 2. Stable mildly enlarged mediastinal lymph nodes with moderate metabolic activity are favored reactive adenopathy from COPD. 3. Stable small sub 5 mm nodules in the RIGHT lung. Electronically Signed   By: Suzy Bouchard M.D.   On: 07/18/2016 15:25     Labs:  CBC:  Recent Labs  07/30/16 0557  WBC 8.8  HGB 13.0  HCT 38.6*  PLT 203    COAGS:  Recent Labs  07/30/16 0557  INR 0.97  APTT 39*    BMP: No results for input(s): NA, K, CL, CO2, GLUCOSE, BUN, CALCIUM, CREATININE, GFRNONAA, GFRAA in the last 8760 hours.  Invalid input(s): CMP  LIVER FUNCTION TESTS: No results for input(s): BILITOT, AST, ALT, ALKPHOS, PROT, ALBUMIN in the last 8760 hours.  TUMOR MARKERS: No results for input(s): AFPTM, CEA, CA199, CHROMGRNA in the last 8760 hours.  Assessment and Plan:  Hx RU lobectomy 2013; +Sq cell Ca Enlarging +PET LLL mass Now scheduled for biopsy of same Risks and Benefits discussed with the patient including, but not limited to bleeding, hemoptysis, respiratory failure requiring intubation, infection, pneumothorax requiring chest tube placement, stroke from air embolism or even death. All of the patient's questions were answered, patient is agreeable to proceed. Consent signed and in chart.   Thank you for this interesting consult.  I greatly enjoyed meeting KUSH FARABEE and look forward to participating in their care.  A copy of this report was sent to the requesting provider on this date.  Electronically Signed: Monia Sabal A 07/30/2016, 7:10 AM   I spent a total of  30 Minutes   in face to face in clinical consultation, greater than 50% of which was counseling/coordinating care for left lung mass bx

## 2016-07-30 NOTE — Procedures (Signed)
Interventional Radiology Procedure Note  Procedure: CT guided biopsy of LLL pulmonary nodule.  Complications: No immediate Recommendations: - Bedrest until CXR cleared.  Minimize talking, coughing or otherwise straining.  - Follow up 2 hr CXR pending   Signed,  Rayan Dyal K. Drayven Marchena, MD   

## 2016-07-30 NOTE — Sedation Documentation (Signed)
MD at bedside.  DR Laurence Ferrari in procedure explained, questions answered.

## 2016-08-01 ENCOUNTER — Encounter: Payer: Self-pay | Admitting: Surgery

## 2016-08-01 ENCOUNTER — Ambulatory Visit (INDEPENDENT_AMBULATORY_CARE_PROVIDER_SITE_OTHER): Payer: PPO | Admitting: Surgery

## 2016-08-01 VITALS — BP 130/64 | HR 84 | Resp 20 | Ht 71.0 in | Wt 204.0 lb

## 2016-08-01 DIAGNOSIS — Z902 Acquired absence of lung [part of]: Secondary | ICD-10-CM

## 2016-08-01 DIAGNOSIS — Z85118 Personal history of other malignant neoplasm of bronchus and lung: Secondary | ICD-10-CM | POA: Diagnosis not present

## 2016-08-01 DIAGNOSIS — R918 Other nonspecific abnormal finding of lung field: Secondary | ICD-10-CM

## 2016-08-02 ENCOUNTER — Telehealth: Payer: Self-pay | Admitting: *Deleted

## 2016-08-02 NOTE — Telephone Encounter (Signed)
Oncology Nurse Navigator Documentation  Oncology Nurse Navigator Flowsheets 08/02/2016  Navigator Encounter Type Introductory phone call;Telephone/I received referral on [name] yesterday from Dr. [name].  I called to schedule him for [place] to see Rad Onc.  I called but was unable to reach.  I left a vm message with my name and phone number to call.   Telephone Outgoing Call  Treatment Phase Pre-Tx/Tx Discussion  Barriers/Navigation Needs Coordination of Care  Interventions Coordination of Care  Coordination of Care Appts  Acuity Level 1  Acuity Level 1 Initial guidance, education and coordination as needed  Time Spent with Patient 15

## 2016-08-02 NOTE — Telephone Encounter (Signed)
Oncology Nurse Navigator Documentation  Oncology Nurse Navigator Flowsheets 08/02/2016  Navigator Encounter Type Telephone/Updated on appt for [name] on 08/09/16.  I will update Rad Onc scheduling to schedule.   Telephone Incoming Call  Treatment Phase Pre-Tx/Tx Discussion  Barriers/Navigation Needs Coordination of Care  Interventions Coordination of Care  Coordination of Care Appts  Acuity Level 2  Acuity Level 1 Initial guidance, education and coordination as needed  Acuity Level 2 Assistance expediting appointments  Time Spent with Patient 30

## 2016-08-06 ENCOUNTER — Telehealth: Payer: Self-pay | Admitting: *Deleted

## 2016-08-06 ENCOUNTER — Encounter: Payer: Self-pay | Admitting: Surgery

## 2016-08-06 NOTE — Telephone Encounter (Signed)
Mailed [facility] letter to pt.  

## 2016-08-06 NOTE — Progress Notes (Signed)
HPI:  Mr. [name] returns today to discuss the results of his recent CT guided needle biopsy of the LLL lung nodule. 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 His PET scan also showed some stable mildly enlarged mediastinal lymph nodes with moderate metabolic activity felt to most likely be reactive by radiology. His PET scan in 12/2011 prior to his surgical resection did not show any hypermetabolic lymph nodes in the mediastinum. 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 He continues to smoke 2 packs of cigarettes per day. He has some chronic shortness of breath and cough that are stable.   Current Outpatient Prescriptions  Medication Sig Dispense Refill  . aspirin EC 81 MG tablet Take 81 mg by mouth daily.    Marland Kitchen atorvastatin (LIPITOR) 40 MG tablet Take 40 mg by mouth daily.    Marland Kitchen diltiazem (CARDIZEM CD) 120 MG 24 hr capsule Take 1 capsule (120 mg total) by mouth daily. 90 capsule 3  . FLUoxetine (PROZAC) 40 MG capsule TAKE ONE CAPSULE BY MOUTH EVERY DAY 90 capsule 1  . gabapentin (NEURONTIN) 300 MG capsule TAKE 1 CAPSULE BY MOUTH THREE TIMES DAILY 270 capsule 1  . insulin aspart (NOVOLOG) 100 UNIT/ML injection Check blood glucose three (3) times daily before meals. CBG < 70: implement hypoglycemia protocol CBG 70 -120: 0 UNITS CBG 121 - 150: 5 UNITS CBG 151 - 200: 10 UNITS CBG 201 - 250: 15 UNITS CBG 251 -300: 20 UNITS CBG 301 - 350: 25 UNITS CBG 351 - 400: 30 UNITS    . ipratropium-albuterol (DUONEB) 0.5-2.5 (3) MG/3ML SOLN Take 3 mLs by nebulization 4 (four) times daily. (Patient taking differently: Take 3 mLs by nebulization every 6 (six) hours as needed. ) 360 mL 11  . LANTUS 100 UNIT/ML injection Inject 22 Units into the skin every morning.   3  . losartan (COZAAR) 25 MG tablet Take 25 mg by mouth daily.    . Multiple  Vitamin (MULTIVITAMIN WITH MINERALS) TABS Take 1 tablet by mouth daily.    . nitroGLYCERIN (NITROSTAT) 0.4 MG SL tablet Place 0.4 mg under the tongue every 5 (five) minutes as needed for chest pain.    . ranitidine (ZANTAC) 150 MG capsule Take 1 capsule (150 mg total) by mouth 2 (two) times daily. 60 capsule 11  . tiZANidine (ZANAFLEX) 4 MG tablet Take 8 mg by mouth 2 (two) times daily.    Marland Kitchen topiramate (TOPAMAX) 50 MG tablet Take 1 tablet (50 mg total) by mouth 2 (two) times daily. 360 tablet 0  . traZODone (DESYREL) 100 MG tablet TAKE ONE TABLET BY MOUTH EVERY NIGHT AT BEDTIME 90 tablet 1   No current facility-administered medications for this visit.      Physical Exam: BP 130/64 (BP Location: Right Arm, Patient Position: Sitting, Cuff Size: Normal)   Pulse 84   Resp 20   Ht '5\' 11"'$  (1.803 m)   Wt 204 lb (92.5 kg)   SpO2 96% Comment: RA  BMI 28.45 kg/m  He looks well There is no cervical or supraclavicular adenopathy.  Lung exam is clear.  The right thoracotomy scar looks good. There are no skin lesions.  Cardiac exam shows a regular rate and rhythm with normal heart sounds.   Diagnostic Tests:  None today  Impression:  He has a 1.3 x 0.8 cm nodule  in the superior segment of the LLL that is non-small cell carcinoma, consistent with squamous cell. This is the same type of cancer he had in the right lung in 2013. He has continued to smoke heavily since his surgery in 2013 and I think this is most likely a new primary. It is unclear if the lymph nodes in the mediastinum are reactive or metastatic disease. He is not an operative candidate. He has severe COPD, ongoing heavy smoking, and a complex medical history that would make his operative risk prohibitive. He almost died after his initial lung cancer surgery with pneumonia, vent dependent respiratory failure, renal failure requiring CRRT and a prolonged recovery. I think his best option is going to be XRT. Mediastinoscopy could be  done to evaluate the lymph nodes if it would change the treatment plan.  Plan:  He will be referred to [facility] clinic for evaluation for non-surgical therapy.   [name], MD [facility] [phone]

## 2016-08-09 ENCOUNTER — Ambulatory Visit
Admission: RE | Admit: 2016-08-09 | Discharge: 2016-08-09 | Disposition: A | Discharge status: Home / Self Care | Payer: PPO | Source: Ambulatory Visit | Attending: Radiation Oncology | Admitting: Radiation Oncology

## 2016-08-09 ENCOUNTER — Ambulatory Visit: Payer: PPO | Attending: Radiation Oncology | Admitting: Physical Therapy

## 2016-08-09 ENCOUNTER — Encounter: Payer: Self-pay | Admitting: *Deleted

## 2016-08-09 VITALS — BP 118/56 | HR 81 | Temp 98.3°F | Resp 18 | Wt 200.9 lb

## 2016-08-09 DIAGNOSIS — R293 Abnormal posture: Secondary | ICD-10-CM | POA: Insufficient documentation

## 2016-08-09 DIAGNOSIS — C3432 Malignant neoplasm of lower lobe, left bronchus or lung: Secondary | ICD-10-CM | POA: Insufficient documentation

## 2016-08-09 DIAGNOSIS — R2681 Unsteadiness on feet: Secondary | ICD-10-CM | POA: Diagnosis not present

## 2016-08-09 DIAGNOSIS — R29898 Other symptoms and signs involving the musculoskeletal system: Secondary | ICD-10-CM | POA: Insufficient documentation

## 2016-08-09 DIAGNOSIS — C3411 Malignant neoplasm of upper lobe, right bronchus or lung: Secondary | ICD-10-CM | POA: Diagnosis not present

## 2016-08-09 DIAGNOSIS — M545 Low back pain: Secondary | ICD-10-CM | POA: Insufficient documentation

## 2016-08-09 DIAGNOSIS — G8929 Other chronic pain: Secondary | ICD-10-CM | POA: Insufficient documentation

## 2016-08-09 NOTE — Progress Notes (Signed)
Oncology Nurse Navigator Documentation  Oncology Nurse Navigator Flowsheets 08/09/2016  Navigator Encounter Type Clinic/MDC/I spoke to patient and wife today at [facility].  I gave and explained information on lung cancer, resource and support services at cancer center, fall risk information and smoking cessation.   I updated [facility] office on patient needing appt for fiducial placement. I was given an appt for 08/16/16 arrive at 3:45.  I gave this information with Dr. Leonarda Salon card.  They verbalized understanding of appt time and place.   Abnormal Finding Date 07/04/2016  Confirmed Diagnosis Date 07/30/2016  Patient Visit Type MedOnc  Treatment Phase Pre-Tx/Tx Discussion  Barriers/Navigation Needs Education  Education Smoking cessation;Other  Interventions Education Method  Education Method Verbal;Written  Acuity Level 2  Acuity Level 2 Educational needs  Time Spent with Patient 30

## 2016-08-09 NOTE — Therapy (Signed)
[place], [place], [address] Phone: [phone]   Fax:  [phone]  Physical Therapy Evaluation  Patient Details  Name: [name] MRN: [id number] Date of Birth: June 04, 1951 Referring Provider: Tyler Pita, MD  Encounter Date: 08/09/2016      PT End of Session - 08/09/16 1605    Visit Number 1   Number of Visits 1   PT Start Time 1528   PT Stop Time 1552   PT Time Calculation (min) 24 min   Activity Tolerance Patient tolerated treatment well   Behavior During Therapy [facility] for tasks assessed/performed      Past Medical History:  Diagnosis Date  . Allergy   . Anemia   . Anginal pain ([place])   . Arthritis    "hands"  . Asthma   . Back pain    4 deteriorating disc and receives an injection q3-51mo;has been doing this for about 459yr . Blood transfusion    as a child  . Bronchitis   . Cancer ([facility]   Non-small cell lung cancer  . Chronic kidney disease    acute kidney failure post surgery  . COPD (chronic obstructive pulmonary disease) (H[facility])    uses Albuterol and Spiriva daily  . Coronary artery disease    has 1 stent  . Depression    takes Prozac daily  . Emphysema    sees Dr.Ramaswami for this  . GERD (gastroesophageal reflux disease)    takes Prilosec daily  . Glaucoma    hx of  . H/O hiatal hernia   . Headache(784.0)   . Hypertension    takes  HYzaar daily  . Insomnia    takes Trazodone nightly  . Lung mass    right upper lobe  . Myocardial infarct   . Obesity   . Parathyroid disease ([facility]  . Periodic limb movements of sleep   . [place]ipheral neuropathy [place]  . Pneumonia    hx of' 65 yo,rd' last time in 2012  . Productive cough    white in color but no odor  . Shortness of breath    with exertion   . Sleep apnea    uses BiPaP  . Syncope and collapse 05/26/2013  . Thyroid disease   . Type II diabetes mellitus (HCC)    takes Metformin bid and Novolog and  Lantus daily    Past Surgical History:  Procedure Laterality Date  . CARDIAC CATHETERIZATION  2006/2008/2009  . CARPAL TUNNEL RELEASE     bilateral  . COLONOSCOPY    . CORONARY ANGIOPLASTY WITH STENT PLACEMENT     1 stent  . ELBOW SURGERY     ulnar nerve; left  . ESOPHAGOGASTRODUODENOSCOPY    . EYE SURGERY    . INGUINAL HERNIA REPAIR  1990's   double inguinal  . LOBECTOMY  03/17/2012   upper right side with resection  . REFRACTIVE SURGERY     bilaterally  . ROTATOR CUFF REPAIR     left    There were no vitals filed for this visit.       Subjective Assessment - 08/09/16 1553    Subjective "I have had back pain for about 4-5 years. That's why I'm not working."   Patient is accompained by: Family member  wife   Pertinent History Patient diagnosed with early stage LLL squamous cell carcinoma, 13 mm mass. Current plan is SBRT. Patient is a smoker with history of squamous  cell carcinoma of RUL s/p lobectomy (03/2012), left elbow surgery, chronic back pain, coronary stent, COPD, diabetes with peripheral neuropathy, and orthostatic hypotension.   Patient Stated Goals to obtain information from lung multidisciplinary clinic providers   Currently in Pain? Yes   Pain Score 4    Pain Location Back   Pain Type Chronic pain   Pain Onset Other (comment)  4-5 years   Pain Frequency Constant   Aggravating Factors  work   Pain Relieving Factors pain medication            [facility] PT Assessment - 08/09/16 0001      Assessment   Medical Diagnosis LLL squamous cell carcinoma   Referring Provider Tyler Pita, MD   Prior Therapy none     Precautions   Precaution Comments active cancer     Restrictions   Weight Bearing Restrictions No     Balance Screen   Has the patient fallen in the past 6 months No   Has the patient had a decrease in activity level because of a fear of falling?  No   Is the patient reluctant to leave their home because of a fear of falling?  No     Home  [place] residence   Living Arrangements Spouse/significant other   Type of [place] to enter   Entrance Stairs-Number of Steps 2   [place] One level     Prior Function   Level of [place] Retired   Leisure no regular exercise     Cognition   Overall Cognitive Status Within Functional Limits for tasks assessed     Functional Tests   Functional tests Sit to Stand     Sit to Stand   Comments able to perform 11 repetitions during 30 second sit to stand, slightly below average for age; reported dyspnea after     Posture/Postural Control   Posture/Postural Control Postural limitations   Postural Limitations Rounded Shoulders;Forward head;Increased thoracic kyphosis     ROM / Strength   AROM / PROM / Strength AROM     AROM   Overall AROM Comments trunk AROM WFL for flexion and extension, 50% limited in bil sidebending and 25% limited in bil rotation     Ambulation/Gait   Ambulation/Gait Yes   Ambulation/Gait Assistance 7: Independent     Balance   Balance Assessed Yes     Dynamic Standing Balance   Dynamic Standing - Comments able to reach 6.5 inches during standing Functional Reach, below average for age                           PT Education - 08/09/16 1603    Education provided Yes   Education Details energy conservation, walking program, Cure article, posture, deep breathing, PT information   Person(s) Educated Patient;Spouse   Methods Explanation;Demonstration;Handout   Comprehension Verbalized understanding;Returned demonstration               Lung Clinic Goals - 08/09/16 1610      Patient will be able to verbalize understanding of the benefit of exercise to decrease fatigue.   Status Achieved     Patient will be able to verbalize the importance of posture.   Status Achieved     Patient will be able to demonstrate diaphragmatic breathing for  improved lung function.   Status Achieved  Patient will be able to verbalize understanding of the role of physical therapy to prevent functional decline and who to contact if physical therapy is needed.   Status Achieved             Plan - 08/09/16 1605    Clinical Impression Statement Patient is a 65 year old male diagnosed with early stage LLL squamous cell carcinoma currently planning on SBRT. Patient has a history of RUL lobectomy in 2013 and chronic back pain. He demonstrates limited trunk AROM, decreased standing balance, postural deficits, and fewer than average repetitions during 30 second sit to stand with SOB. Patient encouraged to inform his MD should he need any further physical therapy.   PT Frequency One time visit   PT Treatment/Interventions ADLs/Self Care Home Management;Patient/family education   PT Next Visit Plan none at this time   Consulted and Agree with Plan of Care Patient      Patient will benefit from skilled therapeutic intervention in order to improve the following deficits and impairments:  Decreased balance, Decreased range of motion, Decreased endurance, Postural dysfunction, Pain  Visit Diagnosis: Unsteadiness on feet  Abnormal posture  Other symptoms and signs involving the musculoskeletal system  Chronic low back pain, unspecified back pain laterality, with sciatica presence unspecified     Problem List Patient Active Problem List   Diagnosis Date Noted  . Stage I squamous cell cancer of left lower lobe of lung ([place]) - 2017 08/09/2016  . CAD (coronary artery disease), native coronary artery 09/18/2015  . Orthostatic hypotension 07/20/2015  . Smoking 02/26/2015  . Neurocardiogenic syncope 05/26/2013  . Seizures ([place]) 05/03/2013  . Chronic kidney disease   . Type II diabetes mellitus ([place])   . OSA on CPAP 09/26/2012  . Neuropathic pain of both legs 04/23/2012  . S/P lobectomy of lung 04/04/2012  . Non-small cell carcinoma of lung,  stage 1 ([place]) 03/17/2012    Class: Stage 1  . Chest pain, atypical 12/21/2011  . Hypertension   . Depression   . GERD (gastroesophageal reflux disease)   . CIGARETTE SMOKER 09/25/2007  . DIZZINESS AND GIDDINESS 09/25/2007  . COPD (chronic obstructive pulmonary disease) ([place]) 08/22/2007    Mellody Life 08/09/2016, 4:11 PM  [place], [place], [address] Phone: [phone]   Fax:  [phone]  Name: [name] MRN: [id number] Date of Birth: 1951/02/28  Saverio Danker, SPT  This entire session was guided, instructed, and directly supervised by Serafina Royals, PT.  Read, reviewed, edited and agree with student's findings and recommendations.   Serafina Royals, PT 08/09/16 4:47 PM

## 2016-08-09 NOTE — Progress Notes (Signed)
Radiation Oncology         [phone] ________________________________  Multidisciplinary [facility]acic [facility]cology [facility]) Initial Outpatient Consultation  Name: [name] MRN: [id number]  Date: 08/09/2016  DOB: July 01, 1951  WF:UXNATFT,DDUKGU TOM, MD  [name], MD   REFERRING PHYSICIAN: [name], MD  DIAGNOSIS: The primary encounter diagnosis was Non-small cell carcinoma of lung, stage 1 ([place]). 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      ICD-9-CM ICD-10-CM   1. Non-small cell carcinoma of lung, stage 1 (HCC) 162.3 C34.11   2. 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 male with a history of a stage I lung right sided NSCLC, squamous cell of the right upper lung. He underwent lobectomy in 2013 with Dr. [name], and has been followed in surveillance since, and has been without evidence of disease. A CT Chest on 07/04/2016 revealed an enlarging superior segment left lower lobe nodule, measuring 1.3 by 0.8 cm. The appearance favored malignancy. Accordingly a PET was performed on 07/18/2016 showing hypermetabolic left lower lobe pulmonary nodule, enlarge over time, consistent with bronchogenic carcinoma. There were also stable mildly enlarged mediastinal lymph nodes with moderate metabolic activity favored as reactive adenopathy from COPD. Also noted stable smal sub 5 mm nodules in the right lung. He underwent CT biopsy on 07/30/2016 with Dr. [name]. Pathology of the left lower lobe shows non-small cell carcinoma, consistent with squamous cell carcinoma.  The patient was referred today for presentation in the multidisciplinary conference.  Radiology studies and pathology slides were presented there for review and discussion of treatment options.  A consensus was discussed regarding potential next  steps.     PREVIOUS RADIATION THERAPY: No  PAST MEDICAL HISTORY:  has a past medical history of Allergy; Anemia; Anginal pain ([place]); Arthritis; Asthma; Back pain; Blood transfusion; Bronchitis; Cancer ([place]); Chronic kidney disease; COPD (chronic obstructive pulmonary disease) ([place]); Coronary artery disease; Depression; Emphysema; GERD (gastroesophageal reflux disease); Glaucoma; H/O hiatal hernia; Headache(784.0); Hypertension; Insomnia; Lung mass; Myocardial infarct; Obesity; Parathyroid disease ([place]); Periodic limb movements of sleep; Peripheral neuropathy ([place]); Pneumonia; Productive cough; Shortness of breath; Sleep apnea; Syncope and collapse (05/26/2013); Thyroid disease; and Type II diabetes mellitus ([place]).    PAST SURGICAL HISTORY: Past Surgical History:  Procedure Laterality Date  . CARDIAC CATHETERIZATION  2006/2008/2009  . CARPAL TUNNEL RELEASE     bilateral  . COLONOSCOPY    . CORONARY ANGIOPLASTY WITH STENT PLACEMENT     1 stent  . ELBOW SURGERY     ulnar nerve; left  . ESOPHAGOGASTRODUODENOSCOPY    . EYE SURGERY    . INGUINAL HERNIA REPAIR  1990's   double inguinal  . LOBECTOMY  03/17/2012   upper right side with resection  . REFRACTIVE SURGERY     bilaterally  . ROTATOR CUFF REPAIR     left    FAMILY HISTORY:  Family History  Problem Relation Age of Onset  . COPD Mother   . Diabetes Mother   . Hypertension Mother   . Heart attack Father   . Diabetes Father   . Anesthesia problems Neg Hx   . Hypotension Neg Hx   . Malignant hyperthermia Neg Hx   . Pseudochol deficiency Neg Hx     SOCIAL HISTORY:  reports  that he has been smoking Cigarettes.  He has a 45.00 pack-year smoking history. He has never used smokeless tobacco. He reports that he drinks alcohol. He reports that he uses drugs, including Cocaine. He is a retired Administrator and smokes 2 ppd, he used to smoke up to 4ppd. He lives in [place] and is accompanied by his wife.  ALLERGIES: Actos  [pioglitazone]; Avandia [rosiglitazone]; and Dilaudid [hydromorphone hcl]  MEDICATIONS:  Current Outpatient Prescriptions  Medication Sig Dispense Refill  . aspirin EC 81 MG tablet Take 81 mg by mouth daily.    Marland Kitchen atorvastatin (LIPITOR) 40 MG tablet Take 40 mg by mouth daily.    Marland Kitchen diltiazem (CARDIZEM CD) 120 MG 24 hr capsule Take 1 capsule (120 mg total) by mouth daily. 90 capsule 3  . FLUoxetine (PROZAC) 40 MG capsule TAKE ONE CAPSULE BY MOUTH EVERY DAY 90 capsule 1  . gabapentin (NEURONTIN) 300 MG capsule TAKE 1 CAPSULE BY MOUTH THREE TIMES DAILY 270 capsule 1  . insulin aspart (NOVOLOG) 100 UNIT/ML injection Check blood glucose three (3) times daily before meals. CBG < 70: implement hypoglycemia protocol CBG 70 -120: 0 UNITS CBG 121 - 150: 5 UNITS CBG 151 - 200: 10 UNITS CBG 201 - 250: 15 UNITS CBG 251 -300: 20 UNITS CBG 301 - 350: 25 UNITS CBG 351 - 400: 30 UNITS    . ipratropium-albuterol (DUONEB) 0.5-2.5 (3) MG/3ML SOLN Take 3 mLs by nebulization 4 (four) times daily. (Patient taking differently: Take 3 mLs by nebulization every 6 (six) hours as needed. ) 360 mL 11  . LANTUS 100 UNIT/ML injection Inject 22 Units into the skin every morning.   3  . losartan (COZAAR) 25 MG tablet Take 25 mg by mouth daily.    . Multiple Vitamin (MULTIVITAMIN WITH MINERALS) TABS Take 1 tablet by mouth daily.    . nitroGLYCERIN (NITROSTAT) 0.4 MG SL tablet Place 0.4 mg under the tongue every 5 (five) minutes as needed for chest pain.    . ranitidine (ZANTAC) 150 MG capsule Take 1 capsule (150 mg total) by mouth 2 (two) times daily. 60 capsule 11  . tiZANidine (ZANAFLEX) 4 MG tablet Take 8 mg by mouth 2 (two) times daily.    Marland Kitchen topiramate (TOPAMAX) 50 MG tablet Take 1 tablet (50 mg total) by mouth 2 (two) times daily. 360 tablet 0  . traZODone (DESYREL) 100 MG tablet TAKE ONE TABLET BY MOUTH EVERY NIGHT AT BEDTIME 90 tablet 1   No current facility-administered medications for this encounter.     REVIEW  OF SYSTEMS:  On review of systems, the patient reports that he is doing well overall. He jokes throughout our visit. He denies any chest pain, shortness of breath, fevers, chills, night sweats. He denies any bowel or bladder disturbances, and denies abdominal pain, nausea or vomiting. He denies any new musculoskeletal or joint aches or pains. He has lost about 100 lbs over the last 3 years, attributing this to eating less. He reports cough with sputum production. He denies hemoptysis. He continues to smoke about 3-4 ppd. 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 A complete review of systems is obtained and is otherwise negative.   PHYSICAL EXAM:  Wt Readings from Last 3 Encounters:  08/09/16 200 lb 14.4 oz (91.1 kg)  08/01/16 204 lb (92.5 kg)  07/30/16 204 lb (92.5 kg)   Temp Readings from Last 3 Encounters:  08/09/16 98.3 F (36.8 C)  07/30/16 98 F (36.7  C) (Oral)  02/23/16 (!) 96.5 F (35.8 C)   BP Readings from Last 3 Encounters:  08/09/16 (!) 118/56  08/01/16 130/64  07/30/16 111/65   Pulse Readings from Last 3 Encounters:  08/09/16 81  08/01/16 84  07/30/16 64   Pain scale: 0/10 In general this is a well appearing Caucasian male in no acute distress. He is alert and oriented x4 and appropriate throughout the examination. HEENT reveals that the patient is normocephalic, atraumatic. EOMs are intact. PERRLA. Skin is intact without any evidence of gross lesions. Cardiovascular exam reveals a regular rate and rhythm, no clicks rubs or murmurs are auscultated. Chest is clear to auscultation bilaterally. Lymphatic assessment is performed and does not reveal any adenopathy in the cervical, supraclavicular, axillary, or inguinal chains. Abdomen has active bowel sounds in all quadrants and is intact. The abdomen is soft, non tender, non distended. Lower extremities are negative for pretibial pitting edema, deep calf tenderness, cyanosis or clubbing.  KPS = 90  100 - Normal; no  complaints; no evidence of disease. 90   - Able to carry on normal activity; minor signs or symptoms of disease. 80   - Normal activity with effort; some signs or symptoms of disease. 53   - Cares for self; unable to carry on normal activity or to do active work. 60   - Requires occasional assistance, but is able to care for most of his personal needs. 50   - Requires considerable assistance and frequent medical care. 59   - Disabled; requires special care and assistance. 80   - Severely disabled; hospital admission is indicated although death not imminent. 7   - Very sick; hospital admission necessary; active supportive treatment necessary. 10   - Moribund; fatal processes progressing rapidly. 0     - Dead  Karnofsky DA, Abelmann [place], Craver LS and Burchenal JH [phone]) The use of the nitrogen mustards in the palliative treatment of carcinoma: with particular reference to bronchogenic carcinoma Cancer 1 634-56  LABORATORY DATA:  Lab Results  Component Value Date   WBC 8.8 07/30/2016   HGB 13.0 07/30/2016   HCT 38.6 (L) 07/30/2016   MCV 91.9 07/30/2016   PLT 203 07/30/2016   Lab Results  Component Value Date   NA 138 05/25/2014   K 4.6 05/25/2014   CL 105 05/25/2014   CO2 21 05/25/2014   Lab Results  Component Value Date   ALT 15 01/22/2014   AST 10 01/22/2014   ALKPHOS 77 01/22/2014   BILITOT 0.3 01/22/2014    RADIOGRAPHY: Dg Chest 1 View  Result Date: 07/30/2016 CLINICAL DATA:  65 year old male status post left lower lobe CT-guided needle biopsy today. Initial encounter. EXAM: CHEST 1 VIEW COMPARISON:  CT guided biopsy images 0 846 hours today, and earlier. FINDINGS: Portable AP semi upright view at 1120 hours. No pneumothorax or pleural effusion identified. Stable cardiac size and mediastinal contours. Surgical clip at the right hilum again noted. Lung markings appear stable compared to the scout view a recent chest CT on 07/04/2016, with mildly increased interstitial  markings diffusely compared to prior chest radiograph 05/25/2014. IMPRESSION: No pneumothorax or adverse features identified status post left lung CT-guided biopsy. Electronically Signed   By: Genevie Ann M.D.   On: 07/30/2016 11:29   Nm Pet Image Initial (pi) Skull Base To Thigh  Result Date: 07/18/2016 CLINICAL DATA:  Subsequent treatment strategy for lung carcinoma. Patient status post RIGHT thoracotomy with RIGHT upper lobe lobectomy and mediastinal nodal  dissection for T1a N0 squamous cell carcinoma. Enlarging LEFT lower lobe pulmonary nodule. EXAM: NUCLEAR MEDICINE PET SKULL BASE TO THIGH TECHNIQUE: 9.3 mCi F-18 FDG was injected intravenously. Full-ring PET imaging was performed from the skull base to thigh after the radiotracer. CT data was obtained and used for attenuation correction and anatomic localization. FASTING BLOOD GLUCOSE:  Value: 119 mg/dl COMPARISON:  PET-CT 01/02/2012, CT thorax 07/04/2016, 12/28/2015 FINDINGS: NECK No hypermetabolic lymph nodes in the neck. CHEST Within the superior segment of the LEFT lower lobe, nodule of concern measures 13 mm (image 81, series 4) and has intense metabolic activity for size (SUV max equal 9.9). RIGHT lower paratracheal lymph node measures 10 mm short axis SUV max equal 4.6. LEFT lower paratracheal lymph node measures 9 mm with SUV max equal 3.6. These lymph nodes are stable in size over time. Several smaller nodules within the LEFT lung RIGHT lung are stable. These do not have associated metabolic activity but are below the size limit for accurate PET characterization. Example nodule measuring 4 mm in the RIGHT upper lobe on image 57, series 4. RIGHT middle lobe nodule measuring 4 mm on image 84, series 4. Nodule in the RIGHT lower lobe measuring 4 mm on image 85, series 4. ABDOMEN/PELVIS No abnormal hypermetabolic activity within the liver, pancreas, adrenal glands, or spleen. No hypermetabolic lymph nodes in the abdomen or pelvis. Dense atherosclerotic  calcification abdominal aorta. SKELETON No focal hypermetabolic activity to suggest skeletal metastasis. IMPRESSION: 1. Hypermetabolic LEFT lower lobe pulmonary nodule, enlarged over time, is consistent with BRONCHOGENIC CARCINOMA. 2. Stable mildly enlarged mediastinal lymph nodes with moderate metabolic activity are favored reactive adenopathy from COPD. 3. Stable small sub 5 mm nodules in the RIGHT lung. Electronically Signed   By: Suzy Bouchard M.D.   On: 07/18/2016 15:25   Ct Biopsy  Result Date: 07/30/2016 INDICATION: 65 year old male with an enlarging hypermetabolic left lower lobe pulmonary nodule concerning for primary bronchogenic carcinoma EXAM: CT-guided biopsy left lower lobe lobe pulmonary nodule Interventional Radiologist:  Criselda Peaches, MD MEDICATIONS: None. ANESTHESIA/SEDATION: Fentanyl 50 mcg IV; Versed 1 mg IV Moderate Sedation Time:  15 minutes The patient was continuously monitored during the procedure by the interventional radiology nurse under my direct supervision. FLUOROSCOPY TIME:  Fluoroscopy Time: 0 minutes 0 seconds (0 mGy). COMPLICATIONS: None immediate. Estimated blood loss:  0 PROCEDURE: Informed written consent was obtained from the patient after a thorough discussion of the procedural risks, benefits and alternatives. All questions were addressed. Maximal Sterile Barrier Technique was utilized including caps, mask, sterile gowns, sterile gloves, sterile drape, hand hygiene and skin antiseptic. A timeout was performed prior to the initiation of the procedure. A planning axial CT scan was performed. The nodule in the left lower lobe was successfully identified. A suitable skin entry site was selected and marked. The region was then sterilely prepped and draped in standard fashion using Betadine skin prep. Local anesthesia was attained by infiltration with 1% lidocaine. A small dermatotomy was made. Under intermittent CT fluoroscopic guidance, a 17 gauge trocar needle was  advanced into the lung and positioned at the margin of the nodule. Multiple 18 gauge core biopsies were then coaxially obtained using the BioPince automated biopsy device. Biopsy specimens were placed in formalin and delivered to pathology for further analysis. The biopsy device and introducer needle were removed. Post biopsy axial CT imaging demonstrates no evidence of immediate complication. There is no pneumothorax. Mild perilesional alveolar hemorrhage is not unexpected. The patient tolerated the  procedure well. IMPRESSION: Technically successful CT-guided biopsy left lower lobe pulmonary nodule. Signed, Criselda Peaches, MD [facility] Rad[facility] Electronically Signed   By: [name] M.D.   On: 07/30/2016 14:19    IMPRESSION/PLAN:  14. 65 year-old with Stage IA, T1aN0 squamous cell carcinoma of the left lower lobe of lung Today, we spoke with the patient and family about the findings and work-up thus far.  We discussed the natural history of non-small cell lung cancer and general treatment, highlighting the role of radiotherapy in the management.  We discussed the available radiation techniques, and focused on the details of logistics and delivery.  We reviewed the anticipated acute and late sequelae associated with radiation in this setting. We discussed the need to set up fiducial marker placement, and Dr. [name] told the patient that he would recommend Dr. Roxan Hockey do this procedure. We will follow up with this, and the patient would like to proceed with radiation and will be scheduled for CT simulation following fiducial marker placement.  2. Smoking cessation. We discussed smoking cessation medications and moderate decrease in smoking amount over time. He will consider this and make changes to his tobacco use. We will continue to follow this.    I spent 60 minutes minutes face to face with the patient and more than 50% of that time was  spent in counseling and/or coordination of care.    Carola Rhine, PAC   This document serves as a record of services personally performed by Tyler Pita, MD and Shona Simpson, PAC. It was created on his behalf by Arlyce Harman, a trained medical scribe. The creation of this record is based on the scribe's personal observations and the provider's statements to them. This document has been checked and approved by the attending provider.

## 2016-08-13 ENCOUNTER — Other Ambulatory Visit: Payer: Self-pay | Admitting: Family Medicine

## 2016-08-16 ENCOUNTER — Encounter: Payer: Self-pay | Admitting: Thoracic Surgery (Cardiothoracic Vascular Surgery)

## 2016-08-16 ENCOUNTER — Ambulatory Visit (INDEPENDENT_AMBULATORY_CARE_PROVIDER_SITE_OTHER): Payer: PPO | Admitting: Thoracic Surgery (Cardiothoracic Vascular Surgery)

## 2016-08-16 ENCOUNTER — Telehealth: Payer: Self-pay | Admitting: Radiation Oncology

## 2016-08-16 VITALS — BP 100/59 | HR 78 | Resp 16 | Ht 71.0 in | Wt 200.0 lb

## 2016-08-16 DIAGNOSIS — Z85118 Personal history of other malignant neoplasm of bronchus and lung: Secondary | ICD-10-CM | POA: Diagnosis not present

## 2016-08-16 DIAGNOSIS — Z902 Acquired absence of lung [part of]: Secondary | ICD-10-CM | POA: Diagnosis not present

## 2016-08-16 DIAGNOSIS — C3432 Malignant neoplasm of lower lobe, left bronchus or lung: Secondary | ICD-10-CM

## 2016-08-16 NOTE — Progress Notes (Signed)
PCP is Odette Fraction, MD Referring Provider is Brand Males, MD  Chief Complaint  Patient presents with  . 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  [name] is a 65 year old gentleman history of tobacco abuse and COPD. He also has a history of coronary artery disease, COPD, anemia, chronic kidney disease, type 2 diabetes, complicated by neuropathy, depression, reflux, glaucoma, and multiple other issues. He had a right upper lobectomy and node dissection on 03/17/2012 for a stage IA squamous cell carcinoma. He continued to smoke 2 packs of cigarettes even after that operation. He recently saw Dr. [name] in follow-up. A CT chest showed an enlarging nodule in the superior segment of the left lower lobe. A PET CT showed the nodule was hypermetabolic. There was some mild mediastinal adenopathy thought to be reactive and related to COPD. A CT-guided biopsy was positive for squamous cell carcinoma.  Dr. [name] did not feel he was a candidate for surgical resection due to his comorbidities. He saw Dr. Tammi Klippel radiation oncology. He was advised to have stereotactic radiation. Dr. Tammi Klippel wishes to have fiducials placed to assist with radiation.   Past Medical History:  Diagnosis Date  . Allergy   . Anemia   . Anginal pain ([place])   . Arthritis    "hands"  . Asthma   . Back pain    4 deteriorating disc and receives an injection q3-37mo;has been doing this for about 43yr . Blood transfusion    as a child  . Bronchitis   . Cancer ([facility]   Non-small cell lung cancer  . Chronic kidney disease    acute kidney failure post surgery  . COPD (chronic obstructive pulmonary disease) (HCC)    uses Albuterol and Spiriva daily  . Coronary artery disease    has 1 stent  . Depression    takes Prozac daily  . Emphysema    sees Dr.Ramaswami for this  . GERD  (gastroesophageal reflux disease)    takes Prilosec daily  . Glaucoma    hx of  . H/O hiatal hernia   . Headache(784.0)   . Hypertension    takes  HYzaar daily  . Insomnia    takes Trazodone nightly  . Lung mass    right upper lobe  . Myocardial infarct   . Obesity   . Parathyroid disease ([facility]CE[facility]  . Periodic limb movements of sleep   . Peripheral neuropathy ([facility] [place]  . Pneumonia    hx of' 65 yo,rd' last time in 2012  . Productive cough    white in color but no odor  . Shortness of breath    with exertion   . Sleep apnea    uses BiPaP  . Syncope and collapse 05/26/2013  . Thyroid disease   . Type II diabetes mellitus (HCC)    takes Metformin bid and Novolog and Lantus daily    Past Surgical History:  Procedure Laterality Date  . CARDIAC CATHETERIZATION  2006/2008/2009  . CARPAL TUNNEL RELEASE     bilateral  . COLONOSCOPY    . CORONARY ANGIOPLASTY WITH STENT PLACEMENT     1 stent  . ELBOW SURGERY     ulnar nerve; left  . ESOPHAGOGASTRODUODENOSCOPY    . EYE SURGERY    . INGUINAL HERNIA REPAIR  1990's   double inguinal  . LOBECTOMY  03/17/2012  upper right side with resection  . REFRACTIVE SURGERY     bilaterally  . ROTATOR CUFF REPAIR     left    Family History  Problem Relation Age of Onset  . COPD Mother   . Diabetes Mother   . Hypertension Mother   . Heart attack Father   . Diabetes Father   . Anesthesia problems Neg Hx   . Hypotension Neg Hx   . Malignant hyperthermia Neg Hx   . Pseudochol deficiency Neg Hx     Social History Social History  Substance Use Topics  . Smoking status: Current Every Day Smoker    Packs/day: 1.00    Years: 45.00    Types: Cigarettes    Last attempt to quit: 03/26/2012  . Smokeless tobacco: Never Used  . Alcohol use Yes     Comment: gallon per week liquor "Haven't drink in 6 weeks"    Current Outpatient Prescriptions  Medication Sig Dispense Refill  . aspirin EC 81 MG tablet Take 81 mg by mouth daily.    Marland Kitchen  atorvastatin (LIPITOR) 40 MG tablet Take 40 mg by mouth daily.    Marland Kitchen diltiazem (CARDIZEM CD) 120 MG 24 hr capsule Take 1 capsule (120 mg total) by mouth daily. 90 capsule 3  . FLUoxetine (PROZAC) 40 MG capsule TAKE ONE CAPSULE BY MOUTH EVERY DAY 90 capsule 1  . gabapentin (NEURONTIN) 300 MG capsule TAKE 1 CAPSULE BY MOUTH THREE TIMES DAILY 270 capsule 1  . insulin aspart (NOVOLOG) 100 UNIT/ML injection Check blood glucose three (3) times daily before meals. CBG < 70: implement hypoglycemia protocol CBG 70 -120: 0 UNITS CBG 121 - 150: 5 UNITS CBG 151 - 200: 10 UNITS CBG 201 - 250: 15 UNITS CBG 251 -300: 20 UNITS CBG 301 - 350: 25 UNITS CBG 351 - 400: 30 UNITS    . ipratropium-albuterol (DUONEB) 0.5-2.5 (3) MG/3ML SOLN Take 3 mLs by nebulization 4 (four) times daily. (Patient taking differently: Take 3 mLs by nebulization every 6 (six) hours as needed. ) 360 mL 11  . LANTUS 100 UNIT/ML injection Inject 15 Units into the skin every morning.   3  . losartan (COZAAR) 25 MG tablet Take 25 mg by mouth daily.    . Multiple Vitamin (MULTIVITAMIN WITH MINERALS) TABS Take 1 tablet by mouth daily.    . nitroGLYCERIN (NITROSTAT) 0.4 MG SL tablet Place 0.4 mg under the tongue every 5 (five) minutes as needed for chest pain.    . ranitidine (ZANTAC) 150 MG capsule Take 1 capsule (150 mg total) by mouth 2 (two) times daily. 60 capsule 11  . tiZANidine (ZANAFLEX) 4 MG tablet Take 8 mg by mouth 2 (two) times daily.    Marland Kitchen topiramate (TOPAMAX) 50 MG tablet Take 1 tablet (50 mg total) by mouth 2 (two) times daily. 360 tablet 0  . traZODone (DESYREL) 100 MG tablet TAKE ONE TABLET BY MOUTH EVERY NIGHT AT BEDTIME 90 tablet 1   No current facility-administered medications for this visit.     Allergies  Allergen Reactions  . Actos [Pioglitazone] Other (See Comments)    edema  . Avandia [Rosiglitazone] Other (See Comments)    edema  . Dilaudid [Hydromorphone Hcl]     Made him crazy    Review of Systems   Constitutional: Negative for chills and fever.  Respiratory: Positive for cough and shortness of breath.   Cardiovascular: Negative for chest pain.  Musculoskeletal: Negative for arthralgias.    BP (!) 100/59  Pulse 78   Resp 16   Ht '5\' 11"'$  (1.803 m)   Wt 200 lb (90.7 kg)   SpO2 98% Comment: ON RA  BMI 27.89 kg/m  Physical Exam  Constitutional: He is oriented to person, place, and time. No distress.  Obese  HENT:  Head: Normocephalic and atraumatic.  Eyes: EOM are normal.  Neck: Neck supple. No thyromegaly present.  Cardiovascular: Normal rate, regular rhythm and normal heart sounds.   Pulmonary/Chest: Effort normal.  Faint rhonchi  Abdominal: Soft. He exhibits no distension. There is no tenderness.  Musculoskeletal: He exhibits no edema.  Lymphadenopathy:    He has no cervical adenopathy.  Neurological: He is alert and oriented to person, place, and time. No cranial nerve deficit.  Skin: Skin is warm and dry.  Vitals reviewed.    Diagnostic Tests:  CT CHEST WITHOUT CONTRAST  TECHNIQUE: Multidetector CT imaging of the chest was performed following the standard protocol without IV contrast.  COMPARISON:  12/28/2015  FINDINGS: Cardiovascular: Coronary, aortic arch, and branch vessel atherosclerotic vascular disease. Coronary artery stents noted.  Mediastinum/Nodes: Right lower paratracheal lymph node 1.0 cm in short axis on image 42/3, stable. AP window lymph node 0.9 cm in short axis on image 41/3, stable. Lymph node lateral to the left mainstem bronchus 1.2 cm in short axis on image 48/3, stable.  Lungs/Pleura: Right upper lobe wedge resection. 5 mm in long axis right upper lobe nodule on image 24/4, no change from 10/07/2012. Scarring posteriorly in the right upper lobe, likewise unchanged 2013. Several tiny peripheral nodules in the right upper lobe are likewise unchanged.  In the superior segment left lower lobe, a 1.3 by 0.8 cm solid nodule is  present on image 63/4. This lesion is not readily apparent on the 2014 exam and measured 7 by 5 mm on 05/26/2014 and 7 by 8 mm on 12/28/2015. The increasing size of this nodule is compatible with malignancy.  Peripheral scarring in the posterior basal segments of both lower lobes. Mosaic attenuation in the lung bases, right greater than left the. Peripheral emphysema.  Upper Abdomen: Mild fullness of the adrenal glands without overt mass. Hypodense lesion in the left kidney upper pole, nonspecific.  Musculoskeletal: Thoracic spondylosis.  IMPRESSION: 1. Enlarging superior segment left lower lobe nodule, currently measuring 1.3 by 0.8 cm. The appearance favors malignancy. 2. Stable borderline mediastinal adenopathy. 3. Coronary, aortic arch, and branch vessel atherosclerotic vascular disease. 4. Mild mosaic attenuation in the lungs, possibly from air trapping. Emphysema.   Electronically Signed   By: Van Clines M.D.   On: 07/04/2016 10:41 I personally reviewed the CT chest and concur with the findings noted above.  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 He now has a new lesion in the superior segment of the left lower lobe that appears to be a second primary. It was biopsied and proven to be squamous cell carcinoma. He is not a candidate for surgical resection but is a candidate for stereotactic radiation. Dr. Tammi Klippel wishes to have fiducials placed to assist with radiation planning and treatment.  I discussed navigational bronchoscopy and fiducial placement with [name] and his wife. I described the general nature of the procedure to him. He understands and would be done under general anesthesia on an outpatient basis. I reviewed the indications, risks, benefits, and alternatives. He understands the risk include those associated with general anesthesia  such as  stroke or MI, as well as procedure specific risks such as bleeding, pneumothorax, or fiducial malposition.  He accepts the risks and agrees to proceed.  Plan: Navigational bronchoscopy with fiducial placement on Friday, October 20  Melrose Nakayama, MD [facility] [phone]

## 2016-08-16 NOTE — Telephone Encounter (Signed)
The patient is going to meet with Dr. Roxan Hockey today to discuss fiducial marker placement. His wife will notify us of when that is scheduled so we can proceed with simulation.

## 2016-08-17 ENCOUNTER — Other Ambulatory Visit: Payer: Self-pay | Admitting: *Deleted

## 2016-08-17 ENCOUNTER — Ambulatory Visit (INDEPENDENT_AMBULATORY_CARE_PROVIDER_SITE_OTHER): Payer: PPO | Admitting: Family Medicine

## 2016-08-17 ENCOUNTER — Encounter: Payer: Self-pay | Admitting: Family Medicine

## 2016-08-17 VITALS — BP 132/68 | HR 82 | Temp 99.2°F | Resp 18 | Ht 71.0 in | Wt 202.0 lb

## 2016-08-17 DIAGNOSIS — Z23 Encounter for immunization: Secondary | ICD-10-CM

## 2016-08-17 DIAGNOSIS — F17200 Nicotine dependence, unspecified, uncomplicated: Secondary | ICD-10-CM | POA: Diagnosis not present

## 2016-08-17 DIAGNOSIS — J438 Other emphysema: Secondary | ICD-10-CM | POA: Diagnosis not present

## 2016-08-17 DIAGNOSIS — C3490 Malignant neoplasm of unspecified part of unspecified bronchus or lung: Secondary | ICD-10-CM | POA: Diagnosis not present

## 2016-08-17 DIAGNOSIS — C3432 Malignant neoplasm of lower lobe, left bronchus or lung: Secondary | ICD-10-CM

## 2016-08-17 NOTE — Addendum Note (Signed)
Addended by: Shary Decamp B on: 08/17/2016 02:18 PM   Modules accepted: Orders

## 2016-08-17 NOTE — Progress Notes (Signed)
Subjective:    Patient ID: ZI [place], male    DOB: January 25, 1951, 65 y.o.   MRN: [id number]  HPI  The patient is a very pleasant 65 year old white male who has underlying history of lung cancer status post resection of the upper lobe out of his right lung. In 2013.  He has underlying history of severe COPD.Marland Kitchen Unfortunately he continues to smoke. Recently on his surveillance CAT scans, the patient was found to have a small nodule in his left lung. On PET scan this was confirmed to be malignancy. Fortunately this was called earlier. They believe they can treat this with localized radiation therapy. Next week he is scheduled to go when for marker placement and then will undergo 3 sessions of intense radiation therapy. I explained the patient he really needs to quit smoking. My biggest concern for him his postoperative pneumonia and COPD exacerbation. He is due for his flu shot. I definitely want him to get this before he undergoes treatment. Currently he is on no breathing medication to help manage his emphysema due to cost. His blood pressure today is well controlled and his heart rate is well controlled Past Medical History:  Diagnosis Date  . Allergy   . Anemia   . Anginal pain ([place])   . Arthritis    "hands"  . Asthma   . Back pain    4 deteriorating disc and receives an injection q3-80mo;has been doing this for about 437yr . Blood transfusion    as a child  . Bronchitis   . Cancer ([facility]   Non-small cell lung cancer  . Chronic kidney disease    acute kidney failure post surgery  . COPD (chronic obstructive pulmonary disease) (HCC)    uses Albuterol and Spiriva daily  . Coronary artery disease    has 1 stent  . Depression    takes Prozac daily  . Emphysema    sees Dr.Ramaswami for this  . GERD (gastroesophageal reflux disease)    takes Prilosec daily  . Glaucoma    hx of  . H/O hiatal hernia   . Headache(784.0)   . Hypertension    takes  HYzaar daily  . Insomnia    takes Trazodone nightly  . Lung mass    right upper lobe  . Myocardial infarct   . Obesity   . Parathyroid disease ([facility]  . Periodic limb movements of sleep   . Peripheral neuropathy (HCS[place]  . Pneumonia    hx of' 65 yo,rd' last time in 2012  . Productive cough    white in color but no odor  . Shortness of breath    with exertion   . Sleep apnea    uses BiPaP  . Syncope and collapse 05/26/2013  . Thyroid disease   . Type II diabetes mellitus (HCC)    takes Metformin bid and Novolog and Lantus daily    Past Surgical History:  Procedure Laterality Date  . CARDIAC CATHETERIZATION  2006/2008/2009  . CARPAL TUNNEL RELEASE     bilateral  . COLONOSCOPY    . CORONARY ANGIOPLASTY WITH STENT PLACEMENT     1 stent  . ELBOW SURGERY     ulnar nerve; left  . ESOPHAGOGASTRODUODENOSCOPY    . EYE SURGERY    . INGUINAL HERNIA REPAIR  1990's   double inguinal  . LOBECTOMY  03/17/2012   upper right side with resection  . REFRACTIVE SURGERY     bilaterally  .  ROTATOR CUFF REPAIR     left   Current Outpatient Prescriptions on File Prior to Visit  Medication Sig Dispense Refill  . aspirin EC 81 MG tablet Take 81 mg by mouth daily.    Marland Kitchen atorvastatin (LIPITOR) 40 MG tablet Take 40 mg by mouth daily.    Marland Kitchen diltiazem (CARDIZEM CD) 120 MG 24 hr capsule Take 1 capsule (120 mg total) by mouth daily. 90 capsule 3  . FLUoxetine (PROZAC) 40 MG capsule TAKE ONE CAPSULE BY MOUTH EVERY DAY 90 capsule 1  . gabapentin (NEURONTIN) 300 MG capsule TAKE 1 CAPSULE BY MOUTH THREE TIMES DAILY 270 capsule 1  . insulin aspart (NOVOLOG) 100 UNIT/ML injection Check blood glucose three (3) times daily before meals. CBG < 70: implement hypoglycemia protocol CBG 70 -120: 0 UNITS CBG 121 - 150: 5 UNITS CBG 151 - 200: 10 UNITS CBG 201 - 250: 15 UNITS CBG 251 -300: 20 UNITS CBG 301 - 350: 25 UNITS CBG 351 - 400: 30 UNITS    . ipratropium-albuterol (DUONEB) 0.5-2.5 (3) MG/3ML SOLN Take 3 mLs by nebulization 4 (four)  times daily. (Patient taking differently: Take 3 mLs by nebulization every 6 (six) hours as needed. ) 360 mL 11  . LANTUS 100 UNIT/ML injection Inject 15 Units into the skin every morning.   3  . losartan (COZAAR) 25 MG tablet Take 25 mg by mouth daily.    . Multiple Vitamin (MULTIVITAMIN WITH MINERALS) TABS Take 1 tablet by mouth daily.    . nitroGLYCERIN (NITROSTAT) 0.4 MG SL tablet Place 0.4 mg under the tongue every 5 (five) minutes as needed for chest pain.    . ranitidine (ZANTAC) 150 MG capsule Take 1 capsule (150 mg total) by mouth 2 (two) times daily. 60 capsule 11  . tiZANidine (ZANAFLEX) 4 MG tablet Take 8 mg by mouth 2 (two) times daily.    Marland Kitchen topiramate (TOPAMAX) 50 MG tablet Take 1 tablet (50 mg total) by mouth 2 (two) times daily. 360 tablet 0  . traZODone (DESYREL) 100 MG tablet TAKE ONE TABLET BY MOUTH EVERY NIGHT AT BEDTIME 90 tablet 1   No current facility-administered medications on file prior to visit.    Allergies  Allergen Reactions  . Actos [Pioglitazone] Other (See Comments)    edema  . Avandia [Rosiglitazone] Other (See Comments)    edema  . Dilaudid [Hydromorphone Hcl]     Made him crazy   Social History   Social History  . Marital status: Married    Spouse name: mary lou  . Number of children: 3  . Years of education: trade    Occupational History  . truck driver     retired   Social History Main Topics  . Smoking status: Current Every Day Smoker    Packs/day: 1.00    Years: 45.00    Types: Cigarettes  . Smokeless tobacco: Never Used  . Alcohol use Yes     Comment: gallon per week liquor "Haven't drink in 6 weeks"  . Drug use:     Types: Cocaine     Comment: quit 1990's  . Sexual activity: Not Currently   Other Topics Concern  . 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 COPD Mother   . Diabetes Mother   . Hypertension Mother   . Heart attack Father   . Diabetes Father   . Anesthesia problems Neg Hx   . Hypotension Neg Hx   . Malignant hyperthermia Neg Hx   . Pseudochol deficiency Neg Hx       Review of Systems  All other systems reviewed and are negative.      Objective:   Physical Exam  Constitutional: He appears well-developed and well-nourished. No distress.  Neck: Neck supple. No JVD present. No thyromegaly present.  Cardiovascular: Normal rate, regular rhythm and normal heart sounds.   Pulmonary/Chest: No accessory muscle usage. No tachypnea and no bradypnea. No respiratory distress. He has decreased breath sounds in the right upper field and the right middle field. He has wheezes. He has no rales.  Abdominal: Soft. Bowel sounds are normal. He exhibits no distension. There is no tenderness. There is no rebound and no guarding.  Musculoskeletal: He exhibits no edema.  Lymphadenopathy:    He has no cervical adenopathy.  Skin: He is not diaphoretic.  Vitals reviewed.         Assessment & Plan:  Other emphysema ([place]) - Plan: CBC with Differential/Platelet, COMPLETE METABOLIC PANEL WITH GFR, Lipid panel  CIGARETTE SMOKER  Non-small cell carcinoma of lung, stage 1, unspecified laterality ([place])  I continue to encourage smoking cessation. Prior to his upcoming surgery, I would like to check a CBC to monitor for anemia, I would like to monitor his chronic kidney disease with a CMP. I will also check a lipid panel to monitor his treatment with atorvastatin given his history of CAD. Patient will receive his flu shot today. I recommended that he stop his aspirin one week prior to his lung surgery and then resume it immediately after. I will give the patient samples of Anoro one inhalation a day for his emphysema to improve his breathing, increase his lung volumes, and hopefully prevent COPD exacerbations  particularly around the time of his upcoming surgery. I wished him the best of luck.

## 2016-08-18 LAB — COMPLETE METABOLIC PANEL WITH GFR
ALBUMIN: 4.2 g/dL (ref 3.6–5.1)
ALK PHOS: 79 U/L (ref 40–115)
ALT: 9 U/L (ref 9–46)
AST: 10 U/L (ref 10–35)
BILIRUBIN TOTAL: 0.4 mg/dL (ref 0.2–1.2)
BUN: 25 mg/dL (ref 7–25)
CALCIUM: 9 mg/dL (ref 8.6–10.3)
CO2: 22 mmol/L (ref 20–31)
Chloride: 107 mmol/L (ref 98–110)
Creat: 1.44 mg/dL — ABNORMAL HIGH (ref 0.70–1.25)
GFR, EST AFRICAN AMERICAN: 58 mL/min — AB (ref >=60)
GFR, EST NON AFRICAN AMERICAN: 50 mL/min — AB (ref >=60)
Glucose, Bld: 71 mg/dL (ref 70–99)
POTASSIUM: 4.7 mmol/L (ref 3.5–5.3)
Sodium: 137 mmol/L (ref 135–146)
Total Protein: 6.3 g/dL (ref 6.1–8.1)

## 2016-08-18 LAB — CBC WITH DIFFERENTIAL/PLATELET
BASOS ABS: 0 {cells}/uL (ref 0–200)
Basophils Relative: 0 %
EOS ABS: 206 {cells}/uL (ref 15–500)
EOS PCT: 2 %
HEMATOCRIT: 42.4 % (ref 38.5–50.0)
HEMOGLOBIN: 13.7 g/dL (ref 13.0–17.0)
LYMPHS ABS: 1648 {cells}/uL (ref 850–3900)
Lymphocytes Relative: 16 %
MCH: 30.8 pg (ref 27.0–33.0)
MCHC: 32.3 g/dL (ref 32.0–36.0)
MCV: 95.3 fL (ref 80.0–100.0)
MPV: 9.7 fL (ref 7.5–12.5)
Monocytes Absolute: 824 cells/uL (ref 200–950)
Monocytes Relative: 8 %
NEUTROS PCT: 74 %
Neutro Abs: 7622 cells/uL (ref 1500–7800)
Platelets: 220 10*3/uL (ref 140–400)
RBC: 4.45 MIL/uL (ref 4.20–5.80)
RDW: 14.2 % (ref 11.0–15.0)
WBC: 10.3 10*3/uL (ref 3.8–10.8)

## 2016-08-18 LAB — LIPID PANEL
CHOL/HDL RATIO: 5.2 ratio — AB (ref <=5.0)
CHOLESTEROL: 135 mg/dL (ref 125–200)
HDL: 26 mg/dL — AB (ref >=40)
LDL Cholesterol: 70 mg/dL (ref <130)
Triglycerides: 197 mg/dL — ABNORMAL HIGH (ref <150)
VLDL: 39 mg/dL — ABNORMAL HIGH (ref <30)

## 2016-08-23 ENCOUNTER — Encounter (HOSPITAL_COMMUNITY): Payer: Self-pay

## 2016-08-23 ENCOUNTER — Encounter (HOSPITAL_COMMUNITY)
Admission: RE | Admit: 2016-08-23 | Discharge: 2016-08-23 | Disposition: A | Discharge status: Home / Self Care | Payer: PPO | Source: Ambulatory Visit | Attending: Thoracic Surgery (Cardiothoracic Vascular Surgery) | Admitting: Thoracic Surgery (Cardiothoracic Vascular Surgery)

## 2016-08-23 DIAGNOSIS — E1142 Type 2 diabetes mellitus with diabetic polyneuropathy: Secondary | ICD-10-CM | POA: Diagnosis not present

## 2016-08-23 DIAGNOSIS — Z794 Long term (current) use of insulin: Secondary | ICD-10-CM | POA: Diagnosis not present

## 2016-08-23 DIAGNOSIS — Z01818 Encounter for other preprocedural examination: Secondary | ICD-10-CM | POA: Diagnosis not present

## 2016-08-23 DIAGNOSIS — C3432 Malignant neoplasm of lower lobe, left bronchus or lung: Secondary | ICD-10-CM

## 2016-08-23 DIAGNOSIS — M19042 Primary osteoarthritis, left hand: Secondary | ICD-10-CM | POA: Diagnosis not present

## 2016-08-23 DIAGNOSIS — I2510 Atherosclerotic heart disease of native coronary artery without angina pectoris: Secondary | ICD-10-CM | POA: Diagnosis not present

## 2016-08-23 DIAGNOSIS — Z885 Allergy status to narcotic agent status: Secondary | ICD-10-CM | POA: Diagnosis not present

## 2016-08-23 DIAGNOSIS — Z7982 Long term (current) use of aspirin: Secondary | ICD-10-CM | POA: Diagnosis not present

## 2016-08-23 DIAGNOSIS — I252 Old myocardial infarction: Secondary | ICD-10-CM | POA: Diagnosis not present

## 2016-08-23 DIAGNOSIS — H409 Unspecified glaucoma: Secondary | ICD-10-CM | POA: Diagnosis not present

## 2016-08-23 DIAGNOSIS — Z85118 Personal history of other malignant neoplasm of bronchus and lung: Secondary | ICD-10-CM | POA: Diagnosis not present

## 2016-08-23 DIAGNOSIS — K449 Diaphragmatic hernia without obstruction or gangrene: Secondary | ICD-10-CM | POA: Diagnosis not present

## 2016-08-23 DIAGNOSIS — I10 Essential (primary) hypertension: Secondary | ICD-10-CM | POA: Diagnosis not present

## 2016-08-23 DIAGNOSIS — F17210 Nicotine dependence, cigarettes, uncomplicated: Secondary | ICD-10-CM | POA: Diagnosis not present

## 2016-08-23 DIAGNOSIS — J90 Pleural effusion, not elsewhere classified: Secondary | ICD-10-CM | POA: Diagnosis not present

## 2016-08-23 DIAGNOSIS — F329 Major depressive disorder, single episode, unspecified: Secondary | ICD-10-CM | POA: Diagnosis not present

## 2016-08-23 DIAGNOSIS — G4700 Insomnia, unspecified: Secondary | ICD-10-CM | POA: Diagnosis not present

## 2016-08-23 DIAGNOSIS — Z955 Presence of coronary angioplasty implant and graft: Secondary | ICD-10-CM | POA: Diagnosis not present

## 2016-08-23 DIAGNOSIS — Z888 Allergy status to other drugs, medicaments and biological substances status: Secondary | ICD-10-CM | POA: Diagnosis not present

## 2016-08-23 DIAGNOSIS — M19041 Primary osteoarthritis, right hand: Secondary | ICD-10-CM | POA: Diagnosis not present

## 2016-08-23 DIAGNOSIS — G4730 Sleep apnea, unspecified: Secondary | ICD-10-CM | POA: Diagnosis not present

## 2016-08-23 DIAGNOSIS — K219 Gastro-esophageal reflux disease without esophagitis: Secondary | ICD-10-CM | POA: Diagnosis not present

## 2016-08-23 DIAGNOSIS — J449 Chronic obstructive pulmonary disease, unspecified: Secondary | ICD-10-CM | POA: Diagnosis not present

## 2016-08-23 HISTORY — DX: Family history of other specified conditions: Z84.89

## 2016-08-23 LAB — COMPREHENSIVE METABOLIC PANEL
ALT: 20 U/L (ref 17–63)
ANION GAP: 8 (ref 5–15)
AST: 18 U/L (ref 15–41)
Albumin: 4.1 g/dL (ref 3.5–5.0)
Alkaline Phosphatase: 79 U/L (ref 38–126)
BILIRUBIN TOTAL: 0.6 mg/dL (ref 0.3–1.2)
BUN: 22 mg/dL — AB (ref 6–20)
CHLORIDE: 109 mmol/L (ref 101–111)
CO2: 21 mmol/L — ABNORMAL LOW (ref 22–32)
Calcium: 9.2 mg/dL (ref 8.9–10.3)
Creatinine, Ser: 1.4 mg/dL — ABNORMAL HIGH (ref 0.61–1.24)
GFR, EST AFRICAN AMERICAN: 59 mL/min — AB (ref >60)
GFR, EST NON AFRICAN AMERICAN: 51 mL/min — AB (ref >60)
Glucose, Bld: 159 mg/dL — ABNORMAL HIGH (ref 65–99)
POTASSIUM: 4.3 mmol/L (ref 3.5–5.1)
Sodium: 138 mmol/L (ref 135–145)
TOTAL PROTEIN: 7.1 g/dL (ref 6.5–8.1)

## 2016-08-23 LAB — CBC
HEMATOCRIT: 40.4 % (ref 39.0–52.0)
Hemoglobin: 13.9 g/dL (ref 13.0–17.0)
MCH: 31.4 pg (ref 26.0–34.0)
MCHC: 34.4 g/dL (ref 30.0–36.0)
MCV: 91.2 fL (ref 78.0–100.0)
PLATELETS: 221 10*3/uL (ref 150–400)
RBC: 4.43 MIL/uL (ref 4.22–5.81)
RDW: 13.7 % (ref 11.5–15.5)
WBC: 14.8 10*3/uL — ABNORMAL HIGH (ref 4.0–10.5)

## 2016-08-23 LAB — GLUCOSE, CAPILLARY: GLUCOSE-CAPILLARY: 170 mg/dL — AB (ref 65–99)

## 2016-08-23 LAB — PROTIME-INR
INR: 1.02
PROTHROMBIN TIME: 13.5 s (ref 11.4–15.2)

## 2016-08-23 LAB — APTT: aPTT: 38 seconds — ABNORMAL HIGH (ref 24–36)

## 2016-08-23 NOTE — Progress Notes (Signed)
PCP - Dr. Jenna Luo Cardiologist - Dr. Daneen Schick (Pt. Has not seen since 09/19/15; only sees on a yearly basis)  EKG - 09/19/15 CXR - 08/23/16 Echo - 2013 Stress test - denies Cardiac Cath - 2009  Patient denies chest pain and acute shortness of breath.    Patient states that he checks his blood sugar 3 times a day and that his fasting glucose is usually 125-130.

## 2016-08-23 NOTE — Pre-Procedure Instructions (Signed)
[name]  08/23/2016      [facility] [place], [place] - [address] [place] [address] Phone: [phone] Fax: [phone]  [id number] [place] [place]: [phone]: [phone]   Your procedure is scheduled on Friday, October 20th, 2017.  [facility]   Call this number if you have problems the morning of surgery:  [phone]   Remember:  Do not eat food or drink liquids after midnight.   Take these medicines the morning of surgery with A SIP OF WATER: Tylenol (Acetaminophen) if needed, Diltiazem (Cardizem), Fluoxetine (Prozac), Gabapentin (Neurontin), Duoneb Nebulizer if needed, Ranitidine (Zantac), Tizanidine (Zanaflex), Topiramate (Topamax), Anoro Ellipta inhaler (please bring inhaler with you).   WHAT DO I DO ABOUT MY DIABETES MEDICATION?  . THE MORNING OF SURGERY, take 7 units of Lantus insulin.  . The morning of surgery, if your CBG is greater than 220 mg/dL, you may take  of your sliding scale (correction) dose of insulin.   Stop taking: Aspirin, NSAIDS, Aleve, Naproxen, Ibuprofen, Advil, Motrin, BC's, Goody's, Fish oil, all herbal medications, and all vitamins.    Do not wear jewelry.  Do not wear lotions, powders, or colognes, or deoderant.  Men may shave face and neck.  Do not bring valuables to the hospital.  [facility] belongings or [facility].  Contacts, dentures or bridgework may not be worn into surgery.  Leave your suitcase in the car.  After surgery it may be brought to your room.  For patients admitted to the hospital, discharge time will be determined by your treatment team.  Patients discharged the day of surgery will not be allowed to drive home.   Special instructions:  Preparing for  Surgery  Please read over the following fact sheets that you were given.    How to Manage Your Diabetes Before and After Surgery  Why is it important to control my blood sugar before and after surgery? . Improving blood sugar levels before and after surgery helps healing and can limit problems. . A way of improving blood sugar control is eating a healthy diet by: o  Eating less sugar and carbohydrates o  Increasing activity/exercise o  Talking with your doctor about reaching your blood sugar goals . High blood sugars (greater than 180 mg/dL) can raise your risk of infections and slow your recovery, so you will need to focus on controlling your diabetes during the weeks before surgery. . Make sure that the doctor who takes care of your diabetes knows about your planned surgery including the date and location.  How do I manage my blood sugar before surgery? . Check your blood sugar at least 4 times a day, starting 2 days before surgery, to make sure that the level is not too high or low. o Check your blood sugar the morning of your surgery when you wake up and every 2 hours until you get to the Short Stay unit. . If your blood sugar is less than 70 mg/dL, you will need to treat for low blood sugar: o Do not take insulin. o Treat a low blood sugar (less than 70 mg/dL) with  cup of clear juice (cranberry or apple), 4 glucose tablets, OR glucose gel. o Recheck blood sugar in 15 minutes after  treatment (to make sure it is greater than 70 mg/dL). If your blood sugar is not greater than 70 mg/dL on recheck, call [phone] for further instructions. . Report your blood sugar to the short stay nurse when you get to Short Stay.  . If you are admitted to the hospital after surgery: o Your blood sugar will be checked by the staff and you will probably be given insulin after surgery (instead of oral diabetes medicines) to make sure you have good blood sugar levels. o The goal for blood sugar  control after surgery is 80-180 mg/dL.     [facility]- Preparing For Surgery  Before surgery, you can play an important role. Because skin is not sterile, your skin needs to be as free of germs as possible. You can reduce the number of germs on your skin by washing with CHG (chlorahexidine gluconate) Soap before surgery.  CHG is an antiseptic cleaner which kills germs and bonds with the skin to continue killing germs even after washing.  Please do not use if you have an allergy to CHG or antibacterial soaps. If your skin becomes reddened/irritated stop using the CHG.  Do not shave (including legs and underarms) for at least 48 hours prior to first CHG shower. It is OK to shave your face.  Please follow these instructions carefully.   1. Shower the NIGHT BEFORE SURGERY and the MORNING OF SURGERY with CHG.   2. If you chose to wash your hair, wash your hair first as usual with your normal shampoo.  3. After you shampoo, rinse your hair and body thoroughly to remove the shampoo.  4. Use CHG as you would any other liquid soap. You can apply CHG directly to the skin and wash gently with a scrungie or a clean washcloth.   5. Apply the CHG Soap to your body ONLY FROM THE NECK DOWN.  Do not use on open wounds or open sores. Avoid contact with your eyes, ears, mouth and genitals (private parts). Wash genitals (private parts) with your normal soap.  6. Wash thoroughly, paying special attention to the area where your surgery will be performed.  7. Thoroughly rinse your body with warm water from the neck down.  8. DO NOT shower/wash with your normal soap after using and rinsing off the CHG Soap.  9. Pat yourself dry with a CLEAN TOWEL.   10. Wear CLEAN PAJAMAS   11. Place CLEAN SHEETS on your bed the night of your first shower and DO NOT SLEEP WITH PETS.  Day of Surgery: Do not apply any deodorants/lotions. Please wear clean clothes to the hospital/surgery center.

## 2016-08-23 NOTE — Progress Notes (Signed)
Anesthesia Chart Review: Patient is a 65 year old male scheduled for video bronchoscopy with endobronchial navigation, placement of fiducial on 08/24/16 by Dr. Roxan Hockey. 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 He continues to smoke 2PPD, Recent chest CT showed enlarging LUL nodule. CT guided biopsy was positive for squamous cell carcinoma. He was not felt to be a candidate for surgical resection. He was referred to [facility] Dr. Tammi Klippel for stereotactic radiation. Fiducials requested to assist with radiation.   History includes smoking, COPD/emphysema, [place] lung cancer s/p RU lobectomy 03/17/12, CAD/MI s/p RCA stent '06, HTN, DOE, DM2, peripheral neuropathy, GERD, hiatal hernia, anemia, thyroid, depression, glaucoma, OSA (no CPAP since weight loss of > 100 lb), CKD (with ARF post RUL lobectomy '13), insomnia. In 2012 thyroid and parathyroid disease was entered into his history, but in recent records I have not found evidence to support this so far. He denied CP or acute SOB at PAT.   - PCP is Dr. Jenna Luo, last visit 08/17/16. He is aware of surgery plans. He recommended holding ASA one week prior to surgery, flu shot (given 08/17/16), and started Anoro (gave samples). - Pulmonologist is Dr. Chase Caller.  - Cardiologist is Dr. Daneen Schick, last visit 09/19/15. One year follow-up recommended.  Meds include aspirin 81 mg, Lipitor, Cardizem CD, Prozac, Neurontin, NovoLog, DuoNeb, Lantus, losartan, nitroglycerin, Zantac, Zanaflex, Topamax, trazodone, Anoro Ellipta.   BP 135/78   Pulse 79   Temp 36.4 C   Resp 18   Ht '5\' 11"'$  (1.803 m)   Wt 198 lb 9.6 oz (90.1 kg)   SpO2 96%   BMI 27.70 kg/m   09/19/15 EKG: NSR.    06/05/13 Echo: Conclusions: 1. There is moderate concentric LVH. 2. Left ventricular ejection fraction estimated at 50-55%. 3. Mild left atrial enlargement. 4. Trace mitral valve regurgitation. 5. Aortic valve is sclerotic but opens  well. 6. Doppler findings suggest grade 2 diastolic dysfunction with elevated left atrial pressure. 7. There is mild tricuspid regurgitation. 8. Normal estimated RVSP. 9. Suboptimal image quality due to poor sound wave transmission.  06/15/08 Cardiac cath:  RESULTS: 1. Hemodynamic data:     a.     Left ventricular pressure 117/12.     b.     Aortic pressure 116/66. 2. Left ventriculography:  The left ventricle is moderate in size.     Contractility is low normal.  EF is 55%.  No regional abnormalities     noted.  No mitral regurgitation. 3. Coronary angiography.     a.     Left main coronary:  Widely patent and calcified.     b.     Left anterior descending coronary:  Widely patent with      regions of hypodensity and eccentric stenoses noted throughout the      midvessel between the large diagonal branch and the smaller second      diagonal branch.     c.     Circumflex artery:  Generalized segmental 50-60% narrowing      is noted in the proximal and mid circumflex.  No high-grade      obstructions are seen.     d.     Ramus branch:  This branch is large and widely patent.     e.     Right coronary:  Dominant.    Large PDA and 2 left      ventricular branches.  The distal stent in the right  coronary is      widely patent.  The proximal vessel contains luminal      irregularities. 4. Intravascular ultrasound:  In the proximal LAD, there is a focal     region of circumferential calcium with cross-sectional area     slightly greater than 6 mm square.  The tightest region in the mid     vessel where moderate positive remodeling is noted and regions of     calcium that are noncircumferential is around 4.2 mm square. CONCLUSIONS: 1. Moderate circumflex and LAD disease.  No angiographic or     intravascular ultrasound evidence of hemodynamically significant     lesions. 2. Widely patent stent in the right coronary. 3. Normal left ventricular function. PLAN:  Aggressive risk factor  modification.  Encouraged to stop smoking. Diabetes control.  06/05/13 Carotid U/S: Conclusion: Mild bilateral carotid plaque without significant stenosis.  08/23/16 CXR: IMPRESSION: Chronic interstitial fibrotic changes.  There is also minimal residual opacity in the region of recent prior infiltrate. No evidence of acute abnormality.  07/04/16 Chest CT: IMPRESSION: 1. Enlarging superior segment left lower lobe nodule, currently measuring 1.3 by 0.8 cm. The appearance favors malignancy. 2. Stable borderline mediastinal adenopathy. 3. Coronary, aortic arch, and branch vessel atherosclerotic vascular disease. 4. Mild mosaic attenuation in the lungs, possibly from air trapping. Emphysema.  Preoperative labs noted. Cr 1.40, stable. Glucose 159.  WBC 14.8 (routed to Dr. Roxan Hockey and TCTS RN Thurmond Butts), H/H 13.9/40.4. INR 1.02. PTT 38. A1c is pending. Reports fasting CBGs run 125-130's.   Patient with known CAD with cardiology follow-up within the past year. Continues to deny chest pain. Also with recent PCP evaluation and pre-op recommendations given at that appointment. Further evaluation by his anesthesiologist on the day of surgery to ensure no acute changes prior to proceeding.   George Hugh [facility] Short Stay Center/Anesthesiology Phone [phone] 08/23/2016 2:41 PM

## 2016-08-24 ENCOUNTER — Ambulatory Visit (HOSPITAL_COMMUNITY): Payer: PPO

## 2016-08-24 ENCOUNTER — Ambulatory Visit (HOSPITAL_COMMUNITY)
Admission: RE | Admit: 2016-08-24 | Discharge: 2016-08-24 | Disposition: A | Discharge status: Home / Self Care | Payer: PPO | Source: Ambulatory Visit | Attending: Thoracic Surgery (Cardiothoracic Vascular Surgery) | Admitting: Thoracic Surgery (Cardiothoracic Vascular Surgery)

## 2016-08-24 ENCOUNTER — Ambulatory Visit (HOSPITAL_COMMUNITY): Payer: PPO | Admitting: Certified Registered Nurse Anesthetist

## 2016-08-24 ENCOUNTER — Encounter (HOSPITAL_COMMUNITY)
Admission: RE | Disposition: A | Discharge status: Home / Self Care | Payer: Self-pay | Source: Ambulatory Visit | Attending: Thoracic Surgery (Cardiothoracic Vascular Surgery)

## 2016-08-24 ENCOUNTER — Encounter (HOSPITAL_COMMUNITY): Payer: Self-pay | Admitting: *Deleted

## 2016-08-24 ENCOUNTER — Ambulatory Visit (HOSPITAL_COMMUNITY): Payer: PPO | Admitting: Vascular Surgery

## 2016-08-24 DIAGNOSIS — F17210 Nicotine dependence, cigarettes, uncomplicated: Secondary | ICD-10-CM | POA: Insufficient documentation

## 2016-08-24 DIAGNOSIS — I10 Essential (primary) hypertension: Secondary | ICD-10-CM | POA: Diagnosis not present

## 2016-08-24 DIAGNOSIS — J90 Pleural effusion, not elsewhere classified: Secondary | ICD-10-CM | POA: Diagnosis not present

## 2016-08-24 DIAGNOSIS — K449 Diaphragmatic hernia without obstruction or gangrene: Secondary | ICD-10-CM | POA: Diagnosis not present

## 2016-08-24 DIAGNOSIS — Z7982 Long term (current) use of aspirin: Secondary | ICD-10-CM | POA: Insufficient documentation

## 2016-08-24 DIAGNOSIS — Z794 Long term (current) use of insulin: Secondary | ICD-10-CM | POA: Insufficient documentation

## 2016-08-24 DIAGNOSIS — I252 Old myocardial infarction: Secondary | ICD-10-CM | POA: Insufficient documentation

## 2016-08-24 DIAGNOSIS — G4730 Sleep apnea, unspecified: Secondary | ICD-10-CM | POA: Insufficient documentation

## 2016-08-24 DIAGNOSIS — C3432 Malignant neoplasm of lower lobe, left bronchus or lung: Secondary | ICD-10-CM | POA: Insufficient documentation

## 2016-08-24 DIAGNOSIS — Z885 Allergy status to narcotic agent status: Secondary | ICD-10-CM | POA: Insufficient documentation

## 2016-08-24 DIAGNOSIS — Z888 Allergy status to other drugs, medicaments and biological substances status: Secondary | ICD-10-CM | POA: Insufficient documentation

## 2016-08-24 DIAGNOSIS — G4700 Insomnia, unspecified: Secondary | ICD-10-CM | POA: Insufficient documentation

## 2016-08-24 DIAGNOSIS — H409 Unspecified glaucoma: Secondary | ICD-10-CM | POA: Diagnosis not present

## 2016-08-24 DIAGNOSIS — J449 Chronic obstructive pulmonary disease, unspecified: Secondary | ICD-10-CM | POA: Diagnosis not present

## 2016-08-24 DIAGNOSIS — F329 Major depressive disorder, single episode, unspecified: Secondary | ICD-10-CM | POA: Insufficient documentation

## 2016-08-24 DIAGNOSIS — Z419 Encounter for procedure for purposes other than remedying health state, unspecified: Secondary | ICD-10-CM

## 2016-08-24 DIAGNOSIS — M19041 Primary osteoarthritis, right hand: Secondary | ICD-10-CM | POA: Insufficient documentation

## 2016-08-24 DIAGNOSIS — M19042 Primary osteoarthritis, left hand: Secondary | ICD-10-CM | POA: Diagnosis not present

## 2016-08-24 DIAGNOSIS — I2510 Atherosclerotic heart disease of native coronary artery without angina pectoris: Secondary | ICD-10-CM | POA: Diagnosis not present

## 2016-08-24 DIAGNOSIS — E1142 Type 2 diabetes mellitus with diabetic polyneuropathy: Secondary | ICD-10-CM | POA: Insufficient documentation

## 2016-08-24 DIAGNOSIS — K219 Gastro-esophageal reflux disease without esophagitis: Secondary | ICD-10-CM | POA: Diagnosis not present

## 2016-08-24 DIAGNOSIS — Z955 Presence of coronary angioplasty implant and graft: Secondary | ICD-10-CM | POA: Insufficient documentation

## 2016-08-24 DIAGNOSIS — F1420 Cocaine dependence, uncomplicated: Secondary | ICD-10-CM | POA: Diagnosis not present

## 2016-08-24 DIAGNOSIS — Z9889 Other specified postprocedural states: Secondary | ICD-10-CM

## 2016-08-24 DIAGNOSIS — D649 Anemia, unspecified: Secondary | ICD-10-CM | POA: Diagnosis not present

## 2016-08-24 DIAGNOSIS — D0222 Carcinoma in situ of left bronchus and lung: Secondary | ICD-10-CM | POA: Diagnosis not present

## 2016-08-24 DIAGNOSIS — Z85118 Personal history of other malignant neoplasm of bronchus and lung: Secondary | ICD-10-CM | POA: Insufficient documentation

## 2016-08-24 HISTORY — PX: FUDUCIAL PLACEMENT: SHX5083

## 2016-08-24 HISTORY — PX: VIDEO BRONCHOSCOPY WITH ENDOBRONCHIAL NAVIGATION: SHX6175

## 2016-08-24 LAB — HEMOGLOBIN A1C
HEMOGLOBIN A1C: 7.2 % — AB (ref 4.8–5.6)
Mean Plasma Glucose: 160 mg/dL

## 2016-08-24 LAB — GLUCOSE, CAPILLARY
GLUCOSE-CAPILLARY: 141 mg/dL — AB (ref 65–99)
Glucose-Capillary: 96 mg/dL (ref 65–99)

## 2016-08-24 SURGERY — VIDEO BRONCHOSCOPY WITH ENDOBRONCHIAL NAVIGATION
Anesthesia: General | Site: Chest

## 2016-08-24 MED ORDER — EPHEDRINE 5 MG/ML INJ
INTRAVENOUS | Status: AC
  Filled 2016-08-24: qty 10

## 2016-08-24 MED ORDER — LACTATED RINGERS IV SOLN
INTRAVENOUS | Status: DC
Start: 2016-08-24 — End: 2016-08-24

## 2016-08-24 MED ORDER — MIDAZOLAM HCL 2 MG/2ML IJ SOLN
INTRAMUSCULAR | Status: AC
  Filled 2016-08-24: qty 2

## 2016-08-24 MED ORDER — MIDAZOLAM HCL 5 MG/5ML IJ SOLN
INTRAMUSCULAR | Status: DC | PRN
  Administered 2016-08-24 (×2): 1 mg via INTRAVENOUS

## 2016-08-24 MED ORDER — ONDANSETRON HCL 4 MG/2ML IJ SOLN: 4.0000 mg | Freq: Once | INTRAMUSCULAR | Status: DC | PRN

## 2016-08-24 MED ORDER — EPINEPHRINE PF 1 MG/ML IJ SOLN
INTRAMUSCULAR | Status: AC
  Filled 2016-08-24: qty 1

## 2016-08-24 MED ORDER — EPHEDRINE SULFATE-NACL 50-0.9 MG/10ML-% IV SOSY
PREFILLED_SYRINGE | INTRAVENOUS | Status: DC | PRN
  Administered 2016-08-24: 5 mg via INTRAVENOUS

## 2016-08-24 MED ORDER — OXYCODONE HCL 5 MG/5ML PO SOLN: 5.0000 mg | Freq: Once | ORAL | Status: DC | PRN

## 2016-08-24 MED ORDER — FENTANYL CITRATE (PF) 100 MCG/2ML IJ SOLN: 25.0000 ug | INTRAMUSCULAR | Status: DC | PRN

## 2016-08-24 MED ORDER — FENTANYL CITRATE (PF) 100 MCG/2ML IJ SOLN
INTRAMUSCULAR | Status: AC
Start: 2016-08-24 — End: 2016-08-24
  Filled 2016-08-24: qty 4

## 2016-08-24 MED ORDER — LIDOCAINE 2% (20 MG/ML) 5 ML SYRINGE
INTRAMUSCULAR | Status: DC | PRN
  Administered 2016-08-24: 100 mg via INTRAVENOUS

## 2016-08-24 MED ORDER — SUGAMMADEX SODIUM 200 MG/2ML IV SOLN
INTRAVENOUS | Status: DC | PRN
  Administered 2016-08-24: 200 mg via INTRAVENOUS

## 2016-08-24 MED ORDER — FENTANYL CITRATE (PF) 100 MCG/2ML IJ SOLN
INTRAMUSCULAR | Status: DC | PRN
  Administered 2016-08-24 (×3): 50 ug via INTRAVENOUS

## 2016-08-24 MED ORDER — PHENYLEPHRINE HCL 10 MG/ML IJ SOLN
INTRAVENOUS | Status: DC | PRN
  Administered 2016-08-24: 25 ug/min via INTRAVENOUS

## 2016-08-24 MED ORDER — PROPOFOL 10 MG/ML IV BOLUS
INTRAVENOUS | Status: AC
  Filled 2016-08-24: qty 20

## 2016-08-24 MED ORDER — OXYCODONE HCL 5 MG PO TABS: 5.0000 mg | ORAL_TABLET | Freq: Once | ORAL | Status: DC | PRN

## 2016-08-24 MED ORDER — PROPOFOL 10 MG/ML IV BOLUS: INTRAVENOUS | Status: DC | PRN

## 2016-08-24 MED ORDER — ONDANSETRON HCL 4 MG/2ML IJ SOLN
INTRAMUSCULAR | Status: DC | PRN
  Administered 2016-08-24: 4 mg via INTRAVENOUS

## 2016-08-24 MED ORDER — ROCURONIUM BROMIDE 10 MG/ML (PF) SYRINGE
PREFILLED_SYRINGE | INTRAVENOUS | Status: DC | PRN
  Administered 2016-08-24: 50 mg via INTRAVENOUS

## 2016-08-24 MED ORDER — PROPOFOL 10 MG/ML IV BOLUS
INTRAVENOUS | Status: DC | PRN
  Administered 2016-08-24: 150 mg via INTRAVENOUS

## 2016-08-24 MED ORDER — LACTATED RINGERS IV SOLN
INTRAVENOUS | Status: DC | PRN
  Administered 2016-08-24: 12:00:00 via INTRAVENOUS

## 2016-08-24 MED ORDER — ONDANSETRON HCL 4 MG/2ML IJ SOLN
INTRAMUSCULAR | Status: AC
  Filled 2016-08-24: qty 2

## 2016-08-24 MED ORDER — 0.9 % SODIUM CHLORIDE (POUR BTL) OPTIME
TOPICAL | Status: DC | PRN
  Administered 2016-08-24: 1000 mL

## 2016-08-24 SURGICAL SUPPLY — 44 items
ADAPTER BRONCH F/PENTAX (ADAPTER) ×4 IMPLANT
ADPR BSCP EDG PNTX (ADAPTER) ×2
BRUSH SUPERTRAX BIOPSY (INSTRUMENTS) IMPLANT
BRUSH SUPERTRAX NDL-TIP CYTO (INSTRUMENTS) IMPLANT
CANISTER SUCTION 2500CC (MISCELLANEOUS) ×4 IMPLANT
CHANNEL WORK EXTEND EDGE 180 (KITS) IMPLANT
CHANNEL WORK EXTEND EDGE 45 (KITS) IMPLANT
CHANNEL WORK EXTEND EDGE 90 (KITS) IMPLANT
CONT SPEC 4OZ CLIKSEAL STRL BL (MISCELLANEOUS) ×8 IMPLANT
COVER TABLE BACK 60X90 (DRAPES) ×4 IMPLANT
FILTER STRAW FLUID ASPIR (MISCELLANEOUS) IMPLANT
FORCEPS BIOP SUPERTRX PREMAR (INSTRUMENTS) IMPLANT
GAUZE SPONGE 4X4 12PLY STRL (GAUZE/BANDAGES/DRESSINGS) ×4 IMPLANT
GLOVE BIOGEL PI IND STRL 6.5 (GLOVE) IMPLANT
GLOVE BIOGEL PI INDICATOR 6.5 (GLOVE) ×2
GLOVE SURG SIGNA 7.5 PF LTX (GLOVE) ×4 IMPLANT
GOWN STRL REUS W/ TWL LRG LVL3 (GOWN DISPOSABLE) IMPLANT
GOWN STRL REUS W/ TWL XL LVL3 (GOWN DISPOSABLE) ×2 IMPLANT
GOWN STRL REUS W/TWL LRG LVL3 (GOWN DISPOSABLE) ×4
GOWN STRL REUS W/TWL XL LVL3 (GOWN DISPOSABLE) ×4
KIT CLEAN ENDO COMPLIANCE (KITS) ×4 IMPLANT
KIT MARKER FIDUCIAL DELIVERY (KITS) ×2 IMPLANT
KIT PROCEDURE EDGE 180 (KITS) ×2 IMPLANT
KIT PROCEDURE EDGE 45 (KITS) IMPLANT
KIT PROCEDURE EDGE 90 (KITS) IMPLANT
KIT ROOM TURNOVER OR (KITS) ×4 IMPLANT
MARKER FIDUCIAL SL NIT COIL (Implant Marker) ×2 IMPLANT
MARKER SKIN DUAL TIP RULER LAB (MISCELLANEOUS) ×4 IMPLANT
NDL SUPERTRX PREMARK BIOPSY (NEEDLE) IMPLANT
NEEDLE SUPERTRX PREMARK BIOPSY (NEEDLE) IMPLANT
NS IRRIG 1000ML POUR BTL (IV SOLUTION) ×4 IMPLANT
OIL SILICONE PENTAX (PARTS (SERVICE/REPAIRS)) ×4 IMPLANT
PAD ARMBOARD 7.5X6 YLW CONV (MISCELLANEOUS) ×8 IMPLANT
PATCHES PATIENT (LABEL) ×12 IMPLANT
SYR 20CC LL (SYRINGE) ×4 IMPLANT
SYR 20ML ECCENTRIC (SYRINGE) ×4 IMPLANT
SYR 30ML LL (SYRINGE) ×4 IMPLANT
SYR 5ML LL (SYRINGE) ×4 IMPLANT
TOWEL OR 17X24 6PK STRL BLUE (TOWEL DISPOSABLE) ×4 IMPLANT
TRAP SPECIMEN MUCOUS 40CC (MISCELLANEOUS) ×4 IMPLANT
TUBE CONNECTING 20'X1/4 (TUBING) ×1
TUBE CONNECTING 20X1/4 (TUBING) ×5 IMPLANT
UNDERPAD 30X30 (UNDERPADS AND DIAPERS) ×4 IMPLANT
WATER STERILE IRR 1000ML POUR (IV SOLUTION) ×4 IMPLANT

## 2016-08-24 NOTE — H&P (View-Only) (Signed)
PCP is Odette Fraction, MD Referring Provider is Brand Males, MD  Chief Complaint  Patient presents with  . 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  Mr. Macomber is a 65 year old gentleman history of tobacco abuse and COPD. He also has a history of coronary artery disease, COPD, anemia, chronic kidney disease, type 2 diabetes, complicated by neuropathy, depression, reflux, glaucoma, and multiple other issues. He had a right upper lobectomy and node dissection on 03/17/2012 for a stage IA squamous cell carcinoma. He continued to smoke 2 packs of cigarettes even after that operation. He recently saw Dr. [name] in follow-up. A CT chest showed an enlarging nodule in the superior segment of the left lower lobe. A PET CT showed the nodule was hypermetabolic. There was some mild mediastinal adenopathy thought to be reactive and related to COPD. A CT-guided biopsy was positive for squamous cell carcinoma.  Dr. [name] did not feel he was a candidate for surgical resection due to his comorbidities. He saw Dr. Tammi Klippel radiation oncology. He was advised to have stereotactic radiation. Dr. Tammi Klippel wishes to have fiducials placed to assist with radiation.   Past Medical History:  Diagnosis Date  . Allergy   . Anemia   . Anginal pain ([place])   . Arthritis    "hands"  . Asthma   . Back pain    4 deteriorating disc and receives an injection q3-72mo;has been doing this for about 482yr . Blood transfusion    as a child  . Bronchitis   . [place] [place] [facility]   Non-small cell lung cancer  . Chronic kidney disease    acute kidney failure post surgery  . COPD (chronic obstructive pulmonary disease) (H[facility])    uses Albuterol and Spiriva daily  . Coronary artery disease    has 1 stent  . Depression    takes Prozac daily  . Emphysema    sees Dr.Ramaswami for this  . GERD  (gastroesophageal reflux disease)    takes Prilosec daily  . Glaucoma    hx of  . H/O hiatal hernia   . Headache(784.0)   . Hypertension    takes  HYzaar daily  . Insomnia    takes Trazodone nightly  . Lung mass    right upper lobe  . Myocardial infarct   . Obesity   . Parathyroid disease ([facility]  . Periodic limb movements of sleep   . Peripheral neuropathy ([facility]  . Pneumonia    hx of' 65 yo,rd' last time in 2012  . Productive cough    white in color but no odor  . Shortness of breath    with exertion   . Sleep apnea    uses BiPaP  . Syncope and collapse 05/26/2013  . Thyroid disease   . Type II diabetes mellitus (H[facility])    takes Metformin bid and Novolog and Lantus daily    Past Surgical History:  Procedure Laterality Date  . CARDIAC CATHETERIZATION  2006/2008/2009  . CARPAL TUNNEL RELEASE     bilateral  . COLONOSCOPY    . CORONARY ANGIOPLASTY WITH STENT PLACEMENT     1 stent  . ELBOW SURGERY     ulnar nerve; left  . ESOPHAGOGASTRODUODENOSCOPY    . EYE SURGERY    . INGUINAL HERNIA REPAIR  1990's   double inguinal  . LOBECTOMY  03/17/2012  upper right side with resection  . REFRACTIVE SURGERY     bilaterally  . ROTATOR CUFF REPAIR     left    Family History  Problem Relation Age of Onset  . COPD Mother   . Diabetes Mother   . Hypertension Mother   . Heart attack Father   . Diabetes Father   . Anesthesia problems Neg Hx   . Hypotension Neg Hx   . Malignant hyperthermia Neg Hx   . Pseudochol deficiency Neg Hx     Social History Social History  Substance Use Topics  . Smoking status: Current Every Day Smoker    Packs/day: 1.00    Years: 45.00    Types: Cigarettes    Last attempt to quit: 03/26/2012  . Smokeless tobacco: Never Used  . Alcohol use Yes     Comment: gallon per week liquor "Haven't drink in 6 weeks"    Current Outpatient Prescriptions  Medication Sig Dispense Refill  . aspirin EC 81 MG tablet Take 81 mg by mouth daily.    Marland Kitchen  atorvastatin (LIPITOR) 40 MG tablet Take 40 mg by mouth daily.    Marland Kitchen diltiazem (CARDIZEM CD) 120 MG 24 hr capsule Take 1 capsule (120 mg total) by mouth daily. 90 capsule 3  . FLUoxetine (PROZAC) 40 MG capsule TAKE ONE CAPSULE BY MOUTH EVERY DAY 90 capsule 1  . gabapentin (NEURONTIN) 300 MG capsule TAKE 1 CAPSULE BY MOUTH THREE TIMES DAILY 270 capsule 1  . insulin aspart (NOVOLOG) 100 UNIT/ML injection Check blood glucose three (3) times daily before meals. CBG < 70: implement hypoglycemia protocol CBG 70 -120: 0 UNITS CBG 121 - 150: 5 UNITS CBG 151 - 200: 10 UNITS CBG 201 - 250: 15 UNITS CBG 251 -300: 20 UNITS CBG 301 - 350: 25 UNITS CBG 351 - 400: 30 UNITS    . ipratropium-albuterol (DUONEB) 0.5-2.5 (3) MG/3ML SOLN Take 3 mLs by nebulization 4 (four) times daily. (Patient taking differently: Take 3 mLs by nebulization every 6 (six) hours as needed. ) 360 mL 11  . LANTUS 100 UNIT/ML injection Inject 15 Units into the skin every morning.   3  . losartan (COZAAR) 25 MG tablet Take 25 mg by mouth daily.    . Multiple Vitamin (MULTIVITAMIN WITH MINERALS) TABS Take 1 tablet by mouth daily.    . nitroGLYCERIN (NITROSTAT) 0.4 MG SL tablet Place 0.4 mg under the tongue every 5 (five) minutes as needed for chest pain.    . ranitidine (ZANTAC) 150 MG capsule Take 1 capsule (150 mg total) by mouth 2 (two) times daily. 60 capsule 11  . tiZANidine (ZANAFLEX) 4 MG tablet Take 8 mg by mouth 2 (two) times daily.    Marland Kitchen topiramate (TOPAMAX) 50 MG tablet Take 1 tablet (50 mg total) by mouth 2 (two) times daily. 360 tablet 0  . traZODone (DESYREL) 100 MG tablet TAKE ONE TABLET BY MOUTH EVERY NIGHT AT BEDTIME 90 tablet 1   No current facility-administered medications for this visit.     Allergies  Allergen Reactions  . Actos [Pioglitazone] Other (See Comments)    edema  . Avandia [Rosiglitazone] Other (See Comments)    edema  . Dilaudid [Hydromorphone Hcl]     Made him crazy    Review of Systems   Constitutional: Negative for chills and fever.  Respiratory: Positive for cough and shortness of breath.   Cardiovascular: Negative for chest pain.  Musculoskeletal: Negative for arthralgias.    BP (!) 100/59  Pulse 78   Resp 16   Ht '5\' 11"'$  (1.803 m)   Wt 200 lb (90.7 kg)   SpO2 98% Comment: ON RA  BMI 27.89 kg/m  Physical Exam  Constitutional: He is oriented to person, place, and time. No distress.  Obese  HENT:  Head: Normocephalic and atraumatic.  Eyes: EOM are normal.  Neck: Neck supple. No thyromegaly present.  Cardiovascular: Normal rate, regular rhythm and normal heart sounds.   Pulmonary/Chest: Effort normal.  Faint rhonchi  Abdominal: Soft. He exhibits no distension. There is no tenderness.  Musculoskeletal: He exhibits no edema.  Lymphadenopathy:    He has no cervical adenopathy.  Neurological: He is alert and oriented to person, place, and time. No cranial nerve deficit.  Skin: Skin is warm and dry.  Vitals reviewed.    Diagnostic Tests:  CT CHEST WITHOUT CONTRAST  TECHNIQUE: Multidetector CT imaging of the chest was performed following the standard protocol without IV contrast.  COMPARISON:  12/28/2015  FINDINGS: Cardiovascular: Coronary, aortic arch, and branch vessel atherosclerotic vascular disease. Coronary artery stents noted.  Mediastinum/Nodes: Right lower paratracheal lymph node 1.0 cm in short axis on image 42/3, stable. AP window lymph node 0.9 cm in short axis on image 41/3, stable. Lymph node lateral to the left mainstem bronchus 1.2 cm in short axis on image 48/3, stable.  Lungs/Pleura: Right upper lobe wedge resection. 5 mm in long axis right upper lobe nodule on image 24/4, no change from 10/07/2012. Scarring posteriorly in the right upper lobe, likewise unchanged 2013. Several tiny peripheral nodules in the right upper lobe are likewise unchanged.  In the superior segment left lower lobe, a 1.3 by 0.8 cm solid nodule is  present on image 63/4. This lesion is not readily apparent on the 2014 exam and measured 7 by 5 mm on 05/26/2014 and 7 by 8 mm on 12/28/2015. The increasing size of this nodule is compatible with malignancy.  Peripheral scarring in the posterior basal segments of both lower lobes. Mosaic attenuation in the lung bases, right greater than left the. Peripheral emphysema.  Upper Abdomen: Mild fullness of the adrenal glands without overt mass. Hypodense lesion in the left kidney upper pole, nonspecific.  Musculoskeletal: Thoracic spondylosis.  IMPRESSION: 1. Enlarging superior segment left lower lobe nodule, currently measuring 1.3 by 0.8 cm. The appearance favors malignancy. 2. Stable borderline mediastinal adenopathy. 3. Coronary, aortic arch, and branch vessel atherosclerotic vascular disease. 4. Mild mosaic attenuation in the lungs, possibly from air trapping. Emphysema.   Electronically Signed   By: Van Clines M.D.   On: 07/04/2016 10:41 I personally reviewed the CT chest and concur with the findings noted above.  Impression: Mr. [name] is a 65 year old man with a history of non-small cell carcinoma of the right upper lobe treated with a lobectomy in 2013. He now has a new lesion in the superior segment of the left lower lobe that appears to be a second primary. It was biopsied and proven to be squamous cell carcinoma. He is not a candidate for surgical resection but is a candidate for stereotactic radiation. Dr. Tammi Klippel wishes to have fiducials placed to assist with radiation planning and treatment.  I discussed navigational bronchoscopy and fiducial placement with Mr. Benavides and his wife. I described the general nature of the procedure to him. He understands and would be done under general anesthesia on an outpatient basis. I reviewed the indications, risks, benefits, and alternatives. He understands the risk include those associated with general anesthesia  such as  stroke or MI, as well as procedure specific risks such as bleeding, pneumothorax, or fiducial malposition.  He accepts the risks and agrees to proceed.  Plan: Navigational bronchoscopy with fiducial placement on Friday, October 20  Melrose Nakayama, MD [facility] [phone]

## 2016-08-24 NOTE — Anesthesia Postprocedure Evaluation (Signed)
Anesthesia Post Note  Patient: [name]  Procedure(s) Performed: Procedure(s) (LRB): VIDEO BRONCHOSCOPY WITH ENDOBRONCHIAL NAVIGATION (N/A) PLACEMENT OF FUDUCIAL (Left)  Patient location during evaluation: PACU Anesthesia Type: General Level of consciousness: awake and alert Pain management: pain level controlled Vital Signs Assessment: post-procedure vital signs reviewed and stable Respiratory status: spontaneous breathing, nonlabored ventilation, respiratory function stable and patient connected to nasal cannula oxygen Cardiovascular status: blood pressure returned to baseline and stable Postop Assessment: no signs of nausea or vomiting Anesthetic complications: no    Last Vitals:  Vitals:   08/24/16 0852 08/24/16 1325  BP: 113/60 (!) 105/59  Pulse: 76 74  Resp: 18 12  Temp: 36.7 C 36.6 C    Last Pain:  Vitals:   08/24/16 1325  TempSrc:   PainSc: 0-No pain                 Zenaida Deed

## 2016-08-24 NOTE — Discharge Instructions (Addendum)
Do not drive or engage in heavy physical activity for 24 hours  You may resume normal activities tomorrow  You may cough up small amounts (less than a teaspoon) of blood over the next few days  You may use Tylenol if you have any throat discomfort  Call [phone] if you develop chest pain, shortness of breath, fever > 101F or cough up large amounts of blood  Follow up with Dr. Tammi Klippel as scheduled

## 2016-08-24 NOTE — Anesthesia Preprocedure Evaluation (Signed)
Anesthesia Evaluation  Patient identified by MRN, date of birth, ID band Patient awake    Reviewed: Allergy & Precautions, H&P , NPO status , Patient's Chart, lab work & pertinent test results, reviewed documented beta blocker date and time   Airway Mallampati: II  TM Distance: >3 FB Neck ROM: Full    Dental  (+) Edentulous Upper, Edentulous Lower   Pulmonary shortness of breath, with exertion and lying, asthma , sleep apnea and Continuous Positive Airway Pressure Ventilation , pneumonia, COPD,  COPD inhaler, Current Smoker,    Pulmonary exam normal        Cardiovascular Exercise Tolerance: Poor hypertension, Pt. on medications and Pt. on home beta blockers + CAD, + Cardiac Stents and + DOE  Normal cardiovascular exam Rhythm:Regular Rate:Normal     Neuro/Psych  Headaches, Depression    GI/Hepatic hiatal hernia, GERD  ,  Endo/Other  diabetes, Type 2, Insulin Dependent  Renal/GU      Musculoskeletal   Abdominal   Peds  Hematology   Anesthesia Other Findings Takes diltiazem for rate control  Reproductive/Obstetrics                             Anesthesia Physical  Anesthesia Plan  ASA: III  Anesthesia Plan: General   Post-op Pain Management:    Induction: Intravenous  Airway Management Planned: Oral ETT  Additional Equipment:   Intra-op Plan:   Post-operative Plan: Extubation in OR  Informed Consent: I have reviewed the patients History and Physical, chart, labs and discussed the procedure including the risks, benefits and alternatives for the proposed anesthesia with the patient or authorized representative who has indicated his/her understanding and acceptance.   Dental advisory given  Plan Discussed with: CRNA, Anesthesiologist and Surgeon  Anesthesia Plan Comments:         Anesthesia Quick Evaluation

## 2016-08-24 NOTE — Transfer of Care (Signed)
Immediate Anesthesia Transfer of Care Note  Patient: [name]  Procedure(s) Performed: Procedure(s): VIDEO BRONCHOSCOPY WITH ENDOBRONCHIAL NAVIGATION (N/A) PLACEMENT OF FUDUCIAL (Left)  Patient Location: PACU  Anesthesia Type:General  Level of Consciousness: awake, alert , oriented and patient cooperative  Airway & Oxygen Therapy: Patient Spontanous Breathing and Patient connected to face mask oxygen  Post-op Assessment: Report given to RN, Post -op Vital signs reviewed and stable and Patient moving all extremities X 4  Post vital signs: Reviewed and stable  Last Vitals:  Vitals:   08/24/16 0852  BP: 113/60  Pulse: 76  Resp: 18  Temp: 36.7 C    Last Pain:  Vitals:   08/24/16 0936  TempSrc:   PainSc: 6       Patients Stated Pain Goal: 3 (62/22/97 9892)  Complications: No apparent anesthesia complications

## 2016-08-24 NOTE — Anesthesia Procedure Notes (Signed)
Procedure Name: Intubation Date/Time: 08/24/2016 12:35 PM Performed by: Everlean Cherry A Pre-anesthesia Checklist: Patient identified, Emergency Drugs available, Suction available and Patient being monitored Patient Re-evaluated:Patient Re-evaluated prior to inductionOxygen Delivery Method: Circle system utilized Preoxygenation: Pre-oxygenation with 100% oxygen Ventilation: Mask ventilation without difficulty and Oral airway inserted - appropriate to patient size Laryngoscope Size: Sabra Heck and 2 Grade View: Grade I Tube type: Oral Tube size: 8.5 mm Number of attempts: 1 Airway Equipment and Method: Stylet Secured at: 24 cm Tube secured with: Tape Dental Injury: Teeth and Oropharynx as per pre-operative assessment

## 2016-08-24 NOTE — Interval H&P Note (Signed)
History and Physical Interval Note:  08/24/2016 11:50 AM  Nash Dimmer  has presented today for surgery, with the diagnosis of LLL SQUAMOUS CELL CARCINOMA  The various methods of treatment have been discussed with the patient and family. After consideration of risks, benefits and other options for treatment, the patient has consented to  Procedure(s): [place] (N/A) PLACEMENT OF FUDUCIAL (N/A) as a surgical intervention .  The patient's history has been reviewed, patient examined, no change in status, stable for surgery.  I have reviewed the patient's chart and labs.  Questions were answered to the patient's satisfaction.     Melrose Nakayama

## 2016-08-24 NOTE — Brief Op Note (Signed)
08/24/2016  1:29 PM  PATIENT:  [name]  65 y.o. male  PRE-OPERATIVE DIAGNOSIS:  LEFT LOWER LOBE SQUAMOUS CELL CARCINOMA  POST-OPERATIVE DIAGNOSIS:  LEFT LOWER LOBE SQUAMOUS CELL CARCINOMA  PROCEDURE:   ELECTROMAGNETIC NAVIGATIONAL BRONCHOSCOPY FOR FIDUCIAL PLACEMENT  SURGEON:  Surgeon(s) and Role:    * Melrose Nakayama, MD - Primary  ANESTHESIA:   general  EBL:  Total I/O In: 14 [I.V.:700] Out: 0   BLOOD ADMINISTERED:none  DRAINS: none   LOCAL MEDICATIONS USED:  NONE  SPECIMEN:  No Specimen  DISPOSITION OF SPECIMEN:  N/A  PLAN OF CARE: Discharge to home after PACU  PATIENT DISPOSITION:  PACU - hemodynamically stable.   Delay start of Pharmacological VTE agent (>24hrs) due to surgical blood loss or risk of bleeding: not applicable

## 2016-08-25 ENCOUNTER — Encounter (HOSPITAL_COMMUNITY): Payer: Self-pay | Admitting: Thoracic Surgery (Cardiothoracic Vascular Surgery)

## 2016-08-25 NOTE — Op Note (Signed)
NAMEDRESHAWN, HENDERSHOTT             ACCOUNT NO.:  [id number]  MEDICAL RECORD NO.:  [id number]  LOCATION:  [facility]                         FACILITY:  [facility]  PHYSICIAN:  Revonda Standard. 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  PROCEDURE:  Electromagnetic navigational bronchoscopy for placement of fiducials.  SURGEON:  Revonda Standard. Roxan Hockey, M.D.  ASSISTANT:  None.  ANESTHESIA:  General.  FINDINGS:  Navigated to within 1 cm of the primary lesion mapped and placed fiducials according to the computer-generated locations.  All fiducials within 5 mm of ideal site.  CLINICAL NOTE:  Mr. Fendley is a 65 year old gentleman with a history of right upper lobectomy back in 2013.  He now has developed an enlarging nodule in the superior segment of the left lower lobe on PET-CT, this was hypermetabolic.  A CT-guided biopsy was positive for squamous cell carcinoma.  He saw Dr. Tyler Pita from Radiation Oncology and was advised to have stereotactic radiation.  Dr. Tammi Klippel requested fiducial placement to assist with radiation delivery.  The indications, risks, benefits, and alternatives were discussed in detail with the patient. He understood and accepted the risks and agreed to proceed.  OPERATIVE NOTE:  Mr. Carroll was brought to the operating room on August 24, 2016.  Anesthesia was induced.  He was intubated.  Flexible fiberoptic bronchoscopy was performed via the endotracheal tube.  There were some clear secretions.  The right upper lobe bronchial stump was well healed.  There were no endobronchial lesions to the  level of the subsegmental bronchi.  The locatable guide for navigation was advanced and registration was performed.  There was good correlation of the video and virtual bronchoscopy.  The locatable guide then was advanced to the mapped lesion in the superior segment of the left lower lobe. It was within 1 cm of that nodule.  The locatable guide then was advanced in multiple areas around the nodule and the computer selected 3 sites for fiducial placement.  The locatable guide was navigated to each of these sites. The fiducial was placed and marked each time. The fiducials were all within 5 mm of the ideal site recommended by the computer.  The bronchoscope was withdrawn.  The patient was extubated in the operating room and taken to the postanesthetic care unit in good condition.     Revonda Standard Roxan Hockey, M.D.     SCH/MEDQ  D:  08/24/2016  T:  08/25/2016  Job:  [id number]

## 2016-08-31 ENCOUNTER — Ambulatory Visit
Admission: RE | Admit: 2016-08-31 | Discharge: 2016-08-31 | Disposition: A | Discharge status: Home / Self Care | Payer: PPO | Source: Ambulatory Visit | Attending: Radiation Oncology | Admitting: Radiation Oncology

## 2016-08-31 DIAGNOSIS — C3432 Malignant neoplasm of lower lobe, left bronchus or lung: Secondary | ICD-10-CM | POA: Insufficient documentation

## 2016-08-31 DIAGNOSIS — Z510 Encounter for antineoplastic radiation therapy: Secondary | ICD-10-CM | POA: Diagnosis not present

## 2016-08-31 DIAGNOSIS — C3411 Malignant neoplasm of upper lobe, right bronchus or lung: Secondary | ICD-10-CM

## 2016-08-31 NOTE — Progress Notes (Signed)
Radiation Oncology         [phone]) [phone] ________________________________  Name: [name] MRN: [id number]  Date: 08/31/2016  DOB: April 23, 1951  STEREOTACTIC BODY RADIOTHERAPY SIMULATION AND TREATMENT PLANNING NOTE    ICD-9-CM ICD-10-CM   1. Non-small cell carcinoma of lung, stage 1 (HCC) 162.3 C34.11     DIAGNOSIS:  The primary encounter diagnosis was Non-small cell carcinoma of lung, stage 1 ([place]). A diagnosis of Stage I squamous cell cancer of left lower lobe of lung ([place]) - 2017 was also pertinent to this visit.  NARRATIVE:  The patient was brought to the [facility].  Identity was confirmed.  All relevant records and images related to the planned course of therapy were reviewed.  The patient freely provided informed written consent to proceed with treatment after reviewing the details related to the planned course of therapy. The consent form was witnessed and verified by the simulation staff.  Then, the patient was set-up in a stable reproducible  supine position for radiation therapy.  A BodyFix immobilization pillow was fabricated for reproducible positioning.  Then I personally applied the abdominal compression paddle to limit respiratory excursion.  4D respiratoy motion management CT images were obtained.  Surface markings were placed.  The CT images were loaded into the planning software.  Then, using Cine, MIP, and standard views, the internal target volume (ITV) and planning target volumes (PTV) were delinieated, and avoidance structures were contoured.  Treatment planning then occurred.  The radiation prescription was entered and confirmed.  A total of two complex treatment devices were fabricated in the form of the BodyFix immobilization pillow and a neck accuform cushion.  I have requested : 3D Simulation  I have requested a DVH of the following structures: Heart, Lungs, Esophagus, Chest Wall, Brachial Plexus, Major Blood Vessels, and targets.  SPECIAL  TREATMENT PROCEDURE:  The planned course of therapy using radiation constitutes a special treatment procedure. Special care is required in the management of this patient for the following reasons. This treatment constitutes a Special Treatment Procedure for the following reason: [ High dose per fraction requiring special monitoring for increased toxicities of treatment including daily imaging..  The special nature of the planned course of radiotherapy will require increased physician supervision and oversight to ensure patient's safety with optimal treatment outcomes.  RESPIRATORY MOTION MANAGEMENT SIMULATION:  In order to account for effect of respiratory motion on target structures and other organs in the planning and delivery of radiotherapy, this patient underwent respiratory motion management simulation.  To accomplish this, when the patient was brought to the CT simulation planning suite, 4D respiratoy motion management CT images were obtained.  The CT images were loaded into the planning software.  Then, using a variety of tools including Cine, MIP, and standard views, the target volume and planning target volumes (PTV) were delineated.  Avoidance structures were contoured.  Treatment planning then occurred.  Dose volume histograms were generated and reviewed for each of the requested structure.  The resulting plan was carefully reviewed and approved today.  PLAN:  The patient will receive 54 Gy in 3 fractions.  ________________________________  Sheral Apley Tammi Klippel, M.D.  This document serves as a record of services personally performed by Tyler Pita, MD. It was created on his behalf by Maryla Morrow, a trained medical scribe. The creation of this record is based on the scribe's personal observations and the provider's statements to them. This document has been checked and approved by the attending provider.

## 2016-09-04 ENCOUNTER — Other Ambulatory Visit: Payer: Self-pay | Admitting: Interventional Cardiology

## 2016-09-05 ENCOUNTER — Other Ambulatory Visit: Payer: Self-pay | Admitting: Internal Medicine

## 2016-09-05 DIAGNOSIS — J441 Chronic obstructive pulmonary disease with (acute) exacerbation: Secondary | ICD-10-CM

## 2016-09-06 ENCOUNTER — Telehealth: Payer: Self-pay | Admitting: Family Medicine

## 2016-09-06 DIAGNOSIS — C3432 Malignant neoplasm of lower lobe, left bronchus or lung: Secondary | ICD-10-CM | POA: Diagnosis not present

## 2016-09-06 DIAGNOSIS — Z510 Encounter for antineoplastic radiation therapy: Secondary | ICD-10-CM | POA: Diagnosis not present

## 2016-09-06 NOTE — Telephone Encounter (Signed)
Patient is calling to ask a question about one of her husbands meds please call her at [phone] (H)

## 2016-09-06 NOTE — Telephone Encounter (Signed)
LMTRC

## 2016-09-07 ENCOUNTER — Other Ambulatory Visit: Payer: Self-pay | Admitting: Internal Medicine

## 2016-09-07 DIAGNOSIS — J441 Chronic obstructive pulmonary disease with (acute) exacerbation: Secondary | ICD-10-CM

## 2016-09-07 MED ORDER — IPRATROPIUM-ALBUTEROL 0.5-2.5 (3) MG/3ML IN SOLN: RESPIRATORY_TRACT | 0 refills | Status: DC

## 2016-09-17 ENCOUNTER — Ambulatory Visit
Admission: RE | Admit: 2016-09-17 | Discharge: 2016-09-17 | Disposition: A | Discharge status: Home / Self Care | Payer: PPO | Source: Ambulatory Visit | Attending: Radiation Oncology | Admitting: Radiation Oncology

## 2016-09-17 DIAGNOSIS — Z510 Encounter for antineoplastic radiation therapy: Secondary | ICD-10-CM | POA: Diagnosis not present

## 2016-09-18 ENCOUNTER — Telehealth: Payer: Self-pay | Admitting: *Deleted

## 2016-09-18 NOTE — Telephone Encounter (Signed)
Oncology Nurse Navigator Documentation  Oncology Nurse Navigator Flowsheets 09/18/2016  Navigator Location [facility]  Navigator Encounter Type Telephone/I called to follow up with Ms. Bielby.  He is receiving SBRT [place].  I called and his wife answered.  She stated he is outside blowing leaves.  She stated he is doing well without complaints.  She had questions regarding follow up.  I updated her.  She was thankful for the call.  Telephone Outgoing Call  Abnormal Finding Date 07/04/2016  Confirmed Diagnosis Date 07/18/2016  Multidisiplinary Clinic Date 08/09/2016  Treatment Initiated Date 08/31/2016  Treatment Phase Treatment  Barriers/Navigation Needs Education  Education Other  Interventions Education  Education Method Verbal  Acuity Level 1  Acuity Level 1 Minimal follow up required  Time Spent with Patient 15

## 2016-09-19 ENCOUNTER — Ambulatory Visit: Payer: PPO | Admitting: Radiation Oncology

## 2016-09-21 ENCOUNTER — Ambulatory Visit
Admission: RE | Admit: 2016-09-21 | Discharge: 2016-09-21 | Disposition: A | Discharge status: Home / Self Care | Payer: PPO | Source: Ambulatory Visit | Attending: Radiation Oncology | Admitting: Radiation Oncology

## 2016-09-21 DIAGNOSIS — Z510 Encounter for antineoplastic radiation therapy: Secondary | ICD-10-CM | POA: Diagnosis not present

## 2016-09-24 ENCOUNTER — Other Ambulatory Visit: Payer: Self-pay | Admitting: Emergency Medicine

## 2016-09-24 ENCOUNTER — Ambulatory Visit (INDEPENDENT_AMBULATORY_CARE_PROVIDER_SITE_OTHER): Payer: PPO | Admitting: Interventional Cardiology

## 2016-09-24 ENCOUNTER — Encounter: Payer: Self-pay | Admitting: Radiation Oncology

## 2016-09-24 ENCOUNTER — Ambulatory Visit
Admission: RE | Admit: 2016-09-24 | Discharge: 2016-09-24 | Disposition: A | Discharge status: Home / Self Care | Payer: PPO | Source: Ambulatory Visit | Attending: Radiation Oncology | Admitting: Radiation Oncology

## 2016-09-24 ENCOUNTER — Encounter: Payer: Self-pay | Admitting: Interventional Cardiology

## 2016-09-24 VITALS — BP 122/64 | HR 81 | Ht 71.0 in | Wt 202.2 lb

## 2016-09-24 VITALS — BP 112/73 | HR 83 | Temp 98.2°F | Resp 18 | Wt 200.2 lb

## 2016-09-24 DIAGNOSIS — I10 Essential (primary) hypertension: Secondary | ICD-10-CM

## 2016-09-24 DIAGNOSIS — Z510 Encounter for antineoplastic radiation therapy: Secondary | ICD-10-CM | POA: Diagnosis not present

## 2016-09-24 DIAGNOSIS — I2510 Atherosclerotic heart disease of native coronary artery without angina pectoris: Secondary | ICD-10-CM

## 2016-09-24 DIAGNOSIS — G4733 Obstructive sleep apnea (adult) (pediatric): Secondary | ICD-10-CM

## 2016-09-24 DIAGNOSIS — R55 Syncope and collapse: Secondary | ICD-10-CM | POA: Diagnosis not present

## 2016-09-24 DIAGNOSIS — J441 Chronic obstructive pulmonary disease with (acute) exacerbation: Secondary | ICD-10-CM

## 2016-09-24 DIAGNOSIS — Z9989 Dependence on other enabling machines and devices: Secondary | ICD-10-CM

## 2016-09-24 DIAGNOSIS — C3432 Malignant neoplasm of lower lobe, left bronchus or lung: Secondary | ICD-10-CM | POA: Diagnosis not present

## 2016-09-24 DIAGNOSIS — C3411 Malignant neoplasm of upper lobe, right bronchus or lung: Secondary | ICD-10-CM

## 2016-09-24 DIAGNOSIS — J449 Chronic obstructive pulmonary disease, unspecified: Secondary | ICD-10-CM

## 2016-09-24 MED ORDER — DILTIAZEM HCL ER COATED BEADS 120 MG PO CP24: 120.0000 mg | ORAL_CAPSULE | Freq: Every day | ORAL | 3 refills | Status: DC

## 2016-09-24 NOTE — Progress Notes (Signed)
Received patient in the clinic following final SBRT. Patient given one month follow up appointment card. Patient understands to contact this RN with future needs. Weight and vitals stable. Denies pain. Reports a productive cough with white sputum unchanged in frequency and intensity. Reports SOB with mild exertion also unchanged. Patient has COPD in addition to non small cell lung ca. Denies pain or difficulty associated with swallowing. Denies hemoptysis. Denies skin changes within treatment field. Reports moderate fatigue.   BP 112/73 (BP Location: Left Arm, Patient Position: Sitting, Cuff Size: Large)   Pulse 83   Temp 98.2 F (36.8 C) (Oral)   Resp 18   Wt 200 lb 3.2 oz (90.8 kg)   SpO2 100%   BMI 27.92 kg/m  Wt Readings from Last 3 Encounters:  09/24/16 200 lb 3.2 oz (90.8 kg)  09/24/16 202 lb 3.2 oz (91.7 kg)  08/24/16 198 lb 9 oz (90.1 kg)

## 2016-09-24 NOTE — Patient Instructions (Signed)

## 2016-09-24 NOTE — Progress Notes (Signed)
Cardiology Office Note    Date:  09/24/2016   ID:  [name], DOB 1951/05/31, MRN [id number]  PCP:  Odette Fraction, MD  Cardiologist: Sinclair Grooms, MD   Chief Complaint  Patient presents with  . Coronary Artery Disease    History of Present Illness:  [name] is a 65 y.o. male who presents for CAD with RCA DES (remote), lung cancer status post partial right lung resection, hypertension, tobacco abuse, COPD, hyperlipidemia, and essential hypertension  [name] has no CV complaints. He now has cancer in the left lung which is being treated with radiation therapy. He has not had any arrhythmias, dyspnea, chest pain, or other potential cardiac complaints.   Past Medical History:  Diagnosis Date  . Allergy   . Anemia   . Anginal pain ([place])   . Arthritis    "hands"  . Asthma   . Back pain    4 deteriorating disc and receives an injection q3-67mo;has been doing this for about 442yr . Blood transfusion    as a child  . Bronchitis   . Cancer ([place]   Non-small cell lung cancer  . Chronic kidney disease    acute kidney failure post surgery  . COPD (chronic obstructive pulmonary disease) (HCC)    uses Albuterol and Spiriva daily  . Coronary artery disease    has 1 stent  . Depression    takes Prozac daily  . Emphysema    sees Dr.Ramaswami for this  . Family history of adverse reaction to anesthesia    mother was hard to wake up sometimes  . GERD (gastroesophageal reflux disease)    takes Prilosec daily  . Glaucoma    hx of  . H/O hiatal hernia   . Headache(784.0)   . Hypertension    takes  HYzaar daily  . Insomnia    takes Trazodone nightly  . Lung mass    right upper lobe  . Myocardial infarct   . Obesity   . [place]roid disease ([place]  . Periodic limb movements of sleep   . Peripheral neuropathy ([facility]  . Pneumonia    hx of' 65 yo,rd' last time in 2012  . Productive cough    white in color but no odor  . Shortness of breath    with exertion   . Sleep apnea    uses BiPaP; "no longer have sleep apnea since I have lost over 100 lbs"  . Syncope and collapse 05/26/2013  . Thyroid disease   . Type II diabetes mellitus (HCC)    takes Metformin bid and Novolog and Lantus daily    Past Surgical History:  Procedure Laterality Date  . CARDIAC CATHETERIZATION  2006/2008/2009  . CARPAL TUNNEL RELEASE     bilateral  . COLONOSCOPY    . CORONARY ANGIOPLASTY WITH STENT PLACEMENT     1 stent  . ELBOW SURGERY     ulnar nerve; left  . ESOPHAGOGASTRODUODENOSCOPY    . EYE SURGERY     pt. denies  . FUDUCIAL PLACEMENT Left 08/24/2016   Procedure: PLACEMENT OF FUDUCIAL;  Surgeon: StMelrose NakayamaMD;  [facility];  Laterality: Left;  . INGUINAL HERNIA REPAIR  1990's   double inguinal  . LOBECTOMY  03/17/2012   upper right side with resection  . REFRACTIVE SURGERY     bilaterally  . ROTATOR CUFF REPAIR     left  . VIDEO BRONCHOSCOPY WITH  ENDOBRONCHIAL NAVIGATION N/A 08/24/2016   Procedure: VIDEO BRONCHOSCOPY WITH ENDOBRONCHIAL NAVIGATION;  Surgeon: Melrose Nakayama, MD;  Location: [facility] OR;  Service: Thoracic;  Laterality: N/A;    Current Medications: Outpatient Medications Prior to Visit  Medication Sig Dispense Refill  . acetaminophen (TYLENOL) 500 MG tablet Take 1,000 mg by mouth every 6 (six) hours as needed for moderate pain.    Marland Kitchen aspirin EC 81 MG tablet Take 81 mg by mouth daily.    Marland Kitchen atorvastatin (LIPITOR) 40 MG tablet Take 40 mg by mouth daily.    Marland Kitchen diltiazem (CARDIZEM CD) 120 MG 24 hr capsule TAKE ONE CAPSULE BY MOUTH EVERY DAY 90 capsule 0  . diphenhydramine-acetaminophen (TYLENOL PM) 25-500 MG TABS tablet Take 12 tablets by mouth at bedtime as needed (sleep).    Marland Kitchen FLUoxetine (PROZAC) 40 MG capsule TAKE ONE CAPSULE BY MOUTH EVERY DAY 90 capsule 1  . gabapentin (NEURONTIN) 300 MG capsule TAKE 1 CAPSULE BY MOUTH THREE TIMES DAILY (Patient taking differently: Take '300mg'$ s daily, may  take an additional '300mg'$ s twice daily for leg pain) 270 capsule 1  . insulin aspart (NOVOLOG) 100 UNIT/ML injection Check blood glucose three (3) times daily before meals. CBG < 70: implement hypoglycemia protocol as needed CBG 70 -120: 0 UNITS CBG 121 - 150: 5 UNITS CBG 151 - 200: 10 UNITS CBG 201 - 250: 15 UNITS CBG 251 -300: 20 UNITS CBG 301 - 350: 25 UNITS CBG 351 - 400: 30 UNITS    . ipratropium-albuterol (DUONEB) 0.5-2.5 (3) MG/3ML SOLN USE 1 VIAL IN NEBULIZER 4 TIMES A DAY 360 mL 0  . LANTUS 100 UNIT/ML injection Inject 15 Units into the skin every morning.   3  . losartan (COZAAR) 25 MG tablet Take 25 mg by mouth daily.    . Multiple Vitamin (MULTIVITAMIN WITH MINERALS) TABS Take 1 tablet by mouth daily.    . nitroGLYCERIN (NITROSTAT) 0.4 MG SL tablet Place 0.4 mg under the tongue every 5 (five) minutes as needed for chest pain.    . ranitidine (ZANTAC) 150 MG capsule Take 1 capsule (150 mg total) by mouth 2 (two) times daily. 60 capsule 11  . tiZANidine (ZANAFLEX) 4 MG tablet Take 4 mg by mouth daily. May take an additional '4mg'$ s as needed for leg cramps    . topiramate (TOPAMAX) 50 MG tablet Take 1 tablet (50 mg total) by mouth 2 (two) times daily. 360 tablet 0  . traZODone (DESYREL) 100 MG tablet TAKE ONE TABLET BY MOUTH EVERY NIGHT AT BEDTIME 90 tablet 1  . umeclidinium-vilanterol (ANORO ELLIPTA) 62.5-25 MCG/INH AEPB Inhale 1 puff into the lungs daily.     No facility-administered medications prior to visit.      Allergies:   Actos [pioglitazone]; Avandia [rosiglitazone]; and Dilaudid [hydromorphone hcl]   Social History   Social History  . Marital status: Married    Spouse name: mary lou  . Number of children: 3  . Years of education: trade    Occupational History  . truck driver     retired   Social History Main Topics  . Smoking status: Current Every Day Smoker    Packs/day: 1.50    Years: 45.00    Types: Cigarettes  . Smokeless tobacco: Never Used  . Alcohol  use Yes     Comment: gallon per week liquor "Haven't drink in 10-12 years"  . Drug use:     Types: Cocaine     Comment: quit 1990's  . Sexual activity:  Not Currently   Other Topics Concern  . 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   ROS:   Please see the history of present illness.     Dramatic weight loss. Denies syncope. No hemoptysis. Decreased appetite, occasional chills, hearing loss, vision disturbance, cough, dyspnea, abdominal pain, diarrhea, back pain, muscle pain, dizziness, easy bruising, snoring, wheezing, nausea and vomiting, and bilateral leg pain. All other systems reviewed and are negative.   PHYSICAL EXAM:   VS:  BP 122/64   Pulse 81   Ht '5\' 11"'$  (1.803 m)   Wt 202 lb 3.2 oz (91.7 kg)   BMI 28.20 kg/m    GEN: Well nourished, well developed, in no acute distress. Significant weight loss, greater than 100 pounds over the past 12 months. This is not been purposeful.  HEENT: normal  Neck: no JVD, carotid bruits, or masses Cardiac: RRR; no murmurs, rubs, or gallops,no edema  Respiratory:  clear to auscultation bilaterally, normal work of breathing GI: soft, nontender, nondistended, + BS MS: no deformity or atrophy  Skin: warm and dry, no rash Neuro:  Alert and Oriented x 3, Strength and sensation are intact Psych: euthymic mood, full affect  Wt Readings from Last 3 Encounters:  09/24/16 202 lb 3.2 oz (91.7 kg)  08/24/16 198 lb 9 oz (90.1 kg)  08/23/16 198 lb 9.6 oz (90.1 kg)      Studies/Labs Reviewed:   EKG:  EKG  Normal sinus rhythm with normal appearance  Recent Labs: 08/23/2016: ALT 20; BUN 22; Creatinine, Ser 1.40; Hemoglobin 13.9;  Platelets 221; Potassium 4.3; Sodium 138   Lipid Panel    Component Value Date/Time   CHOL 135 08/17/2016 1238   TRIG 197 (H) 08/17/2016 1238   HDL 26 (L) 08/17/2016 1238   CHOLHDL 5.2 (H) 08/17/2016 1238   VLDL 39 (H) 08/17/2016 1238   [place] 70 08/17/2016 1238    Additional studies/ records that were reviewed today include:  No new cardiac data.    ASSESSMENT:    1. Coronary artery disease involving native coronary artery of native heart without angina pectoris   2. Essential hypertension   3. Neurocardiogenic syncope   4. OSA on CPAP   5. Chronic obstructive pulmonary disease, unspecified COPD type ([place])      PLAN:  In order of problems listed above:  1. Stable without chest discomfort or nitroglycerin use. 2. Excellent control and as a matter of fact has his weight is chem down both insulin requirement and antihypertensive regimen have needed to be decreased in intensity. 3. No recurrences of syncope. 4. Not using C Pap. 5. Still smoking cigarettes. Encouraged him to discontinue.    Medication Adjustments/Labs and Tests Ordered: Current medicines are reviewed at length with the patient today.  Concerns regarding medicines are outlined above.  Medication changes, Labs and Tests ordered today are listed in the Patient Instructions below. There are no Patient Instructions on file for this visit.   Signed, Sinclair Grooms, MD  09/24/2016 12:04 PM    [name],  , [facility] [place]  [address] Phone: [phone]; Fax: [phone]  

## 2016-09-24 NOTE — Progress Notes (Signed)
  Radiation Oncology         [phone] ________________________________  Name: [name] MRN: [id number]  Date: 09/24/2016  DOB: 1951-06-04  Weekly Radiation Therapy Management    ICD-9-CM ICD-10-CM   1. Non-small cell carcinoma of lung, stage 1 (HCC) 162.3 C34.11      Current Dose: 54 Gy     Planned Dose:  54 Gy  Narrative . . . . . . . . The patient presents for routine under treatment assessment.                                                                    Set-up films were reviewed.                                 The chart was checked. Weight and vitals stable. The patient denies pain. He reports a productive cough with white sputum unchanged in frequency and intensity. The patient reports shortness of breath with mild exertion. The patient reports he has COPD in addition to non-small cell lung cancer. He denies pain or difficulty associated with swallowing. Denies hemoptysis. He reports moderate fatigue. The patient's wife is in attendance today, and she questions if the patient should continue to use his inhaler. Physical Findings. . .  weight is 200 lb 3.2 oz (90.8 kg). His oral temperature is 98.2 F (36.8 C). His blood pressure is 112/73 and his pulse is 83. His respiration is 18 and oxygen saturation is 100%. Weight essentially stable.   Cardiopulmonary assessment is negative for acute distress and he exhibits normal effort.   Impression . . . . . . . The patient is tolerating radiation. Plan . . . . . . . . . . . . The patient has completed his planned course of treatment. He will follow up in 1 month. The patient knows to contact me with any questions or concerns in the interim. I advised the patient that he could continue to use his inhaler as needed.  ________________________________   Blair Promise, PhD, MD  This document serves as a record of services personally performed by Gery Pray, MD. It was created on his behalf by Maryla Morrow, a  trained medical scribe. The creation of this record is based on the scribe's personal observations and the provider's statements to them. This document has been checked and approved by the attending provider.

## 2016-09-26 ENCOUNTER — Ambulatory Visit: Payer: PPO | Admitting: Radiation Oncology

## 2016-09-26 NOTE — Progress Notes (Signed)
  Radiation Oncology         [phone]) [phone] ________________________________  Name: [name] MRN: [id number]  Date: 09/24/2016  DOB: June 17, 1951  End of Treatment Note  Diagnosis:   65 year-old with Stage IA, T1aN0 squamous cell carcinoma of the left lower lobe of lung     Indication for treatment:  Curative, Definitive SBRT       Radiation treatment dates:   09/17/2016 to 09/24/2016  Site/dose:   The target was treated to 54 Gy in 3 fractions of 18 Gy  Beams/energy:   The patient was treated using stereotactic body radiotherapy according to a 3D conformal radiotherapy plan.  Volumetric arc fields were employed to deliver 6 MV X-rays.  Image guidance was performed with per fraction cone beam CT prior to treatment under personal MD supervision.  Immobilization was achieved using BodyFix Pillow.  Narrative: The patient tolerated radiation treatment relatively well.  He experienced a productive cough with white sputum unchanged in frequency or intensity. He reports moderate fatigue and shortness of breath with exertion.   Plan: The patient has completed radiation treatment. The patient will return to radiation oncology clinic for routine followup in one month. Patient was advised he could continue to use his inhaler as needed. I advised them to call or return sooner if they have any questions or concerns related to their recovery or treatment. ________________________________  Sheral Apley. Tammi Klippel, M.D.  This document serves as a record of services personally performed by Tyler Pita, MD. It was created on his behalf by Arlyce Harman, a trained medical scribe. The creation of this record is based on the scribe's personal observations and the provider's statements to them. This document has been checked and approved by the attending provider.

## 2016-10-06 ENCOUNTER — Other Ambulatory Visit: Payer: Self-pay | Admitting: Internal Medicine

## 2016-10-06 DIAGNOSIS — J441 Chronic obstructive pulmonary disease with (acute) exacerbation: Secondary | ICD-10-CM

## 2016-10-18 NOTE — Progress Notes (Signed)
Mr. [name]. [place] 65 year old male here for a one month S/P SBRT lung [name] a 65 year old male squamous cell carcinoma of the left lower lobe of lung..  Pain:None Swallowing problems/pain/swallowing dificulity:None Appetite:Good Weight: Wt Readings from Last 3 Encounters:  10/30/16 208 lb 12.8 oz (94.7 kg)  10/19/16 202 lb (91.6 kg)  09/24/16 200 lb 3.2 oz (90.8 kg)  Fatigue:Having fatigue most of the day and cold. BP 125/68   Pulse 73   Temp 98.2 F (36.8 C) (Oral)   Resp 20   Ht '5\' 11"'$  (1.803 m)   Wt 208 lb 12.8 oz (94.7 kg)   SpO2 98%   BMI 29.12 kg/m

## 2016-10-19 ENCOUNTER — Ambulatory Visit (INDEPENDENT_AMBULATORY_CARE_PROVIDER_SITE_OTHER): Payer: PPO | Admitting: Podiatry

## 2016-10-19 ENCOUNTER — Encounter: Payer: Self-pay | Admitting: Podiatry

## 2016-10-19 VITALS — Ht 71.0 in | Wt 202.0 lb

## 2016-10-19 DIAGNOSIS — M79676 Pain in unspecified toe(s): Secondary | ICD-10-CM

## 2016-10-19 DIAGNOSIS — B351 Tinea unguium: Secondary | ICD-10-CM | POA: Diagnosis not present

## 2016-10-19 DIAGNOSIS — E1151 Type 2 diabetes mellitus with diabetic peripheral angiopathy without gangrene: Secondary | ICD-10-CM

## 2016-10-19 NOTE — Progress Notes (Signed)
Subjective:     Patient ID: [name], male   DOB: June 22, 1951, 65 y.o.   MRN: [id number]  HPI this patient presents to the office with chief complaint of long thick painful nails. The nails have become thick and long and are painful walking and wearing his shoe.  He is diabetic.  He presents for preventive footcare services.   Review of Systems     Objective:   Physical Exam GENERAL APPEARANCE: Alert, conversant. Appropriately groomed. No acute distress.  VASCULAR: Pedal pulses are diminished but  palpable at  [facility] and PT bilateral.  Capillary refill time is immediate to all digits,  Normal temperature gradient.  Digital hair growth is present bilateral  NEUROLOGIC: sensation is normal to 5.07 monofilament at 5/5 sites bilateral.  Light touch is intact bilateral, Muscle strength normal.  MUSCULOSKELETAL: acceptable muscle strength, tone and stability bilateral.  Intrinsic muscluature intact bilateral.  Rectus appearance of foot and digits noted bilateral.   DERMATOLOGIC: skin color, texture, and turgor are within normal limits.  No preulcerative lesions or ulcers  are seen, no interdigital maceration noted.  No open lesions present.   No drainage noted.  NAILS  Thick disfigured discolored nails both feet.        Assessment:     Onychomycosis  .  Diabetic neuropathy.      Plan:     Debride nails B/L   RTC 3 months for nail care.    Gardiner Barefoot DPM

## 2016-10-30 ENCOUNTER — Encounter: Payer: Self-pay | Admitting: Radiation Oncology

## 2016-10-30 ENCOUNTER — Ambulatory Visit
Admission: RE | Admit: 2016-10-30 | Discharge: 2016-10-30 | Disposition: A | Discharge status: Home / Self Care | Payer: PPO | Source: Ambulatory Visit | Attending: Radiation Oncology | Admitting: Radiation Oncology

## 2016-10-30 VITALS — BP 125/68 | HR 73 | Temp 98.2°F | Resp 20 | Ht 71.0 in | Wt 208.8 lb

## 2016-10-30 DIAGNOSIS — Z923 Personal history of irradiation: Secondary | ICD-10-CM | POA: Insufficient documentation

## 2016-10-30 DIAGNOSIS — Z7982 Long term (current) use of aspirin: Secondary | ICD-10-CM | POA: Insufficient documentation

## 2016-10-30 DIAGNOSIS — Z885 Allergy status to narcotic agent status: Secondary | ICD-10-CM | POA: Insufficient documentation

## 2016-10-30 DIAGNOSIS — Z888 Allergy status to other drugs, medicaments and biological substances status: Secondary | ICD-10-CM | POA: Diagnosis not present

## 2016-10-30 DIAGNOSIS — C3432 Malignant neoplasm of lower lobe, left bronchus or lung: Secondary | ICD-10-CM | POA: Insufficient documentation

## 2016-10-30 DIAGNOSIS — Z794 Long term (current) use of insulin: Secondary | ICD-10-CM | POA: Diagnosis not present

## 2016-10-30 DIAGNOSIS — C3411 Malignant neoplasm of upper lobe, right bronchus or lung: Secondary | ICD-10-CM

## 2016-10-30 DIAGNOSIS — J449 Chronic obstructive pulmonary disease, unspecified: Secondary | ICD-10-CM | POA: Diagnosis not present

## 2016-10-30 DIAGNOSIS — Z79899 Other long term (current) drug therapy: Secondary | ICD-10-CM | POA: Diagnosis not present

## 2016-10-30 NOTE — Progress Notes (Signed)
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  He did very well and tolerating radiotherapy, and did not have any difficulty with skin disruption, changes in breathing habits, or need for oxygen.                              On review of systems, the patient states he is feeling great. His stamina is slowly improving and he is about 8 pounds since his last visit. He is interested in getting back into an exercise regimen and would like to continue to improve his energy and stamina. He denies any shortness of breath or chest pain. He is not experiencing any Greener yellow productive mucus but does have some white or clear sputum when he coughs. No other complaints or verbalized.  ALLERGIES:  is allergic to actos [pioglitazone]; avandia [rosiglitazone]; and dilaudid [hydromorphone hcl].  Meds: Current Outpatient Prescriptions  Medication Sig Dispense Refill  . acetaminophen (TYLENOL) 500 MG tablet Take 1,000 mg by mouth every 6 (six) hours as needed for moderate pain.    Marland Kitchen aspirin EC 81 MG tablet Take 81 mg by mouth daily.    Marland Kitchen atorvastatin (LIPITOR) 40 MG tablet Take 40 mg by mouth daily.    Marland Kitchen diltiazem (CARDIZEM CD) 120 MG 24 hr capsule Take 1 capsule (120 mg total) by mouth daily. 90 capsule 3  . diphenhydramine-acetaminophen (TYLENOL PM) 25-500 MG TABS tablet Take 12 tablets by mouth at bedtime as needed (sleep).    Marland Kitchen FLUoxetine (PROZAC) 40 MG capsule TAKE ONE CAPSULE BY MOUTH EVERY DAY 90 capsule 1  . gabapentin  (NEURONTIN) 300 MG capsule TAKE 1 CAPSULE BY MOUTH THREE TIMES DAILY (Patient taking differently: Take '300mg'$ s daily, may take an additional '300mg'$ s twice daily for leg pain) 270 capsule 1  . insulin aspart (NOVOLOG) 100 UNIT/ML injection Check blood glucose three (3) times daily before meals. CBG < 70: implement hypoglycemia protocol as needed CBG 70 -120: 0 UNITS CBG 121 - 150: 5 UNITS CBG 151 - 200: 10 UNITS CBG 201 - 250: 15 UNITS CBG 251 -300: 20 UNITS CBG 301 - 350: 25 UNITS CBG 351 - 400: 30 UNITS    . ipratropium-albuterol (DUONEB) 0.5-2.5 (3) MG/3ML SOLN USE 1 VIAL IN NEBULIZER 4 TIMES A DAY 360 mL 0  . LANTUS 100 UNIT/ML injection Inject 15 Units into the skin every morning.   3  . losartan (COZAAR) 25 MG tablet Take 25 mg by mouth daily.    . Multiple Vitamin (MULTIVITAMIN WITH MINERALS) TABS Take 1 tablet by mouth daily.    . ranitidine (ZANTAC) 150 MG capsule Take 1 capsule (150 mg total) by mouth 2 (two) times daily. 60 capsule 11  . tiZANidine (ZANAFLEX) 4 MG tablet Take 4 mg by mouth daily. May take an additional '4mg'$ s as needed for leg cramps    . topiramate (TOPAMAX) 50 MG tablet Take 1 tablet (50 mg  total) by mouth 2 (two) times daily. 360 tablet 0  . traZODone (DESYREL) 100 MG tablet TAKE ONE TABLET BY MOUTH EVERY NIGHT AT BEDTIME 90 tablet 1  . nitroGLYCERIN (NITROSTAT) 0.4 MG SL tablet Place 0.4 mg under the tongue every 5 (five) minutes as needed for chest pain.    Marland Kitchen umeclidinium-vilanterol (ANORO ELLIPTA) 62.5-25 MCG/INH AEPB Inhale 1 puff into the lungs daily.     No current facility-administered medications for this encounter.     Physical Findings:  height is '5\' 11"'$  (1.803 m) and weight is 208 lb 12.8 oz (94.7 kg). His oral temperature is 98.2 F (36.8 C). His blood pressure is 125/68 and his pulse is 73. His respiration is 20 and oxygen saturation is 98%.  In general this is a well appearing Caucasian male in no acute distress. He's alert and oriented x4 and  appropriate throughout the examination. Cardiopulmonary assessment is negative for acute distress and he exhibits normal effort.   Lab Findings: Lab Results  Component Value Date   WBC 14.8 (H) 08/23/2016   HGB 13.9 08/23/2016   HCT 40.4 08/23/2016   MCV 91.2 08/23/2016   PLT 221 08/23/2016     Radiographic Findings: No results found.  Impression/Plan: 30. 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 9 the patient appears to be doing well since completion of radiotherapy. We discussed the role for repeating imaging, I will follow-up with these results by phone, and provided that stability or improvement of his disease is seen, we will plan a repeat image in 6 months time and meet back face to face to discuss this.  2. COPD. The patient does have a history of COPD, and does use a nebulizer regularly. He is a patient of Dr. Chase Caller, and will get back on Dr. Mancel Bale on schedule. I would be happy to help prescribed medication as needed for Dr. Chase Caller following for selection of regarding his COPD management.     3. Cardiovascular exercise. The patient is interested in meeting with someone to discuss how he should get back into a regular exercise routine. We discussed the lids program through the [facility], a permission slip was signed and provided to the patient as well as a brochure with contact information to determine the closest [facility] that had this program starting in the next few weeks. We will follow this expectantly.     Carola Rhine, PAC

## 2016-10-30 NOTE — Addendum Note (Signed)
Encounter addended by: Malena Edman, RN on: 10/30/2016  4:12 PM<BR>    Actions taken: Charge Capture section accepted

## 2016-10-31 ENCOUNTER — Telehealth: Payer: Self-pay | Admitting: *Deleted

## 2016-10-31 NOTE — Telephone Encounter (Signed)
CALLED PATIENT TO INFORM OF LAB ON 11-02-16 - @ 2:15 PM @ [place] HIS CT ON 11-02-16 - ARRIVAL TIME - 3:15 PM , PT. TO HAVE CLEAR LIQUIDS ONLY 4 HRS. PRIOR TO TEST,NO ANSWER WILL CALL LATER.

## 2016-10-31 NOTE — Telephone Encounter (Signed)
CALLED PATIENT TO INFORM OF LABS ON 11-02-16 AND  HIS CT ON 11-02-16, SPOKE WITH PATIENT AND HE IS AWARE OF THESE APPTS.

## 2016-11-02 ENCOUNTER — Encounter (HOSPITAL_COMMUNITY): Payer: Self-pay

## 2016-11-02 ENCOUNTER — Ambulatory Visit
Admission: RE | Admit: 2016-11-02 | Discharge: 2016-11-02 | Disposition: A | Discharge status: Home / Self Care | Payer: PPO | Source: Ambulatory Visit | Attending: Radiation Oncology | Admitting: Radiation Oncology

## 2016-11-02 ENCOUNTER — Ambulatory Visit (HOSPITAL_COMMUNITY)
Admission: RE | Admit: 2016-11-02 | Discharge: 2016-11-02 | Disposition: A | Discharge status: Home / Self Care | Payer: PPO | Source: Ambulatory Visit | Attending: Radiation Oncology | Admitting: Radiation Oncology

## 2016-11-02 DIAGNOSIS — J432 Centrilobular emphysema: Secondary | ICD-10-CM | POA: Diagnosis not present

## 2016-11-02 DIAGNOSIS — C3411 Malignant neoplasm of upper lobe, right bronchus or lung: Secondary | ICD-10-CM | POA: Diagnosis not present

## 2016-11-02 DIAGNOSIS — R918 Other nonspecific abnormal finding of lung field: Secondary | ICD-10-CM | POA: Diagnosis not present

## 2016-11-02 DIAGNOSIS — R0602 Shortness of breath: Secondary | ICD-10-CM | POA: Diagnosis not present

## 2016-11-02 LAB — BASIC METABOLIC PANEL
ANION GAP: 10 meq/L (ref 3–11)
BUN: 32.5 mg/dL — AB (ref 7.0–26.0)
CALCIUM: 9.1 mg/dL (ref 8.4–10.4)
CO2: 20 meq/L — AB (ref 22–29)
Chloride: 109 mEq/L (ref 98–109)
Creatinine: 1.6 mg/dL — ABNORMAL HIGH (ref 0.7–1.3)
EGFR: 45 mL/min/{1.73_m2} — ABNORMAL LOW (ref >90)
GLUCOSE: 115 mg/dL (ref 70–140)
Potassium: 4.6 mEq/L (ref 3.5–5.1)
Sodium: 140 mEq/L (ref 136–145)

## 2016-11-02 MED ORDER — IOPAMIDOL (ISOVUE-300) INJECTION 61%
75.0000 mL | Freq: Once | INTRAVENOUS | Status: AC | PRN
  Administered 2016-11-02: 60 mL via INTRAVENOUS

## 2016-11-02 MED ORDER — IOPAMIDOL (ISOVUE-300) INJECTION 61%
INTRAVENOUS | Status: AC
  Filled 2016-11-02: qty 75

## 2016-11-06 ENCOUNTER — Telehealth: Payer: Self-pay | Admitting: Radiation Oncology

## 2016-11-06 DIAGNOSIS — C3411 Malignant neoplasm of upper lobe, right bronchus or lung: Secondary | ICD-10-CM

## 2016-11-06 NOTE — Telephone Encounter (Signed)
I called and spoke with the patient regarding his CT scan results and needs for repeat labs/scan in 6 months as well. We reviewed his BUN/Creatinine and he will need to increase oral hydration. Orders placed.

## 2016-11-07 ENCOUNTER — Other Ambulatory Visit: Payer: Self-pay | Admitting: Family Medicine

## 2016-11-08 ENCOUNTER — Telehealth: Payer: Self-pay | Admitting: Family Medicine

## 2016-11-08 NOTE — Telephone Encounter (Signed)
rec'd auth # [id number] for req provider Dr Tammi Klippel for CT Thorax w/contrast. CPT [phone]  Dx C34.32 dates 10/31/16 - 04/28/17.

## 2016-11-12 DIAGNOSIS — Z720 Tobacco use: Secondary | ICD-10-CM | POA: Diagnosis not present

## 2016-11-12 DIAGNOSIS — E1142 Type 2 diabetes mellitus with diabetic polyneuropathy: Secondary | ICD-10-CM | POA: Diagnosis not present

## 2016-11-12 DIAGNOSIS — Z794 Long term (current) use of insulin: Secondary | ICD-10-CM | POA: Diagnosis not present

## 2016-12-01 ENCOUNTER — Other Ambulatory Visit: Payer: Self-pay | Admitting: Interventional Cardiology

## 2016-12-03 NOTE — Telephone Encounter (Signed)
Medication Detail    Disp Refills Start End   diltiazem (CARDIZEM CD) 120 MG 24 hr capsule 90 capsule 3 09/24/2016    Sig - Route: Take 1 capsule (120 mg total) by mouth daily. - Oral   E-Prescribing Status: Receipt confirmed by pharmacy (09/24/2016 12:15 PM EST)   Pharmacy   [id number] [place] [facility] [address]

## 2016-12-05 ENCOUNTER — Other Ambulatory Visit: Payer: Self-pay | Admitting: Family Medicine

## 2016-12-24 ENCOUNTER — Other Ambulatory Visit: Payer: Self-pay | Admitting: Interventional Cardiology

## 2017-01-10 ENCOUNTER — Ambulatory Visit (INDEPENDENT_AMBULATORY_CARE_PROVIDER_SITE_OTHER): Payer: PPO | Admitting: Internal Medicine

## 2017-01-10 ENCOUNTER — Encounter: Payer: Self-pay | Admitting: Internal Medicine

## 2017-01-10 DIAGNOSIS — J449 Chronic obstructive pulmonary disease, unspecified: Secondary | ICD-10-CM

## 2017-01-10 DIAGNOSIS — F17200 Nicotine dependence, unspecified, uncomplicated: Secondary | ICD-10-CM | POA: Diagnosis not present

## 2017-01-10 DIAGNOSIS — J441 Chronic obstructive pulmonary disease with (acute) exacerbation: Secondary | ICD-10-CM

## 2017-01-10 MED ORDER — IPRATROPIUM-ALBUTEROL 0.5-2.5 (3) MG/3ML IN SOLN: RESPIRATORY_TRACT | 0 refills | Status: DC

## 2017-01-10 MED ORDER — TIOTROPIUM BROMIDE-OLODATEROL 2.5-2.5 MCG/ACT IN AERS: 2.0000 | INHALATION_SPRAY | Freq: Every day | RESPIRATORY_TRACT | 0 refills | Status: DC

## 2017-01-10 MED ORDER — DOXYCYCLINE HYCLATE 100 MG PO TABS: 100.0000 mg | ORAL_TABLET | Freq: Two times a day (BID) | ORAL | 0 refills | Status: DC

## 2017-01-10 MED ORDER — PREDNISONE 10 MG PO TABS: ORAL_TABLET | ORAL | 0 refills | Status: DC

## 2017-01-10 NOTE — Assessment & Plan Note (Signed)
I tink there is mild to moderate aecopd  Plan Take prednisone 40 mg daily x 2 days, then '20mg'$  daily x 2 days, then '10mg'$  daily x 2 days, then '5mg'$  daily x 2 days and stop  Take doxycycline '100mg'$  po twice daily x 5 days; take after meals and avoid sunlight  Followup  after pft but in a few weeks to see DR rAmsawamy or an APP

## 2017-01-10 NOTE — Assessment & Plan Note (Signed)
Start STIOLTO samples (similar to [facility]) daily In next few to several weeks: do  -  .Pre-bd spiro and dlco only. No lung volume or bd response.

## 2017-01-10 NOTE — Patient Instructions (Signed)
CIGARETTE SMOKER Appreciate efforts at quitting smoking  COPD (chronic obstructive pulmonary disease) Start STIOLTO samples (similar to [facility]) daily In next few to several weeks: do  -  .Pre-bd spiro and dlco only. No lung volume or bd response.    COPD exacerbation ([place]) I tink there is mild to moderate aecopd  Plan Take prednisone 40 mg daily x 2 days, then '20mg'$  daily x 2 days, then '10mg'$  daily x 2 days, then '5mg'$  daily x 2 days and stop  Take doxycycline '100mg'$  po twice daily x 5 days; take after meals and avoid sunlight  Followup  after pft but in a few weeks to see DR rAmsawamy or an APP

## 2017-01-10 NOTE — Progress Notes (Signed)
Subjective:     Patient ID: [name], male   DOB: 1951-09-25, 66 y.o.   MRN: [id number]  PCP [facility] TOM, MD  HPI   1. tobacco abuse (wellbutrin intolerance hx - insomnia in early 2011, chantix intolerance )  - quit Nov 2012 after admission for pna - relapsed Jan/FEb 2013. Quit May 2013 after lobectomy for cancer - e-cigs as of Aug and Oct 2013 - back to regular cigs - June 2013  2A. 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 AECOPD - Nov 2012 admit for pneumonia  - Jan 2013 OPD Rx  - March 2013 - presumed opd Rx - February 2014-admission for COPD exacerbation  3. Mediastinal nodes Stable Oct 2009 - July 2011; no further fu  4.RUL pulmonary nodule with stage IA non-small cell lung cancer - May 2013  - - May 2013  - 1.9cm T1a, N0, M0  Stage 1A NSCLC with 0.3cm carcinoid - s/p lobectomy - December 2013 CT chest: No recurrence - July 2014 CT chest - no recurrence. RML nodule stabvle since Dec 2013 - JAn 2016 - no recurrence  4. LLL LB6 nodule - July 2015 - new LLL LB6 nodule - Jan 2016 - no recurrence but has LLL nodule - worrisome for cancer   5. Obesity/osa on cpap and cough  Body mass index is 42.40 kg/(m^2). on 12/21/2011 Body mass index is 39.35 kg/(m^2). on 01/14/2013 Body mass index is 38.34 kg/(m^2). on 04/30/2013 Body mass index is 38.65 kg/(m^2). pn 06/15/2013 Body mass index is 37.12 kg/(m^2). 02/14/2015 Body mass index is 34.55 kg/(m^2). 08/17/2015    OV 02/14/2015  Chief Complaint  Patient presents with  . Follow-up    Pt last seen on 06/15/2013 by MR. Pt here for f/u and to be requalified for O2. Pt states his breathing has worsened since last OV. Pt c/o DOE, prod cough with  white mucus and chest pain when active.    Follow-up  - COPD: I'm not seen him in one and half years. COPD stable. No interim exacerbations. COPD cat score shows significant symptom burden but this is baseline. It is documented below. He is compliant with his inhalers. He is here for oxygen recertification. He does not feel he needs oxygen. In fact when we walked him 185 feet 3 laps on room air at rest he did not desaturate. He is wondering if he needs nocturnal oxygen as well.  Sleep apnea: He uses CPAP at night. He also has a BiPAP machine. Unclear which one he uses. In addition uses oxygen at night. His insurance is asking to recertify for his sleep apnea but he does not want to because of cost. He does not want to see a sleep Dr. Because of cost. He says he will be content using his CPAP at night and just returned as BiPAP. His only question is whether he would need oxygen or not and he is open to using it if he desaturates  Smoking: He continues to smoke a few cigarettes here and there:    IOV 01/10/2017  Chief Complaint  Patient presents with  . Follow-up    last seen 08/2015- pt c/o worsening sob, chest tightness, sinus  congestion, prod cough with white mucus Xseveral months.     Follow-up COPD not otherwise specified   He presents with his wife Jobe Gibbon .Marland Kitchen Again it is almost 1-1/2 years since I saw him. In the interim he is lost 100 pounds weight intentionally through dietary restriction. This has helped him by improving his energy levels and also getting down on the insulin regimen he needs. He's also off oxygen and CPAP. However he developed a malignant nodule in the left lung status post radiation ending November/December 2017. The December 2017 CT chest as documented below. He tells me during the time of radiation he did take a sample inhaler of the long-acting beta agonist with long-acting anticholinergic given by his primary care physician and this did help significantly. Currently  he is on 219 which he only takes 3 times a week because of cost issues. This does help him but he is less compliant. He is now complaining of couple months of insidious onset of congestion, cough or wheezing associated white sputum that is more than baseline.  Walking desaturation test on an 85 feet 3 laps on room air   In terms of smoking: Despite significant gains in his overall health status with the weight loss is yet to quit smoking completely.   1. Subtle morphologic changes in the previously noted nodule in the superior segment of the left lower lobe, presumably reflective of evolving postradiation changes. 2. No new suspicious appearing pulmonary nodules or masses are noted. 3. Patchy areas of ground-glass attenuation and subtle septal thickening noted throughout the mid to lower lungs bilaterally. This may reflect evolving postradiation changes, or could be indicative of developing interstitial lung disease. Attention on followup studies is recommended. 4. Mild diffuse bronchial wall thickening with mild centrilobular and paraseptal emphysema; imaging findings suggestive of underlying COPD. 5. Additional incidental findings, as above.   Electronically Signed   By: Vinnie Langton M.D.   On: 11/02/2016 16:24   has a past medical history of Allergy; Anemia; Anginal pain ([place]); Arthritis; Asthma; Back pain; Blood transfusion; Bronchitis; Cancer ([place]); Chronic kidney disease; COPD (chronic obstructive pulmonary disease) ([place]); Coronary artery disease; Depression; Emphysema; Family history of adverse reaction to anesthesia; GERD (gastroesophageal reflux disease); Glaucoma; H/O hiatal hernia; Headache(784.0); Hypertension; Insomnia; Lung mass; Myocardial infarct; Obesity; Parathyroid disease ([place]); Periodic limb movements of sleep; Peripheral neuropathy ([place]); Pneumonia; Productive cough; Shortness of breath; Sleep apnea; Syncope and collapse (05/26/2013); Thyroid disease; and Type  II diabetes mellitus ([place]).   reports that he has been smoking Cigarettes.  He has a 90.00 pack-year smoking history. He has never used smokeless tobacco.  Past Surgical History:  Procedure Laterality Date  . CARDIAC CATHETERIZATION  2006/2008/2009  . CARPAL TUNNEL RELEASE     bilateral  . COLONOSCOPY    . CORONARY ANGIOPLASTY WITH STENT PLACEMENT     1 stent  . ELBOW SURGERY     ulnar nerve; left  . ESOPHAGOGASTRODUODENOSCOPY    . EYE SURGERY     pt. denies  . FUDUCIAL PLACEMENT Left 08/24/2016   Procedure: PLACEMENT OF FUDUCIAL;  Surgeon: Melrose Nakayama, MD;  Location: [facility];  Service: Thoracic;  Laterality: Left;  . INGUINAL HERNIA REPAIR  1990's   double inguinal  . LOBECTOMY  03/17/2012   upper right side with resection  . REFRACTIVE SURGERY     bilaterally  . ROTATOR CUFF REPAIR     left  . VIDEO BRONCHOSCOPY WITH ENDOBRONCHIAL NAVIGATION N/A 08/24/2016   Procedure: VIDEO  BRONCHOSCOPY WITH ENDOBRONCHIAL NAVIGATION;  Surgeon: Melrose Nakayama, MD;  [facility];  Service: Thoracic;  Laterality: N/A;    Allergies  Allergen Reactions  . Actos [Pioglitazone] Other (See Comments)    EDEMA REACTION UNSPECIFIED  . Avandia [Rosiglitazone] Other (See Comments)    edema  . Dilaudid [Hydromorphone Hcl] Other (See Comments)    Made him crazy    Immunization History  Administered Date(s) Administered  . Influenza Split 08/21/2012, 08/25/2013  . Influenza Whole 06/06/2009, 07/07/2011  . Influenza,inj,Quad PF,36+ Mos 10/25/2014, 08/17/2016  . Influenza-Unspecified 08/09/2010  . Pneumococcal Conjugate-13 10/02/2013  . Pneumococcal Polysaccharide-23 11/11/2008  . Tdap 09/09/2012  . Zoster 09/09/2012    Family History  Problem Relation Age of Onset  . COPD Mother   . Diabetes Mother   . Hypertension Mother   . Heart attack Father   . Diabetes Father   . Anesthesia problems Neg Hx   . Hypotension Neg Hx   . Malignant hyperthermia Neg Hx   . Pseudochol  deficiency Neg Hx      Current Outpatient Prescriptions:  .  acetaminophen (TYLENOL) 500 MG tablet, Take 1,000 mg by mouth every 6 (six) hours as needed for moderate pain., Disp: , Rfl:  .  aspirin EC 81 MG tablet, Take 81 mg by mouth daily., Disp: , Rfl:  .  atorvastatin (LIPITOR) 40 MG tablet, Take 40 mg by mouth daily., Disp: , Rfl:  .  diltiazem (CARDIZEM CD) 120 MG 24 hr capsule, Take 1 capsule (120 mg total) by mouth daily., Disp: 90 capsule, Rfl: 3 .  diphenhydramine-acetaminophen (TYLENOL PM) 25-500 MG TABS tablet, Take 12 tablets by mouth at bedtime as needed (sleep)., Disp: , Rfl:  .  FLUoxetine (PROZAC) 40 MG capsule, TAKE ONE CAPSULE BY MOUTH EVERY DAY, Disp: 90 capsule, Rfl: 1 .  gabapentin (NEURONTIN) 300 MG capsule, TAKE 1 CAPSULE BY MOUTH THREE TIMES DAILY, Disp: 270 capsule, Rfl: 1 .  insulin aspart (NOVOLOG) 100 UNIT/ML injection, Check blood glucose three (3) times daily before meals. CBG < 70: implement hypoglycemia protocol as needed CBG 70 -120: 0 UNITS CBG 121 - 150: 5 UNITS CBG 151 - 200: 10 UNITS CBG 201 - 250: 15 UNITS CBG 251 -300: 20 UNITS CBG 301 - 350: 25 UNITS CBG 351 - 400: 30 UNITS, Disp: , Rfl:  .  ipratropium-albuterol (DUONEB) 0.5-2.5 (3) MG/3ML SOLN, USE 1 VIAL IN NEBULIZER 4 TIMES A DAY, Disp: 360 mL, Rfl: 0 .  LANTUS 100 UNIT/ML injection, Inject 15 Units into the skin every morning. , Disp: , Rfl: 3 .  losartan (COZAAR) 25 MG tablet, Take 25 mg by mouth daily., Disp: , Rfl:  .  Multiple Vitamin (MULTIVITAMIN WITH MINERALS) TABS, Take 1 tablet by mouth daily., Disp: , Rfl:  .  nitroGLYCERIN (NITROSTAT) 0.4 MG SL tablet, Place 0.4 mg under the tongue every 5 (five) minutes as needed for chest pain., Disp: , Rfl:  .  ranitidine (ZANTAC) 150 MG capsule, Take 1 capsule (150 mg total) by mouth 2 (two) times daily., Disp: 60 capsule, Rfl: 11 .  tiZANidine (ZANAFLEX) 4 MG tablet, Take 4 mg by mouth daily. May take an additional '4mg'$ s as needed for leg cramps, Disp: ,  Rfl:  .  topiramate (TOPAMAX) 50 MG tablet, Take 1 tablet (50 mg total) by mouth 2 (two) times daily., Disp: 360 tablet, Rfl: 0 .  traZODone (DESYREL) 100 MG tablet, TAKE ONE TABLET BY MOUTH EVERY NIGHT AT BEDTIME, Disp: 90 tablet,  Rfl: 1   Review of Systems     Objective:   Physical Exam  Constitutional: He is oriented to person, place, and time. He appears well-developed and well-nourished. No distress.  Lost significant amount of weight  HENT:  Head: Normocephalic and atraumatic.  Right Ear: External ear normal.  Left Ear: External ear normal.  Mouth/Throat: Oropharynx is clear and moist. No oropharyngeal exudate.  Eyes: Conjunctivae and EOM are normal. Pupils are equal, round, and reactive to light. Right eye exhibits no discharge. Left eye exhibits no discharge. No scleral icterus.  Neck: Normal range of motion. Neck supple. No JVD present. No tracheal deviation present. No thyromegaly present.  Cardiovascular: Normal rate, regular rhythm and intact distal pulses.  Exam reveals no gallop and no friction rub.   No murmur heard. Pulmonary/Chest: Effort normal. No respiratory distress. He has wheezes. He has rales. He exhibits no tenderness.  He has positive wheezing particularly in the right upper lobe area but otherwise overall diminished air entry with left lower lobe crackles  Abdominal: Soft. Bowel sounds are normal. He exhibits no distension and no mass. There is no tenderness. There is no rebound and no guarding.  Musculoskeletal: Normal range of motion. He exhibits no edema or tenderness.  Lymphadenopathy:    He has no cervical adenopathy.  Neurological: He is alert and oriented to person, place, and time. He has normal reflexes. No cranial nerve deficit. Coordination normal.  Skin: Skin is warm and dry. No rash noted. He is not diaphoretic. No erythema. No pallor.  Psychiatric: He has a normal mood and affect. His behavior is normal. Judgment and thought content normal.   Nursing note and vitals reviewed.   Vitals:   01/10/17 1053  BP: 116/64  Pulse: (!) 52  SpO2: 91%  Weight: 196 lb (88.9 kg)  Height: '5\' 11"'$  (1.803 m)    Estimated body mass index is 27.34 kg/m as calculated from the following:   Height as of this encounter: '5\' 11"'$  (1.803 m).   Weight as of this encounter: 196 lb (88.9 kg).      Assessment:       ICD-9-CM ICD-10-CM   1. CIGARETTE SMOKER 305.1 F17.200   2. Chronic obstructive pulmonary disease, unspecified COPD type ([place]) 496 J44.9   3. COPD exacerbation (HCC) 491.21 J44.1   CIGARETTE SMOKER Appreciate efforts at quitting smoking  COPD (chronic obstructive pulmonary disease) Start STIOLTO samples (similar to [facility]) daily In next few to several weeks: do  -  .Pre-bd spiro and dlco only. No lung volume or bd response.    COPD exacerbation ([place]) I tink there is mild to moderate aecopd  Plan Take prednisone 40 mg daily x 2 days, then '20mg'$  daily x 2 days, then '10mg'$  daily x 2 days, then '5mg'$  daily x 2 days and stop  Take doxycycline '100mg'$  po twice daily x 5 days; take after meals and avoid sunlight  Followup  after pft but in a few weeks to see DR rAmsawamy or an APP       Plan:     Pu  CIGARETTE SMOKER Appreciate efforts at quitting smoking  COPD (chronic obstructive pulmonary disease) Start STIOLTO samples [facility]) daily In next few to several weeks: do  -  .Pre-bd spiro and dlco only. No lung volume or bd response.    COPD exacerbation ([place]) I tink there is mild to moderate aecopd  Plan Take prednisone 40 mg daily x 2 days, then '20mg'$  daily x 2  days, then '10mg'$  daily x 2 days, then '5mg'$  daily x 2 days and stop  Take doxycycline '100mg'$  po twice daily x 5 days; take after meals and avoid sunlight  Followup  after pft but in a few weeks to see DR rAmsawamy or an APP    Dr. Brand Males, M.D., [facility].C.P Pulmonary and Critical Care Medicine Staff Physician [place]  Pulmonary and Critical Care Pager: [phone], If no answer or between  15:00h - 7:00h: call [phone]  01/10/2017 11:48 AM   ckett, Norva Riffle

## 2017-01-10 NOTE — Assessment & Plan Note (Signed)
Appreciate efforts at quitting smoking

## 2017-01-10 NOTE — Progress Notes (Signed)
Patient seen in the office today and instructed on use of Stiolto.  Patient expressed understanding and demonstrated technique. Benetta Spar [facility] 01/10/17

## 2017-01-11 ENCOUNTER — Ambulatory Visit: Payer: PPO | Admitting: Podiatry

## 2017-01-21 DIAGNOSIS — H524 Presbyopia: Secondary | ICD-10-CM | POA: Diagnosis not present

## 2017-01-21 DIAGNOSIS — H25013 Cortical age-related cataract, bilateral: Secondary | ICD-10-CM | POA: Diagnosis not present

## 2017-01-21 DIAGNOSIS — H2513 Age-related nuclear cataract, bilateral: Secondary | ICD-10-CM | POA: Diagnosis not present

## 2017-01-21 DIAGNOSIS — E119 Type 2 diabetes mellitus without complications: Secondary | ICD-10-CM | POA: Diagnosis not present

## 2017-01-21 LAB — HM DIABETES EYE EXAM

## 2017-01-24 ENCOUNTER — Encounter: Payer: Self-pay | Admitting: Family Medicine

## 2017-01-25 ENCOUNTER — Encounter: Payer: Self-pay | Admitting: Podiatry

## 2017-01-25 ENCOUNTER — Ambulatory Visit (INDEPENDENT_AMBULATORY_CARE_PROVIDER_SITE_OTHER): Payer: PPO | Admitting: Podiatry

## 2017-01-25 DIAGNOSIS — B351 Tinea unguium: Secondary | ICD-10-CM

## 2017-01-25 DIAGNOSIS — M79676 Pain in unspecified toe(s): Secondary | ICD-10-CM | POA: Diagnosis not present

## 2017-01-25 DIAGNOSIS — E1149 Type 2 diabetes mellitus with other diabetic neurological complication: Secondary | ICD-10-CM

## 2017-01-25 NOTE — Progress Notes (Signed)
Subjective:     Patient ID: [name], male   DOB: [birth year], 66 y.o.   MRN: [id number]  HPI this patient presents to the office with chief complaint of long thick painful nails. The nails have become thick and long and are painful walking and wearing his shoe.  He is diabetic.  He presents for preventive footcare services.   Review of Systems     Objective:   Physical Exam GENERAL APPEARANCE: Alert, conversant. Appropriately groomed. No acute distress.  VASCULAR: Pedal pulses are diminished but  palpable at  [facility] and PT bilateral.  Capillary refill time is immediate to all digits,  Normal temperature gradient.  Digital hair growth is present bilateral  NEUROLOGIC: sensation is normal to 5.07 monofilament at 5/5 sites bilateral.  Light touch is intact bilateral, Muscle strength normal.  MUSCULOSKELETAL: acceptable muscle strength, tone and stability bilateral.  Intrinsic muscluature intact bilateral.  Rectus appearance of foot and digits noted bilateral. HAV  B/L.  Disdifured distal phalanx right left great toe.  DERMATOLOGIC: skin color, texture, and turgor are within normal limits.  No preulcerative lesions or ulcers  are seen, no interdigital maceration noted.  No open lesions present.   No drainage noted.  NAILS  Thick disfigured discolored nails both feet.        Assessment:     Onychomycosis  .  Diabetic neuropathy.      Plan:     Debride nails B/L   RTC 3 months for nail care. Initiate diabetic footwear for DPN and  HAV  B/L.    Gardiner Barefoot DPM

## 2017-01-31 ENCOUNTER — Telehealth: Payer: Self-pay | Admitting: Internal Medicine

## 2017-01-31 ENCOUNTER — Ambulatory Visit (INDEPENDENT_AMBULATORY_CARE_PROVIDER_SITE_OTHER): Payer: PPO | Admitting: Internal Medicine

## 2017-01-31 DIAGNOSIS — J441 Chronic obstructive pulmonary disease with (acute) exacerbation: Secondary | ICD-10-CM | POA: Diagnosis not present

## 2017-01-31 LAB — PULMONARY FUNCTION TEST
DL/VA % PRED: 61 %
DL/VA: 2.82 ml/min/mmHg/L
DLCO COR % PRED: 57 %
DLCO cor: 17.81 ml/min/mmHg
DLCO unc % pred: 56 %
DLCO unc: 17.61 ml/min/mmHg
FEF 25-75 Pre: 1.47 L/sec
FEF2575-%Pred-Pre: 57 %
FEV1-%Pred-Pre: 87 %
FEV1-PRE: 2.85 L
FEV1FVC-%Pred-Pre: 86 %
FEV6-%Pred-Pre: 105 %
FEV6-Pre: 4.36 L
FEV6FVC-%Pred-Pre: 103 %
FVC-%PRED-PRE: 102 %
FVC-PRE: 4.46 L
PRE FEV1/FVC RATIO: 64 %
Pre FEV6/FVC Ratio: 98 %

## 2017-01-31 NOTE — Progress Notes (Signed)
PFT done today. 

## 2017-01-31 NOTE — Telephone Encounter (Signed)
Spoke with pt. Pt is requesting a stiolto sample, one sample given to pt. Pt has an upcoming appt with TP. Nothing further needed at this time.

## 2017-02-06 ENCOUNTER — Other Ambulatory Visit: Payer: Self-pay | Admitting: Family Medicine

## 2017-02-06 ENCOUNTER — Encounter: Payer: Self-pay | Admitting: Adult Health

## 2017-02-06 ENCOUNTER — Ambulatory Visit (INDEPENDENT_AMBULATORY_CARE_PROVIDER_SITE_OTHER): Payer: PPO | Admitting: Adult Health

## 2017-02-06 DIAGNOSIS — C3411 Malignant neoplasm of upper lobe, right bronchus or lung: Secondary | ICD-10-CM | POA: Diagnosis not present

## 2017-02-06 DIAGNOSIS — J449 Chronic obstructive pulmonary disease, unspecified: Secondary | ICD-10-CM | POA: Diagnosis not present

## 2017-02-06 DIAGNOSIS — F17200 Nicotine dependence, unspecified, uncomplicated: Secondary | ICD-10-CM | POA: Diagnosis not present

## 2017-02-06 DIAGNOSIS — C3432 Malignant neoplasm of lower lobe, left bronchus or lung: Secondary | ICD-10-CM

## 2017-02-06 MED ORDER — TIOTROPIUM BROMIDE-OLODATEROL 2.5-2.5 MCG/ACT IN AERS
2.0000 | INHALATION_SPRAY | Freq: Every day | RESPIRATORY_TRACT | 0 refills | Status: DC
Start: 2017-02-06 — End: 2017-05-10

## 2017-02-06 NOTE — Assessment & Plan Note (Signed)
Despite ongoing smoking, previous lung cancer w/ lobectomy , lung function remains somewhat preserved .  Encouraged on smoking cessation   Plan  Cont on Stiolto .

## 2017-02-06 NOTE — Addendum Note (Signed)
Addended by: Tyson Dense on: 02/06/2017 10:48 AM   Modules accepted: Orders

## 2017-02-06 NOTE — Assessment & Plan Note (Signed)
Cont w/ follow up with serial CT chest

## 2017-02-06 NOTE — Progress Notes (Signed)
$'@Patient'v$  ID: [name], male    DOB: 18-Jul-1951, 66 y.o.   MRN: [id number]  Chief Complaint  Patient presents with  . Follow-up    COPD     Referring provider: [name], MD  HPI:66 year old male smoker followed for COPD and previous lung cancer in May 2013, status post lobectomy and LLL stage 1 squamous cell cancer in 2017 s/p XRT .   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 Mediastinal nodes Stable Oct 2009 - July 2011; no further fu  4.RUL pulmonary nodule with stage IA non-small cell lung cancer - May 2013  - - May 2013  - 1.9cm T1a, N0, M0  Stage 1A NSCLC with 0.3cm carcinoid - s/p lobectomy - December 2013 CT chest: No recurrence - July 2014 CT chest - no recurrence. RML nodule stabvle since Dec 2013 - JAn 2016 - no recurrence   02/06/2017 Follow up : COPD/Lung cancer hx /PFT  Patient presents for a one-month follow-up. She was seen last visit with a COPD exacerbation. He was treated with antibiotics and steroids. Patient is feeling better with decreased cough, congestion. Patient had a PFT on 01/31/2017 that showed an FEV1 at 87%, ratio 64, FVC 102%, DLCO 56%. This is improved since 2013 (59% in 2013 -pre lobectomy)  Using stiolto every other day.  Hx of lung cancer , Has upcoming CT chest in May .    Allergies  Allergen Reactions  . Actos [Pioglitazone] Other (See Comments)    EDEMA REACTION UNSPECIFIED  . Avandia [Rosiglitazone] Other (See Comments)    edema  . Dilaudid [Hydromorphone Hcl] Other (See Comments)    Made him crazy    Immunization History  Administered Date(s) Administered  . Influenza Split 08/21/2012, 08/25/2013  . Influenza Whole 06/06/2009, 07/07/2011  .  Influenza,inj,Quad PF,36+ Mos 10/25/2014, 08/17/2016  . Influenza-Unspecified 08/09/2010  . Pneumococcal Conjugate-13 10/02/2013  . Pneumococcal Polysaccharide-23 11/11/2008  . Tdap 09/09/2012  . Zoster 09/09/2012    Past Medical History:  Diagnosis Date  . Allergy   . Anemia   . Anginal pain ([place])   . Arthritis    "hands"  . Asthma   . Back pain    4 deteriorating disc and receives an injection q3-43mo;has been doing this for about 454yr . Blood transfusion    as a child  . Bronchitis   . Cancer ([facility]   Non-small cell lung cancer  . Chronic kidney disease    acute kidney failure post surgery  . COPD (chronic obstructive pulmonary disease) (HCC)    uses Albuterol and Spiriva daily  . Coronary artery disease    has 1 stent  . Depression    takes Prozac daily  . Emphysema    sees Dr.Ramaswami for this  . Family history of adverse reaction to anesthesia    mother was hard to wake up sometimes  . GERD (gastroesophageal reflux disease)    takes Prilosec daily  . Glaucoma    hx of  . H/O hiatal hernia   . Headache(784.0)   . Hypertension    takes  HYzaar daily  . Insomnia    takes Trazodone nightly  . Lung  mass    right upper lobe  . Myocardial infarct   . Obesity   . 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 Periodic limb movements of sleep   . Peripheral neuropathy ([place])   . Pneumonia    hx of' 66 yo,rd' last time in 2012  . Productive cough    white in color but no odor  . Shortness of breath    with exertion   . Sleep apnea    uses BiPaP; "no longer have sleep apnea since I have lost over 100 lbs"  . Syncope and collapse 05/26/2013  . Thyroid disease   . Type II diabetes mellitus (HCC)    takes Metformin bid and Novolog and Lantus daily    Tobacco History: History  Smoking Status  . Current Every Day Smoker  . Packs/day: 2.00  . Years: 45.00  . Types: Cigarettes  Smokeless Tobacco  . Never Used    Comment: Currently smoking 1 pack a day    Ready to quit:  No Counseling given: Yes   Outpatient Encounter Prescriptions as of 02/06/2017  Medication Sig  . acetaminophen (TYLENOL) 500 MG tablet Take 1,000 mg by mouth every 6 (six) hours as needed for moderate pain.  Marland Kitchen aspirin EC 81 MG tablet Take 81 mg by mouth daily.  Marland Kitchen atorvastatin (LIPITOR) 40 MG tablet Take 40 mg by mouth daily.  Marland Kitchen diltiazem (CARDIZEM CD) 120 MG 24 hr capsule Take 1 capsule (120 mg total) by mouth daily.  . diphenhydramine-acetaminophen (TYLENOL PM) 25-500 MG TABS tablet Take 12 tablets by mouth at bedtime as needed (sleep).  Marland Kitchen FLUoxetine (PROZAC) 40 MG capsule TAKE ONE CAPSULE BY MOUTH EVERY DAY  . gabapentin (NEURONTIN) 300 MG capsule TAKE 1 CAPSULE BY MOUTH THREE TIMES DAILY  . insulin aspart (NOVOLOG) 100 UNIT/ML injection Check blood glucose three (3) times daily before meals. CBG < 70: implement hypoglycemia protocol as needed CBG 70 -120: 0 UNITS CBG 121 - 150: 5 UNITS CBG 151 - 200: 10 UNITS CBG 201 - 250: 15 UNITS CBG 251 -300: 20 UNITS CBG 301 - 350: 25 UNITS CBG 351 - 400: 30 UNITS  . ipratropium-albuterol (DUONEB) 0.5-2.5 (3) MG/3ML SOLN USE 1 VIAL IN NEBULIZER 4 TIMES A DAY  . LANTUS 100 UNIT/ML injection Inject 15 Units into the skin every morning.   Marland Kitchen losartan (COZAAR) 25 MG tablet Take 25 mg by mouth daily.  . Multiple Vitamin (MULTIVITAMIN WITH MINERALS) TABS Take 1 tablet by mouth daily.  . nitroGLYCERIN (NITROSTAT) 0.4 MG SL tablet Place 0.4 mg under the tongue every 5 (five) minutes as needed for chest pain.  . ranitidine (ZANTAC) 150 MG capsule Take 1 capsule (150 mg total) by mouth 2 (two) times daily.  . Tiotropium Bromide-Olodaterol (STIOLTO RESPIMAT) 2.5-2.5 MCG/ACT AERS Inhale 2 puffs into the lungs daily.  Marland Kitchen tiZANidine (ZANAFLEX) 4 MG tablet Take 4 mg by mouth daily. May take an additional '4mg'$ s as needed for leg cramps  . topiramate (TOPAMAX) 50 MG tablet Take 1 tablet (50 mg total) by mouth 2 (two) times daily.  . traZODone (DESYREL) 100 MG tablet  TAKE ONE TABLET BY MOUTH EVERY NIGHT AT BEDTIME  . doxycycline (VIBRA-TABS) 100 MG tablet Take 1 tablet (100 mg total) by mouth 2 (two) times daily. (Patient not taking: Reported on 02/06/2017)  . predniSONE (DELTASONE) 10 MG tablet Take 4 tabs x 2 days, then 2 tabs x2 days, 1 tabs x 2 days and '5mg'$  x 2days. (Patient not taking:  Reported on 02/06/2017)   No facility-administered encounter medications on file as of 02/06/2017.      Review of Systems  Constitutional:   No  weight loss, night sweats,  Fevers, chills,  +fatigue, or  lassitude.  HEENT:   No headaches,  Difficulty swallowing,  Tooth/dental problems, or  Sore throat,                No sneezing, itching, ear ache, nasal congestion, post nasal drip,   CV:  No chest pain,  Orthopnea, PND, swelling in lower extremities, anasarca, dizziness, palpitations, syncope.   GI  No heartburn, indigestion, abdominal pain, nausea, vomiting, diarrhea, change in bowel habits, loss of appetite, bloody stools.   Resp:    No chest wall deformity  Skin: no rash or lesions.  GU: no dysuria, change in color of urine, no urgency or frequency.  No flank pain, no hematuria   MS:  No joint pain or swelling.  No decreased range of motion.  No back pain.    Physical Exam  BP (!) 108/56 (BP Location: Right Arm, Patient Position: Sitting, Cuff Size: Normal)   Pulse 72   Ht '5\' 11"'$  (1.803 m)   Wt 195 lb 12.8 oz (88.8 kg)   SpO2 99%   BMI 27.31 kg/m   GEN: A/Ox3; pleasant , NAD, elderly    HEENT:  [place]/AT,  EACs-clear, TMs-wnl, NOSE-clear, THROAT-clear, no lesions, no postnasal drip or exudate noted.   NECK:  Supple w/ fair ROM; no JVD; normal carotid impulses w/o bruits; no thyromegaly or nodules palpated; no lymphadenopathy.    RESP  Decreased BS in bases  no accessory muscle use, no dullness to percussion  CARD:  RRR, no m/r/g, no peripheral edema, pulses intact, no cyanosis or clubbing.  GI:   Soft & nt; nml bowel sounds; no organomegaly or  masses detected.   Musco: Warm bil, no deformities or joint swelling noted.   Neuro: alert, no focal deficits noted.    Skin: Warm, no lesions or rashes    Lab Results:  CBC   BNP No results found for: BNP  ProBNP Imaging: No results found.   Assessment & Plan:   COPD (chronic obstructive pulmonary disease) Despite ongoing smoking, previous lung cancer w/ lobectomy , lung function remains somewhat preserved .  Encouraged on smoking cessation   Plan  Cont on Stiolto .    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

## 2017-02-06 NOTE — Patient Instructions (Signed)
Continue on current regimen. Work on stopping smoking.  Follow up for CT chest as planned.  Follow up Dr. Chase Caller in 3-4 months and As needed    Please contact office for sooner follow up if symptoms do not improve or worsen or seek emergency care

## 2017-02-06 NOTE — Assessment & Plan Note (Signed)
Cont w/ follow up for serial CT chest in May as planned

## 2017-02-06 NOTE — Assessment & Plan Note (Signed)
smoknig cessation

## 2017-02-06 NOTE — Telephone Encounter (Signed)
Medication refilled per protocol. 

## 2017-02-12 DIAGNOSIS — Z794 Long term (current) use of insulin: Secondary | ICD-10-CM | POA: Diagnosis not present

## 2017-02-12 DIAGNOSIS — E1165 Type 2 diabetes mellitus with hyperglycemia: Secondary | ICD-10-CM | POA: Diagnosis not present

## 2017-02-12 DIAGNOSIS — Z720 Tobacco use: Secondary | ICD-10-CM | POA: Diagnosis not present

## 2017-02-12 DIAGNOSIS — E1142 Type 2 diabetes mellitus with diabetic polyneuropathy: Secondary | ICD-10-CM | POA: Diagnosis not present

## 2017-02-25 ENCOUNTER — Telehealth: Payer: Self-pay | Admitting: Internal Medicine

## 2017-02-25 DIAGNOSIS — J449 Chronic obstructive pulmonary disease, unspecified: Secondary | ICD-10-CM

## 2017-02-25 NOTE — Telephone Encounter (Signed)
lmtcb X1 for pt  

## 2017-02-26 NOTE — Telephone Encounter (Signed)
Called and spoke to pt's wife. Pt is needing neb supplies. Order placed. Pt's wife verbalized understanding and denied any further questions or concerns at this time.

## 2017-02-26 NOTE — Progress Notes (Signed)
Spoke with patient and informed him of results. Pt did not have any questions and verbalized understanding. Nothing further is needed.

## 2017-04-10 ENCOUNTER — Other Ambulatory Visit: Payer: Self-pay | Admitting: Internal Medicine

## 2017-04-10 DIAGNOSIS — J441 Chronic obstructive pulmonary disease with (acute) exacerbation: Secondary | ICD-10-CM

## 2017-04-12 ENCOUNTER — Ambulatory Visit (INDEPENDENT_AMBULATORY_CARE_PROVIDER_SITE_OTHER): Payer: PPO | Admitting: Podiatry

## 2017-04-12 ENCOUNTER — Encounter: Payer: Self-pay | Admitting: Podiatry

## 2017-04-12 DIAGNOSIS — M2010 Hallux valgus (acquired), unspecified foot: Secondary | ICD-10-CM

## 2017-04-12 DIAGNOSIS — M79676 Pain in unspecified toe(s): Secondary | ICD-10-CM

## 2017-04-12 DIAGNOSIS — B351 Tinea unguium: Secondary | ICD-10-CM | POA: Diagnosis not present

## 2017-04-12 DIAGNOSIS — E1151 Type 2 diabetes mellitus with diabetic peripheral angiopathy without gangrene: Secondary | ICD-10-CM | POA: Diagnosis not present

## 2017-04-12 NOTE — Progress Notes (Signed)
Subjective:     Patient ID: [name], male   DOB: April 19, 1951, 66 y.o.   MRN: [id number]  HPI this patient presents to the office with chief complaint of long thick painful nails. The nails have become thick and long and are painful walking and wearing his shoe.  He is diabetic.  He presents for preventive footcare services. Patient also presents to pick up his diabetic shoes.   Review of Systems     Objective:   Physical Exam GENERAL APPEARANCE: Alert, conversant. Appropriately groomed. No acute distress.  VASCULAR: Pedal pulses are diminished but  palpable at  [facility] and PT bilateral.  Capillary refill time is immediate to all digits,  Normal temperature gradient.  Digital hair growth is present bilateral  NEUROLOGIC: sensation is normal to 5.07 monofilament at 5/5 sites bilateral.  Light touch is intact bilateral, Muscle strength normal.  MUSCULOSKELETAL: acceptable muscle strength, tone and stability bilateral.  Intrinsic muscluature intact bilateral.  Rectus appearance of foot and digits noted bilateral. HAV  B/L.  Disdifured distal phalanx right left great toe.  DERMATOLOGIC: skin color, texture, and turgor are within normal limits.  No preulcerative lesions or ulcers  are seen, no interdigital maceration noted.  No open lesions present.   No drainage noted.  NAILS  Thick disfigured discolored nails both feet.        Assessment:     Onychomycosis  .  Diabetic neuropathy.  HAV    Plan:     Debride nails B/L   RTC 3 months for nail care. Dispense diabetic shoes.  Patient presents today and was dispensed 0ne pair ( two units) of medically necessary extra depth shoes with three pair( six units) of custom molded multiple density inserts. The shoes and the inserts are fitted to the patients ' feet and are noted to fit well and are free of defect.  Length and width of the shoes are also acceptable.  Patient was given written and verbal  instructions for wearing.  If any concerns arrive  with the shoes or inserts, the patient is to call the office.Patient is to follow up with doctor in six weeks.   Gardiner Barefoot DPM    Gardiner Barefoot DPM

## 2017-04-15 ENCOUNTER — Telehealth: Payer: Self-pay | Admitting: *Deleted

## 2017-04-15 NOTE — Telephone Encounter (Signed)
RETURNED PATIENT'S PHONE CALL, LVM FOR A RETURN CALL 

## 2017-04-29 NOTE — Progress Notes (Signed)
[name] 66 y.o man with Stage IA, T1aN0squamous cell carcinoma of the left lower lobe of lung radiation completed 09-24-16, review 07-02-18CT chest w contrast, 8 month FU.     Weight changes, if any: Wt Readings from Last 3 Encounters:  05/07/17 186 lb 3.2 oz (84.5 kg)  02/06/17 195 lb 12.8 oz (88.8 kg)  01/10/17 196 lb (88.9 kg)   Respiratory complaints, if any: SOB with exertion,coughing clear secretion,wheezing using inhaler Hemoptysis, if any: yes occassional Swallowing Problems/Pain/Difficulty swallowing:Yes while eating and drinking having swallowing problems, food does not want to go down easily.  Reports he is staying hoarse had a recent head cold. Appetite :Has a good appetite. Pain:Reports pain in back,chest and feet 8/10 taking Neurontin no pain medication. When is next chemo scheduled?: N/A Imaging:05-06-17 CT chest w contrast Lab work from of chart:05-06-17 Bmet BP 133/66   Pulse 71   Temp 97.7 F (36.5 C) (Oral)   Resp 18   Ht 5\' 11"  (1.803 m)   Wt 186 lb 3.2 oz (84.5 kg)   SpO2 99%   BMI 25.97 kg/m

## 2017-05-05 ENCOUNTER — Other Ambulatory Visit: Payer: Self-pay | Admitting: Family Medicine

## 2017-05-06 ENCOUNTER — Encounter (HOSPITAL_COMMUNITY): Payer: Self-pay

## 2017-05-06 ENCOUNTER — Ambulatory Visit (HOSPITAL_COMMUNITY)
Admission: RE | Admit: 2017-05-06 | Discharge: 2017-05-06 | Disposition: A | Discharge status: Home / Self Care | Payer: PPO | Source: Ambulatory Visit | Attending: Radiation Oncology | Admitting: Radiation Oncology

## 2017-05-06 ENCOUNTER — Ambulatory Visit
Admission: RE | Admit: 2017-05-06 | Discharge: 2017-05-06 | Disposition: A | Discharge status: Home / Self Care | Payer: PPO | Source: Ambulatory Visit | Attending: Radiation Oncology | Admitting: Radiation Oncology

## 2017-05-06 DIAGNOSIS — Z902 Acquired absence of lung [part of]: Secondary | ICD-10-CM | POA: Insufficient documentation

## 2017-05-06 DIAGNOSIS — R590 Localized enlarged lymph nodes: Secondary | ICD-10-CM | POA: Insufficient documentation

## 2017-05-06 DIAGNOSIS — J439 Emphysema, unspecified: Secondary | ICD-10-CM | POA: Diagnosis not present

## 2017-05-06 DIAGNOSIS — N189 Chronic kidney disease, unspecified: Secondary | ICD-10-CM | POA: Insufficient documentation

## 2017-05-06 DIAGNOSIS — I2510 Atherosclerotic heart disease of native coronary artery without angina pectoris: Secondary | ICD-10-CM | POA: Diagnosis not present

## 2017-05-06 DIAGNOSIS — J449 Chronic obstructive pulmonary disease, unspecified: Secondary | ICD-10-CM | POA: Insufficient documentation

## 2017-05-06 DIAGNOSIS — E1142 Type 2 diabetes mellitus with diabetic polyneuropathy: Secondary | ICD-10-CM | POA: Insufficient documentation

## 2017-05-06 DIAGNOSIS — I700 Atherosclerosis of aorta: Secondary | ICD-10-CM | POA: Insufficient documentation

## 2017-05-06 DIAGNOSIS — C3411 Malignant neoplasm of upper lobe, right bronchus or lung: Secondary | ICD-10-CM | POA: Insufficient documentation

## 2017-05-06 DIAGNOSIS — R918 Other nonspecific abnormal finding of lung field: Secondary | ICD-10-CM | POA: Insufficient documentation

## 2017-05-06 DIAGNOSIS — F17210 Nicotine dependence, cigarettes, uncomplicated: Secondary | ICD-10-CM | POA: Insufficient documentation

## 2017-05-06 DIAGNOSIS — Z79899 Other long term (current) drug therapy: Secondary | ICD-10-CM | POA: Insufficient documentation

## 2017-05-06 DIAGNOSIS — E1122 Type 2 diabetes mellitus with diabetic chronic kidney disease: Secondary | ICD-10-CM | POA: Insufficient documentation

## 2017-05-06 DIAGNOSIS — Z7982 Long term (current) use of aspirin: Secondary | ICD-10-CM | POA: Insufficient documentation

## 2017-05-06 DIAGNOSIS — F329 Major depressive disorder, single episode, unspecified: Secondary | ICD-10-CM | POA: Insufficient documentation

## 2017-05-06 DIAGNOSIS — E079 Disorder of thyroid, unspecified: Secondary | ICD-10-CM | POA: Insufficient documentation

## 2017-05-06 DIAGNOSIS — I252 Old myocardial infarction: Secondary | ICD-10-CM | POA: Insufficient documentation

## 2017-05-06 DIAGNOSIS — K219 Gastro-esophageal reflux disease without esophagitis: Secondary | ICD-10-CM | POA: Insufficient documentation

## 2017-05-06 DIAGNOSIS — I129 Hypertensive chronic kidney disease with stage 1 through stage 4 chronic kidney disease, or unspecified chronic kidney disease: Secondary | ICD-10-CM | POA: Insufficient documentation

## 2017-05-06 DIAGNOSIS — R05 Cough: Secondary | ICD-10-CM | POA: Insufficient documentation

## 2017-05-06 DIAGNOSIS — M545 Low back pain: Secondary | ICD-10-CM | POA: Insufficient documentation

## 2017-05-06 LAB — BASIC METABOLIC PANEL
ANION GAP: 9 meq/L (ref 3–11)
BUN: 25.1 mg/dL (ref 7.0–26.0)
CO2: 23 meq/L (ref 22–29)
Calcium: 9.2 mg/dL (ref 8.4–10.4)
Chloride: 109 mEq/L (ref 98–109)
Creatinine: 1.3 mg/dL (ref 0.7–1.3)
EGFR: 55 mL/min/{1.73_m2} — ABNORMAL LOW (ref >90)
GLUCOSE: 90 mg/dL (ref 70–140)
POTASSIUM: 4.6 meq/L (ref 3.5–5.1)
Sodium: 141 mEq/L (ref 136–145)

## 2017-05-06 MED ORDER — IOPAMIDOL (ISOVUE-300) INJECTION 61%
INTRAVENOUS | Status: AC
  Filled 2017-05-06: qty 75

## 2017-05-06 MED ORDER — IOPAMIDOL (ISOVUE-300) INJECTION 61%
75.0000 mL | Freq: Once | INTRAVENOUS | Status: AC | PRN
  Administered 2017-05-06: 75 mL via INTRAVENOUS

## 2017-05-07 ENCOUNTER — Telehealth: Payer: Self-pay

## 2017-05-07 ENCOUNTER — Ambulatory Visit
Admission: RE | Admit: 2017-05-07 | Discharge: 2017-05-07 | Disposition: A | Discharge status: Home / Self Care | Payer: PPO | Source: Ambulatory Visit | Attending: Radiation Oncology | Admitting: Radiation Oncology

## 2017-05-07 ENCOUNTER — Encounter: Payer: Self-pay | Admitting: Radiation Oncology

## 2017-05-07 VITALS — BP 133/66 | HR 71 | Temp 97.7°F | Resp 18 | Ht 71.0 in | Wt 186.2 lb

## 2017-05-07 DIAGNOSIS — C3411 Malignant neoplasm of upper lobe, right bronchus or lung: Secondary | ICD-10-CM | POA: Diagnosis not present

## 2017-05-07 DIAGNOSIS — F329 Major depressive disorder, single episode, unspecified: Secondary | ICD-10-CM | POA: Diagnosis not present

## 2017-05-07 DIAGNOSIS — Z85118 Personal history of other malignant neoplasm of bronchus and lung: Secondary | ICD-10-CM | POA: Diagnosis not present

## 2017-05-07 DIAGNOSIS — F17210 Nicotine dependence, cigarettes, uncomplicated: Secondary | ICD-10-CM | POA: Diagnosis not present

## 2017-05-07 DIAGNOSIS — C3432 Malignant neoplasm of lower lobe, left bronchus or lung: Secondary | ICD-10-CM

## 2017-05-07 DIAGNOSIS — I2510 Atherosclerotic heart disease of native coronary artery without angina pectoris: Secondary | ICD-10-CM | POA: Diagnosis not present

## 2017-05-07 DIAGNOSIS — M545 Low back pain: Secondary | ICD-10-CM | POA: Diagnosis not present

## 2017-05-07 DIAGNOSIS — E1142 Type 2 diabetes mellitus with diabetic polyneuropathy: Secondary | ICD-10-CM | POA: Diagnosis not present

## 2017-05-07 DIAGNOSIS — R590 Localized enlarged lymph nodes: Secondary | ICD-10-CM | POA: Diagnosis not present

## 2017-05-07 DIAGNOSIS — E079 Disorder of thyroid, unspecified: Secondary | ICD-10-CM | POA: Diagnosis not present

## 2017-05-07 DIAGNOSIS — R05 Cough: Secondary | ICD-10-CM | POA: Diagnosis not present

## 2017-05-07 DIAGNOSIS — J449 Chronic obstructive pulmonary disease, unspecified: Secondary | ICD-10-CM | POA: Diagnosis not present

## 2017-05-07 DIAGNOSIS — Z7982 Long term (current) use of aspirin: Secondary | ICD-10-CM | POA: Diagnosis not present

## 2017-05-07 DIAGNOSIS — I129 Hypertensive chronic kidney disease with stage 1 through stage 4 chronic kidney disease, or unspecified chronic kidney disease: Secondary | ICD-10-CM | POA: Diagnosis not present

## 2017-05-07 DIAGNOSIS — I252 Old myocardial infarction: Secondary | ICD-10-CM | POA: Diagnosis not present

## 2017-05-07 DIAGNOSIS — N189 Chronic kidney disease, unspecified: Secondary | ICD-10-CM | POA: Diagnosis not present

## 2017-05-07 DIAGNOSIS — K219 Gastro-esophageal reflux disease without esophagitis: Secondary | ICD-10-CM | POA: Diagnosis not present

## 2017-05-07 DIAGNOSIS — E1122 Type 2 diabetes mellitus with diabetic chronic kidney disease: Secondary | ICD-10-CM | POA: Diagnosis not present

## 2017-05-07 DIAGNOSIS — Z79899 Other long term (current) drug therapy: Secondary | ICD-10-CM | POA: Diagnosis not present

## 2017-05-07 DIAGNOSIS — Z923 Personal history of irradiation: Secondary | ICD-10-CM | POA: Diagnosis not present

## 2017-05-07 MED ORDER — LEVOFLOXACIN 750 MG PO TABS: 750.0000 mg | ORAL_TABLET | Freq: Every day | ORAL | 0 refills | Status: DC

## 2017-05-07 NOTE — Telephone Encounter (Signed)
[name] from Dr. Johny Shears office called and states pt is losing weight unintentionally and they are trying to figure out if it is due to the cancer or something else. Patient is in the early stage of lung cancer and has lost 20 lbs   [name] was calling to see if you felt the patient may need more thyroid testing to see where his levels are? Pls advise

## 2017-05-08 NOTE — Progress Notes (Addendum)
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 He also has a history of Stage I, NSCLC, squamous cell of the right upper lobe, which was resected in 2013 with Dr. [name]. He was followed in surveillance which is when his most recent cancer was identified. He has been followed in surveillance since SBRT. He has had several mediastinal nodes that have been stable at 9-10 mm. His last CT on 05/06/17 revealed no new concerning lung lesions, and post radiotherapy change in the LLL. In the mediastinum, he has persistent adenopathy which could be reactive, however in personal review there is suspicion that there may be a slight increase in the size of these.                         On review of systems, the patient states he is not feeling as well as he had been at his last visit. Although he had plans to begin an exercise regimen and to possibly cut back on tobacco use, he has not been doing either. He is smoking up to 2 packs of cigarettes per day. He has lost about 20 pounds in the last 6 months unintentionally. He also describes feeling cold frequently. He denies fevers or chills. He continues to cough and has more frequent episodes of productive mucous, and denies hemoptysis. He denies shortness of breath at rest but does tire quickly and is winded with mild exertion. He has also noticed increased low back pain which has not  been problematic in quite some time. He does have a history of arthritis and has required steroid injections previously with Dr. Nelva Bush. His back pain is in the lower back and has been progressively getting worse in the last 3-4 months. He denies any radiculopathy, neuropathic symptoms, or loss of bowel or bladder control.  A complete review of systems is obtained and is otherwise negative.  Past Medical History:  Past Medical History:  Diagnosis Date  . Allergy   . Anemia   . Anginal pain ([place])   . Arthritis    "hands"  . Asthma   . Back pain    4 deteriorating disc and receives an injection q3-56mon;has been doing this for about 61yrs  . Blood transfusion    as a child  . Bronchitis   . Cancer ([place]) dx'd 2013   Non-small cell lung cancer  . Chronic kidney disease    acute kidney failure post surgery  . COPD (chronic obstructive pulmonary disease) (HCC)    uses Albuterol and Spiriva daily  . Coronary artery disease    has 1 stent  . Depression    takes Prozac daily  . Emphysema    sees Dr.Ramaswami for this  . Family history of adverse reaction to anesthesia    mother was hard to wake up sometimes  .  GERD (gastroesophageal reflux disease)    takes Prilosec daily  . Glaucoma    hx of  . H/O hiatal hernia   . Headache(784.0)   . Hypertension    takes  HYzaar daily  . Insomnia    takes Trazodone nightly  . Lung mass    right upper lobe  . Myocardial infarct ([place])   . Obesity   . 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 Periodic limb movements of sleep   . Peripheral neuropathy   . Pneumonia    hx of' 66 yo,rd' last time in 2012  . Productive cough    white in color but no odor  . Shortness of breath    with exertion   . Sleep apnea    uses BiPaP; "no longer have sleep apnea since I have lost over 100 lbs"  . Syncope and collapse 05/26/2013  . Thyroid disease   . Type II diabetes mellitus (HCC)    takes Metformin bid and Novolog and Lantus daily    Past Surgical  History: Past Surgical History:  Procedure Laterality Date  . CARDIAC CATHETERIZATION  2006/2008/2009  . CARPAL TUNNEL RELEASE     bilateral  . COLONOSCOPY    . CORONARY ANGIOPLASTY WITH STENT PLACEMENT     1 stent  . ELBOW SURGERY     ulnar nerve; left  . ESOPHAGOGASTRODUODENOSCOPY    . EYE SURGERY     pt. denies  . FUDUCIAL PLACEMENT Left 08/24/2016   Procedure: PLACEMENT OF FUDUCIAL;  Surgeon: Melrose Nakayama, MD;  Location: [facility];  Service: Thoracic;  Laterality: Left;  . INGUINAL HERNIA REPAIR  1990's   double inguinal  . LOBECTOMY  03/17/2012   upper right side with resection  . REFRACTIVE SURGERY     bilaterally  . ROTATOR CUFF REPAIR     left  . VIDEO BRONCHOSCOPY WITH ENDOBRONCHIAL NAVIGATION N/A 08/24/2016   Procedure: VIDEO BRONCHOSCOPY WITH ENDOBRONCHIAL NAVIGATION;  Surgeon: Melrose Nakayama, MD;  Location: [facility];  Service: Thoracic;  Laterality: N/A;    Social History:  Social History   Social History  . Marital status: Married    Spouse name: mary lou  . Number of children: 3  . Years of education: trade    Occupational History  . truck driver     retired   Social History Main Topics  . Smoking status: Current Every Day Smoker    Packs/day: 2.00    Years: 45.00    Types: Cigarettes  . Smokeless tobacco: Never Used     Comment: Currently smoking 1 pack a day   . Alcohol use Yes     Comment: gallon per week liquor "Haven't drink in 10-12 years"  . Drug use: Yes    Types: Cocaine     Comment: quit 1990's  . Sexual activity: Not Currently   Other Topics Concern  . 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 COPD Mother   . Diabetes Mother   . Hypertension Mother   . Heart attack Father   . Diabetes Father   .  Anesthesia problems Neg Hx   . Hypotension Neg Hx   .  Malignant hyperthermia Neg Hx   . Pseudochol deficiency Neg Hx      ALLERGIES:  is allergic to actos [pioglitazone]; avandia [rosiglitazone]; and dilaudid [hydromorphone hcl].  Meds: Current Outpatient Prescriptions  Medication Sig Dispense Refill  . aspirin EC 81 MG tablet Take 81 mg by mouth daily.    Marland Kitchen atorvastatin (LIPITOR) 40 MG tablet Take 40 mg by mouth daily.    Marland Kitchen diltiazem (CARDIZEM CD) 120 MG 24 hr capsule Take 1 capsule (120 mg total) by mouth daily. 90 capsule 3  . diphenhydramine-acetaminophen (TYLENOL PM) 25-500 MG TABS tablet Take 12 tablets by mouth at bedtime as needed (sleep).    Marland Kitchen FLUoxetine (PROZAC) 40 MG capsule TAKE ONE CAPSULE BY MOUTH EVERY DAY 90 capsule 1  . gabapentin (NEURONTIN) 300 MG capsule TAKE 1 CAPSULE BY MOUTH THREE TIMES DAILY 270 capsule 1  . ipratropium-albuterol (DUONEB) 0.5-2.5 (3) MG/3ML SOLN USE 1 VIAL IN NEBULIZER 4 TIMES A DAY 360 mL 5  . LANTUS 100 UNIT/ML injection Inject 12 Units into the skin every morning.   3  . losartan (COZAAR) 25 MG tablet Take 25 mg by mouth daily.    . Multiple Vitamin (MULTIVITAMIN WITH MINERALS) TABS Take 1 tablet by mouth daily.    . ranitidine (ZANTAC) 150 MG capsule Take 1 capsule (150 mg total) by mouth 2 (two) times daily. 60 capsule 11  . topiramate (TOPAMAX) 50 MG tablet Take 1 tablet (50 mg total) by mouth 2 (two) times daily. 360 tablet 0  . traZODone (DESYREL) 100 MG tablet TAKE ONE TABLET BY MOUTH EVERY NIGHT AT BEDTIME 90 tablet 1  . acetaminophen (TYLENOL) 500 MG tablet Take 1,000 mg by mouth every 6 (six) hours as needed for moderate pain.    Marland Kitchen levofloxacin (LEVAQUIN) 750 MG tablet Take 1 tablet (750 mg total) by mouth daily. 10 tablet 0  . nitroGLYCERIN (NITROSTAT) 0.4 MG SL tablet Place 0.4 mg under the tongue every 5 (five) minutes as needed for chest pain.    . predniSONE (DELTASONE) 10 MG tablet Take 4 tabs x 2 days, then 2 tabs x2 days, 1 tabs  x 2 days and 5mg  x 2days. (Patient not taking: Reported on 02/06/2017) 13 tablet 0  . Tiotropium Bromide-Olodaterol (STIOLTO RESPIMAT) 2.5-2.5 MCG/ACT AERS Inhale 2 puffs into the lungs daily. (Patient not taking: Reported on 05/07/2017) 1 Inhaler 0  . tiZANidine (ZANAFLEX) 4 MG tablet Take 4 mg by mouth daily. May take an additional 4mg s as needed for leg cramps     No current facility-administered medications for this encounter.     Physical Findings:  height is 5\' 11"  (1.803 m) and weight is 186 lb 3.2 oz (84.5 kg). His oral temperature is 97.7 F (36.5 C). His blood pressure is 133/66 and his pulse is 71. His respiration is 18 and oxygen saturation is 99%.  In general this is a tired appearing caucasian male in no acute distress. He is alert and oriented x4 and appropriate throughout the examination. HEENT reveals that the patient is normocephalic, atraumatic. EOMs are intact. PERRLA. Skin is intact without any evidence of gross lesions. Cardiovascular exam reveals a regular rate and rhythm, no clicks rubs or murmurs are auscultated. Chest is clear to auscultation in the left fields but reveals coarse breath sounds at the right base. Lymphatic assessment is performed and does not reveal any adenopathy in the cervical, supraclavicular, axillary, or inguinal chains. Abdomen has active bowel sounds in all quadrants and is intact. The abdomen  is soft, non tender, non distended. Lower extremities are negative for pretibial pitting edema, deep calf tenderness, cyanosis or clubbing.  Lab Findings: Lab Results  Component Value Date   WBC 14.8 (H) 08/23/2016   HGB 13.9 08/23/2016   HCT 40.4 08/23/2016   MCV 91.2 08/23/2016   PLT 221 08/23/2016     Radiographic Findings: Ct Chest W Contrast  Result Date: 05/06/2017 CLINICAL DATA:  Non-small-cell lung cancer with radiation therapy completed January 2008. Right upper lobectomy with mediastinal lymph node dissection. Cough. Shortness of breath. EXAM: CT  CHEST WITH CONTRAST TECHNIQUE: Multidetector CT imaging of the chest was performed during intravenous contrast administration. CONTRAST:  38mL ISOVUE-300 IOPAMIDOL (ISOVUE-300) INJECTION 61% COMPARISON:  11/02/2016 FINDINGS: Cardiovascular: Aortic and branch vessel atherosclerosis. Ulcerative plaque involves the descending thoracic aorta. Normal heart size, without pericardial effusion. Multivessel coronary artery atherosclerosis. No central pulmonary embolism, on this non-dedicated study. Mediastinum/Nodes: No supraclavicular adenopathy. Right paratracheal node measures 2.3 x 1.0 cm and is similar on image 47/series 2. AP window node measures 1.6 x 1.0 cm on image 53/series 2. This is not significantly changed. Upper normal bilateral hilar nodal tissue is chronic. Lungs/Pleura: Minimal left-sided pleural thickening. Right upper lobectomy. Moderate centrilobular and paraseptal emphysema. 3 mm right lower lobe pulmonary nodule is unchanged on image 82/series 7. Superior segment left lower lobe presumed radiation induced scarring, including on image 75/ series 7. This is in less nodular than on the prior exam. Adjacent calcified and noncalcified pulmonary nodules measure maximally 2 mm including on image 85/series 7 and are unchanged.Subtle areas of mid and lower lung ground-glass are similar and nonspecific. Surgical or radiation clip in the posterior lingula. Upper Abdomen: Normal imaged portions of the liver, spleen, adrenal glands. Low-density bilateral renal lesions are likely cysts. The greater curvature of the stomach appears thick walled, possibly due to underdistention. Example image 147/series 2. Normal imaged gallbladder. Musculoskeletal: Mild bilateral gynecomastia. Probable postsurgical right sixth rib defect. IMPRESSION: 1. Status post right upper lobectomy, without recurrent disease. 2. Similar mild thoracic adenopathy, favoring a reactive etiology. 3. Coronary artery atherosclerosis. Aortic  Atherosclerosis (ICD10-I70.0). 4. Apparent greater curvature gastric wall thickening could be due to underdistention. Correlate with any symptoms to suggest gastric pathology. 5. Decrease nodularity within the area of presumed radiation change in the superior segment left lower lobe. 6. Emphysema (ICD10-J43.9). Nonspecific lower lobe predominant ground-glass is similar. Electronically Signed   By: Abigail Miyamoto M.D.   On: 05/06/2017 13:47    Impression/Plan: 1.  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 I've discussed the findings of the CT scan with the patient. With his symptoms, his adenopathy could be due to reactive change. We will proceed with a course of antibiotics, and follow that with PET imaging to ensure he does not have progressive changes. He understands that I will contact him by phone after the PET has been performed and reviewed in thoracic oncology conference. He is in agreement with this plan.  2. Stage IA, T1aN0squamous cell carcinoma of the right upper lobe of lung. The patient appears to be radiographically without disease in the right lung fields.  3. COPD. The patient will continue to follow up with pulmonology and may need to be sooner if his PET is negative and if he remains symptomatic despite antibiotics. 4. Tobacco cessation. We reviewed strategies to become sucessful at cessation. He will call with questions or concerns. 5. Low back pain. Although this is likely due to arthritic changes,  given his history we will evaluate this as well on PET. He will contact Dr. Nelva Bush as well and we will reach out to Dr. Nelva Bush to see if we can expedite an evaluation.  6. Cold intolerance and weight loss. Again given his history we will rule out recurrent disease by PET imaging, but I've also reached out to Dr. Dennard Schaumann his PCP to further evaluate his TSH, it has been since 2014 that this was checked. We appreciate his recommendations regarding this.   In a visit  lasting 45 minutes, greater than 50% of the time was spent face to face discussing his symptoms, and coordinating the patient's care.     Carola Rhine, PAC

## 2017-05-09 NOTE — Telephone Encounter (Signed)
Spoke with patient wife Stasia Cavalier she scheduled  the patient an appointment for 05/10/2017 @815am 

## 2017-05-09 NOTE — Telephone Encounter (Signed)
I need to see the patient to discuss.

## 2017-05-10 ENCOUNTER — Encounter: Payer: Self-pay | Admitting: Family Medicine

## 2017-05-10 ENCOUNTER — Ambulatory Visit (INDEPENDENT_AMBULATORY_CARE_PROVIDER_SITE_OTHER): Payer: PPO | Admitting: Family Medicine

## 2017-05-10 VITALS — BP 142/86 | HR 94 | Temp 97.8°F | Resp 18 | Ht 71.0 in | Wt 183.0 lb

## 2017-05-10 DIAGNOSIS — R490 Dysphonia: Secondary | ICD-10-CM

## 2017-05-10 DIAGNOSIS — R634 Abnormal weight loss: Secondary | ICD-10-CM | POA: Diagnosis not present

## 2017-05-10 LAB — CBC WITH DIFFERENTIAL/PLATELET
Basophils Absolute: 0 cells/uL (ref 0–200)
Basophils Relative: 0 %
Eosinophils Absolute: 388 cells/uL (ref 15–500)
Eosinophils Relative: 4 %
HEMATOCRIT: 41.6 % (ref 38.5–50.0)
HEMOGLOBIN: 13.6 g/dL (ref 13.0–17.0)
LYMPHS ABS: 1552 {cells}/uL (ref 850–3900)
Lymphocytes Relative: 16 %
MCH: 32 pg (ref 27.0–33.0)
MCHC: 32.7 g/dL (ref 32.0–36.0)
MCV: 97.9 fL (ref 80.0–100.0)
MONO ABS: 873 {cells}/uL (ref 200–950)
MPV: 10.3 fL (ref 7.5–12.5)
Monocytes Relative: 9 %
NEUTROS PCT: 71 %
Neutro Abs: 6887 cells/uL (ref 1500–7800)
Platelets: 224 10*3/uL (ref 140–400)
RBC: 4.25 MIL/uL (ref 4.20–5.80)
RDW: 14.2 % (ref 11.0–15.0)
WBC: 9.7 10*3/uL (ref 3.8–10.8)

## 2017-05-10 LAB — TSH: TSH: 3.54 m[IU]/L (ref 0.40–4.50)

## 2017-05-10 LAB — COMPLETE METABOLIC PANEL WITH GFR
ALBUMIN: 4.3 g/dL (ref 3.6–5.1)
ALK PHOS: 97 U/L (ref 40–115)
ALT: 11 U/L (ref 9–46)
AST: 12 U/L (ref 10–35)
BILIRUBIN TOTAL: 0.4 mg/dL (ref 0.2–1.2)
BUN: 31 mg/dL — AB (ref 7–25)
CALCIUM: 9.2 mg/dL (ref 8.6–10.3)
CO2: 21 mmol/L (ref 20–31)
CREATININE: 1.57 mg/dL — AB (ref 0.70–1.25)
Chloride: 107 mmol/L (ref 98–110)
GFR, Est African American: 52 mL/min — ABNORMAL LOW (ref >=60)
GFR, Est Non African American: 45 mL/min — ABNORMAL LOW (ref >=60)
Glucose, Bld: 108 mg/dL — ABNORMAL HIGH (ref 70–99)
POTASSIUM: 5.2 mmol/L (ref 3.5–5.3)
Sodium: 137 mmol/L (ref 135–146)
Total Protein: 6.6 g/dL (ref 6.1–8.1)

## 2017-05-10 LAB — LIPID PANEL
Cholesterol: 137 mg/dL (ref <200)
HDL: 35 mg/dL — ABNORMAL LOW (ref >40)
LDL Cholesterol: 82 mg/dL (ref <100)
Total CHOL/HDL Ratio: 3.9 Ratio (ref <5.0)
Triglycerides: 99 mg/dL (ref <150)
VLDL: 20 mg/dL (ref <30)

## 2017-05-10 NOTE — Progress Notes (Signed)
Subjective:    Patient ID: [name], male    DOB: 1951-05-16, 66 y.o.   MRN: [id number]  HPI I have not seen the patient in quite some time. However he states that over the last year or more he has lost close to 100 pounds. Over the last few months, he has lost upwards of 20 pounds. He has a history of lung cancer status post resection of the right upper lobe. Recent CAT scan of abdomen and chest revealed paratracheal lymph nodes that are roughly the same size but no new concerning findings. His oncologist has planned a PET scan to evaluate for malignancy as a possible cause of his weight loss. Of note, the patient is also on Topamax which can cause weight loss for seizure prophylaxis. He denies eating any special diet. He is certainly not exercising. Most recent CAT scan did show thickening of the wall of the greater curvature of the stomach. It has been several years since he had an EGD. He denies any abdominal pain nausea or vomiting or melena. However his wife reports that he has been hoarse now for several months and is gradually getting worse. He denies any dysphasia, globus sensation, hemoptysis, or stridor Past Medical History:  Diagnosis Date  . Allergy   . Anemia   . Anginal pain ([place])   . Arthritis    "hands"  . Asthma   . Back pain    4 deteriorating disc and receives an injection q3-34mon;has been doing this for about 43yrs  . Blood transfusion    as a child  . Bronchitis   . Cancer ([place]) dx'd 2013   Non-small cell lung cancer  . Chronic kidney disease    acute kidney failure post surgery  . COPD (chronic obstructive pulmonary disease) ([facility])    uses Albuterol and Spiriva daily  . Coronary artery disease    has 1 stent  . Depression    takes Prozac daily  . Emphysema    sees Dr.Ramaswami for this  . Family history of adverse reaction to anesthesia    mother was hard to wake up sometimes  . GERD (gastroesophageal reflux disease)    takes Prilosec daily  .  Glaucoma    hx of  . H/O hiatal hernia   . Headache(784.0)   . Hypertension    takes  HYzaar daily  . Insomnia    takes Trazodone nightly  . Lung mass    right upper lobe  . Myocardial infarct ([place])   . Obesity   . 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 Periodic limb movements of sleep   . Peripheral neuropathy   . Pneumonia    hx of' 66 yo,rd' last time in 2012  . Productive cough    white in color but no odor  . Shortness of breath    with exertion   . Sleep apnea    uses BiPaP; "no longer have sleep apnea since I have lost over 100 lbs"  . Syncope and collapse 05/26/2013  . Thyroid disease   . Type II diabetes mellitus ([facility])    takes Metformin bid and Novolog and Lantus daily   Past Surgical History:  Procedure Laterality Date  . CARDIAC CATHETERIZATION  2006/2008/2009  . CARPAL TUNNEL RELEASE     bilateral  . COLONOSCOPY    . CORONARY ANGIOPLASTY WITH STENT PLACEMENT     1 stent  . ELBOW SURGERY     ulnar nerve; left  . ESOPHAGOGASTRODUODENOSCOPY    .  EYE SURGERY     pt. denies  . FUDUCIAL PLACEMENT Left 08/24/2016   Procedure: PLACEMENT OF FUDUCIAL;  Surgeon: Melrose Nakayama, MD;  Location: [facility];  Service: Thoracic;  Laterality: Left;  . INGUINAL HERNIA REPAIR  1990's   double inguinal  . LOBECTOMY  03/17/2012   upper right side with resection  . REFRACTIVE SURGERY     bilaterally  . ROTATOR CUFF REPAIR     left  . VIDEO BRONCHOSCOPY WITH ENDOBRONCHIAL NAVIGATION N/A 08/24/2016   Procedure: VIDEO BRONCHOSCOPY WITH ENDOBRONCHIAL NAVIGATION;  Surgeon: Melrose Nakayama, MD;  Location: [facility];  Service: Thoracic;  Laterality: N/A;   Current Outpatient Prescriptions on File Prior to Visit  Medication Sig Dispense Refill  . acetaminophen (TYLENOL) 500 MG tablet Take 1,000 mg by mouth every 6 (six) hours as needed for moderate pain.    Marland Kitchen aspirin EC 81 MG tablet Take 81 mg by mouth daily.    Marland Kitchen atorvastatin (LIPITOR) 40 MG tablet Take 40 mg by mouth daily.    Marland Kitchen  diltiazem (CARDIZEM CD) 120 MG 24 hr capsule Take 1 capsule (120 mg total) by mouth daily. 90 capsule 3  . diphenhydramine-acetaminophen (TYLENOL PM) 25-500 MG TABS tablet Take 12 tablets by mouth at bedtime as needed (sleep).    Marland Kitchen FLUoxetine (PROZAC) 40 MG capsule TAKE ONE CAPSULE BY MOUTH EVERY DAY 90 capsule 1  . gabapentin (NEURONTIN) 300 MG capsule TAKE 1 CAPSULE BY MOUTH THREE TIMES DAILY 270 capsule 1  . ipratropium-albuterol (DUONEB) 0.5-2.5 (3) MG/3ML SOLN USE 1 VIAL IN NEBULIZER 4 TIMES A DAY 360 mL 5  . LANTUS 100 UNIT/ML injection Inject 12 Units into the skin every morning.   3  . levofloxacin (LEVAQUIN) 750 MG tablet Take 1 tablet (750 mg total) by mouth daily. 10 tablet 0  . losartan (COZAAR) 25 MG tablet Take 25 mg by mouth daily.    . Multiple Vitamin (MULTIVITAMIN WITH MINERALS) TABS Take 1 tablet by mouth daily.    . nitroGLYCERIN (NITROSTAT) 0.4 MG SL tablet Place 0.4 mg under the tongue every 5 (five) minutes as needed for chest pain.    . ranitidine (ZANTAC) 150 MG capsule Take 1 capsule (150 mg total) by mouth 2 (two) times daily. 60 capsule 11  . tiZANidine (ZANAFLEX) 4 MG tablet Take 4 mg by mouth daily. May take an additional 4mg s as needed for leg cramps    . topiramate (TOPAMAX) 50 MG tablet Take 1 tablet (50 mg total) by mouth 2 (two) times daily. 360 tablet 0  . traZODone (DESYREL) 100 MG tablet TAKE ONE TABLET BY MOUTH EVERY NIGHT AT BEDTIME 90 tablet 1   No current facility-administered medications on file prior to visit.    Allergies  Allergen Reactions  . Actos [Pioglitazone] Other (See Comments)    EDEMA REACTION UNSPECIFIED  . Avandia [Rosiglitazone] Other (See Comments)    edema  . Dilaudid [Hydromorphone Hcl] Other (See Comments)    Made him crazy   Social History   Social History  . Marital status: Married    Spouse name: [name]  . Number of children: 3  . Years of education: trade    Occupational History  . truck driver     retired    Social History Main Topics  . Smoking status: Current Every Day Smoker    Packs/day: 2.00    Years: 45.00    Types: Cigarettes  . Smokeless tobacco: Never Used  Comment: Currently smoking 1 pack a day   . Alcohol use Yes     Comment: gallon per week liquor "Haven't drink in 10-12 years"  . Drug use: Yes    Types: Cocaine     Comment: quit 1990's  . Sexual activity: Not Currently   Other Topics Concern  . 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      Objective:   Physical Exam  Constitutional: He appears well-developed and well-nourished.  HENT:  Mouth/Throat: Oropharynx is clear and moist.  Neck: Neck supple. No thyromegaly present.  Cardiovascular: Normal rate, regular rhythm and normal heart sounds.   Pulmonary/Chest: Effort normal and breath sounds normal. No respiratory distress. He has no wheezes. He has no rales.  Abdominal: Soft. Bowel sounds are normal. He exhibits no distension. There is no tenderness. There is no rebound and no guarding.  Musculoskeletal: He exhibits no edema.  Lymphadenopathy:    He has no cervical adenopathy.  Vitals reviewed.  Wt Readings from Last 3 Encounters:  05/10/17 183 lb (83 kg)  05/07/17 186 lb 3.2 oz (84.5 kg)  02/06/17 195 lb 12.8 oz (88.8 kg)          Assessment & Plan:  Weight loss - Plan: CBC with Differential/Platelet, COMPLETE METABOLIC PANEL WITH GFR, TSH, Lipid panel  I will be very interesting to see the results of the PET scan to see if there is any evidence of malignancy as a possible cause of his weight loss. I will also check a TSH to evaluate for hyperthyroidism. Given his hoarseness, and the enlarged paratracheal lymph nodes, I did recommend an ENT consultation for direct  laryngoscopy to rule out throat cancer.  If laryngoscopy and PET scan are unremarkable, consider referral to GI for an EGD. However I'm suspicious that his weight loss may be due to the fact he stated Topamax and is also had a drastic reduction in his insulin which is a growth hormone

## 2017-05-14 ENCOUNTER — Ambulatory Visit: Payer: PPO | Admitting: Internal Medicine

## 2017-05-16 DIAGNOSIS — N183 Chronic kidney disease, stage 3 (moderate): Secondary | ICD-10-CM | POA: Diagnosis not present

## 2017-05-16 DIAGNOSIS — E1142 Type 2 diabetes mellitus with diabetic polyneuropathy: Secondary | ICD-10-CM | POA: Diagnosis not present

## 2017-05-16 DIAGNOSIS — Z794 Long term (current) use of insulin: Secondary | ICD-10-CM | POA: Diagnosis not present

## 2017-05-16 DIAGNOSIS — E1165 Type 2 diabetes mellitus with hyperglycemia: Secondary | ICD-10-CM | POA: Diagnosis not present

## 2017-05-16 DIAGNOSIS — Z720 Tobacco use: Secondary | ICD-10-CM | POA: Diagnosis not present

## 2017-05-17 ENCOUNTER — Telehealth: Payer: Self-pay | Admitting: *Deleted

## 2017-05-17 NOTE — Telephone Encounter (Signed)
Called patient to inform of Pet Scan for 05-31-17- arrival time- 6:30 am, pt. to be NPO - after midnight, test to be @ [facility], spoke with patient and he is aware of this test.

## 2017-05-31 ENCOUNTER — Encounter (HOSPITAL_COMMUNITY)
Admission: RE | Admit: 2017-05-31 | Discharge: 2017-05-31 | Disposition: A | Discharge status: Home / Self Care | Payer: PPO | Source: Ambulatory Visit | Attending: Radiation Oncology | Admitting: Radiation Oncology

## 2017-05-31 DIAGNOSIS — C3432 Malignant neoplasm of lower lobe, left bronchus or lung: Secondary | ICD-10-CM | POA: Diagnosis not present

## 2017-05-31 DIAGNOSIS — C3412 Malignant neoplasm of upper lobe, left bronchus or lung: Secondary | ICD-10-CM | POA: Diagnosis not present

## 2017-05-31 LAB — GLUCOSE, CAPILLARY: GLUCOSE-CAPILLARY: 98 mg/dL (ref 65–99)

## 2017-05-31 MED ORDER — FLUDEOXYGLUCOSE F - 18 (FDG) INJECTION
9.1700 | Freq: Once | INTRAVENOUS | Status: AC | PRN
  Administered 2017-05-31: 9.17 via INTRAVENOUS

## 2017-06-05 ENCOUNTER — Telehealth: Payer: Self-pay | Admitting: Radiation Oncology

## 2017-06-05 ENCOUNTER — Other Ambulatory Visit: Payer: Self-pay | Admitting: Family Medicine

## 2017-06-05 NOTE — Telephone Encounter (Signed)
I left a message for [name] to call me back about discussion of his PET.

## 2017-06-06 DIAGNOSIS — R0981 Nasal congestion: Secondary | ICD-10-CM | POA: Diagnosis not present

## 2017-06-06 DIAGNOSIS — R0982 Postnasal drip: Secondary | ICD-10-CM | POA: Insufficient documentation

## 2017-06-06 DIAGNOSIS — J383 Other diseases of vocal cords: Secondary | ICD-10-CM | POA: Insufficient documentation

## 2017-06-06 DIAGNOSIS — R490 Dysphonia: Secondary | ICD-10-CM | POA: Insufficient documentation

## 2017-06-06 DIAGNOSIS — F17210 Nicotine dependence, cigarettes, uncomplicated: Secondary | ICD-10-CM | POA: Diagnosis not present

## 2017-06-06 NOTE — Telephone Encounter (Signed)
I spoke with the patient and his wife by phone today to discuss that at GI conference his case was presented and he needs to meet with GI for EGD. I called Dr. Perley Jain office and left a message asking him to call me back.

## 2017-06-07 DIAGNOSIS — R933 Abnormal findings on diagnostic imaging of other parts of digestive tract: Secondary | ICD-10-CM | POA: Diagnosis not present

## 2017-06-12 ENCOUNTER — Telehealth: Payer: Self-pay | Admitting: Radiation Oncology

## 2017-06-12 NOTE — Telephone Encounter (Signed)
LM for the patient to call me back re: his visit at GI last week.

## 2017-06-13 ENCOUNTER — Encounter: Payer: Self-pay | Admitting: Family Medicine

## 2017-06-13 ENCOUNTER — Other Ambulatory Visit: Payer: Self-pay | Admitting: Radiation Oncology

## 2017-06-13 ENCOUNTER — Telehealth: Payer: Self-pay | Admitting: Radiation Oncology

## 2017-06-13 DIAGNOSIS — C3411 Malignant neoplasm of upper lobe, right bronchus or lung: Secondary | ICD-10-CM

## 2017-06-13 DIAGNOSIS — K3189 Other diseases of stomach and duodenum: Secondary | ICD-10-CM

## 2017-06-13 DIAGNOSIS — C320 Malignant neoplasm of glottis: Secondary | ICD-10-CM | POA: Insufficient documentation

## 2017-06-13 NOTE — Telephone Encounter (Signed)
I spoke with the patient's wife yesterday while Epic was down and this is a delayed entry as a result. He has a history of multifocal stage I NSCLC and has been followed in surveillance for this with CT imaging. At his last scan in June, he had some thickening of the posterior stomach, a questionable mediastinal node, and about 20 pounds of weight loss over 6 months, and increased changes in terms of cold/heat intolerance, and increased productive cough.  He was also complaining of hoarseness. I requested he get into see ENT, GI, and endocrinology as well as a PET scan after a course of empiric antibiotics. He had his PET scan performed on 05/31/17 which revealed mildly hypermetabolic change in the mediastinal node felt to be reactive when reviewed with attending MD, and posterior stomach also revealed hypermetabolic activity. He has undergone laryngoscopy with Dr. Redmond Baseman and has a left vocal cord anomaly. He is planning to undergo biopsy with Dr. Redmond Baseman, but is awaiting to have this scheduled. He did undergo EGD on 06/07/17 with Dr. Watt Climes and this was negative for any visible abnormalities. He is planning to follow up with his PCP and endocrinologist. I will send this along to Dr. Dennard Schaumann to keep him involved as well. I discussed with his wife that I would recommend we repeat a CT chest and abdomen in 3 months to follow the changes in the stomach and chest. The patient's wife will keep me informed of next steps.      Carola Rhine, PAC

## 2017-06-14 NOTE — Telephone Encounter (Signed)
Thanks for letting me know!

## 2017-06-17 ENCOUNTER — Other Ambulatory Visit: Payer: Self-pay | Admitting: Otolaryngology

## 2017-06-24 ENCOUNTER — Encounter (HOSPITAL_COMMUNITY): Payer: Self-pay

## 2017-06-24 NOTE — Progress Notes (Signed)
   How to Manage Your Diabetes Before and After Surgery  Why is it important to control my blood sugar before and after surgery? . Improving blood sugar levels before and after surgery helps healing and can limit problems. . A way of improving blood sugar control is eating a healthy diet by: o  Eating less sugar and carbohydrates o  Increasing activity/exercise o  Talking with your doctor about reaching your blood sugar goals . High blood sugars (greater than 180 mg/dL) can raise your risk of infections and slow your recovery, so you will need to focus on controlling your diabetes during the weeks before surgery. . Make sure that the doctor who takes care of your diabetes knows about your planned surgery including the date and location.  How do I manage my blood sugar before surgery? . Check your blood sugar at least 4 times a day, starting 2 days before surgery, to make sure that the level is not too high or low. o Check your blood sugar the morning of your surgery when you wake up and every 2 hours until you get to the Short Stay unit. . If your blood sugar is less than 70 mg/dL, you will need to treat for low blood sugar: o Do not take insulin. o Treat a low blood sugar (less than 70 mg/dL) with  cup of clear juice (cranberry or apple), 4 glucose tablets, OR glucose gel. o Recheck blood sugar in 15 minutes after treatment (to make sure it is greater than 70 mg/dL). If your blood sugar is not greater than 70 mg/dL on recheck, call [phone] for further instructions. . Report your blood sugar to the short stay nurse when you get to Short Stay.  . If you are admitted to the hospital after surgery: o Your blood sugar will be checked by the staff and you will probably be given insulin after surgery (instead of oral diabetes medicines) to make sure you have good blood sugar levels. o The goal for blood sugar control after surgery is 80-180 mg/dL.  WHAT DO I DO ABOUT MY DIABETES  MEDICATION?   Marland Kitchen Do not take oral diabetes medicines (pills) the morning of surgery.     . THE MORNING OF SURGERY, take 7 units of Lantus insulin.  Patient Signature:  Date:   Nurse Signature:  Date:

## 2017-06-24 NOTE — Pre-Procedure Instructions (Signed)
DAVIDLEE JEANBAPTISTE  06/24/2017      [place], [place] - [address] [place] [address] Phone: [phone] Fax: [phone]  [id number] - [place], [place] [address] [place] [address] Phone: [phone] Fax: [phone]   Your procedure is scheduled on  Friday, August 24.  Report to [facility] Admitting at 8:30 AM                 Your surgery or procedure is scheduled for 10:30 A.M.   Call this number if you have problems the morning of surgery: [phone]- pre- op desk                      For any other questions, please call [phone], Monday - Friday 8 AM - 4 PM.  Remember:  Do not eat food or drink liquids after midnight Thursday, August 23.  Take these medicines the morning of surgery with A SIP OF WATER :ranitidine (ZANTAC),   cyclobenzaprine (FLEXERIL),  diltiazem (CARDIZEM CD),   FLUoxetine (PROZAC),   gabapentin (NEURONTIN),   levofloxacin (LEVAQUIN),   topiramate (TOPAMAX).   May use Fluticasone (Flonase) nasal spray.                If needed: ipratropium-albuterol (DUONEB) nebulizer. My take Nitroglycerin if needed.      If CBG is greater than 70 take 1/2 of your scheduled Lantus dose- 7 units.  See separate sheet for more instructions regarding diabetes.  Special instructions:  [place]- Preparing For Surgery  Before surgery, you can play an important role. Because skin is not sterile, your skin needs to be as free of germs as possible. You can reduce the number of germs on your skin by washing with CHG (chlorahexidine gluconate) Soap before surgery.  CHG is an antiseptic cleaner which kills germs and bonds with the skin to continue killing germs even after washing.  Please do not use if you have an allergy to CHG or antibacterial soaps. If your skin becomes reddened/irritated stop using the CHG.  Do not  shave (including legs and underarms) for at least 48 hours prior to first CHG shower. It is OK to shave your face.  Please follow these instructions carefully.   1. Shower the NIGHT BEFORE SURGERY and the MORNING OF SURGERY with CHG.   2. If you chose to wash your hair, wash your hair first as usual with your normal shampoo.  3. After you shampoo, rinse your hair and body thoroughly to remove the shampoo. 4.  Wash your face and private area with the soap you use at home, then rinse. 5. Use CHG as you would any other liquid soap. You can apply CHG directly to the skin and wash gently with a scrungie or a clean washcloth.   6. Apply the CHG Soap to your body ONLY FROM THE NECK DOWN.  Do not use on open wounds or open sores. Avoid contact with your eyes, ears, mouth and genitals (private parts). Wash genitals (private parts) with your normal soap.  7. Wash thoroughly, paying special attention to the area where your surgery will be performed.  8. Thoroughly rinse your body with warm water from the neck down.  9. DO NOT shower/wash with your normal soap after using and rinsing off the CHG Soap.  10. Pat yourself dry with a  CLEAN TOWEL.   11. Wear CLEAN PAJAMAS   12. Place CLEAN SHEETS on your bed the night of your first shower and DO NOT SLEEP WITH PETS.  Day of Surgery: Shower as above Do not apply any deodorants/lotions, powders or colognes.. Please wear clean clothes to the hospital/surgery center.   Do not wear jewelry, make-up or nail polish.  Do not shave 48 hours prior to surgery.  Men may shave face and neck.  Do not bring valuables to the hospital.  [facility] is not responsible for any belongings or valuables.  Contacts, dentures or bridgework may not be worn into surgery.  Leave your suitcase in the car.  After surgery it may be brought to your room.  For patients admitted to the hospital, discharge time will be determined by your treatment team.  Patients discharged the  day of surgery will not be allowed to drive home.   Name and phone number of your driver:   - Please read over the following fact sheets that you were given: Pain Booklet, Coughing and Deep Breathing, Surgical Site Infections.

## 2017-06-25 ENCOUNTER — Encounter (HOSPITAL_COMMUNITY)
Admission: RE | Admit: 2017-06-25 | Discharge: 2017-06-25 | Disposition: A | Discharge status: Home / Self Care | Payer: PPO | Source: Ambulatory Visit | Attending: Otolaryngology | Admitting: Otolaryngology

## 2017-06-25 ENCOUNTER — Encounter: Payer: Self-pay | Admitting: Family Medicine

## 2017-06-25 DIAGNOSIS — I10 Essential (primary) hypertension: Secondary | ICD-10-CM

## 2017-06-25 DIAGNOSIS — G4730 Sleep apnea, unspecified: Secondary | ICD-10-CM | POA: Diagnosis not present

## 2017-06-25 DIAGNOSIS — G4733 Obstructive sleep apnea (adult) (pediatric): Secondary | ICD-10-CM | POA: Insufficient documentation

## 2017-06-25 DIAGNOSIS — D649 Anemia, unspecified: Secondary | ICD-10-CM | POA: Insufficient documentation

## 2017-06-25 DIAGNOSIS — Z85118 Personal history of other malignant neoplasm of bronchus and lung: Secondary | ICD-10-CM | POA: Insufficient documentation

## 2017-06-25 DIAGNOSIS — J449 Chronic obstructive pulmonary disease, unspecified: Secondary | ICD-10-CM

## 2017-06-25 DIAGNOSIS — F17210 Nicotine dependence, cigarettes, uncomplicated: Secondary | ICD-10-CM | POA: Diagnosis not present

## 2017-06-25 DIAGNOSIS — D141 Benign neoplasm of larynx: Secondary | ICD-10-CM | POA: Diagnosis not present

## 2017-06-25 DIAGNOSIS — E1140 Type 2 diabetes mellitus with diabetic neuropathy, unspecified: Secondary | ICD-10-CM | POA: Insufficient documentation

## 2017-06-25 DIAGNOSIS — Z7982 Long term (current) use of aspirin: Secondary | ICD-10-CM | POA: Diagnosis not present

## 2017-06-25 DIAGNOSIS — Z95818 Presence of other cardiac implants and grafts: Secondary | ICD-10-CM

## 2017-06-25 DIAGNOSIS — N189 Chronic kidney disease, unspecified: Secondary | ICD-10-CM | POA: Insufficient documentation

## 2017-06-25 DIAGNOSIS — F17200 Nicotine dependence, unspecified, uncomplicated: Secondary | ICD-10-CM | POA: Insufficient documentation

## 2017-06-25 DIAGNOSIS — G4700 Insomnia, unspecified: Secondary | ICD-10-CM | POA: Diagnosis not present

## 2017-06-25 DIAGNOSIS — R490 Dysphonia: Secondary | ICD-10-CM | POA: Insufficient documentation

## 2017-06-25 DIAGNOSIS — I129 Hypertensive chronic kidney disease with stage 1 through stage 4 chronic kidney disease, or unspecified chronic kidney disease: Secondary | ICD-10-CM

## 2017-06-25 DIAGNOSIS — Z9889 Other specified postprocedural states: Secondary | ICD-10-CM

## 2017-06-25 DIAGNOSIS — K449 Diaphragmatic hernia without obstruction or gangrene: Secondary | ICD-10-CM

## 2017-06-25 DIAGNOSIS — Z79899 Other long term (current) drug therapy: Secondary | ICD-10-CM | POA: Diagnosis not present

## 2017-06-25 DIAGNOSIS — I2510 Atherosclerotic heart disease of native coronary artery without angina pectoris: Secondary | ICD-10-CM | POA: Diagnosis not present

## 2017-06-25 DIAGNOSIS — K219 Gastro-esophageal reflux disease without esophagitis: Secondary | ICD-10-CM

## 2017-06-25 DIAGNOSIS — Z01812 Encounter for preprocedural laboratory examination: Secondary | ICD-10-CM | POA: Insufficient documentation

## 2017-06-25 DIAGNOSIS — H409 Unspecified glaucoma: Secondary | ICD-10-CM

## 2017-06-25 DIAGNOSIS — I252 Old myocardial infarction: Secondary | ICD-10-CM | POA: Diagnosis not present

## 2017-06-25 DIAGNOSIS — Z955 Presence of coronary angioplasty implant and graft: Secondary | ICD-10-CM | POA: Diagnosis not present

## 2017-06-25 DIAGNOSIS — F329 Major depressive disorder, single episode, unspecified: Secondary | ICD-10-CM

## 2017-06-25 DIAGNOSIS — Z794 Long term (current) use of insulin: Secondary | ICD-10-CM | POA: Diagnosis not present

## 2017-06-25 DIAGNOSIS — E1122 Type 2 diabetes mellitus with diabetic chronic kidney disease: Secondary | ICD-10-CM | POA: Insufficient documentation

## 2017-06-25 DIAGNOSIS — E079 Disorder of thyroid, unspecified: Secondary | ICD-10-CM | POA: Diagnosis not present

## 2017-06-25 LAB — CBC
HCT: 37.2 % — ABNORMAL LOW (ref 39.0–52.0)
HEMOGLOBIN: 12.8 g/dL — AB (ref 13.0–17.0)
MCH: 31.5 pg (ref 26.0–34.0)
MCHC: 34.4 g/dL (ref 30.0–36.0)
MCV: 91.6 fL (ref 78.0–100.0)
Platelets: 196 10*3/uL (ref 150–400)
RBC: 4.06 MIL/uL — AB (ref 4.22–5.81)
RDW: 13.5 % (ref 11.5–15.5)
WBC: 9.1 10*3/uL (ref 4.0–10.5)

## 2017-06-25 LAB — BASIC METABOLIC PANEL
ANION GAP: 8 (ref 5–15)
BUN: 27 mg/dL — ABNORMAL HIGH (ref 6–20)
CALCIUM: 8.9 mg/dL (ref 8.9–10.3)
CO2: 20 mmol/L — ABNORMAL LOW (ref 22–32)
Chloride: 110 mmol/L (ref 101–111)
Creatinine, Ser: 1.37 mg/dL — ABNORMAL HIGH (ref 0.61–1.24)
GFR, EST NON AFRICAN AMERICAN: 52 mL/min — AB (ref >60)
Glucose, Bld: 132 mg/dL — ABNORMAL HIGH (ref 65–99)
Potassium: 4.5 mmol/L (ref 3.5–5.1)
Sodium: 138 mmol/L (ref 135–145)

## 2017-06-25 LAB — GLUCOSE, CAPILLARY: GLUCOSE-CAPILLARY: 104 mg/dL — AB (ref 65–99)

## 2017-06-25 NOTE — Progress Notes (Signed)
EBENEZER MCCASKEY            06/24/2017                          [place], [place] - [address] [place] [address] Phone: [phone] Fax: [phone]  [id number] - [place], [place] [address] [place] [address] Phone: [phone] Fax: [phone]             Your procedure is scheduled on  Friday, August 24.            Report to [facility] Admitting at 8:30 AM                 Your surgery or procedure is scheduled for 10:30 A.M.             Call this number if you have problems the morning of surgery: [phone]- pre- op desk                      For any other questions, please call [phone], Monday - Friday 8 AM - 4 PM.            Remember:            Do not eat food or drink liquids after midnight Thursday, August 23.            Take these medicines the morning of surgery with A SIP OF WATER :ranitidine (ZANTAC),   cyclobenzaprine (FLEXERIL),  diltiazem (CARDIZEM CD),   FLUoxetine (PROZAC),   gabapentin (NEURONTIN),   levofloxacin (LEVAQUIN),   topiramate (TOPAMAX).   May use Fluticasone (Flonase) nasal spray.                If needed: ipratropium-albuterol (DUONEB) nebulizer. My take Nitroglycerin if needed.  How to Manage Your Diabetes Before and After Surgery  Why is it important to control my blood sugar before and after surgery? . Improving blood sugar levels before and after surgery helps healing and can limit problems. . A way of improving blood sugar control is eating a healthy diet by: o  Eating less sugar and carbohydrates o  Increasing activity/exercise o  Talking with your doctor about reaching your blood sugar goals . High blood sugars (greater than 180 mg/dL) can raise your risk of infections and slow your recovery, so you will need to focus on controlling your diabetes during the weeks before  surgery. . Make sure that the doctor who takes care of your diabetes knows about your planned surgery including the date and location.  How do I manage my blood sugar before surgery? . Check your blood sugar at least 4 times a day, starting 2 days before surgery, to make sure that the level is not too high or low. o Check your blood sugar the morning of your surgery when you wake up and every 2 hours until you get to the Short Stay unit. . If your blood sugar is less than 70 mg/dL, you will need to treat for low blood sugar: o Do not take insulin. o Treat a low blood sugar (less than 70 mg/dL) with  cup of clear juice (cranberry or apple), 4 glucose tablets, OR glucose gel. o Recheck blood sugar in 15 minutes after treatment (to make sure it is greater than 70 mg/dL).  If your blood sugar is not greater than 70 mg/dL on recheck, call [phone] for further instructions. . Report your blood sugar to the short stay nurse when you get to Short Stay.  . If you are admitted to the hospital after surgery: o Your blood sugar will be checked by the staff and you will probably be given insulin after surgery (instead of oral diabetes medicines) to make sure you have good blood sugar levels. o The goal for blood sugar control after surgery is 80-180 mg/dL.  WHAT DO I DO ABOUT MY DIABETES MEDICATION?   Marland Kitchen Do not take oral diabetes medicines (pills) the morning of surgery.  . THE DAY BEFORE SURGERY, take _____12______ units of _____LANTUS______insulin.       . THE MORNING OF SURGERY, take ______6_______ units of _____LANTUS_____insulin.  . The day of surgery, do not take other diabetes injectables, including Byetta (exenatide), Bydureon (exenatide ER), Victoza (liraglutide), or Trulicity (dulaglutide).  Special instructions:  [place]- Preparing For Surgery  Before surgery, you can play an important role. Because skin is not sterile, your skin needs to be as free of germs as possible. You can  reduce the number of germs on your skin by washing with CHG (chlorahexidine gluconate) Soap before surgery.  CHG is an antiseptic cleaner which kills germs and bonds with the skin to continue killing germs even after washing.  Please do not use if you have an allergy to CHG or antibacterial soaps. If your skin becomes reddened/irritated stop using the CHG.  Do not shave (including legs and underarms) for at least 48 hours prior to first CHG shower. It is OK to shave your face.  Please follow these instructions carefully.                                                                                                                     1. Shower the NIGHT BEFORE SURGERY and the MORNING OF SURGERY with CHG.   2. If you chose to wash your hair, wash your hair first as usual with your normal shampoo.  3. After you shampoo, rinse your hair and body thoroughly to remove the shampoo. 4.  Wash your face and private area with the soap you use at home, then rinse. 5. Use CHG as you would any other liquid soap. You can apply CHG directly to the skin and wash gently with a scrungie or a clean washcloth.   6. Apply the CHG Soap to your body ONLY FROM THE NECK DOWN.  Do not use on open wounds or open sores. Avoid contact with your eyes, ears, mouth and genitals (private parts). Wash genitals (private parts) with your normal soap.  7. Wash thoroughly, paying special attention to the area where your surgery will be performed.  8. Thoroughly rinse your body with warm water from the neck down.  9. DO NOT shower/wash with your normal soap after using and rinsing off the CHG Soap.  10. Pat yourself dry with a CLEAN TOWEL.  11. Wear CLEAN PAJAMAS   12. Place CLEAN SHEETS on your bed the night of your first shower and DO NOT SLEEP WITH PETS.  Day of Surgery: Shower as above Do not apply any deodorants/lotions, powders or colognes.. Please wear clean clothes to the hospital/surgery center.              Do not wear jewelry.            Do not shave 48 hours prior to surgery.  Men may shave face and neck.            Do not bring valuables to the hospital.            [facility] is not responsible for any belongings or valuables.  Contacts, dentures or bridgework may not be worn into surgery.  Leave your suitcase in the car.  After surgery it may be brought to your room.  For patients admitted to the hospital, discharge time will be determined by your treatment team.  Patients discharged the day of surgery will not be allowed to drive home.   Please read over the following fact sheets that you were given: Pain Booklet, Coughing and Deep Breathing, Surgical Site Infections.

## 2017-06-25 NOTE — Progress Notes (Signed)
PCP - Dr. Jenna Luo- [place]  Cardiologist - Dr. Daneen Schick  Endocrinologist - Dr. Bobbye Morton  Chest x-ray - 08/24/16 (E)  EKG - 09/24/16 (E)  Stress Test - Denies  ECHO - 2014 (E)  Cardiac Cath - 2006, 2008, 2009  Sleep Study - Yes CPAP - None- Pt sts he has not used it in 7 years  Fasting Blood Sugar - 130- Today 104 Checks Blood Sugar ___3__ times a day  Chart will be given to anesthesia for review due to requested records of recent A1C. Last result from 08/23/16 was 7.2. Pt also has an extensive cardiac history.  Pt denies having chest pain, sob, or fever at this time. All instructions explained to the pt, with a verbal understanding of the material. Pt agrees to go over the instructions while at home for a better understanding. The opportunity to ask questions was provided.

## 2017-06-26 NOTE — Progress Notes (Signed)
Anesthesia Chart Review: Patient is a 66 year old male scheduled for [facility] laryngoscopy with carbon dioxide laser excision of vocal cord lesion on 06/28/2017 by Dr. Melida Quitter. Patient seen for hoarseness with finding of abnormal right vocal fold and needs further evaluation for potential neoplasm.    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 In 2012 thyroid and parathyroid disease was entered into his history, but in recent records I have not found evidence to support this so far. This history is also not noted in his 05/16/17 endocrinology office note. He denied CP or acute SOB at PAT.   - PCP is Dr. Jenna Luo, last visit 05/10/17. He referred patient to ENT for direct laryngoscopy to evaluate hoarseness.   - Endocrinologist is Dr. Delrae Rend, last visit 05/16/17.  - Pulmonologist is Dr. Chase Caller. Last visit with Rexene Edison, NP on 02/06/17. - RAD-ONC is Dr. Jeneen Rinks [place]/Dr. Tyler Pita. - Cardiologist is Dr. Daneen Schick, last visit 09/24/16. Patient's CAD felt stable without chest discomfort or Nitro use. Smoking cessation recommended. One year follow-up recommended. - CT surgeon is Dr. Modesto Charon. - GI is Dr. Clarene Essex. By notes, he is evaluating patient for gastric abnormality on PET and is considering EGD.   Meds include aspirin 81 mg, Lipitor, Flexeril, Cardizem CD, Prozac, Flonase, Neurontin, DuoNeb, Lantus, losartan, nitroglycerin, Zantac, Topamax, trazodone.   BP 127/60   Pulse 67   Temp 36.6 C   Resp 20   Ht 5\' 11"  (1.803 m)   Wt 185 lb 14.4 oz (84.3 kg)   SpO2 99%   BMI 25.93 kg/m   EKG 09/24/16: NSR.    Echo 06/05/13: Conclusions: 1.  There is moderate concentric LVH. 2. Left ventricular ejection fraction estimated at 50-55%. 3. Mild left atrial enlargement. 4. Trace mitral valve regurgitation. 5. Aortic valve is sclerotic but opens well. 6. Doppler findings suggest grade 2 diastolic dysfunction with elevated left atrial pressure. 7. There is mild tricuspid regurgitation. 8. Normal estimated RVSP. 9. Suboptimal image quality due to poor sound wave transmission.  Cardiac cath 06/15/08: RESULTS: 1. Hemodynamic data: a. Left ventricular pressure 117/12. b. Aortic pressure 116/66. 2. Left ventriculography: The left ventricle is moderate in size. Contractility is low normal. EF is 55%. No regional abnormalities noted. No mitral regurgitation. 3. Coronary angiography. a. Left main coronary: Widely patent and calcified. b. Left anterior descending coronary: Widely patent with regions of hypodensity and eccentric stenoses noted throughout the midvessel between the large diagonal branch and the smaller second diagonal branch. c. Circumflex artery: Generalized segmental 50-60% narrowing is noted in the proximal and mid circumflex. No high-grade obstructions are seen. d. Ramus branch: This branch is large and widely patent. e. Right coronary: Dominant. Large PDA and 2 left ventricular branches. The distal stent in the right coronary is widely patent. The proximal vessel contains luminal irregularities. 4. Intravascular ultrasound: In the proximal LAD, there is a focal region of circumferential calcium with cross-sectional area slightly greater than 6 mm square. The tightest region in the mid vessel where moderate positive remodeling is noted and regions of calcium that are noncircumferential is around 4.2 mm square. CONCLUSIONS: 1. Moderate circumflex and LAD disease. No angiographic  or intravascular ultrasound evidence  of hemodynamically significant lesions. 2. Widely patent stent in the right coronary. 3. Normal left ventricular function. PLAN: Aggressive risk factor modification. Encouraged to stop smoking. Diabetes control.  Carotid U/S 06/05/13: Conclusion: Mild bilateral carotid plaque without significant stenosis.  PET scan 05/31/17: IMPRESSION: 1. Low-grade activity in a mildly enlarged right lower paratracheal lymph node with maximum SUV of 4.1 (formerly 4.6). 2. Low-grade activity in indistinct left hilar lymph node, maximum SUV 3.0. 3. 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 Surveillance of the region is likely warranted. 4. Diffuse accentuated activity in the proximal 75% of the stomach with mild wall thickening but also nondistention. Appearance may reflect gastritis or physiologic activity. 5. Other imaging findings of potential clinical significance: Small 8 mucous retention cyst in the left maxillary sinus. Coronary atherosclerosis. Aortic Atherosclerosis (ICD10-I70.0) and Emphysema (ICD10-J43.9). Bilateral photopenic renal cysts. Prominent stool throughout the colon favors constipation. Prior right upper lobectomy. Airway thickening is present, suggesting bronchitis or reactive airways disease.  Chest CT 05/06/17: IMPRESSION: 1. Status post right upper lobectomy, without recurrent disease. 2. Similar mild thoracic adenopathy, favoring a reactive etiology. 3. Coronary artery atherosclerosis. Aortic Atherosclerosis (ICD10-I70.0). 4. Apparent greater curvature gastric wall thickening could be due to underdistention. Correlate with any symptoms to suggest gastric pathology. 5. Decrease nodularity within the area of presumed  radiation change in the superior segment left lower lobe. 6. Emphysema (ICD10-J43.9). Nonspecific lower lobe predominant ground-glass is similar.  PFTs 02/13/17: FVC 4.46 (102%), FEV1 2.85 (87%), FEF 25-75% 1.47 (57%), DLCO cor 17.81 (57%). Isolated reduction in diffusion to 57% otherwise normal.   Preoperative labs noted. Glucose 132. Cr 1.37. H/H 12.8/37.2. PLT 196. A1c 6.6 on 05/16/17 [facility]).  Patient with known CAD with cardiology follow-up within the past year. Continues to deny chest pain. Also with recent PCP evaluation with referral to ENT for this procedure. If no acute changes then I anticipate that he can proceed as planned.   George Hugh [facility] Short Stay Center/Anesthesiology Phone [phone] 06/26/2017 12:40 PM

## 2017-06-28 ENCOUNTER — Encounter (HOSPITAL_COMMUNITY)
Admission: RE | Disposition: A | Discharge status: Home / Self Care | Payer: Self-pay | Source: Ambulatory Visit | Attending: Otolaryngology

## 2017-06-28 ENCOUNTER — Encounter (HOSPITAL_COMMUNITY): Payer: Self-pay | Admitting: Urology

## 2017-06-28 ENCOUNTER — Ambulatory Visit (HOSPITAL_COMMUNITY): Payer: PPO | Admitting: Certified Registered"

## 2017-06-28 ENCOUNTER — Ambulatory Visit (HOSPITAL_COMMUNITY)
Admission: RE | Admit: 2017-06-28 | Discharge: 2017-06-28 | Disposition: A | Discharge status: Home / Self Care | Payer: PPO | Source: Ambulatory Visit | Attending: Otolaryngology | Admitting: Otolaryngology

## 2017-06-28 ENCOUNTER — Ambulatory Visit (HOSPITAL_COMMUNITY): Payer: PPO | Admitting: Emergency Medicine

## 2017-06-28 DIAGNOSIS — J383 Other diseases of vocal cords: Secondary | ICD-10-CM | POA: Diagnosis not present

## 2017-06-28 DIAGNOSIS — D141 Benign neoplasm of larynx: Secondary | ICD-10-CM | POA: Diagnosis not present

## 2017-06-28 DIAGNOSIS — J449 Chronic obstructive pulmonary disease, unspecified: Secondary | ICD-10-CM | POA: Insufficient documentation

## 2017-06-28 DIAGNOSIS — Z955 Presence of coronary angioplasty implant and graft: Secondary | ICD-10-CM | POA: Diagnosis not present

## 2017-06-28 DIAGNOSIS — I129 Hypertensive chronic kidney disease with stage 1 through stage 4 chronic kidney disease, or unspecified chronic kidney disease: Secondary | ICD-10-CM | POA: Diagnosis not present

## 2017-06-28 DIAGNOSIS — Z85118 Personal history of other malignant neoplasm of bronchus and lung: Secondary | ICD-10-CM | POA: Insufficient documentation

## 2017-06-28 DIAGNOSIS — E1122 Type 2 diabetes mellitus with diabetic chronic kidney disease: Secondary | ICD-10-CM | POA: Diagnosis not present

## 2017-06-28 DIAGNOSIS — E1140 Type 2 diabetes mellitus with diabetic neuropathy, unspecified: Secondary | ICD-10-CM | POA: Insufficient documentation

## 2017-06-28 DIAGNOSIS — G4700 Insomnia, unspecified: Secondary | ICD-10-CM | POA: Insufficient documentation

## 2017-06-28 DIAGNOSIS — I252 Old myocardial infarction: Secondary | ICD-10-CM | POA: Diagnosis not present

## 2017-06-28 DIAGNOSIS — Z79899 Other long term (current) drug therapy: Secondary | ICD-10-CM | POA: Insufficient documentation

## 2017-06-28 DIAGNOSIS — E079 Disorder of thyroid, unspecified: Secondary | ICD-10-CM | POA: Insufficient documentation

## 2017-06-28 DIAGNOSIS — Z794 Long term (current) use of insulin: Secondary | ICD-10-CM | POA: Insufficient documentation

## 2017-06-28 DIAGNOSIS — K219 Gastro-esophageal reflux disease without esophagitis: Secondary | ICD-10-CM | POA: Insufficient documentation

## 2017-06-28 DIAGNOSIS — G4730 Sleep apnea, unspecified: Secondary | ICD-10-CM | POA: Insufficient documentation

## 2017-06-28 DIAGNOSIS — Z7982 Long term (current) use of aspirin: Secondary | ICD-10-CM | POA: Insufficient documentation

## 2017-06-28 DIAGNOSIS — R490 Dysphonia: Secondary | ICD-10-CM | POA: Diagnosis not present

## 2017-06-28 DIAGNOSIS — F329 Major depressive disorder, single episode, unspecified: Secondary | ICD-10-CM | POA: Insufficient documentation

## 2017-06-28 DIAGNOSIS — F17210 Nicotine dependence, cigarettes, uncomplicated: Secondary | ICD-10-CM | POA: Insufficient documentation

## 2017-06-28 DIAGNOSIS — I2510 Atherosclerotic heart disease of native coronary artery without angina pectoris: Secondary | ICD-10-CM | POA: Diagnosis not present

## 2017-06-28 DIAGNOSIS — N189 Chronic kidney disease, unspecified: Secondary | ICD-10-CM | POA: Diagnosis not present

## 2017-06-28 HISTORY — PX: MICROLARYNGOSCOPY WITH CO2 LASER AND EXCISION OF VOCAL CORD LESION: SHX5970

## 2017-06-28 LAB — GLUCOSE, CAPILLARY
Glucose-Capillary: 126 mg/dL — ABNORMAL HIGH (ref 65–99)
Glucose-Capillary: 111 mg/dL — ABNORMAL HIGH (ref 65–99)

## 2017-06-28 SURGERY — MICROLARYNGOSCOPY WITH CO2 LASER AND EXCISION OF VOCAL CORD LESION
Anesthesia: General | Site: Mouth

## 2017-06-28 MED ORDER — FENTANYL CITRATE (PF) 100 MCG/2ML IJ SOLN: 25.0000 ug | INTRAMUSCULAR | Status: DC | PRN

## 2017-06-28 MED ORDER — EPINEPHRINE HCL (NASAL) 0.1 % NA SOLN
NASAL | Status: AC
  Filled 2017-06-28: qty 30

## 2017-06-28 MED ORDER — PROPOFOL 1000 MG/100ML IV EMUL
INTRAVENOUS | Status: AC
  Filled 2017-06-28: qty 100

## 2017-06-28 MED ORDER — DEXAMETHASONE SODIUM PHOSPHATE 10 MG/ML IJ SOLN
INTRAMUSCULAR | Status: DC | PRN
  Administered 2017-06-28: 10 mg via INTRAVENOUS

## 2017-06-28 MED ORDER — SUFENTANIL CITRATE 50 MCG/ML IV SOLN
INTRAVENOUS | Status: DC | PRN
  Administered 2017-06-28: 20 ug via INTRAVENOUS

## 2017-06-28 MED ORDER — LACTATED RINGERS IV SOLN
INTRAVENOUS | Status: DC
  Administered 2017-06-28: 09:00:00 via INTRAVENOUS

## 2017-06-28 MED ORDER — PHENYLEPHRINE 40 MCG/ML (10ML) SYRINGE FOR IV PUSH (FOR BLOOD PRESSURE SUPPORT)
PREFILLED_SYRINGE | INTRAVENOUS | Status: DC | PRN
  Administered 2017-06-28 (×2): 120 ug via INTRAVENOUS

## 2017-06-28 MED ORDER — MIDAZOLAM HCL 2 MG/2ML IJ SOLN
INTRAMUSCULAR | Status: AC
  Filled 2017-06-28: qty 2

## 2017-06-28 MED ORDER — OXYCODONE HCL 5 MG PO TABS: 5.0000 mg | ORAL_TABLET | Freq: Once | ORAL | Status: DC | PRN

## 2017-06-28 MED ORDER — ROCURONIUM BROMIDE 10 MG/ML (PF) SYRINGE
PREFILLED_SYRINGE | INTRAVENOUS | Status: DC | PRN
  Administered 2017-06-28: 40 mg via INTRAVENOUS

## 2017-06-28 MED ORDER — EPINEPHRINE PF 1 MG/ML IJ SOLN
INTRAMUSCULAR | Status: DC | PRN
  Administered 2017-06-28: 1 mg

## 2017-06-28 MED ORDER — PROPOFOL 10 MG/ML IV BOLUS
INTRAVENOUS | Status: DC | PRN
  Administered 2017-06-28: 130 mg via INTRAVENOUS

## 2017-06-28 MED ORDER — SUFENTANIL CITRATE 50 MCG/ML IV SOLN
INTRAVENOUS | Status: AC
  Filled 2017-06-28: qty 1

## 2017-06-28 MED ORDER — PROPOFOL 10 MG/ML IV BOLUS
INTRAVENOUS | Status: AC
  Filled 2017-06-28: qty 20

## 2017-06-28 MED ORDER — ONDANSETRON HCL 4 MG/2ML IJ SOLN: 4.0000 mg | Freq: Four times a day (QID) | INTRAMUSCULAR | Status: DC | PRN

## 2017-06-28 MED ORDER — MIDAZOLAM HCL 5 MG/5ML IJ SOLN
INTRAMUSCULAR | Status: DC | PRN
  Administered 2017-06-28: 2 mg via INTRAVENOUS

## 2017-06-28 MED ORDER — ONDANSETRON HCL 4 MG/2ML IJ SOLN
INTRAMUSCULAR | Status: DC | PRN
  Administered 2017-06-28: 4 mg via INTRAVENOUS

## 2017-06-28 MED ORDER — SUGAMMADEX SODIUM 200 MG/2ML IV SOLN
INTRAVENOUS | Status: DC | PRN
  Administered 2017-06-28: 200 mg via INTRAVENOUS

## 2017-06-28 MED ORDER — LIDOCAINE 2% (20 MG/ML) 5 ML SYRINGE
INTRAMUSCULAR | Status: DC | PRN
  Administered 2017-06-28: 60 mg via INTRAVENOUS

## 2017-06-28 MED ORDER — OXYCODONE HCL 5 MG/5ML PO SOLN: 5.0000 mg | Freq: Once | ORAL | Status: DC | PRN

## 2017-06-28 SURGICAL SUPPLY — 19 items
CANISTER SUCT 3000ML PPV (MISCELLANEOUS) ×3 IMPLANT
CONT SPEC 4OZ CLIKSEAL STRL BL (MISCELLANEOUS) ×4 IMPLANT
COVER BACK TABLE 60X90IN (DRAPES) ×3 IMPLANT
CRADLE DONUT ADULT HEAD (MISCELLANEOUS) ×2 IMPLANT
DRAPE HALF SHEET 40X57 (DRAPES) ×3 IMPLANT
GAUZE SPONGE 4X4 12PLY STRL (GAUZE/BANDAGES/DRESSINGS) ×3 IMPLANT
GLOVE BIO SURGEON STRL SZ7.5 (GLOVE) ×3 IMPLANT
GOWN STRL REUS W/ TWL LRG LVL3 (GOWN DISPOSABLE) IMPLANT
GOWN STRL REUS W/TWL LRG LVL3 (GOWN DISPOSABLE) ×3
KIT BASIN OR (CUSTOM PROCEDURE TRAY) ×3 IMPLANT
KIT ROOM TURNOVER OR (KITS) ×3 IMPLANT
NS IRRIG 1000ML POUR BTL (IV SOLUTION) ×3 IMPLANT
PAD ARMBOARD 7.5X6 YLW CONV (MISCELLANEOUS) ×6 IMPLANT
PATTIES SURGICAL .5 X3 (DISPOSABLE) ×3 IMPLANT
SOLUTION ANTI FOG 6CC (MISCELLANEOUS) ×2 IMPLANT
TOWEL OR 17X24 6PK STRL BLUE (TOWEL DISPOSABLE) ×4 IMPLANT
TUBE CONNECTING 12'X1/4 (SUCTIONS) ×1
TUBE CONNECTING 12X1/4 (SUCTIONS) ×2 IMPLANT
WATER STERILE IRR 1000ML POUR (IV SOLUTION) ×3 IMPLANT

## 2017-06-28 NOTE — Anesthesia Procedure Notes (Signed)
Procedure Name: Intubation Date/Time: 06/28/2017 11:18 AM Performed by: Melina Copa, Sarin [place] R Pre-anesthesia Checklist: Patient identified, Emergency Drugs available, Suction available and Patient being monitored Patient Re-evaluated:Patient Re-evaluated prior to induction Oxygen Delivery Method: Circle System Utilized Preoxygenation: Pre-oxygenation with 100% oxygen Induction Type: IV induction Ventilation: Mask ventilation without difficulty Laryngoscope Size: Mac and 4 Grade View: Grade I Tube type: Oral Tube size: 6.5 mm Number of attempts: 1 Airway Equipment and Method: Stylet Placement Confirmation: ETT inserted through vocal cords under direct vision,  positive ETCO2 and breath sounds checked- equal and bilateral Secured at: 22 cm Tube secured with: Tape Dental Injury: Teeth and Oropharynx as per pre-operative assessment

## 2017-06-28 NOTE — Op Note (Signed)
NAMEKNOX, CERVI             ACCOUNT NO.:  [id number]  MEDICAL RECORD NO.:  [id number]  LOCATION:  [facility]                         FACILITY:  [facility]  PHYSICIAN:  Onnie Graham, MD     DATE OF BIRTH:  1951/07/30  DATE OF PROCEDURE:  06/28/2017 DATE OF DISCHARGE:                              OPERATIVE REPORT   PREOPERATIVE DIAGNOSES: 1. Dysphonia. 2. Right vocal fold lesion.  POSTOPERATIVE DIAGNOSES: 1. Dysphonia. 2. Right vocal fold lesion.  PROCEDURE:  Suspended Microdirect laryngoscopy with biopsy.  SURGEON:  Onnie Graham, MD.  ANESTHESIA:  General endotracheal anesthesia.  COMPLICATIONS:  None.  INDICATION:  The patient is a 66 year old male, who was recently treated for lung cancer and has had several months of hoarseness.  He was found to have an irregular right vocal fold and presents to the operating room for surgical biopsy.  FINDINGS:  There was irregular mucosa covering the entire right vocal fold, but not involving the left.  It did not extend into the subglottis.  There was involvement of similar mucosa on the right false vocal fold as well.  The rest of the hypopharynx, larynx, and vallecula were normal including the epiglottis.  DESCRIPTION OF PROCEDURE:  The patient was identified in the holding room.  Informed consent having been obtained including discussion of risks, benefits, alternatives, the patient was brought to the operative suite and prepped and draped in supine position.  Anesthesia was induced.  The patient was intubated by the anesthesia team without difficulty.  Both eyes were taped closed and bed was turned 90 degrees from Anesthesia.  The patient was given intravenous steroids during the case.  A damp gauze was placed over the upper gum and a Storz laryngoscope was then placed in a supraglottic position and suspended on the Mayo stand using a Lewy arm.  A 0-degree telescope was used to make a preoperative photograph of the larynx.  An  epinephrine-soaked pledget was held against the right side of the larynx for a couple minutes and then removed.  Cup forceps were then used to take multiple biopsies from the right true vocal fold and then the right false vocal fold and these specimens were sent separately for pathology.  After this was completed, the epinephrine pledget was held again against the larynx.  It was then removed and the airway further evaluated with a 0-degree telescope past the glottis.  The laryngoscope was taken out of suspension and gradually removed while also examining the hypopharynx, pyriform sinuses, and vallecula.  The airway was suctioned on the way out.  Laryngoscope was fully removed and the gauze was removed as well.  He was turned back to Anesthesia for wake-up, was extubated, and moved to the recovery room in stable condition.     Onnie Graham, MD     DDB/MEDQ  D:  06/28/2017  T:  06/28/2017  Job:  [id number]

## 2017-06-28 NOTE — H&P (Signed)
[name] is an 66 y.o. male.   Chief Complaint: Dysphonia, vocal fold lesion HPI: 66 year old male recently treated for lung cancer has had a hoarse voice for the past couple of months.  He was found to have an abnormal right vocal fold and presents for biopsy.  Past Medical History:  Diagnosis Date  . Allergy   . Anemia   . Anginal pain ([place])   . Arthritis    "hands"  . Asthma   . Back pain    4 deteriorating disc and receives an injection q3-34mon;has been doing this for about 78yrs  . Blood transfusion    as a child  . Bronchitis   . Cancer ([place]) dx'd 2013   Non-small cell lung cancer  . Chronic kidney disease    acute kidney failure post surgery  . COPD (chronic obstructive pulmonary disease) (HCC)    uses Albuterol and Spiriva daily  . Coronary artery disease    has 1 stent  . Depression    takes Prozac daily  . Emphysema    sees Dr.Ramaswami for this  . Family history of adverse reaction to anesthesia    mother was hard to wake up sometimes  . GERD (gastroesophageal reflux disease)    takes Prilosec daily, EGD nml 06/2017  . Glaucoma    hx of  . H/O hiatal hernia   . Headache(784.0)   . Hypertension    takes  HYzaar daily  . Insomnia    takes Trazodone nightly  . Lung mass    right upper lobe  . Myocardial infarct ([place])   . 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 Obesity   . 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 Periodic limb movements of sleep   . Peripheral neuropathy   . Pneumonia    hx of' 66 yo,rd' last time in 2012  . Productive cough    white in color but no odor  . Shortness of breath    with exertion   . Sleep apnea    uses BiPaP; "no longer have sleep apnea since I have lost over 100 lbs"  . Syncope and collapse 05/26/2013  . Thyroid disease   . Type II diabetes mellitus (HCC)    takes Metformin bid and Novolog and Lantus daily    Past Surgical History:  Procedure Laterality Date  . CARDIAC CATHETERIZATION  2006/2008/2009  .  CARPAL TUNNEL RELEASE     bilateral  . COLONOSCOPY    . CORONARY ANGIOPLASTY WITH STENT PLACEMENT     1 stent  . ELBOW SURGERY     ulnar nerve; left  . ESOPHAGOGASTRODUODENOSCOPY    . EYE SURGERY     pt. denies  . FUDUCIAL PLACEMENT Left 08/24/2016   Procedure: PLACEMENT OF FUDUCIAL;  Surgeon: Melrose Nakayama, MD;  Location: [facility];  Service: Thoracic;  Laterality: Left;  . INGUINAL HERNIA REPAIR  1990's   double inguinal  . LOBECTOMY  03/17/2012   upper right side with resection  . REFRACTIVE SURGERY     bilaterally  . ROTATOR CUFF REPAIR     left  . VIDEO BRONCHOSCOPY WITH ENDOBRONCHIAL NAVIGATION N/A 08/24/2016   Procedure: VIDEO BRONCHOSCOPY WITH ENDOBRONCHIAL NAVIGATION;  Surgeon: Melrose Nakayama, MD;  Location: [facility];  Service: Thoracic;  Laterality: N/A;    Family History  Problem Relation Age of Onset  . COPD Mother   . Diabetes Mother   . Hypertension Mother   .  Heart attack Father   . Diabetes Father   . Anesthesia problems Neg Hx   . Hypotension Neg Hx   . Malignant hyperthermia Neg Hx   . Pseudochol deficiency Neg Hx    Social History:  reports that he has been smoking Cigarettes.  He has a 90.00 pack-year smoking history. He has never used smokeless tobacco. He reports that he uses drugs, including Cocaine. He reports that he does not drink alcohol.  Allergies:  Allergies  Allergen Reactions  . Actos [Pioglitazone] Swelling    EDEMA REACTION UNSPECIFIED  . Avandia [Rosiglitazone] Swelling    SWELLING REACTION UNSPECIFIED   . Dilaudid [Hydromorphone Hcl] Other (See Comments)    Made him crazy    Medications Prior to Admission  Medication Sig Dispense Refill  . acetaminophen (TYLENOL) 500 MG tablet Take 1,000 mg by mouth every 6 (six) hours as needed for moderate pain.    Marland Kitchen aspirin EC 81 MG tablet Take 81 mg by mouth daily.    Marland Kitchen atorvastatin (LIPITOR) 40 MG tablet Take 40 mg by mouth daily.    . calcium carbonate (OS-CAL) 1250 (500 Ca) MG  chewable tablet Chew 1 tablet by mouth 3 (three) times daily after meals.    Marland Kitchen diltiazem (CARDIZEM CD) 120 MG 24 hr capsule Take 1 capsule (120 mg total) by mouth daily. 90 capsule 3  . diphenhydramine-acetaminophen (TYLENOL PM) 25-500 MG TABS tablet Take 1 tablet by mouth at bedtime as needed (sleep).     . Docosanol (ABREVA EX) Apply 1 application topically daily as needed (cold sores).    Marland Kitchen FLUoxetine (PROZAC) 40 MG capsule TAKE ONE CAPSULE BY MOUTH EVERY DAY 90 capsule 1  . fluticasone (FLONASE) 50 MCG/ACT nasal spray Place 2 sprays into both nostrils daily.  5  . gabapentin (NEURONTIN) 300 MG capsule TAKE 1 CAPSULE BY MOUTH THREE TIMES DAILY 270 capsule 1  . ipratropium-albuterol (DUONEB) 0.5-2.5 (3) MG/3ML SOLN USE 1 VIAL IN NEBULIZER 4 TIMES A DAY (Patient taking differently: Inhale 1 vial every 6 hours as needed for shortness of breath) 360 mL 5  . LANTUS 100 UNIT/ML injection Inject 12 Units into the skin every morning.   3  . losartan (COZAAR) 25 MG tablet Take 25 mg by mouth daily.    . Multiple Vitamin (MULTIVITAMIN WITH MINERALS) TABS Take 1 tablet by mouth daily.    . ranitidine (ZANTAC) 150 MG capsule Take 1 capsule (150 mg total) by mouth 2 (two) times daily. (Patient taking differently: Take 150 mg by mouth daily. ) 60 capsule 11  . topiramate (TOPAMAX) 50 MG tablet Take 1 tablet (50 mg total) by mouth 2 (two) times daily. 360 tablet 0  . traZODone (DESYREL) 100 MG tablet TAKE ONE TABLET BY MOUTH EVERY NIGHT AT BEDTIME 90 tablet 1  . cyclobenzaprine (FLEXERIL) 10 MG tablet Take 10 mg by mouth 3 (three) times daily.    Marland Kitchen levofloxacin (LEVAQUIN) 750 MG tablet Take 1 tablet (750 mg total) by mouth daily. (Patient not taking: Reported on 06/20/2017) 10 tablet 0  . nitroGLYCERIN (NITROSTAT) 0.4 MG SL tablet Place 0.4 mg under the tongue every 5 (five) minutes as needed for chest pain.      Results for orders placed or performed during the hospital encounter of 06/28/17 (from the past 48  hour(s))  Glucose, capillary     Status: Abnormal   Collection Time: 06/28/17  9:02 AM  Result Value Ref Range   Glucose-Capillary 126 (H) 65 - 99 mg/dL  Comment 1 Notify RN    Comment 2 Document in Chart    No results found.  Review of Systems  All other systems reviewed and are negative.   Blood pressure (!) 108/57, pulse 75, temperature 97.8 F (36.6 C), temperature source Oral, resp. rate 20, SpO2 98 %. Physical Exam  Constitutional: He is oriented to person, place, and time. He appears well-developed and well-nourished. No distress.  HENT:  Head: Normocephalic and atraumatic.  Right Ear: External ear normal.  Left Ear: External ear normal.  Nose: Nose normal.  Mouth/Throat: Oropharynx is clear and moist.  Scratchy dysphonia.  Eyes: Pupils are equal, round, and reactive to light. Conjunctivae and EOM are normal.  Neck: Normal range of motion. Neck supple.  Cardiovascular: Normal rate.   Respiratory: Effort normal.  Musculoskeletal: Normal range of motion.  Neurological: He is alert and oriented to person, place, and time. No cranial nerve deficit.  Skin: Skin is warm and dry.  Psychiatric: He has a normal mood and affect. His behavior is normal. Judgment and thought content normal.     Assessment/Plan Right vocal fold lesion, dysphonia To OR for SMDL with biopsy.  Melida Quitter, MD 06/28/2017, 10:45 AM

## 2017-06-28 NOTE — Anesthesia Preprocedure Evaluation (Signed)
Anesthesia Evaluation  Patient identified by MRN, date of birth, ID band Patient awake    Reviewed: Allergy & Precautions, H&P , NPO status , Patient's Chart, lab work & pertinent test results  Airway Mallampati: II   Neck ROM: full    Dental   Pulmonary shortness of breath, asthma , sleep apnea , COPD, Current Smoker,    breath sounds clear to auscultation       Cardiovascular hypertension, + angina + CAD and + Past MI   Rhythm:regular Rate:Normal     Neuro/Psych  Headaches, Seizures -,  PSYCHIATRIC DISORDERS Depression  Neuromuscular disease    GI/Hepatic GERD  ,  Endo/Other  diabetes, Type 2  Renal/GU Renal InsufficiencyRenal disease     Musculoskeletal  (+) Arthritis ,   Abdominal   Peds  Hematology   Anesthesia Other Findings   Reproductive/Obstetrics                             Anesthesia Physical Anesthesia Plan  ASA: III  Anesthesia Plan: General   Post-op Pain Management:    Induction: Intravenous  PONV Risk Score and Plan: 1 and Ondansetron, Dexamethasone, Propofol infusion and Treatment may vary due to age or medical condition  Airway Management Planned: Natural Airway  Additional Equipment:   Intra-op Plan:   Post-operative Plan:   Informed Consent: I have reviewed the patients History and Physical, chart, labs and discussed the procedure including the risks, benefits and alternatives for the proposed anesthesia with the patient or authorized representative who has indicated his/her understanding and acceptance.     Plan Discussed with: CRNA, Anesthesiologist and Surgeon  Anesthesia Plan Comments: (Jet ventilation planned.)        Anesthesia Quick Evaluation

## 2017-06-28 NOTE — Transfer of Care (Signed)
Immediate Anesthesia Transfer of Care Note  Patient: [name]  Procedure(s) Performed: Procedure(s): MICROLARYNGOSCOPY WITH BIOPSY (N/A)  Patient Location: PACU  Anesthesia Type:General  Level of Consciousness: awake, alert  and oriented  Airway & Oxygen Therapy: Patient Spontanous Breathing  Post-op Assessment: Post -op Vital signs reviewed and stable  Post vital signs: stable  Last Vitals:  Vitals:   06/28/17 0847  BP: (!) 108/57  Pulse: 75  Resp: 20  Temp: 36.6 C  SpO2: 98%    Last Pain:  Vitals:   06/28/17 0847  TempSrc: Oral      Patients Stated Pain Goal: 3 (72/82/06 0156)  Complications: No apparent anesthesia complications

## 2017-06-28 NOTE — Brief Op Note (Signed)
06/28/2017  11:42 AM  PATIENT:  [name]  66 y.o. male  PRE-OPERATIVE DIAGNOSIS:  VOCAL CORD LESION  POST-OPERATIVE DIAGNOSIS:  same  PROCEDURE:  Procedure(s): MICROLARYNGOSCOPY WITH BIOPSY (N/A)  SURGEON:  Surgeon(s) and Role:    Melida Quitter, MD - Primary  PHYSICIAN ASSISTANT:   ASSISTANTS: none   ANESTHESIA:   general  EBL:  No intake/output data recorded.  BLOOD ADMINISTERED:none  DRAINS: none   LOCAL MEDICATIONS USED:  NONE  SPECIMEN:  Source of Specimen:  Right true vocal fold, right false vocal fold  DISPOSITION OF SPECIMEN:  PATHOLOGY  COUNTS:  YES  TOURNIQUET:  * No tourniquets in log *  DICTATION: .Other Dictation: Dictation Number [id number]  PLAN OF CARE: Discharge to home after PACU  PATIENT DISPOSITION:  PACU - hemodynamically stable.   Delay start of Pharmacological VTE agent (>24hrs) due to surgical blood loss or risk of bleeding: no

## 2017-07-01 ENCOUNTER — Encounter (HOSPITAL_COMMUNITY): Payer: Self-pay | Admitting: Otolaryngology

## 2017-07-01 NOTE — Anesthesia Postprocedure Evaluation (Signed)
Anesthesia Post Note  Patient: [name]  Procedure(s) Performed: Procedure(s) (LRB): MICROLARYNGOSCOPY WITH BIOPSY (N/A)     Patient location during evaluation: PACU Anesthesia Type: General Level of consciousness: awake and alert Pain management: pain level controlled Vital Signs Assessment: post-procedure vital signs reviewed and stable Respiratory status: spontaneous breathing, nonlabored ventilation, respiratory function stable and patient connected to nasal cannula oxygen Cardiovascular status: blood pressure returned to baseline and stable Postop Assessment: no signs of nausea or vomiting Anesthetic complications: no    Last Vitals:  Vitals:   06/28/17 1230 06/28/17 1240  BP: (!) 97/56 (!) 101/50  Pulse: 65 66  Resp: 18 13  Temp: 36.4 C   SpO2: 98% 95%    Last Pain:  Vitals:   06/28/17 0847  TempSrc: Oral                 Dot Splinter S

## 2017-07-03 ENCOUNTER — Telehealth: Payer: Self-pay | Admitting: Radiation Oncology

## 2017-07-03 NOTE — Telephone Encounter (Signed)
I attempted to call the patient but both numbers listed are not correct.

## 2017-07-04 ENCOUNTER — Encounter: Payer: Self-pay | Admitting: Internal Medicine

## 2017-07-04 ENCOUNTER — Ambulatory Visit (INDEPENDENT_AMBULATORY_CARE_PROVIDER_SITE_OTHER): Payer: PPO | Admitting: Internal Medicine

## 2017-07-04 VITALS — BP 108/62 | HR 80 | Ht 71.0 in | Wt 182.6 lb

## 2017-07-04 DIAGNOSIS — F17200 Nicotine dependence, unspecified, uncomplicated: Secondary | ICD-10-CM

## 2017-07-04 DIAGNOSIS — C3411 Malignant neoplasm of upper lobe, right bronchus or lung: Secondary | ICD-10-CM | POA: Diagnosis not present

## 2017-07-04 DIAGNOSIS — J329 Chronic sinusitis, unspecified: Secondary | ICD-10-CM | POA: Diagnosis not present

## 2017-07-04 DIAGNOSIS — J449 Chronic obstructive pulmonary disease, unspecified: Secondary | ICD-10-CM | POA: Diagnosis not present

## 2017-07-04 MED ORDER — DOXYCYCLINE HYCLATE 100 MG PO TABS: 100.0000 mg | ORAL_TABLET | Freq: Two times a day (BID) | ORAL | 0 refills | Status: DC

## 2017-07-04 MED ORDER — PREDNISONE 10 MG PO TABS: ORAL_TABLET | ORAL | 0 refills | Status: DC

## 2017-07-04 NOTE — Patient Instructions (Addendum)
ICD-10-CM   1. Chronic sinusitis, unspecified location J32.9   2. Chronic obstructive pulmonary disease, unspecified COPD type ([place]) J44.9   3. Non-small cell carcinoma of lung, stage 1 (HCC) C34.11   4. Smoking F17.200      Chronic sinusitis, unspecified location  Take doxycycline 100mg  po twice daily x 8 days; take after meals and avoid sunlight Take prednisone 40 mg daily x 2 days, then 20mg  daily x 2 days, then 10mg  daily x 2 days, then 5mg  daily x 2 days and stop Do CT sinus without contrast Followup with Dr Redmond Baseman for vocal cord issues and further sinus issues if they persists   Chronic obstructive pulmonary disease, unspecified COPD type ([place]) - continue inhalers as before - Please talk to PCP [name], MD -  and ensure you get  shingarix vaccine -high dose flu shot when antibiotic/prednisone complete  Non-small cell carcinoma of lung, stage 1 ([place])  - in remission on Scan July 2018  Smoking - try to quit  Followup 4 months or sooner if needed

## 2017-07-04 NOTE — Progress Notes (Signed)
Subjective:     Patient ID: [name], male   DOB: 1951/08/18, 66 y.o.   MRN: [id number]  HPI Chief Complaint  Patient presents with  . Follow-up    COPD     Referring provider: [name], MD  HPI:66 year old male smoker followed for COPD and previous lung cancer in May 2013, status post lobectomy and LLL stage 1 squamous cell cancer in 2017 s/p XRT .   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 Mediastinal nodes Stable Oct 2009 - July 2011; no further fu  4.RUL pulmonary nodule with stage IA non-small cell lung cancer - May 2013  - - May 2013  - 1.9cm T1a, N0, M0  Stage 1A NSCLC with 0.3cm carcinoid - s/p lobectomy - December 2013 CT chest: No recurrence - July 2014 CT chest - no recurrence. RML nodule stabvle since Dec 2013 - JAn 2016 - no recurrence   02/06/2017 Follow up : COPD/Lung cancer hx /PFT  Patient presents for a one-month follow-up. She was seen last visit with a COPD exacerbation. He was treated with antibiotics and steroids. Patient is feeling better with decreased cough, congestion. Patient had a PFT on 01/31/2017 that showed an FEV1 at 87%, ratio 64, FVC 102%, DLCO 56%. This is improved since 2013 (59% in 2013 -pre lobectomy)  Using stiolto every other day.  Hx of lung cancer , Has upcoming CT chest i   OV  07/04/2017  Chief Complaint  Patient presents with  . Follow-up    Pt c/o hoarseness x months. Pt saw Dr. Redmond Baseman and had a biopsy of vocal cord. Pt c/o prod cough with white mucus and sinus congestion. Pt states his SOB has slightly worsened as well.     Follow-up multiple. He presents with his wife  COPD: He is on inhaler therapy. He needs his flu shot but he has sinus  issues going on. He used to be obese but he has lost weight intentionally. But now he is unintentional ongoing weight loss  Smoking: He continues to smoke. He is unable to quit  Lung cancer: July 2018 he had CT scan of the chest that shows it is in complete remission I review the results but did not visualized image  New issue: For the last 2 months he said hoarseness of voice. He went and saw Dr. Redmond Baseman of ENT. Wife showed me the pictures of his ENT exam. 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 He is on observation therapy versus repeat biopsy.  New issue: Is complaining of 2 months of significant sinus congestion with clear and green drainage. Couple months ago he did try Levaquin without relief. There is no fever but he says that the sinus congestion is making for him      has a past medical history of Allergy; Anemia; Anginal pain (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   reports that he has been smoking Cigarettes.  He has a 90.00 pack-year smoking history. He has never used smokeless tobacco.  Past Surgical History:  Procedure Laterality Date  . CARDIAC CATHETERIZATION  2006/2008/2009  . CARPAL TUNNEL RELEASE     bilateral  . COLONOSCOPY    . CORONARY ANGIOPLASTY WITH STENT PLACEMENT      1 stent  . ELBOW SURGERY     ulnar nerve; left  . ESOPHAGOGASTRODUODENOSCOPY    . EYE SURGERY     pt. denies  . FUDUCIAL PLACEMENT Left 08/24/2016   Procedure: PLACEMENT OF FUDUCIAL;  Surgeon: Melrose Nakayama, MD;  Location: [facility];  Service: Thoracic;  Laterality: Left;  . INGUINAL HERNIA REPAIR  1990's   double inguinal  . LOBECTOMY  03/17/2012   upper right side with resection  . MICROLARYNGOSCOPY WITH CO2 LASER AND EXCISION OF VOCAL CORD LESION N/A 06/28/2017   Procedure: MICROLARYNGOSCOPY WITH BIOPSY;  Surgeon: Melida Quitter, MD;  Location: [facility];  Service: ENT;  Laterality: N/A;  . REFRACTIVE SURGERY     bilaterally  . ROTATOR CUFF REPAIR     left  . VIDEO BRONCHOSCOPY WITH ENDOBRONCHIAL NAVIGATION N/A 08/24/2016   Procedure: VIDEO BRONCHOSCOPY WITH ENDOBRONCHIAL NAVIGATION;  Surgeon: Melrose Nakayama, MD;  Location: [facility];  Service: Thoracic;  Laterality: N/A;    Allergies  Allergen Reactions  . Actos [Pioglitazone] Swelling    EDEMA REACTION UNSPECIFIED  . Avandia [Rosiglitazone] Swelling    SWELLING REACTION UNSPECIFIED   . Dilaudid [Hydromorphone Hcl] Other (See Comments)    Made him crazy    Immunization History  Administered Date(s) Administered  . Influenza Split 08/21/2012, 08/25/2013  . Influenza Whole 06/06/2009, 07/07/2011  . Influenza,inj,Quad PF,6+ Mos 10/25/2014, 08/17/2016  . Influenza-Unspecified 08/09/2010  . Pneumococcal Conjugate-13 10/02/2013  . Pneumococcal Polysaccharide-23 11/11/2008  . Tdap 09/09/2012  . Zoster 09/09/2012    Family History  Problem Relation Age of Onset  . COPD Mother   . Diabetes Mother   . Hypertension Mother   . Heart attack Father   . Diabetes Father   . Anesthesia problems Neg Hx   . Hypotension Neg Hx   . Malignant hyperthermia Neg Hx   . Pseudochol deficiency Neg Hx      Current Outpatient Prescriptions:  .  acetaminophen (TYLENOL) 500 MG tablet, Take 1,000 mg by mouth every 6 (six) hours as needed  for moderate pain., Disp: , Rfl:  .  aspirin EC 81 MG tablet, Take 81 mg by mouth daily., Disp: , Rfl:  .  atorvastatin (LIPITOR) 40 MG tablet, Take 40 mg by mouth daily., Disp: , Rfl:  .  diltiazem (CARDIZEM CD) 120 MG 24 hr capsule, Take 1 capsule (120 mg total) by mouth daily., Disp: 90 capsule, Rfl: 3 .  diphenhydramine-acetaminophen (TYLENOL PM) 25-500 MG TABS tablet, Take 1 tablet by mouth at bedtime as needed (sleep). , Disp: , Rfl:  .  Docosanol (ABREVA EX), Apply 1 application topically daily as needed (cold sores)., Disp: , Rfl:  .  FLUoxetine (PROZAC) 40 MG capsule, TAKE ONE CAPSULE BY MOUTH EVERY DAY, Disp: 90 capsule, Rfl: 1 .  fluticasone (FLONASE) 50 MCG/ACT nasal spray, Place 2 sprays into both nostrils daily., Disp: , Rfl: 5 .  gabapentin (NEURONTIN) 300 MG capsule, TAKE  1 CAPSULE BY MOUTH THREE TIMES DAILY, Disp: 270 capsule, Rfl: 1 .  ipratropium-albuterol (DUONEB) 0.5-2.5 (3) MG/3ML SOLN, USE 1 VIAL IN NEBULIZER 4 TIMES A DAY (Patient taking differently: Inhale 1 vial every 6 hours as needed for shortness of breath), Disp: 360 mL, Rfl: 5 .  LANTUS 100 UNIT/ML injection, Inject 12 Units into the skin every morning. , Disp: , Rfl: 3 .  losartan (COZAAR) 25 MG tablet, Take 25 mg by mouth daily., Disp: , Rfl:  .  Multiple Vitamin (MULTIVITAMIN WITH MINERALS) TABS, Take 1 tablet by mouth daily., Disp: , Rfl:  .  nitroGLYCERIN (NITROSTAT) 0.4 MG SL tablet, Place 0.4 mg under the tongue every 5 (five) minutes as needed for chest pain., Disp: , Rfl:  .  ranitidine (ZANTAC) 150 MG capsule, Take 1 capsule (150 mg total) by mouth 2 (two) times daily. (Patient taking differently: Take 150 mg by mouth daily. ), Disp: 60 capsule, Rfl: 11 .  topiramate (TOPAMAX) 50 MG tablet, Take 1 tablet (50 mg total) by mouth 2 (two) times daily., Disp: 360 tablet, Rfl: 0 .  traZODone (DESYREL) 100 MG tablet, TAKE ONE TABLET BY MOUTH EVERY NIGHT AT BEDTIME, Disp: 90 tablet, Rfl: 1   Review of Systems      Objective:   Physical Exam  Constitutional: He is oriented to person, place, and time. He appears well-developed and well-nourished. No distress.  HENT:  Head: Normocephalic and atraumatic.  Right Ear: External ear normal.  Left Ear: External ear normal.  Mouth/Throat: Oropharynx is clear and moist. No oropharyngeal exudate.  Hoarse voice - new Smells of tobacco  nasallly congested  Eyes: Pupils are equal, round, and reactive to light. Conjunctivae and EOM are normal. Right eye exhibits no discharge. Left eye exhibits no discharge. No scleral icterus.  Neck: Normal range of motion. Neck supple. No JVD present. No tracheal deviation present. No thyromegaly present.  Cardiovascular: Normal rate, regular rhythm and intact distal pulses.  Exam reveals no gallop and no friction rub.   No murmur heard. Pulmonary/Chest: Effort normal and breath sounds normal. No respiratory distress. He has no wheezes. He has no rales. He exhibits no tenderness.  Abdominal: Soft. Bowel sounds are normal. He exhibits no distension and no mass. There is no tenderness. There is no rebound and no guarding.  Musculoskeletal: Normal range of motion. He exhibits no edema or tenderness.  Lymphadenopathy:    He has no cervical adenopathy.  Neurological: He is alert and oriented to person, place, and time. He has normal reflexes. No cranial nerve deficit. Coordination normal.  Skin: Skin is warm and dry. No rash noted. He is not diaphoretic. No erythema. No pallor.  Psychiatric: He has a normal mood and affect. His behavior is normal. Judgment and thought content normal.  Nursing note and vitals reviewed.  Vitals:   07/04/17 0953  BP: 108/62  Pulse: 80  SpO2: 97%  Weight: 182 lb 9.6 oz (82.8 kg)  Height: 5\' 11"  (1.803 m)    Estimated body mass index is 25.47 kg/m as calculated from the following:   Height as of this encounter: 5\' 11"  (1.803 m).   Weight as of this encounter: 182 lb 9.6 oz (82.8 kg).      Assessment:       ICD-10-CM   1. Chronic sinusitis, unspecified location J32.9   2. Chronic obstructive pulmonary disease, unspecified COPD type ([place]) J44.9   3. Non-small cell carcinoma of lung, stage 1 (HCC) C34.11   4.  Smoking F17.200        Plan:       Chronic sinusitis, unspecified location  Take doxycycline 100mg  po twice daily x 8 days; take after meals and avoid sunlight Take prednisone 40 mg daily x 2 days, then 20mg  daily x 2 days, then 10mg  daily x 2 days, then 5mg  daily x 2 days and stop Do CT sinus without contrast Followup with Dr Redmond Baseman for vocal cord issues and further sinus issues if they persists   Chronic obstructive pulmonary disease, unspecified COPD type ([place]) - continue inhalers as before - Please talk to PCP [name], MD -  and ensure you get  shingarix vaccine -high dose flu shot when antibiotic/prednisone complete  Non-small cell carcinoma of lung, stage 1 ([place])  - in remission on Scan July 2018  Smoking - try to quit  Followup 4 months or sooner if needed     Dr. Brand Males, M.D., [facility].C.P Pulmonary and Critical Care Medicine Staff Physician [facility] Pager: [phone], If no answer or between  15:00h - 7:00h: call [phone]  07/04/2017 10:26 AM

## 2017-07-10 ENCOUNTER — Ambulatory Visit (INDEPENDENT_AMBULATORY_CARE_PROVIDER_SITE_OTHER)
Admission: RE | Admit: 2017-07-10 | Discharge: 2017-07-10 | Disposition: A | Discharge status: Home / Self Care | Payer: PPO | Source: Ambulatory Visit | Attending: Internal Medicine | Admitting: Internal Medicine

## 2017-07-10 DIAGNOSIS — J329 Chronic sinusitis, unspecified: Secondary | ICD-10-CM | POA: Diagnosis not present

## 2017-07-12 ENCOUNTER — Ambulatory Visit: Payer: PPO | Admitting: Podiatry

## 2017-07-15 DIAGNOSIS — J383 Other diseases of vocal cords: Secondary | ICD-10-CM | POA: Diagnosis not present

## 2017-07-15 DIAGNOSIS — R490 Dysphonia: Secondary | ICD-10-CM | POA: Diagnosis not present

## 2017-07-18 ENCOUNTER — Other Ambulatory Visit: Payer: Self-pay | Admitting: Otolaryngology

## 2017-07-18 ENCOUNTER — Inpatient Hospital Stay: Admission: RE | Admit: 2017-07-18 | Payer: PPO | Source: Ambulatory Visit

## 2017-07-18 ENCOUNTER — Encounter (HOSPITAL_COMMUNITY): Payer: Self-pay | Admitting: *Deleted

## 2017-07-18 NOTE — Progress Notes (Addendum)
I spoke with Mrs Spark, patient has given permission for [place] to talk to her.  Mrs Laraia reports tha CBG runs 110- 125.  Dr Buddy Duty is patients endocrinologist.  I instructed patient to check CBG after awaking and every 2 hours until arrival  to the hospital.  I Instructed patient if CBG is less than 70 to take  1/2 cup of a clear juice. Recheck CBG in 15 minutes then call pre- op desk at [phone] for further instructions. If scheduled to receive Insulin, do not take Insulin. If CBG is greater than 70 take 1/2 of Lantus dose- 6 units.

## 2017-07-19 ENCOUNTER — Ambulatory Visit (HOSPITAL_COMMUNITY): Payer: PPO | Admitting: Certified Registered Nurse Anesthetist

## 2017-07-19 ENCOUNTER — Ambulatory Visit (HOSPITAL_COMMUNITY)
Admission: RE | Admit: 2017-07-19 | Discharge: 2017-07-19 | Disposition: A | Discharge status: Home / Self Care | Payer: PPO | Source: Ambulatory Visit | Attending: Otolaryngology | Admitting: Otolaryngology

## 2017-07-19 ENCOUNTER — Encounter (HOSPITAL_COMMUNITY)
Admission: RE | Disposition: A | Discharge status: Home / Self Care | Payer: Self-pay | Source: Ambulatory Visit | Attending: Otolaryngology

## 2017-07-19 ENCOUNTER — Encounter (HOSPITAL_COMMUNITY): Payer: Self-pay | Admitting: *Deleted

## 2017-07-19 DIAGNOSIS — I10 Essential (primary) hypertension: Secondary | ICD-10-CM | POA: Diagnosis not present

## 2017-07-19 DIAGNOSIS — J387 Other diseases of larynx: Secondary | ICD-10-CM | POA: Diagnosis present

## 2017-07-19 DIAGNOSIS — I252 Old myocardial infarction: Secondary | ICD-10-CM | POA: Diagnosis not present

## 2017-07-19 DIAGNOSIS — G4700 Insomnia, unspecified: Secondary | ICD-10-CM | POA: Diagnosis not present

## 2017-07-19 DIAGNOSIS — Z85118 Personal history of other malignant neoplasm of bronchus and lung: Secondary | ICD-10-CM | POA: Insufficient documentation

## 2017-07-19 DIAGNOSIS — E1142 Type 2 diabetes mellitus with diabetic polyneuropathy: Secondary | ICD-10-CM | POA: Insufficient documentation

## 2017-07-19 DIAGNOSIS — Z7982 Long term (current) use of aspirin: Secondary | ICD-10-CM | POA: Diagnosis not present

## 2017-07-19 DIAGNOSIS — I2510 Atherosclerotic heart disease of native coronary artery without angina pectoris: Secondary | ICD-10-CM | POA: Diagnosis not present

## 2017-07-19 DIAGNOSIS — Z794 Long term (current) use of insulin: Secondary | ICD-10-CM | POA: Insufficient documentation

## 2017-07-19 DIAGNOSIS — J439 Emphysema, unspecified: Secondary | ICD-10-CM | POA: Insufficient documentation

## 2017-07-19 DIAGNOSIS — K219 Gastro-esophageal reflux disease without esophagitis: Secondary | ICD-10-CM | POA: Insufficient documentation

## 2017-07-19 DIAGNOSIS — Z79899 Other long term (current) drug therapy: Secondary | ICD-10-CM | POA: Diagnosis not present

## 2017-07-19 DIAGNOSIS — Z6825 Body mass index (BMI) 25.0-25.9, adult: Secondary | ICD-10-CM | POA: Diagnosis not present

## 2017-07-19 DIAGNOSIS — F329 Major depressive disorder, single episode, unspecified: Secondary | ICD-10-CM | POA: Insufficient documentation

## 2017-07-19 DIAGNOSIS — J383 Other diseases of vocal cords: Secondary | ICD-10-CM | POA: Diagnosis not present

## 2017-07-19 DIAGNOSIS — C320 Malignant neoplasm of glottis: Secondary | ICD-10-CM | POA: Diagnosis not present

## 2017-07-19 DIAGNOSIS — E669 Obesity, unspecified: Secondary | ICD-10-CM | POA: Diagnosis not present

## 2017-07-19 DIAGNOSIS — Z7951 Long term (current) use of inhaled steroids: Secondary | ICD-10-CM | POA: Diagnosis not present

## 2017-07-19 DIAGNOSIS — I129 Hypertensive chronic kidney disease with stage 1 through stage 4 chronic kidney disease, or unspecified chronic kidney disease: Secondary | ICD-10-CM | POA: Diagnosis not present

## 2017-07-19 DIAGNOSIS — N189 Chronic kidney disease, unspecified: Secondary | ICD-10-CM | POA: Diagnosis not present

## 2017-07-19 DIAGNOSIS — F17210 Nicotine dependence, cigarettes, uncomplicated: Secondary | ICD-10-CM | POA: Diagnosis not present

## 2017-07-19 DIAGNOSIS — G4730 Sleep apnea, unspecified: Secondary | ICD-10-CM | POA: Insufficient documentation

## 2017-07-19 DIAGNOSIS — Z955 Presence of coronary angioplasty implant and graft: Secondary | ICD-10-CM | POA: Diagnosis not present

## 2017-07-19 HISTORY — PX: MICROLARYNGOSCOPY WITH CO2 LASER AND EXCISION OF VOCAL CORD LESION: SHX5970

## 2017-07-19 LAB — CBC
HCT: 36.9 % — ABNORMAL LOW (ref 39.0–52.0)
HEMOGLOBIN: 12.3 g/dL — AB (ref 13.0–17.0)
MCH: 31.2 pg (ref 26.0–34.0)
MCHC: 33.3 g/dL (ref 30.0–36.0)
MCV: 93.7 fL (ref 78.0–100.0)
PLATELETS: 175 10*3/uL (ref 150–400)
RBC: 3.94 MIL/uL — AB (ref 4.22–5.81)
RDW: 13.9 % (ref 11.5–15.5)
WBC: 9.2 10*3/uL (ref 4.0–10.5)

## 2017-07-19 LAB — GLUCOSE, CAPILLARY
GLUCOSE-CAPILLARY: 132 mg/dL — AB (ref 65–99)
Glucose-Capillary: 114 mg/dL — ABNORMAL HIGH (ref 65–99)

## 2017-07-19 LAB — BASIC METABOLIC PANEL
Anion gap: 7 (ref 5–15)
BUN: 19 mg/dL (ref 6–20)
CHLORIDE: 112 mmol/L — AB (ref 101–111)
CO2: 19 mmol/L — ABNORMAL LOW (ref 22–32)
CREATININE: 1.41 mg/dL — AB (ref 0.61–1.24)
Calcium: 8.8 mg/dL — ABNORMAL LOW (ref 8.9–10.3)
GFR, EST AFRICAN AMERICAN: 58 mL/min — AB (ref >60)
GFR, EST NON AFRICAN AMERICAN: 50 mL/min — AB (ref >60)
Glucose, Bld: 113 mg/dL — ABNORMAL HIGH (ref 65–99)
POTASSIUM: 4.4 mmol/L (ref 3.5–5.1)
SODIUM: 138 mmol/L (ref 135–145)

## 2017-07-19 SURGERY — MICROLARYNGOSCOPY WITH CO2 LASER AND EXCISION OF VOCAL CORD LESION
Anesthesia: General | Site: Throat

## 2017-07-19 MED ORDER — FENTANYL CITRATE (PF) 250 MCG/5ML IJ SOLN
INTRAMUSCULAR | Status: AC
  Filled 2017-07-19: qty 5

## 2017-07-19 MED ORDER — ONDANSETRON HCL 4 MG/2ML IJ SOLN
INTRAMUSCULAR | Status: AC
  Filled 2017-07-19: qty 2

## 2017-07-19 MED ORDER — DEXAMETHASONE SODIUM PHOSPHATE 10 MG/ML IJ SOLN
INTRAMUSCULAR | Status: AC
  Filled 2017-07-19: qty 1

## 2017-07-19 MED ORDER — DEXAMETHASONE SODIUM PHOSPHATE 10 MG/ML IJ SOLN
INTRAMUSCULAR | Status: DC | PRN
  Administered 2017-07-19: 10 mg via INTRAVENOUS

## 2017-07-19 MED ORDER — SUGAMMADEX SODIUM 200 MG/2ML IV SOLN
INTRAVENOUS | Status: DC | PRN
  Administered 2017-07-19: 200 mg via INTRAVENOUS

## 2017-07-19 MED ORDER — LIDOCAINE 2% (20 MG/ML) 5 ML SYRINGE
INTRAMUSCULAR | Status: DC | PRN
  Administered 2017-07-19: 100 mg via INTRAVENOUS

## 2017-07-19 MED ORDER — PROPOFOL 500 MG/50ML IV EMUL
INTRAVENOUS | Status: DC | PRN
  Administered 2017-07-19: 100 ug/kg/min via INTRAVENOUS

## 2017-07-19 MED ORDER — PROPOFOL 10 MG/ML IV BOLUS
INTRAVENOUS | Status: DC | PRN
  Administered 2017-07-19: 140 mg via INTRAVENOUS
  Administered 2017-07-19 (×2): 20 mg via INTRAVENOUS

## 2017-07-19 MED ORDER — MIDAZOLAM HCL 2 MG/2ML IJ SOLN
INTRAMUSCULAR | Status: DC | PRN
  Administered 2017-07-19 (×2): 1 mg via INTRAVENOUS

## 2017-07-19 MED ORDER — EPINEPHRINE HCL (NASAL) 0.1 % NA SOLN
NASAL | Status: AC
  Filled 2017-07-19: qty 30

## 2017-07-19 MED ORDER — FENTANYL CITRATE (PF) 100 MCG/2ML IJ SOLN
INTRAMUSCULAR | Status: DC | PRN
Start: 2017-07-19 — End: 2017-07-19
  Administered 2017-07-19 (×2): 25 ug via INTRAVENOUS
  Administered 2017-07-19: 50 ug via INTRAVENOUS

## 2017-07-19 MED ORDER — STERILE WATER FOR IRRIGATION IR SOLN
Status: DC | PRN
  Administered 2017-07-19: 400 mL

## 2017-07-19 MED ORDER — LACTATED RINGERS IV SOLN
INTRAVENOUS | Status: DC
  Administered 2017-07-19 (×2): via INTRAVENOUS

## 2017-07-19 MED ORDER — PROPOFOL 10 MG/ML IV BOLUS
INTRAVENOUS | Status: AC
  Filled 2017-07-19: qty 20

## 2017-07-19 MED ORDER — 0.9 % SODIUM CHLORIDE (POUR BTL) OPTIME
TOPICAL | Status: DC | PRN
  Administered 2017-07-19: 1000 mL

## 2017-07-19 MED ORDER — GLYCOPYRROLATE 0.2 MG/ML IJ SOLN
INTRAMUSCULAR | Status: DC | PRN
  Administered 2017-07-19: 0.2 mg via INTRAVENOUS

## 2017-07-19 MED ORDER — LABETALOL HCL 5 MG/ML IV SOLN
INTRAVENOUS | Status: DC | PRN
  Administered 2017-07-19: 5 mg via INTRAVENOUS

## 2017-07-19 MED ORDER — ROCURONIUM BROMIDE 100 MG/10ML IV SOLN
INTRAVENOUS | Status: DC | PRN
  Administered 2017-07-19: 20 mg via INTRAVENOUS
  Administered 2017-07-19: 40 mg via INTRAVENOUS

## 2017-07-19 MED ORDER — HYDROCODONE-ACETAMINOPHEN 7.5-325 MG/15ML PO SOLN: 10.0000 mL | ORAL | 0 refills | Status: DC | PRN

## 2017-07-19 MED ORDER — EPINEPHRINE HCL (NASAL) 0.1 % NA SOLN
NASAL | Status: DC | PRN
  Administered 2017-07-19: 30 mL via TOPICAL

## 2017-07-19 MED ORDER — MIDAZOLAM HCL 2 MG/2ML IJ SOLN
INTRAMUSCULAR | Status: AC
  Filled 2017-07-19: qty 2

## 2017-07-19 MED ORDER — ONDANSETRON HCL 4 MG/2ML IJ SOLN
INTRAMUSCULAR | Status: DC | PRN
  Administered 2017-07-19: 4 mg via INTRAVENOUS

## 2017-07-19 SURGICAL SUPPLY — 35 items
BALLN PULM 12 13.5 15X75 (BALLOONS)
BALLN PULM 12 13.5 15X75CM (BALLOONS)
BALLN PULMONARY 10 12MM (MISCELLANEOUS)
BALLN PULMONARY 10-12 (MISCELLANEOUS) IMPLANT
BALLOON PULM 12 13.5 15X75 (BALLOONS) IMPLANT
BANDAGE EYE OVAL (MISCELLANEOUS) ×6 IMPLANT
BLADE SURG 15 STRL LF DISP TIS (BLADE) IMPLANT
BLADE SURG 15 STRL SS (BLADE)
CANISTER SUCT 3000ML PPV (MISCELLANEOUS) ×3 IMPLANT
CONT SPEC 4OZ CLIKSEAL STRL BL (MISCELLANEOUS) ×4 IMPLANT
COVER BACK TABLE 60X90IN (DRAPES) ×3 IMPLANT
COVER MAYO STAND STRL (DRAPES) ×2 IMPLANT
CRADLE DONUT ADULT HEAD (MISCELLANEOUS) ×2 IMPLANT
DEPRESSOR TONGUE BLADE STERILE (MISCELLANEOUS) ×2 IMPLANT
DRAPE HALF SHEET 40X57 (DRAPES) ×3 IMPLANT
GAUZE SPONGE 4X4 12PLY STRL (GAUZE/BANDAGES/DRESSINGS) ×3 IMPLANT
GLOVE BIO SURGEON STRL SZ7.5 (GLOVE) ×3 IMPLANT
GOWN STRL REUS W/ TWL LRG LVL3 (GOWN DISPOSABLE) IMPLANT
GOWN STRL REUS W/TWL LRG LVL3 (GOWN DISPOSABLE)
KIT BASIN OR (CUSTOM PROCEDURE TRAY) ×3 IMPLANT
KIT ROOM TURNOVER OR (KITS) ×3 IMPLANT
NDL HYPO 25GX1X1/2 BEV (NEEDLE) IMPLANT
NEEDLE HYPO 25GX1X1/2 BEV (NEEDLE) IMPLANT
NS IRRIG 1000ML POUR BTL (IV SOLUTION) ×3 IMPLANT
PAD ARMBOARD 7.5X6 YLW CONV (MISCELLANEOUS) ×6 IMPLANT
PATTIES SURGICAL .5 X1 (DISPOSABLE) ×3 IMPLANT
PATTIES SURGICAL .5 X3 (DISPOSABLE) ×2 IMPLANT
SOLUTION ANTI FOG 6CC (MISCELLANEOUS) ×3 IMPLANT
SURGILUBE 2OZ TUBE FLIPTOP (MISCELLANEOUS) ×2 IMPLANT
SUT SILK 2 0 FS (SUTURE) IMPLANT
SYR 30ML SLIP (SYRINGE) ×2 IMPLANT
TOWEL NATURAL 6PK STERILE (DISPOSABLE) ×3 IMPLANT
TUBE CONNECTING 12'X1/4 (SUCTIONS) ×2
TUBE CONNECTING 12X1/4 (SUCTIONS) ×3 IMPLANT
WATER STERILE IRR 1000ML POUR (IV SOLUTION) ×3 IMPLANT

## 2017-07-19 NOTE — Anesthesia Postprocedure Evaluation (Signed)
Anesthesia Post Note  Patient: [name]  Procedure(s) Performed: Procedure(s) (LRB): MICROLARYNGOSCOPY WITH CO2 LASER AND EXCISION OF VOCAL CORD LESION (N/A)     Patient location during evaluation: PACU Anesthesia Type: General Level of consciousness: awake and alert, patient cooperative and oriented Pain management: pain level controlled Vital Signs Assessment: post-procedure vital signs reviewed and stable Respiratory status: spontaneous breathing, nonlabored ventilation, respiratory function stable and patient connected to nasal cannula oxygen Cardiovascular status: blood pressure returned to baseline and stable Postop Assessment: no apparent nausea or vomiting Anesthetic complications: no    Last Vitals:  Vitals:   07/19/17 1200 07/19/17 1215  BP: 138/64 136/65  Pulse: 73 71  Resp: 11 19  Temp: 36.6 C   SpO2: 94% 98%    Last Pain:  Vitals:   07/19/17 1215  TempSrc:   PainSc: 0-No pain                 Damarie Schoolfield,E. [name]

## 2017-07-19 NOTE — Anesthesia Preprocedure Evaluation (Addendum)
Anesthesia Evaluation  Patient identified by MRN, date of birth, ID band Patient awake    Reviewed: Allergy & Precautions, NPO status   Airway Mallampati: I  TM Distance: >3 FB Neck ROM: Full    Dental  (+) Edentulous Upper, Edentulous Lower   Pulmonary shortness of breath, asthma , COPD, Current Smoker,    Pulmonary exam normal        Cardiovascular hypertension, + Past MI  Normal cardiovascular exam     Neuro/Psych    GI/Hepatic hiatal hernia, GERD  ,  Endo/Other  diabetes  Renal/GU      Musculoskeletal  (+) Arthritis ,   Abdominal Normal abdominal exam  (+)   Peds  Hematology   Anesthesia Other Findings   Reproductive/Obstetrics                            Anesthesia Physical Anesthesia Plan  ASA: III  Anesthesia Plan: General   Post-op Pain Management:    Induction: Intravenous  PONV Risk Score and Plan: 2 and Ondansetron and Dexamethasone  Airway Management Planned: Oral ETT  Additional Equipment:   Intra-op Plan:   Post-operative Plan: Extubation in OR  Informed Consent: I have reviewed the patients History and Physical, chart, labs and discussed the procedure including the risks, benefits and alternatives for the proposed anesthesia with the patient or authorized representative who has indicated his/her understanding and acceptance.   Dental advisory given  Plan Discussed with: CRNA  Anesthesia Plan Comments:         Anesthesia Quick Evaluation

## 2017-07-19 NOTE — Transfer of Care (Signed)
Immediate Anesthesia Transfer of Care Note  Patient: [name]  Procedure(s) Performed: Procedure(s) with comments: MICROLARYNGOSCOPY WITH CO2 LASER AND EXCISION OF VOCAL CORD LESION (N/A) - MICRO DIRECT LARYNGOSCOPY WITH CO2 LASER VOCAL CORD STRIPPING/POSS JET VENTURI VENTILATION  Patient Location: PACU  Anesthesia Type:General  Level of Consciousness: awake, alert  and oriented  Airway & Oxygen Therapy: Patient Spontanous Breathing and Patient connected to nasal cannula oxygen  Post-op Assessment: Report given to RN, Post -op Vital signs reviewed and stable and Patient moving all extremities  Post vital signs: Reviewed and stable  Last Vitals:  Vitals:   07/19/17 0757 07/19/17 1127  BP: (!) 124/55 122/67  Pulse: 82 79  Resp: 19 13  Temp: 36.8 C 36.6 C  SpO2: 98% 100%    Last Pain:  Vitals:   07/19/17 0757  TempSrc: Oral      Patients Stated Pain Goal: 3 (41/42/39 5320)  Complications: No apparent anesthesia complications

## 2017-07-19 NOTE — H&P (Signed)
[name] is an 66 y.o. male.   Chief Complaint: Laryngeal dysplasia HPI: 66 year old male with hoarseness for a couple of months.  He was found, by biopsy, to have right-sided laryngeal dysplasia and presents for microlaryngoscopy with stripping.  Past Medical History:  Diagnosis Date  . Allergy   . Anemia   . Anginal pain ([place])   . Arthritis    "hands"  . Asthma   . Back pain    4 deteriorating disc and receives an injection q3-55mo;has been doing this for about 427yr . Blood transfusion    as a child  . Bronchitis   . Cancer ([place] 2013   Non-small cell lung cancer  . Chronic kidney disease    acute kidney failure post surgery  . COPD (chronic obstructive pulmonary disease) ([facility])    uses Albuterol and Spiriva daily  . Coronary artery disease    has 1 stent  . Depression    takes Prozac daily  . Emphysema    sees Dr.Ramaswami for this  . Family history of adverse reaction to anesthesia    mother was hard to wake up sometimes  . GERD (gastroesophageal reflux disease)    takes Prilosec daily, EGD nml 06/2017  . Glaucoma    hx of  . H/O hiatal hernia   . Hypertension    takes  HYzaar daily  . Insomnia    takes Trazodone nightly  . Lung mass    right upper lobe  . Myocardial infarct ([facility]  . 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 Obesity   . Parathyroid disease ([facility]  . Periodic limb movements of sleep   . Peripheral neuropathy   . Pneumonia    hx of' 66 yo,rd' last time in 2013  . Productive cough    white in color but no odor  . Shortness of breath    with exertion   . Sleep apnea    uses BiPaP; "no longer have sleep apnea since I have lost over 100 lbs"  . Syncope and collapse 05/26/2013  . Thyroid disease   . Type II diabetes mellitus ([facility])    takes Metformin bid and Novolog and Lantus daily    Past Surgical History:  Procedure Laterality Date  . CARDIAC CATHETERIZATION  2006/2008/2009  . CARPAL TUNNEL RELEASE     bilateral  .  COLONOSCOPY    . CORONARY ANGIOPLASTY WITH STENT PLACEMENT     1 stent  . ELBOW SURGERY     ulnar nerve; left  . ESOPHAGOGASTRODUODENOSCOPY    . EYE SURGERY     pt. denies  . FUDUCIAL PLACEMENT Left 08/24/2016   Procedure: PLACEMENT OF FUDUCIAL;  Surgeon: StMelrose NakayamaMD;  [place] Service: Thor[facility];  Laterality: Left;  . INGUINAL HERNIA REPAIR  1990's   double inguinal  . LOBECTOMY  03/17/2012   upper right side with resection  . MICROLARYNGOSCOPY WITH CO2 LASER AND EXCISION OF VOCAL CORD LESION N/A 06/28/2017   Procedure: MICROLARYNGOSCOPY WITH BIOPSY;  Surgeon: BaMelida QuitterMD;  [place] [facility] Service: E[facility];  Laterality: N/A;  . REFRACTIVE SURGERY     bilaterally  . ROTATOR CUFF REPAIR     left  . VIDEO BRONCHOSCOPY WITH ENDOBRONCHIAL NAVIGATION N/A 08/24/2016   Procedure: VIDEO BRONCHOSCOPY WITH ENDOBRONCHIAL NAVIGATION;  Surgeon: StMelrose NakayamaMD;  [place] [facility] Service: Thoracic;  Laterality: N/A;    Family History  Problem Relation Age of Onset  . COPD Mother   . Diabetes Mother   . Hypertension Mother   . Heart attack Father   . Diabetes Father   . Anesthesia problems Neg Hx   . Hypotension Neg Hx   . Malignant hyperthermia Neg Hx   . Pseudochol deficiency Neg Hx    Social History:  reports that he has been smoking Cigarettes.  He has a 81.00 pack-year smoking history. He has never used smokeless tobacco. He reports that he uses drugs, including Cocaine and Marijuana. He reports that he does not drink alcohol.  Allergies:  Allergies  Allergen Reactions  . Actos [Pioglitazone] Swelling    EDEMA REACTION UNSPECIFIED  . Avandia [Rosiglitazone] Swelling    SWELLING REACTION UNSPECIFIED   . Dilaudid [Hydromorphone Hcl] Other (See Comments)    Made him crazy    Medications Prior to Admission  Medication Sig Dispense Refill  . acetaminophen (TYLENOL) 500 MG tablet Take 1,000 mg by mouth every 6 (six) hours as needed for moderate  pain.    Marland Kitchen aspirin EC 81 MG tablet Take 81 mg by mouth daily.    Marland Kitchen atorvastatin (LIPITOR) 40 MG tablet Take 40 mg by mouth daily.    Marland Kitchen diltiazem (CARDIZEM CD) 120 MG 24 hr capsule Take 1 capsule (120 mg total) by mouth daily. 90 capsule 3  . diphenhydramine-acetaminophen (TYLENOL PM) 25-500 MG TABS tablet Take 1 tablet by mouth at bedtime as needed (sleep).     . Docosanol (ABREVA EX) Apply 1 application topically daily as needed (cold sores).    Marland Kitchen FLUoxetine (PROZAC) 40 MG capsule TAKE ONE CAPSULE BY MOUTH EVERY DAY 90 capsule 1  . fluticasone (FLONASE) 50 MCG/ACT nasal spray Place 2 sprays into both nostrils daily.  5  . gabapentin (NEURONTIN) 300 MG capsule TAKE 1 CAPSULE BY MOUTH THREE TIMES DAILY 270 capsule 1  . ipratropium-albuterol (DUONEB) 0.5-2.5 (3) MG/3ML SOLN USE 1 VIAL IN NEBULIZER 4 TIMES A DAY (Patient taking differently: Inhale 1 vial every 6 hours as needed for shortness of breath) 360 mL 5  . losartan (COZAAR) 25 MG tablet Take 25 mg by mouth daily.    . Multiple Vitamin (MULTIVITAMIN WITH MINERALS) TABS Take 1 tablet by mouth daily.    . nitroGLYCERIN (NITROSTAT) 0.4 MG SL tablet Place 0.4 mg under the tongue every 5 (five) minutes as needed for chest pain.    . ranitidine (ZANTAC) 150 MG capsule Take 1 capsule (150 mg total) by mouth 2 (two) times daily. (Patient taking differently: Take 150 mg by mouth daily. ) 60 capsule 11  . topiramate (TOPAMAX) 50 MG tablet Take 1 tablet (50 mg total) by mouth 2 (two) times daily. 360 tablet 0  . traZODone (DESYREL) 100 MG tablet TAKE ONE TABLET BY MOUTH EVERY NIGHT AT BEDTIME (Patient taking differently: TAKE ONE TABLET BY MOUTH EVERY NIGHT AT BEDTIME AS NEEDED) 90 tablet 1  . doxycycline (VIBRA-TABS) 100 MG tablet Take 1 tablet (100 mg total) by mouth 2 (two) times daily. (Patient not taking: Reported on 07/18/2017) 16 tablet 0  . LANTUS 100 UNIT/ML injection Inject 12 Units into the skin every morning.   3  . predniSONE (DELTASONE) 10 MG  tablet Take prednisone 64m daily x2 days, then 291mdaily x 2 days, then 1078maily x 2 days then 5mg22mily x2 days and stop. (Patient not taking: Reported on 07/18/2017) 15 tablet 0    Results for orders placed or performed during the  hospital encounter of 07/19/17 (from the past 48 hour(s))  CBC     Status: Abnormal   Collection Time: 07/19/17  7:49 AM  Result Value Ref Range   WBC 9.2 4.0 - 10.5 K/uL   RBC 3.94 (L) 4.22 - 5.81 MIL/uL   Hemoglobin 12.3 (L) 13.0 - 17.0 g/dL   HCT 36.9 (L) 39.0 - 52.0 %   MCV 93.7 78.0 - 100.0 fL   MCH 31.2 26.0 - 34.0 pg   MCHC 33.3 30.0 - 36.0 g/dL   RDW 13.9 11.5 - 15.5 %   Platelets 175 150 - 400 K/uL  Basic metabolic panel     Status: Abnormal   Collection Time: 07/19/17  7:49 AM  Result Value Ref Range   Sodium 138 135 - 145 mmol/L   Potassium 4.4 3.5 - 5.1 mmol/L   Chloride 112 (H) 101 - 111 mmol/L   CO2 19 (L) 22 - 32 mmol/L   Glucose, Bld 113 (H) 65 - 99 mg/dL   BUN 19 6 - 20 mg/dL   Creatinine, Ser 1.41 (H) 0.61 - 1.24 mg/dL   Calcium 8.8 (L) 8.9 - 10.3 mg/dL   GFR calc non Af Amer 50 (L) >60 mL/min   GFR calc Af Amer 58 (L) >60 mL/min    Comment: (NOTE) The eGFR has been calculated using the CKD EPI equation. This calculation has not been validated in all clinical situations. eGFR's persistently <60 mL/min signify possible Chronic Kidney Disease.    Anion gap 7 5 - 15  Glucose, capillary     Status: Abnormal   Collection Time: 07/19/17  7:51 AM  Result Value Ref Range   Glucose-Capillary 114 (H) 65 - 99 mg/dL   No results found.  Review of Systems  All other systems reviewed and are negative.   Blood pressure (!) 124/55, pulse 82, temperature 98.2 F (36.8 C), temperature source Oral, resp. rate 19, height '5\' 11"'  (1.803 m), weight 182 lb (82.6 kg), SpO2 98 %. Physical Exam  Constitutional: He is oriented to person, place, and time. He appears well-developed and well-nourished. No distress.  HENT:  Head: Normocephalic and  atraumatic.  Right Ear: External ear normal.  Left Ear: External ear normal.  Nose: Nose normal.  Mouth/Throat: Oropharynx is clear and moist.  Edentulous.  Voice moderately hoarse.  Eyes: Pupils are equal, round, and reactive to light. Conjunctivae and EOM are normal.  Neck: Normal range of motion. Neck supple.  Cardiovascular: Normal rate.   Respiratory: Effort normal.  Musculoskeletal: Normal range of motion.  Neurological: He is alert and oriented to person, place, and time. No cranial nerve deficit.  Skin: Skin is warm and dry.  Psychiatric: He has a normal mood and affect. His behavior is normal. Judgment and thought content normal.     Assessment/Plan Right laryngeal dysplasia, dysphonia To OR for SMDL with CO2 laser stripping.  Melida Quitter, MD 07/19/2017, 9:16 AM

## 2017-07-19 NOTE — Anesthesia Procedure Notes (Signed)
Procedure Name: Intubation Date/Time: 07/19/2017 10:06 AM Performed by: Trixie Deis A Pre-anesthesia Checklist: Patient identified, Emergency Drugs available, Suction available and Patient being monitored Patient Re-evaluated:Patient Re-evaluated prior to induction Oxygen Delivery Method: Circle System Utilized Preoxygenation: Pre-oxygenation with 100% oxygen Induction Type: IV induction Ventilation: Mask ventilation without difficulty Laryngoscope Size: Mac and 4 Grade View: Grade I Laser Tube: Laser Tube and Cuffed inflated with minimal occlusive pressure - saline Tube size: 6.0 mm Number of attempts: 1 Airway Equipment and Method: Stylet and Oral airway Placement Confirmation: ETT inserted through vocal cords under direct vision,  positive ETCO2 and breath sounds checked- equal and bilateral Tube secured with: Tape (left ) Dental Injury: Teeth and Oropharynx as per pre-operative assessment

## 2017-07-19 NOTE — Anesthesia Postprocedure Evaluation (Signed)
Anesthesia Post Note  Patient: [name]  Procedure(s) Performed: Procedure(s) (LRB): MICROLARYNGOSCOPY WITH CO2 LASER AND EXCISION OF VOCAL CORD LESION (N/A)     Patient location during evaluation: PACU Anesthesia Type: General Level of consciousness: awake and alert and oriented Pain management: pain level controlled Vital Signs Assessment: post-procedure vital signs reviewed and stable Respiratory status: spontaneous breathing, nonlabored ventilation and respiratory function stable Cardiovascular status: blood pressure returned to baseline and stable Postop Assessment: no apparent nausea or vomiting Anesthetic complications: no    Last Vitals:  Vitals:   07/19/17 1145 07/19/17 1200  BP: 138/64 138/64  Pulse: 76 73  Resp: 15 11  Temp:    SpO2: 97% 94%    Last Pain:  Vitals:   07/19/17 1200  TempSrc:   PainSc: 0-No pain                 Khayman Kirsch,Casey A.

## 2017-07-19 NOTE — Discharge Instructions (Signed)
Take either prescription medicine for pain or Tylenol.  May take Motrin in addition to either medicine.

## 2017-07-19 NOTE — Brief Op Note (Signed)
07/19/2017  11:12 AM  PATIENT:  [name]  66 y.o. male  PRE-OPERATIVE DIAGNOSIS:  vocal cord lesion  POST-OPERATIVE DIAGNOSIS:  same  PROCEDURE:  Procedure(s) with comments: MICROLARYNGOSCOPY WITH CO2 LASER AND EXCISION OF VOCAL CORD LESION (N/A) - MICRO DIRECT LARYNGOSCOPY WITH CO2 LASER VOCAL CORD STRIPPING/POSS JET VENTURI VENTILATION  SURGEON:  Surgeon(s) and Role:    Melida Quitter, MD - Primary  PHYSICIAN ASSISTANT:   ASSISTANTS: none   ANESTHESIA:   general  EBL:  Total I/O In: 1000 [I.V.:1000] Out: -   BLOOD ADMINISTERED:none  DRAINS: none   LOCAL MEDICATIONS USED:  NONE  SPECIMEN:  Source of Specimen:  right false cord and right true cord  DISPOSITION OF SPECIMEN:  PATHOLOGY  COUNTS:  YES  TOURNIQUET:  * No tourniquets in log *  DICTATION: .Other Dictation: Dictation Number [id number]  PLAN OF CARE: Discharge to home after PACU  PATIENT DISPOSITION:  PACU - hemodynamically stable.   Delay start of Pharmacological VTE agent (>24hrs) due to surgical blood loss or risk of bleeding: yes

## 2017-07-19 NOTE — Op Note (Signed)
NAMEPERRIN, GENS NO.:  [id number]  MEDICAL RECORD NO.:  [id number]  LOCATION:  [facility]                         FACILITY:  [facility]  PHYSICIAN:  Onnie Graham, MD     DATE OF BIRTH:  10/02/1951  DATE OF PROCEDURE:  07/19/2017 DATE OF DISCHARGE:                              OPERATIVE REPORT   PREOPERATIVE DIAGNOSIS:  Right laryngeal dysplasia.  POSTOPERATIVE DIAGNOSIS:  Right laryngeal dysplasia.  PROCEDURE:  Suspended Microdirect laryngoscopy with CO2 laser stripping of right true and false vocal folds.  SURGEON:  Onnie Graham, MD.  ANESTHESIA:  General endotracheal anesthesia.  COMPLICATIONS:  None.  INDICATION:  The patient is a 66 year old male, who has several months of hoarse voice and was found to have abnormal-appearing mucosa of the right false and true vocal folds.  A biopsy was performed a couple of weeks ago that demonstrated dysplasia, but no signs of invasive carcinoma.  He presents to the operating room for surgical stripping of the cord and false cord for better biopsy.  FINDINGS:  There was again irregular mucosa of the right false vocal fold as well as the true vocal fold encompassing the entire length of the true vocal fold.  The abnormality does not spread to the left side and does not extend into the subglottis.  It does extend toward the ventricle on the superior surface of the vocal fold.  Most of that surface was removed and sent for pathology with a separate specimen from the right false cord and one from the right true cord.  DESCRIPTION OF PROCEDURE:  The patient was identified in the holding room.  Informed consent having been obtained including discussion of risks, benefits, and alternatives, the patient was brought to the operative suite and put on the operating table in supine position. Anesthesia was induced and the patient was intubated by the Anesthesia team with a laser safe tube without difficulty.  The eyes taped  closed, and bed was turned 90 degrees from anesthesia.  A damp gauze was placed over the upper gum and the eyes were covered with damp eye pads.  A Storz laryngoscope was then inserted and placed in the supraglottic position and suspended to the Mayo stand using a Lewy arm.  A 0-degree telescope was used to make a preoperative photograph.  Epinephrine- soaked pledgets were held against the right side of the larynx while the microscope was brought into view.  The pledgets were removed.  The CO2 laser was then used on a setting of 8 watts to make an incision superior and lateral to the false cord abnormal area.  The mucosa was then grasped at its edge and retracted medially while the laser was used to make a submucosal dissection down to the ventricle.  A rectangular shaped piece of tissue was then removed in 2 pieces and passed to Nursing for pathology.  The laryngoscope was repositioned to better expose the true cord.  Again, the CO2 laser was used at this time on a 4- watt setting to make an incision at the anterior commissure and then around the lateral extent of the abnormal mucosa.  A true clear lateral margin was not encountered as the  abnormality extended into the ventricle.  Tissue was removed in a piecemeal fashion from the true cord off leaving the ligament and deeper tissues intact.  The laser was used along with cup forceps to accomplish this.  Most of the way through this part of the procedure, the cuffs on the endotracheal tube were ruptured by the laser.  At this point, the endotracheal tube was backed out and a regular 6-0 endotracheal tube was placed through the laryngoscope and the cuff inflated.  The patient was successfully ventilated.  After a few minutes, the endotracheal tube was removed and the patient was changed to jet Venturi ventilation.  The posterior extent of the true cord was then further stripped using the cup forceps and CO2 laser as before.  An  epinephrine pledget was held against the right side for a couple of minutes.  A postoperative photograph was made with a 0-degree telescope.  The vocal folds were sprayed with 2% lidocaine.  At this point, the jet Venturi ventilation was ceased and the 6-0 endotracheal tube placed again through the laryngoscope.  The cuff was inflated, and the patient successfully ventilated.  The laryngoscope was then taken out of suspension and removed from the patient's mouth while holding the endotracheal tube in place.  The balls were removed, and the patient was successfully ventilated once again through the endotracheal tube which was taped to the left side.  The patient was turned back to Anesthesia for wake-up, was extubated and moved to the recovery room in stable condition.     Onnie Graham, MD     DDB/MEDQ  D:  07/19/2017  T:  07/19/2017  Job:  [id number]  cc:   Onnie Graham, MD's office

## 2017-07-20 ENCOUNTER — Encounter (HOSPITAL_COMMUNITY): Payer: Self-pay | Admitting: Otolaryngology

## 2017-07-24 ENCOUNTER — Encounter: Payer: Self-pay | Admitting: Radiation Oncology

## 2017-07-24 ENCOUNTER — Telehealth: Payer: Self-pay | Admitting: Internal Medicine

## 2017-07-24 NOTE — Telephone Encounter (Signed)
Notes recorded by Brand Males, MD on 07/24/2017 at 1:59 PM EDT Ct sinus essentially nromall  Spoke with pt's wife. She is aware of results. Nothing else needed at time of call.

## 2017-07-26 ENCOUNTER — Other Ambulatory Visit: Payer: Self-pay | Admitting: Family Medicine

## 2017-07-26 ENCOUNTER — Telehealth: Payer: Self-pay | Admitting: *Deleted

## 2017-07-26 NOTE — Telephone Encounter (Signed)
Oncology Nurse Navigator Documentation  Placed introductory call to new referral patient [name].  I spoke with his wife as he was not available.  Introduced myself as the H&N oncology nurse navigator that works with Dr. Tammi Klippel to whom he has been referred by Dr. Redmond Baseman.  She confirmed their understanding of referral and appt date/time of 10/3 1500.  I briefly explained my role as their navigator, indicated that I would be joining them during his appt.  She noted they had another navigator when he received treatment for lung cancer.  I provided my contact information, encouraged them to call with questions/concerns before appt.  She verbalized understanding of information provided, expressed appreciation for my call.  Gayleen Orem, RN, BSN, [place] [facility] Nurse [place] [facility] [place] [phone]

## 2017-08-02 NOTE — Progress Notes (Signed)
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 Biopsies of  (if applicable) [id number] Laser biopsy result  Mild squamous cell carcinoma, 06-28-17 MICROLARYNGOSCOPY WITH BIOPSY, He was found, by biopsy, to have right-sided laryngeal dysplasia and presents for microlaryngoscopy with stripping.  Nutrition Status Yes No Comments  Weight changes? [x]  []   .6oz  Swallowing concerns? [x]  []    PEG? []  [x]     Referrals Yes No Comments  Social Work? []  [x]    Dentistry? []  [x]    Swallowing therapy? []  [x]    Nutrition? []  [x]    Med/Onc? []  [x]     Safety Issues Yes No Comments  Prior radiation? [x]  []  09-17-16-09-24-16 Left lower lung      Pacemaker/ICD? []  [x]    Possible current pregnancy? []  [x]    Is the patient on methotrexate? []  [x]     Tobacco/Marijuana/Snuff/ETOH GQB:[id number] smoker 1.5 P/D x 54 years drug usage cocaine and marijuana no alcohol  Past/Anticipated interventions by otolaryngology, if any: 07-19-17 Vocal cord biopsy ,06-28-17 MICROLARYNGOSCOPY WITH CO2 LASER AND EXCISION OF VOCAL CORD LESION  Past/Anticipated interventions by medical oncology, if any: N/A Saw Dr. Redmond Baseman 08-05-17 for follow visit will see again after completion of radiation.  Current Complaints / other details: 66 y.o. 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

## 2017-08-05 DIAGNOSIS — C320 Malignant neoplasm of glottis: Secondary | ICD-10-CM | POA: Diagnosis not present

## 2017-08-07 ENCOUNTER — Ambulatory Visit
Admission: RE | Admit: 2017-08-07 | Discharge: 2017-08-07 | Disposition: A | Discharge status: Home / Self Care | Payer: PPO | Source: Ambulatory Visit | Attending: Urology | Admitting: Urology

## 2017-08-07 ENCOUNTER — Encounter: Payer: Self-pay | Admitting: Urology

## 2017-08-07 ENCOUNTER — Ambulatory Visit
Admission: RE | Admit: 2017-08-07 | Discharge: 2017-08-07 | Disposition: A | Discharge status: Home / Self Care | Payer: PPO | Source: Ambulatory Visit | Attending: Radiation Oncology | Admitting: Radiation Oncology

## 2017-08-07 VITALS — BP 101/57 | HR 79 | Temp 98.5°F | Resp 18 | Ht 71.0 in | Wt 181.4 lb

## 2017-08-07 DIAGNOSIS — F17210 Nicotine dependence, cigarettes, uncomplicated: Secondary | ICD-10-CM | POA: Diagnosis not present

## 2017-08-07 DIAGNOSIS — Z7982 Long term (current) use of aspirin: Secondary | ICD-10-CM | POA: Insufficient documentation

## 2017-08-07 DIAGNOSIS — Z85118 Personal history of other malignant neoplasm of bronchus and lung: Secondary | ICD-10-CM | POA: Diagnosis not present

## 2017-08-07 DIAGNOSIS — I252 Old myocardial infarction: Secondary | ICD-10-CM | POA: Insufficient documentation

## 2017-08-07 DIAGNOSIS — N189 Chronic kidney disease, unspecified: Secondary | ICD-10-CM | POA: Diagnosis not present

## 2017-08-07 DIAGNOSIS — I2510 Atherosclerotic heart disease of native coronary artery without angina pectoris: Secondary | ICD-10-CM | POA: Insufficient documentation

## 2017-08-07 DIAGNOSIS — F329 Major depressive disorder, single episode, unspecified: Secondary | ICD-10-CM | POA: Insufficient documentation

## 2017-08-07 DIAGNOSIS — R05 Cough: Secondary | ICD-10-CM | POA: Insufficient documentation

## 2017-08-07 DIAGNOSIS — C3411 Malignant neoplasm of upper lobe, right bronchus or lung: Secondary | ICD-10-CM | POA: Diagnosis not present

## 2017-08-07 DIAGNOSIS — C320 Malignant neoplasm of glottis: Secondary | ICD-10-CM | POA: Diagnosis not present

## 2017-08-07 DIAGNOSIS — E079 Disorder of thyroid, unspecified: Secondary | ICD-10-CM | POA: Insufficient documentation

## 2017-08-07 DIAGNOSIS — I129 Hypertensive chronic kidney disease with stage 1 through stage 4 chronic kidney disease, or unspecified chronic kidney disease: Secondary | ICD-10-CM | POA: Insufficient documentation

## 2017-08-07 DIAGNOSIS — E1142 Type 2 diabetes mellitus with diabetic polyneuropathy: Secondary | ICD-10-CM | POA: Diagnosis not present

## 2017-08-07 DIAGNOSIS — M545 Low back pain: Secondary | ICD-10-CM | POA: Insufficient documentation

## 2017-08-07 DIAGNOSIS — K219 Gastro-esophageal reflux disease without esophagitis: Secondary | ICD-10-CM | POA: Insufficient documentation

## 2017-08-07 DIAGNOSIS — Z923 Personal history of irradiation: Secondary | ICD-10-CM | POA: Diagnosis not present

## 2017-08-07 DIAGNOSIS — J449 Chronic obstructive pulmonary disease, unspecified: Secondary | ICD-10-CM | POA: Insufficient documentation

## 2017-08-07 DIAGNOSIS — Z79899 Other long term (current) drug therapy: Secondary | ICD-10-CM | POA: Insufficient documentation

## 2017-08-07 DIAGNOSIS — E1122 Type 2 diabetes mellitus with diabetic chronic kidney disease: Secondary | ICD-10-CM | POA: Insufficient documentation

## 2017-08-07 NOTE — Progress Notes (Signed)
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  Interval Since Last Radiation: 10.5 months 09/17/2016 to 09/24/2016 SBRT Treatment:  The LLL target was treated to 54 Gy in 3 fractions of 18 Gy  Narrative:  In brief, this is a pleasant gentleman with a history of Stage IA NSCLC, squamous cell of the left lower lobe of the lung who completed SBRT to the target in November 2017. He also has a history of Stage I, NSCLC, squamous cell of the right upper lobe, which was resected in 2013 with Dr. [name]. He was followed in surveillance which is when his most recent cancer was identified. He has been followed in surveillance since SBRT and has had several mediastinal nodes that have been stable at 9-10 mm. His follow up Chest CT on 05/06/17 revealed no new concerning lung lesions, and post radiotherapy change in the LLL. In the mediastinum, he had persistent adenopathy which was felt to be most likely reactive.  At the time of his last follow up visit on 05/07/17, he reported feeling poorly, a 20 lb unintentional weight loss, cold/heat intolerance and new onset of hoarseness of voice. He was treated with a course of antibiotics and recommended to undergo further evaluation with PET scan after completion of antibiotics.  He was also recommended to follow up with his PCP, endocrinology and ENT at that time.  A PET scan on 05/31/17 showed low-grade activity in a mildly enlarged right lower paratracheal lymph node with max SUV of 4.1 (formerly 4.6). There was also low-grade activity in indistinct left hilar lymph node, max SUV of 3.0. At the site of the prior superior segment left  lower lobe pulmonary nodule, there was evidence of radiation pneumonitis with low level activity characteristic of radiation fibrosis/pneumonitis, but no focal nodular residual/recurrent hypermetabolic activity.  Additionally, there was diffuse accentuated activity in the proximal 75% of the stomach associated with mild wall thickening but also nondistention.  This was further evaluated with endoscopy by Dr. Watt Climes and no concerning findings were noted.  His PCP referred him to Dr. Redmond Baseman for further evaluation of the hoarseness of voice and on exam, was found to have a concerning lesion on the right vocal cord warranting biopsy.  He underwent microlaryngoscopy for biopsy on 06/28/17 with Dr. Redmond Baseman. Biopsy of the right false vocal cord showed squamous papilloma with mild squamous dysplasia. Biopsy of the right true vocal cord showed squamous papilloma with mild to focally moderate squamous dysplasia.  He underwent repeat microlaryngoscopy with CO2 laser stripping of the right true and false vocal cords on 07/19/17 with Dr. Redmond Baseman. Biopsy of the right false vocal cord showed mild squamous dysplasia. Biopsy of the right true vocal cord showed invasive squamous cell carcinoma.  He has been referred today, by Dr. 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  On review of systems, the patient states he is doing well overall. He is accompanied by his wife today.  He denies any chest pain, shortness of breath, cough, fevers or chills. He denies any bowel or bladder  disturbances, and denies abdominal pain, nausea or vomiting. He denies any new musculoskeletal or joint aches or pains, new skin lesions or concerns. He does report dysphagia, continued weight loss and hoarse voice. He has noted an unintentional weight loss of about 20 lbs since December 2017. He denies any palpably enlarged or tender lymph nodes. He  denies leg or ankle swelling. He is considering having new dentures made as his current ones do not fit well secondary to weight loss (overall, he has lost approximately 120 lbs since 2017). A complete review of systems is obtained and is otherwise negative.   Past Medical History:  Past Medical History:  Diagnosis Date  . Allergy   . Anemia   . Anginal pain ([place])   . Arthritis    "hands"  . Asthma   . Back pain    4 deteriorating disc and receives an injection q3-78mon;has been doing this for about 66yrs  . Blood transfusion    as a child  . Bronchitis   . Cancer ([place]) dx'd 2013   Non-small cell lung cancer  . Chronic kidney disease    acute kidney failure post surgery  . COPD (chronic obstructive pulmonary disease) (HCC)    uses Albuterol and Spiriva daily  . Coronary artery disease    has 1 stent  . Depression    takes Prozac daily  . Emphysema    sees Dr.Ramaswami for this  . Family history of adverse reaction to anesthesia    mother was hard to wake up sometimes  . GERD (gastroesophageal reflux disease)    takes Prilosec daily, EGD nml 06/2017  . Glaucoma    hx of  . H/O hiatal hernia   . Hypertension    takes  HYzaar daily  . Insomnia    takes Trazodone nightly  . Lung mass    right upper lobe  . Myocardial infarct ([place])   . 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 Obesity   . 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 Periodic limb movements of sleep   . Peripheral neuropathy   . Pneumonia    hx of' 66 yo,rd' last time in 2013  . Productive cough    white in color but no odor  . Shortness of breath    with exertion   . Sleep apnea    uses BiPaP; "no longer have sleep apnea since I have lost over 100 lbs"  . Syncope and collapse 05/26/2013  . Thyroid disease   . Type II diabetes mellitus (HCC)    takes Metformin bid and Novolog and Lantus daily    Past Surgical History: Past Surgical History:  Procedure Laterality Date  . CARDIAC CATHETERIZATION   2006/2008/2009  . CARPAL TUNNEL RELEASE     bilateral  . COLONOSCOPY    . CORONARY ANGIOPLASTY WITH STENT PLACEMENT     1 stent  . ELBOW SURGERY     ulnar nerve; left  . ESOPHAGOGASTRODUODENOSCOPY    . EYE SURGERY     pt. denies  . FUDUCIAL PLACEMENT Left 08/24/2016   Procedure: PLACEMENT OF FUDUCIAL;  Surgeon: Melrose Nakayama, MD;  Location: [facility];  Service: Thoracic;  Laterality: Left;  . INGUINAL HERNIA REPAIR  1990's   double inguinal  . LOBECTOMY  03/17/2012   upper right side with resection  . MICROLARYNGOSCOPY WITH CO2 LASER AND EXCISION OF VOCAL CORD LESION N/A 06/28/2017   Procedure: MICROLARYNGOSCOPY WITH BIOPSY;  Surgeon: Melida Quitter, MD;  Location: [facility];  Service: ENT;  Laterality: N/A;  . MICROLARYNGOSCOPY WITH CO2 LASER AND EXCISION OF VOCAL CORD LESION N/A 07/19/2017   Procedure: MICROLARYNGOSCOPY WITH CO2 LASER AND EXCISION OF VOCAL CORD LESION;  Surgeon: Melida Quitter, MD;  Location: [facility];  Service: ENT;  Laterality: N/A;  MICRO DIRECT LARYNGOSCOPY WITH CO2 LASER VOCAL CORD STRIPPING/POSS JET VENTURI VENTILATION  . REFRACTIVE SURGERY     bilaterally  . ROTATOR CUFF REPAIR     left  . VIDEO BRONCHOSCOPY WITH ENDOBRONCHIAL NAVIGATION N/A 08/24/2016   Procedure: VIDEO BRONCHOSCOPY WITH ENDOBRONCHIAL NAVIGATION;  Surgeon: Melrose Nakayama, MD;  Location: [facility];  Service: Thoracic;  Laterality: N/A;    Social History:  Social History   Social History  . Marital status: Married    Spouse name: mary lou  . Number of children: 3  . Years of education: trade    Occupational History  . truck driver     retired   Social History Main Topics  . Smoking status: Current Every Day Smoker    Packs/day: 1.50    Years: 54.00    Types: Cigarettes  . Smokeless tobacco: Never Used     Comment: 1ppd 8.30.18 ee  . Alcohol use No     Comment: no alcolol since 2013  . Drug use: Yes    Types: Cocaine, Marijuana     Comment: quit 1990's  . Sexual activity: Not  Currently   Other Topics Concern  . 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 COPD Mother   . Diabetes Mother   . Hypertension Mother   . Heart attack Father   . Diabetes Father   . Anesthesia problems Neg Hx   . Hypotension Neg Hx   . Malignant hyperthermia Neg Hx   . Pseudochol deficiency Neg Hx      ALLERGIES:  is allergic to actos [pioglitazone]; avandia [rosiglitazone]; and dilaudid [hydromorphone hcl].  Meds: Current Outpatient Prescriptions  Medication Sig Dispense Refill  . acetaminophen (TYLENOL) 500 MG tablet Take 1,000 mg by mouth every 6 (six) hours as needed for moderate pain.    Marland Kitchen aspirin EC 81 MG tablet Take 81 mg by mouth daily.    Marland Kitchen atorvastatin (LIPITOR) 40 MG tablet Take 40 mg by mouth daily.    Marland Kitchen diltiazem (CARDIZEM CD) 120 MG 24 hr capsule Take 1 capsule (120 mg total) by mouth daily. 90 capsule 3  . diphenhydramine-acetaminophen (TYLENOL PM) 25-500 MG TABS tablet Take 1 tablet by mouth at bedtime as needed (sleep).     Marland Kitchen FLUoxetine (PROZAC) 40 MG capsule TAKE ONE CAPSULE BY MOUTH EVERY DAY 90 capsule 1  . fluticasone (FLONASE) 50 MCG/ACT nasal spray Place 2 sprays into both nostrils daily.  5  . gabapentin (NEURONTIN) 300 MG capsule TAKE 1 CAPSULE BY MOUTH THREE TIMES DAILY 270 capsule 1  . ipratropium-albuterol (DUONEB) 0.5-2.5 (3) MG/3ML SOLN USE 1 VIAL IN NEBULIZER 4 TIMES A DAY (Patient taking differently: Inhale 1 vial every 6 hours as needed for shortness of breath) 360 mL 5  . LANTUS 100 UNIT/ML injection Inject 12 Units into the skin every morning.   3  . losartan (  COZAAR) 25 MG tablet Take 25 mg by mouth daily.    . Multiple Vitamin (MULTIVITAMIN WITH MINERALS) TABS Take 1 tablet by mouth daily.    . ranitidine (ZANTAC) 150 MG  capsule Take 1 capsule (150 mg total) by mouth 2 (two) times daily. (Patient taking differently: Take 150 mg by mouth daily. ) 60 capsule 11  . topiramate (TOPAMAX) 50 MG tablet Take 1 tablet (50 mg total) by mouth 2 (two) times daily. 360 tablet 0  . traZODone (DESYREL) 100 MG tablet TAKE ONE TABLET BY MOUTH EVERY NIGHT AT BEDTIME (Patient taking differently: TAKE ONE TABLET BY MOUTH EVERY NIGHT AT BEDTIME AS NEEDED) 90 tablet 1  . Docosanol (ABREVA EX) Apply 1 application topically daily as needed (cold sores).    Marland Kitchen HYDROcodone-acetaminophen (HYCET) 7.5-325 mg/15 ml solution Take 10 mLs by mouth every 4 (four) hours as needed for moderate pain. (Patient not taking: Reported on 08/07/2017) 120 mL 0  . nitroGLYCERIN (NITROSTAT) 0.4 MG SL tablet Place 0.4 mg under the tongue every 5 (five) minutes as needed for chest pain.     No current facility-administered medications for this encounter.     Physical Findings:  height is 5\' 11"  (1.803 m) and weight is 181 lb 6.4 oz (82.3 kg). His oral temperature is 98.5 F (36.9 C). His blood pressure is 101/57 (abnormal) and his pulse is 79. His respiration is 18 and oxygen saturation is 95%.   In general this is a well appearing caucasian male in no acute distress. He's alert and oriented x4 and appropriate throughout the examination. HEENT reveals that the patient is normocephalic, atraumatic. EOMs are intact. PERRLA. Skin is intact without any evidence of gross lesions. Cardiovascular exam reveals a regular rate and rhythm, no clicks rubs or murmurs are auscultated. Chest is clear to auscultation bilaterally. Lymphatic assessment is performed and does not reveal any adenopathy in the cervical, supraclavicular, or axillary chains.  Abdomen has active bowel sounds in all quadrants and is intact. The abdomen is soft, non tender, non distended. Lower extremities are negative for pretibial pitting edema, deep calf tenderness, cyanosis or clubbing.  Lab  Findings: Lab Results  Component Value Date   WBC 9.2 07/19/2017   HGB 12.3 (L) 07/19/2017   HCT 36.9 (L) 07/19/2017   MCV 93.7 07/19/2017   PLT 175 07/19/2017     Radiographic Findings: [place] [facility] Cm  Result Date: 07/10/2017 CLINICAL DATA:  Chronic sinusitis. Sinus pressure, drainage, and worse waist for several months. EXAM: CT PARANASAL SINUS LIMITED WITHOUT CONTRAST TECHNIQUE: Non-contiguous multidetector CT images of the paranasal sinuses were obtained in a single plane without contrast. COMPARISON:  None FINDINGS: Fluid or mucosal thickening is noted along the inferior left maxillary sinus. Screening examination demonstrates no other focal disease. There are no fluid levels. Nasal septum is midline. Limited imaging the brain is unremarkable. Atherosclerotic calcifications are present within the cavernous internal carotid arteries. IMPRESSION: 1. Minimal mucosal thickening or fluid in the left maxillary sinus without other significant sinus disease. Electronically Signed   By: San Morelle M.D.   On: 07/10/2017 10:25    Impression/Plan: 1.  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  Today, we talked to the patient and his wife about the findings and work-up thus far.  We discussed the natural history of squamous cell carcinoma of the larynx and general treatment, highlighting the role of radiotherapy in the management.  We discussed the available radiation techniques,  and focused on the details of logistics and delivery.  We reviewed the anticipated acute and late sequelae associated with radiation in this setting.  The patient was encouraged to ask questions that were answered to the best of our ability. We recommend a  6 1/2 week course of daily radiation treatments. At the end of our conversation today, the patient elects to proceed with radiotherapy and has been scheduled for CT simulation next Thursday, 08/15/17, at 9 am.   2. Stage IA,  T1aN0squamous cell carcinoma of the right upper lobe and left lower lobe of lung. We will continue to monitor his disease with serial CT Chest scans q 3-6 months with follow up visits to discuss findings.  He knows to call with any questions or concerns in the interim.  3 Nutrition. The patient has experienced significant weight loss related to his disease. There is a chance he will continue to lose weight with radiation treatment. We talked about this and discussed techniques to increase calorie intake, including dietary supplements like Boost or Ensure. We will monitor his weight closely throughout treatment.    Nicholos Johns, PA-C    Tyler Pita, MD  [facility] Direct Dial: [phone]  Fax: [phone] [email or web address]  Skype  [email or web address]   This document serves as a record of services personally performed by Tyler Pita, MD and Freeman Caldron, PA-C. It was created on their behalf by Arlyce Harman, a trained medical scribe. The creation of this record is based on the scribe's personal observations and the provider's statements to them. This document has been checked and approved by the attending provider.

## 2017-08-09 ENCOUNTER — Ambulatory Visit (INDEPENDENT_AMBULATORY_CARE_PROVIDER_SITE_OTHER): Payer: PPO | Admitting: Podiatry

## 2017-08-09 DIAGNOSIS — M79676 Pain in unspecified toe(s): Secondary | ICD-10-CM

## 2017-08-09 DIAGNOSIS — M2010 Hallux valgus (acquired), unspecified foot: Secondary | ICD-10-CM

## 2017-08-09 DIAGNOSIS — B351 Tinea unguium: Secondary | ICD-10-CM

## 2017-08-09 DIAGNOSIS — E1151 Type 2 diabetes mellitus with diabetic peripheral angiopathy without gangrene: Secondary | ICD-10-CM

## 2017-08-09 DIAGNOSIS — E1142 Type 2 diabetes mellitus with diabetic polyneuropathy: Secondary | ICD-10-CM

## 2017-08-09 NOTE — Progress Notes (Signed)
Subjective:     Patient ID: [name], male   DOB: Mar 29, 1951, 66 y.o.   MRN: [id number]  HPI this patient presents to the office with chief complaint of long thick painful nails. The nails have become thick and long and are painful walking and wearing his shoe.  He is diabetic.  He presents for preventive footcare services. Patient doing well with his new diabetic shoes.    Review of Systems     Objective:   Physical Exam GENERAL APPEARANCE: Alert, conversant. Appropriately groomed. No acute distress.  VASCULAR: Pedal pulses are diminished but  palpable at  [facility] and PT bilateral.  Capillary refill time is immediate to all digits,  Normal temperature gradient.  Digital hair growth is present bilateral  NEUROLOGIC: sensation is normal to 5.07 monofilament at 5/5 sites bilateral.  Light touch is intact bilateral, Muscle strength normal.  MUSCULOSKELETAL: acceptable muscle strength, tone and stability bilateral.  Intrinsic muscluature intact bilateral.  Rectus appearance of foot and digits noted bilateral. HAV  B/L.  Disdifured distal phalanx right left great toe.  DERMATOLOGIC: skin color, texture, and turgor are within normal limits.  No preulcerative lesions or ulcers  are seen, no interdigital maceration noted.  No open lesions present.   No drainage noted.  NAILS  Thick disfigured discolored nails both feet.        Assessment:     Onychomycosis  .  Diabetic neuropathy.  HAV    Plan:     Debride nails B/L   RTC 3 months for nail care. Dispense diabetic shoes.     Gardiner Barefoot DPM    Gardiner Barefoot DPM

## 2017-08-13 ENCOUNTER — Telehealth: Payer: Self-pay | Admitting: Urology

## 2017-08-13 NOTE — Telephone Encounter (Signed)
I called and spoke with Wynelle Cleveland regarding her request for a return phone call.  She was inquiring about whether Ronalee Belts could get his Flu shot prior to starting treatment.  I advised that this would be just fine and in fact preferred that he receive the Flu shot.  She also inquired about getting the Shingles vaccine and I advised that the Shingles vaccine be delayed until after he has completed treatment.  She states her understanding of this information and appreciating for the return call.  She will discuss the Shingles vaccination with his PCP once he has completed his treatment.  She reports that she will bring Ronalee Belts as planned on Thursday 08/15/17 for CT [facility] planning.  I advised to call with any questions or concerns in the interim. Ailene Ards

## 2017-08-13 NOTE — Telephone Encounter (Signed)
-----   Message from Kerri Perches sent at 08/13/2017 10:57 AM EDT ----- Regarding: PHONE [place],   Please call Candelaria Stagers, regarding her husband Jamon Hayhurst.  Ms. Vanzandt's phone number is [phone].  Enid Derry

## 2017-08-15 ENCOUNTER — Ambulatory Visit
Admission: RE | Admit: 2017-08-15 | Discharge: 2017-08-15 | Disposition: A | Discharge status: Home / Self Care | Payer: PPO | Source: Ambulatory Visit | Attending: Radiation Oncology | Admitting: Radiation Oncology

## 2017-08-15 ENCOUNTER — Encounter: Payer: Self-pay | Admitting: *Deleted

## 2017-08-15 ENCOUNTER — Other Ambulatory Visit: Payer: Self-pay | Admitting: *Deleted

## 2017-08-15 DIAGNOSIS — C320 Malignant neoplasm of glottis: Secondary | ICD-10-CM

## 2017-08-15 DIAGNOSIS — C3411 Malignant neoplasm of upper lobe, right bronchus or lung: Secondary | ICD-10-CM | POA: Diagnosis not present

## 2017-08-15 NOTE — Progress Notes (Signed)
  Radiation Oncology         [phone] ________________________________  Name: [name] MRN: [id number]  Date: 08/15/2017  DOB: 1951-05-29  SIMULATION AND TREATMENT PLANNING NOTE    ICD-10-CM   1. 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  Identity was confirmed.  All relevant records and images related to the planned course of therapy were reviewed.  The patient freely provided informed written consent to proceed with treatment after reviewing the details related to the planned course of therapy. The consent form was witnessed and verified by the simulation staff.  Then, the patient was set-up in a stable reproducible  supine position for radiation therapy.  CT images were obtained.  Surface markings were placed.  The CT images were loaded into the planning software.  Then the target and avoidance structures were contoured.  Treatment planning then occurred.  The radiation prescription was entered and confirmed.  Then, I designed and supervised the construction of a total of 2 medically necessary complex treatment devices with mask and accuform neck cushion.  I have requested : 3D Simulation  I have requested a DVH of the following structures: spinal cord, target, and pharynx.  I have ordered:Nutrition Consult  PLAN:  The patient will receive 63 Gy in 28 fraction.  ________________________________  Sheral Apley Tammi Klippel, M.D.   This document serves as a record of services personally performed by Tyler Pita MD. It was created on his behalf by Delton Coombes, a trained medical scribe. The creation of this record is based on the scribe's personal observations and the provider's statements to them. This document has been checked and approved by the attending provider.

## 2017-08-15 NOTE — Progress Notes (Signed)
Oncology Nurse Navigator Documentation  Met with Mr. Grafton and his wife during his CT SIM.   He tolerated procedure without difficulty. Showed them LINAC 1 treatment area, explained procedure for arrival and preparation for treatment. Provided New Patient Information packet, discussed contents:  Contact information for physician(s), myself, other members of the Care Team.  Advance Directive information ([place] blue pamphlet with LCSW contact info), provide AD document.  Fall Prevention Patient Safety Plan  Appointment Guideline  Financial Assistance Information sheet  [place] with highlight of Stella We discussed H&N [place], he agreed to attend the 10/23 clinic.  I provided [employer] Information Sheet, arrival time of 0800. I encouraged them to contact me with questions/concerns as treatments/procedures begin.   Gayleen Orem, RN, BSN, [place] [facility] Nurse [place] at [place] [phone]

## 2017-08-19 ENCOUNTER — Inpatient Hospital Stay: Admission: RE | Admit: 2017-08-19 | Payer: Self-pay | Source: Ambulatory Visit | Admitting: Radiation Oncology

## 2017-08-21 DIAGNOSIS — C320 Malignant neoplasm of glottis: Secondary | ICD-10-CM | POA: Diagnosis not present

## 2017-08-21 DIAGNOSIS — C3411 Malignant neoplasm of upper lobe, right bronchus or lung: Secondary | ICD-10-CM | POA: Diagnosis not present

## 2017-08-23 ENCOUNTER — Telehealth: Payer: Self-pay | Admitting: Interventional Cardiology

## 2017-08-23 NOTE — Telephone Encounter (Signed)
Spoke with wife, DPR on file.  Dr. Tammi Klippel, oncologist, had suggested they call and let Dr. Tamala Julian know pt will be starting radiation for left laryngeal stage 1 squamous cell cancer.  He will have radiation for 6 wks.  Advised wife I would let Dr. Tamala Julian know and we will call back if we need anything prior to pt finishing radiation.  Wife appreciative for call.  Spoke with Dr. Tamala Julian and made him aware.  He said ok for pt to keep appt in December and just plan to be seen then, nothing needed prior to that.

## 2017-08-23 NOTE — Telephone Encounter (Signed)
New Message  Pt wife call requesting to speak with RN to inform Dr. Tamala Julian pt has started back with chemo. Please call back to discuss if needed.

## 2017-08-23 NOTE — Telephone Encounter (Signed)
Left message to call back  

## 2017-08-26 ENCOUNTER — Encounter: Payer: Self-pay | Admitting: *Deleted

## 2017-08-26 ENCOUNTER — Ambulatory Visit
Admission: RE | Admit: 2017-08-26 | Discharge: 2017-08-26 | Disposition: A | Discharge status: Home / Self Care | Payer: PPO | Source: Ambulatory Visit | Attending: Radiation Oncology | Admitting: Radiation Oncology

## 2017-08-26 DIAGNOSIS — C3411 Malignant neoplasm of upper lobe, right bronchus or lung: Secondary | ICD-10-CM | POA: Diagnosis not present

## 2017-08-26 DIAGNOSIS — C320 Malignant neoplasm of glottis: Secondary | ICD-10-CM | POA: Diagnosis not present

## 2017-08-26 NOTE — Progress Notes (Signed)
Oncology Nurse Navigator Documentation  To provide support, encouragement and care continuity, met with [name] during his initial RT.  He was accompanied by his wife. RTT [name] explained treatment process to wife who expressed appreciation for information. [name] tolerated treatment without difficulty, denied questions concerns. I confirmed with them 0800 arrival tomorrow morning for H&N [facility], they voiced understanding to arrive to [facility] 1 for tmt after which they will be dirrected to [facility].  Keith Orem, RN, BSN, [place] [facility] Nurse [place] at [place] [phone]

## 2017-08-27 ENCOUNTER — Encounter: Payer: Self-pay | Admitting: *Deleted

## 2017-08-27 ENCOUNTER — Ambulatory Visit: Payer: PPO | Admitting: Physical Therapy

## 2017-08-27 ENCOUNTER — Ambulatory Visit
Admission: RE | Admit: 2017-08-27 | Discharge: 2017-08-27 | Disposition: A | Discharge status: Home / Self Care | Payer: PPO | Source: Ambulatory Visit | Attending: Radiation Oncology | Admitting: Radiation Oncology

## 2017-08-27 ENCOUNTER — Ambulatory Visit: Payer: PPO | Attending: Radiation Oncology

## 2017-08-27 ENCOUNTER — Ambulatory Visit: Payer: PPO | Admitting: Nutrition

## 2017-08-27 ENCOUNTER — Encounter: Payer: PPO | Admitting: Physical Therapy

## 2017-08-27 DIAGNOSIS — R1310 Dysphagia, unspecified: Secondary | ICD-10-CM | POA: Diagnosis not present

## 2017-08-27 DIAGNOSIS — C320 Malignant neoplasm of glottis: Secondary | ICD-10-CM | POA: Diagnosis not present

## 2017-08-27 DIAGNOSIS — R293 Abnormal posture: Secondary | ICD-10-CM | POA: Diagnosis not present

## 2017-08-27 DIAGNOSIS — C3411 Malignant neoplasm of upper lobe, right bronchus or lung: Secondary | ICD-10-CM | POA: Diagnosis not present

## 2017-08-27 DIAGNOSIS — Z9189 Other specified personal risk factors, not elsewhere classified: Secondary | ICD-10-CM | POA: Insufficient documentation

## 2017-08-27 DIAGNOSIS — R29898 Other symptoms and signs involving the musculoskeletal system: Secondary | ICD-10-CM | POA: Insufficient documentation

## 2017-08-27 NOTE — Therapy (Signed)
[facility] [address] [place], [place], [address] Phone: [phone]   Fax:  [phone]  Speech Language Pathology Evaluation  Patient Details  Name: [name] MRN: [id number] Date of Birth: 10/13/1951 Referring Provider: Tyler Pita, MD  Encounter Date: 08/27/2017      End of Session - 08/27/17 1042    Visit Number 1   Number of Visits 3   Date for SLP Re-Evaluation 11/01/17   SLP Start Time 0930   SLP Stop Time  1015   SLP Time Calculation (min) 45 min      Past Medical History:  Diagnosis Date  . Allergy   . Anemia   . Anginal pain ([place])   . Arthritis    "hands"  . Asthma   . Back pain    4 deteriorating disc and receives an injection q3-74mon;has been doing this for about 63yrs  . Blood transfusion    as a child  . Bronchitis   . Cancer ([place]) dx'd 2013   Non-small cell lung cancer  . Chronic kidney disease    acute kidney failure post surgery  . COPD (chronic obstructive pulmonary disease) (HCC)    uses Albuterol and Spiriva daily  . Coronary artery disease    has 1 stent  . Depression    takes Prozac daily  . Emphysema    sees Dr.Ramaswami for this  . Family history of adverse reaction to anesthesia    mother was hard to wake up sometimes  . GERD (gastroesophageal reflux disease)    takes Prilosec daily, EGD nml 06/2017  . Glaucoma    hx of  . H/O hiatal hernia   . Hypertension    takes  HYzaar daily  . Insomnia    takes Trazodone nightly  . Lung mass    right upper lobe  . Myocardial infarct ([place])   . 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 Obesity   . 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 Periodic limb movements of sleep   . Peripheral neuropathy   . Pneumonia    hx of' 66 yo,rd' last time in 2013  . Productive cough    white in color but no odor  . Shortness of breath    with exertion   . Sleep apnea    uses BiPaP; "no longer have sleep apnea since I have  lost over 100 lbs"  . Syncope and collapse 05/26/2013  . Thyroid disease   . Type II diabetes mellitus (HCC)    takes Metformin bid and Novolog and Lantus daily    Past Surgical History:  Procedure Laterality Date  . CARDIAC CATHETERIZATION  2006/2008/2009  . CARPAL TUNNEL RELEASE     bilateral  . COLONOSCOPY    . CORONARY ANGIOPLASTY WITH STENT PLACEMENT     1 stent  . ELBOW SURGERY     ulnar nerve; left  . ESOPHAGOGASTRODUODENOSCOPY    . EYE SURGERY     pt. denies  . FUDUCIAL PLACEMENT Left 08/24/2016   Procedure: PLACEMENT OF FUDUCIAL;  Surgeon: Keith Nakayama, MD;  Location: [facility];  Service: Thoracic;  Laterality: Left;  . INGUINAL HERNIA REPAIR  1990's   double inguinal  . LOBECTOMY  03/17/2012   upper right side with resection  . MICROLARYNGOSCOPY WITH CO2 LASER AND EXCISION OF VOCAL CORD LESION N/A 06/28/2017   Procedure: MICROLARYNGOSCOPY WITH BIOPSY;  Surgeon: Keith Quitter, MD;  Location: [facility];  Service: ENT;  Laterality: N/A;  . MICROLARYNGOSCOPY WITH CO2 LASER AND EXCISION OF VOCAL CORD LESION N/A 07/19/2017   Procedure: MICROLARYNGOSCOPY WITH CO2 LASER AND EXCISION OF VOCAL CORD LESION;  Surgeon: Keith Quitter, MD;  Location: [facility];  Service: ENT;  Laterality: N/A;  MICRO DIRECT LARYNGOSCOPY WITH CO2 LASER VOCAL CORD STRIPPING/POSS JET VENTURI VENTILATION  . REFRACTIVE SURGERY     bilaterally  . ROTATOR CUFF REPAIR     left  . VIDEO BRONCHOSCOPY WITH ENDOBRONCHIAL NAVIGATION N/A 08/24/2016   Procedure: VIDEO BRONCHOSCOPY WITH ENDOBRONCHIAL NAVIGATION;  Surgeon: Keith Nakayama, MD;  Location: [facility];  Service: Thoracic;  Laterality: N/A;    There were no vitals filed for this visit.      Subjective Assessment - 08/27/17 0948    Subjective Pt denies overt s/s aspiration - had sausage eggs, coffee this morning.   Patient is accompained by: Family member  wife            SLP Evaluation [facility] - 08/27/17 0930      SLP Visit Information   SLP  Received On 08/27/17   Referring Provider Keith Pita, MD   Onset Date September 2018   Medical Diagnosis rt vocal fold squamous cell CA     Pain Assessment   Currently in Pain? Yes   Pain Score 8    Pain Location Chest   Pain Orientation Left   Pain Frequency Constant   Pain Relieving Factors nothing     General Information   HPI Pt with complex medical history, History of lt lower lobe squamous cell CA in which pt completed rad in November 2017. New primary     Prior Functional Status   Cognitive/Linguistic Baseline Within functional limits     Cognition   Overall Cognitive Status Within Functional Limits for tasks assessed     Auditory Comprehension   Overall Auditory Comprehension Appears within functional limits for tasks assessed     Verbal Expression   Overall Verbal Expression Appears within functional limits for tasks assessed     Oral Motor/Sensory Function   Overall Oral Motor/Sensory Function Appears within functional limits for tasks assessed     Motor Speech   Overall Motor Speech Appears within functional limits for tasks assessed     Pt currently tolerates regular diet with thin liquids with top and bottom dentures. Spontaneous cough strength was WNL. POs: Pt ate [place] sandwith and drank H2O without overt s/s aspiration. Thyroid elevation appeared adequate, and swallows appeared timely. Oral residue noted as minimal. Pt's swallow deemed WNL at this time.   Because data states the risk for dysphagia during and after radiation treatment is high due to undergoing radiation tx, SLP taught pt about the possibility of reduced/limited ability for PO intake during rad tx.    SLP educated pt re: changes to swallowing musculature after rad tx, and why adherence to dysphagia HEP provided today and PO consumption was necessary to inhibit muscle fibrosis following rad tx. Pt demonstrated understanding of these things to SLP.    SLP then developed a HEP for pt and pt  was instructed how to perform exercises involving lingual, vocal, and pharyngeal strengthening. SLP performed each exercise and pt return demonstrated each exercise. SLP ensured pt performance was correct prior to moving on to next exercise. Pt was instructed to complete this program 2-3 times a day, until 6 months after his or her last rad tx, then x2-3 a week after that.  SLP Education - 09/04/17 1041    Education provided Yes   Education Details Late effects head/neck radiation on swallowing skills, HEP procedure, need to follow frequency and regimen, and # reps    Person(s) Educated Patient;Spouse   Methods Explanation;Handout   Comprehension Verbalized understanding;Returned demonstration;Verbal cues required;Need further instruction          SLP Short Term Goals - 04-Sep-2017 1053      SLP SHORT TERM GOAL #1   Title pt will complete HEP with rare min A    Time 1   Period --  visit   Status New     SLP SHORT TERM GOAL #2   Title pt will tell SLP why he is completing HEP    Time 1   Period --  visit   Status New     SLP SHORT TERM GOAL #3   Title pt will tell SLP 3 overt s/s aspiration PNA with modified independence   Time 1   Period --  visit   Status New          SLP Long Term Goals - 09/04/2017 1054      SLP LONG TERM GOAL #1   Title pt will complete HEP with modified independence over two sessions    Time 2   Period --  visits   Status New     SLP LONG TERM GOAL #2   Title pt will tell SLP why a food journal is helpful in returning to most liberal diet   Time 2   Period --  visits   Status New          Plan - 04-Sep-2017 1043    Clinical Impression Statement Pt with oropharyngeal swallowing essentially WNL, however the probability of swallowing difficulty increases dramatically with the initiation of radiation therapy. Pt will need to be followed by SLP for regular assessment of accurate HEP completion as well as for  safety with POs both during and following treatment/s.   Speech Therapy Frequency --  once approx every 4 weeks   Duration --  3 total visits   Treatment/Interventions Aspiration precaution training;Pharyngeal strengthening exercises;Diet toleration management by SLP;Compensatory techniques;Internal/external aids;SLP instruction and feedback;Patient/family education;Trials of upgraded texture/liquids   Potential to Achieve Goals Good      Patient will benefit from skilled therapeutic intervention in order to improve the following deficits and impairments:   Dysphagia, unspecified type      G-Codes - 09-04-17 1056    Functional Assessment Tool Used clincial judgment, NOMS   Functional Limitations Swallowing   Swallow Current Status (O1607) 0 percent impaired, limited or restricted   Swallow Goal Status (P7106) At least 1 percent but less than 20 percent impaired, limited or restricted      Problem List Patient Active Problem List   Diagnosis Date Noted  . Chronic sinusitis 07/04/2017  . 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 CAD (coronary artery disease), native coronary artery 09/18/2015  . Orthostatic hypotension 07/20/2015  . Smoking 02/26/2015  . Neurocardiogenic syncope 05/26/2013  . Seizures ([place]) 05/03/2013  . Chronic kidney disease   . Type II diabetes mellitus ([place])   . OSA on CPAP 09/26/2012  . Neuropathic pain of both legs 04/23/2012  . S/P lobectomy of lung 04/04/2012  . Non-small cell carcinoma of lung, stage 1 ([place]) 03/17/2012    Class: Stage 1  .  Chest pain, atypical 12/21/2011  . COPD exacerbation ([place]) 09/20/2011  . Hypertension   . Depression   . GERD (gastroesophageal reflux disease)   . CIGARETTE SMOKER 09/25/2007  . DIZZINESS AND GIDDINESS 09/25/2007  . Chronic obstructive pulmonary disease ([place]) 08/22/2007    [facility] ,[place], [place]  08/27/2017, 11:09 AM  [facility] [address] [place], [place], [address] Phone: [phone]   Fax:  [phone]  Name: [name] MRN: [id number] Date of Birth: May 02, 1951

## 2017-08-27 NOTE — Progress Notes (Signed)
Patient was seen during head and neck clinic.  66 year old male diagnosed with glottis cancer receiving radiation therapy.  He is a patient of Dr. Isidore Moos.  Past medical history includes lung cancer, Radiation therapy, chronic kidney disease, COPD, CAD, depression, GERD, hypertension, MI, type 2 diabetes.  Medications include Lipitor, Prozac, Lantus, multivitamin, Zantac.  Labs were reviewed.  Height: 5 feet 11 inches. Weight: 178.2 pounds on October 23. Patient weight approximately 300 pounds 2 years ago. BMI: 24.85.  Patient states all his weight loss was unintentional. Reports average blood sugars 130-140. Patient reports he likes "country food". States he does not eat a lot of meat but likes dried beans and peas. He has oral nutrition supplements that he is trying to drink daily. Current weight within a normal BMI range.  Nutrition diagnosis: Predicted suboptimal energy intake related to glottis cancer and associated treatments as evidenced by a condition for which research shows suboptimal energy intake.  Intervention: Patient educated to eat small frequent meals and snacks, high in protein. Encouraged portion control as needed to control blood sugars. Recommended patient drink 2 oral nutrition supplements daily. Provided samples of ensure. Coupons were given.  Fact sheets were provided.  Questions were answered.  Teach back method used.  And contact information was given.  Monitoring, evaluation, goals: Patient will tolerate adequate calories and protein to maintain weight.  Next visit: To be scheduled  **Disclaimer: This note was dictated with voice recognition software. Similar sounding words can inadvertently be transcribed and this note may contain transcription errors which may not have been corrected upon publication of note.**

## 2017-08-27 NOTE — Progress Notes (Signed)
Head & Neck Multidisciplinary Clinic Clinical Social Work  Clinical Social Work met with patient/family at head & neck multidisciplinary clinic to offer support and assess for psychosocial needs.  Patient was accompanied by his spouse.  Patient introduced himself as, "[name].  [name] shared he has already begun radiation treatments and has no concerns at this time, other than "getting it over with".  He was received radiation treatment before to the lung.  Patient's spouse was interested in completing healthcare advance directives, CSW encouraged patient/spouse to contact CSW to set up appointment when they are ready.  Patient lives in [place] and has identified no barriers to care at this time.  Clinical Social Work briefly discussed Clinical Social Work role and [facility] support programs/services.  Clinical Social Work encouraged patient to call with any additional questions or concerns.   Lauren Mullis Somers, MSW, LCSW, OSW-C Clinical Social Worker [facility] [phone] 

## 2017-08-27 NOTE — Patient Instructions (Addendum)
SWALLOWING EXERCISES Do these 6 of the 7 days per week until 6 months after your last day of radiation, then 2-3 times per week afterwards  1. Effortful Swallows - Press your tongue against the roof of your mouth for 3 seconds, then squeeze          the muscles in your neck while you swallow your saliva or a sip of water - Repeat 20 times, 2-3 times a day, and use whenever you eat or drink  2. Masako Swallow - swallow with your tongue sticking out - Stick tongue out past your teeth and gently bite tongue with your teeth - Swallow, while holding your tongue with your teeth - Repeat 20 times, 2-3 times a day *use a wet spoon if your mouth gets dry*  3. Pitch Raise - Repeat "he", once per second in as high of a pitch as you can - Repeat 20 times, 2-3 times a day  4. Mendelsohn Maneuver - "half swallow" exercise - Start to swallow, and keep your Adam's apple up by squeezing hard with the            muscles of the throat - Hold the squeeze for 5-7 seconds and then relax - Repeat 20 times, 2-3 times a day *use a wet spoon if your mouth gets dry*  5. Breath Hold - Say "HUH!" loudly, then hold your breath for 3 seconds at your voice box - Repeat 20 times, 2-3 times a day  6. Chin pushback - Open your mouth  - Place your fist UNDER your chin near your neck, and push back with your fist for 5 seconds - Repeat 10 times, 2-3 times a day       7.  "Siren" exercise  - say "ah" or "oh" at a low pitch (low note) and then raise your voice to as high of a pitch as you can. 10 times, 2-3 times a day

## 2017-08-27 NOTE — Therapy (Addendum)
[place], [place], [address] Phone: [phone]   Fax:  [phone]  Physical Therapy Evaluation  Patient Details  Name: [name] MRN: [id number] Date of Birth: 20-May-1951 Referring Provider: Dr. Tyler Pita  Encounter Date: 08/27/2017      PT End of Session - 08/27/17 0948    Visit Number 1   Number of Visits 1   PT Start Time 0853   PT Stop Time 0923   PT Time Calculation (min) 30 min   Activity Tolerance Patient tolerated treatment well   Behavior During Therapy [facility] for tasks assessed/performed      Past Medical History:  Diagnosis Date  . Allergy   . Anemia   . Anginal pain ([place])   . Arthritis    "hands"  . Asthma   . Back pain    4 deteriorating disc and receives an injection q3-24mon;has been doing this for about 51yrs  . Blood transfusion    as a child  . Bronchitis   . Cancer ([place]) dx'd 2013   Non-small cell lung cancer  . Chronic kidney disease    acute kidney failure post surgery  . COPD (chronic obstructive pulmonary disease) (HCC)    uses Albuterol and Spiriva daily  . Coronary artery disease    has 1 stent  . Depression    takes Prozac daily  . Emphysema    sees Dr.Ramaswami for this  . Family history of adverse reaction to anesthesia    mother was hard to wake up sometimes  . GERD (gastroesophageal reflux disease)    takes Prilosec daily, EGD nml 06/2017  . Glaucoma    hx of  . H/O hiatal hernia   . Hypertension    takes  HYzaar daily  . Insomnia    takes Trazodone nightly  . Lung mass    right upper lobe  . Myocardial infarct ([place])   . 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 Obesity   . 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 Periodic limb movements of sleep   . Peripheral neuropathy   . Pneumonia    hx of' 66 yo,rd' last time in 2013  . Productive cough    white in color but no odor  . Shortness of breath    with exertion   . Sleep apnea     uses BiPaP; "no longer have sleep apnea since I have lost over 100 lbs"  . Syncope and collapse 05/26/2013  . Thyroid disease   . Type II diabetes mellitus (HCC)    takes Metformin bid and Novolog and Lantus daily    Past Surgical History:  Procedure Laterality Date  . CARDIAC CATHETERIZATION  2006/2008/2009  . CARPAL TUNNEL RELEASE     bilateral  . COLONOSCOPY    . CORONARY ANGIOPLASTY WITH STENT PLACEMENT     1 stent  . ELBOW SURGERY     ulnar nerve; left  . ESOPHAGOGASTRODUODENOSCOPY    . EYE SURGERY     pt. denies  . FUDUCIAL PLACEMENT Left 08/24/2016   Procedure: PLACEMENT OF FUDUCIAL;  Surgeon: Melrose Nakayama, MD;  Location: [facility];  Service: Thoracic;  Laterality: Left;  . INGUINAL HERNIA REPAIR  1990's   double inguinal  . LOBECTOMY  03/17/2012   upper right side with resection  . MICROLARYNGOSCOPY WITH CO2 LASER AND EXCISION OF VOCAL CORD LESION N/A 06/28/2017   Procedure: MICROLARYNGOSCOPY WITH BIOPSY;  Surgeon: Melida Quitter,  MD;  Location: [facility];  Service: ENT;  Laterality: N/A;  . MICROLARYNGOSCOPY WITH CO2 LASER AND EXCISION OF VOCAL CORD LESION N/A 07/19/2017   Procedure: MICROLARYNGOSCOPY WITH CO2 LASER AND EXCISION OF VOCAL CORD LESION;  Surgeon: Melida Quitter, MD;  Location: [place];  Service: ENT;  Laterality: N/A;  MICRO DIRECT LARYNGOSCOPY WITH CO2 LASER VOCAL CORD STRIPPING/POSS JET VENTURI VENTILATION  . REFRACTIVE SURGERY     bilaterally  . ROTATOR CUFF REPAIR     left  . VIDEO BRONCHOSCOPY WITH ENDOBRONCHIAL NAVIGATION N/A 08/24/2016   Procedure: VIDEO BRONCHOSCOPY WITH ENDOBRONCHIAL NAVIGATION;  Surgeon: Melrose Nakayama, MD;  Location: [facility] [place];  Service: Thoracic;  Laterality: N/A;    There were no vitals filed for this visit.       Subjective Assessment - 08/27/17 0933    Subjective "I only do activity when I have to."   Patient is accompained by: Family member  wife   Pertinent History Pt. with right vocal cord squamous cell carcinoma,  and started CRT 08/26/17. He has a h/o non-small cell lung cancer with right upper lobectomy 03/17/12; he had left lower lobe lung cancer treated with SBRT 08/2016.  (He was seen by this therapist at multidisciplinary lung clinic at that time.) h/o MI with cardiac catheterization 2006, 2008, 2009.  Left rotator cuff repair with good outcome.  Arthritis in hands.  Back pain for which he gets injections.  HTN, DM type 2. COPD.   Patient Stated Goals Get info from all head & neck clinic providers.   Currently in Pain? Yes   Pain Score 8    Pain Location Chest   Pain Orientation Left   Pain Descriptors / Indicators Constant   Aggravating Factors  coughing   Pain Relieving Factors nothing            [facility] PT Assessment - 08/27/17 0001      Assessment   Medical Diagnosis right vocal cord squamous cell carcinoma   Referring Provider Dr. Tyler Pita   Onset Date/Surgical Date 08/05/17  approx.   Hand Dominance Right   Prior Therapy none     Precautions   Precautions Other (comment)   Precaution Comments cancer precautions     Restrictions   Weight Bearing Restrictions No     Balance Screen   Has the patient fallen in the past 6 months No   Has the patient had a decrease in activity level because of a fear of falling?  No   Is the patient reluctant to leave their home because of a fear of falling?  No     Home Environment   Living Environment Private residence   Living Arrangements Spouse/significant other   Type of [place] One level     Prior Function   Level of Independence Independent with basic ADLs   Leisure No regular exercise; just does chores when he needs to.     Cognition   Overall Cognitive Status Within Functional Limits for tasks assessed     Functional Tests   Functional tests Sit to Stand     Sit to Stand   Comments 12 times in 30 seconds, about average for age  he reported his quadriceps muscles were tired afterward      Posture/Postural Control   Posture/Postural Control Postural limitations   Postural Limitations Forward head     ROM / Strength   AROM / PROM / Strength AROM     AROM  AROM Assessment Site Cervical   Cervical Flexion WFL   Cervical Extension WFL   Cervical - Right Side Bend 50% loss   Cervical - Left Side Bend 50% loss   Cervical - Right Rotation 25% loss   Cervical - Left Rotation 25% loss     Ambulation/Gait   Ambulation/Gait Yes   Ambulation/Gait Assistance 7: Independent   Stairs --  says he gets SOB is > a few steps   Gait Comments Just observed him walking a few feet; he looked slightly unsteady.  Says he gets SOB with walking some distance.            LYMPHEDEMA/ONCOLOGY QUESTIONNAIRE - 08/27/17 0959      Type   Cancer Type squamous cell of vocal cord     Treatment   Active Radiation Treatment Yes   Date 08/26/17   Body Site neck     Lymphedema Assessments   Lymphedema Assessments Head and Neck     Head and Neck   4 cm superior to sternal notch around neck 39.5 cm   6 cm superior to sternal notch around neck 41 cm   8 cm superior to sternal notch around neck 42 cm   Other 44.3 cm. at 10 cm. superior to sternal notch         Objective measurements completed on examination: See above findings.                  PT Education - 08/27/17 0947    Education provided Yes   Education Details neck ROM, posture, breathing, walking, CURE article on staying active, lymphedema and PT info   Person(s) Educated Patient;Spouse   Methods Explanation;Handout   Comprehension Verbalized understanding                 Head and Neck Clinic Goals - 08/27/17 0955      Patient will be able to verbalize understanding of a home exercise program for cervical range of motion, posture, and walking.    Status Achieved     Patient will be able to verbalize understanding of proper sitting and standing posture.    Status Achieved     Patient  will be able to verbalize understanding of lymphedema risk and availability of treatment for this condition.    Status Achieved            Plan - 08/27/17 0948    Clinical Impression Statement This is a pleasant gentleman with squamous cell carcinoma of vocal cords.  He has 2x h/o lung cancer, once in 2013 when he underwent right upper lobectomy, and once in 2017 when he had left lower lobe SBRT treatment.  He and his wife report he has lost >100 lbs. without trying to lose weight.  He has forward head posture, limited neck ROM, and does not lead an active lifestyle.  He has a complex medical history.   History and Personal Factors relevant to plan of care: h/o lung cancer x 2, back pain, h/o left rotator cuff repair, DM, COPD   Clinical Presentation Evolving   Clinical Presentation due to: just starting XRT for head & neck cancer   Clinical Decision Making Moderate   Rehab Potential Good   PT Frequency One time visit   PT Treatment/Interventions Patient/family education   PT Next Visit Plan None planned at this time.  He may need therapy going forward should lylmphedema develop.   PT Home Exercise Plan walking, neck ROM   Consulted  and Agree with Plan of Care Patient      Patient will benefit from skilled therapeutic intervention in order to improve the following deficits and impairments:  Decreased range of motion, Pain, Cardiopulmonary status limiting activity, Postural dysfunction  Visit Diagnosis: Abnormal posture - Plan: PT plan of care cert/re-cert  Other symptoms and signs involving the musculoskeletal system - Plan: PT plan of care cert/re-cert  At risk for lymphedema - Plan: PT plan of care cert/re-cert  Squamous cell carcinoma of vocal cord ([place]) - Plan: PT plan of care cert/re-cert      G-Codes - 05/39/76 0955    Functional Assessment Tool Used (Outpatient Only) clinical judgement   Functional Limitation Changing and maintaining body position  posture, neck  ROM   Changing and Maintaining Body Position Current Status (B3419) At least 20 percent but less than 40 percent impaired, limited or restricted   Changing and Maintaining Body Position Goal Status (F7902) At least 20 percent but less than 40 percent impaired, limited or restricted   Changing and Maintaining Body Position Discharge Status (I0973) At least 20 percent but less than 40 percent impaired, limited or restricted       Problem List Patient Active Problem List   Diagnosis Date Noted  . Chronic sinusitis 07/04/2017  . 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 CAD (coronary artery disease), native coronary artery 09/18/2015  . Orthostatic hypotension 07/20/2015  . Smoking 02/26/2015  . Neurocardiogenic syncope 05/26/2013  . Seizures ([place]) 05/03/2013  . Chronic kidney disease   . Type II diabetes mellitus ([place])   . OSA on CPAP 09/26/2012  . Neuropathic pain of both legs 04/23/2012  . S/P lobectomy of lung 04/04/2012  . Non-small cell carcinoma of lung, stage 1 ([place]) 03/17/2012    Class: Stage 1  . Chest pain, atypical 12/21/2011  . COPD exacerbation ([place]) 09/20/2011  . Hypertension   . Depression   . GERD (gastroesophageal reflux disease)   . CIGARETTE SMOKER 09/25/2007  . DIZZINESS AND GIDDINESS 09/25/2007  . Chronic obstructive pulmonary disease ([place]) 08/22/2007    SALISBURY,DONNA 08/27/2017, 10:01 AM  [place], [place], [address] Phone: [phone]   Fax:  [phone]  Name: [name] MRN: [id number] Date of Birth: Jun 04, 1951  Serafina Royals, PT 08/27/17 10:01 AM

## 2017-08-28 ENCOUNTER — Ambulatory Visit
Admission: RE | Admit: 2017-08-28 | Discharge: 2017-08-28 | Disposition: A | Discharge status: Home / Self Care | Payer: PPO | Source: Ambulatory Visit | Attending: Radiation Oncology | Admitting: Radiation Oncology

## 2017-08-28 DIAGNOSIS — C320 Malignant neoplasm of glottis: Secondary | ICD-10-CM

## 2017-08-28 DIAGNOSIS — C3411 Malignant neoplasm of upper lobe, right bronchus or lung: Secondary | ICD-10-CM | POA: Diagnosis not present

## 2017-08-28 MED ORDER — BIAFINE EX EMUL: Freq: Every day | CUTANEOUS | Status: DC

## 2017-08-29 ENCOUNTER — Ambulatory Visit
Admission: RE | Admit: 2017-08-29 | Discharge: 2017-08-29 | Disposition: A | Discharge status: Home / Self Care | Payer: PPO | Source: Ambulatory Visit | Attending: Radiation Oncology | Admitting: Radiation Oncology

## 2017-08-29 DIAGNOSIS — C3411 Malignant neoplasm of upper lobe, right bronchus or lung: Secondary | ICD-10-CM | POA: Diagnosis not present

## 2017-08-29 DIAGNOSIS — C320 Malignant neoplasm of glottis: Secondary | ICD-10-CM | POA: Diagnosis not present

## 2017-08-29 NOTE — Progress Notes (Signed)
Oncology Nurse Navigator Documentation  Met with Mr. [place] during [facility] [place].  He was accompanied by his wife.  Provided verbal and written overview of [place], the clinicians who will be seeing him, encouraged him to ask questions during his time with them.  He was seen by Nutrition, SLP, PT, SW and [place].  Spoke with him at end of [facility], addressed questions. I encouraged him to call me with needs/concerns.  Gayleen Orem, RN, BSN, [place] at [place] [phone]

## 2017-08-30 ENCOUNTER — Ambulatory Visit
Admission: RE | Admit: 2017-08-30 | Discharge: 2017-08-30 | Disposition: A | Discharge status: Home / Self Care | Payer: PPO | Source: Ambulatory Visit | Attending: Radiation Oncology | Admitting: Radiation Oncology

## 2017-08-30 DIAGNOSIS — C320 Malignant neoplasm of glottis: Secondary | ICD-10-CM | POA: Diagnosis not present

## 2017-08-30 DIAGNOSIS — C3411 Malignant neoplasm of upper lobe, right bronchus or lung: Secondary | ICD-10-CM | POA: Diagnosis not present

## 2017-09-02 ENCOUNTER — Ambulatory Visit
Admission: RE | Admit: 2017-09-02 | Discharge: 2017-09-02 | Disposition: A | Discharge status: Home / Self Care | Payer: PPO | Source: Ambulatory Visit | Attending: Radiation Oncology | Admitting: Radiation Oncology

## 2017-09-02 DIAGNOSIS — C320 Malignant neoplasm of glottis: Secondary | ICD-10-CM | POA: Diagnosis not present

## 2017-09-02 DIAGNOSIS — C3411 Malignant neoplasm of upper lobe, right bronchus or lung: Secondary | ICD-10-CM | POA: Diagnosis not present

## 2017-09-03 ENCOUNTER — Ambulatory Visit
Admission: RE | Admit: 2017-09-03 | Discharge: 2017-09-03 | Disposition: A | Discharge status: Home / Self Care | Payer: PPO | Source: Ambulatory Visit | Attending: Radiation Oncology | Admitting: Radiation Oncology

## 2017-09-03 DIAGNOSIS — C3411 Malignant neoplasm of upper lobe, right bronchus or lung: Secondary | ICD-10-CM | POA: Diagnosis not present

## 2017-09-03 DIAGNOSIS — C320 Malignant neoplasm of glottis: Secondary | ICD-10-CM | POA: Diagnosis not present

## 2017-09-04 ENCOUNTER — Ambulatory Visit
Admission: RE | Admit: 2017-09-04 | Discharge: 2017-09-04 | Disposition: A | Discharge status: Home / Self Care | Payer: PPO | Source: Ambulatory Visit | Attending: Radiation Oncology | Admitting: Radiation Oncology

## 2017-09-04 DIAGNOSIS — C320 Malignant neoplasm of glottis: Secondary | ICD-10-CM | POA: Diagnosis not present

## 2017-09-04 DIAGNOSIS — C3411 Malignant neoplasm of upper lobe, right bronchus or lung: Secondary | ICD-10-CM | POA: Diagnosis not present

## 2017-09-05 ENCOUNTER — Ambulatory Visit
Admission: RE | Admit: 2017-09-05 | Discharge: 2017-09-05 | Disposition: A | Discharge status: Home / Self Care | Payer: PPO | Source: Ambulatory Visit | Attending: Radiation Oncology | Admitting: Radiation Oncology

## 2017-09-05 DIAGNOSIS — C320 Malignant neoplasm of glottis: Secondary | ICD-10-CM | POA: Diagnosis not present

## 2017-09-05 DIAGNOSIS — C3411 Malignant neoplasm of upper lobe, right bronchus or lung: Secondary | ICD-10-CM | POA: Diagnosis not present

## 2017-09-06 ENCOUNTER — Ambulatory Visit: Payer: PPO

## 2017-09-09 ENCOUNTER — Ambulatory Visit
Admission: RE | Admit: 2017-09-09 | Discharge: 2017-09-09 | Disposition: A | Discharge status: Home / Self Care | Payer: PPO | Source: Ambulatory Visit | Attending: Radiation Oncology | Admitting: Radiation Oncology

## 2017-09-09 ENCOUNTER — Other Ambulatory Visit: Payer: Self-pay | Admitting: Radiation Oncology

## 2017-09-09 DIAGNOSIS — J418 Mixed simple and mucopurulent chronic bronchitis: Secondary | ICD-10-CM

## 2017-09-09 DIAGNOSIS — C3411 Malignant neoplasm of upper lobe, right bronchus or lung: Secondary | ICD-10-CM

## 2017-09-09 DIAGNOSIS — C320 Malignant neoplasm of glottis: Secondary | ICD-10-CM | POA: Diagnosis not present

## 2017-09-09 MED ORDER — SUCRALFATE 1 G PO TABS
1.0000 g | ORAL_TABLET | Freq: Three times a day (TID) | ORAL | 2 refills | Status: DC
Start: 2017-09-09 — End: 2018-08-14

## 2017-09-09 MED ORDER — LEVOFLOXACIN 750 MG PO TABS: 750.0000 mg | ORAL_TABLET | Freq: Every day | ORAL | 0 refills | Status: DC

## 2017-09-09 MED ORDER — HYDROCODONE-ACETAMINOPHEN 7.5-325 MG/15ML PO SOLN: 10.0000 mL | ORAL | 0 refills | Status: DC | PRN

## 2017-09-10 ENCOUNTER — Ambulatory Visit: Payer: PPO

## 2017-09-10 ENCOUNTER — Telehealth: Payer: Self-pay | Admitting: Internal Medicine

## 2017-09-10 ENCOUNTER — Ambulatory Visit
Admission: RE | Admit: 2017-09-10 | Discharge: 2017-09-10 | Disposition: A | Discharge status: Home / Self Care | Payer: PPO | Source: Ambulatory Visit | Attending: Radiation Oncology | Admitting: Radiation Oncology

## 2017-09-10 ENCOUNTER — Other Ambulatory Visit: Payer: Self-pay | Admitting: *Deleted

## 2017-09-10 ENCOUNTER — Telehealth: Payer: Self-pay | Admitting: Radiation Oncology

## 2017-09-10 ENCOUNTER — Ambulatory Visit: Admission: RE | Admit: 2017-09-10 | Payer: PPO | Source: Ambulatory Visit

## 2017-09-10 DIAGNOSIS — C3411 Malignant neoplasm of upper lobe, right bronchus or lung: Secondary | ICD-10-CM | POA: Diagnosis not present

## 2017-09-10 DIAGNOSIS — C320 Malignant neoplasm of glottis: Secondary | ICD-10-CM | POA: Diagnosis not present

## 2017-09-10 NOTE — Telephone Encounter (Signed)
Looked at patient's current medication list, I do not see any inhalers. I looked at a past medication list from April 2018 and did see where the patient was using Stiolto.   MR, since the Stiolto is not currently on his medication list, please advise if you are ok with [place] giving this sample. Thanks!

## 2017-09-10 NOTE — Progress Notes (Signed)
Nutrition Follow-up:  Patient with glottis cancer receiving radiation therapy.  Patient is followed by Dr. Tammi Klippel.  Patient seen following radiation therapy this am with wife.  Patient reports that last week and over the weekend has experienced increased pain on swallowing and scratchy throat.  Patient reports that he has not been eating well due to pain.  Wife reports patient saw PA yesterday and started antibiotic due to wheezing and added carafate and hydrocodone. Wife reports PA wants patient to get a chest xray and lab work but he has not done it yet.  Patient reports those medications have helped him.  Reports has been drinking equate shakes 2 per day, coffee and soda. Reports eating some eggs, gravy 1/2 biscuit, milkshakes.  Does not really like soups.  Patient reports "I have not eaten because it hurts to bad."  Patient does report some nausea but resolved.  Also reports episode of diarrhea that was controlled with diarrhea.    Medications: hycet, levaquin, carafate  Labs: reviewed  Anthropometrics:   Wife reports weighed yesterday in radiation exam room and was 168 lb.  Noted last weight on 10/23 of 178 lb  5% weight loss in the last 2 weeks, significant.  Patient reports new jeans he can't keep up.   NUTRITION DIAGNOSIS: Predicted suboptimal energy intake continues   MALNUTRITION DIAGNOSIS: Patient meets criteria for severe malnutrition in context of acute illness as evidenced by 5% weight loss in 2 weeks and eating < or equal to 50% of energy needs for > or equal to 5 days.    INTERVENTION:   Encouraged soft moist protein foods and handout with examples provided.   Discussed smooth, creamy, liquid foods for patient to incorporate into diet.   Encouraged patient to drink 3-4 oral nutrition supplement per day to better meet nutritional needs.   Patient given 1st case of complimentary ensure plus today.  Discussed ways to increase calories and protein in shakes.     MONITORING, EVALUATION, GOAL: Patient will tolerate adequate calories and protein to maintain weight   NEXT VISIT: Tuesday, Nov 13 after radiation  Seif Teichert B. Zenia Resides, [place], [place] Registered Dietitian [phone] (pager)

## 2017-09-10 NOTE — Telephone Encounter (Signed)
Shona Simpson, PA-C requested this RN phone the patient and stress the importance of lab work and chest xray to rule out pneumonia immediately following radiation treatment tomorrow at [facility]. No answer on patient's cell or home phone. Left message on both. I stressed the importance of lab work and chest xray to rule out pneumonia. Reassured patient orders are in. Thanked patient for attending nutrition appointment today. Left contact information should patient need to phone this RN back with future needs or questions.

## 2017-09-11 ENCOUNTER — Ambulatory Visit
Admission: RE | Admit: 2017-09-11 | Discharge: 2017-09-11 | Disposition: A | Discharge status: Home / Self Care | Payer: PPO | Source: Ambulatory Visit | Attending: Radiation Oncology | Admitting: Radiation Oncology

## 2017-09-11 ENCOUNTER — Ambulatory Visit (HOSPITAL_COMMUNITY)
Admission: RE | Admit: 2017-09-11 | Discharge: 2017-09-11 | Disposition: A | Discharge status: Home / Self Care | Payer: PPO | Source: Ambulatory Visit | Attending: Radiation Oncology | Admitting: Radiation Oncology

## 2017-09-11 DIAGNOSIS — C320 Malignant neoplasm of glottis: Secondary | ICD-10-CM

## 2017-09-11 DIAGNOSIS — C3411 Malignant neoplasm of upper lobe, right bronchus or lung: Secondary | ICD-10-CM | POA: Diagnosis not present

## 2017-09-11 DIAGNOSIS — J418 Mixed simple and mucopurulent chronic bronchitis: Secondary | ICD-10-CM | POA: Insufficient documentation

## 2017-09-11 DIAGNOSIS — R05 Cough: Secondary | ICD-10-CM | POA: Diagnosis not present

## 2017-09-11 DIAGNOSIS — I700 Atherosclerosis of aorta: Secondary | ICD-10-CM | POA: Insufficient documentation

## 2017-09-11 DIAGNOSIS — R918 Other nonspecific abnormal finding of lung field: Secondary | ICD-10-CM | POA: Diagnosis not present

## 2017-09-11 LAB — COMPREHENSIVE METABOLIC PANEL
ALK PHOS: 106 U/L (ref 40–150)
ALT: 53 U/L (ref 0–55)
ANION GAP: 11 meq/L (ref 3–11)
AST: 26 U/L (ref 5–34)
Albumin: 2.3 g/dL — ABNORMAL LOW (ref 3.5–5.0)
BUN: 35.4 mg/dL — AB (ref 7.0–26.0)
CALCIUM: 9.2 mg/dL (ref 8.4–10.4)
CO2: 21 mEq/L — ABNORMAL LOW (ref 22–29)
CREATININE: 1.5 mg/dL — AB (ref 0.7–1.3)
Chloride: 107 mEq/L (ref 98–109)
EGFR: 46 mL/min/{1.73_m2} — ABNORMAL LOW (ref >60)
Glucose: 236 mg/dl — ABNORMAL HIGH (ref 70–140)
Potassium: 4.5 mEq/L (ref 3.5–5.1)
Sodium: 139 mEq/L (ref 136–145)
Total Bilirubin: <0.22 mg/dL (ref 0.20–1.20)
Total Protein: 6.7 g/dL (ref 6.4–8.3)

## 2017-09-11 LAB — CBC WITH DIFFERENTIAL/PLATELET
BASO%: 0.1 % (ref 0.0–2.0)
Basophils Absolute: 0 10*3/uL (ref 0.0–0.1)
EOS%: 1.4 % (ref 0.0–7.0)
Eosinophils Absolute: 0.2 10*3/uL (ref 0.0–0.5)
HCT: 30.5 % — ABNORMAL LOW (ref 38.4–49.9)
HGB: 10.2 g/dL — ABNORMAL LOW (ref 13.0–17.1)
LYMPH%: 8.4 % — AB (ref 14.0–49.0)
MCH: 31.5 pg (ref 27.2–33.4)
MCHC: 33.4 g/dL (ref 32.0–36.0)
MCV: 94.1 fL (ref 79.3–98.0)
MONO#: 0.7 10*3/uL (ref 0.1–0.9)
MONO%: 6 % (ref 0.0–14.0)
NEUT%: 84.1 % — ABNORMAL HIGH (ref 39.0–75.0)
NEUTROS ABS: 10.4 10*3/uL — AB (ref 1.5–6.5)
Platelets: 299 10*3/uL (ref 140–400)
RBC: 3.24 10*6/uL — AB (ref 4.20–5.82)
RDW: 13.6 % (ref 11.0–14.6)
WBC: 12.4 10*3/uL — AB (ref 4.0–10.3)
lymph#: 1 10*3/uL (ref 0.9–3.3)

## 2017-09-11 NOTE — Telephone Encounter (Signed)
stiolto is fine - lower dose, give 2-3 samples and send scrip and add to med list  Dr. Brand Males, M.D., [facility].C.P Pulmonary and Critical Care Medicine Staff Physician [facility] Pager: [phone], If no answer or between  15:00h - 7:00h: call [phone]  09/11/2017 11:36 AM

## 2017-09-11 NOTE — Telephone Encounter (Signed)
Called pt and advised message from the provider. Pt understood and verbalized understanding. Nothing further is needed.    Samples left up front. For pt to pick up. I did not send in RX because she wanted me to hold off until they see MR again because this may be a high tier medication.

## 2017-09-11 NOTE — Telephone Encounter (Signed)
Pt wife here to pick up husbands inhaler, please advise.Hillery Hunter

## 2017-09-11 NOTE — Telephone Encounter (Signed)
Patient's wife stopped by the office to check on status of inhalers. Advised her that the medication is not his list and we need confirmation from MR on the strength of Stiolto. We are still waiting on MR to respond thus why we have not called her back.   She verbalized understanding.   MR, please advise on what strength he needs. Thanks.

## 2017-09-12 ENCOUNTER — Ambulatory Visit
Admission: RE | Admit: 2017-09-12 | Discharge: 2017-09-12 | Disposition: A | Discharge status: Home / Self Care | Payer: PPO | Source: Ambulatory Visit | Attending: Radiation Oncology | Admitting: Radiation Oncology

## 2017-09-12 DIAGNOSIS — C320 Malignant neoplasm of glottis: Secondary | ICD-10-CM | POA: Diagnosis not present

## 2017-09-12 DIAGNOSIS — C3411 Malignant neoplasm of upper lobe, right bronchus or lung: Secondary | ICD-10-CM | POA: Diagnosis not present

## 2017-09-13 ENCOUNTER — Other Ambulatory Visit: Payer: Self-pay | Admitting: Radiation Oncology

## 2017-09-13 ENCOUNTER — Ambulatory Visit
Admission: RE | Admit: 2017-09-13 | Discharge: 2017-09-13 | Disposition: A | Discharge status: Home / Self Care | Payer: PPO | Source: Ambulatory Visit | Attending: Radiation Oncology | Admitting: Radiation Oncology

## 2017-09-13 DIAGNOSIS — C3411 Malignant neoplasm of upper lobe, right bronchus or lung: Secondary | ICD-10-CM | POA: Diagnosis not present

## 2017-09-13 DIAGNOSIS — J189 Pneumonia, unspecified organism: Secondary | ICD-10-CM

## 2017-09-13 DIAGNOSIS — C320 Malignant neoplasm of glottis: Secondary | ICD-10-CM | POA: Diagnosis not present

## 2017-09-16 ENCOUNTER — Ambulatory Visit
Admission: RE | Admit: 2017-09-16 | Discharge: 2017-09-16 | Disposition: A | Discharge status: Home / Self Care | Payer: PPO | Source: Ambulatory Visit | Attending: Radiation Oncology | Admitting: Radiation Oncology

## 2017-09-16 DIAGNOSIS — C320 Malignant neoplasm of glottis: Secondary | ICD-10-CM | POA: Diagnosis not present

## 2017-09-16 DIAGNOSIS — C3411 Malignant neoplasm of upper lobe, right bronchus or lung: Secondary | ICD-10-CM | POA: Diagnosis not present

## 2017-09-17 ENCOUNTER — Ambulatory Visit
Admission: RE | Admit: 2017-09-17 | Discharge: 2017-09-17 | Disposition: A | Discharge status: Home / Self Care | Payer: PPO | Source: Ambulatory Visit | Attending: Radiation Oncology | Admitting: Radiation Oncology

## 2017-09-17 ENCOUNTER — Ambulatory Visit: Payer: PPO

## 2017-09-17 DIAGNOSIS — C320 Malignant neoplasm of glottis: Secondary | ICD-10-CM | POA: Diagnosis not present

## 2017-09-17 DIAGNOSIS — C3411 Malignant neoplasm of upper lobe, right bronchus or lung: Secondary | ICD-10-CM | POA: Diagnosis not present

## 2017-09-17 NOTE — Progress Notes (Signed)
Nutrition Follow-up:  Patient with glottis cancer receiving radiation therapy.  Patient is followed by Dr. Tammi Klippel.  Patient seen with wife following radiation therapy this am.  Patient reports that he is eating better than last week and feeling a little bit better.  Wife reports that he has pneumonia and continues on antibiotic.  Reports that breakfast foods are his best foods to get down. Reports he usually has eggs, grits, 2 slices of bacon, toast yesterday, ate pizza for lunch and 2 hotdogs with chili last night for dinner.  Reports that he is trying to drink 2-3 equate plus shakes per day, mostly 2.  Has also tried peanut butter sandwich recently and tasted good.    Wife reports carfate and hycet have really helped patient.  No other nutrition impact symptoms reported today.   Medications: reviewed  Labs: reviewed  Anthropometrics:   No new weight taken this week per wife.  Reported weight last week (11/5) of 168 lb.  Noted last weight on 10/23 of 178 lb.   NUTRITION DIAGNOSIS: predicted suboptimal energy intake improving   MALNUTRITION DIAGNOSIS: Severe malnutrition continues   INTERVENTION:   Encouraged patient to consume 3-4 oral nutrition supplements per day to better meet nutritional needs.   Encouraged small frequent meals.      MONITORING, EVALUATION, GOAL: Patient will tolerate adequate calories and protein to maintain weight   NEXT VISIT: Tuesday, November 20 following radiation  Janet Decesare B. Zenia Resides, [place], [place] Registered Dietitian [phone] (pager)

## 2017-09-18 ENCOUNTER — Telehealth: Payer: Self-pay | Admitting: Radiation Oncology

## 2017-09-18 ENCOUNTER — Ambulatory Visit
Admission: RE | Admit: 2017-09-18 | Discharge: 2017-09-18 | Disposition: A | Discharge status: Home / Self Care | Payer: PPO | Source: Ambulatory Visit | Attending: Urology | Admitting: Urology

## 2017-09-18 ENCOUNTER — Ambulatory Visit
Admission: RE | Admit: 2017-09-18 | Discharge: 2017-09-18 | Disposition: A | Discharge status: Home / Self Care | Payer: PPO | Source: Ambulatory Visit | Attending: Radiation Oncology | Admitting: Radiation Oncology

## 2017-09-18 DIAGNOSIS — J189 Pneumonia, unspecified organism: Secondary | ICD-10-CM

## 2017-09-18 DIAGNOSIS — C320 Malignant neoplasm of glottis: Secondary | ICD-10-CM | POA: Diagnosis not present

## 2017-09-18 DIAGNOSIS — C3411 Malignant neoplasm of upper lobe, right bronchus or lung: Secondary | ICD-10-CM | POA: Diagnosis not present

## 2017-09-18 LAB — CBC WITH DIFFERENTIAL/PLATELET
BASO%: 0.6 % (ref 0.0–2.0)
BASOS ABS: 0.1 10*3/uL (ref 0.0–0.1)
EOS ABS: 0.2 10*3/uL (ref 0.0–0.5)
EOS%: 1.7 % (ref 0.0–7.0)
HEMATOCRIT: 31.9 % — AB (ref 38.4–49.9)
HEMOGLOBIN: 10.7 g/dL — AB (ref 13.0–17.1)
LYMPH#: 1.4 10*3/uL (ref 0.9–3.3)
LYMPH%: 12.4 % — ABNORMAL LOW (ref 14.0–49.0)
MCH: 31.9 pg (ref 27.2–33.4)
MCHC: 33.6 g/dL (ref 32.0–36.0)
MCV: 94.8 fL (ref 79.3–98.0)
MONO#: 0.7 10*3/uL (ref 0.1–0.9)
MONO%: 6.4 % (ref 0.0–14.0)
NEUT%: 78.9 % — AB (ref 39.0–75.0)
NEUTROS ABS: 8.9 10*3/uL — AB (ref 1.5–6.5)
PLATELETS: 336 10*3/uL (ref 140–400)
RBC: 3.37 10*6/uL — ABNORMAL LOW (ref 4.20–5.82)
RDW: 13.8 % (ref 11.0–14.6)
WBC: 11.3 10*3/uL — ABNORMAL HIGH (ref 4.0–10.3)

## 2017-09-18 NOTE — Telephone Encounter (Signed)
As requested by Shona Simpson, PA-C I phoned the patient and explained his WBC are coming down and future lab work reference this matter are not needed. Wife questions if another round of antibiotics are in order. Explained since his WBCs are coming down they are not. She request another round because of her husband's "history and that it normally takes two rounds to get it to go away." Both understand this RN will inform Shona Simpson, PA-C of these findings and phone back if there are further directions.

## 2017-09-18 NOTE — Telephone Encounter (Signed)
If he's still having fevers and clinically worsening, but if not that I wouldn't give another course. I would actually change to something different if he didn't respond to levaquin.

## 2017-09-18 NOTE — Telephone Encounter (Signed)
Phoned patient back. Explained that unless he begins to run a fever or has a return of symptoms Bryson Ha does not wish to order another round of antibiotics. Stressed if any changes such as these occur to contact this RN asap. Patient and wife verbalized understanding.

## 2017-09-19 ENCOUNTER — Ambulatory Visit: Payer: PPO | Attending: Radiation Oncology

## 2017-09-19 ENCOUNTER — Other Ambulatory Visit: Payer: Self-pay

## 2017-09-19 ENCOUNTER — Ambulatory Visit
Admission: RE | Admit: 2017-09-19 | Discharge: 2017-09-19 | Disposition: A | Discharge status: Home / Self Care | Payer: PPO | Source: Ambulatory Visit | Attending: Radiation Oncology | Admitting: Radiation Oncology

## 2017-09-19 DIAGNOSIS — R1310 Dysphagia, unspecified: Secondary | ICD-10-CM | POA: Diagnosis not present

## 2017-09-19 DIAGNOSIS — C320 Malignant neoplasm of glottis: Secondary | ICD-10-CM | POA: Diagnosis not present

## 2017-09-19 DIAGNOSIS — C3411 Malignant neoplasm of upper lobe, right bronchus or lung: Secondary | ICD-10-CM | POA: Diagnosis not present

## 2017-09-19 NOTE — Therapy (Signed)
[facility] [address] [place], [place], [address] Phone: [phone]   Fax:  [phone]  Speech Language Pathology Treatment  Patient Details  Name: [name] MRN: [id number] Date of Birth: 1951-10-13 Referring Provider: Tyler Pita, MD   Encounter Date: 09/19/2017  End of Session - 09/19/17 1728    Visit Number  2    Number of Visits  3    Date for SLP Re-Evaluation  11/01/17    SLP Start Time  36    SLP Stop Time   1400    SLP Time Calculation (min)  41 min    Activity Tolerance  Patient tolerated treatment well       Past Medical History:  Diagnosis Date  . Allergy   . Anemia   . Anginal pain ([place])   . Arthritis    "hands"  . Asthma   . Back pain    4 deteriorating disc and receives an injection q3-44mon;has been doing this for about 53yrs  . Blood transfusion    as a child  . Bronchitis   . Cancer ([employer]) dx'd 2013   Non-small cell lung cancer  . Chronic kidney disease    acute kidney failure post surgery  . COPD (chronic obstructive pulmonary disease) (HCC)    uses Albuterol and Spiriva daily  . Coronary artery disease    has 1 stent  . Depression    takes Prozac daily  . Emphysema    sees Dr.Ramaswami for this  . Family history of adverse reaction to anesthesia    mother was hard to wake up sometimes  . GERD (gastroesophageal reflux disease)    takes Prilosec daily, EGD nml 06/2017  . Glaucoma    hx of  . H/O hiatal hernia   . Hypertension    takes  HYzaar daily  . Insomnia    takes Trazodone nightly  . Lung mass    right upper lobe  . Myocardial infarct ([place])   . 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 Obesity   . 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 Periodic limb movements of sleep   . Peripheral neuropathy   . Pneumonia    hx of' 66 yo,rd' last time in 2013  . Productive cough    white in color but no odor  . Shortness of breath    with exertion   . Sleep  apnea    uses BiPaP; "no longer have sleep apnea since I have lost over 100 lbs"  . Syncope and collapse 05/26/2013  . Thyroid disease   . Type II diabetes mellitus (HCC)    takes Metformin bid and Novolog and Lantus daily    Past Surgical History:  Procedure Laterality Date  . CARDIAC CATHETERIZATION  2006/2008/2009  . CARPAL TUNNEL RELEASE     bilateral  . COLONOSCOPY    . CORONARY ANGIOPLASTY WITH STENT PLACEMENT     1 stent  . ELBOW SURGERY     ulnar nerve; left  . ESOPHAGOGASTRODUODENOSCOPY    . EYE SURGERY     pt. denies  . FUDUCIAL PLACEMENT Left 08/24/2016   Procedure: PLACEMENT OF FUDUCIAL;  Surgeon: Melrose Nakayama, MD;  Location: [facility];  Service: Thoracic;  Laterality: Left;  . INGUINAL HERNIA REPAIR  1990's   double inguinal  . LOBECTOMY  03/17/2012   upper right side with resection  . MICROLARYNGOSCOPY WITH CO2 LASER AND EXCISION OF VOCAL CORD LESION N/A  06/28/2017   Procedure: MICROLARYNGOSCOPY WITH BIOPSY;  Surgeon: Melida Quitter, MD;  Location: [facility];  Service: ENT;  Laterality: N/A;  . MICROLARYNGOSCOPY WITH CO2 LASER AND EXCISION OF VOCAL CORD LESION N/A 07/19/2017   Procedure: MICROLARYNGOSCOPY WITH CO2 LASER AND EXCISION OF VOCAL CORD LESION;  Surgeon: Melida Quitter, MD;  Location: [facility];  Service: ENT;  Laterality: N/A;  MICRO DIRECT LARYNGOSCOPY WITH CO2 LASER VOCAL CORD STRIPPING/POSS JET VENTURI VENTILATION  . REFRACTIVE SURGERY     bilaterally  . ROTATOR CUFF REPAIR     left  . VIDEO BRONCHOSCOPY WITH ENDOBRONCHIAL NAVIGATION N/A 08/24/2016   Procedure: VIDEO BRONCHOSCOPY WITH ENDOBRONCHIAL NAVIGATION;  Surgeon: Melrose Nakayama, MD;  Location: [facility];  Service: Thoracic;  Laterality: N/A;    There were no vitals filed for this visit.  Subjective Assessment - 09/19/17 1322    Subjective  "I had double pneumonia." Possibility of aspiration PNA. Pt finished 18/28 today.    Patient is accompained by:  -- wife            ADULT SLP TREATMENT  - 09/19/17 1329      General Information   Behavior/Cognition  Alert;Cooperative;Pleasant mood      Treatment Provided   Treatment provided  Dysphagia      Dysphagia Treatment   Temperature Spikes Noted  No    Respiratory Status  Room air    Oral Cavity - Dentition  Dentures, top;Dentures, bottom    Treatment Methods  Skilled observation;Therapeutic exercise;Compensation strategy training;Patient/caregiver education    Patient observed directly with PO's  Yes    Type of PO's observed  Dysphagia 3 (soft);Thin liquids    Oral Phase Signs & Symptoms  -- no overt signs    Pharyngeal Phase Signs & Symptoms  -- no overt s/s -- pt coughed clear saliva    Other treatment/comments  Pt told SLP why he is completing HEP, correctly. Grits, sausage, gravy, french toast, crisp bacon and 3-4 Ensures/day. He reports the only thing that makes him cough is cold coffee. Pt drinks this from the carafe with multiple sips. SLP told pt to drink all liquids single sips and demonstrated for pt. Pt ate cereal bar and drank coffee.  With HEP pt req'd min A, rarely. SLP provided pt/wife with handout of overt s/s aspiration PNA. SLP told pt/wife with these signs contact MD immediately.       Assessment / Recommendations / Plan   Plan  Continue with current plan of care      Dysphagia Recommendations   Diet recommendations  Thin liquid diet as tolerated    Liquids provided via  Cup    Compensations  Clear throat intermittently take single sips liquids      Progression Toward Goals   Progression toward goals  Progressing toward goals       SLP Education - 09/19/17 1722    Education provided  Yes    Education Details  overt s/s aspiration pNA, drink single sips    Person(s) Educated  Patient;Spouse    Methods  Explanation;Demonstration    Comprehension  Verbalized understanding       SLP Short Term Goals - 09/19/17 1731      SLP SHORT TERM GOAL #1   Title  pt will complete HEP with rare min A     Status   Achieved      SLP SHORT TERM GOAL #2   Title  pt will tell SLP why he is completing HEP  Status  Achieved      SLP SHORT TERM GOAL #3   Title  pt will tell SLP 3 overt s/s aspiration PNA with modified independence    Status  Achieved       SLP Long Term Goals - 09/19/17 1731      SLP LONG TERM GOAL #1   Title  pt will complete HEP with modified independence over two sessions     Time  1    Period  -- visits    Status  On-going      SLP LONG TERM GOAL #2   Title  pt will tell SLP why a food journal is helpful in returning to most liberal diet    Time  1    Period  -- visits    Status  On-going       Plan - 09/19/17 1730    Clinical Impression Statement  Pt with oropharyngeal swallowing appearedly WNL, however pt with questionable aspiration PNA in the last two weeks. SLP provided pt/wife overt s/s aspiration pNA today. Note that the probability of swallowing difficulty increases dramatically with the initiation of radiation therapy. Pt will need to be followed by SLP for regular assessment of accurate HEP completion as well as for safety with POs both during and following treatment/s.    Speech Therapy Frequency  -- once approx every 4 weeks    Duration  -- 3 total visits    Treatment/Interventions  Aspiration precaution training;Pharyngeal strengthening exercises;Diet toleration management by SLP;Compensatory techniques;Internal/external aids;SLP instruction and feedback;Patient/family education;Trials of upgraded texture/liquids    Potential to Achieve Goals  Good       Patient will benefit from skilled therapeutic intervention in order to improve the following deficits and impairments:   Dysphagia, unspecified type    Problem List Patient Active Problem List   Diagnosis Date Noted  . Chronic sinusitis 07/04/2017  . 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 CAD (coronary artery disease),  native coronary artery 09/18/2015  . Orthostatic hypotension 07/20/2015  . Smoking 02/26/2015  . Neurocardiogenic syncope 05/26/2013  . Seizures ([place]) 05/03/2013  . Chronic kidney disease   . Type II diabetes mellitus ([place])   . OSA on CPAP 09/26/2012  . Neuropathic pain of both legs 04/23/2012  . S/P lobectomy of lung 04/04/2012  . Non-small cell carcinoma of lung, stage 1 ([place]) 03/17/2012    Class: Stage 1  . Chest pain, atypical 12/21/2011  . COPD exacerbation ([place]) 09/20/2011  . Hypertension   . Depression   . GERD (gastroesophageal reflux disease)   . CIGARETTE SMOKER 09/25/2007  . DIZZINESS AND GIDDINESS 09/25/2007  . Chronic obstructive pulmonary disease ([place]) 08/22/2007    [employer] ,[place], CCC-SLP  09/19/2017, 5:33 PM  [facility] [address] [place], [place], [address] Phone: [phone]   Fax:  [phone]   Name: [name] MRN: [id number] Date of Birth: October 17, 1951

## 2017-09-19 NOTE — Patient Instructions (Signed)
Signs of Aspiration Pneumonia   . Chest pain/tightness . Fever (can be low grade) . Cough  o With foul-smelling phlegm (sputum) o With sputum containing pus or blood o With greenish sputum . Fatigue  . Shortness of breath  . Wheezing   **IF YOU HAVE THESE SIGNS, CONTACT YOUR DOCTOR OR GO TO THE EMERGENCY DEPARTMENT OR URGENT CARE AS SOON AS POSSIBLE**      

## 2017-09-20 ENCOUNTER — Ambulatory Visit
Admission: RE | Admit: 2017-09-20 | Discharge: 2017-09-20 | Disposition: A | Discharge status: Home / Self Care | Payer: PPO | Source: Ambulatory Visit | Attending: Radiation Oncology | Admitting: Radiation Oncology

## 2017-09-20 DIAGNOSIS — C3411 Malignant neoplasm of upper lobe, right bronchus or lung: Secondary | ICD-10-CM | POA: Diagnosis not present

## 2017-09-20 DIAGNOSIS — C320 Malignant neoplasm of glottis: Secondary | ICD-10-CM | POA: Diagnosis not present

## 2017-09-22 ENCOUNTER — Ambulatory Visit
Admission: RE | Admit: 2017-09-22 | Discharge: 2017-09-22 | Disposition: A | Discharge status: Home / Self Care | Payer: PPO | Source: Ambulatory Visit | Attending: Radiation Oncology | Admitting: Radiation Oncology

## 2017-09-22 DIAGNOSIS — C3411 Malignant neoplasm of upper lobe, right bronchus or lung: Secondary | ICD-10-CM | POA: Diagnosis not present

## 2017-09-22 DIAGNOSIS — C320 Malignant neoplasm of glottis: Secondary | ICD-10-CM | POA: Diagnosis not present

## 2017-09-23 ENCOUNTER — Ambulatory Visit
Admission: RE | Admit: 2017-09-23 | Discharge: 2017-09-23 | Disposition: A | Discharge status: Home / Self Care | Payer: PPO | Source: Ambulatory Visit | Attending: Radiation Oncology | Admitting: Radiation Oncology

## 2017-09-23 DIAGNOSIS — C320 Malignant neoplasm of glottis: Secondary | ICD-10-CM | POA: Diagnosis not present

## 2017-09-23 DIAGNOSIS — C3411 Malignant neoplasm of upper lobe, right bronchus or lung: Secondary | ICD-10-CM | POA: Diagnosis not present

## 2017-09-24 ENCOUNTER — Ambulatory Visit: Payer: PPO

## 2017-09-24 ENCOUNTER — Ambulatory Visit
Admission: RE | Admit: 2017-09-24 | Discharge: 2017-09-24 | Disposition: A | Discharge status: Home / Self Care | Payer: PPO | Source: Ambulatory Visit | Attending: Radiation Oncology | Admitting: Radiation Oncology

## 2017-09-24 DIAGNOSIS — C320 Malignant neoplasm of glottis: Secondary | ICD-10-CM | POA: Diagnosis not present

## 2017-09-24 DIAGNOSIS — C3411 Malignant neoplasm of upper lobe, right bronchus or lung: Secondary | ICD-10-CM | POA: Diagnosis not present

## 2017-09-24 NOTE — Progress Notes (Signed)
Nutrition Follow-up:  Patient with glottis cancer receiving radiation therapy.  Patient is followed by Dr. Tammi Klippel  Patient seen with wife following radiation therapy this am.  Patient reports eating is fair.  Still trying to drink 2-3 equate plus shakes daily.  Reports breakfast foods are better tolerated than anything else.  Reports that he has tried egg salad this past week with success and ate meatloaf, squash and pinto beans.  Reports that he has not taken hycet but continues to take carafate with benefits.    Still coughing up phelgm per wife.  Antibiotics are finished.  Planning to meet with MD tomorrow.  Medications: reviewed  Labs: reviewed  Anthropometrics:   Wife reports no new weight since 11/5 of 168 lb.  Planning to weight tomorrow at radiation.    NUTRITION DIAGNOSIS: predicted suboptimal energy intake improving   MALNUTRITION DIAGNOSIS: severe malnutrition continues   INTERVENTION:   Encouraged patient to add ice cream, peanut butter to shake to increase calories and protein as only able to drink 2-3 shakes per day. Encouraged small frequent meals and small bites of food.  Encouraged adding gravy, condiments, sauces to food for easy of swallowing and additional calories.    MONITORING, EVALUATION, GOAL: patient will tolerate adequate calories and protein to maintain weight   NEXT VISIT: Tuesday, November 27 after radiation  Sandor Arboleda B. Zenia Resides, [place], [place] Registered Dietitian [phone] (pager)

## 2017-09-25 ENCOUNTER — Ambulatory Visit
Admission: RE | Admit: 2017-09-25 | Discharge: 2017-09-25 | Disposition: A | Discharge status: Home / Self Care | Payer: PPO | Source: Ambulatory Visit | Attending: Radiation Oncology | Admitting: Radiation Oncology

## 2017-09-25 ENCOUNTER — Other Ambulatory Visit: Payer: Self-pay | Admitting: Radiation Oncology

## 2017-09-25 DIAGNOSIS — C320 Malignant neoplasm of glottis: Secondary | ICD-10-CM | POA: Diagnosis not present

## 2017-09-25 DIAGNOSIS — C3411 Malignant neoplasm of upper lobe, right bronchus or lung: Secondary | ICD-10-CM | POA: Diagnosis not present

## 2017-09-25 MED ORDER — HYDROCODONE-ACETAMINOPHEN 7.5-325 MG/15ML PO SOLN: 10.0000 mL | ORAL | 0 refills | Status: DC | PRN

## 2017-09-30 ENCOUNTER — Ambulatory Visit: Payer: PPO | Admitting: Interventional Cardiology

## 2017-09-30 ENCOUNTER — Ambulatory Visit
Admission: RE | Admit: 2017-09-30 | Discharge: 2017-09-30 | Disposition: A | Discharge status: Home / Self Care | Payer: PPO | Source: Ambulatory Visit | Attending: Radiation Oncology | Admitting: Radiation Oncology

## 2017-09-30 DIAGNOSIS — C320 Malignant neoplasm of glottis: Secondary | ICD-10-CM | POA: Diagnosis not present

## 2017-09-30 DIAGNOSIS — C3411 Malignant neoplasm of upper lobe, right bronchus or lung: Secondary | ICD-10-CM | POA: Diagnosis not present

## 2017-10-01 ENCOUNTER — Other Ambulatory Visit: Payer: Self-pay | Admitting: Urology

## 2017-10-01 ENCOUNTER — Ambulatory Visit
Admission: RE | Admit: 2017-10-01 | Discharge: 2017-10-01 | Disposition: A | Discharge status: Home / Self Care | Payer: PPO | Source: Ambulatory Visit | Attending: Radiation Oncology | Admitting: Radiation Oncology

## 2017-10-01 ENCOUNTER — Ambulatory Visit: Payer: PPO

## 2017-10-01 DIAGNOSIS — C320 Malignant neoplasm of glottis: Secondary | ICD-10-CM | POA: Diagnosis not present

## 2017-10-01 DIAGNOSIS — C3432 Malignant neoplasm of lower lobe, left bronchus or lung: Secondary | ICD-10-CM

## 2017-10-01 DIAGNOSIS — C3411 Malignant neoplasm of upper lobe, right bronchus or lung: Secondary | ICD-10-CM | POA: Diagnosis not present

## 2017-10-01 MED ORDER — HYDROCODONE-ACETAMINOPHEN 7.5-325 MG/15ML PO SOLN: 10.0000 mL | Freq: Four times a day (QID) | ORAL | 0 refills | Status: DC | PRN

## 2017-10-01 NOTE — Progress Notes (Signed)
Nutrition Follow-up:  Patient with glottis cancer receiving radiation therapy.  Patient is followed by Dr. Tammi Klippel  Patient seen with wife following radiation treatment today.  Last treatment set for Friday, 11/30.  Patient reports over the past week decrease intake of solid foods and liquids due to throat pain.  Reports some dry mouth and thick mucus.  Reports coughing and then throwing up.  Wife reports that he is out of hycet.  Patient also reports that ears are stopped up.  Wife reports planning to see PCP tomorrow.  Wife reports that yesterday was able to eat breakfast type foods eggs, grits, some potato soup and 2 protein shakes.  Patient reports not drinking much either due to pain.    Medications: reviewed  Labs: reviewed  Anthropometrics:   No new weight but wife feels that patient has lost weight   NUTRITION DIAGNOSIS: Predicted suboptimal energy intake continues    MALNUTRITION DIAGNOSIS: severe malnutrition continues   INTERVENTION:   Encouraged patient to continue to drink oral nutrition supplement shakes daily.  Feels that he can only drink 2 shakes per day.  Discussed ways to increase calories and protein in shakes. Encouraged wife to ask for refill of hycet today as patient with decreased intake and pain with no medication to help. Patient will continue carafate. Discussed importance of hydration and ways to meet hydration goals.   Discussed strategies to help with thick saliva and dry mouth. Fact sheet given.    2nd complimentary case of ensure plus given to patient today.   MONITORING, EVALUATION, GOAL: Patient will tolerate adequate calories and protein to maintain weight   NEXT VISIT: as needed  Hong Timm B. Zenia Resides, [place], [place] Registered Dietitian [phone] (pager)

## 2017-10-02 ENCOUNTER — Other Ambulatory Visit: Payer: Self-pay

## 2017-10-02 ENCOUNTER — Other Ambulatory Visit: Payer: Self-pay | Admitting: Radiation Oncology

## 2017-10-02 ENCOUNTER — Ambulatory Visit
Admission: RE | Admit: 2017-10-02 | Discharge: 2017-10-02 | Disposition: A | Discharge status: Home / Self Care | Payer: PPO | Source: Ambulatory Visit | Attending: Radiation Oncology | Admitting: Radiation Oncology

## 2017-10-02 ENCOUNTER — Encounter: Payer: Self-pay | Admitting: Family Medicine

## 2017-10-02 ENCOUNTER — Ambulatory Visit: Payer: PPO | Admitting: Family Medicine

## 2017-10-02 VITALS — BP 132/72 | HR 66 | Temp 97.9°F | Resp 16 | Ht 71.0 in | Wt 169.0 lb

## 2017-10-02 DIAGNOSIS — J441 Chronic obstructive pulmonary disease with (acute) exacerbation: Secondary | ICD-10-CM

## 2017-10-02 DIAGNOSIS — C320 Malignant neoplasm of glottis: Secondary | ICD-10-CM

## 2017-10-02 DIAGNOSIS — J189 Pneumonia, unspecified organism: Secondary | ICD-10-CM | POA: Diagnosis not present

## 2017-10-02 DIAGNOSIS — C3432 Malignant neoplasm of lower lobe, left bronchus or lung: Secondary | ICD-10-CM

## 2017-10-02 DIAGNOSIS — C3411 Malignant neoplasm of upper lobe, right bronchus or lung: Secondary | ICD-10-CM | POA: Diagnosis not present

## 2017-10-02 NOTE — Progress Notes (Signed)
   Subjective:    Patient ID: [name], male    DOB: [birth year], 66 y.o.   MRN: [id number]  Patient presents for Illness (PNA found by oncology and wanted pt to F/U with PCP)   Pt here to F/U PNA.  He is currently being treated for cancer of his glottis with radiation therapy.   He presented to his oncologist with cough with production for 2 weeks.  Chest x-ray was obtained on November 7 which showed concerning opacities at both lung bases.  He has had difficulties with his oral intake which is being addressed by the nutritionist associated with his oncologist.  They have him on protein replacement shakes.  He is also on Hycet for pain,  Now   Using Duoneb once a day which helps  No fever, no oxygen use at home  Continues to have some cough and wheezing but overall feels better. Does not feel SOB.   Lantus every other day, Dr. Louanna Raw endocrinology   Review Of Systems:  GEN- denies fatigue, fever, weight loss,weakness, recent illness HEENT- denies eye drainage, change in vision, nasal discharge, CVS- denies chest pain, palpitations RESP- denies SOB, cough, wheeze ABD- denies N/V, change in stools, abd pain GU- denies dysuria, hematuria, dribbling, incontinence MSK- denies joint pain, muscle aches, injury Neuro- denies headache, dizziness, syncope, seizure activity       Objective:    BP 132/72   Pulse 66   Temp 97.9 F (36.6 C) (Oral)   Resp 16   Ht 5\' 11"  (1.803 m)   Wt 169 lb (76.7 kg)   SpO2 95%   BMI 23.57 kg/m  GEN- NAD, alert and oriented x3 HEENT- PERRL, EOMI, non injected sclera, pink conjunctiva, MMM, oropharynx clear, clear rhinorrhea, TM clear no effusion Neck- Supple, erythema across neck ( chronic due to treatments) CVS- RRR, no murmur RESP- bilat wheeze, normal WOB at rest, no rales EXT- No edema Pulses- Radial  2+        Assessment & Plan:      Problem List Items Addressed This Visit      Unprioritized   COPD exacerbation ([place])   Relevant Orders   DG Chest 2 View    Other Visit Diagnoses    Community acquired pneumonia, unspecified laterality    -  Primary   CAP, triggering his COPD, due to treatments,trying to avoid oral prednisone, given Depo Medrol injection in office, continue duonebs and has Stiolto now. Repeat CXR in AM Symptoms have overall improved. If still present will treat further for PNA   Relevant Orders   DG Chest 2 View      Note: This dictation was prepared with Dragon dictation along with smaller phrase technology. Any transcriptional errors that result from this process are unintentional.

## 2017-10-02 NOTE — Patient Instructions (Addendum)
Use nasal saline Depo Medrol shot given Use neb twice a day  Get chest xray- [facility]  F/U pending results

## 2017-10-03 ENCOUNTER — Ambulatory Visit
Admission: RE | Admit: 2017-10-03 | Discharge: 2017-10-03 | Disposition: A | Discharge status: Home / Self Care | Payer: PPO | Source: Ambulatory Visit | Attending: Family Medicine | Admitting: Family Medicine

## 2017-10-03 ENCOUNTER — Ambulatory Visit
Admission: RE | Admit: 2017-10-03 | Discharge: 2017-10-03 | Disposition: A | Discharge status: Home / Self Care | Payer: PPO | Source: Ambulatory Visit | Attending: Radiation Oncology | Admitting: Radiation Oncology

## 2017-10-03 ENCOUNTER — Encounter: Payer: Self-pay | Admitting: Family Medicine

## 2017-10-03 DIAGNOSIS — R05 Cough: Secondary | ICD-10-CM | POA: Diagnosis not present

## 2017-10-03 DIAGNOSIS — J189 Pneumonia, unspecified organism: Secondary | ICD-10-CM

## 2017-10-03 DIAGNOSIS — J441 Chronic obstructive pulmonary disease with (acute) exacerbation: Secondary | ICD-10-CM

## 2017-10-03 DIAGNOSIS — C320 Malignant neoplasm of glottis: Secondary | ICD-10-CM | POA: Diagnosis not present

## 2017-10-03 DIAGNOSIS — C3411 Malignant neoplasm of upper lobe, right bronchus or lung: Secondary | ICD-10-CM | POA: Diagnosis not present

## 2017-10-03 DIAGNOSIS — C3432 Malignant neoplasm of lower lobe, left bronchus or lung: Secondary | ICD-10-CM

## 2017-10-03 MED ORDER — SONAFINE EX EMUL
1.0000 "application " | Freq: Two times a day (BID) | CUTANEOUS | Status: DC
  Administered 2017-10-03: 1 via TOPICAL

## 2017-10-04 ENCOUNTER — Ambulatory Visit: Payer: PPO

## 2017-10-04 ENCOUNTER — Ambulatory Visit
Admission: RE | Admit: 2017-10-04 | Discharge: 2017-10-04 | Disposition: A | Discharge status: Home / Self Care | Payer: PPO | Source: Ambulatory Visit | Attending: Radiation Oncology | Admitting: Radiation Oncology

## 2017-10-04 ENCOUNTER — Encounter: Payer: Self-pay | Admitting: *Deleted

## 2017-10-04 ENCOUNTER — Encounter: Payer: Self-pay | Admitting: Radiation Oncology

## 2017-10-04 DIAGNOSIS — C3411 Malignant neoplasm of upper lobe, right bronchus or lung: Secondary | ICD-10-CM | POA: Diagnosis not present

## 2017-10-04 DIAGNOSIS — C320 Malignant neoplasm of glottis: Secondary | ICD-10-CM | POA: Diagnosis not present

## 2017-10-04 NOTE — Progress Notes (Signed)
Oncology Nurse Navigator Documentation  Met with pt during final RT to offer support and to celebrate end of radiation treatment.  He was accompanied by his wife. I provided wife with a Certificate of Recognition for her supportive care. I provided verbal/written post-RT guidance:  Importance of keeping all follow-up appts, especially those with Nutrition and SLP.  Importance of protecting treatment area from sun.  Continuation of Sonafine application 2-3 times daily until supply exhausted after which transition to OTC lotion with vitamin E. I explained that my role as navigator will continue for several more months and that I will be calling and/or joining him during follow-up visits.   I encouraged them to call me with needs/concerns.   Patient and wife verbalized understanding of information provided.  Gayleen Orem, RN, BSN, [place] at [place] [phone]

## 2017-10-07 ENCOUNTER — Ambulatory Visit: Payer: PPO

## 2017-10-07 NOTE — Progress Notes (Signed)
  Radiation Oncology         [phone]) [phone] ________________________________  Name: [name] MRN: [id number]  Date: 10/04/2017  DOB: 22-May-1951  End of Treatment Note  Diagnosis:   66 y.o. 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  Beams/energy:   3D // 6X Photon  Narrative: The patient tolerated radiation treatment relatively well.   He experienced moderate fatigue and moderate throat pain that was internal as well as external, with dysphagia and odynophagia which was controlled with the use of Hycet prn. He did lose some weight as a result. He also reported dry mouth, thick saliva, and taste changes. He had erythema and dry desquamation over the skin to his anterior neck. He applied Biafine cream to this area.   Plan: The patient has completed radiation treatment. He was advised to continue alternating carafate and hydrocodone for his pain and to use the biafine cream on his skin. He will be scheduled for CT Chest and Neck in January 2019 and will follow up after those scans to discuss results. The patient will return to radiation oncology clinic for routine followup in one month. I advised him to call or return sooner if he has any questions or concerns related to his recovery or treatment. ________________________________  Sheral Apley. Tammi Klippel, M.D.  This document serves as a record of services personally performed by Tyler Pita, MD. It was created on his behalf by Rae Lips, a trained medical scribe. The creation of this record is based on the scribe's personal observations and the provider's statements to them. This document has been checked and approved by the attending provider.

## 2017-10-10 ENCOUNTER — Telehealth: Payer: Self-pay | Admitting: *Deleted

## 2017-10-10 ENCOUNTER — Telehealth: Payer: Self-pay | Admitting: Radiation Oncology

## 2017-10-10 ENCOUNTER — Other Ambulatory Visit: Payer: Self-pay | Admitting: Urology

## 2017-10-10 DIAGNOSIS — C320 Malignant neoplasm of glottis: Secondary | ICD-10-CM

## 2017-10-10 DIAGNOSIS — C3411 Malignant neoplasm of upper lobe, right bronchus or lung: Secondary | ICD-10-CM

## 2017-10-10 MED ORDER — HYDROCODONE-ACETAMINOPHEN 7.5-325 MG/15ML PO SOLN: 10.0000 mL | ORAL | 0 refills | Status: DC | PRN

## 2017-10-10 MED ORDER — HYDROCODONE-ACETAMINOPHEN 7.5-325 MG/15ML PO SOLN: 10.0000 mL | Freq: Four times a day (QID) | ORAL | 0 refills | Status: DC | PRN

## 2017-10-10 NOTE — Telephone Encounter (Signed)
Phoned patient's wife and explained her husband's hycet refill has been sent to [facility] in [place] using our new finger ID system. Explained the script should be ready to pick up later today. She verbalized understanding and expressed appreciation for the call.

## 2017-10-10 NOTE — Telephone Encounter (Signed)
Oncology Nurse Navigator Documentation  Called patient wife, informed her Hycet Rx has been reissued to [facility] [address].  She voiced understanding.  Gayleen Orem, RN, BSN, [place] Oncology Nurse [place] at [place] [phone]

## 2017-10-10 NOTE — Telephone Encounter (Signed)
-----   Message from Freeman Caldron, [place] sent at 10/10/2017  1:36 PM EST ----- Regarding: RE: Still struggling Please let his wife know that I sent the prescription for Hycet to his [facility] in [place] using my new fingerprint I.d. System. :) He should be able to pick this up later this afternoon. Thanks! -Ashlyn ----- Message ----- From: Heywood Footman, RN Sent: 10/10/2017  12:53 PM To: Freeman Caldron, PA-C, Leota Sauers, RN Subject: Still struggling                               Ashlyn.  Today I received a call from the patient's wife. She states, "he is really struggling this week." The patient completed radiation therapy on 10/04/2017. Receiving 68 Gy in 28 fractions to the right vocal cord. The computer shows Lisbeth Renshaw gave him hycet around 11/20 but I could have sworn you gave it since then. Anyway the wife reports he is sleeping a lot and eating very little. She is requesting a refill of his hycet since there isn't enough to last the weekend. She understands if this is refilled they would need to come to [facility] to pick it up.   Sam

## 2017-10-10 NOTE — Telephone Encounter (Signed)
Oncology Nurse Navigator Documentation  Received call from pt wife.  She indicated [facility] unable to fill Hycet Rx, [facility] [address] [facility]  PA Ashlyn Brunning notified.  Gayleen Orem, RN, BSN, [place] [facility] Oncology Nurse [place] at [place] [phone]

## 2017-10-16 ENCOUNTER — Other Ambulatory Visit: Payer: Self-pay

## 2017-10-16 ENCOUNTER — Ambulatory Visit: Payer: PPO | Attending: Radiation Oncology

## 2017-10-16 DIAGNOSIS — R1310 Dysphagia, unspecified: Secondary | ICD-10-CM | POA: Insufficient documentation

## 2017-10-16 NOTE — Therapy (Signed)
[facility] [address] [place], [place], [address] Phone: [phone]   Fax:  [phone]  Speech Language Pathology Treatment  Patient Details  Name: [name] MRN: [id number] Date of Birth: [birth year] Referring Provider: Tyler Pita, MD   Encounter Date: 10/16/2017  End of Session - 10/16/17 1401    Visit Number  3    Number of Visits  5    Date for SLP Re-Evaluation  01/02/18    SLP Start Time  31    SLP Stop Time   1352    SLP Time Calculation (min)  33 min    Activity Tolerance  Patient tolerated treatment well       Past Medical History:  Diagnosis Date  . Allergy   . Anemia   . Anginal pain ([place])   . Arthritis    "hands"  . Asthma   . Back pain    4 deteriorating disc and receives an injection q3-71mo;has been doing this for about 671yr . Blood transfusion    as a child  . Bronchitis   . Cancer ([facility] [place] 2013   Non-small cell lung cancer  . Chronic kidney disease    acute kidney failure post surgery  . COPD (chronic obstructive pulmonary disease) (HCC)    uses Albuterol and Spiriva daily  . Coronary artery disease    has 1 stent  . Depression    takes Prozac daily  . Emphysema    sees Dr.Ramaswami for this  . Family history of adverse reaction to anesthesia    mother was hard to wake up sometimes  . GERD (gastroesophageal reflux disease)    takes Prilosec daily, EGD nml 06/2017  . Glaucoma    hx of  . H/O hiatal hernia   . Hypertension    takes  HYzaar daily  . Insomnia    takes Trazodone nightly  . Lung mass    right upper lobe  . Myocardial infarct ([place]  . 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 Obesity   . Parathyroid disease ([place]  . Periodic limb movements of sleep   . Peripheral neuropathy   . Pneumonia    hx of' 66 yo,rd' last time in 2013  . Productive cough    white in color but no odor  . Shortness of breath    with exertion   . Sleep  apnea    uses BiPaP; "no longer have sleep apnea since I have lost over 100 lbs"  . Syncope and collapse 05/26/2013  . Thyroid disease   . Type II diabetes mellitus (HCC)    takes Metformin bid and Novolog and Lantus daily    Past Surgical History:  Procedure Laterality Date  . CARDIAC CATHETERIZATION  2006/2008/2009  . CARPAL TUNNEL RELEASE     bilateral  . COLONOSCOPY    . CORONARY ANGIOPLASTY WITH STENT PLACEMENT     1 stent  . ELBOW SURGERY     ulnar nerve; left  . ESOPHAGOGASTRODUODENOSCOPY    . EYE SURGERY     pt. denies  . FUDUCIAL PLACEMENT Left 08/24/2016   Procedure: PLACEMENT OF FUDUCIAL;  Surgeon: StMelrose NakayamaMD;  [facility] Service: Thor[facility];  Laterality: Left;  . INGUINAL HERNIA REPAIR  1990's   double inguinal  . LOBECTOMY  03/17/2012   upper right side with resection  . MICROLARYNGOSCOPY WITH CO2 LASER AND EXCISION OF VOCAL CORD LESION N/A  06/28/2017   Procedure: MICROLARYNGOSCOPY WITH BIOPSY;  Surgeon: Melida Quitter, MD;  Location: [facility];  Service: ENT;  Laterality: N/A;  . MICROLARYNGOSCOPY WITH CO2 LASER AND EXCISION OF VOCAL CORD LESION N/A 07/19/2017   Procedure: MICROLARYNGOSCOPY WITH CO2 LASER AND EXCISION OF VOCAL CORD LESION;  Surgeon: Melida Quitter, MD;  Location: [facility];  Service: ENT;  Laterality: N/A;  MICRO DIRECT LARYNGOSCOPY WITH CO2 LASER VOCAL CORD STRIPPING/POSS JET VENTURI VENTILATION  . REFRACTIVE SURGERY     bilaterally  . ROTATOR CUFF REPAIR     left  . VIDEO BRONCHOSCOPY WITH ENDOBRONCHIAL NAVIGATION N/A 08/24/2016   Procedure: VIDEO BRONCHOSCOPY WITH ENDOBRONCHIAL NAVIGATION;  Surgeon: Melrose Nakayama, MD;  Location: [facility];  Service: Thoracic;  Laterality: N/A;    There were no vitals filed for this visit.  Subjective Assessment - 10/16/17 1324    Subjective  Last day rad tx 10-11-17. "PNA its cleared up" - confirmed by 10-03-17 chest x-ray.    Patient is accompained by:  Family member wife    Currently in Pain?  Yes     Pain Score  10-Worst pain ever    Pain Location  Throat    Pain Orientation  Mid    Pain Descriptors / Indicators  Constant    Pain Type  Acute pain    Pain Onset  1 to 4 weeks ago    Pain Frequency  Constant    Aggravating Factors   coughing, talking, swallowing    Pain Relieving Factors  sleep, medications            ADULT SLP TREATMENT - 10/16/17 1328      General Information   Behavior/Cognition  Alert;Cooperative;Pleasant mood    Patient Positioning  Upright in chair      Treatment Provided   Treatment provided  Dysphagia      Dysphagia Treatment   Temperature Spikes Noted  No    Respiratory Status  Room air    Oral Cavity - Dentition  Dentures, top;Dentures, bottom    Treatment Methods  Skilled observation;Therapeutic exercise    Patient observed directly with PO's  Yes    Type of PO's observed  Dysphagia 3 (soft);Thin liquids    Oral Phase Signs & Symptoms  -- none noted    Pharyngeal Phase Signs & Symptoms  -- none noted    Other treatment/comments  Pt aphonic today. Reports eating Dys III diet and 3-4 protein shakes/day. With HEP pt reported he completes "2-3 times a week". SLP reiterated why BID completion is necessary and pt told SLP why he completes HEP ("To keep the muscles from getting hard."). Pt req'd mod cues for HEP, usually.  SLP told pt/wife overt s/s of aspiration to signal possible muscle fibrosis negatively affecting swallow ability.       Assessment / Recommendations / Plan   Plan  Continue with current plan of care      Dysphagia Recommendations   Diet recommendations  Thin liquid as tolerated    Liquids provided via  Cup    Compensations  -- single sips liquids      Progression Toward Goals   Progression toward goals  Progressing toward goals       SLP Education - 10/16/17 1401    Education provided  Yes    Education Details  need to do HEP BID, 6/7 days/week, HEP procedure    Person(s) Educated  Patient;Spouse    Methods   Explanation;Demonstration;Verbal cues    Comprehension  Verbalized understanding;Returned demonstration;Verbal cues required       SLP Short Term Goals - 09/19/17 1731      SLP SHORT TERM GOAL #1   Title  pt will complete HEP with rare min A     Status  Achieved      SLP SHORT TERM GOAL #2   Title  pt will tell SLP why he is completing HEP     Status  Achieved      SLP SHORT TERM GOAL #3   Title  pt will tell SLP 3 overt s/s aspiration PNA with modified independence    Status  Achieved       SLP Long Term Goals - 10/16/17 1338      SLP LONG TERM GOAL #1   Title  pt will complete HEP with modified independence over two sessions     Time  2    Period  -- visits (5 total visits)    Status  Not Met and ongoing      SLP LONG TERM GOAL #2   Title  pt will tell SLP why a food journal is helpful in returning to most liberal diet    Time  --    Period  --    Status  Achieved      SLP LONG TERM GOAL #3   Title  pt will tell SLP overt s/s aspiraiton PNA with modified independence over two consecutive sessions    Time  2    Period  -- visits (5 total visits)    Status  New       Plan - 10/16/17 1402    Clinical Impression Statement  Pt with oropharyngeal swallowing appearedly WNL, PNA has cleared and pt/wife report no overt s/s aspiration PNA. SLP reminded pt/wife overt s/s aspiration today, adn taught about food journal. Pt completion of HEP is suboptimal. Note that the probability of swallowing difficulty increases dramatically with the initiation of radiation therapy. Pt will need to cont to be followed by SLP for regular assessment of accurate HEP completion as well as for safety with POs both during and following treatment/s.    Speech Therapy Frequency  -- once approx every 4 weeks    Duration  -- 5 total visits    Treatment/Interventions  Aspiration precaution training;Pharyngeal strengthening exercises;Diet toleration management by SLP;Compensatory  techniques;Internal/external aids;SLP instruction and feedback;Patient/family education;Trials of upgraded texture/liquids    Potential to Achieve Goals  Good       Patient will benefit from skilled therapeutic intervention in order to improve the following deficits and impairments:   Dysphagia, unspecified type    Problem List Patient Active Problem List   Diagnosis Date Noted  . Chronic sinusitis 07/04/2017  . 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 CAD (coronary artery disease), native coronary artery 09/18/2015  . Orthostatic hypotension 07/20/2015  . Smoking 02/26/2015  . Neurocardiogenic syncope 05/26/2013  . Seizures ([place]) 05/03/2013  . Chronic kidney disease   . Type II diabetes mellitus ([place])   . OSA on CPAP 09/26/2012  . Neuropathic pain of both legs 04/23/2012  . S/P lobectomy of lung 04/04/2012  . Non-small cell carcinoma of lung, stage 1 ([place]) 03/17/2012    Class: Stage 1  . Chest pain, atypical 12/21/2011  . COPD exacerbation ([place]) 09/20/2011  . Hypertension   . Depression   . GERD (gastroesophageal reflux disease)   .  CIGARETTE SMOKER 09/25/2007  . DIZZINESS AND GIDDINESS 09/25/2007  . Chronic obstructive pulmonary disease ([place]) 08/22/2007    [facility] ,[place], CCC-SLP  10/16/2017, 5:11 PM  [facility] [address] [place], [place], [address] Phone: [phone]   Fax:  [phone]   Name: TARA WICH MRN: [id number] Date of Birth: 08-26-1951

## 2017-10-18 ENCOUNTER — Ambulatory Visit: Payer: PPO | Admitting: Interventional Cardiology

## 2017-10-18 ENCOUNTER — Encounter: Payer: Self-pay | Admitting: Interventional Cardiology

## 2017-10-18 VITALS — BP 104/56 | HR 83 | Ht 71.0 in | Wt 168.0 lb

## 2017-10-18 DIAGNOSIS — I10 Essential (primary) hypertension: Secondary | ICD-10-CM

## 2017-10-18 DIAGNOSIS — R55 Syncope and collapse: Secondary | ICD-10-CM | POA: Diagnosis not present

## 2017-10-18 DIAGNOSIS — I2510 Atherosclerotic heart disease of native coronary artery without angina pectoris: Secondary | ICD-10-CM

## 2017-10-18 DIAGNOSIS — C320 Malignant neoplasm of glottis: Secondary | ICD-10-CM

## 2017-10-18 DIAGNOSIS — C3411 Malignant neoplasm of upper lobe, right bronchus or lung: Secondary | ICD-10-CM | POA: Diagnosis not present

## 2017-10-18 NOTE — Patient Instructions (Signed)
Medication Instructions:  Your physician recommends that you continue on your current medications as directed. Please refer to the Current Medication list given to you today.  Labwork: None  Testing/Procedures: None  Follow-Up: Your physician wants you to follow-up in: 9-12 months with Dr. Tamala Julian.  You will receive a reminder letter in the mail two months in advance. If you don't receive a letter, please call our office to schedule the follow-up appointment.   Any Other Special Instructions Will Be Listed Below (If Applicable).     If you need a refill on your cardiac medications before your next appointment, please call your pharmacy.

## 2017-10-18 NOTE — Progress Notes (Signed)
Cardiology Office Note    Date:  10/18/2017   ID:  [name], DOB 08-10-1951, MRN [id number]  PCP:  [name], MD  Cardiologist: Sinclair Grooms, MD   Chief Complaint  Patient presents with  . Coronary Artery Disease    History of Present Illness:  [name] is a 66 y.o. 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  He has lost a tremendous amount of weight.  He complains of occasional chest pressure.  This started after radiation therapy.  He has had difficulty swallowing.  He has had some aspiration and had one severe bout of pneumonia.  He denies exertion related chest discomfort.  He is now 2 weeks status post last radiation therapy session and is improving.   Past Medical History:  Diagnosis Date  . Allergy   . Anemia   . Anginal pain ([place])   . Arthritis    "hands"  . Asthma   . Back pain    4 deteriorating disc and receives an injection q3-44mon;has been doing this for about 42yrs  . Blood transfusion    as a child  . Bronchitis   . Cancer ([place]) dx'd 2013   Non-small cell lung cancer  . Chronic kidney disease    acute kidney failure post surgery  . COPD (chronic obstructive pulmonary disease) (HCC)    uses Albuterol and Spiriva daily  . Coronary artery disease    has 1 stent  . Depression    takes Prozac daily  . Emphysema    sees Dr.Ramaswami for this  . Family history of adverse reaction to anesthesia    mother was hard to wake up sometimes  . GERD (gastroesophageal reflux disease)    takes Prilosec daily, EGD nml 06/2017  . Glaucoma    hx of  . H/O hiatal hernia   . Hypertension    takes  HYzaar daily  . Insomnia    takes Trazodone nightly  . Lung mass    right upper lobe  . Myocardial  infarct ([place])   . 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 Obesity   . Parathyroid disease ([place])   . Periodic limb movements of sleep   . Peripheral neuropathy   . Pneumonia    hx of' 66 yo,rd' last time in 2013  . Productive cough    white in color but no odor  . Shortness of breath    with exertion   . Sleep apnea    uses BiPaP; "no longer have sleep apnea since I have lost over 100 lbs"  . Syncope and collapse 05/26/2013  . Thyroid disease   . Type II diabetes mellitus (HCC)    takes Metformin bid and Novolog and Lantus daily    Past Surgical History:  Procedure Laterality Date  . CARDIAC CATHETERIZATION  2006/2008/2009  . CARPAL TUNNEL RELEASE     bilateral  . COLONOSCOPY    . CORONARY ANGIOPLASTY WITH STENT PLACEMENT     1 stent  . ELBOW SURGERY     ulnar nerve; left  . ESOPHAGOGASTRODUODENOSCOPY    . EYE SURGERY     pt. denies  . FUDUCIAL PLACEMENT Left 08/24/2016   Procedure: PLACEMENT OF FUDUCIAL;  Surgeon: Melrose Nakayama, MD;  [facility];  Service: Thoracic;  Laterality: Left;  . INGUINAL HERNIA REPAIR  1990's   double inguinal  . LOBECTOMY  03/17/2012   upper right side with resection  . MICROLARYNGOSCOPY WITH CO2 LASER AND EXCISION OF VOCAL CORD LESION N/A 06/28/2017   Procedure: MICROLARYNGOSCOPY WITH BIOPSY;  Surgeon: Melida Quitter, MD;  Location: [facility];  Service: ENT;  Laterality: N/A;  . MICROLARYNGOSCOPY WITH CO2 LASER AND EXCISION OF VOCAL CORD LESION N/A 07/19/2017   Procedure: MICROLARYNGOSCOPY WITH CO2 LASER AND EXCISION OF VOCAL CORD LESION;  Surgeon: Melida Quitter, MD;  Location: [facility];  Service: ENT;  Laterality: N/A;  MICRO DIRECT LARYNGOSCOPY WITH CO2 LASER VOCAL CORD STRIPPING/POSS JET VENTURI VENTILATION  . REFRACTIVE SURGERY     bilaterally  . ROTATOR CUFF REPAIR     left  . VIDEO BRONCHOSCOPY WITH ENDOBRONCHIAL NAVIGATION N/A 08/24/2016   Procedure: VIDEO BRONCHOSCOPY WITH ENDOBRONCHIAL NAVIGATION;  Surgeon: Melrose Nakayama, MD;  Location: [facility];  Service: Thoracic;  Laterality: N/A;    Current Medications: Outpatient Medications Prior to Visit  Medication Sig Dispense Refill  . acetaminophen (TYLENOL) 500 MG tablet Take 1,000 mg by mouth every 6 (six) hours as needed for moderate pain.    Marland Kitchen aspirin EC 81 MG tablet Take 81 mg by mouth daily.    Marland Kitchen atorvastatin (LIPITOR) 40 MG tablet Take 40 mg by mouth daily.    Marland Kitchen diltiazem (CARDIZEM CD) 120 MG 24 hr capsule Take 1 capsule (120 mg total) by mouth daily. 90 capsule 3  . diphenhydramine-acetaminophen (TYLENOL PM) 25-500 MG TABS tablet Take 1 tablet by mouth at bedtime as needed (sleep).     . Docosanol (ABREVA EX) Apply 1 application topically daily as needed (cold sores).    Marland Kitchen emollient (BIAFINE) cream Apply topically as needed.    Marland Kitchen FLUoxetine (PROZAC) 40 MG capsule TAKE ONE CAPSULE BY MOUTH EVERY DAY 90 capsule 1  . fluticasone (FLONASE) 50 MCG/ACT nasal spray Place 2 sprays into both nostrils daily.  5  . gabapentin (NEURONTIN) 300 MG capsule TAKE 1 CAPSULE BY MOUTH THREE TIMES DAILY 270 capsule 1  . HYDROcodone-acetaminophen (HYCET) 7.5-325 mg/15 ml solution Take 10 mLs by mouth 4 (four) times daily as needed for moderate pain. 473 mL 0  . ipratropium-albuterol (DUONEB) 0.5-2.5 (3) MG/3ML SOLN USE 1 VIAL IN NEBULIZER 4 TIMES A DAY (Patient taking differently: Inhale 1 vial every 6 hours as needed for shortness of breath) 360 mL 5  . LANTUS 100 UNIT/ML injection Inject 12 Units into the skin every morning.   3  . losartan (COZAAR) 25 MG tablet Take 25 mg by mouth daily.    . Multiple Vitamin (MULTIVITAMIN WITH MINERALS) TABS Take 1 tablet by mouth daily.    . nitroGLYCERIN (NITROSTAT) 0.4 MG SL tablet Place 0.4 mg under the tongue every 5 (five) minutes as needed for chest pain.    . ranitidine (ZANTAC) 150 MG capsule Take 1 capsule (150 mg total) by mouth 2 (two) times daily. (Patient taking differently: Take 150 mg by mouth daily. ) 60 capsule 11    . sucralfate (CARAFATE) 1 g tablet Take 1 tablet (1 g total) 4 (four) times daily -  with meals and at bedtime by mouth. 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 topiramate (TOPAMAX) 50 MG tablet Take 1 tablet (50 mg total) by mouth 2 (two) times daily. 360 tablet 0  . traZODone (DESYREL) 100 MG tablet TAKE  ONE TABLET BY MOUTH EVERY NIGHT AT BEDTIME (Patient taking differently: TAKE ONE TABLET BY MOUTH EVERY NIGHT AT BEDTIME AS NEEDED) 90 tablet 1   No facility-administered medications prior to visit.      Allergies:   Actos [pioglitazone]; Avandia [rosiglitazone]; and Dilaudid [hydromorphone hcl]   Social History   Socioeconomic History  . Marital status: Married    Spouse name: mary lou  . Number of children: 3  . Years of education: trade   . Highest education level: None  Social Needs  . Financial resource strain: None  . Food insecurity - worry: None  . Food insecurity - inability: None  . Transportation needs - medical: None  . Transportation needs - non-medical: None  Occupational History  . Occupation: truck Geophysicist/field seismologist    Comment: retired  Tobacco Use  . Smoking status: Current Every Day Smoker    Packs/day: 1.50    Years: 54.00    Pack years: 81.00    Types: Cigarettes  . Smokeless tobacco: Never Used  . Tobacco comment: 1ppd 8.30.18 ee  Substance and Sexual Activity  . Alcohol use: No    Comment: no alcolol since 2013  . Drug use: Yes    Types: Cocaine, Marijuana    Comment: quit 1990's  . Sexual activity: Not Currently  Other Topics Concern  . 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   ROS:   Please see the history of  present illness.    Difficulty swallowing, chills, wakes up short of breath, hearing loss, cough, diarrhea, back pain, muscle pain, dizziness, excessive fatigue. All other systems reviewed and are negative.   PHYSICAL EXAM:   VS:  BP (!) 104/56   Pulse 83   Ht 5\' 11"  (1.803 m)   Wt 168 lb (76.2 kg)   BMI 23.43 kg/m    GEN: Well nourished, well developed, in no acute distress  HEENT: normal.  Redness in the manubrium and lower neck.  Skin lesion is noted just to the right of midline in his lower neck. Neck: no JVD, carotid bruits, or masses Cardiac: RRR; no murmurs, rubs, or gallops,no edema  Respiratory:  clear to auscultation bilaterally, normal work of breathing GI: soft, nontender, nondistended, + BS MS: no deformity or atrophy  Skin: warm and dry, no rash Neuro:  Alert and Oriented x 3, Strength and sensation are intact Psych: euthymic mood, full affect  Wt Readings from Last 3 Encounters:  10/18/17 168 lb (76.2 kg)  10/02/17 169 lb (76.7 kg)  08/27/17 178 lb 3.2 oz (80.8 kg)      Studies/Labs Reviewed:   EKG:  EKG normal sinus rhythm, no evidence of infarction, overall normal-appearing tracing.  Recent Labs: 05/10/2017: TSH 3.54 09/11/2017: ALT 53; BUN 35.4; Creatinine 1.5; Potassium 4.5; Sodium 139 09/18/2017: HGB 10.7; Platelets 336   Lipid Panel    Component Value Date/Time   CHOL 137 05/10/2017 0850   TRIG 99 05/10/2017 0850   HDL 35 (L) 05/10/2017 0850   CHOLHDL 3.9 05/10/2017 0850   VLDL 20 05/10/2017 0850   LDLCALC 82 05/10/2017 0850  Additional studies/ records that were reviewed today include:  None    ASSESSMENT:    1. Coronary artery disease involving native coronary artery of native heart without angina pectoris   2. Neurocardiogenic syncope   3. Essential hypertension   4. Non-small cell carcinoma of lung, stage 1 ([place])   5. 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 Coronary disease status  post prior coronary bypass surgery, currently stable.  Suspect chest discomfort could be related to esophageal irritation from radiation.  Watchful waiting for now. 2. No recurrent episodes. 3. Blood pressure is currently well controlled.  This is likely related to weight loss.  May need to have up titration of therapy as he gains his weight back in recovery.  No change in therapy.  Clinical follow-up in 1 year.  If chest discomfort becomes more concerning instead of improving he needs to notify me.    Medication Adjustments/Labs and Tests Ordered: Current medicines are reviewed at length with the patient today.  Concerns regarding medicines are outlined above.  Medication changes, Labs and Tests ordered today are listed in the Patient Instructions below. Patient Instructions  Medication Instructions:  Your physician recommends that you continue on your current medications as directed. Please refer to the Current Medication list given to you today.  Labwork: None  Testing/Procedures: None  Follow-Up: Your physician wants you to follow-up in: 9-12 months with Dr. Tamala Julian.  You will receive a reminder letter in the mail two months in advance. If you don't receive a letter, please call our office to schedule the follow-up appointment.   Any Other Special Instructions Will Be Listed Below (If Applicable).     If you need a refill on your cardiac medications before your next appointment, please call your pharmacy.      Signed, Sinclair Grooms, MD  10/18/2017 3:28 PM    [facility] [place], [place], [place]  [address] Phone: [phone]; Fax: [phone]

## 2017-10-25 ENCOUNTER — Ambulatory Visit: Payer: PPO | Admitting: Internal Medicine

## 2017-10-31 NOTE — Progress Notes (Signed)
[name]. 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  The vocal cord FU  Pain issues, if any:  Patient denies any pain.  Using a feeding tube?:  N/A  Weight changes, if any:  Patient states that he gained 8 pounds.  Swallowing issues, if any:  Denies any issues with swallowing.  Smoking or chewing tobacco? Patient states that he smokes. Smokes two packs per day.  Using fluoride trays daily? N/A  Last ENT visit was on: September 2018  Other notable issues, if any: None Vitals:   11/13/17 1025  BP: (!) 110/52  Pulse: 83  Resp: 18  Temp: 98 F (36.7 C)  TempSrc: Oral  SpO2: 100%  Weight: 174 lb 6 oz (79.1 kg)   Wt Readings from Last 3 Encounters:  11/13/17 174 lb 6 oz (79.1 kg)  10/18/17 168 lb (76.2 kg)  10/02/17 169 lb (76.7 kg)

## 2017-11-01 ENCOUNTER — Telehealth: Payer: Self-pay | Admitting: *Deleted

## 2017-11-01 ENCOUNTER — Ambulatory Visit (HOSPITAL_COMMUNITY): Payer: PPO

## 2017-11-01 NOTE — Telephone Encounter (Signed)
CALLED PATIENT TO INFORM OF STAT LABS ON 11-04-17 @ 12:45 PM @ [facility] FOR TEST ON 11-04-17, SPOKE WITH PATIENT'S WIFE MARYLOU AND SHE IS AWARE OF THIS APPT.

## 2017-11-03 ENCOUNTER — Other Ambulatory Visit: Payer: Self-pay | Admitting: Family Medicine

## 2017-11-04 ENCOUNTER — Ambulatory Visit: Payer: PPO

## 2017-11-04 ENCOUNTER — Other Ambulatory Visit: Payer: Self-pay | Admitting: Oncology

## 2017-11-04 ENCOUNTER — Ambulatory Visit (HOSPITAL_COMMUNITY)
Admission: RE | Admit: 2017-11-04 | Discharge: 2017-11-04 | Disposition: A | Discharge status: Home / Self Care | Payer: PPO | Source: Ambulatory Visit | Attending: Radiation Oncology | Admitting: Radiation Oncology

## 2017-11-04 ENCOUNTER — Other Ambulatory Visit: Payer: Self-pay | Admitting: Radiation Oncology

## 2017-11-04 ENCOUNTER — Ambulatory Visit
Admission: RE | Admit: 2017-11-04 | Discharge: 2017-11-04 | Disposition: A | Discharge status: Home / Self Care | Payer: PPO | Source: Ambulatory Visit | Attending: Radiation Oncology | Admitting: Radiation Oncology

## 2017-11-04 DIAGNOSIS — Z794 Long term (current) use of insulin: Secondary | ICD-10-CM | POA: Diagnosis not present

## 2017-11-04 DIAGNOSIS — N2889 Other specified disorders of kidney and ureter: Secondary | ICD-10-CM | POA: Diagnosis not present

## 2017-11-04 DIAGNOSIS — R911 Solitary pulmonary nodule: Secondary | ICD-10-CM | POA: Insufficient documentation

## 2017-11-04 DIAGNOSIS — C3432 Malignant neoplasm of lower lobe, left bronchus or lung: Secondary | ICD-10-CM | POA: Insufficient documentation

## 2017-11-04 DIAGNOSIS — Z7951 Long term (current) use of inhaled steroids: Secondary | ICD-10-CM | POA: Insufficient documentation

## 2017-11-04 DIAGNOSIS — Z85118 Personal history of other malignant neoplasm of bronchus and lung: Secondary | ICD-10-CM | POA: Insufficient documentation

## 2017-11-04 DIAGNOSIS — Q892 Congenital malformations of other endocrine glands: Secondary | ICD-10-CM | POA: Diagnosis not present

## 2017-11-04 DIAGNOSIS — Z7982 Long term (current) use of aspirin: Secondary | ICD-10-CM | POA: Insufficient documentation

## 2017-11-04 DIAGNOSIS — C320 Malignant neoplasm of glottis: Secondary | ICD-10-CM | POA: Insufficient documentation

## 2017-11-04 DIAGNOSIS — Z79899 Other long term (current) drug therapy: Secondary | ICD-10-CM | POA: Diagnosis not present

## 2017-11-04 DIAGNOSIS — C3411 Malignant neoplasm of upper lobe, right bronchus or lung: Secondary | ICD-10-CM

## 2017-11-04 DIAGNOSIS — J181 Lobar pneumonia, unspecified organism: Secondary | ICD-10-CM | POA: Diagnosis not present

## 2017-11-04 DIAGNOSIS — Z885 Allergy status to narcotic agent status: Secondary | ICD-10-CM | POA: Insufficient documentation

## 2017-11-04 DIAGNOSIS — K3189 Other diseases of stomach and duodenum: Secondary | ICD-10-CM

## 2017-11-04 DIAGNOSIS — Z888 Allergy status to other drugs, medicaments and biological substances status: Secondary | ICD-10-CM | POA: Diagnosis not present

## 2017-11-04 DIAGNOSIS — Z923 Personal history of irradiation: Secondary | ICD-10-CM | POA: Diagnosis not present

## 2017-11-04 LAB — COMPREHENSIVE METABOLIC PANEL
ALT: 11 U/L (ref 0–55)
AST: 11 U/L (ref 5–34)
Albumin: 3.6 g/dL (ref 3.5–5.0)
Alkaline Phosphatase: 88 U/L (ref 40–150)
Anion Gap: 9 mEq/L (ref 3–11)
BUN: 28.5 mg/dL — ABNORMAL HIGH (ref 7.0–26.0)
CALCIUM: 8.9 mg/dL (ref 8.4–10.4)
CHLORIDE: 108 meq/L (ref 98–109)
CO2: 22 mEq/L (ref 22–29)
CREATININE: 1.4 mg/dL — AB (ref 0.7–1.3)
EGFR: 52 mL/min/{1.73_m2} — AB (ref >60)
Glucose: 77 mg/dl (ref 70–140)
POTASSIUM: 4.4 meq/L (ref 3.5–5.1)
Sodium: 139 mEq/L (ref 136–145)
Total Bilirubin: <0.22 mg/dL (ref 0.20–1.20)
Total Protein: 6.9 g/dL (ref 6.4–8.3)

## 2017-11-04 MED ORDER — IOPAMIDOL (ISOVUE-300) INJECTION 61%
100.0000 mL | Freq: Once | INTRAVENOUS | Status: AC | PRN
  Administered 2017-11-04: 100 mL via INTRAVENOUS

## 2017-11-04 MED ORDER — IOPAMIDOL (ISOVUE-300) INJECTION 61%
INTRAVENOUS | Status: AC
  Filled 2017-11-04: qty 100

## 2017-11-07 ENCOUNTER — Telehealth: Payer: Self-pay | Admitting: *Deleted

## 2017-11-07 ENCOUNTER — Telehealth: Payer: Self-pay | Admitting: Urology

## 2017-11-07 NOTE — Telephone Encounter (Signed)
I called and spoke with his wife, Jobe Gibbon, to discuss the results of his most recent imaging. There was no evidence of metastatic disease in the chest, abdomen or neck.  His disease in the chest appears stable. There was limited assessment of the patient's treated right vocal cord carcinoma due to motion artifact through the larynx. His wife reports that he is no longer complaining of dysphagia, throat pain/irritation or skin irritation in the throat area. He is gradually rgaining his energy and his appetite has improved significantly. She thinks that he has likely gained a few pounds back. He has a scheduled appointment for 1 month follow-up status post completion of laryngeal radiation on Wednesday, 11/13/2017. We briefly reviewed his plans for continued follow-up going forward. We will plan to repeat a PET scan at the end of February or early March 2019. He will have a follow-up office visit to review those results and at that time, he will have nasopharyngeal laryngoscopy in the office. We will check TSH every 6-12 months and plan to alternate follow-up visits with Dr. Redmond Baseman every 3 months for the first year and then every 6 months for year 2 and every 8 months for years 3-5 with annual follow up thereafter. I offered to cancel his scheduled appointment for 11/13/17 since he appears to be doing so well but his wife prefers to keep the appointment and of course we are happy to see him. They know to call with any questions or concerns in the interim.    Nicholos Johns, PA-C

## 2017-11-07 NOTE — Telephone Encounter (Signed)
CALLED PATIENT TO INFORM OF CT FOR 11-11-17- ARRIVAL TIME - 2:45 PM @ [facility], PT. TO HAVE CLEAR LIQUIDS ONLY - 4 HRS. PRIOR TO TEST, PT. TO GET RESULTS ON 11-13-17, LVM FOR A RETURN CALL

## 2017-11-08 ENCOUNTER — Telehealth: Payer: Self-pay | Admitting: *Deleted

## 2017-11-08 ENCOUNTER — Encounter: Payer: Self-pay | Admitting: Podiatry

## 2017-11-08 ENCOUNTER — Ambulatory Visit: Payer: PPO | Admitting: Podiatry

## 2017-11-08 ENCOUNTER — Ambulatory Visit: Payer: Self-pay | Admitting: Urology

## 2017-11-08 DIAGNOSIS — E1151 Type 2 diabetes mellitus with diabetic peripheral angiopathy without gangrene: Secondary | ICD-10-CM | POA: Diagnosis not present

## 2017-11-08 DIAGNOSIS — B351 Tinea unguium: Secondary | ICD-10-CM

## 2017-11-08 DIAGNOSIS — M79676 Pain in unspecified toe(s): Secondary | ICD-10-CM

## 2017-11-08 NOTE — Telephone Encounter (Signed)
CALLED PATIENT TO LET THEM KNOW THAT CT SCAN HAS BEEN RESCHEDULED FOR 01-03-18 - ARRIVAL TIME - 2:15 PM, PT. TO BE NPO- 4 HRS. PRIOR TO TEST , TEST TO BE @ [facility], LVM FOR A RETURN CALL

## 2017-11-08 NOTE — Progress Notes (Signed)
Subjective:     Patient ID: [name], male   DOB: 1950/11/18, 67 y.o.   MRN: [id number]  HPI this patient presents to the office with chief complaint of long thick painful nails. The nails have become thick and long and are painful walking and wearing his shoe.  He is diabetic.  He presents for preventive footcare services. Patient doing well with his new diabetic shoes. Patient is under treatment for throat cancer.   Review of Systems     Objective:   Physical Exam GENERAL APPEARANCE: Alert, conversant. Appropriately groomed. No acute distress.  VASCULAR: Pedal pulses are diminished but  palpable at  [facility] and PT bilateral.  Capillary refill time is immediate to all digits,  Normal temperature gradient.  Digital hair growth is present bilateral  NEUROLOGIC: sensation is normal to 5.07 monofilament at 5/5 sites bilateral.  Light touch is intact bilateral, Muscle strength normal.  MUSCULOSKELETAL: acceptable muscle strength, tone and stability bilateral.  Intrinsic muscluature intact bilateral.  Rectus appearance of foot and digits noted bilateral. HAV  B/L.  Disdifured distal phalanx right left great toe.  DERMATOLOGIC: skin color, texture, and turgor are within normal limits.  No preulcerative lesions or ulcers  are seen, no interdigital maceration noted.  No open lesions present.   No drainage noted.  NAILS  Thick disfigured discolored nails both feet.        Assessment:     Onychomycosis  .  Diabetic neuropathy.  HAV    Plan:     Debride nails B/L   RTC 3 months for nail care.      Gardiner Barefoot DPM    Gardiner Barefoot DPM

## 2017-11-11 ENCOUNTER — Ambulatory Visit (HOSPITAL_COMMUNITY): Payer: PPO

## 2017-11-12 NOTE — Progress Notes (Signed)
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  Interval Since Last Radiation:  5.5 weeks  08/26/2017 - 10/04/2017:  The larynx was treated to 63 Gy in 28 fractions of 2.25 Gy.  09/17/2016 to 09/24/2016 SBRT Treatment:  The LLL target was treated to 54Gy in 30fractions of 18Gy  Narrative:  In summary, this is a pleasant gentleman with a history of Stage IA NSCLC, squamous cell of the left lower lobe of the lung who completed SBRT to the target in November 2017. He also has a history of Stage I, NSCLC, squamous cell of the right upper lobe, which was resected in 2013 with Dr. [name]. He was followed in surveillance which is when his most recent cancer was identified. He has been followed in surveillance since SBRT and has had several mediastinal nodes that have been stable at 9-10 mm. His follow up Chest CT on 05/06/17 revealed no new concerning lung lesions, and post radiotherapy change in the LLL. In the mediastinum, he had persistent adenopathy which was felt to be most likely reactive.  At his follow up visit on 05/07/17, he reported feeling poorly, a 20 lb unintentional weight loss, cold/heat intolerance and new onset of hoarseness of voice. He was treated with a course of antibiotics and recommended to undergo further evaluation with PET scan after completion of antibiotics.  A PET scan on 05/31/17 showed low-grade activity in a mildly enlarged right lower paratracheal lymph node with max SUV of 4.1 (formerly 4.6). There was also low-grade activity in indistinct left hilar lymph node, max SUV of 3.0. 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  Additionally, there was diffuse accentuated activity in the proximal 75% of the stomach associated with mild wall thickening but also nondistention.  This was further evaluated with endoscopy by Dr. Watt Climes and no concerning findings were noted.  He was referred to Dr. Redmond Baseman for further evaluation of the hoarseness of voice and on exam, was found to have a concerning lesion on the right vocal cord warranting biopsy.  He underwent microlaryngoscopy for biopsy on 06/28/17 with Dr. Redmond Baseman. Biopsy of the right false vocal cord showed squamous papilloma with mild squamous dysplasia. Biopsy of the right true vocal cord showed squamous papilloma with mild to focally moderate squamous dysplasia.  He underwent repeat microlaryngoscopy with CO2 laser stripping of the right true and false vocal cords on 07/19/17 with Dr. Redmond Baseman. Biopsy of the right false vocal cord showed mild squamous dysplasia. Biopsy of the right true vocal cord showed invasive squamous cell carcinoma.  This was treated with radiotherapy which was completed between 08/26/2017 - 10/04/2017.  He returns today for routine follow-up and to discuss findings from recent follow up imaging. He tolerated radiation treatment relatively well.  He experienced moderate fatigue and moderate throat pain that was internal as well as external, with dysphagia and odynophagia which was controlled with the use of Hycet prn. He did lose some weight as a result. He also  reported dry mouth, thick saliva, and taste changes. He had erythema and dry desquamation over the skin to his anterior neck. He applied Biafine cream to this area.    On recent CT chest, abdomen and neck dated 11/04/17, there was no evidence of metastatic disease in the chest, abdomen or neck and his  disease in the chest appears stable. There was limited assessment of the patient's treated right vocal cord carcinoma due to motion artifact through the larynx.                           On review of systems, the patient states that he is doing well overall.  He is no longer experiencing dysphagia, throat pain/irritation or skin irritation in the throat area. He continues with increased amount of mucus production causing him to feel the need to clear his throat almost constantly but feels that this is gradually improving. He will occasionally use Carafate for irritation after clearing his throat. He is gradually regaining his energy and his appetite has improved significantly. He has even managed to gain a few pounds back- up 8 lbs today. He denies any chest pain, increased shortness of breath, cough, hemoptysis, fevers, chills, night sweats, or unintended weight changes. He denies any bowel or bladder disturbances, and denies abdominal pain, nausea or vomiting. He denies any new musculoskeletal or joint aches or pains, new skin lesions or concerns.  ALLERGIES:  is allergic to actos [pioglitazone]; avandia [rosiglitazone]; and dilaudid [hydromorphone hcl].  Meds: Current Outpatient Medications  Medication Sig Dispense Refill  . acetaminophen (TYLENOL) 500 MG tablet Take 1,000 mg by mouth every 6 (six) hours as needed for moderate pain.    Marland Kitchen aspirin EC 81 MG tablet Take 81 mg by mouth daily.    Marland Kitchen atorvastatin (LIPITOR) 40 MG tablet Take 40 mg by mouth daily.    Marland Kitchen diltiazem (CARDIZEM CD) 120 MG 24 hr capsule Take 1 capsule (120 mg total) by mouth daily. 90 capsule 3  . diphenhydramine-acetaminophen (TYLENOL PM) 25-500 MG TABS tablet Take 1 tablet by mouth at bedtime as needed (sleep).     Marland Kitchen FLUoxetine (PROZAC) 40 MG capsule TAKE ONE CAPSULE BY MOUTH EVERY DAY 90 capsule 1  . fluticasone (FLONASE) 50 MCG/ACT nasal spray Place 2 sprays into both nostrils daily.  5  . gabapentin (NEURONTIN) 300 MG capsule  TAKE 1 CAPSULE BY MOUTH THREE TIMES DAILY 270 capsule 1  . ipratropium-albuterol (DUONEB) 0.5-2.5 (3) MG/3ML SOLN USE 1 VIAL IN NEBULIZER 4 TIMES A DAY (Patient taking differently: Inhale 1 vial every 6 hours as needed for shortness of breath) 360 mL 5  . LANTUS 100 UNIT/ML injection Inject 12 Units into the skin every morning.   3  . losartan (COZAAR) 25 MG tablet Take 25 mg by mouth daily.    . Multiple Vitamin (MULTIVITAMIN WITH MINERALS) TABS Take 1 tablet by mouth daily.    . ranitidine (ZANTAC) 150 MG capsule Take 1 capsule (150 mg total) by mouth 2 (two) times daily. (Patient taking differently: Take 150 mg by mouth daily. ) 60 capsule 11  . traZODone (DESYREL) 100 MG tablet TAKE ONE TABLET BY MOUTH EVERY NIGHT AT BEDTIME (Patient taking differently: TAKE ONE TABLET BY MOUTH EVERY NIGHT AT BEDTIME AS NEEDED) 90 tablet 1  . Docosanol (ABREVA EX) Apply 1 application topically daily as needed (cold sores).    Marland Kitchen HYDROcodone-acetaminophen (HYCET) 7.5-325 mg/15 ml solution Take 10 mLs by mouth 4 (  four) times daily as needed for moderate pain. (Patient not taking: Reported on 11/13/2017) 473 mL 0  . nitroGLYCERIN (NITROSTAT) 0.4 MG SL tablet Place 0.4 mg under the tongue every 5 (five) minutes as needed for chest pain.    Marland Kitchen sucralfate (CARAFATE) 1 g tablet Take 1 tablet (1 g total) 4 (four) times daily -  with meals and at bedtime by mouth. 5 min before meals for radiation induced esophagitis (Patient not taking: Reported on 11/13/2017) 120 tablet 2  . topiramate (TOPAMAX) 50 MG tablet Take 1 tablet (50 mg total) by mouth 2 (two) times daily. (Patient not taking: Reported on 11/13/2017) 360 tablet 0   No current facility-administered medications for this encounter.     Physical Findings:  weight is 174 lb 6 oz (79.1 kg). His oral temperature is 98 F (36.7 C). His blood pressure is 110/52 (abnormal) and his pulse is 83. His respiration is 18 and oxygen saturation is 100%.  Pain Assessment Pain Score:  0-No pain/10 In general this is a well appearing Caucasian male in no acute distress. He's alert and oriented x4 and appropriate throughout the examination. Cardiopulmonary assessment is negative for acute distress and he exhibits normal effort.   Lab Findings: Lab Results  Component Value Date   WBC 11.3 (H) 09/18/2017   HGB 10.7 (L) 09/18/2017   HCT 31.9 (L) 09/18/2017   MCV 94.8 09/18/2017   PLT 336 09/18/2017     Radiographic Findings: Ct Soft Tissue Neck W Contrast  Result Date: 11/04/2017 CLINICAL DATA:  History of stage I invasive squamous cell carcinoma of the right true vocal cord status post radiation therapy. History of lung cancer as well. EXAM: CT NECK WITH CONTRAST TECHNIQUE: Multidetector CT imaging of the neck was performed using the standard protocol following the bolus administration of intravenous contrast. CONTRAST:  18mL ISOVUE-300 IOPAMIDOL (ISOVUE-300) INJECTION 61% COMPARISON:  PET-CT 05/31/2017 FINDINGS: Pharynx and larynx: There is intermittent motion artifact through the pharynx and larynx, including at the level of the vocal cords which limits assessment. There is the suggestion of mild asymmetric nodularity at the level of the right vocal cord. No pharyngeal mass. No retropharyngeal fluid. Mild fat stranding in the neck likely reflects radiation change. Salivary glands: No inflammation, mass, or stone. Thyroid: Unremarkable. Lymph nodes: No enlarged or suspicious lymph nodes in the neck. Vascular: Major vascular structures of the neck are patent. There is moderate atherosclerotic plaque at the carotid bifurcations. Limited intracranial: Unremarkable. Visualized orbits: Unremarkable. Mastoids and visualized paranasal sinuses: Small left maxillary sinus mucous retention cyst. No significant mastoid fluid. Skeleton: No suspicious lytic or blastic osseous lesion. Upper chest: Reported separately. 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 IMPRESSION: 1. Motion artifact through the larynx, limiting assessment of the patient's treated right vocal cord carcinoma. 2. No evidence of metastatic disease in the neck. 3. Thyroglossal duct cyst. Electronically Signed   By: Logan Bores M.D.   On: 11/04/2017 15:56   Ct Chest W Contrast  Result Date: 11/04/2017 CLINICAL DATA:  Head neck carcinoma EXAM: CT CHEST, ABDOMEN  WITH CONTRAST TECHNIQUE: Multidetector CT imaging of the chest, abdomen was performed following the standard protocol during bolus administration of intravenous contrast. CONTRAST:  152mL ISOVUE-300 IOPAMIDOL (ISOVUE-300) INJECTION 61% COMPARISON:  None. FINDINGS: CT CHEST FINDINGS Cardiovascular: Coronary artery calcification and aortic atherosclerotic calcification.  Mediastinum/Nodes: No axillary or supraclavicular adenopathy. 9 mm RIGHT lower paratracheal lymph node noted. No hilar adenopathy. No pericardial fluid. Lungs/Pleura: Postsurgical change in volume loss in the RIGHT hemithorax. No suspicious nodularity. Noncalcified RIGHT upper lobe nodule measures 5 mm (image 29, series 10) and unchanged from comparison PET-CT scan. Within the LEFT lower lobe focus of peripheral consolidation measuring approximately 2.5 cm (image 78, series 10) is new. This is site of stereotactic radiosurgery. Musculoskeletal: No aggressive osseous lesion CT ABDOMEN FINDINGS Lower chest:  Lung bases are clear. Hepatobiliary: No focal hepatic lesion.  Normal gallbladder Pancreas: Normal pancreatic parenchymal intensity. No ductal dilatation or inflammation. Spleen: Normal spleen. Adrenals/urinary tract: Adrenal glands normal. Bilateral simple fluid attenuation renal lesions most consistent benign cyst Stomach/Bowel: Stomach and limited of the small bowel is unremarkable Vascular/Lymphatic: Abdominal aortic normal caliber. No retroperitoneal  periportal lymphadenopathy. Musculoskeletal: No aggressive osseous lesion IMPRESSION: 1. Small focus of consolidation in the LEFT lower lobe at site of stereotactic radiosurgery could represent postradiation change. Would also consider aspiration pneumonitis or other pulmonary infection. Malignancy less favored. Recommend follow-up CT. 2. RIGHT upper lobe pulmonary nodule unchanged from recent PET-CT scan and likely benign. 3. Stable mediastinal lymph nodes. 4. No clear evidence of metastatic disease in the chest or abdomen Electronically Signed   By: Suzy Bouchard M.D.   On: 11/04/2017 15:55   Ct Abdomen W Contrast  Result Date: 11/04/2017 CLINICAL DATA:  Head neck carcinoma EXAM: CT CHEST, ABDOMEN  WITH CONTRAST TECHNIQUE: Multidetector CT imaging of the chest, abdomen was performed following the standard protocol during bolus administration of intravenous contrast. CONTRAST:  176mL ISOVUE-300 IOPAMIDOL (ISOVUE-300) INJECTION 61% COMPARISON:  None. FINDINGS: CT CHEST FINDINGS Cardiovascular: Coronary artery calcification and aortic atherosclerotic calcification. Mediastinum/Nodes: No axillary or supraclavicular adenopathy. 9 mm RIGHT lower paratracheal lymph node noted. No hilar adenopathy. No pericardial fluid. Lungs/Pleura: Postsurgical change in volume loss in the RIGHT hemithorax. No suspicious nodularity. Noncalcified RIGHT upper lobe nodule measures 5 mm (image 29, series 10) and unchanged from comparison PET-CT scan. Within the LEFT lower lobe focus of peripheral consolidation measuring approximately 2.5 cm (image 78, series 10) is new. This is site of stereotactic radiosurgery. Musculoskeletal: No aggressive osseous lesion CT ABDOMEN FINDINGS Lower chest:  Lung bases are clear. Hepatobiliary: No focal hepatic lesion.  Normal gallbladder Pancreas: Normal pancreatic parenchymal intensity. No ductal dilatation or inflammation. Spleen: Normal spleen. Adrenals/urinary tract: Adrenal glands normal.  Bilateral simple fluid attenuation renal lesions most consistent benign cyst Stomach/Bowel: Stomach and limited of the small bowel is unremarkable Vascular/Lymphatic: Abdominal aortic normal caliber. No retroperitoneal periportal lymphadenopathy. Musculoskeletal: No aggressive osseous lesion IMPRESSION: 1. Small focus of consolidation in the LEFT lower lobe at site of stereotactic radiosurgery could represent postradiation change. Would also consider aspiration pneumonitis or other pulmonary infection. Malignancy less favored. Recommend follow-up CT. 2. RIGHT upper lobe pulmonary nodule unchanged from recent PET-CT scan and likely benign. 3. Stable mediastinal lymph nodes. 4. No clear evidence of metastatic disease in the chest or abdomen Electronically Signed   By: Suzy Bouchard M.D.   On: 11/04/2017 15:55    Impression/Plan: 1. 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 He appears to have recovered well from the effects of radiotherapy.  Recent imaging showed no evidence of metastatic disease in the chest, abdomen or neck.  His disease in the chest appears stable. There was limited  assessment of the patient's treated right vocal cord carcinoma due to motion artifact through the larynx.  We will plan to repeat a PET scan at the end of February or early March 2019 for disease restaging. He will have a follow-up office visit to review those results and at that time, he will have nasopharyngeal laryngoscopy in the office. We will check TSH every 6-12 months and plan to alternate follow-up visits with Dr. Redmond Baseman every 3 months for the first year and then every 6 months for year 2 and every 8 months for years 3-5 with annual follow up thereafter. 2. Stage IA, T1aN0squamous cell carcinoma of the right upper lobe and left lower lobe of lung. We will continue to monitor his disease with serial  CT Chest scans every 6 months with follow up visits to discuss findings.  He knows to call with any questions or concerns in the interim.     Nicholos Johns, PA-C

## 2017-11-13 ENCOUNTER — Ambulatory Visit
Admission: RE | Admit: 2017-11-13 | Discharge: 2017-11-13 | Disposition: A | Discharge status: Home / Self Care | Payer: PPO | Source: Ambulatory Visit | Attending: Urology | Admitting: Urology

## 2017-11-13 ENCOUNTER — Other Ambulatory Visit: Payer: Self-pay

## 2017-11-13 ENCOUNTER — Encounter: Payer: Self-pay | Admitting: Urology

## 2017-11-13 VITALS — BP 110/52 | HR 83 | Temp 98.0°F | Resp 18 | Wt 174.4 lb

## 2017-11-13 DIAGNOSIS — C320 Malignant neoplasm of glottis: Secondary | ICD-10-CM

## 2017-11-14 ENCOUNTER — Telehealth: Payer: Self-pay | Admitting: *Deleted

## 2017-11-14 ENCOUNTER — Ambulatory Visit: Payer: PPO

## 2017-11-14 NOTE — Telephone Encounter (Signed)
A user error has taken place: encounter opened in error, closed for administrative reasons.

## 2017-11-15 ENCOUNTER — Ambulatory Visit: Payer: PPO | Admitting: Internal Medicine

## 2017-11-19 DIAGNOSIS — E1165 Type 2 diabetes mellitus with hyperglycemia: Secondary | ICD-10-CM | POA: Diagnosis not present

## 2017-11-19 DIAGNOSIS — N183 Chronic kidney disease, stage 3 (moderate): Secondary | ICD-10-CM | POA: Diagnosis not present

## 2017-11-19 DIAGNOSIS — E1142 Type 2 diabetes mellitus with diabetic polyneuropathy: Secondary | ICD-10-CM | POA: Diagnosis not present

## 2017-11-19 DIAGNOSIS — Z794 Long term (current) use of insulin: Secondary | ICD-10-CM | POA: Diagnosis not present

## 2017-11-26 ENCOUNTER — Encounter: Payer: Self-pay | Admitting: Internal Medicine

## 2017-11-26 ENCOUNTER — Ambulatory Visit: Payer: PPO | Admitting: Internal Medicine

## 2017-11-26 VITALS — BP 110/62 | HR 97 | Ht 71.0 in | Wt 177.4 lb

## 2017-11-26 DIAGNOSIS — C3411 Malignant neoplasm of upper lobe, right bronchus or lung: Secondary | ICD-10-CM

## 2017-11-26 DIAGNOSIS — J449 Chronic obstructive pulmonary disease, unspecified: Secondary | ICD-10-CM | POA: Diagnosis not present

## 2017-11-26 DIAGNOSIS — F17200 Nicotine dependence, unspecified, uncomplicated: Secondary | ICD-10-CM

## 2017-11-26 NOTE — Progress Notes (Signed)
Subjective:     Patient ID: [name], male   DOB: 1951/05/30, 67 y.o.   MRN: [id number]  HPI  Chief Complaint  Patient presents with  . 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 Mediastinal nodes Stable Oct 2009 - July 2011; no further fu  4.RUL pulmonary nodule with stage IA non-small cell lung cancer - May 2013  - - May 2013  - 1.9cm T1a, N0, M0  Stage 1A NSCLC with 0.3cm carcinoid - s/p lobectomy - December 2013 CT chest: No recurrence - July 2014 CT chest - no recurrence. RML nodule stabvle since Dec 2013 - JAn 2016 - no recurrence   02/06/2017 Follow up : COPD/Lung cancer hx /PFT  Patient presents for a one-month follow-up. She was seen last visit with a COPD exacerbation. He was treated with antibiotics and steroids. Patient is feeling better with decreased cough, congestion. Patient had a PFT on 01/31/2017 that showed an FEV1 at 87%, ratio 64, FVC 102%, DLCO 56%. This is improved since 2013 (59% in 2013 -pre lobectomy)  Using stiolto every other day.  Hx of lung cancer , Has upcoming CT chest i   OV  07/04/2017  Chief Complaint  Patient presents with  . Follow-up    Pt c/o hoarseness x months. Pt saw Dr. Redmond Baseman and had a biopsy of vocal cord. Pt c/o prod cough with white mucus and sinus congestion. Pt states his SOB has slightly worsened as well.     Follow-up multiple. He presents with his wife    COPD: He is on inhaler therapy. He needs his flu shot but he has  sinus issues going on. He used to be obese but he has lost weight intentionally. But now he is unintentional ongoing weight loss  Smoking: He continues to smoke. He is unable to quit  Lung cancer: July 2018 he had CT scan of the chest that shows it is in complete remission I review the results but did not visualized image  New issue: For the last 2 months he said hoarseness of voice. He went and saw Dr. Redmond Baseman of ENT. Wife showed me the pictures of his ENT exam. 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 He is on observation therapy versus repeat biopsy.  New issue: Is complaining of 2 months of significant sinus congestion with clear and green drainage. Couple months ago he did try Levaquin without relief. There is no fever but he says that the sinus congestion is making for him   OV 11/26/2017  Chief Complaint  Patient presents with  . Follow-up    Pt states he has been doing good since last visit. States he finished cancer treatments and is very hoarse from that. Has complaints of congestion, coughing. Denies any SOB.  67 year old male with COPD not otherwise specified. He is stable in terms of his COPD with his inhalers.  He  is up-to-date with his flu shot.  COPD CAT score is 10 and shows good control of symptoms with minimal symptom burden.  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  He is already had right-sided cancer for which she has had lobectomy and left lower lobe cancer for which she had radiation.  Both were non-small cell lung cancer of the lung.  It is most recent CT scan of the chest November 04, 2017 and 9 mm right paratracheal node and a 5 mm right upper lobe note have been noticed.  In addition to his chronic radiation changes in the left lower lobe.  I did not personally visualized these films.   CAT COPD Symptom & Quality of Life Score (GSK  trademark) 0 is no burden. 5 is highest burden 11/26/2017   Never Cough -> Cough all the time 1  No phlegm in chest -> Chest is full of phlegm 3  No chest tightness -> Chest feels very tight 0  No dyspnea for 1 flight stairs/hill -> Very dyspneic for 1 flight of stairs 0  No limitations for ADL at home -> Very limited with ADL at home 2  Confident leaving home -> Not at all confident leaving home 0  Sleep soundly -> Do not sleep soundly because of lung condition 0  Lots of Energy -> No energy at all 4  TOTAL Score (max 40)  10          has a past medical history of Allergy, Anemia, Anginal pain ([place]), Arthritis, Asthma, Back pain, Blood transfusion, Bronchitis, Cancer ([place]) (dx'd 2013), Chronic kidney disease, COPD (chronic obstructive pulmonary disease) ([place]), Coronary artery disease, Depression, Emphysema, Family history of adverse reaction to anesthesia, GERD (gastroesophageal reflux disease), Glaucoma, H/O hiatal hernia, Hypertension, Insomnia, Lung mass, Myocardial infarct ([place]), 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   reports that he has been smoking cigarettes.  He has a 81.00 pack-year smoking history. he has never used smokeless tobacco.  Past Surgical History:  Procedure Laterality Date  . CARDIAC CATHETERIZATION  2006/2008/2009  . CARPAL TUNNEL RELEASE     bilateral  . COLONOSCOPY    . CORONARY ANGIOPLASTY WITH STENT PLACEMENT     1 stent  . ELBOW SURGERY     ulnar nerve; left  . ESOPHAGOGASTRODUODENOSCOPY    . EYE SURGERY     pt. denies  . FUDUCIAL PLACEMENT Left 08/24/2016   Procedure: PLACEMENT OF FUDUCIAL;  Surgeon: Melrose Nakayama, MD;  Location: [facility];  Service: Thoracic;  Laterality: Left;  . INGUINAL HERNIA  REPAIR  1990's   double inguinal  . LOBECTOMY  03/17/2012   upper right side with resection  . MICROLARYNGOSCOPY WITH CO2 LASER AND EXCISION OF VOCAL CORD LESION N/A 06/28/2017   Procedure: MICROLARYNGOSCOPY WITH BIOPSY;  Surgeon: Melida Quitter, MD;  Location: [facility];  Service: ENT;  Laterality: N/A;  . MICROLARYNGOSCOPY WITH CO2 LASER AND EXCISION OF VOCAL CORD LESION N/A 07/19/2017   Procedure: MICROLARYNGOSCOPY WITH CO2 LASER AND EXCISION OF VOCAL CORD LESION;  Surgeon: Melida Quitter, MD;  Location: [facility];  Service: ENT;  Laterality: N/A;  MICRO DIRECT LARYNGOSCOPY WITH CO2 LASER VOCAL CORD STRIPPING/POSS JET VENTURI VENTILATION  . REFRACTIVE SURGERY  bilaterally  . ROTATOR CUFF REPAIR     left  . VIDEO BRONCHOSCOPY WITH ENDOBRONCHIAL NAVIGATION N/A 08/24/2016   Procedure: VIDEO BRONCHOSCOPY WITH ENDOBRONCHIAL NAVIGATION;  Surgeon: Melrose Nakayama, MD;  Location: [facility];  Service: Thoracic;  Laterality: N/A;    Allergies  Allergen Reactions  . Actos [Pioglitazone] Swelling    EDEMA REACTION UNSPECIFIED  . Avandia [Rosiglitazone] Swelling    SWELLING REACTION UNSPECIFIED   . Dilaudid [Hydromorphone Hcl] Other (See Comments)    Made him crazy    Immunization History  Administered Date(s) Administered  . Influenza Split 08/21/2012, 08/25/2013  . Influenza Whole 06/06/2009, 07/07/2011  . Influenza, High Dose Seasonal PF 08/13/2017  . Influenza,inj,Quad PF,6+ Mos 10/25/2014, 08/17/2016  . Influenza-Unspecified 08/09/2010, 08/13/2017  . Pneumococcal Conjugate-13 10/02/2013  . Pneumococcal Polysaccharide-23 11/11/2008  . Tdap 09/09/2012  . Zoster 09/09/2012    Family History  Problem Relation Age of Onset  . COPD Mother   . Diabetes Mother   . Hypertension Mother   . Heart attack Father   . Diabetes Father   . Anesthesia problems Neg Hx   . Hypotension Neg Hx   . Malignant hyperthermia Neg Hx   . Pseudochol deficiency Neg Hx      Current Outpatient Medications:  .   aspirin EC 81 MG tablet, Take 81 mg by mouth daily., Disp: , Rfl:  .  atorvastatin (LIPITOR) 40 MG tablet, Take 40 mg by mouth daily., Disp: , Rfl:  .  diltiazem (CARDIZEM CD) 120 MG 24 hr capsule, Take 1 capsule (120 mg total) by mouth daily., Disp: 90 capsule, Rfl: 3 .  diphenhydramine-acetaminophen (TYLENOL PM) 25-500 MG TABS tablet, Take 1 tablet by mouth at bedtime as needed (sleep). , Disp: , Rfl:  .  Docosanol (ABREVA EX), Apply 1 application topically daily as needed (cold sores)., Disp: , Rfl:  .  FLUoxetine (PROZAC) 40 MG capsule, TAKE ONE CAPSULE BY MOUTH EVERY DAY, Disp: 90 capsule, Rfl: 1 .  fluticasone (FLONASE) 50 MCG/ACT nasal spray, Place 2 sprays into both nostrils daily., Disp: , Rfl: 5 .  gabapentin (NEURONTIN) 300 MG capsule, TAKE 1 CAPSULE BY MOUTH THREE TIMES DAILY, Disp: 270 capsule, Rfl: 1 .  ipratropium-albuterol (DUONEB) 0.5-2.5 (3) MG/3ML SOLN, USE 1 VIAL IN NEBULIZER 4 TIMES A DAY (Patient taking differently: Inhale 1 vial every 6 hours as needed for shortness of breath), Disp: 360 mL, Rfl: 5 .  LANTUS 100 UNIT/ML injection, Inject 12 Units into the skin every morning. , Disp: , Rfl: 3 .  losartan (COZAAR) 25 MG tablet, Take 25 mg by mouth daily., Disp: , Rfl:  .  Multiple Vitamin (MULTIVITAMIN WITH MINERALS) TABS, Take 1 tablet by mouth daily., Disp: , Rfl:  .  ranitidine (ZANTAC) 150 MG capsule, Take 1 capsule (150 mg total) by mouth 2 (two) times daily. (Patient taking differently: Take 150 mg by mouth daily. ), Disp: 60 capsule, Rfl: 11 .  sucralfate (CARAFATE) 1 g tablet, Take 1 tablet (1 g total) 4 (four) times daily -  with meals and at bedtime by mouth. 5 min before meals for radiation induced esophagitis, Disp: 120 tablet, Rfl: 2 .  traZODone (DESYREL) 100 MG tablet, TAKE ONE TABLET BY MOUTH EVERY NIGHT AT BEDTIME (Patient taking differently: TAKE ONE TABLET BY MOUTH EVERY NIGHT AT BEDTIME AS NEEDED), Disp: 90 tablet, Rfl: 1 .  acetaminophen (TYLENOL) 500 MG  tablet, Take 1,000 mg by mouth every 6 (six) hours as needed for moderate pain., Disp: ,  Rfl:  .  nitroGLYCERIN (NITROSTAT) 0.4 MG SL tablet, Place 0.4 mg under the tongue every 5 (five) minutes as needed for chest pain., Disp: , Rfl:    Review of Systems     Objective:   Physical Exam  Constitutional: He is oriented to person, place, and time. No distress.  lean  HENT:  Head: Normocephalic and atraumatic.  Right Ear: External ear normal.  Left Ear: External ear normal.  Mouth/Throat: Oropharynx is clear and moist. No oropharyngeal exudate.  hoasre voice XRT changes in neck skin  Eyes: Conjunctivae and EOM are normal. Pupils are equal, round, and reactive to light. Right eye exhibits no discharge. Left eye exhibits no discharge. No scleral icterus.  Neck: Normal range of motion. Neck supple. No JVD present. No tracheal deviation present. No thyromegaly present.  Cardiovascular: Normal rate, regular rhythm and intact distal pulses. Exam reveals no gallop and no friction rub.  No murmur heard. Pulmonary/Chest: Effort normal and breath sounds normal. No respiratory distress. He has no wheezes. He has no rales. He exhibits no tenderness.  Right chest wall with scar  Abdominal: Soft. Bowel sounds are normal. He exhibits no distension and no mass. There is no tenderness. There is no rebound and no guarding.  Musculoskeletal: Normal range of motion. He exhibits no edema or tenderness.  Lymphadenopathy:    He has no cervical adenopathy.  Neurological: He is alert and oriented to person, place, and time. He has normal reflexes. No cranial nerve deficit. Coordination normal.  Skin: Skin is warm and dry. No rash noted. He is not diaphoretic. No erythema. No pallor.  Psychiatric: He has a normal mood and affect. His behavior is normal. Judgment and thought content normal.  Nursing note and vitals reviewed.  Vitals:   11/26/17 0935  BP: 110/62  Pulse: 97  SpO2: 98%  Weight: 177 lb 6.4 oz  (80.5 kg)  Height: 5\' 11"  (1.803 m)    Estimated body mass index is 24.74 kg/m as calculated from the following:   Height as of this encounter: 5\' 11"  (1.803 m).   Weight as of this encounter: 177 lb 6.4 oz (80.5 kg).      Assessment:       ICD-10-CM   1. Chronic obstructive pulmonary disease, unspecified COPD type ([place]) J44.9   2. Smoking F17.200   3. 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 I understand at this point you feel it might not matter  Followup 9 months or sooner if needed    Dr. Brand Males, M.D., [facility].C.P Pulmonary and Critical Care Medicine Staff Physician, [place] Director - Interstitial Lung Disease  Program  Pulmonary [place] at [place], [place], [address]  Pager: [phone], If no answer or between  15:00h - 7:00h: call [phone] Telephone: [phone]

## 2017-11-26 NOTE — Patient Instructions (Addendum)
.     ICD-10-CM   1. Chronic obstructive pulmonary disease, unspecified COPD type ([place]) J44.9   2. Smoking F17.200   3. 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 I understand at this point you feel it might not matter  Followup 9 months or sooner if needed

## 2017-11-29 ENCOUNTER — Other Ambulatory Visit: Payer: Self-pay | Admitting: Family Medicine

## 2017-12-11 ENCOUNTER — Telehealth: Payer: Self-pay | Admitting: *Deleted

## 2017-12-11 NOTE — Telephone Encounter (Signed)
Oncology Nurse Navigator Documentation  Rec'd call from patient's wife inquiring about upcoming appts, particularly PET and labs per his recent visit 1/9 with PA Ashlyn Bruning.  II explained these appts have yet to be scheduled, will be for early March.  She questioned need for CT Chest scheduled 3/1 if PET to be done approximately same time.  I explained I would check provider notes and arrange cancellation if appropriate.  I noted pt has not had SLP follow-up since 10/16/2017, cancelled 11/14/17.  She commented he has not been conducting HEP.  I offered opportunity to attend 2/12 H&N [place] to see SLP and Nutrition, she declined b/c she has appts that morning.  I indicated I would have SLP scheduler call to schedule appt for another time.  I encouraged her to encourage husband to conduct HEP.    Gayleen Orem, RN, BSN Head & Neck Oncology Nurse [place] at [place] [phone]

## 2017-12-12 ENCOUNTER — Telehealth: Payer: Self-pay | Admitting: *Deleted

## 2017-12-12 NOTE — Telephone Encounter (Signed)
Oncology Nurse Navigator Documentation  Spoke with pt's wife, informed her CT Chest scheduled for 3/1 has been cancelled per my discussion with PA Ebony Hail since restaging PET is being scheduled for mid-March along with labs and f/u with Dr. Tammi Klippel.  She voiced understanding and appreciation for update.  Gayleen Orem, RN, BSN Head & Neck Oncology Nurse [place] at [place] [phone]

## 2017-12-16 ENCOUNTER — Other Ambulatory Visit: Payer: Self-pay | Admitting: Interventional Cardiology

## 2017-12-23 ENCOUNTER — Ambulatory Visit: Payer: PPO | Attending: Radiation Oncology

## 2017-12-23 DIAGNOSIS — R1310 Dysphagia, unspecified: Secondary | ICD-10-CM | POA: Diagnosis not present

## 2017-12-23 NOTE — Therapy (Signed)
[facility] [address] [place], [place], [address] Phone: [phone]   Fax:  [phone]  Speech Language Pathology Treatment  Patient Details  Name: [name] MRN: [id number] Date of Birth: 06-16-1951 Referring Provider: Tyler Pita, MD   Encounter Date: 12/23/2017  End of Session - 12/23/17 0849    Visit Number  4    Number of Visits  5    Date for SLP Re-Evaluation  01/02/18    SLP Start Time  0808    SLP Stop Time   0845    SLP Time Calculation (Keith)  37 Keith    Activity Tolerance  Patient tolerated treatment well       Past Medical History:  Diagnosis Date  . Allergy   . Anemia   . Anginal pain ([place])   . Arthritis    "hands"  . Asthma   . Back pain    4 deteriorating disc and receives an injection q3-43mo;has been doing this for about 43yr . Blood transfusion    as a child  . Bronchitis   . [facility] [place]   Non-small cell lung cancer  . Chronic kidney disease    acute kidney failure post surgery  . COPD (chronic obstructive pulmonary disease) (HCC)    uses Albuterol and Spiriva daily  . Coronary artery disease    has 1 stent  . Depression    takes Prozac daily  . Emphysema    sees Dr.Ramaswami for this  . Family history of adverse reaction to anesthesia    mother was hard to wake up sometimes  . GERD (gastroesophageal reflux disease)    takes Prilosec daily, EGD nml 06/2017  . Glaucoma    hx of  . H/O hiatal hernia   . Hypertension    takes  HYzaar daily  . Insomnia    takes Trazodone nightly  . Lung mass    right upper lobe  . [place]ocardial infarct ([facility]  . 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 Obesity   . Parathyroid disease ([facility]  . Periodic limb movements of sleep   . Peripheral neuropathy   . Pneumonia    hx of' 67 yo,rd' last time in 2013  . Productive cough    white in color but no odor  . Shortness of breath    with exertion   . Sleep  apnea    uses BiPaP; "no longer have sleep apnea since I have lost over 100 lbs"  . Syncope and collapse 05/26/2013  . Thyroid disease   . Type II diabetes mellitus (HCC)    takes Metformin bid and Novolog and Lantus daily    Past Surgical History:  Procedure Laterality Date  . CARDIAC CATHETERIZATION  2006/2008/2009  . CARPAL TUNNEL RELEASE     bilateral  . COLONOSCOPY    . CORONARY ANGIOPLASTY WITH STENT PLACEMENT     1 stent  . ELBOW SURGERY     ulnar nerve; left  . ESOPHAGOGASTRODUODENOSCOPY    . EYE SURGERY     pt. denies  . FUDUCIAL PLACEMENT Left 08/24/2016   Procedure: PLACEMENT OF FUDUCIAL;  Surgeon: StMelrose NakayamaMD;  [facility] Service: Thoracic;  Laterality: Left;  . INGUINAL HERNIA REPAIR  1990's   double inguinal  . LOBECTOMY  03/17/2012   upper right side with resection  . MICROLARYNGOSCOPY WITH CO2 LASER AND EXCISION OF VOCAL CORD LESION N/A  06/28/2017   Procedure: MICROLARYNGOSCOPY WITH BIOPSY;  Surgeon: Melida Quitter, MD;  Location: [facility];  Service: ENT;  Laterality: N/A;  . MICROLARYNGOSCOPY WITH CO2 LASER AND EXCISION OF VOCAL CORD LESION N/A 07/19/2017   Procedure: MICROLARYNGOSCOPY WITH CO2 LASER AND EXCISION OF VOCAL CORD LESION;  Surgeon: Melida Quitter, MD;  Location: [facility];  Service: ENT;  Laterality: N/A;  MICRO DIRECT LARYNGOSCOPY WITH CO2 LASER VOCAL CORD STRIPPING/POSS JET VENTURI VENTILATION  . REFRACTIVE SURGERY     bilaterally  . ROTATOR CUFF REPAIR     left  . VIDEO BRONCHOSCOPY WITH ENDOBRONCHIAL NAVIGATION N/A 08/24/2016   Procedure: VIDEO BRONCHOSCOPY WITH ENDOBRONCHIAL NAVIGATION;  Surgeon: Melrose Nakayama, MD;  Location: [facility];  Service: Thoracic;  Laterality: N/A;    There were no vitals filed for this visit.  Subjective Assessment - 12/23/17 0812    Subjective  Lunch - chili and toast. Voice aphonic. Pt reports he can eat steak.    Currently in Pain?  Yes    Pain Score  1     Pain Location  Throat    Pain  Orientation  Mid    Pain Descriptors / Indicators  Constant    Pain Type  Acute pain    Pain Onset  More than a month ago    Pain Frequency  Constant    Aggravating Factors   coughing    Pain Relieving Factors  meds            ADULT SLP TREATMENT - 12/23/17 0001      General Information   Behavior/Cognition  Alert;Cooperative;Pleasant mood      Treatment Provided   Treatment provided  Dysphagia      Dysphagia Treatment   Temperature Spikes Noted  No    Respiratory Status  Room air    Oral Cavity - Dentition  Dentures, top;Dentures, bottom    Treatment Methods  Skilled observation;Therapeutic exercise    Patient observed directly with PO's  Yes    Type of PO's observed  Dysphagia 3 (soft);Thin liquids    Oral Phase Signs & Symptoms  -- none noted    Pharyngeal Phase Signs & Symptoms  -- none noted    Other treatment/comments  Aphonic today. No overt s/s aspiration today. Pt reports "coughing up a bunch of nasty stuff".  SLP again had to provide usual mod cues for HEP. Pt admitted to noncompliance with HEP. SLP revuiewed with pt why he was performing HEP. SLP provided overt s/s aspiration PNA and pt told SLP three s/s with modified independence. Due to no overt s/s aspiration and pt telling SLP he is comfortable with performing HEP at home SLP will d/c pt today.       Assessment / Recommendations / Plan   Plan  Discharge SLP treatment due to (comment) pt agrees to cont HEP at home; pt safe with POs      Dysphagia Recommendations   Diet recommendations  Thin liquid diet as tolerated    Liquids provided via  Cup      Progression Toward Goals   Progression toward goals  -- see goal update - d/c today         SLP Short Term Goals - 09/19/17 1731      SLP SHORT TERM GOAL #1   Title  pt will complete HEP with rare Keith A     Status  Achieved      SLP SHORT TERM GOAL #2   Title  pt  will tell SLP why he is completing HEP     Status  Achieved      SLP SHORT TERM GOAL #3    Title  pt will tell SLP 3 overt s/s aspiration PNA with modified independence    Status  Achieved       SLP Long Term Goals - 12/23/17 0835      SLP LONG TERM GOAL #1   Title  pt will complete HEP with modified independence over two sessions     Time  --    Period  --    Status  Not Met and ongoing      SLP LONG TERM GOAL #2   Title  pt will tell SLP why a food journal is helpful in returning to most liberal diet    Status  Achieved      SLP LONG TERM GOAL #3   Title  pt will tell SLP overt s/s aspiraiton PNA with modified independence over two consecutive sessions    Time  --    Period  --    Status  Partially Met 1/2 sessions       Plan - 12/23/17 0849    Clinical Impression Statement  Pt with oropharyngeal swallowing appearedly WNL, "PNA cleared up", per wife, from last CXR. Pr reports coughing up phlegm throughout the day. SLP provided pt/wife with handout with overt s/s aspiration today, and pt provided 3 s/s with modified independence. Pt completion of HEP remains suboptimal, and SLP reminded pt of possibility of muscle fibrosis increases if pts do not complete HEP. Pt reports he is fine with d/c today, due to WNL with solids and liquids, and comfortable wiht completing HEP on his own.     Treatment/Interventions  Aspiration precaution training;Pharyngeal strengthening exercises;Diet toleration management by SLP;Compensatory techniques;Internal/external aids;SLP instruction and feedback;Patient/family education;Trials of upgraded texture/liquids    Potential to Achieve Goals  Good       Patient will benefit from skilled therapeutic intervention in order to improve the following deficits and impairments:   Dysphagia, unspecified type   SPEECH THERAPY DISCHARGE SUMMARY  Visits from Start of Care: 4  Current functional level related to goals / functional outcomes: Pt remains noncompliant with HEP, but knows the ramifications of this. He does not exhibit any overt s/s  aspiration with POs at this time. He reports he is fine with continuing HEP at home without SLP follow-up. See goal update above.   Remaining deficits: Aphonia, swallowing function appears WNL at this time.    Education / Equipment: Late effects head/neck CA on swallowing ability, s/s aspiration PNA, food journal. Plan: Patient agrees to discharge.  Patient goals were partially met. Patient is being discharged due to being pleased with the current functional level.  ?????         Problem List Patient Active Problem List   Diagnosis Date Noted  . Chronic sinusitis 07/04/2017  . 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 CAD (coronary artery disease), native coronary artery 09/18/2015  . Orthostatic hypotension 07/20/2015  . Smoking 02/26/2015  . Neurocardiogenic syncope 05/26/2013  . Seizures ([place]) 05/03/2013  . Chronic kidney disease   . Type II diabetes mellitus ([place])   . OSA on CPAP 09/26/2012  . Neuropathic pain of both legs 04/23/2012  . S/P lobectomy of lung 04/04/2012  . Non-small cell carcinoma of lung, stage 1 ([place]) 03/17/2012  Class: Stage 1  . Chest pain, atypical 12/21/2011  . COPD exacerbation ([place]) 09/20/2011  . Hypertension   . Depression   . GERD (gastroesophageal reflux disease)   . CIGARETTE SMOKER 09/25/2007  . DIZZINESS AND GIDDINESS 09/25/2007  . Chronic obstructive pulmonary disease ([place]) 08/22/2007    [facility] ,[place], [place]  12/23/2017, 9:17 AM  [facility] [address] [place], [place], [address] Phone: [phone]   Fax:  [phone]   Name: [name] MRN: [id number] Date of Birth: 01-10-1951

## 2017-12-25 ENCOUNTER — Telehealth: Payer: Self-pay | Admitting: *Deleted

## 2017-12-25 NOTE — Telephone Encounter (Signed)
CALLED PATIENT TO INFORM OF LABS ON 01-07-18 @ 8 AM AND HIS PET SCAN ON 01-07-18- ARRIVAL TIME - 8:30 AM @ [facility], PT. TO BE NPO- 6 HRS. PRIOR TO TEST , AND FU WITH ASHLYN BRUNING ON 01-15-18 @ 2:30 PM FOR RESULTS, SPOKE WITH PATIENT'S WIFE- MARYLOU AND SHE IS AWARE OF THESE APPTS.

## 2018-01-03 ENCOUNTER — Ambulatory Visit (HOSPITAL_COMMUNITY): Payer: PPO

## 2018-01-07 ENCOUNTER — Ambulatory Visit
Admission: RE | Admit: 2018-01-07 | Discharge: 2018-01-07 | Disposition: A | Discharge status: Home / Self Care | Payer: PPO | Source: Ambulatory Visit | Attending: Urology | Admitting: Urology

## 2018-01-07 ENCOUNTER — Encounter (HOSPITAL_COMMUNITY)
Admission: RE | Admit: 2018-01-07 | Discharge: 2018-01-07 | Disposition: A | Discharge status: Home / Self Care | Payer: PPO | Source: Ambulatory Visit | Attending: Urology | Admitting: Urology

## 2018-01-07 DIAGNOSIS — Z9889 Other specified postprocedural states: Secondary | ICD-10-CM | POA: Diagnosis not present

## 2018-01-07 DIAGNOSIS — I700 Atherosclerosis of aorta: Secondary | ICD-10-CM | POA: Insufficient documentation

## 2018-01-07 DIAGNOSIS — C320 Malignant neoplasm of glottis: Secondary | ICD-10-CM | POA: Insufficient documentation

## 2018-01-07 DIAGNOSIS — I2510 Atherosclerotic heart disease of native coronary artery without angina pectoris: Secondary | ICD-10-CM | POA: Diagnosis not present

## 2018-01-07 DIAGNOSIS — C3411 Malignant neoplasm of upper lobe, right bronchus or lung: Secondary | ICD-10-CM | POA: Insufficient documentation

## 2018-01-07 DIAGNOSIS — R599 Enlarged lymph nodes, unspecified: Secondary | ICD-10-CM | POA: Diagnosis not present

## 2018-01-07 LAB — GLUCOSE, CAPILLARY: Glucose-Capillary: 117 mg/dL — ABNORMAL HIGH (ref 65–99)

## 2018-01-07 MED ORDER — FLUDEOXYGLUCOSE F - 18 (FDG) INJECTION
8.8000 | Freq: Once | INTRAVENOUS | Status: AC | PRN
  Administered 2018-01-07: 8.8 via INTRAVENOUS

## 2018-01-08 LAB — TSH: TSH: 1.727 u[IU]/mL (ref 0.320–4.118)

## 2018-01-15 ENCOUNTER — Encounter: Payer: Self-pay | Admitting: *Deleted

## 2018-01-15 ENCOUNTER — Encounter: Payer: Self-pay | Admitting: Radiation Oncology

## 2018-01-15 ENCOUNTER — Other Ambulatory Visit: Payer: Self-pay

## 2018-01-15 VITALS — BP 100/61 | HR 72 | Temp 97.7°F | Resp 18 | Wt 172.6 lb

## 2018-01-15 DIAGNOSIS — C3411 Malignant neoplasm of upper lobe, right bronchus or lung: Secondary | ICD-10-CM | POA: Insufficient documentation

## 2018-01-15 DIAGNOSIS — Z8521 Personal history of malignant neoplasm of larynx: Secondary | ICD-10-CM | POA: Diagnosis not present

## 2018-01-15 DIAGNOSIS — C3432 Malignant neoplasm of lower lobe, left bronchus or lung: Secondary | ICD-10-CM | POA: Diagnosis not present

## 2018-01-15 DIAGNOSIS — Z08 Encounter for follow-up examination after completed treatment for malignant neoplasm: Secondary | ICD-10-CM | POA: Diagnosis not present

## 2018-01-15 MED ORDER — LARYNGOSCOPY SOLUTION RAD-ONC
15.0000 mL | Freq: Once | TOPICAL | Status: AC
  Administered 2018-01-15: 15 mL via TOPICAL
  Filled 2018-01-15: qty 15

## 2018-01-15 NOTE — Progress Notes (Signed)
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  Interval Since Last Radiation:  3.5 months  08/26/2017 - 10/04/2017:  The larynx was treated to 63 Gy in 28 fractions of 2.25 Gy.  09/17/2016 to 09/24/2016 SBRT Treatment:  The LLL target was treated to 54Gy in 49fractions of 18Gy  Narrative:  In summary, this is a pleasant gentleman with a history of Stage IA NSCLC, squamous cell of the left lower lobe of the lung who completed SBRT to the target in November 2017. He also has a history of Stage I, NSCLC, squamous cell of the right upper lobe, which was resected in 2013 with Dr. [name]. He was followed in surveillance which is when his most recent cancer was identified. He has been followed in surveillance since SBRT and has had several mediastinal nodes that have been stable at 9-10 mm. His follow up Chest CT on 05/06/17 revealed no new concerning lung lesions, and post radiotherapy change in the LLL. In the mediastinum, he had persistent adenopathy which was felt to be most likely reactive.  At his follow up visit on 05/07/17, he reported feeling poorly, a 20 lb unintentional weight loss, cold/heat intolerance and new onset of hoarseness of voice. He was treated with a course of antibiotics and recommended to undergo further evaluation with PET scan after completion of antibiotics.  A PET scan on 05/31/17 showed low-grade activity in a mildly enlarged right lower paratracheal lymph node with max SUV of 4.1 (formerly 4.6). There was also low-grade activity in indistinct left hilar lymph node, max SUV of 3.0. At the site of the prior superior segment left lower lobe  pulmonary nodule, there was evidence of radiation pneumonitis with low level activity characteristic of radiation fibrosis/pneumonitis, but no focal nodular residual/recurrent hypermetabolic activity.  Additionally, there was diffuse accentuated activity in the proximal 75% of the stomach associated with mild wall thickening but also nondistention.  This was further evaluated with endoscopy by Dr. Watt Climes and no concerning findings were noted.  He was referred to Dr. Redmond Baseman for further evaluation of the hoarseness of voice and on exam, was found to have a concerning lesion on the right vocal cord warranting biopsy.  He underwent microlaryngoscopy for biopsy on 06/28/17 with Dr. Redmond Baseman. Biopsy of the right false vocal cord showed squamous papilloma with mild squamous dysplasia. Biopsy of the right true vocal cord showed squamous papilloma with mild to focally moderate squamous dysplasia.  He underwent repeat microlaryngoscopy with CO2 laser stripping of the right true and false vocal cords on 07/19/17 with Dr. Redmond Baseman. Biopsy of the right false vocal cord showed mild squamous dysplasia. Biopsy of the right true vocal cord showed invasive squamous cell carcinoma.  This was treated with radiotherapy which was completed between 08/26/2017 - 10/04/2017. He tolerated radiation treatment relatively well.  He experienced moderate fatigue and moderate throat pain that was internal as well as external, with dysphagia and odynophagia which was controlled with the use of Hycet prn. He did lose some weight as a result. He also reported dry mouth, thick saliva, and taste changes. He had erythema and dry desquamation over the skin  to his anterior neck. He applied Biafine cream to this area.    On recent CT chest, abdomen and neck dated 11/04/17, there was no evidence of metastatic disease in the chest, abdomen or neck and his disease in the chest appears stable. There was limited assessment of the patient's treated right vocal cord  carcinoma due to motion artifact through the larynx.  Interval History: He returns today for routine follow-up and to discuss his PET scan from 01/07/2018. There were no typical findings to suggest metastatic head/neck cancer.  Disease stability in the chest with similar low-level hypermetabolism within a borderline enlarged right paratracheal node and presumed evolving radiation change in the superior segment of the left lower lobe.  No new lung lesions are noted.  Recent TSH was normal at 1.727 on 01/07/18.  He reports that he hasn't seen Dr. Redmond Baseman recently and doesn't have an appointment scheduled at this time.                            On review of systems, he denies any pain or difficulty associated with swallowing but continues with hoarse voice which seems to be gradually improving. He reports a chronic cough with thick sticky white sputum and scant hemoptysis following intense coughing episodes, unchanged to slightly improved recenltly. He denies shortness of breath, chest pain, fever. Chills or night sweats. He reports fatigue due to difficulty sleeping.  He reports a healthy appetite and is maintaining his weight.  ALLERGIES:  is allergic to actos [pioglitazone]; avandia [rosiglitazone]; and dilaudid [hydromorphone hcl].  Meds: Current Outpatient Medications  Medication Sig Dispense Refill  . aspirin EC 81 MG tablet Take 81 mg by mouth daily.    Marland Kitchen atorvastatin (LIPITOR) 40 MG tablet Take 40 mg by mouth daily.    Marland Kitchen diltiazem (CARDIZEM CD) 120 MG 24 hr capsule TAKE 1 CAPSULE (120 MG TOTAL) BY MOUTH DAILY. 90 capsule 3  . diphenhydramine-acetaminophen (TYLENOL PM) 25-500 MG TABS tablet Take 1 tablet by mouth at bedtime as needed (sleep).     Marland Kitchen FLUoxetine (PROZAC) 40 MG capsule TAKE ONE CAPSULE BY MOUTH EVERY DAY 90 capsule 1  . fluticasone (FLONASE) 50 MCG/ACT nasal spray Place 2 sprays into both nostrils daily.  5  . gabapentin (NEURONTIN) 300 MG capsule TAKE 1 CAPSULE BY MOUTH THREE TIMES  DAILY 270 capsule 1  . ipratropium-albuterol (DUONEB) 0.5-2.5 (3) MG/3ML SOLN USE 1 VIAL IN NEBULIZER 4 TIMES A DAY (Patient taking differently: Inhale 1 vial every 6 hours as needed for shortness of breath) 360 mL 5  . LANTUS 100 UNIT/ML injection Inject 12 Units into the skin every morning.   3  . losartan (COZAAR) 25 MG tablet Take 25 mg by mouth daily.    . Multiple Vitamin (MULTIVITAMIN WITH MINERALS) TABS Take 1 tablet by mouth daily.    . ranitidine (ZANTAC) 150 MG capsule Take 1 capsule (150 mg total) by mouth 2 (two) times daily. (Patient taking differently: Take 150 mg by mouth daily. ) 60 capsule 11  . traZODone (DESYREL) 100 MG tablet TAKE ONE TABLET BY MOUTH EVERY NIGHT AT BEDTIME 90 tablet 1  . acetaminophen (TYLENOL) 500 MG tablet Take 1,000 mg by mouth every 6 (six) hours as needed for moderate pain.    . Docosanol (ABREVA EX) Apply 1 application topically daily as needed (cold sores).    . nitroGLYCERIN (NITROSTAT) 0.4 MG SL tablet Place 0.4 mg under the tongue every 5 (  five) minutes as needed for chest pain.    Marland Kitchen sucralfate (CARAFATE) 1 g tablet Take 1 tablet (1 g total) 4 (four) times daily -  with meals and at bedtime by mouth. 5 min before meals for radiation induced esophagitis (Patient not taking: Reported on 01/15/2018) 120 tablet 2   No current facility-administered medications for this encounter.     Physical Findings:  weight is 172 lb 9.6 oz (78.3 kg). His oral temperature is 97.7 F (36.5 C). His blood pressure is 100/61 and his pulse is 72. His respiration is 18 and oxygen saturation is 98%.  Pain Assessment Pain Score: 0-No pain/10 In general this is a well appearing Caucasian male in no acute distress. He's alert and oriented x4 and appropriate throughout the examination. HEENT reveals that the patient is normocephalic, atraumatic. EOMs are intact. PERRLA. Skin is intact without any evidence of gross lesions. Cardiovascular exam reveals a regular rate and rhythm, no  clicks rubs or murmurs are auscultated. Chest is clear to auscultation bilaterally. Lymphatic assessment is performed and does not reveal any adenopathy in the cervical, supraclavicular, axillary, or inguinal chains. Abdomen has active bowel sounds in all quadrants and is intact. The abdomen is soft, non tender, non distended. Lower extremities are negative for pretibial pitting edema, deep calf tenderness, cyanosis or clubbing.   PROCEDURE NOTE: After obtaining consent and anesthetizing the nasal cavity with topical lidocaine and phenylephrine, the flexible endoscope was introduced and passed through right nostril. The nasopharynx, oropharynx, and larynx appeared normal except for prominent arytenoid edema asymmetrically with more along the left side. The edema prohibited visualization of the true vocal cords although the anterior third appeared normal.   Lab Findings: Lab Results  Component Value Date   WBC 11.3 (H) 09/18/2017   HGB 10.7 (L) 09/18/2017   HCT 31.9 (L) 09/18/2017   MCV 94.8 09/18/2017   PLT 336 09/18/2017     Radiographic Findings: Nm Pet Image Restag (ps) Skull Base To Thigh  Result Date: 01/07/2018 CLINICAL DATA:  Subsequent treatment strategy for glottic cancer. Post therapy. History of non-small-cell lung cancer 5 years ago. EXAM: NUCLEAR MEDICINE PET SKULL BASE TO THIGH TECHNIQUE: 8.8 mCi F-18 FDG was injected intravenously. Full-ring PET imaging was performed from the skull base to thigh after the radiotracer. CT data was obtained and used for attenuation correction and anatomic localization. Fasting blood glucose: 117 mg/dl Mediastinal blood pool activity: SUV max 1.8 COMPARISON:  PET 05/31/2017. Neck, chest, abdomen, pelvic CTs of 11/04/2017. FINDINGS: NECK: Muscular activity within the neck. No suspicious nodal hypermetabolism. Incidental CT findings: Bilateral carotid atherosclerosis. No cervical adenopathy. CHEST: Right paratracheal node measures 10 mm and a S.U.V. max  of 3.8 on image 70/4. Compare 1.0 and a S.U.V. max of 4.1 on the prior. Left hilar hypermetabolism, without well-defined adenopathy. This measures a S.U.V. max of 3.3 today versus a S.U.V. max of 3.0 on the prior. Superior segment left lower lobe consolidation with hypermetabolism. This is progressive compared to 05/31/2017, at the site of radiation markers. This measures a S.U.V. max of 3.0 today versus a S.U.V. max of 2.8 on the prior. Incidental CT findings: Minimal left pleural thickening. Coronary artery atherosclerosis. Mild bilateral gynecomastia. Moderate centrilobular and paraseptal emphysema. Right upper lobectomy. ABDOMEN/PELVIS: Low-level hypermetabolism about the proximal stomach measures a S.U.V. max of 6.0 today versus a S.U.V. max of 6.7 on the prior. Concurrent wall thickening, accentuated by underdistention. A focus of hypermetabolism within otherwise normal appearing right lower quadrant distal small  bowel loops is favored to be physiologic. Incidental CT findings: Bilateral adrenal thickening. Renal vascular calcifications. Bilateral low-density renal lesions are likely cysts. Abdominal aortic atherosclerosis. SKELETON: No abnormal marrow activity. Incidental CT findings: No focal osseous lesion. IMPRESSION: 1. No typical findings to suggest metastatic head neck cancer. 2. Similar low-level hypermetabolism within a borderline enlarged right paratracheal node. 3. Presumed evolving radiation change in the superior segment left lower lobe, with progressive consolidation and persistent hypermetabolism. 4. Coronary artery atherosclerosis. Aortic Atherosclerosis (ICD10-I70.0). 5. Similar appearance of the stomach, favoring gastritis. Electronically Signed   By: Abigail Miyamoto M.D.   On: 01/07/2018 14:45    Impression/Plan: 1. 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 Imaging and  laryngoscopy show no evidence of residual or metastatic disease in the head or neck and stable disease in the chest. We will plan to repeat a TSH every 6-12 months and plan to alternate follow-up visits with Dr. Redmond Baseman every 3 months for the first year and then every 6 months for year 2 and every 8 months for years 3-5 with annual follow up thereafter. 2. Stage IA, T1aN0squamous cell carcinoma of the right upper lobe and left lower lobe of lung. We will continue to monitor his disease with serial CT Chest scans every 6 months with follow up visits to discuss findings.  He knows to call with any questions or concerns in the interim.    Nicholos Johns, PA-C    Tyler Pita, MD  [facility] Direct Dial: [phone]  Fax: [phone] [email or web address]  Skype  [email or web address]    Page Me   This document serves as a record of services personally performed by Tyler Pita, MD and Freeman Caldron, PA-C. It was created on their behalf by Rae Lips, a trained medical scribe. The creation of this record is based on the scribe's personal observations and the providers' statements to them. This document has been checked and approved by the attending providers.

## 2018-01-15 NOTE — Progress Notes (Signed)
Weight and vitals stable. Denies pain. Hoarseness noted. Denies pain or difficulty associated with swallowing. Reports thick sticky white sputum. Reports scant hemoptysis following intense coughing episodes where he is trying to bring up phlegm. Denies shortness of breath. Reports fatigue due to difficulty sleeping. Reports he hasn't seen Dr. Redmond Baseman since our appointment in January and doesn't have an appointment scheduled.   BP 100/61 (BP Location: Right Arm, Patient Position: Sitting, Cuff Size: Normal)   Pulse 72   Temp 97.7 F (36.5 C) (Oral)   Resp 18   Wt 172 lb 9.6 oz (78.3 kg)   SpO2 98%   BMI 24.07 kg/m  Wt Readings from Last 3 Encounters:  01/15/18 172 lb 9.6 oz (78.3 kg)  11/26/17 177 lb 6.4 oz (80.5 kg)  11/13/17 174 lb 6 oz (79.1 kg)

## 2018-01-17 ENCOUNTER — Other Ambulatory Visit: Payer: Self-pay | Admitting: Urology

## 2018-01-17 DIAGNOSIS — C3432 Malignant neoplasm of lower lobe, left bronchus or lung: Secondary | ICD-10-CM

## 2018-01-17 DIAGNOSIS — C320 Malignant neoplasm of glottis: Secondary | ICD-10-CM

## 2018-01-18 NOTE — Progress Notes (Signed)
Oncology Nurse Navigator Documentation  To provide support, encouragement and care continuity, met with [name] for post-RT follow-up with PA Ashlyn/Dr. Manning.  He was accompanied by his wife. They voiced understanding of  3/5 PET findings, NED. They voiced understanding I will coordinate follow-up with ENT [facility] in 3 months.  I informed them of upcoming 4/8 H&N Survivorship celebration, noted information forthcoming by mail. I encouraged them to call me questions/concerns pending future follow-ups.  Rick , RN, BSN Head & Neck Oncology Nurse Navigator [facility] at [place] [phone]   

## 2018-01-20 ENCOUNTER — Ambulatory Visit: Payer: PPO

## 2018-01-21 ENCOUNTER — Telehealth: Payer: Self-pay | Admitting: *Deleted

## 2018-01-21 NOTE — Telephone Encounter (Signed)
Oncology Nurse Navigator Documentation  Per PA Ashlyn Brunning, called [facility] to arrange post-tmt follow-up.  Spoke with Cordelia Pen, requested post-tmt appt with Dr. Redmond Baseman in June including fiberoptic nasopharyngoscopy.  I noted patient to be seen by Dr. Tammi Klippel in September.  She voiced understanding.  Gayleen Orem, RN, BSN Head & Neck Oncology Nurse [place] at [place] [phone]

## 2018-01-22 ENCOUNTER — Other Ambulatory Visit: Payer: Self-pay | Admitting: Family Medicine

## 2018-01-22 MED ORDER — FLUOXETINE HCL 40 MG PO CAPS: 40.0000 mg | ORAL_CAPSULE | Freq: Every day | ORAL | 3 refills | Status: DC

## 2018-01-31 ENCOUNTER — Other Ambulatory Visit: Payer: Self-pay | Admitting: Family Medicine

## 2018-02-02 ENCOUNTER — Ambulatory Visit
Admission: RE | Admit: 2018-02-02 | Discharge: 2018-02-02 | Disposition: A | Discharge status: Home / Self Care | Payer: PPO | Source: Ambulatory Visit | Attending: Radiation Oncology | Admitting: Radiation Oncology

## 2018-02-02 DIAGNOSIS — C3432 Malignant neoplasm of lower lobe, left bronchus or lung: Secondary | ICD-10-CM

## 2018-02-02 DIAGNOSIS — C320 Malignant neoplasm of glottis: Secondary | ICD-10-CM

## 2018-02-07 ENCOUNTER — Ambulatory Visit: Payer: PPO | Admitting: Podiatry

## 2018-02-07 ENCOUNTER — Encounter: Payer: Self-pay | Admitting: Podiatry

## 2018-02-07 DIAGNOSIS — M79676 Pain in unspecified toe(s): Secondary | ICD-10-CM | POA: Diagnosis not present

## 2018-02-07 DIAGNOSIS — E1151 Type 2 diabetes mellitus with diabetic peripheral angiopathy without gangrene: Secondary | ICD-10-CM | POA: Diagnosis not present

## 2018-02-07 DIAGNOSIS — J342 Deviated nasal septum: Secondary | ICD-10-CM | POA: Diagnosis not present

## 2018-02-07 DIAGNOSIS — B351 Tinea unguium: Secondary | ICD-10-CM

## 2018-02-07 DIAGNOSIS — M2010 Hallux valgus (acquired), unspecified foot: Secondary | ICD-10-CM

## 2018-02-07 DIAGNOSIS — Z8521 Personal history of malignant neoplasm of larynx: Secondary | ICD-10-CM | POA: Diagnosis not present

## 2018-02-07 DIAGNOSIS — J341 Cyst and mucocele of nose and nasal sinus: Secondary | ICD-10-CM | POA: Diagnosis not present

## 2018-02-07 DIAGNOSIS — E1142 Type 2 diabetes mellitus with diabetic polyneuropathy: Secondary | ICD-10-CM

## 2018-02-07 DIAGNOSIS — Q892 Congenital malformations of other endocrine glands: Secondary | ICD-10-CM | POA: Insufficient documentation

## 2018-02-07 DIAGNOSIS — R490 Dysphonia: Secondary | ICD-10-CM | POA: Diagnosis not present

## 2018-02-07 NOTE — Progress Notes (Signed)
Subjective:     Patient ID: [name], male   DOB: 09-Mar-1951, 67 y.o.   MRN: [id number]  HPI this patient presents to the office with chief complaint of long thick painful nails. The nails have become thick and long and are painful walking and wearing his shoe.  He is diabetic.  He presents for preventive footcare services. Patient doing well with his new diabetic shoes. Patient is under treatment for throat cancer.   Review of Systems     Objective:   Physical Exam GENERAL APPEARANCE: Alert, conversant. Appropriately groomed. No acute distress.  VASCULAR: Pedal pulses are diminished but  palpable at  [facility] and PT bilateral.  Capillary refill time is immediate to all digits,  Normal temperature gradient.  Digital hair growth is present bilateral  NEUROLOGIC: sensation is normal to 5.07 monofilament at 5/5 sites bilateral.  Light touch is intact bilateral, Muscle strength normal.  MUSCULOSKELETAL: acceptable muscle strength, tone and stability bilateral.  Intrinsic muscluature intact bilateral.  Rectus appearance of foot and digits noted bilateral. HAV  B/L.  Disdifured distal phalanx right left great toe.  DERMATOLOGIC: skin color, texture, and turgor are within normal limits.  No preulcerative lesions or ulcers  are seen, no interdigital maceration noted.  No open lesions present.   No drainage noted.  NAILS  Thick disfigured discolored nails both feet.        Assessment:     Onychomycosis  .  Diabetic neuropathy.  HAV    Plan:     Debride nails B/L   RTC 3 months for nail care.      Gardiner Barefoot DPM    Gardiner Barefoot DPM

## 2018-02-26 ENCOUNTER — Other Ambulatory Visit: Payer: Self-pay | Admitting: Family Medicine

## 2018-04-01 ENCOUNTER — Emergency Department (HOSPITAL_COMMUNITY)
Admission: EM | Admit: 2018-04-01 | Discharge: 2018-04-01 | Disposition: A | Discharge status: Home / Self Care | Payer: PPO | Attending: Emergency Medicine | Admitting: Emergency Medicine

## 2018-04-01 ENCOUNTER — Emergency Department (HOSPITAL_COMMUNITY): Payer: PPO

## 2018-04-01 ENCOUNTER — Other Ambulatory Visit: Payer: Self-pay

## 2018-04-01 DIAGNOSIS — E079 Disorder of thyroid, unspecified: Secondary | ICD-10-CM | POA: Insufficient documentation

## 2018-04-01 DIAGNOSIS — J189 Pneumonia, unspecified organism: Secondary | ICD-10-CM | POA: Diagnosis not present

## 2018-04-01 DIAGNOSIS — Z79899 Other long term (current) drug therapy: Secondary | ICD-10-CM | POA: Insufficient documentation

## 2018-04-01 DIAGNOSIS — Z794 Long term (current) use of insulin: Secondary | ICD-10-CM | POA: Insufficient documentation

## 2018-04-01 DIAGNOSIS — E1140 Type 2 diabetes mellitus with diabetic neuropathy, unspecified: Secondary | ICD-10-CM | POA: Insufficient documentation

## 2018-04-01 DIAGNOSIS — Z923 Personal history of irradiation: Secondary | ICD-10-CM | POA: Insufficient documentation

## 2018-04-01 DIAGNOSIS — R05 Cough: Secondary | ICD-10-CM | POA: Insufficient documentation

## 2018-04-01 DIAGNOSIS — E1122 Type 2 diabetes mellitus with diabetic chronic kidney disease: Secondary | ICD-10-CM | POA: Insufficient documentation

## 2018-04-01 DIAGNOSIS — N189 Chronic kidney disease, unspecified: Secondary | ICD-10-CM | POA: Insufficient documentation

## 2018-04-01 DIAGNOSIS — I129 Hypertensive chronic kidney disease with stage 1 through stage 4 chronic kidney disease, or unspecified chronic kidney disease: Secondary | ICD-10-CM | POA: Diagnosis not present

## 2018-04-01 DIAGNOSIS — R0602 Shortness of breath: Secondary | ICD-10-CM | POA: Insufficient documentation

## 2018-04-01 DIAGNOSIS — F17210 Nicotine dependence, cigarettes, uncomplicated: Secondary | ICD-10-CM | POA: Insufficient documentation

## 2018-04-01 DIAGNOSIS — R072 Precordial pain: Secondary | ICD-10-CM | POA: Diagnosis not present

## 2018-04-01 DIAGNOSIS — R197 Diarrhea, unspecified: Secondary | ICD-10-CM | POA: Diagnosis not present

## 2018-04-01 DIAGNOSIS — J449 Chronic obstructive pulmonary disease, unspecified: Secondary | ICD-10-CM | POA: Insufficient documentation

## 2018-04-01 DIAGNOSIS — R079 Chest pain, unspecified: Secondary | ICD-10-CM | POA: Diagnosis not present

## 2018-04-01 DIAGNOSIS — F1410 Cocaine abuse, uncomplicated: Secondary | ICD-10-CM | POA: Diagnosis not present

## 2018-04-01 DIAGNOSIS — J45909 Unspecified asthma, uncomplicated: Secondary | ICD-10-CM | POA: Diagnosis not present

## 2018-04-01 DIAGNOSIS — Z7982 Long term (current) use of aspirin: Secondary | ICD-10-CM | POA: Insufficient documentation

## 2018-04-01 DIAGNOSIS — I252 Old myocardial infarction: Secondary | ICD-10-CM | POA: Insufficient documentation

## 2018-04-01 DIAGNOSIS — Z85118 Personal history of other malignant neoplasm of bronchus and lung: Secondary | ICD-10-CM | POA: Diagnosis not present

## 2018-04-01 DIAGNOSIS — F1210 Cannabis abuse, uncomplicated: Secondary | ICD-10-CM | POA: Insufficient documentation

## 2018-04-01 LAB — URINALYSIS, ROUTINE W REFLEX MICROSCOPIC
BILIRUBIN URINE: NEGATIVE
Glucose, UA: NEGATIVE mg/dL
Hgb urine dipstick: NEGATIVE
Ketones, ur: NEGATIVE mg/dL
Leukocytes, UA: NEGATIVE
Nitrite: NEGATIVE
PROTEIN: NEGATIVE mg/dL
Specific Gravity, Urine: 1.016 (ref 1.005–1.030)
pH: 5 (ref 5.0–8.0)

## 2018-04-01 LAB — CBC WITH DIFFERENTIAL/PLATELET
Abs Immature Granulocytes: 0.2 10*3/uL — ABNORMAL HIGH (ref 0.0–0.1)
BASOS ABS: 0.1 10*3/uL (ref 0.0–0.1)
BASOS PCT: 0 %
EOS ABS: 0.1 10*3/uL (ref 0.0–0.7)
Eosinophils Relative: 1 %
HCT: 34.2 % — ABNORMAL LOW (ref 39.0–52.0)
Hemoglobin: 11.4 g/dL — ABNORMAL LOW (ref 13.0–17.0)
Immature Granulocytes: 1 %
Lymphocytes Relative: 6 %
Lymphs Abs: 0.9 10*3/uL (ref 0.7–4.0)
MCH: 31.1 pg (ref 26.0–34.0)
MCHC: 33.3 g/dL (ref 30.0–36.0)
MCV: 93.2 fL (ref 78.0–100.0)
Monocytes Absolute: 0.9 10*3/uL (ref 0.1–1.0)
Monocytes Relative: 5 %
Neutro Abs: 14.6 10*3/uL — ABNORMAL HIGH (ref 1.7–7.7)
Neutrophils Relative %: 87 %
PLATELETS: 318 10*3/uL (ref 150–400)
RBC: 3.67 MIL/uL — AB (ref 4.22–5.81)
RDW: 13.6 % (ref 11.5–15.5)
WBC: 16.9 10*3/uL — AB (ref 4.0–10.5)

## 2018-04-01 LAB — COMPREHENSIVE METABOLIC PANEL
ALK PHOS: 108 U/L (ref 38–126)
ALT: 60 U/L (ref 17–63)
ANION GAP: 12 (ref 5–15)
AST: 34 U/L (ref 15–41)
Albumin: 2.7 g/dL — ABNORMAL LOW (ref 3.5–5.0)
BILIRUBIN TOTAL: 0.5 mg/dL (ref 0.3–1.2)
BUN: 46 mg/dL — ABNORMAL HIGH (ref 6–20)
CALCIUM: 9.4 mg/dL (ref 8.9–10.3)
CO2: 21 mmol/L — ABNORMAL LOW (ref 22–32)
Chloride: 105 mmol/L (ref 101–111)
Creatinine, Ser: 1.43 mg/dL — ABNORMAL HIGH (ref 0.61–1.24)
GFR calc non Af Amer: 49 mL/min — ABNORMAL LOW (ref >60)
GFR, EST AFRICAN AMERICAN: 57 mL/min — AB (ref >60)
Glucose, Bld: 213 mg/dL — ABNORMAL HIGH (ref 65–99)
Potassium: 4.3 mmol/L (ref 3.5–5.1)
SODIUM: 138 mmol/L (ref 135–145)
TOTAL PROTEIN: 6.8 g/dL (ref 6.5–8.1)

## 2018-04-01 LAB — I-STAT CG4 LACTIC ACID, ED
Lactic Acid, Venous: 0.73 mmol/L (ref 0.5–1.9)
Lactic Acid, Venous: 0.71 mmol/L (ref 0.5–1.9)

## 2018-04-01 LAB — I-STAT TROPONIN, ED: TROPONIN I, POC: 0 ng/mL (ref 0.00–0.08)

## 2018-04-01 LAB — PROTIME-INR
INR: 1.1
Prothrombin Time: 14.1 seconds (ref 11.4–15.2)

## 2018-04-01 LAB — D-DIMER, QUANTITATIVE: D-Dimer, Quant: 1.74 ug/mL-FEU — ABNORMAL HIGH (ref 0.00–0.50)

## 2018-04-01 MED ORDER — PREDNISONE 20 MG PO TABS
40.0000 mg | ORAL_TABLET | Freq: Once | ORAL | Status: AC
  Administered 2018-04-01: 40 mg via ORAL
  Filled 2018-04-01: qty 2

## 2018-04-01 MED ORDER — SODIUM CHLORIDE 0.9 % IV BOLUS
500.0000 mL | Freq: Once | INTRAVENOUS | Status: AC
  Administered 2018-04-01: 500 mL via INTRAVENOUS

## 2018-04-01 MED ORDER — AZITHROMYCIN 250 MG PO TABS
500.0000 mg | ORAL_TABLET | Freq: Once | ORAL | Status: AC
  Administered 2018-04-01: 500 mg via ORAL
  Filled 2018-04-01: qty 2

## 2018-04-01 MED ORDER — IPRATROPIUM-ALBUTEROL 0.5-2.5 (3) MG/3ML IN SOLN
3.0000 mL | Freq: Once | RESPIRATORY_TRACT | Status: AC
  Administered 2018-04-01: 3 mL via RESPIRATORY_TRACT
  Filled 2018-04-01: qty 3

## 2018-04-01 MED ORDER — SODIUM CHLORIDE 0.9 % IV SOLN
1.0000 g | Freq: Once | INTRAVENOUS | Status: AC
  Administered 2018-04-01: 1 g via INTRAVENOUS
  Filled 2018-04-01: qty 10

## 2018-04-01 NOTE — ED Provider Notes (Signed)
[facility] EMERGENCY DEPARTMENT Provider Note   CSN: [id number] Arrival date & time: 04/01/18  5284     History   Chief Complaint Chief Complaint  Patient presents with  . Chest Pain  . Cough    HPI [name] is a 67 y.o. male.  The history is provided by the patient and the spouse. No language interpreter was used.  Chest Pain   Associated symptoms include cough.  Cough  Associated symptoms include chest pain.    [name] is a 67 y.o. male who presents to the Emergency Department complaining of cough, chest pain. 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 He presents to the emergency department for evaluation of chest pain, shortness of breath and cough for the last week. A week ago he reports subjective fevers, these are now resolved. Now he has progressive left upper chest pain described as a soreness and pressure type pain. Pain is waxing and waning with no clear alleviating factors. He has a cough productive of green sputum that is progressively worsening. No hemoptysis. 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 He does also endorse ongoing diarrhea. No abdominal pain, vomiting, leg swelling or pain. He reports similar symptoms in the past with pneumonia.  Past Medical History:  Diagnosis Date  . Allergy   . Anemia   . Anginal pain ([place])   . Arthritis    "hands"  . Asthma   . Back pain    4 deteriorating disc and receives an injection q3-51mon;has been doing this for about 4yrs  . Blood transfusion    as a child  . Bronchitis   . Cancer ([place]) dx'd 2013   Non-small cell lung cancer  . Chronic kidney disease    acute kidney failure post surgery  . COPD (chronic obstructive pulmonary disease) (HCC)    uses Albuterol and Spiriva daily  . Coronary artery disease    has 1 stent  . Depression    takes Prozac daily    . Emphysema    sees Dr.Ramaswami for this  . Family history of adverse reaction to anesthesia    mother was hard to wake up sometimes  . GERD (gastroesophageal reflux disease)    takes Prilosec daily, EGD nml 06/2017  . Glaucoma    hx of  . H/O hiatal hernia   . Hypertension    takes  HYzaar daily  . Insomnia    takes Trazodone nightly  . Lung mass    right upper lobe  . Myocardial infarct ([place])   . 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 Obesity   . 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 Periodic limb movements of sleep   . Peripheral neuropathy   . Pneumonia    hx of' 67 yo,rd' last time in 2013  . Productive cough    white in color but no odor  . Shortness of breath    with exertion   . Sleep apnea    uses BiPaP; "no longer have sleep apnea since I have lost over 100 lbs"  . Syncope and collapse 05/26/2013  . Thyroid disease   . Type II diabetes mellitus (HCC)    takes Metformin bid and Novolog and Lantus daily    Patient Active Problem List   Diagnosis Date Noted  . Chronic sinusitis 07/04/2017  .  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 CAD (coronary artery disease), native coronary artery 09/18/2015  . Orthostatic hypotension 07/20/2015  . Smoking 02/26/2015  . Neurocardiogenic syncope 05/26/2013  . Seizures ([place]) 05/03/2013  . Chronic kidney disease   . Type II diabetes mellitus ([place])   . OSA on CPAP 09/26/2012  . Neuropathic pain of both legs 04/23/2012  . S/P lobectomy of lung 04/04/2012  . Non-small cell carcinoma of lung, stage 1 ([place]) 03/17/2012    Class: Stage 1  . Chest pain, atypical 12/21/2011  . COPD exacerbation ([place]) 09/20/2011  . Hypertension   . Depression   . GERD (gastroesophageal reflux disease)   . CIGARETTE SMOKER 09/25/2007  . DIZZINESS AND GIDDINESS 09/25/2007  . Chronic obstructive pulmonary disease ([place]) 08/22/2007    Past Surgical History:   Procedure Laterality Date  . CARDIAC CATHETERIZATION  2006/2008/2009  . CARPAL TUNNEL RELEASE     bilateral  . COLONOSCOPY    . CORONARY ANGIOPLASTY WITH STENT PLACEMENT     1 stent  . ELBOW SURGERY     ulnar nerve; left  . ESOPHAGOGASTRODUODENOSCOPY    . EYE SURGERY     pt. denies  . FUDUCIAL PLACEMENT Left 08/24/2016   Procedure: PLACEMENT OF FUDUCIAL;  Surgeon: Melrose Nakayama, MD;  Location: [facility];  Service: Thoracic;  Laterality: Left;  . INGUINAL HERNIA REPAIR  1990's   double inguinal  . LOBECTOMY  03/17/2012   upper right side with resection  . MICROLARYNGOSCOPY WITH CO2 LASER AND EXCISION OF VOCAL CORD LESION N/A 06/28/2017   Procedure: MICROLARYNGOSCOPY WITH BIOPSY;  Surgeon: Melida Quitter, MD;  Location: [facility];  Service: ENT;  Laterality: N/A;  . MICROLARYNGOSCOPY WITH CO2 LASER AND EXCISION OF VOCAL CORD LESION N/A 07/19/2017   Procedure: MICROLARYNGOSCOPY WITH CO2 LASER AND EXCISION OF VOCAL CORD LESION;  Surgeon: Melida Quitter, MD;  Location: [facility];  Service: ENT;  Laterality: N/A;  MICRO DIRECT LARYNGOSCOPY WITH CO2 LASER VOCAL CORD STRIPPING/POSS JET VENTURI VENTILATION  . REFRACTIVE SURGERY     bilaterally  . ROTATOR CUFF REPAIR     left  . VIDEO BRONCHOSCOPY WITH ENDOBRONCHIAL NAVIGATION N/A 08/24/2016   Procedure: VIDEO BRONCHOSCOPY WITH ENDOBRONCHIAL NAVIGATION;  Surgeon: Melrose Nakayama, MD;  Location: [facility];  Service: Thoracic;  Laterality: N/A;        Home Medications    Prior to Admission medications   Medication Sig Start Date End Date Taking? Authorizing Provider  acetaminophen (TYLENOL) 500 MG tablet Take 1,000 mg by mouth every 6 (six) hours as needed for moderate pain.    [provider]  aspirin EC 81 MG tablet Take 81 mg by mouth daily.    [provider]  atorvastatin (LIPITOR) 40 MG tablet Take 40 mg by mouth daily.    [provider]  diltiazem (CARDIZEM CD) 120 MG 24 hr capsule TAKE 1 CAPSULE (120 MG TOTAL)  BY MOUTH DAILY. 12/16/17   Sueanne Margarita, MD  diphenhydramine-acetaminophen (TYLENOL PM) 25-500 MG TABS tablet Take 1 tablet by mouth at bedtime as needed (sleep).     [provider]  Docosanol (ABREVA EX) Apply 1 application topically daily as needed (cold sores).    [provider]  FLUoxetine (PROZAC) 40 MG capsule Take 1 capsule (40 mg total) by mouth daily. 01/22/18   [name], MD  fluticasone (FLONASE) 50 MCG/ACT  nasal spray Place 2 sprays into both nostrils daily. 06/06/17   [provider]  gabapentin (NEURONTIN) 300 MG capsule TAKE 1 CAPSULE BY MOUTH THREE TIMES DAILY 01/31/18   [name], MD  ipratropium-albuterol (DUONEB) 0.5-2.5 (3) MG/3ML SOLN USE 1 VIAL IN NEBULIZER 4 TIMES A DAY Patient taking differently: Inhale 1 vial every 6 hours as needed for shortness of breath 04/11/17   Brand Males, MD  LANTUS 100 UNIT/ML injection Inject 12 Units into the skin every morning.  08/26/15   [provider]  losartan (COZAAR) 25 MG tablet Take 25 mg by mouth daily.    [provider]  Multiple Vitamin (MULTIVITAMIN WITH MINERALS) TABS Take 1 tablet by mouth daily.    [provider]  nitroGLYCERIN (NITROSTAT) 0.4 MG SL tablet Place 0.4 mg under the tongue every 5 (five) minutes as needed for chest pain.    [provider]  ranitidine (ZANTAC) 150 MG capsule Take 1 capsule (150 mg total) by mouth 2 (two) times daily. Patient taking differently: Take 150 mg by mouth daily.  05/05/13   [name], MD  sucralfate (CARAFATE) 1 g tablet Take 1 tablet (1 g total) 4 (four) times daily -  with meals and at bedtime by mouth. 5 min before meals for radiation induced esophagitis 09/09/17   Hayden Pedro, PA-C  traZODone (DESYREL) 100 MG tablet TAKE ONE TABLET BY MOUTH EVERY NIGHT AT BEDTIME 02/26/18   [name], MD    Family History Family History  Problem Relation Age of Onset  . COPD Mother   .  Diabetes Mother   . Hypertension Mother   . Heart attack Father   . Diabetes Father   . Anesthesia problems Neg Hx   . Hypotension Neg Hx   . Malignant hyperthermia Neg Hx   . Pseudochol deficiency Neg Hx     Social History Social History   Tobacco Use  . Smoking status: Current Every Day Smoker    Packs/day: 1.50    Years: 54.00    Pack years: 81.00    Types: Cigarettes  . Smokeless tobacco: Never Used  . Tobacco comment: 1ppd as of 11/26/17  ep  Substance Use Topics  . Alcohol use: No    Comment: no alcolol since 2013  . Drug use: Yes    Types: Cocaine, Marijuana    Comment: quit 1990's     Allergies   Actos [pioglitazone]; Avandia [rosiglitazone]; and Dilaudid [hydromorphone hcl]   Review of Systems Review of Systems  Respiratory: Positive for cough.   Cardiovascular: Positive for chest pain.  All other systems reviewed and are negative.    Physical Exam Updated Vital Signs BP (!) 124/53   Pulse 100   Temp 98.1 F (36.7 C) (Oral)   Resp 20   Ht 5\' 11"  (1.803 m)   Wt 74.8 kg (165 lb)   SpO2 94%   BMI 23.01 kg/m   Physical Exam  Constitutional: He is oriented to person, place, and time. He appears well-developed and well-nourished.  HENT:  Head: Normocephalic and atraumatic.  Cardiovascular: Regular rhythm.  No murmur heard. Tachycardic  Pulmonary/Chest: Effort normal. No respiratory distress.  Decreased air movement in all lung fields. Faint scattered crackles.  Abdominal: Soft. There is no tenderness. There is no rebound and no guarding.  Musculoskeletal: He exhibits no edema or tenderness.  Neurological: He is alert and oriented to person, place, and time.  Skin: Skin is warm and dry.  Psychiatric: He has a normal mood and affect. His behavior is normal.  Nursing note and vitals reviewed.    ED Treatments / Results  Labs (all labs ordered are listed, but only abnormal results are displayed) Labs Reviewed  COMPREHENSIVE METABOLIC PANEL -  Abnormal; Notable for the following components:      Result Value   CO2 21 (*)    Glucose, Bld 213 (*)    BUN 46 (*)    Creatinine, Ser 1.43 (*)    Albumin 2.7 (*)    GFR calc non Af Amer 49 (*)    GFR calc Af Amer 57 (*)    All other components within normal limits  CBC WITH DIFFERENTIAL/PLATELET - Abnormal; Notable for the following components:   WBC 16.9 (*)    RBC 3.67 (*)    Hemoglobin 11.4 (*)    HCT 34.2 (*)    Neutro Abs 14.6 (*)    Abs Immature Granulocytes 0.2 (*)    All other components within normal limits  CULTURE, BLOOD (ROUTINE X 2)  CULTURE, BLOOD (ROUTINE X 2)  PROTIME-INR  URINALYSIS, ROUTINE W REFLEX MICROSCOPIC  D-DIMER, QUANTITATIVE (NOT AT [facility])  I-STAT CG4 LACTIC ACID, ED  I-STAT TROPONIN, ED    EKG EKG Interpretation  Date/Time:  Tuesday Apr 01 2018 09:33:46 EDT Ventricular Rate:  93 PR Interval:    QRS Duration: 83 QT Interval:  341 QTC Calculation: 425 R Axis:   52 Text Interpretation:  Sinus rhythm Supraventricular bigeminy Abnormal R-wave progression, early transition Confirmed by Quintella Reichert [id number]) on 04/01/2018 9:39:52 AM   Radiology Dg Chest 2 View  Result Date: 04/01/2018 CLINICAL DATA:  Chest pain.  Cough. EXAM: CHEST - 2 VIEW COMPARISON:  PET-CT 01/07/2018. CT chest 11/04/2017. Chest x-ray 10/03/2017, 09/11/2017. FINDINGS: Postsurgical changes noted over the chest. Mediastinum and hilar structures are normal. Heart size normal. Mild infiltrate noted over the anterior lungs on lateral view only. Stable bilateral pleuroparenchymal thickening consistent with scarring. No change from multiple prior exams. IMPRESSION: 1. Mild infiltrate noted over the anterior lungs on lateral view only. This may represent a mild area of pneumonia. Followup PA and lateral chest X-ray is recommended in 3-4 weeks following trial of antibiotic therapy to ensure resolution and exclude underlying malignancy. 2. Postsurgical change noted chest. Bilateral  pleural-parenchymal thickening again noted consistent with scarring. No acute abnormality identified. Electronically Signed   By: Marcello Moores  Register   On: 04/01/2018 10:36    Procedures Procedures (including critical care time)  Medications Ordered in ED Medications  sodium chloride 0.9 % bolus 500 mL (has no administration in time range)  cefTRIAXone (ROCEPHIN) 1 g in sodium chloride 0.9 % 100 mL IVPB (has no administration in time range)  azithromycin (ZITHROMAX) tablet 500 mg (has no administration in time range)  ipratropium-albuterol (DUONEB) 0.5-2.5 (3) MG/3ML nebulizer solution 3 mL (has no administration in time range)  predniSONE (DELTASONE) tablet 40 mg (has no administration in time range)     Initial Impression / Assessment and Plan / ED Course  I have reviewed the triage vital signs and the nursing notes.  Pertinent labs & imaging results that were available during my care of the patient were reviewed by me and considered in my medical decision making (see chart for details).     Here for evaluation of chest pain, cough and shortness of breath. Chest x-ray concerning for possible developing pneumonia, CBC with leukocytosis. He was treated with IV antibiotics for CAP, steroids for possible  COPD exacerbation. Given his reports of chest pain, history of cancer, concern for possible PE. D dimer was ordered and was elevated. CTA ordered and patient agreeable with CT scan to further evaluate. Patient then eloped from the department prior to CT scan.    Final Clinical Impressions(s) / ED Diagnoses   Final diagnoses:  None    ED Discharge Orders    None       Quintella Reichert, MD 04/01/18 Johnnye Lana

## 2018-04-01 NOTE — ED Notes (Signed)
Pt ambulated to restroom with assistance from RN

## 2018-04-01 NOTE — ED Notes (Signed)
Please note: urine culture sent down with UA to main lab with instructions to hold (no order for culture at this time).

## 2018-04-01 NOTE — ED Notes (Signed)
Pt found in room getting dressed. Pt removed all attached equipment and stated he was done waiting and wanted to leave "right now". RN removed IV before pt self-D/c'd the line. Wrist band cut off prior to pt walking out. Pt advised to wait for provider but pt was determined to go home. EDP notified after pt walked out.

## 2018-04-01 NOTE — ED Notes (Signed)
Pt given urinal with instructions to provide urine specimen when possible, and was also notified we may need to draw additional blood work. Pt stated he wasn't going to provide either "because I'm going home soon".

## 2018-04-01 NOTE — ED Triage Notes (Signed)
Pt from Home vis EMS with reported CP that started yesterday. Pt has also has a cough for one week . Cough is productive with green Sputum. Pt received last radiation rx in Nov. 2018. Pt reports to being healthy up to now. Pt A/O ,no acute resp. distress.

## 2018-04-02 ENCOUNTER — Ambulatory Visit (HOSPITAL_COMMUNITY): Payer: PPO | Attending: Family Medicine

## 2018-04-02 ENCOUNTER — Telehealth: Payer: Self-pay | Admitting: Family Medicine

## 2018-04-02 ENCOUNTER — Encounter: Payer: Self-pay | Admitting: Family Medicine

## 2018-04-02 ENCOUNTER — Ambulatory Visit (INDEPENDENT_AMBULATORY_CARE_PROVIDER_SITE_OTHER): Payer: PPO | Admitting: Family Medicine

## 2018-04-02 VITALS — BP 138/78 | HR 64 | Temp 98.3°F | Resp 20 | Ht 71.0 in | Wt 159.0 lb

## 2018-04-02 DIAGNOSIS — R0602 Shortness of breath: Secondary | ICD-10-CM | POA: Diagnosis not present

## 2018-04-02 DIAGNOSIS — Z794 Long term (current) use of insulin: Secondary | ICD-10-CM

## 2018-04-02 DIAGNOSIS — J189 Pneumonia, unspecified organism: Secondary | ICD-10-CM

## 2018-04-02 DIAGNOSIS — E1159 Type 2 diabetes mellitus with other circulatory complications: Secondary | ICD-10-CM | POA: Diagnosis not present

## 2018-04-02 DIAGNOSIS — C3411 Malignant neoplasm of upper lobe, right bronchus or lung: Secondary | ICD-10-CM

## 2018-04-02 DIAGNOSIS — C3432 Malignant neoplasm of lower lobe, left bronchus or lung: Secondary | ICD-10-CM | POA: Diagnosis not present

## 2018-04-02 DIAGNOSIS — R7989 Other specified abnormal findings of blood chemistry: Secondary | ICD-10-CM

## 2018-04-02 DIAGNOSIS — J441 Chronic obstructive pulmonary disease with (acute) exacerbation: Secondary | ICD-10-CM

## 2018-04-02 DIAGNOSIS — C320 Malignant neoplasm of glottis: Secondary | ICD-10-CM

## 2018-04-02 DIAGNOSIS — F17200 Nicotine dependence, unspecified, uncomplicated: Secondary | ICD-10-CM | POA: Diagnosis not present

## 2018-04-02 LAB — BASIC METABOLIC PANEL WITH GFR
BUN / CREAT RATIO: 27 (calc) — AB (ref 6–22)
BUN: 36 mg/dL — AB (ref 7–25)
CO2: 26 mmol/L (ref 20–32)
Calcium: 9.3 mg/dL (ref 8.6–10.3)
Chloride: 104 mmol/L (ref 98–110)
Creat: 1.33 mg/dL — ABNORMAL HIGH (ref 0.70–1.25)
GFR, Est African American: 63 mL/min/{1.73_m2} (ref >=60)
GFR, Est Non African American: 55 mL/min/{1.73_m2} — ABNORMAL LOW (ref >=60)
GLUCOSE: 236 mg/dL — AB (ref 65–99)
Potassium: 4.7 mmol/L (ref 3.5–5.3)
Sodium: 139 mmol/L (ref 135–146)

## 2018-04-02 MED ORDER — HYDROCODONE-HOMATROPINE 5-1.5 MG/5ML PO SYRP: 5.0000 mL | ORAL_SOLUTION | Freq: Three times a day (TID) | ORAL | 0 refills | Status: DC | PRN

## 2018-04-02 MED ORDER — IPRATROPIUM-ALBUTEROL 0.5-2.5 (3) MG/3ML IN SOLN
3.0000 mL | Freq: Once | RESPIRATORY_TRACT | Status: AC
  Administered 2018-04-02: 3 mL via RESPIRATORY_TRACT

## 2018-04-02 MED ORDER — ALBUTEROL SULFATE HFA 108 (90 BASE) MCG/ACT IN AERS: 2.0000 | INHALATION_SPRAY | Freq: Four times a day (QID) | RESPIRATORY_TRACT | 0 refills | Status: DC | PRN

## 2018-04-02 MED ORDER — ALBUTEROL SULFATE (2.5 MG/3ML) 0.083% IN NEBU: 2.5000 mg | INHALATION_SOLUTION | RESPIRATORY_TRACT | 1 refills | Status: DC | PRN

## 2018-04-02 MED ORDER — BENZONATATE 100 MG PO CAPS: 100.0000 mg | ORAL_CAPSULE | Freq: Three times a day (TID) | ORAL | 0 refills | Status: DC | PRN

## 2018-04-02 MED ORDER — PREDNISONE 20 MG PO TABS: ORAL_TABLET | ORAL | 0 refills | Status: DC

## 2018-04-02 MED ORDER — LEVOFLOXACIN 500 MG PO TABS: 500.0000 mg | ORAL_TABLET | Freq: Every day | ORAL | 0 refills | Status: DC

## 2018-04-02 MED ORDER — ALBUTEROL SULFATE (2.5 MG/3ML) 0.083% IN NEBU: 2.5000 mg | INHALATION_SOLUTION | Freq: Once | RESPIRATORY_TRACT | Status: DC

## 2018-04-02 NOTE — Progress Notes (Signed)
Kidney function is a little better than yesterday when he was in the ER, but he still looks like he may be dehydrated, so increase drinking clear fluids low in sugar.  Glucose is still elevated at 236 after doing your lantus, so I would not hold lantus in the mornings anymore.  Check sugars morning and night, or any time during the day if you feel symptomatic (weak jittery confused sweaty hungry weak or tired sudden changes in behavior, check your sugar and if it is under 70, bring it up by drink 4 ounces of fruit juice or use 3-4 glucose tablets).  But if your sugars are over 300 on average are going to become more dehydrated.  Also with your steroids you should not be stopping insulin because your sugars will be much higher than normal, and you may need to increase the Lantus dose in the mornings while taking the steroids)  I would come in Friday to be rechecked before the weekend.    Hope you got your CT angio done today.  We discussed that if you make decisions that are [place] then you are responsible for any possible consequences, such as choosing not to get the CT angios done today could leave undetected blood clots in lungs that can lead to serious morbidity or mortality, ICU admissions or death etc.) My recommendation here that I reviewed with you was to get this done because of your high heart rate with ambulation and because of your positive d-dimer test.  The consequences of untreated pulmonary embolisms is so serious that we cannot just wait and treat your possible pneumonia or COPD exacerbation.  The more serious condition must be ruled out first while we are treating the other possible causes of your shortness of breath and chest pain.

## 2018-04-02 NOTE — Telephone Encounter (Signed)
Called patient and spoke with patient's wife and informed her that [name] ordered a STAT CT angio of chest for patient and he needs to go over to [facility] as soon as possible. Patient wife verbalized understanding and stated that she will try to get him there. Informed her that it is imperative that patient go now . She states she would get him there soon.

## 2018-04-02 NOTE — Progress Notes (Signed)
Patient ID: [name], male    DOB: Mar 16, 1951, 67 y.o.   MRN: [id number]  PCP: [name], MD  Chief Complaint  Patient presents with  . Pneumonia    Subjective:   [name] is a 67 y.o. 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   He did have associated chest pain yesterday morning upon waking, but he does not have that today.  He did go to the ER yesterday morning when he woke up with chest pressure and difficulty breathing.  He was evaluated had labs done, EKG, chest x-ray and was evaluated by the ER physician, he had received Rocephin, azithromycin, prednisone and breathing treatment and was waiting for a CT scan of his chest but he was frustrated and he left AMA.   He went home with his wife, they did not have any prescriptions secondary to leaving AMA, he has DuoNeb's and a nebulizer at home but is not done any breathing treatments, and he presents here for evaluation of continued symptoms.  Patient complains of profuse postnasal drip, chest congestion with productive cough, severe and forceful, feels like he is drowning, sputum is green to gray.  Coughing and sputum production is increased from his baseline.   He is having difficult time sleeping and has to sleep sitting up because of postnasal drip and chest congestion.  His ribs and upper abs are sore on both sides but denies any inspiratory chest pain, back pain.  He denies any chest pressure currently.  Denies palpitations, near-syncope.  Dyspnea on exertion is worse than  his baseline but he currently does not feel short of breath when at rest when not having coughing fits.  Endorses associated wheeze, chest tightness, subjective fever, chills, sweats, decreased energy, decreased appetite.  He denies hemoptysis, weight loss, hoarse voice unchanged from his baseline, denies back pain, abdominal pain, nausea, vomiting or diarrhea.  He denies any decreased urine output.  Patient is wife state they have been out of a lot of his medications.  They do have DuoNeb's at home, but no other inhalers for his COPD.  He has been feeling so ill over the past week and a half that he has his insulin but has been skipping doses or not using it because he is not eating.   ER records, labs and imaging have been reviewed at length.  Patient had leukocytosis, WBC 16.9, (labs obtained prior to starting steroids), mild hyperglycemia, increase in BUN from his baseline, creatinine appears to be at baseline, chest x-ray showed possible infiltrate anteriorly in lungs only visualized on the lateral view of x-ray and not on PA view.  Chest x-ray showed stable bilateral pleural-parenchymal thickening consistent with his scarring and history of procedures.  EKG was unremarkable.  Lactic acid was negative x2.  BCx2 pending, UA negative, Troponin negative, D-dimer positive 1.74, which prompted CT angio chest, which pt did not complete, leaving AMA.  Flowsheet of vital signs from his ER visit show some periods of tachypnea, max RR 27, he was on 2 L nasal cannula at presentation brought in via ambulance  and on 2 L percent of this time while being evaluated in the ER, but prior to leaving patient was 92% on room air with respirations 20, HR 64, afebrile.   Chest pain is better than it was yesterday morning.  His breathing, cough, fatigue is all largely unchanged.  Wife states he was unable to sleep at all last night because of the severe cough. Denies any lower extremity swelling, near-syncope, palpitations.        Patient Active Problem List   Diagnosis Date Noted  . Chronic sinusitis 07/04/2017  . Primary cancer of glottis ([place])   . 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 CAD (coronary artery disease), native coronary artery 09/18/2015  . Orthostatic hypotension 07/20/2015  . Smoking 02/26/2015  . Neurocardiogenic syncope 05/26/2013  . Seizures ([place]) 05/03/2013  . Chronic kidney disease   . Type II diabetes mellitus ([place])   . OSA on CPAP 09/26/2012  . Neuropathic pain of both legs 04/23/2012  . S/P lobectomy of lung 04/04/2012  . Non-small cell carcinoma of lung, stage 1 ([place]) 03/17/2012    Class: Stage 1  . Chest pain, atypical 12/21/2011  . COPD exacerbation ([place]) 09/20/2011  . Hypertension   . Depression   . GERD (gastroesophageal reflux disease)   . CIGARETTE SMOKER 09/25/2007  . DIZZINESS AND GIDDINESS 09/25/2007  . Chronic obstructive pulmonary disease ([place]) 08/22/2007     Prior to Admission medications   Medication Sig Start Date End Date Taking? Authorizing Provider  acetaminophen (TYLENOL) 500 MG tablet Take 1,000 mg by mouth every 6 (six) hours as needed for moderate pain.   Yes [provider]  aspirin EC 81 MG tablet Take 81 mg by mouth daily.   Yes [provider]  atorvastatin (LIPITOR) 40 MG tablet Take 40 mg by mouth daily.   Yes [provider]  diltiazem (CARDIZEM CD) 120 MG 24 hr capsule TAKE 1 CAPSULE (120 MG TOTAL) BY MOUTH DAILY. 12/16/17  Yes Turner, Eber Hong, MD  diphenhydramine-acetaminophen (TYLENOL PM) 25-500 MG TABS tablet Take 1 tablet by mouth at bedtime as needed (sleep).    Yes [provider]  Docosanol (ABREVA EX) Apply 1 application topically daily as needed (cold sores).   Yes [provider]  FLUoxetine (PROZAC) 40 MG capsule Take 1 capsule (40 mg total) by mouth daily. 01/22/18  Yes [name], MD  fluticasone (FLONASE) 50 MCG/ACT nasal spray Place 2  sprays into both nostrils as needed.  06/06/17  Yes [provider]  gabapentin (NEURONTIN) 300 MG capsule TAKE 1 CAPSULE BY MOUTH THREE TIMES DAILY 01/31/18  Yes [name], MD  ipratropium-albuterol (DUONEB) 0.5-2.5 (3) MG/3ML SOLN USE 1 VIAL IN NEBULIZER 4 TIMES A DAY Patient taking differently: Inhale 1 vial every 6 hours as needed for shortness of breath 04/11/17  Yes Brand Males, MD  LANTUS 100 UNIT/ML injection Inject 8 Units into the skin every morning.  08/26/15  Yes [provider]  losartan (COZAAR) 25 MG tablet Take 25 mg by mouth daily.   Yes [provider]  Multiple Vitamin (MULTIVITAMIN WITH MINERALS) TABS Take 1 tablet by mouth daily.   Yes [provider]  nitroGLYCERIN (NITROSTAT) 0.4 MG SL tablet Place 0.4 mg under the tongue every 5 (five) minutes as needed for chest pain.   Yes [provider]  ranitidine (ZANTAC) 150 MG capsule Take 1 capsule (150 mg total) by mouth 2 (  two) times daily. Patient taking differently: Take 150 mg by mouth daily.  05/05/13  Yes [name], MD  sucralfate (CARAFATE) 1 g tablet Take 1 tablet (1 g total) 4 (four) times daily -  with meals and at bedtime by mouth. 5 min before meals for radiation induced esophagitis 09/09/17  Yes Hayden Pedro, PA-C  traZODone (DESYREL) 100 MG tablet TAKE ONE TABLET BY MOUTH EVERY NIGHT AT BEDTIME 02/26/18  Yes [name], MD  albuterol (PROVENTIL HFA;VENTOLIN HFA) 108 (90 Base) MCG/ACT inhaler Inhale 2 puffs into the lungs every 6 (six) hours as needed for wheezing or shortness of breath. 04/02/18   Delsa Grana, PA-C  albuterol (PROVENTIL) (2.5 MG/3ML) 0.083% nebulizer solution Take 3 mLs (2.5 mg total) by nebulization every 4 (four) hours as needed for wheezing or shortness of breath. 04/02/18   Delsa Grana, PA-C  benzonatate (TESSALON) 100 MG capsule Take 1 capsule (100 mg total) by mouth 3 (three) times daily as needed for cough. 04/02/18   Delsa Grana, PA-C  HYDROcodone-homatropine (HYCODAN) 5-1.5 MG/5ML syrup Take 5 mLs by mouth every 8 (eight) hours as needed for cough. 04/02/18   Delsa Grana, PA-C  levofloxacin (LEVAQUIN) 500 MG tablet Take 1 tablet (500 mg total) by mouth daily. 04/02/18   Delsa Grana, PA-C  predniSONE (DELTASONE) 20 MG tablet 2 tabs poqday 1-3, 1 tabs poqday 4-6 04/02/18   Delsa Grana, PA-C     Allergies  Allergen Reactions  . Actos [Pioglitazone] Swelling    EDEMA REACTION UNSPECIFIED  . Avandia [Rosiglitazone] Swelling    SWELLING REACTION UNSPECIFIED   . Dilaudid [Hydromorphone Hcl] Other (See Comments)    Made him crazy     Family History  Problem Relation Age of Onset  . COPD Mother   . Diabetes Mother   . Hypertension Mother   . Heart attack Father   . Diabetes Father   . Anesthesia problems Neg Hx   . Hypotension Neg Hx   . Malignant hyperthermia Neg Hx   . Pseudochol deficiency Neg Hx      Social History   Socioeconomic History  . Marital status: Married    Spouse name: mary lou  . Number of children: 3  . Years of education: trade   . Highest education level: Not on file  Occupational History  . 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 Financial resource strain: Not on file  . Food insecurity:    Worry: Not on file    Inability: Not on file  . Transportation needs:    Medical: Not on file    Non-medical: Not on file  Tobacco Use  . Smoking status: Current Every Day Smoker    Packs/day: 1.50    Years: 54.00    Pack years: 81.00    Types: Cigarettes  . Smokeless tobacco: Never Used  . Tobacco comment: 1ppd as of 11/26/17  ep  Substance and Sexual Activity  . Alcohol use: No    Comment: no alcolol since 2013  . Drug use: Yes    Types: Cocaine, Marijuana    Comment: quit 1990's  . Sexual activity: Not Currently  Lifestyle  . Physical activity:    Days per week: Not on file    Minutes per session: Not on file  . Stress: Not on file    Relationships  . Social connections:    Talks on phone: Not on file    Gets together: Not  on file    Attends religious service: Not on file    Active member of club or organization: Not on file    Attends meetings of clubs or organizations: Not on file    Relationship status: Not on file  . Intimate partner violence:    Fear of current or ex partner: Not on file    Emotionally abused: Not on file    Physically abused: Not on file    Forced sexual activity: Not on file  Other Topics Concern  . 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  HENT: Positive for congestion, postnasal drip and rhinorrhea.   Eyes: Negative.   Respiratory: Positive for cough, chest tightness, shortness of breath and wheezing. Negative for apnea, choking and stridor.   Cardiovascular: Negative.  Negative for chest pain, palpitations and leg swelling.  Gastrointestinal: Negative.  Negative for abdominal pain, diarrhea, nausea and vomiting.  Genitourinary: Negative.  Negative for decreased urine volume.  Musculoskeletal: Negative.   Skin: Negative for color change and rash.  Neurological: Negative for dizziness, syncope, weakness, light-headedness and headaches.  Hematological: Negative.   Psychiatric/Behavioral: Negative.  Negative for confusion.  All other systems reviewed and are negative.      Objective:    Vitals:   04/02/18 1200  BP: 138/78  Pulse: 64  Resp: 20  Temp: 98.3 F (36.8 C)  TempSrc: Oral  SpO2: 95%  Weight: 159 lb (72.1 kg)  Height: 5\' 11"  (1.803 m)      Physical Exam  Constitutional: He appears well-developed. No distress. He is not intubated.  Thin, cachectic male, chronically ill-appearing,  appears older than stated age, no acute distress  HENT:  Head: Normocephalic and atraumatic.  Nose: Nose normal.  Raspy whispered voice -baseline status post radiation to vocal cords Posterior oropharynx mildly erythematous, mildly edematous with uvula midline, postnasal drip present Nasal mucosa mildly erythematous, no edema No cervical lymphadenopathy MM dry  Eyes: Pupils are equal, round, and reactive to light. Conjunctivae are normal. Right eye exhibits no discharge. Left eye exhibits no discharge. No scleral icterus.  Neck: No tracheal deviation present.  Cardiovascular: Normal rate, regular rhythm and intact distal pulses. Exam reveals no gallop and no friction rub.  No murmur heard. No lower extremity edema  Pulmonary/Chest: No accessory muscle usage or stridor. Tachypnea noted. He is not intubated. He has decreased breath sounds. He has no wheezes. He has rhonchi. He has no rales.  Frequent severe productive coughing fits with green sputum Diminished breath sounds, more severe mid to lower lung fields, increased AP diameter  Abdominal: Soft. Bowel sounds are normal. He exhibits no distension. There is no tenderness. There is no guarding.  Musculoskeletal: Normal range of motion.  Neurological: He is alert. He exhibits normal muscle tone. Coordination normal.  Skin: Skin is warm, dry and intact. No rash noted. He is not diaphoretic. No cyanosis.  Patient appears ashy in his face but no pallor or cyanosis in extremities or intraorally  Psychiatric: He has a normal mood and affect. His behavior is normal.  Nursing note and vitals reviewed.         Assessment & Plan:  ICD-10-CM   1. Shortness of breath R06.02 CT Angio Chest W/Cm &/Or Wo Cm    levofloxacin (LEVAQUIN) 500 MG tablet    predniSONE (DELTASONE) 20 MG tablet    BASIC METABOLIC PANEL WITH GFR    albuterol (PROVENTIL) (2.5 MG/3ML) 0.083% nebulizer solution    albuterol (PROVENTIL HFA;VENTOLIN HFA) 108 (90 Base)  MCG/ACT inhaler    ipratropium-albuterol (DUONEB) 0.5-2.5 (3) MG/3ML nebulizer solution 3 mL    albuterol (PROVENTIL) (2.5 MG/3ML) 0.083% nebulizer solution 2.5 mg    CANCELED: Basic Metabolic Panel  2. Community acquired pneumonia of right lung, unspecified part of lung J18.9 levofloxacin (LEVAQUIN) 500 MG tablet    BASIC METABOLIC PANEL WITH GFR    albuterol (PROVENTIL) (2.5 MG/3ML) 0.083% nebulizer solution    albuterol (PROVENTIL HFA;VENTOLIN HFA) 108 (90 Base) MCG/ACT inhaler    benzonatate (TESSALON) 100 MG capsule    HYDROcodone-homatropine (HYCODAN) 5-1.5 MG/5ML syrup    ipratropium-albuterol (DUONEB) 0.5-2.5 (3) MG/3ML nebulizer solution 3 mL  3. COPD with acute exacerbation (HCC) J44.1 levofloxacin (LEVAQUIN) 500 MG tablet    predniSONE (DELTASONE) 20 MG tablet    albuterol (PROVENTIL) (2.5 MG/3ML) 0.083% nebulizer solution    albuterol (PROVENTIL HFA;VENTOLIN HFA) 108 (90 Base) MCG/ACT inhaler    HYDROcodone-homatropine (HYCODAN) 5-1.5 MG/5ML syrup    ipratropium-albuterol (DUONEB) 0.5-2.5 (3) MG/3ML nebulizer solution 3 mL  4. Positive D-dimer R79.89 CT Angio Chest W/Cm &/Or Wo Cm  5. Smoking F17.200   6. Type 2 diabetes mellitus with other circulatory complication, with long-term current use of insulin (HCC) E11.59    Z79.4   7. 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 Primary cancer of glottis ([place]) C32.0   9. Non-small cell carcinoma of lung, stage 1 (HCC) C34.11     SOB with cough, did have chest pain yesterday and initially presented to the ER via EMS, but left AMA.  Your work-up showed leukocytosis, possible infiltrate on x-ray although only seen on lateral view and not AP view, patient had negative troponin and EKG unremarkable, no concern for CAD.  He does appear slightly dry, labs from yesterday consistent with this, no concern for CHF.  Chest x-ray with not suspicious for pleural effusion.  Differential for his continued shortness  of breath includes community-acquired pneumonia, COPD exacerbation, PE, and it may be multifactorial with history of multiple lung cancers, radiation, lobectomies, larynx cancer, may be acute on chronic respiratory failure, he is also not using any of his COPD medications and is out of his maintenance inhalers.  I discussed with patient and his wife how important it is to obtain a CT angio to rule out PE.  They were not given prescriptions because he left AMA, so will prescribe burst of steroids, change antibiotics to Levaquin, prescribe nebulizer treatments and refill maintenance inhalers for COPD, give cough suppressant.  Will repeat BMP to reevaluate kidney function, he was given a 500 mL bolus of normal saline yesterday, would hope for improvement of kidney function so he may be able to tolerate contrasted study.  She was given a nebulizer with DuoNeb plus additional albuterol and reevaluated afterwards.  Breath sounds improved and he was able to cough up profuse amounts of sputum.  His coughing fits decreased in frequency and severity.  At rest he had no increased work of breathing, no retractions, no tachypnea he was ambulated with pulse oximetry.  Patient was ambulated with pulse oximetry, starting heart rate was 50's, Sp02 92%, with ambulation HR increased to 124, Sp02 89%, pt able to have a conversation while walking around the clinic several times.    Feel he is stable to get a stat CTA done in the next few hours and continue treatment as outlined above.  Reviewed plan with patient and with his wife.  Patient stated " I should have just stayed in the ER and on this yesterday" I agreed with.  However with positive d-dimer  test, I explained how we needed to proceed with further testing to ro PE.    Reviewed medications, inhalers, plan of treatment, ER precautions advised and urged close follow-up in clinic care, patient and wife verbalized understanding.  Stat labs obtained prior to patient leaving.  Pt extremely high risk of morbidity/mortality with current and PMHx, hx of non-compliance, I spent greater than 45 minutes with the patient today. Over 50% of this time was spent with counseling and educating the patient.    Delsa Grana, PA-C 04/02/18 7:01 PM

## 2018-04-02 NOTE — Patient Instructions (Addendum)
Still concerned the PE (blood clot in lungs) needs to be ruled out with positive blood tests.  We will treat COPD and pneumonia more aggressively

## 2018-04-06 LAB — CULTURE, BLOOD (ROUTINE X 2)
Culture: NO GROWTH
Culture: NO GROWTH
Special Requests: ADEQUATE
Special Requests: ADEQUATE

## 2018-04-23 ENCOUNTER — Telehealth: Payer: Self-pay | Admitting: *Deleted

## 2018-04-23 DIAGNOSIS — F17200 Nicotine dependence, unspecified, uncomplicated: Secondary | ICD-10-CM | POA: Diagnosis not present

## 2018-04-23 DIAGNOSIS — Z8521 Personal history of malignant neoplasm of larynx: Secondary | ICD-10-CM | POA: Diagnosis not present

## 2018-04-23 DIAGNOSIS — Q892 Congenital malformations of other endocrine glands: Secondary | ICD-10-CM | POA: Diagnosis not present

## 2018-04-23 DIAGNOSIS — R490 Dysphonia: Secondary | ICD-10-CM | POA: Diagnosis not present

## 2018-04-23 NOTE — Telephone Encounter (Addendum)
Oncology Nurse Navigator Documentation  Received call from patient's wife.    Mr. [name] had follow-up with ENT Dr. Redmond Baseman today who recommended continued observation on alternating 64-month intervals between  RadOnc and himself starting with RadOnc appt in August, next follow-up with him in October.     Wife requested currently scheduled 9/12 appt with Dr. Tammi Klippel be changed to mid-August, requested CT Chest to be scheduled accordingly.   PA Freeman Caldron and Medical Secretary [place] informed.  Keith Orem, RN, BSN Head & Neck Oncology Nurse [place] [facility] [place] [phone]

## 2018-04-25 ENCOUNTER — Encounter: Payer: Self-pay | Admitting: Family Medicine

## 2018-04-25 DIAGNOSIS — D380 Neoplasm of uncertain behavior of larynx: Secondary | ICD-10-CM | POA: Insufficient documentation

## 2018-04-29 ENCOUNTER — Other Ambulatory Visit: Payer: Self-pay | Admitting: Family Medicine

## 2018-05-07 ENCOUNTER — Ambulatory Visit: Payer: PPO | Admitting: Neurology

## 2018-05-07 ENCOUNTER — Encounter

## 2018-05-07 ENCOUNTER — Encounter: Payer: Self-pay | Admitting: Neurology

## 2018-05-07 VITALS — BP 122/66 | HR 84 | Ht 71.0 in | Wt 170.0 lb

## 2018-05-07 DIAGNOSIS — R55 Syncope and collapse: Secondary | ICD-10-CM

## 2018-05-07 DIAGNOSIS — R42 Dizziness and giddiness: Secondary | ICD-10-CM

## 2018-05-07 NOTE — Patient Instructions (Signed)
We will get MRA of the head and get vestibular rehabilitation set up.

## 2018-05-07 NOTE — Progress Notes (Signed)
Reason for visit: Vertigo, syncope  [name] is an 67 y.o. male  History of present illness:  [name] is a 67 year old right-handed white male with a history of lung cancer, with ongoing tobacco abuse.  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  He continues to smoke a pack of cigarettes daily.  The patient has had a greater than five-year history of intermittent episodes of syncope, he had a recent event 2 weeks ago.  The one prior to that was 6 months ago.  The patient has no warning, he will suddenly black out briefly.  He does not jerk or twitch.  He also reports separate episodes of vertigo, these are usually associated with looking up, clearly positional.  He does not have vertigo while lying down in bed.  He has had a carotid Doppler study that did not show high-grade blockages in the carotid vessels, the vertebral artery flow was antegrade.  MRI of the brain done 5 years ago showed mild small vessel disease.  The patient had been placed on Topamax for headaches, but he has been off this for several months and the headaches have not returned.  He has been followed by Dr. Tamala Julian through cardiology, he had a 30-day cardiac monitor study that did not show cardiac rhythm abnormalities but he did not have a syncopal event while being monitored.  He returns to this office for an evaluation.  Past Medical History:  Diagnosis Date  . Allergy   . Anemia   . Anginal pain ([place])   . Arthritis    "hands"  . Asthma   . Back pain    4 deteriorating disc and receives an injection q3-17mon;has been doing this for about 41yrs  . Blood transfusion    as a child  . Bronchitis   . Cancer ([place]) dx'd 2013   Non-small cell lung cancer  . Chronic kidney disease    acute kidney failure post surgery  . COPD (chronic obstructive pulmonary disease) (HCC)    uses Albuterol and Spiriva daily  . Coronary artery disease    has 1 stent  .  Depression    takes Prozac daily  . Emphysema    sees Dr.Ramaswami for this  . Family history of adverse reaction to anesthesia    mother was hard to wake up sometimes  . GERD (gastroesophageal reflux disease)    takes Prilosec daily, EGD nml 06/2017  . Glaucoma    hx of  . H/O hiatal hernia   . Hypertension    takes  HYzaar daily  . Insomnia    takes Trazodone nightly  . Lung mass    right upper lobe  . Myocardial infarct ([place])   . 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 Obesity   . 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 Periodic limb movements of sleep   . Peripheral neuropathy   . Pneumonia    hx of' 67 yo,rd' last time in 2013  . Productive cough    white in color but no odor  . Shortness of breath    with exertion   . Sleep apnea    uses BiPaP; "no longer have sleep apnea since I have lost over 100 lbs"  . Syncope and collapse 05/26/2013  . Thyroid disease   . Type II diabetes mellitus (HCC)    takes Metformin bid and Novolog  and Lantus daily    Past Surgical History:  Procedure Laterality Date  . CARDIAC CATHETERIZATION  2006/2008/2009  . CARPAL TUNNEL RELEASE     bilateral  . COLONOSCOPY    . CORONARY ANGIOPLASTY WITH STENT PLACEMENT     1 stent  . ELBOW SURGERY     ulnar nerve; left  . ESOPHAGOGASTRODUODENOSCOPY    . EYE SURGERY     pt. denies  . FUDUCIAL PLACEMENT Left 08/24/2016   Procedure: PLACEMENT OF FUDUCIAL;  Surgeon: Melrose Nakayama, MD;  Location: [facility];  Service: Thoracic;  Laterality: Left;  . INGUINAL HERNIA REPAIR  1990's   double inguinal  . LOBECTOMY  03/17/2012   upper right side with resection  . MICROLARYNGOSCOPY WITH CO2 LASER AND EXCISION OF VOCAL CORD LESION N/A 06/28/2017   Procedure: MICROLARYNGOSCOPY WITH BIOPSY;  Surgeon: Melida Quitter, MD;  Location: [facility];  Service: ENT;  Laterality: N/A;  . MICROLARYNGOSCOPY WITH CO2 LASER AND EXCISION OF VOCAL CORD LESION N/A 07/19/2017   Procedure: MICROLARYNGOSCOPY  WITH CO2 LASER AND EXCISION OF VOCAL CORD LESION;  Surgeon: Melida Quitter, MD;  Location: [facility];  Service: ENT;  Laterality: N/A;  MICRO DIRECT LARYNGOSCOPY WITH CO2 LASER VOCAL CORD STRIPPING/POSS JET VENTURI VENTILATION  . REFRACTIVE SURGERY     bilaterally  . ROTATOR CUFF REPAIR     left  . VIDEO BRONCHOSCOPY WITH ENDOBRONCHIAL NAVIGATION N/A 08/24/2016   Procedure: VIDEO BRONCHOSCOPY WITH ENDOBRONCHIAL NAVIGATION;  Surgeon: Melrose Nakayama, MD;  Location: [facility] OR;  Service: Thoracic;  Laterality: N/A;    Family History  Problem Relation Age of Onset  . COPD Mother   . Diabetes Mother   . Hypertension Mother   . Heart attack Father   . Diabetes Father   . Anesthesia problems Neg Hx   . Hypotension Neg Hx   . Malignant hyperthermia Neg Hx   . Pseudochol deficiency Neg Hx     Social history:  reports that he has been smoking cigarettes.  He has a 81.00 pack-year smoking history. He has never used smokeless tobacco. He reports that he has current or past drug history. Drugs: Cocaine and Marijuana. He reports that he does not drink alcohol.    Allergies  Allergen Reactions  . Actos [Pioglitazone] Swelling    EDEMA REACTION UNSPECIFIED  . Avandia [Rosiglitazone] Swelling    SWELLING REACTION UNSPECIFIED   . Dilaudid [Hydromorphone Hcl] Other (See Comments)    Made him crazy    Medications:  Prior to Admission medications   Medication Sig Start Date End Date Taking? Authorizing Provider  acetaminophen (TYLENOL) 500 MG tablet Take 1,000 mg by mouth every 6 (six) hours as needed for moderate pain.   Yes [provider]  albuterol (PROVENTIL HFA;VENTOLIN HFA) 108 (90 Base) MCG/ACT inhaler Inhale 2 puffs into the lungs every 6 (six) hours as needed for wheezing or shortness of breath. 04/02/18  Yes Delsa Grana, PA-C  albuterol (PROVENTIL) (2.5 MG/3ML) 0.083% nebulizer solution Take 3 mLs (2.5 mg total) by nebulization every 4 (four) hours as needed for wheezing or  shortness of breath. 04/02/18  Yes Delsa Grana, PA-C  atorvastatin (LIPITOR) 40 MG tablet Take 40 mg by mouth daily.   Yes [provider]  diltiazem (CARDIZEM CD) 120 MG 24 hr capsule TAKE 1 CAPSULE (120 MG TOTAL) BY MOUTH DAILY. 12/16/17  Yes Turner, Eber Hong, MD  diphenhydramine-acetaminophen (TYLENOL PM) 25-500 MG TABS tablet Take 1 tablet by mouth at bedtime as needed (sleep).  Yes [provider]  Docosanol (ABREVA EX) Apply 1 application topically daily as needed (cold sores).   Yes [provider]  FLUoxetine (PROZAC) 40 MG capsule Take 1 capsule (40 mg total) by mouth daily. 01/22/18  Yes [name], MD  fluticasone (FLONASE) 50 MCG/ACT nasal spray Place 2 sprays into both nostrils as needed.  06/06/17  Yes [provider]  gabapentin (NEURONTIN) 300 MG capsule TAKE 1 CAPSULE BY MOUTH THREE TIMES DAILY 04/29/18  Yes [name], MD  ipratropium-albuterol (DUONEB) 0.5-2.5 (3) MG/3ML SOLN USE 1 VIAL IN NEBULIZER 4 TIMES A DAY Patient taking differently: Inhale 1 vial every 6 hours as needed for shortness of breath 04/11/17  Yes Brand Males, MD  LANTUS 100 UNIT/ML injection Inject 8 Units into the skin every morning.  08/26/15  Yes [provider]  losartan (COZAAR) 25 MG tablet Take 25 mg by mouth daily.   Yes [provider]  Multiple Vitamin (MULTIVITAMIN WITH MINERALS) TABS Take 1 tablet by mouth daily.   Yes [provider]  nitroGLYCERIN (NITROSTAT) 0.4 MG SL tablet Place 0.4 mg under the tongue every 5 (five) minutes as needed for chest pain.   Yes [provider]  ranitidine (ZANTAC) 150 MG capsule Take 1 capsule (150 mg total) by mouth 2 (two) times daily. Patient taking differently: Take 150 mg by mouth daily.  05/05/13  Yes [name], MD  sucralfate (CARAFATE) 1 g tablet Take 1 tablet (1 g total) 4 (four) times daily -  with meals and at bedtime by mouth. 5 min before meals for radiation  induced esophagitis 09/09/17  Yes Hayden Pedro, PA-C  traZODone (DESYREL) 100 MG tablet TAKE ONE TABLET BY MOUTH EVERY NIGHT AT BEDTIME 02/26/18  Yes [name], MD  topiramate (TOPAMAX) 50 MG tablet Take 100 mg by mouth 2 (two) times daily.    [provider]    ROS:  Out of a complete 14 system review of symptoms, the patient complains only of the following symptoms, and all other reviewed systems are negative.  Syncope Vertigo  Blood pressure 122/66, pulse 84, height 5\' 11"  (1.803 m), weight 170 lb (77.1 kg).   Blood pressure, standing is 106/68, heart rate 89.  Blood pressure lying down is 118/64, heart rate 83.  Physical Exam  General: The patient is alert and cooperative at the time of the examination.  The patient is thin.  Skin: No significant peripheral edema is noted.   Neurologic Exam  Mental status: The patient is alert and oriented x 3 at the time of the examination. The patient has apparent normal recent and remote memory, with an apparently normal attention span and concentration ability.   Cranial nerves: Facial symmetry is present. Speech is normal, no aphasia or dysarthria is noted. Extraocular movements are full. Visual fields are full.  Motor: The patient has good strength in all 4 extremities.  Sensory examination: Soft touch sensation is symmetric on the face, arms, and legs.  Coordination: The patient has good finger-nose-finger and heel-to-shin bilaterally.  Gait and station: The patient has a normal gait. Tandem gait is slightly unsteady. Romberg is negative. No drift is seen.  Reflexes: Deep tendon reflexes are symmetric.   Assessment/Plan:  1.  Recurrent syncope  2.  Vertigo, positional  The patient will be sent for vestibular rehabilitation.  He will have MRI angiogram of the head.  He will follow-up in 6 months.  The patient may need a loop  recorder to monitor him over several years to capture an episode of syncope.   The patient does have an occasionally irregular heart rhythm.  Jill Alexanders MD 05/07/2018 10:12 AM  [facility] [address] [place], [place] [address]  Phone [phone] Fax [phone]

## 2018-05-12 ENCOUNTER — Telehealth: Payer: Self-pay | Admitting: Neurology

## 2018-05-12 NOTE — Telephone Encounter (Signed)
Health team order sent to [facility]: [facility] per their website. They will reach out to the pt to schedule.

## 2018-05-14 ENCOUNTER — Encounter: Payer: Self-pay | Admitting: Podiatry

## 2018-05-14 ENCOUNTER — Ambulatory Visit: Payer: PPO | Admitting: Podiatry

## 2018-05-14 DIAGNOSIS — M2010 Hallux valgus (acquired), unspecified foot: Secondary | ICD-10-CM

## 2018-05-14 DIAGNOSIS — M79676 Pain in unspecified toe(s): Secondary | ICD-10-CM | POA: Diagnosis not present

## 2018-05-14 DIAGNOSIS — E1151 Type 2 diabetes mellitus with diabetic peripheral angiopathy without gangrene: Secondary | ICD-10-CM | POA: Diagnosis not present

## 2018-05-14 DIAGNOSIS — B351 Tinea unguium: Secondary | ICD-10-CM | POA: Diagnosis not present

## 2018-05-14 NOTE — Progress Notes (Signed)
Complaint:  Visit Type: Patient returns to my office for continued preventative foot care services. Complaint: Patient states" my nails have grown long and thick and become painful to walk and wear shoes" Patient has been diagnosed with DM with no foot complications. The patient presents for preventative foot care services. No changes to ROS.  Patient has history of throat cancer.  Podiatric Exam: Vascular: dorsalis pedis and posterior tibial pulses are diminished but  palpable bilateral. Capillary return is immediate. Temperature gradient is WNL. Skin turgor WNL  Sensorium: Normal Semmes Weinstein monofilament test. Normal tactile sensation bilaterally. Nail Exam: Pt has thick disfigured discolored nails with subungual debris noted bilateral entire nail hallux through fifth toenails Ulcer Exam: There is no evidence of ulcer or pre-ulcerative changes or infection. Orthopedic Exam: Muscle tone and strength are WNL. No limitations in general ROM. No crepitus or effusions noted. Foot type and digits show no abnormalities. HAV  B/L.  Disfigured great mtoes n B/L. Skin: No Porokeratosis. No infection or ulcers  Diagnosis:  Onychomycosis, , Pain in right toe, pain in left toes  Treatment & Plan Procedures and Treatment: Consent by patient was obtained for treatment procedures.   Debridement of mycotic and hypertrophic toenails, 1 through 5 bilateral and clearing of subungual debris. No ulceration, no infection noted.  Return Visit-Office Procedure: Patient instructed to return to the office for a follow up visit 3 months for continued evaluation and treatment.    Gardiner Barefoot DPM

## 2018-05-20 DIAGNOSIS — N183 Chronic kidney disease, stage 3 (moderate): Secondary | ICD-10-CM | POA: Diagnosis not present

## 2018-05-20 DIAGNOSIS — Z794 Long term (current) use of insulin: Secondary | ICD-10-CM | POA: Diagnosis not present

## 2018-05-20 DIAGNOSIS — E1142 Type 2 diabetes mellitus with diabetic polyneuropathy: Secondary | ICD-10-CM | POA: Diagnosis not present

## 2018-06-13 ENCOUNTER — Telehealth: Payer: Self-pay | Admitting: *Deleted

## 2018-06-13 NOTE — Telephone Encounter (Signed)
CALLED PATIENT TO INFORM OF LAB AND CT ON 06-24-18 - LAB - 11:15 AM @ [facility] AND HIS CT ON 06-24-18- ARRIVAL TIME - 12:15 PM @ [facility], PT. TO BE NPO- 4 HRS. PRIOR TO TEST, FU ON 06-25-18 , PT. TO GET RESULTS FROM ASHLYN BRUNING, LVM FOR A RETURN CALL

## 2018-06-17 ENCOUNTER — Other Ambulatory Visit: Payer: Self-pay | Admitting: Family Medicine

## 2018-06-20 NOTE — Progress Notes (Signed)
[name] 67 y.o. 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   Using a feeding tube?:  N/A  Weight changes, if any:  Wt Readings from Last 3 Encounters:  06/25/18 176 lb 6.4 oz (80 kg)  05/07/18 170 lb (77.1 kg)  04/02/18 159 lb (72.1 kg)    Swallowing issues, if YOF:[id number] swallowing issue while eating and drinking. Smoking or chewing tobacco? Patient states that he smokes. Smokes one packs per day.  Using fluoride trays daily? N/A  Last ENT visit was on: Dr. Redmond Baseman June 2019  Other notable issues, if any: None BP (!) 115/54 (BP Location: Right Arm, Patient Position: Sitting)   Pulse 77   Temp 97.6 F (36.4 C) (Oral)   Resp 18   Ht 5\' 11"  (1.803 m)   Wt 176 lb 6.4 oz (80 kg)   SpO2 97%   BMI 24.60 kg/m

## 2018-06-24 ENCOUNTER — Ambulatory Visit (HOSPITAL_COMMUNITY)
Admission: RE | Admit: 2018-06-24 | Discharge: 2018-06-24 | Disposition: A | Discharge status: Home / Self Care | Payer: PPO | Source: Ambulatory Visit | Attending: Urology | Admitting: Urology

## 2018-06-24 ENCOUNTER — Encounter (HOSPITAL_COMMUNITY): Payer: Self-pay

## 2018-06-24 ENCOUNTER — Ambulatory Visit
Admission: RE | Admit: 2018-06-24 | Discharge: 2018-06-24 | Disposition: A | Discharge status: Home / Self Care | Payer: PPO | Source: Ambulatory Visit | Attending: Urology | Admitting: Urology

## 2018-06-24 DIAGNOSIS — I2510 Atherosclerotic heart disease of native coronary artery without angina pectoris: Secondary | ICD-10-CM | POA: Insufficient documentation

## 2018-06-24 DIAGNOSIS — J438 Other emphysema: Secondary | ICD-10-CM | POA: Diagnosis not present

## 2018-06-24 DIAGNOSIS — C3432 Malignant neoplasm of lower lobe, left bronchus or lung: Secondary | ICD-10-CM | POA: Diagnosis not present

## 2018-06-24 DIAGNOSIS — C320 Malignant neoplasm of glottis: Secondary | ICD-10-CM | POA: Diagnosis not present

## 2018-06-24 DIAGNOSIS — Z902 Acquired absence of lung [part of]: Secondary | ICD-10-CM | POA: Diagnosis not present

## 2018-06-24 DIAGNOSIS — Z85118 Personal history of other malignant neoplasm of bronchus and lung: Secondary | ICD-10-CM | POA: Diagnosis not present

## 2018-06-24 DIAGNOSIS — Z923 Personal history of irradiation: Secondary | ICD-10-CM | POA: Insufficient documentation

## 2018-06-24 DIAGNOSIS — F17210 Nicotine dependence, cigarettes, uncomplicated: Secondary | ICD-10-CM | POA: Insufficient documentation

## 2018-06-24 DIAGNOSIS — R918 Other nonspecific abnormal finding of lung field: Secondary | ICD-10-CM | POA: Diagnosis not present

## 2018-06-24 DIAGNOSIS — I700 Atherosclerosis of aorta: Secondary | ICD-10-CM | POA: Insufficient documentation

## 2018-06-24 LAB — BASIC METABOLIC PANEL - CANCER CENTER ONLY
ANION GAP: 7 (ref 5–15)
BUN: 25 mg/dL — ABNORMAL HIGH (ref 8–23)
CALCIUM: 9.1 mg/dL (ref 8.9–10.3)
CO2: 28 mmol/L (ref 22–32)
CREATININE: 1.18 mg/dL (ref 0.61–1.24)
Chloride: 104 mmol/L (ref 98–111)
GFR, Est AFR Am: >60 mL/min (ref >60)
Glucose, Bld: 98 mg/dL (ref 70–99)
Potassium: 4.9 mmol/L (ref 3.5–5.1)
Sodium: 139 mmol/L (ref 135–145)

## 2018-06-24 MED ORDER — IOHEXOL 300 MG/ML  SOLN
75.0000 mL | Freq: Once | INTRAMUSCULAR | Status: AC | PRN
  Administered 2018-06-24: 75 mL via INTRAVENOUS

## 2018-06-25 ENCOUNTER — Encounter: Payer: Self-pay | Admitting: Radiation Oncology

## 2018-06-25 ENCOUNTER — Other Ambulatory Visit: Payer: Self-pay

## 2018-06-25 ENCOUNTER — Ambulatory Visit
Admission: RE | Admit: 2018-06-25 | Discharge: 2018-06-25 | Disposition: A | Discharge status: Home / Self Care | Payer: PPO | Source: Ambulatory Visit | Attending: Urology | Admitting: Urology

## 2018-06-25 VITALS — BP 115/54 | HR 77 | Temp 97.6°F | Resp 18 | Ht 71.0 in | Wt 176.4 lb

## 2018-06-25 DIAGNOSIS — C320 Malignant neoplasm of glottis: Secondary | ICD-10-CM

## 2018-06-25 DIAGNOSIS — C3432 Malignant neoplasm of lower lobe, left bronchus or lung: Secondary | ICD-10-CM | POA: Diagnosis not present

## 2018-06-25 DIAGNOSIS — C3411 Malignant neoplasm of upper lobe, right bronchus or lung: Secondary | ICD-10-CM

## 2018-06-25 DIAGNOSIS — Z85118 Personal history of other malignant neoplasm of bronchus and lung: Secondary | ICD-10-CM | POA: Diagnosis not present

## 2018-06-25 DIAGNOSIS — Z8521 Personal history of malignant neoplasm of larynx: Secondary | ICD-10-CM | POA: Diagnosis not present

## 2018-06-25 DIAGNOSIS — Z08 Encounter for follow-up examination after completed treatment for malignant neoplasm: Secondary | ICD-10-CM | POA: Diagnosis not present

## 2018-06-25 MED ORDER — LARYNGOSCOPY SOLUTION RAD-ONC
15.0000 mL | Freq: Once | TOPICAL | Status: AC
  Administered 2018-06-25: 15 mL via TOPICAL
  Filled 2018-06-25: qty 15

## 2018-06-25 NOTE — Progress Notes (Signed)
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  Interval Since Last Radiation:  8.5 months  08/26/2017 - 10/04/2017:  The larynx was treated to 63 Gy in 28 fractions of 2.25 Gy.  09/17/2016 - 09/24/2016 SBRT Treatment:  The LLL target was treated to 54Gy in 74fractions of 18Gy  Narrative:  In summary, this is a pleasant gentleman with a history of Stage IA NSCLC, squamous cell of the left lower lobe of the lung who completed SBRT to the target in November 2017. He also has a history of Stage I, NSCLC, squamous cell of the right upper lobe, which was resected in 2013 with Dr. [name]. He was followed in surveillance which is when his most recent cancer was identified. He has been followed in surveillance since SBRT and has had several mediastinal nodes that have been stable at 9-10 mm. His follow up Chest CT on 05/06/17 revealed no new concerning lung lesions, and post radiotherapy change in the LLL. A PET scan on 05/31/17 showed low-grade activity in a mildly enlarged right lower paratracheal lymph node with max SUV of 4.1 (formerly 4.6). There was also low-grade activity in indistinct left hilar lymph node, max SUV of 3.0. 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  Additionally, there was diffuse accentuated activity in the proximal 75% of the stomach associated with mild wall thickening but also nondistention.  This was further evaluated with  endoscopy by Dr. Watt Climes and no concerning findings were noted. He underwent repeat microlaryngoscopy with CO2 laser stripping of the right true and false vocal cords on 07/19/17 with Dr. Redmond Baseman. Biopsy of the right false vocal cord showed mild squamous dysplasia. Biopsy of the right true vocal cord showed invasive squamous cell carcinoma.  This was treated with radiotherapy which was completed between 08/26/2017 - 10/04/2017. He tolerated radiation treatment relatively well.   On follow up PET scan from 01/07/2018, there were no typical findings to suggest metastatic head/neck cancer, disease stability in the chest with similar low-level hypermetabolism within a borderline enlarged right paratracheal node and presumed evolving radiation change in the superior segment of the left lower lobe.  No new lung lesions were noted.  Recent TSH was normal at 1.727 on 01/07/18.  Interval History: He returns today for routine follow-up nasopharyngoscopy and to discuss his recent CT Chest scan from 06/24/18. This revealed: a few scattered subcentimeter pulmonary nodules in the lungs bilaterally, the largest of which are new in the left upper lobe measuring up to 7 mm. These are nonspecific but warrant close attention on follow-up studies to exclude the possibility of metastatic disease.  There are stable changes of postradiation mass-like fibrosis in the superior segment of the left lower lobe and iffuse bronchial wall thickening with mild centrilobular and mild-to-moderate paraseptal emphysema; suggestive of underlying COPD.                           He continues to alternate his  follow up care with ENT and was most recently seen by Dr. Redmond Baseman in June 2019. He underwent an office laryngoscopy at that time which was normal by written report from Dr. Iona Beard office.  On review of systems, he denies any pain or difficulty associated with swallowing but continues with hoarse voice which seems to have stabilized. He reports a chronic  cough with thick sticky white sputum, which he reports occurs in the morning and has been improving. He denies increased shortness of breath, chest pain, fever, chills or night sweats. He reports fatigue due to difficulty sleeping.  He reports a healthy appetite and is maintaining his weight.  Overall, he is pleased with his progress to date.  ALLERGIES:  is allergic to actos [pioglitazone]; avandia [rosiglitazone]; and dilaudid [hydromorphone hcl].  Meds: Current Outpatient Medications  Medication Sig Dispense Refill  . acetaminophen (TYLENOL) 500 MG tablet Take 1,000 mg by mouth every 6 (six) hours as needed for moderate pain.    Marland Kitchen albuterol (PROVENTIL HFA;VENTOLIN HFA) 108 (90 Base) MCG/ACT inhaler Inhale 2 puffs into the lungs every 6 (six) hours as needed for wheezing or shortness of breath. 1 Inhaler 0  . albuterol (PROVENTIL) (2.5 MG/3ML) 0.083% nebulizer solution Take 3 mLs (2.5 mg total) by nebulization every 4 (four) hours as needed for wheezing or shortness of breath. 150 mL 1  . atorvastatin (LIPITOR) 40 MG tablet Take 40 mg by mouth daily.    Marland Kitchen diltiazem (CARDIZEM CD) 120 MG 24 hr capsule TAKE 1 CAPSULE (120 MG TOTAL) BY MOUTH DAILY. 90 capsule 3  . diphenhydramine-acetaminophen (TYLENOL PM) 25-500 MG TABS tablet Take 1 tablet by mouth at bedtime as needed (sleep).     Marland Kitchen FLUoxetine (PROZAC) 40 MG capsule Take 1 capsule (40 mg total) by mouth daily. 90 capsule 3  . fluticasone (FLONASE) 50 MCG/ACT nasal spray Place 2 sprays into both nostrils as needed.   5  . gabapentin (NEURONTIN) 300 MG capsule TAKE 1 CAPSULE BY MOUTH THREE TIMES DAILY 270 capsule 0  . ipratropium-albuterol (DUONEB) 0.5-2.5 (3) MG/3ML SOLN USE 1 VIAL IN NEBULIZER 4 TIMES A DAY (Patient taking differently: Inhale 1 vial every 6 hours as needed for shortness of breath) 360 mL 5  . LANTUS 100 UNIT/ML injection Inject 4 Units into the skin every morning.   3  . losartan (COZAAR) 25 MG tablet Take 25 mg by mouth daily.      . Multiple Vitamin (MULTIVITAMIN WITH MINERALS) TABS Take 1 tablet by mouth daily.    . ranitidine (ZANTAC) 150 MG capsule Take 1 capsule (150 mg total) by mouth 2 (two) times daily. (Patient taking differently: Take 150 mg by mouth daily. ) 60 capsule 11  . sucralfate (CARAFATE) 1 g tablet Take 1 tablet (1 g total) 4 (four) times daily -  with meals and at bedtime by mouth. 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 traZODone (DESYREL) 100 MG tablet TAKE ONE TABLET BY MOUTH EVERY NIGHT AT BEDTIME 90 tablet 0  . Docosanol (ABREVA EX) Apply 1 application topically daily as needed (cold sores).    . nitroGLYCERIN (NITROSTAT) 0.4 MG SL tablet Place 0.4 mg under the tongue every 5 (five) minutes as needed for chest pain.     Current Facility-Administered Medications  Medication Dose Route Frequency Provider Last Rate Last Dose  . albuterol (PROVENTIL) (2.5 MG/3ML) 0.083% nebulizer solution 2.5 mg  2.5 mg Nebulization Once Delsa Grana, PA-C  Physical Findings:  height is 5\' 11"  (1.803 m) and weight is 176 lb 6.4 oz (80 kg). His oral temperature is 97.6 F (36.4 C). His blood pressure is 115/54 (abnormal) and his pulse is 77. His respiration is 18 and oxygen saturation is 97%.  Pain Assessment Pain Score: 2  Pain Frequency: Constant Pain Loc: Throat/10  In general this is a well appearing Caucasian male in no acute distress. He's alert and oriented x4 and appropriate throughout the examination. HEENT reveals that the patient is normocephalic, atraumatic. EOMs are intact. PERRLA. Skin is intact without any evidence of gross lesions. Cardiovascular exam reveals a regular rate and rhythm, no clicks rubs or murmurs are auscultated. Chest is clear to auscultation bilaterally. Lymphatic assessment is performed and does not reveal any adenopathy in the cervical, supraclavicular, axillary, or inguinal chains. Abdomen has active bowel sounds in all quadrants and is intact. The  abdomen is soft, non tender, non distended. Lower extremities are negative for pretibial pitting edema, deep calf tenderness, cyanosis or clubbing. There is mild hyperpigmentation to the treatment field and a little palpable fibrosis midline, but no palpable lymphadenopathy in the cervical or supraclavicular chains.   PROCEDURE NOTE: After obtaining consent and anesthetizing the nasal cavity with topical lidocaine and phenylephrine, the flexible endoscope was introduced and passed through right nostril. The nasopharynx, posterior oropharynx and larynx appear normal. There is mild edema of the glottis and a bulge on the right tonsil.  Vocal cords open and close symmetrically on phonation and there are no obvious lesions or ulcerations noted.  Piriform sinuses appear patent with minimal secretions noted.    Lab Findings: Lab Results  Component Value Date   WBC 16.9 (H) 04/01/2018   HGB 11.4 (L) 04/01/2018   HCT 34.2 (L) 04/01/2018   MCV 93.2 04/01/2018   PLT 318 04/01/2018     Radiographic Findings: Ct Chest W Contrast  Result Date: 06/24/2018 CLINICAL DATA:  67 year old male with history of non-small cell lung cancer diagnosed in 2013 status post right upper lobectomy and radiation therapy now complete. Follow-up study. EXAM: CT CHEST WITH CONTRAST TECHNIQUE: Multidetector CT imaging of the chest was performed during intravenous contrast administration. CONTRAST:  85mL OMNIPAQUE IOHEXOL 300 MG/ML  SOLN COMPARISON:  Chest CT 01/07/2018. FINDINGS: Cardiovascular: Heart size is normal. There is no significant pericardial fluid, thickening or pericardial calcification. There is aortic atherosclerosis, as well as atherosclerosis of the great vessels of the mediastinum and the coronary arteries, including calcified atherosclerotic plaque in the left main, left anterior descending, left circumflex and right coronary arteries. Severe thickening calcification of the aortic valve. Severe calcifications of  the mitral annulus. Mediastinum/Nodes: No pathologically enlarged mediastinal or hilar lymph nodes. Esophagus is unremarkable in appearance. No axillary lymphadenopathy. Lungs/Pleura: Status post right upper lobectomy. Compensatory hyperexpansion of the right middle and lower lobes. Background of diffuse bronchial wall thickening with mild centrilobular and mild-to-moderate paraseptal emphysema. In the left lower lobe there are fiducial markers, adjacent to areas of mass-like architectural distortion which are stable compared to prior examinations, most compatible with areas of postradiation mass-like fibrosis. Patchy areas of ill-defined ground-glass attenuation are noted throughout the lung bases bilaterally. Several small pulmonary nodules are noted throughout the lungs bilaterally, some of which are stable compared to prior examinations. The largest of these pulmonary nodules is new in the left upper lobe (axial image 29 of series 7) measuring 7 mm. Upper Abdomen: Aortic atherosclerosis. Multiple simple cysts in the visualized portions of the kidneys bilaterally,  largest of which measures 3 cm in the upper pole of the right kidney. Musculoskeletal: There are no aggressive appearing lytic or blastic lesions noted in the visualized portions of the skeleton. IMPRESSION: 1. A few scattered subcentimeter pulmonary nodules are noted in the lungs bilaterally, largest of which are new in the left upper lobe measuring up to 7 mm. These are nonspecific but warrant close attention on follow-up studies to exclude the possibility of metastatic disease. 2. Stable changes of postradiation mass-like fibrosis in superior segment of the left lower lobe. 3. Diffuse bronchial wall thickening with mild centrilobular and mild-to-moderate paraseptal emphysema; imaging findings suggestive of underlying COPD. 4. Aortic atherosclerosis, in addition to left main and 3 vessel coronary artery disease. Please note that although the presence  of coronary artery calcium documents the presence of coronary artery disease, the severity of this disease and any potential stenosis cannot be assessed on this non-gated CT examination. Assessment for potential risk factor modification, dietary therapy or pharmacologic therapy may be warranted, if clinically indicated. 5. There are calcifications of the aortic valve and mitral annulus. Echocardiographic correlation for evaluation of potential valvular dysfunction may be warranted if clinically indicated. 6. Additional incidental findings, similar to the prior examination, as above. Aortic Atherosclerosis (ICD10-I70.0) and Emphysema (ICD10-J43.9). Electronically Signed   By: Vinnie Langton M.D.   On: 06/24/2018 16:19    Impression/Plan: 1. 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 Recent imaging and laryngoscopy show no evidence of residual or metastatic disease in the head or neck and stable disease in the chest. We will plan to repeat a TSH annually in March and plan to alternate follow-up visits with Dr. Redmond Baseman every 2 months for the first 1-2 years and then every 6 months for year 2-3 and every 8 months for years 3-5 with annual follow up thereafter. 2. Stage IA, T1aN0squamous cell carcinoma of the right upper lobe and left lower lobe of lung. We will continue to monitor his disease with serial CT Chest scans every 6 months with follow up visits to discuss findings.  He knows to call with any questions or concerns in the interim.    Nicholos Johns, PA-C    Tyler Pita, MD  [facility] Direct Dial: [phone]  Fax: [phone] [email or web address]  Skype  [email or web address]  This document serves as a record of services personally performed by Tyler Pita, MD and Freeman Caldron, PA-C. It was created on their behalf by Wilburn Mylar, a trained  medical scribe. The creation of this record is based on the scribe's personal observations and the provider's statements to them. This document has been checked and approved by the attending provider.

## 2018-06-30 LAB — HM DIABETES EYE EXAM

## 2018-07-03 ENCOUNTER — Encounter: Payer: Self-pay | Admitting: *Deleted

## 2018-07-17 ENCOUNTER — Ambulatory Visit: Payer: Self-pay | Admitting: Radiation Oncology

## 2018-08-05 ENCOUNTER — Encounter

## 2018-08-05 ENCOUNTER — Other Ambulatory Visit: Payer: Self-pay | Admitting: Family Medicine

## 2018-08-12 ENCOUNTER — Telehealth: Payer: Self-pay | Admitting: *Deleted

## 2018-08-12 NOTE — Telephone Encounter (Signed)
Oncology Nurse Navigator Documentation  Received return call from ENT Dr. Redmond Baseman' [place] indicating Mr. Windt has f/u appt scheduled 10/17.  PA  Ashlyn Bruning notified.    Gayleen Orem, RN, BSN Head & Neck Oncology Nurse [facility] [place] [phone]

## 2018-08-13 ENCOUNTER — Ambulatory Visit: Payer: PPO | Admitting: Podiatry

## 2018-08-13 NOTE — Progress Notes (Signed)
Cardiology Office Note:    Date:  08/14/2018   ID:  MOHAB ASHBY, DOB 27-Mar-1951, MRN [id number]  PCP:  [name], MD  Cardiologist:  No primary care provider on file.   Referring MD: [name], MD   Chief Complaint  Patient presents with  . Coronary Artery Disease    History of Present Illness: 31   [name] is a 67 y.o. male with a hx of CAD with RCA DES (remote), lung cancer status post partial right lung resection, hypertension, tobacco abuse, COPD, hyperlipidemia, and essential hypertension   He has no cardiac complaints.  He does have dyspnea on exertion.  Most likely related to COPD.  He is still smoking.  Still has active head and neck cancer.  No angina.  Does have some dysphagia and choking when he eats.  Past Medical History:  Diagnosis Date  . Allergy   . Anemia   . Anginal pain ([place])   . Arthritis    "hands"  . Asthma   . Back pain    4 deteriorating disc and receives an injection q3-93mon;has been doing this for about 58yrs  . Blood transfusion    as a child  . Bronchitis   . Cancer ([place]) dx'd 2013   Non-small cell lung cancer  . Chronic kidney disease    acute kidney failure post surgery  . COPD (chronic obstructive pulmonary disease) (HCC)    uses Albuterol and Spiriva daily  . Coronary artery disease    has 1 stent  . Depression    takes Prozac daily  . Emphysema    sees Dr.Ramaswami for this  . Family history of adverse reaction to anesthesia    mother was hard to wake up sometimes  . GERD (gastroesophageal reflux disease)    takes Prilosec daily, EGD nml 06/2017  . Glaucoma    hx of  . H/O hiatal hernia   . Hypertension    takes  HYzaar daily  . Insomnia    takes Trazodone nightly  . Lung mass    right upper lobe  . Myocardial infarct ([place])   . 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 Obesity   . 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 Periodic limb movements of sleep   . Peripheral  neuropathy   . Pneumonia    hx of' 67 yo,rd' last time in 2013  . Productive cough    white in color but no odor  . Shortness of breath    with exertion   . Sleep apnea    uses BiPaP; "no longer have sleep apnea since I have lost over 100 lbs"  . Syncope and collapse 05/26/2013  . Thyroid disease   . Type II diabetes mellitus (HCC)    takes Metformin bid and Novolog and Lantus daily    Past Surgical History:  Procedure Laterality Date  . CARDIAC CATHETERIZATION  2006/2008/2009  . CARPAL TUNNEL RELEASE     bilateral  . COLONOSCOPY    . CORONARY ANGIOPLASTY WITH STENT PLACEMENT     1 stent  . ELBOW SURGERY     ulnar nerve; left  . ESOPHAGOGASTRODUODENOSCOPY    . EYE SURGERY     pt. denies  . FUDUCIAL PLACEMENT Left 08/24/2016   Procedure: PLACEMENT OF FUDUCIAL;  Surgeon: Melrose Nakayama, MD;  Location: [facility];  Service: Thoracic;  Laterality: Left;  . INGUINAL HERNIA REPAIR  1990's  double inguinal  . LOBECTOMY  03/17/2012   upper right side with resection  . MICROLARYNGOSCOPY WITH CO2 LASER AND EXCISION OF VOCAL CORD LESION N/A 06/28/2017   Procedure: MICROLARYNGOSCOPY WITH BIOPSY;  Surgeon: Melida Quitter, MD;  Location: [facility];  Service: ENT;  Laterality: N/A;  . MICROLARYNGOSCOPY WITH CO2 LASER AND EXCISION OF VOCAL CORD LESION N/A 07/19/2017   Procedure: MICROLARYNGOSCOPY WITH CO2 LASER AND EXCISION OF VOCAL CORD LESION;  Surgeon: Melida Quitter, MD;  Location: [facility];  Service: ENT;  Laterality: N/A;  MICRO DIRECT LARYNGOSCOPY WITH CO2 LASER VOCAL CORD STRIPPING/POSS JET VENTURI VENTILATION  . REFRACTIVE SURGERY     bilaterally  . ROTATOR CUFF REPAIR     left  . VIDEO BRONCHOSCOPY WITH ENDOBRONCHIAL NAVIGATION N/A 08/24/2016   Procedure: VIDEO BRONCHOSCOPY WITH ENDOBRONCHIAL NAVIGATION;  Surgeon: Melrose Nakayama, MD;  Location: [facility];  Service: Thoracic;  Laterality: N/A;    Current Medications: Current Meds  Medication Sig  . acetaminophen (TYLENOL) 500 MG  tablet Take 1,000 mg by mouth every 6 (six) hours as needed for moderate pain.  Marland Kitchen albuterol (PROVENTIL HFA;VENTOLIN HFA) 108 (90 Base) MCG/ACT inhaler Inhale 2 puffs into the lungs every 6 (six) hours as needed for wheezing or shortness of breath.  Marland Kitchen albuterol (PROVENTIL) (2.5 MG/3ML) 0.083% nebulizer solution Take 3 mLs (2.5 mg total) by nebulization every 4 (four) hours as needed for wheezing or shortness of breath.  Marland Kitchen atorvastatin (LIPITOR) 40 MG tablet Take 40 mg by mouth daily.  Marland Kitchen diltiazem (CARDIZEM CD) 120 MG 24 hr capsule Take 1 capsule (120 mg total) by mouth daily.  . diphenhydramine-acetaminophen (TYLENOL PM) 25-500 MG TABS tablet Take 1 tablet by mouth at bedtime as needed (sleep).   . Docosanol (ABREVA EX) Apply 1 application topically daily as needed (cold sores).  Marland Kitchen FLUoxetine (PROZAC) 40 MG capsule Take 1 capsule (40 mg total) by mouth daily.  . fluticasone (FLONASE) 50 MCG/ACT nasal spray Place 2 sprays into both nostrils as needed.   . gabapentin (NEURONTIN) 300 MG capsule TAKE 1 CAPSULE BY MOUTH THREE TIMES DAILY  . ipratropium-albuterol (DUONEB) 0.5-2.5 (3) MG/3ML SOLN USE 1 VIAL IN NEBULIZER 4 TIMES A DAY (Patient taking differently: Inhale 1 vial every 6 hours as needed for shortness of breath)  . LANTUS 100 UNIT/ML injection Inject 4 Units into the skin every morning.   Marland Kitchen losartan (COZAAR) 25 MG tablet Take 25 mg by mouth daily.  . Multiple Vitamin (MULTIVITAMIN WITH MINERALS) TABS Take 1 tablet by mouth daily.  . nitroGLYCERIN (NITROSTAT) 0.4 MG SL tablet Place 0.4 mg under the tongue every 5 (five) minutes as needed for chest pain.  . ranitidine (ZANTAC) 150 MG capsule Take 1 capsule (150 mg total) by mouth 2 (two) times daily. (Patient taking differently: Take 150 mg by mouth daily. )  . traZODone (DESYREL) 100 MG tablet TAKE ONE TABLET BY MOUTH EVERY NIGHT AT BEDTIME  . [DISCONTINUED] diltiazem (CARDIZEM CD) 120 MG 24 hr capsule TAKE 1 CAPSULE (120 MG TOTAL) BY MOUTH  DAILY.   Current Facility-Administered Medications for the 08/14/18 encounter (Office Visit) with Belva Crome, MD  Medication  . albuterol (PROVENTIL) (2.5 MG/3ML) 0.083% nebulizer solution 2.5 mg     Allergies:   Actos [pioglitazone]; Avandia [rosiglitazone]; and Dilaudid [hydromorphone hcl]   Social History   Socioeconomic History  . Marital status: Married    Spouse name: mary lou  . Number of children: 3  . Years of education: trade   .  Highest education level: Not on file  Occupational History  . 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 Financial resource strain: Not on file  . Food insecurity:    Worry: Not on file    Inability: Not on file  . Transportation needs:    Medical: Not on file    Non-medical: Not on file  Tobacco Use  . Smoking status: Current Every Day Smoker    Packs/day: 1.50    Years: 54.00    Pack years: 81.00    Types: Cigarettes  . Smokeless tobacco: Never Used  . Tobacco comment: 1ppd as of 11/26/17  ep  Substance and Sexual Activity  . Alcohol use: No    Comment: no alcolol since 2013  . Drug use: Yes    Types: Cocaine, Marijuana    Comment: quit 1990's  . Sexual activity: Not Currently  Lifestyle  . Physical activity:    Days per week: Not on file    Minutes per session: Not on file  . Stress: Not on file  Relationships  . Social connections:    Talks on phone: Not on file    Gets together: Not on file    Attends religious service: Not on file    Active member of club or organization: Not on file    Attends meetings of clubs or organizations: Not on file    Relationship status: Not on file  Other Topics Concern  . 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     Family History: The patient's  family history includes COPD in his mother; Diabetes in his father and mother; Heart attack in his father; Hypertension in his mother. There is no history of Anesthesia problems, Hypotension, Malignant hyperthermia, or Pseudochol deficiency.  ROS:   Please see the history of present illness.    He complains of aspiration, choking, fatigue, wheezing, cough, dyspnea on exertion, depression, back pain, dizziness, passing out, easy bruising, and difficulty with balance.  All other systems reviewed and are negative.  EKGs/Labs/Other Studies Reviewed:    The following studies were reviewed today: None  EKG:  EKG is not ordered today.  The ekg Apr 01, 2018 demonstrated normal sinus rhythm, no ischemic changes, and prominent voltage.  This EKG was from an emergency room visit.   Recent Labs: 01/07/2018: TSH 1.727 04/01/2018: ALT 60; Hemoglobin 11.4; Platelets 318 06/24/2018: BUN 25; Creatinine 1.18; Potassium 4.9; Sodium 139  Recent Lipid Panel    Component Value Date/Time   CHOL 137 05/10/2017 0850   TRIG 99 05/10/2017 0850   HDL 35 (L) 05/10/2017 0850   CHOLHDL 3.9 05/10/2017 0850   VLDL 20 05/10/2017 0850   LDLCALC 82 05/10/2017 0850    Physical Exam:    VS:  BP 122/68   Pulse 82   Ht 5\' 11"  (1.803 m)   Wt 175 lb 12.8 oz (79.7 kg)   BMI 24.52 kg/m     Wt Readings from Last 3 Encounters:  08/14/18 175 lb 12.8 oz (79.7 kg)  06/25/18 176 lb 6.4 oz (80 kg)  05/07/18 170 lb (77.1 kg)     GEN: Older than stated age in appearance.  Well developed in no acute distress HEENT: Normal NECK: No JVD. LYMPHATICS: No lymphadenopathy CARDIAC: RRR, no murmur, no gallop, no edema. VASCULAR: 2+ bilateral radial pulses.  No bruits. RESPIRATORY:  Clear to auscultation without rales, wheezing or rhonchi  ABDOMEN: Soft, non-tender, non-distended, No pulsatile mass, MUSCULOSKELETAL: No deformity  SKIN: Warm and dry NEUROLOGIC:  Alert and oriented x 3 PSYCHIATRIC:  Normal affect   ASSESSMENT:      1. Neurocardiogenic syncope   2. Coronary artery disease involving native coronary artery of native heart without angina pectoris   3. Essential hypertension   4. OSA on CPAP   5. 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 No recurrent syncope 2. No anginal complaints.  Last EKG was normal. 3. Blood pressure is at or below target of 130/80 mmHg. 4. Encouraged CPAP compliance.   Clinical follow-up in 1 year or earlier if cardiac complaints/angina.  If he requires ENT surgery he will likely be cleared because he exhibits no evidence of heart failure, angina, or significant arrhythmia.   Medication Adjustments/Labs and Tests Ordered: Current medicines are reviewed at length with the patient today.  Concerns regarding medicines are outlined above.  No orders of the defined types were placed in this encounter.  Meds ordered this encounter  Medications  . diltiazem (CARDIZEM CD) 120 MG 24 hr capsule    Sig: Take 1 capsule (120 mg total) by mouth daily.    Dispense:  90 capsule    Refill:  3    There are no Patient Instructions on file for this visit.   Signed, Sinclair Grooms, MD  08/14/2018 10:04 AM    [place]

## 2018-08-14 ENCOUNTER — Ambulatory Visit: Payer: PPO | Admitting: Interventional Cardiology

## 2018-08-14 ENCOUNTER — Encounter: Payer: Self-pay | Admitting: Interventional Cardiology

## 2018-08-14 VITALS — BP 122/68 | HR 82 | Ht 71.0 in | Wt 175.8 lb

## 2018-08-14 DIAGNOSIS — G4733 Obstructive sleep apnea (adult) (pediatric): Secondary | ICD-10-CM | POA: Diagnosis not present

## 2018-08-14 DIAGNOSIS — Z9989 Dependence on other enabling machines and devices: Secondary | ICD-10-CM | POA: Diagnosis not present

## 2018-08-14 DIAGNOSIS — R55 Syncope and collapse: Secondary | ICD-10-CM

## 2018-08-14 DIAGNOSIS — I2510 Atherosclerotic heart disease of native coronary artery without angina pectoris: Secondary | ICD-10-CM

## 2018-08-14 DIAGNOSIS — C320 Malignant neoplasm of glottis: Secondary | ICD-10-CM | POA: Diagnosis not present

## 2018-08-14 DIAGNOSIS — I10 Essential (primary) hypertension: Secondary | ICD-10-CM | POA: Diagnosis not present

## 2018-08-14 MED ORDER — DILTIAZEM HCL ER COATED BEADS 120 MG PO CP24: 120.0000 mg | ORAL_CAPSULE | Freq: Every day | ORAL | 3 refills | Status: DC

## 2018-08-14 NOTE — Patient Instructions (Signed)

## 2018-08-19 ENCOUNTER — Ambulatory Visit: Payer: PPO | Admitting: Podiatry

## 2018-08-21 DIAGNOSIS — F17200 Nicotine dependence, unspecified, uncomplicated: Secondary | ICD-10-CM | POA: Diagnosis not present

## 2018-08-21 DIAGNOSIS — Z972 Presence of dental prosthetic device (complete) (partial): Secondary | ICD-10-CM | POA: Diagnosis not present

## 2018-08-21 DIAGNOSIS — J384 Edema of larynx: Secondary | ICD-10-CM | POA: Diagnosis not present

## 2018-08-21 DIAGNOSIS — Z8521 Personal history of malignant neoplasm of larynx: Secondary | ICD-10-CM | POA: Diagnosis not present

## 2018-09-01 ENCOUNTER — Encounter: Payer: Self-pay | Admitting: Internal Medicine

## 2018-09-01 ENCOUNTER — Ambulatory Visit (INDEPENDENT_AMBULATORY_CARE_PROVIDER_SITE_OTHER): Payer: PPO | Admitting: Internal Medicine

## 2018-09-01 VITALS — BP 102/68 | HR 78 | Ht 71.0 in | Wt 176.0 lb

## 2018-09-01 DIAGNOSIS — F17200 Nicotine dependence, unspecified, uncomplicated: Secondary | ICD-10-CM

## 2018-09-01 DIAGNOSIS — Z23 Encounter for immunization: Secondary | ICD-10-CM | POA: Diagnosis not present

## 2018-09-01 DIAGNOSIS — J449 Chronic obstructive pulmonary disease, unspecified: Secondary | ICD-10-CM | POA: Diagnosis not present

## 2018-09-01 MED ORDER — TIOTROPIUM BROMIDE MONOHYDRATE 2.5 MCG/ACT IN AERS: 2.0000 | INHALATION_SPRAY | Freq: Every day | RESPIRATORY_TRACT | 0 refills | Status: DC

## 2018-09-01 NOTE — Progress Notes (Signed)
Chief Complaint  Patient presents with  . 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 Mediastinal nodes Stable Oct 2009 - July 2011; no further fu  4.RUL pulmonary nodule with stage IA non-small cell lung cancer - May 2013  - - May 2013  - 1.9cm T1a, N0, M0  Stage 1A NSCLC with 0.3cm carcinoid - s/p lobectomy - December 2013 CT chest: No recurrence - July 2014 CT chest - no recurrence. RML nodule stabvle since Dec 2013 - JAn 2016 - no recurrence   02/06/2017 Follow up : COPD/Lung cancer hx /PFT  Patient presents for a one-month follow-up. She was seen last visit with a COPD exacerbation. He was treated with antibiotics and steroids. Patient is feeling better with decreased cough, congestion. Patient had a PFT on 01/31/2017 that showed an FEV1 at 87%, ratio 64, FVC 102%, DLCO 56%. This is improved since 2013 (59% in 2013 -pre lobectomy)  Using stiolto every other day.  Hx of lung cancer , Has upcoming CT chest i   OV  07/04/2017  Chief Complaint  Patient presents with  . Follow-up    Pt c/o hoarseness x months. Pt saw Dr. Redmond Baseman and had a biopsy of vocal cord. Pt c/o prod cough with white mucus and sinus congestion. Pt states his SOB has slightly worsened as well.     Follow-up multiple. He presents with his wife    COPD: He is on inhaler therapy. He needs his flu shot but he has sinus issues going on. He used to be obese but he has lost weight intentionally. But now he is  unintentional ongoing weight loss  Smoking: He continues to smoke. He is unable to quit  Lung cancer: July 2018 he had CT scan of the chest that shows it is in complete remission I review the results but did not visualized image  New issue: For the last 2 months he said hoarseness of voice. He went and saw Dr. Redmond Baseman of ENT. Wife showed me the pictures of his ENT exam. 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 He is on observation therapy versus repeat biopsy.  New issue: Is complaining of 2 months of significant sinus congestion with clear and green drainage. Couple months ago he did try Levaquin without relief. There is no fever but he says that the sinus congestion is making for him   OV 11/26/2017  Chief Complaint  Patient presents with  . Follow-up    Pt states he has been doing good since last visit. States he finished cancer treatments and is very hoarse from that. Has complaints of congestion, coughing. Denies any SOB.  67 year old male with COPD not otherwise specified. He is stable in terms of his COPD with his inhalers.  He is up-to-date with his flu shot.  COPD CAT score is 10 and shows good control of symptoms with minimal symptom  burden.  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  He is already had right-sided cancer for which she has had lobectomy and left lower lobe cancer for which she had radiation.  Both were non-small cell lung cancer of the lung.  It is most recent CT scan of the chest November 04, 2017 and 9 mm right paratracheal node and a 5 mm right upper lobe note have been noticed.  In addition to his chronic radiation changes in the left lower lobe.  I did not personally visualized these films.        OV 09/01/2018  Subjective:  Patient ID: BARRINGTON WORLEY, male , DOB: 1950-12-17 , age 45 y.o. , MRN: [id number] , ADDRESS: [place] [id number]   09/01/2018 -   Chief Complaint  Patient presents with  . Follow-up    Doing well no problems at this time.     HPI YASHAS CAMILLI 67 y.o. -presents for follow-up.  Is a 54-month follow-up.  In the interim he had a CT scan of the chest in August 2019 that I visualized and reviewed the report.  His nodules appear stable.  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  Therefore COPD CAT score is higher than before.  Also he is not taking any schedule inhalers.  His wife reports that this because he went into the donut hole and since then only takes albuterol as needed.  He seemed a little bit puzzled that he has to take a daily inhaler for his COPD.  In terms of his throat cancer he is having bimonthly follow-ups between Dr. Tammi Klippel in radiology and Dr. Redmond Baseman in ENT.  He has repeated laryngoscopy for this.  He wants have a flu shot. Still smokes    CAT COPD Symptom & Quality of Life Score ([employer] trademark) 0 is no burden. 5 is highest burden 11/26/2017  09/01/2018   Never Cough -> Cough all the time 1 5  No phlegm in chest -> Chest is full of phlegm 3 2  No chest tightness -> Chest feels very tight 0 0  No dyspnea for 1 flight stairs/hill -> Very dyspneic for 1 flight of stairs 0 1  No limitations for ADL at home -> Very limited with ADL at home 2 0  Confident leaving home -> Not at all confident leaving home 0 2  Sleep soundly -> Do not sleep soundly because of lung condition 0 4  Lots of Energy -> No energy at all 4 4  TOTAL Score (max 40)  10 18     ROS - per HPI     has a past medical history of Allergy, Anemia, Anginal pain ([place]), Arthritis, Asthma, Back pain, Blood transfusion, Bronchitis, Cancer ([place]) (dx'd 2013), Chronic kidney disease, COPD (chronic obstructive pulmonary disease) ([place]), Coronary artery disease, Depression, Emphysema, Family history of adverse reaction to anesthesia, GERD (gastroesophageal reflux disease),  Glaucoma, H/O hiatal hernia, Hypertension, Insomnia, Lung mass, Myocardial infarct ([place]), 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   reports that he has been smoking cigarettes. He has a 81.00 pack-year smoking history. He has never used smokeless tobacco.  Past Surgical History:  Procedure Laterality  Date  . CARDIAC CATHETERIZATION  2006/2008/2009  . CARPAL TUNNEL RELEASE     bilateral  . COLONOSCOPY    . CORONARY ANGIOPLASTY WITH STENT PLACEMENT     1 stent  . ELBOW SURGERY     ulnar nerve; left  . ESOPHAGOGASTRODUODENOSCOPY    . EYE SURGERY     pt. denies  . FUDUCIAL PLACEMENT Left 08/24/2016   Procedure: PLACEMENT OF FUDUCIAL;  Surgeon: Melrose Nakayama, MD;  Location: [facility];  Service: Thoracic;  Laterality: Left;  . INGUINAL HERNIA REPAIR  1990's   double inguinal  . LOBECTOMY  03/17/2012   upper right side with resection  . MICROLARYNGOSCOPY WITH CO2 LASER AND EXCISION OF VOCAL CORD LESION N/A 06/28/2017   Procedure: MICROLARYNGOSCOPY WITH BIOPSY;  Surgeon: Melida Quitter, MD;  Location: [facility];  Service: ENT;  Laterality: N/A;  . MICROLARYNGOSCOPY WITH CO2 LASER AND EXCISION OF VOCAL CORD LESION N/A 07/19/2017   Procedure: MICROLARYNGOSCOPY WITH CO2 LASER AND EXCISION OF VOCAL CORD LESION;  Surgeon: Melida Quitter, MD;  Location: [facility];  Service: ENT;  Laterality: N/A;  MICRO DIRECT LARYNGOSCOPY WITH CO2 LASER VOCAL CORD STRIPPING/POSS JET VENTURI VENTILATION  . REFRACTIVE SURGERY     bilaterally  . ROTATOR CUFF REPAIR     left  . VIDEO BRONCHOSCOPY WITH ENDOBRONCHIAL NAVIGATION N/A 08/24/2016   Procedure: VIDEO BRONCHOSCOPY WITH ENDOBRONCHIAL NAVIGATION;  Surgeon: Melrose Nakayama, MD;  Location: [facility];  Service: Thoracic;   Laterality: N/A;    Allergies  Allergen Reactions  . Actos [Pioglitazone] Swelling    EDEMA REACTION UNSPECIFIED  . Avandia [Rosiglitazone] Swelling    SWELLING REACTION UNSPECIFIED   . Dilaudid [Hydromorphone Hcl] Other (See Comments)    Made him crazy    Immunization History  Administered Date(s) Administered  . Influenza Split 08/21/2012, 08/25/2013  . Influenza Whole 06/06/2009, 07/07/2011  . Influenza, High Dose Seasonal PF 08/13/2017  . Influenza,inj,Quad PF,6+ Mos 10/25/2014, 08/17/2016  . Influenza-Unspecified 08/09/2010, 08/13/2017  . Pneumococcal Conjugate-13 10/02/2013  . Pneumococcal Polysaccharide-23 11/11/2008  . Tdap 09/09/2012  . Zoster 09/09/2012    Family History  Problem Relation Age of Onset  . COPD Mother   . Diabetes Mother   . Hypertension Mother   . Heart attack Father   . Diabetes Father   . Anesthesia problems Neg Hx   . Hypotension Neg Hx   . Malignant hyperthermia Neg Hx   . Pseudochol deficiency Neg Hx      Current Outpatient Medications:  .  acetaminophen (TYLENOL) 500 MG tablet, Take 1,000 mg by mouth every 6 (six) hours as needed for moderate pain., Disp: , Rfl:  .  albuterol (PROVENTIL HFA;VENTOLIN HFA) 108 (90 Base) MCG/ACT inhaler, Inhale 2 puffs into the lungs every 6 (six) hours as needed for wheezing or shortness of breath., Disp: 1 Inhaler, Rfl: 0 .  albuterol (PROVENTIL) (2.5 MG/3ML) 0.083% nebulizer solution, Take 3 mLs (2.5 mg total) by nebulization every 4 (four) hours as needed for wheezing or shortness of breath., Disp: 150 mL, Rfl: 1 .  atorvastatin (LIPITOR) 40 MG tablet, Take 40 mg by mouth daily., Disp: , Rfl:  .  diltiazem (CARDIZEM CD) 120 MG 24 hr capsule, Take 1 capsule (120 mg total) by mouth daily., Disp: 90 capsule, Rfl: 3 .  diphenhydramine-acetaminophen (TYLENOL PM) 25-500 MG TABS tablet, Take 1 tablet by mouth at bedtime as needed (sleep). , Disp: , Rfl:  .  Docosanol (ABREVA EX), Apply 1 application topically  daily as  needed (cold sores)., Disp: , Rfl:  .  FLUoxetine (PROZAC) 40 MG capsule, Take 1 capsule (40 mg total) by mouth daily., Disp: 90 capsule, Rfl: 3 .  fluticasone (FLONASE) 50 MCG/ACT nasal spray, Place 2 sprays into both nostrils as needed. , Disp: , Rfl: 5 .  gabapentin (NEURONTIN) 300 MG capsule, TAKE 1 CAPSULE BY MOUTH THREE TIMES DAILY, Disp: 270 capsule, Rfl: 0 .  ipratropium-albuterol (DUONEB) 0.5-2.5 (3) MG/3ML SOLN, USE 1 VIAL IN NEBULIZER 4 TIMES A DAY (Patient taking differently: Inhale 1 vial every 6 hours as needed for shortness of breath), Disp: 360 mL, Rfl: 5 .  LANTUS 100 UNIT/ML injection, Inject 4 Units into the skin every morning. , Disp: , Rfl: 3 .  losartan (COZAAR) 25 MG tablet, Take 25 mg by mouth daily., Disp: , Rfl:  .  Multiple Vitamin (MULTIVITAMIN WITH MINERALS) TABS, Take 1 tablet by mouth daily., Disp: , Rfl:  .  nitroGLYCERIN (NITROSTAT) 0.4 MG SL tablet, Place 0.4 mg under the tongue every 5 (five) minutes as needed for chest pain., Disp: , Rfl:  .  ranitidine (ZANTAC) 150 MG capsule, Take 1 capsule (150 mg total) by mouth 2 (two) times daily. (Patient taking differently: Take 150 mg by mouth daily. ), Disp: 60 capsule, Rfl: 11 .  traZODone (DESYREL) 100 MG tablet, TAKE ONE TABLET BY MOUTH EVERY NIGHT AT BEDTIME, Disp: 90 tablet, Rfl: 0 .  Tiotropium Bromide Monohydrate (SPIRIVA RESPIMAT) 2.5 MCG/ACT AERS, Inhale 2 puffs into the lungs daily., Disp: 2 Inhaler, Rfl: 0  Current Facility-Administered Medications:  .  albuterol (PROVENTIL) (2.5 MG/3ML) 0.083% nebulizer solution 2.5 mg, 2.5 mg, Nebulization, Once, Tapia, Leisa, PA-C      Objective:   Vitals:   09/01/18 1027  BP: 102/68  Pulse: 78  SpO2: 97%  Weight: 176 lb (79.8 kg)  Height: 5\' 11"  (1.803 m)    Estimated body mass index is 24.55 kg/m as calculated from the following:   Height as of this encounter: 5\' 11"  (1.803 m).   Weight as of this encounter: 176 lb (79.8  kg).  @WEIGHTCHANGE @  [employer]   09/01/18 1027  Weight: 176 lb (79.8 kg)     Physical Exam  General Appearance:    Alert, cooperative, no distress, appears stated age - older , Deconditioned looking - no , OBESE  - no, Sitting on Wheelchair -  no  Head:    Normocephalic, without obvious abnormality, atraumatic  Eyes:    PERRL, conjunctiva/corneas clear,  Ears:    Normal TM's and external ear canals, both ears  Nose:   Nares normal, septum midline, mucosa normal, no drainage    or sinus tenderness. OXYGEN ON  - no . Patient is @ ra   Throat:   Lips, mucosa, and tongue normal; teeth and gums normal. Cyanosis on lips - no. VERY HOARSE VOICE  Neck:   Supple, symmetrical, trachea midline, no adenopathy;    thyroid:  no enlargement/tenderness/nodules; no carotid   bruit or JVD  Back:     Symmetric, no curvature, ROM normal, no CVA tenderness  Lungs:     Distress - no , Wheeze no, Barrell Chest - no, Purse lip breathing - no, Crackles - no   Chest Wall:    No tenderness or deformity.    Heart:    Regular rate and rhythm, S1 and S2 normal, no rub   or gallop, Murmur - no  Breast Exam:    NOT DONE  Abdomen:  Soft, non-tender, bowel sounds active all four quadrants,    no masses, no organomegaly. Visceral obesity - no  Genitalia:   NOT DONE  Rectal:   NOT DONE  Extremities:   Extremities - normal, Has Cane - no, Clubbing - yes, Edema - no  Pulses:   2+ and symmetric all extremities  Skin:   Stigmata of Connective Tissue Disease - no  Lymph nodes:   Cervical, supraclavicular, and axillary nodes normal  Psychiatric:  Neurologic:   Pleasant - yes, Anxious - no, Flat affect - no  CAm-ICU - neg, Alert and Oriented x 3 - yes, Moves all 4s - yes, Speech - normal, Cognition - intact           Assessment:       ICD-10-CM   1. Chronic obstructive pulmonary disease, unspecified COPD type ([place]) J44.9   2. Smoking F17.200        Plan:     Patient Instructions     ICD-10-CM    1. Chronic obstructive pulmonary disease, unspecified COPD type ([place]) J44.9   2. Smoking F17.200     Stable disease However, noticed you are not on scheduled inhalers  Plan  -start spiriva respimat daily - albuterol is for as needed - high dose flu shot 09/01/2018   Followup - 6-9 months for followup; sooner if needed     SIGNATURE    Dr. Brand Males, M.D., F.C.C.P,  Pulmonary and Critical Care Medicine Staff Physician, [place] Director - Interstitial Lung Disease  Program  Pulmonary [facility] [place], [place], [address]  Pager: [phone], If no answer or between  15:00h - 7:00h: call [phone] Telephone: [phone]  10:56 AM 09/01/2018

## 2018-09-01 NOTE — Patient Instructions (Addendum)
ICD-10-CM   1. Chronic obstructive pulmonary disease, unspecified COPD type ([place]) J44.9   2. Smoking F17.200     Stable disease However, noticed you are not on scheduled inhalers  Plan  -start spiriva respimat daily - albuterol is for as needed - high dose flu shot 09/01/2018   Followup - 6-9 months for followup; sooner if needed

## 2018-09-04 ENCOUNTER — Other Ambulatory Visit: Payer: Self-pay | Admitting: Internal Medicine

## 2018-09-04 DIAGNOSIS — J441 Chronic obstructive pulmonary disease with (acute) exacerbation: Secondary | ICD-10-CM

## 2018-09-09 ENCOUNTER — Encounter: Payer: Self-pay | Admitting: Podiatry

## 2018-09-09 ENCOUNTER — Ambulatory Visit: Payer: PPO | Admitting: Podiatry

## 2018-09-09 DIAGNOSIS — E1151 Type 2 diabetes mellitus with diabetic peripheral angiopathy without gangrene: Secondary | ICD-10-CM | POA: Diagnosis not present

## 2018-09-09 DIAGNOSIS — B351 Tinea unguium: Secondary | ICD-10-CM | POA: Diagnosis not present

## 2018-09-09 DIAGNOSIS — M79676 Pain in unspecified toe(s): Secondary | ICD-10-CM

## 2018-09-09 NOTE — Progress Notes (Signed)
Complaint:  Visit Type: Patient returns to my office for continued preventative foot care services. Complaint: Patient states" my nails have grown long and thick and become painful to walk and wear shoes" Patient has been diagnosed with DM with no foot complications. The patient presents for preventative foot care services. No changes to ROS.  Patient has history of throat cancer.  Podiatric Exam: Vascular: dorsalis pedis and posterior tibial pulses are diminished but  palpable bilateral. Capillary return is immediate. Temperature gradient is WNL. Skin turgor WNL  Sensorium: Normal Semmes Weinstein monofilament test. Normal tactile sensation bilaterally. Nail Exam: Pt has thick disfigured discolored nails with subungual debris noted bilateral entire nail hallux through fifth toenails Ulcer Exam: There is no evidence of ulcer or pre-ulcerative changes or infection. Orthopedic Exam: Muscle tone and strength are WNL. No limitations in general ROM. No crepitus or effusions noted. Foot type and digits show no abnormalities. HAV  B/L.  Disfigured great mtoes n B/L. Skin: No Porokeratosis. No infection or ulcers  Diagnosis:  Onychomycosis, , Pain in right toe, pain in left toes  Treatment & Plan Procedures and Treatment: Consent by patient was obtained for treatment procedures.   Debridement of mycotic and hypertrophic toenails, 1 through 5 bilateral and clearing of subungual debris. No ulceration, no infection noted. Proximal nail fold left hallux is red and swollen. Return Visit-Office Procedure: Patient instructed to return to the office for a follow up visit 3 months for continued evaluation and treatment.    Gardiner Barefoot DPM

## 2018-10-10 ENCOUNTER — Telehealth: Payer: Self-pay | Admitting: Internal Medicine

## 2018-10-10 NOTE — Telephone Encounter (Signed)
LMTCB

## 2018-10-13 NOTE — Telephone Encounter (Signed)
Pt's wife is calling back  [phone]

## 2018-10-13 NOTE — Telephone Encounter (Signed)
Wife came in for an office visit and asked about Spiriva samples. I gave her a sample for pt and advised her to try patient assistance  She agreed and financial assistance paperwork was given. Nothing further is needed.

## 2018-10-13 NOTE — Telephone Encounter (Signed)
ATC patient, unable to reach [facility] x2 on preferred phone number listed for patient.

## 2018-10-18 ENCOUNTER — Other Ambulatory Visit: Payer: Self-pay | Admitting: Family Medicine

## 2018-10-28 ENCOUNTER — Telehealth: Payer: Self-pay | Admitting: *Deleted

## 2018-10-28 NOTE — Telephone Encounter (Signed)
Called patient to alter fu appt. with Dr. Tammi Klippel due to Dr. Tammi Klippel being on vacation, rescheduled for 11-06-18 @ 10:15 am, spoke with patient's wife- Jobe Gibbon and she is aware of this appt. Change and is good with it

## 2018-10-30 ENCOUNTER — Ambulatory Visit: Payer: PPO | Admitting: Radiation Oncology

## 2018-11-06 ENCOUNTER — Other Ambulatory Visit: Payer: Self-pay

## 2018-11-06 ENCOUNTER — Ambulatory Visit
Admission: RE | Admit: 2018-11-06 | Discharge: 2018-11-06 | Disposition: A | Discharge status: Home / Self Care | Payer: PPO | Source: Ambulatory Visit | Attending: Radiation Oncology | Admitting: Radiation Oncology

## 2018-11-06 ENCOUNTER — Encounter: Payer: Self-pay | Admitting: Radiation Oncology

## 2018-11-06 VITALS — BP 131/76 | HR 75 | Temp 97.5°F | Resp 20 | Ht 71.0 in | Wt 177.2 lb

## 2018-11-06 DIAGNOSIS — C3411 Malignant neoplasm of upper lobe, right bronchus or lung: Secondary | ICD-10-CM

## 2018-11-06 DIAGNOSIS — C3432 Malignant neoplasm of lower lobe, left bronchus or lung: Secondary | ICD-10-CM | POA: Insufficient documentation

## 2018-11-06 DIAGNOSIS — Z923 Personal history of irradiation: Secondary | ICD-10-CM | POA: Diagnosis not present

## 2018-11-06 DIAGNOSIS — C320 Malignant neoplasm of glottis: Secondary | ICD-10-CM

## 2018-11-06 DIAGNOSIS — Z85118 Personal history of other malignant neoplasm of bronchus and lung: Secondary | ICD-10-CM | POA: Diagnosis not present

## 2018-11-06 NOTE — Progress Notes (Signed)
Weight and vitals stable. Denies pain or difficulty associated with swallowing but continues with hoarse voice which seems to have stabilized. He reports a chronic cough with thick sticky white sputum, which he reports is worse after laying down. He denies increased shortness of breath, fever, chills or night sweats. Reports pain in his ribs from frequent coughing spells in an attempt to bring up mucous. He reports fatigue due to difficulty sleeping.  He reports a healthy appetite.   Reports he was evaluated by Dr. Redmond Baseman and scoped approximately two months ago. Per patient Redmond Baseman wishes to alternate follow up visits every three month with Dr. Tammi Klippel. In addition, Redmond Baseman will perform scopes regularly for financial/insurance reasons.   BP 131/76 (BP Location: Left Arm, Patient Position: Sitting)   Pulse 75   Temp (!) 97.5 F (36.4 C) (Oral)   Resp 20   Ht 5\' 11"  (1.803 m)   Wt 177 lb 4 oz (80.4 kg)   SpO2 95%   BMI 24.72 kg/m  Wt Readings from Last 3 Encounters:  11/06/18 177 lb 4 oz (80.4 kg)  09/01/18 176 lb (79.8 kg)  08/14/18 175 lb 12.8 oz (79.7 kg)

## 2018-11-06 NOTE — Progress Notes (Signed)
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  Interval Since Last Radiation:  8.5 months  08/26/2017 - 10/04/2017:  The larynx was treated to 63 Gy in 28 fractions of 2.25 Gy.  09/17/2016 - 09/24/2016 SBRT Treatment:  The LLL target was treated to 54Gy in 16fractions of 18Gy  Narrative:  In summary, this is a pleasant gentleman with a history of Stage IA NSCLC, squamous cell of the left lower lobe of the lung who completed SBRT to the target in November 2017. He also has a history of Stage I, NSCLC, squamous cell of the right upper lobe, which was resected in 2013 with Dr. [name]. He was followed in surveillance which is when his most recent cancer was identified. He has been followed in surveillance since SBRT and has had several mediastinal nodes that have been stable at 9-10 mm. His follow up Chest CT on 05/06/17 revealed no new concerning lung lesions, and post radiotherapy change in the LLL. A PET scan on 05/31/17 showed low-grade activity in a mildly enlarged right lower paratracheal lymph node with max SUV of 4.1 (formerly 4.6). There was also low-grade activity in indistinct left hilar lymph node, max SUV of 3.0. 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  Additionally, there was diffuse accentuated activity in the proximal 75% of the stomach associated with mild wall thickening but also nondistention.  This was further evaluated with endoscopy  by Dr. Watt Climes and no concerning findings were noted. He underwent repeat microlaryngoscopy with CO2 laser stripping of the right true and false vocal cords on 07/19/17 with Dr. Redmond Baseman. Biopsy of the right false vocal cord showed mild squamous dysplasia. Biopsy of the right true vocal cord showed invasive squamous cell carcinoma.  This was treated with radiotherapy which was completed between 08/26/2017 - 10/04/2017. He tolerated radiation treatment relatively well.   On follow up PET scan from 01/07/2018, there were no typical findings to suggest metastatic head/neck cancer, disease stability in the chest with similar low-level hypermetabolism within a borderline enlarged right paratracheal node and presumed evolving radiation change in the superior segment of the left lower lobe.  No new lung lesions were noted.  Recent TSH was normal at 1.727 on 01/07/18.  Interval History: He returns today for routine follow-up  and nasopharyngoscopy.  Due to significant financial concerns/burden discussed with Dr. Redmond Baseman, the recommendation is to have indirect laryngoscopy with lighted mirror exam here at the cancer center every 6 months, alternating with transnasal fiberoptic nasopharyngoscopy with Dr. Redmond Baseman every 6 months in between visits. His most recent CT Chest scan from 06/24/18 revealed: a few scattered subcentimeter pulmonary nodules in the lungs bilaterally, the largest of which are new in the left upper lobe measuring up to 7 mm. These are nonspecific but warrant close attention on follow-up studies to exclude the possibility of metastatic disease.  There are stable changes of postradiation mass-like fibrosis in the superior segment of the left lower lobe and iffuse bronchial wall thickening  with mild centrilobular and mild-to-moderate paraseptal emphysema; suggestive of underlying COPD. He is also followed by Dr. Chase Caller in pulmonology.                           He continues to alternate his follow up care with ENT  c/o Dr. Redmond Baseman and was most recently seen 08/21/2018. He underwent an office fiberoptic  nasolaryngoscopy at that time which was normal by written report from Dr. Iona Beard office which was reviewed today via [place].  On review of systems, he denies any pain or difficulty associated with swallowing but continues with hoarse voice which seems to have stabilized. He reports a chronic cough with thick sticky white sputum, which he reports occurs in the morning and has been improving. Unfortunately, he continues to smoke but acknowledges the need to quit and expresses desire to quit.  He denies increased shortness of breath, chest pain, fever, chills or night sweats. He reports fatigue due to difficulty sleeping but otherwise is doing well in general.  He reports a healthy appetite and is maintaining his weight.  Overall, he is pleased with his progress to date.  ALLERGIES:  is allergic to actos [pioglitazone]; avandia [rosiglitazone]; and dilaudid [hydromorphone hcl].  Meds: Current Outpatient Medications  Medication Sig Dispense Refill  . acetaminophen (TYLENOL) 500 MG tablet Take 1,000 mg by mouth every 6 (six) hours as needed for moderate pain.    Marland Kitchen albuterol (PROVENTIL HFA;VENTOLIN HFA) 108 (90 Base) MCG/ACT inhaler Inhale 2 puffs into the lungs every 6 (six) hours as needed for wheezing or shortness of breath. 1 Inhaler 0  . atorvastatin (LIPITOR) 40 MG tablet Take 40 mg by mouth daily.    Marland Kitchen diltiazem (CARDIZEM CD) 120 MG 24 hr capsule Take 1 capsule (120 mg total) by mouth daily. 90 capsule 3  . FLUoxetine (PROZAC) 40 MG capsule Take 1 capsule (40 mg total) by mouth daily. 90 capsule 3  . gabapentin (NEURONTIN) 300 MG capsule TAKE 1 CAPSULE BY MOUTH THREE TIMES DAILY 270 capsule 0  . ipratropium-albuterol (DUONEB) 0.5-2.5 (3) MG/3ML SOLN USE 1 VIAL IN NEBULIZER 4 TIMES A DAY 360 mL 5  . LANTUS 100 UNIT/ML injection Inject 4 Units into the skin every morning.   3  . losartan (COZAAR) 25 MG  tablet Take 25 mg by mouth daily.    . Multiple Vitamin (MULTIVITAMIN WITH MINERALS) TABS Take 1 tablet by mouth daily.    . ranitidine (ZANTAC) 150 MG capsule Take 1 capsule (150 mg total) by mouth 2 (two) times daily. (Patient taking differently: Take 150 mg by mouth daily. ) 60 capsule 11  . Tiotropium Bromide Monohydrate (SPIRIVA RESPIMAT) 2.5 MCG/ACT AERS Inhale 2 puffs into the lungs daily. 2 Inhaler 0  . traZODone (DESYREL) 100 MG tablet TAKE ONE TABLET BY MOUTH EVERY NIGHT AT BEDTIME 90 tablet 0  . Docosanol (ABREVA EX) Apply 1 application topically daily as needed (cold sores).    . fluticasone (FLONASE) 50 MCG/ACT nasal spray Place 2 sprays into both nostrils as needed.   5  . nitroGLYCERIN (NITROSTAT) 0.4 MG SL tablet Place 0.4 mg under the tongue every 5 (five) minutes as needed for chest pain.     Current Facility-Administered Medications  Medication Dose Route Frequency Provider Last Rate Last Dose  . albuterol (PROVENTIL) (2.5 MG/3ML) 0.083% nebulizer solution 2.5 mg  2.5 mg Nebulization Once Delsa Grana, PA-C        Physical Findings:  height  is 5\' 11"  (1.803 m) and weight is 177 lb 4 oz (80.4 kg). His oral temperature is 97.5 F (36.4 C) (abnormal). His blood pressure is 131/76 and his pulse is 75. His respiration is 20 and oxygen saturation is 95%.  Pain Assessment Pain Score: 0-No pain/10  In general this is a well appearing Caucasian male in no acute distress. He's alert and oriented x4 and appropriate throughout the examination. HEENT reveals that the patient is normocephalic, atraumatic. EOMs are intact. PERRLA. Skin is intact without any evidence of gross lesions. Cardiovascular exam reveals a regular rate and rhythm, no clicks rubs or murmurs are auscultated. Chest is clear to auscultation bilaterally. Lymphatic assessment is performed and does not reveal any adenopathy in the cervical, supraclavicular, axillary, or inguinal chains. Abdomen has active bowel sounds in all  quadrants and is intact. The abdomen is soft, non tender, non distended. Lower extremities are negative for pretibial pitting edema, deep calf tenderness, cyanosis or clubbing. There is mild hyperpigmentation to the treatment field and a little palpable fibrosis midline, but no palpable lymphadenopathy in the cervical or supraclavicular chains.   PROCEDURE NOTE: After obtaining consent, indirect laryngoscopy is performed. The nasopharynx, posterior oropharynx and larynx appear normal. He has ill fitting dentures in place. There is mild edema of the glottis and a bulge on the right tonsil.  Vocal cords open and close symmetrically on phonation and there are no obvious lesions or ulcerations noted.  Piriform sinuses appear patent with minimal secretions noted. There is moderate arytenoid edema, unchanged from prior exams.    Lab Findings: Lab Results  Component Value Date   WBC 16.9 (H) 04/01/2018   HGB 11.4 (L) 04/01/2018   HCT 34.2 (L) 04/01/2018   MCV 93.2 04/01/2018   PLT 318 04/01/2018     Radiographic Findings: No results found.  Impression/Plan: 1. 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  No evidence of residual or metastatic disease in the head or neck seen on recent indirect laryngoscopy. We will plan to repeat a TSH annually in March/April and plan to alternate follow-up visits with Dr. Redmond Baseman every 3 months for the first 1-2 years and then every 6 months for year 2-3 and every 8 months for years 3-5 with annual follow up thereafter.  Per patient discussion with Dr. Redmond Baseman, for financial purposes, it is agreeable to have in office nasopharyngoscopy with Dr. Redmond Baseman every 6 months and will have indirect laryngoscopy here every 6 months, alternately.  2. Stage IA, T1aN0squamous cell carcinoma of the right upper lobe and left lower lobe of lung. We will continue to  monitor his disease with serial CT Chest scans every 6 months with follow up visits to discuss findings.  He knows to call with any questions or concerns in the interim.  He is due for repeat CT Chest in 12/2018 so I will plan to call him with those results.  Pending those results are stable, he will return for scheduled follow up visit in 6 months with a CT Chest prior to that visit. We will coordinate these visits with his follow up for repeat indirect laryngoscopy (mirror exam) to save him multiple trips.    Nicholos Johns, PA-C    Tyler Pita, MD  [facility] Direct Dial: [phone]  Fax: [phone] [email or web address]  Skype  [email or web address]  This document serves as a  record of services personally performed by Tyler Pita, MD and Freeman Caldron, PA-C. It was created on their behalf by Wilburn Mylar, a trained medical scribe. The creation of this record is based on the scribe's personal observations and the provider's statements to them. This document has been checked and approved by the attending provider.

## 2018-11-10 ENCOUNTER — Encounter: Payer: Self-pay | Admitting: Family Medicine

## 2018-11-10 ENCOUNTER — Other Ambulatory Visit: Payer: PPO

## 2018-11-10 ENCOUNTER — Ambulatory Visit (INDEPENDENT_AMBULATORY_CARE_PROVIDER_SITE_OTHER): Payer: PPO | Admitting: Family Medicine

## 2018-11-10 VITALS — BP 110/60 | HR 80 | Temp 98.0°F | Resp 16 | Ht 71.0 in | Wt 172.0 lb

## 2018-11-10 DIAGNOSIS — J0100 Acute maxillary sinusitis, unspecified: Secondary | ICD-10-CM

## 2018-11-10 DIAGNOSIS — K921 Melena: Secondary | ICD-10-CM

## 2018-11-10 MED ORDER — AMOXICILLIN-POT CLAVULANATE 875-125 MG PO TABS: 1.0000 | ORAL_TABLET | Freq: Two times a day (BID) | ORAL | 0 refills | Status: DC

## 2018-11-10 NOTE — Progress Notes (Signed)
Subjective:    Patient ID: [name], male    DOB: 08/27/1951, 68 y.o.   MRN: [id number]  HPI I have not seen the patient in quite some time.  Patient presents with a 2-week history of pain and pressure in both maxillary sinuses.  He has a sinus headache.  He reports rhinorrhea and postnasal drip and drainage.  He reports subjective fevers.  He is tried over-the-counter medication with no improvement.  Past medical history is significant for previous lung cancer in May 2013, status post lobectomy and LLL stage 1 squamous cell cancer in 2017 s/p XRT.  Per the patient he had an EGD and 2018 which was normal.  However he reports 2 weeks of diarrhea.  The stool is black in nature.  With that he reports feeling progressively more tired.  Reoccurs 1-2 times a day.  He denies any hematochezia.  He denies any abdominal discomfort.  He denies any hematemesis.  He denies any chest pain. Past Medical History:  Diagnosis Date  . Allergy   . Anemia   . Anginal pain ([place])   . Arthritis    "hands"  . Asthma   . Back pain    4 deteriorating disc and receives an injection q3-26mon;has been doing this for about 56yrs  . Blood transfusion    as a child  . Bronchitis   . Cancer ([place]) dx'd 2013   Non-small cell lung cancer  . Chronic kidney disease    acute kidney failure post surgery  . COPD (chronic obstructive pulmonary disease) (H[facility])    uses Albuterol and Spiriva daily  . Coronary artery disease    has 1 stent  . Depression    takes Prozac daily  . Emphysema    sees Dr.Ramaswami for this  . Family history of adverse reaction to anesthesia    mother was hard to wake up sometimes  . GERD (gastroesophageal reflux disease)    takes Prilosec daily, EGD nml 06/2017  . Glaucoma    hx of  . H/O hiatal hernia   . Hypertension    takes  HYzaar daily  . Insomnia    takes Trazodone nightly  . Lung mass    right upper lobe  . Myocardial infarct ([place])   . 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 Obesity   . 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 Periodic limb movements of sleep   . Peripheral neuropathy   . Pneumonia    hx of' 68 yo,rd' last time in 2013  . Productive cough    white in color but no odor  . Shortness of breath    with exertion   . Sleep apnea    uses BiPaP; "no longer have sleep apnea since I have lost over 100 lbs"  . Syncope and collapse 05/26/2013  . Thyroid disease   . Type II diabetes mellitus (HCC)    takes Metformin bid and Novolog and Lantus daily   Past Surgical History:  Procedure Laterality Date  . CARDIAC CATHETERIZATION  2006/2008/2009  . CARPAL TUNNEL RELEASE     bilateral  . COLONOSCOPY    . CORONARY ANGIOPLASTY WITH STENT PLACEMENT     1 stent  . ELBOW SURGERY     ulnar nerve; left  . ESOPHAGOGASTRODUODENOSCOPY    . EYE SURGERY     pt. denies  . FUDUCIAL PLACEMENT Left 08/24/2016   Procedure: PLACEMENT OF FUDUCIAL;  Surgeon: Remo Lipps  Chaya Jan, MD;  Location: [facility];  Service: Thoracic;  Laterality: Left;  . INGUINAL HERNIA REPAIR  1990's   double inguinal  . LOBECTOMY  03/17/2012   upper right side with resection  . MICROLARYNGOSCOPY WITH CO2 LASER AND EXCISION OF VOCAL CORD LESION N/A 06/28/2017   Procedure: MICROLARYNGOSCOPY WITH BIOPSY;  Surgeon: Melida Quitter, MD;  Location: [facility];  Service: ENT;  Laterality: N/A;  . MICROLARYNGOSCOPY WITH CO2 LASER AND EXCISION OF VOCAL CORD LESION N/A 07/19/2017   Procedure: MICROLARYNGOSCOPY WITH CO2 LASER AND EXCISION OF VOCAL CORD LESION;  Surgeon: Melida Quitter, MD;  Location: [facility];  Service: ENT;  Laterality: N/A;  MICRO DIRECT LARYNGOSCOPY WITH CO2 LASER VOCAL CORD STRIPPING/POSS JET VENTURI VENTILATION  . REFRACTIVE SURGERY     bilaterally  . ROTATOR CUFF REPAIR     left  . VIDEO BRONCHOSCOPY WITH ENDOBRONCHIAL NAVIGATION N/A 08/24/2016   Procedure: VIDEO BRONCHOSCOPY WITH ENDOBRONCHIAL NAVIGATION;  Surgeon: Melrose Nakayama, MD;  Location: [facility];  Service:  Thoracic;  Laterality: N/A;   Current Outpatient Medications on File Prior to Visit  Medication Sig Dispense Refill  . acetaminophen (TYLENOL) 500 MG tablet Take 1,000 mg by mouth every 6 (six) hours as needed for moderate pain.    Marland Kitchen albuterol (PROVENTIL HFA;VENTOLIN HFA) 108 (90 Base) MCG/ACT inhaler Inhale 2 puffs into the lungs every 6 (six) hours as needed for wheezing or shortness of breath. 1 Inhaler 0  . atorvastatin (LIPITOR) 40 MG tablet Take 40 mg by mouth daily.    Marland Kitchen diltiazem (CARDIZEM CD) 120 MG 24 hr capsule Take 1 capsule (120 mg total) by mouth daily. 90 capsule 3  . Docosanol (ABREVA EX) Apply 1 application topically daily as needed (cold sores).    Marland Kitchen FLUoxetine (PROZAC) 40 MG capsule Take 1 capsule (40 mg total) by mouth daily. 90 capsule 3  . fluticasone (FLONASE) 50 MCG/ACT nasal spray Place 2 sprays into both nostrils as needed.   5  . gabapentin (NEURONTIN) 300 MG capsule TAKE 1 CAPSULE BY MOUTH THREE TIMES DAILY 270 capsule 0  . ipratropium-albuterol (DUONEB) 0.5-2.5 (3) MG/3ML SOLN USE 1 VIAL IN NEBULIZER 4 TIMES A DAY 360 mL 5  . LANTUS 100 UNIT/ML injection Inject 4 Units into the skin every morning.   3  . losartan (COZAAR) 25 MG tablet Take 25 mg by mouth daily.    . Multiple Vitamin (MULTIVITAMIN WITH MINERALS) TABS Take 1 tablet by mouth daily.    . nitroGLYCERIN (NITROSTAT) 0.4 MG SL tablet Place 0.4 mg under the tongue every 5 (five) minutes as needed for chest pain.    . ranitidine (ZANTAC) 150 MG capsule Take 1 capsule (150 mg total) by mouth 2 (two) times daily. (Patient taking differently: Take 150 mg by mouth daily. ) 60 capsule 11  . Tiotropium Bromide Monohydrate (SPIRIVA RESPIMAT) 2.5 MCG/ACT AERS Inhale 2 puffs into the lungs daily. 2 Inhaler 0  . traZODone (DESYREL) 100 MG tablet TAKE ONE TABLET BY MOUTH EVERY NIGHT AT BEDTIME 90 tablet 0   Current Facility-Administered Medications on File Prior to Visit  Medication Dose Route Frequency Provider Last  Rate Last Dose  . albuterol (PROVENTIL) (2.5 MG/3ML) 0.083% nebulizer solution 2.5 mg  2.5 mg Nebulization Once Delsa Grana, PA-C       Allergies  Allergen Reactions  . Actos [Pioglitazone] Swelling    EDEMA REACTION UNSPECIFIED  . Avandia [Rosiglitazone] Swelling    SWELLING REACTION UNSPECIFIED   . Dilaudid [Hydromorphone Hcl]  Other (See Comments)    Made him crazy   Social History   Socioeconomic History  . Marital status: Married    Spouse name: mary lou  . Number of children: 3  . Years of education: trade   . Highest education level: Not on file  Occupational History  . 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 Financial resource strain: Not on file  . Food insecurity:    Worry: Not on file    Inability: Not on file  . Transportation needs:    Medical: Not on file    Non-medical: Not on file  Tobacco Use  . Smoking status: Current Every Day Smoker    Packs/day: 1.50    Years: 54.00    Pack years: 81.00    Types: Cigarettes  . Smokeless tobacco: Never Used  . Tobacco comment: 1ppd as of 11/26/17  ep  Substance and Sexual Activity  . Alcohol use: No    Comment: no alcolol since 2013  . Drug use: Yes    Types: Cocaine, Marijuana    Comment: quit 1990's  . Sexual activity: Not Currently  Lifestyle  . Physical activity:    Days per week: Not on file    Minutes per session: Not on file  . Stress: Not on file  Relationships  . Social connections:    Talks on phone: Not on file    Gets together: Not on file    Attends religious service: Not on file    Active member of club or organization: Not on file    Attends meetings of clubs or organizations: Not on file    Relationship status: Not on file  . Intimate partner violence:    Fear of current or ex partner: Not on file    Emotionally abused: Not on file    Physically abused: Not on file    Forced sexual activity: Not on file  Other Topics Concern  . 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      Review of Systems  All other systems reviewed and are negative.      Objective:   Physical Exam  Constitutional: He appears well-developed and well-nourished.  HENT:  Right Ear: External ear normal.  Left Ear: External ear normal.  Nose: Mucosal edema and rhinorrhea present. Right sinus exhibits maxillary sinus tenderness. Right sinus exhibits no frontal sinus tenderness. Left sinus exhibits maxillary sinus tenderness. Left sinus exhibits no frontal sinus tenderness.  Mouth/Throat: Oropharynx is clear and moist. No oropharyngeal exudate.  Neck: Neck supple. No thyromegaly present.  Cardiovascular: Normal rate, regular rhythm and normal heart sounds.  Pulmonary/Chest: Effort normal and breath sounds normal. No respiratory distress. He has no wheezes. He has no rales.  Abdominal: Soft. Bowel sounds are normal. He exhibits no distension. There is no abdominal tenderness. There is no rebound and no guarding.  Musculoskeletal:        General: No edema.  Lymphadenopathy:    He has no cervical adenopathy.  Vitals reviewed.  Chronic hoarseness       Assessment & Plan:  Black stool - Plan: CBC with Differential/Platelet, COMPLETE METABOLIC PANEL WITH GFR,  Fecal Globin By Immunochemistry, Iron  Acute maxillary sinusitis, recurrence not specified  Patient is concerned about the sinus infection which I will treat with Augmentin 875 mg p.o. twice daily for 10 days.  He can use Flonase 2 sprays each nostril daily.  However I am more concerned by the black color of his stool and his gradually worsening fatigue.  I recommended a CBC to check for any evidence of anemia.  I will check stool test for blood.  If positive, we will refer back to his gastroenterologist  for repeat EGD to determine the source.  We will also check an iron level as he may need iron replacement as well.  If the stool test is positive, I will empirically start the patient on a proton pump inhibitor while awaiting GI.  Denies taking any iron supplement or Pepto-Bismol that could explain the color change.  I suspect the slow upper GI source of blood loss given the slowly progressive nature of his fatigue.

## 2018-11-11 LAB — COMPLETE METABOLIC PANEL WITH GFR
AG RATIO: 2.1 (calc) (ref 1.0–2.5)
ALT: 13 U/L (ref 9–46)
AST: 16 U/L (ref 10–35)
Albumin: 4.4 g/dL (ref 3.6–5.1)
Alkaline phosphatase (APISO): 83 U/L (ref 40–115)
BUN/Creatinine Ratio: 25 (calc) — ABNORMAL HIGH (ref 6–22)
BUN: 40 mg/dL — ABNORMAL HIGH (ref 7–25)
CALCIUM: 8.7 mg/dL (ref 8.6–10.3)
CO2: 23 mmol/L (ref 20–32)
CREATININE: 1.63 mg/dL — AB (ref 0.70–1.25)
Chloride: 105 mmol/L (ref 98–110)
GFR, EST AFRICAN AMERICAN: 49 mL/min/{1.73_m2} — AB (ref >=60)
GFR, EST NON AFRICAN AMERICAN: 43 mL/min/{1.73_m2} — AB (ref >=60)
Globulin: 2.1 g/dL (calc) (ref 1.9–3.7)
Glucose, Bld: 103 mg/dL — ABNORMAL HIGH (ref 65–99)
POTASSIUM: 4.9 mmol/L (ref 3.5–5.3)
Sodium: 138 mmol/L (ref 135–146)
TOTAL PROTEIN: 6.5 g/dL (ref 6.1–8.1)
Total Bilirubin: 0.4 mg/dL (ref 0.2–1.2)

## 2018-11-11 LAB — CBC WITH DIFFERENTIAL/PLATELET
Absolute Monocytes: 686 cells/uL (ref 200–950)
Basophils Absolute: 13 cells/uL (ref 0–200)
Basophils Relative: 0.2 %
EOS PCT: 0.9 %
Eosinophils Absolute: 59 cells/uL (ref 15–500)
HCT: 41.1 % (ref 38.5–50.0)
Hemoglobin: 13.9 g/dL (ref 13.2–17.1)
Lymphs Abs: 1069 cells/uL (ref 850–3900)
MCH: 32.1 pg (ref 27.0–33.0)
MCHC: 33.8 g/dL (ref 32.0–36.0)
MCV: 94.9 fL (ref 80.0–100.0)
MPV: 11.3 fL (ref 7.5–12.5)
Monocytes Relative: 10.4 %
NEUTROS PCT: 72.3 %
Neutro Abs: 4772 cells/uL (ref 1500–7800)
PLATELETS: 148 10*3/uL (ref 140–400)
RBC: 4.33 10*6/uL (ref 4.20–5.80)
RDW: 13 % (ref 11.0–15.0)
TOTAL LYMPHOCYTE: 16.2 %
WBC: 6.6 10*3/uL (ref 3.8–10.8)

## 2018-11-11 LAB — FECAL GLOBIN BY IMMUNOCHEMISTRY
FECAL GLOBIN RESULT: NOT DETECTED
MICRO NUMBER:: 15485
SPECIMEN QUALITY: ADEQUATE

## 2018-11-11 LAB — IRON: IRON: 31 ug/dL — AB (ref 50–180)

## 2018-11-20 DIAGNOSIS — E1165 Type 2 diabetes mellitus with hyperglycemia: Secondary | ICD-10-CM | POA: Diagnosis not present

## 2018-11-20 DIAGNOSIS — N183 Chronic kidney disease, stage 3 (moderate): Secondary | ICD-10-CM | POA: Diagnosis not present

## 2018-11-20 DIAGNOSIS — E1142 Type 2 diabetes mellitus with diabetic polyneuropathy: Secondary | ICD-10-CM | POA: Diagnosis not present

## 2018-11-20 DIAGNOSIS — Z794 Long term (current) use of insulin: Secondary | ICD-10-CM | POA: Diagnosis not present

## 2018-11-25 ENCOUNTER — Ambulatory Visit: Payer: PPO | Admitting: Nurse Practitioner

## 2018-11-27 NOTE — Addendum Note (Signed)
Encounter addended by: Freeman Caldron, PA-C on: 11/27/2018 2:44 PM  Actions taken: Follow-up modified

## 2018-11-30 IMAGING — PT NM PET TUM IMG RESTAG (PS) SKULL BASE T - THIGH
9 series · 25 of 25 positions shown · non-contrast
Comparison: Multiple exams, including 07/18/2016, and chest CT from
05/06/2017

CLINICAL DATA: Subsequent treatment strategy for squamous cell left
lower lobe lung cancer, completed SB RT in September 2016. History of
squamous cell cancer in the right upper lobe resected by Dr. Fallon
in 2298. Prior right upper lobectomy.

EXAM:
NUCLEAR MEDICINE PET SKULL BASE TO THIGH
TECHNIQUE: 9.2 mCi F-18 FDG was injected intravenously. Full-ring PET imaging
was performed from the skull base to thigh after the radiotracer. CT
data was obtained and used for attenuation correction and anatomic
localization.
FASTING BLOOD GLUCOSE:  Value: 98 mg/dl

[Series 3: pet sk_thigh ac · axial · 5.0mm · 4.07mm/px · z∈[-431,+505]mm · 4 of 235 slices shown]
[im 1/235]
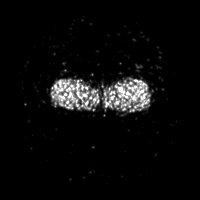
[im 79/235]
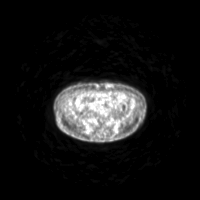
[im 157/235]
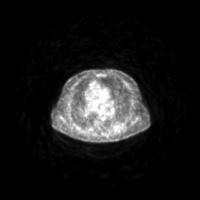
[im 235/235]
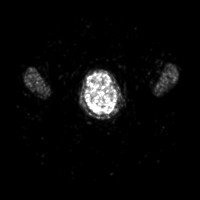

[Series 4: ct sk_thigh 5.0 b31f · axial · 5.0mm · 0.98mm/px · z∈[-431,+505]mm · 4 of 235 slices shown]
[im 1/235]
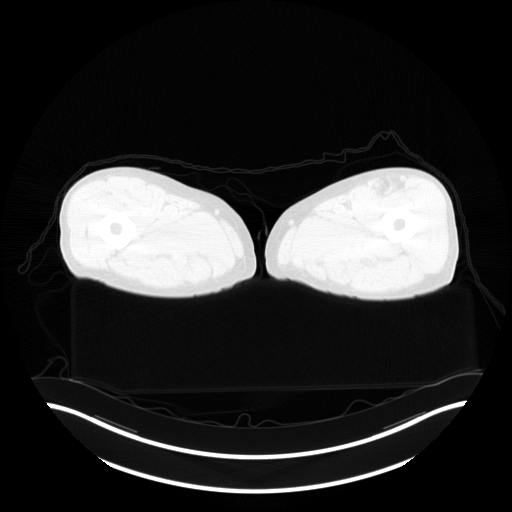
[im 79/235]
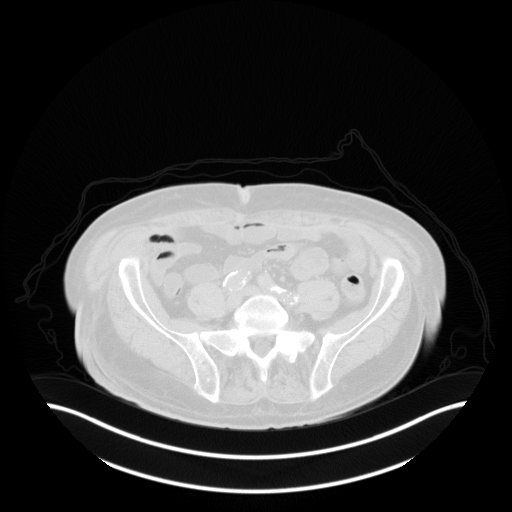
[im 157/235]
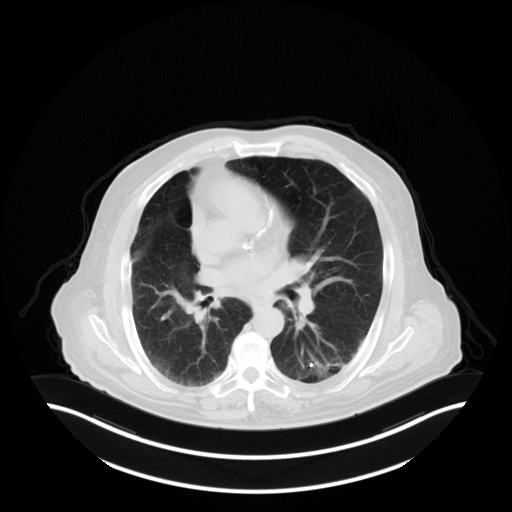
[im 235/235  brain]
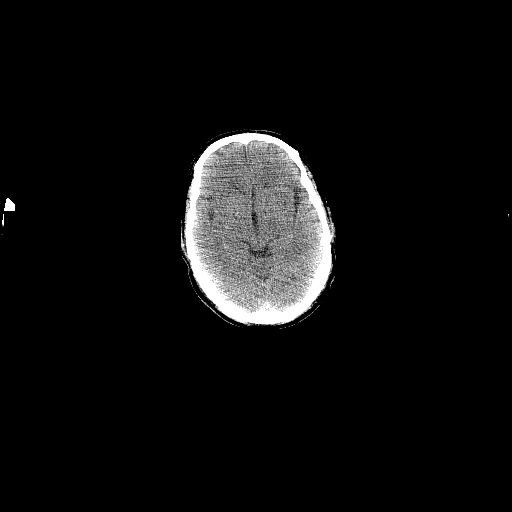

[Series 7: ct sk_thigh 5.0 b70f (id)_bone · axial · 5.0mm · 0.68mm/px · z∈[+61,+333]mm · 2 of 69 slices shown]
[im 1/69  bone]
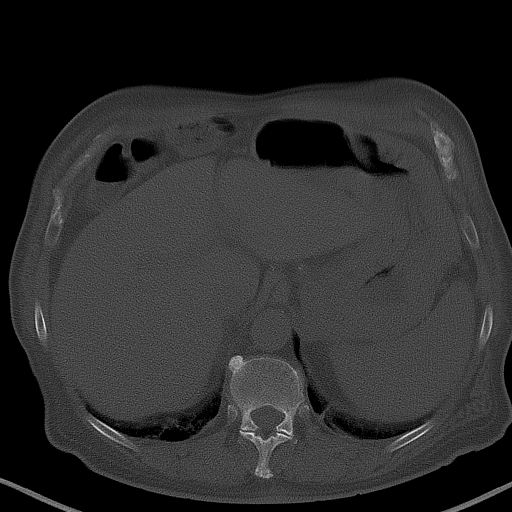
[im 69/69  bone]
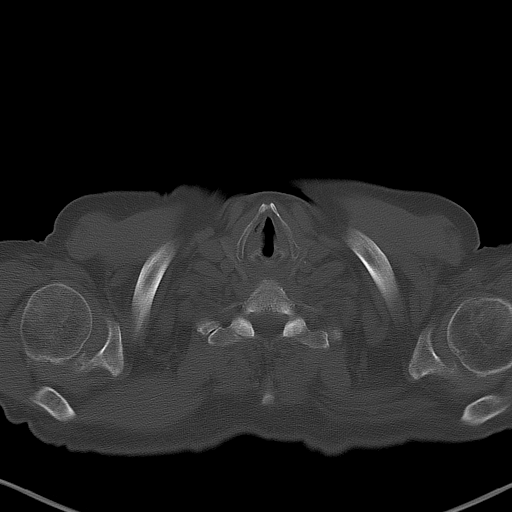

[Series 8: pet sk_thigh nac · axial · 5.0mm · 4.07mm/px · z∈[-431,+505]mm · 5 of 235 slices shown]
[im 1/235]
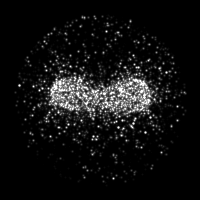
[im 59/235]
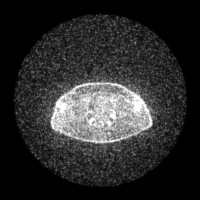
[im 118/235]
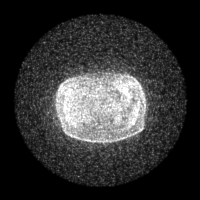
[im 176/235]
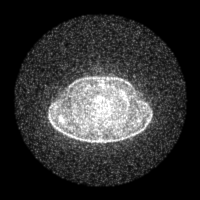
[im 235/235]
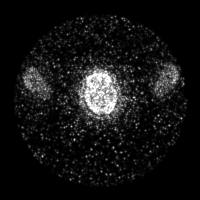

[Series 604: range-ct sk_thigh 5.0 (id)<alpha range> · 2 of 78 slices shown (1 of 2)]
[im 1/78]
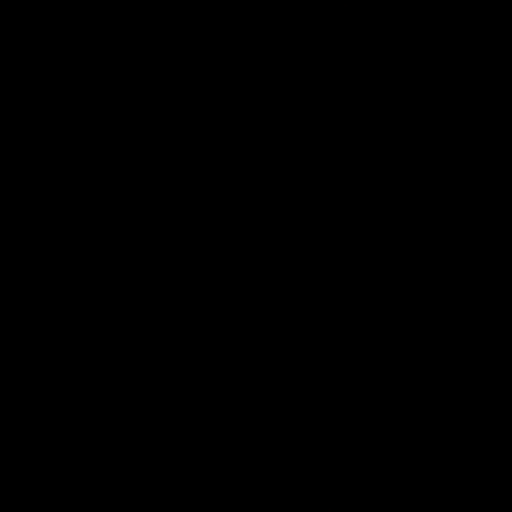
[im 78/78]
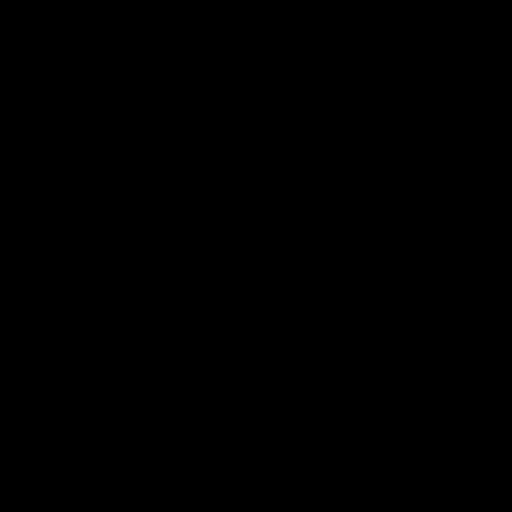

[Series 605: mip collection · coronal · 1.94mm/px · 1 of 32 slices shown]
[im 1/32]
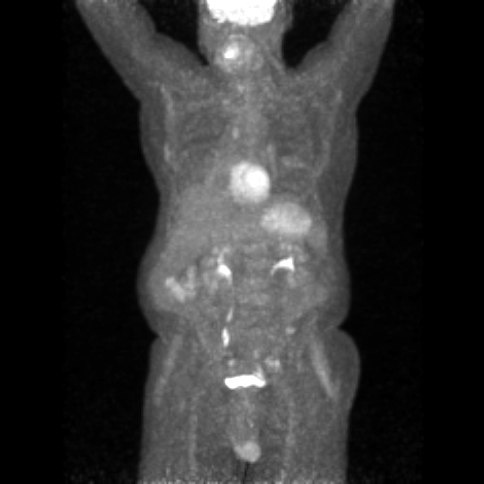

[Series 606: range-ct sk_thigh 5.0 (id)<alpha range> · 5 of 222 slices shown (2 of 2)]
[im 1/222]
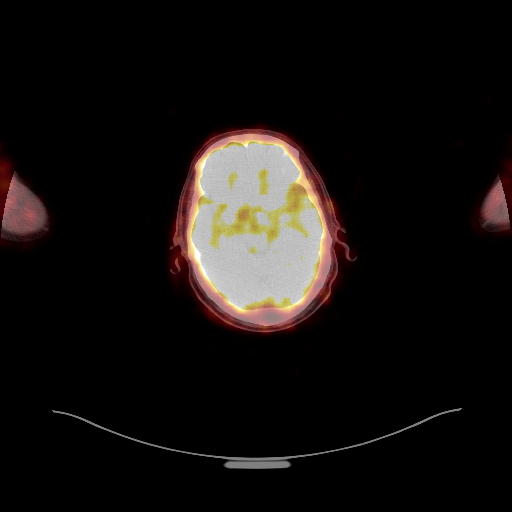
[im 56/222]
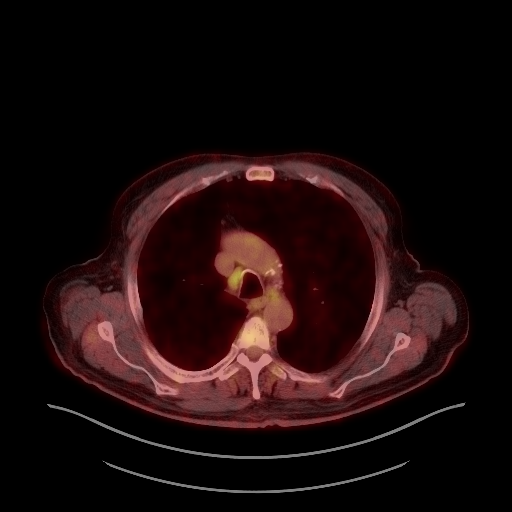
[im 111/222]
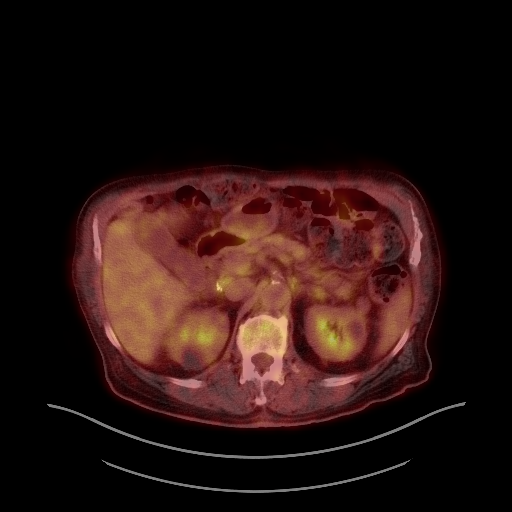
[im 166/222]
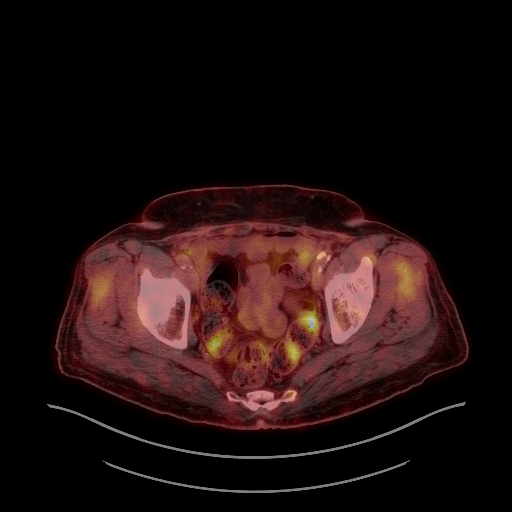
[im 222/222]
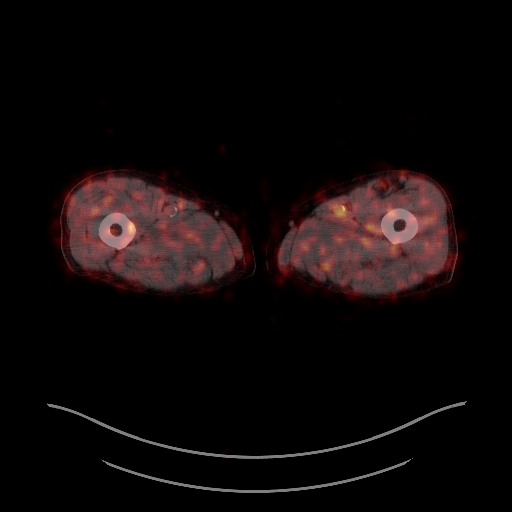

[Series 1204: results mm oncology reading · 5.0mm · 0.91mm/px · 1 of 3 slices shown (1 of 2)]
[im 1/3]
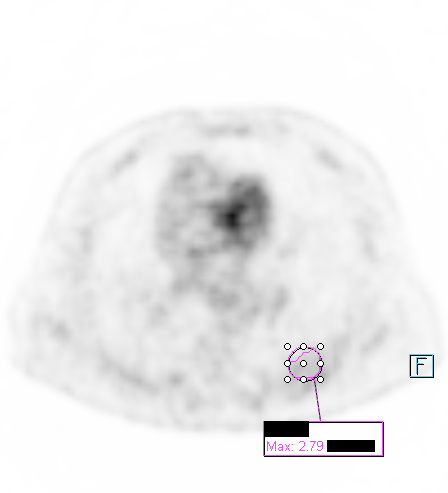

[Series 1393: results mm oncology reading · 5.0mm · 1.23mm/px · 1 of 1 slices shown (2 of 2)]
[im 1/1]
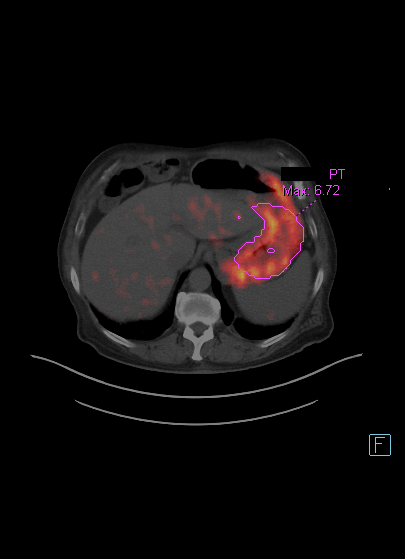

[25 of 25 positions shown; findings below may reference images not displayed]

FINDINGS: NECK

No hypermetabolic lymph nodes in the neck.

Small mucous retention cyst in the left maxillary sinus. Bilateral
carotid bulb atherosclerotic calcification.

Tongue activity anteriorly is thought to likely be physiologic and
does not have a CT correlate.

CHEST

At the site of the previously hypermetabolic superior segment left
lower lobe nodule, there is currently some bandlike airspace opacity
in several small fiducials, but the nodule itself is poorly seen.
Nodule previously had a maximum SUV of 9.9, the local indistinct
airspace opacity in this vicinity currently has a maximum SUV of
2.8. This may well be from radiation pneumonitis.

A right lower paratracheal node measuring 1 cm in short axis
(formerly 1.1 cm) has a maximum SUV of 4.1 (formerly 4.6).
Background mediastinal blood pool activity 2.1.

Faintly accentuated left hilar activity with maximum SUV of 3.0.

Paraseptal emphysema. Postoperative findings from right upper
lobectomy. Mild airway thickening. No new lung nodule is identified.

Coronary, aortic arch, and branch vessel atherosclerotic vascular
disease.

ABDOMEN/PELVIS

No abnormal hypermetabolic activity within the liver, pancreas,
adrenal glands, or spleen. No hypermetabolic lymph nodes in the
abdomen or pelvis.

Aortoiliac atherosclerotic vascular disease. Bilateral photopenic
renal cysts. Prominent stool throughout the colon favors
constipation. Nondistended urinary bladder.

Diffuse accentuated activity in the proximal 75% of the stomach
associated with mild wall thickening but also nondistention.
Gastritis is not entirely excluded.

SKELETON

No focal hypermetabolic activity to suggest skeletal metastasis.
IMPRESSION: 1. Low-grade activity in a mildly enlarged right lower paratracheal
lymph node with maximum SUV of 4.1 (formerly 4.6).
2. Low-grade activity in indistinct left hilar lymph node, maximum
SUV 3.0.
3. At the site of the prior superior segment left lower lobe
pulmonary nodule, there is evidence of radiation pneumonitis with
low level activity characteristic of radiation fibrosis/pneumonitis,
but no focal nodular residual/recurrent hypermetabolic activity.
Surveillance of the region is likely warranted.
4. Diffuse accentuated activity in the proximal 75% of the stomach
with mild wall thickening but also nondistention. Appearance may
reflect gastritis or physiologic activity.
5. Other imaging findings of potential clinical significance: Small
8 mucous retention cyst in the left maxillary sinus. Coronary
atherosclerosis. Aortic Atherosclerosis (2UVLB-C4W.W) and Emphysema
(2UVLB-RKG.9). Bilateral photopenic renal cysts. Prominent stool
throughout the colon favors constipation. Prior right upper
lobectomy. Airway thickening is present, suggesting bronchitis or
reactive airways disease.

## 2018-12-09 ENCOUNTER — Ambulatory Visit (INDEPENDENT_AMBULATORY_CARE_PROVIDER_SITE_OTHER): Payer: PPO | Admitting: Podiatry

## 2018-12-09 ENCOUNTER — Encounter: Payer: Self-pay | Admitting: Podiatry

## 2018-12-09 DIAGNOSIS — B351 Tinea unguium: Secondary | ICD-10-CM

## 2018-12-09 DIAGNOSIS — M79676 Pain in unspecified toe(s): Secondary | ICD-10-CM

## 2018-12-09 DIAGNOSIS — E1151 Type 2 diabetes mellitus with diabetic peripheral angiopathy without gangrene: Secondary | ICD-10-CM | POA: Diagnosis not present

## 2018-12-09 DIAGNOSIS — M2010 Hallux valgus (acquired), unspecified foot: Secondary | ICD-10-CM

## 2018-12-09 NOTE — Progress Notes (Signed)
Complaint:  Visit Type: Patient returns to my office for continued preventative foot care services. Complaint: Patient states" my nails have grown long and thick and become painful to walk and wear shoes" Patient has been diagnosed with DM with no foot complications. The patient presents for preventative foot care services. No changes to ROS.  Patient has history of throat cancer.  Podiatric Exam: Vascular: dorsalis pedis and posterior tibial pulses are diminished but  palpable bilateral. Capillary return is immediate. Temperature gradient is WNL. Skin turgor WNL  Sensorium: Normal Semmes Weinstein monofilament test. Normal tactile sensation bilaterally. Nail Exam: Pt has thick disfigured discolored nails with subungual debris noted bilateral entire nail hallux through fifth toenails Ulcer Exam: There is no evidence of ulcer or pre-ulcerative changes or infection. Orthopedic Exam: Muscle tone and strength are WNL. No limitations in general ROM. No crepitus or effusions noted. Foot type and digits show no abnormalities. HAV  B/L.  Disfigured great mtoes n B/L. Skin: No Porokeratosis. No infection or ulcers  Diagnosis:  Onychomycosis, , Pain in right toe, pain in left toes  Treatment & Plan Procedures and Treatment: Consent by patient was obtained for treatment procedures.   Debridement of mycotic and hypertrophic toenails, 1 through 5 bilateral and clearing of subungual debris. No ulceration, no infection noted. Return Visit-Office Procedure: Patient instructed to return to the office for a follow up visit 3 months for continued evaluation and treatment.    Gardiner Barefoot DPM

## 2018-12-19 ENCOUNTER — Telehealth: Payer: Self-pay | Admitting: *Deleted

## 2018-12-19 NOTE — Telephone Encounter (Signed)
CALLED PATIENT TO INFORM OF LAB AND CT AND FU, NO ANSWER UNABLE TO LEAVE MESSAGE

## 2018-12-24 ENCOUNTER — Ambulatory Visit
Admission: RE | Admit: 2018-12-24 | Discharge: 2018-12-24 | Disposition: A | Discharge status: Home / Self Care | Payer: PPO | Source: Ambulatory Visit | Attending: Urology | Admitting: Urology

## 2018-12-24 ENCOUNTER — Ambulatory Visit (HOSPITAL_COMMUNITY)
Admission: RE | Admit: 2018-12-24 | Discharge: 2018-12-24 | Disposition: A | Discharge status: Home / Self Care | Payer: PPO | Source: Ambulatory Visit | Attending: Urology | Admitting: Urology

## 2018-12-24 DIAGNOSIS — C3432 Malignant neoplasm of lower lobe, left bronchus or lung: Secondary | ICD-10-CM | POA: Diagnosis not present

## 2018-12-24 DIAGNOSIS — C3411 Malignant neoplasm of upper lobe, right bronchus or lung: Secondary | ICD-10-CM

## 2018-12-24 DIAGNOSIS — C320 Malignant neoplasm of glottis: Secondary | ICD-10-CM | POA: Diagnosis not present

## 2018-12-24 LAB — BASIC METABOLIC PANEL - CANCER CENTER ONLY
Anion gap: 9 (ref 5–15)
BUN: 30 mg/dL — ABNORMAL HIGH (ref 8–23)
CALCIUM: 9.1 mg/dL (ref 8.9–10.3)
CO2: 28 mmol/L (ref 22–32)
CREATININE: 1.57 mg/dL — AB (ref 0.61–1.24)
Chloride: 103 mmol/L (ref 98–111)
GFR, EST AFRICAN AMERICAN: 52 mL/min — AB (ref >60)
GFR, Estimated: 45 mL/min — ABNORMAL LOW (ref >60)
Glucose, Bld: 71 mg/dL (ref 70–99)
Potassium: 4.6 mmol/L (ref 3.5–5.1)
Sodium: 140 mmol/L (ref 135–145)

## 2018-12-24 LAB — TSH: TSH: 3.168 u[IU]/mL (ref 0.320–4.118)

## 2018-12-24 MED ORDER — IOHEXOL 300 MG/ML  SOLN
75.0000 mL | Freq: Once | INTRAMUSCULAR | Status: AC | PRN
  Administered 2018-12-24: 75 mL via INTRAVENOUS

## 2018-12-24 MED ORDER — SODIUM CHLORIDE (PF) 0.9 % IJ SOLN
INTRAMUSCULAR | Status: AC
  Filled 2018-12-24: qty 50

## 2018-12-25 ENCOUNTER — Telehealth: Payer: Self-pay | Admitting: Urology

## 2018-12-25 ENCOUNTER — Ambulatory Visit: Payer: PPO | Attending: Urology | Admitting: Urology

## 2018-12-25 DIAGNOSIS — R911 Solitary pulmonary nodule: Secondary | ICD-10-CM

## 2018-12-25 DIAGNOSIS — C3432 Malignant neoplasm of lower lobe, left bronchus or lung: Secondary | ICD-10-CM

## 2018-12-25 NOTE — Telephone Encounter (Signed)
I spoke with the patient and his wife, Stasia Cavalier via speaker phone, to inform of findings on recent restaging CT Chest scan from 12/24/18 which shows interval growth of previously visualized clustered anterior left upper lobe pulmonary nodules, now coalesced into a single 2.7 cm irregular, spiculated solid pulmonary nodule which is highly suspicious for malignancy, either a metachronous primary bronchogenic carcinoma or metastatic disease.  There were no additional new or progressive findings in the chest.  We discussed the need for further evaluation with a PET scan and they agree to proceed.  We will get this scheduled in the next 1 to 2 weeks and plan a follow-up visit thereafter to review results and recommendations.  They appear to have a good understanding of these recommendations and are in agreement with the stated plan.  Nicholos Johns, MMS, PA-C [facility]: [phone]  Fax: [phone]

## 2018-12-26 ENCOUNTER — Telehealth: Payer: Self-pay | Admitting: *Deleted

## 2018-12-26 NOTE — Telephone Encounter (Signed)
CALLED PATIENT TO INFORM OF PET FOR 01-06-19 - ARRIVAL TIME - 2:30 PM @ [facility], PT. TO BE NPO-6 HRS. PRIOR TO TEST AND PT. TO HAVE NO CARBS DAY BEFORE TEST, PT. TO FOLLOW-UP WITH ASHLYN AND DR. MANNING ON 01-13-19, SPOKE WITH PATIENT'S WIFE- MARYLOU AND SHE IS AWARE OF THESE APPTS.

## 2019-01-06 ENCOUNTER — Ambulatory Visit (HOSPITAL_COMMUNITY): Payer: PPO

## 2019-01-08 ENCOUNTER — Encounter (HOSPITAL_COMMUNITY)
Admission: RE | Admit: 2019-01-08 | Discharge: 2019-01-08 | Disposition: A | Discharge status: Home / Self Care | Payer: PPO | Source: Ambulatory Visit | Attending: Urology | Admitting: Urology

## 2019-01-08 DIAGNOSIS — C3432 Malignant neoplasm of lower lobe, left bronchus or lung: Secondary | ICD-10-CM | POA: Diagnosis not present

## 2019-01-08 DIAGNOSIS — C4492 Squamous cell carcinoma of skin, unspecified: Secondary | ICD-10-CM | POA: Diagnosis not present

## 2019-01-08 DIAGNOSIS — R911 Solitary pulmonary nodule: Secondary | ICD-10-CM | POA: Diagnosis not present

## 2019-01-08 LAB — GLUCOSE, CAPILLARY: Glucose-Capillary: 102 mg/dL — ABNORMAL HIGH (ref 70–99)

## 2019-01-08 MED ORDER — FLUDEOXYGLUCOSE F - 18 (FDG) INJECTION
8.5000 | Freq: Once | INTRAVENOUS | Status: AC | PRN
  Administered 2019-01-08: 8.5 via INTRAVENOUS

## 2019-01-13 ENCOUNTER — Ambulatory Visit
Admission: RE | Admit: 2019-01-13 | Discharge: 2019-01-13 | Disposition: A | Discharge status: Home / Self Care | Payer: PPO | Source: Ambulatory Visit | Attending: Urology | Admitting: Urology

## 2019-01-13 ENCOUNTER — Encounter: Payer: Self-pay | Admitting: Urology

## 2019-01-13 ENCOUNTER — Other Ambulatory Visit: Payer: Self-pay

## 2019-01-13 VITALS — BP 131/47 | HR 71 | Temp 97.7°F | Resp 18 | Ht 71.0 in | Wt 176.5 lb

## 2019-01-13 DIAGNOSIS — Z923 Personal history of irradiation: Secondary | ICD-10-CM | POA: Diagnosis not present

## 2019-01-13 DIAGNOSIS — Z79899 Other long term (current) drug therapy: Secondary | ICD-10-CM | POA: Insufficient documentation

## 2019-01-13 DIAGNOSIS — C3412 Malignant neoplasm of upper lobe, left bronchus or lung: Secondary | ICD-10-CM

## 2019-01-13 DIAGNOSIS — C3432 Malignant neoplasm of lower lobe, left bronchus or lung: Secondary | ICD-10-CM | POA: Diagnosis not present

## 2019-01-13 DIAGNOSIS — Z85118 Personal history of other malignant neoplasm of bronchus and lung: Secondary | ICD-10-CM | POA: Diagnosis not present

## 2019-01-13 DIAGNOSIS — R918 Other nonspecific abnormal finding of lung field: Secondary | ICD-10-CM | POA: Insufficient documentation

## 2019-01-13 DIAGNOSIS — C3411 Malignant neoplasm of upper lobe, right bronchus or lung: Secondary | ICD-10-CM

## 2019-01-13 DIAGNOSIS — D491 Neoplasm of unspecified behavior of respiratory system: Secondary | ICD-10-CM | POA: Diagnosis not present

## 2019-01-13 DIAGNOSIS — Z8521 Personal history of malignant neoplasm of larynx: Secondary | ICD-10-CM | POA: Insufficient documentation

## 2019-01-13 NOTE — Progress Notes (Signed)
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 09/17/2016 - 09/24/2016 SBRT Treatment:  The LLL target was treated to 54Gy in 50fractions of 18Gy  Narrative:  In summary, this is a pleasant gentleman with a history of Stage IA NSCLC, squamous cell of the left lower lobe of the lung who completed SBRT to the LLL lesion in November 2017. He also has a history of Stage I, NSCLC, squamous cell of the right upper lobe, which was resected in 2013 with Dr. [name]. He has continued to be followed in surveillance since completing SBRT in 2017 and has had several mediastinal nodes that have been stable at 9-10 mm. His follow up Chest CT on 05/06/17 revealed no new concerning lung lesions, and post radiotherapy change in the LLL. A PET scan on 05/31/17 showed low-grade activity in a mildly enlarged right lower paratracheal lymph node with max SUV of 4.1 (formerly 4.6). There was also low-grade activity in indistinct left hilar lymph node, max SUV of 3.0. 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  Additionally, there was diffuse accentuated activity in the proximal 75% of the stomach associated with mild wall thickening but also nondistention.  This was further evaluated with endoscopy by  Dr. Watt Climes and no concerning findings were noted. He underwent repeat microlaryngoscopy with CO2 laser stripping of the right true and false vocal cords on 07/19/17 with Dr. Redmond Baseman. Biopsy of the right false vocal cord showed mild squamous dysplasia. Biopsy of the right true vocal cord showed invasive squamous cell carcinoma.  This was treated with radiotherapy which was completed between 08/26/2017 - 10/04/2017 and well tolerated.   On follow up PET scan from 01/07/2018, there were no typical findings to suggest metastatic head/neck cancer, disease stability in the chest with similar low-level hypermetabolism within a borderline enlarged right paratracheal node and presumed evolving radiation change in the superior segment of the left lower lobe.  No new lung lesions were noted.  Recent TSH was normal at 1.727 on 01/07/18.  Interval History: He returns today for follow-up.  His most recent CT Chest scan from 12/24/2018 showed interval growth of previously visualized clustered anterior left upper lobe pulmonary nodules, now coalesced into a single 2.7 cm irregular spiculated solid pulmonary nodule, highly suspicious for malignancy, either a metachronous primary bronchogenic carcinoma or metastatic disease. There were stable post-radiation changes in the LLL without evidence of local tumor recurrence, no additional new or progressive findings in the chest and stable nonspecific mild right paratracheal adenopathy as well as stable scattered small pulmonary nodules in the right lung.   He subsequently underwent a PET scan on 01/08/2019 for further evaluation of the LUL lesion which showed the 2.5 x 1.3 cm left upper lobe pulmonary nodule to be hypermetabolic with  maximum SUV of 11.5, compatible with active malignancy. There was no malignant appearing lymphadenopathy and no findings to suggest active malignancy in the neck.   The patient returns today for discussion of potential radiation treatment options to the left  upper lobe nodule.  In regards to his laryngeal carcinoma, he continues to alternate his follow up care with ENT Dr. Redmond Baseman and was most recently seen 08/21/2018. He underwent an in-office fiberoptic nasolaryngoscopy at that time which was normal by written report from Dr. Iona Beard office. He states that he will undergo repeat nasolaryngoscopy with Dr. Redmond Baseman at the time of his next scheduled visit on April 6th.  On review of systems, the patient reports persistent mucus in his throat which is irritating and causing him to cough harshly in the mornings to clear it out. He denies hemoptysis, increased SOB with exertion, chest pain, fever, chills or night sweats.  He reports a healthy appetite and is maintaining his weight.  He does have occasional difficulty swallowing with some solids but is supplementing his diet with Ensure/boost.  ALLERGIES:  is allergic to actos [pioglitazone]; avandia [rosiglitazone]; and dilaudid [hydromorphone hcl].  Meds: Current Outpatient Medications  Medication Sig Dispense Refill  . acetaminophen (TYLENOL) 500 MG tablet Take 1,000 mg by mouth every 6 (six) hours as needed for moderate pain.    Marland Kitchen albuterol (PROVENTIL HFA;VENTOLIN HFA) 108 (90 Base) MCG/ACT inhaler Inhale 2 puffs into the lungs every 6 (six) hours as needed for wheezing or shortness of breath. 1 Inhaler 0  . amoxicillin-clavulanate (AUGMENTIN) 875-125 MG tablet Take 1 tablet by mouth 2 (two) times daily. 20 tablet 0  . atorvastatin (LIPITOR) 40 MG tablet Take 40 mg by mouth daily.    Marland Kitchen diltiazem (CARDIZEM CD) 120 MG 24 hr capsule Take 1 capsule (120 mg total) by mouth daily. 90 capsule 3  . Docosanol (ABREVA EX) Apply 1 application topically daily as needed (cold sores).    Marland Kitchen FLUoxetine (PROZAC) 40 MG capsule Take 1 capsule (40 mg total) by mouth daily. 90 capsule 3  . fluticasone (FLONASE) 50 MCG/ACT nasal spray Place 2 sprays into both nostrils as needed.   5  . gabapentin (NEURONTIN) 300 MG capsule TAKE  1 CAPSULE BY MOUTH THREE TIMES DAILY 270 capsule 0  . ipratropium-albuterol (DUONEB) 0.5-2.5 (3) MG/3ML SOLN USE 1 VIAL IN NEBULIZER 4 TIMES A DAY 360 mL 5  . LANTUS 100 UNIT/ML injection Inject 4 Units into the skin every morning.   3  . losartan (COZAAR) 25 MG tablet Take 25 mg by mouth daily.    . Multiple Vitamin (MULTIVITAMIN WITH MINERALS) TABS Take 1 tablet by mouth daily.    . nitroGLYCERIN (NITROSTAT) 0.4 MG SL tablet Place 0.4 mg under the tongue every 5 (five) minutes as needed for chest pain.    . ONE TOUCH ULTRA TEST test strip     . ranitidine (ZANTAC) 150 MG capsule Take 1 capsule (150 mg total) by mouth 2 (two) times daily. (Patient taking differently: Take 150 mg by mouth daily. ) 60 capsule 11  . Tiotropium Bromide Monohydrate (SPIRIVA RESPIMAT) 2.5 MCG/ACT AERS Inhale 2 puffs into the lungs daily. 2 Inhaler 0  . traZODone (DESYREL) 100 MG tablet TAKE ONE TABLET BY MOUTH EVERY NIGHT AT BEDTIME 90 tablet 0   Current Facility-Administered Medications  Medication Dose Route Frequency Provider Last Rate Last Dose  . albuterol (PROVENTIL) (2.5 MG/3ML) 0.083% nebulizer solution 2.5 mg  2.5 mg Nebulization Once Delsa Grana, PA-C  Physical Findings:  height is 5\' 11"  (1.803 m) and weight is 176 lb 8 oz (80.1 kg). His oral temperature is 97.7 F (36.5 C). His blood pressure is 131/47 (abnormal) and his pulse is 71. His respiration is 18 and oxygen saturation is 98%.  Pain Assessment Pain Score: 2 /10  In general this is a well appearing caucasian male in no acute distress. He is alert and oriented x4 and appropriate throughout the examination. HEENT reveals that the patient is normocephalic, atraumatic. EOMs are intact. PERRLA. Skin is intact without any evidence of gross lesions. Cardiovascular exam reveals a regular rate and rhythm, no clicks rubs or murmurs are auscultated. Chest is clear to auscultation bilaterally with mild wheezing at the lung bases. No palpable  lymphadenopathy in the cervical, axillary or supraclavicular chains. No pitting edema, clubbing or cyanosis in the LEs.   Lab Findings: Lab Results  Component Value Date   WBC 6.6 11/10/2018   HGB 13.9 11/10/2018   HCT 41.1 11/10/2018   MCV 94.9 11/10/2018   PLT 148 11/10/2018     Radiographic Findings: Ct Chest W Contrast  Result Date: 12/25/2018 CLINICAL DATA:  Right upper lobectomy for squamous cell lung carcinoma 03/17/2012. stage I left lower lobe squamous cell lung cancer diagnosed September 2017 status post radiation therapy. Additional history of right vocal cord squamous cell carcinoma treated with radiation therapy. EXAM: CT CHEST WITH CONTRAST TECHNIQUE: Multidetector CT imaging of the chest was performed during intravenous contrast administration. CONTRAST:  55mL OMNIPAQUE IOHEXOL 300 MG/ML  SOLN COMPARISON:  06/24/2018 chest CT.  01/07/2018 PET-CT. FINDINGS: Cardiovascular: Normal heart size. No significant pericardial effusion/thickening. Three-vessel coronary atherosclerosis. Atherosclerotic nonaneurysmal thoracic aorta. Normal caliber pulmonary arteries. No central pulmonary emboli. Mediastinum/Nodes: No discrete thyroid nodules. Unremarkable esophagus. No axillary adenopathy. Mildly enlarged 1.1 cm right paratracheal node (series 2/image 55), stable using similar measurement technique. No new pathologically enlarged mediastinal nodes. No hilar adenopathy. Lungs/Pleura: No pneumothorax. No pleural effusion. Moderate centrilobular and paraseptal emphysema. Status post right upper lobectomy. Interval growth of previously described clustered anterior left upper lobe pulmonary nodules, now coalesced into a single 2.7 x 1.2 cm irregular spiculated solid pulmonary nodule (series 5/image 32). Stable bandlike consolidation in the superior segment left lower lobe with internal radiation fiducial marker and associated volume loss and distortion, compatible with radiation fibrosis. Additional  scattered small solid pulmonary nodules in the right lung, largest 5 mm in the right middle lobe (series 5/image 34), all stable. No new significant pulmonary nodules. No acute consolidative airspace disease. Upper abdomen: Simple upper renal cysts bilaterally, largest 3.1 cm on the right. Musculoskeletal: No aggressive appearing focal osseous lesions. Symmetric stable mild gynecomastia. Mild thoracic spondylosis. IMPRESSION: 1. Interval growth of previously visualized clustered anterior left upper lobe pulmonary nodules, now coalesced into a single 2.7 cm irregular spiculated solid pulmonary nodule, highly suspicious for malignancy, either a metachronous primary bronchogenic carcinoma or metastatic disease. 2. No additional new or progressive findings in the chest. Stable nonspecific mild right paratracheal adenopathy. Stable scattered small pulmonary nodules in the right lung. 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 3. Three-vessel coronary atherosclerosis. Aortic Atherosclerosis (ICD10-I70.0) and Emphysema (ICD10-J43.9). Electronically Signed   By: Ilona Sorrel M.D.   On: 12/25/2018 09:16   Nm Pet Image Restag (ps) Skull Base To Thigh  Result Date: 01/08/2019 CLINICAL DATA:  Subsequent treatment strategy for squamous cell cancer. Prior right upper lobectomy and prior radiation therapy. Prior right vocal  cord squamous cell carcinoma, treated with laser excision and radiation therapy. Possible disease progression in the left upper lobe as shown on recent CT scan. EXAM: NUCLEAR MEDICINE PET SKULL BASE TO THIGH TECHNIQUE: 8.5 mCi F-18 FDG was injected intravenously. Full-ring PET imaging was performed from the skull base to thigh after the radiotracer. CT data was obtained and used for attenuation correction and anatomic localization. Fasting blood glucose: 102 mg/dl COMPARISON:  Multiple exams, including 01/07/2018, and CT chest from 12/24/2018  FINDINGS: Mediastinal blood pool activity: SUV max 1.9 NECK: Diffusely mildly accentuated palatine activity, current maximum SUV 7.4, previously 6.3. This is most likely physiologic given the symmetry. No current accentuated metabolic activity in the glottis. No hypermetabolic adenopathy in the neck. Incidental CT findings: Mucous retention cyst in the left maxillary sinus. CHEST: The left upper lobe nodule measures 2.5 by 1.3 cm on images 63-64 series 4, and has a maximum SUV of 11.5, compatible with malignancy. A right lower paratracheal lymph node measuring 1.1 cm in short axis on image 73/4 has a maximum SUV of 3.2, formerly 3.8. Left lower lobe peripheral airspace opacity with adjacent fiducial similar to the 12/24/2018 exam and roughly similar to the prior PET-CT, maximum SUV 3.0 (stable from 01/07/2018). Some of the accentuated activity could be from radiation pneumonitis/fibrosis. Incidental CT findings: Volume loss in the right hemithorax. Emphysema. Coronary, aortic arch, and branch vessel atherosclerotic vascular disease. Gynecomastia. Calcification of the mitral valve. ABDOMEN/PELVIS: Nondistention in wall thickening in the proximal stomach with diffuse accentuated activity in most of the stomach, typical regional maximal SUV 7.9 Incidental CT findings: Bilateral renal cysts. Aortic Atherosclerosis (ICD10-I70.0). 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 Borderline prominence of stool in the colon. SKELETON: No significant abnormal hypermetabolic activity in this region. Incidental CT findings: Right sixth rib nonunited osteotomy. IMPRESSION: 1. The 2.5 by 1.3 cm left upper lobe pulmonary nodule has maximum SUV of 11.5, compatible with active malignancy. 2. Stable appearance of peripheral airspace opacity and low-grade activity in the superior segment left lower lobe, compatible with previously treated lesion and likely some degree of radiation therapy related findings. No change or  progression in this location. 3. Mildly enlarged right lower paratracheal lymph node, but stable in size and reduced in activity compared to 01/07/2018. 4. Diffuse wall thickening of the stomach with accentuated activity. Some of this may be physiologic but gastritis or gastric infiltration is not readily excluded. Correlate with any symptoms or other indicators in deciding whether upper endoscopy is warranted. 5. No findings of active malignancy in the neck. 6. 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 Ectatic abdominal aorta at risk for aneurysm development. Recommend followup by ultrasound in 5 years. This recommendation follows ACR consensus guidelines: White Paper of the ACR Incidental Findings Committee II on Vascular Findings. J Am Coll Radiol 2013; 10:789-794. 7. Other imaging findings of potential clinical significance: Mucous retention cyst of the left maxillary sinus. Aortic Atherosclerosis (ICD10-I70.0) and Emphysema (ICD10-J43.9). Coronary atherosclerosis. Gynecomastia. Mitral valve calcification. Bilateral renal cysts. Electronically Signed   By: Van Clines M.D.   On: 01/08/2019 09:33    Impression/Plan: 1. Putative Stage IB NSCLC in the LUL.  Today, we talked to the patient and his wife about the findings and workup thus far. We discussed the natural history of non-small cell lung carcinoma and general treatment, highlighting the role of radiotherapy in the management. We discussed the available radiation techniques, and focused on the details of logistics and delivery. We reviewed  the anticipated acute and late sequelae associated with radiation in this setting. The patient was encouraged to ask questions that were answered to his satisfaction.  At the conclusion of our conversation, the patient elects to proceed with SBRT to the left upper lobe lesion as recommended.  He freely signed written consent today in the office and a copy of this document was placed in the  chart, a copy was provided to the patient. The patient is scheduled for CT simulation on March 17th at 9:00 AM in anticipation of beginning a 3-5 fraction course of SBRT to the left upper lobe nodule early next week. 2. 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 Recent PET shows stable disease with no evidence of new or recurrent disease in the neck.  We will plan to alternate follow-up visits with Dr. Redmond Baseman every 3 months for the first 1-2 years and then every 6 months for year 2-3 and every 8 months for years 3-5 with annual follow up thereafter.  Per patient discussion with Dr. Redmond Baseman, for financial purposes, it is agreeable to have in office nasopharyngoscopy with Dr. Redmond Baseman every 6 months and will have indirect laryngoscopy here every 6 months, alternately.  3. Stage IA, T1aN0squamous cell carcinoma of the right upper lobe and left lower lobe of lung. His previously treated disease appears stable on his recent PET imaging so we will continue to monitor his disease with serial CT Chest scans every 6 months with follow up visits to discuss findings.  He knows to call with any questions or concerns in the interim.    Nicholos Johns, PA-C    Tyler Pita, MD  [facility] Direct Dial: [phone]  Fax: [phone] [email or web address]  Skype  [email or web address]  This document serves as a record of services personally performed by Tyler Pita, MD and Freeman Caldron, PA-C. It was created on their behalf by Rae Lips, a trained medical scribe. The creation of this record is based on the scribe's personal observations and the providers' statements to them. This document has been checked and approved by the attending providers.

## 2019-01-20 ENCOUNTER — Ambulatory Visit: Payer: PPO | Admitting: Radiation Oncology

## 2019-01-21 ENCOUNTER — Other Ambulatory Visit: Payer: Self-pay

## 2019-01-21 ENCOUNTER — Ambulatory Visit
Admission: RE | Admit: 2019-01-21 | Discharge: 2019-01-21 | Disposition: A | Discharge status: Home / Self Care | Payer: PPO | Source: Ambulatory Visit | Attending: Urology | Admitting: Urology

## 2019-01-21 DIAGNOSIS — C320 Malignant neoplasm of glottis: Secondary | ICD-10-CM | POA: Diagnosis not present

## 2019-01-21 DIAGNOSIS — C3412 Malignant neoplasm of upper lobe, left bronchus or lung: Secondary | ICD-10-CM

## 2019-01-23 ENCOUNTER — Other Ambulatory Visit: Payer: Self-pay | Admitting: Family Medicine

## 2019-01-23 MED ORDER — GABAPENTIN 300 MG PO CAPS: 300.0000 mg | ORAL_CAPSULE | Freq: Three times a day (TID) | ORAL | 2 refills | Status: DC

## 2019-01-25 NOTE — Progress Notes (Signed)
  Radiation Oncology         [phone] ________________________________  Name: [name] MRN: [id number]  Date: 01/21/2019  DOB: Jan 17, 1951  STEREOTACTIC BODY RADIOTHERAPY SIMULATION AND TREATMENT PLANNING NOTE    ICD-10-CM   1. Primary cancer of left upper lobe of lung (HCC) C34.12     DIAGNOSIS:  68 yo man with Stage IB NSCLC in the LUL  NARRATIVE:  The patient was brought to the [facility].  Identity was confirmed.  All relevant records and images related to the planned course of therapy were reviewed.  The patient freely provided informed written consent to proceed with treatment after reviewing the details related to the planned course of therapy. The consent form was witnessed and verified by the simulation staff.  Then, the patient was set-up in a stable reproducible  supine position for radiation therapy.  A BodyFix immobilization pillow was fabricated for reproducible positioning.  Then I personally applied the abdominal compression paddle to limit respiratory excursion.  4D respiratoy motion management CT images were obtained.  Surface markings were placed.  The CT images were loaded into the planning software.  Then, using Cine, MIP, and standard views, the internal target volume (ITV) and planning target volumes (PTV) were delinieated, and avoidance structures were contoured.  Treatment planning then occurred.  The radiation prescription was entered and confirmed.  A total of two complex treatment devices were fabricated in the form of the BodyFix immobilization pillow and a neck accuform cushion.  I have requested : 3D Simulation  I have requested a DVH of the following structures: Heart, Lungs, Esophagus, Chest Wall, Brachial Plexus, Major Blood Vessels, and targets.  SPECIAL TREATMENT PROCEDURE:  The planned course of therapy using radiation constitutes a special treatment procedure. Special care is required in the management of this patient for the  following reasons. This treatment constitutes a Special Treatment Procedure for the following reason: [ High dose per fraction requiring special monitoring for increased toxicities of treatment including daily imaging..  The special nature of the planned course of radiotherapy will require increased physician supervision and oversight to ensure patient's safety with optimal treatment outcomes.  RESPIRATORY MOTION MANAGEMENT SIMULATION:  In order to account for effect of respiratory motion on target structures and other organs in the planning and delivery of radiotherapy, this patient underwent respiratory motion management simulation.  To accomplish this, when the patient was brought to the CT simulation planning suite, 4D respiratoy motion management CT images were obtained.  The CT images were loaded into the planning software.  Then, using a variety of tools including Cine, MIP, and standard views, the target volume and planning target volumes (PTV) were delineated.  Avoidance structures were contoured.  Treatment planning then occurred.  Dose volume histograms were generated and reviewed for each of the requested structure.  The resulting plan was carefully reviewed and approved today.  PLAN:  The patient will receive 54 Gy in 3 fractions.  ________________________________  Sheral Apley Tammi Klippel, M.D.

## 2019-01-26 DIAGNOSIS — C320 Malignant neoplasm of glottis: Secondary | ICD-10-CM | POA: Diagnosis not present

## 2019-01-26 DIAGNOSIS — C3412 Malignant neoplasm of upper lobe, left bronchus or lung: Secondary | ICD-10-CM | POA: Diagnosis not present

## 2019-01-27 ENCOUNTER — Other Ambulatory Visit: Payer: Self-pay | Admitting: Family Medicine

## 2019-01-27 MED ORDER — TRAZODONE HCL 100 MG PO TABS: 100.0000 mg | ORAL_TABLET | Freq: Every day | ORAL | 1 refills | Status: DC

## 2019-02-01 NOTE — Addendum Note (Signed)
Encounter addended by: Tyler Pita, MD on: 02/01/2019 4:36 PM  Actions taken: Medication List reviewed, Problem List reviewed, Allergies reviewed, Visit diagnoses modified

## 2019-02-09 ENCOUNTER — Other Ambulatory Visit: Payer: Self-pay

## 2019-02-09 MED ORDER — FLUOXETINE HCL 40 MG PO CAPS: 40.0000 mg | ORAL_CAPSULE | Freq: Every day | ORAL | 0 refills | Status: DC

## 2019-02-09 NOTE — Telephone Encounter (Signed)
Requested Prescriptions   Pending Prescriptions Disp Refills  . FLUoxetine (PROZAC) 40 MG capsule 90 capsule 0    Sig: Take 1 capsule (40 mg total) by mouth daily.   Last OV 11/10/2018  Last refilled 10/29/2018

## 2019-02-10 ENCOUNTER — Ambulatory Visit
Admission: RE | Admit: 2019-02-10 | Discharge: 2019-02-10 | Disposition: A | Discharge status: Home / Self Care | Payer: PPO | Source: Ambulatory Visit | Attending: Urology | Admitting: Urology

## 2019-02-10 ENCOUNTER — Other Ambulatory Visit: Payer: Self-pay

## 2019-02-10 ENCOUNTER — Telehealth: Payer: Self-pay | Admitting: Radiation Oncology

## 2019-02-10 DIAGNOSIS — C320 Malignant neoplasm of glottis: Secondary | ICD-10-CM | POA: Diagnosis not present

## 2019-02-10 NOTE — Telephone Encounter (Signed)
-----   Message from [place] sent at 02/09/2019 11:06 AM EDT ----- Regarding: RE: Please phone wife Jobe Gibbon [facility] Sam,    I will be glad to call them.  If you get any of these in the future, will you have them call the billing department first at [phone].  They usually can investigate and fix these then reach out to me if needed.  Thanks,     Ailene Ravel ----- Message ----- From: Heywood Footman, RN Sent: 02/09/2019  10:40 AM EDT To: Oretha Milch Subject: Please phone wife Jobe Gibbon                     Ladies.   [place]ott phoned about the insurance statement she received reference her husband's January 2nd follow up. She reports a laryngoscopy charge is on the statement. I did not charge for a laryngoscopy because one wasn't done since his ENT had already done it. Would one of you reach out to her at [phone] please?  Sam

## 2019-02-10 NOTE — Telephone Encounter (Signed)
Wife, Jobe Gibbon, phoned again today. Phoned her back promptly. She reports she did not receive a return call yesterday. Apologized and voice understanding that Jodelle Green was going to call her. Provided her with billing department number of [phone] as directed by [facility]. Jobe Gibbon verbalized her intent to call them and expressed appreciation for the return call.

## 2019-02-11 ENCOUNTER — Ambulatory Visit: Payer: PPO

## 2019-02-12 ENCOUNTER — Other Ambulatory Visit: Payer: Self-pay

## 2019-02-12 ENCOUNTER — Ambulatory Visit
Admission: RE | Admit: 2019-02-12 | Discharge: 2019-02-12 | Disposition: A | Discharge status: Home / Self Care | Payer: PPO | Source: Ambulatory Visit | Attending: Radiation Oncology | Admitting: Radiation Oncology

## 2019-02-12 DIAGNOSIS — C320 Malignant neoplasm of glottis: Secondary | ICD-10-CM | POA: Diagnosis not present

## 2019-02-13 ENCOUNTER — Ambulatory Visit: Payer: PPO

## 2019-02-16 ENCOUNTER — Ambulatory Visit: Payer: PPO

## 2019-02-18 ENCOUNTER — Encounter: Payer: Self-pay | Admitting: Radiation Oncology

## 2019-02-18 ENCOUNTER — Ambulatory Visit
Admission: RE | Admit: 2019-02-18 | Discharge: 2019-02-18 | Disposition: A | Discharge status: Home / Self Care | Payer: PPO | Source: Ambulatory Visit | Attending: Radiation Oncology | Admitting: Radiation Oncology

## 2019-02-18 ENCOUNTER — Other Ambulatory Visit: Payer: Self-pay

## 2019-02-18 DIAGNOSIS — C320 Malignant neoplasm of glottis: Secondary | ICD-10-CM | POA: Diagnosis not present

## 2019-02-18 DIAGNOSIS — C3412 Malignant neoplasm of upper lobe, left bronchus or lung: Secondary | ICD-10-CM

## 2019-02-18 NOTE — Progress Notes (Signed)
I saw the patient after he finished his treatment with SBRT to the left upper lobe.  In summary this is a pleasant patient who has had previous SBRT for a stage I cancer in the Left lower lobe, and was treated to the larynx for a right vocal cord cancer also stage I in 2018.  It is been quite a while since I have seen him but I had the pleasure of checking back in with him today.  He feels as though he is doing well and denies any shortness of breath since his treatment.  He continues to have hoarseness which predates his current treatment, and was originally found when he was diagnosed with a stage I vocal cord carcinoma.  This is remained stable, to mildly improved in his report.  He denies any esophageal discomfort, or pain with eating.  No other complaints are verbalized.  We will proceed with a CT scan in 6 weeks time, and we will follow-up with these results by phone.  Following this, he will continue with six-month surveillance.    Carola Rhine, PAC

## 2019-02-20 ENCOUNTER — Ambulatory Visit: Payer: PPO

## 2019-03-03 NOTE — Progress Notes (Signed)
  Radiation Oncology         [phone] ________________________________  Name: [name] MRN: [id number]  Date: 02/18/2019  DOB: [birth year]  End of Treatment Note  Diagnosis:   68 y.o. 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  Volumetric arc fields were employed to deliver 6 MV X-rays.  Image guidance was performed with per fraction cone beam CT prior to treatment under personal MD supervision.  Immobilization was achieved using BodyFix Pillow.  Narrative: The patient tolerated radiation treatment relatively well.  He did experience mild dysphagia, as well as mild worsening hoarseness but denied needing any medication to manage his throat discomfort/dysphagia. He denied any skin changes within treatment field, hemoptysis or significant fatigue. He did note increased frequency of productive cough with white sputum only, and no fever, chills or night sweats.   Plan: The patient has completed radiation treatment. The patient will return to radiation oncology clinic for routine followup in one month. I advised them to call or return sooner if they have any questions or concerns related to their recovery or treatment. ________________________________  Sheral Apley. Tammi Klippel, M.D.  This document serves as a record of services personally performed by Tyler Pita, MD. It was created on his behalf by Rae Lips, a trained medical scribe. The creation of this record is based on the scribe's personal observations and the provider's statements to them. This document has been checked and approved by the attending provider.

## 2019-03-06 ENCOUNTER — Telehealth: Payer: Self-pay | Admitting: Radiation Oncology

## 2019-03-06 NOTE — Telephone Encounter (Signed)
Received call from patient's wife concerned about a medical bill from this office. Phoned back. [name] reports phoning [phone]. She explains she was unable to obtain resolution. She request [employer] number, again. Provided requested number. [name] verbalized understanding and expressed appreciation for the return call.

## 2019-03-10 ENCOUNTER — Ambulatory Visit: Payer: PPO | Admitting: Podiatry

## 2019-03-10 DIAGNOSIS — C320 Malignant neoplasm of glottis: Secondary | ICD-10-CM | POA: Diagnosis not present

## 2019-03-13 IMAGING — DX DG CHEST 2V
2 series · 2 of 2 positions shown · non-contrast
Comparison: Chest x-ray dated 08/24/2016.

CLINICAL DATA: Hx of esophageal cancer with radiation. SOB, had
right lung removed 2 years ago. Diabetic. Coughing phelm for two
weeks.

EXAM:
CHEST  2 VIEW

[chest pa]
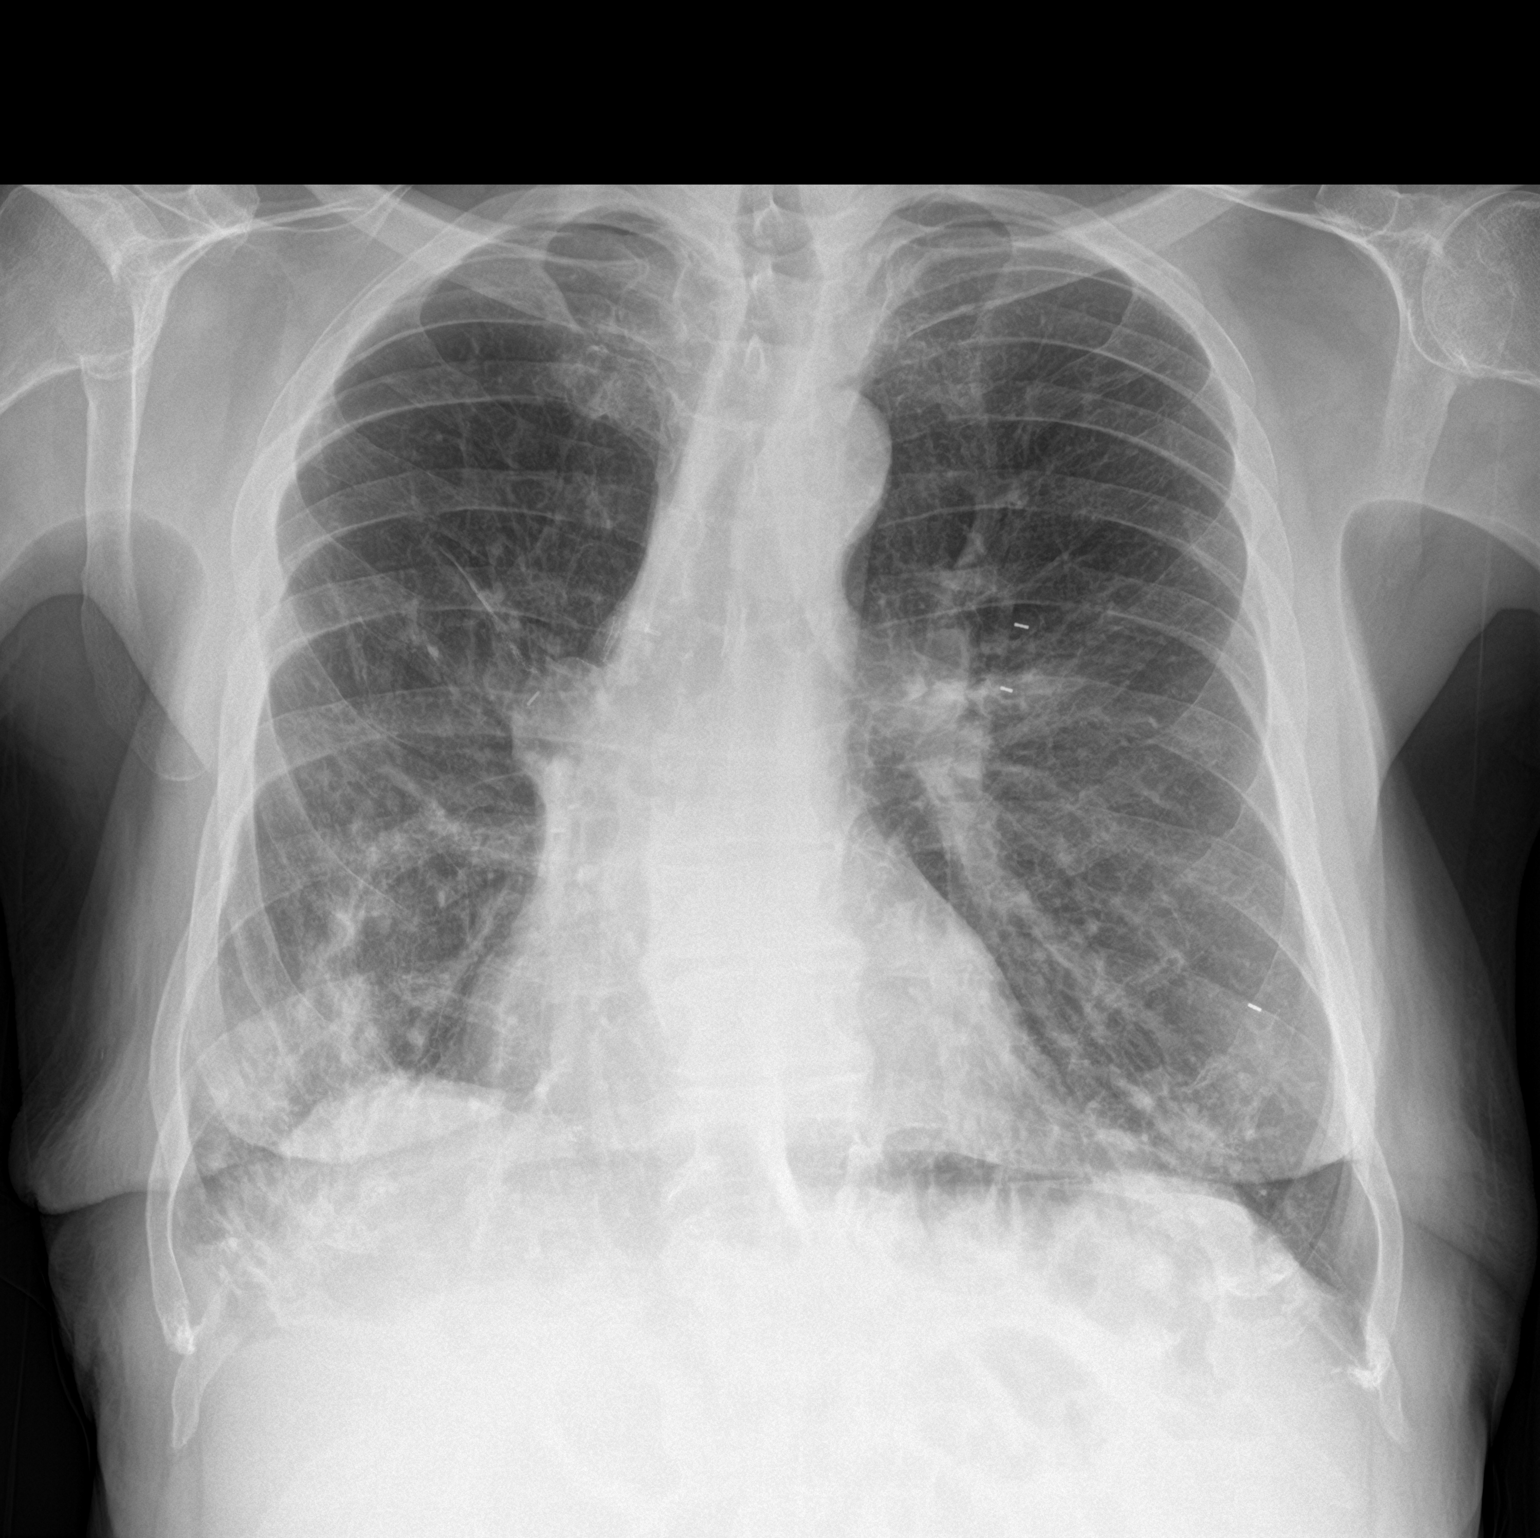

[chest lat]
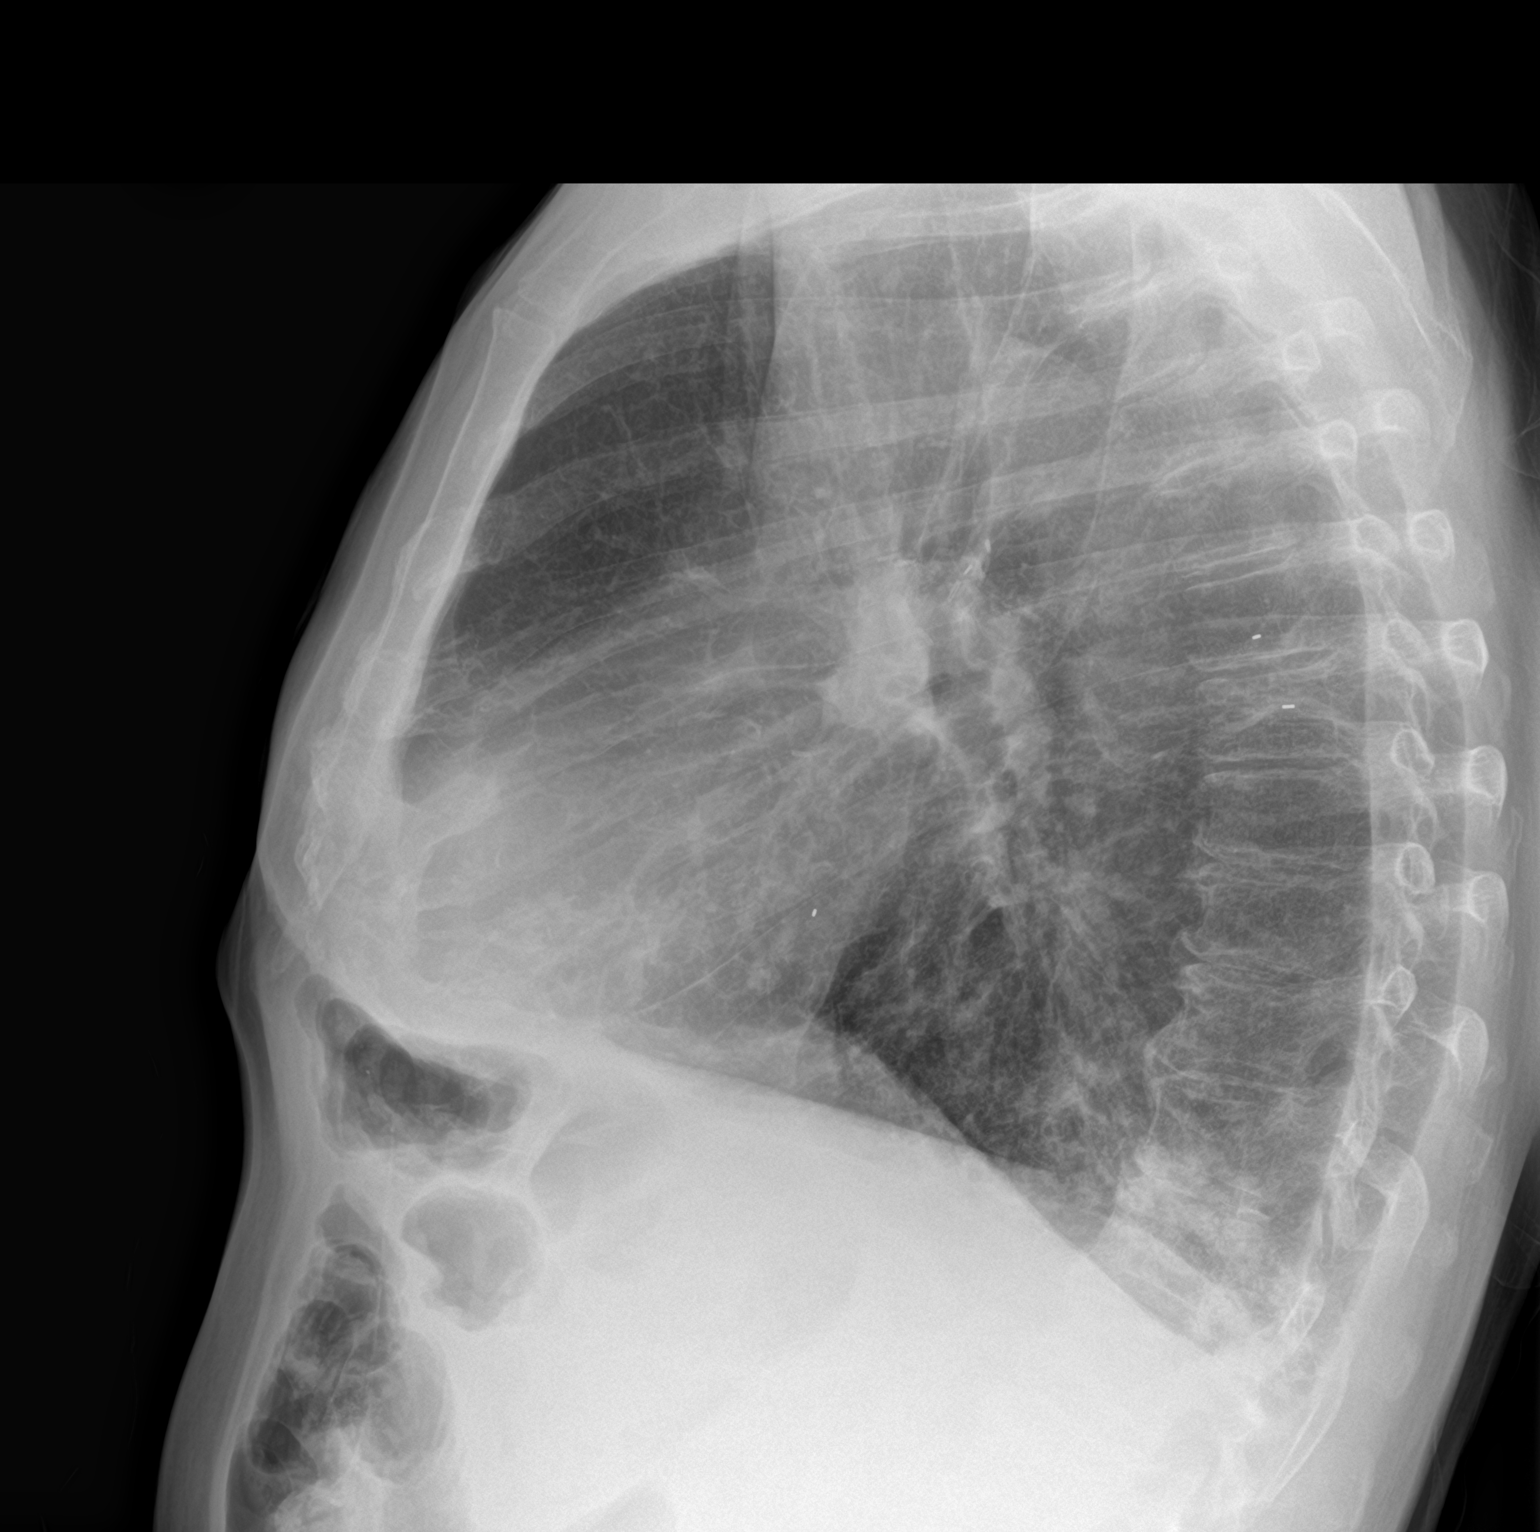

[2 of 2 positions shown; findings below may reference images not displayed]

FINDINGS: New ill-defined airspace opacities at each lung base, right slightly
greater than left. Status post partial right lung resection with
associated volume loss. No convincing pulmonary nodule or mass
identified within either lung. No pleural effusion or pneumothorax
seen.

Heart size is normal. Mediastinal contours are within normal limits.
Atherosclerotic changes noted at the aortic arch.

Mild degenerative change within the slightly kyphotic thoracic
spine. No acute or suspicious osseous finding.
IMPRESSION: 1. New ill-defined opacities airspace opacities at each lung base,
right greater than left, most likely pneumonia or aspiration.
2. Stable postsurgical changes.
3. Aortic atherosclerosis.

## 2019-04-08 ENCOUNTER — Encounter: Payer: Self-pay | Admitting: Podiatry

## 2019-04-08 ENCOUNTER — Other Ambulatory Visit: Payer: Self-pay

## 2019-04-08 ENCOUNTER — Ambulatory Visit: Payer: PPO | Admitting: Podiatry

## 2019-04-08 VITALS — Temp 97.7°F

## 2019-04-08 DIAGNOSIS — B351 Tinea unguium: Secondary | ICD-10-CM

## 2019-04-08 DIAGNOSIS — M79676 Pain in unspecified toe(s): Secondary | ICD-10-CM | POA: Diagnosis not present

## 2019-04-08 DIAGNOSIS — E1151 Type 2 diabetes mellitus with diabetic peripheral angiopathy without gangrene: Secondary | ICD-10-CM | POA: Diagnosis not present

## 2019-04-08 NOTE — Progress Notes (Signed)
Complaint:  Visit Type: Patient returns to my office for continued preventative foot care services. Complaint: Patient states" my nails have grown long and thick and become painful to walk and wear shoes" Patient has been diagnosed with DM with neuropathy.The patient presents for preventative foot care services. No changes to ROS.  Patient has history of throat cancer.  Podiatric Exam: Vascular: dorsalis pedis and posterior tibial pulses are diminished but  palpable bilateral. Capillary return is immediate. Temperature gradient is WNL. Skin turgor WNL  Sensorium: Diminished  Semmes Weinstein monofilament test. Normal tactile sensation bilaterally. Nail Exam: Pt has thick disfigured discolored nails with subungual debris noted bilateral entire nail hallux through fifth toenails Ulcer Exam: There is no evidence of ulcer or pre-ulcerative changes or infection. Orthopedic Exam: Muscle tone and strength are WNL. No limitations in general ROM. No crepitus or effusions noted. Foot type and digits show no abnormalities. HAV  B/L.  Disfigured great mtoes n B/L. Skin: No Porokeratosis. No infection or ulcers  Diagnosis:  Onychomycosis, , Pain in right toe, pain in left toes  Treatment & Plan Procedures and Treatment: Consent by patient was obtained for treatment procedures.   Debridement of mycotic and hypertrophic toenails, 1 through 5 bilateral and clearing of subungual debris. No ulceration, no infection noted. Return Visit-Office Procedure: Patient instructed to return to the office for a follow up visit 3 months for continued evaluation and treatment.    Gardiner Barefoot DPM

## 2019-04-13 ENCOUNTER — Other Ambulatory Visit: Payer: Self-pay | Admitting: Family Medicine

## 2019-04-13 DIAGNOSIS — J189 Pneumonia, unspecified organism: Secondary | ICD-10-CM

## 2019-04-13 DIAGNOSIS — R0602 Shortness of breath: Secondary | ICD-10-CM

## 2019-04-13 DIAGNOSIS — J441 Chronic obstructive pulmonary disease with (acute) exacerbation: Secondary | ICD-10-CM

## 2019-05-19 ENCOUNTER — Other Ambulatory Visit: Payer: Self-pay | Admitting: Family Medicine

## 2019-05-20 DIAGNOSIS — Z794 Long term (current) use of insulin: Secondary | ICD-10-CM | POA: Diagnosis not present

## 2019-05-20 DIAGNOSIS — E1142 Type 2 diabetes mellitus with diabetic polyneuropathy: Secondary | ICD-10-CM | POA: Diagnosis not present

## 2019-05-20 DIAGNOSIS — E1165 Type 2 diabetes mellitus with hyperglycemia: Secondary | ICD-10-CM | POA: Diagnosis not present

## 2019-05-20 DIAGNOSIS — N183 Chronic kidney disease, stage 3 (moderate): Secondary | ICD-10-CM | POA: Diagnosis not present

## 2019-06-10 DIAGNOSIS — R490 Dysphonia: Secondary | ICD-10-CM | POA: Diagnosis not present

## 2019-06-10 DIAGNOSIS — Z923 Personal history of irradiation: Secondary | ICD-10-CM | POA: Diagnosis not present

## 2019-06-10 DIAGNOSIS — Z972 Presence of dental prosthetic device (complete) (partial): Secondary | ICD-10-CM | POA: Diagnosis not present

## 2019-06-10 DIAGNOSIS — Z9089 Acquired absence of other organs: Secondary | ICD-10-CM | POA: Diagnosis not present

## 2019-06-10 DIAGNOSIS — Z8521 Personal history of malignant neoplasm of larynx: Secondary | ICD-10-CM | POA: Diagnosis not present

## 2019-07-06 ENCOUNTER — Other Ambulatory Visit: Payer: Self-pay | Admitting: Radiation Oncology

## 2019-07-06 DIAGNOSIS — C3412 Malignant neoplasm of upper lobe, left bronchus or lung: Secondary | ICD-10-CM

## 2019-07-07 ENCOUNTER — Encounter: Payer: Self-pay | Admitting: Family Medicine

## 2019-07-07 LAB — HM DIABETES EYE EXAM

## 2019-07-15 ENCOUNTER — Ambulatory Visit: Payer: PPO | Admitting: Podiatry

## 2019-07-15 ENCOUNTER — Encounter: Payer: Self-pay | Admitting: Podiatry

## 2019-07-15 ENCOUNTER — Other Ambulatory Visit: Payer: Self-pay

## 2019-07-15 DIAGNOSIS — B351 Tinea unguium: Secondary | ICD-10-CM

## 2019-07-15 DIAGNOSIS — M79676 Pain in unspecified toe(s): Secondary | ICD-10-CM

## 2019-07-15 DIAGNOSIS — E1151 Type 2 diabetes mellitus with diabetic peripheral angiopathy without gangrene: Secondary | ICD-10-CM | POA: Diagnosis not present

## 2019-07-15 NOTE — Progress Notes (Signed)
Complaint:  Visit Type: Patient returns to my office for continued preventative foot care services. Complaint: Patient states" my nails have grown long and thick and become painful to walk and wear shoes" Patient has been diagnosed with DM with neuropathy.The patient presents for preventative foot care services. No changes to ROS.  Patient has history of throat cancer.  Podiatric Exam: Vascular: dorsalis pedis and posterior tibial pulses are diminished but  palpable bilateral. Capillary return is immediate. Temperature gradient is WNL. Skin turgor WNL  Sensorium: Diminished  Semmes Weinstein monofilament test. Normal tactile sensation bilaterally. Nail Exam: Pt has thick disfigured discolored nails with subungual debris noted bilateral entire nail hallux through fifth toenails Ulcer Exam: There is no evidence of ulcer or pre-ulcerative changes or infection. Orthopedic Exam: Muscle tone and strength are WNL. No limitations in general ROM. No crepitus or effusions noted. Foot type and digits show no abnormalities. HAV  B/L.    Skin: No Porokeratosis. No infection or ulcers  Diagnosis:  Onychomycosis, , Pain in right toe, pain in left toes  Treatment & Plan Procedures and Treatment: Consent by patient was obtained for treatment procedures.   Debridement of mycotic and hypertrophic toenails, 1 through 5 bilateral and clearing of subungual debris. No ulceration, no infection noted. Return Visit-Office Procedure: Patient instructed to return to the office for a follow up visit 3 months for continued evaluation and treatment.    Gardiner Barefoot DPM

## 2019-07-17 ENCOUNTER — Telehealth: Payer: Self-pay | Admitting: Radiation Oncology

## 2019-07-17 NOTE — Telephone Encounter (Signed)
Received voicemail message from patient's wife, Jobe Gibbon, requesting a return call about CT scan and follow up appointment. Phoned wife back. Confirmed her understanding the patient should have a follow up chest CT in October on or around the 15th and a follow up with the PA to review the results. Assured her that PA [facility] placed the order for the CT. Explained the CT hasn't been scheduled yet. Jobe Gibbon request the CT be scheduled at one of the [facility] locations because the patient has to walk a much shorter distance when he is scanned there unlike the hospital. She request the morning of 10/6 and 10/16 be avoided because her husband has other appointments during that time. She request when the appointments are made a voicemail message be left on the home answering machine since she won't be back from the beach until 9/26.

## 2019-07-22 ENCOUNTER — Other Ambulatory Visit: Payer: Self-pay | Admitting: Radiation Oncology

## 2019-07-22 ENCOUNTER — Telehealth: Payer: Self-pay | Admitting: *Deleted

## 2019-07-22 NOTE — Telephone Encounter (Signed)
CALLED PATIENT TO INFORM OF CT FOR 08-03-19 @ [facility]- ARRIVAL TIME- 1:45 PM , PT. TO BE NPO- 4 HRS. PRIOR TO TEST , AND PATIENT TO RECEIVE RESULTS FROM ALISON PERKINS VIA TELEPHONE ON 08-10-19, LVM FOR A RETURN CALL

## 2019-08-03 ENCOUNTER — Ambulatory Visit
Admission: RE | Admit: 2019-08-03 | Discharge: 2019-08-03 | Disposition: A | Discharge status: Home / Self Care | Payer: PPO | Source: Ambulatory Visit | Attending: Radiation Oncology | Admitting: Radiation Oncology

## 2019-08-03 ENCOUNTER — Other Ambulatory Visit: Payer: Self-pay

## 2019-08-03 DIAGNOSIS — C3412 Malignant neoplasm of upper lobe, left bronchus or lung: Secondary | ICD-10-CM

## 2019-08-03 DIAGNOSIS — C3490 Malignant neoplasm of unspecified part of unspecified bronchus or lung: Secondary | ICD-10-CM | POA: Diagnosis not present

## 2019-08-03 MED ORDER — IOPAMIDOL (ISOVUE-300) INJECTION 61%
75.0000 mL | Freq: Once | INTRAVENOUS | Status: AC | PRN
  Administered 2019-08-03: 75 mL via INTRAVENOUS

## 2019-08-04 ENCOUNTER — Other Ambulatory Visit: Payer: Self-pay | Admitting: Family Medicine

## 2019-08-05 ENCOUNTER — Ambulatory Visit
Admission: RE | Admit: 2019-08-05 | Discharge: 2019-08-05 | Disposition: A | Discharge status: Home / Self Care | Payer: PPO | Source: Ambulatory Visit | Attending: Radiation Oncology | Admitting: Radiation Oncology

## 2019-08-05 ENCOUNTER — Telehealth: Payer: Self-pay | Admitting: Radiation Oncology

## 2019-08-05 ENCOUNTER — Other Ambulatory Visit: Payer: Self-pay

## 2019-08-05 DIAGNOSIS — C3432 Malignant neoplasm of lower lobe, left bronchus or lung: Secondary | ICD-10-CM

## 2019-08-05 DIAGNOSIS — F17210 Nicotine dependence, cigarettes, uncomplicated: Secondary | ICD-10-CM | POA: Diagnosis not present

## 2019-08-05 DIAGNOSIS — C320 Malignant neoplasm of glottis: Secondary | ICD-10-CM

## 2019-08-05 DIAGNOSIS — S2239XA Fracture of one rib, unspecified side, initial encounter for closed fracture: Secondary | ICD-10-CM | POA: Insufficient documentation

## 2019-08-05 DIAGNOSIS — C3412 Malignant neoplasm of upper lobe, left bronchus or lung: Secondary | ICD-10-CM

## 2019-08-05 DIAGNOSIS — Z8521 Personal history of malignant neoplasm of larynx: Secondary | ICD-10-CM | POA: Diagnosis not present

## 2019-08-05 DIAGNOSIS — S2232XA Fracture of one rib, left side, initial encounter for closed fracture: Secondary | ICD-10-CM | POA: Diagnosis not present

## 2019-08-05 DIAGNOSIS — Z85118 Personal history of other malignant neoplasm of bronchus and lung: Secondary | ICD-10-CM | POA: Diagnosis not present

## 2019-08-05 DIAGNOSIS — C3411 Malignant neoplasm of upper lobe, right bronchus or lung: Secondary | ICD-10-CM

## 2019-08-05 DIAGNOSIS — Z08 Encounter for follow-up examination after completed treatment for malignant neoplasm: Secondary | ICD-10-CM | POA: Diagnosis not present

## 2019-08-05 MED ORDER — AMOXICILLIN-POT CLAVULANATE 875-125 MG PO TABS: 1.0000 | ORAL_TABLET | Freq: Two times a day (BID) | ORAL | 0 refills | Status: DC

## 2019-08-05 NOTE — Progress Notes (Signed)
Radiation Oncology         [phone] ________________________________  Outpatient Follow Up - Conducted via telephone due to current COVID-19 concerns for limiting patient exposure  I spoke with the patient to conduct this consult visit via telephone to spare the patient unnecessary potential exposure in the healthcare setting during the current COVID-19 pandemic. The patient was notified in advance and was offered a [facility] meeting to allow for face to face communication but unfortunately reported that they did not have the appropriate resources/technology to support such a visit and instead preferred to proceed with a telephone visit.   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   HISTORY OF PRESENT ILLNESS: [name] is a 68 y.o. male with a history of Stage IA, pT1aN0M0 NSCLC, SCC of the RUL s/p lobectomy in 2013 with Dr. [name]. He developed a new nodule in 2017 in the LLL and a biopsy of this site was also consistent with NSCLC, SCC. He received SBRT to this site and was found in surveillance to have a PET positive lesion in the right vocal cord. He had stage I disease and was treated with radiotherapy in 2018. He has been followed in surveillance with Dr. Redmond Baseman and is NED as of August 2020's examination. He was treated in April 2020 for a putative Stage IB lesion in the LUL with radiotherapy.   He is contacted today to review a surveillance CT following that treatment. Unfortunately this scan was delayed due to scheduling inefficiencies. His scan on 08/03/2019  revealed improvement in the most recently treated LUL target and stable post treatment changes in the LLL. He has nodes in the mediastinum that continue to be followed and have been stable. He did have a fracture of the 7th left rib, and some opacity changes in the RML felt to be consistent with inflammatory or infectious findings.     MOST RECENT RADIATION THERAPY:   02/10/2019-02/18/2019 SBRT Treatment: The target in the LUL was treated to 54 Gy in 3 fractions of 18 Gy  08/26/2017 - 10/04/2017: The larynx was treated to 63 Gy in 28 fractions of 2.25 Gy.  09/17/2016 - 09/24/2016 SBRT Treatment: TheLLLtarget was treated to 54Gy in 69fractions of 18Gy  PAST MEDICAL HISTORY:  Past Medical History:  Diagnosis Date  . Allergy   . Anemia   . Anginal pain ([facility])   . Arthritis    "hands"  . Asthma   . Back pain    4 deteriorating disc and receives an injection q3-6mon;has been doing this for about 68yrs  . Blood transfusion    as a child  . Bronchitis   . Cancer ([facility]) dx'd 2013   Non-small cell lung cancer  . Chronic kidney disease    acute kidney failure post surgery  . COPD (chronic obstructive pulmonary disease) (HCC)    uses Albuterol and Spiriva daily  . Coronary artery disease    has 1 stent  . Depression    takes Prozac daily  . Emphysema    sees Dr.Ramaswami  for this  . Family history of adverse reaction to anesthesia    mother was hard to wake up sometimes  . GERD (gastroesophageal reflux disease)    takes Prilosec daily, EGD nml 06/2017  . Glaucoma    hx of  . H/O hiatal hernia   . Hypertension    takes  HYzaar daily  . Insomnia    takes Trazodone nightly  . Lung mass    right upper lobe  . Myocardial infarct ([facility])   . 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 Obesity   . 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 Periodic limb movements of sleep   . Peripheral neuropathy   . Pneumonia    hx of' 68 yo,rd' last time in 2013  . Productive  cough    white in color but no odor  . Shortness of breath    with exertion   . Sleep apnea    uses BiPaP; "no longer have sleep apnea since I have lost over 100 lbs"  . Syncope and collapse 05/26/2013  . Thyroid disease   . Type II diabetes mellitus (HCC)    takes Metformin bid and Novolog and Lantus daily       PAST SURGICAL HISTORY: Past Surgical History:  Procedure Laterality Date  . CARDIAC CATHETERIZATION  2006/2008/2009  . CARPAL TUNNEL RELEASE     bilateral  . COLONOSCOPY    . CORONARY ANGIOPLASTY WITH STENT PLACEMENT     1 stent  . ELBOW SURGERY     ulnar nerve; left  . ESOPHAGOGASTRODUODENOSCOPY    . EYE SURGERY     pt. denies  . FUDUCIAL PLACEMENT Left 08/24/2016   Procedure: PLACEMENT OF FUDUCIAL;  Surgeon: Melrose Nakayama, MD;  Location: [facility];  Service: Thoracic;  Laterality: Left;  . INGUINAL HERNIA REPAIR  1990's   double inguinal  . LOBECTOMY  03/17/2012   upper right side with resection  . MICROLARYNGOSCOPY WITH CO2 LASER AND EXCISION OF VOCAL CORD LESION N/A 06/28/2017   Procedure: MICROLARYNGOSCOPY WITH BIOPSY;  Surgeon: Melida Quitter, MD;  Location: [facility];  Service: ENT;  Laterality: N/A;  . MICROLARYNGOSCOPY WITH CO2 LASER AND EXCISION OF VOCAL CORD LESION N/A 07/19/2017   Procedure: MICROLARYNGOSCOPY WITH CO2 LASER AND EXCISION OF VOCAL CORD LESION;  Surgeon: Melida Quitter, MD;  Location: [facility];  Service: ENT;  Laterality: N/A;  MICRO DIRECT LARYNGOSCOPY WITH CO2 LASER VOCAL CORD STRIPPING/POSS JET VENTURI VENTILATION  . REFRACTIVE SURGERY     bilaterally  . ROTATOR CUFF REPAIR     left  . VIDEO BRONCHOSCOPY WITH ENDOBRONCHIAL NAVIGATION N/A 08/24/2016   Procedure: VIDEO BRONCHOSCOPY WITH ENDOBRONCHIAL NAVIGATION;  Surgeon: Melrose Nakayama, MD;  Location: [facility];  Service: Thoracic;  Laterality: N/A;     FAMILY HISTORY:  Family History  Problem Relation Age of Onset  . COPD Mother   . Diabetes Mother   . Hypertension Mother   . Heart  attack Father   . Diabetes Father   . Anesthesia problems Neg Hx   . Hypotension Neg Hx   . Malignant hyperthermia Neg Hx   . Pseudochol deficiency Neg Hx      SOCIAL HISTORY:  reports that he has been smoking cigarettes. He has a 81.00 pack-year smoking history. He has never used smokeless tobacco. He reports current drug use. Drugs: Cocaine and Marijuana. He reports that he does not drink alcohol. The patient is married and lives in  [place].    ALLERGIES: Actos [pioglitazone], Avandia [rosiglitazone], and Dilaudid [hydromorphone hcl]   MEDICATIONS:  Current Outpatient Medications  Medication Sig Dispense Refill  . acetaminophen (TYLENOL) 500 MG tablet Take 1,000 mg by mouth every 6 (six) hours as needed for moderate pain.    Marland Kitchen amoxicillin-clavulanate (AUGMENTIN) 875-125 MG tablet Take 1 tablet by mouth 2 (two) times daily. 20 tablet 0  . atorvastatin (LIPITOR) 40 MG tablet Take 40 mg by mouth daily.    Marland Kitchen diltiazem (CARDIZEM CD) 120 MG 24 hr capsule Take 1 capsule (120 mg total) by mouth daily. 90 capsule 3  . Docosanol (ABREVA EX) Apply 1 application topically daily as needed (cold sores).    Marland Kitchen FLUoxetine (PROZAC) 40 MG capsule TAKE 1 CAPSULE BY MOUTH EVERY DAY 90 capsule 3  . fluticasone (FLONASE) 50 MCG/ACT nasal spray Place 2 sprays into both nostrils as needed.   5  . gabapentin (NEURONTIN) 300 MG capsule Take 1 capsule (300 mg total) by mouth 3 (three) times daily. 270 capsule 2  . ipratropium-albuterol (DUONEB) 0.5-2.5 (3) MG/3ML SOLN USE 1 VIAL IN NEBULIZER 4 TIMES A DAY 360 mL 5  . LANTUS 100 UNIT/ML injection Inject 4 Units into the skin every morning.   3  . losartan (COZAAR) 25 MG tablet Take 25 mg by mouth daily.    . Multiple Vitamin (MULTIVITAMIN WITH MINERALS) TABS Take 1 tablet by mouth daily.    . nitroGLYCERIN (NITROSTAT) 0.4 MG SL tablet Place 0.4 mg under the tongue every 5 (five) minutes as needed for chest pain.    . ONE TOUCH ULTRA TEST test strip     .  ranitidine (ZANTAC) 150 MG capsule Take 1 capsule (150 mg total) by mouth 2 (two) times daily. (Patient taking differently: Take 150 mg by mouth daily. ) 60 capsule 11  . Tiotropium Bromide Monohydrate (SPIRIVA RESPIMAT) 2.5 MCG/ACT AERS Inhale 2 puffs into the lungs daily. 2 Inhaler 0  . traZODone (DESYREL) 100 MG tablet TAKE 1 TABLET BY MOUTH EVERYDAY AT BEDTIME 90 tablet 1   Current Facility-Administered Medications  Medication Dose Route Frequency Provider Last Rate Last Dose  . albuterol (PROVENTIL) (2.5 MG/3ML) 0.083% nebulizer solution 2.5 mg  2.5 mg Nebulization Once Delsa Grana, PA-C         REVIEW OF SYSTEMS: On review of systems, the patient reports that he is doing well overall. He has noticed some low chest wall pain on the left recently. He denies any sternal chest pain, shortness of breath, fevers, chills, night sweats, unintended weight changes. He is no longer on O2, and reports he's been off for about 2 years. He has noticed a recent productive cough with congestion but denies any known Covid exposures, and has not been out of the house with the exception of doctor appointments.  He denies any bowel or bladder disturbances, and denies abdominal pain, nausea or vomiting. He denies any other new musculoskeletal or joint aches or pains. A complete review of systems is obtained and is otherwise negative.     PHYSICAL EXAM:  Unable to assess due to encounter type.  ECOG = 1  0 - Asymptomatic (Fully active, able to carry on all predisease activities without restriction)  1 - Symptomatic but completely ambulatory (Restricted in physically strenuous activity but ambulatory and able to carry out work of a light or sedentary nature. For example, light housework, office work)  2 - Symptomatic, <50% in bed during the day (Ambulatory and capable of all  self care but unable to carry out any work activities. Up and about more than 50% of waking hours)  3 - Symptomatic, >50% in bed, but  not bedbound (Capable of only limited self-care, confined to bed or chair 50% or more of waking hours)  4 - Bedbound (Completely disabled. Cannot carry on any self-care. Totally confined to bed or chair)  5 - Death   Eustace Pen MM, Creech RH, Tormey DC, et al. [phone]). "Toxicity and response criteria of the [facility]". [place] Oncol. 5 (6): 649-55    LABORATORY DATA:  Lab Results  Component Value Date   WBC 6.6 11/10/2018   HGB 13.9 11/10/2018   HCT 41.1 11/10/2018   MCV 94.9 11/10/2018   PLT 148 11/10/2018   Lab Results  Component Value Date   NA 140 12/24/2018   K 4.6 12/24/2018   CL 103 12/24/2018   CO2 28 12/24/2018   Lab Results  Component Value Date   ALT 13 11/10/2018   AST 16 11/10/2018   ALKPHOS 108 04/01/2018   BILITOT 0.4 11/10/2018      RADIOGRAPHY: Ct Chest W Contrast  Result Date: 08/03/2019 CLINICAL DATA:  Non-small cell lung cancer, follow-up after radiation for left and right lung cancer and history of right upper lobe lesion resection in 2017., also with history of squamous cell carcinoma of the vocal cord and subsequently diagnosed lesion in the left upper lobe now status post SBRT EXAM: CT CHEST WITH CONTRAST TECHNIQUE: Multidetector CT imaging of the chest was performed during intravenous contrast administration. CONTRAST:  73mL ISOVUE-300 IOPAMIDOL (ISOVUE-300) INJECTION 61% COMPARISON:  12/24/2018 and PET exam of January 08, 2019. FINDINGS: Cardiovascular: Moderate atherosclerosis in signs of coronary artery disease without signs of aneurysm in the chest. Heart size stable without pericardial effusion. Signs of mitral annular calcification unchanged from previous exam. Central pulmonary arteries are unremarkable. Mediastinum/Nodes: Stable right paratracheal lymph node approximately 1.11.2 cm in short axis. Other small lymph nodes in the chest including a subcarinal lymph node demonstrates no interval change. Lungs/Pleura: Left upper lobe  lesion measuring 12 x 5 5 mm, previously 2.4 by 9 mm. Mild thickening extends to the pleura with bandlike appearance extending from the lesion less conspicuous than on the prior study. Right middle lobe nodule measuring 4 mm is unchanged. Right middle lobe nodule with hazy peripheral ground-glass at 6 mm adjacent small nodularity in the 2-3 mm range has developed since the previous exam. Particular opacities in the right lung base along the subpleural lung and background pulmonary emphysema both paraseptal and centrilobular not changed from previous exam. Area post treatment change in the left dependent chest is stable. Upper Abdomen: Presumed renal cysts partially imaged. No signs of acute upper abdominal process. Musculoskeletal: Minimally displaced left posterior seventh rib fracture. Seventh rib fracture with acute or subacute features, suggestion of some periosteal reaction. IMPRESSION: 1. New area of nodularity in the right middle lobe suggestive of infectious or inflammatory process, attention on follow-up. 2. Diminished size of treated area in the left upper lobe. 3. Minimally displaced fracture, likely subacute involving posterior left seventh rib. No signs of pneumothorax or other complicating feature. 4. Stable appearance of small right middle lobe nodule and chronic changes in lung parenchyma. 5. Signs of coronary artery disease and mitral calcification as before. Aortic Atherosclerosis (ICD10-I70.0) and Emphysema (ICD10-J43.9). Electronically Signed   By: Zetta Bills M.D.   On: 08/03/2019 15:05       IMPRESSION/PLAN: 1.  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 The patient has done well since completing his most recent course of SBRT. He has had improvement in the size of the treated lesion and I discussed that we would likely still be able to measure scar in the place of the prior cancer long term. He is in agreement with repeating a scan in 6 months which I've recommended per NCCN guidelines. He  will call sooner if needed for questions or concerns. 2.  Stage IA, cT1aN0M0,NSCLC,squamous cell carcinoma of the left lower lobe of lung and history of Stage IA, pT1aN0M0, NSCLC, squamous cell carcinoma of the RUL. These sites remains without active disease and will be followed expectantly during surveillance scans for #1 3.  Right Middle Lobe opacity. After reviewing his films and discussing with the patient, I favor this finding to be infectious given the patient's productive cough and congestion. We will go ahead with a course of Augmentin for 10 days. If he is no better following this, he is aware we would coordinate a visit with Dr. Chase Caller given his prior history of O2 dependence. He will see his PCP next Tuesday, and I'll ask Dr. Dennard Schaumann to let us know if he has any worsening symptoms when he sees the patient. 4. 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 The patient continues to follow with Dr. Redmond Baseman and has been NED since the radiotherapy treatment. He continues q6 months for laryngoscopic examinations.  5. Fracture of the left 7th rib. This is seen on imaging and most consistent with post radiotherapy fracture. We reviewed the rationale to continue Gabapentin, but since the symptoms are worse in the morning, to increase his am dose to 600 mg, then continue with 300 mg for the remaining two doses. He was encouraged to avoid NSAIDs due to his pre CT creatinine of 1.6. 6. Renal insufficency. The patient's baseline creatinine appears to be between 1.3 and 1.6. He will be followed by PCP for this but was encouraged to avoid NSAIDs.  Given current concerns for patient exposure during the COVID-19 pandemic, this encounter was conducted via telephone.  The patient has given verbal consent for this type of encounter. The time spent during this encounter was 15 minutes and 50% of that time was spent in the coordination of his care. The attendants for this meeting include  Shona Simpson, [facility] and Marylou and  Nash Dimmer During the encounter, Shona Simpson [facility] was located at [facility] Radiation Oncology Department.  Marylou and AFTON MIKELSON  were located at home.    Carola Rhine, PAC

## 2019-08-05 NOTE — Telephone Encounter (Signed)
error 

## 2019-08-10 ENCOUNTER — Ambulatory Visit: Payer: Self-pay | Admitting: Radiation Oncology

## 2019-08-11 ENCOUNTER — Ambulatory Visit (INDEPENDENT_AMBULATORY_CARE_PROVIDER_SITE_OTHER): Payer: PPO | Admitting: Family Medicine

## 2019-08-11 ENCOUNTER — Encounter: Payer: Self-pay | Admitting: Family Medicine

## 2019-08-11 ENCOUNTER — Other Ambulatory Visit: Payer: Self-pay

## 2019-08-11 VITALS — BP 120/64 | HR 78 | Temp 98.2°F | Resp 16 | Ht 71.0 in | Wt 174.0 lb

## 2019-08-11 DIAGNOSIS — R9389 Abnormal findings on diagnostic imaging of other specified body structures: Secondary | ICD-10-CM | POA: Diagnosis not present

## 2019-08-11 DIAGNOSIS — Z794 Long term (current) use of insulin: Secondary | ICD-10-CM | POA: Diagnosis not present

## 2019-08-11 DIAGNOSIS — Z125 Encounter for screening for malignant neoplasm of prostate: Secondary | ICD-10-CM

## 2019-08-11 DIAGNOSIS — R911 Solitary pulmonary nodule: Secondary | ICD-10-CM

## 2019-08-11 DIAGNOSIS — Z13220 Encounter for screening for lipoid disorders: Secondary | ICD-10-CM | POA: Diagnosis not present

## 2019-08-11 DIAGNOSIS — Z0001 Encounter for general adult medical examination with abnormal findings: Secondary | ICD-10-CM | POA: Diagnosis not present

## 2019-08-11 DIAGNOSIS — E1159 Type 2 diabetes mellitus with other circulatory complications: Secondary | ICD-10-CM

## 2019-08-11 DIAGNOSIS — C320 Malignant neoplasm of glottis: Secondary | ICD-10-CM | POA: Diagnosis not present

## 2019-08-11 DIAGNOSIS — C3411 Malignant neoplasm of upper lobe, right bronchus or lung: Secondary | ICD-10-CM

## 2019-08-11 DIAGNOSIS — Z23 Encounter for immunization: Secondary | ICD-10-CM

## 2019-08-11 MED ORDER — DONEPEZIL HCL 5 MG PO TABS: 5.0000 mg | ORAL_TABLET | Freq: Every day | ORAL | 2 refills | Status: DC

## 2019-08-11 NOTE — Addendum Note (Signed)
Addended by: Shary Decamp B on: 08/11/2019 11:42 AM   Modules accepted: Orders

## 2019-08-11 NOTE — Progress Notes (Signed)
Subjective:    Patient ID: [name], male    DOB: 09-04-1951, 68 y.o.   MRN: [id number]  HPI  The patient is a very pleasant 68 year old white male who has underlying history of lung cancer status post resection of the upper lobe out of his right lung. In 2013.  He has underlying history of severe COPD.  Recently the patient had a CT scan for surveillance of his cancer.  I have copied the results of the CT scan of his chest below but there was concerning for new areas of inflammation in the right middle lobe which could represent infection.  The patient was started on an antibiotic.  He denies any symptoms today.  He denies any cough or chest pain or shortness of breath beyond baseline.  He has been weaned off oxygen since I last saw him and is no longer on insulin.  He is doing well all things considered.  He is seeing his endocrinologist and states that his hemoglobin A1c 3 months ago was less than 7.   IMPRESSION: 1. New area of nodularity in the right middle lobe suggestive of infectious or inflammatory process, attention on follow-up. 2. Diminished size of treated area in the left upper lobe. 3. Minimally displaced fracture, likely subacute involving posterior left seventh rib. No signs of pneumothorax or other complicating feature. 4. Stable appearance of small right middle lobe nodule and chronic changes in lung parenchyma. 5. Signs of coronary artery disease and mitral calcification as Before.  Patient reports a recent fall.  He tripped while walking into a parking lot and he did fracture his rib he believes when he fell.  However he denies any issues with his balance.  He states that he simply tripped while walking into a parking lot.  He refuses to use a cane as he states that he does not feel it is necessary.  He denies any leg weakness.  He also denies any depression.  I question the patient about memory loss and he states that he has noticed some memory loss.  On 3 out  of 3 recall, the patient is unable to remember any of the 3 words I told him.  He was unable to perform serial sevens.  We tried having him spell world in reverse which he was able to do after 3 attempts.  He states that he has noticed short-term memory loss such as he and his wife will decide to go somewhere and by the time he leaves his house and is sitting in the car, he forgets where he intended to go.  He denies getting lost.  He denies losing money or important items.  He denies any hallucinations.  However his wife is commented that his memory is worsening.   Past Medical History:  Diagnosis Date  . Allergy   . Anemia   . Anginal pain ([place])   . Arthritis    "hands"  . Asthma   . Back pain    4 deteriorating disc and receives an injection q3-43mon;has been doing this for about 38yrs  . Blood transfusion    as a child  . Bronchitis   . Cancer ([place]) dx'd 2013   Non-small cell lung cancer  . Chronic kidney disease    acute kidney failure post surgery  . COPD (chronic obstructive pulmonary disease) (HCC)    uses Albuterol and Spiriva daily  . Coronary artery disease    has 1 stent  . Depression  takes Prozac daily  . Emphysema    sees Dr.Ramaswami for this  . Family history of adverse reaction to anesthesia    mother was hard to wake up sometimes  . GERD (gastroesophageal reflux disease)    takes Prilosec daily, EGD nml 06/2017  . Glaucoma    hx of  . H/O hiatal hernia   . Hypertension    takes  HYzaar daily  . Insomnia    takes Trazodone nightly  . Lung mass    right upper lobe  . Myocardial infarct ([place])   . 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 Obesity   . 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 Periodic limb movements of sleep   . Peripheral neuropathy   . Pneumonia    hx of' 68 yo,rd' last time in 2013  . Productive cough    white in color but no odor  . Shortness of breath    with exertion   . Sleep apnea    uses BiPaP; "no longer have  sleep apnea since I have lost over 100 lbs"  . Syncope and collapse 05/26/2013  . Thyroid disease   . Type II diabetes mellitus (HCC)    takes Metformin bid and Novolog and Lantus daily    Past Surgical History:  Procedure Laterality Date  . CARDIAC CATHETERIZATION  2006/2008/2009  . CARPAL TUNNEL RELEASE     bilateral  . COLONOSCOPY    . CORONARY ANGIOPLASTY WITH STENT PLACEMENT     1 stent  . ELBOW SURGERY     ulnar nerve; left  . ESOPHAGOGASTRODUODENOSCOPY    . EYE SURGERY     pt. denies  . FUDUCIAL PLACEMENT Left 08/24/2016   Procedure: PLACEMENT OF FUDUCIAL;  Surgeon: Melrose Nakayama, MD;  Location: [facility];  Service: Thoracic;  Laterality: Left;  . INGUINAL HERNIA REPAIR  1990's   double inguinal  . LOBECTOMY  03/17/2012   upper right side with resection  . MICROLARYNGOSCOPY WITH CO2 LASER AND EXCISION OF VOCAL CORD LESION N/A 06/28/2017   Procedure: MICROLARYNGOSCOPY WITH BIOPSY;  Surgeon: Melida Quitter, MD;  Location: [facility];  Service: ENT;  Laterality: N/A;  . MICROLARYNGOSCOPY WITH CO2 LASER AND EXCISION OF VOCAL CORD LESION N/A 07/19/2017   Procedure: MICROLARYNGOSCOPY WITH CO2 LASER AND EXCISION OF VOCAL CORD LESION;  Surgeon: Melida Quitter, MD;  Location: [facility];  Service: ENT;  Laterality: N/A;  MICRO DIRECT LARYNGOSCOPY WITH CO2 LASER VOCAL CORD STRIPPING/POSS JET VENTURI VENTILATION  . REFRACTIVE SURGERY     bilaterally  . ROTATOR CUFF REPAIR     left  . VIDEO BRONCHOSCOPY WITH ENDOBRONCHIAL NAVIGATION N/A 08/24/2016   Procedure: VIDEO BRONCHOSCOPY WITH ENDOBRONCHIAL NAVIGATION;  Surgeon: Melrose Nakayama, MD;  Location: [facility];  Service: Thoracic;  Laterality: N/A;   Current Outpatient Medications on File Prior to Visit  Medication Sig Dispense Refill  . acetaminophen (TYLENOL) 500 MG tablet Take 1,000 mg by mouth every 6 (six) hours as needed for moderate pain.    Marland Kitchen atorvastatin (LIPITOR) 40 MG tablet Take 40 mg by mouth daily.    Marland Kitchen diltiazem (CARDIZEM CD)  120 MG 24 hr capsule Take 1 capsule (120 mg total) by mouth daily. 90 capsule 3  . Docosanol (ABREVA EX) Apply 1 application topically daily as needed (cold sores).    Marland Kitchen FLUoxetine (PROZAC) 40 MG capsule TAKE 1 CAPSULE BY MOUTH EVERY DAY 90 capsule 3  . fluticasone (FLONASE) 50 MCG/ACT  nasal spray Place 2 sprays into both nostrils as needed.   5  . gabapentin (NEURONTIN) 300 MG capsule Take 1 capsule (300 mg total) by mouth 3 (three) times daily. 270 capsule 2  . ipratropium-albuterol (DUONEB) 0.5-2.5 (3) MG/3ML SOLN USE 1 VIAL IN NEBULIZER 4 TIMES A DAY 360 mL 5  . losartan (COZAAR) 25 MG tablet Take 25 mg by mouth daily.    . Multiple Vitamin (MULTIVITAMIN WITH MINERALS) TABS Take 1 tablet by mouth daily.    . nitroGLYCERIN (NITROSTAT) 0.4 MG SL tablet Place 0.4 mg under the tongue every 5 (five) minutes as needed for chest pain.    . ONE TOUCH ULTRA TEST test strip     . Tiotropium Bromide Monohydrate (SPIRIVA RESPIMAT) 2.5 MCG/ACT AERS Inhale 2 puffs into the lungs daily. 2 Inhaler 0  . traZODone (DESYREL) 100 MG tablet TAKE 1 TABLET BY MOUTH EVERYDAY AT BEDTIME 90 tablet 1  . [DISCONTINUED] albuterol (VENTOLIN HFA) 108 (90 Base) MCG/ACT inhaler TAKE 2 PUFFS BY MOUTH EVERY 6 HOURS AS NEEDED FOR WHEEZE OR SHORTNESS OF BREATH     Current Facility-Administered Medications on File Prior to Visit  Medication Dose Route Frequency Provider Last Rate Last Dose  . albuterol (PROVENTIL) (2.5 MG/3ML) 0.083% nebulizer solution 2.5 mg  2.5 mg Nebulization Once Delsa Grana, PA-C       Allergies  Allergen Reactions  . Actos [Pioglitazone] Swelling    EDEMA REACTION UNSPECIFIED  . Avandia [Rosiglitazone] Swelling    SWELLING REACTION UNSPECIFIED   . Dilaudid [Hydromorphone Hcl] Other (See Comments)    Made him crazy   Social History   Socioeconomic History  . Marital status: Married    Spouse name: mary lou  . Number of children: 3  . Years of education: trade   . Highest education level: Not  on file  Occupational History  . 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 Financial resource strain: Not on file  . Food insecurity    Worry: Not on file    Inability: Not on file  . Transportation needs    Medical: No    Non-medical: No  Tobacco Use  . Smoking status: Current Every Day Smoker    Packs/day: 1.50    Years: 54.00    Pack years: 81.00    Types: Cigarettes  . Smokeless tobacco: Never Used  . Tobacco comment: 1ppd as of 11/26/17  ep  Substance and Sexual Activity  . Alcohol use: No    Comment: no alcolol since 2013  . Drug use: Yes    Types: Cocaine, Marijuana    Comment: quit 1990's  . Sexual activity: Not Currently  Lifestyle  . Physical activity    Days per week: Not on file    Minutes per session: Not on file  . Stress: Not on file  Relationships  . Social Herbalist on phone: Not on file    Gets together: Not on file    Attends religious service: Not on file    Active member of club or organization: Not on file    Attends meetings of clubs or organizations: Not on file    Relationship status: Not on file  . Intimate partner violence    Fear of current or ex partner: Not on file    Emotionally abused: Not on file    Physically abused: Not on file    Forced sexual activity: Not on file  Other  Topics Concern  . 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   Family History  Problem Relation Age of Onset  . COPD Mother   . Diabetes Mother   . Hypertension Mother   . Heart attack Father   . Diabetes Father   . Anesthesia problems Neg Hx   . Hypotension Neg Hx   . Malignant hyperthermia Neg Hx   . Pseudochol deficiency Neg Hx       Review of Systems  All other systems reviewed and are negative.       Objective:   Physical Exam  Constitutional: He is oriented to person, place, and time. He appears well-developed and well-nourished. No distress.  HENT:  Head: Normocephalic and atraumatic.  Right Ear: External ear normal.  Left Ear: External ear normal.  Nose: Nose normal.  Mouth/Throat: Oropharynx is clear and moist. No oropharyngeal exudate.  Eyes: Pupils are equal, round, and reactive to light. Conjunctivae and EOM are normal. Right eye exhibits no discharge. Left eye exhibits no discharge. No scleral icterus.  Neck: Neck supple. No JVD present. No tracheal deviation present. No thyromegaly present.  Cardiovascular: Normal rate, regular rhythm, normal heart sounds and intact distal pulses. Exam reveals no gallop and no friction rub.  No murmur heard. Pulmonary/Chest: No accessory muscle usage or stridor. No tachypnea and no bradypnea. No respiratory distress. He has decreased breath sounds in the right upper field and the right middle field. He has no wheezes. He has no rales.  Abdominal: Soft. Bowel sounds are normal. He exhibits no distension. There is no abdominal tenderness. There is no rebound and no guarding.  Musculoskeletal:        General: No edema.  Lymphadenopathy:    He has no cervical adenopathy.  Neurological: He is alert and oriented to person, place, and time. He displays normal reflexes. No cranial nerve deficit. He exhibits normal muscle tone. Coordination normal.  Skin: No rash noted. He is not diaphoretic. No erythema. No pallor.  Psychiatric: He has a normal mood and affect. His behavior is normal. Judgment and thought content normal.  Vitals reviewed.         Assessment & Plan:  Abnormal CT of the chest  Solitary pulmonary nodule - Plan: CT Chest W Contrast  Prostate cancer screening - Plan: PSA  Screening cholesterol level - Plan: COMPLETE METABOLIC PANEL WITH GFR, Lipid panel  Type 2 diabetes mellitus with other circulatory complication, with  long-term current use of insulin (HCC)  Non-small cell carcinoma of lung, stage 1 (HCC)  Primary cancer of glottis (HCC)  Patient's exam today is significant for fibrosis to palpation in the anterior portion of his neck.  Otherwise there is no palpable nodules.  I believe this is most likely fibrosis and scarring.  I have recommended that we repeat a CT scan of the chest in 4 weeks to ensure resolution of the areas of nodularity seen in the right middle lobe after the antibiotics.  I will screen for prostate cancer with a PSA.  I will check his cholesterol with a fasting lipid panel.  His goal LDL cholesterol is less than 70.  He is no longer on insulin.  His diabetes  is being managed by his endocrinologist.  Patient received his flu shot today as well as a booster for Pneumovax 23.  His colonoscopy was last performed in 2012 and is not due again until 2022.   Patient's Mini-Mental status exam today is abnormal and does show mild to moderate short-term memory loss.  I recommended starting Aricept 5 mg a day and then reassessing via telephone in 1 month

## 2019-08-12 LAB — COMPLETE METABOLIC PANEL WITH GFR
AG Ratio: 1.8 (calc) (ref 1.0–2.5)
ALT: 10 U/L (ref 9–46)
AST: 13 U/L (ref 10–35)
Albumin: 4.4 g/dL (ref 3.6–5.1)
Alkaline phosphatase (APISO): 85 U/L (ref 35–144)
BUN/Creatinine Ratio: 25 (calc) — ABNORMAL HIGH (ref 6–22)
BUN: 34 mg/dL — ABNORMAL HIGH (ref 7–25)
CO2: 28 mmol/L (ref 20–32)
Calcium: 9.7 mg/dL (ref 8.6–10.3)
Chloride: 102 mmol/L (ref 98–110)
Creat: 1.37 mg/dL — ABNORMAL HIGH (ref 0.70–1.25)
GFR, Est African American: 61 mL/min/{1.73_m2} (ref >=60)
GFR, Est Non African American: 52 mL/min/{1.73_m2} — ABNORMAL LOW (ref >=60)
Globulin: 2.4 g/dL (calc) (ref 1.9–3.7)
Glucose, Bld: 90 mg/dL (ref 65–99)
Potassium: 5.4 mmol/L — ABNORMAL HIGH (ref 3.5–5.3)
Sodium: 137 mmol/L (ref 135–146)
Total Bilirubin: 0.6 mg/dL (ref 0.2–1.2)
Total Protein: 6.8 g/dL (ref 6.1–8.1)

## 2019-08-12 LAB — PSA: PSA: 0.6 ng/mL (ref <=4.0)

## 2019-08-12 LAB — LIPID PANEL
Cholesterol: 142 mg/dL (ref <200)
HDL: 44 mg/dL (ref >=40)
LDL Cholesterol (Calc): 82 mg/dL (calc)
Non-HDL Cholesterol (Calc): 98 mg/dL (calc) (ref <130)
Total CHOL/HDL Ratio: 3.2 (calc) (ref <5.0)
Triglycerides: 77 mg/dL (ref <150)

## 2019-08-21 DIAGNOSIS — E1142 Type 2 diabetes mellitus with diabetic polyneuropathy: Secondary | ICD-10-CM | POA: Diagnosis not present

## 2019-08-21 DIAGNOSIS — N1831 Chronic kidney disease, stage 3a: Secondary | ICD-10-CM | POA: Diagnosis not present

## 2019-08-21 DIAGNOSIS — E1165 Type 2 diabetes mellitus with hyperglycemia: Secondary | ICD-10-CM | POA: Diagnosis not present

## 2019-08-30 ENCOUNTER — Other Ambulatory Visit: Payer: Self-pay | Admitting: Interventional Cardiology

## 2019-08-31 ENCOUNTER — Telehealth: Payer: Self-pay | Admitting: Family Medicine

## 2019-08-31 NOTE — Telephone Encounter (Signed)
Pt needs a note excusing him from jury duty.

## 2019-09-01 ENCOUNTER — Encounter: Payer: Self-pay | Admitting: Family Medicine

## 2019-09-01 NOTE — Telephone Encounter (Signed)
Letter on desk

## 2019-09-02 NOTE — Telephone Encounter (Signed)
Letter signed, left up front and pt aware via vm

## 2019-09-09 ENCOUNTER — Ambulatory Visit
Admission: RE | Admit: 2019-09-09 | Discharge: 2019-09-09 | Disposition: A | Discharge status: Home / Self Care | Payer: PPO | Source: Ambulatory Visit | Attending: Family Medicine | Admitting: Family Medicine

## 2019-09-09 ENCOUNTER — Other Ambulatory Visit: Payer: Self-pay | Admitting: Family Medicine

## 2019-09-09 DIAGNOSIS — R918 Other nonspecific abnormal finding of lung field: Secondary | ICD-10-CM | POA: Diagnosis not present

## 2019-09-09 DIAGNOSIS — R9389 Abnormal findings on diagnostic imaging of other specified body structures: Secondary | ICD-10-CM

## 2019-09-09 DIAGNOSIS — R911 Solitary pulmonary nodule: Secondary | ICD-10-CM

## 2019-09-09 DIAGNOSIS — J439 Emphysema, unspecified: Secondary | ICD-10-CM | POA: Diagnosis not present

## 2019-09-09 MED ORDER — IOPAMIDOL (ISOVUE-300) INJECTION 61%
75.0000 mL | Freq: Once | INTRAVENOUS | Status: AC | PRN
  Administered 2019-09-09: 10:00:00 75 mL via INTRAVENOUS

## 2019-09-11 ENCOUNTER — Other Ambulatory Visit: Payer: Self-pay

## 2019-09-11 ENCOUNTER — Ambulatory Visit (INDEPENDENT_AMBULATORY_CARE_PROVIDER_SITE_OTHER): Payer: PPO | Admitting: Family Medicine

## 2019-09-11 DIAGNOSIS — R413 Other amnesia: Secondary | ICD-10-CM | POA: Diagnosis not present

## 2019-09-11 MED ORDER — DONEPEZIL HCL 10 MG PO TABS: 10.0000 mg | ORAL_TABLET | Freq: Every day | ORAL | 3 refills | Status: DC

## 2019-09-11 NOTE — Progress Notes (Signed)
Subjective:    Patient ID: [name], male    DOB: February 20, 1951, 68 y.o.   MRN: [id number]  HPI 08/11/19 The patient is a very pleasant 68 year old white male who has underlying history of lung cancer status post resection of the upper lobe out of his right lung. In 2013.  He has underlying history of severe COPD.  Recently the patient had a CT scan for surveillance of his cancer.  I have copied the results of the CT scan of his chest below but there was concerning for new areas of inflammation in the right middle lobe which could represent infection.  The patient was started on an antibiotic.  He denies any symptoms today.  He denies any cough or chest pain or shortness of breath beyond baseline.  He has been weaned off oxygen since I last saw him and is no longer on insulin.  He is doing well all things considered.  He is seeing his endocrinologist and states that his hemoglobin A1c 3 months ago was less than 7.   IMPRESSION: 1. New area of nodularity in the right middle lobe suggestive of infectious or inflammatory process, attention on follow-up. 2. Diminished size of treated area in the left upper lobe. 3. Minimally displaced fracture, likely subacute involving posterior left seventh rib. No signs of pneumothorax or other complicating feature. 4. Stable appearance of small right middle lobe nodule and chronic changes in lung parenchyma. 5. Signs of coronary artery disease and mitral calcification as Before.  Patient reports a recent fall.  He tripped while walking into a parking lot and he did fracture his rib he believes when he fell.  However he denies any issues with his balance.  He states that he simply tripped while walking into a parking lot.  He refuses to use a cane as he states that he does not feel it is necessary.  He denies any leg weakness.  He also denies any depression.  I question the patient about memory loss and he states that he has noticed some memory loss.  On  3 out of 3 recall, the patient is unable to remember any of the 3 words I told him.  He was unable to perform serial sevens.  We tried having him spell world in reverse which he was able to do after 3 attempts.  He states that he has noticed short-term memory loss such as he and his wife will decide to go somewhere and by the time he leaves his house and is sitting in the car, he forgets where he intended to go.  He denies getting lost.  He denies losing money or important items.  He denies any hallucinations.  However his wife is commented that his memory is worsening.  At that time, my plan was: Patient's exam today is significant for fibrosis to palpation in the anterior portion of his neck.  Otherwise there is no palpable nodules.  I believe this is most likely fibrosis and scarring.  I have recommended that we repeat a CT scan of the chest in 4 weeks to ensure resolution of the areas of nodularity seen in the right middle lobe after the antibiotics.  I will screen for prostate cancer with a PSA.  I will check his cholesterol with a fasting lipid panel.  His goal LDL cholesterol is less than 70.  He is no longer on insulin.  His diabetes is being managed by his endocrinologist.  Patient received his flu shot today  as well as a booster for Pneumovax 23.  His colonoscopy was last performed in 2012 and is not due again until 2022.   Patient's Mini-Mental status exam today is abnormal and does show mild to moderate short-term memory loss.  I recommended starting Aricept 5 mg a day and then reassessing via telephone in 1 month  09/11/19 Patient is being seen today as a telephone visit.  Phone call began at 1219.  Phone call concluded at 1237.  Patient consents to be seen via telephone.  At the last visit, the patient was taking antibiotics for nodular areas found in the right middle lobe on a recent CT scan.  Question was whether these were inflammatory infectious processes versus possible malignancy.  I  repeated a CT scan yesterday and the CT scan shows interval resolution of these nodular areas suggesting an underlying infectious or inflammatory process and no malignancy.  This is great news.  The patient states that he is tolerating the Aricept 5 mg a day without difficulty.  He does have occasional diarrhea since starting the medication but nothing that would cause him to stop medication.  He denies any bradycardia, hallucinations, or vivid dreams on the medication.  His wife states that she cannot tell a large difference however he may be doing slightly better since taking the medication.  I did perform a Mini-Mental status exam today and what was significant as the was that the patient could remember 3 out of 3 words on recall when he scored 0 out of 3 at his last office visit.   Past Medical History:  Diagnosis Date   Allergy    Anemia    Anginal pain ([place])    Arthritis    "hands"   Asthma    Back pain    4 deteriorating disc and receives an injection q3-30mon;has been doing this for about 64yrs   Blood transfusion    as a child   Bronchitis    Cancer ([place]) dx'd 2013   Non-small cell lung cancer   Chronic kidney disease    acute kidney failure post surgery   COPD (chronic obstructive pulmonary disease) (HCC)    uses Albuterol and Spiriva daily   Coronary artery disease    has 1 stent   Depression    takes Prozac daily   Emphysema    sees Dr.Ramaswami for this   Family history of adverse reaction to anesthesia    mother was hard to wake up sometimes   GERD (gastroesophageal reflux disease)    takes Prilosec daily, EGD nml 06/2017   Glaucoma    hx of   H/O hiatal hernia    Hypertension    takes  HYzaar daily   Insomnia    takes Trazodone nightly   Lung mass    right upper lobe   Myocardial infarct [facility])    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   Obesity    Parathyroid disease ([place])    Periodic limb movements of sleep      Peripheral neuropathy    Pneumonia    hx of' 68 yo,rd' last time in 2013   Productive cough    white in color but no odor   Shortness of breath    with exertion    Sleep apnea    uses BiPaP; "no longer have sleep apnea since I have lost over 100 lbs"   Syncope and collapse 05/26/2013   Thyroid disease  Type II diabetes mellitus (HCC)    takes Metformin bid and Novolog and Lantus daily    Past Surgical History:  Procedure Laterality Date   CARDIAC CATHETERIZATION  2006/2008/2009   CARPAL TUNNEL RELEASE     bilateral   COLONOSCOPY     CORONARY ANGIOPLASTY WITH STENT PLACEMENT     1 stent   ELBOW SURGERY     ulnar nerve; left   ESOPHAGOGASTRODUODENOSCOPY     EYE SURGERY     pt. denies   FUDUCIAL PLACEMENT Left 08/24/2016   Procedure: PLACEMENT OF FUDUCIAL;  Surgeon: Melrose Nakayama, MD;  Location: [place];  Service: Thoracic;  Laterality: Left;   INGUINAL HERNIA REPAIR  1990's   double inguinal   LOBECTOMY  03/17/2012   upper right side with resection   MICROLARYNGOSCOPY WITH CO2 LASER AND EXCISION OF VOCAL CORD LESION N/A 06/28/2017   Procedure: MICROLARYNGOSCOPY WITH BIOPSY;  Surgeon: Melida Quitter, MD;  Location: [place];  Service: ENT;  Laterality: N/A;   MICROLARYNGOSCOPY WITH CO2 LASER AND EXCISION OF VOCAL CORD LESION N/A 07/19/2017   Procedure: MICROLARYNGOSCOPY WITH CO2 LASER AND EXCISION OF VOCAL CORD LESION;  Surgeon: Melida Quitter, MD;  Location: [place];  Service: ENT;  Laterality: N/A;  MICRO DIRECT LARYNGOSCOPY WITH CO2 LASER VOCAL CORD STRIPPING/POSS JET VENTURI VENTILATION   REFRACTIVE SURGERY     bilaterally   ROTATOR CUFF REPAIR     left   VIDEO BRONCHOSCOPY WITH ENDOBRONCHIAL NAVIGATION N/A 08/24/2016   Procedure: VIDEO BRONCHOSCOPY WITH ENDOBRONCHIAL NAVIGATION;  Surgeon: Melrose Nakayama, MD;  Location: [facility];  Service: Thoracic;  Laterality: N/A;   Current Outpatient Medications on File Prior to Visit  Medication Sig Dispense  Refill   acetaminophen (TYLENOL) 500 MG tablet Take 1,000 mg by mouth every 6 (six) hours as needed for moderate pain.     atorvastatin (LIPITOR) 40 MG tablet Take 40 mg by mouth daily.     diltiazem (CARDIZEM CD) 120 MG 24 hr capsule TAKE 1 CAPSULE BY MOUTH EVERY DAY 90 capsule 0   Docosanol (ABREVA EX) Apply 1 application topically daily as needed (cold sores).     donepezil (ARICEPT) 5 MG tablet Take 1 tablet (5 mg total) by mouth at bedtime. 30 tablet 2   FLUoxetine (PROZAC) 40 MG capsule TAKE 1 CAPSULE BY MOUTH EVERY DAY 90 capsule 3   fluticasone (FLONASE) 50 MCG/ACT nasal spray Place 2 sprays into both nostrils as needed.   5   gabapentin (NEURONTIN) 300 MG capsule Take 1 capsule (300 mg total) by mouth 3 (three) times daily. 270 capsule 2   ipratropium-albuterol (DUONEB) 0.5-2.5 (3) MG/3ML SOLN USE 1 VIAL IN NEBULIZER 4 TIMES A DAY 360 mL 5   losartan (COZAAR) 25 MG tablet Take 25 mg by mouth daily.     Multiple Vitamin (MULTIVITAMIN WITH MINERALS) TABS Take 1 tablet by mouth daily.     nitroGLYCERIN (NITROSTAT) 0.4 MG SL tablet Place 0.4 mg under the tongue every 5 (five) minutes as needed for chest pain.     ONE TOUCH ULTRA TEST test strip      Tiotropium Bromide Monohydrate (SPIRIVA RESPIMAT) 2.5 MCG/ACT AERS Inhale 2 puffs into the lungs daily. 2 Inhaler 0   traZODone (DESYREL) 100 MG tablet TAKE 1 TABLET BY MOUTH EVERYDAY AT BEDTIME 90 tablet 1   [DISCONTINUED] albuterol (VENTOLIN HFA) 108 (90 Base) MCG/ACT inhaler TAKE 2 PUFFS BY MOUTH EVERY 6 HOURS AS NEEDED FOR WHEEZE OR SHORTNESS OF BREATH  Current Facility-Administered Medications on File Prior to Visit  Medication Dose Route Frequency Provider Last Rate Last Dose   albuterol (PROVENTIL) (2.5 MG/3ML) 0.083% nebulizer solution 2.5 mg  2.5 mg Nebulization Once Delsa Grana, PA-C       Allergies  Allergen Reactions   Actos [Pioglitazone] Swelling    EDEMA REACTION UNSPECIFIED   Avandia [Rosiglitazone]  Swelling    SWELLING REACTION UNSPECIFIED    Dilaudid [Hydromorphone Hcl] Other (See Comments)    Made him crazy   Social History   Socioeconomic History   Marital status: Married    Spouse name: mary lou   Number of children: 3   Years of education: trade    Highest education level: Not on file  Occupational History   Occupation: truck Geophysicist/field seismologist    Comment: retired  Scientist, product/process development strain: Not on file   Food insecurity    Worry: Not on file    Inability: Not on Lexicographer needs    Medical: No    Non-medical: No  Tobacco Use   Smoking status: Current Every Day Smoker    Packs/day: 1.50    Years: 54.00    Pack years: 81.00    Types: Cigarettes   Smokeless tobacco: Never Used   Tobacco comment: 1ppd as of 11/26/17  ep  Substance and Sexual Activity   Alcohol use: No    Comment: no alcolol since 2013   Drug use: Yes    Types: Cocaine, Marijuana    Comment: quit 1990's   Sexual activity: Not Currently  Lifestyle   Physical activity    Days per week: Not on file    Minutes per session: Not on file   Stress: Not on file  Relationships   Social connections    Talks on phone: Not on file    Gets together: Not on file    Attends religious service: Not on file    Active member of club or organization: Not on file    Attends meetings of clubs or organizations: Not on file    Relationship status: Not on file   Intimate partner violence    Fear of current or ex partner: Not on file    Emotionally abused: Not on file    Physically abused: Not on file    Forced sexual activity: Not on file  Other Topics Concern   Not on file  Social History Narrative   Lives in [place] with wife   She is his NOK   Was a truck driver for 32 yr, 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   Family History  Problem Relation Age of Onset   COPD  Mother    Diabetes Mother    Hypertension Mother    Heart attack Father    Diabetes Father    Anesthesia problems Neg Hx    Hypotension Neg Hx    Malignant hyperthermia Neg Hx    Pseudochol deficiency Neg Hx       Review of Systems  All other systems reviewed and are negative.      Objective:   Physical exam could not be performed today as the patient was seen via telephone.  However a Mini-Mental status exam was performed over the telephone.  Patient is actually able to remember 3 objects on recall which he was unable  to do at his last visit.  He missed spell world in reverse inverting the O in the OR but he did get 3 out of 5 points.  We also try to perform serial sevens.  He was only able to perform 1 out of the 5.       Assessment & Plan:  Memory loss  Patient still has impaired short-term memory however he is tolerating the Aricept without difficulty.  Therefore I recommended increasing Aricept to 10 mg a day and reassessing the patient in 6 months.  Fortunately the areas of nodularity seen in the right middle lobe on his last CT in September have completely resolved suggesting an inflammatory or infectious process.  Follow-up as planned with his oncologist in the future.

## 2019-09-16 ENCOUNTER — Telehealth: Payer: Self-pay | Admitting: *Deleted

## 2019-09-16 NOTE — Telephone Encounter (Signed)
Called patient to inform that Shona Simpson would not call him on Monday Nov. 16, scan ordered by another doctor on 09-09-19 and results given to him by the other doctor, I informed patient that he has a CT on 02-02-20 - arrival time- 8:45 am @ [facility], pt. to be NPO- 4 hrs. prior to test, results to be given to patient on 02-03-20 @ 8:30 am via telephone, spoke with patient and he verified understanding all this info. and all appointment dates and times.

## 2019-09-21 ENCOUNTER — Ambulatory Visit: Payer: Self-pay | Admitting: Radiation Oncology

## 2019-10-08 NOTE — Progress Notes (Signed)
Cardiology Office Note:    Date:  10/09/2019   ID:  [name], DOB October 16, 1951, MRN [id number]  PCP:  Keith Frizzle, MD  Cardiologist:  Keith Grooms, MD   Referring MD: Keith Frizzle, MD   Chief Complaint  Patient presents with  . Coronary Artery Disease  . Shortness of Breath    History of Present Illness:    [name] is a 68 y.o. male with a hx of CAD with RCA DES (remote), lung cancer status post partial right lung resection, hypertension, tobacco abuse, COPD, hyperlipidemia, and essential hypertension  He is doing well from a cardiac standpoint.  He denies angina.  Shortness of breath is stable.  Because of his cancer he has lost tremendous weight.  Completed a course of radiation for lung cancer 6 months ago.  He is now in a state of watchful waiting.  He denies palpitations, orthopnea, lower extremity edema, syncope, and exertional angina.  Past Medical History:  Diagnosis Date  . Allergy   . Anemia   . Anginal pain ([place])   . Arthritis    "hands"  . Asthma   . Back pain    4 deteriorating disc and receives an injection q3-31mon;has been doing this for about 103yrs  . Blood transfusion    as a child  . Bronchitis   . Cancer ([place]) dx'd 2013   Non-small cell lung cancer  . Chronic kidney disease    acute kidney failure post surgery  . COPD (chronic obstructive pulmonary disease) (HCC)    uses Albuterol and Spiriva daily  . Coronary artery disease    has 1 stent  . Depression    takes Prozac daily  . Emphysema    sees Dr.Ramaswami for this  . Family history of adverse reaction to anesthesia    mother was hard to wake up sometimes  . GERD (gastroesophageal reflux disease)    takes Prilosec daily, EGD nml 06/2017  . Glaucoma    hx of  . H/O hiatal hernia   . Hypertension    takes  HYzaar daily  . Insomnia    takes Trazodone nightly  . Lung mass    right upper lobe  . Myocardial infarct ([place])   . 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 Obesity   . 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 Periodic limb movements of sleep   . Peripheral neuropathy   . Pneumonia    hx of' 68 yo,rd' last time in 2013  . Productive cough    white in color but no odor  . Shortness of breath    with exertion   . Sleep apnea    uses BiPaP; "no longer have sleep apnea since I have lost over 100 lbs"  . Syncope and collapse 05/26/2013  . Thyroid disease   . Type II diabetes mellitus (HCC)    takes Metformin bid and Novolog and Lantus daily    Past Surgical History:  Procedure Laterality Date  . CARDIAC CATHETERIZATION  2006/2008/2009  . CARPAL TUNNEL RELEASE     bilateral  . COLONOSCOPY    . CORONARY ANGIOPLASTY WITH STENT PLACEMENT     1 stent  . ELBOW SURGERY     ulnar nerve; left  . ESOPHAGOGASTRODUODENOSCOPY    . EYE SURGERY     pt. denies  . FUDUCIAL PLACEMENT Left 08/24/2016   Procedure: PLACEMENT OF FUDUCIAL;  Surgeon: Revonda Standard  Roxan Hockey, MD;  Location: [facility];  Service: Thoracic;  Laterality: Left;  . INGUINAL HERNIA REPAIR  1990's   double inguinal  . LOBECTOMY  03/17/2012   upper right side with resection  . MICROLARYNGOSCOPY WITH CO2 LASER AND EXCISION OF VOCAL CORD LESION N/A 06/28/2017   Procedure: MICROLARYNGOSCOPY WITH BIOPSY;  Surgeon: Melida Quitter, MD;  Location: [facility];  Service: ENT;  Laterality: N/A;  . MICROLARYNGOSCOPY WITH CO2 LASER AND EXCISION OF VOCAL CORD LESION N/A 07/19/2017   Procedure: MICROLARYNGOSCOPY WITH CO2 LASER AND EXCISION OF VOCAL CORD LESION;  Surgeon: Melida Quitter, MD;  Location: [facility];  Service: ENT;  Laterality: N/A;  MICRO DIRECT LARYNGOSCOPY WITH CO2 LASER VOCAL CORD STRIPPING/POSS JET VENTURI VENTILATION  . REFRACTIVE SURGERY     bilaterally  . ROTATOR CUFF REPAIR     left  . VIDEO BRONCHOSCOPY WITH ENDOBRONCHIAL NAVIGATION N/A 08/24/2016   Procedure: VIDEO BRONCHOSCOPY WITH ENDOBRONCHIAL NAVIGATION;  Surgeon: Melrose Nakayama, MD;  Location: [facility];  Service:  Thoracic;  Laterality: N/A;    Current Medications: Current Meds  Medication Sig  . acetaminophen (TYLENOL) 500 MG tablet Take 1,000 mg by mouth every 6 (six) hours as needed for moderate pain.  Marland Kitchen atorvastatin (LIPITOR) 40 MG tablet Take 40 mg by mouth daily.  Marland Kitchen diltiazem (CARDIZEM CD) 120 MG 24 hr capsule TAKE 1 CAPSULE BY MOUTH EVERY DAY  . donepezil (ARICEPT) 10 MG tablet Take 1 tablet (10 mg total) by mouth at bedtime.  Marland Kitchen FLUoxetine (PROZAC) 40 MG capsule TAKE 1 CAPSULE BY MOUTH EVERY DAY  . fluticasone (FLONASE) 50 MCG/ACT nasal spray Place 2 sprays into both nostrils as needed.   . gabapentin (NEURONTIN) 300 MG capsule Take 1 capsule (300 mg total) by mouth 3 (three) times daily.  Marland Kitchen ipratropium-albuterol (DUONEB) 0.5-2.5 (3) MG/3ML SOLN USE 1 VIAL IN NEBULIZER 4 TIMES A DAY  . losartan (COZAAR) 25 MG tablet Take 25 mg by mouth daily.  . Multiple Vitamin (MULTIVITAMIN WITH MINERALS) TABS Take 1 tablet by mouth daily.  . nitroGLYCERIN (NITROSTAT) 0.4 MG SL tablet Place 0.4 mg under the tongue every 5 (five) minutes as needed for chest pain.  . ONE TOUCH ULTRA TEST test strip   . Tiotropium Bromide Monohydrate (SPIRIVA RESPIMAT) 2.5 MCG/ACT AERS Inhale 2 puffs into the lungs daily.  . traZODone (DESYREL) 100 MG tablet TAKE 1 TABLET BY MOUTH EVERYDAY AT BEDTIME  . [DISCONTINUED] albuterol (VENTOLIN HFA) 108 (90 Base) MCG/ACT inhaler TAKE 2 PUFFS BY MOUTH EVERY 6 HOURS AS NEEDED FOR WHEEZE OR SHORTNESS OF BREATH   Current Facility-Administered Medications for the 10/09/19 encounter (Office Visit) with Belva Crome, MD  Medication  . albuterol (PROVENTIL) (2.5 MG/3ML) 0.083% nebulizer solution 2.5 mg     Allergies:   Actos [pioglitazone], Avandia [rosiglitazone], and Dilaudid [hydromorphone hcl]   Social History   Socioeconomic History  . Marital status: Married    Spouse name: mary lou  . Number of children: 3  . Years of education: trade   . Highest education level: Not on file   Occupational History  . 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 Financial resource strain: Not on file  . Food insecurity    Worry: Not on file    Inability: Not on file  . Transportation needs    Medical: No    Non-medical: No  Tobacco Use  . Smoking status: Current Every Day Smoker    Packs/day: 1.50  Years: 54.00    Pack years: 81.00    Types: Cigarettes  . Smokeless tobacco: Never Used  . Tobacco comment: 1ppd as of 11/26/17  ep  Substance and Sexual Activity  . Alcohol use: No    Comment: no alcolol since 2013  . Drug use: Yes    Types: Cocaine, Marijuana    Comment: quit 1990's  . Sexual activity: Not Currently  Lifestyle  . Physical activity    Days per week: Not on file    Minutes per session: Not on file  . Stress: Not on file  Relationships  . Social Herbalist on phone: Not on file    Gets together: Not on file    Attends religious service: Not on file    Active member of club or organization: Not on file    Attends meetings of clubs or organizations: Not on file    Relationship status: Not on file  Other Topics Concern  . 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     Family History: The patient's family history includes COPD in his mother; Diabetes in his father and mother; Heart attack in his father; Hypertension in his mother. There is no history of Anesthesia problems, Hypotension, Malignant hyperthermia, or Pseudochol deficiency.  ROS:   Please see the history of present illness.    Significant hoarseness.  Decreased taste, all which he feels are related to radiation.  Very thick sputum but sometimes causes him to choke.  Weight loss is now stable.  He still smokes 1 pack/day and was previously a 3  pack/day smoker.  Tells me he cannot stop smoking.  All other systems reviewed and are negative.  EKGs/Labs/Other Studies Reviewed:    The following studies were reviewed today: No new or recent imaging data.  EKG:  EKG normal sinus rhythm with normal appearance.  PACs is noted.  Recent Labs: 11/10/2018: Hemoglobin 13.9; Platelets 148 12/24/2018: TSH 3.168 08/11/2019: ALT 10; BUN 34; Creat 1.37; Potassium 5.4; Sodium 137  Recent Lipid Panel    Component Value Date/Time   CHOL 142 08/11/2019 0858   TRIG 77 08/11/2019 0858   HDL 44 08/11/2019 0858   CHOLHDL 3.2 08/11/2019 0858   VLDL 20 05/10/2017 0850   LDLCALC 82 08/11/2019 0858    Physical Exam:    VS:  BP 132/62   Pulse 71   Ht 5\' 11"  (1.803 m)   Wt 179 lb (81.2 kg)   SpO2 93%   BMI 24.97 kg/m     Wt Readings from Last 3 Encounters:  10/09/19 179 lb (81.2 kg)  08/11/19 174 lb (78.9 kg)  01/13/19 176 lb 8 oz (80.1 kg)     GEN: In comparison to baseline, significant weight loss is noted.. No acute distress HEENT: Significant hoarseness NECK: No JVD. LYMPHATICS: No lymphadenopathy CARDIAC:  RRR without murmur, gallop, or edema. VASCULAR:  Normal Pulses. No bruits. RESPIRATORY:  Clear to auscultation without rales, wheezing or rhonchi  ABDOMEN: Soft, non-tender, non-distended, No pulsatile mass, MUSCULOSKELETAL: No deformity  SKIN: Warm and dry NEUROLOGIC:  Alert and oriented x 3 PSYCHIATRIC:  Normal affect   ASSESSMENT:    1. Coronary artery  disease involving native coronary artery of native heart without angina pectoris   2. Neurocardiogenic syncope   3. Essential hypertension   4. OSA on CPAP   5. Non-small cell carcinoma of lung, stage 1 ([place])   6. Educated about COVID-19 virus infection    PLAN:    In order of problems listed above:  1. Secondary prevention is discussed. 2. No recurrent syncope 3. Excellent blood pressure control 4. Encouraged compliance with CPAP.  After significant weight loss,  his breathing is much improved. 5. Lung cancer in remission.  Last radiation 6 months ago.  Still smoking and cannot commit to a stop date. 6. He is observing the 3W's to avoid COVID-19 infection.  I encouraged that he participate in more physical activity to give additional risk protection.  Overall education and awareness concerning /secondary risk prevention was discussed in detail: LDL less than 70, hemoglobin A1c less than 7, blood pressure target less than 130/80 mmHg, >150 minutes of moderate aerobic activity per week, avoidance of smoking, weight control (via diet and exercise), and continued surveillance/management of/for obstructive sleep apnea.    Medication Adjustments/Labs and Tests Ordered: Current medicines are reviewed at length with the patient today.  Concerns regarding medicines are outlined above.  Orders Placed This Encounter  Procedures  . EKG 12-Lead   No orders of the defined types were placed in this encounter.   Patient Instructions  Medication Instructions:  Your physician recommends that you continue on your current medications as directed. Please refer to the Current Medication list given to you today.  *If you need a refill on your cardiac medications before your next appointment, please call your pharmacy*  Lab Work: None If you have labs (blood work) drawn today and your tests are completely normal, you will receive your results only by: Marland Kitchen MyChart Message (if you have MyChart) OR . A paper copy in the mail If you have any lab test that is abnormal or we need to change your treatment, we will call you to review the results.  Testing/Procedures: None  Follow-Up: At [facility], you and your health needs are our priority.  As part of our continuing mission to provide you with exceptional heart care, we have created designated Provider Care Teams.  These Care Teams include your primary Cardiologist (physician) and Advanced Practice Providers (APPs -   Physician Assistants and Nurse Practitioners) who all work together to provide you with the care you need, when you need it.  Your next appointment:   12 month(s)  The format for your next appointment:   In Person  Provider:   You may see Keith Grooms, MD or one of the following Advanced Practice Providers on your designated Care Team:    Truitt Merle, NP  Cecilie Kicks, NP  Kathyrn Drown, NP   Other Instructions      Signed, Keith Grooms, MD  10/09/2019 10:58 AM    [place]

## 2019-10-09 ENCOUNTER — Other Ambulatory Visit: Payer: Self-pay

## 2019-10-09 ENCOUNTER — Encounter: Payer: Self-pay | Admitting: Interventional Cardiology

## 2019-10-09 ENCOUNTER — Ambulatory Visit (INDEPENDENT_AMBULATORY_CARE_PROVIDER_SITE_OTHER): Payer: PPO | Admitting: Interventional Cardiology

## 2019-10-09 VITALS — BP 132/62 | HR 71 | Ht 71.0 in | Wt 179.0 lb

## 2019-10-09 DIAGNOSIS — I2510 Atherosclerotic heart disease of native coronary artery without angina pectoris: Secondary | ICD-10-CM | POA: Diagnosis not present

## 2019-10-09 DIAGNOSIS — I10 Essential (primary) hypertension: Secondary | ICD-10-CM | POA: Diagnosis not present

## 2019-10-09 DIAGNOSIS — Z7189 Other specified counseling: Secondary | ICD-10-CM | POA: Diagnosis not present

## 2019-10-09 DIAGNOSIS — C3411 Malignant neoplasm of upper lobe, right bronchus or lung: Secondary | ICD-10-CM | POA: Diagnosis not present

## 2019-10-09 DIAGNOSIS — Z9989 Dependence on other enabling machines and devices: Secondary | ICD-10-CM

## 2019-10-09 DIAGNOSIS — R55 Syncope and collapse: Secondary | ICD-10-CM

## 2019-10-09 DIAGNOSIS — G4733 Obstructive sleep apnea (adult) (pediatric): Secondary | ICD-10-CM | POA: Diagnosis not present

## 2019-10-09 NOTE — Patient Instructions (Signed)

## 2019-10-16 ENCOUNTER — Ambulatory Visit: Payer: PPO | Admitting: Podiatry

## 2019-10-16 ENCOUNTER — Encounter: Payer: Self-pay | Admitting: Podiatry

## 2019-10-16 ENCOUNTER — Other Ambulatory Visit: Payer: Self-pay

## 2019-10-16 DIAGNOSIS — E1151 Type 2 diabetes mellitus with diabetic peripheral angiopathy without gangrene: Secondary | ICD-10-CM

## 2019-10-16 DIAGNOSIS — M79676 Pain in unspecified toe(s): Secondary | ICD-10-CM

## 2019-10-16 DIAGNOSIS — B351 Tinea unguium: Secondary | ICD-10-CM

## 2019-10-16 NOTE — Progress Notes (Signed)
Complaint:  Visit Type: Patient returns to my office for continued preventative foot care services. Complaint: Patient states" my nails have grown long and thick and become painful to walk and wear shoes" Patient has been diagnosed with DM with neuropathy.The patient presents for preventative foot care services. No changes to ROS.  Patient has history of throat cancer.  Podiatric Exam: Vascular: dorsalis pedis and posterior tibial pulses are diminished but  palpable bilateral. Capillary return is immediate. Temperature gradient is WNL. Skin turgor WNL  Sensorium: Diminished  Semmes Weinstein monofilament test. Normal tactile sensation bilaterally. Nail Exam: Pt has thick disfigured discolored nails with subungual debris noted bilateral entire nail hallux through fifth toenails Ulcer Exam: There is no evidence of ulcer or pre-ulcerative changes or infection. Orthopedic Exam: Muscle tone and strength are WNL. No limitations in general ROM. No crepitus or effusions noted. Foot type and digits show no abnormalities. HAV  B/L.    Skin: No Porokeratosis. No infection or ulcers  Diagnosis:  Onychomycosis, , Pain in right toe, pain in left toes  Treatment & Plan Procedures and Treatment: Consent by patient was obtained for treatment procedures.   Debridement of mycotic and hypertrophic toenails, 1 through 5 bilateral and clearing of subungual debris. No ulceration, no infection noted. Return Visit-Office Procedure: Patient instructed to return to the office for a follow up visit 3 months for continued evaluation and treatment.    Gardiner Barefoot DPM

## 2019-10-31 ENCOUNTER — Other Ambulatory Visit: Payer: Self-pay | Admitting: Family Medicine

## 2019-11-02 NOTE — Telephone Encounter (Signed)
Last office visit: 09/11/2019 Last refilled: 01/23/2019

## 2019-11-06 ENCOUNTER — Other Ambulatory Visit: Payer: Self-pay | Admitting: Family Medicine

## 2019-12-11 DIAGNOSIS — J384 Edema of larynx: Secondary | ICD-10-CM | POA: Diagnosis not present

## 2019-12-11 DIAGNOSIS — Z9089 Acquired absence of other organs: Secondary | ICD-10-CM | POA: Diagnosis not present

## 2019-12-11 DIAGNOSIS — Z972 Presence of dental prosthetic device (complete) (partial): Secondary | ICD-10-CM | POA: Diagnosis not present

## 2019-12-11 DIAGNOSIS — Z8521 Personal history of malignant neoplasm of larynx: Secondary | ICD-10-CM | POA: Diagnosis not present

## 2019-12-11 DIAGNOSIS — F17200 Nicotine dependence, unspecified, uncomplicated: Secondary | ICD-10-CM | POA: Diagnosis not present

## 2019-12-23 ENCOUNTER — Other Ambulatory Visit: Payer: Self-pay | Admitting: Interventional Cardiology

## 2020-01-15 ENCOUNTER — Ambulatory Visit: Payer: Self-pay | Admitting: Podiatry

## 2020-01-26 ENCOUNTER — Other Ambulatory Visit: Payer: Self-pay | Admitting: Family Medicine

## 2020-02-02 ENCOUNTER — Ambulatory Visit
Admission: RE | Admit: 2020-02-02 | Discharge: 2020-02-02 | Disposition: A | Discharge status: Home / Self Care | Payer: PPO | Source: Ambulatory Visit | Attending: Radiation Oncology | Admitting: Radiation Oncology

## 2020-02-02 ENCOUNTER — Other Ambulatory Visit: Payer: Self-pay

## 2020-02-02 ENCOUNTER — Telehealth: Payer: Self-pay

## 2020-02-02 ENCOUNTER — Encounter: Payer: Self-pay | Admitting: Radiation Oncology

## 2020-02-02 DIAGNOSIS — C3411 Malignant neoplasm of upper lobe, right bronchus or lung: Secondary | ICD-10-CM

## 2020-02-02 DIAGNOSIS — Z08 Encounter for follow-up examination after completed treatment for malignant neoplasm: Secondary | ICD-10-CM | POA: Diagnosis not present

## 2020-02-02 DIAGNOSIS — C320 Malignant neoplasm of glottis: Secondary | ICD-10-CM

## 2020-02-02 DIAGNOSIS — C3412 Malignant neoplasm of upper lobe, left bronchus or lung: Secondary | ICD-10-CM | POA: Diagnosis not present

## 2020-02-02 DIAGNOSIS — Z8521 Personal history of malignant neoplasm of larynx: Secondary | ICD-10-CM | POA: Diagnosis not present

## 2020-02-02 DIAGNOSIS — C3432 Malignant neoplasm of lower lobe, left bronchus or lung: Secondary | ICD-10-CM | POA: Diagnosis not present

## 2020-02-02 DIAGNOSIS — F17210 Nicotine dependence, cigarettes, uncomplicated: Secondary | ICD-10-CM | POA: Diagnosis not present

## 2020-02-02 DIAGNOSIS — Z85118 Personal history of other malignant neoplasm of bronchus and lung: Secondary | ICD-10-CM | POA: Diagnosis not present

## 2020-02-02 MED ORDER — IOPAMIDOL (ISOVUE-300) INJECTION 61%
75.0000 mL | Freq: Once | INTRAVENOUS | Status: AC | PRN
  Administered 2020-02-02: 75 mL via INTRAVENOUS

## 2020-02-02 NOTE — Progress Notes (Signed)
Radiation Oncology         [phone] ________________________________  Outpatient Follow Up - Conducted via telephone due to current COVID-19 concerns for limiting patient exposure  I spoke with the patient to conduct this consult visit via telephone to spare the patient unnecessary potential exposure in the healthcare setting during the current COVID-19 pandemic. The patient was notified in advance and was offered a [place] meeting to allow for face to face communication but unfortunately reported that they did not have the appropriate resources/technology to support such a visit and instead preferred to proceed with a telephone visit.   ________________________________  Name: [name]        MRN: [id number]  Date of Service: 02/02/2020 DOB: 06-Mar-1951  BP:ZWCHENI, Cammie Mcgee, MD  Melida Quitter, MD     REFERRING PHYSICIAN: Melida Quitter, MD   DIAGNOSIS: The primary encounter diagnosis was Non-small cell carcinoma of lung, stage 1 ([place]). A diagnosis of Primary cancer of glottis [facility]) was also pertinent to this visit.   HISTORY OF PRESENT ILLNESS: [name] is a 69 y.o. male with a history of Stage IA, pT1aN0M0 NSCLC, SCC of the RUL s/p lobectomy in 2013 with Dr. [name]. He developed a new nodule in 2017 in the LLL and a biopsy of this site was also consistent with NSCLC, SCC. He received SBRT to this site and was found in surveillance to have a PET positive lesion in the right vocal cord. He had stage I disease and was treated with radiotherapy in 2018. He has been followed in surveillance with Dr. Redmond Baseman and is NED as of August 2020's examination. He was treated in April 2020 for a putative Stage IB lesion in the LUL with radiotherapy and has been followed in surveillance since. Of note he has chronic left chest wall pain for several years since completing his SBRT to the LLL, felt to be post radiotherapy induced chondritis and more recently radiation induced instability  fracture. It was finally noted to be fractured in 2019 by imaging. He also has chronic hoarseness from his prior radiotherapy to the larynx. HE had recent CT scan today to follow up of the chest, and he also continues to see Dr. Redmond Baseman regularly for laryngoscopies.    His CT today showed stability in the two treated sites from prior SBRT, but he does have displaced left 7th rib fracture with associated fluid collection felt to be a probable seroma from the trauma of the displaced fracture. His previously noted mediastinal nodes are now normal range, and the RML opacity has cleared. No new pulmonary nodules are noted.  On review of systems, the patient reports that he is doing well overall. He continues to have hoarseness but denies any progressive symptoms of cough, shortness of breath, fevers, or chills. He denies any night sweats, unintended weight changes. He does continue to have a stabbing and sometimes shooting pain in the left chest wall and is taking neurontin 900 mg q hs, and 300 mg qam. His symptoms seem worse in the morning, and he denies any new symptoms associated.  He has not needed to see pulmonary for quite some time and remains without need for any O2 at this time. He denies any bowel or bladder disturbances, and denies abdominal pain, nausea or vomiting. He denies any other musculoskeletal or joint aches or pains, new skin lesions or concerns. A complete review of systems is obtained and is otherwise negative.   MOST RECENT RADIATION THERAPY:  02/10/2019-02/18/2019 SBRT Treatment: The target in the LUL was treated to 54 Gy in 3 fractions of 18 Gy  08/26/2017 - 10/04/2017: The larynx was treated to 63 Gy in 28 fractions of 2.25 Gy.  09/17/2016 - 09/24/2016 SBRT Treatment: TheLLLtarget was treated to 54Gy in 17fractions of 18Gy  PAST MEDICAL HISTORY:  Past Medical History:  Diagnosis Date  . Allergy   . Anemia   . Anginal pain ([place])   . Arthritis    "hands"  . Asthma    . Back pain    4 deteriorating disc and receives an injection q3-74mon;has been doing this for about 24yrs  . Blood transfusion    as a child  . Bronchitis   . Cancer ([place]) dx'd 2013   Non-small cell lung cancer  . Chronic kidney disease    acute kidney failure post surgery  . COPD (chronic obstructive pulmonary disease) (HCC)    uses Albuterol and Spiriva daily  . Coronary artery disease    has 1 stent  . Depression    takes Prozac daily  . Emphysema    sees Dr.Ramaswami for this  . Family history of adverse reaction to anesthesia    mother was hard to wake up sometimes  . GERD (gastroesophageal reflux disease)    takes Prilosec daily, EGD nml 06/2017  . Glaucoma    hx of  . H/O hiatal hernia   . Hypertension    takes  HYzaar daily  . Insomnia    takes Trazodone nightly  . Lung mass    right upper lobe  . Myocardial infarct ([place])   . 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 Obesity   . 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 Periodic limb movements of sleep   . Peripheral neuropathy   . Pneumonia    hx of' 69 yo,rd' last time in 2013  . Productive cough    white in color but no odor  . Shortness of breath    with exertion   . Sleep apnea    uses BiPaP; "no longer have sleep apnea since I have lost over 100 lbs"  . Syncope and collapse 05/26/2013  . Thyroid disease   . Type II diabetes mellitus (HCC)    takes Metformin bid and Novolog and Lantus daily       PAST SURGICAL HISTORY: Past Surgical History:  Procedure Laterality Date  . CARDIAC CATHETERIZATION  2006/2008/2009  . CARPAL TUNNEL RELEASE     bilateral  . COLONOSCOPY    . CORONARY ANGIOPLASTY WITH STENT PLACEMENT     1 stent  . ELBOW SURGERY     ulnar nerve; left  . ESOPHAGOGASTRODUODENOSCOPY    . EYE SURGERY     pt. denies  . FUDUCIAL PLACEMENT Left 08/24/2016   Procedure: PLACEMENT OF FUDUCIAL;  Surgeon: Melrose Nakayama, MD;  Location: [facility];  Service: Thoracic;   Laterality: Left;  . INGUINAL HERNIA REPAIR  1990's   double inguinal  . LOBECTOMY  03/17/2012   upper right side with resection  . MICROLARYNGOSCOPY WITH CO2 LASER AND EXCISION OF VOCAL CORD LESION N/A 06/28/2017   Procedure: MICROLARYNGOSCOPY WITH BIOPSY;  Surgeon: Melida Quitter, MD;  Location: [facility];  Service: ENT;  Laterality: N/A;  . MICROLARYNGOSCOPY WITH CO2 LASER AND EXCISION OF VOCAL CORD LESION N/A 07/19/2017   Procedure: MICROLARYNGOSCOPY WITH CO2 LASER AND EXCISION OF VOCAL CORD LESION;  Surgeon: Melida Quitter, MD;  Location: [facility]  OR;  Service: ENT;  Laterality: N/A;  MICRO DIRECT LARYNGOSCOPY WITH CO2 LASER VOCAL CORD STRIPPING/POSS JET VENTURI VENTILATION  . REFRACTIVE SURGERY     bilaterally  . ROTATOR CUFF REPAIR     left  . VIDEO BRONCHOSCOPY WITH ENDOBRONCHIAL NAVIGATION N/A 08/24/2016   Procedure: VIDEO BRONCHOSCOPY WITH ENDOBRONCHIAL NAVIGATION;  Surgeon: Melrose Nakayama, MD;  Location: [facility];  Service: Thoracic;  Laterality: N/A;     FAMILY HISTORY:  Family History  Problem Relation Age of Onset  . COPD Mother   . Diabetes Mother   . Hypertension Mother   . Heart attack Father   . Diabetes Father   . Anesthesia problems Neg Hx   . Hypotension Neg Hx   . Malignant hyperthermia Neg Hx   . Pseudochol deficiency Neg Hx      SOCIAL HISTORY:  reports that he has been smoking cigarettes. He has a 81.00 pack-year smoking history. He has never used smokeless tobacco. He reports current drug use. Drugs: Cocaine and Marijuana. He reports that he does not drink alcohol. The patient is married and lives in [place].    ALLERGIES: Actos [pioglitazone], Avandia [rosiglitazone], and Dilaudid [hydromorphone hcl]   MEDICATIONS:  Current Outpatient Medications  Medication Sig Dispense Refill  . acetaminophen (TYLENOL) 500 MG tablet Take 1,000 mg by mouth every 6 (six) hours as needed for moderate pain.    Marland Kitchen atorvastatin (LIPITOR) 40 MG tablet Take 40 mg by mouth daily.     Marland Kitchen diltiazem (CARDIZEM CD) 120 MG 24 hr capsule TAKE 1 CAPSULE BY MOUTH EVERY DAY 90 capsule 3  . donepezil (ARICEPT) 10 MG tablet Take 1 tablet (10 mg total) by mouth at bedtime. 90 tablet 3  . donepezil (ARICEPT) 5 MG tablet TAKE 1 TABLET BY MOUTH EVERYDAY AT BEDTIME (Patient not taking: Reported on 02/02/2020) 90 tablet 3  . FLUoxetine (PROZAC) 40 MG capsule TAKE 1 CAPSULE BY MOUTH EVERY DAY 90 capsule 3  . fluticasone (FLONASE) 50 MCG/ACT nasal spray Place 2 sprays into both nostrils as needed.   5  . gabapentin (NEURONTIN) 300 MG capsule TAKE 1 CAPSULE BY MOUTH THREE TIMES A DAY 270 capsule 2  . glucose blood (ONETOUCH ULTRA) test strip TEST ONCE DAILY E11.42    . ipratropium-albuterol (DUONEB) 0.5-2.5 (3) MG/3ML SOLN USE 1 VIAL IN NEBULIZER 4 TIMES A DAY 360 mL 5  . losartan (COZAAR) 25 MG tablet Take 25 mg by mouth daily.    . Multiple Vitamin (MULTIVITAMIN WITH MINERALS) TABS Take 1 tablet by mouth daily.    . nitroGLYCERIN (NITROSTAT) 0.4 MG SL tablet Place 0.4 mg under the tongue every 5 (five) minutes as needed for chest pain.    . ONE TOUCH ULTRA TEST test strip     . Tiotropium Bromide Monohydrate (SPIRIVA RESPIMAT) 2.5 MCG/ACT AERS Inhale 2 puffs into the lungs daily. (Patient not taking: Reported on 02/02/2020) 2 Inhaler 0  . traZODone (DESYREL) 100 MG tablet TAKE 1 TABLET BY MOUTH EVERYDAY AT BEDTIME 90 tablet 1   Current Facility-Administered Medications  Medication Dose Route Frequency Provider Last Rate Last Admin  . albuterol (PROVENTIL) (2.5 MG/3ML) 0.083% nebulizer solution 2.5 mg  2.5 mg Nebulization Once Delsa Grana, PA-C         REVIEW OF SYSTEMS: On review of systems, the patient reports that he is doing well overall. He has noticed some low chest wall pain on the left recently. He denies any sternal chest pain, shortness of breath, fevers, chills,  night sweats, unintended weight changes. He is no longer on O2, and reports he's been off for about 2 years. He has  noticed a recent productive cough with congestion but denies any known Covid exposures, and has not been out of the house with the exception of doctor appointments.  He denies any bowel or bladder disturbances, and denies abdominal pain, nausea or vomiting. He denies any other new musculoskeletal or joint aches or pains. A complete review of systems is obtained and is otherwise negative.     PHYSICAL EXAM:  Unable to assess due to encounter type.  ECOG = 1  0 - Asymptomatic (Fully active, able to carry on all predisease activities without restriction)  1 - Symptomatic but completely ambulatory (Restricted in physically strenuous activity but ambulatory and able to carry out work of a light or sedentary nature. For example, light housework, office work)  2 - Symptomatic, <50% in bed during the day (Ambulatory and capable of all self care but unable to carry out any work activities. Up and about more than 50% of waking hours)  3 - Symptomatic, >50% in bed, but not bedbound (Capable of only limited self-care, confined to bed or chair 50% or more of waking hours)  4 - Bedbound (Completely disabled. Cannot carry on any self-care. Totally confined to bed or chair)  5 - Death   Eustace Pen MM, Creech RH, Tormey DC, et al. [phone]). "Toxicity and response criteria of the [facility]". [place] Oncol. 5 (6): 649-55    LABORATORY DATA:  Lab Results  Component Value Date   WBC 6.6 11/10/2018   HGB 13.9 11/10/2018   HCT 41.1 11/10/2018   MCV 94.9 11/10/2018   PLT 148 11/10/2018   Lab Results  Component Value Date   NA 137 08/11/2019   K 5.4 (H) 08/11/2019   CL 102 08/11/2019   CO2 28 08/11/2019   Lab Results  Component Value Date   ALT 10 08/11/2019   AST 13 08/11/2019   ALKPHOS 108 04/01/2018   BILITOT 0.6 08/11/2019      RADIOGRAPHY: CT CHEST W CONTRAST  Addendum Date: 02/02/2020   ADDENDUM REPORT: 02/02/2020 11:53 ADDENDUM: This case was subsequently  discussed by telephone with Worthy Flank PA. She reports the patient's pain is in the region of the posterior left 7th rib fracture. The radiation in this area was in 2017. By pathologic, I meant an insufficiency fracture related to prior radiation therapy, and there are no specific signs of local tumor recurrence in this area. The fluid collection could potentially be infected. We discussed management options of short-term (3 month) CT follow-up, PET-CT and percutaneous aspiration. Electronically Signed   By: Richardean Sale M.D.   On: 02/02/2020 11:53   Result Date: 02/02/2020 CLINICAL DATA:  Non-small cell lung cancer post SB RT to the left upper and lower lobes. Left chest pain for 3 weeks. Creatinine was obtained on site at [facility] at [address] Results: Creatinine 1.4 mg/dL. EXAM: CT CHEST WITH CONTRAST TECHNIQUE: Multidetector CT imaging of the chest was performed during intravenous contrast administration. CONTRAST:  29mL ISOVUE-300 IOPAMIDOL (ISOVUE-300) INJECTION 61% COMPARISON:  Chest CT 09/09/2019 and 08/03/2019. PET-CT 01/08/2019. FINDINGS: Cardiovascular: Atherosclerosis of the aorta, great vessels and coronary arteries again noted with irregular mural thrombus in the mid descending aorta. No dissection, aneurysm or acute vascular findings. There are calcifications of the aortic valve. The heart size is normal. There is no pericardial effusion. Mediastinum/Nodes: There  are no enlarged mediastinal, hilar or axillary lymph nodes.Low right paratracheal node measures 10 mm on image 49/2, previously 13 mm. There are small calcified right hilar lymph nodes. The thyroid gland, trachea and esophagus demonstrate no significant findings. Lungs/Pleura: No pleural effusion or pneumothorax. Moderate centrilobular and paraseptal emphysema again noted with stable postsurgical distortion of the right hilum related to previous right upper lobe resection. There is stable mild pleural thickening  along the superior aspect of the right minor fissure. 8 mm treated right upper lobe lesion (image 25/3) and surrounding radiation changes are stable. Post treatment changes posteriorly superior segment of the left lower lobe and adjacent fiducial clips are unchanged. No new or enlarging pulmonary nodules. Upper abdomen: The visualized upper abdomen appears stable without acute findings. There is bilateral adrenal hyperplasia. There are bilateral renal cysts. Musculoskeletal/Chest wall: There is a chronic pathologic fracture of the left 7th rib posteriorly adjacent to the radiation changes (image 66/3). This shows slightly increased displacement compared with the most recent CT, and there is an adjacent small fluid collection in the chest wall measuring up to 2.3 x 1.4 cm on image 72/2. No rib destruction or apparent soft tissue mass to suggest local recurrence. No suspicious osseous findings. Remote thoracotomy defect involving the right 6th rib laterally. Bilateral gynecomastia. IMPRESSION: 1. Stable postsurgical and radiation changes in the lungs. No definite local recurrence or metastatic disease. 2. Chronic pathologic fracture of the left 7th rib with adjacent chest wall fluid collection, likely a seroma. This finding may contribute to the patient's left chest pain. There is no rib destruction or soft tissue mass to suggest local recurrence. Attention on follow-up recommended. 3. Stable scattered small pulmonary nodules bilaterally. 4. Aortic Atherosclerosis (ICD10-I70.0) and Emphysema (ICD10-J43.9). Electronically Signed: By: Richardean Sale M.D. On: 02/02/2020 11:22       IMPRESSION/PLAN: 1.  Putative Stage IB NSCLC in the LUL. The patient continue to be without progressive or recurrent disease radiographically. He will continue with routine surveillance CTs per NCCN guidelines. 2.  Stage IA, cT1aN0M0,NSCLC,squamous cell carcinoma of the left lower lobe of lung and history of Stage IA, pT1aN0M0, NSCLC,  squamous cell carcinoma of the RUL. These sites remain without active disease and will be followed expectantly during surveillance scans for #1 3.  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 The patient continues to follow with Dr. Redmond Baseman and has been NED since the radiotherapy treatment. He continues q6 months for laryngoscopic examinations.  4. Fracture of the left 7th rib.Though the patient has had symptoms of left chest wall pain for several years, it wasn't until his scans in the fall of 2020 that true fracture was seen radiographically. He does have some new inflammatory appearing changes in the imaging and after speaking with the reading radiologist, it is NOT felt to be pathologic from cancer, but chronic changes and inflammatory change from the displacement from his fracture. The patient remains symptomatic and cannot take NSAIDs due to his renal insufficiency. Rather we recommended increasing his neurontin which he's taking 300 mg q am, and 900 mg qHS. His symptoms are worse in the morning so he will increase his am dose to 600 mg.if the inflammatory change, possible seroma increases in a 3 month interval by CT we will explore this further with either IR or CT surgery and he is in agreement. 5. Renal insufficency. The patient's baseline creatinine appears to be between 1.3 and 1.6. He will be followed by PCP for  this but was encouraged to avoid NSAIDs.  Given current concerns for patient exposure during the COVID-19 pandemic, this encounter was conducted via telephone.  The patient has given verbal consent for this type of encounter. The time spent during this encounter was 30 minutes and 50% of that time was spent in the coordination of his care. The attendants for this meeting include Shona Simpson, [facility] and Marylou and  Nash Dimmer During the encounter, Shona Simpson [facility] was located at [facility] Radiation Oncology Department.  Marylou and  MESSIYAH WATERSON  were located at home.    Carola Rhine, PAC

## 2020-02-02 NOTE — Telephone Encounter (Signed)
Called to remind patient of telephone encounter on 02/03/2020 with provider, meaningful use completed to update medications patient is taking. Wife is aware of appointment with provider and verbalized its a telephone encounter

## 2020-02-03 ENCOUNTER — Ambulatory Visit
Admission: RE | Admit: 2020-02-03 | Discharge: 2020-02-03 | Disposition: A | Discharge status: Home / Self Care | Payer: PPO | Source: Ambulatory Visit | Attending: Radiation Oncology | Admitting: Radiation Oncology

## 2020-02-03 DIAGNOSIS — C3432 Malignant neoplasm of lower lobe, left bronchus or lung: Secondary | ICD-10-CM

## 2020-02-19 DIAGNOSIS — N1831 Chronic kidney disease, stage 3a: Secondary | ICD-10-CM | POA: Diagnosis not present

## 2020-02-19 DIAGNOSIS — E1142 Type 2 diabetes mellitus with diabetic polyneuropathy: Secondary | ICD-10-CM | POA: Diagnosis not present

## 2020-03-23 ENCOUNTER — Ambulatory Visit (INDEPENDENT_AMBULATORY_CARE_PROVIDER_SITE_OTHER): Payer: PPO | Admitting: Family Medicine

## 2020-03-23 ENCOUNTER — Other Ambulatory Visit: Payer: Self-pay

## 2020-03-23 ENCOUNTER — Telehealth: Payer: Self-pay | Admitting: Family Medicine

## 2020-03-23 ENCOUNTER — Encounter: Payer: Self-pay | Admitting: Family Medicine

## 2020-03-23 VITALS — BP 140/70 | HR 84 | Temp 97.2°F | Resp 20 | Ht 71.0 in | Wt 164.0 lb

## 2020-03-23 DIAGNOSIS — R197 Diarrhea, unspecified: Secondary | ICD-10-CM | POA: Diagnosis not present

## 2020-03-23 DIAGNOSIS — R112 Nausea with vomiting, unspecified: Secondary | ICD-10-CM | POA: Diagnosis not present

## 2020-03-23 DIAGNOSIS — J418 Mixed simple and mucopurulent chronic bronchitis: Secondary | ICD-10-CM

## 2020-03-23 DIAGNOSIS — E860 Dehydration: Secondary | ICD-10-CM | POA: Diagnosis not present

## 2020-03-23 DIAGNOSIS — R509 Fever, unspecified: Secondary | ICD-10-CM

## 2020-03-23 LAB — COMPREHENSIVE METABOLIC PANEL
AG Ratio: 1.9 (calc) (ref 1.0–2.5)
ALT: 18 U/L (ref 9–46)
AST: 49 U/L — ABNORMAL HIGH (ref 10–35)
Albumin: 4.8 g/dL (ref 3.6–5.1)
Alkaline phosphatase (APISO): 100 U/L (ref 35–144)
BUN/Creatinine Ratio: 25 (calc) — ABNORMAL HIGH (ref 6–22)
BUN: 54 mg/dL — ABNORMAL HIGH (ref 7–25)
CO2: 26 mmol/L (ref 20–32)
Calcium: 9.7 mg/dL (ref 8.6–10.3)
Chloride: 94 mmol/L — ABNORMAL LOW (ref 98–110)
Creat: 2.13 mg/dL — ABNORMAL HIGH (ref 0.70–1.18)
Globulin: 2.5 g/dL (calc) (ref 1.9–3.7)
Glucose, Bld: 130 mg/dL — ABNORMAL HIGH (ref 65–99)
Potassium: 3.9 mmol/L (ref 3.5–5.3)
Sodium: 135 mmol/L (ref 135–146)
Total Bilirubin: 1 mg/dL (ref 0.2–1.2)
Total Protein: 7.3 g/dL (ref 6.1–8.1)

## 2020-03-23 LAB — CBC WITH DIFFERENTIAL/PLATELET
Absolute Monocytes: 1701 cells/uL — ABNORMAL HIGH (ref 200–950)
Basophils Absolute: 42 cells/uL (ref 0–200)
Basophils Relative: 0.2 %
Eosinophils Absolute: 0 cells/uL — ABNORMAL LOW (ref 15–500)
Eosinophils Relative: 0 %
HCT: 43.1 % (ref 38.5–50.0)
Hemoglobin: 14.4 g/dL (ref 13.2–17.1)
Lymphs Abs: 1365 cells/uL (ref 850–3900)
MCH: 31.5 pg (ref 27.0–33.0)
MCHC: 33.4 g/dL (ref 32.0–36.0)
MCV: 94.3 fL (ref 80.0–100.0)
MPV: 10.6 fL (ref 7.5–12.5)
Monocytes Relative: 8.1 %
Neutro Abs: 17892 cells/uL — ABNORMAL HIGH (ref 1500–7800)
Neutrophils Relative %: 85.2 %
Platelets: 220 10*3/uL (ref 140–400)
RBC: 4.57 10*6/uL (ref 4.20–5.80)
RDW: 12.9 % (ref 11.0–15.0)
Total Lymphocyte: 6.5 %
WBC: 21 10*3/uL — ABNORMAL HIGH (ref 3.8–10.8)

## 2020-03-23 LAB — URINALYSIS, ROUTINE W REFLEX MICROSCOPIC
Bilirubin Urine: NEGATIVE
Glucose, UA: NEGATIVE
Leukocytes,Ua: NEGATIVE
Nitrite: NEGATIVE
Specific Gravity, Urine: 1.02 (ref 1.001–1.03)
Squamous Epithelial / HPF: NONE SEEN /HPF (ref <=5)
WBC, UA: NONE SEEN /HPF (ref 0–5)
pH: 5 (ref 5.0–8.0)

## 2020-03-23 LAB — LIPASE: Lipase: 30 U/L (ref 7–60)

## 2020-03-23 LAB — MICROSCOPIC MESSAGE

## 2020-03-23 MED ORDER — PREDNISONE 20 MG PO TABS: 40.0000 mg | ORAL_TABLET | Freq: Every day | ORAL | 0 refills | Status: DC

## 2020-03-23 MED ORDER — ONDANSETRON HCL 4 MG PO TABS: 4.0000 mg | ORAL_TABLET | Freq: Three times a day (TID) | ORAL | 0 refills | Status: DC | PRN

## 2020-03-23 MED ORDER — LEVOFLOXACIN 500 MG PO TABS: 500.0000 mg | ORAL_TABLET | Freq: Every day | ORAL | 0 refills | Status: DC

## 2020-03-23 NOTE — Assessment & Plan Note (Addendum)
Patient symptoms are concerning.  Significant past medical history with immunocompromise state.  He has COPD symptoms but also underlying viral at [facility] symptoms with regards to the gastroenteritis.  He has also history of recurrent pneumonia and this is also on differential.  He refuses to go to the emergency room though I think this will be beneficial to him.  Instead I went ahead and gave him 1 L normal saline via IV due to the dehydration.  Stat labs were drawn.  Advised him if his labs come back significantly abnormal I will again recommend that he go to the emergency room to be treated.  I also discussed this with his wife.  For nausea we will start him on Zofran ODT and see if that helps get food down or at least Ensure/boost  He has not been able to tolerate any of his oral medications the past 2 days.  I will presumptively go ahead and start him on Levaquin 500 mg once a day in the setting of his renal insufficiency. Prednisone 40mg  once a day for 5 days  Note he has had COVID-19 vaccine with office culminating symptoms and his underlying medical problems we will swab him to make sure he is not positive in the setting of these new variants.

## 2020-03-23 NOTE — Progress Notes (Addendum)
Subjective:    Patient ID: [name], male    DOB: [birth year], 69 y.o.   MRN: [id number]  Patient presents for Diarrhea and chest congestion with thick mucus and cough  Pt here with diarrhea, N/V , chest congestion , cough and fever per report , that started Monday. Over the weekend he felt fine., No known sick contacts. No sinus drainage. He feels nauseous as soon as he tries to eat. Has not had anything solid since Monday. 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  He has some soreness in the left side of his chest with coughing at times,  He states he is urinating normally   No other family members are sick   CAD -  But no CHF history  Endocrinology- Dr. Buddy Duty   No recent labs available for review   COVID-19 vaccine UTD  Done in March    Note pt has chronic fracture of left 7th rb, noted on his CT chest, has chronic left sided chest discomfort documented in chart    Review Of Systems:  GEN- +fatigue, fever, weight loss,weakness, recent illness HEENT- denies eye drainage, change in vision, nasal discharge, CVS- denies chest pain, palpitations RESP- denies SOB, +cough,+ wheeze ABD- + N/V,+ change in stools, abd pain GU- denies dysuria, hematuria, dribbling, incontinence MSK- denies joint pain, muscle aches, injury Neuro- denies headache, dizziness, syncope, seizure activity       Objective:    BP 140/70   Pulse 84   Temp (!) 97.2 F (36.2 C) (Other (Comment))   Resp 20   Ht 5\' 11"  (1.803 m)   Wt 164 lb (74.4 kg)   SpO2 94%   BMI 22.87 kg/m  GEN- NAD, alert and oriented x3, no acute distress, sitting comfortably   HEENT- PERRL, EOMI, non injected sclera, pink conjunctiva, MMM, oropharynx clear, no sinus tenderness, Uvula midline  Neck- Supple, no LAD CVS- RRR, no murmur , mild TTP left lower chest wall  RESP-CTAB, no wheeze, normal WOB, decrease at bases ABD-NABS,soft,mild TTP RUQ, no rebound,no guarding, No CVA tenderness EXT- No edema Pulses- Radial 2+        Assessment & Plan:    I spoke with patient and his wife.  His white blood cell count is extremely elevated 21,000 he is also an acute on chronic renal failure.  He has not had anything solid in the past 3 days only sips of water.  We did give him IV fluids in the office but based on his past medical history with cancer weight loss and other comorbidities he really needs to be evaluated in the emergency room.  I think that he needs chest x-ray done he may even need some imaging of his abdomen the setting of that right upper quadrant discomfort and the vomiting and diarrhea.  After further explanation patient agrees to go to the hospital he will go to [facility] as this is closest to him.   Problem List Items Addressed This Visit      Unprioritized   Chronic obstructive pulmonary disease ([place])  Patient symptoms are concerning.  Significant past medical history with immunocompromise state.  He has COPD symptoms but also underlying viral at theology symptoms with regards to the gastroenteritis.  He has also history of recurrent pneumonia and this is also on differential.  He refuses to go to the emergency room though I think this will be beneficial to him.  Instead I went ahead and gave him 1 L normal saline via IV due to the dehydration.  Stat labs were drawn.  Advised him if his labs come back significantly abnormal I will again recommend that he go to the emergency room to be treated.  I also discussed this with his wife.  For nausea we will start him on Zofran ODT and see if that helps get food down or at least Ensure/boost  He has not been  able to tolerate any of his oral medications the past 2 days.  I will presumptively go ahead and start him on Levaquin 500 mg once a day in the setting of his renal insufficiency. Prednisone 40mg  once a day for 5 days  Note he has had COVID-19 vaccine with office culminating symptoms and his underlying medical problems we will swab him to make sure he is not positive in the setting of these new variants.      Relevant Medications   predniSONE (DELTASONE) 20 MG tablet   Other Relevant Orders   SARS-COV-2 RNA,(COVID-19) QUAL NAAT    Other Visit Diagnoses    Nausea and vomiting, intractability of vomiting not specified, unspecified vomiting type    -  Primary   Relevant Orders   CBC with Differential/Platelet (Completed)   Comprehensive metabolic panel (Completed)   Lipase (Completed)   SARS-COV-2 RNA,(COVID-19) QUAL NAAT   Urinalysis, Routine w reflex microscopic (Completed)   Microscopic Message (Completed)   Diarrhea, unspecified type       Relevant Orders   Microscopic Message (Completed)   Dehydration       Relevant Orders   CBC with Differential/Platelet (Completed)   Comprehensive metabolic panel (Completed)   Lipase (Completed)   SARS-COV-2 RNA,(COVID-19) QUAL NAAT   Fever, unspecified fever cause       Relevant Orders   SARS-COV-2 RNA,(COVID-19) QUAL NAAT   Urinalysis, Routine w reflex microscopic (Completed)      Note: This dictation was prepared with Dragon dictation along with smaller phrase technology. Any transcriptional errors that result from this process are unintentional.

## 2020-03-23 NOTE — Patient Instructions (Signed)
We will call with lab results this evening Take zofran Take antibiotics LEVAQUIN and prednisone  You can take robitussin or coricidan for cough

## 2020-03-23 NOTE — Telephone Encounter (Signed)
Pt's wife called and states that pt has been sick for the past few days with N&V, Diarrhea, fever and trouble getting mucus up and out of his lungs. Recommended that he go to the ER however pt refused. Informed that we could put him down as a phone visit  And if he gets worse waiting on his apt then he can go to the ER. Pt's wife verbalizes understanding.

## 2020-03-24 ENCOUNTER — Other Ambulatory Visit: Payer: Self-pay

## 2020-03-24 ENCOUNTER — Encounter (HOSPITAL_COMMUNITY): Payer: Self-pay

## 2020-03-24 ENCOUNTER — Emergency Department (HOSPITAL_COMMUNITY)
Admission: EM | Admit: 2020-03-24 | Discharge: 2020-03-24 | Disposition: A | Discharge status: Home / Self Care | Payer: PPO | Attending: Emergency Medicine | Admitting: Emergency Medicine

## 2020-03-24 ENCOUNTER — Emergency Department (HOSPITAL_COMMUNITY): Payer: PPO

## 2020-03-24 DIAGNOSIS — R079 Chest pain, unspecified: Secondary | ICD-10-CM | POA: Diagnosis not present

## 2020-03-24 DIAGNOSIS — Z5321 Procedure and treatment not carried out due to patient leaving prior to being seen by health care provider: Secondary | ICD-10-CM | POA: Insufficient documentation

## 2020-03-24 LAB — CBC
HCT: 41.2 % (ref 39.0–52.0)
Hemoglobin: 14.1 g/dL (ref 13.0–17.0)
MCH: 31.9 pg (ref 26.0–34.0)
MCHC: 34.2 g/dL (ref 30.0–36.0)
MCV: 93.2 fL (ref 80.0–100.0)
Platelets: 196 10*3/uL (ref 150–400)
RBC: 4.42 MIL/uL (ref 4.22–5.81)
RDW: 13 % (ref 11.5–15.5)
WBC: 12 10*3/uL — ABNORMAL HIGH (ref 4.0–10.5)
nRBC: 0 % (ref 0.0–0.2)

## 2020-03-24 LAB — BASIC METABOLIC PANEL
Anion gap: 17 — ABNORMAL HIGH (ref 5–15)
BUN: 56 mg/dL — ABNORMAL HIGH (ref 8–23)
CO2: 22 mmol/L (ref 22–32)
Calcium: 9.2 mg/dL (ref 8.9–10.3)
Chloride: 95 mmol/L — ABNORMAL LOW (ref 98–111)
Creatinine, Ser: 1.88 mg/dL — ABNORMAL HIGH (ref 0.61–1.24)
GFR calc Af Amer: 41 mL/min — ABNORMAL LOW (ref >60)
GFR calc non Af Amer: 35 mL/min — ABNORMAL LOW (ref >60)
Glucose, Bld: 155 mg/dL — ABNORMAL HIGH (ref 70–99)
Potassium: 4.4 mmol/L (ref 3.5–5.1)
Sodium: 134 mmol/L — ABNORMAL LOW (ref 135–145)

## 2020-03-24 LAB — TROPONIN I (HIGH SENSITIVITY): Troponin I (High Sensitivity): 25 ng/L — ABNORMAL HIGH (ref <18)

## 2020-03-24 LAB — SARS-COV-2 RNA,(COVID-19) QUALITATIVE NAAT: SARS CoV2 RNA: NOT DETECTED

## 2020-03-24 MED ORDER — SODIUM CHLORIDE 0.9% FLUSH: 3.0000 mL | Freq: Once | INTRAVENOUS | Status: DC

## 2020-03-24 NOTE — ED Triage Notes (Signed)
Pt arrives to ED w/ c/o 7/10 non-radiating, left sided chest pain, sob, n/v/d since Monday. Pt also told to come by PCP d/t elevated kidney function tests and elevated WBC count.

## 2020-03-24 NOTE — ED Notes (Signed)
No response for room x3

## 2020-03-29 ENCOUNTER — Ambulatory Visit (INDEPENDENT_AMBULATORY_CARE_PROVIDER_SITE_OTHER): Payer: PPO | Admitting: Family Medicine

## 2020-03-29 ENCOUNTER — Other Ambulatory Visit: Payer: Self-pay

## 2020-03-29 VITALS — BP 136/56 | HR 91 | Temp 96.6°F | Ht 71.0 in | Wt 174.0 lb

## 2020-03-29 DIAGNOSIS — E860 Dehydration: Secondary | ICD-10-CM | POA: Diagnosis not present

## 2020-03-29 DIAGNOSIS — E118 Type 2 diabetes mellitus with unspecified complications: Secondary | ICD-10-CM | POA: Diagnosis not present

## 2020-03-29 DIAGNOSIS — J441 Chronic obstructive pulmonary disease with (acute) exacerbation: Secondary | ICD-10-CM

## 2020-03-29 NOTE — Progress Notes (Signed)
Subjective:    Patient ID: [name], male    DOB: 02/15/1951, 69 y.o.   MRN: [id number]  HPI Patient was extremely sick last week when he saw my partner.  He was vomiting.  He was dehydrated.  Creatinine was over 2.  He was also reporting chest congestion and shortness of breath.  Urinalysis showed no evidence of urinary tract infection.  Covid testing was negative.  Chest x-ray showed no pneumonia.  My partner felt the patient either had viral gastroenteritis and was COPD exacerbation.  She gave him a liter of IV fluid and also started him on Levaquin and prednisone.  He is here today for follow-up.  He is feeling much better since he saw her.  He states that his breathing is 90% back to normal.  His cough has improved although he still has phlegm in his throat.  He denies any chest pain or shortness of breath that is not new.  He denies any abdominal pain.  He denies any nausea vomiting or diarrhea.  He is here today simply for follow-up at our request.  He states that he started to feel back to his normal self. Past Medical History:  Diagnosis Date  . Allergy   . Anemia   . Anginal pain ([place])   . Arthritis    "hands"  . Asthma   . Back pain    4 deteriorating disc and receives an injection q3-16mon;has been doing this for about 50yrs  . Blood transfusion    as a child  . Bronchitis   . Cancer ([place]) dx'd 2013   Non-small cell lung cancer  . Chronic kidney disease    acute kidney failure post surgery  . COPD (chronic obstructive pulmonary disease) (HCC)    uses Albuterol and Spiriva daily  . Coronary artery disease    has 1 stent  . Depression    takes Prozac daily  . Emphysema    sees Dr.Ramaswami for this  . Family history of adverse reaction to anesthesia    mother was hard to wake up sometimes  . GERD (gastroesophageal reflux disease)    takes Prilosec daily, EGD nml 06/2017  . Glaucoma    hx of  . H/O hiatal hernia   . Hypertension    takes  HYzaar daily  .  Insomnia    takes Trazodone nightly  . Lung mass    right upper lobe  . Myocardial infarct ([place])   . 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 Obesity   . 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 Periodic limb movements of sleep   . Peripheral neuropathy   . Pneumonia    hx of' 69 yo,rd' last time in 2013  . Productive cough    white in color but no odor  . Shortness of breath    with exertion   . Sleep apnea    uses BiPaP; "no longer have sleep apnea since I have lost over 100 lbs"  . Syncope and collapse 05/26/2013  . Thyroid disease   . Type II diabetes mellitus (HCC)    takes Metformin bid and Novolog and Lantus daily   Past Surgical History:  Procedure Laterality Date  . CARDIAC CATHETERIZATION  2006/2008/2009  . CARPAL TUNNEL RELEASE     bilateral  . COLONOSCOPY    . CORONARY ANGIOPLASTY WITH STENT PLACEMENT     1 stent  . ELBOW SURGERY  ulnar nerve; left  . ESOPHAGOGASTRODUODENOSCOPY    . EYE SURGERY     pt. denies  . FUDUCIAL PLACEMENT Left 08/24/2016   Procedure: PLACEMENT OF FUDUCIAL;  Surgeon: Melrose Nakayama, MD;  Location: [facility];  Service: Thoracic;  Laterality: Left;  . INGUINAL HERNIA REPAIR  1990's   double inguinal  . LOBECTOMY  03/17/2012   upper right side with resection  . MICROLARYNGOSCOPY WITH CO2 LASER AND EXCISION OF VOCAL CORD LESION N/A 06/28/2017   Procedure: MICROLARYNGOSCOPY WITH BIOPSY;  Surgeon: Melida Quitter, MD;  Location: [facility];  Service: ENT;  Laterality: N/A;  . MICROLARYNGOSCOPY WITH CO2 LASER AND EXCISION OF VOCAL CORD LESION N/A 07/19/2017   Procedure: MICROLARYNGOSCOPY WITH CO2 LASER AND EXCISION OF VOCAL CORD LESION;  Surgeon: Melida Quitter, MD;  Location: [facility];  Service: ENT;  Laterality: N/A;  MICRO DIRECT LARYNGOSCOPY WITH CO2 LASER VOCAL CORD STRIPPING/POSS JET VENTURI VENTILATION  . REFRACTIVE SURGERY     bilaterally  . ROTATOR CUFF REPAIR     left  . VIDEO BRONCHOSCOPY WITH ENDOBRONCHIAL NAVIGATION N/A  08/24/2016   Procedure: VIDEO BRONCHOSCOPY WITH ENDOBRONCHIAL NAVIGATION;  Surgeon: Melrose Nakayama, MD;  Location: [place];  Service: Thoracic;  Laterality: N/A;   Current Outpatient Medications on File Prior to Visit  Medication Sig Dispense Refill  . acetaminophen (TYLENOL) 500 MG tablet Take 1,000 mg by mouth every 6 (six) hours as needed for moderate pain.    Marland Kitchen atorvastatin (LIPITOR) 40 MG tablet Take 40 mg by mouth daily.    Marland Kitchen FLUoxetine (PROZAC) 40 MG capsule TAKE 1 CAPSULE BY MOUTH EVERY DAY 90 capsule 3  . fluticasone (FLONASE) 50 MCG/ACT nasal spray Place 2 sprays into both nostrils as needed.   5  . gabapentin (NEURONTIN) 300 MG capsule TAKE 1 CAPSULE BY MOUTH THREE TIMES A DAY 270 capsule 2  . glucose blood (ONETOUCH ULTRA) test strip TEST ONCE DAILY E11.42    . ipratropium-albuterol (DUONEB) 0.5-2.5 (3) MG/3ML SOLN USE 1 VIAL IN NEBULIZER 4 TIMES A DAY 360 mL 5  . levofloxacin (LEVAQUIN) 500 MG tablet Take 1 tablet (500 mg total) by mouth daily. 7 tablet 0  . losartan (COZAAR) 25 MG tablet Take 25 mg by mouth daily.    . Multiple Vitamin (MULTIVITAMIN WITH MINERALS) TABS Take 1 tablet by mouth daily.    . ondansetron (ZOFRAN) 4 MG tablet Take 1 tablet (4 mg total) by mouth every 8 (eight) hours as needed for nausea or vomiting. 20 tablet 0  . ONE TOUCH ULTRA TEST test strip     . traZODone (DESYREL) 100 MG tablet TAKE 1 TABLET BY MOUTH EVERYDAY AT BEDTIME 90 tablet 1  . diltiazem (CARDIZEM CD) 120 MG 24 hr capsule TAKE 1 CAPSULE BY MOUTH EVERY DAY (Patient not taking: Reported on 03/29/2020) 90 capsule 3  . donepezil (ARICEPT) 5 MG tablet TAKE 1 TABLET BY MOUTH EVERYDAY AT BEDTIME (Patient not taking: Reported on 03/29/2020) 90 tablet 3  . nitroGLYCERIN (NITROSTAT) 0.4 MG SL tablet Place 0.4 mg under the tongue every 5 (five) minutes as needed for chest pain.    . Tiotropium Bromide Monohydrate (SPIRIVA RESPIMAT) 2.5 MCG/ACT AERS Inhale 2 puffs into the lungs daily. (Patient not  taking: Reported on 03/29/2020) 2 Inhaler 0  . [DISCONTINUED] albuterol (VENTOLIN HFA) 108 (90 Base) MCG/ACT inhaler TAKE 2 PUFFS BY MOUTH EVERY 6 HOURS AS NEEDED FOR WHEEZE OR SHORTNESS OF BREATH     Current Facility-Administered Medications on File Prior  to Visit  Medication Dose Route Frequency Provider Last Rate Last Admin  . albuterol (PROVENTIL) (2.5 MG/3ML) 0.083% nebulizer solution 2.5 mg  2.5 mg Nebulization Once Delsa Grana, PA-C       Allergies  Allergen Reactions  . Actos [Pioglitazone] Swelling    EDEMA REACTION UNSPECIFIED  . Avandia [Rosiglitazone] Swelling    SWELLING REACTION UNSPECIFIED   . Dilaudid [Hydromorphone Hcl] Other (See Comments)    Made him crazy   Social History   Socioeconomic History  . Marital status: Married    Spouse name: mary lou  . Number of children: 3  . Years of education: trade   . Highest education level: Not on file  Occupational History  . Occupation: truck Geophysicist/field seismologist    Comment: retired  Tobacco Use  . Smoking status: Current Every Day Smoker    Packs/day: 1.50    Years: 54.00    Pack years: 81.00    Types: Cigarettes  . Smokeless tobacco: Never Used  . Tobacco comment: 1ppd as of 11/26/17  ep  Substance and Sexual Activity  . Alcohol use: No    Comment: no alcolol since 2013  . Drug use: Yes    Types: Cocaine, Marijuana    Comment: quit 1990's  . Sexual activity: Not Currently  Other Topics Concern  . 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   Social Determinants of Health   Financial Resource Strain:   . Difficulty of Paying Living Expenses:   Food Insecurity:   . Worried About Charity fundraiser in the Last Year:   . Arboriculturist in the Last Year:   Transportation Needs:   . Film/video editor  (Medical):   Marland Kitchen Lack of Transportation (Non-Medical):   Physical Activity:   . Days of Exercise per Week:   . Minutes of Exercise per Session:   Stress:   . Feeling of Stress :   Social Connections:   . Frequency of Communication with Friends and Family:   . Frequency of Social Gatherings with Friends and Family:   . Attends Religious Services:   . Active Member of Clubs or Organizations:   . Attends Archivist Meetings:   Marland Kitchen Marital Status:   Intimate Partner Violence:   . Fear of Current or Ex-Partner:   . Emotionally Abused:   Marland Kitchen Physically Abused:   . Sexually Abused:       Review of Systems  All other systems reviewed and are negative.      Objective:   Physical Exam Vitals reviewed.  Constitutional:      General: He is not in acute distress.    Appearance: He is not ill-appearing or toxic-appearing.  Cardiovascular:     Rate and Rhythm: Normal rate and regular rhythm.     Heart sounds: Normal heart sounds.  Pulmonary:     Effort: Pulmonary effort is normal. No respiratory distress.     Breath sounds: No wheezing, rhonchi or rales.  Musculoskeletal:     Right lower leg: No edema.     Left lower leg: No edema.  Neurological:     Mental Status: He is alert.  Assessment & Plan:  Dehydration - Plan: CBC with Differential/Platelet, BASIC METABOLIC PANEL WITH GFR  Controlled type 2 diabetes mellitus with complication, without long-term current use of insulin (HCC) - Plan: Hemoglobin A1c, Lipid panel  COPD exacerbation ([place])  Patient is dramatically improved from his appointment last week.  He states that his breathing is almost back to his baseline.  He is no longer dehydrated.  He is eating and drinking better.  His nausea and vomiting and diarrhea have subsided.  I will repeat a BMP today to monitor his kidney function.  I will also check a CBC to monitor his leukocytosis however the patient just finished his prednisone this morning.  While  the patient is here I will check an A1c and a lipid panel as he is overdue for this.  However clinically the patient appears to be back to his baseline.

## 2020-03-30 LAB — CBC WITH DIFFERENTIAL/PLATELET
Absolute Monocytes: 1035 cells/uL — ABNORMAL HIGH (ref 200–950)
Basophils Absolute: 28 cells/uL (ref 0–200)
Basophils Relative: 0.2 %
Eosinophils Absolute: 83 cells/uL (ref 15–500)
Eosinophils Relative: 0.6 %
HCT: 40.2 % (ref 38.5–50.0)
Hemoglobin: 13.3 g/dL (ref 13.2–17.1)
Lymphs Abs: 1173 cells/uL (ref 850–3900)
MCH: 31.7 pg (ref 27.0–33.0)
MCHC: 33.1 g/dL (ref 32.0–36.0)
MCV: 95.9 fL (ref 80.0–100.0)
MPV: 10.6 fL (ref 7.5–12.5)
Monocytes Relative: 7.5 %
Neutro Abs: 11482 cells/uL — ABNORMAL HIGH (ref 1500–7800)
Neutrophils Relative %: 83.2 %
Platelets: 204 10*3/uL (ref 140–400)
RBC: 4.19 10*6/uL — ABNORMAL LOW (ref 4.20–5.80)
RDW: 12.8 % (ref 11.0–15.0)
Total Lymphocyte: 8.5 %
WBC: 13.8 10*3/uL — ABNORMAL HIGH (ref 3.8–10.8)

## 2020-03-30 LAB — BASIC METABOLIC PANEL WITH GFR
BUN/Creatinine Ratio: 25 (calc) — ABNORMAL HIGH (ref 6–22)
BUN: 37 mg/dL — ABNORMAL HIGH (ref 7–25)
CO2: 25 mmol/L (ref 20–32)
Calcium: 8.6 mg/dL (ref 8.6–10.3)
Chloride: 104 mmol/L (ref 98–110)
Creat: 1.51 mg/dL — ABNORMAL HIGH (ref 0.70–1.18)
GFR, Est African American: 53 mL/min/{1.73_m2} — ABNORMAL LOW (ref >=60)
GFR, Est Non African American: 46 mL/min/{1.73_m2} — ABNORMAL LOW (ref >=60)
Glucose, Bld: 149 mg/dL — ABNORMAL HIGH (ref 65–99)
Potassium: 4.1 mmol/L (ref 3.5–5.3)
Sodium: 138 mmol/L (ref 135–146)

## 2020-03-30 LAB — HEMOGLOBIN A1C
Hgb A1c MFr Bld: 5.9 % of total Hgb — ABNORMAL HIGH (ref <5.7)
Mean Plasma Glucose: 123 (calc)
eAG (mmol/L): 6.8 (calc)

## 2020-03-30 LAB — LIPID PANEL
Cholesterol: 119 mg/dL (ref <200)
HDL: 39 mg/dL — ABNORMAL LOW (ref >=40)
LDL Cholesterol (Calc): 63 mg/dL (calc)
Non-HDL Cholesterol (Calc): 80 mg/dL (calc) (ref <130)
Total CHOL/HDL Ratio: 3.1 (calc) (ref <5.0)
Triglycerides: 87 mg/dL (ref <150)

## 2020-04-06 ENCOUNTER — Encounter: Payer: Self-pay | Admitting: Podiatry

## 2020-04-06 ENCOUNTER — Other Ambulatory Visit: Payer: Self-pay

## 2020-04-06 ENCOUNTER — Ambulatory Visit: Payer: PPO | Admitting: Podiatry

## 2020-04-06 DIAGNOSIS — M79676 Pain in unspecified toe(s): Secondary | ICD-10-CM

## 2020-04-06 DIAGNOSIS — B351 Tinea unguium: Secondary | ICD-10-CM

## 2020-04-06 DIAGNOSIS — E1151 Type 2 diabetes mellitus with diabetic peripheral angiopathy without gangrene: Secondary | ICD-10-CM | POA: Diagnosis not present

## 2020-04-06 DIAGNOSIS — M2010 Hallux valgus (acquired), unspecified foot: Secondary | ICD-10-CM

## 2020-04-06 NOTE — Progress Notes (Signed)
This patient returns to my office for at risk foot care.  This patient requires this care by a professional since this patient will be at risk due to having diabetes.  History of throat cancer.  This patient is unable to cut nails himself since the patient cannot reach his nails.These nails are painful walking and wearing shoes.  This patient presents for at risk foot care today.  General Appearance  Alert, conversant and in no acute stress.  Vascular  Dorsalis pedis and posterior tibial  pulses are weakly  palpable  bilaterally.  Capillary return is within normal limits  bilaterally. Temperature is within normal limits  bilaterally.  Neurologic  Senn-Weinstein monofilament wire test diminished   bilaterally. Muscle power within normal limits bilaterally.  Nails Thick disfigured discolored nails with subungual debris  from hallux to fifth toes bilaterally. No evidence of bacterial infection or drainage bilaterally.  Orthopedic  No limitations of motion  feet .  No crepitus or effusions noted.  No bony pathology or digital deformities noted.HAV  B/L.  Skin  normotropic skin with no porokeratosis noted bilaterally.  No signs of infections or ulcers noted.     Onychomycosis  Pain in right toes  Pain in left toes  Consent was obtained for treatment procedures.   Mechanical debridement of nails 1-5  bilaterally performed with a nail nipper.  Filed with dremel without incident.    Return office visit    3 months                  Told patient to return for periodic foot care and evaluation due to potential at risk complications.   Gardiner Barefoot DPM

## 2020-05-04 ENCOUNTER — Other Ambulatory Visit: Payer: PPO

## 2020-05-04 ENCOUNTER — Ambulatory Visit
Admission: RE | Admit: 2020-05-04 | Discharge: 2020-05-04 | Disposition: A | Discharge status: Home / Self Care | Payer: PPO | Source: Ambulatory Visit | Attending: Radiation Oncology | Admitting: Radiation Oncology

## 2020-05-04 DIAGNOSIS — C3411 Malignant neoplasm of upper lobe, right bronchus or lung: Secondary | ICD-10-CM

## 2020-05-04 DIAGNOSIS — I700 Atherosclerosis of aorta: Secondary | ICD-10-CM | POA: Diagnosis not present

## 2020-05-04 DIAGNOSIS — J439 Emphysema, unspecified: Secondary | ICD-10-CM | POA: Diagnosis not present

## 2020-05-04 DIAGNOSIS — C3490 Malignant neoplasm of unspecified part of unspecified bronchus or lung: Secondary | ICD-10-CM | POA: Diagnosis not present

## 2020-05-04 DIAGNOSIS — I2510 Atherosclerotic heart disease of native coronary artery without angina pectoris: Secondary | ICD-10-CM | POA: Diagnosis not present

## 2020-05-04 MED ORDER — IOPAMIDOL (ISOVUE-300) INJECTION 61%
75.0000 mL | Freq: Once | INTRAVENOUS | Status: AC | PRN
  Administered 2020-05-04: 60 mL via INTRAVENOUS

## 2020-05-05 ENCOUNTER — Other Ambulatory Visit: Payer: Self-pay

## 2020-05-05 ENCOUNTER — Ambulatory Visit
Admission: RE | Admit: 2020-05-05 | Discharge: 2020-05-05 | Disposition: A | Discharge status: Home / Self Care | Payer: PPO | Source: Ambulatory Visit | Attending: Radiation Oncology | Admitting: Radiation Oncology

## 2020-05-05 DIAGNOSIS — N289 Disorder of kidney and ureter, unspecified: Secondary | ICD-10-CM | POA: Diagnosis not present

## 2020-05-05 DIAGNOSIS — F17210 Nicotine dependence, cigarettes, uncomplicated: Secondary | ICD-10-CM | POA: Diagnosis not present

## 2020-05-05 DIAGNOSIS — C3411 Malignant neoplasm of upper lobe, right bronchus or lung: Secondary | ICD-10-CM

## 2020-05-05 DIAGNOSIS — Z8521 Personal history of malignant neoplasm of larynx: Secondary | ICD-10-CM | POA: Diagnosis not present

## 2020-05-05 DIAGNOSIS — Z85118 Personal history of other malignant neoplasm of bronchus and lung: Secondary | ICD-10-CM | POA: Diagnosis not present

## 2020-05-05 DIAGNOSIS — Z08 Encounter for follow-up examination after completed treatment for malignant neoplasm: Secondary | ICD-10-CM | POA: Diagnosis not present

## 2020-05-05 NOTE — Progress Notes (Signed)
Radiation Oncology         [phone] ________________________________  Outpatient Follow Up - Conducted via telephone due to current COVID-19 concerns for limiting patient exposure  I spoke with the patient to conduct this consult visit via telephone to spare the patient unnecessary potential exposure in the healthcare setting during the current COVID-19 pandemic. The patient was notified in advance and was offered a [place] meeting to allow for face to face communication but unfortunately reported that they did not have the appropriate resources/technology to support such a visit and instead preferred to proceed with a telephone visit.   ________________________________  Name: [name]        MRN: [id number]  Date of Service: 05/05/2020 DOB: 06/30/1951  CB:[id number], Cammie Mcgee, MD  Melida Quitter, MD     REFERRING PHYSICIAN: Melida Quitter, MD   DIAGNOSIS: The encounter diagnosis was Non-small cell carcinoma of lung, stage 1 ([place]).   HISTORY OF PRESENT ILLNESS: CEDRIK Weaver is a 69 y.o. male with a history of Stage IA, pT1aN0M0 NSCLC, SCC of the RUL s/p lobectomy in 2013 with Dr. [name]. He developed a new nodule in 2017 in the LLL and a biopsy of this site was also consistent with NSCLC, SCC. He received SBRT to this site and was found in surveillance to have a PET positive lesion in the right vocal cord. 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 He has been followed in surveillance with Dr. Redmond Baseman and is NED as of August 2020's examination. He was treated in April 2020 for a putative Stage IB lesion in the LUL with radiotherapy and has been followed in surveillance since. Of note he has chronic left chest wall pain for several years since completing his SBRT to the LLL, felt to be post radiotherapy induced chondritis, more recently radiation induced instability fracture. It was finally noted to be fractured in 2020 by imaging. He also  has chronic hoarseness from his prior radiotherapy to the larynx.   He has been NED since radiotherapy in 2020, and had a CT chest on 05/04/20 that revealed chronic fracture along the left rib, and also fracture on the right rib injury at the 6th rib laterally. He continues to have stable appearing mediastinal nodes, and persistent post radiotherapy changes in the lungs at the prior SBRT sites. He has not found relief with conventional measures for his rib pain such as neurontin. He has also been offered evaluation with Dr. Maryjean Ka. He is no longer on Oxygen but has not seen Dr. Chase Caller in more than a year, but should have been seen 6-9 months after his last appointment.   On review of systems, the patient reports that he is doing well overall. He continues to have hoarseness but denies any progressive symptoms of cough, shortness of breath, fevers, or chills. He does continue to have chest wall pain and this is still a stabbing quality. He continues to take neurontin without much relief. He is quite tired and fatigued according to his wife as well. He still has been able to avoid supplemental oxygen since our last discussion. No other complaints are noted.  MOST RECENT RADIATION THERAPY:   02/10/2019-02/18/2019 SBRT Treatment: The target in the LUL was treated to 54 Gy in 3 fractions of 18 Gy  08/26/2017 - 10/04/2017: The larynx was treated to 63 Gy in 28 fractions of 2.25 Gy.  09/17/2016 - 09/24/2016 SBRT Treatment: TheLLLtarget was treated  to 54Gy in 30fractions of 18Gy  PAST MEDICAL HISTORY:  Past Medical History:  Diagnosis Date  . Allergy   . Anemia   . Anginal pain ([place])   . Arthritis    "hands"  . Asthma   . Back pain    4 deteriorating disc and receives an injection q3-63mon;has been doing this for about 91yrs  . Blood transfusion    as a child  . Bronchitis   . Cancer ([place]) dx'd 2013   Non-small cell lung cancer  . Chronic kidney disease    acute kidney failure  post surgery  . COPD (chronic obstructive pulmonary disease) (HCC)    uses Albuterol and Spiriva daily  . Coronary artery disease    has 1 stent  . Depression    takes Prozac daily  . Emphysema    sees Dr.Ramaswami for this  . Family history of adverse reaction to anesthesia    mother was hard to wake up sometimes  . GERD (gastroesophageal reflux disease)    takes Prilosec daily, EGD nml 06/2017  . Glaucoma    hx of  . H/O hiatal hernia   . Hypertension    takes  HYzaar daily  . Insomnia    takes Trazodone nightly  . Lung mass    right upper lobe  . Myocardial infarct ([place])   . 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 Obesity   . 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 Periodic limb movements of sleep   . Peripheral neuropathy   . Pneumonia    hx of' 69 yo,rd' last time in 2013  . Productive cough    white in color but no odor  . Shortness of breath    with exertion   . Sleep apnea    uses BiPaP; "no longer have sleep apnea since I have lost over 100 lbs"  . Syncope and collapse 05/26/2013  . Thyroid disease   . Type II diabetes mellitus (HCC)    takes Metformin bid and Novolog and Lantus daily       PAST SURGICAL HISTORY: Past Surgical History:  Procedure Laterality Date  . CARDIAC CATHETERIZATION  2006/2008/2009  . CARPAL TUNNEL RELEASE     bilateral  . COLONOSCOPY    . CORONARY ANGIOPLASTY WITH STENT PLACEMENT     1 stent  . ELBOW SURGERY     ulnar nerve; left  . ESOPHAGOGASTRODUODENOSCOPY    . EYE SURGERY     pt. denies  . FUDUCIAL PLACEMENT Left 08/24/2016   Procedure: PLACEMENT OF FUDUCIAL;  Surgeon: Melrose Nakayama, MD;  Location: [facility];  Service: Thoracic;  Laterality: Left;  . INGUINAL HERNIA REPAIR  1990's   double inguinal  . LOBECTOMY  03/17/2012   upper right side with resection  . MICROLARYNGOSCOPY WITH CO2 LASER AND EXCISION OF VOCAL CORD LESION N/A 06/28/2017   Procedure: MICROLARYNGOSCOPY WITH BIOPSY;  Surgeon: Melida Quitter, MD;  Location: [facility];  Service: ENT;  Laterality: N/A;  . MICROLARYNGOSCOPY WITH CO2 LASER AND EXCISION OF VOCAL CORD LESION N/A 07/19/2017   Procedure: MICROLARYNGOSCOPY WITH CO2 LASER AND EXCISION OF VOCAL CORD LESION;  Surgeon: Melida Quitter, MD;  Location: [facility];  Service: ENT;  Laterality: N/A;  MICRO DIRECT LARYNGOSCOPY WITH CO2 LASER VOCAL CORD STRIPPING/POSS JET VENTURI VENTILATION  . REFRACTIVE SURGERY     bilaterally  . ROTATOR CUFF REPAIR     left  . VIDEO BRONCHOSCOPY WITH ENDOBRONCHIAL  NAVIGATION N/A 08/24/2016   Procedure: VIDEO BRONCHOSCOPY WITH ENDOBRONCHIAL NAVIGATION;  Surgeon: Melrose Nakayama, MD;  Location: [facility];  Service: Thoracic;  Laterality: N/A;     FAMILY HISTORY:  Family History  Problem Relation Age of Onset  . COPD Mother   . Diabetes Mother   . Hypertension Mother   . Heart attack Father   . Diabetes Father   . Anesthesia problems Neg Hx   . Hypotension Neg Hx   . Malignant hyperthermia Neg Hx   . Pseudochol deficiency Neg Hx      SOCIAL HISTORY:  reports that he has been smoking cigarettes. He has a 81.00 pack-year smoking history. He has never used smokeless tobacco. He reports current drug use. Drugs: Cocaine and Marijuana. He reports that he does not drink alcohol. The patient is married and lives in [place].    ALLERGIES: Actos [pioglitazone], Avandia [rosiglitazone], and Dilaudid [hydromorphone hcl]   MEDICATIONS:  Current Outpatient Medications  Medication Sig Dispense Refill  . acetaminophen (TYLENOL) 500 MG tablet Take 1,000 mg by mouth every 6 (six) hours as needed for moderate pain.    Marland Kitchen atorvastatin (LIPITOR) 40 MG tablet Take 40 mg by mouth daily.    Marland Kitchen diltiazem (CARDIZEM CD) 120 MG 24 hr capsule TAKE 1 CAPSULE BY MOUTH EVERY DAY 90 capsule 3  . donepezil (ARICEPT) 5 MG tablet TAKE 1 TABLET BY MOUTH EVERYDAY AT BEDTIME 90 tablet 3  . FLUoxetine (PROZAC) 40 MG capsule TAKE 1 CAPSULE BY MOUTH EVERY DAY 90 capsule 3  .  fluticasone (FLONASE) 50 MCG/ACT nasal spray Place 2 sprays into both nostrils as needed.   5  . gabapentin (NEURONTIN) 300 MG capsule TAKE 1 CAPSULE BY MOUTH THREE TIMES A DAY 270 capsule 2  . glucose blood (ONETOUCH ULTRA) test strip TEST ONCE DAILY E11.42    . ipratropium-albuterol (DUONEB) 0.5-2.5 (3) MG/3ML SOLN USE 1 VIAL IN NEBULIZER 4 TIMES A DAY 360 mL 5  . levofloxacin (LEVAQUIN) 500 MG tablet Take 1 tablet (500 mg total) by mouth daily. 7 tablet 0  . losartan (COZAAR) 25 MG tablet Take 25 mg by mouth daily.    . Multiple Vitamin (MULTIVITAMIN WITH MINERALS) TABS Take 1 tablet by mouth daily.    . nitroGLYCERIN (NITROSTAT) 0.4 MG SL tablet Place 0.4 mg under the tongue every 5 (five) minutes as needed for chest pain.    Marland Kitchen ondansetron (ZOFRAN) 4 MG tablet Take 1 tablet (4 mg total) by mouth every 8 (eight) hours as needed for nausea or vomiting. 20 tablet 0  . ONE TOUCH ULTRA TEST test strip     . Tiotropium Bromide Monohydrate (SPIRIVA RESPIMAT) 2.5 MCG/ACT AERS Inhale 2 puffs into the lungs daily. 2 Inhaler 0  . traZODone (DESYREL) 100 MG tablet TAKE 1 TABLET BY MOUTH EVERYDAY AT BEDTIME 90 tablet 1   Current Facility-Administered Medications  Medication Dose Route Frequency Provider Last Rate Last Admin  . albuterol (PROVENTIL) (2.5 MG/3ML) 0.083% nebulizer solution 2.5 mg  2.5 mg Nebulization Once Delsa Grana, PA-C         REVIEW OF SYSTEMS: On review of systems, the patient reports that he is doing well overall. He has noticed some low chest wall pain on the left recently. He denies any sternal chest pain, shortness of breath, fevers, chills, night sweats, unintended weight changes. He is no longer on O2, and reports he's been off for about 2 years. He has noticed a recent productive cough with congestion but  denies any known Covid exposures, and has not been out of the house with the exception of doctor appointments.  He denies any bowel or bladder disturbances, and denies  abdominal pain, nausea or vomiting. He denies any other new musculoskeletal or joint aches or pains. A complete review of systems is obtained and is otherwise negative.    PHYSICAL EXAM:  Unable to assess due to encounter type.  ECOG = 1  0 - Asymptomatic (Fully active, able to carry on all predisease activities without restriction)  1 - Symptomatic but completely ambulatory (Restricted in physically strenuous activity but ambulatory and able to carry out work of a light or sedentary nature. For example, light housework, office work)  2 - Symptomatic, <50% in bed during the day (Ambulatory and capable of all self care but unable to carry out any work activities. Up and about more than 50% of waking hours)  3 - Symptomatic, >50% in bed, but not bedbound (Capable of only limited self-care, confined to bed or chair 50% or more of waking hours)  4 - Bedbound (Completely disabled. Cannot carry on any self-care. Totally confined to bed or chair)  5 - Death   Eustace Pen MM, Creech RH, Tormey DC, et al. [phone]). "Toxicity and response criteria of the [facility]". [place] Oncol. 5 (6): 649-55    LABORATORY DATA:  Lab Results  Component Value Date   WBC 13.8 (H) 03/29/2020   HGB 13.3 03/29/2020   HCT 40.2 03/29/2020   MCV 95.9 03/29/2020   PLT 204 03/29/2020   Lab Results  Component Value Date   NA 138 03/29/2020   K 4.1 03/29/2020   CL 104 03/29/2020   CO2 25 03/29/2020   Lab Results  Component Value Date   ALT 18 03/23/2020   AST 49 (H) 03/23/2020   ALKPHOS 108 04/01/2018   BILITOT 1.0 03/23/2020      RADIOGRAPHY: CT CHEST W CONTRAST  Result Date: 05/04/2020 CLINICAL DATA:  Non-small cell lung cancer monitoring, history of right upper lobectomy, history of left lung radiation, history of glottic radiation EXAM: CT CHEST WITH CONTRAST TECHNIQUE: Multidetector CT imaging of the chest was performed during intravenous contrast administration. CONTRAST:   67mL ISOVUE-300 IOPAMIDOL (ISOVUE-300) INJECTION 61% COMPARISON:  CT chest, 02/02/2020 FINDINGS: Cardiovascular: Aortic atherosclerosis. Unchanged irregular mural thrombus of the anterior mid descending aorta (series 2, image 86). Normal heart size. Three vessel coronary artery calcifications and/or stents. No pericardial effusion. Mediastinum/Nodes: Unchanged enlarged pretracheal and AP window lymph nodes measuring 2.2 x 1.1 cm. Thyroid gland, trachea, and esophagus demonstrate no significant findings. Lungs/Pleura: Status post right upper lobectomy. Mild paraseptal emphysema. Unchanged subpleural consolidation of the superior segment left lower lobe with adjacent fiducial markers (series 3, image 80). No pleural effusion or pneumothorax. Upper Abdomen: No acute abnormality. Unchanged non nodular thickening of the bilateral adrenal glands in the included upper abdomen. Musculoskeletal: No chest wall mass or suspicious bone lesions identified. Redemonstrated, minimally displaced subacute fracture of the posterior left rib underlying region of radiation fibrosis (series 3, image 74). Chronic, corticated fracture of the lateral right sixth rib unchanged (series 3, image 69). IMPRESSION: 1. Unchanged post radiation appearance of the superior segment left lower lobe. 2. Status post right upper lobectomy. 3. Unchanged enlarged pretracheal and AP window lymph nodes. Attention on follow-up. 4. Emphysema (ICD10-J43.9). 5. Coronary artery disease. Aortic Atherosclerosis (ICD10-I70.0). 6. Unchanged non nodular thickening of the bilateral adrenal glands in the included upper abdomen. Electronically Signed  By: Eddie Candle M.D.   On: 05/04/2020 13:40       IMPRESSION/PLAN: 1.  Putative Stage IB NSCLC in the LUL. The patient continue to be without progressive or recurrent disease radiographically.The patient continues to be NED and we will follow up with his next CT per NCCN guidelines in 6 months. He's aware to call if  he notes any symptoms of concern or questions about his condition.  2.  Stage IA, cT1aN0M0,NSCLC,squamous cell carcinoma of the left lower lobe of lung and history of Stage IA, pT1aN0M0, NSCLC, squamous cell carcinoma of the RUL. These sites remain without active disease and will be followed expectantly during surveillance scans for #1 3.  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 The patient continues to follow with Dr. Redmond Baseman and has been NED since the radiotherapy treatment. 4. Fracture of the left 7th rib and of the right 6th rib.Though the patient has had symptoms of left chest wall pain for several years, it wasn't until his scans in the fall of 2020 that true fracture was seen radiographically. The previously noted seroma is no longer of concern, and he has been offered evaluation with Dr. Maryjean Ka. We will follow up with him in a few weeks to see if he'd like to proceed with referral.  5. Renal insufficency. He knows to avoid NSAIDs for pain. We will continue to do scans with contrast if appropriate based on kidney function. If this is not possible we will plan scans without contrast.  Given current concerns for patient exposure during the COVID-19 pandemic, this encounter was conducted via telephone.  The patient has given verbal consent for this type of encounter. The time spent during this encounter was 45 minutes and 50% of that time was spent in the coordination of his care. The attendants for this meeting include Shona Simpson, [facility] and Marylou and  Nash Dimmer During the encounter, Shona Simpson [facility] was located at [facility] Radiation Oncology Department.  Marylou and JOHNATHIN VANDERSCHAAF  were located at home.    Carola Rhine, PAC

## 2020-05-06 ENCOUNTER — Other Ambulatory Visit: Payer: Self-pay | Admitting: Radiation Oncology

## 2020-05-06 DIAGNOSIS — C3411 Malignant neoplasm of upper lobe, right bronchus or lung: Secondary | ICD-10-CM

## 2020-05-10 ENCOUNTER — Other Ambulatory Visit: Payer: Self-pay | Admitting: Family Medicine

## 2020-05-16 ENCOUNTER — Telehealth: Payer: Self-pay | Admitting: Radiation Oncology

## 2020-05-16 ENCOUNTER — Ambulatory Visit: Payer: Self-pay | Admitting: Radiation Oncology

## 2020-05-16 ENCOUNTER — Telehealth: Payer: Self-pay | Admitting: Family Medicine

## 2020-05-16 DIAGNOSIS — C3411 Malignant neoplasm of upper lobe, right bronchus or lung: Secondary | ICD-10-CM

## 2020-05-16 NOTE — Telephone Encounter (Signed)
I called and spoke with the patient's wife. They have decided to forgo referral for interventional pain assessment for his chest pain but if the patient changes his mind, he will let me know. Otherwise we will plan to see one another in January 2022 for repeat CT.

## 2020-05-16 NOTE — Progress Notes (Signed)
  Chronic Care Management   Note  05/16/2020 Name: [name] MRN: [id number] DOB: July 27, 1951  [name] is a 69 y.o. year old male who is a primary care patient of [name], MD. I reached out to [name] by phone today in response to a referral sent by [name]'s PCP, [name], MD.   [name] was given information about Chronic Care Management services today including:  1. CCM service includes personalized support from designated clinical staff supervised by his physician, including individualized plan of care and coordination with other care providers 2. 24/7 contact phone numbers for assistance for urgent and routine care needs. 3. Service will only be billed when office clinical staff spend 20 minutes or more in a month to coordinate care. 4. Only one practitioner may furnish and bill the service in a calendar month. 5. The patient may stop CCM services at any time (effective at the end of the month) by phone call to the office staff.   Patient agreed to services and verbal consent obtained.   Follow up plan:   [place]

## 2020-06-10 ENCOUNTER — Telehealth: Payer: Self-pay | Admitting: Family Medicine

## 2020-06-10 NOTE — Progress Notes (Signed)
  Chronic Care Management   Note  06/10/2020 Name: BRACY PEPPER MRN: [id number] DOB: 06/19/1951  [name] is a 69 y.o. year old male who is a primary care patient of [name], MD. I reached out to Nash Dimmer by phone today in response to a referral sent by Mr. Eldrick Penick Dieguez's PCP, [name], MD.   [name] was given information about Chronic Care Management services today including:  1. CCM service includes personalized support from designated clinical staff supervised by his physician, including individualized plan of care and coordination with other care providers 2. 24/7 contact phone numbers for assistance for urgent and routine care needs. 3. Service will only be billed when office clinical staff spend 20 minutes or more in a month to coordinate care. 4. Only one practitioner may furnish and bill the service in a calendar month. 5. The patient may stop CCM services at any time (effective at the end of the month) by phone call to the office staff.   Patient agreed to services and verbal consent obtained.   Follow up plan:   Carley Perdue [employer] Scheduler

## 2020-06-22 DIAGNOSIS — Z8521 Personal history of malignant neoplasm of larynx: Secondary | ICD-10-CM | POA: Diagnosis not present

## 2020-06-22 DIAGNOSIS — R490 Dysphonia: Secondary | ICD-10-CM | POA: Diagnosis not present

## 2020-06-22 DIAGNOSIS — F17200 Nicotine dependence, unspecified, uncomplicated: Secondary | ICD-10-CM | POA: Diagnosis not present

## 2020-06-22 DIAGNOSIS — R0989 Other specified symptoms and signs involving the circulatory and respiratory systems: Secondary | ICD-10-CM | POA: Diagnosis not present

## 2020-07-06 ENCOUNTER — Ambulatory Visit: Payer: PPO | Admitting: Podiatry

## 2020-07-06 ENCOUNTER — Encounter: Payer: Self-pay | Admitting: Podiatry

## 2020-07-06 ENCOUNTER — Other Ambulatory Visit: Payer: Self-pay

## 2020-07-06 DIAGNOSIS — M79676 Pain in unspecified toe(s): Secondary | ICD-10-CM | POA: Diagnosis not present

## 2020-07-06 DIAGNOSIS — E1151 Type 2 diabetes mellitus with diabetic peripheral angiopathy without gangrene: Secondary | ICD-10-CM | POA: Diagnosis not present

## 2020-07-06 DIAGNOSIS — B351 Tinea unguium: Secondary | ICD-10-CM

## 2020-07-06 DIAGNOSIS — M2010 Hallux valgus (acquired), unspecified foot: Secondary | ICD-10-CM

## 2020-07-06 NOTE — Progress Notes (Signed)
This patient returns to my office for at risk foot care.  This patient requires this care by a professional since this patient will be at risk due to having diabetes.  History of throat cancer.  This patient is unable to cut nails himself since the patient cannot reach his nails.These nails are painful walking and wearing shoes.  This patient presents for at risk foot care today.  General Appearance  Alert, conversant and in no acute stress.  Vascular  Dorsalis pedis and posterior tibial  pulses are weakly  palpable  bilaterally.  Capillary return is within normal limits  bilaterally. Temperature is within normal limits  bilaterally.  Neurologic  Senn-Weinstein monofilament wire test diminished   bilaterally. Muscle power within normal limits bilaterally.  Nails Thick disfigured discolored nails with subungual debris  from hallux to fifth toes bilaterally. No evidence of bacterial infection or drainage bilaterally.  Orthopedic  No limitations of motion  feet .  No crepitus or effusions noted.  No bony pathology or digital deformities noted.HAV  B/L.  Skin  normotropic skin with no porokeratosis noted bilaterally.  No signs of infections or ulcers noted.     Onychomycosis  Pain in right toes  Pain in left toes  Consent was obtained for treatment procedures.   Mechanical debridement of nails 1-5  bilaterally performed with a nail nipper.  Filed with dremel without incident.    Return office visit    3 months                  Told patient to return for periodic foot care and evaluation due to potential at risk complications.   Gardiner Barefoot DPM

## 2020-07-08 ENCOUNTER — Telehealth: Payer: PPO

## 2020-07-08 DIAGNOSIS — H524 Presbyopia: Secondary | ICD-10-CM | POA: Diagnosis not present

## 2020-07-08 DIAGNOSIS — H2513 Age-related nuclear cataract, bilateral: Secondary | ICD-10-CM | POA: Diagnosis not present

## 2020-07-08 DIAGNOSIS — H25013 Cortical age-related cataract, bilateral: Secondary | ICD-10-CM | POA: Diagnosis not present

## 2020-07-08 DIAGNOSIS — E119 Type 2 diabetes mellitus without complications: Secondary | ICD-10-CM | POA: Diagnosis not present

## 2020-07-08 LAB — HM DIABETES EYE EXAM

## 2020-07-21 NOTE — Chronic Care Management (AMB) (Addendum)
Chronic Care Management Pharmacy  Name: PAYDEN DOCTER  MRN: [id number] DOB: [birth year]  Chief Complaint/ HPI  [name],  69 y.o. , male presents for their Initial CCM visit with the clinical pharmacist via telephone.  PCP : [name], MD  Their chronic conditions include: HTN, CAD, COPD,  Type II DM, Depression. Office Visits: 03/29/2020 ([place]) -  Follow up from previous week Patient feeling much better, medications finished No changes, patient appeared almost back at baseline  03/23/2020 [facility]) -  N/V, fever, refuses to go to ER Has not taken regular medications in a few days WBC was extremely elevated, given IV fluids in office He ended up agreeing to go to hospital  Consult Visit: 07/15/2020 [facility], Podiatry) -  Patient cannot reach his feet to take care of his nails and they are painful while walking or wearing shoes Clipped and filed with no issues, regular follow up  03/24/2020 ([facility]) - note not available Medications: Outpatient Encounter Medications as of 07/26/2020  Medication Sig   acetaminophen (TYLENOL) 500 MG tablet Take 1,000 mg by mouth every 6 (six) hours as needed for moderate pain.   atorvastatin (LIPITOR) 40 MG tablet Take 40 mg by mouth daily.   diltiazem (CARDIZEM CD) 120 MG 24 hr capsule TAKE 1 CAPSULE BY MOUTH EVERY DAY   donepezil (ARICEPT) 5 MG tablet TAKE 1 TABLET BY MOUTH EVERYDAY AT BEDTIME   FLUoxetine (PROZAC) 40 MG capsule TAKE 1 CAPSULE BY MOUTH EVERY DAY   fluticasone (FLONASE) 50 MCG/ACT nasal spray Place 2 sprays into both nostrils as needed.    gabapentin (NEURONTIN) 300 MG capsule TAKE 1 CAPSULE BY MOUTH THREE TIMES A DAY   glucose blood (ONETOUCH ULTRA) test strip TEST ONCE DAILY E11.42   ipratropium-albuterol (DUONEB) 0.5-2.5 (3) MG/3ML SOLN USE 1 VIAL IN NEBULIZER 4 TIMES A DAY   levofloxacin (LEVAQUIN) 500 MG tablet Take 1 tablet (500 mg total) by mouth daily.   losartan (COZAAR) 25 MG tablet Take 25 mg by  mouth daily.   Multiple Vitamin (MULTIVITAMIN WITH MINERALS) TABS Take 1 tablet by mouth daily.   nitroGLYCERIN (NITROSTAT) 0.4 MG SL tablet Place 0.4 mg under the tongue every 5 (five) minutes as needed for chest pain.   ondansetron (ZOFRAN) 4 MG tablet Take 1 tablet (4 mg total) by mouth every 8 (eight) hours as needed for nausea or vomiting.   ONE TOUCH ULTRA TEST test strip    Tiotropium Bromide Monohydrate (SPIRIVA RESPIMAT) 2.5 MCG/ACT AERS Inhale 2 puffs into the lungs daily.   traZODone (DESYREL) 100 MG tablet TAKE 1 TABLET BY MOUTH EVERYDAY AT BEDTIME   [DISCONTINUED] albuterol (VENTOLIN HFA) 108 (90 Base) MCG/ACT inhaler TAKE 2 PUFFS BY MOUTH EVERY 6 HOURS AS NEEDED FOR WHEEZE OR SHORTNESS OF BREATH   Facility-Administered Encounter Medications as of 07/26/2020  Medication   albuterol (PROVENTIL) (2.5 MG/3ML) 0.083% nebulizer solution 2.5 mg     Current Diagnosis/Assessment:   Emergency planning/management officer Strain: Low Risk    Difficulty of Paying Living Expenses: Not very hard    Goals Addressed             This Visit's Progress    Pharmacy Care Plan:       CARE PLAN ENTRY (see longitudinal plan of care for additional care plan information)  Current Barriers:  Chronic Disease Management support, education, and care coordination needs related to Hypertension, Diabetes, COPD, and Depression   Hypertension BP Readings from Last 3 Encounters:  03/29/20 [facility])  136/56  03/24/20 (!) 142/92  03/23/20 140/70   Pharmacist Clinical Goal(s): Over the next 180 days, patient will work with PharmD and providers to maintain BP goal <140/90 Current regimen:  Diltiazem CD 120mg  Losartan 25mg  Interventions: Reviewed home monitoring Discussed medication adherence Patient self care activities - Over the next 180 days, patient will: Check BP periodically, document, and provide at future appointments Ensure daily salt intake < 2300 mg/day  Diabetes Lab Results  Component Value Date/Time    HGBA1C 5.9 (H) 03/29/2020 09:18 AM   HGBA1C 7.2 (H) 08/23/2016 12:44 PM   Pharmacist Clinical Goal(s): Over the next 180 days, patient will work with PharmD and providers to maintain A1c goal <7% Current regimen:  No medications Interventions: Reviewed home blood sugar logs Discussed diabetic diet modifications Patient self care activities - Over the next 180 days, patient will: Check blood sugar once daily, document, and provide at future appointments Continue current lifestyle modifications as current.  COPD Pharmacist Clinical Goal(s) Over the next 180 days, patient will work with PharmD and providers to optimize medication and minimize symptoms of COPD. Current regimen:  Duoneb nebulizers as needed Interventions: Reviewed frequency of other inhaler use Evaluated for qualification of PAP programs  Discussed need for regular maintenance inhaler Patient self care activities - Over the next 180 days, patient will: Gather documents for patient assistance documentation Contact providers with any sudden change in symptoms  Depression Pharmacist Clinical Goal(s) Over the next 180 days, patient will work with PharmD and providers to optimize medication and minimize symptoms of depression. Current regimen:  Fluoxetine 40mg  Interventions: Discussed current symptoms and mood Counseled to continue current dose despite symptoms Patient self care activities - Over the next 180 days, patient will: Follow up with PCP for agitation and additional medication  Initial goal documentation         COPD    Eosinophil count:   Lab Results  Component Value Date/Time   EOSPCT 0.6 03/29/2020 09:18 AM   EOSPCT 1.7 09/18/2017 10:14 AM  %                               Eos (Absolute):  Lab Results  Component Value Date/Time   EOSABS 83 03/29/2020 09:18 AM   EOSABS 0.2 09/18/2017 10:14 AM    Tobacco Status:  Social History   Tobacco Use  Smoking Status Current Every Day Smoker    Packs/day: 1.50   Years: 54.00   Pack years: 81.00   Types: Cigarettes  Smokeless Tobacco Never Used  Tobacco Comment   1ppd as of 11/26/17  ep    Patient has failed these meds in past: none noted Patient is currently uncontrolled on the following medications:  Duoneb 0.5-2.86mc/3ml Using maintenance inhaler regularly? No Frequency of rescue inhaler use:  never, uses albuterol nebs daily  He is currently not using any of his inhalers due to cost, only nebulizers.  He reports increased mucus and phlegm especially at night time.  Reports increased cough as of late.  Patient needs to be on maintenance inhaler based on current symptoms.  Only on SSI, so I will will begin completing PAP for Spiriva while they are gathering documents for proof of income.  Plan  Continue current medications, apply for PAP for Spiriva through [facility] cares.  Hypertension   BP goal is:  <140/90  Office blood pressures are  BP Readings from Last 3 Encounters:  03/29/20 (!) 136/56  03/24/20 (!) 142/92  03/23/20 140/70   Patient checks BP at home infrequently Patient home BP readings are ranging: no logs available  Patient has failed these meds in the past: none noted Patient is currently controlled on the following medications:  Diltiazem CD 120mg  daily Losartan 25mg   He does not currently check BP at home, but does have access to a cuff.  Recommended he check periodically and call us if > 140/90.  Office BP has been borderline.  He denies dizziness, headaches.  Adherent to medication daily using a pill box.  Plan  Continue current medications, initiate monitoring plan.     Diabetes   A1c goal <6.5%  Recent Relevant Labs: Lab Results  Component Value Date/Time   HGBA1C 5.9 (H) 03/29/2020 09:18 AM   HGBA1C 7.2 (H) 08/23/2016 12:44 PM   GFR 41.76 (L) 04/30/2013 04:00 PM   GFR 47.34 (L) 01/14/2013 05:07 PM    Last diabetic Eye exam:  Lab Results  Component Value Date/Time   HMDIABEYEEXA No  Retinopathy 07/07/2019 12:00 AM    Last diabetic Foot exam:  Lab Results  Component Value Date/Time   HMDIABFOOTEX Dr Buddy Duty 12/08/2012 12:00 AM     Checking BG: Daily  Recent FBG Readings: 113-130   Patient has failed these meds in past: pioglitazone (swelling)  Patient is currently controlled on the following medications: None currently  Blood sugar is well controlled on no medications.  Encouraged him to continue current dietary restrictions as it seems to be keeping sugar at goal.  Plan  Continue current dietary choices and blood sugar monitoring. CAD   Patient has failed these meds in past: none noted Patient is currently controlled on the following medications:  None noted  Controlling risk factors.   Plan  Continue current medications   Depression   Patient has failed these meds in past: none noted Patient is currently uncontrolled on the following medications:  Fluoxetine 40mg  Trazodone 100mg  hs  He reports that he needs additional medication for depression/agitation.  He reports he gets mean almost daily.  Mentioned he was about to double up on his Prozaac dose and I instructed him not to do that as he would be over the maximum dose.  Encouraged him to get appointment with Dr. Dennard Schaumann if he feels he needs something additional.   Plan  Continue current medications, follow up with PCP if something additional is needed for agitation.  Vaccines   Reviewed and discussed patient's vaccination history.    Immunization History  Administered Date(s) Administered   Fluad Quad(high Dose 65+) 08/11/2019   Influenza Split 08/21/2012, 08/25/2013   Influenza Whole 06/06/2009, 07/07/2011   Influenza, High Dose Seasonal PF 08/13/2017, 09/01/2018   Influenza,inj,Quad PF,6+ Mos 10/25/2014, 08/17/2016   Influenza-Unspecified 08/09/2010, 08/13/2017   PFIZER SARS-COV-2 Vaccination 12/28/2019, 01/18/2020   Pneumococcal Conjugate-13 10/02/2013   Pneumococcal  Polysaccharide-23 11/11/2008, 08/11/2019   Tdap 09/09/2012   Zoster 09/09/2012    Plan  Recommended patient receive Shingrix vaccine in office.  Medication Management   Miscellaneous medications:  Fluticasone 40mcg Nitroglycerin 0.4mg  SL OTC's:  Tylenol 500mg  prn MVI Patient currently uses [facility] pharmacy.  Phone #  [phone] Patient reports using pill box method to organize medications and promote adherence. Patient denies missed doses of medication.   Beverly Milch, PharmD Clinical Pharmacist [place] [phone]    I have collaborated with the care management provider regarding care management and care coordination activities outlined in this encounter and have reviewed this  encounter including documentation in the note and care plan. I am certifying that I agree with the content of this note and encounter as supervising physician.

## 2020-07-25 ENCOUNTER — Telehealth: Payer: Self-pay | Admitting: Pharmacist

## 2020-07-25 NOTE — Progress Notes (Signed)
Chronic Care Management Pharmacy Assistant   Name: [name]  MRN: [id number] DOB: 08/19/1951  Reason for Encounter: Medication Review  Patient Questions:  1.  Have you seen any other providers since your last visit? Yes, 06/22/20- Hessie Knows (Otolaryngology), 07/06/20- Gardiner Barefoot, DPM (Podiatry)  2.  Any changes in your medicines or health? Yes, patient stopped taking Aricept.   [name],  69 y.o. , male presents for their Initial CCM visit with the clinical pharmacist via telephone.  PCP : [name], MD  Allergies:   Allergies  Allergen Reactions  . Actos [Pioglitazone] Swelling    EDEMA REACTION UNSPECIFIED  . Avandia [Rosiglitazone] Swelling    SWELLING REACTION UNSPECIFIED   . Dilaudid [Hydromorphone Hcl] Other (See Comments)    Made him crazy    Medications: Outpatient Encounter Medications as of 07/25/2020  Medication Sig  . acetaminophen (TYLENOL) 500 MG tablet Take 1,000 mg by mouth every 6 (six) hours as needed for moderate pain.  Marland Kitchen atorvastatin (LIPITOR) 40 MG tablet Take 40 mg by mouth daily.  Marland Kitchen diltiazem (CARDIZEM CD) 120 MG 24 hr capsule TAKE 1 CAPSULE BY MOUTH EVERY DAY  . donepezil (ARICEPT) 5 MG tablet TAKE 1 TABLET BY MOUTH EVERYDAY AT BEDTIME  . FLUoxetine (PROZAC) 40 MG capsule TAKE 1 CAPSULE BY MOUTH EVERY DAY  . fluticasone (FLONASE) 50 MCG/ACT nasal spray Place 2 sprays into both nostrils as needed.   . gabapentin (NEURONTIN) 300 MG capsule TAKE 1 CAPSULE BY MOUTH THREE TIMES A DAY  . glucose blood (ONETOUCH ULTRA) test strip TEST ONCE DAILY E11.42  . ipratropium-albuterol (DUONEB) 0.5-2.5 (3) MG/3ML SOLN USE 1 VIAL IN NEBULIZER 4 TIMES A DAY  . levofloxacin (LEVAQUIN) 500 MG tablet Take 1 tablet (500 mg total) by mouth daily.  Marland Kitchen losartan (COZAAR) 25 MG tablet Take 25 mg by mouth daily.  . Multiple Vitamin (MULTIVITAMIN WITH MINERALS) TABS Take 1 tablet by mouth daily.  . nitroGLYCERIN (NITROSTAT) 0.4 MG SL tablet  Place 0.4 mg under the tongue every 5 (five) minutes as needed for chest pain.  Marland Kitchen ondansetron (ZOFRAN) 4 MG tablet Take 1 tablet (4 mg total) by mouth every 8 (eight) hours as needed for nausea or vomiting.  . ONE TOUCH ULTRA TEST test strip   . Tiotropium Bromide Monohydrate (SPIRIVA RESPIMAT) 2.5 MCG/ACT AERS Inhale 2 puffs into the lungs daily.  . traZODone (DESYREL) 100 MG tablet TAKE 1 TABLET BY MOUTH EVERYDAY AT BEDTIME  . [DISCONTINUED] albuterol (VENTOLIN HFA) 108 (90 Base) MCG/ACT inhaler TAKE 2 PUFFS BY MOUTH EVERY 6 HOURS AS NEEDED FOR WHEEZE OR SHORTNESS OF BREATH   Facility-Administered Encounter Medications as of 07/25/2020  Medication  . albuterol (PROVENTIL) (2.5 MG/3ML) 0.083% nebulizer solution 2.5 mg    Current Diagnosis: Patient Active Problem List   Diagnosis Date Noted  . Rib fracture 08/05/2019  . Primary cancer of left upper lobe of lung ([place]) 01/13/2019  . 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 Chronic sinusitis 07/04/2017  . 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 CAD (coronary artery disease), native coronary artery 09/18/2015  . Orthostatic hypotension 07/20/2015  . Smoking 02/26/2015  . Neurocardiogenic syncope 05/26/2013  . Seizures ([place]) 05/03/2013  . Chronic kidney disease   . Type II diabetes mellitus ([place])   . OSA on CPAP 09/26/2012  . Neuropathic pain  of both legs 04/23/2012  . S/P lobectomy of lung 04/04/2012  . Non-small cell carcinoma of lung, stage 1 ([place]) 03/17/2012    Class: Stage 1  . Chest pain, atypical 12/21/2011  . COPD exacerbation ([place]) 09/20/2011  . Hypertension   . Depression   . GERD (gastroesophageal reflux disease)   . CIGARETTE SMOKER 09/25/2007  . DIZZINESS AND GIDDINESS 09/25/2007  . Chronic obstructive pulmonary disease ([place]) 08/22/2007    Goals Addressed   None     Follow-Up:  [facility]     Have you seen any other providers since your last visit? Yes Dr. Redmond Baseman (Oncology) Rudolpho Sevin, Daneen Schick (Cardiology), Delrae Rend, MD (Endocrinology), Dr. Sharyon Cable (Podiatry), Dr. Satira Sark (Opthalmology) Any changes in your medications or health? No, not since seeing Dr. Dennard Schaumann. Any side effects from any medications? No Do you have an symptoms or problems not managed by your medications? No Any concerns about your health right now? No Has your provider asked that you check blood pressure, blood sugar, or follow special diet at home? Yes, he checks blood sugar daily and only takes the lowest dosage of Lantus if his sugar is above 200. His sugar is well-controlled and he has not had to take Lantus. Do you get any type of exercise on a regular basis? No, patient does not get a lot of exercise but he does now the yard per wife/caregiver. Can you think of a goal you would like to reach for your health? Per wife, patient would just like to be able to get out of bed each day and make it through without giving up. Patient has some good days and some bad days. Do you have any problems getting your medications? No. Is there anything that you would like to discuss during the appointment? Dr. Dennard Schaumann put patient on a generic medication for memory (Aricept) but it gave him diarrhea causing him to stop taking the medication. Wife states she didn't see any improvement while he was taking it. Patient took the medication for 3-4 months before stopping. Patient may be need an alternative.  Please bring medications and supplements to appointment

## 2020-07-26 ENCOUNTER — Ambulatory Visit: Payer: PPO | Admitting: Pharmacist

## 2020-07-26 DIAGNOSIS — F32A Depression, unspecified: Secondary | ICD-10-CM

## 2020-07-26 DIAGNOSIS — I10 Essential (primary) hypertension: Secondary | ICD-10-CM

## 2020-07-26 DIAGNOSIS — Z794 Long term (current) use of insulin: Secondary | ICD-10-CM

## 2020-07-26 DIAGNOSIS — J418 Mixed simple and mucopurulent chronic bronchitis: Secondary | ICD-10-CM

## 2020-07-26 NOTE — Patient Instructions (Addendum)
Visit Information Thank you for meeting with me today!  I look forward to working with you to help you meet all of your healthcare goals and answer any questions you may have.  Feel free to contact me anytime!  Goals Addressed            This Visit's Progress   . Pharmacy Care Plan:       CARE PLAN ENTRY (see longitudinal plan of care for additional care plan information)  Current Barriers:  . Chronic Disease Management support, education, and care coordination needs related to Hypertension, Diabetes, COPD, and Depression   Hypertension BP Readings from Last 3 Encounters:  03/29/20 (!) 136/56  03/24/20 (!) 142/92  03/23/20 140/70   . Pharmacist Clinical Goal(s): o Over the next 180 days, patient will work with PharmD and providers to maintain BP goal <140/90 . Current regimen:  o Diltiazem CD 120mg  o Losartan 25mg  . Interventions: o Reviewed home monitoring o Discussed medication adherence . Patient self care activities - Over the next 180 days, patient will: o Check BP periodically, document, and provide at future appointments o Ensure daily salt intake < 2300 mg/day  Diabetes Lab Results  Component Value Date/Time   HGBA1C 5.9 (H) 03/29/2020 09:18 AM   HGBA1C 7.2 (H) 08/23/2016 12:44 PM   . Pharmacist Clinical Goal(s): o Over the next 180 days, patient will work with PharmD and providers to maintain A1c goal <7% . Current regimen:  o No medications . Interventions: o Reviewed home blood sugar logs o Discussed diabetic diet modifications . Patient self care activities - Over the next 180 days, patient will: o Check blood sugar once daily, document, and provide at future appointments o Continue current lifestyle modifications as current.  COPD . Pharmacist Clinical Goal(s) o Over the next 180 days, patient will work with PharmD and providers to optimize medication and minimize symptoms of COPD. . Current regimen:  o Duoneb nebulizers as  needed . Interventions: o Reviewed frequency of other inhaler use o Evaluated for qualification of PAP programs  o Discussed need for regular maintenance inhaler . Patient self care activities - Over the next 180 days, patient will: o Gather documents for patient assistance documentation o Contact providers with any sudden change in symptoms  Depression . Pharmacist Clinical Goal(s) o Over the next 180 days, patient will work with PharmD and providers to optimize medication and minimize symptoms of depression. . Current regimen:  o Fluoxetine 40mg  . Interventions: o Discussed current symptoms and mood o Counseled to continue current dose despite symptoms . Patient self care activities - Over the next 180 days, patient will: o Follow up with PCP for agitation and additional medication  Initial goal documentation        [name] was given information about Chronic Care Management services today including:  1. CCM service includes personalized support from designated clinical staff supervised by his physician, including individualized plan of care and coordination with other care providers 2. 24/7 contact phone numbers for assistance for urgent and routine care needs. 3. Standard insurance, coinsurance, copays and deductibles apply for chronic care management only during months in which we provide at least 20 minutes of these services. Most insurances cover these services at 100%, however patients may be responsible for any copay, coinsurance and/or deductible if applicable. This service may help you avoid the need for more expensive face-to-face services. 4. Only one practitioner may furnish and bill the service in a calendar month. 5. The patient  may stop CCM services at any time (effective at the end of the month) by phone call to the office staff.  Patient agreed to services and verbal consent obtained.   The patient verbalized understanding of instructions provided today and  agreed to receive a mailed copy of patient instruction and/or educational materials. Telephone follow up appointment with pharmacy team member scheduled for: 6 months  Beverly Milch, PharmD Clinical Pharmacist [facility] [phone]   Managing Your Hypertension Hypertension is commonly called high blood pressure. This is when the force of your blood pressing against the walls of your arteries is too strong. Arteries are blood vessels that carry blood from your heart throughout your body. Hypertension forces the heart to work harder to pump blood, and may cause the arteries to become narrow or stiff. Having untreated or uncontrolled hypertension can cause heart attack, stroke, kidney disease, and other problems. What are blood pressure readings? A blood pressure reading consists of a higher number over a lower number. Ideally, your blood pressure should be below 120/80. The first ("top") number is called the systolic pressure. It is a measure of the pressure in your arteries as your heart beats. The second ("bottom") number is called the diastolic pressure. It is a measure of the pressure in your arteries as the heart relaxes. What does my blood pressure reading mean? Blood pressure is classified into four stages. Based on your blood pressure reading, your health care provider may use the following stages to determine what type of treatment you need, if any. Systolic pressure and diastolic pressure are measured in a unit called mm Hg. Normal  Systolic pressure: below 833.  Diastolic pressure: below 80. Elevated  Systolic pressure: 825-053.  Diastolic pressure: below 80. Hypertension stage 1  Systolic pressure: 976-734.  Diastolic pressure: 19-37. Hypertension stage 2  Systolic pressure: 902 or above.  Diastolic pressure: 90 or above. What health risks are associated with hypertension? Managing your hypertension is an important responsibility. Uncontrolled  hypertension can lead to:  A heart attack.  A stroke.  A weakened blood vessel (aneurysm).  Heart failure.  Kidney damage.  Eye damage.  Metabolic syndrome.  Memory and concentration problems. What changes can I make to manage my hypertension? Hypertension can be managed by making lifestyle changes and possibly by taking medicines. Your health care provider will help you make a plan to bring your blood pressure within a normal range. Eating and drinking   Eat a diet that is high in fiber and potassium, and low in salt (sodium), added sugar, and fat. An example eating plan is called the DASH (Dietary Approaches to Stop Hypertension) diet. To eat this way: ? Eat plenty of fresh fruits and vegetables. Try to fill half of your plate at each meal with fruits and vegetables. ? Eat whole grains, such as whole wheat pasta, brown rice, or whole grain bread. Fill about one quarter of your plate with whole grains. ? Eat low-fat diary products. ? Avoid fatty cuts of meat, processed or cured meats, and poultry with skin. Fill about one quarter of your plate with lean proteins such as fish, chicken without skin, beans, eggs, and tofu. ? Avoid premade and processed foods. These tend to be higher in sodium, added sugar, and fat.  Reduce your daily sodium intake. Most people with hypertension should eat less than 1,500 mg of sodium a day.  Limit alcohol intake to no more than 1 drink a day for nonpregnant women and 2  drinks a day for men. One drink equals 12 oz of beer, 5 oz of wine, or 1 oz of hard liquor. Lifestyle  Work with your health care provider to maintain a healthy body weight, or to lose weight. Ask what an ideal weight is for you.  Get at least 30 minutes of exercise that causes your heart to beat faster (aerobic exercise) most days of the week. Activities may include walking, swimming, or biking.  Include exercise to strengthen your muscles (resistance exercise), such as weight  lifting, as part of your weekly exercise routine. Try to do these types of exercises for 30 minutes at least 3 days a week.  Do not use any products that contain nicotine or tobacco, such as cigarettes and e-cigarettes. If you need help quitting, ask your health care provider.  Control any long-term (chronic) conditions you have, such as high cholesterol or diabetes. Monitoring  Monitor your blood pressure at home as told by your health care provider. Your personal target blood pressure may vary depending on your medical conditions, your age, and other factors.  Have your blood pressure checked regularly, as often as told by your health care provider. Working with your health care provider  Review all the medicines you take with your health care provider because there may be side effects or interactions.  Talk with your health care provider about your diet, exercise habits, and other lifestyle factors that may be contributing to hypertension.  Visit your health care provider regularly. Your health care provider can help you create and adjust your plan for managing hypertension. Will I need medicine to control my blood pressure? Your health care provider may prescribe medicine if lifestyle changes are not enough to get your blood pressure under control, and if:  Your systolic blood pressure is 130 or higher.  Your diastolic blood pressure is 80 or higher. Take medicines only as told by your health care provider. Follow the directions carefully. Blood pressure medicines must be taken as prescribed. The medicine does not work as well when you skip doses. Skipping doses also puts you at risk for problems. Contact a health care provider if:  You think you are having a reaction to medicines you have taken.  You have repeated (recurrent) headaches.  You feel dizzy.  You have swelling in your ankles.  You have trouble with your vision. Get help right away if:  You develop a severe  headache or confusion.  You have unusual weakness or numbness, or you feel faint.  You have severe pain in your chest or abdomen.  You vomit repeatedly.  You have trouble breathing. Summary  Hypertension is when the force of blood pumping through your arteries is too strong. If this condition is not controlled, it may put you at risk for serious complications.  Your personal target blood pressure may vary depending on your medical conditions, your age, and other factors. For most people, a normal blood pressure is less than 120/80.  Hypertension is managed by lifestyle changes, medicines, or both. Lifestyle changes include weight loss, eating a healthy, low-sodium diet, exercising more, and limiting alcohol. This information is not intended to replace advice given to you by your health care provider. Make sure you discuss any questions you have with your health care provider. Document Revised: 02/13/2019 Document Reviewed: 09/19/2016 Elsevier Patient Education  [place].

## 2020-07-28 ENCOUNTER — Other Ambulatory Visit: Payer: Self-pay | Admitting: *Deleted

## 2020-07-28 DIAGNOSIS — J418 Mixed simple and mucopurulent chronic bronchitis: Secondary | ICD-10-CM

## 2020-07-28 DIAGNOSIS — F32A Depression, unspecified: Secondary | ICD-10-CM

## 2020-07-28 DIAGNOSIS — C3411 Malignant neoplasm of upper lobe, right bronchus or lung: Secondary | ICD-10-CM

## 2020-07-28 DIAGNOSIS — E118 Type 2 diabetes mellitus with unspecified complications: Secondary | ICD-10-CM

## 2020-07-28 DIAGNOSIS — R911 Solitary pulmonary nodule: Secondary | ICD-10-CM

## 2020-07-28 DIAGNOSIS — I10 Essential (primary) hypertension: Secondary | ICD-10-CM

## 2020-07-28 DIAGNOSIS — R413 Other amnesia: Secondary | ICD-10-CM

## 2020-07-28 DIAGNOSIS — E1159 Type 2 diabetes mellitus with other circulatory complications: Secondary | ICD-10-CM

## 2020-07-28 DIAGNOSIS — J441 Chronic obstructive pulmonary disease with (acute) exacerbation: Secondary | ICD-10-CM

## 2020-07-28 DIAGNOSIS — C320 Malignant neoplasm of glottis: Secondary | ICD-10-CM

## 2020-07-29 ENCOUNTER — Other Ambulatory Visit: Payer: Self-pay | Admitting: Family Medicine

## 2020-07-29 MED ORDER — TRAZODONE HCL 100 MG PO TABS: ORAL_TABLET | ORAL | 1 refills | Status: DC

## 2020-07-29 NOTE — Telephone Encounter (Signed)
Prescription sent to pharmacy.

## 2020-07-29 NOTE — Telephone Encounter (Signed)
[facility] on [address] Patient would like his trazodone called in.  CB# [phone]

## 2020-08-04 ENCOUNTER — Ambulatory Visit: Payer: Self-pay | Admitting: Pharmacist

## 2020-08-04 NOTE — Chronic Care Management (AMB) (Addendum)
  Chronic Care Management   Follow Up Note   08/04/2020 Name: [name] MRN: [id number] DOB: [birth year]  Referred by: [name], MD Reason for referral : No chief complaint on file.   [name] is a 69 y.o. year old male who is a primary care patient of Pickard, Cammie Mcgee, MD. The CCM team was consulted for assistance with chronic disease management and care coordination needs.    Review of patient status, including review of consultants reports, relevant laboratory and other test results, and collaboration with appropriate care team members and the patient's provider was performed as part of comprehensive patient evaluation and provision of chronic care management services.    SDOH (Social Determinants of Health) assessments performed: No See Care Plan activities for detailed interventions related to [facility])      Outpatient Encounter Medications as of 08/04/2020  Medication Sig   acetaminophen (TYLENOL) 500 MG tablet Take 1,000 mg by mouth every 6 (six) hours as needed for moderate pain.   atorvastatin (LIPITOR) 40 MG tablet Take 40 mg by mouth daily.   diltiazem (CARDIZEM CD) 120 MG 24 hr capsule TAKE 1 CAPSULE BY MOUTH EVERY DAY   donepezil (ARICEPT) 5 MG tablet TAKE 1 TABLET BY MOUTH EVERYDAY AT BEDTIME   FLUoxetine (PROZAC) 40 MG capsule TAKE 1 CAPSULE BY MOUTH EVERY DAY   fluticasone (FLONASE) 50 MCG/ACT nasal spray Place 2 sprays into both nostrils as needed.    gabapentin (NEURONTIN) 300 MG capsule TAKE 1 CAPSULE BY MOUTH THREE TIMES A DAY   glucose blood (ONETOUCH ULTRA) test strip TEST ONCE DAILY E11.42   ipratropium-albuterol (DUONEB) 0.5-2.5 (3) MG/3ML SOLN USE 1 VIAL IN NEBULIZER 4 TIMES A DAY   levofloxacin (LEVAQUIN) 500 MG tablet Take 1 tablet (500 mg total) by mouth daily.   losartan (COZAAR) 25 MG tablet Take 25 mg by mouth daily.   Multiple Vitamin (MULTIVITAMIN WITH MINERALS) TABS Take 1 tablet by mouth daily.   nitroGLYCERIN (NITROSTAT) 0.4 MG  SL tablet Place 0.4 mg under the tongue every 5 (five) minutes as needed for chest pain.   ondansetron (ZOFRAN) 4 MG tablet Take 1 tablet (4 mg total) by mouth every 8 (eight) hours as needed for nausea or vomiting.   ONE TOUCH ULTRA TEST test strip    Tiotropium Bromide Monohydrate (SPIRIVA RESPIMAT) 2.5 MCG/ACT AERS Inhale 2 puffs into the lungs daily.   traZODone (DESYREL) 100 MG tablet TAKE 1 TABLET BY MOUTH EVERYDAY AT BEDTIME   [DISCONTINUED] albuterol (VENTOLIN HFA) 108 (90 Base) MCG/ACT inhaler TAKE 2 PUFFS BY MOUTH EVERY 6 HOURS AS NEEDED FOR WHEEZE OR SHORTNESS OF BREATH   Facility-Administered Encounter Medications as of 08/04/2020  Medication   albuterol (PROVENTIL) (2.5 MG/3ML) 0.083% nebulizer solution 2.5 mg     Objective:  Patient assistance application for Donato Schultz for Spiriva Respimat 2.53mcg/act. Goals Addressed   None   Application completed through [facility] cares, pending signature from Dr. Chase Caller for completion.  Plan:   CCM team will follow up on this within 7-14 days.   Beverly Milch, PharmD Clinical Pharmacist [place] [phone]  I have collaborated with the care management provider regarding care management and care coordination activities outlined in this encounter and have reviewed this encounter including documentation in the note and care plan. I am certifying that I agree with the content of this note and encounter as supervising physician.

## 2020-08-22 DIAGNOSIS — N1831 Chronic kidney disease, stage 3a: Secondary | ICD-10-CM | POA: Diagnosis not present

## 2020-08-22 DIAGNOSIS — E1142 Type 2 diabetes mellitus with diabetic polyneuropathy: Secondary | ICD-10-CM | POA: Diagnosis not present

## 2020-08-22 DIAGNOSIS — Z23 Encounter for immunization: Secondary | ICD-10-CM | POA: Diagnosis not present

## 2020-08-23 ENCOUNTER — Ambulatory Visit: Payer: Self-pay | Admitting: Pharmacist

## 2020-08-23 NOTE — Chronic Care Management (AMB) (Addendum)
  Chronic Care Management   Follow Up Note   08/23/2020 Name: [name] MRN: [id number] DOB: 02/05/1951  Referred by: [name], MD Reason for referral : No chief complaint on file.   SHAHRAM ALEXOPOULOS is a 69 y.o. year old male who is a primary care patient of Pickard, Cammie Mcgee, MD. The CCM team was consulted for assistance with chronic disease management and care coordination needs.    Review of patient status, including review of consultants reports, relevant laboratory and other test results, and collaboration with appropriate care team members and the patient's provider was performed as part of comprehensive patient evaluation and provision of chronic care management services.    SDOH (Social Determinants of Health) assessments performed: No See Care Plan activities for detailed interventions related to [facility])      Outpatient Encounter Medications as of 08/23/2020  Medication Sig   acetaminophen (TYLENOL) 500 MG tablet Take 1,000 mg by mouth every 6 (six) hours as needed for moderate pain.   atorvastatin (LIPITOR) 40 MG tablet Take 40 mg by mouth daily.   diltiazem (CARDIZEM CD) 120 MG 24 hr capsule TAKE 1 CAPSULE BY MOUTH EVERY DAY   donepezil (ARICEPT) 5 MG tablet TAKE 1 TABLET BY MOUTH EVERYDAY AT BEDTIME   FLUoxetine (PROZAC) 40 MG capsule TAKE 1 CAPSULE BY MOUTH EVERY DAY   fluticasone (FLONASE) 50 MCG/ACT nasal spray Place 2 sprays into both nostrils as needed.    gabapentin (NEURONTIN) 300 MG capsule TAKE 1 CAPSULE BY MOUTH THREE TIMES A DAY   glucose blood (ONETOUCH ULTRA) test strip TEST ONCE DAILY E11.42   ipratropium-albuterol (DUONEB) 0.5-2.5 (3) MG/3ML SOLN USE 1 VIAL IN NEBULIZER 4 TIMES A DAY   levofloxacin (LEVAQUIN) 500 MG tablet Take 1 tablet (500 mg total) by mouth daily.   losartan (COZAAR) 25 MG tablet Take 25 mg by mouth daily.   Multiple Vitamin (MULTIVITAMIN WITH MINERALS) TABS Take 1 tablet by mouth daily.   nitroGLYCERIN (NITROSTAT) 0.4 MG  SL tablet Place 0.4 mg under the tongue every 5 (five) minutes as needed for chest pain.   ondansetron (ZOFRAN) 4 MG tablet Take 1 tablet (4 mg total) by mouth every 8 (eight) hours as needed for nausea or vomiting.   ONE TOUCH ULTRA TEST test strip    Tiotropium Bromide Monohydrate (SPIRIVA RESPIMAT) 2.5 MCG/ACT AERS Inhale 2 puffs into the lungs daily.   traZODone (DESYREL) 100 MG tablet TAKE 1 TABLET BY MOUTH EVERYDAY AT BEDTIME   [DISCONTINUED] albuterol (VENTOLIN HFA) 108 (90 Base) MCG/ACT inhaler TAKE 2 PUFFS BY MOUTH EVERY 6 HOURS AS NEEDED FOR WHEEZE OR SHORTNESS OF BREATH   Facility-Administered Encounter Medications as of 08/23/2020  Medication   albuterol (PROVENTIL) (2.5 MG/3ML) 0.083% nebulizer solution 2.5 mg     Objective:  Follow Up on Spiriva PAP program through [facility] cares. Goals Addressed   None    Representative informed me that his application did not meet income requirements due to household size and combined income.   Plan:   Will evaluate other alternative therapies for patient.   Beverly Milch, PharmD Clinical Pharmacist [place] [phone]  I have collaborated with the care management provider regarding care management and care coordination activities outlined in this encounter and have reviewed this encounter including documentation in the note and care plan. I am certifying that I agree with the content of this note and encounter as supervising physician.

## 2020-09-02 ENCOUNTER — Telehealth: Payer: Self-pay | Admitting: Pharmacist

## 2020-09-02 NOTE — Progress Notes (Signed)
Verified Adherence Gap Information. Per insurance data, the patient is 100% compliant with their Losartan Pot 25 mg medication. Last fill date 07-22-2020 for a 90 day supply. 90-99% compliant with their Atorvastatin 40 mg medication. Last Fill date 06-26-2020 for a 90 day supply.  Fanny Skates, [place] Pharmacist Assistant [phone]

## 2020-09-13 ENCOUNTER — Telehealth: Payer: Self-pay

## 2020-09-13 ENCOUNTER — Other Ambulatory Visit: Payer: Self-pay | Admitting: Family Medicine

## 2020-09-13 MED ORDER — GABAPENTIN 300 MG PO CAPS: ORAL_CAPSULE | ORAL | 2 refills | Status: DC

## 2020-09-13 NOTE — Telephone Encounter (Signed)
Rx request for gabapentin 300mg  capsule.  Please advise  [name]

## 2020-09-20 ENCOUNTER — Telehealth: Payer: Self-pay | Admitting: Pharmacist

## 2020-09-20 NOTE — Progress Notes (Signed)
Chronic Care Management Pharmacy Assistant   Name: [name]  MRN: [id number] DOB: 02-May-1951  Reason for Encounter: COPD Disease State  Patient Questions:  1.  Have you seen any other providers since your last visit? No  2.  Any changes in your medicines or health? No    PCP : [name], MD   Their chronic conditions include: HTN, CAD, COPD,  Type II DM, Depression.  Office Visits: None since their last CCM outreach with the clinical pharmacist on 08-23-2020.  Consults: None since their last CCM outreach with the clinical pharmacist on 08-23-2020.  Allergies:   Allergies  Allergen Reactions  . Actos [Pioglitazone] Swelling    EDEMA REACTION UNSPECIFIED  . Avandia [Rosiglitazone] Swelling    SWELLING REACTION UNSPECIFIED   . Dilaudid [Hydromorphone Hcl] Other (See Comments)    Made him crazy    Medications: Outpatient Encounter Medications as of 09/20/2020  Medication Sig  . acetaminophen (TYLENOL) 500 MG tablet Take 1,000 mg by mouth every 6 (six) hours as needed for moderate pain.  Marland Kitchen atorvastatin (LIPITOR) 40 MG tablet Take 40 mg by mouth daily.  Marland Kitchen diltiazem (CARDIZEM CD) 120 MG 24 hr capsule TAKE 1 CAPSULE BY MOUTH EVERY DAY  . donepezil (ARICEPT) 5 MG tablet TAKE 1 TABLET BY MOUTH EVERYDAY AT BEDTIME  . FLUoxetine (PROZAC) 40 MG capsule TAKE 1 CAPSULE BY MOUTH EVERY DAY  . fluticasone (FLONASE) 50 MCG/ACT nasal spray Place 2 sprays into both nostrils as needed.   . gabapentin (NEURONTIN) 300 MG capsule TAKE 1 CAPSULE BY MOUTH THREE TIMES A DAY  . glucose blood (ONETOUCH ULTRA) test strip TEST ONCE DAILY E11.42  . ipratropium-albuterol (DUONEB) 0.5-2.5 (3) MG/3ML SOLN USE 1 VIAL IN NEBULIZER 4 TIMES A DAY  . levofloxacin (LEVAQUIN) 500 MG tablet Take 1 tablet (500 mg total) by mouth daily.  Marland Kitchen losartan (COZAAR) 25 MG tablet Take 25 mg by mouth daily.  . Multiple Vitamin (MULTIVITAMIN WITH MINERALS) TABS Take 1 tablet by mouth daily.  . nitroGLYCERIN  (NITROSTAT) 0.4 MG SL tablet Place 0.4 mg under the tongue every 5 (five) minutes as needed for chest pain.  Marland Kitchen ondansetron (ZOFRAN) 4 MG tablet Take 1 tablet (4 mg total) by mouth every 8 (eight) hours as needed for nausea or vomiting.  . ONE TOUCH ULTRA TEST test strip   . Tiotropium Bromide Monohydrate (SPIRIVA RESPIMAT) 2.5 MCG/ACT AERS Inhale 2 puffs into the lungs daily.  . traZODone (DESYREL) 100 MG tablet TAKE 1 TABLET BY MOUTH EVERYDAY AT BEDTIME  . [DISCONTINUED] albuterol (VENTOLIN HFA) 108 (90 Base) MCG/ACT inhaler TAKE 2 PUFFS BY MOUTH EVERY 6 HOURS AS NEEDED FOR WHEEZE OR SHORTNESS OF BREATH   Facility-Administered Encounter Medications as of 09/20/2020  Medication  . albuterol (PROVENTIL) (2.5 MG/3ML) 0.083% nebulizer solution 2.5 mg    Current Diagnosis: Patient Active Problem List   Diagnosis Date Noted  . Rib fracture 08/05/2019  . Primary cancer of left upper lobe of lung ([place]) 01/13/2019  . 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 Chronic sinusitis 07/04/2017  . 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 CAD (coronary artery disease), native coronary artery 09/18/2015  . Orthostatic hypotension 07/20/2015  . Smoking 02/26/2015  . Neurocardiogenic syncope 05/26/2013  . Seizures ([place]) 05/03/2013  . Chronic kidney disease   . Type II diabetes mellitus ([place])   .  OSA on CPAP 09/26/2012  . Neuropathic pain of both legs 04/23/2012  . S/P lobectomy of lung 04/04/2012  . Non-small cell carcinoma of lung, stage 1 ([place]) 03/17/2012    Class: Stage 1  . Chest pain, atypical 12/21/2011  . COPD exacerbation ([place]) 09/20/2011  . Hypertension   . Depression   . GERD (gastroesophageal reflux disease)   . CIGARETTE SMOKER 09/25/2007  . DIZZINESS AND GIDDINESS 09/25/2007  . Chronic obstructive pulmonary disease ([place]) 08/22/2007    Goals Addressed   None    . Current COPD regimen: Duoneb  nebulizers as needed  . No flowsheet data found.   . Any recent hospitalizations or ED visits since last visit with CPP? No   . Reports COPD symptoms, including Increased shortness of breath , Shortness of breath at rest and Symptoms worse at night   . What recent interventions/DTPs have been made by any provider to improve breathing since last visit: none  . Have you had exacerbation/flare-up since last visit? Yes Patient states he "chokes" multiple times a day everyday with his COPD. He now has a few cracked ribs that cause him pain. He is not able to sleep without medicated sleeping pills  . What do you do when you are short of breath?  Rescue medication and Rest  Respiratory Devices/Equipment . Do you have a nebulizer? Yes . Do you use a Peak Flow Meter? No . Do you use a maintenance inhaler? No . How often do you forget to use your daily inhaler? No . Do you use a rescue inhaler? No . How often do you use your rescue inhaler?  multiple times per day . Do you use a spacer with your inhaler? No  Adherence Review: . Does the patient have >5 day gap between last estimated fill date for maintenance inhaler medications? No CPP please confirm  Patient's PAP for maintenance inhaler did not meet income requirements due to household size and combined income. Still working on other alternatives.  Follow-Up:  Pharmacist Review   Fanny Skates, [place] Pharmacist Assistant [phone]

## 2020-10-12 ENCOUNTER — Ambulatory Visit: Payer: PPO | Admitting: Podiatry

## 2020-10-12 ENCOUNTER — Encounter: Payer: Self-pay | Admitting: Podiatry

## 2020-10-12 ENCOUNTER — Other Ambulatory Visit: Payer: Self-pay

## 2020-10-12 DIAGNOSIS — E1151 Type 2 diabetes mellitus with diabetic peripheral angiopathy without gangrene: Secondary | ICD-10-CM

## 2020-10-12 DIAGNOSIS — M2010 Hallux valgus (acquired), unspecified foot: Secondary | ICD-10-CM

## 2020-10-12 DIAGNOSIS — B351 Tinea unguium: Secondary | ICD-10-CM | POA: Diagnosis not present

## 2020-10-12 DIAGNOSIS — M79676 Pain in unspecified toe(s): Secondary | ICD-10-CM

## 2020-10-12 NOTE — Progress Notes (Signed)
This patient returns to my office for at risk foot care.  This patient requires this care by a professional since this patient will be at risk due to having diabetes.  History of throat cancer.  This patient is unable to cut nails himself since the patient cannot reach his nails.These nails are painful walking and wearing shoes.  This patient presents for at risk foot care today.  General Appearance  Alert, conversant and in no acute stress.  Vascular  Dorsalis pedis and posterior tibial  pulses are weakly  palpable  bilaterally.  Capillary return is within normal limits  bilaterally. Temperature is within normal limits  bilaterally.  Neurologic  Senn-Weinstein monofilament wire test diminished   bilaterally. Muscle power within normal limits bilaterally.  Nails Thick disfigured discolored nails with subungual debris  from hallux to fifth toes bilaterally. No evidence of bacterial infection or drainage bilaterally.  Orthopedic  No limitations of motion  feet .  No crepitus or effusions noted.  No bony pathology or digital deformities noted.HAV  B/L.  Skin  normotropic skin with no porokeratosis noted bilaterally.  No signs of infections or ulcers noted.     Onychomycosis  Pain in right toes  Pain in left toes  Consent was obtained for treatment procedures.   Mechanical debridement of nails 1-5  bilaterally performed with a nail nipper.  Filed with dremel without incident.    Return office visit    3 months                  Told patient to return for periodic foot care and evaluation due to potential at risk complications.   Gardiner Barefoot DPM

## 2020-11-05 NOTE — Progress Notes (Signed)
Cardiology Office Note:    Date:  11/10/2020   ID:  [name], DOB 19-Sep-1951, MRN [id number]  PCP:  [name], MD  Cardiologist:  Sinclair Grooms, MD   Referring MD: [name], MD   Chief Complaint  Patient presents with  . Coronary Artery Disease  . Congestive Heart Failure  . Shortness of Breath    History of Present Illness:    [name] is a 70 y.o. male with a hx of CAD with RCA DES (remote), lung cancer status post partial right lung resection, hypertension, tobacco abuse, COPD, hyperlipidemia, and essential hypertension  He is doing well.  He has lost significant weight over time.  Compared to 5 months ago he has gained 10 pounds.  Despite the 10 pound weight gain he is still somewhat frail appearing in comparison to prior.  He denies chest pain.  He does have left parasternal discomfort that is related to chronic cough and rib fracture.  He has had no tightness or anginal quality pain.  He has chronic shortness of breath and phlegm production.  He continues to smoke cigarette no lower extremity edema.  Past Medical History:  Diagnosis Date  . Allergy   . Anemia   . Anginal pain ([place])   . Arthritis    "hands"  . Asthma   . Back pain    4 deteriorating disc and receives an injection q3-105mon;has been doing this for about 70yrs  . Blood transfusion    as a child  . Bronchitis   . Cancer ([place]) dx'd 2013   Non-small cell lung cancer  . Chronic kidney disease    acute kidney failure post surgery  . COPD (chronic obstructive pulmonary disease) (HCC)    uses Albuterol and Spiriva daily  . Coronary artery disease    has 1 stent  . Depression    takes Prozac daily  . Emphysema    sees Dr.Ramaswami for this  . Family history of adverse reaction to anesthesia    mother was hard to wake up sometimes  . GERD (gastroesophageal reflux disease)    takes Prilosec daily, EGD nml 06/2017  . Glaucoma    hx of  . H/O hiatal hernia   .  Hypertension    takes  HYzaar daily  . Insomnia    takes Trazodone nightly  . Lung mass    right upper lobe  . Myocardial infarct ([place])   . 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 Obesity   . 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 Periodic limb movements of sleep   . Peripheral neuropathy   . Pneumonia    hx of' 70 yo,rd' last time in 2013  . Productive cough    white in color but no odor  . Shortness of breath    with exertion   . Sleep apnea    uses BiPaP; "no longer have sleep apnea since I have lost over 100 lbs"  . Syncope and collapse 05/26/2013  . Thyroid disease   . Type II diabetes mellitus (HCC)    takes Metformin bid and Novolog and Lantus daily    Past Surgical History:  Procedure Laterality Date  . CARDIAC CATHETERIZATION  2006/2008/2009  . CARPAL TUNNEL RELEASE     bilateral  . COLONOSCOPY    . CORONARY ANGIOPLASTY WITH STENT PLACEMENT     1 stent  . ELBOW SURGERY  ulnar nerve; left  . ESOPHAGOGASTRODUODENOSCOPY    . EYE SURGERY     pt. denies  . FUDUCIAL PLACEMENT Left 08/24/2016   Procedure: PLACEMENT OF FUDUCIAL;  Surgeon: Melrose Nakayama, MD;  Location: [facility];  Service: Thoracic;  Laterality: Left;  . INGUINAL HERNIA REPAIR  1990's   double inguinal  . LOBECTOMY  03/17/2012   upper right side with resection  . MICROLARYNGOSCOPY WITH CO2 LASER AND EXCISION OF VOCAL CORD LESION N/A 06/28/2017   Procedure: MICROLARYNGOSCOPY WITH BIOPSY;  Surgeon: Melida Quitter, MD;  Location: [facility];  Service: ENT;  Laterality: N/A;  . MICROLARYNGOSCOPY WITH CO2 LASER AND EXCISION OF VOCAL CORD LESION N/A 07/19/2017   Procedure: MICROLARYNGOSCOPY WITH CO2 LASER AND EXCISION OF VOCAL CORD LESION;  Surgeon: Melida Quitter, MD;  Location: [facility];  Service: ENT;  Laterality: N/A;  MICRO DIRECT LARYNGOSCOPY WITH CO2 LASER VOCAL CORD STRIPPING/POSS JET VENTURI VENTILATION  . REFRACTIVE SURGERY     bilaterally  . ROTATOR CUFF REPAIR     left  . VIDEO  BRONCHOSCOPY WITH ENDOBRONCHIAL NAVIGATION N/A 08/24/2016   Procedure: VIDEO BRONCHOSCOPY WITH ENDOBRONCHIAL NAVIGATION;  Surgeon: Melrose Nakayama, MD;  Location: [facility];  Service: Thoracic;  Laterality: N/A;    Current Medications: Current Meds  Medication Sig  . acetaminophen (TYLENOL) 500 MG tablet Take 1,000 mg by mouth every 6 (six) hours as needed for moderate pain.  Marland Kitchen atorvastatin (LIPITOR) 40 MG tablet Take 40 mg by mouth daily.  Marland Kitchen diltiazem (CARDIZEM CD) 120 MG 24 hr capsule TAKE 1 CAPSULE BY MOUTH EVERY DAY  . donepezil (ARICEPT) 5 MG tablet TAKE 1 TABLET BY MOUTH EVERYDAY AT BEDTIME  . FLUoxetine (PROZAC) 40 MG capsule TAKE 1 CAPSULE BY MOUTH EVERY DAY  . fluticasone (FLONASE) 50 MCG/ACT nasal spray Place 2 sprays into both nostrils as needed.   . gabapentin (NEURONTIN) 300 MG capsule TAKE 1 CAPSULE BY MOUTH THREE TIMES A DAY  . glucose blood (ONETOUCH ULTRA) test strip TEST ONCE DAILY E11.42  . ipratropium-albuterol (DUONEB) 0.5-2.5 (3) MG/3ML SOLN USE 1 VIAL IN NEBULIZER 4 TIMES A DAY  . losartan (COZAAR) 25 MG tablet Take 25 mg by mouth daily.  . Multiple Vitamin (MULTIVITAMIN WITH MINERALS) TABS Take 1 tablet by mouth daily.  . nitroGLYCERIN (NITROSTAT) 0.4 MG SL tablet Place 0.4 mg under the tongue every 5 (five) minutes as needed for chest pain.  Marland Kitchen ondansetron (ZOFRAN) 4 MG tablet Take 1 tablet (4 mg total) by mouth every 8 (eight) hours as needed for nausea or vomiting.  . ONE TOUCH ULTRA TEST test strip   . Tiotropium Bromide Monohydrate (SPIRIVA RESPIMAT) 2.5 MCG/ACT AERS Inhale 2 puffs into the lungs daily.  . traZODone (DESYREL) 100 MG tablet TAKE 1 TABLET BY MOUTH EVERYDAY AT BEDTIME   Current Facility-Administered Medications for the 11/10/20 encounter (Office Visit) with Belva Crome, MD  Medication  . albuterol (PROVENTIL) (2.5 MG/3ML) 0.083% nebulizer solution 2.5 mg     Allergies:   Actos [pioglitazone], Avandia [rosiglitazone], and Dilaudid [hydromorphone  hcl]   Social History   Socioeconomic History  . Marital status: Married    Spouse name: [name]  . Number of children: 3  . Years of education: trade   . Highest education level: Not on file  Occupational History  . Occupation: truck Geophysicist/field seismologist    Comment: retired  Tobacco Use  . Smoking status: Current Every Day Smoker    Packs/day: 1.50    Years: 54.00  Pack years: 81.00    Types: Cigarettes  . Smokeless tobacco: Never Used  . Tobacco comment: 1ppd as of 11/26/17  ep  Vaping Use  . Vaping Use: Former  Substance and Sexual Activity  . Alcohol use: No    Comment: no alcolol since 2013  . Drug use: Yes    Types: Cocaine, Marijuana    Comment: quit 1990's  . Sexual activity: Not Currently  Other Topics Concern  . 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   Social Determinants of Health   Financial Resource Strain: Low Risk   . Difficulty of Paying Living Expenses: Not very hard  Food Insecurity: Not on file  Transportation Needs: Not on file  Physical Activity: Not on file  Stress: Not on file  Social Connections: Not on file     Family History: The patient's family history includes COPD in his mother; Diabetes in his father and mother; Heart attack in his father; Hypertension in his mother. There is no history of Anesthesia problems, Hypotension, Malignant hyperthermia, or Pseudochol deficiency.  ROS:   Please see the history of present illness.    Smokes cigarettes.  Smells heavily of still smoke.  Chronic cough with phlegm production each day.  Smokes greater than a pack of cigarettes per day.  Does not drink alcohol anymore.  All other systems reviewed and are negative.  EKGs/Labs/Other Studies Reviewed:    The following studies were reviewed  today: Recent laboratory data: Creatinine 1.5, hemoglobin 13.3, LDL 63, and potassium 4.1 .May 2021.  EKG:  EKG the most recent EKG was performed in May 2021 and demonstrates normal sinus rhythm, biatrial abnormality, and prominent voltage.  A new tracing is not performed today.  Recent Labs: 03/23/2020: ALT 18 03/29/2020: BUN 37; Creat 1.51; Hemoglobin 13.3; Platelets 204; Potassium 4.1; Sodium 138  Recent Lipid Panel    Component Value Date/Time   CHOL 119 03/29/2020 0918   TRIG 87 03/29/2020 0918   HDL 39 (L) 03/29/2020 0918   CHOLHDL 3.1 03/29/2020 0918   VLDL 20 05/10/2017 0850   LDLCALC 63 03/29/2020 0918    Physical Exam:    VS:  BP 124/62   Pulse 73   Ht 5\' 11"  (1.803 m)   Wt 173 lb 6.4 oz (78.7 kg)   SpO2 100%   BMI 24.18 kg/m     Wt Readings from Last 3 Encounters:  11/10/20 173 lb 6.4 oz (78.7 kg)  03/29/20 174 lb (78.9 kg)  03/23/20 164 lb (74.4 kg)     GEN: Somewhat frail comparison to prior phenotype.. No acute distress HEENT: Normal NECK: No JVD. LYMPHATICS: No lymphadenopathy CARDIAC: No murmur. RRR no gallop, or edema. VASCULAR:  Normal Pulses. No bruits. RESPIRATORY: Rhonchi heard throughout both lungs.  Partially clears with cough. ABDOMEN: Soft, non-tender, non-distended, No pulsatile mass, MUSCULOSKELETAL: No deformity  SKIN: Warm and dry NEUROLOGIC:  Alert and oriented x 3 PSYCHIATRIC:  Normal affect   ASSESSMENT:    1. Coronary artery disease involving native coronary artery of native heart without angina pectoris   2. Neurocardiogenic syncope   3. Essential hypertension   4. OSA on CPAP   5. Non-small cell carcinoma of  lung, stage 1 ([place])   6. Type 2 diabetes mellitus with other circulatory complication, with long-term current use of insulin ([place])   7. COPD exacerbation ([place])   8. CIGARETTE SMOKER   9. Educated about COVID-19 virus infection    PLAN:    In order of problems listed above:  1. Reviewed secondary risk factor  modification. 2. No recurrent episodes of syncope. 3. Excellent blood pressure with target 130/80 mmHg. 4. Semicompliant with CPAP. 5. Being followed.  Stage I: Resected. 6. Most recent hemoglobin A1c is not available. 7. Smoking cessation encouraged 8. As above 9. Covid vaccinated and boosted.  Practicing mitigation.  Overall education and awareness concerning secondary risk prevention was discussed in detail: LDL less than 70, hemoglobin A1c less than 7, blood pressure target less than 130/80 mmHg, >150 minutes of moderate aerobic activity per week, avoidance of smoking, weight control (via diet and exercise), and continued surveillance/management of/for obstructive sleep apnea.   Medication Adjustments/Labs and Tests Ordered: Current medicines are reviewed at length with the patient today.  Concerns regarding medicines are outlined above.  No orders of the defined types were placed in this encounter.  No orders of the defined types were placed in this encounter.   Patient Instructions  Medication Instructions:  Your physician recommends that you continue on your current medications as directed. Please refer to the Current Medication list given to you today.  *If you need a refill on your cardiac medications before your next appointment, please call your pharmacy*   Lab Work: None If you have labs (blood work) drawn today and your tests are completely normal, you will receive your results only by: Marland Kitchen MyChart Message (if you have MyChart) OR . A paper copy in the mail If you have any lab test that is abnormal or we need to change your treatment, we will call you to review the results.   Testing/Procedures: None   Follow-Up: At [facility] [place], you and your health needs are our priority.  As part of our continuing mission to provide you with exceptional heart care, we have created designated Provider Care Teams.  These Care Teams include your primary Cardiologist (physician) and  Advanced Practice Providers (APPs -  Physician Assistants and Nurse Practitioners) who all work together to provide you with the care you need, when you need it.  We recommend signing up for the patient portal called "MyChart".  Sign up information is provided on this After Visit Summary.  MyChart is used to connect with patients for Virtual Visits (Telemedicine).  Patients are able to view lab/test results, encounter notes, upcoming appointments, etc.  Non-urgent messages can be sent to your provider as well.   To learn more about what you can do with MyChart, go to [email or web address].    Your next appointment:   1 year(s)  The format for your next appointment:   In Person  Provider:   You may see Sinclair Grooms, MD or one of the following Advanced Practice Providers on your designated Care Team:    Truitt Merle, NP  Cecilie Kicks, NP  Kathyrn Drown, NP    Other Instructions      Signed, Sinclair Grooms, MD  11/10/2020 9:57 AM    [place]

## 2020-11-08 ENCOUNTER — Ambulatory Visit: Payer: Self-pay | Admitting: Radiation Oncology

## 2020-11-09 ENCOUNTER — Other Ambulatory Visit: Payer: Self-pay | Admitting: Family Medicine

## 2020-11-10 ENCOUNTER — Ambulatory Visit: Payer: PPO | Admitting: Interventional Cardiology

## 2020-11-10 ENCOUNTER — Other Ambulatory Visit: Payer: Self-pay

## 2020-11-10 ENCOUNTER — Encounter: Payer: Self-pay | Admitting: Interventional Cardiology

## 2020-11-10 VITALS — BP 124/62 | HR 73 | Ht 71.0 in | Wt 173.4 lb

## 2020-11-10 DIAGNOSIS — J441 Chronic obstructive pulmonary disease with (acute) exacerbation: Secondary | ICD-10-CM | POA: Diagnosis not present

## 2020-11-10 DIAGNOSIS — I2510 Atherosclerotic heart disease of native coronary artery without angina pectoris: Secondary | ICD-10-CM

## 2020-11-10 DIAGNOSIS — G4733 Obstructive sleep apnea (adult) (pediatric): Secondary | ICD-10-CM

## 2020-11-10 DIAGNOSIS — R55 Syncope and collapse: Secondary | ICD-10-CM | POA: Diagnosis not present

## 2020-11-10 DIAGNOSIS — E1159 Type 2 diabetes mellitus with other circulatory complications: Secondary | ICD-10-CM

## 2020-11-10 DIAGNOSIS — F17200 Nicotine dependence, unspecified, uncomplicated: Secondary | ICD-10-CM

## 2020-11-10 DIAGNOSIS — Z7189 Other specified counseling: Secondary | ICD-10-CM | POA: Diagnosis not present

## 2020-11-10 DIAGNOSIS — Z9989 Dependence on other enabling machines and devices: Secondary | ICD-10-CM | POA: Diagnosis not present

## 2020-11-10 DIAGNOSIS — Z794 Long term (current) use of insulin: Secondary | ICD-10-CM | POA: Diagnosis not present

## 2020-11-10 DIAGNOSIS — C3411 Malignant neoplasm of upper lobe, right bronchus or lung: Secondary | ICD-10-CM | POA: Diagnosis not present

## 2020-11-10 DIAGNOSIS — I10 Essential (primary) hypertension: Secondary | ICD-10-CM

## 2020-11-10 NOTE — Patient Instructions (Signed)
Medication Instructions:  Your physician recommends that you continue on your current medications as directed. Please refer to the Current Medication list given to you today.  *If you need a refill on your cardiac medications before your next appointment, please call your pharmacy*   Lab Work: None If you have labs (blood work) drawn today and your tests are completely normal, you will receive your results only by: Marland Kitchen MyChart Message (if you have MyChart) OR . A paper copy in the mail If you have any lab test that is abnormal or we need to change your treatment, we will call you to review the results.   Testing/Procedures: None   Follow-Up: At [facility], you and your health needs are our priority.  As part of our continuing mission to provide you with exceptional heart care, we have created designated Provider Care Teams.  These Care Teams include your primary Cardiologist (physician) and Advanced Practice Providers (APPs -  Physician Assistants and Nurse Practitioners) who all work together to provide you with the care you need, when you need it.  We recommend signing up for the patient portal called "MyChart".  Sign up information is provided on this After Visit Summary.  MyChart is used to connect with patients for Virtual Visits (Telemedicine).  Patients are able to view lab/test results, encounter notes, upcoming appointments, etc.  Non-urgent messages can be sent to your provider as well.   To learn more about what you can do with MyChart, go to [email or web address].    Your next appointment:   1 year(s)  The format for your next appointment:   In Person  Provider:   You may see Sinclair Grooms, MD or one of the following Advanced Practice Providers on your designated Care Team:    Truitt Merle, NP  Cecilie Kicks, NP  Kathyrn Drown, NP    Other Instructions

## 2020-11-16 ENCOUNTER — Ambulatory Visit
Admission: RE | Admit: 2020-11-16 | Discharge: 2020-11-16 | Disposition: A | Discharge status: Home / Self Care | Payer: PPO | Source: Ambulatory Visit | Attending: Radiation Oncology | Admitting: Radiation Oncology

## 2020-11-16 ENCOUNTER — Other Ambulatory Visit: Payer: Self-pay

## 2020-11-16 DIAGNOSIS — J181 Lobar pneumonia, unspecified organism: Secondary | ICD-10-CM | POA: Diagnosis not present

## 2020-11-16 DIAGNOSIS — S2231XA Fracture of one rib, right side, initial encounter for closed fracture: Secondary | ICD-10-CM | POA: Diagnosis not present

## 2020-11-16 DIAGNOSIS — I2510 Atherosclerotic heart disease of native coronary artery without angina pectoris: Secondary | ICD-10-CM | POA: Diagnosis not present

## 2020-11-16 DIAGNOSIS — S2232XA Fracture of one rib, left side, initial encounter for closed fracture: Secondary | ICD-10-CM | POA: Diagnosis not present

## 2020-11-16 DIAGNOSIS — C3411 Malignant neoplasm of upper lobe, right bronchus or lung: Secondary | ICD-10-CM

## 2020-11-16 MED ORDER — IOPAMIDOL (ISOVUE-300) INJECTION 61%
75.0000 mL | Freq: Once | INTRAVENOUS | Status: AC | PRN
  Administered 2020-11-16: 60 mL via INTRAVENOUS

## 2020-11-16 NOTE — Chronic Care Management (AMB) (Signed)
Chronic Care Management Pharmacy  Name: ARMIN YERGER  MRN: [id number] DOB: Jul 29, 1951  Chief Complaint/ HPI  Nash Dimmer,  70 y.o. , male presents for their Follow-Up CCM visit with the clinical pharmacist via telephone.  PCP : [name], MD  Their chronic conditions include: HTN, CAD, COPD,  Type II DM, Depression. Office Visits: 03/29/2020 ([facility]) -   Follow up from previous week  Patient feeling much better, medications finished  No changes, patient appeared almost back at baseline  03/23/2020 [facility]) -   N/V, fever, refuses to go to ER  Has not taken regular medications in a few days  WBC was extremely elevated, given IV fluids in office  He ended up agreeing to go to hospital  Consult Visit: 07/15/2020 [facility], [facility]) -   Patient cannot reach his feet to take care of his nails and they are painful while walking or wearing shoes  Clipped and filed with no issues, regular follow up  03/24/2020 ([facility]) - note not available Medications: Outpatient Encounter Medications as of 11/30/2020  Medication Sig  . acetaminophen (TYLENOL) 500 MG tablet Take 1,000 mg by mouth every 6 (six) hours as needed for moderate pain.  Marland Kitchen atorvastatin (LIPITOR) 40 MG tablet Take 40 mg by mouth daily.  Marland Kitchen diltiazem (CARDIZEM CD) 120 MG 24 hr capsule TAKE 1 CAPSULE BY MOUTH EVERY DAY  . donepezil (ARICEPT) 5 MG tablet TAKE 1 TABLET BY MOUTH EVERYDAY AT BEDTIME  . FLUoxetine (PROZAC) 40 MG capsule TAKE 1 CAPSULE BY MOUTH EVERY DAY  . fluticasone (FLONASE) 50 MCG/ACT nasal spray Place 2 sprays into both nostrils as needed.   . gabapentin (NEURONTIN) 300 MG capsule TAKE 1 CAPSULE BY MOUTH THREE TIMES A DAY  . glucose blood (ONETOUCH ULTRA) test strip TEST ONCE DAILY E11.42  . ipratropium-albuterol (DUONEB) 0.5-2.5 (3) MG/3ML SOLN USE 1 VIAL IN NEBULIZER 4 TIMES A DAY  . losartan (COZAAR) 25 MG tablet Take 25 mg by mouth daily.  . Multiple Vitamin (MULTIVITAMIN WITH  MINERALS) TABS Take 1 tablet by mouth daily.  . nitroGLYCERIN (NITROSTAT) 0.4 MG SL tablet Place 0.4 mg under the tongue every 5 (five) minutes as needed for chest pain.  Marland Kitchen ondansetron (ZOFRAN) 4 MG tablet Take 1 tablet (4 mg total) by mouth every 8 (eight) hours as needed for nausea or vomiting.  . ONE TOUCH ULTRA TEST test strip   . Tiotropium Bromide Monohydrate (SPIRIVA RESPIMAT) 2.5 MCG/ACT AERS Inhale 2 puffs into the lungs daily.  . traZODone (DESYREL) 100 MG tablet TAKE 1 TABLET BY MOUTH EVERYDAY AT BEDTIME  . [DISCONTINUED] albuterol (VENTOLIN HFA) 108 (90 Base) MCG/ACT inhaler TAKE 2 PUFFS BY MOUTH EVERY 6 HOURS AS NEEDED FOR WHEEZE OR SHORTNESS OF BREATH   Facility-Administered Encounter Medications as of 11/30/2020  Medication  . albuterol (PROVENTIL) (2.5 MG/3ML) 0.083% nebulizer solution 2.5 mg     Current Diagnosis/Assessment: Financial Resource Strain: Low Risk   . Difficulty of Paying Living Expenses: Not very hard    Goals Addressed            This Visit's Progress   . Pharmacy Care Plan:       CARE PLAN ENTRY (see longitudinal plan of care for additional care plan information)  Current Barriers:  . Chronic Disease Management support, education, and care coordination needs related to Hypertension, Diabetes, COPD, and Depression   Hypertension BP Readings from Last 3 Encounters:  11/10/20 124/62  03/29/20 (!) 136/56  03/24/20 (!) 142/92   .  Pharmacist Clinical Goal(s): o Over the next 180 days, patient will work with PharmD and providers to maintain BP goal <140/90 . Current regimen:  o Diltiazem CD 120mg  o Losartan 25mg  . Interventions: o Reviewed home monitoring o Discussed medication adherence . Patient self care activities - Over the next 180 days, patient will: o Check BP periodically, document, and provide at future appointments o Ensure daily salt intake < 2300 mg/day  Diabetes Lab Results  Component Value Date/Time   HGBA1C 5.9 (H)  03/29/2020 09:18 AM   HGBA1C 7.2 (H) 08/23/2016 12:44 PM   . Pharmacist Clinical Goal(s): o Over the next 180 days, patient will work with PharmD and providers to maintain A1c goal <7% . Current regimen:  o No medications . Interventions: o Reviewed home blood sugar logs o Discussed diabetic diet modifications . Patient self care activities - Over the next 180 days, patient will: o Check blood sugar once daily, document, and provide at future appointments o Continue current lifestyle modifications as current.  COPD . Pharmacist Clinical Goal(s) o Over the next 180 days, patient will work with PharmD and providers to optimize medication and minimize symptoms of COPD. . Current regimen:  o Duoneb nebulizers as needed . Interventions: o Reviewed frequency of other inhaler use o Evaluated for qualification of PAP programs  o Discussed need for regular maintenance inhaler . Patient self care activities - Over the next 180 days, patient will: o Gather documents for patient assistance documentation o Contact providers with any sudden change in symptoms  Depression . Pharmacist Clinical Goal(s) o Over the next 180 days, patient will work with PharmD and providers to optimize medication and minimize symptoms of depression. . Current regimen:  o Fluoxetine 40mg  o Trazodone 100mg  hs . Interventions: o Discussed current symptoms and mood o Reviewed sleep patterns, consult with PCP on increased dose of trazodone . Patient self care activities - Over the next 180 days, patient will: o Await new Rx for trazodone if approved  Please see past updates related to this goal by clicking on the "Past Updates" button in the selected goal         COPD    Eosinophil count:   Lab Results  Component Value Date/Time   EOSPCT 0.6 03/29/2020 09:18 AM   EOSPCT 1.7 09/18/2017 10:14 AM  %                               Eos (Absolute):  Lab Results  Component Value Date/Time   EOSABS 83  03/29/2020 09:18 AM   EOSABS 0.2 09/18/2017 10:14 AM    Tobacco Status:  Social History   Tobacco Use  Smoking Status Current Every Day Smoker  . Packs/day: 1.50  . Years: 54.00  . Pack years: 81.00  . Types: Cigarettes  Smokeless Tobacco Never Used  Tobacco Comment   1ppd as of 11/26/17  ep    Patient has failed these meds in past: none noted Patient is currently uncontrolled on the following medications:   Duoneb 0.5-2.38mc/3ml Using maintenance inhaler regularly? No Frequency of rescue inhaler use:  never, uses albuterol nebs daily  He is currently not using any of his inhalers due to cost, only nebulizers.  He reports increased mucus and phlegm especially at night time.  Reports increased cough as of late.  Patient needs to be on maintenance inhaler based on current symptoms.  Only on SSI, so I will will begin  completing PAP for [employer] while they are gathering documents for proof of income.  Update 11/30/20 Still experiencing this phlegm at night time, aware that this is result of radiation Still no maintenance inhaler as they have all been a cost burden Using Duonebs twice daily most days Patient is barely over the income limit to apply for PAP programs He reports breathing is controlled, only SOB he experiences is when he gets outside and over exerts himself  Plan  Continue current medications  Contact providers if breathing worsens  Hypertension   BP goal is:  <140/90  Office blood pressures are  BP Readings from Last 3 Encounters:  11/10/20 124/62  03/29/20 (!) 136/56  03/24/20 (!) 142/92   Patient checks BP at home infrequently Patient home BP readings are ranging: no logs available  Patient has failed these meds in the past: none noted Patient is currently controlled on the following medications:  . Diltiazem CD 120mg  daily . Losartan 25mg   He does not currently check BP at home, but does have access to a cuff.  Recommended he check periodically and  call us if > 140/90.  Office BP has been borderline.  He denies dizziness, headaches.  Adherent to medication daily using a pill box.   Update 11/30/20 Reports BP has been controlled, no specific logs Continues medication adherence  Plan  Continue current medications, initiate monitoring plan.      Diabetes   A1c goal <6.5%  Recent Relevant Labs: Lab Results  Component Value Date/Time   HGBA1C 5.9 (H) 03/29/2020 09:18 AM   HGBA1C 7.2 (H) 08/23/2016 12:44 PM   GFR 41.76 (L) 04/30/2013 04:00 PM   GFR 47.34 (L) 01/14/2013 05:07 PM    Last diabetic Eye exam:  Lab Results  Component Value Date/Time   HMDIABEYEEXA No Retinopathy 07/08/2020 04:42 PM    Last diabetic Foot exam:  Lab Results  Component Value Date/Time   HMDIABFOOTEX Dr Buddy Duty 12/08/2012 12:00 AM     Checking BG: Daily  Recent FBG Readings: 125 is the highest it has been   Patient has failed these meds in past: pioglitazone (swelling)  Patient is currently controlled on the following medications: . None currently  Blood sugar is well controlled on no medications.  Encouraged him to continue current dietary restrictions as it seems to be keeping sugar at goal.  Update 11/30/20 Reports 125 is the highest his fasting sugar has been Denies any hypoglycemia Most recent A1c well controlled  Plan  Continue current dietary choices and blood sugar monitoring. CAD   Patient has failed these meds in past: none noted Patient is currently controlled on the following medications:  . None noted  Controlling risk factors.   Plan  Continue current medications   Depression   Patient has failed these meds in past: none noted Patient is currently uncontrolled on the following medications:  . Fluoxetine 40mg  . Trazodone 100mg  hs   He reports that he needs additional medication for depression/agitation.  He reports he gets mean almost daily.  Mentioned he was about to double up on his Prozaac dose and I  instructed him not to do that as he would be over the maximum dose.  Encouraged him to get appointment with Dr. Dennard Schaumann if he feels he needs something additional.  Update 11/30/20 Patient reports his best sleep is when he takes Trazodone 100mg  but he takes two tablets in order for it to work. Will consult with Dr. Dennard Schaumann on increasing his dose to two  tablets hs  Plan  Continue current medications, follow up with PCP if something additional is needed for agitation.  Vaccines   Reviewed and discussed patient's vaccination history.    Immunization History  Administered Date(s) Administered  . Fluad Quad(high Dose 65+) 08/11/2019  . Influenza Split 08/21/2012, 08/25/2013  . Influenza Whole 06/06/2009, 07/07/2011  . Influenza, High Dose Seasonal PF 08/13/2017, 09/01/2018  . Influenza,inj,Quad PF,6+ Mos 10/25/2014, 08/17/2016  . Influenza-Unspecified 08/09/2010, 08/13/2017  . PFIZER(Purple Top)SARS-COV-2 Vaccination 12/28/2019, 01/18/2020  . Pneumococcal Conjugate-13 10/02/2013  . Pneumococcal Polysaccharide-23 11/11/2008, 08/11/2019  . Tdap 09/09/2012  . Zoster 09/09/2012    Plan  Recommended patient receive Shingrix vaccine in office.  Medication Management   . Miscellaneous medications:  o Fluticasone 60mcg o Nitroglycerin 0.4mg  SL . OTC's:  o Tylenol 500mg  prn o MVI . Patient currently uses [facility].  Phone #  [phone] . Patient reports using pill box method to organize medications and promote adherence. . Patient denies missed doses of medication.   Beverly Milch, PharmD Clinical Pharmacist [facility] [phone]

## 2020-11-21 ENCOUNTER — Ambulatory Visit
Admission: RE | Admit: 2020-11-21 | Discharge: 2020-11-21 | Disposition: A | Discharge status: Home / Self Care | Payer: PPO | Source: Ambulatory Visit | Attending: Radiation Oncology | Admitting: Radiation Oncology

## 2020-11-21 ENCOUNTER — Other Ambulatory Visit: Payer: Self-pay

## 2020-11-21 DIAGNOSIS — C3412 Malignant neoplasm of upper lobe, left bronchus or lung: Secondary | ICD-10-CM

## 2020-11-21 DIAGNOSIS — C320 Malignant neoplasm of glottis: Secondary | ICD-10-CM

## 2020-11-21 DIAGNOSIS — S2243XA Multiple fractures of ribs, bilateral, initial encounter for closed fracture: Secondary | ICD-10-CM

## 2020-11-21 DIAGNOSIS — C3411 Malignant neoplasm of upper lobe, right bronchus or lung: Secondary | ICD-10-CM

## 2020-11-21 DIAGNOSIS — Z87891 Personal history of nicotine dependence: Secondary | ICD-10-CM | POA: Diagnosis not present

## 2020-11-21 NOTE — Progress Notes (Signed)
Radiation Oncology         [phone] ________________________________  Outpatient Follow Up - Conducted via telephone due to current COVID-19 concerns for limiting patient exposure  I spoke with the patient to conduct this consult visit via telephone to spare the patient unnecessary potential exposure in the healthcare setting during the current COVID-19 pandemic. The patient was notified in advance and was offered a [place] meeting to allow for face to face communication but unfortunately reported that they did not have the appropriate resources/technology to support such a visit and instead preferred to proceed with a telephone visit.   ________________________________  Name: [name]        MRN: [id number]  Date of Service: 11/21/2020 DOB: 1951/02/17  GE:[id number], Cammie Mcgee, MD  Melida Quitter, MD     REFERRING PHYSICIAN: Melida Quitter, MD   DIAGNOSIS: The encounter diagnosis was Non-small cell carcinoma of lung, stage 1 ([place]).   HISTORY OF PRESENT ILLNESS: [name] is a 70 y.o. male with a history of Stage IA, pT1aN0M0 NSCLC, SCC of the RUL s/p lobectomy in 2013 with Dr. [name]. He developed a new nodule in 2017 in the LLL and a biopsy of this site was also consistent with NSCLC, SCC. He received SBRT to this site and was found in surveillance to have a PET positive lesion in the right vocal cord. 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 He has been followed in surveillance with Dr. Redmond Baseman and is NED. He was treated in April 2020 for a putative Stage IB lesion in the LUL with radiotherapy and has been followed in surveillance since. Of note he has chronic left chest wall pain for several years since completing his SBRT to the LLL, felt to be post radiotherapy induced chondritis, more recently radiation induced instability fracture. It was finally noted to be fractured in 2020 by imaging. He also has chronic hoarseness from his  prior radiotherapy to the larynx.   He has remained NED but he continues to have chronic rib fractures from prior therapy.  He also continues to have chronic thick saliva from his prior head and neck treatment.  He follows with Dr. Redmond Baseman still and alternates every 6 months.  He has not been back to see Dr. Chase Caller out of concerns that he does not want to resume oxygen.  His oxygen levels at home however have been reported in the normal range.  During our last conversation he was going to think about meeting with an interventional pain specialist, he decided not to pursue this, and he already takes Neurontin so this has not been adjusted.  He is contacted today to review the results of his CT scan which was performed on 11/16/2020.  This revealed unchanged postoperative changes of the right chest consistent with his prior right upper lobectomy, unchanged postradiation consolidation in the lower lobe of the left lung, unchanged paratracheal and AP window lymph nodes that remain enlarged, no evidence of recurrent or metastatic disease in the chest and unchanged chronic displaced fracture of the posterior left seventh rib and lateral right sixth rib.   On review of systems the patient reports that he continues to have fatigue and hoarseness.  He does have cough and has pain in his chest wall when he is coughing as a result of his rib fractures.  He is not interested at this time and referral to pain management for interventional procedures.  He  is contemplating getting him back to see Dr. Chase Caller but has avoided this out of again fear for being put back on oxygen which she would like to avoid.  He only feels short of breath when he is up and moving and exerting himself.  He is in need of a new pair of dentures and plans to follow-up with his dental office for this.  He denies any chest pain or shortness of breath today, fevers or chills, their daughter recently had COVID-19 but fortunately they were not exposed  and did not contract it.  No other complaints are verbalized.   MOST RECENT RADIATION THERAPY:   02/10/2019-02/18/2019 SBRT Treatment: The target in the LUL was treated to 54 Gy in 3 fractions of 18 Gy  08/26/2017 - 10/04/2017: The larynx was treated to 63 Gy in 28 fractions of 2.25 Gy.  09/17/2016 - 09/24/2016 SBRT Treatment: TheLLLtarget was treated to 54Gy in 46fractions of 18Gy  PAST MEDICAL HISTORY:  Past Medical History:  Diagnosis Date  . Allergy   . Anemia   . Anginal pain ([place])   . Arthritis    "hands"  . Asthma   . Back pain    4 deteriorating disc and receives an injection q3-35mon;has been doing this for about 59yrs  . Blood transfusion    as a child  . Bronchitis   . Cancer ([place]) dx'd 2013   Non-small cell lung cancer  . Chronic kidney disease    acute kidney failure post surgery  . COPD (chronic obstructive pulmonary disease) (HCC)    uses Albuterol and Spiriva daily  . Coronary artery disease    has 1 stent  . Depression    takes Prozac daily  . Emphysema    sees Dr.Ramaswami for this  . Family history of adverse reaction to anesthesia    mother was hard to wake up sometimes  . GERD (gastroesophageal reflux disease)    takes Prilosec daily, EGD nml 06/2017  . Glaucoma    hx of  . H/O hiatal hernia   . Hypertension    takes  HYzaar daily  . Insomnia    takes Trazodone nightly  . Lung mass    right upper lobe  . Myocardial infarct ([place])   . 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 Obesity   . 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 Periodic limb movements of sleep   . Peripheral neuropathy   . Pneumonia    hx of' 70 yo,rd' last time in 2013  . Productive cough    white in color but no odor  . Shortness of breath    with exertion   . Sleep apnea    uses BiPaP; "no longer have sleep apnea since I have lost over 100 lbs"  . Syncope and collapse 05/26/2013  . Thyroid disease   . Type II diabetes mellitus (HCC)    takes  Metformin bid and Novolog and Lantus daily       PAST SURGICAL HISTORY: Past Surgical History:  Procedure Laterality Date  . CARDIAC CATHETERIZATION  2006/2008/2009  . CARPAL TUNNEL RELEASE     bilateral  . COLONOSCOPY    . CORONARY ANGIOPLASTY WITH STENT PLACEMENT     1 stent  . ELBOW SURGERY     ulnar nerve; left  . ESOPHAGOGASTRODUODENOSCOPY    . EYE SURGERY     pt. denies  . FUDUCIAL PLACEMENT Left 08/24/2016   Procedure: PLACEMENT OF  FUDUCIAL;  Surgeon: Melrose Nakayama, MD;  Location: [facility];  Service: Thoracic;  Laterality: Left;  . INGUINAL HERNIA REPAIR  1990's   double inguinal  . LOBECTOMY  03/17/2012   upper right side with resection  . MICROLARYNGOSCOPY WITH CO2 LASER AND EXCISION OF VOCAL CORD LESION N/A 06/28/2017   Procedure: MICROLARYNGOSCOPY WITH BIOPSY;  Surgeon: Melida Quitter, MD;  Location: [facility];  Service: ENT;  Laterality: N/A;  . MICROLARYNGOSCOPY WITH CO2 LASER AND EXCISION OF VOCAL CORD LESION N/A 07/19/2017   Procedure: MICROLARYNGOSCOPY WITH CO2 LASER AND EXCISION OF VOCAL CORD LESION;  Surgeon: Melida Quitter, MD;  Location: [facility];  Service: ENT;  Laterality: N/A;  MICRO DIRECT LARYNGOSCOPY WITH CO2 LASER VOCAL CORD STRIPPING/POSS JET VENTURI VENTILATION  . REFRACTIVE SURGERY     bilaterally  . ROTATOR CUFF REPAIR     left  . VIDEO BRONCHOSCOPY WITH ENDOBRONCHIAL NAVIGATION N/A 08/24/2016   Procedure: VIDEO BRONCHOSCOPY WITH ENDOBRONCHIAL NAVIGATION;  Surgeon: Melrose Nakayama, MD;  Location: [facility];  Service: Thoracic;  Laterality: N/A;     FAMILY HISTORY:  Family History  Problem Relation Age of Onset  . COPD Mother   . Diabetes Mother   . Hypertension Mother   . Heart attack Father   . Diabetes Father   . Anesthesia problems Neg Hx   . Hypotension Neg Hx   . Malignant hyperthermia Neg Hx   . Pseudochol deficiency Neg Hx      SOCIAL HISTORY:  reports that he has been smoking cigarettes. He has a 81.00 pack-year smoking history. He  has never used smokeless tobacco. He reports current drug use. Drugs: Cocaine and Marijuana. He reports that he does not drink alcohol. The patient is married and lives in [place].    ALLERGIES: Actos [pioglitazone], Avandia [rosiglitazone], and Dilaudid [hydromorphone hcl]   MEDICATIONS:  Current Outpatient Medications  Medication Sig Dispense Refill  . acetaminophen (TYLENOL) 500 MG tablet Take 1,000 mg by mouth every 6 (six) hours as needed for moderate pain.    Marland Kitchen atorvastatin (LIPITOR) 40 MG tablet Take 40 mg by mouth daily.    Marland Kitchen diltiazem (CARDIZEM CD) 120 MG 24 hr capsule TAKE 1 CAPSULE BY MOUTH EVERY DAY 90 capsule 3  . donepezil (ARICEPT) 5 MG tablet TAKE 1 TABLET BY MOUTH EVERYDAY AT BEDTIME 90 tablet 3  . FLUoxetine (PROZAC) 40 MG capsule TAKE 1 CAPSULE BY MOUTH EVERY DAY 90 capsule 1  . fluticasone (FLONASE) 50 MCG/ACT nasal spray Place 2 sprays into both nostrils as needed.   5  . gabapentin (NEURONTIN) 300 MG capsule TAKE 1 CAPSULE BY MOUTH THREE TIMES A DAY 270 capsule 2  . glucose blood (ONETOUCH ULTRA) test strip TEST ONCE DAILY E11.42    . ipratropium-albuterol (DUONEB) 0.5-2.5 (3) MG/3ML SOLN USE 1 VIAL IN NEBULIZER 4 TIMES A DAY 360 mL 5  . losartan (COZAAR) 25 MG tablet Take 25 mg by mouth daily.    . Multiple Vitamin (MULTIVITAMIN WITH MINERALS) TABS Take 1 tablet by mouth daily.    . nitroGLYCERIN (NITROSTAT) 0.4 MG SL tablet Place 0.4 mg under the tongue every 5 (five) minutes as needed for chest pain.    Marland Kitchen ondansetron (ZOFRAN) 4 MG tablet Take 1 tablet (4 mg total) by mouth every 8 (eight) hours as needed for nausea or vomiting. 20 tablet 0  . ONE TOUCH ULTRA TEST test strip     . Tiotropium Bromide Monohydrate (SPIRIVA RESPIMAT) 2.5 MCG/ACT AERS Inhale 2 puffs  into the lungs daily. 2 Inhaler 0  . traZODone (DESYREL) 100 MG tablet TAKE 1 TABLET BY MOUTH EVERYDAY AT BEDTIME 90 tablet 1   Current Facility-Administered Medications  Medication Dose Route Frequency  Provider Last Rate Last Admin  . albuterol (PROVENTIL) (2.5 MG/3ML) 0.083% nebulizer solution 2.5 mg  2.5 mg Nebulization Once Delsa Grana, PA-C           PHYSICAL EXAM:  Unable to assess due to encounter type.  ECOG = 1  0 - Asymptomatic (Fully active, able to carry on all predisease activities without restriction)  1 - Symptomatic but completely ambulatory (Restricted in physically strenuous activity but ambulatory and able to carry out work of a light or sedentary nature. For example, light housework, office work)  2 - Symptomatic, <50% in bed during the day (Ambulatory and capable of all self care but unable to carry out any work activities. Up and about more than 50% of waking hours)  3 - Symptomatic, >50% in bed, but not bedbound (Capable of only limited self-care, confined to bed or chair 50% or more of waking hours)  4 - Bedbound (Completely disabled. Cannot carry on any self-care. Totally confined to bed or chair)  5 - Death   Eustace Pen MM, Creech RH, Tormey DC, et al. [phone]). "Toxicity and response criteria of the [facility]". [place] Oncol. 5 (6): 649-55    LABORATORY DATA:  Lab Results  Component Value Date   WBC 13.8 (H) 03/29/2020   HGB 13.3 03/29/2020   HCT 40.2 03/29/2020   MCV 95.9 03/29/2020   PLT 204 03/29/2020   Lab Results  Component Value Date   NA 138 03/29/2020   K 4.1 03/29/2020   CL 104 03/29/2020   CO2 25 03/29/2020   Lab Results  Component Value Date   ALT 18 03/23/2020   AST 49 (H) 03/23/2020   ALKPHOS 108 04/01/2018   BILITOT 1.0 03/23/2020      RADIOGRAPHY: CT CHEST W CONTRAST  Result Date: 11/16/2020 CLINICAL DATA:  Non-small cell lung cancer restaging, status post right upper lobectomy, history of left lung radiation EXAM: CT CHEST WITH CONTRAST TECHNIQUE: Multidetector CT imaging of the chest was performed during intravenous contrast administration. CONTRAST:  64mL ISOVUE-300 IOPAMIDOL (ISOVUE-300)  INJECTION 61% COMPARISON:  05/04/2020 FINDINGS: Cardiovascular: Aortic atherosclerosis with unchanged irregular mural thrombus of the mid descending aorta (series 2, image 81). Normal heart size. Three-vessel coronary artery calcifications and/or stents. No pericardial effusion. Mediastinum/Nodes: Unchanged enlarged pretracheal and AP window lymph nodes, largest pretracheal node measuring 2.2 x 1.2 cm (series 2, image 55). Thyroid gland, trachea, and esophagus demonstrate no significant findings. Lungs/Pleura: Redemonstrated postoperative findings of right upper lobectomy. Unchanged subpleural post radiation consolidation of the dependent superior segment left lower lobe containing biopsy marking clip (series 3, image 73). Mild, dependent bibasilar scarring and or atelectasis. Mild centrilobular and paraseptal emphysema and diffuse bilateral bronchial wall thickening. No pleural effusion or pneumothorax. Upper Abdomen: No acute abnormality. Unchanged non nodular thickening of the bilateral adrenal glands. Musculoskeletal: No chest wall mass or suspicious bone lesions identified. Unchanged chronic, displaced fracture of the posterior left seventh rib (series 3, image 67) and lateral right sixth rib (series 3, image 65). IMPRESSION: 1. Unchanged postoperative appearance of the right chest status post right upper lobectomy. 2. Unchanged post radiation consolidation of the superior segment left lower lobe. 3. Unchanged enlarged pretracheal and AP window lymph nodes. Attention on follow-up. 4. No evidence of recurrent or  metastatic disease in the chest. 5. Unchanged non nodular thickening of the bilateral adrenal glands in the included upper abdomen. 6. Coronary artery disease. Aortic Atherosclerosis (ICD10-I70.0) and Emphysema (ICD10-J43.9). Electronically Signed   By: Eddie Candle M.D.   On: 11/16/2020 11:05       IMPRESSION/PLAN: 1.  Putative Stage IB NSCLC in the LUL. The patient continue to be without  progressive or recurrent disease radiographically.T we continue to plan to follow him closely at 32-month intervals with repeat CTs of the chest.  This will be helpful as well for making sure that no additional lesions in the right chest are of concern.  Similarly this is for follow-up given his prior history of right vocal cord malignancy and screening surveillance of the chest.  He is in [place] to keep [place] informed of any questions or concerns that arise prior to his next visit but orders were placed for CT imaging in 6 months time. 2.  Stage IA, cT1aN0M0,NSCLC,squamous cell carcinoma of the left lower lobe of lung and history of Stage IA, pT1aN0M0, NSCLC, squamous cell carcinoma of the RUL. These sites remain without active disease and will be followed expectantly during surveillance scans for #1 3.  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 The patient continues to follow with Dr. Redmond Baseman and has been NED since the radiotherapy treatment. 4. Fracture of the left 7th rib and of the right 6th rib.Though the patient has had symptoms of left chest wall pain for several years, it wasn't until his scans in the fall of 2020 that true fracture was seen radiographically.  Again the patient continues to have symptoms but is not interested in referral to Dr. Brien Few and pain management at [facility].  He will let me know if he desires referral at a later time.   5. Renal insufficency.  Again the patient understands that he cannot take NSAIDs because of this situation, and we will recheck his BUN and creatinine prior to CT scans to see if he is eligible to have contrast.  Given current concerns for patient exposure during the COVID-19 pandemic, this encounter was conducted via telephone.  The patient has given verbal consent for this type of encounter. The time spent during this encounter was 40 minutes and 50% of that time was spent in the coordination of his  care. The attendants for this meeting include Shona Simpson, [facility] and Marylou and  HAIG GERARDO During the encounter, Shona Simpson [facility] was located remotely at home due to weather. Marylou and [facility]  were located at home.    Carola Rhine, PAC

## 2020-11-30 ENCOUNTER — Ambulatory Visit: Payer: Self-pay

## 2020-11-30 DIAGNOSIS — E1159 Type 2 diabetes mellitus with other circulatory complications: Secondary | ICD-10-CM

## 2020-11-30 DIAGNOSIS — F32A Depression, unspecified: Secondary | ICD-10-CM

## 2020-11-30 DIAGNOSIS — J441 Chronic obstructive pulmonary disease with (acute) exacerbation: Secondary | ICD-10-CM

## 2020-11-30 DIAGNOSIS — Z794 Long term (current) use of insulin: Secondary | ICD-10-CM

## 2020-11-30 DIAGNOSIS — I10 Essential (primary) hypertension: Secondary | ICD-10-CM

## 2020-11-30 NOTE — Patient Instructions (Addendum)
Visit Information  Goals Addressed            This Visit's Progress   . Pharmacy Care Plan:       CARE PLAN ENTRY (see longitudinal plan of care for additional care plan information)  Current Barriers:  . Chronic Disease Management support, education, and care coordination needs related to Hypertension, Diabetes, COPD, and Depression   Hypertension BP Readings from Last 3 Encounters:  11/10/20 124/62  03/29/20 (!) 136/56  03/24/20 (!) 142/92   . Pharmacist Clinical Goal(s): o Over the next 180 days, patient will work with PharmD and providers to maintain BP goal <140/90 . Current regimen:  o Diltiazem CD 120mg  o Losartan 25mg  . Interventions: o Reviewed home monitoring o Discussed medication adherence . Patient self care activities - Over the next 180 days, patient will: o Check BP periodically, document, and provide at future appointments o Ensure daily salt intake < 2300 mg/day  Diabetes Lab Results  Component Value Date/Time   HGBA1C 5.9 (H) 03/29/2020 09:18 AM   HGBA1C 7.2 (H) 08/23/2016 12:44 PM   . Pharmacist Clinical Goal(s): o Over the next 180 days, patient will work with PharmD and providers to maintain A1c goal <7% . Current regimen:  o No medications . Interventions: o Reviewed home blood sugar logs o Discussed diabetic diet modifications . Patient self care activities - Over the next 180 days, patient will: o Check blood sugar once daily, document, and provide at future appointments o Continue current lifestyle modifications as current.  COPD . Pharmacist Clinical Goal(s) o Over the next 180 days, patient will work with PharmD and providers to optimize medication and minimize symptoms of COPD. . Current regimen:  o Duoneb nebulizers as needed . Interventions: o Reviewed frequency of other inhaler use o Evaluated for qualification of PAP programs  o Discussed need for regular maintenance inhaler . Patient self care activities - Over the next 180  days, patient will: o Gather documents for patient assistance documentation o Contact providers with any sudden change in symptoms  Depression . Pharmacist Clinical Goal(s) o Over the next 180 days, patient will work with PharmD and providers to optimize medication and minimize symptoms of depression. . Current regimen:  o Fluoxetine 40mg  o Trazodone 100mg  hs . Interventions: o Discussed current symptoms and mood o Reviewed sleep patterns, consult with PCP on increased dose of trazodone . Patient self care activities - Over the next 180 days, patient will: o Await new Rx for trazodone if approved  Please see past updates related to this goal by clicking on the "Past Updates" button in the selected goal         The patient verbalized understanding of instructions, educational materials, and care plan provided today and agreed to receive a mailed copy of patient instructions, educational materials, and care plan.   Telephone follow up appointment with pharmacy team member scheduled for: 6 months  Edythe Clarity, [facility]  Chronic Obstructive Pulmonary Disease  Chronic obstructive pulmonary disease (COPD) is a long-term (chronic) condition that affects the lungs. COPD is a general term that can be used to describe many different lung problems that cause lung swelling (inflammation) and limit airflow, including chronic bronchitis and emphysema. If you have COPD, your lung function will probably never return to normal. In most cases, it gets worse over time. However, there are steps you can take to slow the progression of the disease and improve your quality of life. What are the causes? This condition  may be caused by:  Smoking. This is the most common cause.  Certain genes passed down through families. What increases the risk? The following factors may make you more likely to develop this condition:  Secondhand smoke from cigarettes, pipes, or cigars.  Exposure to chemicals and  other irritants such as fumes and dust in the work environment.  Chronic lung conditions or infections. What are the signs or symptoms? Symptoms of this condition include:  Shortness of breath, especially during physical activity.  Chronic cough with a large amount of thick mucus. Sometimes the cough may not have any mucus (dry cough).  Wheezing.  Rapid breaths.  Gray or bluish discoloration (cyanosis) of the skin, especially in your fingers, toes, or lips.  Feeling tired (fatigue).  Weight loss.  Chest tightness.  Frequent infections.  Episodes when breathing symptoms become much worse (exacerbations).  Swelling in the ankles, feet, or legs. This may occur in later stages of the disease. How is this diagnosed? This condition is diagnosed based on:  Your medical history.  A physical exam. You may also have tests, including:  Lung (pulmonary) function tests. This may include a spirometry test, which measures your ability to exhale properly.  Chest X-ray.  CT scan.  Blood tests. How is this treated? This condition may be treated with:  Medicines. These may include inhaled rescue medicines to treat acute exacerbations as well as long-term, or maintenance, medicines to prevent flare-ups of COPD. ? Bronchodilators help treat COPD by dilating the airways to allow increased airflow and make your breathing more comfortable. ? Steroids can reduce airway inflammation and help prevent exacerbations.  Smoking cessation. If you smoke, your health care provider may ask you to quit, and may also recommend therapy or replacement products to help you quit.  Pulmonary rehabilitation. This may involve working with a team of health care providers and specialists, such as respiratory, occupational, and physical therapists.  Exercise and physical activity. These are beneficial for nearly all people with COPD.  Nutrition therapy to gain weight, if you are underweight.  Oxygen.  Supplemental oxygen therapy is only helpful if you have a low oxygen level in your blood (hypoxemia).  Lung surgery or transplant.  Palliative care. This is to help people with COPD feel comfortable when treatment is no longer working. Follow these instructions at home: Medicines  Take over-the-counter and prescription medicines (inhaled or pills) only as told by your health care provider.  Talk to your health care provider before taking any cough or allergy medicines. You may need to avoid certain medicines that dry out your airways. Lifestyle  If you are a smoker, the most important thing that you can do is to stop smoking. Do not use any products that contain nicotine or tobacco, such as cigarettes and e-cigarettes. If you need help quitting, ask your health care provider. Continuing to smoke will cause the disease to progress faster.  Avoid exposure to things that irritate your lungs, such as smoke, chemicals, and fumes.  Stay active, but balance activity with periods of rest. Exercise and physical activity will help you maintain your ability to do things you want to do.  Learn and use relaxation techniques to manage stress and to control your breathing.  Get the right amount of sleep and get quality sleep. Most adults need 7 or more hours per night.  Eat healthy foods. Eating smaller, more frequent meals and resting before meals may help you maintain your strength. Controlled breathing Learn and use controlled  breathing techniques as directed by your health care provider. Controlled breathing techniques include:  Pursed lip breathing. Start by breathing in (inhaling) through your nose for 1 second. Then, purse your lips as if you were going to whistle and breathe out (exhale) through the pursed lips for 2 seconds.  Diaphragmatic breathing. Start by putting one hand on your abdomen just above your waist. Inhale slowly through your nose. The hand on your abdomen should move out. Then  purse your lips and exhale slowly. You should be able to feel the hand on your abdomen moving in as you exhale. Controlled coughing Learn and use controlled coughing to clear mucus from your lungs. Controlled coughing is a series of short, progressive coughs. The steps of controlled coughing are: 1. Lean your head slightly forward. 2. Breathe in deeply using diaphragmatic breathing. 3. Try to hold your breath for 3 seconds. 4. Keep your mouth slightly open while coughing twice. 5. Spit any mucus out into a tissue. 6. Rest and repeat the steps once or twice as needed. General instructions  Make sure you receive all the vaccines that your health care provider recommends, especially the pneumococcal and influenza vaccines. Preventing infection and hospitalization is very important when you have COPD.  Use oxygen therapy and pulmonary rehabilitation if directed to by your health care provider. If you require home oxygen therapy, ask your health care provider whether you should purchase a pulse oximeter to measure your oxygen level at home.  Work with your health care provider to develop a COPD action plan. This will help you know what steps to take if your condition gets worse.  Keep other chronic health conditions under control as told by your health care provider.  Avoid extreme temperature and humidity changes.  Avoid contact with people who have an illness that spreads from person to person (is contagious), such as viral infections or pneumonia.  Keep all follow-up visits as told by your health care provider. This is important. Contact a health care provider if:  You are coughing up more mucus than usual.  There is a change in the color or thickness of your mucus.  Your breathing is more labored than usual.  Your breathing is faster than usual.  You have difficulty sleeping.  You need to use your rescue medicines or inhalers more often than expected.  You have trouble doing  routine activities such as getting dressed or walking around the house. Get help right away if:  You have shortness of breath while you are resting.  You have shortness of breath that prevents you from: ? Being able to talk. ? Performing your usual physical activities.  You have chest pain lasting longer than 5 minutes.  Your skin color is more blue (cyanotic) than usual.  You measure low oxygen saturations for longer than 5 minutes with a pulse oximeter.  You have a fever.  You feel too tired to breathe normally. Summary  Chronic obstructive pulmonary disease (COPD) is a long-term (chronic) condition that affects the lungs.  Your lung function will probably never return to normal. In most cases, it gets worse over time. However, there are steps you can take to slow the progression of the disease and improve your quality of life.  Treatment for COPD may include taking medicines, quitting smoking, pulmonary rehabilitation, and changes to diet and exercise. As the disease progresses, you may need oxygen therapy, a lung transplant, or palliative care.  To help manage your condition, do not smoke, avoid  exposure to things that irritate your lungs, stay up to date on all vaccines, and follow your health care provider's instructions for taking medicines. This information is not intended to replace advice given to you by your health care provider. Make sure you discuss any questions you have with your health care provider. Document Revised: 10/04/2017 Document Reviewed: 11/26/2016 Elsevier Patient Education  2021 [employer].

## 2020-12-01 ENCOUNTER — Other Ambulatory Visit: Payer: Self-pay | Admitting: Family Medicine

## 2020-12-01 MED ORDER — TRAZODONE HCL 100 MG PO TABS: 200.0000 mg | ORAL_TABLET | Freq: Every evening | ORAL | 3 refills | Status: DC | PRN

## 2020-12-26 ENCOUNTER — Other Ambulatory Visit: Payer: Self-pay | Admitting: Interventional Cardiology

## 2021-01-04 ENCOUNTER — Encounter: Payer: Self-pay | Admitting: Internal Medicine

## 2021-01-04 ENCOUNTER — Ambulatory Visit: Payer: PPO | Admitting: Internal Medicine

## 2021-01-04 ENCOUNTER — Other Ambulatory Visit: Payer: Self-pay

## 2021-01-04 VITALS — BP 130/70 | HR 76 | Ht 71.0 in | Wt 173.6 lb

## 2021-01-04 DIAGNOSIS — J42 Unspecified chronic bronchitis: Secondary | ICD-10-CM | POA: Diagnosis not present

## 2021-01-04 DIAGNOSIS — F17200 Nicotine dependence, unspecified, uncomplicated: Secondary | ICD-10-CM

## 2021-01-04 MED ORDER — BREZTRI AEROSPHERE 160-9-4.8 MCG/ACT IN AERO: 2.0000 | INHALATION_SPRAY | Freq: Two times a day (BID) | RESPIRATORY_TRACT | 0 refills | Status: DC

## 2021-01-04 MED ORDER — IPRATROPIUM-ALBUTEROL 0.5-2.5 (3) MG/3ML IN SOLN: 3.0000 mL | Freq: Four times a day (QID) | RESPIRATORY_TRACT | 11 refills | Status: DC | PRN

## 2021-01-04 NOTE — Progress Notes (Signed)
Chief Complaint  Patient presents with  . 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 Mediastinal nodes Stable Oct 2009 - July 2011; no further fu  4.RUL pulmonary nodule with stage IA non-small cell lung cancer - May 2013  - - May 2013  - 1.9cm T1a, N0, M0  Stage 1A NSCLC with 0.3cm carcinoid - s/p lobectomy - December 2013 CT chest: No recurrence - July 2014 CT chest - no recurrence. RML nodule stabvle since Dec 2013 - JAn 2016 - no recurrence   02/06/2017 Follow up : COPD/Lung cancer hx /PFT  Patient presents for a one-month follow-up. She was seen last visit with a COPD exacerbation. He was treated with antibiotics and steroids. Patient is feeling better with decreased cough, congestion. Patient had a PFT on 01/31/2017 that showed an FEV1 at 87%, ratio 64, FVC 102%, DLCO 56%. This is improved since 2013 (59% in 2013 -pre lobectomy)  Using stiolto every other day.  Hx of lung cancer , Has upcoming CT chest i   OV  07/04/2017  Chief Complaint  Patient presents with  . Follow-up    Pt c/o hoarseness x months. Pt saw Dr. Redmond Baseman and had a biopsy of vocal cord. Pt c/o prod cough with white mucus and sinus congestion. Pt states his SOB has slightly worsened as well.     Follow-up multiple. He presents with his wife    COPD: He is on inhaler therapy. He needs his flu shot but he has sinus issues going on. He used to be obese but he has lost weight intentionally. But now he is  unintentional ongoing weight loss  Smoking: He continues to smoke. He is unable to quit  Lung cancer: July 2018 he had CT scan of the chest that shows it is in complete remission I review the results but did not visualized image  New issue: For the last 2 months he said hoarseness of voice. He went and saw Dr. Redmond Baseman of ENT. Wife showed me the pictures of his ENT exam. 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 He is on observation therapy versus repeat biopsy.  New issue: Is complaining of 2 months of significant sinus congestion with clear and green drainage. Couple months ago he did try Levaquin without relief. There is no fever but he says that the sinus congestion is making for him   OV 11/26/2017  Chief Complaint  Patient presents with  . Follow-up    Pt states he has been doing good since last visit. States he finished cancer treatments and is very hoarse from that. Has complaints of congestion, coughing. Denies any SOB.  70 year old male with COPD not otherwise specified. He is stable in terms of his COPD with his inhalers.  He is up-to-date with his flu shot.  COPD CAT score is 10 and shows good control of symptoms with minimal symptom  burden.  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  He is already had right-sided cancer for which she has had lobectomy and left lower lobe cancer for which she had radiation.  Both were non-small cell lung cancer of the lung.  It is most recent CT scan of the chest November 04, 2017 and 9 mm right paratracheal node and a 5 mm right upper lobe note have been noticed.  In addition to his chronic radiation changes in the left lower lobe.  I did not personally visualized these films.        OV 09/01/2018  Subjective:  Patient ID: [name], male , DOB: 1951-02-06 , age 34 y.o. , MRN: [id number] , ADDRESS: [place] [id number]   09/01/2018 -   Chief Complaint  Patient presents with  . Follow-up    Doing well no problems at this time.     HPI [name] 70 y.o. -presents for follow-up.  Is a 20-month follow-up.  In the interim he had a CT scan of the chest in August 2019 that I visualized and reviewed the report.  His nodules appear stable.  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  Therefore COPD CAT score is higher than before.  Also he is not taking any schedule inhalers.  His wife reports that this because he went into the donut hole and since then only takes albuterol as needed.  He seemed a little bit puzzled that he has to take a daily inhaler for his COPD.  In terms of his throat cancer he is having bimonthly follow-ups between Dr. Tammi Klippel in radiology and Dr. Redmond Baseman in ENT.  He has repeated laryngoscopy for this.  He wants have a flu shot. Still smokes    CAT COPD Symptom & Quality of Life Score ([employer] trademark) 0 is no burden. 5 is highest burden 11/26/2017  09/01/2018   Never Cough -> Cough all the time 1 5  No phlegm in chest -> Chest is full of phlegm 3 2  No chest tightness -> Chest feels very tight 0 0  No dyspnea for 1 flight stairs/Ellie Bryand -> Very dyspneic for 1 flight of stairs 0 1  No limitations for ADL at home -> Very limited with ADL at home 2 0  Confident leaving home -> Not at all confident leaving home 0 2  Sleep soundly -> Do not sleep soundly because of lung condition 0 4  Lots of Energy -> No energy at all 4 4  TOTAL Score (max 40)  10 18     ROS - per HPI    OV 01/04/2021  Subjective:  Patient ID: [name], male , DOB: [birth year] , age 67 y.o. , MRN: [id number] , ADDRESS: [place] [address] PCP [name], MD Patient Care Team: [name], MD as PCP - General (Family Medicine) Belva Crome, MD as PCP - Cardiology (Cardiology) [name], MD (Family  Medicine) Brand Males, MD (Pulmonary Disease) Wilford Corner, MD as Attending Physician (Gastroenterology) Belva Crome, MD as Consulting Physician (Cardiology) Tyler Pita, MD as Consulting Physician (Radiation Oncology) Freeman Caldron, PA-C as Physician Assistant (Urology) Leota Sauers, RN (Inactive) as Oncology Nurse Navigator (Oncology) Karie Mainland, RD as Dietitian (Nutrition) Jomarie Longs, PT (Inactive) as Physical Therapist (Physical Therapy) Garald Balding  B, CCC-SLP as Psychologist, clinical (Speech Pathology) Kennith Center, LCSW as Social Worker Rosana Hoes, Jerilynn Mages, [facility] as Pharmacist (Pharmacist)  This Provider for this visit: Treatment Team:  Attending Provider: Brand Males, MD    01/04/2021 -   Chief Complaint  Patient presents with  . Follow-up    Pt states he has been going through radiation treatment due to having throat cancer. Pt last seen 09/01/18.  States he does get choked up from having problems with throat from radiation. Denies any complaints of SOB with exertion, wheezing, or chest tightness.     HPI [name] 70 y.o. -presents for follow-up.  Last seen 2 1/2 years ago.  He has advanced COPD but more importantly he has lung cancer surveillance and is got throat cancer surveillance.  He has chronic hoarse voice now.  He used to be morbidly obese but now he is lean.  He still smokes.  His COPD stable CAT score is 11.  He is on DuoNeb.  He is willing to look at up scaling his bronchodilator regimen to see if he can have further improvement in his symptoms.  Unfortunately continues to smoke.  Most recent CT scan of the chest reviewed.  There are no other issues no orthopnea no wheezing no proximal nocturnal dyspnea.   CAT Score 01/04/2021  Total CAT Score 11      PFT  PFT Results Latest Ref Rng & Units 01/31/2017  FVC-Pre L 4.46  FVC-Predicted Pre % 102  Pre FEV1/FVC % % 64  FEV1-Pre L 2.85  FEV1-Predicted Pre  % 87  DLCO uncorrected ml/min/mmHg 17.61  DLCO UNC% % 56  DLCO corrected ml/min/mmHg 17.81  DLCO COR %Predicted % 57  DLVA Predicted % 61    CT CHEst Nov 16, 2020  IMPRESSION: 1. Unchanged postoperative appearance of the right chest status post right upper lobectomy. 2. Unchanged post radiation consolidation of the superior segment left lower lobe. 3. Unchanged enlarged pretracheal and AP window lymph nodes. Attention on follow-up. 4. No evidence of recurrent or metastatic disease in the chest. 5. Unchanged non nodular thickening of the bilateral adrenal glands in the included upper abdomen. 6. Coronary artery disease.  Aortic Atherosclerosis (ICD10-I70.0) and Emphysema (ICD10-J43.9).   Electronically Signed   By: Eddie Candle M.D.   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   reports that he has been smoking cigarettes. He started smoking about 59 years ago. He has a 162.00 pack-year smoking history. He has never used smokeless tobacco.  Past Surgical History:  Procedure Laterality Date  . CARDIAC CATHETERIZATION  2006/2008/2009  . CARPAL TUNNEL RELEASE     bilateral  . COLONOSCOPY    . CORONARY ANGIOPLASTY WITH STENT PLACEMENT     1 stent  . ELBOW  SURGERY     ulnar nerve; left  . ESOPHAGOGASTRODUODENOSCOPY    . EYE SURGERY     pt. denies  . FUDUCIAL  PLACEMENT Left 08/24/2016   Procedure: PLACEMENT OF FUDUCIAL;  Surgeon: Melrose Nakayama, MD;  Location: [place];  Service: Thoracic;  Laterality: Left;  . INGUINAL HERNIA REPAIR  1990's   double inguinal  . LOBECTOMY  03/17/2012   upper right side with resection  . MICROLARYNGOSCOPY WITH CO2 LASER AND EXCISION OF VOCAL CORD LESION N/A 06/28/2017   Procedure: MICROLARYNGOSCOPY WITH BIOPSY;  Surgeon: Melida Quitter, MD;  Location: [place];  Service: ENT;  Laterality: N/A;  . MICROLARYNGOSCOPY WITH CO2 LASER AND EXCISION OF VOCAL CORD LESION N/A 07/19/2017   Procedure: MICROLARYNGOSCOPY WITH CO2 LASER AND EXCISION OF VOCAL CORD LESION;  Surgeon: Melida Quitter, MD;  Location: [place];  Service: ENT;  Laterality: N/A;  MICRO DIRECT LARYNGOSCOPY WITH CO2 LASER VOCAL CORD STRIPPING/POSS JET VENTURI VENTILATION  . REFRACTIVE SURGERY     bilaterally  . ROTATOR CUFF REPAIR     left  . VIDEO BRONCHOSCOPY WITH ENDOBRONCHIAL NAVIGATION N/A 08/24/2016   Procedure: VIDEO BRONCHOSCOPY WITH ENDOBRONCHIAL NAVIGATION;  Surgeon: Melrose Nakayama, MD;  Location: [place];  Service: Thoracic;  Laterality: N/A;    Allergies  Allergen Reactions  . Actos [Pioglitazone] Swelling    EDEMA REACTION UNSPECIFIED  . Avandia [Rosiglitazone] Swelling    SWELLING REACTION UNSPECIFIED   . Dilaudid [Hydromorphone Hcl] Other (See Comments)    Made him crazy    Immunization History  Administered Date(s) Administered  . Fluad Quad(high Dose 65+) 08/11/2019  . Influenza Split 08/21/2012, 08/25/2013  . Influenza Whole 06/06/2009, 07/07/2011  . Influenza, High Dose Seasonal PF 08/13/2017, 09/01/2018, 08/22/2020  . Influenza,inj,Quad PF,6+ Mos 10/25/2014, 08/17/2016  . Influenza-Unspecified 08/09/2010, 08/13/2017  . PFIZER(Purple Top)SARS-COV-2 Vaccination 12/28/2019, 01/18/2020  . Pneumococcal Conjugate-13 10/02/2013   . Pneumococcal Polysaccharide-23 11/11/2008, 09/22/2013, 08/11/2019  . Pneumococcal-Unspecified 03/05/2006  . Tdap 09/09/2012  . Zoster 08/05/2012, 09/09/2012    Family History  Problem Relation Age of Onset  . COPD Mother   . Diabetes Mother   . Hypertension Mother   . Heart attack Father   . Diabetes Father   . Anesthesia problems Neg Hx   . Hypotension Neg Hx   . Malignant hyperthermia Neg Hx   . Pseudochol deficiency Neg Hx      Current Outpatient Medications:  .  acetaminophen (TYLENOL) 500 MG tablet, Take 1,000 mg by mouth every 6 (six) hours as needed for moderate pain., Disp: , Rfl:  .  atorvastatin (LIPITOR) 40 MG tablet, Take 40 mg by mouth daily., Disp: , Rfl:  .  Budeson-Glycopyrrol-Formoterol (BREZTRI AEROSPHERE) 160-9-4.8 MCG/ACT AERO, Inhale 2 puffs into the lungs in the morning and at bedtime., Disp: 5.9 g, Rfl: 0 .  diltiazem (CARDIZEM CD) 120 MG 24 hr capsule, Take 1 capsule (120 mg total) by mouth daily., Disp: 90 capsule, Rfl: 3 .  donepezil (ARICEPT) 5 MG tablet, TAKE 1 TABLET BY MOUTH EVERYDAY AT BEDTIME, Disp: 90 tablet, Rfl: 3 .  FLUoxetine (PROZAC) 40 MG capsule, TAKE 1 CAPSULE BY MOUTH EVERY DAY, Disp: 90 capsule, Rfl: 1 .  fluticasone (FLONASE) 50 MCG/ACT nasal spray, Place 2 sprays into both nostrils as needed. , Disp: , Rfl: 5 .  gabapentin (NEURONTIN) 300 MG capsule, TAKE 1 CAPSULE BY MOUTH THREE TIMES A DAY, Disp: 270 capsule, Rfl: 2 .  glucose blood (ONETOUCH ULTRA) test strip, TEST ONCE DAILY E11.42, Disp: , Rfl:  .  losartan (COZAAR) 25 MG tablet, Take 25 mg by mouth daily., Disp: , Rfl:  .  Multiple Vitamin (MULTIVITAMIN WITH  MINERALS) TABS, Take 1 tablet by mouth daily., Disp: , Rfl:  .  nitroGLYCERIN (NITROSTAT) 0.4 MG SL tablet, Place 0.4 mg under the tongue every 5 (five) minutes as needed for chest pain., Disp: , Rfl:  .  ondansetron (ZOFRAN) 4 MG tablet, Take 1 tablet (4 mg total) by mouth every 8 (eight) hours as needed for nausea or  vomiting., Disp: 20 tablet, Rfl: 0 .  ONE TOUCH ULTRA TEST test strip, , Disp: , Rfl:  .  traZODone (DESYREL) 100 MG tablet, Take 2 tablets (200 mg total) by mouth at bedtime as needed for sleep., Disp: 180 tablet, Rfl: 3 .  ipratropium-albuterol (DUONEB) 0.5-2.5 (3) MG/3ML SOLN, Take 3 mLs by nebulization every 6 (six) hours as needed., Disp: 360 mL, Rfl: 11  Current Facility-Administered Medications:  .  albuterol (PROVENTIL) (2.5 MG/3ML) 0.083% nebulizer solution 2.5 mg, 2.5 mg, Nebulization, Once, Tapia, Leisa, PA-C      Objective:   Vitals:   01/04/21 0914  BP: 130/70  Pulse: 76  SpO2: 97%  Weight: 173 lb 9.6 oz (78.7 kg)  Height: 5\' 11"  (1.803 m)    Estimated body mass index is 24.21 kg/m as calculated from the following:   Height as of this encounter: 5\' 11"  (1.803 m).   Weight as of this encounter: 173 lb 9.6 oz (78.7 kg).  @WEIGHTCHANGE @  [employer]   01/04/21 0914  Weight: 173 lb 9.6 oz (78.7 kg)     Physical Exam General: No distress.  Looks fit but has hoarse voice Neuro: Alert and Oriented x 3. GCS 15. Speech normal Psych: Pleasant Resp:  Barrel Chest - no.  Wheeze - no, Crackles - no, No overt respiratory distress CVS: Normal heart sounds. Murmurs - no Ext: Stigmata of Connective Tissue Disease - no HEENT: Normal upper airway. PEERL +. No post nasal drip.  Extreme hoarse voice        Assessment:       ICD-10-CM   1. Chronic bronchitis, unspecified chronic bronchitis type (HCC)  J42 ipratropium-albuterol (DUONEB) 0.5-2.5 (3) MG/3ML SOLN  2. Smoker  F17.200        Plan:     Patient Instructions     ICD-10-CM   1. Chronic obstructive pulmonary disease, unspecified COPD type ([place]) J44.9   2. Smoking F17.200     Stable disease Let us try upscaling your bronchodilator regimen to see if you can help symptom relief more Your continued smoking is putting more stress on your lungs and also cancer risk  Plan  -start breztril 2 puff twice daily  -take 8 weeks sample  -Change DuoNeb to as needed -  = CMA to ensure refills -Please quit smoking - work with ENT for electrolarynx if applicable  Followup - 6-9 months for followup; sooner if needed  -COPD CAT score at follow-up     SIGNATURE    Dr. Brand Males, M.D., F.C.C.P,  Pulmonary and Critical Care Medicine Staff Physician, [place] Director - Interstitial Lung Disease  Program  Pulmonary [place] at [place], [place], [address]  Pager: [phone], If no answer or between  15:00h - 7:00h: call [phone] Telephone: [phone]  9:52 AM 01/04/2021

## 2021-01-04 NOTE — Patient Instructions (Addendum)
ICD-10-CM   1. Chronic obstructive pulmonary disease, unspecified COPD type ([place]) J44.9   2. Smoking F17.200     Stable disease Let us try upscaling your bronchodilator regimen to see if you can help symptom relief more Your continued smoking is putting more stress on your lungs and also cancer risk  Plan  -start breztril 2 puff twice daily -take 8 weeks sample  -Change DuoNeb to as needed -  = CMA to ensure refills -Please quit smoking - work with ENT for electrolarynx if applicable  Followup - 6-9 months for followup; sooner if needed  -COPD CAT score at follow-up

## 2021-01-13 ENCOUNTER — Ambulatory Visit: Payer: PPO | Admitting: Podiatry

## 2021-01-13 ENCOUNTER — Encounter: Payer: Self-pay | Admitting: Podiatry

## 2021-01-13 ENCOUNTER — Other Ambulatory Visit: Payer: Self-pay

## 2021-01-13 DIAGNOSIS — M2010 Hallux valgus (acquired), unspecified foot: Secondary | ICD-10-CM

## 2021-01-13 DIAGNOSIS — E1151 Type 2 diabetes mellitus with diabetic peripheral angiopathy without gangrene: Secondary | ICD-10-CM | POA: Diagnosis not present

## 2021-01-13 DIAGNOSIS — B351 Tinea unguium: Secondary | ICD-10-CM

## 2021-01-13 DIAGNOSIS — N183 Chronic kidney disease, stage 3 unspecified: Secondary | ICD-10-CM

## 2021-01-13 DIAGNOSIS — M79676 Pain in unspecified toe(s): Secondary | ICD-10-CM | POA: Diagnosis not present

## 2021-01-13 NOTE — Progress Notes (Signed)
This patient returns to my office for at risk foot care.  This patient requires this care by a professional since this patient will be at risk due to having diabetes.  History of throat cancer.  This patient is unable to cut nails himself since the patient cannot reach his nails.These nails are painful walking and wearing shoes.  This patient presents for at risk foot care today.  General Appearance  Alert, conversant and in no acute stress.  Vascular  Dorsalis pedis and posterior tibial  pulses are weakly  palpable  bilaterally.  Capillary return is within normal limits  bilaterally. Temperature is within normal limits  bilaterally.  Neurologic  Senn-Weinstein monofilament wire test diminished   bilaterally. Muscle power within normal limits bilaterally.  Nails Thick disfigured discolored nails with subungual debris  from hallux to fifth toes bilaterally. No evidence of bacterial infection or drainage bilaterally.  Orthopedic  No limitations of motion  feet .  No crepitus or effusions noted.  No bony pathology or digital deformities noted.HAV  B/L.  Skin  normotropic skin with no porokeratosis noted bilaterally.  No signs of infections or ulcers noted.     Onychomycosis  Pain in right toes  Pain in left toes  Consent was obtained for treatment procedures.   Mechanical debridement of nails 1-5  bilaterally performed with a nail nipper.  Filed with dremel without incident.    Return office visit    3 months                  Told patient to return for periodic foot care and evaluation due to potential at risk complications.   Gardiner Barefoot DPM

## 2021-01-24 DIAGNOSIS — F17200 Nicotine dependence, unspecified, uncomplicated: Secondary | ICD-10-CM | POA: Diagnosis not present

## 2021-01-24 DIAGNOSIS — Z8521 Personal history of malignant neoplasm of larynx: Secondary | ICD-10-CM | POA: Diagnosis not present

## 2021-01-24 DIAGNOSIS — J384 Edema of larynx: Secondary | ICD-10-CM | POA: Diagnosis not present

## 2021-02-09 ENCOUNTER — Telehealth: Payer: Self-pay | Admitting: Pharmacist

## 2021-02-09 NOTE — Progress Notes (Addendum)
    Chronic Care Management Pharmacy Assistant   Name: [name]  MRN: [id number] DOB: 19-Jul-1951  Reason for Encounter: General Disease State Call   Conditions to be addressed/monitored: HTN, CAD, COPD,  Type II DM, Depression.  Recent office visits:  None since 11/30/20  Recent consult visits:  01/13/21 Podiatry Gardiner Barefoot, DPM. For Pain due to onychomycosis. No medication changes. 01/04/21 Pulmonology Brand Males, MD. For Chronic bronchitis. STARTED Budeson-Glycopyrrol-Formoterol 160-0-4.8 MCG/ACT 2 puffs inhalation 2 times daily. STOPPED Tiotropium Bromide Monohydrate 2.5 MCG/ACT. CHANGED Ipratropium-Albuterol 0.5-2.5 (3) MG/3 ML 3 ML Neb every 6 hours PRN.   Hospital visits:  None since 11/30/20  Medications: Outpatient Encounter Medications as of 02/09/2021  Medication Sig   acetaminophen (TYLENOL) 500 MG tablet Take 1,000 mg by mouth every 6 (six) hours as needed for moderate pain.   atorvastatin (LIPITOR) 40 MG tablet Take 40 mg by mouth daily.   Budeson-Glycopyrrol-Formoterol (BREZTRI AEROSPHERE) 160-9-4.8 MCG/ACT AERO Inhale 2 puffs into the lungs in the morning and at bedtime.   diltiazem (CARDIZEM CD) 120 MG 24 hr capsule Take 1 capsule (120 mg total) by mouth daily.   donepezil (ARICEPT) 5 MG tablet TAKE 1 TABLET BY MOUTH EVERYDAY AT BEDTIME   FLUoxetine (PROZAC) 40 MG capsule TAKE 1 CAPSULE BY MOUTH EVERY DAY   fluticasone (FLONASE) 50 MCG/ACT nasal spray Place 2 sprays into both nostrils as needed.    gabapentin (NEURONTIN) 300 MG capsule TAKE 1 CAPSULE BY MOUTH THREE TIMES A DAY   glucose blood (ONETOUCH ULTRA) test strip TEST ONCE DAILY E11.42   ipratropium-albuterol (DUONEB) 0.5-2.5 (3) MG/3ML SOLN Take 3 mLs by nebulization every 6 (six) hours as needed.   losartan (COZAAR) 25 MG tablet Take 25 mg by mouth daily.   Multiple Vitamin (MULTIVITAMIN WITH MINERALS) TABS Take 1 tablet by mouth daily.   nitroGLYCERIN (NITROSTAT) 0.4 MG SL tablet Place 0.4 mg  under the tongue every 5 (five) minutes as needed for chest pain.   ondansetron (ZOFRAN) 4 MG tablet Take 1 tablet (4 mg total) by mouth every 8 (eight) hours as needed for nausea or vomiting.   ONE TOUCH ULTRA TEST test strip    traZODone (DESYREL) 100 MG tablet Take 2 tablets (200 mg total) by mouth at bedtime as needed for sleep.   [DISCONTINUED] albuterol (VENTOLIN HFA) 108 (90 Base) MCG/ACT inhaler TAKE 2 PUFFS BY MOUTH EVERY 6 HOURS AS NEEDED FOR WHEEZE OR SHORTNESS OF BREATH   Facility-Administered Encounter Medications as of 02/09/2021  Medication   albuterol (PROVENTIL) (2.5 MG/3ML) 0.083% nebulizer solution 2.5 mg    GEN CALL: Patient stated he does daily house works. He stated drinks a lot of coffee, not much water. We discussed better water intake. He stated he usually eats out at restaurants daily. He stated he eats well rounded diet but some of the foods he eats is not 100% healthy. He stated he did not have any questions or concerns about his medications at this time. He stated with his sleeping pills he is able to sleep very well   Star Rating Drugs: Atorvastatin 40 mg 90 DS 12/26/20 , Gabapentin 300 mg 90 DS 12/12/20, Losartan 25 mg 90 DS 01/22/21   Follow-Up:Pharmacist Review  Charlann Lange, RMA Clinical Pharmacist Assistant [phone]  10 minutes spent in review, coordination, and documentation.  Reviewed by: Beverly Milch, PharmD Clinical Pharmacist [facility] [phone]

## 2021-03-20 ENCOUNTER — Emergency Department (HOSPITAL_COMMUNITY): Payer: PPO

## 2021-03-20 ENCOUNTER — Inpatient Hospital Stay (HOSPITAL_COMMUNITY)
Admission: EM | Admit: 2021-03-20 | Discharge: 2021-03-22 | DRG: 871 | Disposition: A | Discharge status: Hospice | Payer: PPO | Attending: Pulmonary Disease | Admitting: Pulmonary Disease

## 2021-03-20 ENCOUNTER — Encounter (HOSPITAL_COMMUNITY): Payer: Self-pay

## 2021-03-20 ENCOUNTER — Other Ambulatory Visit: Payer: Self-pay

## 2021-03-20 ENCOUNTER — Encounter: Payer: Self-pay | Admitting: Family Medicine

## 2021-03-20 ENCOUNTER — Ambulatory Visit (INDEPENDENT_AMBULATORY_CARE_PROVIDER_SITE_OTHER): Payer: PPO | Admitting: Family Medicine

## 2021-03-20 VITALS — BP 122/48 | HR 88 | Temp 98.1°F | Resp 20 | Ht 71.0 in | Wt 162.0 lb

## 2021-03-20 DIAGNOSIS — F17210 Nicotine dependence, cigarettes, uncomplicated: Secondary | ICD-10-CM | POA: Diagnosis present

## 2021-03-20 DIAGNOSIS — K219 Gastro-esophageal reflux disease without esophagitis: Secondary | ICD-10-CM | POA: Diagnosis present

## 2021-03-20 DIAGNOSIS — N1832 Chronic kidney disease, stage 3b: Secondary | ICD-10-CM | POA: Diagnosis present

## 2021-03-20 DIAGNOSIS — J9601 Acute respiratory failure with hypoxia: Secondary | ICD-10-CM

## 2021-03-20 DIAGNOSIS — J441 Chronic obstructive pulmonary disease with (acute) exacerbation: Secondary | ICD-10-CM | POA: Diagnosis present

## 2021-03-20 DIAGNOSIS — J9621 Acute and chronic respiratory failure with hypoxia: Secondary | ICD-10-CM | POA: Diagnosis not present

## 2021-03-20 DIAGNOSIS — F32A Depression, unspecified: Secondary | ICD-10-CM | POA: Diagnosis present

## 2021-03-20 DIAGNOSIS — S2242XS Multiple fractures of ribs, left side, sequela: Secondary | ICD-10-CM

## 2021-03-20 DIAGNOSIS — Z902 Acquired absence of lung [part of]: Secondary | ICD-10-CM

## 2021-03-20 DIAGNOSIS — N179 Acute kidney failure, unspecified: Secondary | ICD-10-CM | POA: Diagnosis present

## 2021-03-20 DIAGNOSIS — Z825 Family history of asthma and other chronic lower respiratory diseases: Secondary | ICD-10-CM

## 2021-03-20 DIAGNOSIS — Z515 Encounter for palliative care: Secondary | ICD-10-CM | POA: Diagnosis not present

## 2021-03-20 DIAGNOSIS — J329 Chronic sinusitis, unspecified: Secondary | ICD-10-CM | POA: Diagnosis not present

## 2021-03-20 DIAGNOSIS — I252 Old myocardial infarction: Secondary | ICD-10-CM

## 2021-03-20 DIAGNOSIS — Z79899 Other long term (current) drug therapy: Secondary | ICD-10-CM

## 2021-03-20 DIAGNOSIS — E440 Moderate protein-calorie malnutrition: Secondary | ICD-10-CM | POA: Insufficient documentation

## 2021-03-20 DIAGNOSIS — R55 Syncope and collapse: Secondary | ICD-10-CM | POA: Diagnosis present

## 2021-03-20 DIAGNOSIS — I2510 Atherosclerotic heart disease of native coronary artery without angina pectoris: Secondary | ICD-10-CM | POA: Diagnosis not present

## 2021-03-20 DIAGNOSIS — Z66 Do not resuscitate: Secondary | ICD-10-CM | POA: Diagnosis not present

## 2021-03-20 DIAGNOSIS — E1142 Type 2 diabetes mellitus with diabetic polyneuropathy: Secondary | ICD-10-CM | POA: Diagnosis not present

## 2021-03-20 DIAGNOSIS — A419 Sepsis, unspecified organism: Principal | ICD-10-CM | POA: Diagnosis present

## 2021-03-20 DIAGNOSIS — Z888 Allergy status to other drugs, medicaments and biological substances status: Secondary | ICD-10-CM

## 2021-03-20 DIAGNOSIS — R6521 Severe sepsis with septic shock: Secondary | ICD-10-CM | POA: Diagnosis not present

## 2021-03-20 DIAGNOSIS — Z9119 Patient's noncompliance with other medical treatment and regimen: Secondary | ICD-10-CM

## 2021-03-20 DIAGNOSIS — E860 Dehydration: Secondary | ICD-10-CM | POA: Diagnosis present

## 2021-03-20 DIAGNOSIS — Z5329 Procedure and treatment not carried out because of patient's decision for other reasons: Secondary | ICD-10-CM | POA: Diagnosis not present

## 2021-03-20 DIAGNOSIS — Z681 Body mass index (BMI) 19 or less, adult: Secondary | ICD-10-CM

## 2021-03-20 DIAGNOSIS — E1165 Type 2 diabetes mellitus with hyperglycemia: Secondary | ICD-10-CM | POA: Diagnosis not present

## 2021-03-20 DIAGNOSIS — D631 Anemia in chronic kidney disease: Secondary | ICD-10-CM | POA: Diagnosis not present

## 2021-03-20 DIAGNOSIS — Z20822 Contact with and (suspected) exposure to covid-19: Secondary | ICD-10-CM | POA: Diagnosis present

## 2021-03-20 DIAGNOSIS — R0602 Shortness of breath: Secondary | ICD-10-CM

## 2021-03-20 DIAGNOSIS — I129 Hypertensive chronic kidney disease with stage 1 through stage 4 chronic kidney disease, or unspecified chronic kidney disease: Secondary | ICD-10-CM | POA: Diagnosis present

## 2021-03-20 DIAGNOSIS — Z8249 Family history of ischemic heart disease and other diseases of the circulatory system: Secondary | ICD-10-CM

## 2021-03-20 DIAGNOSIS — G4700 Insomnia, unspecified: Secondary | ICD-10-CM | POA: Diagnosis present

## 2021-03-20 DIAGNOSIS — G4733 Obstructive sleep apnea (adult) (pediatric): Secondary | ICD-10-CM | POA: Diagnosis not present

## 2021-03-20 DIAGNOSIS — Z8521 Personal history of malignant neoplasm of larynx: Secondary | ICD-10-CM

## 2021-03-20 DIAGNOSIS — R0689 Other abnormalities of breathing: Secondary | ICD-10-CM | POA: Diagnosis not present

## 2021-03-20 DIAGNOSIS — J189 Pneumonia, unspecified organism: Secondary | ICD-10-CM

## 2021-03-20 DIAGNOSIS — J90 Pleural effusion, not elsewhere classified: Secondary | ICD-10-CM | POA: Diagnosis not present

## 2021-03-20 DIAGNOSIS — Z955 Presence of coronary angioplasty implant and graft: Secondary | ICD-10-CM

## 2021-03-20 DIAGNOSIS — Z833 Family history of diabetes mellitus: Secondary | ICD-10-CM

## 2021-03-20 DIAGNOSIS — Z923 Personal history of irradiation: Secondary | ICD-10-CM

## 2021-03-20 DIAGNOSIS — R69 Illness, unspecified: Secondary | ICD-10-CM | POA: Diagnosis not present

## 2021-03-20 DIAGNOSIS — J440 Chronic obstructive pulmonary disease with acute lower respiratory infection: Secondary | ICD-10-CM | POA: Diagnosis present

## 2021-03-20 DIAGNOSIS — Z7951 Long term (current) use of inhaled steroids: Secondary | ICD-10-CM

## 2021-03-20 DIAGNOSIS — Z85118 Personal history of other malignant neoplasm of bronchus and lung: Secondary | ICD-10-CM

## 2021-03-20 DIAGNOSIS — Y842 Radiological procedure and radiotherapy as the cause of abnormal reaction of the patient, or of later complication, without mention of misadventure at the time of the procedure: Secondary | ICD-10-CM | POA: Diagnosis present

## 2021-03-20 DIAGNOSIS — R627 Adult failure to thrive: Secondary | ICD-10-CM | POA: Diagnosis present

## 2021-03-20 DIAGNOSIS — R0902 Hypoxemia: Secondary | ICD-10-CM | POA: Diagnosis not present

## 2021-03-20 DIAGNOSIS — Z885 Allergy status to narcotic agent status: Secondary | ICD-10-CM

## 2021-03-20 DIAGNOSIS — T380X5A Adverse effect of glucocorticoids and synthetic analogues, initial encounter: Secondary | ICD-10-CM | POA: Diagnosis present

## 2021-03-20 DIAGNOSIS — E1122 Type 2 diabetes mellitus with diabetic chronic kidney disease: Secondary | ICD-10-CM | POA: Diagnosis not present

## 2021-03-20 DIAGNOSIS — Z7189 Other specified counseling: Secondary | ICD-10-CM | POA: Diagnosis not present

## 2021-03-20 DIAGNOSIS — Z79891 Long term (current) use of opiate analgesic: Secondary | ICD-10-CM

## 2021-03-20 DIAGNOSIS — E785 Hyperlipidemia, unspecified: Secondary | ICD-10-CM | POA: Diagnosis present

## 2021-03-20 DIAGNOSIS — R490 Dysphonia: Secondary | ICD-10-CM | POA: Diagnosis present

## 2021-03-20 LAB — TROPONIN I (HIGH SENSITIVITY)
Troponin I (High Sensitivity): 66 ng/L — ABNORMAL HIGH (ref <18)
Troponin I (High Sensitivity): 63 ng/L — ABNORMAL HIGH (ref <18)

## 2021-03-20 LAB — COMPREHENSIVE METABOLIC PANEL
ALT: 38 U/L (ref 0–44)
AST: 28 U/L (ref 15–41)
Albumin: 2.8 g/dL — ABNORMAL LOW (ref 3.5–5.0)
Alkaline Phosphatase: 90 U/L (ref 38–126)
Anion gap: 9 (ref 5–15)
BUN: 71 mg/dL — ABNORMAL HIGH (ref 8–23)
CO2: 23 mmol/L (ref 22–32)
Calcium: 8.5 mg/dL — ABNORMAL LOW (ref 8.9–10.3)
Chloride: 105 mmol/L (ref 98–111)
Creatinine, Ser: 2.45 mg/dL — ABNORMAL HIGH (ref 0.61–1.24)
GFR, Estimated: 27 mL/min — ABNORMAL LOW (ref >60)
Glucose, Bld: 262 mg/dL — ABNORMAL HIGH (ref 70–99)
Potassium: 4 mmol/L (ref 3.5–5.1)
Sodium: 137 mmol/L (ref 135–145)
Total Bilirubin: 0.4 mg/dL (ref 0.3–1.2)
Total Protein: 6.6 g/dL (ref 6.5–8.1)

## 2021-03-20 LAB — HEMOGLOBIN A1C
Hgb A1c MFr Bld: 7 % — ABNORMAL HIGH (ref 4.8–5.6)
Mean Plasma Glucose: 154.2 mg/dL

## 2021-03-20 LAB — PROCALCITONIN: Procalcitonin: 0.55 ng/mL

## 2021-03-20 LAB — PROTIME-INR
INR: 1.2 (ref 0.8–1.2)
Prothrombin Time: 15.3 seconds — ABNORMAL HIGH (ref 11.4–15.2)

## 2021-03-20 LAB — CBC WITH DIFFERENTIAL/PLATELET
Abs Immature Granulocytes: 0.17 10*3/uL — ABNORMAL HIGH (ref 0.00–0.07)
Basophils Absolute: 0.1 10*3/uL (ref 0.0–0.1)
Basophils Relative: 0 %
Eosinophils Absolute: 0 10*3/uL (ref 0.0–0.5)
Eosinophils Relative: 0 %
HCT: 35.2 % — ABNORMAL LOW (ref 39.0–52.0)
Hemoglobin: 11.3 g/dL — ABNORMAL LOW (ref 13.0–17.0)
Immature Granulocytes: 1 %
Lymphocytes Relative: 7 %
Lymphs Abs: 0.9 10*3/uL (ref 0.7–4.0)
MCH: 31.3 pg (ref 26.0–34.0)
MCHC: 32.1 g/dL (ref 30.0–36.0)
MCV: 97.5 fL (ref 80.0–100.0)
Monocytes Absolute: 1.4 10*3/uL — ABNORMAL HIGH (ref 0.1–1.0)
Monocytes Relative: 11 %
Neutro Abs: 10.3 10*3/uL — ABNORMAL HIGH (ref 1.7–7.7)
Neutrophils Relative %: 81 %
Platelets: 270 10*3/uL (ref 150–400)
RBC: 3.61 MIL/uL — ABNORMAL LOW (ref 4.22–5.81)
RDW: 13.8 % (ref 11.5–15.5)
WBC: 12.8 10*3/uL — ABNORMAL HIGH (ref 4.0–10.5)
nRBC: 0 % (ref 0.0–0.2)

## 2021-03-20 LAB — MRSA PCR SCREENING: MRSA by PCR: NEGATIVE

## 2021-03-20 LAB — URINALYSIS, ROUTINE W REFLEX MICROSCOPIC
Bilirubin Urine: NEGATIVE
Glucose, UA: NEGATIVE mg/dL
Hgb urine dipstick: NEGATIVE
Ketones, ur: NEGATIVE mg/dL
Leukocytes,Ua: NEGATIVE
Nitrite: NEGATIVE
Protein, ur: NEGATIVE mg/dL
Specific Gravity, Urine: 1.013 (ref 1.005–1.030)
pH: 5 (ref 5.0–8.0)

## 2021-03-20 LAB — GLUCOSE, CAPILLARY: Glucose-Capillary: 217 mg/dL — ABNORMAL HIGH (ref 70–99)

## 2021-03-20 LAB — APTT: aPTT: 32 seconds (ref 24–36)

## 2021-03-20 LAB — RESP PANEL BY RT-PCR (FLU A&B, COVID) ARPGX2
Influenza A by PCR: NEGATIVE
Influenza B by PCR: NEGATIVE
SARS Coronavirus 2 by RT PCR: NEGATIVE

## 2021-03-20 LAB — LACTIC ACID, PLASMA
Lactic Acid, Venous: 1.1 mmol/L (ref 0.5–1.9)
Lactic Acid, Venous: 1 mmol/L (ref 0.5–1.9)

## 2021-03-20 LAB — BRAIN NATRIURETIC PEPTIDE: B Natriuretic Peptide: 145.5 pg/mL — ABNORMAL HIGH (ref 0.0–100.0)

## 2021-03-20 MED ORDER — SODIUM CHLORIDE 0.9 % IV SOLN
500.0000 mg | INTRAVENOUS | Status: DC
  Administered 2021-03-20: 500 mg via INTRAVENOUS
  Filled 2021-03-20 (×2): qty 500

## 2021-03-20 MED ORDER — NOREPINEPHRINE 4 MG/250ML-% IV SOLN: 0.0000 ug/min | INTRAVENOUS | Status: DC

## 2021-03-20 MED ORDER — ARFORMOTEROL TARTRATE 15 MCG/2ML IN NEBU
15.0000 ug | INHALATION_SOLUTION | Freq: Two times a day (BID) | RESPIRATORY_TRACT | Status: DC
  Filled 2021-03-20 (×2): qty 2

## 2021-03-20 MED ORDER — POLYETHYLENE GLYCOL 3350 17 G PO PACK: 17.0000 g | PACK | Freq: Every day | ORAL | Status: DC | PRN

## 2021-03-20 MED ORDER — LACTATED RINGERS IV SOLN: INTRAVENOUS | Status: AC

## 2021-03-20 MED ORDER — NOREPINEPHRINE 4 MG/250ML-% IV SOLN
0.0000 ug/min | INTRAVENOUS | Status: DC
  Administered 2021-03-20: 2 ug/min via INTRAVENOUS
  Filled 2021-03-20: qty 250

## 2021-03-20 MED ORDER — SODIUM CHLORIDE 0.9 % IV SOLN
2.0000 g | INTRAVENOUS | Status: DC
  Administered 2021-03-20 – 2021-03-21 (×2): 2 g via INTRAVENOUS
  Filled 2021-03-20 (×2): qty 20

## 2021-03-20 MED ORDER — IPRATROPIUM-ALBUTEROL 0.5-2.5 (3) MG/3ML IN SOLN
3.0000 mL | Freq: Once | RESPIRATORY_TRACT | Status: AC
  Administered 2021-03-20: 3 mL via RESPIRATORY_TRACT

## 2021-03-20 MED ORDER — PANTOPRAZOLE SODIUM 40 MG IV SOLR
40.0000 mg | Freq: Every day | INTRAVENOUS | Status: DC
  Administered 2021-03-20: 40 mg via INTRAVENOUS

## 2021-03-20 MED ORDER — FLUTICASONE PROPIONATE 50 MCG/ACT NA SUSP
2.0000 | Freq: Every day | NASAL | Status: DC
  Administered 2021-03-21: 2 via NASAL
  Filled 2021-03-20: qty 16

## 2021-03-20 MED ORDER — SODIUM CHLORIDE 0.9 % IV SOLN: 250.0000 mL | INTRAVENOUS | Status: DC

## 2021-03-20 MED ORDER — ONDANSETRON HCL 4 MG/2ML IJ SOLN: 4.0000 mg | Freq: Four times a day (QID) | INTRAMUSCULAR | Status: DC | PRN

## 2021-03-20 MED ORDER — ACETAMINOPHEN 325 MG PO TABS
650.0000 mg | ORAL_TABLET | ORAL | Status: DC | PRN
  Administered 2021-03-21: 650 mg via ORAL
  Filled 2021-03-20: qty 2

## 2021-03-20 MED ORDER — LACTATED RINGERS IV BOLUS (SEPSIS)
1000.0000 mL | Freq: Once | INTRAVENOUS | Status: AC
  Administered 2021-03-20: 1000 mL via INTRAVENOUS

## 2021-03-20 MED ORDER — METHYLPREDNISOLONE ACETATE 80 MG/ML IJ SUSP
80.0000 mg | Freq: Once | INTRAMUSCULAR | Status: AC
Start: 2021-03-20 — End: 2021-03-20
  Administered 2021-03-20: 80 mg via INTRAMUSCULAR

## 2021-03-20 MED ORDER — LACTATED RINGERS IV BOLUS (SEPSIS)
250.0000 mL | Freq: Once | INTRAVENOUS | Status: AC
  Administered 2021-03-20: 250 mL via INTRAVENOUS

## 2021-03-20 MED ORDER — BUDESONIDE 0.25 MG/2ML IN SUSP
0.2500 mg | Freq: Two times a day (BID) | RESPIRATORY_TRACT | Status: DC
  Filled 2021-03-20: qty 2

## 2021-03-20 MED ORDER — METHYLPREDNISOLONE SODIUM SUCC 125 MG IJ SOLR
60.0000 mg | Freq: Two times a day (BID) | INTRAMUSCULAR | Status: DC
  Administered 2021-03-20 – 2021-03-22 (×3): 60 mg via INTRAVENOUS

## 2021-03-20 MED ORDER — ALBUTEROL (5 MG/ML) CONTINUOUS INHALATION SOLN
10.0000 mg/h | INHALATION_SOLUTION | RESPIRATORY_TRACT | Status: DC
  Administered 2021-03-20: 10 mg/h via RESPIRATORY_TRACT
  Filled 2021-03-20: qty 20

## 2021-03-20 MED ORDER — DOCUSATE SODIUM 100 MG PO CAPS: 100.0000 mg | ORAL_CAPSULE | Freq: Two times a day (BID) | ORAL | Status: DC | PRN

## 2021-03-20 MED ORDER — REVEFENACIN 175 MCG/3ML IN SOLN
175.0000 ug | Freq: Every day | RESPIRATORY_TRACT | Status: DC
  Filled 2021-03-20 (×3): qty 3

## 2021-03-20 MED ORDER — VANCOMYCIN HCL 1500 MG/300ML IV SOLN
1500.0000 mg | Freq: Once | INTRAVENOUS | Status: AC
  Administered 2021-03-20: 1500 mg via INTRAVENOUS
  Filled 2021-03-20: qty 300

## 2021-03-20 MED ORDER — ALBUTEROL (5 MG/ML) CONTINUOUS INHALATION SOLN
INHALATION_SOLUTION | RESPIRATORY_TRACT | Status: AC
  Filled 2021-03-20: qty 20

## 2021-03-20 MED ORDER — VANCOMYCIN HCL 1500 MG/300ML IV SOLN: 1500.0000 mg | INTRAVENOUS | Status: DC

## 2021-03-20 MED ORDER — HEPARIN SODIUM (PORCINE) 5000 UNIT/ML IJ SOLN
5000.0000 [IU] | Freq: Three times a day (TID) | INTRAMUSCULAR | Status: DC
  Administered 2021-03-20 – 2021-03-21 (×2): 5000 [IU] via SUBCUTANEOUS
  Filled 2021-03-20 (×3): qty 1

## 2021-03-20 MED ORDER — SODIUM CHLORIDE 0.9 % IV SOLN: 2.0000 g | INTRAVENOUS | Status: DC

## 2021-03-20 MED ORDER — NOREPINEPHRINE 4 MG/250ML-% IV SOLN
2.0000 ug/min | INTRAVENOUS | Status: DC
  Administered 2021-03-20 – 2021-03-21 (×2): 6 ug/min via INTRAVENOUS
  Administered 2021-03-21: 7 ug/min via INTRAVENOUS
  Filled 2021-03-20 (×2): qty 250

## 2021-03-20 MED ORDER — FLUOXETINE HCL 20 MG PO CAPS
40.0000 mg | ORAL_CAPSULE | Freq: Every day | ORAL | Status: DC
  Administered 2021-03-21 – 2021-03-22 (×2): 40 mg via ORAL
  Filled 2021-03-20 (×3): qty 2

## 2021-03-20 MED ORDER — INSULIN ASPART 100 UNIT/ML IJ SOLN
0.0000 [IU] | INTRAMUSCULAR | Status: DC
  Administered 2021-03-20: 5 [IU] via SUBCUTANEOUS
  Administered 2021-03-21: 3 [IU] via SUBCUTANEOUS
  Administered 2021-03-21: 5 [IU] via SUBCUTANEOUS
  Administered 2021-03-21 (×2): 3 [IU] via SUBCUTANEOUS
  Administered 2021-03-21: 5 [IU] via SUBCUTANEOUS
  Administered 2021-03-22: 2 [IU] via SUBCUTANEOUS
  Filled 2021-03-20: qty 0.15

## 2021-03-20 NOTE — ED Notes (Signed)
EDP made aware of patients status. Patient is improving.

## 2021-03-20 NOTE — ED Notes (Signed)
Patient made aware we need UA sample. Urinal at bedside. Patient will call out once voided. Call bell at bedside.   Wife at bedside.

## 2021-03-20 NOTE — ED Triage Notes (Signed)
Patient arrived via [facility] from doctors office [facility]  Called out for repsiraotry distress. PCP was thinking pneumonia. Patient has been ongoing problems for shob and not feeling good for over a week. No fever today.    66% RA on arrival.  Patient placed on 4 liter 02 that did not seem to help. 0.5 mg Atrovent at doctors office.  Depomedrol given by doctors office  10mg  albuterol total given  2g of magnesium given by ems 1500 ns fluid given by ems  Cpap- 93% and is improving in color as well.   Hx: Lung cancer and throat cancer  Lobectomy- right lung per family    HR-122 now 35 BP- 110/70 now 94/52 and 102/60  RR-24 12 lead unremarkable per ems   A/Ox4

## 2021-03-20 NOTE — Progress Notes (Signed)
eLink Physician-Brief Progress Note Patient Name: [name] DOB: [birth year] MRN: [id number]   Date of Service  03/20/2021  HPI/Events of Note  Per MD / NP note in the chart, patient made himself a DNI / DNR earlier this evening following a conversation involving patient and his spouse,along witt and NP. However order in the chart states "Partial Code". I tried to clarify this with the patient but he was extremely uncooperative and belligerent, insisting that he be left alone. I could not conduct a meaningful conversation with patient. I therefore called his wife, who was present for the earlier conversation. She confirmed his DNI / DNR status   eICU Interventions  Code status changed to DNI / DNR.        Frederik Pear 03/20/2021, 9:34 PM

## 2021-03-20 NOTE — ED Notes (Signed)
EDP at bedside  

## 2021-03-20 NOTE — Progress Notes (Signed)
Subjective:    Patient ID: [name], male    DOB: 1951/05/15, 70 y.o.   MRN: [id number]  HPI Patient presents today with 2 weeks of shortness of breath and chest congestion and cough.  On room air, his oxygen saturations were in the 60s.  On 4 L they increased to 81%.  He has increased work of breathing.  On examination, he has diffuse rhonchorous breath sounds with decreased air movement in all 4 fields and expiratory wheezing all throughout.  He states that he has had 2 albuterol neb treatments this morning prior to showing up.  Immediately upon arrival, the patient was put on 4 L of oxygen.  He was given 80 mg of Depo-Medrol IM x1 and was given a 2.5 mg albuterol and 0.5 mg Atrovent combination neb treatment.  The patient appears to be having a severe COPD exacerbation.  Based on his pulmonary exam I cannot rule out bilateral pneumonia.  He is certainly in oxygen dependent respiratory failure.  He needs to go directly to the emergency room. Past Medical History:  Diagnosis Date  . Allergy   . Anemia   . Anginal pain ([place])   . Arthritis    "hands"  . Asthma   . Back pain    4 deteriorating disc and receives an injection q3-46mon;has been doing this for about 57yrs  . Blood transfusion    as a child  . Bronchitis   . Cancer ([place]) dx'd 2013   Non-small cell lung cancer  . Chronic kidney disease    acute kidney failure post surgery  . COPD (chronic obstructive pulmonary disease) (HCC)    uses Albuterol and Spiriva daily  . Coronary artery disease    has 1 stent  . Depression    takes Prozac daily  . Emphysema    sees Dr.Ramaswami for this  . Family history of adverse reaction to anesthesia    mother was hard to wake up sometimes  . GERD (gastroesophageal reflux disease)    takes Prilosec daily, EGD nml 06/2017  . Glaucoma    hx of  . H/O hiatal hernia   . Hypertension    takes  HYzaar daily  . Insomnia    takes Trazodone nightly  . Lung mass    right upper lobe  .  Myocardial infarct ([place])   . 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 Obesity   . 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 Periodic limb movements of sleep   . Peripheral neuropathy   . Pneumonia    hx of' 70 yo,rd' last time in 2013  . Productive cough    white in color but no odor  . Shortness of breath    with exertion   . Sleep apnea    uses BiPaP; "no longer have sleep apnea since I have lost over 100 lbs"  . Syncope and collapse 05/26/2013  . Thyroid disease   . Type II diabetes mellitus (HCC)    takes Metformin bid and Novolog and Lantus daily   Past Surgical History:  Procedure Laterality Date  . CARDIAC CATHETERIZATION  2006/2008/2009  . CARPAL TUNNEL RELEASE     bilateral  . COLONOSCOPY    . CORONARY ANGIOPLASTY WITH STENT PLACEMENT     1 stent  . ELBOW SURGERY     ulnar nerve; left  . ESOPHAGOGASTRODUODENOSCOPY    . EYE SURGERY     pt. denies  .  FUDUCIAL PLACEMENT Left 08/24/2016   Procedure: PLACEMENT OF FUDUCIAL;  Surgeon: Melrose Nakayama, MD;  Location: [facility];  Service: Thoracic;  Laterality: Left;  . INGUINAL HERNIA REPAIR  1990's   double inguinal  . LOBECTOMY  03/17/2012   upper right side with resection  . MICROLARYNGOSCOPY WITH CO2 LASER AND EXCISION OF VOCAL CORD LESION N/A 06/28/2017   Procedure: MICROLARYNGOSCOPY WITH BIOPSY;  Surgeon: Melida Quitter, MD;  Location: [facility];  Service: ENT;  Laterality: N/A;  . MICROLARYNGOSCOPY WITH CO2 LASER AND EXCISION OF VOCAL CORD LESION N/A 07/19/2017   Procedure: MICROLARYNGOSCOPY WITH CO2 LASER AND EXCISION OF VOCAL CORD LESION;  Surgeon: Melida Quitter, MD;  Location: [facility];  Service: ENT;  Laterality: N/A;  MICRO DIRECT LARYNGOSCOPY WITH CO2 LASER VOCAL CORD STRIPPING/POSS JET VENTURI VENTILATION  . REFRACTIVE SURGERY     bilaterally  . ROTATOR CUFF REPAIR     left  . VIDEO BRONCHOSCOPY WITH ENDOBRONCHIAL NAVIGATION N/A 08/24/2016   Procedure: VIDEO BRONCHOSCOPY WITH ENDOBRONCHIAL NAVIGATION;   Surgeon: Melrose Nakayama, MD;  Location: [facility];  Service: Thoracic;  Laterality: N/A;   Current Outpatient Medications on File Prior to Visit  Medication Sig Dispense Refill  . acetaminophen (TYLENOL) 500 MG tablet Take 1,000 mg by mouth every 6 (six) hours as needed for moderate pain.    Marland Kitchen atorvastatin (LIPITOR) 40 MG tablet Take 40 mg by mouth daily.    . Budeson-Glycopyrrol-Formoterol (BREZTRI AEROSPHERE) 160-9-4.8 MCG/ACT AERO Inhale 2 puffs into the lungs in the morning and at bedtime. 5.9 g 0  . diltiazem (CARDIZEM CD) 120 MG 24 hr capsule Take 1 capsule (120 mg total) by mouth daily. 90 capsule 3  . donepezil (ARICEPT) 5 MG tablet TAKE 1 TABLET BY MOUTH EVERYDAY AT BEDTIME 90 tablet 3  . FLUoxetine (PROZAC) 40 MG capsule TAKE 1 CAPSULE BY MOUTH EVERY DAY 90 capsule 1  . fluticasone (FLONASE) 50 MCG/ACT nasal spray Place 2 sprays into both nostrils as needed.   5  . gabapentin (NEURONTIN) 300 MG capsule TAKE 1 CAPSULE BY MOUTH THREE TIMES A DAY 270 capsule 2  . glucose blood (ONETOUCH ULTRA) test strip TEST ONCE DAILY E11.42    . ipratropium-albuterol (DUONEB) 0.5-2.5 (3) MG/3ML SOLN Take 3 mLs by nebulization every 6 (six) hours as needed. 360 mL 11  . losartan (COZAAR) 25 MG tablet Take 25 mg by mouth daily.    . Multiple Vitamin (MULTIVITAMIN WITH MINERALS) TABS Take 1 tablet by mouth daily.    . nitroGLYCERIN (NITROSTAT) 0.4 MG SL tablet Place 0.4 mg under the tongue every 5 (five) minutes as needed for chest pain.    Marland Kitchen ondansetron (ZOFRAN) 4 MG tablet Take 1 tablet (4 mg total) by mouth every 8 (eight) hours as needed for nausea or vomiting. 20 tablet 0  . ONE TOUCH ULTRA TEST test strip     . traZODone (DESYREL) 100 MG tablet Take 2 tablets (200 mg total) by mouth at bedtime as needed for sleep. 180 tablet 3  . [DISCONTINUED] albuterol (VENTOLIN HFA) 108 (90 Base) MCG/ACT inhaler TAKE 2 PUFFS BY MOUTH EVERY 6 HOURS AS NEEDED FOR WHEEZE OR SHORTNESS OF BREATH     Current  Facility-Administered Medications on File Prior to Visit  Medication Dose Route Frequency Provider Last Rate Last Admin  . albuterol (PROVENTIL) (2.5 MG/3ML) 0.083% nebulizer solution 2.5 mg  2.5 mg Nebulization Once Delsa Grana, PA-C       Allergies  Allergen Reactions  . Actos [  Pioglitazone] Swelling    EDEMA REACTION UNSPECIFIED  . Avandia [Rosiglitazone] Swelling    SWELLING REACTION UNSPECIFIED   . Dilaudid [Hydromorphone Hcl] Other (See Comments)    Made him crazy   Social History   Socioeconomic History  . Marital status: Married    Spouse name: mary lou  . Number of children: 3  . Years of education: trade   . Highest education level: Not on file  Occupational History  . Occupation: truck Geophysicist/field seismologist    Comment: retired  Tobacco Use  . Smoking status: Current Every Day Smoker    Packs/day: 3.00    Years: 54.00    Pack years: 162.00    Types: Cigarettes    Start date: 1963  . Smokeless tobacco: Never Used  . Tobacco comment: 1ppd as of 01/04/21  ep  Vaping Use  . Vaping Use: Former  Substance and Sexual Activity  . Alcohol use: No    Comment: no alcolol since 2013  . Drug use: Yes    Types: Cocaine, Marijuana    Comment: quit 1990's  . Sexual activity: Not Currently  Other Topics Concern  . 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   Social Determinants of Health   Financial Resource Strain: Low Risk   . Difficulty of Paying Living Expenses: Not very hard  Food Insecurity: Not on file  Transportation Needs: Not on file  Physical Activity: Not on file  Stress: Not on file  Social Connections: Not on file  Intimate Partner Violence: Not on file      Review of Systems  All other systems reviewed and are negative.      Objective:   Physical  Exam Vitals reviewed.  Constitutional:      General: He is not in acute distress.    Appearance: He is ill-appearing. He is not toxic-appearing.  Cardiovascular:     Rate and Rhythm: Regular rhythm. Tachycardia present.     Heart sounds: Normal heart sounds.  Pulmonary:     Effort: Tachypnea and respiratory distress present.     Breath sounds: Decreased air movement present. Decreased breath sounds, wheezing, rhonchi and rales present.  Musculoskeletal:     Right lower leg: No edema.     Left lower leg: No edema.  Neurological:     Mental Status: He is alert.           Assessment & Plan:   COPD exacerbation ([place])  Acute on chronic respiratory failure with hypoxia ([place])  Patient appears to be having a COPD exacerbation.  He was given 80 mg of Depo-Medrol IM x1 now and was given a 2.5 mg albuterol/0.5 mg Atrovent neb x1.  EMS was contacted for transport to the emergency room.  Patient is hesitant to want to go the emergency room however I explained to the patient and his wife that he does not have a choice.  He is in respiratory distress and requires emergency room evaluation.  Was up to 90% on 5 L by the time EMS arrived.  Had received second albuterol neb  (5 mg total).  Breath sounds were starting to improve (L>R).

## 2021-03-20 NOTE — Progress Notes (Signed)
Pt refused all respiratory therapies this round.

## 2021-03-20 NOTE — Hospital Course (Addendum)
HPI (5/16) chronically ill-appearing 70 year old male patient with multiple comorbidities as mentioned below.  He lives with his wife, and at baseline for over the last year has had significant exertional dyspnea reporting shortness of breath even doing simple activities of daily living such as getting dressed.  He has had chronic vocal hoarseness as a consequence of radiation therapy to his vocal cords, as well as chronic cough with difficulty with mucus clearance.  Presented initially to his primary care provider with chief complaint of about 2-week history of decreased activity tolerance, worsening cough, poor appetite, and increased shortness of breath.  He had had no sick exposures.  He did have subjective fever and chills.  He noted over the course of the prior 2 weeks up to the time of presentation on 5/16 his shortness of breath had gotten worse,His sputum changed from clear to green color around 5/12 or 5/13 and because of this his wife insisted he see his doctor.  The office was closed on 5/13 so she called the first thing in the morning on 5/16 and was seen by his primary care provider.  In his 8 office his room air saturations were noted at 60%, he was placed on supplemental oxygen and titrated up to 4 L and still only saturated 81%.  He was given a short acting bronchodilator, intramuscular Depo-Medrol, and sent directly to the emergency room.  In the emergency room he he remained hypoxic until the addition of nonrebreather mask, he was hypotensive in route to the emergency room and given 1.5 L of crystalloid, remained hypotensive and was started on norepinephrine.  Critical care asked to admit.   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, currently on observation Chronic vocal hoarseness Chronic left-sided rib pain with left-sided rib fractures felt a consequence of radiation therapy Chronic vocal hoarseness Chronic kidney disease stage Chronic obstructive pulmonary disease  Active tobacco abuse Coronary artery disease with prior drug-eluting stent to the RCA this is remote Obstructive sleep apnea on CPAP Type 2 diabetes on long-term insulin Hypertension Neurocardiac syncope   Significant Hospital Events: Including procedures, antibiotic start and stop dates in addition to other pertinent events   5/16: Admitted with acute hypoxic respiratory failure in the setting of aspiration pneumonia versus community-acquired pneumonia (POA), with resultant septic shock (POA) and acute  on chronic renal failure (POA).  Placed on high flow oxygen via nonrebreather, systemic steroids initiated as well as scheduled bronchodilators.  Administered 30 mL/kg crystalloid intravenously for hypotension and started on peripheral norepinephrine.  Cultures sent, and started on azithromycin, ceftriaxone, and vancomycin.  Goals of care discussion with nurse practitioner, attending Dr., Patient and his wife present.  Patient desires DO NOT RESUSCITATE with no intubation should he arrest based on his chronic illnesses based on his advanced directives prior to admission.  He was admitted to the intensive care 5/17 initially still on pressors during AM rounds but clinically a little better. Late in afternoon was insistent on leaving AMA as wanted to eat and drink. Pulled out IV line. After bedside discussion we changed diet to regular. Made the decision not to resume pressors. In the [place] time he agreed to stay so  that we could 1) get palliative to see him 2) develop symptom management plan and 3) ensure that it could be  successful. Antibiotics changed to monotherapy w/ levaquin.

## 2021-03-20 NOTE — H&P (Signed)
NAME:  [name], MRN:  [id number], DOB:  [birth year], LOS: 0 ADMISSION DATE:  03/20/2021, CONSULTATION DATE:  5.14 REFERRING MD:  Billy Fischer MD CHIEF COMPLAINT:  Shortness of breath with hypoxia (POA)   History of Present Illness:  This is a chronically ill-appearing 70 year old male patient with multiple comorbidities as mentioned below.  He lives with his wife, and at baseline for over the last year has had significant exertional dyspnea reporting shortness of breath even doing simple activities of daily living such as getting dressed.  He has had chronic vocal hoarseness as a consequence of radiation therapy to his vocal cords, as well as chronic cough with difficulty with mucus clearance.  Presented initially to his primary care provider with chief complaint of about 2-week history of decreased activity tolerance, worsening cough, poor appetite, and increased shortness of breath.  He had had no sick exposures.  He did have subjective fever and chills.  He noted over the course of the prior 2 weeks up to the time of presentation on 5/16 his shortness of breath had gotten worse,His sputum changed from clear to green color around 5/12 or 5/13 and because of this his wife insisted he see his doctor.  The office was closed on 5/13 so she called the first thing in the morning on 5/16 and was seen by his primary care provider.  In his 62 office his room air saturations were noted at 60%, he was placed on supplemental oxygen and titrated up to 4 L and still only saturated 81%.  He was given a short acting bronchodilator, intramuscular Depo-Medrol, and sent directly to the emergency room.  In the emergency room he he remained hypoxic until the addition of nonrebreather mask, he was hypotensive in route to the emergency room and given 1.5 L of crystalloid, remained hypotensive and was started on norepinephrine.  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, currently on observation Chronic vocal hoarseness Chronic left-sided rib pain with left-sided rib fractures felt a consequence of radiation therapy Chronic vocal hoarseness Chronic kidney disease stage Chronic obstructive pulmonary disease  Active tobacco abuse Coronary artery disease with prior drug-eluting stent to the RCA this is remote Obstructive sleep apnea on CPAP Type 2 diabetes on long-term insulin Hypertension Neurocardiac syncope Significant Hospital Events: Including procedures, antibiotic start and stop dates in addition to other pertinent events   5/16: Admitted with acute hypoxic respiratory failure in the setting of aspiration pneumonia versus community-acquired pneumonia (POA), with resultant septic shock (POA) and acute  on chronic renal failure (POA).  Placed on high flow oxygen via nonrebreather, systemic steroids initiated as well as scheduled bronchodilators.  Administered 30 mL/kg crystalloid intravenously for hypotension and started on peripheral norepinephrine.  Cultures sent, and started on azithromycin, ceftriaxone, and vancomycin.  Goals of care discussion with nurse practitioner, attending Dr., Patient and his wife present.  Patient desires DO NOT RESUSCITATE with no intubation should he arrest based on his chronic illnesses based on his advanced directives prior to admission.  He was admitted to the intensive care Interim History / Subjective:  Feels a little better with the supplemental oxygen  Objective   Blood pressure (Abnormal) 102/59, pulse 95, temperature 99.2 F (37.3 C), temperature source  Rectal, resp. rate 20, SpO2 98 %.        Intake/Output Summary (Last 24 hours) at 03/20/2021 1643 Last data filed at 03/20/2021 1543 Gross per 24 hour  Intake 2599.15 ml  Output no documentation  Net 2599.15 ml   There were no vitals filed for this visit.  Examination: General: Frail 70 year old male patient currently in acute distress on both high flow and nonrebreather mask HENT: Phonation is very hoarse, rhonchus respiratory efforts, difficult to understand given work of breathing and hoarse voice no JVD mucous membranes moist sclera nonicteric Lungs: Mild accessory use currently on high flow oxygen diffuse scattered rhonchi diminished in the right greater than left Cardiovascular: Regular rate and rhythm no murmur rub or gallop Abdomen: Soft not tender no organomegaly Extremities: Warm dry with brisk capillary refill Neuro: Alert, oriented, appropriate.  Moves all extremities does endorse generalized weakness GU: Due to void  Labs/imaging that I havepersonally reviewed  (right click and "Reselect all SmartList Selections" daily)  see below Resolved Hospital Problem list     Assessment & Plan:  Acute hypoxic respiratory failure (POA) in the setting of right greater than left pneumonia, further complicated by acute exacerbation of chronic obstructive pulmonary disease, and ongoing tobacco abuse.  Favor aspiration given history of radiation related vocal cord injury, however community-acquired pneumonia not ruled out Portable chest x-ray personally reviewed: Showing significant right greater than left airspace disease Currently on high flow and nonrebreather oxygen Plan Admit to intensive care Supplemental oxygen Aggressive pulmonary hygiene Scheduled Brovana, Pulmicort, and Yupelri Systemic steroids Flutter valve N.p.o. except meds Day #1 azithromycin, vancomycin, and ceftriaxone Follow-up pending cultures Would offer elective short-term intubation however if he were to  have cardiopulmonary arrest he is full DO NOT RESUSCITATE/DNI based on our discussion in the emergency room Swallowing evaluation if he improves Needs to stop smoking  Severe sepsis/septic shock (POA) secondary to above He has completed 30 mL/kg predicted body weight crystalloid Plan Continue gentle hydration Continue peripheral norepinephrine titrating for mean arterial pressure greater than 65 Admit to intensive care Antibiotics as mentioned above Hold losartan and Cardizem  Acute on chronic kidney disease (POA) Creatinine clearance ranging from 35-46 at baseline 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 chemistry  Hyperglycemia with type 2 diabetes with history of neuropathy and evidence of endorgan involvement Plan Sliding scale insulin  Chronic anemia without evidence of bleeding Plan Trend CBC  History of coronary artery disease with remote stent to RCA, history of hypertension, history of hyperlipidemia Plan Holding antihypertensives Continue Lipitor Telemetry monitoring  History of non-small cell lung cancer initially involving right upper lobe requiring resection, then developed recurrence involving left lower lobe status post radiation, further complicated by right vocal cord squamous cell carcinoma treated with radiation All status post therapy and under surveillance without evidence of progression Plan Follow-up with oncology and ENT  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 Plan Swallow eval  History of obstructive sleep apnea with poor compliance with CPAP I am not sure given his cough mechanics CPAP is a safe treatment Plan Continue nocturnal pulse oximetry Titrate oxygen  Best practice (right click and "Reselect all SmartList Selections" daily)  Diet:  NPO    Pain/Anxiety/Delirium protocol (if indicated): No VAP protocol (if indicated): Not indicated DVT  prophylaxis: Subcutaneous Heparin GI prophylaxis: PPI Glucose control:  SSI Yes  Central venous access:  N/A Arterial line:  N/A Foley:  N/A Mobility:  bed rest  PT consulted: N/A Last date of multidisciplinary goals of care discussion [in ER on admit] Code Status:  DNR Disposition: to ICU  Labs   CBC: Recent Labs  Lab 03/20/21 1248  WBC 12.8*  NEUTROABS 10.3*  HGB 11.3*  HCT 35.2*  MCV 97.5  PLT 409    Basic Metabolic Panel: Recent Labs  Lab 03/20/21 1248  NA 137  K 4.0  CL 105  CO2 23  GLUCOSE 262*  BUN 71*  CREATININE 2.45*  CALCIUM 8.5*   GFR: Estimated Creatinine Clearance: 28.8 mL/min (A) (by C-G formula based on SCr of 2.45 mg/dL (H)). Recent Labs  Lab 03/20/21 1248  WBC 12.8*  LATICACIDVEN 1.1    Liver Function Tests: Recent Labs  Lab 03/20/21 1248  AST 28  ALT 38  ALKPHOS 90  BILITOT 0.4  PROT 6.6  ALBUMIN 2.8*   No results for input(s): LIPASE, AMYLASE in the last 168 hours. No results for input(s): AMMONIA in the last 168 hours.  ABG    Component Value Date/Time   PHART 7.471 (H) 09/21/2012 1135   PCO2ART 36.9 09/21/2012 1135   PO2ART 57.0 (L) 09/21/2012 1135   HCO3 26.9 (H) 09/21/2012 1135   TCO2 28 09/21/2012 1135   ACIDBASEDEF 0.5 04/23/2012 1351   O2SAT 91.0 09/21/2012 1135     Coagulation Profile: Recent Labs  Lab 03/20/21 1248  INR 1.2    Cardiac Enzymes: No results for input(s): CKTOTAL, CKMB, CKMBINDEX, TROPONINI in the last 168 hours.  HbA1C: Hgb A1c MFr Bld  Date/Time Value Ref Range Status  03/29/2020 09:18 AM 5.9 (H) <5.7 % of total Hgb Final    Comment:    For someone without known diabetes, a hemoglobin  A1c value between 5.7% and 6.4% is consistent with prediabetes and should be confirmed with a  follow-up test. . For someone with known diabetes, a value <7% indicates that their diabetes is well controlled. A1c targets should be individualized based on duration of diabetes, age, comorbid  conditions, and other considerations. . This assay result is consistent with an increased risk of diabetes. . Currently, no consensus exists regarding use of hemoglobin A1c for diagnosis of diabetes for children. .   08/23/2016 12:44 PM 7.2 (H) 4.8 - 5.6 % Final    Comment:    (NOTE)         Pre-diabetes: 5.7 - 6.4         Diabetes: >6.4         Glycemic control for adults with diabetes: <7.0     CBG: No results for input(s): GLUCAP in the last 168 hours.  Review of Systems:   Review of Systems  Constitutional: Positive for chills, fever, malaise/fatigue and weight loss.  HENT: Positive for congestion.   Eyes: Negative.   Respiratory: Positive for cough, hemoptysis, sputum production, shortness of breath and wheezing.   Cardiovascular: Positive for chest pain. Negative for leg swelling.  Gastrointestinal: Negative.   Genitourinary: Negative.   Musculoskeletal: Positive for myalgias.  Skin: Negative.   Neurological: Positive for weakness.  Endo/Heme/Allergies: Negative.   Psychiatric/Behavioral: Negative.      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   Surgical History:   Past Surgical History:  Procedure Laterality Date  . CARDIAC CATHETERIZATION   2006/2008/2009  . CARPAL TUNNEL RELEASE     bilateral  . COLONOSCOPY    . CORONARY ANGIOPLASTY WITH STENT PLACEMENT     1 stent  . ELBOW SURGERY     ulnar nerve; left  . ESOPHAGOGASTRODUODENOSCOPY    . EYE SURGERY     pt. denies  . FUDUCIAL PLACEMENT Left 08/24/2016   Procedure: PLACEMENT OF FUDUCIAL;  Surgeon: Melrose Nakayama, MD;  Location: [place];  Service: Thoracic;  Laterality: Left;  . INGUINAL HERNIA REPAIR  1990's   double inguinal  . LOBECTOMY  03/17/2012   upper right side with resection  . MICROLARYNGOSCOPY WITH CO2 LASER AND EXCISION OF VOCAL CORD LESION N/A 06/28/2017   Procedure: MICROLARYNGOSCOPY WITH BIOPSY;  Surgeon: Melida Quitter, MD;  Location: [place];  Service: ENT;  Laterality: N/A;  . MICROLARYNGOSCOPY WITH CO2 LASER AND EXCISION OF VOCAL CORD LESION N/A 07/19/2017   Procedure: MICROLARYNGOSCOPY WITH CO2 LASER AND EXCISION OF VOCAL CORD LESION;  Surgeon: Melida Quitter, MD;  Location: [place];  Service: ENT;  Laterality: N/A;  MICRO DIRECT LARYNGOSCOPY WITH CO2 LASER VOCAL CORD STRIPPING/POSS JET VENTURI VENTILATION  . REFRACTIVE SURGERY     bilaterally  . ROTATOR CUFF REPAIR     left  . VIDEO BRONCHOSCOPY WITH ENDOBRONCHIAL NAVIGATION N/A 08/24/2016   Procedure: VIDEO BRONCHOSCOPY WITH ENDOBRONCHIAL NAVIGATION;  Surgeon: Melrose Nakayama, MD;  Location: [place];  Service: Thoracic;  Laterality: N/A;     Social History:   reports that he has been smoking cigarettes. He started smoking about 59 years ago. He has a 162.00 pack-year smoking history. He has never used smokeless tobacco. He reports current drug use. Drugs: Cocaine and Marijuana. He reports that he does not drink alcohol.   Family History:  His family history includes COPD in his mother; Diabetes in his father and mother; Heart attack in his father; Hypertension in his mother. There is no history of Anesthesia problems, Hypotension, Malignant hyperthermia, or Pseudochol deficiency.    Allergies Allergies  Allergen Reactions  . Actos [Pioglitazone] Swelling    EDEMA REACTION UNSPECIFIED  . Avandia [Rosiglitazone] Swelling    SWELLING REACTION UNSPECIFIED   . Dilaudid [Hydromorphone Hcl] Other (See Comments)    Made him crazy     Home Medications  Prior to Admission medications   Medication Sig Start Date End Date Taking? Authorizing Provider  acetaminophen (TYLENOL) 500 MG tablet Take 1,000 mg by mouth every 6 (six) hours as needed for moderate pain.   Yes [provider]  atorvastatin (LIPITOR) 40 MG tablet Take 40 mg by mouth daily.   Yes [provider]  Budeson-Glycopyrrol-Formoterol (BREZTRI AEROSPHERE) 160-9-4.8 MCG/ACT AERO Inhale 2 puffs into the lungs in the morning and at bedtime. 01/04/21  Yes Brand Males, MD  diltiazem (CARDIZEM CD) 120 MG 24 hr capsule Take 1 capsule (120 mg total) by mouth daily. 12/26/20 12/21/21 Yes Belva Crome, MD  FLUoxetine (PROZAC) 40 MG capsule TAKE 1 CAPSULE BY MOUTH EVERY DAY Patient taking differently: Take 40 mg by mouth daily. 11/09/20  Yes [name], MD  fluticasone [facility]) 50 MCG/ACT nasal spray Place 2 sprays into both nostrils daily as needed for allergies. 06/06/17  Yes [provider]  gabapentin (NEURONTIN) 300 MG capsule TAKE 1 CAPSULE BY MOUTH THREE TIMES A DAY Patient taking differently: Take 300 mg by mouth 3 (three) times daily. 09/13/20  Yes [name], MD  ipratropium-albuterol (DUONEB) 0.5-2.5 (3) MG/3ML SOLN Take 3 mLs by nebulization every 6 (six) hours as needed. Patient taking differently: Take 3 mLs by nebulization every 6 (six) hours as needed (shortness of breath or wheezing). 01/04/21  Yes Brand Males, MD  losartan (COZAAR) 25 MG tablet Take 25 mg by mouth daily.   Yes [provider]  Multiple Vitamin (MULTIVITAMIN WITH MINERALS) TABS Take 1 tablet by mouth daily.   Yes [provider]  nitroGLYCERIN (NITROSTAT) 0.4 MG SL tablet Place  0.4 mg under the tongue every 5 (five) minutes as needed for chest pain.   Yes [provider]  traZODone (DESYREL) 100 MG tablet Take 2 tablets (200 mg total) by mouth at bedtime as needed for sleep. 12/01/20  Yes [name], MD  donepezil (ARICEPT) 5 MG tablet TAKE 1 TABLET BY MOUTH EVERYDAY AT BEDTIME Patient not taking: No sig reported 11/09/19   [name], MD  glucose blood (ONETOUCH ULTRA) test strip TEST ONCE DAILY E11.42 11/17/19   [provider]  ondansetron (ZOFRAN) 4 MG tablet Take 1 tablet (4 mg total) by mouth every 8 (eight) hours as needed for nausea or vomiting. Patient not taking: No sig reported 03/23/20   Alycia Rossetti, MD  ONE TOUCH ULTRA TEST test strip  11/20/18   [provider]  albuterol (VENTOLIN HFA) 108 (90 Base) MCG/ACT inhaler TAKE 2 PUFFS BY MOUTH EVERY 6 HOURS AS NEEDED FOR WHEEZE OR SHORTNESS OF BREATH 04/14/19 10/09/19  [provider]     Critical care time: 50 minutes.     Erick Colace ACNP-BC [place] Pager # [phone] OR # [phone] if no answer

## 2021-03-20 NOTE — ED Notes (Signed)
Respiratory at bedside.

## 2021-03-20 NOTE — ED Notes (Signed)
Respiratory made aware breathing treatment is finished. Patient placed on 5 liters 02 [place].

## 2021-03-20 NOTE — ED Provider Notes (Signed)
[facility] DEPT Provider Note   CSN: [id number] Arrival date & time: 03/20/21  1219     History Chief Complaint  Patient presents with  . Respiratory Distress    [name] is a 70 y.o. male.  HPI      70yo male with history of COPD, non-small cell lung cancer, DM, glottic cancer, CAD, who presents with concern for cough and shortness of breath.  Reports worsening cough and shortness of breath over the last 3 weeks. Cough has been productive of yellow mucus with bright red tinges of blood.  He has had worsening shortness of breath over the last week, oxygen saturations were down to 66% in his PCPs office and his physician contacted EMS for transportation to the emergency room.  He had said he did not want to go to the emergency department, however his physician recommended it, and in addition gave him 80 mg of Depo-Medrol IM, 2.5 mg of albuterol, 0.5 mg of Atrovent via neb.  EMS provided additional duo nebs in route to the hospital, given 2mg  Mg and 1500cc fluid.   He denies known fevers.  Denies leg swelling or pain.  No known sick contacts. Denies chest pain. History limited by acuity of condition.   Past Medical History:  Diagnosis Date  . Allergy   . Anemia   . Anginal pain ([place])   . Arthritis    "hands"  . Asthma   . Back pain    4 deteriorating disc and receives an injection q3-43mon;has been doing this for about 23yrs  . Blood transfusion    as a child  . Bronchitis   . Cancer ([place]) dx'd 2013   Non-small cell lung cancer  . Chronic kidney disease    acute kidney failure post surgery  . COPD (chronic obstructive pulmonary disease) (HCC)    uses Albuterol and Spiriva daily  . Coronary artery disease    has 1 stent  . Depression    takes Prozac daily  . Emphysema    sees Dr.Ramaswami for this  . Family history of adverse reaction to anesthesia    mother was hard to wake up sometimes  . GERD (gastroesophageal reflux disease)     takes Prilosec daily, EGD nml 06/2017  . Glaucoma    hx of  . H/O hiatal hernia   . Hypertension    takes  HYzaar daily  . Insomnia    takes Trazodone nightly  . Lung mass    right upper lobe  . Myocardial infarct ([place])   . 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 Obesity   . 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 Periodic limb movements of sleep   . Peripheral neuropathy   . Pneumonia    hx of' 70 yo,rd' last time in 2013  . Productive cough    white in color but no odor  . Shortness of breath    with exertion   . Sleep apnea    uses BiPaP; "no longer have sleep apnea since I have lost over 100 lbs"  . Syncope and collapse 05/26/2013  . Thyroid disease   . Type II diabetes mellitus (HCC)    takes Metformin bid and Novolog and Lantus daily    Patient Active Problem List   Diagnosis Date Noted  . Septic shock ([place]) 03/20/2021  . Rib fracture 08/05/2019  . Primary cancer of left upper lobe of lung ([place]) 01/13/2019  .  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 Chronic sinusitis 07/04/2017  . 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 CAD (coronary artery disease), native coronary artery 09/18/2015  . Orthostatic hypotension 07/20/2015  . Smoking 02/26/2015  . Neurocardiogenic syncope 05/26/2013  . Seizures ([place]) 05/03/2013  . Chronic kidney disease   . Type II diabetes mellitus ([place])   . OSA on CPAP 09/26/2012  . Neuropathic pain of both legs 04/23/2012  . S/P lobectomy of lung 04/04/2012  . Non-small cell carcinoma of lung, stage 1 ([place]) 03/17/2012    Class: Stage 1  . Chest pain, atypical 12/21/2011  . COPD exacerbation ([place]) 09/20/2011  . Hypertension   . Depression   . GERD (gastroesophageal reflux disease)   . CIGARETTE SMOKER 09/25/2007  . DIZZINESS AND GIDDINESS 09/25/2007  . Chronic obstructive pulmonary disease ([place]) 08/22/2007    Past Surgical History:   Procedure Laterality Date  . CARDIAC CATHETERIZATION  2006/2008/2009  . CARPAL TUNNEL RELEASE     bilateral  . COLONOSCOPY    . CORONARY ANGIOPLASTY WITH STENT PLACEMENT     1 stent  . ELBOW SURGERY     ulnar nerve; left  . ESOPHAGOGASTRODUODENOSCOPY    . EYE SURGERY     pt. denies  . FUDUCIAL PLACEMENT Left 08/24/2016   Procedure: PLACEMENT OF FUDUCIAL;  Surgeon: Melrose Nakayama, MD;  Location: [facility];  Service: Thoracic;  Laterality: Left;  . INGUINAL HERNIA REPAIR  1990's   double inguinal  . LOBECTOMY  03/17/2012   upper right side with resection  . MICROLARYNGOSCOPY WITH CO2 LASER AND EXCISION OF VOCAL CORD LESION N/A 06/28/2017   Procedure: MICROLARYNGOSCOPY WITH BIOPSY;  Surgeon: Melida Quitter, MD;  Location: [facility];  Service: ENT;  Laterality: N/A;  . MICROLARYNGOSCOPY WITH CO2 LASER AND EXCISION OF VOCAL CORD LESION N/A 07/19/2017   Procedure: MICROLARYNGOSCOPY WITH CO2 LASER AND EXCISION OF VOCAL CORD LESION;  Surgeon: Melida Quitter, MD;  Location: [facility];  Service: ENT;  Laterality: N/A;  MICRO DIRECT LARYNGOSCOPY WITH CO2 LASER VOCAL CORD STRIPPING/POSS JET VENTURI VENTILATION  . REFRACTIVE SURGERY     bilaterally  . ROTATOR CUFF REPAIR     left  . VIDEO BRONCHOSCOPY WITH ENDOBRONCHIAL NAVIGATION N/A 08/24/2016   Procedure: VIDEO BRONCHOSCOPY WITH ENDOBRONCHIAL NAVIGATION;  Surgeon: Melrose Nakayama, MD;  Location: [facility];  Service: Thoracic;  Laterality: N/A;       Family History  Problem Relation Age of Onset  . COPD Mother   . Diabetes Mother   . Hypertension Mother   . Heart attack Father   . Diabetes Father   . Anesthesia problems Neg Hx   . Hypotension Neg Hx   . Malignant hyperthermia Neg Hx   . Pseudochol deficiency Neg Hx     Social History   Tobacco Use  . Smoking status: Current Every Day Smoker    Packs/day: 3.00    Years: 54.00    Pack years: 162.00    Types: Cigarettes    Start date: 1963  . Smokeless tobacco: Never Used  . Tobacco  comment: 1ppd as of 01/04/21  ep  Vaping Use  . Vaping Use: Former  Substance Use Topics  . Alcohol use: No    Comment: no alcolol since 2013  . Drug use: Yes    Types: Cocaine, Marijuana    Comment: quit 1990's  Home Medications Prior to Admission medications   Medication Sig Start Date End Date Taking? Authorizing Provider  acetaminophen (TYLENOL) 500 MG tablet Take 1,000 mg by mouth every 6 (six) hours as needed for moderate pain.   Yes [provider]  atorvastatin (LIPITOR) 40 MG tablet Take 40 mg by mouth daily.   Yes [provider]  Budeson-Glycopyrrol-Formoterol (BREZTRI AEROSPHERE) 160-9-4.8 MCG/ACT AERO Inhale 2 puffs into the lungs in the morning and at bedtime. 01/04/21  Yes Brand Males, MD  diltiazem (CARDIZEM CD) 120 MG 24 hr capsule Take 1 capsule (120 mg total) by mouth daily. 12/26/20 12/21/21 Yes Belva Crome, MD  FLUoxetine (PROZAC) 40 MG capsule TAKE 1 CAPSULE BY MOUTH EVERY DAY Patient taking differently: Take 40 mg by mouth daily. 11/09/20  Yes [name], MD  fluticasone (FLONASE) 50 MCG/ACT nasal spray Place 2 sprays into both nostrils daily as needed for allergies. 06/06/17  Yes [provider]  gabapentin (NEURONTIN) 300 MG capsule TAKE 1 CAPSULE BY MOUTH THREE TIMES A DAY Patient taking differently: Take 300 mg by mouth 3 (three) times daily. 09/13/20  Yes [name], MD  ipratropium-albuterol (DUONEB) 0.5-2.5 (3) MG/3ML SOLN Take 3 mLs by nebulization every 6 (six) hours as needed. Patient taking differently: Take 3 mLs by nebulization every 6 (six) hours as needed (shortness of breath or wheezing). 01/04/21  Yes Brand Males, MD  losartan (COZAAR) 25 MG tablet Take 25 mg by mouth daily.   Yes [provider]  Multiple Vitamin (MULTIVITAMIN WITH MINERALS) TABS Take 1 tablet by mouth daily.   Yes [provider]  nitroGLYCERIN (NITROSTAT) 0.4 MG SL tablet Place 0.4 mg under the tongue every 5 (five)  minutes as needed for chest pain.   Yes [provider]  traZODone (DESYREL) 100 MG tablet Take 2 tablets (200 mg total) by mouth at bedtime as needed for sleep. 12/01/20  Yes [name], MD  donepezil (ARICEPT) 5 MG tablet TAKE 1 TABLET BY MOUTH EVERYDAY AT BEDTIME Patient not taking: No sig reported 11/09/19   [name], MD  glucose blood (ONETOUCH ULTRA) test strip TEST ONCE DAILY E11.42 11/17/19   [provider]  ondansetron (ZOFRAN) 4 MG tablet Take 1 tablet (4 mg total) by mouth every 8 (eight) hours as needed for nausea or vomiting. Patient not taking: No sig reported 03/23/20   Alycia Rossetti, MD  ONE TOUCH ULTRA TEST test strip  11/20/18   [provider]  albuterol (VENTOLIN HFA) 108 (90 Base) MCG/ACT inhaler TAKE 2 PUFFS BY MOUTH EVERY 6 HOURS AS NEEDED FOR WHEEZE OR SHORTNESS OF BREATH 04/14/19 10/09/19  [provider]    Allergies    Actos [pioglitazone], Avandia [rosiglitazone], and Dilaudid [hydromorphone hcl]  Review of Systems   Review of Systems  Constitutional: Positive for fatigue. Negative for fever.  HENT: Positive for congestion. Negative for sore throat.   Respiratory: Positive for cough, shortness of breath and wheezing.   Cardiovascular: Negative for chest pain.  Gastrointestinal: Negative for abdominal pain, nausea and vomiting.  Skin: Negative for rash.  Neurological: Negative for headaches.    Physical Exam Updated Vital Signs BP (!) 98/47   Pulse 97   Temp 99.2 F (37.3 C) (Rectal)   Resp 20   SpO2 98%   Physical Exam Vitals and nursing note reviewed.  Constitutional:      General: He is not in acute distress.    Appearance: He is well-developed. He  is not diaphoretic.  HENT:     Head: Normocephalic and atraumatic.  Eyes:     Conjunctiva/sclera: Conjunctivae normal.  Cardiovascular:     Rate and Rhythm: Regular rhythm. Tachycardia present.     Heart sounds: Normal heart sounds. No murmur  heard. No friction rub. No gallop.   Pulmonary:     Effort: Pulmonary effort is normal. No respiratory distress.     Breath sounds: Wheezing and rhonchi present. No rales.  Abdominal:     General: There is no distension.     Palpations: Abdomen is soft.     Tenderness: There is no abdominal tenderness. There is no guarding.  Musculoskeletal:     Cervical back: Normal range of motion.  Skin:    General: Skin is warm and dry.  Neurological:     Mental Status: He is alert and oriented to person, place, and time.     ED Results / Procedures / Treatments   Labs (all labs ordered are listed, but only abnormal results are displayed) Labs Reviewed  COMPREHENSIVE METABOLIC PANEL - Abnormal; Notable for the following components:      Result Value   Glucose, Bld 262 (*)    BUN 71 (*)    Creatinine, Ser 2.45 (*)    Calcium 8.5 (*)    Albumin 2.8 (*)    GFR, Estimated 27 (*)    All other components within normal limits  CBC WITH DIFFERENTIAL/PLATELET - Abnormal; Notable for the following components:   WBC 12.8 (*)    RBC 3.61 (*)    Hemoglobin 11.3 (*)    HCT 35.2 (*)    Neutro Abs 10.3 (*)    Monocytes Absolute 1.4 (*)    Abs Immature Granulocytes 0.17 (*)    All other components within normal limits  PROTIME-INR - Abnormal; Notable for the following components:   Prothrombin Time 15.3 (*)    All other components within normal limits  BRAIN NATRIURETIC PEPTIDE - Abnormal; Notable for the following components:   B Natriuretic Peptide 145.5 (*)    All other components within normal limits  TROPONIN I (HIGH SENSITIVITY) - Abnormal; Notable for the following components:   Troponin I (High Sensitivity) 66 (*)    All other components within normal limits  RESP PANEL BY RT-PCR (FLU A&B, COVID) ARPGX2  CULTURE, BLOOD (ROUTINE X 2)  CULTURE, BLOOD (ROUTINE X 2)  URINE CULTURE  EXPECTORATED SPUTUM ASSESSMENT W GRAM STAIN, RFLX TO RESP C  MRSA PCR SCREENING  LACTIC ACID, PLASMA  APTT   LACTIC ACID, PLASMA  URINALYSIS, ROUTINE W REFLEX MICROSCOPIC  PROCALCITONIN  HEMOGLOBIN A1C  TROPONIN I (HIGH SENSITIVITY)    EKG EKG Interpretation  Date/Time:  Monday Mar 20 2021 12:35:58 EDT Ventricular Rate:  96 PR Interval:  148 QRS Duration: 75 QT Interval:  331 QTC Calculation: 419 R Axis:   56 Text Interpretation: Sinus rhythm Atrial premature complexes Nonspecific T abnormalities, lateral leads Artifact/baseline wander Confirmed by Gareth Morgan [phone]) on 03/20/2021 4:46:06 PM   Radiology DG Chest Port 1 View  Result Date: 03/20/2021 CLINICAL DATA:  Respiratory distress EXAM: PORTABLE CHEST 1 VIEW COMPARISON:  Chest CT 11/16/2020, radiograph 03/24/2020 FINDINGS: Unchanged cardiomediastinal silhouette. There are bilateral airspace opacities, right greater than left in the mid to lower lungs. Small right pleural effusion. No visible pneumothorax. No acute osseous abnormality. IMPRESSION: Bilateral airspace opacities, right greater than left in the mid to lower lungs, compatible with multifocal pneumonia and/or pulmonary edema. Small right pleural  effusion. Electronically Signed   By: Maurine Simmering   On: 03/20/2021 14:15    Procedures .Critical Care Performed by: Gareth Morgan, MD Authorized by: Gareth Morgan, MD   Critical care provider statement:    Critical care time (minutes):  45   Critical care was necessary to treat or prevent imminent or life-threatening deterioration of the following conditions:  Sepsis   Critical care was time spent personally by me on the following activities:  Evaluation of patient's response to treatment, examination of patient, ordering and performing treatments and interventions, ordering and review of laboratory studies, ordering and review of radiographic studies, pulse oximetry, re-evaluation of patient's condition, obtaining history from patient or surrogate and review of old charts     Medications Ordered in ED Medications   albuterol (PROVENTIL,VENTOLIN) solution continuous neb (0 mg/hr Nebulization Stopped 03/20/21 1324)  albuterol (VENTOLIN) (5 MG/ML) 0.5% continuous inhalation solution (  Not Given 03/20/21 1237)  lactated ringers infusion ( Intravenous New Bag/Given 03/20/21 1400)  cefTRIAXone (ROCEPHIN) 2 g in sodium chloride 0.9 % 100 mL IVPB (0 g Intravenous Stopped 03/20/21 1350)  azithromycin (ZITHROMAX) 500 mg in sodium chloride 0.9 % 250 mL IVPB (0 mg Intravenous Stopped 03/20/21 1543)  norepinephrine (LEVOPHED) 4mg  in 247mL premix infusion (4 mcg/min Intravenous Rate/Dose Change 03/20/21 1604)  methylPREDNISolone sodium succinate (SOLU-MEDROL) 125 mg/2 mL injection 60 mg (has no administration in time range)  docusate sodium (COLACE) capsule 100 mg (has no administration in time range)  polyethylene glycol (MIRALAX / GLYCOLAX) packet 17 g (has no administration in time range)  heparin injection 5,000 Units (has no administration in time range)  acetaminophen (TYLENOL) tablet 650 mg (has no administration in time range)  ondansetron (ZOFRAN) injection 4 mg (has no administration in time range)  pantoprazole (PROTONIX) injection 40 mg (has no administration in time range)  arformoterol (BROVANA) nebulizer solution 15 mcg (has no administration in time range)  revefenacin (YUPELRI) nebulizer solution 175 mcg (has no administration in time range)  budesonide (PULMICORT) nebulizer solution 0.25 mg (has no administration in time range)  insulin aspart (novoLOG) injection 0-15 Units (has no administration in time range)  fluticasone (FLONASE) 50 MCG/ACT nasal spray 2 spray (has no administration in time range)  FLUoxetine (PROZAC) capsule 40 mg (has no administration in time range)  vancomycin (VANCOREADY) IVPB 1500 mg/300 mL (has no administration in time range)  lactated ringers bolus 1,000 mL (0 mLs Intravenous Stopped 03/20/21 1350)    And  lactated ringers bolus 1,000 mL (0 mLs Intravenous Stopped 03/20/21  1354)    And  lactated ringers bolus 250 mL (0 mLs Intravenous Stopped 03/20/21 1507)    ED Course  I have reviewed the triage vital signs and the nursing notes.  Pertinent labs & imaging results that were available during my care of the patient were reviewed by me and considered in my medical decision making (see chart for details).    MDM Rules/Calculators/A&P                          70yo male with history of COPD, non-small cell lung cancer, DM, glottic cancer, CAD, who presents with concern for cough and shortness of breath, found to be hypoxic to 66% on room air at his physician's office.     Received Depo-Medrol and nebulizers with his physician's office, as well as magnesium and nebulizers with EMS in addition to 1500 cc of fluid.   Initial blood pressure was  in the 80s, however improved on immediate recheck.  Given concern for productive cough worsening and shortness of breath, called code sepsis and gave empiric antibiotics for community-acquired pneumonia.  Received 2250cc of LR in addition to 1500cc with EMS however has continued hypotension with MAPS below 65 and systolic 35L-21V and norepinephrine initiated.  Initially improved on O2 by [place] but became hypoxic on 6L and placed on NRB.  CXR consistent with multifocal pneumonia, possible underlying edema. BNP 145, no peripheral edema. Labs significant for lactic acid 1.1, Cr elevated to 2.45. Negative COVID/influenza.  Troponin 66, likely in setting of stress.  Evaluation done at time of global IV contrast shortage, and low clinical suspicion for PE at this time.   ICU consulted for admission given concern for septic shock secondary to pneumonia with hypoxia and hypotension on norepinephrine.    Final Clinical Impression(s) / ED Diagnoses Final diagnoses:  Septic shock ([place])  Community acquired pneumonia, unspecified laterality    Rx / DC Orders ED Discharge Orders    None       Gareth Morgan, MD 03/20/21 [phone]

## 2021-03-20 NOTE — Sepsis Progress Note (Signed)
elink monitoring code sepsis.

## 2021-03-20 NOTE — Progress Notes (Signed)
Pharmacy Antibiotic Note  [name] is a 70 y.o. male admitted on 03/20/2021 with pneumonia.  Pharmacy has been consulted for vancomycin dosing.  Pt is a 1 yoM with PMH significant for COPD, NSCLC, glottic cancer presenting with SOB.   Today, 03/20/21  WBC slightly elevated  SCr 2.45, CrCl ~ 28 mL/min  Lactate 1.1  Afebrile  Plan:  Vancomycin 1500 mg IV q48h  Ceftriaxone + azithromycin per MD  Monitor renal function closely. Goal vancomycin AUC 400-550.  MRSA PCR ordered    Temp (24hrs), Avg:98.4 F (36.9 C), Min:97.8 F (36.6 C), Max:99.2 F (37.3 C)  Recent Labs  Lab 03/20/21 1248  WBC 12.8*  CREATININE 2.45*  LATICACIDVEN 1.1    Estimated Creatinine Clearance: 28.8 mL/min (A) (by C-G formula based on SCr of 2.45 mg/dL (H)).    Allergies  Allergen Reactions  . Actos [Pioglitazone] Swelling    EDEMA REACTION UNSPECIFIED  . Avandia [Rosiglitazone] Swelling    SWELLING REACTION UNSPECIFIED   . Dilaudid [Hydromorphone Hcl] Other (See Comments)    Made him crazy    Antimicrobials this admission: ceftriaxone 5/16 >>  azithromycin 5/16 >>  Vancomycin 5/16 >>  Dose adjustments this admission:  Microbiology results: 5/16 BCx:  5/16 UCx:   5/16 MRSA PCR:   Thank you for allowing pharmacy to be a part of this patient's care.  Lenis Noon, PharmD 03/20/2021 4:42 PM

## 2021-03-21 ENCOUNTER — Inpatient Hospital Stay (HOSPITAL_COMMUNITY): Payer: PPO

## 2021-03-21 DIAGNOSIS — A419 Sepsis, unspecified organism: Principal | ICD-10-CM

## 2021-03-21 DIAGNOSIS — R6521 Severe sepsis with septic shock: Secondary | ICD-10-CM

## 2021-03-21 DIAGNOSIS — R0602 Shortness of breath: Secondary | ICD-10-CM

## 2021-03-21 DIAGNOSIS — Z515 Encounter for palliative care: Secondary | ICD-10-CM

## 2021-03-21 DIAGNOSIS — Z7189 Other specified counseling: Secondary | ICD-10-CM

## 2021-03-21 DIAGNOSIS — J189 Pneumonia, unspecified organism: Secondary | ICD-10-CM

## 2021-03-21 DIAGNOSIS — E440 Moderate protein-calorie malnutrition: Secondary | ICD-10-CM | POA: Insufficient documentation

## 2021-03-21 LAB — CBC
HCT: 35.7 % — ABNORMAL LOW (ref 39.0–52.0)
Hemoglobin: 10.9 g/dL — ABNORMAL LOW (ref 13.0–17.0)
MCH: 30.7 pg (ref 26.0–34.0)
MCHC: 30.5 g/dL (ref 30.0–36.0)
MCV: 100.6 fL — ABNORMAL HIGH (ref 80.0–100.0)
Platelets: 256 10*3/uL (ref 150–400)
RBC: 3.55 MIL/uL — ABNORMAL LOW (ref 4.22–5.81)
RDW: 14.2 % (ref 11.5–15.5)
WBC: 17.7 10*3/uL — ABNORMAL HIGH (ref 4.0–10.5)
nRBC: 0.1 % (ref 0.0–0.2)

## 2021-03-21 LAB — BLOOD CULTURE ID PANEL (REFLEXED) - BCID2

## 2021-03-21 LAB — BASIC METABOLIC PANEL
Anion gap: 8 (ref 5–15)
BUN: 59 mg/dL — ABNORMAL HIGH (ref 8–23)
CO2: 25 mmol/L (ref 22–32)
Calcium: 8.6 mg/dL — ABNORMAL LOW (ref 8.9–10.3)
Chloride: 107 mmol/L (ref 98–111)
Creatinine, Ser: 1.92 mg/dL — ABNORMAL HIGH (ref 0.61–1.24)
GFR, Estimated: 37 mL/min — ABNORMAL LOW (ref >60)
Glucose, Bld: 223 mg/dL — ABNORMAL HIGH (ref 70–99)
Potassium: 4.5 mmol/L (ref 3.5–5.1)
Sodium: 140 mmol/L (ref 135–145)

## 2021-03-21 LAB — PHOSPHORUS: Phosphorus: 4.9 mg/dL — ABNORMAL HIGH (ref 2.5–4.6)

## 2021-03-21 LAB — GLUCOSE, CAPILLARY
Glucose-Capillary: 118 mg/dL — ABNORMAL HIGH (ref 70–99)
Glucose-Capillary: 189 mg/dL — ABNORMAL HIGH (ref 70–99)
Glucose-Capillary: 195 mg/dL — ABNORMAL HIGH (ref 70–99)
Glucose-Capillary: 195 mg/dL — ABNORMAL HIGH (ref 70–99)
Glucose-Capillary: 213 mg/dL — ABNORMAL HIGH (ref 70–99)
Glucose-Capillary: 230 mg/dL — ABNORMAL HIGH (ref 70–99)

## 2021-03-21 LAB — EXPECTORATED SPUTUM ASSESSMENT W GRAM STAIN, RFLX TO RESP C

## 2021-03-21 LAB — PROCALCITONIN: Procalcitonin: 0.61 ng/mL

## 2021-03-21 LAB — MAGNESIUM: Magnesium: 2.5 mg/dL — ABNORMAL HIGH (ref 1.7–2.4)

## 2021-03-21 MED ORDER — CHLORHEXIDINE GLUCONATE CLOTH 2 % EX PADS
6.0000 | MEDICATED_PAD | Freq: Every day | CUTANEOUS | Status: DC
  Administered 2021-03-21 – 2021-03-22 (×2): 6 via TOPICAL

## 2021-03-21 MED ORDER — ASPIRIN EC 81 MG PO TBEC
81.0000 mg | DELAYED_RELEASE_TABLET | Freq: Every day | ORAL | Status: DC
  Administered 2021-03-21 – 2021-03-22 (×2): 81 mg via ORAL
  Filled 2021-03-21 (×2): qty 1

## 2021-03-21 MED ORDER — LEVOFLOXACIN 500 MG PO TABS
250.0000 mg | ORAL_TABLET | Freq: Every day | ORAL | Status: DC
  Administered 2021-03-22: 250 mg via ORAL
  Filled 2021-03-21: qty 1

## 2021-03-21 MED ORDER — ATORVASTATIN CALCIUM 40 MG PO TABS
40.0000 mg | ORAL_TABLET | Freq: Every day | ORAL | Status: DC
  Administered 2021-03-21 – 2021-03-22 (×2): 40 mg via ORAL
  Filled 2021-03-21 (×2): qty 1

## 2021-03-21 MED ORDER — LEVOFLOXACIN 500 MG PO TABS
500.0000 mg | ORAL_TABLET | Freq: Once | ORAL | Status: AC
  Administered 2021-03-21: 500 mg via ORAL
  Filled 2021-03-21: qty 1

## 2021-03-21 MED ORDER — OXYCODONE HCL 5 MG/5ML PO SOLN
2.5000 mg | ORAL | Status: DC | PRN
  Administered 2021-03-22: 5 mg via ORAL
  Filled 2021-03-21: qty 5

## 2021-03-21 MED ORDER — INSULIN GLARGINE 100 UNIT/ML ~~LOC~~ SOLN
15.0000 [IU] | Freq: Every day | SUBCUTANEOUS | Status: DC
  Administered 2021-03-21 – 2021-03-22 (×2): 15 [IU] via SUBCUTANEOUS
  Filled 2021-03-21 (×2): qty 0.15

## 2021-03-21 NOTE — Progress Notes (Signed)
PHARMACY - PHYSICIAN COMMUNICATION CRITICAL VALUE ALERT - BLOOD CULTURE IDENTIFICATION (BCID)  [name] is an 70 y.o. male who presented to [facility] on [facility]/20/2021 with a chief complaint of SOB  Assessment:  1 of 4 bottles, anaerobic only, with Staph species, suspect contaminant   Name of physician (or Provider) Contacted: Dr Charlsie Quest  Current antibiotics: Rocephin & Azithromycin for CAP  Changes to prescribed antibiotics recommended:  No changes needed  Results for orders placed or performed during the hospital encounter of 03/20/21  Blood Culture ID Panel (Reflexed) (Collected: 03/20/2021 12:59 PM)  Result Value Ref Range   Enterococcus faecalis NOT DETECTED NOT DETECTED   Enterococcus Faecium NOT DETECTED NOT DETECTED   Listeria monocytogenes NOT DETECTED NOT DETECTED   Staphylococcus species DETECTED (A) NOT DETECTED   Staphylococcus aureus (BCID) NOT DETECTED NOT DETECTED   Staphylococcus epidermidis NOT DETECTED NOT DETECTED   Staphylococcus lugdunensis NOT DETECTED NOT DETECTED   Streptococcus species NOT DETECTED NOT DETECTED   Streptococcus agalactiae NOT DETECTED NOT DETECTED   Streptococcus pneumoniae NOT DETECTED NOT DETECTED   Streptococcus pyogenes NOT DETECTED NOT DETECTED   A.calcoaceticus-baumannii NOT DETECTED NOT DETECTED   Bacteroides fragilis NOT DETECTED NOT DETECTED   Enterobacterales NOT DETECTED NOT DETECTED   Enterobacter cloacae complex NOT DETECTED NOT DETECTED   Escherichia coli NOT DETECTED NOT DETECTED   Klebsiella aerogenes NOT DETECTED NOT DETECTED   Klebsiella oxytoca NOT DETECTED NOT DETECTED   Klebsiella pneumoniae NOT DETECTED NOT DETECTED   Proteus species NOT DETECTED NOT DETECTED   Salmonella species NOT DETECTED NOT DETECTED   Serratia marcescens NOT DETECTED NOT DETECTED   Haemophilus influenzae NOT DETECTED NOT DETECTED   Neisseria meningitidis NOT DETECTED NOT DETECTED   Pseudomonas aeruginosa NOT DETECTED NOT DETECTED    Stenotrophomonas maltophilia NOT DETECTED NOT DETECTED   Candida albicans NOT DETECTED NOT DETECTED   Candida auris NOT DETECTED NOT DETECTED   Candida glabrata NOT DETECTED NOT DETECTED   Candida krusei NOT DETECTED NOT DETECTED   Candida parapsilosis NOT DETECTED NOT DETECTED   Candida tropicalis NOT DETECTED NOT DETECTED   Cryptococcus neoformans/gattii NOT DETECTED NOT DETECTED    Eudelia Bunch, Pharm.D 03/21/2021 1:44 PM

## 2021-03-21 NOTE — Progress Notes (Signed)
NAME:  [name], MRN:  [id number], DOB:  January 15, 1951, LOS: 1 ADMISSION DATE:  03/20/2021, CONSULTATION DATE:  5.14 REFERRING MD:  Billy Fischer MD CHIEF COMPLAINT:  Shortness of breath with hypoxia (POA)   History of Present Illness:  This is a chronically ill-appearing 71 year old male patient with multiple comorbidities as mentioned below.  He lives with his wife, and at baseline for over the last year has had significant exertional dyspnea reporting shortness of breath even doing simple activities of daily living such as getting dressed.  He has had chronic vocal hoarseness as a consequence of radiation therapy to his vocal cords, as well as chronic cough with difficulty with mucus clearance.  Presented initially to his primary care provider with chief complaint of about 2-week history of decreased activity tolerance, worsening cough, poor appetite, and increased shortness of breath.  He had had no sick exposures.  He did have subjective fever and chills.  He noted over the course of the prior 2 weeks up to the time of presentation on 5/16 his shortness of breath had gotten worse,His sputum changed from clear to green color around 5/12 or 5/13 and because of this his wife insisted he see his doctor.  The office was closed on 5/13 so she called the first thing in the morning on 5/16 and was seen by his primary care provider.  In his 72 office his room air saturations were noted at 60%, he was placed on supplemental oxygen and titrated up to 4 L and still only saturated 81%.  He was given a short acting bronchodilator, intramuscular Depo-Medrol, and sent directly to the emergency room.  In the emergency room he he remained hypoxic until the addition of nonrebreather mask, he was hypotensive in route to the emergency room and given 1.5 L of crystalloid, remained hypotensive and was started on norepinephrine.  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, currently on observation Chronic vocal hoarseness Chronic left-sided rib pain with left-sided rib fractures felt a consequence of radiation therapy Chronic vocal hoarseness Chronic kidney disease stage Chronic obstructive pulmonary disease  Active tobacco abuse Coronary artery disease with prior drug-eluting stent to the RCA this is remote Obstructive sleep apnea on CPAP Type 2 diabetes on long-term insulin Hypertension Neurocardiac syncope Significant Hospital Events: Including procedures, antibiotic start and stop dates in addition to other pertinent events   5/16: Admitted with acute hypoxic respiratory failure in the setting of aspiration pneumonia versus community-acquired pneumonia (POA), with resultant septic shock (POA) and acute  on chronic renal failure (POA).  Placed on high flow oxygen via nonrebreather, systemic steroids initiated as well as scheduled bronchodilators.  Administered 30 mL/kg crystalloid intravenously for hypotension and started on peripheral norepinephrine.  Cultures sent, and started on azithromycin, ceftriaxone, and vancomycin.  Goals of care discussion with nurse practitioner, attending Dr., Patient and his wife present.  Patient desires DO NOT RESUSCITATE with no intubation should he arrest based on his chronic illnesses based on his advanced directives prior to admission.  He was admitted to the intensive care  5/17 stable pressors, improved O2  Interim History / Subjective:  No events  Sluggish Uop  Objective   Blood pressure (!) 118/42, pulse 80, temperature (!) 97.5 F (36.4 C),  temperature source Axillary, resp. rate 13, weight 64.1 kg, SpO2 94 %.        Intake/Output Summary (Last 24 hours) at 03/21/2021 0701 Last data filed at 03/21/2021 0500 Gross per 24 hour  Intake 3689.93 ml  Output 325 ml  Net 3364.93 ml   Filed Weights   03/21/21 0408  Weight: 64.1 kg    Examination: Constitutional: frail elderly man lying in bed  Eyes: EOMI, pupils equal Ears, nose, mouth, and throat: trachea midline, +temporal wasting Cardiovascular: RRR, ext warm Respiratory: rhonci and wheezing ongoing, lots of coughing fits with copious sputum production per overnight RN Gastrointestinal: soft, +BS Skin: No rashes, normal turgor Neurologic: moves all 4 ext, weak, raspy voice Psychiatric: flat affect, intermittent confusion   Labs/imaging that I havepersonally reviewed  (right click and "Reselect all SmartList Selections" daily)  BUN/Cr improved Sugars up a bit with steroids Stable CXR  Resolved Hospital Problem list     Assessment & Plan:  Acute hypoxic respiratory failure (POA) in the setting of right greater than left pneumonia, further complicated by acute exacerbation of chronic obstructive pulmonary disease, and ongoing tobacco abuse.  Favor aspiration given history of radiation related vocal cord injury, however community-acquired pneumonia not ruled out.  MRSA swab neg. - DC vanc - Continue ceftriaxone/azithromycin - Continue IV steroids another day - Continue nebs and CPT - f/u sputum culture  Septic shock (POA) secondary to above- persistence is concerning - Continue IVF - Hold home antihypertensives - Levophed titrated to MAP 65  Acute on chronic kidney disease (POA)- in setting of septic shock and dehydration - Continue IVF  Type 2 BM with hyperglycemia due to steroids - SSI for now, start low dose lantus (will need to watch as steroids are reduced)  Chronic anemia without evidence of bleeding - stable, monitor  History of coronary artery  disease with remote stent to RCA, history of hypertension, history of hyperlipidemia Plan Holding antihypertensives Continue Lipitor, asa Telemetry monitoring  History of non-small cell lung cancer initially involving right upper lobe requiring resection, then developed recurrence involving left lower lobe status post radiation, further complicated by right vocal cord squamous cell carcinoma treated with radiation All status post therapy and under surveillance without evidence of progression -Follow-up with oncology and ENT  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 - SLP consult appreciated - NPO pending this  History of obstructive sleep apnea with poor compliance with CPAP I am not sure given his cough mechanics CPAP is a safe treatment - Continue nocturnal pulse oximetry - Titrate oxygen  Refusing care, frail elderly- DNR, may be worthwhile to have palliative speak with patient and wife regarding long term GOC given weight loss and FTT  Best practice (right click and "Reselect all SmartList Selections" daily)  Diet:  NPO  Pain/Anxiety/Delirium protocol (if indicated): No VAP protocol (if indicated): Not indicated DVT prophylaxis: Subcutaneous Heparin GI prophylaxis: PPI Glucose control:  SSI Yes and Basal insulin Yes Central venous access:  N/A Arterial line:  N/A Foley:  N/A Mobility:  bed rest  PT consulted: N/A Last date of multidisciplinary goals of care discussion [in ER on admit] Code Status:  DNR Disposition: to ICU   Patient critically ill due to septic shock Interventions to address this today pressor titration Risk of deterioration without these interventions is high  I personally spent 32 minutes providing  critical care not including any separately billable procedures  Erskine Emery MD [facility]  Prefer epic messenger for cross cover needs If after hours, please call  E-link

## 2021-03-21 NOTE — Progress Notes (Signed)
PT refuses CPT at this time. RN aware.

## 2021-03-21 NOTE — Progress Notes (Signed)
TCT Triad MD who stated that [facility] ICU is covering patient tonight. TCT e-link and SW Technical brewer and advised her that pt is refusing all medical care at this moment, has removed his O2, refusing all medications, refusing assessment or to be touched. Pt states that "I am trying to die leave me alone!" Pt currently SpO2 at 72% on RA. CN is aware and asked RN to call e-link. Felicia RN states that she will let the MD know. Awaiting any orders.

## 2021-03-21 NOTE — TOC Initial Note (Signed)
Transition of Care [facility]) - Initial/Assessment Note    Patient Details  Name: [name] MRN: [id number] Date of Birth: [birth year]  Transition of Care [facility]) CM/SW Contact:    Leeroy Cha, RN Phone Number: 03/21/2021, 7:54 AM  Clinical Narrative:                 Significant Hospital Events: Including procedures, antibiotic start and stop dates in addition to other pertinent events   5/16: Admitted with acute hypoxic respiratory failure in the setting of aspiration pneumonia versus community-acquired pneumonia (POA), with resultant septic shock (POA) and acute  on chronic renal failure (POA).  Placed on high flow oxygen via nonrebreather, systemic steroids initiated as well as scheduled bronchodilators.  Administered 30 mL/kg crystalloid intravenously for hypotension and started on peripheral norepinephrine.  Cultures sent, and started on azithromycin, ceftriaxone, and vancomycin.  Goals of care discussion with nurse practitioner, attending Dr., Patient and his wife present.  Patient desires DO NOT RESUSCITATE with no intubation should he arrest based on his chronic illnesses based on his advanced directives prior to admission.  He was admitted to the intensive care  5/17 stable pressors, improved O2 Iv abxx2, poressor for bp ranging 81/24-118/42,hfnc at 10l/min Following for progression of care and toc needs, should be able to go home with self care. Expected Discharge Plan: Home/Self Care Barriers to Discharge: Continued Medical Work up   Patient Goals and CMS Choice Patient states their goals for this hospitalization and ongoing recovery are:: to go home CMS Medicare.gov Compare Post Acute Care list provided to:: Patient    Expected Discharge Plan and Services Expected Discharge Plan: Home/Self Care   Discharge Planning Services: CM Consult   Living arrangements for the past 2 months: Single Family Home                                      Prior Living  Arrangements/Services Living arrangements for the past 2 months: Single Family Home Lives with:: Spouse Patient language and need for interpreter reviewed:: Yes Do you feel safe going back to the place where you live?: Yes      Need for Family Participation in Patient Care: Yes (Comment) (wife) Care giver support system in place?: Yes (comment)   Criminal Activity/Legal Involvement Pertinent to Current Situation/Hospitalization: No - Comment as needed  Activities of Daily Living Home Assistive Devices/Equipment: Eyeglasses,Nebulizer ADL Screening (condition at time of admission) Patient's cognitive ability adequate to safely complete daily activities?: Yes Is the patient deaf or have difficulty hearing?: No Does the patient have difficulty seeing, even when wearing glasses/contacts?: Yes (trouble seeing the last 2 days) Does the patient have difficulty concentrating, remembering, or making decisions?: Yes (with low oxygen levels) Patient able to express need for assistance with ADLs?: Yes Does the patient have difficulty dressing or bathing?: No Independently performs ADLs?: Yes (appropriate for developmental age) Does the patient have difficulty walking or climbing stairs?: Yes Weakness of Legs: Both Weakness of Arms/Hands: Both  Permission Sought/Granted                  Emotional Assessment Appearance:: Appears stated age Attitude/Demeanor/Rapport: Engaged Affect (typically observed): Calm Orientation: : Oriented to Place,Oriented to Self,Oriented to  Time,Oriented to Situation Alcohol / Substance Use: Not Applicable Psych Involvement: No (comment)  Admission diagnosis:  SOB (shortness of breath) [R06.02] Septic shock ([place]) [A41.9, R65.21] Community acquired pneumonia, unspecified laterality [  J18.9] Patient Active Problem List   Diagnosis Date Noted  . Septic shock ([place]) 03/20/2021  . Acute renal failure superimposed on stage 3b chronic kidney disease ([place])   . DNR  no code (do not resuscitate)   . Rib fracture 08/05/2019  . Primary cancer of left upper lobe of lung ([place]) 01/13/2019  . 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 Chronic sinusitis 07/04/2017  . 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 Stage I squamous cell cancer of left lower lobe of lung ([place]) - 2017 08/09/2016  . CAD (coronary artery disease), native coronary artery 09/18/2015  . Orthostatic hypotension 07/20/2015  . Smoking 02/26/2015  . Neurocardiogenic syncope 05/26/2013  . Seizures ([place]) 05/03/2013  . Chronic kidney disease   . Community acquired pneumonia   . Type II diabetes mellitus ([place])   . OSA on CPAP 09/26/2012  . Neuropathic pain of both legs 04/23/2012  . S/P lobectomy of lung 04/04/2012  . Non-small cell carcinoma of lung, stage 1 ([place]) 03/17/2012    Class: Stage 1  . Chest pain, atypical 12/21/2011  . COPD exacerbation ([place]) 09/20/2011  . Acute respiratory failure with hypoxia ([place]) 09/20/2011  . Hypertension   . Depression   . GERD (gastroesophageal reflux disease)   . CIGARETTE SMOKER 09/25/2007  . DIZZINESS AND GIDDINESS 09/25/2007  . Chronic obstructive pulmonary disease ([place]) 08/22/2007   PCP:  [name], MD Pharmacy:   [id number] - [place], [place] [address] [place] [address] Phone: [phone] Fax: [phone]     Social Determinants of Health (SDOH) Interventions    Readmission Risk Interventions No flowsheet data found.

## 2021-03-21 NOTE — Progress Notes (Signed)
PT Cancellation Note  Patient Details Name: XZAVIAR MALOOF MRN: [id number] DOB: 1950-12-13   Cancelled Treatment:    Reason Eval/Treat Not Completed: Patient not medically ready, noted on levofed, will check back another time. Also noted to not want to be  Bothered, Palliative Medicine Consulted ,as well.    Claretha Cooper 03/21/2021, 7:50 AM  [place] Pager [phone] Office [phone]

## 2021-03-21 NOTE — Progress Notes (Signed)
eLink Physician-Brief Progress Note Patient Name: [name] DOB: August 22, 1951 MRN: [id number]   Date of Service  03/21/2021  HPI/Events of Note  Patient refusing care, he took his oxygen off, saturation  84 %.  eICU Interventions  I persuaded him to put his oxygen back on in return for a promise not to bother him anymore tonight, bedside RN has been instructed to minimize sleep interruptions and restrict interaction to essentials only.        Kerry Kass Christon Parada 03/21/2021, 9:55 PM

## 2021-03-21 NOTE — Progress Notes (Signed)
OT Cancellation Note  Patient Details Name: [name] MRN: [id number] DOB: 03/31/1951   Cancelled Treatment:    Reason Eval/Treat Not Completed: Medical issues which prohibited therapy. Per notes patient wanting to be DNR then became verbally aggressive/uncooperative with nursing. Will check back 5/18 as schedule permits.  [name] OT OT pager: [place] 03/21/2021, 6:44 AM

## 2021-03-21 NOTE — Progress Notes (Signed)
TCR from Dr. Lucile Shutters who asked to speak to pt in his room. Dr. Lucile Shutters requested pt to wear his O2 in exchange for minimal contact from staff tonight. Pt agreed to that and O2 was placed on pt. SpO2 went from 84% to 93%. He is lying on his right side and appears to be comfortable at this time. Will continue to monitor closely but will continue to not unnecessarily disturb pt's sleep. CN made aware.

## 2021-03-21 NOTE — Progress Notes (Signed)
Initial Nutrition Assessment  DOCUMENTATION CODES:   Non-severe (moderate) malnutrition in context of chronic illness  INTERVENTION:  - diet advancement as medically feasible. - will order appropriate oral nutrition supplements depending on results of swallow evaluation.   NUTRITION DIAGNOSIS:   Moderate Malnutrition related to chronic illness,cancer and cancer related treatments (COPD) as evidenced by moderate fat depletion,moderate muscle depletion,severe muscle depletion.  GOAL:   Patient will meet greater than or equal to 90% of their needs  MONITOR:   Diet advancement,Labs,Weight trends,I & O's  REASON FOR ASSESSMENT:   Malnutrition Screening Tool  ASSESSMENT:   70 year old male with medical history of stage 1 NSCLC s/p XRT resulting in persistent thick mucus and chronic vocal hoarseness, thyroid and parathyroid disease, HTN, CAD, MI, emphysema, COPD, chronic back pain, GERD, glaucoma, depression, insomnia, type 2 DM, arthritis, CKD, sleep apnea, and peripheral neuropathy. He has been experiencing exertional dyspnea and SOB with simple activities x1 year. He presented to the ED due to 2-week hx of decreased activity tolerance, worsening cough and decreased ability to clear secretions, increased SOB, and poor appetite. He was experiencing chills and subjective fever.  He has been NPO since admission; pending SLP evaluation. Noted to be a/o to self only. Patient laying in bed and in and out of sleep during visit. His wife is at bedside and provides all information.   Patient finished throat XRT x2 years ago. Since that time he has continued to have thick oral secretions/mucus that have become increasingly difficult for him to clear. He has no way of clearing them at home other than to cough them up.   For ~1 year he has had a decreased appetite and has required encouragement to eat. He is able to feed himself. Possibly partly d/t secretions, he often gets choked on foods and  beverages. Eating is laborious for him and for the past 3 weeks he has been eating very little, even when encouraged.  He was previously drinking oral nutrition supplements and enjoyed them, but has not consumed them for the past 1-1.5 years.   Over the past 3 weeks he has become increasingly weak and has had difficulty ambulating d/t this and d/t dizziness.   He had been losing weight throughout cancer treatment but had more recently been stable at ~172 lb. He last weighed this 3 weeks ago and has lost ~10 lb in the past 3 weeks, per wife.   Weight today documented as 141 lb, weight yesterday as 162 lb, and PTA the most recently documented weight was on 11/10/20 when he weighed 173 lb.   Patient discussed in rounds this AM. He is now DNR and Palliative Care has been consulted.    Labs reviewed; CBGs: 230, 195, 195 mg/dl, BUN: 59 mg/dl, creatinine: 1.92 mg/dl, Ca: 8.6 mg/dl, Phos: 4.9 mg/dl, Mg: 2.5 mg/dl, GFR: 37 ml/min. Medications reviewed; sliding scale novolog, 15 units lantus/day, 60 mg solu-medrol BID, 40 mg IV protonix/day.    NUTRITION - FOCUSED PHYSICAL EXAM:  Flowsheet Row Most Recent Value  Orbital Region Mild depletion  Upper Arm Region Moderate depletion  Thoracic and Lumbar Region Unable to assess  Buccal Region Moderate depletion  Temple Region Mild depletion  Clavicle Bone Region Moderate depletion  Clavicle and Acromion Bone Region Moderate depletion  Scapular Bone Region Unable to assess  Dorsal Hand Severe depletion  Patellar Region Severe depletion  Anterior Thigh Region Moderate depletion  Posterior Calf Region Mild depletion  Edema (RD Assessment) None  Hair Reviewed  Eyes  Reviewed  Mouth Reviewed  Skin Reviewed  Nails Reviewed       Diet Order:   Diet Order            Diet NPO time specified Except for: Sips with Meds  Diet effective now                 EDUCATION NEEDS:   Not appropriate for education at this time  Skin:  Skin Assessment:  Reviewed RN Assessment  Last BM:  PTA/unknown  Height:   Ht Readings from Last 1 Encounters:  03/20/21 5\' 11"  (1.803 m)    Weight:   Wt Readings from Last 1 Encounters:  03/21/21 64.1 kg     Estimated Nutritional Needs:  Kcal:  1925-2150 kcal Protein:  100-115 grams Fluid:  >/= 2 L/day     Jarome Matin, MS, RD, LDN, CNSC Inpatient Clinical Dietitian RD pager # available in [place]  After hours/weekend pager # available in [facility]

## 2021-03-21 NOTE — Progress Notes (Addendum)
Pt told RN that he did not want any ACLS measures (this RN explained the ACLS measures such as CPR, drugs, etc. to the pt) & would like to be DNR. When RN & e link MD attempted to talk to patient about code status & pt became aggressive verbally & uncooperative and just wanted to be "left alone." and insisted that I left his room. Pt refusing all care at this time.  RN will continue to monitor.

## 2021-03-21 NOTE — Progress Notes (Signed)
Pharmacy Antibiotic Note  [name] is a 70 y.o. male admitted on 03/20/2021 with septic shock with respiratory failure.  Pharmacy has been consulted for levaquin dosing for CAP with pseudomonas risk.  Plan: Levaquin 500mg  PO x 1, then 250mg  PO daily Follow up renal function & cultures  Weight: 64.1 kg (141 lb 5 oz)  Temp (24hrs), Avg:97.7 F (36.5 C), Min:97.4 F (36.3 C), Max:98.3 F (36.8 C)  Recent Labs  Lab 03/20/21 1248 03/20/21 1448 03/21/21 0245  WBC 12.8*  --  17.7*  CREATININE 2.45*  --  1.92*  LATICACIDVEN 1.1 1.0  --     Estimated Creatinine Clearance: 32 mL/min (A) (by C-G formula based on SCr of 1.92 mg/dL (H)).    Allergies  Allergen Reactions  . Actos [Pioglitazone] Swelling    EDEMA REACTION UNSPECIFIED  . Avandia [Rosiglitazone] Swelling    SWELLING REACTION UNSPECIFIED   . Dilaudid [Hydromorphone Hcl] Other (See Comments)    Made him crazy    Antimicrobials this admission: ceftriaxone 5/16>>5/17 azithromycin 5/16>>5/17 Vancomycin 5/16 >>5/17 5/17 Levaquin >>  Dose adjustments this admission:  Microbiology results: 5/16BCx: 1/4 anaerobic bottle GPC, staph species  5/16UCx: sent 5/16MRSA PCR: negative 5/17 sputum:  abundant sq cells; GPC, rare GNRs Thank you for allowing pharmacy to be a part of this patient's care.  Peggyann Juba, PharmD, BCPS Pharmacy: [phone] 03/21/2021 2:13 PM

## 2021-03-21 NOTE — Progress Notes (Signed)
I was called to bedside as pt wanted to leave AMA because he was still NPO status. I expressed to him my concern that stopping treatment could result in early and preventable death and perhaps un-needed suffering. He at first was resistant to this but after hearing we were OK with him having diet with the understanding that he may aspirate and accepting that risk he was much more open to hearing my proposed plan.  plan We are stopping the order for levophed We are allowing him to eat and drink.  Have changed abx to oral I have stopped the order for swallow eval as any restriction as a result of the eval would be unacceptable to him I spoke w/palliative. The patient understands that he will be meeting with .them to 1) establish a plan for symptom management 2) see if we can set up home hospice and 3) be sure we can manage his symptoms at home  He remains full DNR  He is no further escalation. Our plan at this point is to ensure we can manage his symptoms.   Erick Colace ACNP-BC [place] Pager # [phone] OR # [phone] if no answer

## 2021-03-22 ENCOUNTER — Telehealth: Payer: Self-pay | Admitting: *Deleted

## 2021-03-22 DIAGNOSIS — E440 Moderate protein-calorie malnutrition: Secondary | ICD-10-CM

## 2021-03-22 LAB — GLUCOSE, CAPILLARY
Glucose-Capillary: 129 mg/dL — ABNORMAL HIGH (ref 70–99)
Glucose-Capillary: 132 mg/dL — ABNORMAL HIGH (ref 70–99)

## 2021-03-22 MED ORDER — LOSARTAN POTASSIUM 50 MG PO TABS: 25.0000 mg | ORAL_TABLET | Freq: Every day | ORAL | Status: DC

## 2021-03-22 MED ORDER — DONEPEZIL HCL 10 MG PO TABS: 5.0000 mg | ORAL_TABLET | Freq: Every day | ORAL | Status: DC

## 2021-03-22 MED ORDER — LORAZEPAM 1 MG PO TABS: 1.0000 mg | ORAL_TABLET | ORAL | Status: DC | PRN

## 2021-03-22 MED ORDER — FLUOXETINE HCL 20 MG PO CAPS: 40.0000 mg | ORAL_CAPSULE | Freq: Every day | ORAL | Status: DC

## 2021-03-22 MED ORDER — TRAZODONE HCL 50 MG PO TABS: 200.0000 mg | ORAL_TABLET | Freq: Every evening | ORAL | Status: DC | PRN

## 2021-03-22 MED ORDER — ATORVASTATIN CALCIUM 40 MG PO TABS: 40.0000 mg | ORAL_TABLET | Freq: Every day | ORAL | Status: DC

## 2021-03-22 MED ORDER — ADULT MULTIVITAMIN W/MINERALS CH: 1.0000 | ORAL_TABLET | Freq: Every day | ORAL | Status: DC

## 2021-03-22 MED ORDER — OXYCODONE HCL 5 MG/5ML PO SOLN: 5.0000 mg | ORAL | 0 refills | Status: DC | PRN

## 2021-03-22 MED ORDER — HALOPERIDOL 1 MG PO TABS: 1.0000 mg | ORAL_TABLET | Freq: Four times a day (QID) | ORAL | Status: DC | PRN

## 2021-03-22 MED ORDER — OXYCODONE HCL 5 MG/5ML PO SOLN: 5.0000 mg | ORAL | Status: DC | PRN

## 2021-03-22 MED ORDER — LEVOFLOXACIN 250 MG PO TABS: 250.0000 mg | ORAL_TABLET | Freq: Every day | ORAL | 0 refills | Status: DC

## 2021-03-22 MED ORDER — GABAPENTIN 300 MG PO CAPS: 300.0000 mg | ORAL_CAPSULE | Freq: Three times a day (TID) | ORAL | Status: DC

## 2021-03-22 MED ORDER — PANTOPRAZOLE SODIUM 40 MG PO TBEC: 40.0000 mg | DELAYED_RELEASE_TABLET | Freq: Every day | ORAL | Status: DC

## 2021-03-22 MED ORDER — NITROGLYCERIN 0.4 MG SL SUBL: 0.4000 mg | SUBLINGUAL_TABLET | SUBLINGUAL | Status: DC | PRN

## 2021-03-22 MED ORDER — IPRATROPIUM-ALBUTEROL 0.5-2.5 (3) MG/3ML IN SOLN: 3.0000 mL | Freq: Four times a day (QID) | RESPIRATORY_TRACT | Status: DC | PRN

## 2021-03-22 MED ORDER — FLUTICASONE PROPIONATE 50 MCG/ACT NA SUSP
2.0000 | Freq: Every day | NASAL | Status: DC | PRN
  Filled 2021-03-22: qty 16

## 2021-03-22 MED ORDER — ORAL CARE MOUTH RINSE: 15.0000 mL | Freq: Two times a day (BID) | OROMUCOSAL | Status: DC

## 2021-03-22 MED ORDER — DILTIAZEM HCL ER COATED BEADS 120 MG PO CP24: 120.0000 mg | ORAL_CAPSULE | Freq: Every day | ORAL | Status: DC

## 2021-03-22 MED ORDER — ACETAMINOPHEN 500 MG PO TABS: 1000.0000 mg | ORAL_TABLET | Freq: Four times a day (QID) | ORAL | Status: DC | PRN

## 2021-03-22 NOTE — Progress Notes (Signed)
OT Cancellation Note  Patient Details Name: [name] MRN: [id number] DOB: Aug 11, 1951   Cancelled Treatment:    Reason Eval/Treat Not Completed: OT screened, no needs identified, will sign off. Per chart review patient has been refusing medical care and quoted saying "I am trying to die, leave me alone." Will sign off at this time, if new needs arise/patient wanting to work with therapy please re-consult. Thank you.  [name] OT OT pager: [place] 03/22/2021, 6:18 AM

## 2021-03-22 NOTE — Discharge Summary (Signed)
Physician Discharge Summary  Patient ID: [name] MRN: [id number] DOB/AGE: 1951-01-13 70 y.o.  Admit date: 03/20/2021 Discharge date: 03/22/2021  Admission Diagnoses: Acute exacerbation of chronic obstructive pulmonary disease Pneumonia Acute hypoxemic respiratory failure Septic shock secondary to pneumonia Acute on chronic kidney disease-stage IIIb kidney disease type 2 diabetes Chronic anemia History of obstructive sleep apnea  Discharge Diagnoses:  Active Problems:   Acute respiratory failure with hypoxia (HCC)   Community acquired pneumonia   Septic shock ([place])   Malnutrition of moderate degree   Discharged Condition: fair  Hospital Course: Patient was admitted with acute hypoxemic respiratory failure secondary to community-acquired pneumonia.  Sepsis at admission requiring peripheral pressors. He was sick for about 2 weeks with subjective fever and chills, cough and bringing up secretions.  He did follow-up with his primary care doctor-treated in the office and sent to the emergency department where he was admitted He was stabilized on antibiotics, pressors were weaned after 24 hours He was on antibiotics, IV steroids, bronchodilators He continues to stabilize however in the last 48 hours, started insisting about wanting to go home He states he just wants to go home and rest, he knows he is going to die.  Declining interventions   Consults: Palliative care medicine  Significant Diagnostic Studies: labs: Chest x-ray with multifocal infiltrates Chemistry within normal limits with elevated BUN/creatinine at baseline, leukocytosis stable  Treatments: antibiotics: Levaquin, steroids  Discharge Exam: Blood pressure (!) 121/49, pulse 74, temperature 97.7 F (36.5 C), temperature source Oral, resp. rate (!) 21, weight 64.1 kg, SpO2 93 %. Elderly, does not appear to be in distress Moist oral mucosa Clear breath sounds S1-S2 appreciated Bowel sounds appreciated No  edema, no clubbing  Disposition: Discharge disposition: 01-Home or Self Care       Discharge Instructions    Diet - low sodium heart healthy   Complete by: As directed    Diet general   Complete by: As directed    Increase activity slowly   Complete by: As directed    Increase activity slowly   Complete by: As directed      Allergies as of 03/22/2021      Reactions   Actos [pioglitazone] Swelling   EDEMA REACTION UNSPECIFIED   Avandia [rosiglitazone] Swelling   SWELLING REACTION UNSPECIFIED    Dilaudid [hydromorphone Hcl] Other (See Comments)   Made him crazy      Medication List    TAKE these medications   acetaminophen 500 MG tablet Commonly known as: TYLENOL Take 1,000 mg by mouth every 6 (six) hours as needed for moderate pain.   atorvastatin 40 MG tablet Commonly known as: LIPITOR Take 40 mg by mouth daily.   Breztri Aerosphere 160-9-4.8 MCG/ACT Aero Generic drug: Budeson-Glycopyrrol-Formoterol Inhale 2 puffs into the lungs in the morning and at bedtime.   diltiazem 120 MG 24 hr capsule Commonly known as: CARDIZEM CD Take 1 capsule (120 mg total) by mouth daily.   donepezil 5 MG tablet Commonly known as: ARICEPT TAKE 1 TABLET BY MOUTH EVERYDAY AT BEDTIME   FLUoxetine 40 MG capsule Commonly known as: PROZAC TAKE 1 CAPSULE BY MOUTH EVERY DAY What changed: how much to take   fluticasone 50 MCG/ACT nasal spray Commonly known as: FLONASE Place 2 sprays into both nostrils daily as needed for allergies.   gabapentin 300 MG capsule Commonly known as: NEURONTIN TAKE 1 CAPSULE BY MOUTH THREE TIMES A DAY What changed:   how much to take  how to take  this  when to take this  additional instructions   ipratropium-albuterol 0.5-2.5 (3) MG/3ML Soln Commonly known as: DUONEB Take 3 mLs by nebulization every 6 (six) hours as needed. What changed: reasons to take this   levofloxacin 250 MG tablet Commonly known as: LEVAQUIN Take 1 tablet (250 mg  total) by mouth daily. Start taking on: Mar 23, 2021   losartan 25 MG tablet Commonly known as: COZAAR Take 25 mg by mouth daily.   multivitamin with minerals Tabs tablet Take 1 tablet by mouth daily.   nitroGLYCERIN 0.4 MG SL tablet Commonly known as: NITROSTAT Place 0.4 mg under the tongue every 5 (five) minutes as needed for chest pain.   ondansetron 4 MG tablet Commonly known as: Zofran Take 1 tablet (4 mg total) by mouth every 8 (eight) hours as needed for nausea or vomiting.   ONE TOUCH ULTRA TEST test strip Generic drug: glucose blood   OneTouch Ultra test strip Generic drug: glucose blood TEST ONCE DAILY E11.42   oxyCODONE 5 MG/5ML solution Commonly known as: ROXICODONE Take 5-10 mLs (5-10 mg total) by mouth every 3 (three) hours as needed (shortness of breath).   traZODone 100 MG tablet Commonly known as: DESYREL Take 2 tablets (200 mg total) by mouth at bedtime as needed for sleep.       Follow-up Information    [name], MD Follow up in 1 week(s).   Specialty: Family Medicine Contact information: [address] [place] [address] [phone]               Signed: Shaleta Ruacho A Emad Brechtel 03/22/2021, 4:06 PM

## 2021-03-22 NOTE — Progress Notes (Signed)

## 2021-03-22 NOTE — Consult Note (Signed)
Consultation Note Date: 03/22/2021   Patient Name: [name]  DOB: [birth year]  MRN: [id number]  Age / Sex: 70 y.o., male  PCP: [name], MD Referring Physician: Candee Furbish, MD  Reason for Consultation: Establishing goals of care and Non pain symptom management  HPI/Patient Profile: 70 y.o. 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  He has discussed with critical care and wants to work to focus on quality of life and is open to hospice support.  Palliative consulted for goals of care.  Clinical Assessment and Goals of Care: I met today with [name] in his room.  He was lying in bed in no distress.  Staff reports that he was angry earlier today, but he was very engaging during my encounter.  They just ordered a pizza for him and he was very much looking forward to having something to eat.  I introduced palliative care as specialized medical care for people living with serious illness. It focuses on providing relief from the symptoms and stress of a serious illness. The goal is to improve quality of life for both the patient and the family.  He tells me that they have been doing a good job explaining things to him and he understands that he has chronic aspiration and is not going to go away.  He feels that "something is going to kill me" and he wants to focus on feeling as well as he can but not artificially prolonging his life.  He states "I will go when it is my time."  We discussed clinical course as well as wishes moving forward in regard to advanced directives and care plan.  Concepts specific to code status and rehospitalization discussed.  We discussed difference between  a aggressive medical intervention path and a palliative, comfort focused care path.  Values and goals of care important to patient and family were attempted to be elicited.  Concept of Hospice and Palliative Care were discussed.  He tells me that his brother had hospice support at home and they did a good job controlling symptoms and "helping him get out of here with a little dignity."  He is open to hospice support on discharge.  I then called and reached his wife, [name].  [name] tells me that she was able to speak with Marni Griffon and has a good understanding of his situation.  I talked with her about conversation today regarding potential for home with hospice.  She is familiar with hospice support and she feels that if he is agreeable this would be a good avenue to pursue.  She previously worked in Insurance underwriter and is familiar with [facility] would like to work with them moving forward.  Questions and concerns addressed.   PMT will continue to support holistically.   SUMMARY OF RECOMMENDATIONS   -Overall, Mr.  Pogorzelski understands he is critically ill and wants to work to be out of the hospital.  He tells me he is not interested in interventions to extend the quantity of his life but would like to do anything possible to improve his quality.  Eating is very important to him and this is something we are going to continue despite aspiration risk.  He understands this and is adamant that he wants to eat moving forward. -He feels short of breath around-the-clock and would likely benefit from low-dose opioids to help with this.  He tells me he has difficulty getting prescriptions for pain medications outside of the hospital.  We discussed that with hospice support, they would be able to support him with appropriately prescribed opioids as needed. -He would like to work to discharge home with hospice support.  I will place referral to care management to help facilitate.  I discussed with his wife used to  work for [employer].  She would like to work with Manufacturing engineer. -Discussed above plan with patient, bedside care team, and his wife.  We will follow-up tomorrow regarding symptom management.  Code Status/Advance Care Planning:  DNR     Symptom Management:   Shortness of breath: Addition of low-dose oxycodone as needed to see if this improves symptoms of shortness of breath.  Palliative Prophylaxis:   Aspiration and Frequent Pain Assessment  Additional Recommendations (Limitations, Scope, Preferences):  Avoid Hospitalization  Psycho-social/Spiritual:   Desire for further Chaplaincy support:no  Additional Recommendations: Education on Hospice  Prognosis:   < 6 months if his disease follows its natural course and he should qualify for home hospice on discharge as desired  Discharge Planning: Home with Hospice      Primary Diagnoses: Present on Admission: . Septic shock ([place])   I have reviewed the medical record, interviewed the patient and family, and examined the patient. The following aspects are pertinent.  Past Medical History:  Diagnosis Date  . Allergy   . Anemia   . Anginal pain ([place])   . Arthritis    "hands"  . Asthma   . Back pain    4 deteriorating disc and receives an injection q3-10mo;has been doing this for about 467yr . Blood transfusion    as a child  . Bronchitis   . Cancer (HCPaxvilledx'd 2013   Non-small cell lung cancer  . Chronic kidney disease    acute kidney failure post surgery  . COPD (chronic obstructive pulmonary disease) (HCC)    uses Albuterol and Spiriva daily  . Coronary artery disease    has 1 stent  . Depression    takes Prozac daily  . Emphysema    sees Dr.Ramaswami for this  . Family history of adverse reaction to anesthesia    mother was hard to wake up sometimes  . GERD (gastroesophageal reflux disease)    takes Prilosec daily, EGD nml 06/2017  . Glaucoma    hx of  . H/O hiatal hernia   .  Hypertension    takes  HYzaar daily  . Insomnia    takes Trazodone nightly  . Lung mass    right upper lobe  . Myocardial infarct ([facility]  . 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 Obesity   . 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 Periodic limb movements of sleep   . Peripheral neuropathy   . Pneumonia    hx of' 70 yo,rd' last time in  2013  . Productive cough    white in color but no odor  . Shortness of breath    with exertion   . Sleep apnea    uses BiPaP; "no longer have sleep apnea since I have lost over 100 lbs"  . Syncope and collapse 05/26/2013  . Thyroid disease   . Type II diabetes mellitus (HCC)    takes Metformin bid and Novolog and Lantus daily   Social History   Socioeconomic History  . Marital status: Married    Spouse name: mary lou  . Number of children: 3  . Years of education: trade   . Highest education level: Not on file  Occupational History  . Occupation: truck Geophysicist/field seismologist    Comment: retired  Tobacco Use  . Smoking status: Current Every Day Smoker    Packs/day: 3.00    Years: 54.00    Pack years: 162.00    Types: Cigarettes    Start date: 1963  . Smokeless tobacco: Never Used  . Tobacco comment: 1ppd as of 01/04/21  ep  Vaping Use  . Vaping Use: Former  Substance and Sexual Activity  . Alcohol use: No    Comment: no alcolol since 2013  . Drug use: Yes    Types: Cocaine, Marijuana    Comment: quit 1990's  . Sexual activity: Not Currently  Other Topics Concern  . 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   Social Determinants of Health   Financial Resource Strain: Low Risk   . Difficulty of Paying Living Expenses: Not very hard  Food Insecurity: Not on file  Transportation Needs: Not on file  Physical Activity:  Not on file  Stress: Not on file  Social Connections: Not on file   Family History  Problem Relation Age of Onset  . COPD Mother   . Diabetes Mother   . Hypertension Mother   . Heart attack Father   . Diabetes Father   . Anesthesia problems Neg Hx   . Hypotension Neg Hx   . Malignant hyperthermia Neg Hx   . Pseudochol deficiency Neg Hx    Scheduled Meds: . arformoterol  15 mcg Nebulization BID  . aspirin EC  81 mg Oral Daily  . atorvastatin  40 mg Oral Daily  . budesonide (PULMICORT) nebulizer solution  0.25 mg Nebulization BID  . Chlorhexidine Gluconate Cloth  6 each Topical Daily  . FLUoxetine  40 mg Oral Daily  . fluticasone  2 spray Each Nare Daily  . heparin  5,000 Units Subcutaneous Q8H  . insulin aspart  0-15 Units Subcutaneous Q4H  . insulin glargine  15 Units Subcutaneous Daily  . levofloxacin  250 mg Oral Daily  . methylPREDNISolone (SOLU-MEDROL) injection  60 mg Intravenous Q12H  . pantoprazole  40 mg Oral QHS  . revefenacin  175 mcg Nebulization Daily   Continuous Infusions: . sodium chloride Stopped (03/20/21 1921)  . sodium chloride Stopped (03/20/21 1921)   PRN Meds:.acetaminophen, docusate sodium, ondansetron (ZOFRAN) IV, oxyCODONE, polyethylene glycol Medications Prior to Admission:  Prior to Admission medications   Medication Sig Start Date End Date Taking? Authorizing Provider  acetaminophen (TYLENOL) 500 MG tablet Take 1,000 mg by mouth  every 6 (six) hours as needed for moderate pain.   Yes [provider]  atorvastatin (LIPITOR) 40 MG tablet Take 40 mg by mouth daily.   Yes [provider]  Budeson-Glycopyrrol-Formoterol (BREZTRI AEROSPHERE) 160-9-4.8 MCG/ACT AERO Inhale 2 puffs into the lungs in the morning and at bedtime. 01/04/21  Yes Brand Males, MD  diltiazem (CARDIZEM CD) 120 MG 24 hr capsule Take 1 capsule (120 mg total) by mouth daily. 12/26/20 12/21/21 Yes Belva Crome, MD  FLUoxetine (PROZAC) 40 MG capsule TAKE 1 CAPSULE  BY MOUTH EVERY DAY Patient taking differently: Take 40 mg by mouth daily. 11/09/20  Yes [name], MD  fluticasone (FLONASE) 50 MCG/ACT nasal spray Place 2 sprays into both nostrils daily as needed for allergies. 06/06/17  Yes [provider]  gabapentin (NEURONTIN) 300 MG capsule TAKE 1 CAPSULE BY MOUTH THREE TIMES A DAY Patient taking differently: Take 300 mg by mouth 3 (three) times daily. 09/13/20  Yes [name], MD  ipratropium-albuterol (DUONEB) 0.5-2.5 (3) MG/3ML SOLN Take 3 mLs by nebulization every 6 (six) hours as needed. Patient taking differently: Take 3 mLs by nebulization every 6 (six) hours as needed (shortness of breath or wheezing). 01/04/21  Yes Brand Males, MD  losartan (COZAAR) 25 MG tablet Take 25 mg by mouth daily.   Yes [provider]  Multiple Vitamin (MULTIVITAMIN WITH MINERALS) TABS Take 1 tablet by mouth daily.   Yes [provider]  nitroGLYCERIN (NITROSTAT) 0.4 MG SL tablet Place 0.4 mg under the tongue every 5 (five) minutes as needed for chest pain.   Yes [provider]  traZODone (DESYREL) 100 MG tablet Take 2 tablets (200 mg total) by mouth at bedtime as needed for sleep. 12/01/20  Yes [name], MD  donepezil (ARICEPT) 5 MG tablet TAKE 1 TABLET BY MOUTH EVERYDAY AT BEDTIME Patient not taking: No sig reported 11/09/19   [name], MD  glucose blood (ONETOUCH ULTRA) test strip TEST ONCE DAILY E11.42 11/17/19   [provider]  ondansetron (ZOFRAN) 4 MG tablet Take 1 tablet (4 mg total) by mouth every 8 (eight) hours as needed for nausea or vomiting. Patient not taking: No sig reported 03/23/20   Alycia Rossetti, MD  ONE TOUCH ULTRA TEST test strip  11/20/18   [provider]  albuterol (VENTOLIN HFA) 108 (90 Base) MCG/ACT inhaler TAKE 2 PUFFS BY MOUTH EVERY 6 HOURS AS NEEDED FOR WHEEZE OR SHORTNESS OF BREATH 04/14/19 10/09/19  [provider]   Allergies  Allergen Reactions  .  Actos [Pioglitazone] Swelling    EDEMA REACTION UNSPECIFIED  . Avandia [Rosiglitazone] Swelling    SWELLING REACTION UNSPECIFIED   . Dilaudid [Hydromorphone Hcl] Other (See Comments)    Made him crazy   Review of Systems  Constitutional: Positive for activity change, appetite change and fatigue.  Respiratory: Positive for cough, choking, shortness of breath and wheezing.   Musculoskeletal: Positive for back pain.  Psychiatric/Behavioral: Positive for sleep disturbance.    Physical Exam  General: Alert, awake, in no acute distress. Frail and chronically ill-appearing. HEENT: No bruits, no goiter, no JVD Heart: Regular rate and rhythm. No murmur appreciated. Lungs: Copius secretions, rhonchi and wheezing Abdomen: Soft, nontender, nondistended, positive bowel sounds.  Ext: No significant edema Skin: Warm and dry Neuro: Grossly intact, nonfocal.  Raspy voice  Vital Signs: BP (!) 116/49   Pulse 84   Temp 97.8 F (36.6 C) (Oral)   Resp (!) 25  Wt 64.1 kg   SpO2 95%   BMI 19.71 kg/m  Pain Scale: 0-10   Pain Score: Asleep   SpO2: SpO2: 95 % O2 Device:SpO2: 95 % O2 Flow Rate: .O2 Flow Rate (L/min): 6 L/min  IO: Intake/output summary:   Intake/Output Summary (Last 24 hours) at 03/22/2021 0748 Last data filed at 03/22/2021 0600 Gross per 24 hour  Intake 322.83 ml  Output 1300 ml  Net -977.17 ml    LBM: Last BM Date:  (PTA) Baseline Weight: Weight: 64.1 kg Most recent weight: Weight:  (Refused)     Palliative Assessment/Data:     Time In: 1530 Time Out: 1655 Time Total: 85 Greater than 50%  of this time was spent counseling and coordinating care related to the above assessment and plan.  Signed by: Micheline Rough, MD   Please contact Palliative Medicine Team phone at [phone] for questions and concerns.  For individual provider: See Shea Evans

## 2021-03-22 NOTE — Progress Notes (Addendum)
Manufacturing engineer [facility]) Hospital Liaison: RN note    Notified by Transition of Care Manger Velva Harman, RN of patient/family request for [facility] services at home after discharge. Chart and patient information under review by [facility] physician. Hospice eligibility confirmed.    Writer spoke with patient at bedside and wife by phone to initiate education related to hospice philosophy, services and team approach to care. Patient and wife verbalized understanding of information given.  Please send signed and completed DNR form home with patient/family. Patient will need prescriptions for discharge comfort medications.     DME needs have been discussed, patient currently has the following equipment in the home: none.  Patient/family requests the following DME for delivery to the home: oxygen and tank to be delivered to hospital room.  [employer] equipment manager has been notified and will contact DME provider to arrange delivery to the home. Home address has been verified and is correct in the chart.  Stasia Cavalier  is the family member to contact to arrange time of delivery.     [facility] aware of the above. Please notify [facility] when patient is ready to leave the unit at discharge. (Call [phone] or [phone] after 5pm.) [facility] information and contact numbers given to patient.      A Please do not hesitate to call with questions.    Thank you,   Farrel Gordon, RN, [place] (listed on [facility] under [place])    [phone]

## 2021-03-22 NOTE — Progress Notes (Signed)
Daily Progress Note   Patient Name: [name]       Date: 03/22/2021 DOB: [birth year]  Age: 70 y.o. MRN#: [id number] Attending Physician: Laurin Coder, MD Primary Care Physician: [name], MD Admit Date: 03/20/2021  Reason for Consultation/Follow-up: Establishing goals of care  Subjective: I saw and examined [name] today.  He reports that low-dose oxycodone was helpful for shortness of breath.  He feels the dose was effective but did not make him sleepy.  We discussed his goal of being at home and we talked about plan for discharge home with hospice once equipment can be delivered.  We reviewed care plan and medications for comfort.  Overall, he reports feeling good about plan to be at home with the support of hospice.  Length of Stay: 2  Current Medications: Scheduled Meds:  . arformoterol  15 mcg Nebulization BID  . atorvastatin  40 mg Oral Daily  . budesonide (PULMICORT) nebulizer solution  0.25 mg Nebulization BID  . diltiazem  120 mg Oral Daily  . donepezil  5 mg Oral QHS  . [START ON 03/23/2021] FLUoxetine  40 mg Oral Daily  . gabapentin  300 mg Oral TID  . levofloxacin  250 mg Oral Daily  . losartan  25 mg Oral Daily  . mouth rinse  15 mL Mouth Rinse BID  . methylPREDNISolone (SOLU-MEDROL) injection  60 mg Intravenous Q12H  . [START ON 03/23/2021] multivitamin with minerals  1 tablet Oral Q lunch  . revefenacin  175 mcg Nebulization Daily    Continuous Infusions: . sodium chloride Stopped (03/20/21 1921)  . sodium chloride Stopped (03/20/21 1921)    PRN Meds: acetaminophen, fluticasone, haloperidol, ipratropium-albuterol, LORazepam, nitroGLYCERIN, ondansetron (ZOFRAN) IV, oxyCODONE, traZODone  Physical Exam         General: Alert, awake, in  no acute distress. Frail and chronically ill-appearing HEENT: No bruits, no goiter, no JVD Heart: Regular rate and rhythm. No murmur appreciated. Lungs: Secretions noted.  Rhonchi and wheezing Abdomen: Soft, nontender, nondistended, positive bowel sounds.  Ext: No significant edema Skin: Warm and dry Neuro: Grossly intact, nonfocal.  Raspy voice   Vital Signs: BP (!) 121/49 (BP Location: Right Arm)   Pulse 74   Temp 98 F (36.7 C) (Axillary)   Resp (!) 21   Wt 64.1 kg  SpO2 93%   BMI 19.71 kg/m  SpO2: SpO2: 93 % O2 Device: O2 Device: Nasal Cannula O2 Flow Rate: O2 Flow Rate (L/min): 3 L/min  Intake/output summary:   Intake/Output Summary (Last 24 hours) at 03/22/2021 1648 Last data filed at 03/22/2021 1602 Gross per 24 hour  Intake 240 ml  Output 1675 ml  Net -1435 ml   LBM: Last BM Date:  (PTA) Baseline Weight: Weight: 64.1 kg Most recent weight: Weight:  (Refused)       Palliative Assessment/Data:      Patient Active Problem List   Diagnosis Date Noted  . Malnutrition of moderate degree 03/21/2021  . Septic shock ([place]) 03/20/2021  . Acute renal failure superimposed on stage 3b chronic kidney disease ([place])   . DNR no code (do not resuscitate)   . Rib fracture 08/05/2019  . Primary cancer of left upper lobe of lung ([place]) 01/13/2019  . 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 Chronic sinusitis 07/04/2017  . Primary cancer of glottis ([place])   . 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 CAD (coronary artery disease), native coronary artery 09/18/2015  . Orthostatic hypotension 07/20/2015  . Smoking 02/26/2015  . Neurocardiogenic syncope 05/26/2013  . Seizures ([place]) 05/03/2013  . Chronic kidney disease   . Community acquired pneumonia   . Type II diabetes mellitus ([place])   . OSA on CPAP 09/26/2012  . Neuropathic pain of both legs 04/23/2012  . S/P lobectomy of lung 04/04/2012  . Non-small cell carcinoma of  lung, stage 1 ([place]) 03/17/2012  . Chest pain, atypical 12/21/2011  . COPD exacerbation ([place]) 09/20/2011  . Acute respiratory failure with hypoxia ([place]) 09/20/2011  . Hypertension   . Depression   . GERD (gastroesophageal reflux disease)   . CIGARETTE SMOKER 09/25/2007  . DIZZINESS AND GIDDINESS 09/25/2007  . Chronic obstructive pulmonary disease ([place]) 08/22/2007    Palliative Care Assessment & Plan   Patient Profile: 70 y.o. 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  He has discussed with critical care and wants to work to focus on quality of life and is open to hospice support.  Palliative consulted for goals of care.   Recommendations/Plan:  Plan for home with hospice support  For symptoms on discharge recommend:  Oxycodone 5 mg as needed for pain or shortness of breath  Ativan 1 mg as needed for anxiety  Haldol 1 mg as needed for nausea or agitation  Discussed that further medication management and titration would be through hospice.  He understands and denies any other needs or questions at this time.  Goals of Care and Additional Recommendations:  Limitations on Scope of Treatment: Full Comfort Care  Code Status:    Code Status Orders  (From admission, onward)         Start     Ordered   03/20/21 2144  Do not attempt resuscitation (DNR)  Continuous       Question Answer Comment  In the event of cardiac or respiratory ARREST Do not call a "code blue"   In the event of cardiac or respiratory ARREST Do not perform Intubation, CPR, defibrillation or ACLS   In the event of cardiac or respiratory ARREST Use medication by any route, position, wound  care, and other measures to relive pain and suffering. May use oxygen, suction and manual treatment of  airway obstruction as needed for comfort.      03/20/21 2143        Code Status History    Date Active Date Inactive Code Status Order ID Comments User Context   03/20/2021 1632 03/20/2021 2143 Partial Code [id number]  Candee Furbish, MD ED   03/20/2021 1628 03/20/2021 1632 Full Code [id number]  Candee Furbish, MD ED   08/30/2013 1646 09/02/2013 1933 Full Code [id number]  Kelvin Cellar, MD Inpatient   12/15/2012 1856 12/16/2012 1423 Full Code [id number]  Blossom Hoops, RN Inpatient   12/12/2012 2011 12/15/2012 1856 DNR [id number]  Purvis Kilts, RN Inpatient   09/26/2012 1037 09/29/2012 2103 Full Code [id number]  Nechama Guard, RN Inpatient   04/23/2012 1307 04/25/2012 1413 Full Code [id number]  Jinny Sanders, RN Inpatient   03/17/2012 1518 04/04/2012 2212 Full Code [id number]  Tiajuana Amass, RN Inpatient   09/20/2011 1458 09/22/2011 1700 Full Code [id number]  Jerald Kief, RN Inpatient   Advance Care Planning Activity    Advance Directive Documentation   Flowsheet Row Most Recent Value  Type of Advance Directive Healthcare Power of Attorney, Living will  Pre-existing out of facility DNR order (yellow form or pink MOST form) --  "MOST" Form in Place? --       Prognosis:   < 6 months  Discharge Planning:  Home with Hospice  Care plan was discussed with patient  Thank you for allowing the Palliative Medicine Team to assist in the care of this patient.   Time In: 1515 Time Out: 1540 Total Time 25 Prolonged Time Billed No      Greater than 50%  of this time was spent counseling and coordinating care related to the above assessment and plan.  Micheline Rough, MD  Please contact Palliative Medicine Team phone at [phone] for questions and concerns.

## 2021-03-22 NOTE — Progress Notes (Signed)
NAME:  [name], MRN:  [id number], DOB:  1951/09/09, LOS: 2 ADMISSION DATE:  03/20/2021, CONSULTATION DATE:  5.14 REFERRING MD:  Billy Fischer MD CHIEF COMPLAINT:  Shortness of breath with hypoxia (POA)   History of Present Illness:  This is a chronically ill-appearing 70 year old male patient with multiple comorbidities as mentioned below.  He lives with his wife, and at baseline for over the last year has had significant exertional dyspnea reporting shortness of breath even doing simple activities of daily living such as getting dressed.  He has had chronic vocal hoarseness as a consequence of radiation therapy to his vocal cords, as well as chronic cough with difficulty with mucus clearance.  Presented initially to his primary care provider with chief complaint of about 2-week history of decreased activity tolerance, worsening cough, poor appetite, and increased shortness of breath.  He had had no sick exposures.  He did have subjective fever and chills.  He noted over the course of the prior 2 weeks up to the time of presentation on 5/16 his shortness of breath had gotten worse,His sputum changed from clear to green color around 5/12 or 5/13 and because of this his wife insisted he see his doctor.  The office was closed on 5/13 so she called the first thing in the morning on 5/16 and was seen by his primary care provider.  In his 70 office his room air saturations were noted at 60%, he was placed on supplemental oxygen and titrated up to 4 L and still only saturated 81%.  He was given a short acting bronchodilator, intramuscular Depo-Medrol, and sent directly to the emergency room.  In the emergency room he he remained hypoxic until the addition of nonrebreather mask, he was hypotensive in route to the emergency room and given 1.5 L of crystalloid, remained hypotensive and was started on norepinephrine.  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, currently on observation Chronic vocal hoarseness Chronic left-sided rib pain with left-sided rib fractures felt a consequence of radiation therapy Chronic vocal hoarseness Chronic kidney disease stage Chronic obstructive pulmonary disease  Active tobacco abuse Coronary artery disease with prior drug-eluting stent to the RCA this is remote Obstructive sleep apnea on CPAP Type 2 diabetes on long-term insulin Hypertension Neurocardiac syncope Significant Hospital Events: Including procedures, antibiotic start and stop dates in addition to other pertinent events   5/16: Admitted with acute hypoxic respiratory failure in the setting of aspiration pneumonia versus community-acquired pneumonia (POA), with resultant septic shock (POA) and acute  on chronic renal failure (POA).  Placed on high flow oxygen via nonrebreather, systemic steroids initiated as well as scheduled bronchodilators.  Administered 30 mL/kg crystalloid intravenously for hypotension and started on peripheral norepinephrine.  Cultures sent, and started on azithromycin, ceftriaxone, and vancomycin.  Goals of care discussion with nurse practitioner, attending Dr., Patient and his wife present.  Patient desires DO NOT RESUSCITATE with no intubation should he arrest based on his chronic illnesses based on his advanced directives prior to admission.  He was admitted to the intensive care  5/17 stable pressors, improved O2  Interim History / Subjective:  Refusing meds,  interventions (chest PT), asking for monitor to be turned off.  States he just wants to be able to eat and  to go home with hospice. TOC placed 5/17, awaiting update.  Will plan to call them later this morning.  Objective   Blood pressure (!) 116/49, pulse 84, temperature 97.8 F (36.6 C), temperature source Oral, resp. rate (!) 25, weight 64.1 kg, SpO2 95 %.        Intake/Output Summary (Last 24 hours) at 03/22/2021 0854 Last data filed at 03/22/2021 0600 Gross per 24 hour  Intake 322.83 ml  Output 1300 ml  Net -977.17 ml   Filed Weights   03/21/21 0408  Weight: 64.1 kg    Examination: General: Adult male, resting in bed, in NAD. Neuro: A&O x 3, MAE's. HEENT: [place]/AT. Sclerae anicteric. EOMI. Cardiovascular: RRR, no M/R/G.  Lungs: Respirations even and unlabored. Rhonchi bilaterally. Abdomen: BS x 4, soft, NT/ND.  Musculoskeletal: No gross deformities, no edema.  Skin: Intact, warm, no rashes.    Labs/imaging that I havepersonally reviewed  (right click and "Reselect all SmartList Selections" daily)  BUN/Cr improved Sugars up a bit with steroids Stable CXR  Resolved Hospital Problem list   Septic Shock  Assessment & Plan:   Acute hypoxic respiratory failure (POA) in the setting of right greater than left pneumonia presumed 2/2 aspiration, further complicated by acute exacerbation of chronic obstructive pulmonary disease, and ongoing tobacco abuse. History of obstructive sleep apnea with poor compliance with CPAP. Acute on chronic kidney disease (POA). Type 2 BM with hyperglycemia due to steroids. History of coronary artery disease with remote stent to RCA, history of hypertension, history of hyperlipidemia. 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 All status post therapy and under surveillance without evidence of progression. 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 Refusing care, frail elderly.  Discussion: After discussion with pt 5/17 and visit with palliative care, it is clear that his goals are to be able to eat and to go home with hospice.  He understands the risks of further aspiration involved with regular diet and accepts these.  Furthermore, he is now refusing meds, interventions such as chest PT, nursing repositioning, etc and is asking for the monitor to be quiet.  TOC consult was placed 5/17, awaiting recs still.  I will plan to call them later this morning to follow up on placement.  Plan: - Continue full DNR. - Continue supplemental O2 as he will tolerate for comfort. - Continue Levaquin, BD's, steroids if he will. - Continue low dose Oxycodone for symptom management of dyspnea / air hunger. - D/c all other meds, interventions, labs, etc. - F/u with TOC regarding placement for home hospice.  Transfer to med surg while awaiting home hospice placement.  Will f/u with TOC.  If can be placed today or tomorrow, will keep on PCCM service.  If there will be delays, will ask [facility] to assume care in AM 5/19 with PCCM off.   Montey Hora, [facility] For pager details, please see AMION or use Epic chat  After 1900, please call [place] for cross coverage needs 03/22/2021, 9:04 AM

## 2021-03-22 NOTE — Progress Notes (Signed)
Pt refusing services of care. Charge nurse aware. MD aware. BP was taken. Pt stable in bed at this time.

## 2021-03-22 NOTE — TOC Progression Note (Signed)
Transition of Care [facility]) - Progression Note    Patient Details  Name: [name] MRN: [id number] Date of Birth: April 27, 1951  Transition [facility] CM/SW Contact  Leeroy Cha, RN Phone Number: 03/22/2021, 10:40 AM  Clinical Narrative:    [name] with hospice to please come and see patient for home hospice set up.   Expected Discharge Plan: Home/Self Care Barriers to Discharge: Continued Medical Work up  Expected Discharge Plan and Services Expected Discharge Plan: Home/Self Care   Discharge Planning Services: CM Consult   Living arrangements for the past 2 months: Single Family Home                                       Social Determinants of Health (SDOH) Interventions    Readmission Risk Interventions No flowsheet data found.

## 2021-03-22 NOTE — Telephone Encounter (Signed)
Received call from [place] from [facility].   Reports that patient is to be discharged from hospital with hospice services. Reports that patient is waiting on oxygen delivery prior to discharge.  Admitting DX respiratory failure 2/2 non-small cell lung cancer, squamous cell cancer status post lobectomy in 2013, recovering cancer, vocal cord lesion with invasive squamous cell cancer chronic kidney disease, COPD   Inquired if MD will remain primary attending. VO given for PCP to remain primary attending with hospice MD to cover emergent pain needs.   MD to be made aware.

## 2021-03-22 NOTE — Discharge Instructions (Signed)
Continue oxygen supplementation  Continue bronchodilators

## 2021-03-23 LAB — CULTURE, BLOOD (ROUTINE X 2): Special Requests: ADEQUATE

## 2021-03-23 LAB — CULTURE, RESPIRATORY W GRAM STAIN: Culture: NORMAL

## 2021-03-24 LAB — URINE CULTURE: Culture: 1000 — AB

## 2021-03-26 LAB — CULTURE, BLOOD (ROUTINE X 2): Culture: NO GROWTH

## 2021-03-30 ENCOUNTER — Other Ambulatory Visit: Payer: Self-pay

## 2021-03-30 ENCOUNTER — Encounter: Payer: Self-pay | Admitting: Family Medicine

## 2021-03-30 ENCOUNTER — Ambulatory Visit (INDEPENDENT_AMBULATORY_CARE_PROVIDER_SITE_OTHER): Admitting: Family Medicine

## 2021-03-30 VITALS — BP 98/52 | HR 102 | Temp 97.9°F | Resp 16 | Ht 71.0 in | Wt 152.0 lb

## 2021-03-30 DIAGNOSIS — J189 Pneumonia, unspecified organism: Secondary | ICD-10-CM

## 2021-03-30 LAB — BASIC METABOLIC PANEL WITH GFR
BUN/Creatinine Ratio: 14 (calc) (ref 6–22)
BUN: 20 mg/dL (ref 7–25)
CO2: 33 mmol/L — ABNORMAL HIGH (ref 20–32)
Calcium: 8.7 mg/dL (ref 8.6–10.3)
Chloride: 100 mmol/L (ref 98–110)
Creat: 1.4 mg/dL — ABNORMAL HIGH (ref 0.70–1.18)
GFR, Est African American: 58 mL/min/{1.73_m2} — ABNORMAL LOW (ref >=60)
GFR, Est Non African American: 50 mL/min/{1.73_m2} — ABNORMAL LOW (ref >=60)
Glucose, Bld: 148 mg/dL — ABNORMAL HIGH (ref 65–99)
Potassium: 4.8 mmol/L (ref 3.5–5.3)
Sodium: 142 mmol/L (ref 135–146)

## 2021-03-30 LAB — CBC WITH DIFFERENTIAL/PLATELET
Absolute Monocytes: 812 cells/uL (ref 200–950)
Basophils Absolute: 23 cells/uL (ref 0–200)
Basophils Relative: 0.2 %
Eosinophils Absolute: 151 cells/uL (ref 15–500)
Eosinophils Relative: 1.3 %
HCT: 36.3 % — ABNORMAL LOW (ref 38.5–50.0)
Hemoglobin: 11.6 g/dL — ABNORMAL LOW (ref 13.2–17.1)
Lymphs Abs: 1636 cells/uL (ref 850–3900)
MCH: 30.4 pg (ref 27.0–33.0)
MCHC: 32 g/dL (ref 32.0–36.0)
MCV: 95 fL (ref 80.0–100.0)
MPV: 11 fL (ref 7.5–12.5)
Monocytes Relative: 7 %
Neutro Abs: 8978 cells/uL — ABNORMAL HIGH (ref 1500–7800)
Neutrophils Relative %: 77.4 %
Platelets: 294 10*3/uL (ref 140–400)
RBC: 3.82 10*6/uL — ABNORMAL LOW (ref 4.20–5.80)
RDW: 12.4 % (ref 11.0–15.0)
Total Lymphocyte: 14.1 %
WBC: 11.6 10*3/uL — ABNORMAL HIGH (ref 3.8–10.8)

## 2021-03-30 MED ORDER — LEVOFLOXACIN 250 MG PO TABS: 250.0000 mg | ORAL_TABLET | Freq: Every day | ORAL | 0 refills | Status: DC

## 2021-03-30 NOTE — Progress Notes (Signed)
Subjective:    Patient ID: [name], male    DOB: [birth year], 70 y.o.   MRN: [id number]  HPI I sent the patient to the hospital at his last visit due to hypoxemic respiratory failure.  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  Sepsis at admission requiring peripheral pressors. He was sick for about 2 weeks with subjective fever and chills, cough and bringing up secretions.  He did follow-up with his primary care doctor-treated in the office and sent to the emergency department where he was admitted He was stabilized on antibiotics, pressors were weaned after 24 hours He was on antibiotics, IV steroids, bronchodilators He continues to stabilize however in the last 48 hours, started insisting about wanting to go home He states he just wants to go home and rest, he knows he is going to die.  Declining interventions   Consults: Palliative care medicine  Significant Diagnostic Studies: labs: Chest x-ray with multifocal infiltrates Chemistry within normal limits with elevated BUN/creatinine at baseline, leukocytosis stable  Treatments: antibiotics: Levaquin, steroids  Patient is here today for follow-up.  Chest x-ray obtained in the hospital showed multifocal pneumonia most pronounced in the right lower lobe.   However he looks much better than the last time I saw him.  He is resting comfortably in the exam room on 4 L.  He is not struggling to breathe.  He denies any chest pain.  He is not having to use any bronchodilators at home.  He is also not on steroids.  He took his last dose of Levaquin yesterday.  Hospice is involved however the patient has rallied and appears back to his baseline essentially aside from being on 2 additional liters of oxygen.  Wife states that he is not eating well or drinking well.  Blood pressure is still very low.  He is taking diltiazem for heart rate control at home as well as losartan for blood pressure control. Past Medical History:  Diagnosis Date  . Allergy   . Anemia   . Anginal pain ([place])   . Arthritis    "hands"  . Asthma   . Back pain    4 deteriorating disc and receives an injection q3-73mon;has been doing this for about 17yrs  . Blood transfusion    as a child  . Bronchitis   . Cancer ([place]) dx'd 2013   Non-small cell lung cancer  . Chronic kidney disease    acute kidney failure post surgery  . COPD (chronic obstructive pulmonary disease) (HCC)    uses Albuterol and Spiriva daily  . Coronary artery disease    has 1 stent  . Depression    takes Prozac daily  . Emphysema    sees Dr.Ramaswami for this  . Family history of adverse reaction to anesthesia    mother was hard to wake up sometimes  . GERD (gastroesophageal reflux disease)    takes Prilosec daily, EGD nml 06/2017  .  Glaucoma    hx of  . H/O hiatal hernia   . Hypertension    takes  HYzaar daily  . Insomnia    takes Trazodone nightly  . Lung mass    right upper lobe  . Myocardial infarct ([place])   . 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 Obesity   . 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 Periodic limb movements of sleep   . Peripheral neuropathy   . Pneumonia    hx of' 70 yo,rd' last time in 2013  . Productive cough    white in color but no odor  . Shortness of  breath    with exertion   . Sleep apnea    uses BiPaP; "no longer have sleep apnea since I have lost over 100 lbs"  . Syncope and collapse 05/26/2013  . Thyroid disease   . Type II diabetes mellitus (HCC)    takes Metformin bid and Novolog and Lantus daily   Past Surgical History:  Procedure Laterality Date  . CARDIAC CATHETERIZATION  2006/2008/2009  . CARPAL TUNNEL RELEASE     bilateral  . COLONOSCOPY    . CORONARY ANGIOPLASTY WITH STENT PLACEMENT     1 stent  . ELBOW SURGERY     ulnar nerve; left  . ESOPHAGOGASTRODUODENOSCOPY    . EYE SURGERY     pt. denies  . FUDUCIAL PLACEMENT Left 08/24/2016   Procedure: PLACEMENT OF FUDUCIAL;  Surgeon: Melrose Nakayama, MD;  Location: [facility];  Service: Thoracic;  Laterality: Left;  . INGUINAL HERNIA REPAIR  1990's   double inguinal  . LOBECTOMY  03/17/2012   upper right side with resection  . MICROLARYNGOSCOPY WITH CO2 LASER AND EXCISION OF VOCAL CORD LESION N/A 06/28/2017   Procedure: MICROLARYNGOSCOPY WITH BIOPSY;  Surgeon: Melida Quitter, MD;  Location: [facility];  Service: ENT;  Laterality: N/A;  . MICROLARYNGOSCOPY WITH CO2 LASER AND EXCISION OF VOCAL CORD LESION N/A 07/19/2017   Procedure: MICROLARYNGOSCOPY WITH CO2 LASER AND EXCISION OF VOCAL CORD LESION;  Surgeon: Melida Quitter, MD;  Location: [facility];  Service: ENT;  Laterality: N/A;  MICRO DIRECT LARYNGOSCOPY WITH CO2 LASER VOCAL CORD STRIPPING/POSS JET VENTURI VENTILATION  . REFRACTIVE SURGERY     bilaterally  . ROTATOR CUFF REPAIR     left  . VIDEO BRONCHOSCOPY WITH ENDOBRONCHIAL NAVIGATION N/A 08/24/2016   Procedure: VIDEO BRONCHOSCOPY WITH ENDOBRONCHIAL NAVIGATION;  Surgeon: Melrose Nakayama, MD;  Location: [facility];  Service: Thoracic;  Laterality: N/A;   Current Outpatient Medications on File Prior to Visit  Medication Sig Dispense Refill  . acetaminophen (TYLENOL) 500 MG tablet Take 1,000 mg by mouth every 6 (six) hours as needed for moderate pain.    Marland Kitchen atorvastatin (LIPITOR)  40 MG tablet Take 40 mg by mouth daily.    Marland Kitchen diltiazem (CARDIZEM CD) 120 MG 24 hr capsule Take 1 capsule (120 mg total) by mouth daily. 90 capsule 3  . FLUoxetine (PROZAC) 40 MG capsule TAKE 1 CAPSULE BY MOUTH EVERY DAY (Patient taking differently: Take 40 mg by mouth daily.) 90 capsule 1  . gabapentin (NEURONTIN) 300 MG capsule TAKE 1 CAPSULE BY MOUTH THREE TIMES A DAY (Patient taking differently: Take 300 mg by mouth 3 (three) times daily.) 270 capsule 2  . glucose blood (ONETOUCH ULTRA) test strip TEST ONCE DAILY E11.42    . ipratropium-albuterol (DUONEB) 0.5-2.5 (3) MG/3ML SOLN Take 3 mLs by nebulization every 6 (six) hours as  needed. (Patient taking differently: Take 3 mLs by nebulization every 6 (six) hours as needed (shortness of breath or wheezing).) 360 mL 11  . losartan (COZAAR) 25 MG tablet Take 25 mg by mouth daily.    . nitroGLYCERIN (NITROSTAT) 0.4 MG SL tablet Place 0.4 mg under the tongue every 5 (five) minutes as needed for chest pain.    . ONE TOUCH ULTRA TEST test strip     . oxyCODONE (ROXICODONE) 5 MG/5ML solution Take 5-10 mLs (5-10 mg total) by mouth every 3 (three) hours as needed (shortness of breath). 473 mL 0  . traZODone (DESYREL) 100 MG tablet Take 2 tablets (200 mg total) by mouth at bedtime as needed for sleep. (Patient not taking: Reported on 03/30/2021) 180 tablet 3  . [DISCONTINUED] albuterol (VENTOLIN HFA) 108 (90 Base) MCG/ACT inhaler TAKE 2 PUFFS BY MOUTH EVERY 6 HOURS AS NEEDED FOR WHEEZE OR SHORTNESS OF BREATH     Current Facility-Administered Medications on File Prior to Visit  Medication Dose Route Frequency Provider Last Rate Last Admin  . albuterol (PROVENTIL) (2.5 MG/3ML) 0.083% nebulizer solution 2.5 mg  2.5 mg Nebulization Once Delsa Grana, PA-C       Allergies  Allergen Reactions  . Actos [Pioglitazone] Swelling    EDEMA REACTION UNSPECIFIED  . Avandia [Rosiglitazone] Swelling    SWELLING REACTION UNSPECIFIED   . Dilaudid [Hydromorphone Hcl] Other  (See Comments)    Made him crazy   Social History   Socioeconomic History  . Marital status: Married    Spouse name: mary lou  . Number of children: 3  . Years of education: trade   . Highest education level: Not on file  Occupational History  . Occupation: truck Geophysicist/field seismologist    Comment: retired  Tobacco Use  . Smoking status: Current Every Day Smoker    Packs/day: 3.00    Years: 54.00    Pack years: 162.00    Types: Cigarettes    Start date: 1963  . Smokeless tobacco: Never Used  . Tobacco comment: 1ppd as of 01/04/21  ep  Vaping Use  . Vaping Use: Former  Substance and Sexual Activity  . Alcohol use: No    Comment: no alcolol since 2013  . Drug use: Yes    Types: Cocaine, Marijuana    Comment: quit 1990's  . Sexual activity: Not Currently  Other Topics Concern  . 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   Social Determinants of Health   Financial Resource Strain: Low Risk   . Difficulty of Paying Living Expenses: Not very hard  Food Insecurity: Not on file  Transportation Needs: Not on file  Physical Activity: Not on file  Stress: Not on file  Social Connections: Not on file  Intimate Partner Violence: Not on file      Review of Systems  All other systems reviewed and are negative.      Objective:   Physical Exam Vitals reviewed.  Constitutional:      General: He is not in acute distress.    Appearance: He is ill-appearing. He is not toxic-appearing.  Cardiovascular:     Rate and Rhythm: Regular rhythm. Tachycardia present.     Heart  sounds: Normal heart sounds.  Pulmonary:     Effort: No tachypnea or respiratory distress.     Breath sounds: Decreased air movement present. Decreased breath sounds present. No wheezing, rhonchi or rales.   Musculoskeletal:     Right lower leg: No edema.     Left lower leg: No edema.  Neurological:     Mental Status: He is alert.           Assessment & Plan:   Pneumonia of right lower lobe due to infectious organism - Plan: CBC with Differential/Platelet, BASIC METABOLIC PANEL WITH GFR  Breath sounds have improved dramatically from the last time I saw the patient.  I have asked to add an additional 3 days of Levaquin to complete 10 days total.  However patient has responded dramatically to antibiotics.  I have asked the patient to try to drink more fluids to get his blood pressure up.  I recommended Gatorade.  I have also asked the patient to discontinue losartan due to hypotension.  Check CBC and BMP.  Reassess next week or sooner if worsening.

## 2021-04-04 ENCOUNTER — Other Ambulatory Visit: Payer: Self-pay | Admitting: Family Medicine

## 2021-04-04 MED ORDER — OXYCODONE HCL 5 MG/5ML PO SOLN: 5.0000 mg | ORAL | 0 refills | Status: DC | PRN

## 2021-04-04 NOTE — Telephone Encounter (Signed)
Received call from Natasha Bence with [facility] to request refill of oxyCODONE (ROXICODONE) 5 MG/5ML solution [[id number]]    Pharmacy confirmed as  [id number] [place], [place]  [address] [name] [address]  Phone:  [phone] Fax:  [phone]  DEA #:  [id number]  Please advise Aaron Edelman at [phone] with any questions. Patient can be reached at [phone].

## 2021-04-07 ENCOUNTER — Other Ambulatory Visit: Payer: Self-pay

## 2021-04-07 ENCOUNTER — Ambulatory Visit (INDEPENDENT_AMBULATORY_CARE_PROVIDER_SITE_OTHER): Payer: PPO | Admitting: Family Medicine

## 2021-04-07 ENCOUNTER — Ambulatory Visit: Payer: Self-pay | Admitting: Family Medicine

## 2021-04-07 ENCOUNTER — Encounter: Payer: Self-pay | Admitting: Family Medicine

## 2021-04-07 VITALS — BP 118/62 | HR 88 | Temp 98.7°F | Resp 18 | Ht 71.0 in | Wt 153.0 lb

## 2021-04-07 DIAGNOSIS — J189 Pneumonia, unspecified organism: Secondary | ICD-10-CM | POA: Diagnosis not present

## 2021-04-07 DIAGNOSIS — D649 Anemia, unspecified: Secondary | ICD-10-CM | POA: Diagnosis not present

## 2021-04-07 NOTE — Progress Notes (Signed)
Subjective:    Patient ID: [name], male    DOB: July 06, 1951, 70 y.o.   MRN: [id number]  HPI I sent the patient to the hospital at his last visit due to hypoxemic respiratory failure.  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  Sepsis at admission requiring peripheral pressors. He was sick for about 2 weeks with subjective fever and chills, cough and bringing up secretions.  He did follow-up with his primary care doctor-treated in the office and sent to the emergency department where he was admitted He was stabilized on antibiotics, pressors were weaned after 24 hours He was on antibiotics, IV steroids, bronchodilators He continues to stabilize however in the last 48 hours, started insisting about wanting to go home He states he just wants to go home and rest, he knows he is going to die.  Declining interventions   Consults: Palliative care medicine  Significant Diagnostic Studies: labs: Chest x-ray with multifocal infiltrates Chemistry within normal limits with elevated BUN/creatinine at baseline, leukocytosis stable  Treatments: antibiotics: Levaquin, steroids  Patient is here today for follow-up.  Chest x-ray obtained in the hospital showed multifocal pneumonia most pronounced in the right lower lobe.   However he looks much better than the last time I saw him.  He is resting comfortably in the exam room on 4 L.  He is not struggling to breathe.  He denies any chest pain.  He is not having to use any bronchodilators at home.  He is also not on steroids.  He took his last dose of Levaquin yesterday.  Hospice is involved however the patient has rallied and appears back to his baseline essentially aside from being on 2 additional liters of oxygen.  Wife states that he is not eating well or drinking well.  Blood pressure is still very low.  He is taking diltiazem for heart rate control at home as well as losartan for blood pressure control.  AT that time, my plan was: Breath sounds have improved dramatically from the last time I saw the patient.  I have asked to add an additional 3 days of Levaquin to complete 10 days total.  However patient has responded dramatically to antibiotics.  I have asked the patient to try to drink more fluids to get his blood pressure up.  I recommended Gatorade.  I have also asked the patient to discontinue losartan due to hypotension.  Check CBC and BMP.  Reassess next week or sooner if worsening.  04/07/21 No visits with results within 1 Week(s) from this visit.  Latest known visit with results is:  Office Visit on 03/30/2021  Component Date Value Ref Range Status  . WBC 03/30/2021 11.6* 3.8 - 10.8 Thousand/uL Final  . RBC 03/30/2021 3.82* 4.20 - 5.80 Million/uL Final  . Hemoglobin 03/30/2021 11.6* 13.2 - 17.1 g/dL Final  . HCT 03/30/2021 36.3* 38.5 - 50.0 % Final  . MCV 03/30/2021 95.0  80.0 -  100.0 fL Final  . MCH 03/30/2021 30.4  27.0 - 33.0 pg Final  . MCHC 03/30/2021 32.0  32.0 - 36.0 g/dL Final  . RDW 03/30/2021 12.4  11.0 - 15.0 % Final  . Platelets 03/30/2021 294  140 - 400 Thousand/uL Final  . MPV 03/30/2021 11.0  7.5 - 12.5 fL Final  . Neutro Abs 03/30/2021 8,978* 1,500 - 7,800 cells/uL Final  . Lymphs Abs 03/30/2021 1,636  850 - 3,900 cells/uL Final  .  Absolute Monocytes 03/30/2021 812  200 - 950 cells/uL Final  . Eosinophils Absolute 03/30/2021 151  15 - 500 cells/uL Final  . Basophils Absolute 03/30/2021 23  0 - 200 cells/uL Final  . Neutrophils Relative % 03/30/2021 77.4  % Final  . Total Lymphocyte 03/30/2021 14.1  % Final  . Monocytes Relative 03/30/2021 7.0  % Final  . Eosinophils Relative 03/30/2021 1.3  % Final  . Basophils Relative 03/30/2021 0.2  % Final  . Glucose, Bld 03/30/2021 148* 65 - 99 mg/dL Final   Comment: .            Fasting reference interval . For someone without known diabetes, a glucose value >125 mg/dL indicates that they may have diabetes and this should be confirmed with a follow-up test. .   . BUN 03/30/2021 20  7 - 25 mg/dL Final  . Creat 03/30/2021 1.40* 0.70 - 1.18 mg/dL Final   Comment: For patients >67 years of age, the reference limit for Creatinine is approximately 13% higher for people identified as African-American. .   . GFR, Est Non African American 03/30/2021 50* > OR = 60 mL/min/1.68m2 Final  . GFR, Est African American 03/30/2021 58* > OR = 60 mL/min/1.21m2 Final  . BUN/Creatinine Ratio 03/30/2021 14  6 - 22 (calc) Final  . Sodium 03/30/2021 142  135 - 146 mmol/L Final  . Potassium 03/30/2021 4.8  3.5 - 5.3 mmol/L Final  . Chloride 03/30/2021 100  98 - 110 mmol/L Final  . CO2 03/30/2021 33* 20 - 32 mmol/L Final  . Calcium 03/30/2021 8.7  8.6 - 10.3 mg/dL Final   Recent lab work showed improvement in his renal function back to his baseline of 1.4.  White blood cell count had improved from 17 down to 11.  However hemoglobin had dropped from his baseline of 13 approximately a year ago to just over 11.  We had a discussion regarding this today.  The patient is too frail to tolerate an EGD or colonoscopy.  Therefore we have decided not to check his stool for blood.  They are interested in checking his B12 and iron levels in case this could be a nutritional deficiency in an effort to try to  improve his hemoglobin and improve his quality of life.  He states that his breathing is at his baseline.  However he is wheezing more today on exam than he was last time.  He also has diminished breath sounds throughout.  He denies any fevers or chills.  He has not been using his nebulizers at all.  He stated that he just started back on Breztri.  Past Medical History:  Diagnosis Date  . Allergy   . Anemia   . Anginal pain ([place])   . Arthritis    "hands"  . Asthma   . Back pain    4 deteriorating disc and receives an injection q3-38mon;has been doing this for about 38yrs  . Blood transfusion    as a child  .  Bronchitis   . Cancer ([place]) dx'd 2013   Non-small cell lung cancer  . Chronic kidney disease    acute kidney failure post surgery  . COPD (chronic obstructive pulmonary disease) (HCC)    uses Albuterol and Spiriva daily  . Coronary artery disease    has 1 stent  . Depression    takes Prozac daily  . Emphysema    sees Dr.Ramaswami for this  . Family history of adverse reaction to anesthesia    mother was hard to wake up sometimes  . GERD (gastroesophageal reflux disease)    takes Prilosec daily, EGD nml 06/2017  . Glaucoma    hx of  . H/O hiatal hernia   . Hypertension    takes  HYzaar daily  . Insomnia    takes Trazodone nightly  . Lung mass    right upper lobe  . Myocardial infarct ([place])   . 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 Obesity   . 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 Periodic limb movements of sleep   . Peripheral neuropathy   . Pneumonia    hx of' 70 yo,rd' last time in 2013  . Productive cough    white in color but no odor  . Shortness of breath    with exertion   . Sleep apnea    uses BiPaP; "no longer have sleep apnea since I have lost over 100 lbs"  . Syncope and collapse 05/26/2013  . Thyroid disease   . Type II diabetes mellitus (HCC)    takes Metformin bid and Novolog and Lantus daily   Past Surgical History:   Procedure Laterality Date  . CARDIAC CATHETERIZATION  2006/2008/2009  . CARPAL TUNNEL RELEASE     bilateral  . COLONOSCOPY    . CORONARY ANGIOPLASTY WITH STENT PLACEMENT     1 stent  . ELBOW SURGERY     ulnar nerve; left  . ESOPHAGOGASTRODUODENOSCOPY    . EYE SURGERY     pt. denies  . FUDUCIAL PLACEMENT Left 08/24/2016   Procedure: PLACEMENT OF FUDUCIAL;  Surgeon: Melrose Nakayama, MD;  Location: [facility];  Service: Thoracic;  Laterality: Left;  . INGUINAL HERNIA REPAIR  1990's   double inguinal  . LOBECTOMY  03/17/2012   upper right side with resection  . MICROLARYNGOSCOPY WITH CO2 LASER AND EXCISION OF VOCAL CORD LESION N/A 06/28/2017   Procedure: MICROLARYNGOSCOPY WITH BIOPSY;  Surgeon: Melida Quitter, MD;  Location: [facility];  Service: ENT;  Laterality: N/A;  . MICROLARYNGOSCOPY WITH CO2 LASER AND EXCISION OF VOCAL CORD LESION N/A 07/19/2017   Procedure: MICROLARYNGOSCOPY WITH CO2 LASER AND EXCISION OF VOCAL CORD LESION;  Surgeon: Melida Quitter, MD;  Location: [facility];  Service: ENT;  Laterality: N/A;  MICRO DIRECT LARYNGOSCOPY WITH CO2 LASER VOCAL CORD STRIPPING/POSS JET VENTURI VENTILATION  . REFRACTIVE SURGERY     bilaterally  . ROTATOR CUFF REPAIR     left  . VIDEO BRONCHOSCOPY WITH ENDOBRONCHIAL NAVIGATION N/A 08/24/2016   Procedure: VIDEO BRONCHOSCOPY WITH ENDOBRONCHIAL NAVIGATION;  Surgeon: Melrose Nakayama, MD;  Location: [facility];  Service: Thoracic;  Laterality: N/A;   Current Outpatient Medications on File Prior to Visit  Medication Sig Dispense Refill  . acetaminophen (TYLENOL) 500 MG tablet Take 1,000 mg by mouth every 6 (six) hours as needed for moderate pain.    Marland Kitchen atorvastatin (LIPITOR) 40 MG tablet Take 40 mg by mouth daily.    Marland Kitchen diltiazem (  CARDIZEM CD) 120 MG 24 hr capsule Take 1 capsule (120 mg total) by mouth daily. 90 capsule 3  . FLUoxetine (PROZAC) 40 MG capsule TAKE 1 CAPSULE BY MOUTH EVERY DAY (Patient taking differently: Take 40 mg by mouth daily.) 90 capsule  1  . gabapentin (NEURONTIN) 300 MG capsule TAKE 1 CAPSULE BY MOUTH THREE TIMES A DAY (Patient taking differently: Take 300 mg by mouth 3 (three) times daily.) 270 capsule 2  . glucose blood (ONETOUCH ULTRA) test strip TEST ONCE DAILY E11.42    . ipratropium-albuterol (DUONEB) 0.5-2.5 (3) MG/3ML SOLN Take 3 mLs by nebulization every 6 (six) hours as needed. (Patient taking differently: Take 3 mLs by nebulization every 6 (six) hours as needed (shortness of breath or wheezing).) 360 mL 11  . nitroGLYCERIN (NITROSTAT) 0.4 MG SL tablet Place 0.4 mg under the tongue every 5 (five) minutes as needed for chest pain.    Marland Kitchen oxyCODONE (ROXICODONE) 5 MG/5ML solution Take 5-10 mLs (5-10 mg total) by mouth every 3 (three) hours as needed (shortness of breath). 473 mL 0  . traZODone (DESYREL) 100 MG tablet Take 2 tablets (200 mg total) by mouth at bedtime as needed for sleep. 180 tablet 3  . [DISCONTINUED] albuterol (VENTOLIN HFA) 108 (90 Base) MCG/ACT inhaler TAKE 2 PUFFS BY MOUTH EVERY 6 HOURS AS NEEDED FOR WHEEZE OR SHORTNESS OF BREATH     Current Facility-Administered Medications on File Prior to Visit  Medication Dose Route Frequency Provider Last Rate Last Admin  . albuterol (PROVENTIL) (2.5 MG/3ML) 0.083% nebulizer solution 2.5 mg  2.5 mg Nebulization Once Delsa Grana, PA-C       Allergies  Allergen Reactions  . Actos [Pioglitazone] Swelling    EDEMA REACTION UNSPECIFIED  . Avandia [Rosiglitazone] Swelling    SWELLING REACTION UNSPECIFIED   . Dilaudid [Hydromorphone Hcl] Other (See Comments)    Made him crazy   Social History   Socioeconomic History  . Marital status: Married    Spouse name: mary lou  . Number of children: 3  . Years of education: trade   . Highest education level: Not on file  Occupational History  . Occupation: truck Geophysicist/field seismologist    Comment: retired  Tobacco Use  . Smoking status: Current Every Day Smoker    Packs/day: 3.00    Years: 54.00    Pack years: 162.00    Types:  Cigarettes    Start date: 1963  . Smokeless tobacco: Never Used  . Tobacco comment: 1ppd as of 01/04/21  ep  Vaping Use  . Vaping Use: Former  Substance and Sexual Activity  . Alcohol use: No    Comment: no alcolol since 2013  . Drug use: Yes    Types: Cocaine, Marijuana    Comment: quit 1990's  . Sexual activity: Not Currently  Other Topics Concern  . 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   Social Determinants of Health   Financial Resource Strain: Low Risk   . Difficulty of Paying Living Expenses: Not very hard  Food Insecurity: Not on file  Transportation Needs: Not on file  Physical Activity: Not on file  Stress: Not on file  Social Connections: Not on file  Intimate Partner Violence: Not on file      Review of Systems  All other systems reviewed and are negative.      Objective:   Physical Exam Vitals reviewed.  Constitutional:      General: He is not in acute distress.    Appearance: He is ill-appearing. He is not toxic-appearing.  Cardiovascular:     Rate and Rhythm: Regular rhythm. Tachycardia present.     Heart sounds: Normal heart sounds.  Pulmonary:     Effort: No tachypnea or respiratory distress.     Breath sounds: Decreased air movement present. Decreased breath sounds present. No wheezing, rhonchi or rales.  Musculoskeletal:     Right lower leg: No edema.     Left lower leg: No edema.  Neurological:     Mental Status: He is alert.           Assessment & Plan:   Anemia, unspecified type - Plan: Vitamin B12, Iron  Pneumonia of right lower lobe due to infectious organism  At this point, I feel the patient is back to his baseline.  However his baseline status is poor.  He appears to be malnourished.  Therefore I recommended 1  to 2 cans of Ensure a day to try to improve protein calorie malnutrition.  He also has mildly anemic so we will check a B12 level and an iron level to see if there are any nutritional deficiencies that may be contributing to his anemia.  I feel that he is too frail to undergo any type of diagnostic procedure such as an EGD or colonoscopy at the present time and therefore I will not check his stool for blood.  I explained this to the patient and his wife and they are in agreement.  I believe the pneumonia has resolved and no additional antibiotics are necessary however I did asked the patient to liberalize his albuterol nebulizer therapy and start doing it at least twice a day in an effort to improve his dyspnea

## 2021-04-08 LAB — VITAMIN B12: Vitamin B-12: 717 pg/mL (ref 200–1100)

## 2021-04-08 LAB — IRON: Iron: 49 ug/dL — ABNORMAL LOW (ref 50–180)

## 2021-04-12 ENCOUNTER — Other Ambulatory Visit: Payer: Self-pay | Admitting: Radiation Oncology

## 2021-04-18 ENCOUNTER — Telehealth: Payer: Self-pay

## 2021-04-18 NOTE — Telephone Encounter (Signed)
Forms faxed to [facility] confirming change to tx and or med

## 2021-04-19 NOTE — Progress Notes (Addendum)
Subjective:   [name] is a 70 y.o. male who presents for Medicare Annual/Subsequent preventive examination.  I connected with Rishan Oyama today by telephone and verified that I am speaking with the correct person using two identifiers. Location patient: home Location provider: work Persons participating in the virtual visit: patient, provider.   I discussed the limitations, risks, security and privacy concerns of performing an evaluation and management service by telephone and the availability of in person appointments. I also discussed with the patient that there may be a patient responsible charge related to this service. The patient expressed understanding and verbally consented to this telephonic visit.    Interactive audio and video telecommunications were attempted between this provider and patient, however failed, due to patient having technical difficulties OR patient did not have access to video capability.  We continued and completed visit with audio only.     Review of Systems    N/A  Cardiac Risk Factors include: male gender;advanced age (>13men, >63 women);diabetes mellitus;hypertension;smoking/ tobacco exposure     Objective:    Today's Vitals   04/20/21 1405  PainSc: 5    There is no height or weight on file to calculate BMI.  Advanced Directives 04/20/2021 03/20/2021 03/20/2021 02/02/2020 01/13/2019 06/25/2018 11/13/2017  Does Patient Have a Medical Advance Directive? Yes Yes No No No No No  Type of Paramedic of [place];Living will [place];Living will - - - - -  Does patient want to make changes to medical advance directive? No - Patient declined No - Patient declined - - - - -  Copy of [place] in Chart? Yes - validated most recent copy scanned in chart (See row information) No - copy requested - - - - -  Would patient like information on creating a medical advance directive? - - - No -  Patient declined No - Patient declined - -  Pre-existing out of facility DNR order (yellow form or pink MOST form) - - - - - - -    Current Medications (verified) Outpatient Encounter Medications as of 04/20/2021  Medication Sig   acetaminophen (TYLENOL) 500 MG tablet Take 1,000 mg by mouth every 6 (six) hours as needed for moderate pain.   atorvastatin (LIPITOR) 40 MG tablet Take 40 mg by mouth daily.   FLUoxetine (PROZAC) 40 MG capsule TAKE 1 CAPSULE BY MOUTH EVERY DAY (Patient taking differently: Take 40 mg by mouth daily.)   gabapentin (NEURONTIN) 300 MG capsule TAKE 1 CAPSULE BY MOUTH THREE TIMES A DAY (Patient taking differently: Take 300 mg by mouth 3 (three) times daily.)   glucose blood (ONETOUCH ULTRA) test strip TEST ONCE DAILY E11.42   ipratropium-albuterol (DUONEB) 0.5-2.5 (3) MG/3ML SOLN Take 3 mLs by nebulization every 6 (six) hours as needed. (Patient taking differently: Take 3 mLs by nebulization every 6 (six) hours as needed (shortness of breath or wheezing).)   oxyCODONE (ROXICODONE) 5 MG/5ML solution Take 5-10 mLs (5-10 mg total) by mouth every 3 (three) hours as needed (shortness of breath).   traZODone (DESYREL) 100 MG tablet Take 2 tablets (200 mg total) by mouth at bedtime as needed for sleep.   diltiazem (CARDIZEM CD) 120 MG 24 hr capsule Take 1 capsule (120 mg total) by mouth daily. (Patient not taking: Reported on 04/20/2021)   nitroGLYCERIN (NITROSTAT) 0.4 MG SL tablet Place 0.4 mg under the tongue every 5 (five) minutes as needed for chest pain. (Patient not taking: Reported on  04/20/2021)   [DISCONTINUED] albuterol (VENTOLIN HFA) 108 (90 Base) MCG/ACT inhaler TAKE 2 PUFFS BY MOUTH EVERY 6 HOURS AS NEEDED FOR WHEEZE OR SHORTNESS OF BREATH   Facility-Administered Encounter Medications as of 04/20/2021  Medication   albuterol (PROVENTIL) (2.5 MG/3ML) 0.083% nebulizer solution 2.5 mg    Allergies (verified) Actos [pioglitazone], Avandia [rosiglitazone], and Dilaudid  [hydromorphone hcl]   History: Past Medical History:  Diagnosis Date   Allergy    Anemia    Anginal pain ([place])    Arthritis    "hands"   Asthma    Back pain    4 deteriorating disc and receives an injection q3-95mon;has been doing this for about 15yrs   Blood transfusion    as a child   Bronchitis    Cancer ([place]) dx'd 2013   Non-small cell lung cancer   Chronic kidney disease    acute kidney failure post surgery   COPD (chronic obstructive pulmonary disease) (HCC)    uses Albuterol and Spiriva daily   Coronary artery disease    has 1 stent   Depression    takes Prozac daily   Emphysema    sees Dr.Ramaswami for this   Family history of adverse reaction to anesthesia    mother was hard to wake up sometimes   GERD (gastroesophageal reflux disease)    takes Prilosec daily, EGD nml 06/2017   Glaucoma    hx of   H/O hiatal hernia    Hypertension    takes  HYzaar daily   Insomnia    takes Trazodone nightly   Lung mass    right upper lobe   Myocardial infarct [facility])    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   Obesity    Parathyroid disease ([place])    Periodic limb movements of sleep    Peripheral neuropathy    Pneumonia    hx of' 70 yo,rd' last time in 2013   Productive cough    white in color but no odor   Shortness of breath    with exertion    Sleep apnea    uses BiPaP; "no longer have sleep apnea since I have lost over 100 lbs"   Syncope and collapse 05/26/2013   Thyroid disease    Type II diabetes mellitus ([place])    takes Metformin bid and Novolog and Lantus daily   Past Surgical History:  Procedure Laterality Date   CARDIAC CATHETERIZATION  2006/2008/2009   CARPAL TUNNEL RELEASE     bilateral   COLONOSCOPY     CORONARY ANGIOPLASTY WITH STENT PLACEMENT     1 stent   ELBOW SURGERY     ulnar nerve; left   ESOPHAGOGASTRODUODENOSCOPY     EYE SURGERY     pt. denies   FUDUCIAL PLACEMENT Left 08/24/2016   Procedure: PLACEMENT OF  FUDUCIAL;  Surgeon: Melrose Nakayama, MD;  Location: [facility] OR;  Service: Thoracic;  Laterality: Left;   INGUINAL HERNIA REPAIR  1990's   double inguinal   LOBECTOMY  03/17/2012   upper right side with resection   MICROLARYNGOSCOPY WITH CO2 LASER AND EXCISION OF VOCAL CORD LESION N/A 06/28/2017   Procedure: MICROLARYNGOSCOPY WITH BIOPSY;  Surgeon: Melida Quitter, MD;  Location: [facility] OR;  Service: ENT;  Laterality: N/A;   MICROLARYNGOSCOPY WITH CO2 LASER AND EXCISION OF VOCAL CORD LESION N/A 07/19/2017   Procedure: MICROLARYNGOSCOPY WITH CO2 LASER AND EXCISION OF VOCAL CORD LESION;  Surgeon: Melida Quitter, MD;  Location: [facility] OR;  Service: ENT;  Laterality: N/A;  MICRO DIRECT LARYNGOSCOPY WITH CO2 LASER VOCAL CORD STRIPPING/POSS JET VENTURI VENTILATION   REFRACTIVE SURGERY     bilaterally   ROTATOR CUFF REPAIR     left   VIDEO BRONCHOSCOPY WITH ENDOBRONCHIAL NAVIGATION N/A 08/24/2016   Procedure: VIDEO BRONCHOSCOPY WITH ENDOBRONCHIAL NAVIGATION;  Surgeon: Melrose Nakayama, MD;  Location: [facility] OR;  Service: Thoracic;  Laterality: N/A;   Family History  Problem Relation Age of Onset   COPD Mother    Diabetes Mother    Hypertension Mother    Heart attack Father    Diabetes Father    Anesthesia problems Neg Hx    Hypotension Neg Hx    Malignant hyperthermia Neg Hx    Pseudochol deficiency Neg Hx    Social History   Socioeconomic History   Marital status: Married    Spouse name: mary lou   Number of children: 3   Years of education: trade    Highest education level: Not on file  Occupational History   Occupation: truck Geophysicist/field seismologist    Comment: retired  Tobacco Use   Smoking status: Every Day    Packs/day: 3.00    Years: 54.00    Pack years: 162.00    Types: Cigarettes    Start date: 1963   Smokeless tobacco: Never   Tobacco comments:    1ppd as of 01/04/21  ep  Vaping Use   Vaping Use: Former  Substance and Sexual Activity   Alcohol use: No    Comment: no alcolol since 2013   Drug  use: Yes    Types: Cocaine, Marijuana    Comment: quit 1990's   Sexual activity: Not Currently  Other Topics Concern   Not on file  Social History Narrative   Lives in [place] with wife   She is his NOK   Was a truck driver for 40 yr, 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   Social Determinants of Health   Financial Resource Strain: Low Risk    Difficulty of Paying Living Expenses: Not hard at all  Food Insecurity: No Food Insecurity   Worried About Charity fundraiser in the Last Year: Never true   [place] in the Last Year: Never true  Transportation Needs: No Transportation Needs   Lack of Transportation (Medical): No   Lack of Transportation (Non-Medical): No  Physical Activity: Inactive   Days of Exercise per Week: 0 days   Minutes of Exercise per Session: 0 min  Stress: No Stress Concern Present   Feeling of Stress : Not at all  Social Connections: Moderately Isolated   Frequency of Communication with Friends and Family: Never   Frequency of Social Gatherings with Friends and Family: More than three times a week   Attends Religious Services: Never   Marine scientist or Organizations: No   Attends Archivist Meetings: Never   Marital Status: Married    Tobacco Counseling Ready to quit: Not Answered Counseling given: Not Answered Tobacco comments: 1ppd as of 01/04/21  ep   Clinical Intake:  Pre-visit preparation completed: Yes  Pain : 0-10 Pain Score: 5  Pain Type: Chronic pain Pain Location: Chest Pain Descriptors / Indicators: Stabbing Pain Onset: More than a month ago Pain Frequency: Intermittent     Nutritional Risks:  Nausea/ vomitting/ diarrhea, Unintentional weight loss Diabetes: Yes CBG done?: No Did pt. bring in CBG monitor from home?: No  How often do you need to have someone help you when you read instructions, pamphlets, or  other written materials from your doctor or pharmacy?: 4 - Often  Diabetic?Yes  Nutrition Risk Assessment:  Has the patient had any N/V/D within the last 2 months?  Yes  Does the patient have any non-healing wounds?  No  Has the patient had any unintentional weight loss or weight gain?  Yes   Diabetes:  Is the patient diabetic?  Yes  If diabetic, was a CBG obtained today?  No  Did the patient bring in their glucometer from home?  No  How often do you monitor your CBG's? Patient states checks glucose once per day.   Financial Strains and Diabetes Management:  Are you having any financial strains with the device, your supplies or your medication? No .  Does the patient want to be seen by Chronic Care Management for management of their diabetes?  No  Would the patient like to be referred to a Nutritionist or for Diabetic Management?  No   Diabetic Exams:  Diabetic Eye Exam: Completed 07/08/2020 Diabetic Foot Exam: Overdue, Pt has been advised about the importance in completing this exam. Pt is scheduled for diabetic foot exam on next scheduled office visit.  Interpreter Needed?: No  Information entered by :: [place] of Daily Living In your present state of health, do you have any difficulty performing the following activities: 04/20/2021 03/20/2021  Hearing? N N  Vision? N Y  Comment - trouble seeing the last 2 days  Difficulty concentrating or making decisions? Tempie Donning  Comment has long term memort difficulties with low oxygen levels  Walking or climbing stairs? Y Y  Dressing or bathing? N N  Doing errands, shopping? Y N  Preparing Food and eating ? N -  Using the Toilet? N -  In the past six months, have you accidently leaked urine? N -  Do you have problems with loss of bowel control? N -  Managing your Medications? N -  Managing your Finances? N -  Housekeeping or managing your Housekeeping? N -  Some recent data might be hidden    Patient Care  Team: [name], MD as PCP - General (Family Medicine) Belva Crome, MD as PCP - Cardiology (Cardiology) [name], MD (Family Medicine) Brand Males, MD (Pulmonary Disease) Wilford Corner, MD as Attending Physician (Gastroenterology) Belva Crome, MD as Consulting Physician (Cardiology) Tyler Pita, MD as Consulting Physician (Radiation Oncology) Freeman Caldron, PA-C as Physician Assistant (Urology) Leota Sauers, RN (Inactive) as Oncology Nurse Navigator (Oncology) Karie Mainland, RD as Dietitian (Nutrition) Jomarie Longs, PT (Inactive) as Physical Therapist (Physical Therapy) Sharen Counter, CCC-SLP as Speech Language Pathologist (Speech Pathology) Kennith Center, LCSW as Social Worker Edythe Clarity, [facility] as Pharmacist (Pharmacist)  Indicate any recent Medical Services you may have received from other than [facility] providers in the past year (date may be approximate).     Assessment:   This is a routine wellness examination for Santhosh.  Hearing/Vision screen Vision Screening - Comments:: Patient gets eye examined once per year. Currently wears reading glasses  Dietary issues and exercise activities discussed: Current Exercise Habits: The patient does not participate in regular exercise at present, Exercise limited by: respiratory conditions(s)   Goals Addressed  This Visit's Progress    Prevent falls         Depression Screen PHQ 2/9 Scores 04/20/2021 03/30/2021 08/11/2019 08/11/2019 01/15/2018 08/07/2017 05/07/2017  PHQ - 2 Score 0 0 0 0 0 1 1    Fall Risk Fall Risk  04/20/2021 03/30/2021 08/11/2019 05/07/2018 01/15/2018  Falls in the past year? 1 1 1  Yes No  Number falls in past yr: 0 1 - 1 -  Injury with Fall? 0 0 - No -  Risk for fall due to : Impaired balance/gait Impaired balance/gait;History of fall(s);Impaired mobility - - -  Follow up Falls evaluation completed;Falls prevention discussed Falls evaluation  completed Falls evaluation completed - -    FALL RISK PREVENTION PERTAINING TO THE HOME:  Any stairs in or around the home? No  If so, are there any without handrails? No  Home free of loose throw rugs in walkways, pet beds, electrical cords, etc? Yes  Adequate lighting in your home to reduce risk of falls? Yes   ASSISTIVE DEVICES UTILIZED TO PREVENT FALLS:  Life alert? No  Use of a cane, walker or w/c? No  Grab bars in the bathroom? Yes  Shower chair or bench in shower? Yes  Elevated toilet seat or a handicapped toilet? No     Cognitive Function:  Normal cognitive status assessed by direct observation by this Nurse Health Advisor. No abnormalities found.        Immunizations Immunization History  Administered Date(s) Administered   Fluad Quad(high Dose 65+) 08/11/2019   Influenza Split 08/21/2012, 08/25/2013   Influenza Whole 06/06/2009, 07/07/2011   Influenza, High Dose Seasonal PF 08/13/2017, 09/01/2018, 08/22/2020   Influenza,inj,Quad PF,6+ Mos 10/25/2014, 08/17/2016   Influenza-Unspecified 08/09/2010, 08/13/2017   PFIZER(Purple Top)SARS-COV-2 Vaccination 12/28/2019, 01/18/2020   Pneumococcal Conjugate-13 10/02/2013   Pneumococcal Polysaccharide-23 11/11/2008, 09/22/2013, 08/11/2019   Pneumococcal-Unspecified 03/05/2006   Tdap 09/09/2012   Zoster, Live 08/05/2012, 09/09/2012    TDAP status: Up to date  Flu Vaccine status: Up to date  Pneumococcal vaccine status: Up to date  Covid-19 vaccine status: Completed vaccines  Qualifies for Shingles Vaccine? Yes   Zostavax completed Yes   Shingrix Completed?: No.    Education has been provided regarding the importance of this vaccine. Patient has been advised to call insurance company to determine out of pocket expense if they have not yet received this vaccine. Advised may also receive vaccine at local pharmacy or Health Dept. Verbalized acceptance and understanding.  Screening Tests Health Maintenance  Topic Date  Due   URINE MICROALBUMIN  Never done   Hepatitis C Screening  Never done   Zoster Vaccines- Shingrix (1 of 2) Never done   COVID-19 Vaccine (3 - Pfizer risk series) 02/15/2020   FOOT EXAM  07/14/2020   INFLUENZA VACCINE  06/05/2021   OPHTHALMOLOGY EXAM  07/08/2021   COLONOSCOPY (Pts 45-5yrs Insurance coverage will need to be confirmed)  09/18/2021   HEMOGLOBIN A1C  09/20/2021   TETANUS/TDAP  09/09/2022   PNA vac Low Risk Adult  Completed   HPV VACCINES  Aged Out    Health Maintenance  Health Maintenance Due  Topic Date Due   URINE MICROALBUMIN  Never done   Hepatitis C Screening  Never done   Zoster Vaccines- Shingrix (1 of 2) Never done   COVID-19 Vaccine (3 - Pfizer risk series) 02/15/2020   FOOT EXAM  07/14/2020    Colorectal cancer screening: Type of screening: Colonoscopy. Completed 09/19/2011. Repeat every 10 years  Lung Cancer Screening: (  Low Dose CT Chest recommended if Age 2-80 years, 30 pack-year currently smoking OR have quit w/in 15years.) does not qualify.   Lung Cancer Screening Referral: N/A   Additional Screening:  Hepatitis C Screening: does qualify;   Vision Screening: Recommended annual ophthalmology exams for early detection of glaucoma and other disorders of the eye. Is the patient up to date with their annual eye exam?  Yes  Who is the provider or what is the name of the office in which the patient attends annual eye exams? Dr. Satira Sark If pt is not established with a provider, would they like to be referred to a provider to establish care? No .   Dental Screening: Recommended annual dental exams for proper oral hygiene  Community Resource Referral / Chronic Care Management: CRR required this visit?  No   CCM required this visit?  No      Plan:     I have personally reviewed and noted the following in the patient's chart:   Medical and social history Use of alcohol, tobacco or illicit drugs  Current medications and supplements including  opioid prescriptions. Patient is currently taking opioid prescriptions. Information provided to patient regarding non-opioid alternatives. Patient advised to discuss non-opioid treatment plan with their provider. Functional ability and status Nutritional status Physical activity Advanced directives List of other physicians Hospitalizations, surgeries, and ER visits in previous 12 months Vitals Screenings to include cognitive, depression, and falls Referrals and appointments  In addition, I have reviewed and discussed with patient certain preventive protocols, quality metrics, and best practice recommendations. A written personalized care plan for preventive services as well as general preventive health recommendations were provided to patient.     Ofilia Neas, LPN   [id number]   Nurse Notes: None

## 2021-04-20 ENCOUNTER — Ambulatory Visit (INDEPENDENT_AMBULATORY_CARE_PROVIDER_SITE_OTHER): Payer: PPO

## 2021-04-20 DIAGNOSIS — Z0000 Encounter for general adult medical examination without abnormal findings: Secondary | ICD-10-CM | POA: Diagnosis not present

## 2021-04-20 NOTE — Patient Instructions (Signed)
[name] , Thank you for taking time to come for your Medicare Wellness Visit. I appreciate your ongoing commitment to your health goals. Please review the following plan we discussed and let me know if I can assist you in the future.   Screening recommendations/referrals: Colonoscopy: Up to date, next due 09/18/2021 Recommended yearly ophthalmology/optometry visit for glaucoma screening and checkup Recommended yearly dental visit for hygiene and checkup  Vaccinations: Influenza vaccine: Up to date, next due fall 2022  Pneumococcal vaccine: Completed series Tdap vaccine: Up to date, next due 09/09/2022 Shingles vaccine: Currently due, you may receive at your local pharmacy    Advanced directives: copies on file  Conditions/risks identified: none  Next appointment: none  Preventive Care 71 Years and Older, Male Preventive care refers to lifestyle choices and visits with your health care provider that can promote health and wellness. What does preventive care include? A yearly physical exam. This is also called an annual well check. Dental exams once or twice a year. Routine eye exams. Ask your health care provider how often you should have your eyes checked. Personal lifestyle choices, including: Daily care of your teeth and gums. Regular physical activity. Eating a healthy diet. Avoiding tobacco and drug use. Limiting alcohol use. Practicing safe sex. Taking low doses of aspirin every day. Taking vitamin and mineral supplements as recommended by your health care provider. What happens during an annual well check? The services and screenings done by your health care provider during your annual well check will depend on your age, overall health, lifestyle risk factors, and family history of disease. Counseling  Your health care provider may ask you questions about your: Alcohol use. Tobacco use. Drug use. Emotional well-being. Home and relationship well-being. Sexual  activity. Eating habits. History of falls. Memory and ability to understand (cognition). Work and work Statistician. Screening  You may have the following tests or measurements: Height, weight, and BMI. Blood pressure. Lipid and cholesterol levels. These may be checked every 5 years, or more frequently if you are over 71 years old. Skin check. Lung cancer screening. You may have this screening every year starting at age 71 if you have a 30-pack-year history of smoking and currently smoke or have quit within the past 15 years. Fecal occult blood test (FOBT) of the stool. You may have this test every year starting at age 71. Flexible sigmoidoscopy or colonoscopy. You may have a sigmoidoscopy every 5 years or a colonoscopy every 10 years starting at age 71. Prostate cancer screening. Recommendations will vary depending on your family history and other risks. Hepatitis C blood test. Hepatitis B blood test. Sexually transmitted disease (STD) testing. Diabetes screening. This is done by checking your blood sugar (glucose) after you have not eaten for a while (fasting). You may have this done every 1-3 years. Abdominal aortic aneurysm (AAA) screening. You may need this if you are a current or former smoker. Osteoporosis. You may be screened starting at age 71 if you are at high risk. Talk with your health care provider about your test results, treatment options, and if necessary, the need for more tests. Vaccines  Your health care provider may recommend certain vaccines, such as: Influenza vaccine. This is recommended every year. Tetanus, diphtheria, and acellular pertussis (Tdap, Td) vaccine. You may need a Td booster every 10 years. Zoster vaccine. You may need this after age 71. Pneumococcal 13-valent conjugate (PCV13) vaccine. One dose is recommended after age 71. Pneumococcal polysaccharide (PPSV23) vaccine. One dose is  recommended after age 71. Talk to your health care provider about which  screenings and vaccines you need and how often you need them. This information is not intended to replace advice given to you by your health care provider. Make sure you discuss any questions you have with your health care provider. Document Released: 11/18/2015 Document Revised: 07/11/2016 Document Reviewed: 08/23/2015 Elsevier Interactive Patient Education  2017 [place] Prevention in the Home Falls can cause injuries. They can happen to people of all ages. There are many things you can do to make your home safe and to help prevent falls. What can I do on the outside of my home? Regularly fix the edges of walkways and driveways and fix any cracks. Remove anything that might make you trip as you walk through a door, such as a raised step or threshold. Trim any bushes or trees on the path to your home. Use bright outdoor lighting. Clear any walking paths of anything that might make someone trip, such as rocks or tools. Regularly check to see if handrails are loose or broken. Make sure that both sides of any steps have handrails. Any raised decks and porches should have guardrails on the edges. Have any leaves, snow, or ice cleared regularly. Use sand or salt on walking paths during winter. Clean up any spills in your garage right away. This includes oil or grease spills. What can I do in the bathroom? Use night lights. Install grab bars by the toilet and in the tub and shower. Do not use towel bars as grab bars. Use non-skid mats or decals in the tub or shower. If you need to sit down in the shower, use a plastic, non-slip stool. Keep the floor dry. Clean up any water that spills on the floor as soon as it happens. Remove soap buildup in the tub or shower regularly. Attach bath mats securely with double-sided non-slip rug tape. Do not have throw rugs and other things on the floor that can make you trip. What can I do in the bedroom? Use night lights. Make sure that you have a  light by your bed that is easy to reach. Do not use any sheets or blankets that are too big for your bed. They should not hang down onto the floor. Have a firm chair that has side arms. You can use this for support while you get dressed. Do not have throw rugs and other things on the floor that can make you trip. What can I do in the kitchen? Clean up any spills right away. Avoid walking on wet floors. Keep items that you use a lot in easy-to-reach places. If you need to reach something above you, use a strong step stool that has a grab bar. Keep electrical cords out of the way. Do not use floor polish or wax that makes floors slippery. If you must use wax, use non-skid floor wax. Do not have throw rugs and other things on the floor that can make you trip. What can I do with my stairs? Do not leave any items on the stairs. Make sure that there are handrails on both sides of the stairs and use them. Fix handrails that are broken or loose. Make sure that handrails are as long as the stairways. Check any carpeting to make sure that it is firmly attached to the stairs. Fix any carpet that is loose or worn. Avoid having throw rugs at the top or bottom of the stairs. If  you do have throw rugs, attach them to the floor with carpet tape. Make sure that you have a light switch at the top of the stairs and the bottom of the stairs. If you do not have them, ask someone to add them for you. What else can I do to help prevent falls? Wear shoes that: Do not have high heels. Have rubber bottoms. Are comfortable and fit you well. Are closed at the toe. Do not wear sandals. If you use a stepladder: Make sure that it is fully opened. Do not climb a closed stepladder. Make sure that both sides of the stepladder are locked into place. Ask someone to hold it for you, if possible. Clearly mark and make sure that you can see: Any grab bars or handrails. First and last steps. Where the edge of each step  is. Use tools that help you move around (mobility aids) if they are needed. These include: Canes. Walkers. Scooters. Crutches. Turn on the lights when you go into a dark area. Replace any light bulbs as soon as they burn out. Set up your furniture so you have a clear path. Avoid moving your furniture around. If any of your floors are uneven, fix them. If there are any pets around you, be aware of where they are. Review your medicines with your doctor. Some medicines can make you feel dizzy. This can increase your chance of falling. Ask your doctor what other things that you can do to help prevent falls. This information is not intended to replace advice given to you by your health care provider. Make sure you discuss any questions you have with your health care provider. Document Released: 08/18/2009 Document Revised: 03/29/2016 Document Reviewed: 11/26/2014 Elsevier Interactive Patient Education  2017 [employer].

## 2021-04-21 ENCOUNTER — Other Ambulatory Visit: Payer: Self-pay

## 2021-04-21 ENCOUNTER — Encounter: Payer: Self-pay | Admitting: Podiatry

## 2021-04-21 ENCOUNTER — Ambulatory Visit (INDEPENDENT_AMBULATORY_CARE_PROVIDER_SITE_OTHER): Payer: Medicare Other | Admitting: Podiatry

## 2021-04-21 DIAGNOSIS — E1151 Type 2 diabetes mellitus with diabetic peripheral angiopathy without gangrene: Secondary | ICD-10-CM | POA: Diagnosis not present

## 2021-04-21 DIAGNOSIS — M79676 Pain in unspecified toe(s): Secondary | ICD-10-CM | POA: Diagnosis not present

## 2021-04-21 DIAGNOSIS — N183 Chronic kidney disease, stage 3 unspecified: Secondary | ICD-10-CM

## 2021-04-21 DIAGNOSIS — M2010 Hallux valgus (acquired), unspecified foot: Secondary | ICD-10-CM

## 2021-04-21 DIAGNOSIS — B351 Tinea unguium: Secondary | ICD-10-CM

## 2021-04-21 NOTE — Progress Notes (Signed)
This patient returns to my office for at risk foot care.  This patient requires this care by a professional since this patient will be at risk due to having diabetes.  History of throat cancer.  This patient is unable to cut nails himself since the patient cannot reach his nails.These nails are painful walking and wearing shoes.  This patient presents for at risk foot care today.  General Appearance  Alert, conversant and in no acute stress.  Vascular  Dorsalis pedis and posterior tibial  pulses are weakly  palpable  bilaterally.  Capillary return is within normal limits  bilaterally. Temperature is within normal limits  bilaterally.  Neurologic  Senn-Weinstein monofilament wire test diminished   bilaterally. Muscle power within normal limits bilaterally.  Nails Thick disfigured discolored nails with subungual debris  from hallux to fifth toes bilaterally. No evidence of bacterial infection or drainage bilaterally.  Orthopedic  No limitations of motion  feet .  No crepitus or effusions noted.  No bony pathology or digital deformities noted.HAV  B/L.  Skin  normotropic skin with no porokeratosis noted bilaterally.  No signs of infections or ulcers noted.     Onychomycosis  Pain in right toes  Pain in left toes  Consent was obtained for treatment procedures.   Mechanical debridement of nails 1-5  bilaterally performed with a nail nipper.  Filed with dremel without incident.    Return office visit    3 months                  Told patient to return for periodic foot care and evaluation due to potential at risk complications.   Gardiner Barefoot DPM

## 2021-04-24 ENCOUNTER — Ambulatory Visit: Payer: PPO | Admitting: Radiation Oncology

## 2021-05-01 ENCOUNTER — Other Ambulatory Visit: Payer: Self-pay

## 2021-05-01 MED ORDER — GABAPENTIN 300 MG PO CAPS: ORAL_CAPSULE | ORAL | 2 refills | Status: DC

## 2021-05-11 ENCOUNTER — Other Ambulatory Visit: Payer: Self-pay | Admitting: Family Medicine

## 2021-05-31 ENCOUNTER — Telehealth: Payer: Self-pay

## 2021-06-01 ENCOUNTER — Telehealth: Payer: Self-pay

## 2021-06-03 ENCOUNTER — Other Ambulatory Visit: Payer: Self-pay | Admitting: Family Medicine

## 2021-06-07 ENCOUNTER — Telehealth: Payer: Self-pay | Admitting: *Deleted

## 2021-06-07 NOTE — Telephone Encounter (Signed)
Received call from Dr. Lyman Speller with Hospice.   Reports that patient health has improved and will be discharged from [facility] as of 06/08/2021. Reports that he will be transferred to Palliative Care Services.   States that at [facility] admission, patient was unable to sit comfortably without Oxygen therapy. Reports that patient is now able to perform yard work without O2 tanks.   PCP to be made aware.

## 2021-06-08 ENCOUNTER — Telehealth: Payer: Self-pay

## 2021-06-08 NOTE — Telephone Encounter (Signed)
Spoke with patient's wife Marylou. They decline Palliative care at this time. Will cancel referral.

## 2021-06-14 DIAGNOSIS — N1831 Chronic kidney disease, stage 3a: Secondary | ICD-10-CM | POA: Diagnosis not present

## 2021-06-14 DIAGNOSIS — E1142 Type 2 diabetes mellitus with diabetic polyneuropathy: Secondary | ICD-10-CM | POA: Diagnosis not present

## 2021-06-15 ENCOUNTER — Ambulatory Visit (INDEPENDENT_AMBULATORY_CARE_PROVIDER_SITE_OTHER): Payer: PPO | Admitting: Pharmacist

## 2021-06-15 DIAGNOSIS — F32A Depression, unspecified: Secondary | ICD-10-CM

## 2021-06-15 DIAGNOSIS — I10 Essential (primary) hypertension: Secondary | ICD-10-CM

## 2021-06-15 DIAGNOSIS — Z794 Long term (current) use of insulin: Secondary | ICD-10-CM

## 2021-06-15 DIAGNOSIS — E1159 Type 2 diabetes mellitus with other circulatory complications: Secondary | ICD-10-CM | POA: Diagnosis not present

## 2021-06-15 NOTE — Progress Notes (Signed)
Chronic Care Management Pharmacy Note  06/20/2021 Name:  MATTHEWS FRANKS MRN:  [id number] DOB:  1951/07/31  Subjective: [name] is an 70 y.o. year old male who is a primary patient of Pickard, Cammie Mcgee, MD.  The CCM team was consulted for assistance with disease management and care coordination needs.    Engaged with patient by telephone for follow up visit in response to provider referral for pharmacy case management and/or care coordination services.   Consent to Services:  The patient was given the following information about Chronic Care Management services today, agreed to services, and gave verbal consent: 1. CCM service includes personalized support from designated clinical staff supervised by the primary care provider, including individualized plan of care and coordination with other care providers 2. 24/7 contact phone numbers for assistance for urgent and routine care needs. 3. Service will only be billed when office clinical staff spend 20 minutes or more in a month to coordinate care. 4. Only one practitioner may furnish and bill the service in a calendar month. 5.The patient may stop CCM services at any time (effective at the end of the month) by phone call to the office staff. 6. The patient will be responsible for cost sharing (co-pay) of up to 20% of the service fee (after annual deductible is met). Patient agreed to services and consent obtained.  Patient Care Team: [name], MD as PCP - General (Family Medicine) Belva Crome, MD as PCP - Cardiology (Cardiology) [name], MD (Family Medicine) Brand Males, MD (Pulmonary Disease) Wilford Corner, MD as Attending Physician (Gastroenterology) Belva Crome, MD as Consulting Physician (Cardiology) Tyler Pita, MD as Consulting Physician (Radiation Oncology) Freeman Caldron, PA-C as Physician Assistant (Urology) Leota Sauers, RN (Inactive) as Oncology Nurse Navigator  (Oncology) Karie Mainland, RD as Dietitian (Nutrition) Jomarie Longs, PT (Inactive) as Physical Therapist (Physical Therapy) Sharen Counter, [place] as Speech Language Pathologist (Speech Pathology) Kennith Center, LCSW as Social Worker Edythe Clarity, [facility] as Pharmacist (Pharmacist)  Recent office visits: 04/07/21 Dennard Schaumann) - recommended 1-2 cans of Ensure per day.  Asked them to do his nebulizer treatment twice daily for dyspnea.  03/30/21 (Pickard) - losartan was d/c due to low BP  Recent consult visits: None recent  Hospital visits: 03/20/21 - Admitted in respiratory failure - started on empiric ABX course with azithromycin, ceftriaxone, vancomycin.  Diagnosed with pneumonia.   Objective:  Lab Results  Component Value Date   CREATININE 1.40 (H) 03/30/2021   BUN 20 03/30/2021   GFR 41.76 (L) 04/30/2013   GFRNONAA 50 (L) 03/30/2021   GFRAA 58 (L) 03/30/2021   NA 142 03/30/2021   K 4.8 03/30/2021   CALCIUM 8.7 03/30/2021   CO2 33 (H) 03/30/2021   GLUCOSE 148 (H) 03/30/2021    Lab Results  Component Value Date/Time   HGBA1C 7.0 (H) 03/20/2021 05:31 PM   HGBA1C 5.9 (H) 03/29/2020 09:18 AM   GFR 41.76 (L) 04/30/2013 04:00 PM   GFR 47.34 (L) 01/14/2013 05:07 PM    Last diabetic Eye exam:  Lab Results  Component Value Date/Time   HMDIABEYEEXA No Retinopathy 07/08/2020 04:42 PM    Last diabetic Foot exam:  Lab Results  Component Value Date/Time   HMDIABFOOTEX Dr Buddy Duty 12/08/2012 12:00 AM     Lab Results  Component Value Date   CHOL 119 03/29/2020   HDL 39 (L) 03/29/2020   LDLCALC 63 03/29/2020   TRIG 87 03/29/2020  CHOLHDL 3.1 03/29/2020    Hepatic Function Latest Ref Rng & Units 03/20/2021 03/23/2020 08/11/2019  Total Protein 6.5 - 8.1 g/dL 6.6 7.3 6.8  Albumin 3.5 - 5.0 g/dL 2.8(L) - -  AST 15 - 41 U/L 28 49(H) 13  ALT 0 - 44 U/L 38 18 10  Alk Phosphatase 38 - 126 U/L 90 - -  Total Bilirubin 0.3 - 1.2 mg/dL 0.4 1.0 0.6  Bilirubin, Direct 0.0  - 0.3 mg/dL - - -    Lab Results  Component Value Date/Time   TSH 3.168 12/24/2018 01:30 PM   TSH 1.727 01/07/2018 07:55 AM   TSH 3.54 05/10/2017 08:50 AM   TSH 2.500 05/21/2013 11:41 AM   FREET4 1.00 01/23/2013 09:31 AM   FREET4 0.91 01/06/2009 05:30 AM    CBC Latest Ref Rng & Units 03/30/2021 03/21/2021 03/20/2021  WBC 3.8 - 10.8 Thousand/uL 11.6(H) 17.7(H) 12.8(H)  Hemoglobin 13.2 - 17.1 g/dL 11.6(L) 10.9(L) 11.3(L)  Hematocrit 38.5 - 50.0 % 36.3(L) 35.7(L) 35.2(L)  Platelets 140 - 400 Thousand/uL 294 256 270    Lab Results  Component Value Date/Time   VD25OH 41 02/23/2013 09:27 AM   VD25OH 36 04/23/2012 09:38 AM    Clinical ASCVD: No  The ASCVD Risk score Mikey Bussing DC Jr., et al., 2013) failed to calculate for the following reasons:   The valid total cholesterol range is 130 to 320 mg/dL    Depression screen [facility] 2/9 04/20/2021 03/30/2021 08/11/2019  Decreased Interest 0 0 0  Down, Depressed, Hopeless 0 0 0  PHQ - 2 Score 0 0 0  Some recent data might be hidden     Social History   Tobacco Use  Smoking Status Every Day   Packs/day: 3.00   Years: 54.00   Pack years: 162.00   Types: Cigarettes   Start date: 1963  Smokeless Tobacco Never  Tobacco Comments   1ppd as of 01/04/21  ep   BP Readings from Last 3 Encounters:  04/07/21 118/62  03/30/21 (!) 98/52  03/22/21 (!) 121/49   Pulse Readings from Last 3 Encounters:  04/07/21 88  03/30/21 (!) 102  03/22/21 74   Wt Readings from Last 3 Encounters:  04/07/21 153 lb (69.4 kg)  03/30/21 152 lb (68.9 kg)  03/21/21 141 lb 5 oz (64.1 kg)   BMI Readings from Last 3 Encounters:  04/07/21 21.34 kg/m  03/30/21 21.20 kg/m  03/21/21 19.71 kg/m    Assessment/Interventions: Review of patient past medical history, allergies, medications, health status, including review of consultants reports, laboratory and other test data, was performed as part of comprehensive evaluation and provision of chronic care management  services.   SDOH:  (Social Determinants of Health) assessments and interventions performed: Yes  Financial Resource Strain: Low Risk    Difficulty of Paying Living Expenses: Not hard at all    SDOH Screenings   Alcohol Screen: Low Risk    Last Alcohol Screening Score (AUDIT): 0  Depression (PHQ2-9): Low Risk    PHQ-2 Score: 0  Financial Resource Strain: Low Risk    Difficulty of Paying Living Expenses: Not hard at all  Food Insecurity: No Food Insecurity   Worried About Charity fundraiser in the Last Year: Never true   Ran Out of Food in the Last Year: Never true  Housing: Low Risk    Last Housing Risk Score: 0  Physical Activity: Inactive   Days of Exercise per Week: 0 days   Minutes of Exercise per Session:  0 min  Social Connections: Moderately Isolated   Frequency of Communication with Friends and Family: Never   Frequency of Social Gatherings with Friends and Family: More than three times a week   Attends Religious Services: Never   Marine scientist or Organizations: No   Attends Music therapist: Never   Marital Status: Married  Stress: No Stress Concern Present   Feeling of Stress : Not at all  Tobacco Use: High Risk   Smoking Tobacco Use: Every Day   Smokeless Tobacco Use: Never  Transportation Needs: No Transportation Needs   Lack of Transportation (Medical): No   Lack of Transportation (Non-Medical): No    CCM Care Plan  Allergies  Allergen Reactions   Actos [Pioglitazone] Swelling    EDEMA REACTION UNSPECIFIED   Avandia [Rosiglitazone] Swelling    SWELLING REACTION UNSPECIFIED    Dilaudid [Hydromorphone Hcl] Other (See Comments)    Made him crazy    Medications Reviewed Today     Reviewed by Edythe Clarity, [facility] (Pharmacist) on 06/20/21 at 87  Med List Status: <None>   Medication Order Taking? Sig Documenting Provider Last Dose Status Informant  acetaminophen (TYLENOL) 500 MG tablet [id number] Yes Take 1,000 mg by mouth every  6 (six) hours as needed for moderate pain. [provider] Taking Active Spouse/Significant Other  albuterol (PROVENTIL) (2.5 MG/3ML) 0.083% nebulizer solution 2.5 mg [id number]   Delsa Grana, PA-C  Active     Discontinued 07/15/19 0810   atorvastatin (LIPITOR) 40 MG tablet [id number] Yes Take 40 mg by mouth daily. [provider] Taking Active Spouse/Significant Other  diltiazem (CARDIZEM CD) 120 MG 24 hr capsule [id number] Yes Take 1 capsule (120 mg total) by mouth daily. Belva Crome, MD Taking Active   FLUoxetine (PROZAC) 40 MG capsule [id number] Yes TAKE 1 CAPSULE BY MOUTH EVERY DAY [name], MD Taking Active   gabapentin (NEURONTIN) 300 MG capsule [id number] Yes TAKE 1 CAPSULE BY MOUTH THREE TIMES A DAY [name], MD Taking Active   glucose blood (ONETOUCH ULTRA) test strip [id number] Yes TEST ONCE DAILY E11.42 [provider] Taking Active Spouse/Significant Other  ipratropium-albuterol (DUONEB) 0.5-2.5 (3) MG/3ML SOLN [id number] Yes Take 3 mLs by nebulization every 6 (six) hours as needed.  Patient taking differently: Take 3 mLs by nebulization every 6 (six) hours as needed (shortness of breath or wheezing).   Brand Males, MD Taking Active   nitroGLYCERIN (NITROSTAT) 0.4 MG SL tablet [id number] Yes Place 0.4 mg under the tongue every 5 (five) minutes as needed for chest pain. [provider] Taking Active            Med Note Louretta Shorten, APRIL   Fri Oct 09, 2019 10:28 AM)    oxyCODONE (ROXICODONE) 5 MG/5ML solution [id number] Yes Take 5-10 mLs (5-10 mg total) by mouth every 3 (three) hours as needed (shortness of breath). [name], MD Taking Active   traZODone (DESYREL) 100 MG tablet [id number] Yes Take 2 tablets (200 mg total) by mouth at bedtime as needed for sleep. 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[facility]) - 2017 08/09/2016   CAD (coronary artery disease), native coronary artery 09/18/2015   Orthostatic hypotension 07/20/2015   Smoking 02/26/2015   Neurocardiogenic syncope 05/26/2013   Seizures ([place]) 05/03/2013   Chronic kidney disease    Community acquired pneumonia    Type II diabetes mellitus ([place])    OSA on CPAP 09/26/2012   Neuropathic pain of both legs 04/23/2012   S/P lobectomy of lung 04/04/2012   Non-small cell carcinoma of lung, stage 1 ([place]) 03/17/2012    Class: Stage 1   Chest pain, atypical 12/21/2011   COPD exacerbation ([place]) 09/20/2011   Acute respiratory failure with hypoxia ([place]) 09/20/2011   Hypertension    Depression    GERD (gastroesophageal reflux disease)    CIGARETTE SMOKER 09/25/2007   DIZZINESS AND GIDDINESS 09/25/2007   Chronic obstructive pulmonary disease ([place]) 08/22/2007    Immunization History  Administered Date(s) Administered   Fluad Quad(high Dose 65+) 08/11/2019   Influenza Split 08/21/2012, 08/25/2013   Influenza Whole 06/06/2009, 07/07/2011   Influenza, High Dose Seasonal PF 08/13/2017, 09/01/2018, 08/22/2020   Influenza,inj,Quad PF,6+ Mos 10/25/2014, 08/17/2016   Influenza-Unspecified 08/09/2010, 08/13/2017   PFIZER(Purple Top)SARS-COV-2 Vaccination 12/28/2019, 01/18/2020   Pneumococcal Conjugate-13 10/02/2013   Pneumococcal Polysaccharide-23 11/11/2008, 09/22/2013, 08/11/2019   Pneumococcal-Unspecified 03/05/2006   Tdap 09/09/2012   Zoster, Live 08/05/2012, 09/09/2012    Conditions to be addressed/monitored:  Hypertension, Diabetes, COPD, and  Depression  Care Plan : General Pharmacy (Adult)  Updates made by Edythe Clarity, RPH since 06/20/2021 12:00 AM     Problem: Hypertension, Diabetes, COPD, and Depression   Priority: High  Onset Date: 06/20/2021     Long-Range Goal: Patient-Specific Goal   Start Date: 06/20/2021  Expected End Date: 12/21/2021  This Visit's Progress: On track  Priority: High  Note:   Current Barriers:  Unable to independently afford treatment regimen  Pharmacist Clinical Goal(s):  Patient will maintain control of BP and glucose as evidenced by home monitoring  through collaboration with PharmD and provider.   Interventions: 1:1 collaboration with [name], MD regarding development and update of comprehensive plan of care as evidenced by provider attestation and co-signature Inter-disciplinary care team collaboration (see longitudinal plan of care) Comprehensive medication review performed; medication list updated in electronic medical record  Hypertension  (Status:Goal on track: YES.)   Med Management Intervention: Meds reviewed/updated  (BP goal <140/90) -Controlled -Current treatment: Diltiazem 124m daily -Medications previously tried: Losartan  -Current home readings: not checking very often, "normal" to borderline low in office -Denies hypotensive/hypertensive symptoms -Educated on BP goals and benefits of medications for prevention of heart attack, stroke and kidney damage; Daily salt intake goal < 2300 mg; Importance of home blood pressure monitoring; Symptoms of hypotension and importance of maintaining adequate hydration; -Counseled to monitor BP at home periodically, document, and provide log at future appointments -Recommended to continue current medication Contact providers with low BP or dizziness  Diabetes (A1c goal <6.5%) -Controlled -Current medications: None -Medications previously tried: pioglitazone (swelling)  -Current home glucose readings fasting  glucose: "normal" post prandial glucose: not checking -Denies hypoglycemic/hyperglycemic symptoms -Blood sugar controlled, A1c did trend up, watch sugars and carbs -Educated on A1c and blood sugar goals; Benefits of routine self-monitoring of blood sugar; -  Counseled to check feet daily and get yearly eye exams -Recommended to continue current management strategy  COPD (Goal: control symptoms and prevent exacerbations) -Controlled -Current treatment  Duoneb 0.5-2.5 BID Breztri twice daily -Medications previously tried: Spiriva ($$)   -Pulmonary function testing: Pulmonary Functions Testing Results:  No results found for: FEV1, FVC, FEV1FVC, TLC, DLCO  -Exacerbations requiring treatment in last 6 months: Yes, one -Patient denies consistent use of maintenance inhaler -Frequency of rescue inhaler use: daily Duonebs -Counseled on When to use rescue inhaler Differences between maintenance and rescue inhalers -Recently started on Breztri samples at discharge from hospital -Recommended to continue current medication Contact providers if medication is expensive once he finishes with samples  Depression/Anxiety (Goal: Minimize symptoms) -Controlled -Current treatment: Trazodone 21m hs for sleep Fluoxetine 460mdaily -Medications previously tried/failed: none noted -PHQ9:  PHQ9 SCORE ONLY 04/20/2021 03/30/2021 08/11/2019  PHQ-9 Total Score 0 0 0   -Educated on Benefits of medication for symptom control -he reports sleep has improved and his mood is controlled. -Recommended to continue current medication  Patient Goals/Self-Care Activities Patient will:  - take medications as prescribed check blood pressure periodically, document, and provide at future appointments collaborate with provider on medication access solutions  Follow Up Plan: The care management team will reach out to the patient again over the next 180 days.         Medication Assistance:  Pending copay of  Breztri at pharmacy.  Compliance/Adherence/Medication fill history: Care Gaps: Urine microalbumin needed  Star-Rating Drugs: Atorvastatin 4015m2/21/22 - patient states they have some at home  Patient's preferred pharmacy is:  [id number] [address] [id number]: [phone]: [phone] pill box? Yes Pt endorses 100% compliance  We discussed: Benefits of medication synchronization, packaging and delivery as well as enhanced pharmacist oversight with [employer]. Patient decided to: Continue current medication management strategy  Care Plan and Follow Up Patient Decision:  Patient agrees to Care Plan and Follow-up.  Plan: The care management team will reach out to the patient again over the next 180 days.  ChriBeverly MilcharmD Clinical Pharmacist [phone]

## 2021-06-20 ENCOUNTER — Other Ambulatory Visit: Payer: Self-pay

## 2021-06-20 ENCOUNTER — Encounter: Payer: Self-pay | Admitting: Family Medicine

## 2021-06-20 ENCOUNTER — Ambulatory Visit (INDEPENDENT_AMBULATORY_CARE_PROVIDER_SITE_OTHER): Payer: PPO | Admitting: Family Medicine

## 2021-06-20 VITALS — BP 118/68 | HR 80 | Temp 98.1°F | Resp 16 | Ht 71.0 in | Wt 167.0 lb

## 2021-06-20 DIAGNOSIS — I499 Cardiac arrhythmia, unspecified: Secondary | ICD-10-CM | POA: Diagnosis not present

## 2021-06-20 DIAGNOSIS — E118 Type 2 diabetes mellitus with unspecified complications: Secondary | ICD-10-CM

## 2021-06-20 DIAGNOSIS — J449 Chronic obstructive pulmonary disease, unspecified: Secondary | ICD-10-CM

## 2021-06-20 DIAGNOSIS — C3412 Malignant neoplasm of upper lobe, left bronchus or lung: Secondary | ICD-10-CM

## 2021-06-20 DIAGNOSIS — C320 Malignant neoplasm of glottis: Secondary | ICD-10-CM

## 2021-06-20 MED ORDER — ATORVASTATIN CALCIUM 20 MG PO TABS: 20.0000 mg | ORAL_TABLET | Freq: Every day | ORAL | 3 refills | Status: DC

## 2021-06-20 NOTE — Progress Notes (Signed)
Subjective:    Patient ID: [name], male    DOB: 1950-11-12, 70 y.o.   MRN: [id number]  HPI I sent the patient to the hospital at his last visit due to hypoxemic respiratory failure.  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  Sepsis at admission requiring peripheral pressors. He was sick for about 2 weeks with subjective fever and chills, cough and bringing up secretions.  He did follow-up with his primary care doctor-treated in the office and sent to the emergency department where he was admitted He was stabilized on antibiotics, pressors were weaned after 24 hours He was on antibiotics, IV steroids, bronchodilators He continues to stabilize however in the last 48 hours, started insisting about wanting to go home He states he just wants to go home and rest, he knows he is going to die.  Declining interventions     Consults: Palliative care medicine   Significant Diagnostic Studies: labs: Chest x-ray with multifocal infiltrates Chemistry within normal limits with elevated BUN/creatinine at baseline, leukocytosis stable   Treatments: antibiotics: Levaquin, steroids  Patient is here today for follow-up.  Chest x-ray obtained in the hospital showed multifocal pneumonia most pronounced in the right lower lobe.   However he looks much better than the last time I saw him.  He is resting comfortably in the exam room on 4 L.  He is not struggling to breathe.  He denies any chest pain.  He is not having to use any bronchodilators at home.  He is also not on steroids.  He took his last dose of Levaquin yesterday.  Hospice is involved however the patient has rallied and appears back to his baseline essentially aside from being on 2 additional liters of oxygen.  Wife states that he is not eating well or drinking well.  Blood pressure is still very low.  He is taking diltiazem for heart rate control at home as well as losartan for blood pressure control.  AT that time, my plan was: Breath sounds have improved dramatically from the last time I saw the patient.  I have asked to add an additional 3 days of Levaquin to complete 10 days total.  However patient has responded dramatically to antibiotics.  I have asked the patient to try to drink more fluids to get his blood pressure up.  I recommended Gatorade.  I have also asked the patient to discontinue losartan due to hypotension.  Check CBC and BMP.  Reassess next week or sooner if worsening.  04/07/21 No visits with results within 1 Week(s) from this visit.  Latest known visit with results is:  Office Visit on 04/07/2021  Component Date Value Ref Range Status   Vitamin B-12 04/07/2021 717  200 - 1,100 pg/mL Final   Iron 04/07/2021 49 (A) 50 - 180 mcg/dL Final   Recent lab work showed improvement in his renal function back to his baseline of  1.4.  White blood cell count had improved from 17 down to 11.  However hemoglobin had dropped from his baseline of 13 approximately a year ago to just over 11.  We had a discussion regarding this today.  The patient is too frail to tolerate an EGD or colonoscopy.  Therefore we have decided not to check his stool for blood.  They are interested in checking his B12 and iron levels in case this could be a nutritional deficiency in an effort  to try to improve his hemoglobin and improve his quality of life.  He states that his breathing is at his baseline.  However he is wheezing more today on exam than he was last time.  He also has diminished breath sounds throughout.  He denies any fevers or chills.  He has not been using his nebulizers at all.  He stated that he just started back on Breztri. At that time, my plan was: At this point, I feel the patient is back to his baseline.  However his baseline status is poor.  He appears to be malnourished.  Therefore I recommended 1 to 2 cans of Ensure a day to try to improve protein calorie malnutrition.  He also has mildly anemic so we will check a B12 level and an iron level to see if there are any nutritional deficiencies that may be contributing to his anemia.  I feel that he is too frail to undergo any type of diagnostic procedure such as an EGD or colonoscopy at the present time and therefore I will not check his stool for blood.  I explained this to the patient and his wife and they are in agreement.  I believe the pneumonia has resolved and no additional antibiotics are necessary however I did asked the patient to liberalize his albuterol nebulizer therapy and start doing it at least twice a day in an effort to improve his dyspnea  06/20/21 Since I last saw the patient, he has been discharged from hospice.  He has continued to rally and improve and is no longer wearing his oxygen 24/7.  In fact, when the hospice nurse recently checked on him he was outside working and has even been caught mopping the floor on occasion!  He is also eating better and has gained some weight: Wt Readings from Last 3 Encounters:  06/20/21 167 lb (75.8 kg)  04/07/21 153 lb (69.4 kg)  03/30/21 152 lb (68.9 kg)   As result, he no longer qualified for hospice care and has been returned to my care.  He is here today for follow-up.  He is consistently using his per history twice daily.  He states that his breathing is  doing about as good as it is been in the last few years.  He denies any hemoptysis or fevers or chills.  His blood pressure today is well controlled although his heart rate is irregular on my exam.  However I did an EKG today to rule out any underlying atrial fibrillation.  The patient was found to be in normal sinus rhythm with a first-degree AV block but no evidence of ischemia or infarction or atrial fibrillation.  He continues to have dysphagia but he does not want any further diagnostic work-up.  In fact he is decided not to return to see his cancer team.  He has decided that if the cancer returns he does not want any additional treatment.  He states that at this point he is willing to let nature  take its course.  Although he has been dismissed from hospice care, he does not want any residual treatment or testing for his cancer.  He prefers to focus on quality of life and to manage the other issues as they present themselves Past Medical History:  Diagnosis Date   Allergy    Anemia    Anginal pain ([place])    Arthritis    "hands"   Asthma    Back pain    4 deteriorating disc and receives an injection q3-71mon;has been doing this for about 48yrs   Blood transfusion    as a child   Bronchitis    Cancer ([place]) dx'd 2013   Non-small cell lung cancer   Chronic kidney disease    acute kidney failure post surgery   COPD (chronic obstructive pulmonary disease) (HCC)    uses Albuterol and Spiriva daily   Coronary artery disease    has 1 stent   Depression    takes Prozac daily   Emphysema    sees Dr.Ramaswami for this   Family history of adverse reaction to anesthesia    mother was hard to wake up sometimes   GERD (gastroesophageal reflux disease)    takes Prilosec daily, EGD nml 06/2017   Glaucoma    hx of   H/O hiatal hernia    Hypertension    takes  HYzaar daily   Insomnia    takes Trazodone nightly   Lung mass    right upper lobe   Myocardial infarct [facility])    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   Obesity    Parathyroid disease ([place])    Periodic limb movements of sleep    Peripheral neuropathy    Pneumonia    hx of' 70 yo,rd' last time in 2013   Productive cough    white in color but no odor   Shortness of breath    with exertion    Sleep apnea    uses BiPaP; "no longer have sleep apnea since I have lost over 100 lbs"   Syncope and collapse 05/26/2013   Thyroid disease    Type II diabetes mellitus ([place])    takes Metformin bid and Novolog and Lantus daily   Past Surgical History:  Procedure Laterality Date   CARDIAC CATHETERIZATION  2006/2008/2009   CARPAL TUNNEL RELEASE     bilateral   COLONOSCOPY     CORONARY ANGIOPLASTY WITH STENT PLACEMENT     1 stent   ELBOW SURGERY     ulnar nerve; left   ESOPHAGOGASTRODUODENOSCOPY     EYE SURGERY     pt. denies   FUDUCIAL PLACEMENT Left 08/24/2016   Procedure: PLACEMENT OF FUDUCIAL;  Surgeon: Melrose Nakayama, MD;  Location: [facility];  Service: Thoracic;  Laterality: Left;   INGUINAL HERNIA REPAIR  1990's   double inguinal   LOBECTOMY  03/17/2012   upper right side with resection   MICROLARYNGOSCOPY WITH CO2 LASER AND EXCISION OF VOCAL CORD LESION N/A 06/28/2017   Procedure: MICROLARYNGOSCOPY WITH BIOPSY;  Surgeon: Melida Quitter, MD;  Location: [facility];  Service: ENT;  Laterality: N/A;   MICROLARYNGOSCOPY WITH CO2 LASER AND EXCISION OF VOCAL CORD LESION N/A 07/19/2017   Procedure: MICROLARYNGOSCOPY WITH CO2 LASER AND EXCISION OF VOCAL CORD LESION;  Surgeon: Melida Quitter, MD;  Location: [facility];  Service: ENT;  Laterality: N/A;  MICRO DIRECT LARYNGOSCOPY WITH CO2 LASER VOCAL CORD STRIPPING/POSS JET VENTURI VENTILATION  REFRACTIVE SURGERY     bilaterally   ROTATOR CUFF REPAIR     left   VIDEO BRONCHOSCOPY WITH ENDOBRONCHIAL NAVIGATION N/A 08/24/2016   Procedure: VIDEO BRONCHOSCOPY WITH ENDOBRONCHIAL NAVIGATION;  Surgeon: Melrose Nakayama, MD;  Location: [facility];  Service: Thoracic;   Laterality: N/A;   Current Outpatient Medications on File Prior to Visit  Medication Sig Dispense Refill   acetaminophen (TYLENOL) 500 MG tablet Take 1,000 mg by mouth every 6 (six) hours as needed for moderate pain.     albuterol (VENTOLIN HFA) 108 (90 Base) MCG/ACT inhaler      diltiazem (CARDIZEM CD) 120 MG 24 hr capsule Take 1 capsule (120 mg total) by mouth daily. 90 capsule 3   FLUoxetine (PROZAC) 40 MG capsule TAKE 1 CAPSULE BY MOUTH EVERY DAY 90 capsule 2   gabapentin (NEURONTIN) 300 MG capsule TAKE 1 CAPSULE BY MOUTH THREE TIMES A DAY 270 capsule 2   glucose blood (ONETOUCH ULTRA) test strip TEST ONCE DAILY E11.42     ipratropium-albuterol (DUONEB) 0.5-2.5 (3) MG/3ML SOLN Take 3 mLs by nebulization every 6 (six) hours as needed. (Patient taking differently: Take 3 mLs by nebulization every 6 (six) hours as needed (shortness of breath or wheezing).) 360 mL 11   nitroGLYCERIN (NITROSTAT) 0.4 MG SL tablet Place 0.4 mg under the tongue every 5 (five) minutes as needed for chest pain.     oxyCODONE (ROXICODONE) 5 MG/5ML solution Take 5-10 mLs (5-10 mg total) by mouth every 3 (three) hours as needed (shortness of breath). 473 mL 0   senna (SENOKOT) 8.6 MG TABS tablet Take 1 tablet by mouth.     SORBITOL PO Take by mouth.     traZODone (DESYREL) 100 MG tablet Take 2 tablets (200 mg total) by mouth at bedtime as needed for sleep. 180 tablet 3   [DISCONTINUED] albuterol (VENTOLIN HFA) 108 (90 Base) MCG/ACT inhaler TAKE 2 PUFFS BY MOUTH EVERY 6 HOURS AS NEEDED FOR WHEEZE OR SHORTNESS OF BREATH     Current Facility-Administered Medications on File Prior to Visit  Medication Dose Route Frequency Provider Last Rate Last Admin   albuterol (PROVENTIL) (2.5 MG/3ML) 0.083% nebulizer solution 2.5 mg  2.5 mg Nebulization Once Delsa Grana, PA-C       Allergies  Allergen Reactions   Actos [Pioglitazone] Swelling    EDEMA REACTION UNSPECIFIED   Avandia [Rosiglitazone] Swelling    SWELLING REACTION  UNSPECIFIED    Dilaudid [Hydromorphone Hcl] Other (See Comments)    Made him crazy   Social History   Socioeconomic History   Marital status: Married    Spouse name: mary lou   Number of children: 3   Years of education: trade    Highest education level: Not on file  Occupational History   Occupation: truck Geophysicist/field seismologist    Comment: retired  Tobacco Use   Smoking status: Every Day    Packs/day: 3.00    Years: 54.00    Pack years: 162.00    Types: Cigarettes    Start date: 1963   Smokeless tobacco: Never   Tobacco comments:    1ppd as of 01/04/21  ep  Vaping Use   Vaping Use: Former  Substance and Sexual Activity   Alcohol use: No    Comment: no alcolol since 2013   Drug use: Yes    Types: Cocaine, Marijuana    Comment: quit 1990's   Sexual activity: Not Currently  Other Topics Concern   Not on file  Social History  Narrative   Lives in [place] with wife   She is his NOK   Was a truck driver for 96 yr, 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   Social Determinants of Health   Financial Resource Strain: Low Risk    Difficulty of Paying Living Expenses: Not hard at all  Food Insecurity: No Food Insecurity   Worried About Charity fundraiser in the Last Year: Never true   [place] in the Last Year: Never true  Transportation Needs: No Transportation Needs   Lack of Transportation (Medical): No   Lack of Transportation (Non-Medical): No  Physical Activity: Inactive   Days of Exercise per Week: 0 days   Minutes of Exercise per Session: 0 min  Stress: No Stress Concern Present   Feeling of Stress : Not at all  Social Connections: Moderately Isolated   Frequency of Communication with Friends and Family: Never   Frequency of Social Gatherings with Friends and Family: More than three times a week   Attends Religious Services: Never   Marine scientist or Organizations:  No   Attends Music therapist: Never   Marital Status: Married  Human resources officer Violence: Not At Risk   Fear of Current or Ex-Partner: No   Emotionally Abused: No   Physically Abused: No   Sexually Abused: No      Review of Systems  All other systems reviewed and are negative.     Objective:   Physical Exam Vitals reviewed.  Constitutional:      General: He is not in acute distress.    Appearance: He is ill-appearing. He is not toxic-appearing.  Cardiovascular:     Rate and Rhythm: Normal rate. Rhythm irregular.     Heart sounds: Normal heart sounds.  Pulmonary:     Effort: No tachypnea or respiratory distress.     Breath sounds: Decreased air movement present. Decreased breath sounds present. No wheezing, rhonchi or rales.  Musculoskeletal:     Cervical back: Neck supple.     Right lower leg: No edema.     Left lower leg: No edema.  Lymphadenopathy:     Cervical: No cervical adenopathy.  Neurological:     Mental Status: He is alert.          Assessment & Plan:   Chronic obstructive pulmonary disease, unspecified COPD type ([place]) - Plan: CBC with Differential/Platelet, COMPLETE METABOLIC PANEL WITH GFR  Irregularly irregular pulse rhythm - Plan: EKG 12-Lead  Controlled type 2 diabetes mellitus with complication, without long-term current use of insulin (HCC)  Primary cancer of left upper lobe of lung ([place])  Primary cancer of glottis (HCC) EKG today shows no evidence of atrial fibrillation.  COPD seems to be stable on Breztri.  He is only using his albuterol 1-2 times a day and he is no longer dependent on oxygen 24/7.  I am very happy for the patient.  I will get baseline lab work today to monitor his anemia as well as his renal function.  From the standpoint of his cancer, the patient no longer wants any additional treatment or testing.  He realizes that if he waits until symptoms develop it would likely be too late to treat any recurrence.  He is  willing to accept this.  He states that the treatment causes him significant pain  and discomfort and therefore he desires no additional treatment.  I will also check his blood sugar today with a hemoglobin A1c given the recent weight gain.  However the patient looks stronger than I have seen him in several months and I congratulated him on his improvement

## 2021-06-20 NOTE — Patient Instructions (Addendum)
Visit Information   Goals Addressed             This Visit's Progress    Track and Manage My Symptoms-COPD       Timeframe:  Long-Range Goal Priority:  High Start Date:    06/20/21                         Expected End Date:       12/21/21                Follow Up Date 10/04/21    - eliminate symptom triggers at home - follow rescue plan if symptoms flare-up - keep follow-up appointments    Why is this important?   Tracking your symptoms and other information about your health helps your doctor plan your care.  Write down the symptoms, the time of day, what you were doing and what medicine you are taking.  You will soon learn how to manage your symptoms.     Notes:        Patient Care Plan: General Pharmacy (Adult)     Problem Identified: Hypertension, Diabetes, COPD, and Depression   Priority: High  Onset Date: 06/20/2021     Long-Range Goal: Patient-Specific Goal   Start Date: 06/20/2021  Expected End Date: 12/21/2021  This Visit's Progress: On track  Priority: High  Note:   Current Barriers:  Unable to independently afford treatment regimen  Pharmacist Clinical Goal(s):  Patient will maintain control of BP and glucose as evidenced by home monitoring  through collaboration with PharmD and provider.   Interventions: 1:1 collaboration with [name], MD regarding development and update of comprehensive plan of care as evidenced by provider attestation and co-signature Inter-disciplinary care team collaboration (see longitudinal plan of care) Comprehensive medication review performed; medication list updated in electronic medical record  Hypertension  (Status:Goal on track: YES.)   Med Management Intervention: Meds reviewed/updated  (BP goal <140/90) -Controlled -Current treatment: Diltiazem 120mg  daily -Medications previously tried: Losartan  -Current home readings: not checking very often, "normal" to borderline low in office -Denies  hypotensive/hypertensive symptoms -Educated on BP goals and benefits of medications for prevention of heart attack, stroke and kidney damage; Daily salt intake goal < 2300 mg; Importance of home blood pressure monitoring; Symptoms of hypotension and importance of maintaining adequate hydration; -Counseled to monitor BP at home periodically, document, and provide log at future appointments -Recommended to continue current medication Contact providers with low BP or dizziness  Diabetes (A1c goal <6.5%) -Controlled -Current medications: None -Medications previously tried: pioglitazone (swelling)  -Current home glucose readings fasting glucose: "normal" post prandial glucose: not checking -Denies hypoglycemic/hyperglycemic symptoms -Blood sugar controlled, A1c did trend up, watch sugars and carbs -Educated on A1c and blood sugar goals; Benefits of routine self-monitoring of blood sugar; -Counseled to check feet daily and get yearly eye exams -Recommended to continue current management strategy  COPD (Goal: control symptoms and prevent exacerbations) -Controlled -Current treatment  Duoneb 0.5-2.5 BID Breztri twice daily -Medications previously tried: Spiriva ($$)   -Pulmonary function testing: Pulmonary Functions Testing Results:  No results found for: FEV1, FVC, FEV1FVC, TLC, DLCO  -Exacerbations requiring treatment in last 6 months: Yes, one -Patient denies consistent use of maintenance inhaler -Frequency of rescue inhaler use: daily Duonebs -Counseled on When to use rescue inhaler Differences between maintenance and rescue inhalers -Recently started on Breztri samples at discharge from hospital -Recommended to continue current medication Contact providers if  medication is expensive once he finishes with samples  Depression/Anxiety (Goal: Minimize symptoms) -Controlled -Current treatment: Trazodone 200mg  hs for sleep Fluoxetine 40mg  daily -Medications previously  tried/failed: none noted -PHQ9:  PHQ9 SCORE ONLY 04/20/2021 03/30/2021 08/11/2019  PHQ-9 Total Score 0 0 0   -Educated on Benefits of medication for symptom control -he reports sleep has improved and his mood is controlled. -Recommended to continue current medication  Patient Goals/Self-Care Activities Patient will:  - take medications as prescribed check blood pressure periodically, document, and provide at future appointments collaborate with provider on medication access solutions  Follow Up Plan: The care management team will reach out to the patient again over the next 180 days.        Patient verbalizes understanding of instructions provided today and agrees to view in [place].  Telephone follow up appointment with pharmacy team member scheduled for: 6 months  Edythe Clarity, [place]

## 2021-06-21 LAB — CBC WITH DIFFERENTIAL/PLATELET
Absolute Monocytes: 800 cells/uL (ref 200–950)
Basophils Absolute: 37 cells/uL (ref 0–200)
Basophils Relative: 0.4 %
Eosinophils Absolute: 322 cells/uL (ref 15–500)
Eosinophils Relative: 3.5 %
HCT: 39.2 % (ref 38.5–50.0)
Hemoglobin: 12.7 g/dL — ABNORMAL LOW (ref 13.2–17.1)
Lymphs Abs: 1950 cells/uL (ref 850–3900)
MCH: 30.8 pg (ref 27.0–33.0)
MCHC: 32.4 g/dL (ref 32.0–36.0)
MCV: 94.9 fL (ref 80.0–100.0)
MPV: 10.4 fL (ref 7.5–12.5)
Monocytes Relative: 8.7 %
Neutro Abs: 6090 cells/uL (ref 1500–7800)
Neutrophils Relative %: 66.2 %
Platelets: 238 10*3/uL (ref 140–400)
RBC: 4.13 10*6/uL — ABNORMAL LOW (ref 4.20–5.80)
RDW: 13.1 % (ref 11.0–15.0)
Total Lymphocyte: 21.2 %
WBC: 9.2 10*3/uL (ref 3.8–10.8)

## 2021-06-21 LAB — COMPLETE METABOLIC PANEL WITH GFR
AG Ratio: 1.9 (calc) (ref 1.0–2.5)
ALT: 4 U/L — ABNORMAL LOW (ref 9–46)
AST: 8 U/L — ABNORMAL LOW (ref 10–35)
Albumin: 4 g/dL (ref 3.6–5.1)
Alkaline phosphatase (APISO): 82 U/L (ref 35–144)
BUN: 23 mg/dL (ref 7–25)
CO2: 24 mmol/L (ref 20–32)
Calcium: 8.5 mg/dL — ABNORMAL LOW (ref 8.6–10.3)
Chloride: 105 mmol/L (ref 98–110)
Creat: 1.26 mg/dL (ref 0.70–1.28)
Globulin: 2.1 g/dL (calc) (ref 1.9–3.7)
Glucose, Bld: 122 mg/dL — ABNORMAL HIGH (ref 65–99)
Potassium: 4.4 mmol/L (ref 3.5–5.3)
Sodium: 137 mmol/L (ref 135–146)
Total Bilirubin: 0.3 mg/dL (ref 0.2–1.2)
Total Protein: 6.1 g/dL (ref 6.1–8.1)
eGFR: 61 mL/min/{1.73_m2} (ref >=60)

## 2021-06-22 ENCOUNTER — Telehealth: Payer: Self-pay | Admitting: *Deleted

## 2021-06-22 NOTE — Telephone Encounter (Signed)
Call placed to patient with lab results. While discussing results, patient wife states that she has a few concerns: Patient continues to have oxygen therapy at home that is no longer covered by [facility] or in use. Requested order to be faxed to [employer] to pick up DME. Order transcribed and placed on provider desk for signature.  States that provider inquired as to patient reflux medication. Patient has been using OTC Pepcid as needed for GERD.

## 2021-06-23 ENCOUNTER — Other Ambulatory Visit: Payer: Self-pay | Admitting: Family Medicine

## 2021-06-23 MED ORDER — FAMOTIDINE 40 MG/5ML PO SUSR: 40.0000 mg | Freq: Every day | ORAL | 11 refills | Status: DC

## 2021-06-30 ENCOUNTER — Other Ambulatory Visit: Payer: Self-pay | Admitting: *Deleted

## 2021-06-30 NOTE — Telephone Encounter (Signed)
Received call from patient wife.   Reports that patient requires refill on Oxycodone. States that he was advised PCP will take over prescription.   Ok to refill??

## 2021-07-03 MED ORDER — OXYCODONE HCL 5 MG/5ML PO SOLN: 5.0000 mg | ORAL | 0 refills | Status: DC | PRN

## 2021-07-04 ENCOUNTER — Telehealth: Payer: Self-pay | Admitting: *Deleted

## 2021-07-04 NOTE — Telephone Encounter (Signed)
Received request from pharmacy for PA on Oxycodone 5mg / 9mL  PA submitted via fax. Requested expedited review.   Dx: C34.11- non-small cell carcinoma, lung

## 2021-07-05 NOTE — Telephone Encounter (Signed)
Received PA determination.   PA approved through 11/04/2021.

## 2021-07-12 ENCOUNTER — Encounter: Payer: Self-pay | Admitting: *Deleted

## 2021-07-12 NOTE — Progress Notes (Signed)
This encounter was created in error - please disregard.

## 2021-07-20 ENCOUNTER — Telehealth: Payer: Self-pay | Admitting: Pharmacist

## 2021-07-20 NOTE — Progress Notes (Addendum)
    Chronic Care Management Pharmacy Assistant   Name: [name]  MRN: [id number] DOB: 25-Jun-1951  Reason for Encounter: General Disease State Call   Conditions to be addressed/monitored: Hypertension, Diabetes, COPD, and Depression  Recent office visits:  06/20/21 Dr. Dennard Schaumann For follow-up. CHANGED Atorvastatin to 20 mg daily.   Recent consult visits:  None since 06/15/21  Hospital visits:  None since 06/15/21  Medications: Outpatient Encounter Medications as of 07/20/2021  Medication Sig   acetaminophen (TYLENOL) 500 MG tablet Take 1,000 mg by mouth every 6 (six) hours as needed for moderate pain.   albuterol (VENTOLIN HFA) 108 (90 Base) MCG/ACT inhaler    atorvastatin (LIPITOR) 20 MG tablet Take 1 tablet (20 mg total) by mouth daily.   diltiazem (CARDIZEM CD) 120 MG 24 hr capsule Take 1 capsule (120 mg total) by mouth daily.   famotidine (PEPCID) 40 MG/5ML suspension Take 5 mLs (40 mg total) by mouth daily.   FLUoxetine (PROZAC) 40 MG capsule TAKE 1 CAPSULE BY MOUTH EVERY DAY   gabapentin (NEURONTIN) 300 MG capsule TAKE 1 CAPSULE BY MOUTH THREE TIMES A DAY   glucose blood (ONETOUCH ULTRA) test strip TEST ONCE DAILY E11.42   ipratropium-albuterol (DUONEB) 0.5-2.5 (3) MG/3ML SOLN Take 3 mLs by nebulization every 6 (six) hours as needed. (Patient taking differently: Take 3 mLs by nebulization every 6 (six) hours as needed (shortness of breath or wheezing).)   nitroGLYCERIN (NITROSTAT) 0.4 MG SL tablet Place 0.4 mg under the tongue every 5 (five) minutes as needed for chest pain.   oxyCODONE (ROXICODONE) 5 MG/5ML solution Take 5-10 mLs (5-10 mg total) by mouth every 3 (three) hours as needed (shortness of breath).   senna (SENOKOT) 8.6 MG TABS tablet Take 1 tablet by mouth.   SORBITOL PO Take by mouth.   traZODone (DESYREL) 100 MG tablet Take 2 tablets (200 mg total) by mouth at bedtime as needed for sleep.   Facility-Administered Encounter Medications as of 07/20/2021   Medication   albuterol (PROVENTIL) (2.5 MG/3ML) 0.083% nebulizer solution 2.5 mg   GEN CALL: Patients wife stated he does outside more and more and is becoming more active. She stated he does forget to take his medication sometimes but never any ongoing days. Patients wife stated he is happy with his inhalers, they are working very well. She stated he eats great, he doesn't over eat and choices healthier choices. She stated he drinks protein shakes sometimes as well.  She stated he drinks plenty of fluids.   Care Gaps:Not on the list/Patient is due for eye exam.   Star Rating Drugs: Atorvastatin 20 mg 06/20/21 90 DS.   Follow-Up:Pharmacist Review  Charlann Lange, [place] Pharmacist Assistant [phone]

## 2021-07-28 ENCOUNTER — Ambulatory Visit: Payer: PPO | Admitting: Podiatry

## 2021-07-28 ENCOUNTER — Encounter: Payer: Self-pay | Admitting: Podiatry

## 2021-07-28 ENCOUNTER — Other Ambulatory Visit: Payer: Self-pay

## 2021-07-28 DIAGNOSIS — N183 Chronic kidney disease, stage 3 unspecified: Secondary | ICD-10-CM

## 2021-07-28 DIAGNOSIS — M79676 Pain in unspecified toe(s): Secondary | ICD-10-CM | POA: Diagnosis not present

## 2021-07-28 DIAGNOSIS — M2010 Hallux valgus (acquired), unspecified foot: Secondary | ICD-10-CM | POA: Diagnosis not present

## 2021-07-28 DIAGNOSIS — N1832 Chronic kidney disease, stage 3b: Secondary | ICD-10-CM | POA: Diagnosis not present

## 2021-07-28 DIAGNOSIS — E1151 Type 2 diabetes mellitus with diabetic peripheral angiopathy without gangrene: Secondary | ICD-10-CM

## 2021-07-28 DIAGNOSIS — N179 Acute kidney failure, unspecified: Secondary | ICD-10-CM

## 2021-07-28 DIAGNOSIS — B351 Tinea unguium: Secondary | ICD-10-CM

## 2021-07-28 NOTE — Progress Notes (Signed)
This patient returns to my office for at risk foot care.  This patient requires this care by a professional since this patient will be at risk due to having diabetes.  History of throat cancer.  This patient is unable to cut nails himself since the patient cannot reach his nails.These nails are painful walking and wearing shoes.  This patient presents for at risk foot care today.  General Appearance  Alert, conversant and in no acute stress.  Vascular  Dorsalis pedis and posterior tibial  pulses are weakly  palpable  bilaterally.  Capillary return is within normal limits  bilaterally. Temperature is within normal limits  bilaterally.  Neurologic  Senn-Weinstein monofilament wire test diminished   bilaterally. Muscle power within normal limits bilaterally.  Nails Thick disfigured discolored nails with subungual debris  from hallux to fifth toes bilaterally. No evidence of bacterial infection or drainage bilaterally.  Orthopedic  No limitations of motion  feet .  No crepitus or effusions noted.  No bony pathology or digital deformities noted.HAV  B/L.  Skin  normotropic skin with no porokeratosis noted bilaterally.  No signs of infections or ulcers noted.     Onychomycosis  Pain in right toes  Pain in left toes  Consent was obtained for treatment procedures.   Mechanical debridement of nails 1-5  bilaterally performed with a nail nipper.  Filed with dremel without incident.    Return office visit    3 months                  Told patient to return for periodic foot care and evaluation due to potential at risk complications.   Gardiner Barefoot DPM

## 2021-08-03 ENCOUNTER — Telehealth: Payer: Self-pay | Admitting: Family Medicine

## 2021-08-03 NOTE — Telephone Encounter (Signed)
Patients wife left vm requesting a refill on his oxycodone.  Last seen 06/20/21 Last filled 07/03/2021  CB# [phone]

## 2021-08-04 ENCOUNTER — Other Ambulatory Visit: Payer: Self-pay | Admitting: Family Medicine

## 2021-08-04 MED ORDER — OXYCODONE HCL 5 MG/5ML PO SOLN: 5.0000 mg | ORAL | 0 refills | Status: DC | PRN

## 2021-09-04 ENCOUNTER — Other Ambulatory Visit: Payer: Self-pay | Admitting: *Deleted

## 2021-09-04 NOTE — Telephone Encounter (Signed)
Ok to refill??  Last office visit 06/20/2021.  Last refill 08/04/2021.

## 2021-09-05 MED ORDER — OXYCODONE HCL 5 MG/5ML PO SOLN: 5.0000 mg | ORAL | 0 refills | Status: DC | PRN

## 2021-09-07 ENCOUNTER — Ambulatory Visit (INDEPENDENT_AMBULATORY_CARE_PROVIDER_SITE_OTHER): Payer: PPO | Admitting: Family Medicine

## 2021-09-07 ENCOUNTER — Encounter: Payer: Self-pay | Admitting: Family Medicine

## 2021-09-07 ENCOUNTER — Other Ambulatory Visit: Payer: Self-pay

## 2021-09-07 VITALS — BP 124/68 | HR 77 | Temp 98.2°F | Resp 20 | Ht 71.0 in | Wt 167.0 lb

## 2021-09-07 DIAGNOSIS — J441 Chronic obstructive pulmonary disease with (acute) exacerbation: Secondary | ICD-10-CM

## 2021-09-07 MED ORDER — PREDNISONE 20 MG PO TABS: ORAL_TABLET | ORAL | 0 refills | Status: DC

## 2021-09-07 MED ORDER — LEVOFLOXACIN 500 MG PO TABS: 500.0000 mg | ORAL_TABLET | Freq: Every day | ORAL | 0 refills | Status: AC

## 2021-09-07 NOTE — Progress Notes (Signed)
Subjective:    Patient ID: [name], male    DOB: 1951/01/17, 70 y.o.   MRN: [id number]  HPI I sent the patient to the hospital at his last visit due to hypoxemic respiratory failure.  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  Sepsis at admission requiring peripheral pressors. He was sick for about 2 weeks with subjective fever and chills, cough and bringing up secretions.  He did follow-up with his primary care doctor-treated in the office and sent to the emergency department where he was admitted He was stabilized on antibiotics, pressors were weaned after 24 hours He was on antibiotics, IV steroids, bronchodilators He continues to stabilize however in the last 48 hours, started insisting about wanting to go home He states he just wants to go home and rest, he knows he is going to die.  Declining interventions     Consults: Palliative care medicine   Significant Diagnostic Studies: labs: Chest x-ray with multifocal infiltrates Chemistry within normal limits with elevated BUN/creatinine at baseline, leukocytosis stable   Treatments: antibiotics: Levaquin, steroids  Patient is here today for follow-up.  Chest x-ray obtained in the hospital showed multifocal pneumonia most pronounced in the right lower lobe.   However he looks much better than the last time I saw him.  He is resting comfortably in the exam room on 4 L.  He is not struggling to breathe.  He denies any chest pain.  He is not having to use any bronchodilators at home.  He is also not on steroids.  He took his last dose of Levaquin yesterday.  Hospice is involved however the patient has rallied and appears back to his baseline essentially aside from being on 2 additional liters of oxygen.  Wife states that he is not eating well or drinking well.  Blood pressure is still very low.  He is taking diltiazem for heart rate control at home as well as losartan for blood pressure control.  AT that time, my plan was: Breath sounds have improved dramatically from the last time I saw the patient.  I have asked to add an additional 3 days of Levaquin to complete 10 days total.  However patient has responded dramatically to antibiotics.  I have asked the patient to try to drink more fluids to get his blood pressure up.  I recommended Gatorade.  I have also asked the patient to discontinue losartan due to hypotension.  Check CBC and BMP.  Reassess next week or sooner if worsening.  04/07/21 No visits with results within 1 Week(s) from this visit.  Latest known visit with results is:  Office Visit on 06/20/2021  Component Date Value Ref Range Status   WBC 06/20/2021 9.2  3.8 - 10.8 Thousand/uL Final   RBC 06/20/2021 4.13 (A)  4.20 - 5.80 Million/uL Final   Hemoglobin 06/20/2021 12.7 (A)  13.2 - 17.1 g/dL Final   HCT 06/20/2021  39.2  38.5 - 50.0 % Final   MCV 06/20/2021 94.9  80.0 - 100.0 fL Final   MCH 06/20/2021 30.8  27.0 - 33.0 pg Final   MCHC 06/20/2021 32.4  32.0 - 36.0 g/dL Final   RDW 06/20/2021 13.1  11.0 - 15.0 % Final   Platelets 06/20/2021 238  140 - 400 Thousand/uL Final   MPV 06/20/2021 10.4  7.5 - 12.5 fL Final   Neutro Abs 06/20/2021 6,090  1,500 - 7,800 cells/uL Final   Lymphs Abs 06/20/2021 1,950  850 - 3,900 cells/uL Final   Absolute  Monocytes 06/20/2021 800  200 - 950 cells/uL Final   Eosinophils Absolute 06/20/2021 322  15 - 500 cells/uL Final   Basophils Absolute 06/20/2021 37  0 - 200 cells/uL Final   Neutrophils Relative % 06/20/2021 66.2  % Final   Total Lymphocyte 06/20/2021 21.2  % Final   Monocytes Relative 06/20/2021 8.7  % Final   Eosinophils Relative 06/20/2021 3.5  % Final   Basophils Relative 06/20/2021 0.4  % Final   Glucose, Bld 06/20/2021 122 (A)  65 - 99 mg/dL Final   Comment: .            Fasting reference interval . For someone without known diabetes, a glucose value between 100 and 125 mg/dL is consistent with prediabetes and should be confirmed with a follow-up test. .    BUN 06/20/2021 23  7 - 25 mg/dL Final   Creat 06/20/2021 1.26  0.70 - 1.28 mg/dL Final   eGFR 06/20/2021 61  > OR = 60 mL/min/1.38m Final   Comment: The eGFR is based on the CKD-EPI 2021 equation. To calculate  the new eGFR from a previous Creatinine or Cystatin C result, go to https://www.kidney.org/professionals/ kdoqi/gfr%5Fcalculator    BUN/Creatinine Ratio 082/95/6213NOT APPLICABLE  6 - 22 (calc) Final   Sodium 06/20/2021 137  135 - 146 mmol/L Final   Potassium 06/20/2021 4.4  3.5 - 5.3 mmol/L Final   Chloride 06/20/2021 105  98 - 110 mmol/L Final   CO2 06/20/2021 24  20 - 32 mmol/L Final   Calcium 06/20/2021 8.5 (A)  8.6 - 10.3 mg/dL Final   Total Protein 06/20/2021 6.1  6.1 - 8.1 g/dL Final   Albumin 06/20/2021 4.0  3.6 - 5.1 g/dL Final   Globulin 06/20/2021 2.1  1.9 - 3.7 g/dL (calc) Final   AG Ratio 06/20/2021 1.9  1.0 - 2.5 (calc) Final   Total Bilirubin 06/20/2021 0.3  0.2 - 1.2 mg/dL Final   Alkaline phosphatase (APISO) 06/20/2021 82  35 - 144 U/L Final   AST 06/20/2021 8 (A)  10 - 35 U/L Final   ALT 06/20/2021 4 (A)  9 - 46 U/L Final   Recent lab work showed improvement in his renal function back to his baseline of 1.4.  White blood cell count had improved from 17 down to 11.  However hemoglobin had  dropped from his baseline of 13 approximately a year ago to just over 11.  We had a discussion regarding this today.  The patient is too frail to tolerate an EGD or colonoscopy.  Therefore we have decided not to check his stool for blood.  They are interested in checking his B12 and iron levels in case this could be a nutritional deficiency in an effort to try to improve his hemoglobin and improve his quality of life.  He states that his breathing is at his baseline.  However he is wheezing more today  on exam than he was last time.  He also has diminished breath sounds throughout.  He denies any fevers or chills.  He has not been using his nebulizers at all.  He stated that he just started back on Breztri. At that time, my plan was: At this point, I feel the patient is back to his baseline.  However his baseline status is poor.  He appears to be malnourished.  Therefore I recommended 1 to 2 cans of Ensure a day to try to improve protein calorie malnutrition.  He also has mildly anemic so we will check a B12 level and an iron level to see if there are any nutritional deficiencies that may be contributing to his anemia.  I feel that he is too frail to undergo any type of diagnostic procedure such as an EGD or colonoscopy at the present time and therefore I will not check his stool for blood.  I explained this to the patient and his wife and they are in agreement.  I believe the pneumonia has resolved and no additional antibiotics are necessary however I did asked the patient to liberalize his albuterol nebulizer therapy and start doing it at least twice a day in an effort to improve his dyspnea  06/20/21 Since I last saw the patient, he has been discharged from hospice.  He has continued to rally and improve and is no longer wearing his oxygen 24/7.  In fact, when the hospice nurse recently checked on him he was outside working and has even been caught mopping the floor on occasion!  He is also eating better and  has gained some weight: Wt Readings from Last 3 Encounters:  06/20/21 167 lb (75.8 kg)  04/07/21 153 lb (69.4 kg)  03/30/21 152 lb (68.9 kg)   As result, he no longer qualified for hospice care and has been returned to my care.  He is here today for follow-up.  He is consistently using his Breztri  twice daily.  He states that his breathing is doing about as good as it is been in the last few years.  He denies any hemoptysis or fevers or chills.  His blood pressure today is well controlled although his heart rate is irregular on my exam.  However I did an EKG today to rule out any underlying atrial fibrillation.  The patient was found to be in normal sinus rhythm with a first-degree AV block but no evidence of ischemia or infarction or atrial fibrillation.  He continues to have dysphagia but he does not want any further diagnostic work-up.  In fact he is decided not to return to see his cancer team.  He has decided that if the cancer returns he does not want any additional treatment.  He states that at this point he is willing to let nature take its course.  Although he has been dismissed from hospice care, he does not want any residual treatment or testing for his cancer.  He prefers to focus on quality of life and to manage the other issues as they present themselves.  AT that time, my plan was:  EKG today shows no evidence of atrial fibrillation.  COPD seems to be stable on Breztri.  He is only using his albuterol 1-2 times a day and he is no longer dependent on oxygen 24/7.  I am very happy for the patient.  I will get baseline lab work today to monitor his anemia as well as his renal function.  From  the standpoint of his cancer, the patient no longer wants any additional treatment or testing.  He realizes that if he waits until symptoms develop it would likely be too late to treat any recurrence.  He is willing to accept this.  He states that the treatment causes him significant pain and discomfort and  therefore he desires no additional treatment.  I will also check his blood sugar today with a hemoglobin A1c given the recent weight gain.  However the patient looks stronger than I have seen him in several months and I congratulated him on his improvement  09/07/21 Patient had [place] in mid to late September.  His wife states that ever since, he has not fully recovered.  He reports shortness of breath and increasing oxygen requirement.  Today on examination, room air oxygen levels 94%.  He is not struggling to breathe.  There is no tachypnea.  He is compliant using breztri.  He denies any hemoptysis.  He does report purulent sputum.  He is having to use his albuterol more frequently. Past Medical History:  Diagnosis Date   Allergy    Anemia    Anginal pain ([place])    Arthritis    "hands"   Asthma    Back pain    4 deteriorating disc and receives an injection q3-73mo;has been doing this for about 483yr  Blood transfusion    as a child   Bronchitis    Cancer ([facility] 2013   Non-small cell lung cancer   Chronic kidney disease    acute kidney failure post surgery   COPD (chronic obstructive pulmonary disease) (HCC)    uses Albuterol and Spiriva daily   Coronary artery disease    has 1 stent   Depression    takes Prozac daily   Emphysema    sees Dr.Ramaswami for this   Family history of adverse reaction to anesthesia    mother was hard to wake up sometimes   GERD (gastroesophageal reflux disease)    takes Prilosec daily, EGD nml 06/2017   Glaucoma    hx of   H/O hiatal hernia    Hypertension    takes  HYzaar daily   Insomnia    takes Trazodone nightly   Lung mass    right upper lobe   [facility]   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   Obesity    Parathyroid disease ([facility]   Periodic limb movements of sleep    Peripheral neuropathy    Pneumonia    hx of' 70 yo,rd' last time in 2013   Productive cough    white in color but no odor    Shortness of breath    with exertion    Sleep apnea    uses BiPaP; "no longer have sleep apnea since I have lost over 100 lbs"   Syncope and collapse 05/26/2013   Thyroid disease    Type II diabetes mellitus ([facility]   takes Metformin bid and Novolog and Lantus daily   Past Surgical History:  Procedure Laterality Date   CARDIAC CATHETERIZATION  2006/2008/2009   CARPAL TUNNEL RELEASE     bilateral   COLONOSCOPY     CORONARY ANGIOPLASTY WITH STENT PLACEMENT     1 stent   ELBOW SURGERY     ulnar nerve; left   ESOPHAGOGASTRODUODENOSCOPY     EYE SURGERY     pt. denies   FUDUCIAL PLACEMENT  Left 08/24/2016   Procedure: PLACEMENT OF FUDUCIAL;  Surgeon: Melrose Nakayama, MD;  Location: [facility];  Service: Thoracic;  Laterality: Left;   INGUINAL HERNIA REPAIR  1990's   double inguinal   LOBECTOMY  03/17/2012   upper right side with resection   MICROLARYNGOSCOPY WITH CO2 LASER AND EXCISION OF VOCAL CORD LESION N/A 06/28/2017   Procedure: MICROLARYNGOSCOPY WITH BIOPSY;  Surgeon: Melida Quitter, MD;  Location: [facility];  Service: ENT;  Laterality: N/A;   MICROLARYNGOSCOPY WITH CO2 LASER AND EXCISION OF VOCAL CORD LESION N/A 07/19/2017   Procedure: MICROLARYNGOSCOPY WITH CO2 LASER AND EXCISION OF VOCAL CORD LESION;  Surgeon: Melida Quitter, MD;  Location: [facility];  Service: ENT;  Laterality: N/A;  MICRO DIRECT LARYNGOSCOPY WITH CO2 LASER VOCAL CORD STRIPPING/POSS JET VENTURI VENTILATION   REFRACTIVE SURGERY     bilaterally   ROTATOR CUFF REPAIR     left   VIDEO BRONCHOSCOPY WITH ENDOBRONCHIAL NAVIGATION N/A 08/24/2016   Procedure: VIDEO BRONCHOSCOPY WITH ENDOBRONCHIAL NAVIGATION;  Surgeon: Melrose Nakayama, MD;  Location[facility];  Service: Thoracic;  Laterality: N/A;   Current Outpatient Medications on File Prior to Visit  Medication Sig Dispense Refill   acetaminophen (TYLENOL) 500 MG tablet Take 1,000 mg by mouth every 6 (six) hours as needed for moderate pain.     albuterol (VENTOLIN HFA) 108  (90 Base) MCG/ACT inhaler      atorvastatin (LIPITOR) 20 MG tablet Take 1 tablet (20 mg total) by mouth daily. 90 tablet 3   diltiazem (CARDIZEM CD) 120 MG 24 hr capsule Take 1 capsule (120 mg total) by mouth daily. 90 capsule 3   famotidine (PEPCID) 40 MG/5ML suspension Take 5 mLs (40 mg total) by mouth daily. 150 mL 11   FLUoxetine (PROZAC) 40 MG capsule TAKE 1 CAPSULE BY MOUTH EVERY DAY 90 capsule 2   gabapentin (NEURONTIN) 300 MG capsule TAKE 1 CAPSULE BY MOUTH THREE TIMES A DAY 270 capsule 2   glucose blood (ONETOUCH ULTRA) test strip TEST ONCE DAILY E11.42     ipratropium-albuterol (DUONEB) 0.5-2.5 (3) MG/3ML SOLN Take 3 mLs by nebulization every 6 (six) hours as needed. (Patient taking differently: Take 3 mLs by nebulization every 6 (six) hours as needed (shortness of breath or wheezing).) 360 mL 11   nitroGLYCERIN (NITROSTAT) 0.4 MG SL tablet Place 0.4 mg under the tongue every 5 (five) minutes as needed for chest pain.     oxyCODONE (ROXICODONE) 5 MG/5ML solution Take 5-10 mLs (5-10 mg total) by mouth every 3 (three) hours as needed (shortness of breath). 473 mL 0   senna (SENOKOT) 8.6 MG TABS tablet Take 1 tablet by mouth.     SORBITOL PO Take by mouth.     traZODone (DESYREL) 100 MG tablet Take 2 tablets (200 mg total) by mouth at bedtime as needed for sleep. 180 tablet 3   Current Facility-Administered Medications on File Prior to Visit  Medication Dose Route Frequency Provider Last Rate Last Admin   albuterol (PROVENTIL) (2.5 MG/3ML) 0.083% nebulizer solution 2.5 mg  2.5 mg Nebulization Once Delsa Grana, PA-C       Allergies  Allergen Reactions   Actos [Pioglitazone] Swelling    EDEMA REACTION UNSPECIFIED   Avandia [Rosiglitazone] Swelling    SWELLING REACTION UNSPECIFIED    Dilaudid [Hydromorphone Hcl] Other (See Comments)    Made him crazy   Social History   Socioeconomic History   Marital status: Married    Spouse name: mary lou  Number of children: 3   Years of  education: trade    Highest education level: Not on file  Occupational History   Occupation: truck driver    Comment: retired  Tobacco Use   Smoking status: Every Day    Packs/day: 3.00    Years: 54.00    Pack years: 162.00    Types: Cigarettes    Start date: 1963   Smokeless tobacco: Never   Tobacco comments:    1ppd as of 01/04/21  ep  Vaping Use   Vaping Use: Former  Substance and Sexual Activity   Alcohol use: No    Comment: no alcolol since 2013   Drug use: Yes    Types: Cocaine, Marijuana    Comment: quit 1990's   Sexual activity: Not Currently  Other Topics Concern   Not on file  Social History Narrative   Lives in [place] with wife   She is his NOK   Was a truck driver for 21 yr, 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   Social Determinants of Health   Financial Resource Strain: Low Risk    Difficulty of Paying Living Expenses: Not hard at all  Food Insecurity: No Food Insecurity   Worried About Charity fundraiser in the Last Year: Never true   [place] in the Last Year: Never true  Transportation Needs: No Transportation Needs   Lack of Transportation (Medical): No   Lack of Transportation (Non-Medical): No  Physical Activity: Inactive   Days of Exercise per Week: 0 days   Minutes of Exercise per Session: 0 min  Stress: No Stress Concern Present   Feeling of Stress : Not at all  Social Connections: Moderately Isolated   Frequency of Communication with Friends and Family: Never   Frequency of Social Gatherings with Friends and Family: More than three times a week   Attends Religious Services: Never   Marine scientist or Organizations: No   Attends Music therapist: Never   Marital Status: Married  Human resources officer Violence: Not At Risk   Fear of Current or Ex-Partner: No   Emotionally Abused: No   Physically Abused: No   Sexually  Abused: No      Review of Systems  All other systems reviewed and are negative.     Objective:   Physical Exam Vitals reviewed.  Constitutional:      General: He is not in acute distress.    Appearance: He is not ill-appearing or toxic-appearing.  Cardiovascular:     Rate and Rhythm: Normal rate. Rhythm irregular.     Heart sounds: Normal heart sounds.  Pulmonary:     Effort: No tachypnea or respiratory distress.     Breath sounds: Decreased air movement present. Decreased breath sounds, wheezing and rales present. No rhonchi.  Musculoskeletal:     Cervical back: Neck supple.     Right lower leg: No edema.     Left lower leg: No edema.  Lymphadenopathy:     Cervical: No cervical adenopathy.  Neurological:     Mental Status: He is alert.          Assessment & Plan:  COPD exacerbation ([employer]) Patient has pronounced left basilar crackles and rails.  Begin Levaquin 500 mg daily for 7 days, prednisone taper pack, and albuterol 2 puffs every 6  hours as needed and reassess next week if no better or sooner if worse.  Recommended a chest x-ray which the patient declined due to his previous decision not to pursue any further imaging.

## 2021-09-08 ENCOUNTER — Telehealth: Payer: Self-pay | Admitting: *Deleted

## 2021-09-08 NOTE — Telephone Encounter (Signed)
Received request from pharmacy for PA on Oxycodone 5mg / 37mL   PA submitted via fax for 2023.    Dx: C34.11- non-small cell carcinoma, lung  Patient has dysphagia and is unable to swallow pills.

## 2021-09-08 NOTE — Telephone Encounter (Signed)
Patient and wife in office and reports that Tier Exception has been started for [employer].   Received fax from [employer]. Tier Exception denied.  There requested drug cannot be approved for a tier exception because it is already at the lowest possible tier.

## 2021-09-15 NOTE — Telephone Encounter (Signed)
PA dismissed as new PA cannot be started until 30 days prior to current PA end.

## 2021-10-04 ENCOUNTER — Other Ambulatory Visit: Payer: Self-pay

## 2021-10-04 NOTE — Telephone Encounter (Signed)
LOV 09/07/21 Last refill 09/05/21, 426mL, 0 refills  Please review, thanks!

## 2021-10-05 MED ORDER — OXYCODONE HCL 5 MG/5ML PO SOLN: 5.0000 mg | ORAL | 0 refills | Status: DC | PRN

## 2021-10-10 DIAGNOSIS — Z23 Encounter for immunization: Secondary | ICD-10-CM | POA: Diagnosis not present

## 2021-10-27 ENCOUNTER — Encounter: Payer: Self-pay | Admitting: Podiatry

## 2021-10-27 ENCOUNTER — Ambulatory Visit: Payer: PPO | Admitting: Podiatry

## 2021-10-27 ENCOUNTER — Other Ambulatory Visit: Payer: Self-pay

## 2021-10-27 DIAGNOSIS — M79676 Pain in unspecified toe(s): Secondary | ICD-10-CM

## 2021-10-27 DIAGNOSIS — N179 Acute kidney failure, unspecified: Secondary | ICD-10-CM

## 2021-10-27 DIAGNOSIS — N1832 Chronic kidney disease, stage 3b: Secondary | ICD-10-CM

## 2021-10-27 DIAGNOSIS — E1151 Type 2 diabetes mellitus with diabetic peripheral angiopathy without gangrene: Secondary | ICD-10-CM

## 2021-10-27 DIAGNOSIS — B351 Tinea unguium: Secondary | ICD-10-CM

## 2021-10-27 DIAGNOSIS — N183 Chronic kidney disease, stage 3 unspecified: Secondary | ICD-10-CM

## 2021-10-27 NOTE — Progress Notes (Signed)
This patient returns to my office for at risk foot care.  This patient requires this care by a professional since this patient will be at risk due to having diabetes.  History of throat cancer.  This patient is unable to cut nails himself since the patient cannot reach his nails.These nails are painful walking and wearing shoes.  This patient presents for at risk foot care today.  General Appearance  Alert, conversant and in no acute stress.  Vascular  Dorsalis pedis and posterior tibial  pulses are weakly  palpable  bilaterally.  Capillary return is within normal limits  bilaterally. Temperature is within normal limits  bilaterally.  Neurologic  Senn-Weinstein monofilament wire test diminished   bilaterally. Muscle power within normal limits bilaterally.  Nails Thick disfigured discolored nails with subungual debris  from hallux to fifth toes bilaterally. No evidence of bacterial infection or drainage bilaterally.  Orthopedic  No limitations of motion  feet .  No crepitus or effusions noted.  No bony pathology or digital deformities noted.HAV  B/L.  Skin  normotropic skin with no porokeratosis noted bilaterally.  No signs of infections or ulcers noted.     Onychomycosis  Pain in right toes  Pain in left toes  Consent was obtained for treatment procedures.   Mechanical debridement of nails 1-5  bilaterally performed with a nail nipper.  Filed with dremel without incident.    Return office visit    3 months                  Told patient to return for periodic foot care and evaluation due to potential at risk complications.   Gardiner Barefoot DPM

## 2021-11-01 ENCOUNTER — Other Ambulatory Visit: Payer: Self-pay

## 2021-11-01 NOTE — Telephone Encounter (Signed)
Pt's wife called to ask for refill on oxycodone  LOV 10/27/21 Last refill 10/05/21, 438mL, 0 refills  Please review, thanks!

## 2021-11-02 MED ORDER — OXYCODONE HCL 5 MG/5ML PO SOLN
5.0000 mg | ORAL | 0 refills | Status: DC | PRN
Start: 2021-11-02 — End: 2021-12-05

## 2021-12-05 ENCOUNTER — Other Ambulatory Visit: Payer: Self-pay

## 2021-12-05 MED ORDER — OXYCODONE HCL 5 MG/5ML PO SOLN: 5.0000 mg | ORAL | 0 refills | Status: DC | PRN

## 2021-12-05 NOTE — Telephone Encounter (Signed)
LOV 09/07/21 Last refill 11/02/21, 473ML, 0 refills  Please review, thanks!

## 2021-12-07 NOTE — Telephone Encounter (Signed)
Oxycodone refill request.  Last filled This encounter was created in error - please disregard.

## 2021-12-18 DIAGNOSIS — E1165 Type 2 diabetes mellitus with hyperglycemia: Secondary | ICD-10-CM | POA: Diagnosis not present

## 2021-12-18 DIAGNOSIS — E1142 Type 2 diabetes mellitus with diabetic polyneuropathy: Secondary | ICD-10-CM | POA: Diagnosis not present

## 2021-12-18 DIAGNOSIS — N1831 Chronic kidney disease, stage 3a: Secondary | ICD-10-CM | POA: Diagnosis not present

## 2021-12-22 ENCOUNTER — Other Ambulatory Visit: Payer: Self-pay

## 2021-12-22 MED ORDER — TRAZODONE HCL 100 MG PO TABS: 200.0000 mg | ORAL_TABLET | Freq: Every evening | ORAL | 3 refills | Status: DC | PRN

## 2021-12-30 ENCOUNTER — Other Ambulatory Visit: Payer: Self-pay | Admitting: Family Medicine

## 2022-01-01 ENCOUNTER — Other Ambulatory Visit: Payer: Self-pay

## 2022-01-01 MED ORDER — OXYCODONE HCL 5 MG/5ML PO SOLN: 5.0000 mg | ORAL | 0 refills | Status: DC | PRN

## 2022-01-01 NOTE — Telephone Encounter (Signed)
LOV 09/07/21 Last refill 06/06/21, #90, 2 refills  Please review, thanks!

## 2022-01-01 NOTE — Telephone Encounter (Signed)
LOV 09/07/21 Last refill 12/05/21  Please review, thanks!

## 2022-01-03 ENCOUNTER — Encounter: Payer: Self-pay | Admitting: Interventional Cardiology

## 2022-01-03 ENCOUNTER — Ambulatory Visit: Payer: PPO | Admitting: Interventional Cardiology

## 2022-01-03 ENCOUNTER — Other Ambulatory Visit: Payer: Self-pay

## 2022-01-03 VITALS — BP 96/60 | HR 79 | Ht 71.0 in | Wt 161.9 lb

## 2022-01-03 DIAGNOSIS — Z9989 Dependence on other enabling machines and devices: Secondary | ICD-10-CM

## 2022-01-03 DIAGNOSIS — Z794 Long term (current) use of insulin: Secondary | ICD-10-CM | POA: Diagnosis not present

## 2022-01-03 DIAGNOSIS — R55 Syncope and collapse: Secondary | ICD-10-CM | POA: Diagnosis not present

## 2022-01-03 DIAGNOSIS — E1159 Type 2 diabetes mellitus with other circulatory complications: Secondary | ICD-10-CM

## 2022-01-03 DIAGNOSIS — I10 Essential (primary) hypertension: Secondary | ICD-10-CM | POA: Diagnosis not present

## 2022-01-03 DIAGNOSIS — I2510 Atherosclerotic heart disease of native coronary artery without angina pectoris: Secondary | ICD-10-CM

## 2022-01-03 DIAGNOSIS — J441 Chronic obstructive pulmonary disease with (acute) exacerbation: Secondary | ICD-10-CM | POA: Diagnosis not present

## 2022-01-03 DIAGNOSIS — G4733 Obstructive sleep apnea (adult) (pediatric): Secondary | ICD-10-CM

## 2022-01-03 MED ORDER — ATORVASTATIN CALCIUM 20 MG PO TABS: 20.0000 mg | ORAL_TABLET | Freq: Every day | ORAL | 3 refills | Status: DC

## 2022-01-03 NOTE — Patient Instructions (Signed)
Medication Instructions:  ?1) RESTART Atorvastatin 20mg  once daily ? ?*If you need a refill on your cardiac medications before your next appointment, please call your pharmacy* ? ? ?Lab Work: ?Liver and Lipid in 6-8 weeks.  You will need to be fasting for these labs (nothing to eat or drink after midnight except water and black coffee). ? ?If you have labs (blood work) drawn today and your tests are completely normal, you will receive your results only by: ?MyChart Message (if you have MyChart) OR ?A paper copy in the mail ?If you have any lab test that is abnormal or we need to change your treatment, we will call you to review the results. ? ? ?Testing/Procedures: ?None ? ? ?Follow-Up: ?At [facility], you and your health needs are our priority.  As part of our continuing mission to provide you with exceptional heart care, we have created designated Provider Care Teams.  These Care Teams include your primary Cardiologist (physician) and Advanced Practice Providers (APPs -  Physician Assistants and Nurse Practitioners) who all work together to provide you with the care you need, when you need it. ? ?We recommend signing up for the patient portal called "MyChart".  Sign up information is provided on this After Visit Summary.  MyChart is used to connect with patients for Virtual Visits (Telemedicine).  Patients are able to view lab/test results, encounter notes, upcoming appointments, etc.  Non-urgent messages can be sent to your provider as well.   ?To learn more about what you can do with MyChart, go to [email or web address].   ? ?Your next appointment:   ?1 year(s) ? ?The format for your next appointment:   ?In Person ? ?Provider:   ?Sinclair Grooms, MD   ? ? ?Other Instructions ?  ?

## 2022-01-03 NOTE — Progress Notes (Signed)
Cardiology Office Note:    Date:  01/03/2022   ID:  BRENDT DIBLE, DOB [birth year], MRN [id number]  PCP:  [name], MD  Cardiologist:  Sinclair Grooms, MD   Referring MD: [name], MD   No chief complaint on file.   History of Present Illness:    [name] is a 71 y.o. male with a hx of CAD with RCA DES (remote), lung cancer status post partial right lung resection, hypertension, tobacco abuse, COPD, hyperlipidemia, and essential hypertension   He was on hospice but significantly improved and it was discontinued.  While on hospice many of his medications were discontinued including atorvastatin.  He has no chest discomfort, orthostatic dizziness, orthopnea, PND, peripheral edema, or other complaints.  He has not needed nitroglycerin.  Past Medical History:  Diagnosis Date   Allergy    Anemia    Anginal pain ([place])    Arthritis    "hands"   Asthma    Back pain    4 deteriorating disc and receives an injection q3-70mon;has been doing this for about 40yrs   Blood transfusion    as a child   Bronchitis    Cancer ([place]) dx'd 2013   Non-small cell lung cancer   Chronic kidney disease    acute kidney failure post surgery   COPD (chronic obstructive pulmonary disease) (HCC)    uses Albuterol and Spiriva daily   Coronary artery disease    has 1 stent   Depression    takes Prozac daily   Emphysema    sees Dr.Ramaswami for this   Family history of adverse reaction to anesthesia    mother was hard to wake up sometimes   GERD (gastroesophageal reflux disease)    takes Prilosec daily, EGD nml 06/2017   Glaucoma    hx of   H/O hiatal hernia    Hypertension    takes  HYzaar daily   Insomnia    takes Trazodone nightly   Lung mass    right upper lobe   Myocardial infarct [facility])    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   Obesity    Parathyroid disease ([place])    Periodic limb movements of sleep    Peripheral neuropathy     Pneumonia    hx of' 71 yo,rd' last time in 2013   Productive cough    white in color but no odor   Shortness of breath    with exertion    Sleep apnea    uses BiPaP; "no longer have sleep apnea since I have lost over 100 lbs"   Syncope and collapse 05/26/2013   Thyroid disease    Type II diabetes mellitus ([place])    takes Metformin bid and Novolog and Lantus daily    Past Surgical History:  Procedure Laterality Date   CARDIAC CATHETERIZATION  2006/2008/2009   CARPAL TUNNEL RELEASE     bilateral   COLONOSCOPY     CORONARY ANGIOPLASTY WITH STENT PLACEMENT     1 stent   ELBOW SURGERY     ulnar nerve; left   ESOPHAGOGASTRODUODENOSCOPY     EYE SURGERY     pt. denies   FUDUCIAL PLACEMENT Left 08/24/2016   Procedure: PLACEMENT OF FUDUCIAL;  Surgeon: Melrose Nakayama, MD;  Location: [place];  Service: Thoracic;  Laterality: Left;   INGUINAL HERNIA REPAIR  1990's   double inguinal   LOBECTOMY  03/17/2012  upper right side with resection   MICROLARYNGOSCOPY WITH CO2 LASER AND EXCISION OF VOCAL CORD LESION N/A 06/28/2017   Procedure: MICROLARYNGOSCOPY WITH BIOPSY;  Surgeon: Melida Quitter, MD;  Location: [place];  Service: ENT;  Laterality: N/A;   MICROLARYNGOSCOPY WITH CO2 LASER AND EXCISION OF VOCAL CORD LESION N/A 07/19/2017   Procedure: MICROLARYNGOSCOPY WITH CO2 LASER AND EXCISION OF VOCAL CORD LESION;  Surgeon: Melida Quitter, MD;  Location: [place];  Service: ENT;  Laterality: N/A;  MICRO DIRECT LARYNGOSCOPY WITH CO2 LASER VOCAL CORD STRIPPING/POSS JET VENTURI VENTILATION   REFRACTIVE SURGERY     bilaterally   ROTATOR CUFF REPAIR     left   VIDEO BRONCHOSCOPY WITH ENDOBRONCHIAL NAVIGATION N/A 08/24/2016   Procedure: VIDEO BRONCHOSCOPY WITH ENDOBRONCHIAL NAVIGATION;  Surgeon: Melrose Nakayama, MD;  [facility];  Service: Thoracic;  Laterality: N/A;    Current Medications: Current Meds  Medication Sig   acetaminophen (TYLENOL) 500 MG tablet Take 1,000 mg by mouth every 6 (six)  hours as needed for moderate pain.   albuterol (VENTOLIN HFA) 108 (90 Base) MCG/ACT inhaler    FLUoxetine (PROZAC) 40 MG capsule TAKE 1 CAPSULE BY MOUTH EVERY DAY   gabapentin (NEURONTIN) 300 MG capsule TAKE 1 CAPSULE BY MOUTH THREE TIMES A DAY   glucose blood (ONETOUCH ULTRA) test strip TEST ONCE DAILY E11.42   ipratropium-albuterol (DUONEB) 0.5-2.5 (3) MG/3ML SOLN Take 3 mLs by nebulization every 6 (six) hours as needed.   nitroGLYCERIN (NITROSTAT) 0.4 MG SL tablet Place 0.4 mg under the tongue every 5 (five) minutes as needed for chest pain.   oxyCODONE-acetaminophen (ROXICET) 5-325 MG/5ML solution Take 5 mLs by mouth 2 (two) times daily.   senna (SENOKOT) 8.6 MG TABS tablet Take 1 tablet by mouth.   SORBITOL PO Take by mouth.   traZODone (DESYREL) 100 MG tablet Take 2 tablets (200 mg total) by mouth at bedtime as needed for sleep.   Current Facility-Administered Medications for the 01/03/22 encounter (Office Visit) with Belva Crome, MD  Medication   albuterol (PROVENTIL) (2.5 MG/3ML) 0.083% nebulizer solution 2.5 mg     Allergies:   Actos [pioglitazone], Avandia [rosiglitazone], Hydromorphone, and Dilaudid [hydromorphone hcl]   Social History   Socioeconomic History   Marital status: Married    Spouse name: mary lou   Number of children: 3   Years of education: trade    Highest education level: Not on file  Occupational History   Occupation: truck driver    Comment: retired  Tobacco Use   Smoking status: Every Day    Packs/day: 3.00    Years: 54.00    Pack years: 162.00    Types: Cigarettes    Start date: 1963   Smokeless tobacco: Never   Tobacco comments:    1ppd as of 01/04/21  ep  Vaping Use   Vaping Use: Former  Substance and Sexual Activity   Alcohol use: No    Comment: no alcolol since 2013   Drug use: Yes    Types: Cocaine, Marijuana    Comment: quit 1990's   Sexual activity: Not Currently  Other Topics Concern   Not on file  Social History Narrative    Lives in [place] with wife   She is his NOK   Was a truck driver for 96 yr, 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  Social Determinants of Health   Financial Resource Strain: Low Risk    Difficulty of Paying Living Expenses: Not hard at all  Food Insecurity: No Food Insecurity   Worried About Charity fundraiser in the Last Year: Never true   [place] in the Last Year: Never true  Transportation Needs: No Transportation Needs   Lack of Transportation (Medical): No   Lack of Transportation (Non-Medical): No  Physical Activity: Inactive   Days of Exercise per Week: 0 days   Minutes of Exercise per Session: 0 min  Stress: No Stress Concern Present   Feeling of Stress : Not at all  Social Connections: Moderately Isolated   Frequency of Communication with Friends and Family: Never   Frequency of Social Gatherings with Friends and Family: More than three times a week   Attends Religious Services: Never   Marine scientist or Organizations: No   Attends Music therapist: Never   Marital Status: Married     Family History: The patient's family history includes COPD in his mother; Diabetes in his father and mother; Heart attack in his father; Hypertension in his mother. There is no history of Anesthesia problems, Hypotension, Malignant hyperthermia, or Pseudochol deficiency.  ROS:   Please see the history of present illness.    With his cancer he has lost more than 150 pounds.  He has had radiation which is left him with chronic hoarseness and difficulty swallowing.  All other systems reviewed and are negative.  EKGs/Labs/Other Studies Reviewed:    The following studies were reviewed today: No recent imaging since vascular Doppler studies performed in 2017.  Last echo 2014.  EKG:  EKG most recently performed June 20, 2021 demonstrating normal sinus rhythm with  first-degree AV block but otherwise unremarkable.  Recent Labs: 03/20/2021: B Natriuretic Peptide 145.5 03/21/2021: Magnesium 2.5 06/20/2021: ALT 4; BUN 23; Creat 1.26; Hemoglobin 12.7; Platelets 238; Potassium 4.4; Sodium 137  Recent Lipid Panel    Component Value Date/Time   CHOL 119 03/29/2020 0918   TRIG 87 03/29/2020 0918   HDL 39 (L) 03/29/2020 0918   CHOLHDL 3.1 03/29/2020 0918   VLDL 20 05/10/2017 0850   LDLCALC 63 03/29/2020 0918    Physical Exam:    VS:  BP 96/60    Pulse 79    Ht 5\' 11"  (1.803 m)    Wt 161 lb 14.4 oz (73.4 kg)    SpO2 95%    BMI 22.58 kg/m     Repeated and documented to be 120/70 mmHg in the right arm with the patient sitting.  Wt Readings from Last 3 Encounters:  01/03/22 161 lb 14.4 oz (73.4 kg)  09/07/21 167 lb (75.8 kg)  06/20/21 167 lb (75.8 kg)     GEN: Chronically ill appearing. No acute distress HEENT: Normal NECK: No JVD. LYMPHATICS: No lymphadenopathy CARDIAC: No murmur. RRR no gallop, or edema. VASCULAR:  Normal Pulses. No bruits. RESPIRATORY:  Clear to auscultation without rales, wheezing or rhonchi  ABDOMEN: Soft, non-tender, non-distended, No pulsatile mass, MUSCULOSKELETAL: No deformity  SKIN: Warm and dry NEUROLOGIC:  Alert and oriented x 3 PSYCHIATRIC:  Normal affect   ASSESSMENT:    1. Neurocardiogenic syncope   2. Coronary artery disease involving native coronary artery of native heart without angina pectoris   3. OSA on CPAP   4. Essential hypertension   5. Type 2 diabetes mellitus with other circulatory complication, with long-term current use of insulin (HCC)  6. COPD exacerbation ([place])    PLAN:    In order of problems listed above:  No recurrence recently. Remote coronary stent.  Secondary prevention needs to continue.  Will resume atorvastatin.  It was stopped during his stent on Hospice. Continue CPAP Blood pressure is adequate on current therapy.  May get to a point where diltiazem can be  discontinued. Hemoglobin A1c was 6.5 in February 2023. Still smokes or is in a smoke heavy environment based upon a smell out.  Resume atorvastatin 20 mg/day.  Liver and lipid panel in 2 months.   Overall education and awareness concerning secondary risk prevention was discussed in detail: LDL less than 70, hemoglobin A1c less than 7, blood pressure target less than 130/80 mmHg, >150 minutes of moderate aerobic activity per week, avoidance of smoking, weight control (via diet and exercise), and continued surveillance/management of/for obstructive sleep apnea.    Medication Adjustments/Labs and Tests Ordered: Current medicines are reviewed at length with the patient today.  Concerns regarding medicines are outlined above.  No orders of the defined types were placed in this encounter.  No orders of the defined types were placed in this encounter.   There are no Patient Instructions on file for this visit.   Signed, Sinclair Grooms, MD  01/03/2022 2:29 PM    [place]

## 2022-01-18 ENCOUNTER — Other Ambulatory Visit: Payer: Self-pay | Admitting: Family Medicine

## 2022-01-26 ENCOUNTER — Encounter: Payer: Self-pay | Admitting: Podiatry

## 2022-01-26 ENCOUNTER — Other Ambulatory Visit: Payer: Self-pay

## 2022-01-26 ENCOUNTER — Ambulatory Visit: Payer: PPO | Admitting: Podiatry

## 2022-01-26 DIAGNOSIS — M79676 Pain in unspecified toe(s): Secondary | ICD-10-CM | POA: Diagnosis not present

## 2022-01-26 DIAGNOSIS — B351 Tinea unguium: Secondary | ICD-10-CM

## 2022-01-26 DIAGNOSIS — E1151 Type 2 diabetes mellitus with diabetic peripheral angiopathy without gangrene: Secondary | ICD-10-CM | POA: Diagnosis not present

## 2022-01-26 DIAGNOSIS — N183 Chronic kidney disease, stage 3 unspecified: Secondary | ICD-10-CM | POA: Diagnosis not present

## 2022-01-26 NOTE — Progress Notes (Signed)
This patient returns to my office for at risk foot care.  This patient requires this care by a professional since this patient will be at risk due to having diabetes.  History of throat cancer.  This patient is unable to cut nails himself since the patient cannot reach his nails.These nails are painful walking and wearing shoes.  This patient presents for at risk foot care today. ? ?General Appearance  Alert, conversant and in no acute stress. ? ?Vascular  Dorsalis pedis and posterior tibial  pulses are weakly  palpable  bilaterally.  Capillary return is within normal limits  bilaterally. Temperature is within normal limits  bilaterally. ? ?Neurologic  Senn-Weinstein monofilament wire test diminished   bilaterally. Muscle power within normal limits bilaterally. ? ?Nails Thick disfigured discolored nails with subungual debris  from hallux to fifth toes bilaterally. No evidence of bacterial infection or drainage bilaterally. ? ?Orthopedic  No limitations of motion  feet .  No crepitus or effusions noted.  No bony pathology or digital deformities noted.HAV  B/L. ? ?Skin  normotropic skin with no porokeratosis noted bilaterally.  No signs of infections or ulcers noted.    ? ?Onychomycosis  Pain in right toes  Pain in left toes ? ?Consent was obtained for treatment procedures.   Mechanical debridement of nails 1-5  bilaterally performed with a nail nipper.  Filed with dremel without incident.  ? ? ?Return office visit    3 months                  Told patient to return for periodic foot care and evaluation due to potential at risk complications. ? ? ?Gardiner Barefoot DPM  ?

## 2022-01-29 ENCOUNTER — Telehealth: Payer: Self-pay | Admitting: Pharmacist

## 2022-01-29 NOTE — Progress Notes (Signed)
? ? ?Chronic Care Management ?Pharmacy Assistant  ? ?Name: [name]  MRN: [id number] DOB: 1951-10-11 ? ? ?Reason for Encounter: Disease State - General Adherence Call  ?  ? ? ?Recent office visits:  ?09/07/21 Keith Luo, MD - Family Medicine - COPD - levofloxacin (LEVAQUIN) 500 MG tablet and predniSONE (DELTASONE) 20 MG tablet prescribed. Follow up if no improvement.  ? ?Recent consult visits:  ?01/26/22 [name], DPM - Podiatry - Pain due to onychomycosis - Mechanical debridement of nails 1-5  bilaterally performed with a nail nipper. Filed with dremel without incident. Follow up in 3 months.  ? ?01/03/22 Keith Schick, MD - Cardiology - Neurocardiogenic Syndrome - Labs were ordered. Atorvastatin (LIPITOR) 20 MG tablet prescribed. Labs in 2 months.  ? ?10/27/21 [name], DPM - Podiatry - Pain due to onychomycosis - Mechanical debridement of nails 1-5  bilaterally performed with a nail nipper. Filed with dremel without incident. Follow up in 3 months.  ? ?07/28/22 [name], DPM - Podiatry - Pain due to onychomycosis - Mechanical debridement of nails 1-5  bilaterally performed with a nail nipper. Filed with dremel without incident. Follow up in 3 months.  ? ?Hospital visits:  ?None in previous 6 months ? ?Medications: ?Outpatient Encounter Medications as of 01/29/2022  ?Medication Sig  ? acetaminophen (TYLENOL) 500 MG tablet Take 1,000 mg by mouth every 6 (six) hours as needed for moderate pain.  ? albuterol (VENTOLIN HFA) 108 (90 Base) MCG/ACT inhaler   ? atorvastatin (LIPITOR) 20 MG tablet Take 1 tablet (20 mg total) by mouth daily.  ? diltiazem (CARDIZEM CD) 120 MG 24 hr capsule Take 1 capsule (120 mg total) by mouth daily.  ? famotidine (PEPCID) 40 MG/5ML suspension Take 5 mLs (40 mg total) by mouth daily. (Patient not taking: Reported on 01/03/2022)  ? FLUoxetine (PROZAC) 40 MG capsule TAKE 1 CAPSULE BY MOUTH EVERY DAY  ? gabapentin (NEURONTIN) 300 MG capsule TAKE 1 CAPSULE BY MOUTH THREE TIMES  A DAY  ? glucose blood (ONETOUCH ULTRA) test strip TEST ONCE DAILY E11.42  ? ipratropium-albuterol (DUONEB) 0.5-2.5 (3) MG/3ML SOLN Take 3 mLs by nebulization every 6 (six) hours as needed.  ? nitroGLYCERIN (NITROSTAT) 0.4 MG SL tablet Place 0.4 mg under the tongue every 5 (five) minutes as needed for chest pain.  ? oxyCODONE-acetaminophen (ROXICET) 5-325 MG/5ML solution Take 5 mLs by mouth 2 (two) times daily.  ? predniSONE (DELTASONE) 20 MG tablet 3 tabs poqday 1-2, 2 tabs poqday 3-4, 1 tab poqday 5-6 (Patient not taking: Reported on 01/03/2022)  ? senna (SENOKOT) 8.6 MG TABS tablet Take 1 tablet by mouth.  ? SORBITOL PO Take by mouth.  ? traZODone (DESYREL) 100 MG tablet Take 2 tablets (200 mg total) by mouth at bedtime as needed for sleep.  ? ?Facility-Administered Encounter Medications as of 01/29/2022  ?Medication  ? albuterol (PROVENTIL) (2.5 MG/3ML) 0.083% nebulizer solution 2.5 mg  ? ? ?Have you had any problems recently with your health? ?Patient denied any recent problems with his health.  ? ?Have you had any problems with your pharmacy? ?Patient denied any problems with his current pharmacy.  ? ?What issues or side effects are you having with your medications? ?Patient denied any issues or side effects with his current medications.  ? ?What would you like me to pass along to [name], CPP for them to help you with?  ?Patient did not have anything to pass along to CPP at this time.  ? ?What can  we do to take care of you better? ?Patient did not have any recommendations at this time. He reported he was doing well.  ? ? ?Care Gaps ? ?AWV: overdue ?Colonoscopy: done 09/19/11 ?DM Eye Exam: due 07/08/21 ?DM Foot Exam: due 04/21/22 ?Microalbumin: unknown  ?HbgAIC: done 03/20/21 (7.0) ?DEXA:  N/A ?Mammogram: N/A ? ? ?Star Rating Drugs: ?Atorvastatin (LIPITOR) 20 MG tablet - last filled 01/03/22 90 days  ? ? ?Future Appointments  ?Date Time Provider [place]  ?01/30/2022  8:45 AM Weaver, Keith Holms  [facility]  ?02/21/2022  9:45 AM [facility]  ?05/02/2022  8:15 AM [name], DPM [facility]  ? ? ? ?[name], CCMA ?Clinical Pharmacist Assistant  ?([phone] ? ?

## 2022-01-30 ENCOUNTER — Other Ambulatory Visit: Payer: PPO

## 2022-02-01 ENCOUNTER — Other Ambulatory Visit: Payer: Self-pay

## 2022-02-01 NOTE — Telephone Encounter (Signed)
?  Pt would like a Refill on  ? ?oxyCODONE (ROXICODONE) 5 MG/5ML solution  ?LOV 09/07/21 ?Last refill 01/01/22, #420ml, 0 refills ? ?Be sent to [id number] - [place], [place]  ?[place], [place] [address]  ?Phone:  [phone]  Fax:  [phone]  per pt ? ?Please review, thanks! ? ?

## 2022-02-02 ENCOUNTER — Other Ambulatory Visit: Payer: Self-pay | Admitting: Family Medicine

## 2022-02-02 MED ORDER — OXYCODONE-ACETAMINOPHEN 5-325 MG/5ML PO SOLN: 5.0000 mL | Freq: Two times a day (BID) | ORAL | 0 refills | Status: DC

## 2022-02-02 NOTE — Telephone Encounter (Signed)
Please resend Pt would like a Refill on  ?  ?oxyCODONE (ROXICODONE) 5 MG/5ML solution  ?LOV 09/07/21 ?Last refill 01/01/22, #488ml, 0 refills ? ? ?[place]  ? [place], [place] ? ?

## 2022-02-06 ENCOUNTER — Telehealth: Payer: Self-pay

## 2022-02-06 NOTE — Telephone Encounter (Signed)
Spoke with pt's wife, the OxyCodone-acetaminophen is costing them to much money and can't afford it. However the OxyCodone (Roxicodone) has been discontinued per what pharmacy told pt.  ? ?Please advise if there is something else, pt is in a lot of pain? ?

## 2022-02-07 NOTE — Telephone Encounter (Signed)
Left msg for pt.

## 2022-02-08 ENCOUNTER — Other Ambulatory Visit: Payer: Self-pay | Admitting: Family Medicine

## 2022-02-08 MED ORDER — OXYCODONE-ACETAMINOPHEN 5-325 MG PO TABS: 1.0000 | ORAL_TABLET | Freq: Three times a day (TID) | ORAL | 0 refills | Status: AC | PRN

## 2022-02-08 NOTE — Telephone Encounter (Signed)
Per pt will do the percocet pills. ? ?If you can pls call Rx today. Thank you ?

## 2022-02-09 ENCOUNTER — Other Ambulatory Visit: Payer: Self-pay | Admitting: Interventional Cardiology

## 2022-02-13 ENCOUNTER — Ambulatory Visit (INDEPENDENT_AMBULATORY_CARE_PROVIDER_SITE_OTHER): Payer: PPO | Admitting: Family Medicine

## 2022-02-13 VITALS — BP 122/78 | HR 77 | Temp 97.3°F | Ht 71.0 in | Wt 160.6 lb

## 2022-02-13 DIAGNOSIS — C3412 Malignant neoplasm of upper lobe, left bronchus or lung: Secondary | ICD-10-CM

## 2022-02-13 DIAGNOSIS — E7800 Pure hypercholesterolemia, unspecified: Secondary | ICD-10-CM | POA: Diagnosis not present

## 2022-02-13 DIAGNOSIS — C320 Malignant neoplasm of glottis: Secondary | ICD-10-CM

## 2022-02-13 DIAGNOSIS — E118 Type 2 diabetes mellitus with unspecified complications: Secondary | ICD-10-CM | POA: Diagnosis not present

## 2022-02-13 DIAGNOSIS — J449 Chronic obstructive pulmonary disease, unspecified: Secondary | ICD-10-CM | POA: Diagnosis not present

## 2022-02-13 DIAGNOSIS — Z902 Acquired absence of lung [part of]: Secondary | ICD-10-CM

## 2022-02-13 NOTE — Progress Notes (Signed)
? ?Subjective:  ? ? Patient ID: [name], male    DOB: 01-22-1951, 71 y.o.   MRN: [id number] ? ?HPI ?I last saw the patient in November.  Patient has a history of cancer in his left lung that was non-small cell lung cancer.  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  He has declined further treatment with radiation and chemotherapy.  However since I last saw the patient he is lost 7 pounds.  He reports shortness of breath.  He reports worsening cough.  He reports bilateral chest pain.  The pain is in his ribs when he coughs.  He has not had a CAT scan of his lung since January 2022.  However the patient states that he wants to know if the cancer is coming back.  He is not sure that he would want additional treatment but he would want to know prognosis.  He is taking Breztri 2 puffs twice daily.  He recently ran out of his oxycodone.  He thinks that has made the pain in his chest worse.  He denies any fevers or chills.  Today on examination his pulse oximetry is good.  His lungs are clear.  He does have diminished breath sounds bilaterally consistent with emphysema but there is no crackles or rails or wheezing appreciated.  He reports trouble swallowing due to previous radiation on his neck and his throat.  He denies any hemoptysis or hematemesis.  He denies any melena or hematochezia ?Past Medical History:  ?Diagnosis Date  ? Allergy   ? Anemia   ? Anginal pain ([place])   ? Arthritis   ? "hands"  ? Asthma   ? Back pain   ? 4 deteriorating disc and receives an injection q3-62mon;has been doing this for about 21yrs  ? Blood transfusion   ? as a child  ? Bronchitis   ? Cancer [facility]) dx'd 2013  ? Non-small cell lung cancer  ? Chronic kidney disease   ? acute kidney failure post surgery  ? COPD (chronic obstructive pulmonary disease) ([place])   ? uses Albuterol and Spiriva daily  ? Coronary artery disease   ? has 1 stent  ? Depression   ? takes Prozac daily  ? Emphysema   ? sees Dr.Ramaswami for this  ? Family  history of adverse reaction to anesthesia   ? mother was hard to wake up sometimes  ? GERD (gastroesophageal reflux disease)   ? takes Prilosec daily, EGD nml 06/2017  ? Glaucoma   ? hx of  ? H/O hiatal hernia   ? Hypertension   ? takes  HYzaar daily  ? Insomnia   ? takes Trazodone nightly  ? Lung mass   ? right upper lobe  ? Myocardial infarct [facility])   ? 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 Obesity   ? 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 Periodic limb movements of sleep   ? Peripheral neuropathy   ? Pneumonia   ? hx of' 71 yo,rd' last time in 2013  ? Productive cough   ? white in color but no odor  ? Shortness of breath   ? with exertion   ? Sleep apnea   ? uses BiPaP; "no longer have sleep apnea since I have lost over 100 lbs"  ? Syncope and collapse 05/26/2013  ? Thyroid disease   ? Type II diabetes mellitus ([place])   ? takes Metformin bid and Novolog  and Lantus daily  ? ?Past Surgical History:  ?Procedure Laterality Date  ? CARDIAC CATHETERIZATION  2006/2008/2009  ? CARPAL TUNNEL RELEASE    ? bilateral  ? COLONOSCOPY    ? CORONARY ANGIOPLASTY WITH STENT PLACEMENT    ? 1 stent  ? ELBOW SURGERY    ? ulnar nerve; left  ? ESOPHAGOGASTRODUODENOSCOPY    ? EYE SURGERY    ? pt. denies  ? FUDUCIAL PLACEMENT Left 08/24/2016  ? Procedure: PLACEMENT OF FUDUCIAL;  Surgeon: Melrose Nakayama, MD;  Location: [place];  Service: Thoracic;  Laterality: Left;  ? INGUINAL HERNIA REPAIR  1990's  ? double inguinal  ? LOBECTOMY  03/17/2012  ? upper right side with resection  ? MICROLARYNGOSCOPY WITH CO2 LASER AND EXCISION OF VOCAL CORD LESION N/A 06/28/2017  ? Procedure: MICROLARYNGOSCOPY WITH BIOPSY;  Surgeon: Melida Quitter, MD;  Location: [place];  Service: ENT;  Laterality: N/A;  ? MICROLARYNGOSCOPY WITH CO2 LASER AND EXCISION OF VOCAL CORD LESION N/A 07/19/2017  ? Procedure: MICROLARYNGOSCOPY WITH CO2 LASER AND EXCISION OF VOCAL CORD LESION;  Surgeon: Melida Quitter, MD;  Location: [place];  Service: ENT;  Laterality:  N/A;  MICRO DIRECT LARYNGOSCOPY WITH CO2 LASER VOCAL CORD STRIPPING/POSS JET VENTURI VENTILATION  ? REFRACTIVE SURGERY    ? bilaterally  ? ROTATOR CUFF REPAIR    ? left  ? VIDEO BRONCHOSCOPY WITH ENDOBRONCHIAL NAVIGATION N/A 08/24/2016  ? Procedure: VIDEO BRONCHOSCOPY WITH ENDOBRONCHIAL NAVIGATION;  Surgeon: Melrose Nakayama, MD;  Location: [place];  Service: Thoracic;  Laterality: N/A;  ? ?Current Outpatient Medications on File Prior to Visit  ?Medication Sig Dispense Refill  ? acetaminophen (TYLENOL) 500 MG tablet Take 1,000 mg by mouth every 6 (six) hours as needed for moderate pain.    ? albuterol (VENTOLIN HFA) 108 (90 Base) MCG/ACT inhaler     ? atorvastatin (LIPITOR) 20 MG tablet Take 1 tablet (20 mg total) by mouth daily. 90 tablet 3  ? diltiazem (CARDIZEM CD) 120 MG 24 hr capsule TAKE 1 CAPSULE BY MOUTH EVERY DAY 90 capsule 3  ? famotidine (PEPCID) 40 MG/5ML suspension Take 5 mLs (40 mg total) by mouth daily. 150 mL 11  ? FLUoxetine (PROZAC) 40 MG capsule TAKE 1 CAPSULE BY MOUTH EVERY DAY 90 capsule 2  ? gabapentin (NEURONTIN) 300 MG capsule TAKE 1 CAPSULE BY MOUTH THREE TIMES A DAY 270 capsule 2  ? glucose blood (ONETOUCH ULTRA) test strip TEST ONCE DAILY E11.42    ? ipratropium-albuterol (DUONEB) 0.5-2.5 (3) MG/3ML SOLN Take 3 mLs by nebulization every 6 (six) hours as needed. 360 mL 11  ? nitroGLYCERIN (NITROSTAT) 0.4 MG SL tablet Place 0.4 mg under the tongue every 5 (five) minutes as needed for chest pain.    ? oxyCODONE-acetaminophen (PERCOCET/ROXICET) 5-325 MG tablet Take 1 tablet by mouth every 8 (eight) hours as needed for up to 5 days for severe pain. 90 tablet 0  ? senna (SENOKOT) 8.6 MG TABS tablet Take 1 tablet by mouth.    ? SORBITOL PO Take by mouth.    ? traZODone (DESYREL) 100 MG tablet Take 2 tablets (200 mg total) by mouth at bedtime as needed for sleep. 180 tablet 3  ? ?Current Facility-Administered Medications on File Prior to Visit  ?Medication Dose Route Frequency Provider Last Rate  Last Admin  ? albuterol (PROVENTIL) (2.5 MG/3ML) 0.083% nebulizer solution 2.5 mg  2.5 mg Nebulization Once Delsa Grana, PA-C      ? ?Allergies  ?Allergen Reactions  ?  Actos [Pioglitazone] Swelling  ?  EDEMA REACTION UNSPECIFIED  ? Avandia [Rosiglitazone] Swelling  ?  SWELLING REACTION UNSPECIFIED   ? Hydromorphone   ?  Other reaction(s): hallucinations  ? Dilaudid [Hydromorphone Hcl] Other (See Comments)  ?  Made him crazy  ? ?Social History  ? ?Socioeconomic History  ? Marital status: Married  ?  Spouse name: mary lou  ? Number of children: 3  ? Years of education: trade   ? Highest education level: Not on file  ?Occupational History  ? Occupation: truck Geophysicist/field seismologist  ?  Comment: retired  ?Tobacco Use  ? Smoking status: Every Day  ?  Packs/day: 3.00  ?  Years: 54.00  ?  Pack years: 162.00  ?  Types: Cigarettes  ?  Start date: 1963  ? Smokeless tobacco: Never  ? Tobacco comments:  ?  1ppd as of 01/04/21  ep  ?Vaping Use  ? Vaping Use: Former  ?Substance and Sexual Activity  ? Alcohol use: No  ?  Comment: no alcolol since 2013  ? Drug use: Yes  ?  Types: Cocaine, Marijuana  ?  Comment: quit 1990's  ? Sexual activity: Not Currently  ?Other Topics Concern  ? Not on file  ?Social History Narrative  ? Lives in [place] with wife  ? She is his NOK  ? 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 Has 3 kids  ? 06-25-18 Unable to ask abuse questions wife with him today.  ? ?Social Determinants of Health  ? ?Financial Resource Strain: Low Risk   ? Difficulty of Paying Living Expenses: Not hard at all  ?Food Insecurity: No Food Insecurity  ? Worried About Charity fundraiser in the Last Year: Never true  ? Ran Out of Food in the Last Year: Never true  ?Transportation Needs: No Transportation Needs  ? Lack of Transportation (Medical): No  ? Lack of Transportation (Non-Medical): No  ?Physical Activity: Inactive  ? Days of Exercise per Week: 0 days  ? Minutes of Exercise per Session: 0  min  ?Stress: No Stress Concern Present  ? Feeling of Stress : Not at all  ?Social Connections: Moderately Isolated  ? Frequency of Communication with Friends and Family: Never  ? Frequency of Social Gather

## 2022-02-14 LAB — CBC WITH DIFFERENTIAL/PLATELET
Absolute Monocytes: 744 cells/uL (ref 200–950)
Basophils Absolute: 25 cells/uL (ref 0–200)
Basophils Relative: 0.2 %
Eosinophils Absolute: 50 cells/uL (ref 15–500)
Eosinophils Relative: 0.4 %
HCT: 39.3 % (ref 38.5–50.0)
Hemoglobin: 13 g/dL — ABNORMAL LOW (ref 13.2–17.1)
Lymphs Abs: 1910 cells/uL (ref 850–3900)
MCH: 30.4 pg (ref 27.0–33.0)
MCHC: 33.1 g/dL (ref 32.0–36.0)
MCV: 92 fL (ref 80.0–100.0)
MPV: 9.9 fL (ref 7.5–12.5)
Monocytes Relative: 6 %
Neutro Abs: 9672 cells/uL — ABNORMAL HIGH (ref 1500–7800)
Neutrophils Relative %: 78 %
Platelets: 267 10*3/uL (ref 140–400)
RBC: 4.27 10*6/uL (ref 4.20–5.80)
RDW: 13.6 % (ref 11.0–15.0)
Total Lymphocyte: 15.4 %
WBC: 12.4 10*3/uL — ABNORMAL HIGH (ref 3.8–10.8)

## 2022-02-14 LAB — COMPLETE METABOLIC PANEL WITH GFR
AG Ratio: 1.7 (calc) (ref 1.0–2.5)
ALT: 10 U/L (ref 9–46)
AST: 12 U/L (ref 10–35)
Albumin: 4.1 g/dL (ref 3.6–5.1)
Alkaline phosphatase (APISO): 85 U/L (ref 35–144)
BUN: 22 mg/dL (ref 7–25)
CO2: 25 mmol/L (ref 20–32)
Calcium: 9.1 mg/dL (ref 8.6–10.3)
Chloride: 100 mmol/L (ref 98–110)
Creat: 1.22 mg/dL (ref 0.70–1.28)
Globulin: 2.4 g/dL (calc) (ref 1.9–3.7)
Glucose, Bld: 87 mg/dL (ref 65–99)
Potassium: 4.8 mmol/L (ref 3.5–5.3)
Sodium: 138 mmol/L (ref 135–146)
Total Bilirubin: 0.4 mg/dL (ref 0.2–1.2)
Total Protein: 6.5 g/dL (ref 6.1–8.1)
eGFR: 63 mL/min/{1.73_m2} (ref >=60)

## 2022-02-14 LAB — LIPID PANEL
Cholesterol: 128 mg/dL (ref <200)
HDL: 51 mg/dL (ref >=40)
LDL Cholesterol (Calc): 63 mg/dL (calc)
Non-HDL Cholesterol (Calc): 77 mg/dL (calc) (ref <130)
Total CHOL/HDL Ratio: 2.5 (calc) (ref <5.0)
Triglycerides: 61 mg/dL (ref <150)

## 2022-02-14 LAB — HEMOGLOBIN A1C
Hgb A1c MFr Bld: 6.1 % of total Hgb — ABNORMAL HIGH (ref <5.7)
Mean Plasma Glucose: 128 mg/dL
eAG (mmol/L): 7.1 mmol/L

## 2022-02-14 NOTE — Telephone Encounter (Signed)
Per pt called today see if you would send the Percocet ?

## 2022-02-15 ENCOUNTER — Other Ambulatory Visit: Payer: Self-pay | Admitting: Family Medicine

## 2022-02-15 MED ORDER — OXYCODONE-ACETAMINOPHEN 5-325 MG PO TABS: 1.0000 | ORAL_TABLET | ORAL | 0 refills | Status: AC | PRN

## 2022-02-15 NOTE — Telephone Encounter (Signed)
Dr. Pickard sent in Rx  ?

## 2022-02-20 ENCOUNTER — Telehealth: Payer: Self-pay | Admitting: Interventional Cardiology

## 2022-02-20 NOTE — Telephone Encounter (Signed)
New Message: ? ? ? ? ?Patient's wife called and wanted Dr Thompson Caul Nurse to know that patient had  his lab work at his Primary doctor last week. She said the results should be in Epic. ?

## 2022-02-21 ENCOUNTER — Other Ambulatory Visit: Payer: PPO

## 2022-02-21 NOTE — Telephone Encounter (Signed)
Pt's Atorvastatin was restarted on 01/03/22 and we asked that he get a Lipid and liver in 6-8 weeks.  Pt had it done at PCP.  Available in Epic.  ?

## 2022-02-23 NOTE — Telephone Encounter (Signed)
Spoke with wife and made her aware of info from Dr. Tamala Julian.  She was appreciative for call.  ?

## 2022-02-26 ENCOUNTER — Telehealth: Payer: Self-pay

## 2022-02-26 NOTE — Telephone Encounter (Signed)
Forms in Dr. Samella Parr office.  ?

## 2022-03-12 ENCOUNTER — Ambulatory Visit
Admission: RE | Admit: 2022-03-12 | Discharge: 2022-03-12 | Disposition: A | Discharge status: Home / Self Care | Payer: PPO | Source: Ambulatory Visit | Attending: Family Medicine | Admitting: Family Medicine

## 2022-03-12 DIAGNOSIS — I700 Atherosclerosis of aorta: Secondary | ICD-10-CM | POA: Diagnosis not present

## 2022-03-12 DIAGNOSIS — I2510 Atherosclerotic heart disease of native coronary artery without angina pectoris: Secondary | ICD-10-CM | POA: Diagnosis not present

## 2022-03-12 DIAGNOSIS — C3490 Malignant neoplasm of unspecified part of unspecified bronchus or lung: Secondary | ICD-10-CM | POA: Diagnosis not present

## 2022-03-12 DIAGNOSIS — C3412 Malignant neoplasm of upper lobe, left bronchus or lung: Secondary | ICD-10-CM

## 2022-03-12 DIAGNOSIS — Z902 Acquired absence of lung [part of]: Secondary | ICD-10-CM

## 2022-03-12 DIAGNOSIS — J432 Centrilobular emphysema: Secondary | ICD-10-CM | POA: Diagnosis not present

## 2022-03-12 MED ORDER — IOPAMIDOL (ISOVUE-300) INJECTION 61%
75.0000 mL | Freq: Once | INTRAVENOUS | Status: AC | PRN
  Administered 2022-03-12: 75 mL via INTRAVENOUS

## 2022-04-18 ENCOUNTER — Telehealth: Payer: Self-pay | Admitting: Pharmacist

## 2022-04-18 NOTE — Progress Notes (Signed)
Chronic Care Management Pharmacy Assistant   Name: [name]  MRN: [id number] DOB: 12/27/1950   Reason for Encounter: Diabetes Adherence Call    Recent office visits:  02/13/2022 [facility] (PCP) [name], MD; no medication changes indicated.  Recent consult visits:  01/26/2022 [facility] (Podiatry) Gardiner Barefoot, DPM; no medication changes indicated.  Hospital visits:  None in previous 6 months  Medications: Outpatient Encounter Medications as of 04/18/2022  Medication Sig   acetaminophen (TYLENOL) 500 MG tablet Take 1,000 mg by mouth every 6 (six) hours as needed for moderate pain.   albuterol (VENTOLIN HFA) 108 (90 Base) MCG/ACT inhaler    atorvastatin (LIPITOR) 20 MG tablet Take 1 tablet (20 mg total) by mouth daily.   Budeson-Glycopyrrol-Formoterol (BREZTRI AEROSPHERE) 160-9-4.8 MCG/ACT AERO Inhale into the lungs. 2 puffs in the morning and 2 puffs at night   diltiazem (CARDIZEM CD) 120 MG 24 hr capsule TAKE 1 CAPSULE BY MOUTH EVERY DAY   famotidine (PEPCID) 40 MG/5ML suspension Take 5 mLs (40 mg total) by mouth daily.   FLUoxetine (PROZAC) 40 MG capsule TAKE 1 CAPSULE BY MOUTH EVERY DAY   gabapentin (NEURONTIN) 300 MG capsule TAKE 1 CAPSULE BY MOUTH THREE TIMES A DAY   glucose blood (ONETOUCH ULTRA) test strip TEST ONCE DAILY E11.42   ipratropium-albuterol (DUONEB) 0.5-2.5 (3) MG/3ML SOLN Take 3 mLs by nebulization every 6 (six) hours as needed.   nitroGLYCERIN (NITROSTAT) 0.4 MG SL tablet Place 0.4 mg under the tongue every 5 (five) minutes as needed for chest pain.   senna (SENOKOT) 8.6 MG TABS tablet Take 1 tablet by mouth.   SORBITOL PO Take by mouth.   traZODone (DESYREL) 100 MG tablet Take 2 tablets (200 mg total) by mouth at bedtime as needed for sleep.   Facility-Administered Encounter Medications as of 04/18/2022  Medication   albuterol (PROVENTIL) (2.5 MG/3ML) 0.083% nebulizer solution 2.5 mg   Recent Relevant Labs: Lab Results  Component Value Date/Time    HGBA1C 6.1 (H) 02/13/2022 09:40 AM   HGBA1C 7.0 (H) 03/20/2021 05:31 PM    Kidney Function Lab Results  Component Value Date/Time   CREATININE 1.22 02/13/2022 09:35 AM   CREATININE 1.26 06/20/2021 04:42 PM   CREATININE 1.4 (H) 11/04/2017 01:47 PM   CREATININE 1.5 (H) 09/11/2017 09:20 AM   GFR 41.76 (L) 04/30/2013 04:00 PM   GFRNONAA 50 (L) 03/30/2021 12:33 PM   GFRAA 58 (L) 03/30/2021 12:33 PM    Current antihyperglycemic regimen:  None  What recent interventions/DTPs have been made to improve glycemic control:  No recent interventions or DTPs.  Have there been any recent hospitalizations or ED visits since last visit with CPP? No  Patient denies hypoglycemic symptoms.  Patient denies hyperglycemic symptoms.  How often are you checking your blood sugar? once daily  What are your blood sugars ranging?  Fasting: 110  During the week, how often does your blood glucose drop below 70? Never  Are you checking your feet daily/regularly? Yes  Adherence Review: Is the patient currently on a STATIN medication? Yes Is the patient currently on ACE/ARB medication? No Does the patient have >5 day gap between last estimated fill dates? No  Care Gaps: Medicare Annual Wellness: Completed 04/20/2021 Ophthalmology Exam: Overdue since 07/08/2021 Foot Exam: Next due on 04/21/2022 Hemoglobin A1C: 6.1% on 02/13/2022 Colonoscopy: Overdue since 09/18/2021  Future Appointments  Date Time Provider [place]  05/02/2022  8:15 AM Gardiner Barefoot, DPM [facility]   Star Rating Drugs:  Atorvastatin 20 mg last filled 03/31/2022 90 DS  April D Calhoun, [place] Pharmacist Assistant [phone]

## 2022-05-02 ENCOUNTER — Encounter: Payer: Self-pay | Admitting: Podiatry

## 2022-05-02 ENCOUNTER — Ambulatory Visit: Payer: PPO | Admitting: Podiatry

## 2022-05-02 DIAGNOSIS — B351 Tinea unguium: Secondary | ICD-10-CM

## 2022-05-02 DIAGNOSIS — E1151 Type 2 diabetes mellitus with diabetic peripheral angiopathy without gangrene: Secondary | ICD-10-CM | POA: Diagnosis not present

## 2022-05-02 DIAGNOSIS — M79676 Pain in unspecified toe(s): Secondary | ICD-10-CM | POA: Diagnosis not present

## 2022-05-02 DIAGNOSIS — N1832 Chronic kidney disease, stage 3b: Secondary | ICD-10-CM | POA: Diagnosis not present

## 2022-05-02 DIAGNOSIS — N179 Acute kidney failure, unspecified: Secondary | ICD-10-CM

## 2022-05-02 DIAGNOSIS — N183 Chronic kidney disease, stage 3 unspecified: Secondary | ICD-10-CM

## 2022-05-02 DIAGNOSIS — M2010 Hallux valgus (acquired), unspecified foot: Secondary | ICD-10-CM

## 2022-05-02 NOTE — Progress Notes (Signed)
This patient returns to my office for at risk foot care.  This patient requires this care by a professional since this patient will be at risk due to having diabetes.  History of throat cancer.  This patient is unable to cut nails himself since the patient cannot reach his nails.These nails are painful walking and wearing shoes.  This patient presents for at risk foot care today.  General Appearance  Alert, conversant and in no acute stress.  Vascular  Dorsalis pedis and posterior tibial  pulses are weakly  palpable  bilaterally.  Capillary return is within normal limits  bilaterally. Temperature is within normal limits  bilaterally.  Neurologic  Senn-Weinstein monofilament wire test diminished   bilaterally. Muscle power within normal limits bilaterally.  Nails Thick disfigured discolored nails with subungual debris  from hallux to fifth toes bilaterally. No evidence of bacterial infection or drainage bilaterally.  Orthopedic  No limitations of motion  feet .  No crepitus or effusions noted.  No bony pathology or digital deformities noted.HAV  B/L.  Skin  normotropic skin with no porokeratosis noted bilaterally.  No signs of infections or ulcers noted.     Onychomycosis  Pain in right toes  Pain in left toes  Consent was obtained for treatment procedures.   Mechanical debridement of nails 1-5  bilaterally performed with a nail nipper.  Filed with dremel without incident.    Return office visit    3 months                  Told patient to return for periodic foot care and evaluation due to potential at risk complications.   Gardiner Barefoot DPM

## 2022-06-18 DIAGNOSIS — E1142 Type 2 diabetes mellitus with diabetic polyneuropathy: Secondary | ICD-10-CM | POA: Diagnosis not present

## 2022-06-18 DIAGNOSIS — N1831 Chronic kidney disease, stage 3a: Secondary | ICD-10-CM | POA: Diagnosis not present

## 2022-07-11 ENCOUNTER — Ambulatory Visit: Payer: PPO | Admitting: Podiatry

## 2022-08-10 ENCOUNTER — Telehealth: Payer: Self-pay | Admitting: Pharmacist

## 2022-08-10 NOTE — Progress Notes (Signed)
Chronic Care Management Pharmacy Assistant   Name: [name]  MRN: [id number] DOB: 1950/12/18  Reason for Encounter: General Adherence Call    Recent office visits:  08/20/2022 [facility] (PCP) [name], MD; no medication changes indicated.  Recent consult visits:  05/02/2022 [facility] (Podiatry) Gardiner Barefoot, DPM; no medication changes indicated.  Hospital visits:  None in previous 6 months  Medications: Outpatient Encounter Medications as of 08/10/2022  Medication Sig   acetaminophen (TYLENOL) 500 MG tablet Take 1,000 mg by mouth every 6 (six) hours as needed for moderate pain.   albuterol (VENTOLIN HFA) 108 (90 Base) MCG/ACT inhaler    atorvastatin (LIPITOR) 20 MG tablet Take 1 tablet (20 mg total) by mouth daily.   Budeson-Glycopyrrol-Formoterol (BREZTRI AEROSPHERE) 160-9-4.8 MCG/ACT AERO Inhale into the lungs. 2 puffs in the morning and 2 puffs at night   diltiazem (CARDIZEM CD) 120 MG 24 hr capsule TAKE 1 CAPSULE BY MOUTH EVERY DAY   famotidine (PEPCID) 40 MG/5ML suspension Take 5 mLs (40 mg total) by mouth daily.   FLUoxetine (PROZAC) 40 MG capsule TAKE 1 CAPSULE BY MOUTH EVERY DAY   gabapentin (NEURONTIN) 300 MG capsule TAKE 1 CAPSULE BY MOUTH THREE TIMES A DAY   glucose blood (ONETOUCH ULTRA) test strip TEST ONCE DAILY E11.42   ipratropium-albuterol (DUONEB) 0.5-2.5 (3) MG/3ML SOLN Take 3 mLs by nebulization every 6 (six) hours as needed.   nitroGLYCERIN (NITROSTAT) 0.4 MG SL tablet Place 0.4 mg under the tongue every 5 (five) minutes as needed for chest pain.   senna (SENOKOT) 8.6 MG TABS tablet Take 1 tablet by mouth.   SORBITOL PO Take by mouth.   traZODone (DESYREL) 100 MG tablet Take 2 tablets (200 mg total) by mouth at bedtime as needed for sleep.   Facility-Administered Encounter Medications as of 08/10/2022  Medication   albuterol (PROVENTIL) (2.5 MG/3ML) 0.083% nebulizer solution 2.5 mg   Contacted Nash Dimmer for General Review Call   Chart  Review:  Have there been any documented new, changed, or discontinued medications since last visit? No Has there been any documented recent hospitalizations or ED visits since last visit with Clinical Pharmacist? No   Adherence Review:  Does the Clinical Pharmacist Assistant have access to adherence rates? No Adherence rates for STAR metric medications: Atorvastatin 20 mg last filled 06/27/2022 90 DS Does the patient have >5 day gap between last estimated fill dates for any of the above medications or other medication gaps? No Reason for medication gaps.   Disease State Questions:  Able to connect with Patient? Yes Did patient have any problems with their health recently? No Have you had any admissions or emergency room visits or worsening of your condition(s) since last visit? No Have you had any visits with new specialists or providers since your last visit? No Have you had any new health care problem(s) since your last visit? No Have you run out of any of your medications since you last spoke with clinical pharmacist? No Are there any medications you are not taking as prescribed? No Are you having any issues or side effects with your medications? No Do you have any other health concerns or questions you want to discuss with your Clinical Pharmacist before your next visit? No Are there any health concerns that you feel we can do a better job addressing? No Are you having any problems with any of the following since the last visit: (select all that apply)  None 12. Any falls  since last visit? No 13. Any increased or uncontrolled pain since last visit? No  *Patients wife declined to schedule a follow up telephone visit with clinical pharmacist during the time of the call. She requested a call back at a later time.  Care Gaps: Medicare Annual Wellness: Completed 04/20/2021 Ophthalmology Exam: Overdue since 07/08/2021 Foot Exam: Next due on 04/21/2022 Hemoglobin A1C: 6.1% on  02/13/2022 Colonoscopy: Overdue since 09/18/2021  No future appointments.  Star Rating Drugs: Atorvastatin 20 mg last filled 06/27/2022 90 DS  April D Calhoun, [place] Pharmacist Assistant [phone]

## 2022-08-23 ENCOUNTER — Ambulatory Visit (INDEPENDENT_AMBULATORY_CARE_PROVIDER_SITE_OTHER): Payer: PPO | Admitting: Family Medicine

## 2022-08-23 VITALS — BP 124/76 | HR 69 | Temp 98.6°F | Ht 71.0 in | Wt 151.0 lb

## 2022-08-23 DIAGNOSIS — E7800 Pure hypercholesterolemia, unspecified: Secondary | ICD-10-CM

## 2022-08-23 DIAGNOSIS — C320 Malignant neoplasm of glottis: Secondary | ICD-10-CM | POA: Diagnosis not present

## 2022-08-23 DIAGNOSIS — J449 Chronic obstructive pulmonary disease, unspecified: Secondary | ICD-10-CM

## 2022-08-23 DIAGNOSIS — E118 Type 2 diabetes mellitus with unspecified complications: Secondary | ICD-10-CM | POA: Diagnosis not present

## 2022-08-23 DIAGNOSIS — Z23 Encounter for immunization: Secondary | ICD-10-CM | POA: Diagnosis not present

## 2022-08-23 DIAGNOSIS — I10 Essential (primary) hypertension: Secondary | ICD-10-CM | POA: Diagnosis not present

## 2022-08-23 DIAGNOSIS — Z902 Acquired absence of lung [part of]: Secondary | ICD-10-CM | POA: Diagnosis not present

## 2022-08-23 MED ORDER — OXYCODONE-ACETAMINOPHEN 5-325 MG PO TABS: 1.0000 | ORAL_TABLET | ORAL | 0 refills | Status: DC | PRN

## 2022-08-23 NOTE — Addendum Note (Signed)
Addended by: Randal Buba K on: 08/23/2022 08:50 AM   Modules accepted: Orders

## 2022-08-23 NOTE — Progress Notes (Signed)
Subjective:    Patient ID: [name], male    DOB: 1951/08/03, 71 y.o.   MRN: [id number]  HPI Wt Readings from Last 3 Encounters:  08/23/22 151 lb (68.5 kg)  02/13/22 160 lb 9.6 oz (72.8 kg)  01/03/22 161 lb 14.4 oz (73.4 kg)    I last saw the patient in April.  Patient has a history of cancer in his right lung that was non-small cell lung cancer.  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  He has declined further treatment with radiation and chemotherapy.   CT chest 03/07/22: IMPRESSION: 1. Stable CT of the chest status post right upper lobectomy. No specific findings of recurrent or metastatic disease in the chest. 2. Stable post radiation consolidation within the superior segment of left lower lobe. 3. Coronary artery calcifications. 4. Aortic Atherosclerosis (ICD10-I70.0) and Emphysema (ICD10-J43.9).  Patient is requesting a refill on his oxycodone that he takes occasionally for the pain in his neck and in his chest due to the previous radiation.  He has lost almost 10 pounds since his last visit.  He denies any new chest pain or new shortness of breath or hemoptysis or pleurisy.  He does report melena.  He states for the last month he has been seeing black stool.  He denies any hematochezia.  He denies any nausea or vomiting.  He denies any abdominal pain. Past Medical History:  Diagnosis Date   Allergy    Anemia    Anginal pain ([place])    Arthritis    "hands"   Asthma    Back pain    4 deteriorating disc and receives an injection q3-106mon;has been doing this for about 63yrs   Blood transfusion    as a child   Bronchitis    Cancer ([place]) dx'd 2013   Non-small cell lung cancer   Chronic kidney disease    acute kidney failure post surgery   COPD (chronic obstructive pulmonary disease) (HCC)    uses Albuterol and Spiriva daily   Coronary artery disease    has 1 stent   Depression    takes Prozac daily   Emphysema    sees Dr.Ramaswami for this   Family history  of adverse reaction to anesthesia    mother was hard to wake up sometimes   GERD (gastroesophageal reflux disease)    takes Prilosec daily, EGD nml 06/2017   Glaucoma    hx of   H/O hiatal hernia    Hypertension    takes  HYzaar daily   Insomnia    takes Trazodone nightly   Lung mass    right upper lobe   Myocardial infarct [facility])    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   Obesity    Parathyroid disease ([place])    Periodic limb movements of sleep    Peripheral neuropathy    Pneumonia    hx of' 71 yo,rd' last time in 2013   Productive cough    white in color but no odor   Shortness of breath    with exertion    Sleep apnea    uses BiPaP; "no longer have sleep apnea since I have lost over 100 lbs"   Syncope and collapse 05/26/2013   Thyroid disease    Type II diabetes mellitus ([place])    takes Metformin bid and Novolog and Lantus daily   Past Surgical History:  Procedure Laterality Date  CARDIAC CATHETERIZATION  2006/2008/2009   CARPAL TUNNEL RELEASE     bilateral   COLONOSCOPY     CORONARY ANGIOPLASTY WITH STENT PLACEMENT     1 stent   ELBOW SURGERY     ulnar nerve; left   ESOPHAGOGASTRODUODENOSCOPY     EYE SURGERY     pt. denies   FUDUCIAL PLACEMENT Left 08/24/2016   Procedure: PLACEMENT OF FUDUCIAL;  Surgeon: Melrose Nakayama, MD;  Location: [place];  Service: Thoracic;  Laterality: Left;   INGUINAL HERNIA REPAIR  1990's   double inguinal   LOBECTOMY  03/17/2012   upper right side with resection   MICROLARYNGOSCOPY WITH CO2 LASER AND EXCISION OF VOCAL CORD LESION N/A 06/28/2017   Procedure: MICROLARYNGOSCOPY WITH BIOPSY;  Surgeon: Melida Quitter, MD;  Location: [place];  Service: ENT;  Laterality: N/A;   MICROLARYNGOSCOPY WITH CO2 LASER AND EXCISION OF VOCAL CORD LESION N/A 07/19/2017   Procedure: MICROLARYNGOSCOPY WITH CO2 LASER AND EXCISION OF VOCAL CORD LESION;  Surgeon: Melida Quitter, MD;  Location: [place];  Service: ENT;  Laterality: N/A;   MICRO DIRECT LARYNGOSCOPY WITH CO2 LASER VOCAL CORD STRIPPING/POSS JET VENTURI VENTILATION   REFRACTIVE SURGERY     bilaterally   ROTATOR CUFF REPAIR     left   VIDEO BRONCHOSCOPY WITH ENDOBRONCHIAL NAVIGATION N/A 08/24/2016   Procedure: VIDEO BRONCHOSCOPY WITH ENDOBRONCHIAL NAVIGATION;  Surgeon: Melrose Nakayama, MD;  [facility];  Service: Thoracic;  Laterality: N/A;   Current Outpatient Medications on File Prior to Visit  Medication Sig Dispense Refill   acetaminophen (TYLENOL) 500 MG tablet Take 1,000 mg by mouth every 6 (six) hours as needed for moderate pain.     albuterol (VENTOLIN HFA) 108 (90 Base) MCG/ACT inhaler      atorvastatin (LIPITOR) 20 MG tablet Take 1 tablet (20 mg total) by mouth daily. 90 tablet 3   Budeson-Glycopyrrol-Formoterol (BREZTRI AEROSPHERE) 160-9-4.8 MCG/ACT AERO Inhale into the lungs. 2 puffs in the morning and 2 puffs at night     diltiazem (CARDIZEM CD) 120 MG 24 hr capsule TAKE 1 CAPSULE BY MOUTH EVERY DAY 90 capsule 3   famotidine (PEPCID) 40 MG/5ML suspension Take 5 mLs (40 mg total) by mouth daily. 150 mL 11   FLUoxetine (PROZAC) 40 MG capsule TAKE 1 CAPSULE BY MOUTH EVERY DAY 90 capsule 2   gabapentin (NEURONTIN) 300 MG capsule TAKE 1 CAPSULE BY MOUTH THREE TIMES A DAY 270 capsule 2   glucose blood (ONETOUCH ULTRA) test strip TEST ONCE DAILY E11.42     ipratropium-albuterol (DUONEB) 0.5-2.5 (3) MG/3ML SOLN Take 3 mLs by nebulization every 6 (six) hours as needed. 360 mL 11   nitroGLYCERIN (NITROSTAT) 0.4 MG SL tablet Place 0.4 mg under the tongue every 5 (five) minutes as needed for chest pain.     senna (SENOKOT) 8.6 MG TABS tablet Take 1 tablet by mouth.     SORBITOL PO Take by mouth.     traZODone (DESYREL) 100 MG tablet Take 2 tablets (200 mg total) by mouth at bedtime as needed for sleep. 180 tablet 3   Current Facility-Administered Medications on File Prior to Visit  Medication Dose Route Frequency Provider Last Rate Last Admin    albuterol (PROVENTIL) (2.5 MG/3ML) 0.083% nebulizer solution 2.5 mg  2.5 mg Nebulization Once Delsa Grana, PA-C       Allergies  Allergen Reactions   Actos [Pioglitazone] Swelling    EDEMA REACTION UNSPECIFIED   Avandia [Rosiglitazone] Swelling    SWELLING  REACTION UNSPECIFIED    Hydromorphone     Other reaction(s): hallucinations   Dilaudid [Hydromorphone Hcl] Other (See Comments)    Made him crazy   Social History   Socioeconomic History   Marital status: Married    Spouse name: mary lou   Number of children: 3   Years of education: trade    Highest education level: Not on file  Occupational History   Occupation: truck driver    Comment: retired  Tobacco Use   Smoking status: Every Day    Packs/day: 3.00    Years: 54.00    Total pack years: 162.00    Types: Cigarettes    Start date: 1963   Smokeless tobacco: Never   Tobacco comments:    1ppd as of 01/04/21  ep  Vaping Use   Vaping Use: Former  Substance and Sexual Activity   Alcohol use: No    Comment: no alcolol since 2013   Drug use: Yes    Types: Cocaine, Marijuana    Comment: quit 1990's   Sexual activity: Not Currently  Other Topics Concern   Not on file  Social History Narrative   Lives in [place] with wife   She is his NOK   Was a truck driver for 47 yr, 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   Social Determinants of Health   Financial Resource Strain: Low Risk  (04/20/2021)   Overall Financial Resource Strain (CARDIA)    Difficulty of Paying Living Expenses: Not hard at all  Food Insecurity: No Food Insecurity (04/20/2021)   Hunger Vital Sign    Worried About Running Out of Food in the Last Year: Never true    Ran Out of Food in the Last Year: Never true  Transportation Needs: No Transportation Needs (04/20/2021)   PRAPARE - Hydrologist (Medical): No    Lack of  Transportation (Non-Medical): No  Physical Activity: Inactive (04/20/2021)   Exercise Vital Sign    Days of Exercise per Week: 0 days    Minutes of Exercise per Session: 0 min  Stress: No Stress Concern Present (04/20/2021)   [place]    Feeling of Stress : Not at all  Social Connections: Moderately Isolated (04/20/2021)   Social Connection and Isolation Panel [NHANES]    Frequency of Communication with Friends and Family: Never    Frequency of Social Gatherings with Friends and Family: More than three times a week    Attends Religious Services: Never    Marine scientist or Organizations: No    Attends Archivist Meetings: Never    Marital Status: Married  Human resources officer Violence: Not At Risk (04/20/2021)   Humiliation, Afraid, Rape, and Kick questionnaire    Fear of Current or Ex-Partner: No    Emotionally Abused: No    Physically Abused: No    Sexually Abused: No      Review of Systems  All other systems reviewed and are negative.      Objective:   Physical Exam Vitals reviewed.  Constitutional:      General: He is not in acute distress.    Appearance: He is ill-appearing. He is not toxic-appearing.  Cardiovascular:     Rate and Rhythm: Normal rate. Rhythm irregular.     Heart sounds:  Normal heart sounds.  Pulmonary:     Effort: No tachypnea or respiratory distress.     Breath sounds: Decreased air movement present. Decreased breath sounds present. No wheezing, rhonchi or rales.  Musculoskeletal:     Cervical back: Neck supple.     Right lower leg: No edema.     Left lower leg: No edema.  Lymphadenopathy:     Cervical: No cervical adenopathy.  Neurological:     Mental Status: He is alert.           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  I am concerned by the weight loss.  I am also concerned by the melena.  Check CBC CMP lipid panel A1c urine protein to creatinine ratio.  Blood pressure today is excellent.  Goal hemoglobin A1c is less than 6.5.  I like to keep his LDL below 100.  If his blood counts are stable, I would recommend to follow-up with GI for an endoscopy given the weight loss and melena.  Patient received his flu shot today and I recommended a COVID shot

## 2022-08-24 ENCOUNTER — Other Ambulatory Visit: Payer: Self-pay

## 2022-08-24 DIAGNOSIS — E440 Moderate protein-calorie malnutrition: Secondary | ICD-10-CM

## 2022-08-24 DIAGNOSIS — D649 Anemia, unspecified: Secondary | ICD-10-CM

## 2022-08-24 LAB — PROTEIN / CREATININE RATIO, URINE
Creatinine, Urine: 70 mg/dL (ref 20–320)
Protein/Creat Ratio: 271 mg/g creat — ABNORMAL HIGH (ref 25–148)
Protein/Creatinine Ratio: 0.271 mg/mg creat — ABNORMAL HIGH (ref 0.025–0.148)
Total Protein, Urine: 19 mg/dL (ref 5–25)

## 2022-08-24 LAB — LIPID PANEL
Cholesterol: 135 mg/dL (ref <200)
HDL: 47 mg/dL (ref >=40)
LDL Cholesterol (Calc): 74 mg/dL (calc)
Non-HDL Cholesterol (Calc): 88 mg/dL (calc) (ref <130)
Total CHOL/HDL Ratio: 2.9 (calc) (ref <5.0)
Triglycerides: 67 mg/dL (ref <150)

## 2022-08-24 LAB — COMPLETE METABOLIC PANEL WITH GFR
AG Ratio: 1.7 (calc) (ref 1.0–2.5)
ALT: 9 U/L (ref 9–46)
AST: 14 U/L (ref 10–35)
Albumin: 4.1 g/dL (ref 3.6–5.1)
Alkaline phosphatase (APISO): 99 U/L (ref 35–144)
BUN: 19 mg/dL (ref 7–25)
CO2: 28 mmol/L (ref 20–32)
Calcium: 9 mg/dL (ref 8.6–10.3)
Chloride: 100 mmol/L (ref 98–110)
Creat: 1.18 mg/dL (ref 0.70–1.28)
Globulin: 2.4 g/dL (calc) (ref 1.9–3.7)
Glucose, Bld: 93 mg/dL (ref 65–99)
Potassium: 5.1 mmol/L (ref 3.5–5.3)
Sodium: 136 mmol/L (ref 135–146)
Total Bilirubin: 0.5 mg/dL (ref 0.2–1.2)
Total Protein: 6.5 g/dL (ref 6.1–8.1)
eGFR: 66 mL/min/{1.73_m2} (ref >=60)

## 2022-08-24 LAB — CBC WITH DIFFERENTIAL/PLATELET
Absolute Monocytes: 806 cells/uL (ref 200–950)
Basophils Absolute: 42 cells/uL (ref 0–200)
Basophils Relative: 0.4 %
Eosinophils Absolute: 191 cells/uL (ref 15–500)
Eosinophils Relative: 1.8 %
HCT: 39.4 % (ref 38.5–50.0)
Hemoglobin: 13.1 g/dL — ABNORMAL LOW (ref 13.2–17.1)
Lymphs Abs: 2703 cells/uL (ref 850–3900)
MCH: 30.8 pg (ref 27.0–33.0)
MCHC: 33.2 g/dL (ref 32.0–36.0)
MCV: 92.5 fL (ref 80.0–100.0)
MPV: 10.2 fL (ref 7.5–12.5)
Monocytes Relative: 7.6 %
Neutro Abs: 6858 cells/uL (ref 1500–7800)
Neutrophils Relative %: 64.7 %
Platelets: 255 10*3/uL (ref 140–400)
RBC: 4.26 10*6/uL (ref 4.20–5.80)
RDW: 12.9 % (ref 11.0–15.0)
Total Lymphocyte: 25.5 %
WBC: 10.6 10*3/uL (ref 3.8–10.8)

## 2022-08-24 LAB — MICROALBUMIN / CREATININE URINE RATIO
Creatinine, Urine: 70 mg/dL (ref 20–320)
Microalb Creat Ratio: 39 mcg/mg creat — ABNORMAL HIGH (ref <30)
Microalb, Ur: 2.7 mg/dL

## 2022-08-24 LAB — HEMOGLOBIN A1C
Hgb A1c MFr Bld: 6.2 % of total Hgb — ABNORMAL HIGH (ref <5.7)
Mean Plasma Glucose: 131 mg/dL
eAG (mmol/L): 7.3 mmol/L

## 2022-08-30 ENCOUNTER — Other Ambulatory Visit: Payer: Self-pay | Admitting: Family Medicine

## 2022-08-30 ENCOUNTER — Other Ambulatory Visit: Payer: Self-pay | Admitting: Internal Medicine

## 2022-08-30 DIAGNOSIS — J42 Unspecified chronic bronchitis: Secondary | ICD-10-CM

## 2022-08-31 ENCOUNTER — Other Ambulatory Visit: Payer: Self-pay | Admitting: Family Medicine

## 2022-08-31 ENCOUNTER — Telehealth: Payer: Self-pay

## 2022-08-31 MED ORDER — OXYCODONE-ACETAMINOPHEN 5-325 MG PO TABS: 1.0000 | ORAL_TABLET | ORAL | 0 refills | Status: AC | PRN

## 2022-08-31 NOTE — Telephone Encounter (Signed)
MEDICATION REFILL: Pt's wife [name] called stating pharmacy did not received the last RF that was sent in. Last RF was 08/17/2022. Can Rx be resent to [facility]? Thank you.

## 2022-09-18 ENCOUNTER — Other Ambulatory Visit: Payer: Self-pay | Admitting: Internal Medicine

## 2022-09-18 DIAGNOSIS — J42 Unspecified chronic bronchitis: Secondary | ICD-10-CM

## 2022-10-25 ENCOUNTER — Other Ambulatory Visit: Payer: Self-pay | Admitting: Family Medicine

## 2022-12-05 ENCOUNTER — Ambulatory Visit: Payer: PPO | Admitting: Family Medicine

## 2022-12-10 ENCOUNTER — Ambulatory Visit (INDEPENDENT_AMBULATORY_CARE_PROVIDER_SITE_OTHER): Payer: PPO | Admitting: Family Medicine

## 2022-12-10 VITALS — BP 120/62 | HR 96 | Temp 98.0°F | Ht 71.0 in | Wt 138.2 lb

## 2022-12-10 DIAGNOSIS — J189 Pneumonia, unspecified organism: Secondary | ICD-10-CM

## 2022-12-10 MED ORDER — PREDNISONE 20 MG PO TABS: ORAL_TABLET | ORAL | 0 refills | Status: DC

## 2022-12-10 MED ORDER — LEVOFLOXACIN 500 MG PO TABS: 500.0000 mg | ORAL_TABLET | Freq: Every day | ORAL | 0 refills | Status: AC

## 2022-12-10 NOTE — Progress Notes (Signed)
Subjective:    Patient ID: [name], male    DOB: 04/27/1951, 72 y.o.   MRN: [id number]  Cough   Wt Readings from Last 3 Encounters:  12/10/22 138 lb 3.2 oz (62.7 kg)  08/23/22 151 lb (68.5 kg)  02/13/22 160 lb 9.6 oz (72.8 kg)   Patient has a history of cancer in his right lung that was non-small cell lung cancer.  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  He has declined further treatment with radiation and chemotherapy.    As soon as I walk in, I am taken aback by how emaciated the patient looks.  He is lost almost 20 pounds.  He reports a 1 week history of cough and chest congestion and shortness of breath.  He reports wheezing.  Today on exam he is wheezing in all 4 lung fields.  He has rhonchorous breath sounds throughout with prominent Rales in both lungs.  Pulse oximetry is 92% on room air.  He reports fever and cough and chest congestion. Past Medical History:  Diagnosis Date   Allergy    Anemia    Anginal pain ([place])    Arthritis    "hands"   Asthma    Back pain    4 deteriorating disc and receives an injection q3-67mon;has been doing this for about 62yrs   Blood transfusion    as a child   Bronchitis    Cancer ([place]) dx'd 2013   Non-small cell lung cancer   Chronic kidney disease    acute kidney failure post surgery   COPD (chronic obstructive pulmonary disease) (HCC)    uses Albuterol and Spiriva daily   Coronary artery disease    has 1 stent   Depression    takes Prozac daily   Emphysema    sees Dr.Ramaswami for this   Family history of adverse reaction to anesthesia    mother was hard to wake up sometimes   GERD (gastroesophageal reflux disease)    takes Prilosec daily, EGD nml 06/2017   Glaucoma    hx of   H/O hiatal hernia    Hypertension    takes  HYzaar daily   Insomnia    takes Trazodone nightly   Lung mass    right upper lobe   Myocardial infarct [facility])    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   Obesity     Parathyroid disease ([place])    Periodic limb movements of sleep    Peripheral neuropathy    Pneumonia    hx of' 72 yo,rd' last time in 2013   Productive cough    white in color but no odor   Shortness of breath    with exertion    Sleep apnea    uses BiPaP; "no longer have sleep apnea since I have lost over 100 lbs"   Syncope and collapse 05/26/2013   Thyroid disease    Type II diabetes mellitus ([place])    takes Metformin bid and Novolog and Lantus daily   Past Surgical History:  Procedure Laterality Date   CARDIAC CATHETERIZATION  2006/2008/2009   CARPAL TUNNEL RELEASE     bilateral   COLONOSCOPY     CORONARY ANGIOPLASTY WITH STENT PLACEMENT     1 stent   ELBOW SURGERY     ulnar nerve; left   ESOPHAGOGASTRODUODENOSCOPY     EYE SURGERY     pt. denies   FUDUCIAL  PLACEMENT Left 08/24/2016   Procedure: PLACEMENT OF FUDUCIAL;  Surgeon: Melrose Nakayama, MD;  Location: [place];  Service: Thoracic;  Laterality: Left;   INGUINAL HERNIA REPAIR  1990's   double inguinal   LOBECTOMY  03/17/2012   upper right side with resection   MICROLARYNGOSCOPY WITH CO2 LASER AND EXCISION OF VOCAL CORD LESION N/A 06/28/2017   Procedure: MICROLARYNGOSCOPY WITH BIOPSY;  Surgeon: Melida Quitter, MD;  Location: [place];  Service: ENT;  Laterality: N/A;   MICROLARYNGOSCOPY WITH CO2 LASER AND EXCISION OF VOCAL CORD LESION N/A 07/19/2017   Procedure: MICROLARYNGOSCOPY WITH CO2 LASER AND EXCISION OF VOCAL CORD LESION;  Surgeon: Melida Quitter, MD;  Location: [place];  Service: ENT;  Laterality: N/A;  MICRO DIRECT LARYNGOSCOPY WITH CO2 LASER VOCAL CORD STRIPPING/POSS JET VENTURI VENTILATION   REFRACTIVE SURGERY     bilaterally   ROTATOR CUFF REPAIR     left   VIDEO BRONCHOSCOPY WITH ENDOBRONCHIAL NAVIGATION N/A 08/24/2016   Procedure: VIDEO BRONCHOSCOPY WITH ENDOBRONCHIAL NAVIGATION;  Surgeon: Melrose Nakayama, MD;  [facility];  Service: Thoracic;  Laterality: N/A;   Current Outpatient Medications on  File Prior to Visit  Medication Sig Dispense Refill   acetaminophen (TYLENOL) 500 MG tablet Take 1,000 mg by mouth every 6 (six) hours as needed for moderate pain.     albuterol (VENTOLIN HFA) 108 (90 Base) MCG/ACT inhaler      atorvastatin (LIPITOR) 20 MG tablet Take 1 tablet (20 mg total) by mouth daily. 90 tablet 3   Budeson-Glycopyrrol-Formoterol (BREZTRI AEROSPHERE) 160-9-4.8 MCG/ACT AERO Inhale into the lungs. 2 puffs in the morning and 2 puffs at night     diltiazem (CARDIZEM CD) 120 MG 24 hr capsule TAKE 1 CAPSULE BY MOUTH EVERY DAY 90 capsule 3   famotidine (PEPCID) 40 MG/5ML suspension Take 5 mLs (40 mg total) by mouth daily. 150 mL 11   FLUoxetine (PROZAC) 40 MG capsule TAKE 1 CAPSULE BY MOUTH EVERY DAY 90 capsule 2   gabapentin (NEURONTIN) 300 MG capsule TAKE 1 CAPSULE BY MOUTH THREE TIMES A DAY 270 capsule 2   glucose blood (ONETOUCH ULTRA) test strip TEST ONCE DAILY E11.42     ipratropium-albuterol (DUONEB) 0.5-2.5 (3) MG/3ML SOLN USE 1 VIAL VIA NEBULIZER EVERY 6 HOURS AS NEEDED 360 mL 11   nitroGLYCERIN (NITROSTAT) 0.4 MG SL tablet Place 0.4 mg under the tongue every 5 (five) minutes as needed for chest pain.     senna (SENOKOT) 8.6 MG TABS tablet Take 1 tablet by mouth.     SORBITOL PO Take by mouth.     traZODone (DESYREL) 100 MG tablet Take 2 tablets (200 mg total) by mouth at bedtime as needed for sleep. 180 tablet 3   Current Facility-Administered Medications on File Prior to Visit  Medication Dose Route Frequency Provider Last Rate Last Admin   albuterol (PROVENTIL) (2.5 MG/3ML) 0.083% nebulizer solution 2.5 mg  2.5 mg Nebulization Once Delsa Grana, PA-C       Allergies  Allergen Reactions   Actos [Pioglitazone] Swelling    EDEMA REACTION UNSPECIFIED   Avandia [Rosiglitazone] Swelling    SWELLING REACTION UNSPECIFIED    Hydromorphone     Other reaction(s): hallucinations   Dilaudid [Hydromorphone Hcl] Other (See Comments)    Made him crazy   Social History    Socioeconomic History   Marital status: Married    Spouse name: mary lou   Number of children: 3   Years of education: trade  Highest education level: Not on file  Occupational History   Occupation: truck driver    Comment: retired  Tobacco Use   Smoking status: Every Day    Packs/day: 3.00    Years: 54.00    Total pack years: 162.00    Types: Cigarettes    Start date: 1963   Smokeless tobacco: Never   Tobacco comments:    1ppd as of 01/04/21  ep  Vaping Use   Vaping Use: Former  Substance and Sexual Activity   Alcohol use: No    Comment: no alcolol since 2013   Drug use: Yes    Types: Cocaine, Marijuana    Comment: quit 1990's   Sexual activity: Not Currently  Other Topics Concern   Not on file  Social History Narrative   Lives in [place] with wife   She is his NOK   Was a truck driver for 43 yr, 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   Social Determinants of Health   Financial Resource Strain: Low Risk  (04/20/2021)   Overall Financial Resource Strain (CARDIA)    Difficulty of Paying Living Expenses: Not hard at all  Food Insecurity: No Food Insecurity (04/20/2021)   Hunger Vital Sign    Worried About Running Out of Food in the Last Year: Never true    Ran Out of Food in the Last Year: Never true  Transportation Needs: No Transportation Needs (04/20/2021)   PRAPARE - Hydrologist (Medical): No    Lack of Transportation (Non-Medical): No  Physical Activity: Inactive (04/20/2021)   Exercise Vital Sign    Days of Exercise per Week: 0 days    Minutes of Exercise per Session: 0 min  Stress: No Stress Concern Present (04/20/2021)   [place]    Feeling of Stress : Not at all  Social Connections: Moderately Isolated (04/20/2021)   Social Connection and Isolation Panel [NHANES]     Frequency of Communication with Friends and Family: Never    Frequency of Social Gatherings with Friends and Family: More than three times a week    Attends Religious Services: Never    Marine scientist or Organizations: No    Attends Archivist Meetings: Never    Marital Status: Married  Human resources officer Violence: Not At Risk (04/20/2021)   Humiliation, Afraid, Rape, and Kick questionnaire    Fear of Current or Ex-Partner: No    Emotionally Abused: No    Physically Abused: No    Sexually Abused: No      Review of Systems  Respiratory:  Positive for cough.   All other systems reviewed and are negative.      Objective:   Physical Exam Vitals reviewed.  Constitutional:      General: He is not in acute distress.    Appearance: He is ill-appearing. He is not toxic-appearing.  Cardiovascular:     Rate and Rhythm: Normal rate. Rhythm irregular.     Heart sounds: Normal heart sounds.  Pulmonary:     Effort: No tachypnea or respiratory distress.     Breath sounds: Decreased air movement present. Examination of the right-upper field reveals wheezing. Examination of the left-upper field reveals wheezing. Examination of the right-middle field reveals rhonchi. Examination of the left-middle field reveals rhonchi. Examination of  the right-lower field reveals wheezing and rhonchi. Examination of the left-lower field reveals wheezing, rhonchi and rales. Decreased breath sounds, wheezing, rhonchi and rales present.  Musculoskeletal:     Cervical back: Neck supple.     Right lower leg: No edema.     Left lower leg: No edema.  Lymphadenopathy:     Cervical: No cervical adenopathy.  Neurological:     Mental Status: He is alert.           Assessment & Plan:  Community acquired pneumonia of left lower lobe of lung - Plan: DG Chest 2 View I am concerned the patient has community-acquired pneumonia as well as a COPD exacerbation.  Obtain a chest x-ray immediately.   Patient refuses to go to the hospital.  Start the patient on albuterol 2 puffs every 4-6 hours.  Begin a prednisone taper pack.  Begin Levaquin 500 milligrams daily for 7 days.  If worsening strongly recommended he go to the hospital.  Recheck the patient later this week.  At that time if he is doing better we will discuss whether he requires laryngoscopy given the weight loss and his history of cancer of the glottis

## 2022-12-11 ENCOUNTER — Ambulatory Visit
Admission: RE | Admit: 2022-12-11 | Discharge: 2022-12-11 | Disposition: A | Discharge status: Home / Self Care | Payer: PPO | Source: Ambulatory Visit | Attending: Family Medicine | Admitting: Family Medicine

## 2022-12-11 DIAGNOSIS — R059 Cough, unspecified: Secondary | ICD-10-CM | POA: Diagnosis not present

## 2022-12-11 DIAGNOSIS — J189 Pneumonia, unspecified organism: Secondary | ICD-10-CM

## 2022-12-14 ENCOUNTER — Encounter: Payer: Self-pay | Admitting: Family Medicine

## 2022-12-14 ENCOUNTER — Ambulatory Visit (INDEPENDENT_AMBULATORY_CARE_PROVIDER_SITE_OTHER): Payer: PPO | Admitting: Family Medicine

## 2022-12-14 VITALS — BP 120/62 | HR 74 | Temp 97.7°F | Ht 71.0 in | Wt 139.4 lb

## 2022-12-14 DIAGNOSIS — J189 Pneumonia, unspecified organism: Secondary | ICD-10-CM | POA: Diagnosis not present

## 2022-12-14 DIAGNOSIS — R09A2 Foreign body sensation, throat: Secondary | ICD-10-CM

## 2022-12-14 NOTE — Progress Notes (Signed)
Subjective:    Patient ID: [name], male    DOB: 1951/10/17, 72 y.o.   MRN: [id number]  Cough  12/10/22 Wt Readings from Last 3 Encounters:  12/10/22 138 lb 3.2 oz (62.7 kg)  08/23/22 151 lb (68.5 kg)  02/13/22 160 lb 9.6 oz (72.8 kg)   Patient has a history of cancer in his right lung that was non-small cell lung cancer.  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  He has declined further treatment with radiation and chemotherapy.    As soon as I walk in, I am taken aback by how emaciated the patient looks.  He is lost almost 20 pounds.  He reports a 1 week history of cough and chest congestion and shortness of breath.  He reports wheezing.  Today on exam he is wheezing in all 4 lung fields.  He has rhonchorous breath sounds throughout with prominent Rales in both lungs.  Pulse oximetry is 92% on room air.  He reports fever and cough and chest congestion.  At that time, my plan was:  I am concerned the patient has community-acquired pneumonia as well as a COPD exacerbation.  Obtain a chest x-ray immediately.  Patient refuses to go to the hospital.  Start the patient on albuterol 2 puffs every 4-6 hours.  Begin a prednisone taper pack.  Begin Levaquin 500 milligrams daily for 7 days.  If worsening strongly recommended he go to the hospital.  Recheck the patient later this week.  At that time if he is doing better we will discuss whether he requires laryngoscopy given the weight loss and his history of cancer of the glottis  12/14/22 CXR showed no pneumonia despite his pulmonary exam.  Here for follow up.  Patient states his breathing has improved dramatically on 2 days ago.  He denies any fevers or chills.  His pulse oximetry is improved to 99% on room air.  He denies any hemoptysis.  His chest congestion has improved approximately 90%.  The crackles and rhonchi have cleared.  The wheezing has improved.  Therefore we discussed further the trouble he is having.  He reports a globus  sensation in his upper [place] at all times.  He also reports dysphagia with swallowing which is why he believes he has lost weight. Past Medical History:  Diagnosis Date   Allergy    Anemia    Anginal pain ([place])    Arthritis    "hands"   Asthma    Back pain    4 deteriorating disc and receives an injection q3-55mon;has been doing this for about 63yrs   Blood transfusion    as a child   Bronchitis    Cancer ([place]) dx'd 2013   Non-small cell lung cancer   Chronic kidney disease    acute kidney failure post surgery   COPD (chronic obstructive pulmonary disease) (HCC)    uses Albuterol and Spiriva daily   Coronary artery disease    has 1 stent   Depression    takes Prozac daily   Emphysema    sees Dr.Ramaswami for this   Family history of adverse reaction to anesthesia    mother was hard to wake up sometimes   GERD (gastroesophageal reflux disease)    takes Prilosec daily, EGD nml 06/2017   Glaucoma    hx of   H/O hiatal hernia    Hypertension    takes  HYzaar daily   Insomnia  takes Trazodone nightly   Lung mass    right upper lobe   Myocardial infarct [facility])    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   Obesity    Parathyroid disease ([place])    Periodic limb movements of sleep    Peripheral neuropathy    Pneumonia    hx of' 72 yo,rd' last time in 2013   Productive cough    white in color but no odor   Shortness of breath    with exertion    Sleep apnea    uses BiPaP; "no longer have sleep apnea since I have lost over 100 lbs"   Syncope and collapse 05/26/2013   Thyroid disease    Type II diabetes mellitus ([place])    takes Metformin bid and Novolog and Lantus daily   Past Surgical History:  Procedure Laterality Date   CARDIAC CATHETERIZATION  2006/2008/2009   CARPAL TUNNEL RELEASE     bilateral   COLONOSCOPY     CORONARY ANGIOPLASTY WITH STENT PLACEMENT     1 stent   ELBOW SURGERY     ulnar nerve; left   ESOPHAGOGASTRODUODENOSCOPY      EYE SURGERY     pt. denies   FUDUCIAL PLACEMENT Left 08/24/2016   Procedure: PLACEMENT OF FUDUCIAL;  Surgeon: Melrose Nakayama, MD;  Location: [place];  Service: Thoracic;  Laterality: Left;   INGUINAL HERNIA REPAIR  1990's   double inguinal   LOBECTOMY  03/17/2012   upper right side with resection   MICROLARYNGOSCOPY WITH CO2 LASER AND EXCISION OF VOCAL CORD LESION N/A 06/28/2017   Procedure: MICROLARYNGOSCOPY WITH BIOPSY;  Surgeon: Melida Quitter, MD;  Location: [place];  Service: ENT;  Laterality: N/A;   MICROLARYNGOSCOPY WITH CO2 LASER AND EXCISION OF VOCAL CORD LESION N/A 07/19/2017   Procedure: MICROLARYNGOSCOPY WITH CO2 LASER AND EXCISION OF VOCAL CORD LESION;  Surgeon: Melida Quitter, MD;  Location: [place];  Service: ENT;  Laterality: N/A;  MICRO DIRECT LARYNGOSCOPY WITH CO2 LASER VOCAL CORD STRIPPING/POSS JET VENTURI VENTILATION   REFRACTIVE SURGERY     bilaterally   ROTATOR CUFF REPAIR     left   VIDEO BRONCHOSCOPY WITH ENDOBRONCHIAL NAVIGATION N/A 08/24/2016   Procedure: VIDEO BRONCHOSCOPY WITH ENDOBRONCHIAL NAVIGATION;  Surgeon: Melrose Nakayama, MD;  Location: [facility];  Service: Thoracic;  Laterality: N/A;   Current Outpatient Medications on File Prior to Visit  Medication Sig Dispense Refill   acetaminophen (TYLENOL) 500 MG tablet Take 1,000 mg by mouth every 6 (six) hours as needed for moderate pain.     albuterol (VENTOLIN HFA) 108 (90 Base) MCG/ACT inhaler      atorvastatin (LIPITOR) 20 MG tablet Take 1 tablet (20 mg total) by mouth daily. 90 tablet 3   Budeson-Glycopyrrol-Formoterol (BREZTRI AEROSPHERE) 160-9-4.8 MCG/ACT AERO Inhale into the lungs. 2 puffs in the morning and 2 puffs at night     diltiazem (CARDIZEM CD) 120 MG 24 hr capsule TAKE 1 CAPSULE BY MOUTH EVERY DAY 90 capsule 3   famotidine (PEPCID) 40 MG/5ML suspension Take 5 mLs (40 mg total) by mouth daily. 150 mL 11   FLUoxetine (PROZAC) 40 MG capsule TAKE 1 CAPSULE BY MOUTH EVERY DAY 90 capsule 2   gabapentin  (NEURONTIN) 300 MG capsule TAKE 1 CAPSULE BY MOUTH THREE TIMES A DAY 270 capsule 2   glucose blood (ONETOUCH ULTRA) test strip TEST ONCE DAILY E11.42     ipratropium-albuterol (DUONEB) 0.5-2.5 (3) MG/3ML SOLN USE  1 VIAL VIA NEBULIZER EVERY 6 HOURS AS NEEDED 360 mL 11   levofloxacin (LEVAQUIN) 500 MG tablet Take 1 tablet (500 mg total) by mouth daily for 7 days. 7 tablet 0   nitroGLYCERIN (NITROSTAT) 0.4 MG SL tablet Place 0.4 mg under the tongue every 5 (five) minutes as needed for chest pain.     predniSONE (DELTASONE) 20 MG tablet 3 tabs poqday 1-2, 2 tabs poqday 3-4, 1 tab poqday 5-6 12 tablet 0   senna (SENOKOT) 8.6 MG TABS tablet Take 1 tablet by mouth.     SORBITOL PO Take by mouth.     traZODone (DESYREL) 100 MG tablet Take 2 tablets (200 mg total) by mouth at bedtime as needed for sleep. 180 tablet 3   Current Facility-Administered Medications on File Prior to Visit  Medication Dose Route Frequency Provider Last Rate Last Admin   albuterol (PROVENTIL) (2.5 MG/3ML) 0.083% nebulizer solution 2.5 mg  2.5 mg Nebulization Once Delsa Grana, PA-C       Allergies  Allergen Reactions   Actos [Pioglitazone] Swelling    EDEMA REACTION UNSPECIFIED   Avandia [Rosiglitazone] Swelling    SWELLING REACTION UNSPECIFIED    Hydromorphone     Other reaction(s): hallucinations   Dilaudid [Hydromorphone Hcl] Other (See Comments)    Made him crazy   Social History   Socioeconomic History   Marital status: Married    Spouse name: mary lou   Number of children: 3   Years of education: trade    Highest education level: Not on file  Occupational History   Occupation: truck Geophysicist/field seismologist    Comment: retired  Tobacco Use   Smoking status: Every Day    Packs/day: 3.00    Years: 54.00    Total pack years: 162.00    Types: Cigarettes    Start date: 1963   Smokeless tobacco: Never   Tobacco comments:    1ppd as of 01/04/21  ep  Vaping Use   Vaping Use: Former  Substance and Sexual Activity   Alcohol  use: No    Comment: no alcolol since 2013   Drug use: Yes    Types: Cocaine, Marijuana    Comment: quit 1990's   Sexual activity: Not Currently  Other Topics Concern   Not on file  Social History Narrative   Lives in [place] with wife   She is his NOK   Was a truck driver for 13 yr, 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   Social Determinants of Health   Financial Resource Strain: Low Risk  (04/20/2021)   Overall Financial Resource Strain (CARDIA)    Difficulty of Paying Living Expenses: Not hard at all  Food Insecurity: No Food Insecurity (04/20/2021)   Hunger Vital Sign    Worried About Running Out of Food in the Last Year: Never true    Ran Out of Food in the Last Year: Never true  Transportation Needs: No Transportation Needs (04/20/2021)   PRAPARE - Hydrologist (Medical): No    Lack of Transportation (Non-Medical): No  Physical Activity: Inactive (04/20/2021)   Exercise Vital Sign    Days of Exercise per Week: 0 days    Minutes of Exercise per Session: 0 min  Stress: No Stress Concern Present (04/20/2021)   DISH  Feeling of Stress : Not at all  Social Connections: Moderately Isolated (04/20/2021)   Social Connection and Isolation Panel [NHANES]    Frequency of Communication with Friends and Family: Never    Frequency of Social Gatherings with Friends and Family: More than three times a week    Attends Religious Services: Never    Marine scientist or Organizations: No    Attends Archivist Meetings: Never    Marital Status: Married  Human resources officer Violence: Not At Risk (04/20/2021)   Humiliation, Afraid, Rape, and Kick questionnaire    Fear of Current or Ex-Partner: No    Emotionally Abused: No    Physically Abused: No    Sexually Abused: No       Review of Systems  Respiratory:  Positive for cough.   All other systems reviewed and are negative.      Objective:   Physical Exam Vitals reviewed.  Constitutional:      General: He is not in acute distress.    Appearance: He is not ill-appearing or toxic-appearing.  Cardiovascular:     Rate and Rhythm: Normal rate. Rhythm irregular.     Heart sounds: Normal heart sounds.  Pulmonary:     Effort: No tachypnea or respiratory distress.     Breath sounds: Decreased air movement present. Decreased breath sounds present. No wheezing, rhonchi or rales.  Musculoskeletal:     Cervical back: Neck supple.     Right lower leg: No edema.     Left lower leg: No edema.  Lymphadenopathy:     Cervical: No cervical adenopathy.  Neurological:     Mental Status: He is alert.           Assessment & Plan:  Globus sensation - Plan: Ambulatory referral to ENT  Community acquired pneumonia of left lower lobe of lung Patient's breathing has improved dramatically.  Have asked him to finish the Levaquin and prednisone.  At this point we will go to refer to ENT for laryngoscopy given the globus sensation given his previous history of a glottic tumor.  If laryngoscopy is clear and there is no recurrence, I plan to proceed with a GI consult for EGD and dilatation of what I suspect to be an esophageal stricture.  However I would like him to see ENT first to make sure there is no local recurrence.

## 2022-12-18 DIAGNOSIS — E1142 Type 2 diabetes mellitus with diabetic polyneuropathy: Secondary | ICD-10-CM | POA: Diagnosis not present

## 2022-12-18 DIAGNOSIS — R634 Abnormal weight loss: Secondary | ICD-10-CM | POA: Diagnosis not present

## 2022-12-23 ENCOUNTER — Other Ambulatory Visit: Payer: Self-pay | Admitting: Family Medicine

## 2022-12-31 ENCOUNTER — Encounter: Payer: Self-pay | Admitting: Cardiology

## 2022-12-31 ENCOUNTER — Ambulatory Visit: Payer: PPO | Attending: Cardiology | Admitting: Cardiology

## 2022-12-31 VITALS — BP 124/68 | HR 84 | Ht 71.0 in | Wt 139.4 lb

## 2022-12-31 DIAGNOSIS — I2510 Atherosclerotic heart disease of native coronary artery without angina pectoris: Secondary | ICD-10-CM

## 2022-12-31 DIAGNOSIS — E1159 Type 2 diabetes mellitus with other circulatory complications: Secondary | ICD-10-CM

## 2022-12-31 DIAGNOSIS — G4733 Obstructive sleep apnea (adult) (pediatric): Secondary | ICD-10-CM | POA: Diagnosis not present

## 2022-12-31 DIAGNOSIS — Z794 Long term (current) use of insulin: Secondary | ICD-10-CM | POA: Diagnosis not present

## 2022-12-31 DIAGNOSIS — I10 Essential (primary) hypertension: Secondary | ICD-10-CM

## 2022-12-31 DIAGNOSIS — R55 Syncope and collapse: Secondary | ICD-10-CM

## 2022-12-31 NOTE — Patient Instructions (Signed)
Medication Instructions:  The current medical regimen is effective;  continue present plan and medications.  *If you need a refill on your cardiac medications before your next appointment, please call your pharmacy*   Follow-Up: At [facility], you and your health needs are our priority.  As part of our continuing mission to provide you with exceptional heart care, we have created designated Provider Care Teams.  These Care Teams include your primary Cardiologist (physician) and Advanced Practice Providers (APPs -  Physician Assistants and Nurse Practitioners) who all work together to provide you with the care you need, when you need it.  We recommend signing up for the patient portal called "MyChart".  Sign up information is provided on this After Visit Summary.  MyChart is used to connect with patients for Virtual Visits (Telemedicine).  Patients are able to view lab/test results, encounter notes, upcoming appointments, etc.  Non-urgent messages can be sent to your provider as well.   To learn more about what you can do with MyChart, go to [email or web address].    Your next appointment:   1 year(s)  Provider:   Dr Candee Furbish

## 2022-12-31 NOTE — Progress Notes (Signed)
Cardiology Office Note:    Date:  12/31/2022   ID:  [name], DOB [birth year], MRN [id number]  PCP:  [name], MD   [place] Providers Cardiologist:  Candee Furbish, MD     Referring MD: [name], MD    History of Present Illness:    [name] is a 72 y.o. male former patient of Dr. Mallie Mussel Smith's with follow-up for coronary artery disease RCA DES remote, partial right lung resection lung cancer, hypertension, smoker, COPD.  Previously was on hospice but this was discontinued after he significantly improved.  While he was on hospice medication such as atorvastatin was discontinued.  This was restarted previously by Dr. Tamala Julian.  He is not on an aspirin because of bruising.  Overall he denies any chest pain.  He is lost a significant amount of weight from his peak in the 220s.  Past Medical History:  Diagnosis Date   Allergy    Anemia    Anginal pain ([place])    Arthritis    "hands"   Asthma    Back pain    4 deteriorating disc and receives an injection q3-87mo;has been doing this for about 462yr  Blood transfusion    as a child   Bronchitis    [place] [place]   Non-small cell lung cancer   Chronic kidney disease    acute kidney failure post surgery   COPD (chronic obstructive pulmonary disease) (HCC)    uses Albuterol and Spiriva daily   Coronary artery disease    has 1 stent   Depression    takes Prozac daily   Emphysema    sees Dr.Ramaswami for this   Family history of adverse reaction to anesthesia    mother was hard to wake up sometimes   GERD (gastroesophageal reflux disease)    takes Prilosec daily, EGD nml 06/2017   Glaucoma    hx of   H/O hiatal hernia    Hypertension    takes  HYzaar daily   Insomnia    takes Trazodone nightly   Lung mass    right upper lobe   [facility]   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   Obesity    Parathyroid disease ([facility]   Periodic  limb movements of sleep    Peripheral neuropathy    Pneumonia    hx of' 72 yo,rd' last time in 2013   Productive cough    white in color but no odor   Shortness of breath    with exertion    Sleep apnea    uses BiPaP; "no longer have sleep apnea since I have lost over 100 lbs"   Syncope and collapse 05/26/2013   Thyroid disease    Type II diabetes mellitus ([facility]   takes Metformin bid and Novolog and Lantus daily    Past Surgical History:  Procedure Laterality Date   CARDIAC CATHETERIZATION  2006/2008/2009   CARPAL TUNNEL RELEASE     bilateral   COLONOSCOPY     CORONARY ANGIOPLASTY WITH STENT PLACEMENT     1 stent   ELBOW SURGERY     ulnar nerve; left   ESOPHAGOGASTRODUODENOSCOPY     EYE SURGERY     pt. denies   FUDUCIAL PLACEMENT Left 08/24/2016   Procedure: PLACEMENT OF FUDUCIAL;  Surgeon: StMelrose NakayamaMD;  [facility] Service: [facility];  Laterality: Left;  INGUINAL HERNIA REPAIR  1990's   double inguinal   LOBECTOMY  03/17/2012   upper right side with resection   MICROLARYNGOSCOPY WITH CO2 LASER AND EXCISION OF VOCAL CORD LESION N/A 06/28/2017   Procedure: MICROLARYNGOSCOPY WITH BIOPSY;  Surgeon: Melida Quitter, MD;  Location: [place];  Service: ENT;  Laterality: N/A;   MICROLARYNGOSCOPY WITH CO2 LASER AND EXCISION OF VOCAL CORD LESION N/A 07/19/2017   Procedure: MICROLARYNGOSCOPY WITH CO2 LASER AND EXCISION OF VOCAL CORD LESION;  Surgeon: Melida Quitter, MD;  Location: [facility];  Service: ENT;  Laterality: N/A;  MICRO DIRECT LARYNGOSCOPY WITH CO2 LASER VOCAL CORD STRIPPING/POSS JET VENTURI VENTILATION   REFRACTIVE SURGERY     bilaterally   ROTATOR CUFF REPAIR     left   VIDEO BRONCHOSCOPY WITH ENDOBRONCHIAL NAVIGATION N/A 08/24/2016   Procedure: VIDEO BRONCHOSCOPY WITH ENDOBRONCHIAL NAVIGATION;  Surgeon: Melrose Nakayama, MD;  [facility];  Service: Thoracic;  Laterality: N/A;    Current Medications: Current Meds  Medication Sig   acetaminophen  (TYLENOL) 500 MG tablet Take 1,000 mg by mouth every 6 (six) hours as needed for moderate pain.   albuterol (VENTOLIN HFA) 108 (90 Base) MCG/ACT inhaler Inhale 2 puffs into the lungs every 4 (four) hours as needed for shortness of breath or wheezing.   atorvastatin (LIPITOR) 20 MG tablet Take 1 tablet (20 mg total) by mouth daily.   Budeson-Glycopyrrol-Formoterol (BREZTRI AEROSPHERE) 160-9-4.8 MCG/ACT AERO Inhale into the lungs. 2 puffs in the morning and 2 puffs at night   FLUoxetine (PROZAC) 40 MG capsule TAKE 1 CAPSULE BY MOUTH EVERY DAY   gabapentin (NEURONTIN) 300 MG capsule TAKE 1 CAPSULE BY MOUTH THREE TIMES A DAY   glucose blood (ONETOUCH ULTRA) test strip TEST ONCE DAILY E11.42   ipratropium-albuterol (DUONEB) 0.5-2.5 (3) MG/3ML SOLN USE 1 VIAL VIA NEBULIZER EVERY 6 HOURS AS NEEDED   nitroGLYCERIN (NITROSTAT) 0.4 MG SL tablet Place 0.4 mg under the tongue every 5 (five) minutes as needed for chest pain.   traZODone (DESYREL) 100 MG tablet TAKE 2 TABLETS (200 MG TOTAL) BY MOUTH AT BEDTIME AS NEEDED FOR SLEEP   Current Facility-Administered Medications for the 12/31/22 encounter (Office Visit) with Jerline Pain, MD  Medication   albuterol (PROVENTIL) (2.5 MG/3ML) 0.083% nebulizer solution 2.5 mg     Allergies:   Actos [pioglitazone], Avandia [rosiglitazone], Hydromorphone, and Dilaudid [hydromorphone hcl]   Social History   Socioeconomic History   Marital status: Married    Spouse name: mary lou   Number of children: 3   Years of education: trade    Highest education level: Not on file  Occupational History   Occupation: truck Geophysicist/field seismologist    Comment: retired  Tobacco Use   Smoking status: Every Day    Packs/day: 3.00    Years: 54.00    Total pack years: 162.00    Types: Cigarettes    Start date: 1963   Smokeless tobacco: Never   Tobacco comments:    1ppd as of 01/04/21  ep  Vaping Use   Vaping Use: Former  Substance and Sexual Activity   Alcohol use: No    Comment: no  alcolol since 2013   Drug use: Yes    Types: Cocaine, Marijuana    Comment: quit 1990's   Sexual activity: Not Currently  Other Topics Concern   Not on file  Social History Narrative   Lives in [place] with wife   She is his NOK   Was a truck  driver for 6 yr, 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   Social Determinants of Health   Financial Resource Strain: Low Risk  (04/20/2021)   Overall Financial Resource Strain (CARDIA)    Difficulty of Paying Living Expenses: Not hard at all  Food Insecurity: No Food Insecurity (04/20/2021)   Hunger Vital Sign    Worried About Running Out of Food in the Last Year: Never true    Ran Out of Food in the Last Year: Never true  Transportation Needs: No Transportation Needs (04/20/2021)   PRAPARE - Hydrologist (Medical): No    Lack of Transportation (Non-Medical): No  Physical Activity: Inactive (04/20/2021)   Exercise Vital Sign    Days of Exercise per Week: 0 days    Minutes of Exercise per Session: 0 min  Stress: No Stress Concern Present (04/20/2021)   [place]    Feeling of Stress : Not at all  Social Connections: Moderately Isolated (04/20/2021)   Social Connection and Isolation Panel [NHANES]    Frequency of Communication with Friends and Family: Never    Frequency of Social Gatherings with Friends and Family: More than three times a week    Attends Religious Services: Never    Marine scientist or Organizations: No    Attends Music therapist: Never    Marital Status: Married     Family History: The patient's family history includes COPD in his mother; Diabetes in his father and mother; Heart attack in his father; Hypertension in his mother. There is no history of Anesthesia problems, Hypotension, Malignant hyperthermia, or  Pseudochol deficiency.  ROS:   Please see the history of present illness.     All other systems reviewed and are negative.  EKGs/Labs/Other Studies Reviewed:    The following studies were reviewed today: Prior echocardiogram 2014  Chest CT 03/12/22: 1. Stable CT of the chest status post right upper lobectomy. No specific findings of recurrent or metastatic disease in the chest. 2. Stable post radiation consolidation within the superior segment of left lower lobe. 3. Coronary artery calcifications. 4. Aortic Atherosclerosis (ICD10-I70.0) and Emphysema (ICD10-J43.9).  EKG: 12/31/2022: Sinus rhythm PACs 84 bpm borderline LVH.  Recent Labs: 08/23/2022: ALT 9; BUN 19; Creat 1.18; Hemoglobin 13.1; Platelets 255; Potassium 5.1; Sodium 136  Recent Lipid Panel    Component Value Date/Time   CHOL 135 08/23/2022 0833   TRIG 67 08/23/2022 0833   HDL 47 08/23/2022 0833   CHOLHDL 2.9 08/23/2022 0833   VLDL 20 05/10/2017 0850   LDLCALC 74 08/23/2022 0833     Risk Assessment/Calculations:               Physical Exam:    VS:  BP 124/68   Pulse 84   Ht '5\' 11"'$  (1.803 m)   Wt 139 lb 6.4 oz (63.2 kg)   SpO2 96%   BMI 19.44 kg/m     Wt Readings from Last 3 Encounters:  12/31/22 139 lb 6.4 oz (63.2 kg)  12/14/22 139 lb 6.4 oz (63.2 kg)  12/10/22 138 lb 3.2 oz (62.7 kg)     GEN:  Thin in no acute distress, raspy voice HEENT: Normal NECK: No JVD; No carotid bruits LYMPHATICS: No lymphadenopathy CARDIAC: RRR, no murmurs, rubs, gallops RESPIRATORY:  Clear to  auscultation without rales, wheezing or rhonchi  ABDOMEN: Soft, non-tender, non-distended MUSCULOSKELETAL:  No edema; No deformity  SKIN: Warm and dry NEUROLOGIC:  Alert and oriented x 3 PSYCHIATRIC:  Normal affect   ASSESSMENT:    1. Neurocardiogenic syncope   2. Coronary artery disease involving native coronary artery of native heart without angina pectoris   3. OSA on CPAP   4. Essential hypertension   5. Type 2  diabetes mellitus with other circulatory complication, with long-term current use of insulin (HCC)    PLAN:    In order of problems listed above:  Lung cancer status post right upper lobectomy - No recurrence on CT scan.  Previously on hospice but this was discontinued.  Neurocardiogenic syncope - No recent episodes, it has been many years   Coronary artery disease - Remote coronary stent, atorvastatin 20 mg was previously resumed in March or 2023 after hospice was discontinued.  Continue with secondary prevention.  Prior LDL 74.  He is not on aspirin because of bruising.  Obstructive sleep apnea - No longer on CPAP  Diabetes with hypertension - Prior hemoglobin A1c 6.5 in February 2023.  COPD - Has been an environment of smoking.  Note from Dr. Dennard Schaumann on 12/14/2022 demonstrates weight loss 20 pounds, appears emaciated, wheezing.  Recommendation was for hospitalization but refused to go.  Has globus sensation in his upper larynx at all times.  Dysphagia trouble swallowing.  He improved fairly dramatically after prednisone and Levaquin.  ENT referral was placed at that time.          Medication Adjustments/Labs and Tests Ordered: Current medicines are reviewed at length with the patient today.  Concerns regarding medicines are outlined above.  Orders Placed This Encounter  Procedures   EKG 12-Lead   No orders of the defined types were placed in this encounter.   Patient Instructions  Medication Instructions:  The current medical regimen is effective;  continue present plan and medications.  *If you need a refill on your cardiac medications before your next appointment, please call your pharmacy*   Follow-Up: At [facility], you and your health needs are our priority.  As part of our continuing mission to provide you with exceptional heart care, we have created designated Provider Care Teams.  These Care Teams include your primary Cardiologist (physician) and  Advanced Practice Providers (APPs -  Physician Assistants and Nurse Practitioners) who all work together to provide you with the care you need, when you need it.  We recommend signing up for the patient portal called "MyChart".  Sign up information is provided on this After Visit Summary.  MyChart is used to connect with patients for Virtual Visits (Telemedicine).  Patients are able to view lab/test results, encounter notes, upcoming appointments, etc.  Non-urgent messages can be sent to your provider as well.   To learn more about what you can do with MyChart, go to [email or web address].    Your next appointment:   1 year(s)  Provider:   Dr Candee Furbish        Signed, Candee Furbish, MD  12/31/2022 9:11 AM    [place]

## 2023-01-10 DIAGNOSIS — R1314 Dysphagia, pharyngoesophageal phase: Secondary | ICD-10-CM | POA: Diagnosis not present

## 2023-01-10 DIAGNOSIS — Z8521 Personal history of malignant neoplasm of larynx: Secondary | ICD-10-CM | POA: Diagnosis not present

## 2023-01-23 ENCOUNTER — Other Ambulatory Visit: Payer: Self-pay | Admitting: Otolaryngology

## 2023-01-23 DIAGNOSIS — Z8521 Personal history of malignant neoplasm of larynx: Secondary | ICD-10-CM

## 2023-01-23 DIAGNOSIS — R1314 Dysphagia, pharyngoesophageal phase: Secondary | ICD-10-CM

## 2023-01-28 ENCOUNTER — Other Ambulatory Visit: Payer: Self-pay | Admitting: *Deleted

## 2023-01-28 MED ORDER — ATORVASTATIN CALCIUM 20 MG PO TABS: 20.0000 mg | ORAL_TABLET | Freq: Every day | ORAL | 3 refills | Status: DC

## 2023-02-01 ENCOUNTER — Other Ambulatory Visit: Payer: Self-pay | Admitting: Otolaryngology

## 2023-02-01 ENCOUNTER — Ambulatory Visit (HOSPITAL_COMMUNITY)
Admission: RE | Admit: 2023-02-01 | Discharge: 2023-02-01 | Disposition: A | Discharge status: Home / Self Care | Payer: PPO | Source: Ambulatory Visit | Attending: Otolaryngology | Admitting: Otolaryngology

## 2023-02-01 DIAGNOSIS — R1314 Dysphagia, pharyngoesophageal phase: Secondary | ICD-10-CM | POA: Diagnosis not present

## 2023-02-01 DIAGNOSIS — K219 Gastro-esophageal reflux disease without esophagitis: Secondary | ICD-10-CM | POA: Diagnosis not present

## 2023-02-01 DIAGNOSIS — Z8521 Personal history of malignant neoplasm of larynx: Secondary | ICD-10-CM

## 2023-03-07 NOTE — Progress Notes (Deleted)
Subjective:   [name] is a 72 y.o. male who presents for Medicare Annual/Subsequent preventive examination.  Review of Systems    ***       Objective:    There were no vitals filed for this visit. There is no height or weight on file to calculate BMI.     04/20/2021    2:10 PM 03/20/2021    3:14 PM 03/20/2021   12:30 PM 02/02/2020   11:28 AM 01/13/2019   10:40 AM 06/25/2018   10:17 AM 11/13/2017   10:15 AM  Advanced Directives  Does Patient Have a Medical Advance Directive? Yes Yes No No No No No  Type of Estate agent of [place];Living will Healthcare Power of [place];Living will       Does patient want to make changes to medical advance directive? No - Patient declined No - Patient declined       Copy of Healthcare Power of Attorney in Chart? Yes - validated most recent copy scanned in chart (See row information) No - copy requested       Would patient like information on creating a medical advance directive?    No - Patient declined No - Patient declined      Current Medications (verified) Outpatient Encounter Medications as of 03/07/2023  Medication Sig   acetaminophen (TYLENOL) 500 MG tablet Take 1,000 mg by mouth every 6 (six) hours as needed for moderate pain.   albuterol (VENTOLIN HFA) 108 (90 Base) MCG/ACT inhaler Inhale 2 puffs into the lungs every 4 (four) hours as needed for shortness of breath or wheezing.   atorvastatin (LIPITOR) 20 MG tablet Take 1 tablet (20 mg total) by mouth daily.   Budeson-Glycopyrrol-Formoterol (BREZTRI AEROSPHERE) 160-9-4.8 MCG/ACT AERO Inhale into the lungs. 2 puffs in the morning and 2 puffs at night   FLUoxetine (PROZAC) 40 MG capsule TAKE 1 CAPSULE BY MOUTH EVERY DAY   gabapentin (NEURONTIN) 300 MG capsule TAKE 1 CAPSULE BY MOUTH THREE TIMES A DAY   glucose blood (ONETOUCH ULTRA) test strip TEST ONCE DAILY E11.42   ipratropium-albuterol (DUONEB) 0.5-2.5 (3) MG/3ML SOLN USE 1 VIAL VIA NEBULIZER EVERY 6 HOURS  AS NEEDED   nitroGLYCERIN (NITROSTAT) 0.4 MG SL tablet Place 0.4 mg under the tongue every 5 (five) minutes as needed for chest pain.   traZODone (DESYREL) 100 MG tablet TAKE 2 TABLETS (200 MG TOTAL) BY MOUTH AT BEDTIME AS NEEDED FOR SLEEP   Facility-Administered Encounter Medications as of 03/07/2023  Medication   albuterol (PROVENTIL) (2.5 MG/3ML) 0.083% nebulizer solution 2.5 mg    Allergies (verified) Actos [pioglitazone], Avandia [rosiglitazone], Hydromorphone, and Dilaudid [hydromorphone hcl]   History: Past Medical History:  Diagnosis Date   Allergy    Anemia    Anginal pain (HCC)    Arthritis    "hands"   Asthma    Back pain    4 deteriorating disc and receives an injection q3-50mon;has been doing this for about 32yrs   Blood transfusion    as a child   Bronchitis    Cancer (HCC) dx'd 2013   Non-small cell lung cancer   Chronic kidney disease    acute kidney failure post surgery   COPD (chronic obstructive pulmonary disease) (HCC)    uses Albuterol and Spiriva daily   Coronary artery disease    has 1 stent   Depression    takes Prozac daily   Emphysema    sees Dr.Ramaswami for this   Family history of  adverse reaction to anesthesia    mother was hard to wake up sometimes   GERD (gastroesophageal reflux disease)    takes Prilosec daily, EGD nml 06/2017   Glaucoma    hx of   H/O hiatal hernia    Hypertension    takes  HYzaar daily   Insomnia    takes Trazodone nightly   Lung mass    right upper lobe   Myocardial infarct [facility])    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   Obesity    Parathyroid disease (HCC)    Periodic limb movements of sleep    Peripheral neuropathy    Pneumonia    hx of' 72 yo,rd' last time in 2013   Productive cough    white in color but no odor   Shortness of breath    with exertion    Sleep apnea    uses BiPaP; "no longer have sleep apnea since I have lost over 100 lbs"   Syncope and collapse 05/26/2013    Thyroid disease    Type II diabetes mellitus (HCC)    takes Metformin bid and Novolog and Lantus daily   Past Surgical History:  Procedure Laterality Date   CARDIAC CATHETERIZATION  2006/2008/2009   CARPAL TUNNEL RELEASE     bilateral   COLONOSCOPY     CORONARY ANGIOPLASTY WITH STENT PLACEMENT     1 stent   ELBOW SURGERY     ulnar nerve; left   ESOPHAGOGASTRODUODENOSCOPY     EYE SURGERY     pt. denies   FUDUCIAL PLACEMENT Left 08/24/2016   Procedure: PLACEMENT OF FUDUCIAL;  Surgeon: Loreli Slot, MD;  Location: [facility] OR;  Service: Thoracic;  Laterality: Left;   INGUINAL HERNIA REPAIR  1990's   double inguinal   LOBECTOMY  03/17/2012   upper right side with resection   MICROLARYNGOSCOPY WITH CO2 LASER AND EXCISION OF VOCAL CORD LESION N/A 06/28/2017   Procedure: MICROLARYNGOSCOPY WITH BIOPSY;  Surgeon: Christia Reading, MD;  Location: [facility];  Service: ENT;  Laterality: N/A;   MICROLARYNGOSCOPY WITH CO2 LASER AND EXCISION OF VOCAL CORD LESION N/A 07/19/2017   Procedure: MICROLARYNGOSCOPY WITH CO2 LASER AND EXCISION OF VOCAL CORD LESION;  Surgeon: Christia Reading, MD;  Location: [facility] OR;  Service: ENT;  Laterality: N/A;  MICRO DIRECT LARYNGOSCOPY WITH CO2 LASER VOCAL CORD STRIPPING/POSS JET VENTURI VENTILATION   REFRACTIVE SURGERY     bilaterally   ROTATOR CUFF REPAIR     left   VIDEO BRONCHOSCOPY WITH ENDOBRONCHIAL NAVIGATION N/A 08/24/2016   Procedure: VIDEO BRONCHOSCOPY WITH ENDOBRONCHIAL NAVIGATION;  Surgeon: Loreli Slot, MD;  Location: [facility] OR;  Service: Thoracic;  Laterality: N/A;   Family History  Problem Relation Age of Onset   COPD Mother    Diabetes Mother    Hypertension Mother    Heart attack Father    Diabetes Father    Anesthesia problems Neg Hx    Hypotension Neg Hx    Malignant hyperthermia Neg Hx    Pseudochol deficiency Neg Hx    Social History   Socioeconomic History   Marital status: Married    Spouse name: mary lou   Number of children: 3    Years of education: trade    Highest education level: Not on file  Occupational History   Occupation: truck driver    Comment: retired  Tobacco Use   Smoking status: Every Day    Packs/day: 3.00  Years: 54.00    Additional pack years: 0.00    Total pack years: 162.00    Types: Cigarettes    Start date: 1963   Smokeless tobacco: Never   Tobacco comments:    1ppd as of 01/04/21  ep  Vaping Use   Vaping Use: Former  Substance and Sexual Activity   Alcohol use: No    Comment: no alcolol since 2013   Drug use: Yes    Types: Cocaine, Marijuana    Comment: quit 1990's   Sexual activity: Not Currently  Other Topics Concern   Not on file  Social History Narrative   Lives in [place] with wife   She is his NOK   Was a truck driver for 37 yr, 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   Social Determinants of Health   Financial Resource Strain: Low Risk  (04/20/2021)   Overall Financial Resource Strain (CARDIA)    Difficulty of Paying Living Expenses: Not hard at all  Food Insecurity: No Food Insecurity (04/20/2021)   Hunger Vital Sign    Worried About Running Out of Food in the Last Year: Never true    Ran Out of Food in the Last Year: Never true  Transportation Needs: No Transportation Needs (04/20/2021)   PRAPARE - Administrator, Civil Service (Medical): No    Lack of Transportation (Non-Medical): No  Physical Activity: Inactive (04/20/2021)   Exercise Vital Sign    Days of Exercise per Week: 0 days    Minutes of Exercise per Session: 0 min  Stress: No Stress Concern Present (04/20/2021)   [employer] of Occupational Health - Occupational Stress Questionnaire    Feeling of Stress : Not at all  Social Connections: Moderately Isolated (04/20/2021)   Social Connection and Isolation Panel [NHANES]    Frequency of Communication with Friends and Family: Never     Frequency of Social Gatherings with Friends and Family: More than three times a week    Attends Religious Services: Never    Database administrator or Organizations: No    Attends Banker Meetings: Never    Marital Status: Married    Tobacco Counseling Ready to quit: Not Answered Counseling given: Not Answered Tobacco comments: 1ppd as of 01/04/21  ep   Clinical Intake:                 Diabetic?Yes   Nutrition Risk Assessment:  Has the patient had any N/V/D within the last 2 months?  {YES/NO:21197} Does the patient have any non-healing wounds?  {YES/NO:21197} Has the patient had any unintentional weight loss or weight gain?  {YES/NO:21197}  Diabetes:  Is the patient diabetic?  {YES/NO:21197} If diabetic, was a CBG obtained today?  {YES/NO:21197} Did the patient bring in their glucometer from home?  {YES/NO:21197} How often do you monitor your CBG's? ***.   Financial Strains and Diabetes Management:  Are you having any financial strains with the device, your supplies or your medication? {YES/NO:21197}.  Does the patient want to be seen by Chronic Care Management for management of their diabetes?  {YES/NO:21197} Would the patient like to be referred to a Nutritionist or for Diabetic Management?  {YES/NO:21197}  Diabetic Exams:  {Diabetic Eye Exam:2101801} {Diabetic Foot Exam:2101802}          Activities of Daily Living  No data to display          Patient Care Team: Donita Brooks, MD as PCP - General (Family Medicine) Jake Bathe, MD as PCP - Cardiology (Cardiology) Donita Brooks, MD (Family Medicine) Kalman Shan, MD (Pulmonary Disease) Charlott Rakes, MD as Attending Physician (Gastroenterology) Lyn Records, MD (Inactive) as Consulting Physician (Cardiology) Margaretmary Dys, MD as Consulting Physician (Radiation Oncology) Marcello Fennel, PA-C as Physician Assistant (Urology) Barrie Folk, RN (Inactive)  as Oncology Nurse Navigator (Oncology) Anabel Bene, RD as Dietitian (Nutrition) Nonda Lou, PT (Inactive) as Physical Therapist (Physical Therapy) Barron Alvine, CCC-SLP as Speech Language Pathologist (Speech Pathology) Ernestene Mention, LCSW as Social Worker Erroll Luna, [facility] as Pharmacist (Pharmacist)  Indicate any recent Medical Services you may have received from other than [facility] providers in the past year (date may be approximate).     Assessment:   This is a routine wellness examination for Estefan.  Hearing/Vision screen No results found.  Dietary issues and exercise activities discussed:     Goals Addressed   None    Depression Screen    12/10/2022    4:19 PM 08/23/2022    8:14 AM 04/20/2021    2:12 PM 03/30/2021   12:16 PM 08/11/2019   10:06 AM 08/11/2019    8:29 AM 01/15/2018    2:24 PM  PHQ 2/9 Scores  PHQ - 2 Score 0 0 0 0 0 0 0    Fall Risk    12/10/2022    4:19 PM 08/23/2022    8:14 AM 04/20/2021    2:12 PM 03/30/2021   12:16 PM 08/11/2019    8:29 AM  Fall Risk   Falls in the past year? 1 0 1 1 1   Number falls in past yr: 1 0 0 1   Injury with Fall? 0 0 0 0   Risk for fall due to : History of fall(s);Impaired balance/gait No Fall Risks Impaired balance/gait Impaired balance/gait;History of fall(s);Impaired mobility   Follow up Education provided;Falls prevention discussed Falls prevention discussed Falls evaluation completed;Falls prevention discussed Falls evaluation completed Falls evaluation completed    FALL RISK PREVENTION PERTAINING TO THE HOME:  Any stairs in or around the home? {YES/NO:21197} If so, are there any without handrails? {YES/NO:21197} Home free of loose throw rugs in walkways, pet beds, electrical cords, etc? {YES/NO:21197} Adequate lighting in your home to reduce risk of falls? {YES/NO:21197}  ASSISTIVE DEVICES UTILIZED TO PREVENT FALLS:  Life alert? {YES/NO:21197} Use of a cane, walker or w/c?  {YES/NO:21197} Grab bars in the bathroom? {YES/NO:21197} Shower chair or bench in shower? {YES/NO:21197} Elevated toilet seat or a handicapped toilet? {YES/NO:21197}  TIMED UP AND GO:  Was the test performed? No . Telephonic visit   Cognitive Function:        Immunizations Immunization History  Administered Date(s) Administered   Fluad Quad(high Dose 65+) 08/11/2019, 08/23/2022   Influenza Split 08/21/2012, 08/25/2013   Influenza Whole 06/06/2009, 07/07/2011   Influenza, High Dose Seasonal PF 08/13/2017, 09/01/2018, 08/22/2020   Influenza,inj,Quad PF,6+ Mos 10/25/2014, 08/17/2016   Influenza-Unspecified 08/09/2010, 08/13/2017   PFIZER(Purple Top)SARS-COV-2 Vaccination 12/28/2019, 01/18/2020   Pneumococcal Conjugate-13 10/02/2013   Pneumococcal Polysaccharide-23 11/11/2008, 09/22/2013, 08/11/2019   Pneumococcal-Unspecified 03/05/2006   Tdap 09/09/2012   Zoster, Live 08/05/2012, 09/09/2012    TDAP status: Due, Education has been provided regarding the importance of this vaccine. Advised may receive this vaccine at local pharmacy or Health Dept. Aware to provide a copy  of the vaccination record if obtained from local pharmacy or Health Dept. Verbalized acceptance and understanding.  Flu Vaccine status: Up to date  Pneumococcal vaccine status: Up to date  Covid-19 vaccine status: Information provided on how to obtain vaccines.   Qualifies for Shingles Vaccine? Yes   Zostavax completed No   Shingrix Completed?: No.    Education has been provided regarding the importance of this vaccine. Patient has been advised to call insurance company to determine out of pocket expense if they have not yet received this vaccine. Advised may also receive vaccine at local pharmacy or Health Dept. Verbalized acceptance and understanding.  Screening Tests Health Maintenance  Topic Date Due   COVID-19 Vaccine (3 - Pfizer risk series) 02/15/2020   Medicare Annual Wellness (AWV)  04/20/2022    HEMOGLOBIN A1C  02/22/2023   Zoster Vaccines- Shingrix (1 of 2) 03/10/2023 (Originally 03/18/1970)   OPHTHALMOLOGY EXAM  05/10/2023 (Originally 07/08/2021)   COLONOSCOPY (Pts 45-105yrs Insurance coverage will need to be confirmed)  12/11/2023 (Originally 09/18/2021)   Hepatitis C Screening  12/11/2023 (Originally 03/18/1969)   FOOT EXAM  05/03/2023   INFLUENZA VACCINE  06/06/2023   Diabetic kidney evaluation - eGFR measurement  08/24/2023   Diabetic kidney evaluation - Urine ACR  08/24/2023   Pneumonia Vaccine 55+ Years old  Completed   HPV VACCINES  Aged Out   DTaP/Tdap/Td  Discontinued    Health Maintenance  Health Maintenance Due  Topic Date Due   COVID-19 Vaccine (3 - Pfizer risk series) 02/15/2020   Medicare Annual Wellness (AWV)  04/20/2022   HEMOGLOBIN A1C  02/22/2023    Colorectal cancer screening: Type of screening: Colonoscopy. Completed 09/19/11. Repeat every 10 years  Lung Cancer Screening: (Low Dose CT Chest recommended if Age 19-80 years, 30 pack-year currently smoking OR have quit w/in 15years.) does not qualify.   Lung Cancer Screening Referral: n/a  Additional Screening:  Hepatitis C Screening: does qualify  Vision Screening: Recommended annual ophthalmology exams for early detection of glaucoma and other disorders of the eye. Is the patient up to date with their annual eye exam?  {YES/NO:21197} Who is the provider or what is the name of the office in which the patient attends annual eye exams? *** If pt is not established with a provider, would they like to be referred to a provider to establish care? {YES/NO:21197}.   Dental Screening: Recommended annual dental exams for proper oral hygiene  Community Resource Referral / Chronic Care Management: CRR required this visit?  {YES/NO:21197}  CCM required this visit?  {YES/NO:21197}     Plan:     I have personally reviewed and noted the following in the patient's chart:   Medical and social history Use of  alcohol, tobacco or illicit drugs  Current medications and supplements including opioid prescriptions. {Opioid Prescriptions:[phone]} Functional ability and status Nutritional status Physical activity Advanced directives List of other physicians Hospitalizations, surgeries, and ER visits in previous 12 months Vitals Screenings to include cognitive, depression, and falls Referrals and appointments  In addition, I have reviewed and discussed with patient certain preventive protocols, quality metrics, and best practice recommendations. A written personalized care plan for preventive services as well as general preventive health recommendations were provided to patient.     Durwin Nora, [place]   11/10/1094   Due to this being a virtual visit, the after visit summary with patients personalized plan was offered to patient via mail or my-chart. ***Patient declined at this time./ Patient would like  to access on my-chart/ per request, patient was mailed a copy of AVS./ Patient preferred to pick up at office at next visit  Nurse Notes: ***

## 2023-03-25 ENCOUNTER — Encounter: Payer: Self-pay | Admitting: Family Medicine

## 2023-04-15 ENCOUNTER — Telehealth: Payer: Self-pay

## 2023-04-15 MED ORDER — DILTIAZEM HCL ER COATED BEADS 120 MG PO CP24: 120.0000 mg | ORAL_CAPSULE | Freq: Every day | ORAL | 1 refills | Status: AC

## 2023-04-15 NOTE — Telephone Encounter (Signed)
Pt's wife calling requesting a refill on medication diltiazem 120 mg capsule. This medication was D/C off of pt's medication list at last office visit on 12/31/22, because stated that he was not taking it. Pt's wife states that pt is still taking this medication daily. Would Dr. Anne Fu like to reorder this medication? Please address

## 2023-04-15 NOTE — Telephone Encounter (Signed)
Pts wife said it was an error and he gets confused when asked about his meds... he has been on the Diltiazem 120 for years and his BP has been good while on it 128/70, 126/74.. she will continue to monitor.

## 2023-05-12 ENCOUNTER — Inpatient Hospital Stay (HOSPITAL_COMMUNITY)
Admission: EM | Admit: 2023-05-12 | Discharge: 2023-05-14 | DRG: 871 | Discharge status: Against Medical Advice | Payer: PPO | Attending: Internal Medicine | Admitting: Internal Medicine

## 2023-05-12 ENCOUNTER — Encounter (HOSPITAL_COMMUNITY): Payer: Self-pay

## 2023-05-12 ENCOUNTER — Emergency Department (HOSPITAL_COMMUNITY): Payer: PPO

## 2023-05-12 DIAGNOSIS — Z85118 Personal history of other malignant neoplasm of bronchus and lung: Secondary | ICD-10-CM

## 2023-05-12 DIAGNOSIS — J189 Pneumonia, unspecified organism: Secondary | ICD-10-CM | POA: Diagnosis present

## 2023-05-12 DIAGNOSIS — I2510 Atherosclerotic heart disease of native coronary artery without angina pectoris: Secondary | ICD-10-CM | POA: Diagnosis present

## 2023-05-12 DIAGNOSIS — Z79899 Other long term (current) drug therapy: Secondary | ICD-10-CM

## 2023-05-12 DIAGNOSIS — K4030 Unilateral inguinal hernia, with obstruction, without gangrene, not specified as recurrent: Secondary | ICD-10-CM | POA: Diagnosis present

## 2023-05-12 DIAGNOSIS — C3412 Malignant neoplasm of upper lobe, left bronchus or lung: Secondary | ICD-10-CM | POA: Diagnosis present

## 2023-05-12 DIAGNOSIS — Z902 Acquired absence of lung [part of]: Secondary | ICD-10-CM

## 2023-05-12 DIAGNOSIS — R918 Other nonspecific abnormal finding of lung field: Secondary | ICD-10-CM | POA: Diagnosis not present

## 2023-05-12 DIAGNOSIS — I252 Old myocardial infarction: Secondary | ICD-10-CM

## 2023-05-12 DIAGNOSIS — Z833 Family history of diabetes mellitus: Secondary | ICD-10-CM

## 2023-05-12 DIAGNOSIS — J440 Chronic obstructive pulmonary disease with acute lower respiratory infection: Secondary | ICD-10-CM | POA: Diagnosis present

## 2023-05-12 DIAGNOSIS — Z66 Do not resuscitate: Secondary | ICD-10-CM | POA: Diagnosis present

## 2023-05-12 DIAGNOSIS — R0602 Shortness of breath: Secondary | ICD-10-CM | POA: Diagnosis not present

## 2023-05-12 DIAGNOSIS — Z1152 Encounter for screening for COVID-19: Secondary | ICD-10-CM

## 2023-05-12 DIAGNOSIS — J441 Chronic obstructive pulmonary disease with (acute) exacerbation: Secondary | ICD-10-CM | POA: Diagnosis present

## 2023-05-12 DIAGNOSIS — H409 Unspecified glaucoma: Secondary | ICD-10-CM | POA: Diagnosis present

## 2023-05-12 DIAGNOSIS — G4700 Insomnia, unspecified: Secondary | ICD-10-CM | POA: Diagnosis present

## 2023-05-12 DIAGNOSIS — R1111 Vomiting without nausea: Secondary | ICD-10-CM | POA: Diagnosis not present

## 2023-05-12 DIAGNOSIS — E119 Type 2 diabetes mellitus without complications: Secondary | ICD-10-CM

## 2023-05-12 DIAGNOSIS — G4733 Obstructive sleep apnea (adult) (pediatric): Secondary | ICD-10-CM | POA: Diagnosis present

## 2023-05-12 DIAGNOSIS — A419 Sepsis, unspecified organism: Secondary | ICD-10-CM | POA: Diagnosis not present

## 2023-05-12 DIAGNOSIS — R1084 Generalized abdominal pain: Secondary | ICD-10-CM | POA: Diagnosis not present

## 2023-05-12 DIAGNOSIS — Z955 Presence of coronary angioplasty implant and graft: Secondary | ICD-10-CM

## 2023-05-12 DIAGNOSIS — N1832 Chronic kidney disease, stage 3b: Secondary | ICD-10-CM

## 2023-05-12 DIAGNOSIS — K56609 Unspecified intestinal obstruction, unspecified as to partial versus complete obstruction: Secondary | ICD-10-CM | POA: Diagnosis present

## 2023-05-12 DIAGNOSIS — K219 Gastro-esophageal reflux disease without esophagitis: Secondary | ICD-10-CM | POA: Diagnosis present

## 2023-05-12 DIAGNOSIS — Z825 Family history of asthma and other chronic lower respiratory diseases: Secondary | ICD-10-CM

## 2023-05-12 DIAGNOSIS — R0902 Hypoxemia: Secondary | ICD-10-CM | POA: Diagnosis not present

## 2023-05-12 DIAGNOSIS — Z888 Allergy status to other drugs, medicaments and biological substances status: Secondary | ICD-10-CM

## 2023-05-12 DIAGNOSIS — Z8249 Family history of ischemic heart disease and other diseases of the circulatory system: Secondary | ICD-10-CM

## 2023-05-12 DIAGNOSIS — F32A Depression, unspecified: Secondary | ICD-10-CM | POA: Diagnosis present

## 2023-05-12 DIAGNOSIS — I129 Hypertensive chronic kidney disease with stage 1 through stage 4 chronic kidney disease, or unspecified chronic kidney disease: Secondary | ICD-10-CM | POA: Diagnosis present

## 2023-05-12 DIAGNOSIS — J439 Emphysema, unspecified: Secondary | ICD-10-CM | POA: Diagnosis present

## 2023-05-12 DIAGNOSIS — F17210 Nicotine dependence, cigarettes, uncomplicated: Secondary | ICD-10-CM | POA: Diagnosis present

## 2023-05-12 DIAGNOSIS — J9811 Atelectasis: Secondary | ICD-10-CM | POA: Diagnosis not present

## 2023-05-12 DIAGNOSIS — C3432 Malignant neoplasm of lower lobe, left bronchus or lung: Secondary | ICD-10-CM | POA: Diagnosis present

## 2023-05-12 DIAGNOSIS — I10 Essential (primary) hypertension: Secondary | ICD-10-CM | POA: Diagnosis not present

## 2023-05-12 DIAGNOSIS — E1122 Type 2 diabetes mellitus with diabetic chronic kidney disease: Secondary | ICD-10-CM | POA: Diagnosis present

## 2023-05-12 DIAGNOSIS — K4020 Bilateral inguinal hernia, without obstruction or gangrene, not specified as recurrent: Secondary | ICD-10-CM | POA: Diagnosis present

## 2023-05-12 DIAGNOSIS — R110 Nausea: Secondary | ICD-10-CM | POA: Diagnosis not present

## 2023-05-12 LAB — CBC WITH DIFFERENTIAL/PLATELET
Abs Immature Granulocytes: 0.1 10*3/uL — ABNORMAL HIGH (ref 0.00–0.07)
Basophils Absolute: 0 10*3/uL (ref 0.0–0.1)
Basophils Relative: 0 %
Eosinophils Absolute: 0.1 10*3/uL (ref 0.0–0.5)
Eosinophils Relative: 0 %
HCT: 35.3 % — ABNORMAL LOW (ref 39.0–52.0)
Hemoglobin: 11.8 g/dL — ABNORMAL LOW (ref 13.0–17.0)
Immature Granulocytes: 1 %
Lymphocytes Relative: 18 %
Lymphs Abs: 3.1 10*3/uL (ref 0.7–4.0)
MCH: 30.1 pg (ref 26.0–34.0)
MCHC: 33.4 g/dL (ref 30.0–36.0)
MCV: 90.1 fL (ref 80.0–100.0)
Monocytes Absolute: 1.2 10*3/uL — ABNORMAL HIGH (ref 0.1–1.0)
Monocytes Relative: 7 %
Neutro Abs: 12.8 10*3/uL — ABNORMAL HIGH (ref 1.7–7.7)
Neutrophils Relative %: 74 %
Platelets: 343 10*3/uL (ref 150–400)
RBC: 3.92 MIL/uL — ABNORMAL LOW (ref 4.22–5.81)
RDW: 14.4 % (ref 11.5–15.5)
WBC: 17.3 10*3/uL — ABNORMAL HIGH (ref 4.0–10.5)
nRBC: 0 % (ref 0.0–0.2)

## 2023-05-12 NOTE — ED Provider Notes (Signed)
EMERGENCY DEPARTMENT AT [facility] Provider Note   CSN: [id number] Arrival date & time: 05/12/23  2327     History {Add pertinent medical, surgical, social history, OB history to HPI:1} Chief Complaint  Patient presents with   Abdominal Pain    [name] is a 72 y.o. male.  The history is provided by the patient and medical records.  Abdominal Pain  72 y.o. M with hx of CAD, CKD, depression, GERD, HTN, DM2, history of lung cancer, presenting to the ED for abdominal pain.  States pain along mid-abdomen near the navel.  He has had some nausea/vomiting with EMS but seems more clear.  No diarrhea.  He states he has lost approx 35 lbs in the past month unintentionally.  States has been in remission from lung cancer for about 3 years now, currently under surveillance.    Home Medications Prior to Admission medications   Medication Sig Start Date End Date Taking? Authorizing Provider  acetaminophen (TYLENOL) 500 MG tablet Take 1,000 mg by mouth every 6 (six) hours as needed for moderate pain.    [provider]  albuterol (VENTOLIN HFA) 108 (90 Base) MCG/ACT inhaler Inhale 2 puffs into the lungs every 4 (four) hours as needed for shortness of breath or wheezing. 06/20/21   [provider]  atorvastatin (LIPITOR) 20 MG tablet Take 1 tablet (20 mg total) by mouth daily. 01/28/23   Jake Bathe, MD  Budeson-Glycopyrrol-Formoterol (BREZTRI AEROSPHERE) 160-9-4.8 MCG/ACT AERO Inhale into the lungs. 2 puffs in the morning and 2 puffs at night 02/13/22   [provider]  diltiazem (CARDIZEM CD) 120 MG 24 hr capsule Take 1 capsule (120 mg total) by mouth daily. 04/15/23 07/14/23  Jake Bathe, MD  FLUoxetine (PROZAC) 40 MG capsule TAKE 1 CAPSULE BY MOUTH EVERY DAY 10/25/22   Donita Brooks, MD  gabapentin (NEURONTIN) 300 MG capsule TAKE 1 CAPSULE BY MOUTH THREE TIMES A DAY 08/31/22   Donita Brooks, MD  glucose blood (ONETOUCH ULTRA) test  strip TEST ONCE DAILY E11.42 11/17/19   [provider]  ipratropium-albuterol (DUONEB) 0.5-2.5 (3) MG/3ML SOLN USE 1 VIAL VIA NEBULIZER EVERY 6 HOURS AS NEEDED 09/18/22   Kalman Shan, MD  nitroGLYCERIN (NITROSTAT) 0.4 MG SL tablet Place 0.4 mg under the tongue every 5 (five) minutes as needed for chest pain.    [provider]  traZODone (DESYREL) 100 MG tablet TAKE 2 TABLETS (200 MG TOTAL) BY MOUTH AT BEDTIME AS NEEDED FOR SLEEP 12/25/22   Donita Brooks, MD      Allergies    Actos [pioglitazone], Avandia [rosiglitazone], Hydromorphone, and Dilaudid [hydromorphone hcl]    Review of Systems   Review of Systems  Gastrointestinal:  Positive for abdominal pain.    Physical Exam Updated Vital Signs There were no vitals taken for this visit. Physical Exam  ED Results / Procedures / Treatments   Labs (all labs ordered are listed, but only abnormal results are displayed) Labs Reviewed  CBC WITH DIFFERENTIAL/PLATELET  COMPREHENSIVE METABOLIC PANEL  LIPASE, BLOOD  URINALYSIS, ROUTINE W REFLEX MICROSCOPIC  TROPONIN I (HIGH SENSITIVITY)    EKG None  Radiology No results found.  Procedures Procedures  {Document cardiac monitor, telemetry assessment procedure when appropriate:1}  Medications Ordered in ED Medications - No data to display  ED Course/ Medical Decision Making/ A&P   {   Click here for ABCD2, HEART and other calculatorsREFRESH Note before signing :1}  Medical Decision Making Amount and/or Complexity of Data Reviewed Labs: ordered. Radiology: ordered.   ***  {Document critical care time when appropriate:1} {Document review of labs and clinical decision tools ie heart score, Chads2Vasc2 etc:1}  {Document your independent review of radiology images, and any outside records:1} {Document your discussion with family members, caretakers, and with consultants:1} {Document social determinants of health affecting pt's  care:1} {Document your decision making why or why not admission, treatments were needed:1} Final Clinical Impression(s) / ED Diagnoses Final diagnoses:  None    Rx / DC Orders ED Discharge Orders     None

## 2023-05-12 NOTE — ED Triage Notes (Signed)
Medic was called out for abd pain, shortness of breath when medic arrived pt was place don O2 and the SOB improved but still was having abd pain that lead to multiple episodes of vomiting that medic gave 4mg  of zofran for that has alleviated some of the symptoms of nausea dn vomiting. Pain is regional on the abd. Also reports weight loss in the recent months with having a normal appetite.   Medic Vitals   108/60  20g IV left forearm

## 2023-05-13 ENCOUNTER — Inpatient Hospital Stay (HOSPITAL_COMMUNITY): Payer: PPO

## 2023-05-13 ENCOUNTER — Encounter (HOSPITAL_COMMUNITY): Payer: Self-pay | Admitting: Internal Medicine

## 2023-05-13 ENCOUNTER — Emergency Department (HOSPITAL_COMMUNITY): Payer: PPO

## 2023-05-13 ENCOUNTER — Other Ambulatory Visit: Payer: Self-pay

## 2023-05-13 DIAGNOSIS — R6520 Severe sepsis without septic shock: Secondary | ICD-10-CM

## 2023-05-13 DIAGNOSIS — I2510 Atherosclerotic heart disease of native coronary artery without angina pectoris: Secondary | ICD-10-CM | POA: Diagnosis not present

## 2023-05-13 DIAGNOSIS — A419 Sepsis, unspecified organism: Secondary | ICD-10-CM

## 2023-05-13 DIAGNOSIS — E1122 Type 2 diabetes mellitus with diabetic chronic kidney disease: Secondary | ICD-10-CM | POA: Diagnosis not present

## 2023-05-13 DIAGNOSIS — N1832 Chronic kidney disease, stage 3b: Secondary | ICD-10-CM

## 2023-05-13 DIAGNOSIS — Z66 Do not resuscitate: Secondary | ICD-10-CM | POA: Diagnosis not present

## 2023-05-13 DIAGNOSIS — Z79899 Other long term (current) drug therapy: Secondary | ICD-10-CM | POA: Diagnosis not present

## 2023-05-13 DIAGNOSIS — J479 Bronchiectasis, uncomplicated: Secondary | ICD-10-CM | POA: Diagnosis not present

## 2023-05-13 DIAGNOSIS — J9811 Atelectasis: Secondary | ICD-10-CM | POA: Diagnosis not present

## 2023-05-13 DIAGNOSIS — F32A Depression, unspecified: Secondary | ICD-10-CM | POA: Diagnosis not present

## 2023-05-13 DIAGNOSIS — R112 Nausea with vomiting, unspecified: Secondary | ICD-10-CM | POA: Diagnosis not present

## 2023-05-13 DIAGNOSIS — I129 Hypertensive chronic kidney disease with stage 1 through stage 4 chronic kidney disease, or unspecified chronic kidney disease: Secondary | ICD-10-CM | POA: Diagnosis not present

## 2023-05-13 DIAGNOSIS — Z888 Allergy status to other drugs, medicaments and biological substances status: Secondary | ICD-10-CM | POA: Diagnosis not present

## 2023-05-13 DIAGNOSIS — Z8249 Family history of ischemic heart disease and other diseases of the circulatory system: Secondary | ICD-10-CM | POA: Diagnosis not present

## 2023-05-13 DIAGNOSIS — J189 Pneumonia, unspecified organism: Secondary | ICD-10-CM | POA: Diagnosis not present

## 2023-05-13 DIAGNOSIS — J439 Emphysema, unspecified: Secondary | ICD-10-CM | POA: Diagnosis not present

## 2023-05-13 DIAGNOSIS — J441 Chronic obstructive pulmonary disease with (acute) exacerbation: Secondary | ICD-10-CM | POA: Diagnosis not present

## 2023-05-13 DIAGNOSIS — Z902 Acquired absence of lung [part of]: Secondary | ICD-10-CM | POA: Diagnosis not present

## 2023-05-13 DIAGNOSIS — Z825 Family history of asthma and other chronic lower respiratory diseases: Secondary | ICD-10-CM | POA: Diagnosis not present

## 2023-05-13 DIAGNOSIS — Z1152 Encounter for screening for COVID-19: Secondary | ICD-10-CM | POA: Diagnosis not present

## 2023-05-13 DIAGNOSIS — N281 Cyst of kidney, acquired: Secondary | ICD-10-CM | POA: Diagnosis not present

## 2023-05-13 DIAGNOSIS — K4030 Unilateral inguinal hernia, with obstruction, without gangrene, not specified as recurrent: Secondary | ICD-10-CM | POA: Diagnosis not present

## 2023-05-13 DIAGNOSIS — I252 Old myocardial infarction: Secondary | ICD-10-CM | POA: Diagnosis not present

## 2023-05-13 DIAGNOSIS — Z833 Family history of diabetes mellitus: Secondary | ICD-10-CM | POA: Diagnosis not present

## 2023-05-13 DIAGNOSIS — R1084 Generalized abdominal pain: Secondary | ICD-10-CM | POA: Diagnosis not present

## 2023-05-13 DIAGNOSIS — F17210 Nicotine dependence, cigarettes, uncomplicated: Secondary | ICD-10-CM | POA: Diagnosis not present

## 2023-05-13 DIAGNOSIS — Z955 Presence of coronary angioplasty implant and graft: Secondary | ICD-10-CM | POA: Diagnosis not present

## 2023-05-13 DIAGNOSIS — K56609 Unspecified intestinal obstruction, unspecified as to partial versus complete obstruction: Secondary | ICD-10-CM | POA: Diagnosis not present

## 2023-05-13 DIAGNOSIS — K5669 Other partial intestinal obstruction: Secondary | ICD-10-CM | POA: Diagnosis not present

## 2023-05-13 DIAGNOSIS — Z85118 Personal history of other malignant neoplasm of bronchus and lung: Secondary | ICD-10-CM | POA: Diagnosis not present

## 2023-05-13 DIAGNOSIS — J440 Chronic obstructive pulmonary disease with acute lower respiratory infection: Secondary | ICD-10-CM | POA: Diagnosis not present

## 2023-05-13 LAB — COMPREHENSIVE METABOLIC PANEL
ALT: 12 U/L (ref 0–44)
ALT: 15 U/L (ref 0–44)
AST: 15 U/L (ref 15–41)
AST: 19 U/L (ref 15–41)
Albumin: 2.5 g/dL — ABNORMAL LOW (ref 3.5–5.0)
Albumin: 2.8 g/dL — ABNORMAL LOW (ref 3.5–5.0)
Alkaline Phosphatase: 79 U/L (ref 38–126)
Alkaline Phosphatase: 79 U/L (ref 38–126)
Anion gap: 12 (ref 5–15)
Anion gap: 12 (ref 5–15)
BUN: 26 mg/dL — ABNORMAL HIGH (ref 8–23)
BUN: 26 mg/dL — ABNORMAL HIGH (ref 8–23)
CO2: 23 mmol/L (ref 22–32)
CO2: 23 mmol/L (ref 22–32)
Calcium: 8.1 mg/dL — ABNORMAL LOW (ref 8.9–10.3)
Calcium: 8.6 mg/dL — ABNORMAL LOW (ref 8.9–10.3)
Chloride: 100 mmol/L (ref 98–111)
Chloride: 99 mmol/L (ref 98–111)
Creatinine, Ser: 1.18 mg/dL (ref 0.61–1.24)
Creatinine, Ser: 1.27 mg/dL — ABNORMAL HIGH (ref 0.61–1.24)
GFR, Estimated: 60 mL/min — ABNORMAL LOW (ref >60)
GFR, Estimated: >60 mL/min (ref >60)
Glucose, Bld: 152 mg/dL — ABNORMAL HIGH (ref 70–99)
Glucose, Bld: 161 mg/dL — ABNORMAL HIGH (ref 70–99)
Potassium: 4 mmol/L (ref 3.5–5.1)
Potassium: 4.6 mmol/L (ref 3.5–5.1)
Sodium: 134 mmol/L — ABNORMAL LOW (ref 135–145)
Sodium: 135 mmol/L (ref 135–145)
Total Bilirubin: 0.6 mg/dL (ref 0.3–1.2)
Total Bilirubin: 0.9 mg/dL (ref 0.3–1.2)
Total Protein: 6 g/dL — ABNORMAL LOW (ref 6.5–8.1)
Total Protein: 6.4 g/dL — ABNORMAL LOW (ref 6.5–8.1)

## 2023-05-13 LAB — CBC
HCT: 34.2 % — ABNORMAL LOW (ref 39.0–52.0)
Hemoglobin: 11.2 g/dL — ABNORMAL LOW (ref 13.0–17.0)
MCH: 29.9 pg (ref 26.0–34.0)
MCHC: 32.7 g/dL (ref 30.0–36.0)
MCV: 91.4 fL (ref 80.0–100.0)
Platelets: 353 10*3/uL (ref 150–400)
RBC: 3.74 MIL/uL — ABNORMAL LOW (ref 4.22–5.81)
RDW: 14.6 % (ref 11.5–15.5)
WBC: 17.1 10*3/uL — ABNORMAL HIGH (ref 4.0–10.5)
nRBC: 0 % (ref 0.0–0.2)

## 2023-05-13 LAB — URINALYSIS, ROUTINE W REFLEX MICROSCOPIC
Bilirubin Urine: NEGATIVE
Glucose, UA: NEGATIVE mg/dL
Hgb urine dipstick: NEGATIVE
Ketones, ur: NEGATIVE mg/dL
Leukocytes,Ua: NEGATIVE
Nitrite: NEGATIVE
Protein, ur: 30 mg/dL — AB
Specific Gravity, Urine: >1.046 — ABNORMAL HIGH (ref 1.005–1.030)
pH: 5 (ref 5.0–8.0)

## 2023-05-13 LAB — RESPIRATORY PANEL BY PCR

## 2023-05-13 LAB — HIV ANTIBODY (ROUTINE TESTING W REFLEX): HIV Screen 4th Generation wRfx: NONREACTIVE

## 2023-05-13 LAB — CULTURE, RESPIRATORY W GRAM STAIN

## 2023-05-13 LAB — EXPECTORATED SPUTUM ASSESSMENT W GRAM STAIN, RFLX TO RESP C: Special Requests: NORMAL

## 2023-05-13 LAB — PROTIME-INR
INR: 1 (ref 0.8–1.2)
Prothrombin Time: 13.5 seconds (ref 11.4–15.2)

## 2023-05-13 LAB — LACTIC ACID, PLASMA
Lactic Acid, Venous: 0.6 mmol/L (ref 0.5–1.9)
Lactic Acid, Venous: 0.8 mmol/L (ref 0.5–1.9)

## 2023-05-13 LAB — TROPONIN I (HIGH SENSITIVITY)
Troponin I (High Sensitivity): 6 ng/L (ref <18)
Troponin I (High Sensitivity): 7 ng/L (ref <18)

## 2023-05-13 LAB — STREP PNEUMONIAE URINARY ANTIGEN: Strep Pneumo Urinary Antigen: NEGATIVE

## 2023-05-13 LAB — APTT: aPTT: 33 seconds (ref 24–36)

## 2023-05-13 LAB — LIPASE, BLOOD: Lipase: 27 U/L (ref 11–51)

## 2023-05-13 MED ORDER — IOHEXOL 350 MG/ML SOLN
75.0000 mL | Freq: Once | INTRAVENOUS | Status: AC | PRN
  Administered 2023-05-13: 75 mL via INTRAVENOUS

## 2023-05-13 MED ORDER — SODIUM CHLORIDE 0.9 % IV SOLN: 500.0000 mg | Freq: Every day | INTRAVENOUS | Status: DC

## 2023-05-13 MED ORDER — IPRATROPIUM-ALBUTEROL 0.5-2.5 (3) MG/3ML IN SOLN
3.0000 mL | Freq: Four times a day (QID) | RESPIRATORY_TRACT | Status: DC
  Administered 2023-05-13 (×2): 3 mL via RESPIRATORY_TRACT
  Filled 2023-05-13 (×2): qty 3

## 2023-05-13 MED ORDER — SODIUM CHLORIDE 0.9 % IV SOLN
500.0000 mg | Freq: Once | INTRAVENOUS | Status: DC
  Administered 2023-05-13: 500 mg via INTRAVENOUS
  Filled 2023-05-13: qty 5

## 2023-05-13 MED ORDER — SODIUM CHLORIDE 0.9 % IV SOLN
500.0000 mg | Freq: Every day | INTRAVENOUS | Status: DC
  Filled 2023-05-13: qty 5

## 2023-05-13 MED ORDER — METHYLPREDNISOLONE SODIUM SUCC 125 MG IJ SOLR
125.0000 mg | Freq: Every day | INTRAMUSCULAR | Status: DC
  Administered 2023-05-13: 125 mg via INTRAVENOUS
  Filled 2023-05-13: qty 2

## 2023-05-13 MED ORDER — LACTATED RINGERS IV SOLN: 150.0000 mL/h | INTRAVENOUS | Status: DC

## 2023-05-13 MED ORDER — FENTANYL CITRATE PF 50 MCG/ML IJ SOSY
50.0000 ug | PREFILLED_SYRINGE | Freq: Once | INTRAMUSCULAR | Status: AC
  Administered 2023-05-13: 50 ug via INTRAVENOUS
  Filled 2023-05-13: qty 1

## 2023-05-13 MED ORDER — LIDOCAINE HCL URETHRAL/MUCOSAL 2 % EX GEL: 1.0000 | Freq: Once | CUTANEOUS | Status: DC

## 2023-05-13 MED ORDER — ONDANSETRON HCL 4 MG/2ML IJ SOLN
4.0000 mg | Freq: Once | INTRAMUSCULAR | Status: AC
  Administered 2023-05-13: 4 mg via INTRAVENOUS
  Filled 2023-05-13: qty 2

## 2023-05-13 MED ORDER — DIATRIZOATE MEGLUMINE & SODIUM 66-10 % PO SOLN
90.0000 mL | Freq: Once | ORAL | Status: DC
  Filled 2023-05-13: qty 90

## 2023-05-13 MED ORDER — ACETAMINOPHEN 325 MG PO TABS: 650.0000 mg | ORAL_TABLET | Freq: Four times a day (QID) | ORAL | Status: DC | PRN

## 2023-05-13 MED ORDER — GUAIFENESIN ER 600 MG PO TB12
600.0000 mg | ORAL_TABLET | Freq: Two times a day (BID) | ORAL | Status: DC
  Administered 2023-05-13: 600 mg via ORAL
  Filled 2023-05-13 (×2): qty 1

## 2023-05-13 MED ORDER — SODIUM CHLORIDE 0.9 % IV SOLN: 2.0000 g | INTRAVENOUS | Status: DC

## 2023-05-13 MED ORDER — LACTATED RINGERS IV BOLUS
1000.0000 mL | Freq: Once | INTRAVENOUS | Status: AC
  Administered 2023-05-13: 1000 mL via INTRAVENOUS

## 2023-05-13 MED ORDER — LACTATED RINGERS IV SOLN: INTRAVENOUS | Status: DC

## 2023-05-13 MED ORDER — LORAZEPAM 2 MG/ML IJ SOLN
2.0000 mg | Freq: Once | INTRAMUSCULAR | Status: AC
  Administered 2023-05-13: 2 mg via INTRAVENOUS
  Filled 2023-05-13: qty 1

## 2023-05-13 MED ORDER — ALBUTEROL SULFATE (2.5 MG/3ML) 0.083% IN NEBU: 2.5000 mg | INHALATION_SOLUTION | RESPIRATORY_TRACT | Status: DC | PRN

## 2023-05-13 MED ORDER — SODIUM CHLORIDE 0.9 % IV SOLN
1.0000 g | Freq: Once | INTRAVENOUS | Status: AC
  Administered 2023-05-13: 1 g via INTRAVENOUS
  Filled 2023-05-13: qty 10

## 2023-05-13 NOTE — Consult Note (Signed)
Consulting Physician: Hyman Hopes Mumtaz Lovins  Referring Provider: Dr. Janalyn Shy  Chief Complaint: Abdominal pain, nausea, vomitign  Reason for Consult: Bowel obstruction   Subjective   HPI: [name] is an 72 y.o. male who is here for abdominal pain, nausea, and vomiting.  He is lost 35 pounds last month unintentionally.  He has been in remission from lung cancer for 3 years now, currently on surveillance.  Past Medical History:  Diagnosis Date   Allergy    Anemia    Anginal pain (HCC)    Arthritis    "hands"   Asthma    Back pain    4 deteriorating disc and receives an injection q3-74mon;has been doing this for about 63yrs   Blood transfusion    as a child   Bronchitis    Cancer (HCC) dx'd 2013   Non-small cell lung cancer   Chronic kidney disease    acute kidney failure post surgery   COPD (chronic obstructive pulmonary disease) (HCC)    uses Albuterol and Spiriva daily   Coronary artery disease    has 1 stent   Depression    takes Prozac daily   Emphysema    sees Dr.Ramaswami for this   Family history of adverse reaction to anesthesia    mother was hard to wake up sometimes   GERD (gastroesophageal reflux disease)    takes Prilosec daily, EGD nml 06/2017   Glaucoma    hx of   H/O hiatal hernia    Hypertension    takes  HYzaar daily   Insomnia    takes Trazodone nightly   Lung mass    right upper lobe   Myocardial infarct [facility])    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   Obesity    Parathyroid disease (HCC)    Periodic limb movements of sleep    Peripheral neuropathy    Pneumonia    hx of' 72 yo,rd' last time in 2013   Productive cough    white in color but no odor   Shortness of breath    with exertion    Sleep apnea    uses BiPaP; "no longer have sleep apnea since I have lost over 100 lbs"   Syncope and collapse 05/26/2013   Thyroid disease    Type II diabetes mellitus (HCC)    takes Metformin bid and Novolog and  Lantus daily    Past Surgical History:  Procedure Laterality Date   CARDIAC CATHETERIZATION  2006/2008/2009   CARPAL TUNNEL RELEASE     bilateral   COLONOSCOPY     CORONARY ANGIOPLASTY WITH STENT PLACEMENT     1 stent   ELBOW SURGERY     ulnar nerve; left   ESOPHAGOGASTRODUODENOSCOPY     EYE SURGERY     pt. denies   FUDUCIAL PLACEMENT Left 08/24/2016   Procedure: PLACEMENT OF FUDUCIAL;  Surgeon: Loreli Slot, MD;  Location: [facility] OR;  Service: Thoracic;  Laterality: Left;   INGUINAL HERNIA REPAIR  1990's   double inguinal   LOBECTOMY  03/17/2012   upper right side with resection   MICROLARYNGOSCOPY WITH CO2 LASER AND EXCISION OF VOCAL CORD LESION N/A 06/28/2017   Procedure: MICROLARYNGOSCOPY WITH BIOPSY;  Surgeon: Christia Reading, MD;  Location: [facility] OR;  Service: ENT;  Laterality: N/A;   MICROLARYNGOSCOPY WITH CO2 LASER AND EXCISION OF VOCAL CORD LESION N/A 07/19/2017   Procedure: MICROLARYNGOSCOPY WITH CO2 LASER AND EXCISION OF VOCAL  CORD LESION;  Surgeon: Christia Reading, MD;  Location: [facility] OR;  Service: ENT;  Laterality: N/A;  MICRO DIRECT LARYNGOSCOPY WITH CO2 LASER VOCAL CORD STRIPPING/POSS JET VENTURI VENTILATION   REFRACTIVE SURGERY     bilaterally   ROTATOR CUFF REPAIR     left   VIDEO BRONCHOSCOPY WITH ENDOBRONCHIAL NAVIGATION N/A 08/24/2016   Procedure: VIDEO BRONCHOSCOPY WITH ENDOBRONCHIAL NAVIGATION;  Surgeon: Loreli Slot, MD;  Location: [facility] OR;  Service: Thoracic;  Laterality: N/A;    Family History  Problem Relation Age of Onset   COPD Mother    Diabetes Mother    Hypertension Mother    Heart attack Father    Diabetes Father    Anesthesia problems Neg Hx    Hypotension Neg Hx    Malignant hyperthermia Neg Hx    Pseudochol deficiency Neg Hx     Social:  reports that he has been smoking cigarettes. He started smoking about 61 years ago. He has a 162.00 pack-year smoking history. He has never used smokeless tobacco. He reports current drug use. Drugs:  Cocaine and Marijuana. He reports that he does not drink alcohol.  Allergies:  Allergies  Allergen Reactions   Actos [Pioglitazone] Swelling    EDEMA REACTION UNSPECIFIED   Avandia [Rosiglitazone] Swelling    SWELLING REACTION UNSPECIFIED    Dilaudid [Hydromorphone Hcl] Other (See Comments)    Made him crazy Hallucinations     Medications: Current Outpatient Medications  Medication Instructions   acetaminophen (TYLENOL) 1,000 mg, Oral, Every 6 hours PRN   atorvastatin (LIPITOR) 20 mg, Oral, Daily   Budeson-Glycopyrrol-Formoterol (BREZTRI AEROSPHERE) 160-9-4.8 MCG/ACT AERO Inhalation, 2 puffs in the morning and 2 puffs at night   diltiazem (CARDIZEM CD) 120 mg, Oral, Daily   famotidine (PEPCID) 10 mg, Oral, Daily PRN   FLUoxetine (PROZAC) 40 MG capsule TAKE 1 CAPSULE BY MOUTH EVERY DAY   gabapentin (NEURONTIN) 300 MG capsule TAKE 1 CAPSULE BY MOUTH THREE TIMES A DAY   glucose blood (ONETOUCH ULTRA) test strip TEST ONCE DAILY E11.42   ipratropium-albuterol (DUONEB) 0.5-2.5 (3) MG/3ML SOLN USE 1 VIAL VIA NEBULIZER EVERY 6 HOURS AS NEEDED   nitroGLYCERIN (NITROSTAT) 0.4 mg, Sublingual, Every 5 min PRN   traZODone (DESYREL) 200 mg, Oral, At bedtime PRN    ROS - all of the below systems have been reviewed with the patient and positives are indicated with bold text General: chills, fever or night sweats Eyes: blurry vision or double vision ENT: epistaxis or sore throat Allergy/Immunology: itchy/watery eyes or nasal congestion Hematologic/Lymphatic: bleeding problems, blood clots or swollen lymph nodes Endocrine: temperature intolerance or unexpected weight changes Breast: new or changing breast lumps or nipple discharge Resp: cough, shortness of breath, or wheezing CV: chest pain or dyspnea on exertion GI: as per HPI GU: dysuria, trouble voiding, or hematuria MSK: joint pain or joint stiffness Neuro: TIA or stroke symptoms Derm: pruritus and skin lesion changes Psych: anxiety  and depression  Objective   PE Blood pressure 124/72, pulse 91, temperature (!) 97.3 F (36.3 C), temperature source Oral, resp. rate 14, height 5\' 11"  (1.803 m), weight 56.9 kg, SpO2 95 %. Constitutional: NAD; conversant; no deformities Eyes: Moist conjunctiva; no lid lag; anicteric; PERRL Neck: Trachea midline; no thyromegaly Lungs: Normal respiratory effort; no tactile fremitus CV: RRR; no palpable thrills; no pitting edema GI: Abd incarcerated right inguinal hernia, able to be reduced with moderate amount of manipulation.; no palpable hepatosplenomegaly MSK: Normal range of motion of extremities; no clubbing/cyanosis Psychiatric:  Appropriate affect; alert and oriented x3 Lymphatic: No palpable cervical or axillary lymphadenopathy  Results for orders placed or performed during the hospital encounter of 05/12/23 (from the past 24 hour(s))  CBC with Differential     Status: Abnormal   Collection Time: 05/12/23 11:41 PM  Result Value Ref Range   WBC 17.3 (H) 4.0 - 10.5 K/uL   RBC 3.92 (L) 4.22 - 5.81 MIL/uL   Hemoglobin 11.8 (L) 13.0 - 17.0 g/dL   HCT 16.1 (L) 09.6 - 04.5 %   MCV 90.1 80.0 - 100.0 fL   MCH 30.1 26.0 - 34.0 pg   MCHC 33.4 30.0 - 36.0 g/dL   RDW 40.9 81.1 - 91.4 %   Platelets 343 150 - 400 K/uL   nRBC 0.0 0.0 - 0.2 %   Neutrophils Relative % 74 %   Neutro Abs 12.8 (H) 1.7 - 7.7 K/uL   Lymphocytes Relative 18 %   Lymphs Abs 3.1 0.7 - 4.0 K/uL   Monocytes Relative 7 %   Monocytes Absolute 1.2 (H) 0.1 - 1.0 K/uL   Eosinophils Relative 0 %   Eosinophils Absolute 0.1 0.0 - 0.5 K/uL   Basophils Relative 0 %   Basophils Absolute 0.0 0.0 - 0.1 K/uL   Immature Granulocytes 1 %   Abs Immature Granulocytes 0.10 (H) 0.00 - 0.07 K/uL  Comprehensive metabolic panel     Status: Abnormal   Collection Time: 05/12/23 11:41 PM  Result Value Ref Range   Sodium 134 (L) 135 - 145 mmol/L   Potassium 4.0 3.5 - 5.1 mmol/L   Chloride 99 98 - 111 mmol/L   CO2 23 22 - 32 mmol/L    Glucose, Bld 152 (H) 70 - 99 mg/dL   BUN 26 (H) 8 - 23 mg/dL   Creatinine, Ser 7.82 (H) 0.61 - 1.24 mg/dL   Calcium 8.6 (L) 8.9 - 10.3 mg/dL   Total Protein 6.4 (L) 6.5 - 8.1 g/dL   Albumin 2.8 (L) 3.5 - 5.0 g/dL   AST 15 15 - 41 U/L   ALT 15 0 - 44 U/L   Alkaline Phosphatase 79 38 - 126 U/L   Total Bilirubin 0.6 0.3 - 1.2 mg/dL   GFR, Estimated 60 (L) >60 mL/min   Anion gap 12 5 - 15  Lipase, blood     Status: None   Collection Time: 05/12/23 11:41 PM  Result Value Ref Range   Lipase 27 11 - 51 U/L  Troponin I (High Sensitivity)     Status: None   Collection Time: 05/12/23 11:41 PM  Result Value Ref Range   Troponin I (High Sensitivity) 6 <18 ng/L  Troponin I (High Sensitivity)     Status: None   Collection Time: 05/13/23  2:27 AM  Result Value Ref Range   Troponin I (High Sensitivity) 7 <18 ng/L  Expectorated Sputum Assessment w Gram Stain, Rflx to Resp Cult     Status: None (Preliminary result)   Collection Time: 05/13/23  3:09 AM   Specimen: Sputum  Result Value Ref Range   Specimen Description SPUTUM    Special Requests Normal    Sputum evaluation      THIS SPECIMEN IS ACCEPTABLE FOR SPUTUM CULTURE Performed at [facility], [address], [place], [place] [address]    Report Status PENDING   Culture, Respiratory w Gram Stain     Status: None (Preliminary result)   Collection Time: 05/13/23  3:09 AM   Specimen: SPU  Result Value Ref Range   Specimen Description SPUTUM    Special Requests Normal Reflexed from [id number]    Gram Stain      ABUNDANT WBC PRESENT, PREDOMINANTLY PMN RARE SQUAMOUS EPITHELIAL CELLS PRESENT ABUNDANT GRAM POSITIVE COCCI Performed at [facility], [address] [place], [place] [address]    Culture PENDING    Report Status PENDING   CBC     Status: Abnormal   Collection Time: 05/13/23  3:41 AM  Result Value Ref Range   WBC 17.1 (H) 4.0 - 10.5 K/uL   RBC 3.74 (L) 4.22 - 5.81 MIL/uL   Hemoglobin 11.2 (L) 13.0 - 17.0 g/dL    HCT 29.5 (L) 62.1 - 52.0 %   MCV 91.4 80.0 - 100.0 fL   MCH 29.9 26.0 - 34.0 pg   MCHC 32.7 30.0 - 36.0 g/dL   RDW 30.8 65.7 - 84.6 %   Platelets 353 150 - 400 K/uL   nRBC 0.0 0.0 - 0.2 %  Comprehensive metabolic panel     Status: Abnormal   Collection Time: 05/13/23  3:41 AM  Result Value Ref Range   Sodium 135 135 - 145 mmol/L   Potassium 4.6 3.5 - 5.1 mmol/L   Chloride 100 98 - 111 mmol/L   CO2 23 22 - 32 mmol/L   Glucose, Bld 161 (H) 70 - 99 mg/dL   BUN 26 (H) 8 - 23 mg/dL   Creatinine, Ser 9.62 0.61 - 1.24 mg/dL   Calcium 8.1 (L) 8.9 - 10.3 mg/dL   Total Protein 6.0 (L) 6.5 - 8.1 g/dL   Albumin 2.5 (L) 3.5 - 5.0 g/dL   AST 19 15 - 41 U/L   ALT 12 0 - 44 U/L   Alkaline Phosphatase 79 38 - 126 U/L   Total Bilirubin 0.9 0.3 - 1.2 mg/dL   GFR, Estimated >95 >28 mL/min   Anion gap 12 5 - 15     Imaging Orders         DG Chest 2 View         CT ABDOMEN PELVIS W CONTRAST         CT Angio Chest PE W and/or Wo Contrast         DG Abd Portable 1V-Small Bowel Protocol-Position Verification         DG Abd Portable 1V-Small Bowel Obstruction Protocol-initial, 8 hr delay      Assessment and Plan   [name] is an 72 y.o. male with a history of lung cancer and recent nausea vomiting and abdominal pain found to have a small bowel obstruction possibly related to a incarcerated right inguinal hernia which was reduced on exam.  He did not tolerate a nasogastric tube so he is just being kept NPO.  Hopefully, reducing this hernia will improve his symptoms.  If he does not improve, we may consider a small bowel protocol without the nasogastric tube for further evaluation and possible therapeutic effect.    ICD-10-CM   1. SBO (small bowel obstruction) (HCC)  K56.609     2. Small bowel obstruction (HCC)  K56.609 DG Abd Portable 1V-Small Bowel Obstruction Protocol-initial, 8 hr delay    DG Abd Portable 1V-Small Bowel Obstruction Protocol-initial, 8 hr delay       Quentin Ore, MD  [facility] Use [email or web address] to contact on call provider  New Patient Billing: [id number] - High MDM

## 2023-05-13 NOTE — ED Notes (Signed)
NG attempted with 2 mg Ativan given, patient could not tolerate having it placed.

## 2023-05-13 NOTE — Evaluation (Signed)
Physical Therapy Evaluation/ Discharge Patient Details Name: [name] MRN: [id number] DOB: 1951/02/13 Today's Date: 05/13/2023  History of Present Illness  72 yo male admitted 7/7 with N/V abdominal pain, SBO. PMhx: lung CA, T2DM, neuropathy, CKD, COPD, CAD, depression, glaucoma, HTN  Clinical Impression  Pt pleasant and eager for coffee. Pt reports independence at home, driving, mowing the yard and that wife has available DME should he need any. Pt with prior lung resection and reports use of home pulse ox daily with normal SPo2 >90% on RA. Pt with SPO2 91-96% on 2L with activity and will benefit from continued ambulatory monitoring prior to D/C. Pt performing long hall ambulation without physical assist and encouraged continued mobility acutely. Pt at or near baseline functional status without need for further acute therapy with pt in agreement, will sign off.         Assistance Recommended at Discharge PRN  If plan is discharge home, recommend the following:  Can travel by private vehicle           Equipment Recommendations None recommended by PT  Recommendations for Other Services       Functional Status Assessment Patient has not had a recent decline in their functional status     Precautions / Restrictions Precautions Precautions: Fall;Other (comment) Precaution Comments: watch sats Restrictions Weight Bearing Restrictions: No      Mobility  Bed Mobility               General bed mobility comments: in chair on arrival and end of session    Transfers Overall transfer level: Modified independent                 General transfer comment: pt able to stand from recliner without assist, management for IV/O2    Ambulation/Gait Ambulation/Gait assistance: Supervision Gait Distance (Feet): 550 Feet Assistive device: Rolling walker (2 wheels), None Gait Pattern/deviations: WFL(Within Functional Limits)   Gait velocity interpretation: >2.62  ft/sec, indicative of community ambulatory   General Gait Details: pt with good stride and speed with pt utilizing RW per therapist request for long hall ambulation but able to walk 30' without AD without LOB  Stairs            Wheelchair Mobility     Tilt Bed    Modified Rankin (Stroke Patients Only)       Balance Overall balance assessment: Mild deficits observed, not formally tested                                           Pertinent Vitals/Pain Pain Assessment Pain Assessment: No/denies pain    Home Living Family/patient expects to be discharged to:: Private residence Living Arrangements: Spouse/significant other;Children Available Help at Discharge: Family;Available 24 hours/day Type of Home: House Home Access: Stairs to enter Entrance Stairs-Rails: Right Entrance Stairs-Number of Steps: 3   Home Layout: One level Home Equipment: Agricultural consultant (2 wheels);Rollator (4 wheels);Cane - single point;Shower seat;Grab bars - toilet;Hand held shower head Additional Comments: wife uses rollator    Prior Function Prior Level of Function : Independent/Modified Independent;Driving             Mobility Comments: no AD ADLs Comments: reports indep, drives, manages medications. Son assists with IADLs     Hand Dominance   Dominant Hand: Right    Extremity/Trunk Assessment   Upper Extremity Assessment Upper Extremity  Assessment: Overall WFL for tasks assessed    Lower Extremity Assessment Lower Extremity Assessment: Overall WFL for tasks assessed    Cervical / Trunk Assessment Cervical / Trunk Assessment: Kyphotic  Communication   Communication: Expressive difficulties (decreased vocal volume after lung resection)  Cognition Arousal/Alertness: Awake/alert Behavior During Therapy: WFL for tasks assessed/performed Overall Cognitive Status: Within Functional Limits for tasks assessed                                           General Comments General comments (skin integrity, edema, etc.): VSS on 2L. SpO2 >92%. HR 100s    Exercises     Assessment/Plan    PT Assessment Patient does not need any further PT services  PT Problem List         PT Treatment Interventions      PT Goals (Current goals can be found in the Care Plan section)  Acute Rehab PT Goals PT Goal Formulation: All assessment and education complete, DC therapy    Frequency       Co-evaluation               AM-PAC PT "6 Clicks" Mobility  Outcome Measure Help needed turning from your back to your side while in a flat bed without using bedrails?: None Help needed moving from lying on your back to sitting on the side of a flat bed without using bedrails?: None Help needed moving to and from a bed to a chair (including a wheelchair)?: None Help needed standing up from a chair using your arms (e.g., wheelchair or bedside chair)?: A Little Help needed to walk in hospital room?: A Little Help needed climbing 3-5 steps with a railing? : A Little 6 Click Score: 21    End of Session Equipment Utilized During Treatment: Oxygen Activity Tolerance: Patient tolerated treatment well Patient left: in chair;with call bell/phone within reach;with chair alarm set Nurse Communication: Mobility status PT Visit Diagnosis: Other abnormalities of gait and mobility (R26.89)    Time: 1610-9604 PT Time Calculation (min) (ACUTE ONLY): 22 min   Charges:   PT Evaluation $PT Eval Low Complexity: 1 Low   PT General Charges $$ ACUTE PT VISIT: 1 Visit         [name], PT Acute Rehabilitation Services Office: [phone]   [name] [name] 05/13/2023, 12:29 PM

## 2023-05-13 NOTE — Sepsis Progress Note (Signed)
Code Sepsis protocol being monitored by eLink. Blood cultures and antibiotics started prior to code sepsis call. 1L LR bolus started at 0658. Bedside RN notified to have first lactic acid drawn as soon as possible.

## 2023-05-13 NOTE — TOC Initial Note (Signed)
Transition of Care [facility]) - Initial/Assessment Note    Patient Details  Name: [name] MRN: [id number] Date of Birth: 13-Sep-1951  [facility] CM/SW Contact:    Janae Bridgeman, RN Phone Number: 05/13/2023, 3:06 PM  Clinical Narrative:                 CM met with the patient at the bedside to discuss TOC needs.  The patient was resting but awakened easily for intake of history.  The patient remains on [place] oxygen at 3L/min [place].  The patient is normally independent and on RA but is a current smoker.  The patient lives at home with his spouse, who also drives.  Patient has DME at home - I will continue to follow for oxygen needs as needed.  Smoking cessation placed in the AVS.  The patient needs HH OT at home.  PT will be ordered as initial admission visit as well to provide safety assessment in the home.  Home health orders placed to be co-signed by the MD.  Amy, RNCM with [facility] called and she accepted for [facility] Pt, OT.  CM will continue to follow the patient for [facility] needs as patient progresses.  Patient receiving IV antibiotics for pneumonia and is not medically stable for discharge.  Expected Discharge Plan: Home w Home Health Services Barriers to Discharge: Continued Medical Work up   Patient Goals and CMS Choice Patient states their goals for this hospitalization and ongoing recovery are:: To return home CMS Medicare.gov Compare Post Acute Care list provided to:: Patient Choice offered to / list presented to : Patient [place] ownership interest in [facility].provided to:: Patient    Expected Discharge Plan and Services   Discharge Planning Services: CM Consult Post Acute Care Choice: Home Health Living arrangements for the past 2 months: Single Family Home                           HH Arranged: PT, OT HH Agency: [facility] Date [facility] Agency Contacted: 05/13/23 Time HH Agency Contacted: [phone] Representative spoke with at  [facility] Agency: Amy, RNCM with [facility] - accepted for PT/OT  Prior Living Arrangements/Services Living arrangements for the past 2 months: Single Family Home Lives with:: Spouse Patient language and need for interpreter reviewed:: Yes Do you feel safe going back to the place where you live?: Yes      Need for Family Participation in Patient Care: Yes (Comment) Care giver support system in place?: Yes (comment) Current home services: DME (DME at the home includes RW, Rolator, [employer], [employer] seat, Grab bars, shower head) Criminal Activity/Legal Involvement Pertinent to Current Situation/Hospitalization: No - Comment as needed  Activities of Daily Living Home Assistive Devices/Equipment: Eyeglasses ADL Screening (condition at time of admission) Weakness of Legs: Both Weakness of Arms/Hands: Both  Permission Sought/Granted Permission sought to share information with : Case Manager, Family Supports, Photographer granted to share info w AGENCY: [facility] accepted for PT/OT  Permission granted to share info w Relationship: spouse     Emotional Assessment Appearance:: Appears stated age Attitude/Demeanor/Rapport: Gracious Affect (typically observed): Accepting Orientation: : Oriented to Self, Oriented to Place, Oriented to  Time, Oriented to Situation Alcohol / Substance Use: Tobacco Use Psych Involvement: No (comment)  Admission diagnosis:  Small bowel obstruction (HCC) [K56.609] SBO (small bowel obstruction) (HCC) [K56.609] Patient Active Problem List  Diagnosis Date Noted   CKD stage 3b, GFR 30-44 ml/min (HCC) 05/13/2023   Small bowel obstruction (HCC) 05/13/2023   Malnutrition of moderate degree 03/21/2021   Sepsis (HCC) 03/20/2021   Acute renal failure superimposed on stage 3b chronic kidney disease (HCC)    DNR no code (do not resuscitate)    Rib fracture 08/05/2019   Primary cancer of left upper lobe of lung (HCC) 01/13/2019   Neoplasm of  uncertain behavior of vocal cord    Chronic sinusitis 07/04/2017   Primary cancer of glottis (HCC)    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 08/09/2016   CAD (coronary artery disease), native coronary artery 09/18/2015   Orthostatic hypotension 07/20/2015   Smoking 02/26/2015   Neurocardiogenic syncope 05/26/2013   Seizures (HCC) 05/03/2013   Chronic kidney disease    Community acquired pneumonia    Type II diabetes mellitus (HCC)    OSA on CPAP 09/26/2012   Neuropathic pain of both legs 04/23/2012   S/P lobectomy of lung 04/04/2012   Non-small cell carcinoma of lung, stage 1 (HCC) 03/17/2012    Class: Stage 1   Chest pain, atypical 12/21/2011   COPD exacerbation (HCC) 09/20/2011   Acute respiratory failure with hypoxia (HCC) 09/20/2011   Hypertension    Depression    GERD (gastroesophageal reflux disease)    CIGARETTE SMOKER 09/25/2007   DIZZINESS AND GIDDINESS 09/25/2007   Chronic obstructive pulmonary disease (HCC) 08/22/2007   PCP:  Donita Brooks, MD Pharmacy:   [facility] [phone] - [place], [place] - [address] AT [facility] [address] [place] [address] Phone: [phone] Fax: [phone]     Social Determinants of Health (SDOH) Social History: SDOH Screenings   Food Insecurity: No Food Insecurity (04/20/2021)  Housing: Low Risk  (04/20/2021)  Transportation Needs: No Transportation Needs (04/20/2021)  Alcohol Screen: Low Risk  (04/20/2021)  Depression (PHQ2-9): Low Risk  (12/10/2022)  Financial Resource Strain: Low Risk  (04/20/2021)  Physical Activity: Inactive (04/20/2021)  Social Connections: Moderately Isolated (04/20/2021)  Stress: No Stress Concern Present (04/20/2021)  Tobacco Use: High Risk (05/13/2023)   SDOH Interventions:     Readmission Risk Interventions    05/13/2023    3:04 PM  Readmission Risk Prevention Plan  Transportation Screening Complete  PCP or Specialist  Appt within 5-7 Days Complete  Home Care Screening Complete  Medication Review (RN CM) Complete

## 2023-05-13 NOTE — ED Notes (Signed)
ED TO INPATIENT HANDOFF REPORT  ED Nurse Name and Phone #: Dahlia Client [phone]  S Name/Age/Gender [name] 72 y.o. male Room/Bed: 017C/017C  Code Status   Code Status: Prior  Home/SNF/Other Home Patient oriented to: self, place, time, and situation Is this baseline? Yes   Triage Complete: Triage complete  Chief Complaint Small bowel obstruction (HCC) [K56.609]  Triage Note Medic was called out for abd pain, shortness of breath when medic arrived pt was place don O2 and the SOB improved but still was having abd pain that lead to multiple episodes of vomiting that medic gave 4mg  of zofran for that has alleviated some of the symptoms of nausea dn vomiting. Pain is regional on the abd. Also reports weight loss in the recent months with having a normal appetite.   Medic Vitals   108/60  20g IV left forearm    Allergies Allergies  Allergen Reactions   Actos [Pioglitazone] Swelling    EDEMA REACTION UNSPECIFIED   Avandia [Rosiglitazone] Swelling    SWELLING REACTION UNSPECIFIED    Dilaudid [Hydromorphone Hcl] Other (See Comments)    Made him crazy Hallucinations     Level of Care/Admitting Diagnosis ED Disposition     ED Disposition  Admit   Condition  --   Comment  Hospital Area: [facility] [[id number]]  Level of Care: Telemetry Medical [104]  May admit patient to [facility] or [facility] if equivalent level of care is available:: No  Covid Evaluation: Asymptomatic - no recent exposure (last 10 days) testing not required  Diagnosis: Small bowel obstruction [facility]) [[id number]]  Admitting Physician: Tereasa Coop [[id number]]  Attending Physician: Tereasa Coop [[id number]]  Certification:: I certify this patient will need inpatient services for at least 2 midnights          B Medical/Surgery History Past Medical History:  Diagnosis Date   Allergy    Anemia    Anginal pain (HCC)    Arthritis    "hands"   Asthma    Back pain    4  deteriorating disc and receives an injection q3-76mon;has been doing this for about 65yrs   Blood transfusion    as a child   Bronchitis    Cancer (HCC) dx'd 2013   Non-small cell lung cancer   Chronic kidney disease    acute kidney failure post surgery   COPD (chronic obstructive pulmonary disease) (HCC)    uses Albuterol and Spiriva daily   Coronary artery disease    has 1 stent   Depression    takes Prozac daily   Emphysema    sees Dr.Ramaswami for this   Family history of adverse reaction to anesthesia    mother was hard to wake up sometimes   GERD (gastroesophageal reflux disease)    takes Prilosec daily, EGD nml 06/2017   Glaucoma    hx of   H/O hiatal hernia    Hypertension    takes  HYzaar daily   Insomnia    takes Trazodone nightly   Lung mass    right upper lobe   Myocardial infarct [facility])    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   Obesity    Parathyroid disease (HCC)    Periodic limb movements of sleep    Peripheral neuropathy    Pneumonia    hx of' 72 yo,rd' last time in 2013   Productive cough    white in color but no odor  Shortness of breath    with exertion    Sleep apnea    uses BiPaP; "no longer have sleep apnea since I have lost over 100 lbs"   Syncope and collapse 05/26/2013   Thyroid disease    Type II diabetes mellitus (HCC)    takes Metformin bid and Novolog and Lantus daily   Past Surgical History:  Procedure Laterality Date   CARDIAC CATHETERIZATION  2006/2008/2009   CARPAL TUNNEL RELEASE     bilateral   COLONOSCOPY     CORONARY ANGIOPLASTY WITH STENT PLACEMENT     1 stent   ELBOW SURGERY     ulnar nerve; left   ESOPHAGOGASTRODUODENOSCOPY     EYE SURGERY     pt. denies   FUDUCIAL PLACEMENT Left 08/24/2016   Procedure: PLACEMENT OF FUDUCIAL;  Surgeon: Loreli Slot, MD;  Location: [facility] OR;  Service: Thoracic;  Laterality: Left;   INGUINAL HERNIA REPAIR  1990's   double inguinal   LOBECTOMY  03/17/2012    upper right side with resection   MICROLARYNGOSCOPY WITH CO2 LASER AND EXCISION OF VOCAL CORD LESION N/A 06/28/2017   Procedure: MICROLARYNGOSCOPY WITH BIOPSY;  Surgeon: Christia Reading, MD;  Location: [facility] OR;  Service: ENT;  Laterality: N/A;   MICROLARYNGOSCOPY WITH CO2 LASER AND EXCISION OF VOCAL CORD LESION N/A 07/19/2017   Procedure: MICROLARYNGOSCOPY WITH CO2 LASER AND EXCISION OF VOCAL CORD LESION;  Surgeon: Christia Reading, MD;  Location: [facility] OR;  Service: ENT;  Laterality: N/A;  MICRO DIRECT LARYNGOSCOPY WITH CO2 LASER VOCAL CORD STRIPPING/POSS JET VENTURI VENTILATION   REFRACTIVE SURGERY     bilaterally   ROTATOR CUFF REPAIR     left   VIDEO BRONCHOSCOPY WITH ENDOBRONCHIAL NAVIGATION N/A 08/24/2016   Procedure: VIDEO BRONCHOSCOPY WITH ENDOBRONCHIAL NAVIGATION;  Surgeon: Loreli Slot, MD;  Location: [facility] OR;  Service: Thoracic;  Laterality: N/A;     A IV Location/Drains/Wounds Patient Lines/Drains/Airways Status     Active Line/Drains/Airways     Name Placement date Placement time Site Days   Peripheral IV 05/12/23 20 G Anterior;Left Forearm 05/12/23  2337  Forearm  1   Peripheral IV 05/13/23 18 G 1.16" Right Antecubital 05/13/23  0030  Antecubital  less than 1            Intake/Output Last 24 hours No intake or output data in the 24 hours ending 05/13/23 0314  Labs/Imaging Results for orders placed or performed during the hospital encounter of 05/12/23 (from the past 48 hour(s))  CBC with Differential     Status: Abnormal   Collection Time: 05/12/23 11:41 PM  Result Value Ref Range   WBC 17.3 (H) 4.0 - 10.5 K/uL   RBC 3.92 (L) 4.22 - 5.81 MIL/uL   Hemoglobin 11.8 (L) 13.0 - 17.0 g/dL   HCT 16.1 (L) 09.6 - 04.5 %   MCV 90.1 80.0 - 100.0 fL   MCH 30.1 26.0 - 34.0 pg   MCHC 33.4 30.0 - 36.0 g/dL   RDW 40.9 81.1 - 91.4 %   Platelets 343 150 - 400 K/uL   nRBC 0.0 0.0 - 0.2 %   Neutrophils Relative % 74 %   Neutro Abs 12.8 (H) 1.7 - 7.7 K/uL   Lymphocytes Relative 18  %   Lymphs Abs 3.1 0.7 - 4.0 K/uL   Monocytes Relative 7 %   Monocytes Absolute 1.2 (H) 0.1 - 1.0 K/uL   Eosinophils Relative 0 %   Eosinophils Absolute 0.1 0.0 - 0.5 K/uL  Basophils Relative 0 %   Basophils Absolute 0.0 0.0 - 0.1 K/uL   Immature Granulocytes 1 %   Abs Immature Granulocytes 0.10 (H) 0.00 - 0.07 K/uL    Comment: Performed at [facility], [address] [place], [place] [address]  Comprehensive metabolic panel     Status: Abnormal   Collection Time: 05/12/23 11:41 PM  Result Value Ref Range   Sodium 134 (L) 135 - 145 mmol/L   Potassium 4.0 3.5 - 5.1 mmol/L   Chloride 99 98 - 111 mmol/L   CO2 23 22 - 32 mmol/L   Glucose, Bld 152 (H) 70 - 99 mg/dL    Comment: Glucose reference range applies only to samples taken after fasting for at least 8 hours.   BUN 26 (H) 8 - 23 mg/dL   Creatinine, Ser 6.96 (H) 0.61 - 1.24 mg/dL   Calcium 8.6 (L) 8.9 - 10.3 mg/dL   Total Protein 6.4 (L) 6.5 - 8.1 g/dL   Albumin 2.8 (L) 3.5 - 5.0 g/dL   AST 15 15 - 41 U/L   ALT 15 0 - 44 U/L   Alkaline Phosphatase 79 38 - 126 U/L   Total Bilirubin 0.6 0.3 - 1.2 mg/dL   GFR, Estimated 60 (L) >60 mL/min    Comment: (NOTE) Calculated using the CKD-EPI Creatinine Equation (2021)    Anion gap 12 5 - 15    Comment: Performed at [facility], [address] [place], [place] [address]  Lipase, blood     Status: None   Collection Time: 05/12/23 11:41 PM  Result Value Ref Range   Lipase 27 11 - 51 U/L    Comment: Performed at [facility], [address] [place], [place] [address]  Troponin I (High Sensitivity)     Status: None   Collection Time: 05/12/23 11:41 PM  Result Value Ref Range   Troponin I (High Sensitivity) 6 <18 ng/L    Comment: (NOTE) Elevated high sensitivity troponin I (hsTnI) values and significant  changes across serial measurements may suggest ACS but many other  chronic and acute conditions are known to elevate hsTnI results.  Refer to the "Links"  section for chest pain algorithms and additional  guidance. Performed at [facility], [address], [place], [place] [address]    CT ABDOMEN PELVIS W CONTRAST  Result Date: 05/13/2023 CLINICAL DATA:  Pulmonary embolism suspected, high probability. Shortness of breath. History of lung cancer. Recurrent weight loss. EXAM: CT ANGIOGRAPHY CHEST CT ABDOMEN AND PELVIS WITH CONTRAST TECHNIQUE: Multidetector CT imaging of the chest was performed using the standard protocol during bolus administration of intravenous contrast. Multiplanar CT image reconstructions and MIPs were obtained to evaluate the vascular anatomy. Multidetector CT imaging of the abdomen and pelvis was performed using the standard protocol during bolus administration of intravenous contrast. RADIATION DOSE REDUCTION: This exam was performed according to the departmental dose-optimization program which includes automated exposure control, adjustment of the mA and/or kV according to patient size and/or use of iterative reconstruction technique. CONTRAST:  75mL OMNIPAQUE IOHEXOL 350 MG/ML SOLN COMPARISON:  03/12/2022, 09/24/2012. FINDINGS: CTA CHEST FINDINGS Cardiovascular: Heart is normal in size and there is no pericardial effusion. Multi-vessel coronary artery calcifications are noted. There is atherosclerotic calcification of the aorta without evidence of aneurysm. The pulmonary trunk is normal in caliber. No evidence of pulmonary embolism. Mediastinum/Nodes: No mediastinal, hilar, or axillary lymphadenopathy by size criteria. The thyroid gland, trachea, and esophagus are  within normal limits. There is a small hiatal hernia. Lungs/Pleura: Surgical changes are noted in the right lung. There is bronchiectasis with bronchial wall thickening bilaterally and multifocal mucous plugging in the lower lobes bilaterally. Patchy airspace disease is present in the lungs bilaterally with scattered centrilobular nodules. Emphysematous changes are noted  in the lungs. There is stable scarring in the left upper lobe. No effusion or pneumothorax. Musculoskeletal: Degenerative changes are present in the thoracic spine. No acute or suspicious osseous abnormality. Review of the MIP images confirms the above findings. CT ABDOMEN and PELVIS FINDINGS Hepatobiliary: No focal liver abnormality is seen. No gallstones, gallbladder wall thickening, or biliary dilatation. Pancreas: Unremarkable. No pancreatic ductal dilatation or surrounding inflammatory changes. Spleen: Normal in size without focal abnormality. Adrenals/Urinary Tract: The adrenal glands are within normal limits. The kidneys enhance symmetrically. Renal cysts are present bilaterally. Additional subcentimeter hypodensities are noted in the kidneys which are too small to further characterize. Calcifications are noted in the kidneys bilaterally, possible vascular calcifications versus renal calculi. No hydronephrosis. The bladder is unremarkable. Stomach/Bowel: There is thickening of the walls of the gastric rugae. Multiple loops of distended small bowel are noted in the abdomen with scattered air-fluid levels. There are bilateral inguinal hernias containing loops of small bowel, however a clear transition point is not well delineated. The distal small bowel is decompressed. No free air or pneumatosis. Appendix is not definitely seen. Evaluation of the bowel is limited due to lack oral contrast and paucity of intra-abdominal fat. Vascular/Lymphatic: Aortic atherosclerosis. No abdominal or pelvic lymphadenopathy. Reproductive: Prostate is unremarkable. Other: No abdominopelvic ascites. Musculoskeletal: Degenerative changes are present in the lumbar spine. No acute osseous abnormality Review of the MIP images confirms the above findings. IMPRESSION: 1. No evidence of pulmonary embolism. 2. Multiple dilated loops of small bowel in the abdomen. There are bilateral inguinal hernias containing small bowel, however an  obstruction point is not well delineated due to lack of IV contrast and paucity of intra-abdominal fat. The distal small bowel is decompressed. Findings are concerning for small bowel obstruction. Surgical consultation is recommended. 3. Bilateral bronchiectasis with bronchial wall thickening, multifocal mucous plugging, centrilobular nodules, and patchy airspace disease in the lungs bilaterally concerning for infection. Follow-up is recommended until resolution. 4. Emphysema. 5. Thickening of the walls of the gastric rugae suggesting gastritis. 6. Coronary artery calcifications and aortic atherosclerosis. 7. Remaining incidental findings as described above. Critical Value/emergent results were called by telephone at the time of interpretation on 05/13/2023 at 1:55 am to provider [facility] , who verbally acknowledged these results. Electronically Signed   By: Thornell Sartorius M.D.   On: 05/13/2023 01:55   CT Angio Chest PE W and/or Wo Contrast  Result Date: 05/13/2023 CLINICAL DATA:  Pulmonary embolism suspected, high probability. Shortness of breath. History of lung cancer. Recurrent weight loss. EXAM: CT ANGIOGRAPHY CHEST CT ABDOMEN AND PELVIS WITH CONTRAST TECHNIQUE: Multidetector CT imaging of the chest was performed using the standard protocol during bolus administration of intravenous contrast. Multiplanar CT image reconstructions and MIPs were obtained to evaluate the vascular anatomy. Multidetector CT imaging of the abdomen and pelvis was performed using the standard protocol during bolus administration of intravenous contrast. RADIATION DOSE REDUCTION: This exam was performed according to the departmental dose-optimization program which includes automated exposure control, adjustment of the mA and/or kV according to patient size and/or use of iterative reconstruction technique. CONTRAST:  75mL OMNIPAQUE IOHEXOL 350 MG/ML SOLN COMPARISON:  03/12/2022, 09/24/2012. FINDINGS: CTA CHEST FINDINGS Cardiovascular:  Heart is normal in size and there is no pericardial effusion. Multi-vessel coronary artery calcifications are noted. There is atherosclerotic calcification of the aorta without evidence of aneurysm. The pulmonary trunk is normal in caliber. No evidence of pulmonary embolism. Mediastinum/Nodes: No mediastinal, hilar, or axillary lymphadenopathy by size criteria. The thyroid gland, trachea, and esophagus are within normal limits. There is a small hiatal hernia. Lungs/Pleura: Surgical changes are noted in the right lung. There is bronchiectasis with bronchial wall thickening bilaterally and multifocal mucous plugging in the lower lobes bilaterally. Patchy airspace disease is present in the lungs bilaterally with scattered centrilobular nodules. Emphysematous changes are noted in the lungs. There is stable scarring in the left upper lobe. No effusion or pneumothorax. Musculoskeletal: Degenerative changes are present in the thoracic spine. No acute or suspicious osseous abnormality. Review of the MIP images confirms the above findings. CT ABDOMEN and PELVIS FINDINGS Hepatobiliary: No focal liver abnormality is seen. No gallstones, gallbladder wall thickening, or biliary dilatation. Pancreas: Unremarkable. No pancreatic ductal dilatation or surrounding inflammatory changes. Spleen: Normal in size without focal abnormality. Adrenals/Urinary Tract: The adrenal glands are within normal limits. The kidneys enhance symmetrically. Renal cysts are present bilaterally. Additional subcentimeter hypodensities are noted in the kidneys which are too small to further characterize. Calcifications are noted in the kidneys bilaterally, possible vascular calcifications versus renal calculi. No hydronephrosis. The bladder is unremarkable. Stomach/Bowel: There is thickening of the walls of the gastric rugae. Multiple loops of distended small bowel are noted in the abdomen with scattered air-fluid levels. There are bilateral inguinal hernias  containing loops of small bowel, however a clear transition point is not well delineated. The distal small bowel is decompressed. No free air or pneumatosis. Appendix is not definitely seen. Evaluation of the bowel is limited due to lack oral contrast and paucity of intra-abdominal fat. Vascular/Lymphatic: Aortic atherosclerosis. No abdominal or pelvic lymphadenopathy. Reproductive: Prostate is unremarkable. Other: No abdominopelvic ascites. Musculoskeletal: Degenerative changes are present in the lumbar spine. No acute osseous abnormality Review of the MIP images confirms the above findings. IMPRESSION: 1. No evidence of pulmonary embolism. 2. Multiple dilated loops of small bowel in the abdomen. There are bilateral inguinal hernias containing small bowel, however an obstruction point is not well delineated due to lack of IV contrast and paucity of intra-abdominal fat. The distal small bowel is decompressed. Findings are concerning for small bowel obstruction. Surgical consultation is recommended. 3. Bilateral bronchiectasis with bronchial wall thickening, multifocal mucous plugging, centrilobular nodules, and patchy airspace disease in the lungs bilaterally concerning for infection. Follow-up is recommended until resolution. 4. Emphysema. 5. Thickening of the walls of the gastric rugae suggesting gastritis. 6. Coronary artery calcifications and aortic atherosclerosis. 7. Remaining incidental findings as described above. Critical Value/emergent results were called by telephone at the time of interpretation on 05/13/2023 at 1:55 am to provider [facility] , who verbally acknowledged these results. Electronically Signed   By: Thornell Sartorius M.D.   On: 05/13/2023 01:55   DG Chest 2 View  Result Date: 05/13/2023 CLINICAL DATA:  Shortness of breath EXAM: CHEST - 2 VIEW COMPARISON:  None Available. FINDINGS: Cardiomediastinal contours are normal. There is right parahilar scarring. Bibasilar atelectasis. No pleural  effusion. Left apical opacity. Status post right upper lobectomy. IMPRESSION: 1. Left apical opacity, progressed from prior, favored to be scarring. 2. Right parahilar scarring and bibasilar atelectasis. Electronically Signed   By: Deatra Robinson M.D.   On: 05/13/2023 00:16    Pending Labs [employer] (  From admission, onward)     Start     Ordered   05/13/23 0500  CBC  Tomorrow morning,   R        05/13/23 0313   05/13/23 0500  Comprehensive metabolic panel  Tomorrow morning,   R        05/13/23 0313   05/13/23 0309  Expectorated Sputum Assessment w Gram Stain, Rflx to Resp Cult  Once,   R       Question Answer Comment  Patient immune status Normal   Release to patient Immediate      05/13/23 0310   05/13/23 0309  Strep pneumoniae urinary antigen  Once,   R        05/13/23 0310   05/13/23 0309  Legionella Pneumophila Serogp 1 Ur Ag  Once,   R        05/13/23 0310   05/13/23 0306  HIV Antibody (routine testing w rflx)  (HIV Antibody (Routine testing w reflex) panel)  Once,   R        05/13/23 0310   05/13/23 0212  Blood culture (routine x 2)  BLOOD CULTURE X 2,   R (with STAT occurrences)      05/13/23 0211   05/12/23 2334  Urinalysis, Routine w reflex microscopic -Urine, Clean Catch  Once,   URGENT       Question:  Specimen Source  Answer:  Urine, Clean Catch   05/12/23 2334            Vitals/Pain Today's Vitals   05/13/23 0055 05/13/23 0219 05/13/23 0259 05/13/23 0300  BP: 94/69 92/61  103/65  Pulse: 67 91  (!) 102  Resp: 17 16  (!) 25  SpO2: 93% 93%  91%  PainSc:   4      Isolation Precautions No active isolations  Medications Medications  cefTRIAXone (ROCEPHIN) 1 g in sodium chloride 0.9 % 100 mL IVPB (1 g Intravenous New Bag/Given 05/13/23 0255)  diatrizoate meglumine-sodium (GASTROGRAFIN) 66-10 % solution 90 mL (has no administration in time range)  cefTRIAXone (ROCEPHIN) 2 g in sodium chloride 0.9 % 100 mL IVPB (has no administration in time range)   azithromycin (ZITHROMAX) 500 mg in sodium chloride 0.9 % 250 mL IVPB (has no administration in time range)  methylPREDNISolone sodium succinate (SOLU-MEDROL) 125 mg/2 mL injection 125 mg (has no administration in time range)  ipratropium-albuterol (DUONEB) 0.5-2.5 (3) MG/3ML nebulizer solution 3 mL (has no administration in time range)  albuterol (PROVENTIL) (2.5 MG/3ML) 0.083% nebulizer solution 2.5 mg (has no administration in time range)  LORazepam (ATIVAN) injection 2 mg (has no administration in time range)  iohexol (OMNIPAQUE) 350 MG/ML injection 75 mL (75 mLs Intravenous Contrast Given 05/13/23 0120)  fentaNYL (SUBLIMAZE) injection 50 mcg (50 mcg Intravenous Given 05/13/23 0221)  ondansetron (ZOFRAN) injection 4 mg (4 mg Intravenous Given 05/13/23 0221)    Mobility walks     Focused Assessments     R Recommendations: See Admitting Provider Note  Report given to:   Additional Notes:

## 2023-05-13 NOTE — Evaluation (Signed)
Occupational Therapy Evaluation Patient Details Name: [name] MRN: [id number] DOB: 11-03-1951 Today's Date: 05/13/2023   History of Present Illness 72 yo male admitted 7/7 with N/V abdominal pain, SBO. PMhx: lung CA, T2DM, neuropathy, CKD, COPD, CAD, depression, glaucoma, HTN   Clinical Impression   [name] was evaluated s/p the above admission list. He reports being indep at baseline, he lives with his wife who uses AD and son. Upon evaluation the pt was limited by generalized weakness, abdominal pain, unsteady balance, limited insight to deficits and poor activity tolerance. Overall he needed up to min G for simple tranfers and to take pivotal steps with the RW. Due to the deficits listed below the pt also needs he also needs up to min G for ADLs with increased time. Pt will benefit from continued acute OT services and HHOT.       Recommendations for follow up therapy are one component of a multi-disciplinary discharge planning process, led by the attending physician.  Recommendations may be updated based on patient status, additional functional criteria and insurance authorization.   Assistance Recommended at Discharge Frequent or constant Supervision/Assistance  Patient can return home with the following A little help with walking and/or transfers;A little help with bathing/dressing/bathroom;Assistance with cooking/housework;Direct supervision/assist for medications management;Direct supervision/assist for financial management;Assist for transportation;Help with stairs or ramp for entrance    Functional Status Assessment  Patient has had a recent decline in their functional status and demonstrates the ability to make significant improvements in function in a reasonable and predictable amount of time.  Equipment Recommendations  None recommended by OT       Precautions / Restrictions Precautions Precautions: Fall Restrictions Weight Bearing Restrictions: No      Mobility  Bed Mobility Overal bed mobility: Needs Assistance Bed Mobility: Supine to Sit     Supine to sit: Supervision          Transfers Overall transfer level: Needs assistance Equipment used: Rolling walker (2 wheels) Transfers: Sit to/from Stand, Bed to chair/wheelchair/BSC Sit to Stand: Min guard     Step pivot transfers: Min guard     General transfer comment: initially declined RW but beenfits from BUE support for stepping      Balance Overall balance assessment: Needs assistance Sitting-balance support: Feet supported Sitting balance-Leahy Scale: Good     Standing balance support: Bilateral upper extremity supported, During functional activity Standing balance-Leahy Scale: Poor                             ADL either performed or assessed with clinical judgement   ADL Overall ADL's : Needs assistance/impaired Eating/Feeding: NPO   Grooming: Set up;Sitting   Upper Body Bathing: Set up;Sitting   Lower Body Bathing: Min guard;Sit to/from stand   Upper Body Dressing : Set up;Sitting   Lower Body Dressing: Min guard;Sit to/from stand Lower Body Dressing Details (indicate cue type and reason): able to don socks at EOB Toilet Transfer: Min guard;Stand-pivot;Rolling walker (2 wheels) Toilet Transfer Details (indicate cue type and reason): benefits from RW Toileting- Clothing Manipulation and Hygiene: Supervision/safety;Sitting/lateral lean       Functional mobility during ADLs: Min guard;Rolling walker (2 wheels) General ADL Comments: pt initially declining RW, however benefits from BUE support     Vision Baseline Vision/History: 0 No visual deficits Vision Assessment?: No apparent visual deficits     Perception Perception Perception Tested?: No   Praxis Praxis Praxis tested?: Not tested  Pertinent Vitals/Pain Pain Assessment Pain Assessment: Faces Faces Pain Scale: Hurts little more Pain Location: abdomen Pain Descriptors / Indicators:  Discomfort Pain Intervention(s): Limited activity within patient's tolerance, Monitored during session     Hand Dominance Right   Extremity/Trunk Assessment Upper Extremity Assessment Upper Extremity Assessment: Generalized weakness   Lower Extremity Assessment Lower Extremity Assessment: Defer to PT evaluation   Cervical / Trunk Assessment Cervical / Trunk Assessment: Kyphotic   Communication Communication Communication: Expressive difficulties (quiet, effortful speech)   Cognition Arousal/Alertness: Awake/alert Behavior During Therapy: WFL for tasks assessed/performed Overall Cognitive Status: No family/caregiver present to determine baseline cognitive functioning                                 General Comments: overall WFL and follows all commands. Limited insight to deficits. Pt perseverating on wanting food/coffee, despite education on NPO status     General Comments  VSS on 2L. SpO2 >92%. HR 100s     Home Living Family/patient expects to be discharged to:: Private residence Living Arrangements: Spouse/significant other;Children (wife and son) Available Help at Discharge: Family;Available 24 hours/day Type of Home: House Home Access: Stairs to enter [employer] of Steps: 3 Entrance Stairs-Rails: Right Home Layout: One level     Bathroom Shower/Tub: Tub/shower unit;Walk-in shower   Bathroom Toilet: Handicapped height     Home Equipment: Agricultural consultant (2 wheels);Rollator (4 wheels);Cane - single point;Shower seat;Grab bars - toilet;Hand held shower head   Additional Comments: wife uses rollator      Prior Functioning/Environment Prior Level of Function : Independent/Modified Independent;Driving             Mobility Comments: no AD ADLs Comments: reports indep, drives, manages medicaitons. Son assists with IADLs        OT Problem List: Decreased strength;Decreased range of motion;Decreased activity tolerance;Impaired  balance (sitting and/or standing);Decreased knowledge of use of DME or AE;Decreased safety awareness;Decreased knowledge of precautions;Cardiopulmonary status limiting activity      OT Treatment/Interventions: Self-care/ADL training;Therapeutic exercise;Energy conservation;DME and/or AE instruction;Therapeutic activities;Patient/family education;Balance training    OT Goals(Current goals can be found in the care plan section) Acute Rehab OT Goals Patient Stated Goal: to eat OT Goal Formulation: With patient Time For Goal Achievement: 05/27/23 Potential to Achieve Goals: Good ADL Goals Pt Will Perform Lower Body Dressing: with modified independence;sit to/from stand Pt Will Transfer to Toilet: with modified independence;ambulating Additional ADL Goal #1: Pt will complete at least 10 minutes of OOB activity without a sitting rest break to demonstrate increased activity tolerace Additional ADL Goal #2: Pt will verbalize at least 3 energy conservation strategies in preparation for safe d/c home  OT Frequency: Min 2X/week       AM-PAC OT "6 Clicks" Daily Activity     Outcome Measure Help from another person eating meals?: Total Help from another person taking care of personal grooming?: A Little Help from another person toileting, which includes using toliet, bedpan, or urinal?: A Little Help from another person bathing (including washing, rinsing, drying)?: A Little Help from another person to put on and taking off regular upper body clothing?: A Little Help from another person to put on and taking off regular lower body clothing?: A Little 6 Click Score: 16   End of Session Equipment Utilized During Treatment: Gait belt;Rolling walker (2 wheels);Oxygen Nurse Communication: Mobility status  Activity Tolerance: Patient tolerated treatment well Patient left: in chair;with call bell/phone within reach;with  bed alarm set  OT Visit Diagnosis: Unsteadiness on feet (R26.81);Other abnormalities  of gait and mobility (R26.89);Muscle weakness (generalized) (M62.81)                Time: 1000-1021 OT Time Calculation (min): 21 min Charges:  OT General Charges $OT Visit: 1 Visit OT Evaluation $OT Eval Moderate Complexity: 1 Mod  Derenda Mis, OTR/L Acute Rehabilitation Services Office [phone] Secure Chat Communication Preferred   Donia Pounds 05/13/2023, 10:43 AM

## 2023-05-13 NOTE — ED Notes (Signed)
NG attempted x 2 with great resistance met. Provider notified.

## 2023-05-13 NOTE — ED Notes (Signed)
NG tube attempted multiple times by multiple staff members. Unsuccessful.

## 2023-05-13 NOTE — H&P (Signed)
History and Physical    [name] ATF:[id number] DOB: 07-10-1951 DOA: 05/12/2023  PCP: Donita Brooks, MD   Patient coming from: Home   Chief Complaint:  Chief Complaint  Patient presents with   Abdominal Pain    HPI:  72 year old man medical history of CAD status post remote right RCA stent placement, lung cancer status post partial right lung resection, hypertension, COPD, OSA, diabetes mellitus type 2 presented to emergency department with abdominal pain, nausea vomiting since this morning 05/12/2023.  Patient reported he has last bowel movement on Friday, 05/11/2023.  Patient is also complaining of increased productive cough, shortness of breath and breathing of foul-smelling sputum. Denies any chest pain, palpitation, headache, fever and chills.  Denies any diarrhea and constipation.  No previous history of abdominal surgery.  ED Course:  ED on initial presentation's blood pressure borderline soft 108/74, heart rate 80, respiratory rate 93% on room air. CBC showed elevated WBC count 17.3, slight low hemoglobin 11.8. CMP showed slightly low blood glucose 134, potassium 4, chloride 99, bicarb 23, creatinine 1.2, low albumin 2.8, GFR 60. High sensitive troponin 6 within normal range. Chest x-ray showed left apical opacity progressed from prior and right perihilar scarring and bibasilar atelectasis.  CTA chest ruled out any PE.  However it showed multiple dilated loops of small bowel in the abdomen concern for small bowel obstruction.  Following CT abdomen pelvis showed multiple dilated loops of small bowel in the abdomen, bilateral inguinal hernia containing small bowel, however an obstruction point is not well delineated due to lack of IV contrast and paucity of intra-abdominal fat.  Distal small bowel obstruction and surgery consultation is recommended.  I have reached out to general surgery Dr. Ivar Drape as multiple attempt to placement of NG tube failed in the  setting of SBO.    Concern for pneumonia on the chest x-ray in the ED patient begins treatment with ceftriaxone 1 g.  Hospitalist team has been consulted to admit the patient for management of SBO and community-acquired pneumonia.  Review of Systems:  Review of Systems  Constitutional:  Positive for chills. Negative for fever, malaise/fatigue and weight loss.  Respiratory:  Positive for cough, sputum production and shortness of breath. Negative for hemoptysis and wheezing.   Cardiovascular:  Negative for chest pain, palpitations and leg swelling.  Gastrointestinal:  Positive for abdominal pain, constipation, nausea and vomiting. Negative for diarrhea.  Genitourinary:  Negative for dysuria, frequency and urgency.  Musculoskeletal:  Negative for myalgias and neck pain.  Neurological:  Positive for dizziness. Negative for weakness and headaches.  Psychiatric/Behavioral:  The patient is nervous/anxious.     Past Medical History:  Diagnosis Date   Allergy    Anemia    Anginal pain (HCC)    Arthritis    "hands"   Asthma    Back pain    4 deteriorating disc and receives an injection q3-36mon;has been doing this for about 54yrs   Blood transfusion    as a child   Bronchitis    Cancer (HCC) dx'd 2013   Non-small cell lung cancer   Chronic kidney disease    acute kidney failure post surgery   COPD (chronic obstructive pulmonary disease) (HCC)    uses Albuterol and Spiriva daily   Coronary artery disease    has 1 stent   Depression    takes Prozac daily   Emphysema    sees Dr.Ramaswami for this   Family history of adverse reaction to anesthesia  mother was hard to wake up sometimes   GERD (gastroesophageal reflux disease)    takes Prilosec daily, EGD nml 06/2017   Glaucoma    hx of   H/O hiatal hernia    Hypertension    takes  HYzaar daily   Insomnia    takes Trazodone nightly   Lung mass    right upper lobe   Myocardial infarct [facility])    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   Obesity    Parathyroid disease (HCC)    Periodic limb movements of sleep    Peripheral neuropathy    Pneumonia    hx of' 72 yo,rd' last time in 2013   Productive cough    white in color but no odor   Shortness of breath    with exertion    Sleep apnea    uses BiPaP; "no longer have sleep apnea since I have lost over 100 lbs"   Syncope and collapse 05/26/2013   Thyroid disease    Type II diabetes mellitus (HCC)    takes Metformin bid and Novolog and Lantus daily    Past Surgical History:  Procedure Laterality Date   CARDIAC CATHETERIZATION  2006/2008/2009   CARPAL TUNNEL RELEASE     bilateral   COLONOSCOPY     CORONARY ANGIOPLASTY WITH STENT PLACEMENT     1 stent   ELBOW SURGERY     ulnar nerve; left   ESOPHAGOGASTRODUODENOSCOPY     EYE SURGERY     pt. denies   FUDUCIAL PLACEMENT Left 08/24/2016   Procedure: PLACEMENT OF FUDUCIAL;  Surgeon: Loreli Slot, MD;  Location: [facility] OR;  Service: Thoracic;  Laterality: Left;   INGUINAL HERNIA REPAIR  1990's   double inguinal   LOBECTOMY  03/17/2012   upper right side with resection   MICROLARYNGOSCOPY WITH CO2 LASER AND EXCISION OF VOCAL CORD LESION N/A 06/28/2017   Procedure: MICROLARYNGOSCOPY WITH BIOPSY;  Surgeon: Christia Reading, MD;  Location: [facility] OR;  Service: ENT;  Laterality: N/A;   MICROLARYNGOSCOPY WITH CO2 LASER AND EXCISION OF VOCAL CORD LESION N/A 07/19/2017   Procedure: MICROLARYNGOSCOPY WITH CO2 LASER AND EXCISION OF VOCAL CORD LESION;  Surgeon: Christia Reading, MD;  Location: [facility] OR;  Service: ENT;  Laterality: N/A;  MICRO DIRECT LARYNGOSCOPY WITH CO2 LASER VOCAL CORD STRIPPING/POSS JET VENTURI VENTILATION   REFRACTIVE SURGERY     bilaterally   ROTATOR CUFF REPAIR     left   VIDEO BRONCHOSCOPY WITH ENDOBRONCHIAL NAVIGATION N/A 08/24/2016   Procedure: VIDEO BRONCHOSCOPY WITH ENDOBRONCHIAL NAVIGATION;  Surgeon: Loreli Slot, MD;  Location: [facility] OR;  Service: Thoracic;  Laterality:  N/A;     reports that he has been smoking cigarettes. He started smoking about 61 years ago. He has a 162.00 pack-year smoking history. He has never used smokeless tobacco. He reports current drug use. Drugs: Cocaine and Marijuana. He reports that he does not drink alcohol.  Allergies  Allergen Reactions   Actos [Pioglitazone] Swelling    EDEMA REACTION UNSPECIFIED   Avandia [Rosiglitazone] Swelling    SWELLING REACTION UNSPECIFIED    Dilaudid [Hydromorphone Hcl] Other (See Comments)    Made him crazy Hallucinations     Family History  Problem Relation Age of Onset   COPD Mother    Diabetes Mother    Hypertension Mother    Heart attack Father    Diabetes Father    Anesthesia problems Neg Hx    Hypotension  Neg Hx    Malignant hyperthermia Neg Hx    Pseudochol deficiency Neg Hx     Prior to Admission medications   Medication Sig Start Date End Date Taking? Authorizing Provider  atorvastatin (LIPITOR) 20 MG tablet Take 1 tablet (20 mg total) by mouth daily. 01/28/23  Yes Jake Bathe, MD  Budeson-Glycopyrrol-Formoterol (BREZTRI AEROSPHERE) 160-9-4.8 MCG/ACT AERO Inhale into the lungs. 2 puffs in the morning and 2 puffs at night 02/13/22  Yes [provider]  diltiazem (CARDIZEM CD) 120 MG 24 hr capsule Take 1 capsule (120 mg total) by mouth daily. 04/15/23 07/14/23 Yes Jake Bathe, MD  famotidine (PEPCID) 10 MG tablet Take 10 mg by mouth daily as needed for heartburn.   Yes [provider]  FLUoxetine (PROZAC) 40 MG capsule TAKE 1 CAPSULE BY MOUTH EVERY DAY Patient taking differently: Take 40 mg by mouth daily. 10/25/22  Yes Donita Brooks, MD  gabapentin (NEURONTIN) 300 MG capsule TAKE 1 CAPSULE BY MOUTH THREE TIMES A DAY Patient taking differently: Take 300 mg by mouth 3 (three) times daily. 08/31/22  Yes Donita Brooks, MD  ipratropium-albuterol (DUONEB) 0.5-2.5 (3) MG/3ML SOLN USE 1 VIAL VIA NEBULIZER EVERY 6 HOURS AS NEEDED Patient taking differently:  Take 3 mLs by nebulization every 6 (six) hours as needed (for shortness of breath and wheezing). 09/18/22  Yes Kalman Shan, MD  traZODone (DESYREL) 100 MG tablet TAKE 2 TABLETS (200 MG TOTAL) BY MOUTH AT BEDTIME AS NEEDED FOR SLEEP 12/25/22  Yes Donita Brooks, MD  acetaminophen (TYLENOL) 500 MG tablet Take 1,000 mg by mouth every 6 (six) hours as needed for moderate pain. Patient not taking: Reported on 05/13/2023    [provider]  glucose blood (ONETOUCH ULTRA) test strip TEST ONCE DAILY E11.42 11/17/19   [provider]  nitroGLYCERIN (NITROSTAT) 0.4 MG SL tablet Place 0.4 mg under the tongue every 5 (five) minutes as needed for chest pain.    [provider]     Physical Exam: Vitals:   05/13/23 1610 05/13/23 0418 05/13/23 0438 05/13/23 0530  BP:  124/72    Pulse:  91    Resp:  14    Temp: 98.8 F (37.1 C) (!) 97.3 F (36.3 C)    TempSrc: Axillary Oral    SpO2:  95%  96%  Weight:   56.9 kg   Height:   5\' 11"  (1.803 m)     Physical Exam Constitutional:      General: He is in acute distress.     Appearance: He is ill-appearing.  Cardiovascular:     Rate and Rhythm: Tachycardia present.  Pulmonary:     Breath sounds: Rhonchi and rales present. No wheezing.  Abdominal:     General: Abdomen is scaphoid. Bowel sounds are decreased.     Palpations: Abdomen is soft.     Tenderness: There is no abdominal tenderness.  Skin:    General: Skin is warm.     Capillary Refill: Capillary refill takes less than 2 seconds.  Neurological:     Mental Status: He is oriented to person, place, and time.  Psychiatric:        Mood and Affect: Mood is anxious.      Labs on Admission: I have personally reviewed following labs and imaging studies  CBC: Recent Labs  Lab 05/12/23 2341 05/13/23 0341  WBC 17.3* 17.1*  NEUTROABS 12.8*  --   HGB 11.8* 11.2*  HCT 35.3* 34.2*  MCV  90.1 91.4  PLT 343 353   Basic Metabolic Panel: Recent Labs  Lab  05/12/23 2341 05/13/23 0341  NA 134* 135  K 4.0 4.6  CL 99 100  CO2 23 23  GLUCOSE 152* 161*  BUN 26* 26*  CREATININE 1.27* 1.18  CALCIUM 8.6* 8.1*   GFR: Estimated Creatinine Clearance: 44.9 mL/min (by C-G formula based on SCr of 1.18 mg/dL). Liver Function Tests: Recent Labs  Lab 05/12/23 2341 05/13/23 0341  AST 15 19  ALT 15 12  ALKPHOS 79 79  BILITOT 0.6 0.9  PROT 6.4* 6.0*  ALBUMIN 2.8* 2.5*   Recent Labs  Lab 05/12/23 2341  LIPASE 27   No results for input(s): "AMMONIA" in the last 168 hours. Coagulation Profile: No results for input(s): "INR", "PROTIME" in the last 168 hours. Cardiac Enzymes: Recent Labs  Lab 05/12/23 2341 05/13/23 0227  TROPONINIHS 6 7   BNP (last 3 results) No results for input(s): "BNP" in the last 8760 hours. HbA1C: No results for input(s): "HGBA1C" in the last 72 hours. CBG: No results for input(s): "GLUCAP" in the last 168 hours. Lipid Profile: No results for input(s): "CHOL", "HDL", "LDLCALC", "TRIG", "CHOLHDL", "LDLDIRECT" in the last 72 hours. Thyroid Function Tests: No results for input(s): "TSH", "T4TOTAL", "FREET4", "T3FREE", "THYROIDAB" in the last 72 hours. Anemia Panel: No results for input(s): "VITAMINB12", "FOLATE", "FERRITIN", "TIBC", "IRON", "RETICCTPCT" in the last 72 hours. Urine analysis:    Component Value Date/Time   COLORURINE YELLOW 03/20/2021 2045   APPEARANCEUR HAZY (A) 03/20/2021 2045   LABSPEC 1.013 03/20/2021 2045   PHURINE 5.0 03/20/2021 2045   GLUCOSEU NEGATIVE 03/20/2021 2045   HGBUR NEGATIVE 03/20/2021 2045   BILIRUBINUR NEGATIVE 03/20/2021 2045   KETONESUR NEGATIVE 03/20/2021 2045   PROTEINUR NEGATIVE 03/20/2021 2045   UROBILINOGEN 1.0 09/21/2012 1043   NITRITE NEGATIVE 03/20/2021 2045   LEUKOCYTESUR NEGATIVE 03/20/2021 2045    Radiological Exams on Admission: I have personally reviewed images CT ABDOMEN PELVIS W CONTRAST  Result Date: 05/13/2023 CLINICAL DATA:  Pulmonary embolism  suspected, high probability. Shortness of breath. History of lung cancer. Recurrent weight loss. EXAM: CT ANGIOGRAPHY CHEST CT ABDOMEN AND PELVIS WITH CONTRAST TECHNIQUE: Multidetector CT imaging of the chest was performed using the standard protocol during bolus administration of intravenous contrast. Multiplanar CT image reconstructions and MIPs were obtained to evaluate the vascular anatomy. Multidetector CT imaging of the abdomen and pelvis was performed using the standard protocol during bolus administration of intravenous contrast. RADIATION DOSE REDUCTION: This exam was performed according to the departmental dose-optimization program which includes automated exposure control, adjustment of the mA and/or kV according to patient size and/or use of iterative reconstruction technique. CONTRAST:  75mL OMNIPAQUE IOHEXOL 350 MG/ML SOLN COMPARISON:  03/12/2022, 09/24/2012. FINDINGS: CTA CHEST FINDINGS Cardiovascular: Heart is normal in size and there is no pericardial effusion. Multi-vessel coronary artery calcifications are noted. There is atherosclerotic calcification of the aorta without evidence of aneurysm. The pulmonary trunk is normal in caliber. No evidence of pulmonary embolism. Mediastinum/Nodes: No mediastinal, hilar, or axillary lymphadenopathy by size criteria. The thyroid gland, trachea, and esophagus are within normal limits. There is a small hiatal hernia. Lungs/Pleura: Surgical changes are noted in the right lung. There is bronchiectasis with bronchial wall thickening bilaterally and multifocal mucous plugging in the lower lobes bilaterally. Patchy airspace disease is present in the lungs bilaterally with scattered centrilobular nodules. Emphysematous changes are noted in the lungs. There is stable scarring in the left upper lobe. No effusion  or pneumothorax. Musculoskeletal: Degenerative changes are present in the thoracic spine. No acute or suspicious osseous abnormality. Review of the MIP images  confirms the above findings. CT ABDOMEN and PELVIS FINDINGS Hepatobiliary: No focal liver abnormality is seen. No gallstones, gallbladder wall thickening, or biliary dilatation. Pancreas: Unremarkable. No pancreatic ductal dilatation or surrounding inflammatory changes. Spleen: Normal in size without focal abnormality. Adrenals/Urinary Tract: The adrenal glands are within normal limits. The kidneys enhance symmetrically. Renal cysts are present bilaterally. Additional subcentimeter hypodensities are noted in the kidneys which are too small to further characterize. Calcifications are noted in the kidneys bilaterally, possible vascular calcifications versus renal calculi. No hydronephrosis. The bladder is unremarkable. Stomach/Bowel: There is thickening of the walls of the gastric rugae. Multiple loops of distended small bowel are noted in the abdomen with scattered air-fluid levels. There are bilateral inguinal hernias containing loops of small bowel, however a clear transition point is not well delineated. The distal small bowel is decompressed. No free air or pneumatosis. Appendix is not definitely seen. Evaluation of the bowel is limited due to lack oral contrast and paucity of intra-abdominal fat. Vascular/Lymphatic: Aortic atherosclerosis. No abdominal or pelvic lymphadenopathy. Reproductive: Prostate is unremarkable. Other: No abdominopelvic ascites. Musculoskeletal: Degenerative changes are present in the lumbar spine. No acute osseous abnormality Review of the MIP images confirms the above findings. IMPRESSION: 1. No evidence of pulmonary embolism. 2. Multiple dilated loops of small bowel in the abdomen. There are bilateral inguinal hernias containing small bowel, however an obstruction point is not well delineated due to lack of IV contrast and paucity of intra-abdominal fat. The distal small bowel is decompressed. Findings are concerning for small bowel obstruction. Surgical consultation is recommended. 3.  Bilateral bronchiectasis with bronchial wall thickening, multifocal mucous plugging, centrilobular nodules, and patchy airspace disease in the lungs bilaterally concerning for infection. Follow-up is recommended until resolution. 4. Emphysema. 5. Thickening of the walls of the gastric rugae suggesting gastritis. 6. Coronary artery calcifications and aortic atherosclerosis. 7. Remaining incidental findings as described above. Critical Value/emergent results were called by telephone at the time of interpretation on 05/13/2023 at 1:55 am to provider [facility] , who verbally acknowledged these results. Electronically Signed   By: Thornell Sartorius M.D.   On: 05/13/2023 01:55   CT Angio Chest PE W and/or Wo Contrast  Result Date: 05/13/2023 CLINICAL DATA:  Pulmonary embolism suspected, high probability. Shortness of breath. History of lung cancer. Recurrent weight loss. EXAM: CT ANGIOGRAPHY CHEST CT ABDOMEN AND PELVIS WITH CONTRAST TECHNIQUE: Multidetector CT imaging of the chest was performed using the standard protocol during bolus administration of intravenous contrast. Multiplanar CT image reconstructions and MIPs were obtained to evaluate the vascular anatomy. Multidetector CT imaging of the abdomen and pelvis was performed using the standard protocol during bolus administration of intravenous contrast. RADIATION DOSE REDUCTION: This exam was performed according to the departmental dose-optimization program which includes automated exposure control, adjustment of the mA and/or kV according to patient size and/or use of iterative reconstruction technique. CONTRAST:  75mL OMNIPAQUE IOHEXOL 350 MG/ML SOLN COMPARISON:  03/12/2022, 09/24/2012. FINDINGS: CTA CHEST FINDINGS Cardiovascular: Heart is normal in size and there is no pericardial effusion. Multi-vessel coronary artery calcifications are noted. There is atherosclerotic calcification of the aorta without evidence of aneurysm. The pulmonary trunk is normal in  caliber. No evidence of pulmonary embolism. Mediastinum/Nodes: No mediastinal, hilar, or axillary lymphadenopathy by size criteria. The thyroid gland, trachea, and esophagus are within normal limits. There is a small hiatal  hernia. Lungs/Pleura: Surgical changes are noted in the right lung. There is bronchiectasis with bronchial wall thickening bilaterally and multifocal mucous plugging in the lower lobes bilaterally. Patchy airspace disease is present in the lungs bilaterally with scattered centrilobular nodules. Emphysematous changes are noted in the lungs. There is stable scarring in the left upper lobe. No effusion or pneumothorax. Musculoskeletal: Degenerative changes are present in the thoracic spine. No acute or suspicious osseous abnormality. Review of the MIP images confirms the above findings. CT ABDOMEN and PELVIS FINDINGS Hepatobiliary: No focal liver abnormality is seen. No gallstones, gallbladder wall thickening, or biliary dilatation. Pancreas: Unremarkable. No pancreatic ductal dilatation or surrounding inflammatory changes. Spleen: Normal in size without focal abnormality. Adrenals/Urinary Tract: The adrenal glands are within normal limits. The kidneys enhance symmetrically. Renal cysts are present bilaterally. Additional subcentimeter hypodensities are noted in the kidneys which are too small to further characterize. Calcifications are noted in the kidneys bilaterally, possible vascular calcifications versus renal calculi. No hydronephrosis. The bladder is unremarkable. Stomach/Bowel: There is thickening of the walls of the gastric rugae. Multiple loops of distended small bowel are noted in the abdomen with scattered air-fluid levels. There are bilateral inguinal hernias containing loops of small bowel, however a clear transition point is not well delineated. The distal small bowel is decompressed. No free air or pneumatosis. Appendix is not definitely seen. Evaluation of the bowel is limited due  to lack oral contrast and paucity of intra-abdominal fat. Vascular/Lymphatic: Aortic atherosclerosis. No abdominal or pelvic lymphadenopathy. Reproductive: Prostate is unremarkable. Other: No abdominopelvic ascites. Musculoskeletal: Degenerative changes are present in the lumbar spine. No acute osseous abnormality Review of the MIP images confirms the above findings. IMPRESSION: 1. No evidence of pulmonary embolism. 2. Multiple dilated loops of small bowel in the abdomen. There are bilateral inguinal hernias containing small bowel, however an obstruction point is not well delineated due to lack of IV contrast and paucity of intra-abdominal fat. The distal small bowel is decompressed. Findings are concerning for small bowel obstruction. Surgical consultation is recommended. 3. Bilateral bronchiectasis with bronchial wall thickening, multifocal mucous plugging, centrilobular nodules, and patchy airspace disease in the lungs bilaterally concerning for infection. Follow-up is recommended until resolution. 4. Emphysema. 5. Thickening of the walls of the gastric rugae suggesting gastritis. 6. Coronary artery calcifications and aortic atherosclerosis. 7. Remaining incidental findings as described above. Critical Value/emergent results were called by telephone at the time of interpretation on 05/13/2023 at 1:55 am to provider [facility] , who verbally acknowledged these results. Electronically Signed   By: Thornell Sartorius M.D.   On: 05/13/2023 01:55   DG Chest 2 View  Result Date: 05/13/2023 CLINICAL DATA:  Shortness of breath EXAM: CHEST - 2 VIEW COMPARISON:  None Available. FINDINGS: Cardiomediastinal contours are normal. There is right parahilar scarring. Bibasilar atelectasis. No pleural effusion. Left apical opacity. Status post right upper lobectomy. IMPRESSION: 1. Left apical opacity, progressed from prior, favored to be scarring. 2. Right parahilar scarring and bibasilar atelectasis. Electronically Signed   By:  Deatra Robinson M.D.   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  Last bowel movement was on 05/11/2023. - CT abdomen pelvis showed multiple dilated loops of small bowel in the abdomen, bilateral inguinal hernia containing small bowel, however an obstruction point is not well delineated due to lack of IV contrast and paucity of intra-abdominal fat.  Distal small bowel obstruction and surgery consultation is recommended. - I have reached out to general surgery Dr. Ivar Drape as multiple attempt to placement of NG tube failed in the setting of SBO.  Per general surgery small bowel obstruction possibly related to an incarcerated right inguinal hernia which was reduced on exam.  As he did not tolerated NG tube will keep NPO.  Hopefully reducing his hernia will improve his symptoms.  If he does not improve general surgery will consider small bowel protocol without any NG tube for further evaluation and possible therapeutic effect. -Currently patient is NPO. - Appreciate general surgery input.  And will follow-up with the plan.   Sepsis Community-acquired pneumonia -And is tachycardic, has leukocytosis 17 and hypotensive on initial presentation.  Chest x-ray showed evidence of pneumonia.  Meets for sepsis. - Sepsis protocol has been utilized. - Giving LR 1 L  followed by continue LR 125 cc/h. -In the patient was ceftriaxone 2 g once and azithromycin 500 mg IV once. -Checking lactic acid -Checking respiratory panel, blood culture, sputum culture, urine Streptococcus pneumonia antigen, urine Legionella antigen. -Pending UA -Continue ceftriaxone 2 g/day and azithromycin 500 mg IV daily. -Follow-up with culture result for appropriate antibiotic guidance.   COPD exacerbation - COPD exacerbation in the setting of pneumonia - Patient reported increased shortness of breath, productive foul-smelling cough and low-grade fever at home. - Starting methylprednisone 125 mg for 5 days can transition to p.o. later. -Continue azithromycin for 5 days. -Continue scheduled DuoNeb every 6 hour, albuterol every 4 hours as needed for wheezing shortness of breath and Mucinex twice daily. -Continue supplemental oxygen to keep O2 sat above 92% - Continued on cough spirometry, flutter valve, chest PT -Solid with respiratory therapist   History of  lung cancer status post lobectomy History of squamous cell carcinoma of the lung -Stable.  Chest x-ray and chest CT did not show any new finding  History of DM type II -A1c 6.1 in December 2023.  Not on any home regimen - Currently patient is NPO. Rechecking A1c.  CKD stage IIIb -Creatinine 1.2 on presentation (baseline creatinine around 1.1-1.2) not concerned about AKI at this time. - Baseline 35-50. -Continue to monitor renal function - Monitor urine output. - Avoid nephrotoxic agent.  History of CAD -At home patient is on Lipitor 20 mg daily and Cardizem 120 mg daily. -In the setting of high potential holding home blood pressure regimen.  Essential hypertension -At home on Cardizem 120 mg daily.  Holding in the setting of hypotension  DVT prophylaxis: SCDs.  Holding Lovenox or heparin for DVT prophylaxis for possible surgical intervention Code Status: Full Code Diet: Currently n.p.o. Family Communication:  none  Disposition Plan: Tentative discharge to home in 3 to 4 days. Consults: General surgery Admission status: Inpatient, Telemetry bed  Severity of Illness: The appropriate patient status for this patient is INPATIENT. Inpatient status is judged to be reasonable  and necessary in order to provide the required intensity of service to ensure the patient's safety. The patient's presenting symptoms, physical exam findings, and initial radiographic and laboratory data in the context of their chronic comorbidities is felt to place them at high risk for further clinical deterioration. Furthermore, it is not anticipated that the patient will be medically stable for discharge from the hospital within 2 midnights of admission.   * I certify that at the point of admission it is my clinical judgment that the patient will require inpatient hospital care spanning beyond 2 midnights from the point of admission due to high intensity of service, high risk for further deterioration and high frequency of surveillance required.Marland Kitchen    Tereasa Coop MD [facility]  How to contact the [facility] Attending or Consulting provider 7A - 7P or covering provider during after hours 7P -7A, for this patient?   Check the care team in [facility] and look for a) attending/consulting TRH provider listed and b) the [facility] team listed Log into [email or web address] and use [place] universal password to access. If you do not have the password, please contact the hospital operator. Locate the [facility] provider you are looking for under [facility] and page to a number that you can be directly reached. If you still have difficulty reaching the provider, please page the [facility] (Director on Call) for the Hospitalists listed on amion for assistance.  05/13/2023, 6:53 AM

## 2023-05-13 NOTE — Progress Notes (Signed)
Progress Note   Patient: [name] DOB: November 06, 1950 DOA: 05/12/2023     0 DOS: the patient was seen and examined on 05/13/2023   Brief hospital course: 72 year old man medical history of CAD status post remote right RCA stent placement, lung cancer status post partial right lung resection, hypertension, COPD, OSA, diabetes mellitus type 2 presented to emergency department with abdominal pain, nausea vomiting since this morning 05/12/2023.  Patient reported he has last bowel movement on Friday, 05/11/2023.  Patient is also complaining of increased productive cough, shortness of breath and breathing of foul-smelling sputum.  Patient is currently on antibiotic therapy for pneumonia on 3 L of oxygen via nasal cannula.  General surgeon evaluated the patient and reduced the incarcerated right inguinal hernia GI symptoms are significantly improved.  Surgical clearance to initiate the patient on clear liquid diet  Assessment and Plan: BO Right inguinal hernia -Patient reported nausea, vomiting abdominal pain for last 24 hours.  Last bowel movement was on 05/11/2023. - CT abdomen pelvis showed multiple dilated loops of small bowel in the abdomen, bilateral inguinal hernia containing small bowel, however an obstruction point is not well delineated due to lack of IV contrast and paucity of intra-abdominal fat.  Distal small bowel obstruction and surgery consultation is recommended. -  Dr. Ivar Drape evaluated the patient and reduced the right inguinal incarcerated hernia.  Patient is feeling much better.  Awaiting clearance from general surgery to initiate the patient on clear liquid diet. -Currently patient is NPO.      Sepsis Community-acquired pneumonia -And is tachycardic, has leukocytosis 17 and hypotensive on initial presentation.  Chest x-ray showed evidence of pneumonia.  Meets for sepsis. - Sepsis protocol has been utilized. - Continue ceftriaxone 2 g once and azithromycin 500  mg IV  -Continue supportive care - Respiratory viral panel including COVID-negative streptococcal pneumonia urinary antigen negative -Follow-up with culture result for appropriate antibiotic guidance.     COPD exacerbation - COPD exacerbation in the setting of pneumonia - Patient reported increased shortness of breath, productive foul-smelling cough and low-grade fever at home. - Starting methylprednisone 125 mg for 5 days can transition to p.o. later. -Continue azithromycin for 5 days. -Continue scheduled DuoNeb every 6 hour, albuterol every 4 hours as needed for wheezing shortness of breath and Mucinex twice daily. -Continue supplemental oxygen to keep O2 sat above 92% - Continued on cough spirometry, flutter valve, chest PT -Solid with respiratory therapist     History of  lung cancer status post lobectomy History of squamous cell carcinoma of the lung -Stable.  Chest x-ray and chest CT did not show any new finding   History of DM type II -A1c 6.1 in December 2023.  Not on any home regimen - Currently patient is NPO. Rechecking A1c.Pending   CKD stage IIIb -Creatinine 1.2 on presentation (baseline creatinine around 1.1-1.2) not concerned about AKI at this time. - Baseline 35-50. -Continue to monitor renal function - Monitor urine output. - Avoid nephrotoxic agent.   History of CAD -At home patient is on Lipitor 20 mg daily and Cardizem 120 mg daily. -In the setting of high potential holding home blood pressure regimen.   Essential hypertension -At home on Cardizem 120 mg daily.  Holding in the setting of hypotension   DVT prophylaxis: SCDs.  Holding Lovenox or heparin for DVT prophylaxis for possible surgical intervention      Subjective: Patient was seen this morning he was asleep.  Subsequently he was seen to  be participating with physical therapy and states to be doing much better.  Denies having any nausea vomiting abdominal pain.  Physical Exam: Vitals:    05/13/23 0832 05/13/23 0900 05/13/23 1000 05/13/23 1224  BP: 129/66     Pulse: 100     Resp: 19     Temp: 98.2 F (36.8 C)     TempSrc:      SpO2: 97% 95% 96% 91%  Weight:      Height:       Constitutional:      General: Not in any acute distress    Appearance: Cachectic and fatigued Cardiovascular:     Rate and Rhythm: Tachycardia present.  Pulmonary:     Breath sounds: Rhonchi and rales present. No wheezing.  Abdominal:     General: Abdomen is scaphoid. Bowel sounds are sluggish    Palpations: Abdomen is soft.     Tenderness: There is no abdominal tenderness.  Skin:    General: Skin is warm.     Capillary Refill: Capillary refill takes less than 2 seconds.  Neurological:     Mental Status: He is oriented to person, place, and time.  Psychiatric:        Mood and Affect: Mood is anxious. Data Reviewed:  CBC BMP  Family Communication: No family at bedside  Disposition: Status is: Inpatient Remains inpatient appropriate because: Immunity acquired pneumonia, incarcerated inguinal hernia  Planned Discharge Destination: Home    Time spent: 35 minutes  Author: Harold Hedge, MD 05/13/2023 1:08 PM  For on call review [email or web address].

## 2023-05-13 NOTE — Plan of Care (Signed)
Patient arrived from ED this morning asleep (had received IV ativan about 40 minutes prior to transfer). Arrived on 3L O2 but writer discovered that patient was mostly mouth breathing, so bumped up to 4L and mask applied until awake. Satting in mid 90s. Code sepsis activated at change of shift. Resp pcr, droplet precautions, LR bolus started and first lactate drawn. Patient more alert now but very hoarse when attempting to speak. Blood cultures and IV antibiotics started before transfer to unit.  Problem: Education: Goal: Knowledge of General Education information will improve Description: Including pain rating scale, medication(s)/side effects and non-pharmacologic comfort measures Outcome: Not Progressing   Problem: Health Behavior/Discharge Planning: Goal: Ability to manage health-related needs will improve Outcome: Not Progressing   Problem: Clinical Measurements: Goal: Ability to maintain clinical measurements within normal limits will improve Outcome: Not Progressing Goal: Will remain free from infection Outcome: Not Progressing Goal: Diagnostic test results will improve Outcome: Not Progressing Goal: Respiratory complications will improve Outcome: Not Progressing Goal: Cardiovascular complication will be avoided Outcome: Not Progressing   Problem: Activity: Goal: Risk for activity intolerance will decrease Outcome: Not Progressing   Problem: Nutrition: Goal: Adequate nutrition will be maintained Outcome: Not Progressing   Problem: Coping: Goal: Level of anxiety will decrease Outcome: Not Progressing   Problem: Elimination: Goal: Will not experience complications related to bowel motility Outcome: Not Progressing Goal: Will not experience complications related to urinary retention Outcome: Not Progressing   Problem: Pain Managment: Goal: General experience of comfort will improve Outcome: Not Progressing   Problem: Safety: Goal: Ability to remain free from injury  will improve Outcome: Not Progressing   Problem: Skin Integrity: Goal: Risk for impaired skin integrity will decrease Outcome: Not Progressing   Problem: Fluid Volume: Goal: Hemodynamic stability will improve Outcome: Not Progressing   Problem: Clinical Measurements: Goal: Diagnostic test results will improve Outcome: Not Progressing Goal: Signs and symptoms of infection will decrease Outcome: Not Progressing   Problem: Respiratory: Goal: Ability to maintain adequate ventilation will improve Outcome: Not Progressing

## 2023-05-14 ENCOUNTER — Telehealth: Payer: Self-pay

## 2023-05-14 LAB — COMPREHENSIVE METABOLIC PANEL
ALT: 14 U/L (ref 0–44)
AST: 11 U/L — ABNORMAL LOW (ref 15–41)
Albumin: 2.3 g/dL — ABNORMAL LOW (ref 3.5–5.0)
Alkaline Phosphatase: 65 U/L (ref 38–126)
Anion gap: 9 (ref 5–15)
BUN: 16 mg/dL (ref 8–23)
CO2: 26 mmol/L (ref 22–32)
Calcium: 8.2 mg/dL — ABNORMAL LOW (ref 8.9–10.3)
Chloride: 100 mmol/L (ref 98–111)
Creatinine, Ser: 0.94 mg/dL (ref 0.61–1.24)
GFR, Estimated: >60 mL/min (ref >60)
Glucose, Bld: 85 mg/dL (ref 70–99)
Potassium: 4 mmol/L (ref 3.5–5.1)
Sodium: 135 mmol/L (ref 135–145)
Total Bilirubin: 0.3 mg/dL (ref 0.3–1.2)
Total Protein: 5.2 g/dL — ABNORMAL LOW (ref 6.5–8.1)

## 2023-05-14 LAB — BLOOD CULTURE ID PANEL (REFLEXED) - BCID2

## 2023-05-14 LAB — CBC WITH DIFFERENTIAL/PLATELET
Abs Immature Granulocytes: 0.1 10*3/uL — ABNORMAL HIGH (ref 0.00–0.07)
Basophils Absolute: 0 10*3/uL (ref 0.0–0.1)
Basophils Relative: 0 %
Eosinophils Absolute: 0 10*3/uL (ref 0.0–0.5)
Eosinophils Relative: 0 %
HCT: 30.3 % — ABNORMAL LOW (ref 39.0–52.0)
Hemoglobin: 10 g/dL — ABNORMAL LOW (ref 13.0–17.0)
Immature Granulocytes: 1 %
Lymphocytes Relative: 21 %
Lymphs Abs: 4 10*3/uL (ref 0.7–4.0)
MCH: 30 pg (ref 26.0–34.0)
MCHC: 33 g/dL (ref 30.0–36.0)
MCV: 91 fL (ref 80.0–100.0)
Monocytes Absolute: 1.4 10*3/uL — ABNORMAL HIGH (ref 0.1–1.0)
Monocytes Relative: 7 %
Neutro Abs: 13.8 10*3/uL — ABNORMAL HIGH (ref 1.7–7.7)
Neutrophils Relative %: 71 %
Platelets: 310 10*3/uL (ref 150–400)
RBC: 3.33 MIL/uL — ABNORMAL LOW (ref 4.22–5.81)
RDW: 14.2 % (ref 11.5–15.5)
WBC: 19.3 10*3/uL — ABNORMAL HIGH (ref 4.0–10.5)
nRBC: 0 % (ref 0.0–0.2)

## 2023-05-14 LAB — EXPECTORATED SPUTUM ASSESSMENT W GRAM STAIN, RFLX TO RESP C

## 2023-05-14 LAB — CULTURE, RESPIRATORY W GRAM STAIN: Special Requests: NORMAL

## 2023-05-14 MED ORDER — FENTANYL CITRATE PF 50 MCG/ML IJ SOSY: 25.0000 ug | PREFILLED_SYRINGE | INTRAMUSCULAR | Status: DC | PRN

## 2023-05-14 NOTE — Progress Notes (Signed)
Patient complained of overall generalized body pain 10/10, stating that it is chronic and tylenol does not work. [name], DO was notified. Patient informed nurse that he is "done fighting" his illness and going through "this problem", "I just want to go right now". Patient also stated that he does now want compression to be perform if his heart stops, he just want [place] to "let him go", "I don't want anything done". [name], DO was notified of patient's wish via secure chat.   [name]

## 2023-05-14 NOTE — Significant Event (Signed)
TRH night coverage note:  Per RN: "Good morning, 1O10 [name]. He is requesting pain med, refused his tylenol. Complained of overall body pain. HX of COPD, lung cancer, OSA. Patient wish to be DRN. stated that he is done fighting and that he just wants to go right now and if his heart stops, he doesn't want [place] to do anything. I told him that I would let the Mds know. Thank you."  Seems consistent with patient prior wishes: Looks like he was DNR during last admit in 2022 as well.  1) Will make pt DNR at his request. 2) Will order PRN fentanyl for pain

## 2023-05-14 NOTE — Telephone Encounter (Signed)
Spoke with pt's wife, Pandora Leiter, because pt is sleeping,  who advises that pt did leave AMA at 5 this morning and is refusing to go back to the hospital. Mrs. Lehrmann states pt became irate with hospital staff and was combative. Advised Mrs. Daman of patient's blood culture results and she states she will advise the patient but she doesn't think she can get him to go back to the hospital. I offered to call pt back to see if I can convince him to go to the hospital and Mrs. Marucci said she didn't think it would work.  I am not sure what to do at this point. Thanks.

## 2023-05-14 NOTE — Telephone Encounter (Signed)
[facility] called and pt has blood cultures drawn, container 1 out of 4 grew streptococcus species.  Pt was seen in ER on 05/12/2023, was admitted but left AMA on 05/14/2023.  Dx with Small bowel obstruction. Thanks.

## 2023-05-14 NOTE — Progress Notes (Addendum)
[facility], Patient's spouse was called and notified of patient leaving AMA.  Spouse states that what RN described of the patient's behavior are "normal behavior" for patient, that he hates the hospital and have threaten to leave AMA before.  Esteen Delpriore

## 2023-05-14 NOTE — Progress Notes (Signed)
Patient ambulated to the bathroom and had  BM. Per patient it was a "big hard one". Unable to assess BM due to patient already flushing.  [name]

## 2023-05-14 NOTE — Progress Notes (Signed)
Patient was agitated but oriented x4. Patient verbally and physically threaten staffs. RN and CNA Anaya attempted to deescalate the situation by assessing patient's needs and patient refused. Patient wanted to leave AMA, refused to sign paperwork, and refused any assistance from staffs. Molly Maduro, RN charge nurse and [name], DO were both notified.   IV was removed by Morrie Sheldon, RN.    [name]

## 2023-05-14 NOTE — Significant Event (Signed)
TRH night coverage note:  Informed by RN that pt leaving AMA: "2W26 [name] wants to leave AMA and patient is refusing to sign paperwork. Patient verbally and physically threatening staff, raising his hands. Attempted to calm patient down and assess what he needs but patient is refusing everything."  RN has informed me that pt is AAOx3 and seems to have decision making capacity: "I had my charge talk to him also. he is oriented"  I asked if there was anything I could do or order to make him stay.  RN replied that he had just left the unit / hospital AMA.

## 2023-05-15 ENCOUNTER — Telehealth: Payer: Self-pay

## 2023-05-15 ENCOUNTER — Ambulatory Visit: Payer: Self-pay

## 2023-05-15 LAB — LEGIONELLA PNEUMOPHILA SEROGP 1 UR AG: L. pneumophila Serogp 1 Ur Ag: NEGATIVE

## 2023-05-15 LAB — CULTURE, RESPIRATORY W GRAM STAIN

## 2023-05-15 NOTE — Transitions of Care (Post Inpatient/ED Visit) (Signed)
05/15/2023  Name: [name] MRN: [id number] DOB: [birth year]  Today's TOC FU Call Status: Today's TOC FU Call Status:: Successful TOC FU Call Competed TOC FU Call Complete Date: 05/15/23  Transition Care Management Follow-up Telephone Call Date of Discharge: 05/14/23 (Patient left AMA) Discharge Facility: [facility]) Type of Discharge: Inpatient Admission Primary Inpatient Discharge Diagnosis:: Pneumonia How have you been since you were released from the hospital?: Better ("I am a little better") Any questions or concerns?: No  Items Reviewed: Did you receive and understand the discharge instructions provided?: No (Patient left AMA, did not get dc instructions) Medications obtained,verified, and reconciled?: Yes (Medications Reviewed) Any new allergies since your discharge?: No Dietary orders reviewed?: No Do you have support at home?: Yes People in Home: spouse Name of Support/Comfort Primary Source: MaryLou  Medications Reviewed Today: Medications Reviewed Today     Reviewed by Jodelle Gross, RN (Case Manager) on 05/15/23 at 1546  Med List Status: <None>   Medication Order Taking? Sig Documenting Provider Last Dose Status Informant  acetaminophen (TYLENOL) 500 MG tablet [id number] Yes Take 1,000 mg by mouth every 6 (six) hours as needed for moderate pain. [provider] Taking Active Self  albuterol (PROVENTIL) (2.5 MG/3ML) 0.083% nebulizer solution 2.5 mg [id number]   Danelle Berry, PA-C  Active   atorvastatin (LIPITOR) 20 MG tablet [id number] Yes Take 1 tablet (20 mg total) by mouth daily. Jake Bathe, MD Taking Active Self, Pharmacy Records  Budeson-Glycopyrrol-Formoterol [facility] AEROSPHERE) 160-9-4.8 MCG/ACT AERO [id number] Yes Inhale into the lungs. 2 puffs in the morning and 2 puffs at night [provider] Taking Active Self           Med Note (WHITE, [place] [place] May 13, 2023  2:13 AM) Given a sample and at the bedside  diltiazem  (CARDIZEM CD) 120 MG 24 hr capsule [id number] Yes Take 1 capsule (120 mg total) by mouth daily. Jake Bathe, MD Taking Active Self, Pharmacy Records  famotidine (PEPCID) 10 MG tablet [id number] Yes Take 10 mg by mouth daily as needed for heartburn. [provider] Taking Active Self  FLUoxetine (PROZAC) 40 MG capsule [id number] Yes TAKE 1 CAPSULE BY MOUTH EVERY DAY  Patient taking differently: Take 40 mg by mouth daily.   Donita Brooks, MD Taking Active Self, Pharmacy Records  gabapentin (NEURONTIN) 300 MG capsule [id number] Yes TAKE 1 CAPSULE BY MOUTH THREE TIMES A DAY  Patient taking differently: Take 300 mg by mouth 3 (three) times daily.   Donita Brooks, MD Taking Active Self, Pharmacy Records  glucose blood [facility] ULTRA) test strip [id number]  TEST ONCE DAILY E11.42 [provider]  Active Self  ipratropium-albuterol (DUONEB) 0.5-2.5 (3) MG/3ML SOLN [id number] Yes USE 1 VIAL VIA NEBULIZER EVERY 6 HOURS AS NEEDED  Patient taking differently: Take 3 mLs by nebulization every 6 (six) hours as needed (for shortness of breath and wheezing).   Kalman Shan, MD Taking Active Self, Pharmacy Records  nitroGLYCERIN (NITROSTAT) 0.4 MG SL tablet [id number]  Place 0.4 mg under the tongue every 5 (five) minutes as needed for chest pain. [provider]  Active Self           Med Note Rinaldo Ratel, APRIL   Fri Oct 09, 2019 10:28 AM)    traZODone (DESYREL) 100 MG tablet [id number] Yes TAKE 2 TABLETS (200 MG TOTAL) BY MOUTH AT BEDTIME AS NEEDED FOR SLEEP Donita Brooks, MD Taking Active Self, Pharmacy Records  Med Note (WHITE, Elvin So   Mon May 13, 2023  2:25 AM) Not sure of how long its been, since he has had difficulty swallowing            Home Care and Equipment/Supplies: Were Home Health Services Ordered?: No Any new equipment or medical supplies ordered?: No  Functional Questionnaire: Do you need assistance with bathing/showering or dressing?:  Yes Do you need assistance with meal preparation?: Yes Do you need assistance with eating?: No Do you have difficulty maintaining continence: No Do you need assistance with getting out of bed/getting out of a chair/moving?: No Do you have difficulty managing or taking your medications?: No  Follow up appointments reviewed: PCP Follow-up appointment confirmed?: Yes Date of PCP follow-up appointment?: 05/17/23 Follow-up Provider: Dr. Tanya Nones Specialist [facility] Follow-up appointment confirmed?: NA Do you need transportation to your follow-up appointment?: No Do you understand care options if your condition(s) worsen?: Yes-patient verbalized understanding  SDOH Interventions Today    Flowsheet Row Most Recent Value  SDOH Interventions   Food Insecurity Interventions Intervention Not Indicated  Transportation Interventions Intervention Not Indicated      Interventions Today    Flowsheet Row Most Recent Value  Chronic Disease   Chronic disease during today's visit Other  [Pneumonia]  General Interventions   General Interventions Discussed/Reviewed General Interventions Discussed, Communication with  Communication with PCP/Specialists  [Notified of need for Breztri refill]       TOC Interventions Today    Flowsheet Row Most Recent Value  TOC Interventions   TOC Interventions Discussed/Reviewed TOC Interventions Discussed       Jodelle Gross, RN, BSN, CCM Care Management Coordinator [place] Phone: [phone]/Fax: [phone]

## 2023-05-15 NOTE — Chronic Care Management (AMB) (Signed)
   05/15/2023  [name] February 07, 1951 [id number]   Reason for Encounter: Patient is not currently enrolled in the CCM program. CCM status changed to previously enrolled  Alto Denver RN, MSN, CCM RN Care Manager  Chronic Care Management Direct Number: [phone]

## 2023-05-16 ENCOUNTER — Telehealth: Payer: Self-pay

## 2023-05-16 NOTE — Patient Outreach (Signed)
  Care Coordination   Note   05/16/2023 Name: [name] MRN: [id number] DOB: [birth year]  This write reached out to patient's spouse, MaryLou, after our long conversation yesterday and response from Dr. Tanya Nones.  Per LouAnn, her husband knows he has a blood infection and it is extremely serious.  He had radiation in the past that has lead to him having a poor quality of life and he does not wish to pursue aggressive  treatment in the hospital for this infection.  He continues to refuse to go to the hospital and he will discuss Hospice with Dr. Tanya Nones at his visit tomorrow.  Patient was on Hospice in May of 22 and was discharged after 3 months when he improved. Patient has not filled out a MOST form and spouse noted they needed to get that filled out.  Jodelle Gross, RN, BSN, CCM Care Management Coordinator [place] Phone: [phone]/Fax: [phone]

## 2023-05-17 ENCOUNTER — Encounter: Payer: Self-pay | Admitting: Family Medicine

## 2023-05-17 ENCOUNTER — Ambulatory Visit (INDEPENDENT_AMBULATORY_CARE_PROVIDER_SITE_OTHER): Payer: PPO | Admitting: Family Medicine

## 2023-05-17 VITALS — BP 112/52 | HR 74 | Temp 97.5°F | Ht 71.0 in | Wt 132.0 lb

## 2023-05-17 DIAGNOSIS — D72829 Elevated white blood cell count, unspecified: Secondary | ICD-10-CM

## 2023-05-17 DIAGNOSIS — K56609 Unspecified intestinal obstruction, unspecified as to partial versus complete obstruction: Secondary | ICD-10-CM | POA: Diagnosis not present

## 2023-05-17 DIAGNOSIS — R7881 Bacteremia: Secondary | ICD-10-CM | POA: Diagnosis not present

## 2023-05-17 LAB — CBC WITH DIFFERENTIAL/PLATELET
Absolute Monocytes: 1292 cells/uL — ABNORMAL HIGH (ref 200–950)
Basophils Relative: 0.3 %
Eosinophils Absolute: 369 cells/uL (ref 15–500)
Eosinophils Relative: 1.8 %
HCT: 36.2 % — ABNORMAL LOW (ref 38.5–50.0)
Hemoglobin: 11.8 g/dL — ABNORMAL LOW (ref 13.2–17.1)
MCH: 30 pg (ref 27.0–33.0)
MCHC: 32.6 g/dL (ref 32.0–36.0)
MPV: 10.2 fL (ref 7.5–12.5)
Neutrophils Relative %: 65.8 %
RDW: 13.1 % (ref 11.0–15.0)
Total Lymphocyte: 25.8 %

## 2023-05-17 LAB — CULTURE, BLOOD (ROUTINE X 2)

## 2023-05-17 MED ORDER — AMOXICILLIN-POT CLAVULANATE 875-125 MG PO TABS: 1.0000 | ORAL_TABLET | Freq: Two times a day (BID) | ORAL | 0 refills | Status: DC

## 2023-05-17 NOTE — Progress Notes (Signed)
Subjective:    Patient ID: [name], male    DOB: Apr 18, 1951, 72 y.o.   MRN: [id number]  Admitted 05/13/23 with SBO, sepsis, and CAP.  See H/P below for reference: "Assessment and Plan: SBO Right inguinal hernia -Patient reported nausea, vomiting abdominal pain for last 24 hours.  Last bowel movement was on 05/11/2023. - CT abdomen pelvis showed multiple dilated loops of small bowel in the abdomen, bilateral inguinal hernia containing small bowel, however an obstruction point is not well delineated due to lack of IV contrast and paucity of intra-abdominal fat.  Distal small bowel obstruction and surgery consultation is recommended. - I have reached out to general surgery Dr. Ivar Drape as multiple attempt to placement of NG tube failed in the setting of SBO.  Per general surgery small bowel obstruction possibly related to an incarcerated right inguinal hernia which was reduced on exam.  As he did not tolerated NG tube will keep NPO.  Hopefully reducing his hernia will improve his symptoms.  If he does not improve general surgery will consider small bowel protocol without any NG tube for further evaluation and possible therapeutic effect. -Currently patient is NPO. - Appreciate general surgery input.  And will follow-up with the plan. Sepsis Community-acquired pneumonia -And is tachycardic, has leukocytosis 17 and hypotensive on initial presentation.  Chest x-ray showed evidence of pneumonia.  Meets for sepsis. - Sepsis protocol has been utilized. - Giving LR 1 L followed by continue LR 125 cc/h. -In the patient was ceftriaxone 2 g once and azithromycin 500 mg IV once. -Checking lactic acid -Checking respiratory panel, blood culture, sputum culture, urine Streptococcus pneumonia antigen, urine Legionella antigen. -Pending UA -Continue ceftriaxone 2 g/day and azithromycin 500 mg IV daily. -Follow-up with culture result for appropriate antibiotic guidance. COPD exacerbation -  COPD exacerbation in the setting of pneumonia - Patient reported increased shortness of breath, productive foul-smelling cough and low-grade fever at home. - Starting methylprednisone 125 mg for 5 days can transition to p.o. later. -Continue azithromycin for 5 days. -Continue scheduled DuoNeb every 6 hour, albuterol every 4 hours as needed for wheezing shortness of breath and Mucinex twice daily. -Continue supplemental oxygen to keep O2 sat above 92% - Continued on cough spirometry, flutter valve, chest PT -Solid with respiratory therapist"  05/17/23 Patient left AMA on 7/9.  Blood cultures grew Strep Sanguinis 1/2 but patient refused to return to the hospital again [facility]. See documentation in chart.   Miraculously, he is doing quite well today.  He denies any abdominal pain.  He is not vomiting.  He has bowel sounds in all 4 quadrants.  His small dressing to resuscitate.  Although he may have bacteremia, he denies any fever.  He denies any night sweats.  He is not on any antibiotics.  I question if this may have been contamination. Past Medical History:  Diagnosis Date   Allergy    Anemia    Anginal pain (HCC)    Arthritis    "hands"   Asthma    Back pain    4 deteriorating disc and receives an injection q3-67mon;has been doing this for about 55yrs   Blood transfusion    as a child   Bronchitis    Cancer (HCC) dx'd 2013   Non-small cell lung cancer   Chronic kidney disease    acute kidney failure post surgery   COPD (chronic obstructive pulmonary disease) (HCC)    uses Albuterol and Spiriva daily   Coronary  artery disease    has 1 stent   Depression    takes Prozac daily   Emphysema    sees Dr.Ramaswami for this   Family history of adverse reaction to anesthesia    mother was hard to wake up sometimes   GERD (gastroesophageal reflux disease)    takes Prilosec daily, EGD nml 06/2017   Glaucoma    hx of   H/O hiatal hernia    Hypertension    takes  HYzaar daily   Insomnia     takes Trazodone nightly   Lung mass    right upper lobe   Myocardial infarct [facility])    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   Obesity    Parathyroid disease (HCC)    Periodic limb movements of sleep    Peripheral neuropathy    Pneumonia    hx of' 72 yo,rd' last time in 2013   Productive cough    white in color but no odor   Shortness of breath    with exertion    Sleep apnea    uses BiPaP; "no longer have sleep apnea since I have lost over 100 lbs"   Syncope and collapse 05/26/2013   Thyroid disease    Type II diabetes mellitus (HCC)    takes Metformin bid and Novolog and Lantus daily   Past Surgical History:  Procedure Laterality Date   CARDIAC CATHETERIZATION  2006/2008/2009   CARPAL TUNNEL RELEASE     bilateral   COLONOSCOPY     CORONARY ANGIOPLASTY WITH STENT PLACEMENT     1 stent   ELBOW SURGERY     ulnar nerve; left   ESOPHAGOGASTRODUODENOSCOPY     EYE SURGERY     pt. denies   FUDUCIAL PLACEMENT Left 08/24/2016   Procedure: PLACEMENT OF FUDUCIAL;  Surgeon: Loreli Slot, MD;  Location[facility];  Service: Thoracic;  Laterality: Left;   INGUINAL HERNIA REPAIR  1990's   double inguinal   LOBECTOMY  03/17/2012   upper right side with resection   MICROLARYNGOSCOPY WITH CO2 LASER AND EXCISION OF VOCAL CORD LESION N/A 06/28/2017   Procedure: MICROLARYNGOSCOPY WITH BIOPSY;  Surgeon: Christia Reading, MD;  Location: [facility] OR;  Service: ENT;  Laterality: N/A;   MICROLARYNGOSCOPY WITH CO2 LASER AND EXCISION OF VOCAL CORD LESION N/A 07/19/2017   Procedure: MICROLARYNGOSCOPY WITH CO2 LASER AND EXCISION OF VOCAL CORD LESION;  Surgeon: Christia Reading, MD;  Location: [facility] OR;  Service: ENT;  Laterality: N/A;  MICRO DIRECT LARYNGOSCOPY WITH CO2 LASER VOCAL CORD STRIPPING/POSS JET VENTURI VENTILATION   REFRACTIVE SURGERY     bilaterally   ROTATOR CUFF REPAIR     left   VIDEO BRONCHOSCOPY WITH ENDOBRONCHIAL NAVIGATION N/A 08/24/2016   Procedure: VIDEO  BRONCHOSCOPY WITH ENDOBRONCHIAL NAVIGATION;  Surgeon: Loreli Slot, MD;  Location: [facility] OR;  Service: Thoracic;  Laterality: N/A;   Current Outpatient Medications on File Prior to Visit  Medication Sig Dispense Refill   acetaminophen (TYLENOL) 500 MG tablet Take 1,000 mg by mouth every 6 (six) hours as needed for moderate pain.     atorvastatin (LIPITOR) 20 MG tablet Take 1 tablet (20 mg total) by mouth daily. 90 tablet 3   Budeson-Glycopyrrol-Formoterol (BREZTRI AEROSPHERE) 160-9-4.8 MCG/ACT AERO Inhale into the lungs. 2 puffs in the morning and 2 puffs at night     diltiazem (CARDIZEM CD) 120 MG 24 hr capsule Take 1 capsule (120 mg total) by mouth daily. 90  capsule 1   famotidine (PEPCID) 10 MG tablet Take 10 mg by mouth daily as needed for heartburn.     FLUoxetine (PROZAC) 40 MG capsule TAKE 1 CAPSULE BY MOUTH EVERY DAY (Patient taking differently: Take 40 mg by mouth daily.) 90 capsule 2   gabapentin (NEURONTIN) 300 MG capsule TAKE 1 CAPSULE BY MOUTH THREE TIMES A DAY (Patient taking differently: Take 300 mg by mouth 3 (three) times daily.) 270 capsule 2   glucose blood (ONETOUCH ULTRA) test strip TEST ONCE DAILY E11.42     ipratropium-albuterol (DUONEB) 0.5-2.5 (3) MG/3ML SOLN USE 1 VIAL VIA NEBULIZER EVERY 6 HOURS AS NEEDED (Patient taking differently: Take 3 mLs by nebulization every 6 (six) hours as needed (for shortness of breath and wheezing).) 360 mL 11   nitroGLYCERIN (NITROSTAT) 0.4 MG SL tablet Place 0.4 mg under the tongue every 5 (five) minutes as needed for chest pain.     traZODone (DESYREL) 100 MG tablet TAKE 2 TABLETS (200 MG TOTAL) BY MOUTH AT BEDTIME AS NEEDED FOR SLEEP 180 tablet 3   Current Facility-Administered Medications on File Prior to Visit  Medication Dose Route Frequency Provider Last Rate Last Admin   albuterol (PROVENTIL) (2.5 MG/3ML) 0.083% nebulizer solution 2.5 mg  2.5 mg Nebulization Once Danelle Berry, PA-C       Allergies  Allergen Reactions   Actos  [Pioglitazone] Swelling    EDEMA REACTION UNSPECIFIED   Avandia [Rosiglitazone] Swelling    SWELLING REACTION UNSPECIFIED    Dilaudid [Hydromorphone Hcl] Other (See Comments)    Made him crazy Hallucinations    Social History   Socioeconomic History   Marital status: Married    Spouse name: mary lou   Number of children: 3   Years of education: trade    Highest education level: Not on file  Occupational History   Occupation: truck Hospital doctor    Comment: retired  Tobacco Use   Smoking status: Every Day    Current packs/day: 3.00    Average packs/day: 3.0 packs/day for 61.5 years (184.6 ttl pk-yrs)    Types: Cigarettes    Start date: 1963   Smokeless tobacco: Never   Tobacco comments:    1ppd as of 01/04/21  ep  Vaping Use   Vaping status: Former  Substance and Sexual Activity   Alcohol use: No    Comment: no alcolol since 2013   Drug use: Yes    Types: Cocaine, Marijuana    Comment: quit 1990's   Sexual activity: Not Currently  Other Topics Concern   Not on file  Social History Narrative   Lives in [place] with wife   She is his NOK   Was a truck driver for 37 yr, 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   Social Determinants of Health   Financial Resource Strain: Low Risk  (05/14/2023)   Overall Financial Resource Strain (CARDIA)    Difficulty of Paying Living Expenses: Not hard at all  Food Insecurity: No Food Insecurity (05/15/2023)   Hunger Vital Sign    Worried About Running Out of Food in the Last Year: Never true    Ran Out of Food in the Last Year: Never true  Transportation Needs: No Transportation Needs (05/15/2023)   PRAPARE - Administrator, Civil Service (Medical): No    Lack of Transportation (Non-Medical): No  Physical  Activity: Unknown (05/14/2023)   Exercise Vital Sign    Days of Exercise per Week: Patient declined    Minutes of Exercise per  Session: Not on file  Stress: No Stress Concern Present (04/20/2021)   [employer] of Occupational Health - Occupational Stress Questionnaire    Feeling of Stress : Not at all  Social Connections: Unknown (05/14/2023)   Social Connection and Isolation Panel [NHANES]    Frequency of Communication with Friends and Family: More than three times a week    Frequency of Social Gatherings with Friends and Family: Never    Attends Religious Services: Patient declined    Database administrator or Organizations: No    Attends Engineer, structural: Not on file    Marital Status: Married  Catering manager Violence: Not At Risk (04/20/2021)   Humiliation, Afraid, Rape, and Kick questionnaire    Fear of Current or Ex-Partner: No    Emotionally Abused: No    Physically Abused: No    Sexually Abused: No      Review of Systems  Respiratory:  Positive for cough.   All other systems reviewed and are negative.      Objective:   Physical Exam Vitals reviewed.  Constitutional:      General: He is not in acute distress.    Appearance: He is cachectic. He is not ill-appearing or toxic-appearing.  Cardiovascular:     Rate and Rhythm: Normal rate. Rhythm irregular.     Heart sounds: Normal heart sounds.  Pulmonary:     Effort: No tachypnea or respiratory distress.     Breath sounds: Decreased air movement present. Decreased breath sounds and rales present. No wheezing or rhonchi.  Abdominal:     General: Abdomen is flat. Bowel sounds are normal. There is no distension.     Palpations: Abdomen is soft.     Tenderness: There is no abdominal tenderness. There is no guarding or rebound.  Musculoskeletal:     Cervical back: Neck supple.     Right lower leg: No edema.     Left lower leg: No edema.  Lymphadenopathy:     Cervical: No cervical adenopathy.  Neurological:     Mental Status: He is alert.           Assessment & Plan:  Bacteremia - Plan: CBC with Differential/Platelet,  COMPLETE METABOLIC PANEL WITH GFR, Culture, blood (single) w Reflex to ID Panel  SBO (small bowel obstruction) (HCC)  Leukocytosis, unspecified type The 1 out of 2 blood cultures may have represented contamination.  I will repeat a blood culture today.  Clinically the patient appears too "good" to have untreated bacteremia in my opinion.  He also has significant leukocytosis with a white count greater than 19 but this potentially could have been reactive due to the bowel obstruction.  Repeat this today to get a baseline.  Bowel obstruction has spontaneously resolved.  Patient refused beta-blocker clinically as it was our patient on Augmentin 875 mg twice daily for possible bacteremia as well as community-acquired pneumonia.  Plan to recheck next week.  If white count is normal and blood culture is normal, may discontinue antibiotics after 1 week

## 2023-05-18 LAB — CULTURE, BLOOD (SINGLE): MICRO NUMBER:: 15193671

## 2023-05-18 LAB — CBC WITH DIFFERENTIAL/PLATELET
Basophils Absolute: 62 cells/uL (ref 0–200)
Lymphs Abs: 5289 cells/uL — ABNORMAL HIGH (ref 850–3900)
MCV: 92.1 fL (ref 80.0–100.0)
Monocytes Relative: 6.3 %
Neutro Abs: 13489 cells/uL — ABNORMAL HIGH (ref 1500–7800)
Platelets: 359 10*3/uL (ref 140–400)
RBC: 3.93 10*6/uL — ABNORMAL LOW (ref 4.20–5.80)
WBC: 20.5 10*3/uL — ABNORMAL HIGH (ref 3.8–10.8)

## 2023-05-18 LAB — CULTURE, BLOOD (ROUTINE X 2)
Culture: NO GROWTH
Special Requests: ADEQUATE

## 2023-05-18 LAB — COMPLETE METABOLIC PANEL WITH GFR
AG Ratio: 1.3 (calc) (ref 1.0–2.5)
ALT: 10 U/L (ref 9–46)
AST: 8 U/L — ABNORMAL LOW (ref 10–35)
Albumin: 3.4 g/dL — ABNORMAL LOW (ref 3.6–5.1)
Alkaline phosphatase (APISO): 90 U/L (ref 35–144)
BUN: 19 mg/dL (ref 7–25)
CO2: 32 mmol/L (ref 20–32)
Calcium: 8.5 mg/dL — ABNORMAL LOW (ref 8.6–10.3)
Chloride: 101 mmol/L (ref 98–110)
Creat: 1.13 mg/dL (ref 0.70–1.28)
Globulin: 2.6 g/dL (calc) (ref 1.9–3.7)
Glucose, Bld: 127 mg/dL — ABNORMAL HIGH (ref 65–99)
Potassium: 4.3 mmol/L (ref 3.5–5.3)
Sodium: 142 mmol/L (ref 135–146)
Total Bilirubin: 0.3 mg/dL (ref 0.2–1.2)
Total Protein: 6 g/dL — ABNORMAL LOW (ref 6.1–8.1)
eGFR: 69 mL/min/{1.73_m2} (ref >=60)

## 2023-05-23 LAB — CULTURE, BLOOD NO. 2
Micro Number: 15193672
Result:: NO GROWTH

## 2023-05-27 ENCOUNTER — Encounter: Payer: Self-pay | Admitting: Family Medicine

## 2023-05-27 ENCOUNTER — Ambulatory Visit (INDEPENDENT_AMBULATORY_CARE_PROVIDER_SITE_OTHER): Payer: PPO | Admitting: Family Medicine

## 2023-05-27 VITALS — BP 122/60 | HR 87 | Temp 97.5°F | Ht 71.0 in | Wt 128.2 lb

## 2023-05-27 DIAGNOSIS — D72829 Elevated white blood cell count, unspecified: Secondary | ICD-10-CM | POA: Diagnosis not present

## 2023-05-27 DIAGNOSIS — R7881 Bacteremia: Secondary | ICD-10-CM | POA: Diagnosis not present

## 2023-05-27 LAB — CBC WITH DIFFERENTIAL/PLATELET
Absolute Monocytes: 818 cells/uL (ref 200–950)
Basophils Absolute: 50 cells/uL (ref 0–200)
Basophils Relative: 0.4 %
Eosinophils Absolute: 87 cells/uL (ref 15–500)
Eosinophils Relative: 0.7 %
HCT: 36.3 % — ABNORMAL LOW (ref 38.5–50.0)
Hemoglobin: 11.8 g/dL — ABNORMAL LOW (ref 13.2–17.1)
Lymphs Abs: 3534 cells/uL (ref 850–3900)
MCH: 30.5 pg (ref 27.0–33.0)
MCHC: 32.5 g/dL (ref 32.0–36.0)
MCV: 93.8 fL (ref 80.0–100.0)
MPV: 10 fL (ref 7.5–12.5)
Monocytes Relative: 6.6 %
Neutro Abs: 7911 cells/uL — ABNORMAL HIGH (ref 1500–7800)
Neutrophils Relative %: 63.8 %
Platelets: 328 10*3/uL (ref 140–400)
RBC: 3.87 10*6/uL — ABNORMAL LOW (ref 4.20–5.80)
RDW: 13.9 % (ref 11.0–15.0)
Total Lymphocyte: 28.5 %
WBC: 12.4 10*3/uL — ABNORMAL HIGH (ref 3.8–10.8)

## 2023-05-27 NOTE — Progress Notes (Signed)
Subjective:    Patient ID: [name], male    DOB: [birth year], 72 y.o.   MRN: [id number]  05/17/23 Admitted 05/13/23 with SBO, sepsis, and CAP.  See H/P below for reference: "Assessment and Plan: SBO Right inguinal hernia -Patient reported nausea, vomiting abdominal pain for last 24 hours.  Last bowel movement was on 05/11/2023. - CT abdomen pelvis showed multiple dilated loops of small bowel in the abdomen, bilateral inguinal hernia containing small bowel, however an obstruction point is not well delineated due to lack of IV contrast and paucity of intra-abdominal fat.  Distal small bowel obstruction and surgery consultation is recommended. - I have reached out to general surgery Dr. Ivar Drape as multiple attempt to placement of NG tube failed in the setting of SBO.  Per general surgery small bowel obstruction possibly related to an incarcerated right inguinal hernia which was reduced on exam.  As he did not tolerated NG tube will keep NPO.  Hopefully reducing his hernia will improve his symptoms.  If he does not improve general surgery will consider small bowel protocol without any NG tube for further evaluation and possible therapeutic effect. -Currently patient is NPO. - Appreciate general surgery input.  And will follow-up with the plan. Sepsis Community-acquired pneumonia -And is tachycardic, has leukocytosis 17 and hypotensive on initial presentation.  Chest x-ray showed evidence of pneumonia.  Meets for sepsis. - Sepsis protocol has been utilized. - Giving LR 1 L followed by continue LR 125 cc/h. -In the patient was ceftriaxone 2 g once and azithromycin 500 mg IV once. -Checking lactic acid -Checking respiratory panel, blood culture, sputum culture, urine Streptococcus pneumonia antigen, urine Legionella antigen. -Pending UA -Continue ceftriaxone 2 g/day and azithromycin 500 mg IV daily. -Follow-up with culture result for appropriate antibiotic guidance. COPD  exacerbation - COPD exacerbation in the setting of pneumonia - Patient reported increased shortness of breath, productive foul-smelling cough and low-grade fever at home. - Starting methylprednisone 125 mg for 5 days can transition to p.o. later. -Continue azithromycin for 5 days. -Continue scheduled DuoNeb every 6 hour, albuterol every 4 hours as needed for wheezing shortness of breath and Mucinex twice daily. -Continue supplemental oxygen to keep O2 sat above 92% - Continued on cough spirometry, flutter valve, chest PT -Solid with respiratory therapist"  05/17/23 Patient left AMA on 7/9.  Blood cultures grew Strep Sanguinis 1/2 but patient refused to return to the hospital again [facility]. See documentation in chart.   Miraculously, he is doing quite well today.  He denies any abdominal pain.  He is not vomiting.  He has bowel sounds in all 4 quadrants.  His small dressing to resuscitate.  Although he may have bacteremia, he denies any fever.  He denies any night sweats.  He is not on any antibiotics.  I question if this may have been contamination.  At that time, my plan was: The 1 out of 2 blood cultures may have represented contamination.  I will repeat a blood culture today.  Clinically the patient appears too "good" to have untreated bacteremia in my opinion.  He also has significant leukocytosis with a white count greater than 19 but this potentially could have been reactive due to the bowel obstruction.  Repeat this today to get a baseline.  Bowel obstruction has spontaneously resolved.  Patient refused to return to the hospital AMA, so I will start him on Augmentin 875 mg twice daily for possible bacteremia as well as community-acquired pneumonia.  Plan  to recheck next week.  If white count is normal and blood culture is normal, may discontinue antibiotics after 1 week  05/27/23 Repeat blood culture was negative but CBC was significant for WBC of 20!  Patient has completed 1 week of Augmentin.  He  denies any fevers or chills.  He denies any shortness of breath beyond his baseline.  He denies any purulent sputum.  He states that he feels much better.  He denies any nausea or vomiting.  He denies any diarrhea.  Clinically he looks better than he did last time although he is still very frail and cachectic appearing Past Medical History:  Diagnosis Date   Allergy    Anemia    Anginal pain (HCC)    Arthritis    "hands"   Asthma    Back pain    4 deteriorating disc and receives an injection q3-66mon;has been doing this for about 68yrs   Blood transfusion    as a child   Bronchitis    Cancer (HCC) dx'd 2013   Non-small cell lung cancer   Chronic kidney disease    acute kidney failure post surgery   COPD (chronic obstructive pulmonary disease) (HCC)    uses Albuterol and Spiriva daily   Coronary artery disease    has 1 stent   Depression    takes Prozac daily   Emphysema    sees Dr.Ramaswami for this   Family history of adverse reaction to anesthesia    mother was hard to wake up sometimes   GERD (gastroesophageal reflux disease)    takes Prilosec daily, EGD nml 06/2017   Glaucoma    hx of   H/O hiatal hernia    Hypertension    takes  HYzaar daily   Insomnia    takes Trazodone nightly   Lung mass    right upper lobe   Myocardial infarct [facility])    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   Obesity    Parathyroid disease (HCC)    Periodic limb movements of sleep    Peripheral neuropathy    Pneumonia    hx of' 72 yo,rd' last time in 2013   Productive cough    white in color but no odor   Shortness of breath    with exertion    Sleep apnea    uses BiPaP; "no longer have sleep apnea since I have lost over 100 lbs"   Syncope and collapse 05/26/2013   Thyroid disease    Type II diabetes mellitus (HCC)    takes Metformin bid and Novolog and Lantus daily   Past Surgical History:  Procedure Laterality Date   CARDIAC CATHETERIZATION  2006/2008/2009    CARPAL TUNNEL RELEASE     bilateral   COLONOSCOPY     CORONARY ANGIOPLASTY WITH STENT PLACEMENT     1 stent   ELBOW SURGERY     ulnar nerve; left   ESOPHAGOGASTRODUODENOSCOPY     EYE SURGERY     pt. denies   FUDUCIAL PLACEMENT Left 08/24/2016   Procedure: PLACEMENT OF FUDUCIAL;  Surgeon: Loreli Slot, MD;  Location: [facility] OR;  Service: Thoracic;  Laterality: Left;   INGUINAL HERNIA REPAIR  1990's   double inguinal   LOBECTOMY  03/17/2012   upper right side with resection   MICROLARYNGOSCOPY WITH CO2 LASER AND EXCISION OF VOCAL CORD LESION N/A 06/28/2017   Procedure: MICROLARYNGOSCOPY WITH BIOPSY;  Surgeon: Christia Reading, MD;  Location:  [facility] OR;  Service: ENT;  Laterality: N/A;   MICROLARYNGOSCOPY WITH CO2 LASER AND EXCISION OF VOCAL CORD LESION N/A 07/19/2017   Procedure: MICROLARYNGOSCOPY WITH CO2 LASER AND EXCISION OF VOCAL CORD LESION;  Surgeon: Christia Reading, MD;  Location: [facility] OR;  Service: ENT;  Laterality: N/A;  MICRO DIRECT LARYNGOSCOPY WITH CO2 LASER VOCAL CORD STRIPPING/POSS JET VENTURI VENTILATION   REFRACTIVE SURGERY     bilaterally   ROTATOR CUFF REPAIR     left   VIDEO BRONCHOSCOPY WITH ENDOBRONCHIAL NAVIGATION N/A 08/24/2016   Procedure: VIDEO BRONCHOSCOPY WITH ENDOBRONCHIAL NAVIGATION;  Surgeon: Loreli Slot, MD;  Location: [facility] OR;  Service: Thoracic;  Laterality: N/A;   Current Outpatient Medications on File Prior to Visit  Medication Sig Dispense Refill   acetaminophen (TYLENOL) 500 MG tablet Take 1,000 mg by mouth every 6 (six) hours as needed for moderate pain.     amoxicillin-clavulanate (AUGMENTIN) 875-125 MG tablet Take 1 tablet by mouth 2 (two) times daily. 28 tablet 0   atorvastatin (LIPITOR) 20 MG tablet Take 1 tablet (20 mg total) by mouth daily. 90 tablet 3   Budeson-Glycopyrrol-Formoterol (BREZTRI AEROSPHERE) 160-9-4.8 MCG/ACT AERO Inhale into the lungs. 2 puffs in the morning and 2 puffs at night     diltiazem (CARDIZEM CD) 120 MG 24 hr capsule  Take 1 capsule (120 mg total) by mouth daily. 90 capsule 1   famotidine (PEPCID) 10 MG tablet Take 10 mg by mouth daily as needed for heartburn.     FLUoxetine (PROZAC) 40 MG capsule TAKE 1 CAPSULE BY MOUTH EVERY DAY (Patient taking differently: Take 40 mg by mouth daily.) 90 capsule 2   gabapentin (NEURONTIN) 300 MG capsule TAKE 1 CAPSULE BY MOUTH THREE TIMES A DAY (Patient taking differently: Take 300 mg by mouth 3 (three) times daily.) 270 capsule 2   glucose blood (ONETOUCH ULTRA) test strip TEST ONCE DAILY E11.42     ipratropium-albuterol (DUONEB) 0.5-2.5 (3) MG/3ML SOLN USE 1 VIAL VIA NEBULIZER EVERY 6 HOURS AS NEEDED (Patient taking differently: Take 3 mLs by nebulization every 6 (six) hours as needed (for shortness of breath and wheezing).) 360 mL 11   nitroGLYCERIN (NITROSTAT) 0.4 MG SL tablet Place 0.4 mg under the tongue every 5 (five) minutes as needed for chest pain.     traZODone (DESYREL) 100 MG tablet TAKE 2 TABLETS (200 MG TOTAL) BY MOUTH AT BEDTIME AS NEEDED FOR SLEEP 180 tablet 3   Current Facility-Administered Medications on File Prior to Visit  Medication Dose Route Frequency Provider Last Rate Last Admin   albuterol (PROVENTIL) (2.5 MG/3ML) 0.083% nebulizer solution 2.5 mg  2.5 mg Nebulization Once Danelle Berry, PA-C       Allergies  Allergen Reactions   Actos [Pioglitazone] Swelling    EDEMA REACTION UNSPECIFIED   Avandia [Rosiglitazone] Swelling    SWELLING REACTION UNSPECIFIED    Dilaudid [Hydromorphone Hcl] Other (See Comments)    Made him crazy Hallucinations    Social History   Socioeconomic History   Marital status: Married    Spouse name: mary lou   Number of children: 3   Years of education: trade    Highest education level: Not on file  Occupational History   Occupation: truck Hospital doctor    Comment: retired  Tobacco Use   Smoking status: Every Day    Current packs/day: 3.00    Average packs/day: 3.0 packs/day for 61.6 years (184.7 ttl pk-yrs)     Types: Cigarettes    Start date: 1963  Smokeless tobacco: Never   Tobacco comments:    1ppd as of 01/04/21  ep  Vaping Use   Vaping status: Former  Substance and Sexual Activity   Alcohol use: No    Comment: no alcolol since 2013   Drug use: Yes    Types: Cocaine, Marijuana    Comment: quit 1990's   Sexual activity: Not Currently  Other Topics Concern   Not on file  Social History Narrative   Lives in [place] with wife   She is his NOK   Was a truck driver for 37 yr, 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   Social Determinants of Health   Financial Resource Strain: Low Risk  (05/14/2023)   Overall Financial Resource Strain (CARDIA)    Difficulty of Paying Living Expenses: Not hard at all  Food Insecurity: No Food Insecurity (05/15/2023)   Hunger Vital Sign    Worried About Running Out of Food in the Last Year: Never true    Ran Out of Food in the Last Year: Never true  Transportation Needs: No Transportation Needs (05/15/2023)   PRAPARE - Administrator, Civil Service (Medical): No    Lack of Transportation (Non-Medical): No  Physical Activity: Unknown (05/14/2023)   Exercise Vital Sign    Days of Exercise per Week: Patient declined    Minutes of Exercise per Session: Not on file  Stress: No Stress Concern Present (04/20/2021)   [employer] of Occupational Health - Occupational Stress Questionnaire    Feeling of Stress : Not at all  Social Connections: Unknown (05/14/2023)   Social Connection and Isolation Panel [NHANES]    Frequency of Communication with Friends and Family: More than three times a week    Frequency of Social Gatherings with Friends and Family: Never    Attends Religious Services: Patient declined    Database administrator or Organizations: No    Attends Engineer, structural: Not on file    Marital Status: Married  Catering manager  Violence: Not At Risk (04/20/2021)   Humiliation, Afraid, Rape, and Kick questionnaire    Fear of Current or Ex-Partner: No    Emotionally Abused: No    Physically Abused: No    Sexually Abused: No      Review of Systems  Respiratory:  Positive for cough.   All other systems reviewed and are negative.      Objective:   Physical Exam Vitals reviewed.  Constitutional:      General: He is not in acute distress.    Appearance: He is cachectic. He is not ill-appearing or toxic-appearing.  Cardiovascular:     Rate and Rhythm: Normal rate. Rhythm irregular.     Heart sounds: Normal heart sounds.  Pulmonary:     Effort: No tachypnea or respiratory distress.     Breath sounds: Decreased air movement present. Decreased breath sounds and rales present. No wheezing or rhonchi.  Abdominal:     General: Abdomen is flat. Bowel sounds are normal. There is no distension.     Palpations: Abdomen is soft.     Tenderness: There is no abdominal tenderness. There is no guarding or rebound.  Musculoskeletal:     Cervical back: Neck supple.     Right lower leg: No edema.     Left lower leg: No edema.  Lymphadenopathy:  Cervical: No cervical adenopathy.  Neurological:     Mental Status: He is alert.           Assessment & Plan:  Bacteremia - Plan: CBC with Differential/Platelet  Leukocytosis, unspecified type - Plan: CBC with Differential/Platelet Patient refuses hospitalization.  He wants to focus on quality of life.  I am treating the patient currently with Augmentin.  Recheck a CBC today.  If CBC shows persistently elevated white blood cell count, may extend antibiotics for a total of 2 weeks.  If her white blood cell count is back to normal, clinically the pneumonia has resolved and I believe that we can discontinue the Augmentin.  There is residual evidence of bowel obstruction.

## 2023-05-31 ENCOUNTER — Ambulatory Visit: Payer: PPO | Admitting: Family Medicine

## 2023-06-04 DIAGNOSIS — J449 Chronic obstructive pulmonary disease, unspecified: Secondary | ICD-10-CM | POA: Diagnosis not present

## 2023-06-04 DIAGNOSIS — F17200 Nicotine dependence, unspecified, uncomplicated: Secondary | ICD-10-CM | POA: Diagnosis not present

## 2023-06-04 DIAGNOSIS — Z681 Body mass index (BMI) 19 or less, adult: Secondary | ICD-10-CM | POA: Diagnosis not present

## 2023-06-04 DIAGNOSIS — E1140 Type 2 diabetes mellitus with diabetic neuropathy, unspecified: Secondary | ICD-10-CM | POA: Diagnosis not present

## 2023-06-04 DIAGNOSIS — E1151 Type 2 diabetes mellitus with diabetic peripheral angiopathy without gangrene: Secondary | ICD-10-CM | POA: Diagnosis not present

## 2023-06-11 ENCOUNTER — Telehealth: Payer: Self-pay

## 2023-06-11 NOTE — Telephone Encounter (Signed)
Reached out to patient to set up follow up visit with provider to discuss chronic conditions.  Telephone encounter attempt : 1   No answer. [name] [facility]

## 2023-07-25 ENCOUNTER — Other Ambulatory Visit: Payer: Self-pay | Admitting: Family Medicine

## 2023-07-26 NOTE — Telephone Encounter (Signed)
Requested Prescriptions  Pending Prescriptions Disp Refills   FLUoxetine (PROZAC) 40 MG capsule [Pharmacy Med Name: FLUOXETINE HCL 40 MG CAPSULE] 90 capsule 2    Sig: TAKE 1 CAPSULE BY MOUTH EVERY DAY     Psychiatry:  Antidepressants - SSRI Failed - 07/25/2023 12:12 AM      Failed - Valid encounter within last 6 months    Recent Outpatient Visits           1 year ago Pure hypercholesterolemia   [facility] Pickard, Priscille Heidelberg, MD   1 year ago COPD exacerbation (HCC)   [facility] Donita Brooks, MD   2 years ago Chronic obstructive pulmonary disease, unspecified COPD type (HCC)   [facility] Pickard, Priscille Heidelberg, MD   2 years ago Anemia, unspecified type   [facility] Donita Brooks, MD   2 years ago Pneumonia of right lower lobe due to infectious organism   [facility] Pickard, Priscille Heidelberg, MD              Passed - Completed PHQ-2 or PHQ-9 in the last 360 days

## 2023-08-08 ENCOUNTER — Ambulatory Visit (INDEPENDENT_AMBULATORY_CARE_PROVIDER_SITE_OTHER): Payer: PPO | Admitting: Family Medicine

## 2023-08-08 VITALS — BP 102/58 | HR 68 | Ht 71.0 in | Wt 115.0 lb

## 2023-08-08 DIAGNOSIS — J9601 Acute respiratory failure with hypoxia: Secondary | ICD-10-CM | POA: Diagnosis not present

## 2023-08-08 DIAGNOSIS — C3411 Malignant neoplasm of upper lobe, right bronchus or lung: Secondary | ICD-10-CM

## 2023-08-08 DIAGNOSIS — J439 Emphysema, unspecified: Secondary | ICD-10-CM | POA: Diagnosis not present

## 2023-08-08 DIAGNOSIS — J189 Pneumonia, unspecified organism: Secondary | ICD-10-CM | POA: Diagnosis not present

## 2023-08-08 MED ORDER — CEFTRIAXONE SODIUM 1 G IJ SOLR
1.0000 g | Freq: Once | INTRAMUSCULAR | Status: AC
Start: 2023-08-08 — End: 2023-08-08
  Administered 2023-08-08: 1 g via INTRAMUSCULAR

## 2023-08-08 MED ORDER — PREDNISONE 20 MG PO TABS
ORAL_TABLET | ORAL | 0 refills | Status: DC
Start: 2023-08-08 — End: 2024-02-04

## 2023-08-08 MED ORDER — AMOXICILLIN-POT CLAVULANATE 875-125 MG PO TABS: 1.0000 | ORAL_TABLET | Freq: Two times a day (BID) | ORAL | 0 refills | Status: DC

## 2023-08-08 MED ORDER — AZITHROMYCIN 250 MG PO TABS: ORAL_TABLET | ORAL | 0 refills | Status: DC

## 2023-08-08 NOTE — Addendum Note (Signed)
Addended by: Venia Carbon K on: 08/08/2023 10:57 AM   Modules accepted: Orders

## 2023-08-08 NOTE — Progress Notes (Signed)
Subjective:    Patient ID: [name], male    DOB: Apr 04, 1951, 72 y.o.   MRN: [id number]  Patient is here today with his son.  He appears frail and cachectic.  He has increased work of breathing.  Pulse oximetry is 85% on room air.  With 2 L of oxygen on room air at rest his pulse oximetry rises to 91%.  He is unable to walk due to dyspnea.  He reports cough, he reports chest congestion, he reports wheezing.  He reports chest pain.  He refuses to go to the hospital AGAINST MEDICAL ADVICE.  I explained that he needs to go to the emergency room 3 separate times in front of his son but he absolutely refuses.  He states that he is ready to die if it comes to that but he wants to stay at home.  He reports a cough productive of thick yellow mucus and increased shortness of breath and wheezing. Past Medical History:  Diagnosis Date   Allergy    Anemia    Anginal pain (HCC)    Arthritis    "hands"   Asthma    Back pain    4 deteriorating disc and receives an injection q3-68mon;has been doing this for about 42yrs   Blood transfusion    as a child   Bronchitis    Cancer (HCC) dx'd 2013   Non-small cell lung cancer   Chronic kidney disease    acute kidney failure post surgery   COPD (chronic obstructive pulmonary disease) (HCC)    uses Albuterol and Spiriva daily   Coronary artery disease    has 1 stent   Depression    takes Prozac daily   Emphysema    sees Dr.Ramaswami for this   Family history of adverse reaction to anesthesia    mother was hard to wake up sometimes   GERD (gastroesophageal reflux disease)    takes Prilosec daily, EGD nml 06/2017   Glaucoma    hx of   H/O hiatal hernia    Hypertension    takes  HYzaar daily   Insomnia    takes Trazodone nightly   Lung mass    right upper lobe   Myocardial infarct [facility])    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   Obesity    Parathyroid disease (HCC)    Periodic limb movements of sleep     Peripheral neuropathy    Pneumonia    hx of' 72 yo,rd' last time in 2013   Productive cough    white in color but no odor   Shortness of breath    with exertion    Sleep apnea    uses BiPaP; "no longer have sleep apnea since I have lost over 100 lbs"   Syncope and collapse 05/26/2013   Thyroid disease    Type II diabetes mellitus (HCC)    takes Metformin bid and Novolog and Lantus daily   Past Surgical History:  Procedure Laterality Date   CARDIAC CATHETERIZATION  2006/2008/2009   CARPAL TUNNEL RELEASE     bilateral   COLONOSCOPY     CORONARY ANGIOPLASTY WITH STENT PLACEMENT     1 stent   ELBOW SURGERY     ulnar nerve; left   ESOPHAGOGASTRODUODENOSCOPY     EYE SURGERY     pt. denies   FUDUCIAL PLACEMENT Left 08/24/2016   Procedure: PLACEMENT OF FUDUCIAL;  Surgeon: Loreli Slot, MD;  Location: [facility] OR;  Service: Thoracic;  Laterality: Left;   INGUINAL HERNIA REPAIR  1990's   double inguinal   LOBECTOMY  03/17/2012   upper right side with resection   MICROLARYNGOSCOPY WITH CO2 LASER AND EXCISION OF VOCAL CORD LESION N/A 06/28/2017   Procedure: MICROLARYNGOSCOPY WITH BIOPSY;  Surgeon: Christia Reading, MD;  Location: [facility] OR;  Service: ENT;  Laterality: N/A;   MICROLARYNGOSCOPY WITH CO2 LASER AND EXCISION OF VOCAL CORD LESION N/A 07/19/2017   Procedure: MICROLARYNGOSCOPY WITH CO2 LASER AND EXCISION OF VOCAL CORD LESION;  Surgeon: Christia Reading, MD;  Location: [facility] OR;  Service: ENT;  Laterality: N/A;  MICRO DIRECT LARYNGOSCOPY WITH CO2 LASER VOCAL CORD STRIPPING/POSS JET VENTURI VENTILATION   REFRACTIVE SURGERY     bilaterally   ROTATOR CUFF REPAIR     left   VIDEO BRONCHOSCOPY WITH ENDOBRONCHIAL NAVIGATION N/A 08/24/2016   Procedure: VIDEO BRONCHOSCOPY WITH ENDOBRONCHIAL NAVIGATION;  Surgeon: Loreli Slot, MD;  Location: [facility] OR;  Service: Thoracic;  Laterality: N/A;   Current Outpatient Medications on File Prior to Visit  Medication Sig Dispense Refill   acetaminophen  (TYLENOL) 500 MG tablet Take 1,000 mg by mouth every 6 (six) hours as needed for moderate pain.     amoxicillin-clavulanate (AUGMENTIN) 875-125 MG tablet Take 1 tablet by mouth 2 (two) times daily. 28 tablet 0   atorvastatin (LIPITOR) 20 MG tablet Take 1 tablet (20 mg total) by mouth daily. 90 tablet 3   Budeson-Glycopyrrol-Formoterol (BREZTRI AEROSPHERE) 160-9-4.8 MCG/ACT AERO Inhale into the lungs. 2 puffs in the morning and 2 puffs at night     famotidine (PEPCID) 10 MG tablet Take 10 mg by mouth daily as needed for heartburn.     FLUoxetine (PROZAC) 40 MG capsule TAKE 1 CAPSULE BY MOUTH EVERY DAY 90 capsule 1   gabapentin (NEURONTIN) 300 MG capsule TAKE 1 CAPSULE BY MOUTH THREE TIMES A DAY (Patient taking differently: Take 300 mg by mouth 3 (three) times daily.) 270 capsule 2   glucose blood (ONETOUCH ULTRA) test strip TEST ONCE DAILY E11.42     ipratropium-albuterol (DUONEB) 0.5-2.5 (3) MG/3ML SOLN USE 1 VIAL VIA NEBULIZER EVERY 6 HOURS AS NEEDED (Patient taking differently: Take 3 mLs by nebulization every 6 (six) hours as needed (for shortness of breath and wheezing).) 360 mL 11   nitroGLYCERIN (NITROSTAT) 0.4 MG SL tablet Place 0.4 mg under the tongue every 5 (five) minutes as needed for chest pain.     traZODone (DESYREL) 100 MG tablet TAKE 2 TABLETS (200 MG TOTAL) BY MOUTH AT BEDTIME AS NEEDED FOR SLEEP 180 tablet 3   diltiazem (CARDIZEM CD) 120 MG 24 hr capsule Take 1 capsule (120 mg total) by mouth daily. (Patient not taking: Reported on 05/27/2023) 90 capsule 1   Current Facility-Administered Medications on File Prior to Visit  Medication Dose Route Frequency Provider Last Rate Last Admin   albuterol (PROVENTIL) (2.5 MG/3ML) 0.083% nebulizer solution 2.5 mg  2.5 mg Nebulization Once Danelle Berry, PA-C       Allergies  Allergen Reactions   Actos [Pioglitazone] Swelling    EDEMA REACTION UNSPECIFIED   Avandia [Rosiglitazone] Swelling    SWELLING REACTION UNSPECIFIED    Dilaudid  [Hydromorphone Hcl] Other (See Comments)    Made him crazy Hallucinations    Social History   Socioeconomic History   Marital status: Married    Spouse name: mary lou   Number of children: 3   Years of education: trade    Highest education  level: Not on file  Occupational History   Occupation: truck driver    Comment: retired  Tobacco Use   Smoking status: Every Day    Current packs/day: 3.00    Average packs/day: 3.0 packs/day for 61.8 years (185.3 ttl pk-yrs)    Types: Cigarettes    Start date: 1963   Smokeless tobacco: Never   Tobacco comments:    1ppd as of 01/04/21  ep  Vaping Use   Vaping status: Former  Substance and Sexual Activity   Alcohol use: No    Comment: no alcolol since 2013   Drug use: Yes    Types: Cocaine, Marijuana    Comment: quit 1990's   Sexual activity: Not Currently  Other Topics Concern   Not on file  Social History Narrative   Lives in [place] with wife   She is his NOK   Was a truck driver for 37 yr, 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   Social Determinants of Health   Financial Resource Strain: Low Risk  (05/14/2023)   Overall Financial Resource Strain (CARDIA)    Difficulty of Paying Living Expenses: Not hard at all  Food Insecurity: No Food Insecurity (05/15/2023)   Hunger Vital Sign    Worried About Running Out of Food in the Last Year: Never true    Ran Out of Food in the Last Year: Never true  Transportation Needs: No Transportation Needs (05/15/2023)   PRAPARE - Administrator, Civil Service (Medical): No    Lack of Transportation (Non-Medical): No  Physical Activity: Unknown (05/14/2023)   Exercise Vital Sign    Days of Exercise per Week: Patient declined    Minutes of Exercise per Session: Not on file  Stress: No Stress Concern Present (04/20/2021)   [employer] of Occupational Health - Occupational Stress  Questionnaire    Feeling of Stress : Not at all  Social Connections: Unknown (05/14/2023)   Social Connection and Isolation Panel [NHANES]    Frequency of Communication with Friends and Family: More than three times a week    Frequency of Social Gatherings with Friends and Family: Never    Attends Religious Services: Patient declined    Database administrator or Organizations: No    Attends Engineer, structural: Not on file    Marital Status: Married  Catering manager Violence: Not At Risk (04/20/2021)   Humiliation, Afraid, Rape, and Kick questionnaire    Fear of Current or Ex-Partner: No    Emotionally Abused: No    Physically Abused: No    Sexually Abused: No      Review of Systems  Respiratory:  Positive for cough.   All other systems reviewed and are negative.      Objective:   Physical Exam Vitals reviewed.  Constitutional:      General: He is not in acute distress.    Appearance: He is cachectic. He is not ill-appearing or toxic-appearing.  Cardiovascular:     Rate and Rhythm: Normal rate. Rhythm irregular.     Heart sounds: Normal heart sounds.  Pulmonary:     Effort: Tachypnea present.     Breath sounds: Decreased air movement present. Examination of the left-middle field reveals rhonchi and rales. Examination of the left-lower field reveals rhonchi and rales. Decreased breath sounds, wheezing, rhonchi and rales present.  Abdominal:  General: Abdomen is flat. Bowel sounds are normal. There is no distension.     Palpations: Abdomen is soft.     Tenderness: There is no abdominal tenderness. There is no guarding or rebound.  Musculoskeletal:     Cervical back: Neck supple.     Right lower leg: No edema.     Left lower leg: No edema.  Lymphadenopathy:     Cervical: No cervical adenopathy.  Neurological:     Mental Status: He is alert.           Assessment & Plan:  Pulmonary emphysema, unspecified emphysema type (HCC) - Plan: CANCELED: For home  use only DME oxygen  Non-small cell carcinoma of lung, stage 1 (HCC) - Plan: CANCELED: For home use only DME oxygen  Acute respiratory failure with hypoxia (HCC) - Plan: cefTRIAXone (ROCEPHIN) injection 1 g  Pneumonia of left lung due to infectious organism, unspecified part of lung Patient is hypotensive.  He has increased work of breathing.  He is hypoxic.  Son states that he has been unable to eat or drink anything for the last 3 to 4 days.  He is unable to swallow.  I explained to the patient he needs to go to the hospital.  He refuses all hospitalization AGAINST MEDICAL ADVICE.  He wants me to try to treat him as best I can.  He is willing to die if it comes to that but he wants to be at home.  Therefore I will start the patient on 2 L of oxygen via nasal cannula.  I will give the patient 1 g of Rocephin for what I suspect is left lower lobe pneumonia.  Treat the patient with Augmentin 875 mg twice daily for 10 days and a Z-Pak.  Start prednisone taper pack for COPD exacerbation given the wheezing and recommended albuterol nebulizer treatment every 6 hours.  I believe the patient's prognosis is grim.  However, I promised to do what I can to help try to treat his condition as an outpatient.  Both the patient and the son realize he needs to be in the hospital, but that is not the patient's wishes.  I will respect that

## 2023-09-25 DIAGNOSIS — J168 Pneumonia due to other specified infectious organisms: Secondary | ICD-10-CM | POA: Diagnosis not present

## 2023-09-25 DIAGNOSIS — J9601 Acute respiratory failure with hypoxia: Secondary | ICD-10-CM | POA: Diagnosis not present

## 2023-09-25 DIAGNOSIS — J439 Emphysema, unspecified: Secondary | ICD-10-CM | POA: Diagnosis not present

## 2023-10-07 ENCOUNTER — Encounter: Payer: Self-pay | Admitting: Family Medicine

## 2023-10-07 ENCOUNTER — Ambulatory Visit (INDEPENDENT_AMBULATORY_CARE_PROVIDER_SITE_OTHER): Payer: PPO | Admitting: Family Medicine

## 2023-10-07 VITALS — BP 138/68 | HR 70 | Temp 98.7°F | Ht 71.0 in | Wt 135.6 lb

## 2023-10-07 DIAGNOSIS — J439 Emphysema, unspecified: Secondary | ICD-10-CM | POA: Diagnosis not present

## 2023-10-07 DIAGNOSIS — C3411 Malignant neoplasm of upper lobe, right bronchus or lung: Secondary | ICD-10-CM

## 2023-10-07 DIAGNOSIS — Z902 Acquired absence of lung [part of]: Secondary | ICD-10-CM

## 2023-10-07 DIAGNOSIS — C320 Malignant neoplasm of glottis: Secondary | ICD-10-CM

## 2023-10-07 MED ORDER — BREZTRI AEROSPHERE 160-9-4.8 MCG/ACT IN AERO: 2.0000 | INHALATION_SPRAY | Freq: Two times a day (BID) | RESPIRATORY_TRACT | 11 refills | Status: DC

## 2023-10-07 MED ORDER — OXYCODONE-ACETAMINOPHEN 5-325 MG PO TABS: 1.0000 | ORAL_TABLET | ORAL | 0 refills | Status: AC | PRN

## 2023-10-07 NOTE — Progress Notes (Signed)
Wt Readings from Last 3 Encounters:  10/07/23 135 lb 9.6 oz (61.5 kg)  08/08/23 115 lb (52.2 kg)  05/27/23 128 lb 3.2 oz (58.2 kg)      Subjective:    Patient ID: [name], male    DOB: Sep 28, 1951, 72 y.o.   MRN: [id number] The last 2 times I saw this patient, he appeared to be on the verge of respiratory collapse.  However, fortunately, the patient recovered both instances.  He has severe obstructive lung disease.  He is not on any daily maintenance medication.  He was supposed to be taking Breztri 2 puffs twice daily but he stopped this.  We had a long discussion today about this medication.  I explained that this can help prevent future COPD exacerbations and improve overall breathing and shortness of breath.  He is willing to try this medication.  He has not had his flu shot.  He has not had his COVID shot.  Fortunately, since I last saw the patient, he has gained weight.  He is eating better.  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  He would like a refill on his pain medication.  Patient uses Percocet sparingly when he develops pain in his throat or his chest due to severe coughing spasms Past Medical History:  Diagnosis Date   Allergy    Anemia    Anginal pain (HCC)    Arthritis    "hands"   Asthma    Back pain    4 deteriorating disc and receives an injection q3-72mon;has been doing this for about 28yrs   Blood transfusion    as a child   Bronchitis    Cancer (HCC) dx'd 2013   Non-small cell lung cancer   Chronic kidney disease    acute kidney failure post surgery   COPD (chronic obstructive pulmonary disease) (HCC)    uses Albuterol and Spiriva daily   Coronary artery disease    has 1 stent   Depression    takes Prozac daily   Emphysema    sees Dr.Ramaswami for this   Family history of adverse reaction to anesthesia    mother was hard to wake up sometimes   GERD (gastroesophageal reflux disease)    takes Prilosec daily, EGD nml  06/2017   Glaucoma    hx of   H/O hiatal hernia    Hypertension    takes  HYzaar daily   Insomnia    takes Trazodone nightly   Lung mass    right upper lobe   Myocardial infarct [facility])    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   Obesity    Parathyroid disease (HCC)    Periodic limb movements of sleep    Peripheral neuropathy    Pneumonia    hx of' 72 yo,rd' last time in 2013   Productive cough    white in color but no odor   Shortness of breath    with exertion    Sleep apnea    uses BiPaP; "no longer have sleep apnea since I have lost over 100 lbs"   Syncope and collapse 05/26/2013   Thyroid disease    Type II diabetes mellitus (HCC)    takes Metformin bid and Novolog and Lantus daily   Past Surgical History:  Procedure Laterality Date   CARDIAC CATHETERIZATION  2006/2008/2009   CARPAL TUNNEL RELEASE     bilateral   COLONOSCOPY  CORONARY ANGIOPLASTY WITH STENT PLACEMENT     1 stent   ELBOW SURGERY     ulnar nerve; left   ESOPHAGOGASTRODUODENOSCOPY     EYE SURGERY     pt. denies   FUDUCIAL PLACEMENT Left 08/24/2016   Procedure: PLACEMENT OF FUDUCIAL;  Surgeon: Loreli Slot, MD;  Location: [facility] OR;  Service: Thoracic;  Laterality: Left;   INGUINAL HERNIA REPAIR  1990's   double inguinal   LOBECTOMY  03/17/2012   upper right side with resection   MICROLARYNGOSCOPY WITH CO2 LASER AND EXCISION OF VOCAL CORD LESION N/A 06/28/2017   Procedure: MICROLARYNGOSCOPY WITH BIOPSY;  Surgeon: Christia Reading, MD;  Location: [facility] OR;  Service: ENT;  Laterality: N/A;   MICROLARYNGOSCOPY WITH CO2 LASER AND EXCISION OF VOCAL CORD LESION N/A 07/19/2017   Procedure: MICROLARYNGOSCOPY WITH CO2 LASER AND EXCISION OF VOCAL CORD LESION;  Surgeon: Christia Reading, MD;  Location: [facility] OR;  Service: ENT;  Laterality: N/A;  MICRO DIRECT LARYNGOSCOPY WITH CO2 LASER VOCAL CORD STRIPPING/POSS JET VENTURI VENTILATION   REFRACTIVE SURGERY     bilaterally   ROTATOR CUFF REPAIR      left   VIDEO BRONCHOSCOPY WITH ENDOBRONCHIAL NAVIGATION N/A 08/24/2016   Procedure: VIDEO BRONCHOSCOPY WITH ENDOBRONCHIAL NAVIGATION;  Surgeon: Loreli Slot, MD;  Location: [facility] OR;  Service: Thoracic;  Laterality: N/A;   Current Outpatient Medications on File Prior to Visit  Medication Sig Dispense Refill   acetaminophen (TYLENOL) 500 MG tablet Take 1,000 mg by mouth every 6 (six) hours as needed for moderate pain.     atorvastatin (LIPITOR) 20 MG tablet Take 1 tablet (20 mg total) by mouth daily. 90 tablet 3   famotidine (PEPCID) 10 MG tablet Take 10 mg by mouth daily as needed for heartburn.     FLUoxetine (PROZAC) 40 MG capsule TAKE 1 CAPSULE BY MOUTH EVERY DAY 90 capsule 1   gabapentin (NEURONTIN) 300 MG capsule TAKE 1 CAPSULE BY MOUTH THREE TIMES A DAY (Patient taking differently: Take 300 mg by mouth 3 (three) times daily.) 270 capsule 2   glucose blood (ONETOUCH ULTRA) test strip TEST ONCE DAILY E11.42     ipratropium-albuterol (DUONEB) 0.5-2.5 (3) MG/3ML SOLN USE 1 VIAL VIA NEBULIZER EVERY 6 HOURS AS NEEDED (Patient taking differently: Take 3 mLs by nebulization every 6 (six) hours as needed (for shortness of breath and wheezing).) 360 mL 11   nitroGLYCERIN (NITROSTAT) 0.4 MG SL tablet Place 0.4 mg under the tongue every 5 (five) minutes as needed for chest pain.     predniSONE (DELTASONE) 20 MG tablet 3 tabs poqday 1-2, 2 tabs poqday 3-4, 1 tab poqday 5-6 12 tablet 0   traZODone (DESYREL) 100 MG tablet TAKE 2 TABLETS (200 MG TOTAL) BY MOUTH AT BEDTIME AS NEEDED FOR SLEEP 180 tablet 3   amoxicillin-clavulanate (AUGMENTIN) 875-125 MG tablet Take 1 tablet by mouth 2 (two) times daily. (Patient not taking: Reported on 10/07/2023) 28 tablet 0   amoxicillin-clavulanate (AUGMENTIN) 875-125 MG tablet Take 1 tablet by mouth 2 (two) times daily. (Patient not taking: Reported on 10/07/2023) 20 tablet 0   azithromycin (ZITHROMAX) 250 MG tablet 2 tabs poqday1, 1 tab poqday 2-5 (Patient not  taking: Reported on 10/07/2023) 6 tablet 0   diltiazem (CARDIZEM CD) 120 MG 24 hr capsule Take 1 capsule (120 mg total) by mouth daily. (Patient not taking: Reported on 05/27/2023) 90 capsule 1   Current Facility-Administered Medications on File Prior to Visit  Medication Dose Route Frequency Provider Last  Rate Last Admin   albuterol (PROVENTIL) (2.5 MG/3ML) 0.083% nebulizer solution 2.5 mg  2.5 mg Nebulization Once Danelle Berry, PA-C       Allergies  Allergen Reactions   Actos [Pioglitazone] Swelling    EDEMA REACTION UNSPECIFIED   Avandia [Rosiglitazone] Swelling    SWELLING REACTION UNSPECIFIED    Dilaudid [Hydromorphone Hcl] Other (See Comments)    Made him crazy Hallucinations    Social History   Socioeconomic History   Marital status: Married    Spouse name: mary lou   Number of children: 3   Years of education: trade    Highest education level: Not on file  Occupational History   Occupation: truck Hospital doctor    Comment: retired  Tobacco Use   Smoking status: Every Day    Current packs/day: 3.00    Average packs/day: 3.0 packs/day for 61.9 years (185.8 ttl pk-yrs)    Types: Cigarettes    Start date: 1963   Smokeless tobacco: Never   Tobacco comments:    1ppd as of 01/04/21  ep  Vaping Use   Vaping status: Former  Substance and Sexual Activity   Alcohol use: No    Comment: no alcolol since 2013   Drug use: Yes    Types: Cocaine, Marijuana    Comment: quit 1990's   Sexual activity: Not Currently  Other Topics Concern   Not on file  Social History Narrative   Lives in [place] with wife   She is his NOK   Was a truck driver for 37 yr, 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   Social Determinants of Health   Financial Resource Strain: Low Risk  (05/14/2023)   Overall Financial Resource Strain (CARDIA)    Difficulty of Paying Living Expenses: Not hard at all  Food  Insecurity: No Food Insecurity (05/15/2023)   Hunger Vital Sign    Worried About Running Out of Food in the Last Year: Never true    Ran Out of Food in the Last Year: Never true  Transportation Needs: No Transportation Needs (05/15/2023)   PRAPARE - Administrator, Civil Service (Medical): No    Lack of Transportation (Non-Medical): No  Physical Activity: Unknown (05/14/2023)   Exercise Vital Sign    Days of Exercise per Week: Patient declined    Minutes of Exercise per Session: Not on file  Stress: No Stress Concern Present (04/20/2021)   [employer] of Occupational Health - Occupational Stress Questionnaire    Feeling of Stress : Not at all  Social Connections: Unknown (05/14/2023)   Social Connection and Isolation Panel [NHANES]    Frequency of Communication with Friends and Family: More than three times a week    Frequency of Social Gatherings with Friends and Family: Never    Attends Religious Services: Patient declined    Database administrator or Organizations: No    Attends Engineer, structural: Not on file    Marital Status: Married  Catering manager Violence: Not At Risk (04/20/2021)   Humiliation, Afraid, Rape, and Kick questionnaire    Fear of Current or Ex-Partner: No    Emotionally Abused: No    Physically Abused: No    Sexually Abused: No      Review of Systems  Respiratory:  Positive for cough.   All other systems reviewed and are negative.  Objective:   Physical Exam Vitals reviewed.  Constitutional:      General: He is not in acute distress.    Appearance: He is cachectic. He is not ill-appearing or toxic-appearing.  Cardiovascular:     Rate and Rhythm: Normal rate. Rhythm irregular.     Heart sounds: Normal heart sounds.  Pulmonary:     Effort: Pulmonary effort is normal. No tachypnea.     Breath sounds: Decreased air movement present. Decreased breath sounds present. No wheezing, rhonchi or rales.  Abdominal:     General:  Abdomen is flat. Bowel sounds are normal. There is no distension.     Palpations: Abdomen is soft.     Tenderness: There is no abdominal tenderness. There is no guarding or rebound.  Musculoskeletal:     Cervical back: Neck supple.     Right lower leg: No edema.     Left lower leg: No edema.  Lymphadenopathy:     Cervical: No cervical adenopathy.  Neurological:     Mental Status: He is alert.           Assessment & Plan:  Pulmonary emphysema, unspecified emphysema type (HCC) - Plan: CBC with Differential/Platelet, COMPLETE METABOLIC PANEL WITH GFR  S/P lobectomy of lung  Non-small cell carcinoma of lung, stage 1 (HCC)  Primary cancer of glottis (HCC) I explained to the patient that I want him to get a flu shot and a COVID shot soon as possible.  We need to focus on prevention.  I explained that his respiratory status is tenuous.  Therefore I want him to take Breztri 2 puffs twice daily to help prevent COPD exacerbations and improve his respiratory function.  I will also check a CBC a CMP to monitor his electrolytes.  I gave the patient a 1 month sample of Breztri.  Also refilled his oxycodone that he uses sparingly for chest pain and throat pain

## 2023-10-08 LAB — COMPLETE METABOLIC PANEL WITH GFR
AG Ratio: 1.3 (calc) (ref 1.0–2.5)
ALT: 9 U/L (ref 9–46)
AST: 10 U/L (ref 10–35)
Albumin: 3.7 g/dL (ref 3.6–5.1)
Alkaline phosphatase (APISO): 89 U/L (ref 35–144)
BUN: 19 mg/dL (ref 7–25)
CO2: 30 mmol/L (ref 20–32)
Calcium: 8.5 mg/dL — ABNORMAL LOW (ref 8.6–10.3)
Chloride: 102 mmol/L (ref 98–110)
Creat: 0.9 mg/dL (ref 0.70–1.28)
Globulin: 2.8 g/dL (ref 1.9–3.7)
Glucose, Bld: 100 mg/dL — ABNORMAL HIGH (ref 65–99)
Potassium: 4 mmol/L (ref 3.5–5.3)
Sodium: 141 mmol/L (ref 135–146)
Total Bilirubin: 0.3 mg/dL (ref 0.2–1.2)
Total Protein: 6.5 g/dL (ref 6.1–8.1)
eGFR: 90 mL/min/{1.73_m2} (ref >=60)

## 2023-10-08 LAB — CBC WITH DIFFERENTIAL/PLATELET
Absolute Lymphocytes: 5324 {cells}/uL — ABNORMAL HIGH (ref 850–3900)
Absolute Monocytes: 918 {cells}/uL (ref 200–950)
Basophils Absolute: 46 {cells}/uL (ref 0–200)
Basophils Relative: 0.3 %
Eosinophils Absolute: 184 {cells}/uL (ref 15–500)
Eosinophils Relative: 1.2 %
HCT: 36.6 % — ABNORMAL LOW (ref 38.5–50.0)
Hemoglobin: 11.7 g/dL — ABNORMAL LOW (ref 13.2–17.1)
MCH: 29.9 pg (ref 27.0–33.0)
MCHC: 32 g/dL (ref 32.0–36.0)
MCV: 93.6 fL (ref 80.0–100.0)
MPV: 10.6 fL (ref 7.5–12.5)
Monocytes Relative: 6 %
Neutro Abs: 8828 {cells}/uL — ABNORMAL HIGH (ref 1500–7800)
Neutrophils Relative %: 57.7 %
Platelets: 314 10*3/uL (ref 140–400)
RBC: 3.91 10*6/uL — ABNORMAL LOW (ref 4.20–5.80)
RDW: 13.3 % (ref 11.0–15.0)
Total Lymphocyte: 34.8 %
WBC: 15.3 10*3/uL — ABNORMAL HIGH (ref 3.8–10.8)

## 2023-10-09 ENCOUNTER — Telehealth: Payer: Self-pay

## 2023-10-09 NOTE — Telephone Encounter (Signed)
Disregard, msg was in pts lab note results.

## 2023-10-25 DIAGNOSIS — J9601 Acute respiratory failure with hypoxia: Secondary | ICD-10-CM | POA: Diagnosis not present

## 2023-10-25 DIAGNOSIS — J168 Pneumonia due to other specified infectious organisms: Secondary | ICD-10-CM | POA: Diagnosis not present

## 2023-10-25 DIAGNOSIS — J439 Emphysema, unspecified: Secondary | ICD-10-CM | POA: Diagnosis not present

## 2023-11-12 ENCOUNTER — Other Ambulatory Visit: Payer: Self-pay | Admitting: Internal Medicine

## 2023-11-12 DIAGNOSIS — J42 Unspecified chronic bronchitis: Secondary | ICD-10-CM

## 2023-11-13 ENCOUNTER — Other Ambulatory Visit: Payer: Self-pay

## 2023-11-13 NOTE — Telephone Encounter (Signed)
 Prescription Request  11/13/2023  LOV: 10/07/23  What is the name of the medication or equipment? gabapentin  (NEURONTIN ) 300 MG capsule [[id number]]   Have you contacted your pharmacy to request a refill? Yes   Which pharmacy would you like this sent to?  [id number] - [place], [place] - [address] AT [facility] OF [address] [place] [address] Phone: [phone] Fax: [phone]    Patient notified that their request is being sent to the clinical staff for review and that they should receive a response within 2 business days.   Please advise at [facility] [phone]

## 2023-11-15 MED ORDER — GABAPENTIN 300 MG PO CAPS: ORAL_CAPSULE | ORAL | 3 refills | Status: DC

## 2023-11-15 NOTE — Telephone Encounter (Signed)
 Requested Prescriptions  Pending Prescriptions Disp Refills   gabapentin  (NEURONTIN ) 300 MG capsule 270 capsule 2    Sig: TAKE 1 CAPSULE BY MOUTH THREE TIMES A DAY     Neurology: Anticonvulsants - gabapentin  Failed - 11/15/2023  8:25 PM      Failed - Valid encounter within last 12 months    Recent Outpatient Visits           1 year ago Pure hypercholesterolemia   [facility] Pickard, Butler DASEN, MD   2 years ago COPD exacerbation (HCC)   [facility] Duanne Butler DASEN, MD   2 years ago Chronic obstructive pulmonary disease, unspecified COPD type (HCC)   [facility] Pickard, Butler DASEN, MD   2 years ago Anemia, unspecified type   [facility] Duanne Butler DASEN, MD   2 years ago Pneumonia of right lower lobe due to infectious organism   [facility] Pickard, Butler DASEN, MD       Future Appointments             In 1 month Skains, Oneil BROCKS, MD [facility]            Passed - Cr in normal range and within 360 days    Creatinine  Date Value Ref Range Status  11/04/2017 1.4 (H) 0.7 - 1.3 mg/dL Final   Creat  Date Value Ref Range Status  10/07/2023 0.90 0.70 - 1.28 mg/dL Final   Creatinine, Urine  Date Value Ref Range Status  08/23/2022 70 20 - 320 mg/dL Final  89/80/7976 70 20 - 320 mg/dL Final         Passed - Completed PHQ-2 or PHQ-9 in the last 360 days

## 2023-11-25 DIAGNOSIS — J168 Pneumonia due to other specified infectious organisms: Secondary | ICD-10-CM | POA: Diagnosis not present

## 2023-11-25 DIAGNOSIS — J9601 Acute respiratory failure with hypoxia: Secondary | ICD-10-CM | POA: Diagnosis not present

## 2023-11-25 DIAGNOSIS — J439 Emphysema, unspecified: Secondary | ICD-10-CM | POA: Diagnosis not present

## 2023-12-13 ENCOUNTER — Other Ambulatory Visit: Payer: Self-pay | Admitting: Family Medicine

## 2023-12-26 DIAGNOSIS — J168 Pneumonia due to other specified infectious organisms: Secondary | ICD-10-CM | POA: Diagnosis not present

## 2023-12-26 DIAGNOSIS — J9601 Acute respiratory failure with hypoxia: Secondary | ICD-10-CM | POA: Diagnosis not present

## 2023-12-26 DIAGNOSIS — J439 Emphysema, unspecified: Secondary | ICD-10-CM | POA: Diagnosis not present

## 2024-01-03 ENCOUNTER — Ambulatory Visit: Payer: PPO | Admitting: Cardiology

## 2024-01-13 ENCOUNTER — Telehealth: Payer: Self-pay

## 2024-01-13 NOTE — Telephone Encounter (Signed)
 Copied from CRM [id number]. Topic: Referral - Request for Referral >> Jan 13, 2024  8:31 AM [name] wrote: Did the patient discuss referral with their provider in the last year? No (If No - schedule appointment) (If Yes - send message)  Appointment offered? No as it is next to impossible to get him to any appt  Type of order/referral and detailed reason for visit: The spouse has called in requesting a referral to restart the hospice care or palliative care as he has regressed  Preference of office, provider, location: [facility] off of [address]  If referral order, have you been seen by this specialty before? Yes (If Yes, this issue or another issue? When? Where?  Can we respond through MyChart? No please call if possible to let the wife know this referral is being processed. She says he is up to 4 liters of oxygen and struggling really bad so this referral needs to be processed rather urgently. The patient states he will not go to the hospital.

## 2024-01-14 ENCOUNTER — Other Ambulatory Visit: Payer: Self-pay

## 2024-01-14 DIAGNOSIS — J441 Chronic obstructive pulmonary disease with (acute) exacerbation: Secondary | ICD-10-CM

## 2024-01-14 DIAGNOSIS — J439 Emphysema, unspecified: Secondary | ICD-10-CM

## 2024-01-14 DIAGNOSIS — C3411 Malignant neoplasm of upper lobe, right bronchus or lung: Secondary | ICD-10-CM

## 2024-01-27 ENCOUNTER — Telehealth: Payer: Self-pay

## 2024-01-27 NOTE — Telephone Encounter (Signed)
 Copied from CRM [id number]. Topic: Clinical - Medical Advice >> Jan 27, 2024  8:37 AM Franchot Heidelberg wrote: Reason for CRM: Pt's wife called requesting to speak to Germaine Pomfret as soon as possible. Declined to disclose any further information. Patient deceased 06-Mar-2024 night.   Best contact: [phone]

## 2024-02-04 DEATH — deceased

## 2024-02-27 ENCOUNTER — Ambulatory Visit: Payer: PPO | Admitting: Nurse Practitioner
